# Patient Record
Sex: Female | Born: 1955 | Race: Black or African American | Hispanic: No | Smoking: Current every day smoker
Health system: Southern US, Community
[De-identification: ages and names within clinical notes are randomized; demographics above are authoritative.]

## PROBLEM LIST (undated history)

## (undated) DIAGNOSIS — I82409 Acute embolism and thrombosis of unspecified deep veins of unspecified lower extremity: Secondary | ICD-10-CM

## (undated) DIAGNOSIS — M549 Dorsalgia, unspecified: Secondary | ICD-10-CM

## (undated) DIAGNOSIS — I493 Ventricular premature depolarization: Secondary | ICD-10-CM

## (undated) DIAGNOSIS — F329 Major depressive disorder, single episode, unspecified: Secondary | ICD-10-CM

## (undated) DIAGNOSIS — I639 Cerebral infarction, unspecified: Secondary | ICD-10-CM

## (undated) DIAGNOSIS — D259 Leiomyoma of uterus, unspecified: Secondary | ICD-10-CM

## (undated) DIAGNOSIS — F32A Depression, unspecified: Secondary | ICD-10-CM

## (undated) DIAGNOSIS — M1990 Unspecified osteoarthritis, unspecified site: Secondary | ICD-10-CM

## (undated) DIAGNOSIS — Z720 Tobacco use: Secondary | ICD-10-CM

## (undated) DIAGNOSIS — N83209 Unspecified ovarian cyst, unspecified side: Secondary | ICD-10-CM

## (undated) DIAGNOSIS — G8929 Other chronic pain: Secondary | ICD-10-CM

## (undated) DIAGNOSIS — F419 Anxiety disorder, unspecified: Secondary | ICD-10-CM

## (undated) DIAGNOSIS — I2699 Other pulmonary embolism without acute cor pulmonale: Secondary | ICD-10-CM

## (undated) DIAGNOSIS — K219 Gastro-esophageal reflux disease without esophagitis: Secondary | ICD-10-CM

## (undated) DIAGNOSIS — I10 Essential (primary) hypertension: Secondary | ICD-10-CM

## (undated) DIAGNOSIS — G4733 Obstructive sleep apnea (adult) (pediatric): Secondary | ICD-10-CM

## (undated) DIAGNOSIS — I350 Nonrheumatic aortic (valve) stenosis: Secondary | ICD-10-CM

## (undated) DIAGNOSIS — G43909 Migraine, unspecified, not intractable, without status migrainosus: Secondary | ICD-10-CM

## (undated) DIAGNOSIS — J449 Chronic obstructive pulmonary disease, unspecified: Secondary | ICD-10-CM

## (undated) DIAGNOSIS — C189 Malignant neoplasm of colon, unspecified: Secondary | ICD-10-CM

## (undated) DIAGNOSIS — E785 Hyperlipidemia, unspecified: Secondary | ICD-10-CM

## (undated) HISTORY — DX: Major depressive disorder, single episode, unspecified: F32.9

## (undated) HISTORY — PX: CARPAL TUNNEL RELEASE: SHX101

## (undated) HISTORY — PX: MOUTH SURGERY: SHX715

## (undated) HISTORY — DX: Obstructive sleep apnea (adult) (pediatric): G47.33

## (undated) HISTORY — DX: Leiomyoma of uterus, unspecified: D25.9

## (undated) HISTORY — DX: Unspecified ovarian cyst, unspecified side: N83.209

## (undated) HISTORY — PX: SUBTOTAL COLECTOMY: SHX855

## (undated) HISTORY — PX: COLONOSCOPY: SHX174

## (undated) HISTORY — DX: Hyperlipidemia, unspecified: E78.5

## (undated) HISTORY — DX: Depression, unspecified: F32.A

## (undated) HISTORY — PX: KNEE ARTHROSCOPY: SUR90

## (undated) HISTORY — PX: TUBAL LIGATION: SHX77

## (undated) HISTORY — DX: Migraine, unspecified, not intractable, without status migrainosus: G43.909

## (undated) HISTORY — DX: Gastro-esophageal reflux disease without esophagitis: K21.9

## (undated) HISTORY — DX: Morbid (severe) obesity due to excess calories: E66.01

## (undated) HISTORY — DX: Unspecified osteoarthritis, unspecified site: M19.90

---

## 1997-11-20 ENCOUNTER — Emergency Department (HOSPITAL_COMMUNITY): Admission: EM | Admit: 1997-11-20 | Discharge: 1997-11-20 | Payer: Self-pay | Admitting: Emergency Medicine

## 1997-12-25 ENCOUNTER — Encounter: Admission: RE | Admit: 1997-12-25 | Discharge: 1997-12-25 | Payer: Self-pay | Admitting: Internal Medicine

## 1998-01-09 ENCOUNTER — Encounter: Admission: RE | Admit: 1998-01-09 | Discharge: 1998-01-09 | Payer: Self-pay | Admitting: Internal Medicine

## 1998-04-09 ENCOUNTER — Emergency Department (HOSPITAL_COMMUNITY): Admission: EM | Admit: 1998-04-09 | Discharge: 1998-04-09 | Payer: Self-pay | Admitting: Emergency Medicine

## 1998-04-10 ENCOUNTER — Encounter: Payer: Self-pay | Admitting: Emergency Medicine

## 1999-05-26 ENCOUNTER — Ambulatory Visit: Admission: RE | Admit: 1999-05-26 | Discharge: 1999-05-26 | Payer: Self-pay | Admitting: Emergency Medicine

## 1999-05-29 ENCOUNTER — Ambulatory Visit (HOSPITAL_COMMUNITY): Admission: RE | Admit: 1999-05-29 | Discharge: 1999-05-29 | Payer: Self-pay | Admitting: Gastroenterology

## 2000-03-22 ENCOUNTER — Emergency Department (HOSPITAL_COMMUNITY): Admission: EM | Admit: 2000-03-22 | Discharge: 2000-03-22 | Payer: Self-pay | Admitting: Emergency Medicine

## 2000-04-23 ENCOUNTER — Emergency Department (HOSPITAL_COMMUNITY): Admission: EM | Admit: 2000-04-23 | Discharge: 2000-04-23 | Payer: Self-pay | Admitting: *Deleted

## 2000-08-23 ENCOUNTER — Encounter: Payer: Self-pay | Admitting: Otolaryngology

## 2000-08-23 ENCOUNTER — Ambulatory Visit (HOSPITAL_COMMUNITY): Admission: RE | Admit: 2000-08-23 | Discharge: 2000-08-23 | Payer: Self-pay | Admitting: Otolaryngology

## 2001-03-25 ENCOUNTER — Encounter: Payer: Self-pay | Admitting: Emergency Medicine

## 2001-03-25 ENCOUNTER — Emergency Department (HOSPITAL_COMMUNITY): Admission: EM | Admit: 2001-03-25 | Discharge: 2001-03-25 | Payer: Self-pay | Admitting: Emergency Medicine

## 2001-04-04 ENCOUNTER — Emergency Department (HOSPITAL_COMMUNITY): Admission: EM | Admit: 2001-04-04 | Discharge: 2001-04-04 | Payer: Self-pay | Admitting: Emergency Medicine

## 2001-04-04 ENCOUNTER — Encounter: Payer: Self-pay | Admitting: Emergency Medicine

## 2001-06-17 ENCOUNTER — Emergency Department (HOSPITAL_COMMUNITY): Admission: EM | Admit: 2001-06-17 | Discharge: 2001-06-17 | Payer: Self-pay | Admitting: *Deleted

## 2001-06-29 ENCOUNTER — Emergency Department (HOSPITAL_COMMUNITY): Admission: EM | Admit: 2001-06-29 | Discharge: 2001-06-29 | Payer: Self-pay | Admitting: *Deleted

## 2001-11-13 ENCOUNTER — Inpatient Hospital Stay (HOSPITAL_COMMUNITY): Admission: AD | Admit: 2001-11-13 | Discharge: 2001-11-13 | Payer: Self-pay | Admitting: *Deleted

## 2001-11-24 ENCOUNTER — Emergency Department (HOSPITAL_COMMUNITY): Admission: EM | Admit: 2001-11-24 | Discharge: 2001-11-24 | Payer: Self-pay | Admitting: Emergency Medicine

## 2001-12-04 ENCOUNTER — Emergency Department (HOSPITAL_COMMUNITY): Admission: EM | Admit: 2001-12-04 | Discharge: 2001-12-04 | Payer: Self-pay | Admitting: Emergency Medicine

## 2001-12-23 ENCOUNTER — Encounter: Admission: RE | Admit: 2001-12-23 | Discharge: 2001-12-23 | Payer: Self-pay | Admitting: Gastroenterology

## 2001-12-23 ENCOUNTER — Encounter: Payer: Self-pay | Admitting: Gastroenterology

## 2001-12-29 ENCOUNTER — Encounter (INDEPENDENT_AMBULATORY_CARE_PROVIDER_SITE_OTHER): Payer: Self-pay | Admitting: *Deleted

## 2001-12-29 ENCOUNTER — Ambulatory Visit (HOSPITAL_COMMUNITY): Admission: RE | Admit: 2001-12-29 | Discharge: 2001-12-29 | Payer: Self-pay | Admitting: Gastroenterology

## 2002-01-13 ENCOUNTER — Encounter: Admission: RE | Admit: 2002-01-13 | Discharge: 2002-01-13 | Payer: Self-pay | Admitting: Internal Medicine

## 2002-01-13 ENCOUNTER — Encounter: Payer: Self-pay | Admitting: Internal Medicine

## 2002-03-27 ENCOUNTER — Encounter: Payer: Self-pay | Admitting: *Deleted

## 2002-03-27 ENCOUNTER — Emergency Department (HOSPITAL_COMMUNITY): Admission: EM | Admit: 2002-03-27 | Discharge: 2002-03-27 | Payer: Self-pay | Admitting: *Deleted

## 2002-10-15 ENCOUNTER — Encounter: Payer: Self-pay | Admitting: Emergency Medicine

## 2002-10-15 ENCOUNTER — Emergency Department (HOSPITAL_COMMUNITY): Admission: EM | Admit: 2002-10-15 | Discharge: 2002-10-15 | Payer: Self-pay | Admitting: Emergency Medicine

## 2003-03-22 ENCOUNTER — Encounter: Payer: Self-pay | Admitting: Gastroenterology

## 2003-03-22 ENCOUNTER — Encounter: Admission: RE | Admit: 2003-03-22 | Discharge: 2003-03-22 | Payer: Self-pay | Admitting: Gastroenterology

## 2003-04-05 ENCOUNTER — Encounter: Admission: RE | Admit: 2003-04-05 | Discharge: 2003-04-05 | Payer: Self-pay | Admitting: Gastroenterology

## 2003-04-11 ENCOUNTER — Other Ambulatory Visit: Admission: RE | Admit: 2003-04-11 | Discharge: 2003-04-11 | Payer: Self-pay | Admitting: Obstetrics and Gynecology

## 2003-10-30 ENCOUNTER — Ambulatory Visit (HOSPITAL_BASED_OUTPATIENT_CLINIC_OR_DEPARTMENT_OTHER): Admission: RE | Admit: 2003-10-30 | Discharge: 2003-10-30 | Payer: Self-pay | Admitting: Emergency Medicine

## 2004-01-14 ENCOUNTER — Ambulatory Visit (HOSPITAL_BASED_OUTPATIENT_CLINIC_OR_DEPARTMENT_OTHER): Admission: RE | Admit: 2004-01-14 | Discharge: 2004-01-14 | Payer: Self-pay | Admitting: Otolaryngology

## 2004-05-24 ENCOUNTER — Emergency Department (HOSPITAL_COMMUNITY): Admission: EM | Admit: 2004-05-24 | Discharge: 2004-05-25 | Payer: Self-pay | Admitting: Emergency Medicine

## 2004-06-12 ENCOUNTER — Ambulatory Visit (HOSPITAL_BASED_OUTPATIENT_CLINIC_OR_DEPARTMENT_OTHER): Admission: RE | Admit: 2004-06-12 | Discharge: 2004-06-12 | Payer: Self-pay | Admitting: Specialist

## 2004-06-12 ENCOUNTER — Ambulatory Visit (HOSPITAL_COMMUNITY): Admission: RE | Admit: 2004-06-12 | Discharge: 2004-06-12 | Payer: Self-pay | Admitting: Specialist

## 2004-06-18 ENCOUNTER — Ambulatory Visit (HOSPITAL_COMMUNITY): Admission: RE | Admit: 2004-06-18 | Discharge: 2004-06-18 | Payer: Self-pay | Admitting: Specialist

## 2004-11-14 ENCOUNTER — Emergency Department (HOSPITAL_COMMUNITY): Admission: EM | Admit: 2004-11-14 | Discharge: 2004-11-14 | Payer: Self-pay | Admitting: Emergency Medicine

## 2004-11-27 ENCOUNTER — Observation Stay (HOSPITAL_COMMUNITY): Admission: EM | Admit: 2004-11-27 | Discharge: 2004-11-28 | Payer: Self-pay | Admitting: Emergency Medicine

## 2004-12-25 ENCOUNTER — Encounter: Payer: Self-pay | Admitting: Cardiology

## 2004-12-25 ENCOUNTER — Ambulatory Visit (HOSPITAL_COMMUNITY): Admission: RE | Admit: 2004-12-25 | Discharge: 2004-12-25 | Payer: Self-pay | Admitting: Internal Medicine

## 2004-12-25 ENCOUNTER — Ambulatory Visit: Payer: Self-pay | Admitting: Cardiology

## 2005-02-16 ENCOUNTER — Inpatient Hospital Stay (HOSPITAL_COMMUNITY): Admission: EM | Admit: 2005-02-16 | Discharge: 2005-02-22 | Payer: Self-pay | Admitting: Emergency Medicine

## 2005-02-23 ENCOUNTER — Emergency Department (HOSPITAL_COMMUNITY): Admission: EM | Admit: 2005-02-23 | Discharge: 2005-02-23 | Payer: Self-pay | Admitting: Emergency Medicine

## 2005-04-29 ENCOUNTER — Inpatient Hospital Stay (HOSPITAL_COMMUNITY): Admission: AD | Admit: 2005-04-29 | Discharge: 2005-05-03 | Payer: Self-pay | Admitting: Internal Medicine

## 2005-05-24 ENCOUNTER — Emergency Department (HOSPITAL_COMMUNITY): Admission: EM | Admit: 2005-05-24 | Discharge: 2005-05-24 | Payer: Self-pay | Admitting: Emergency Medicine

## 2005-07-05 ENCOUNTER — Emergency Department (HOSPITAL_COMMUNITY): Admission: EM | Admit: 2005-07-05 | Discharge: 2005-07-05 | Payer: Self-pay | Admitting: Emergency Medicine

## 2005-07-25 ENCOUNTER — Emergency Department (HOSPITAL_COMMUNITY): Admission: EM | Admit: 2005-07-25 | Discharge: 2005-07-25 | Payer: Self-pay | Admitting: Emergency Medicine

## 2005-08-10 ENCOUNTER — Emergency Department (HOSPITAL_COMMUNITY): Admission: EM | Admit: 2005-08-10 | Discharge: 2005-08-10 | Payer: Self-pay | Admitting: Emergency Medicine

## 2005-10-11 ENCOUNTER — Encounter (INDEPENDENT_AMBULATORY_CARE_PROVIDER_SITE_OTHER): Payer: Self-pay | Admitting: *Deleted

## 2005-10-11 ENCOUNTER — Emergency Department (HOSPITAL_COMMUNITY): Admission: EM | Admit: 2005-10-11 | Discharge: 2005-10-11 | Payer: Self-pay | Admitting: Emergency Medicine

## 2005-10-18 ENCOUNTER — Emergency Department (HOSPITAL_COMMUNITY): Admission: EM | Admit: 2005-10-18 | Discharge: 2005-10-18 | Payer: Self-pay | Admitting: Emergency Medicine

## 2006-02-03 IMAGING — CT CT ABDOMEN W/O CM
1 of 2 series · 15 of 32 positions shown, 19 images · non-contrast
Comparison: none

CLINICAL DATA: Low back pain. Colon carcinoma status post colectomy.

[Series 2: urogram 5.0 b30f · axial · 0.69mm/px · z∈[-462,-86]mm · 15 of 104 slices shown, 19 images]
[im 5/104  soft-tissue]
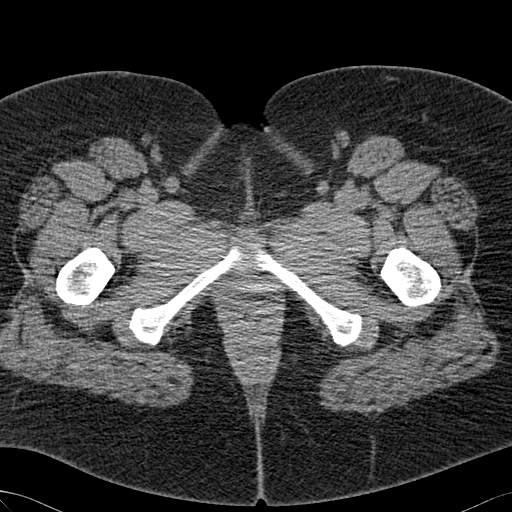
[im 5/104  bone]
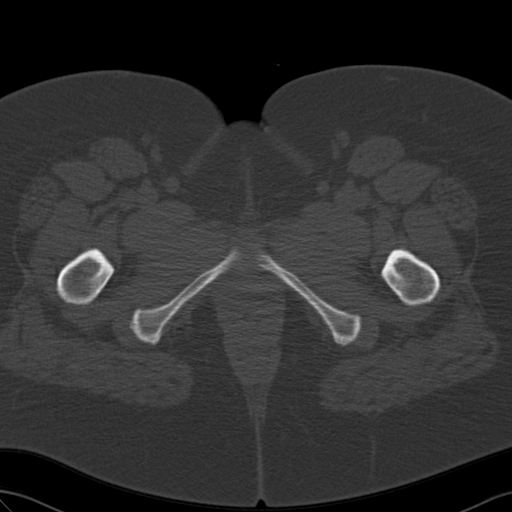
[im 13/104  soft-tissue]
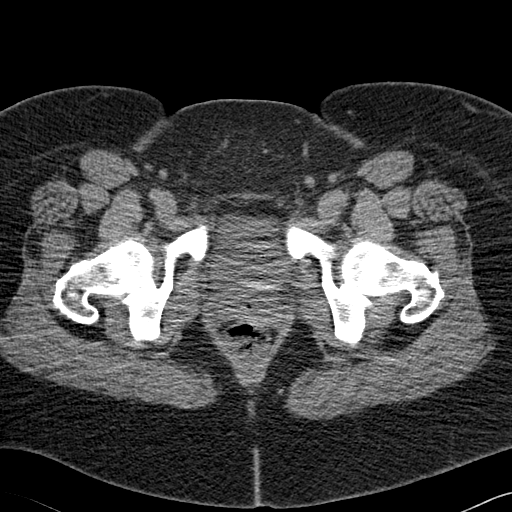
[im 21/104  soft-tissue]
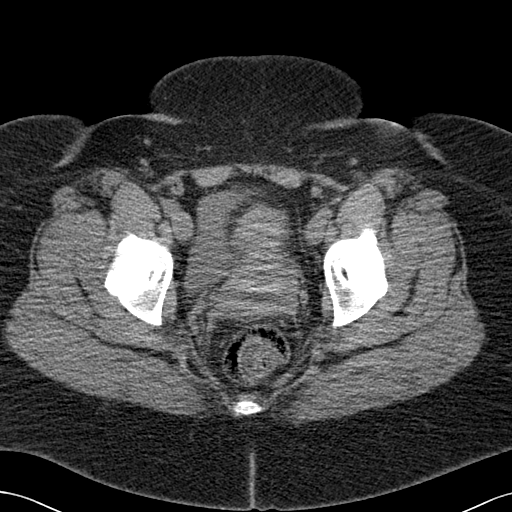
[im 29/104  soft-tissue]
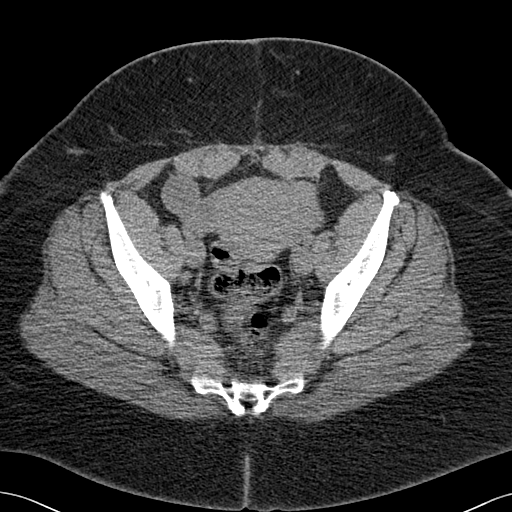
[im 38/104  soft-tissue]
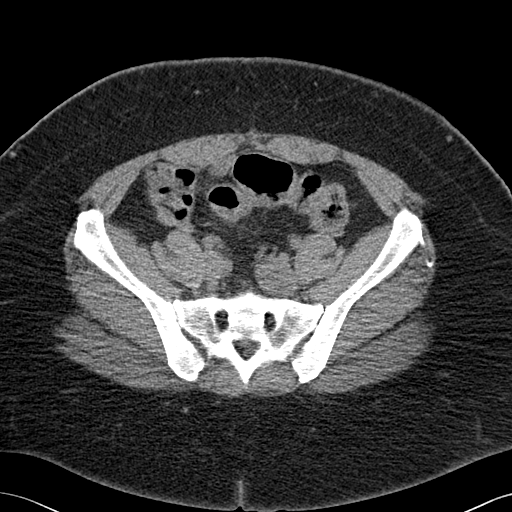
[im 46/104  soft-tissue]
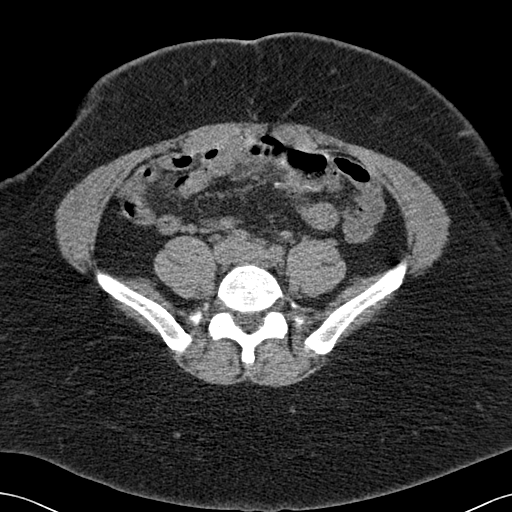
[im 54/104  soft-tissue]
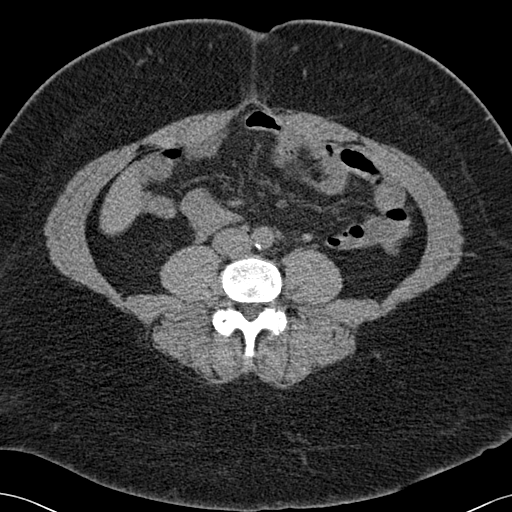
[im 58/104  soft-tissue]
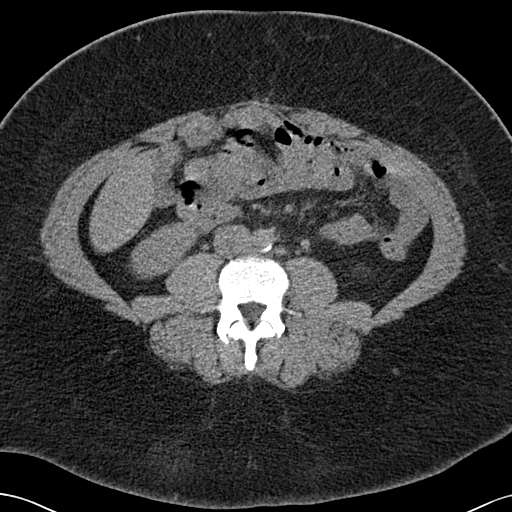
[im 66/104  soft-tissue]
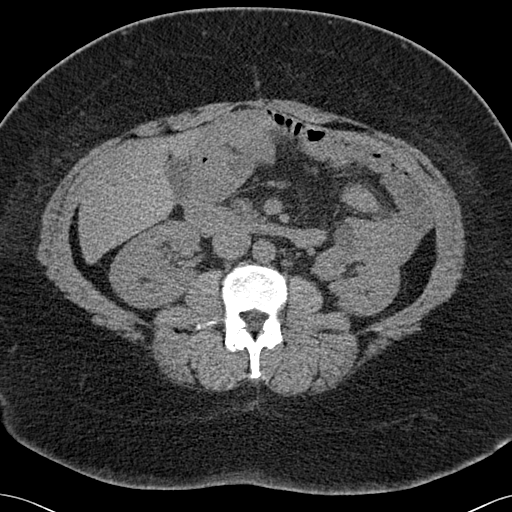
[im 66/104  bone]
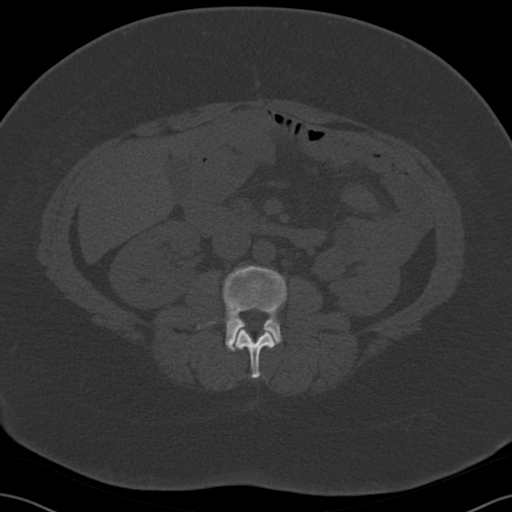
[im 75/104  soft-tissue]
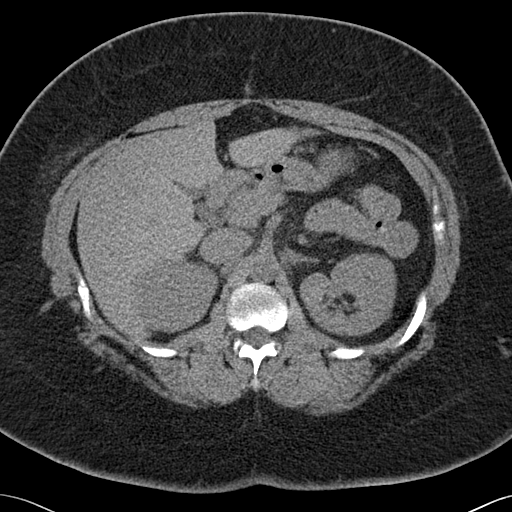
[im 83/104  soft-tissue]
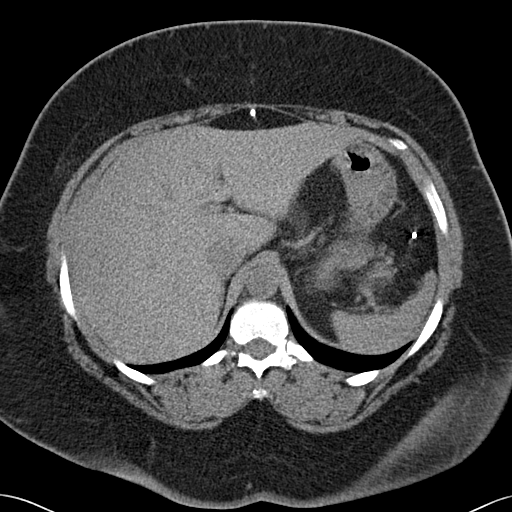
[im 87/104  lung]
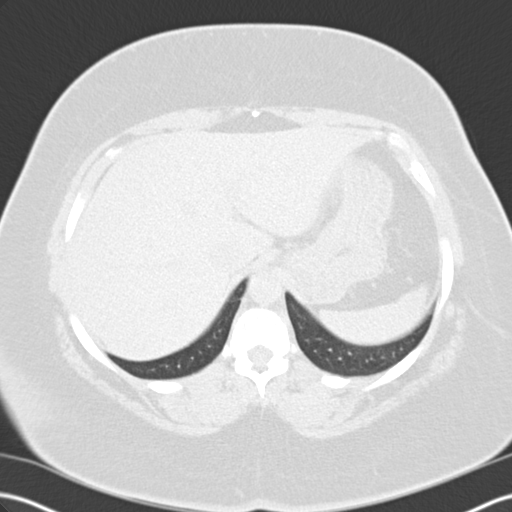
[im 91/104  soft-tissue]
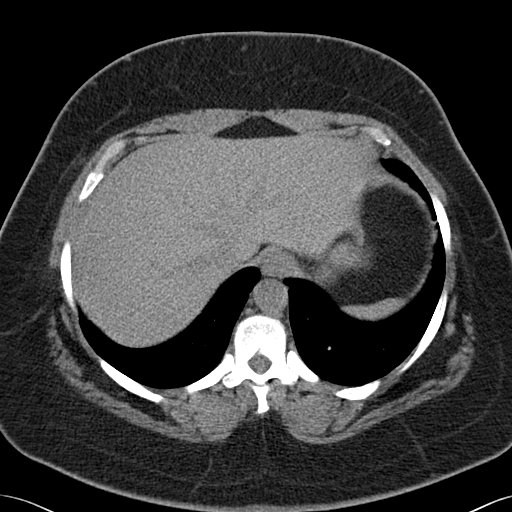
[im 91/104  lung]
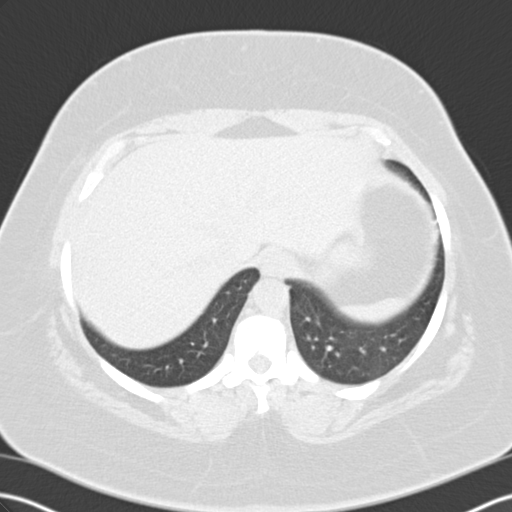
[im 95/104  lung]
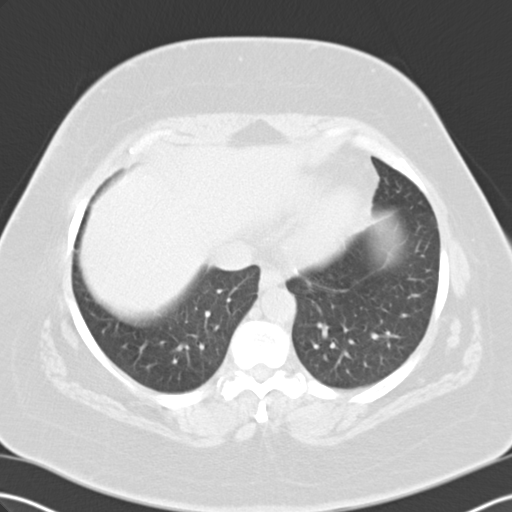
[im 99/104  soft-tissue]
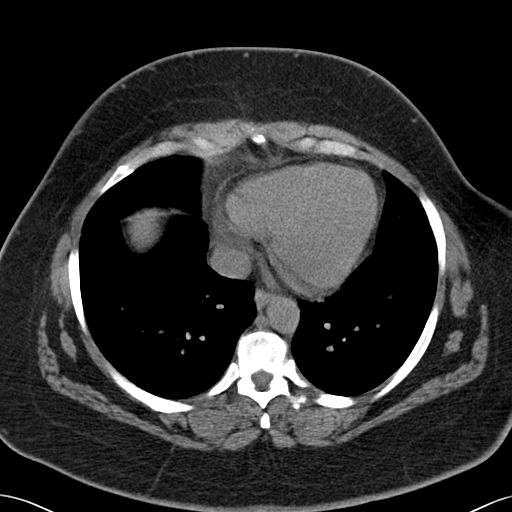
[im 99/104  lung]
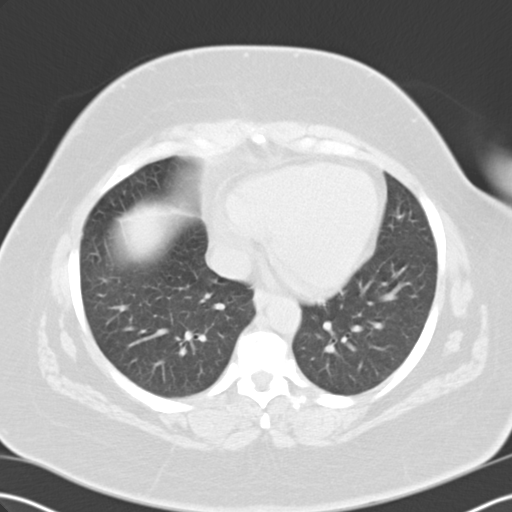

[15 of 32 positions shown; findings below may reference images not displayed]

CT abdomen without contrast:

Previous study is not currently available for comparison. Visualized portions of
lung bases clear. Unremarkable noncontrast evaluation of liver, gallbladder,
spleen, right kidney, adrenal glands, pancreas, small bowel. Low attenuation
mass from the lower pole of the left kidney possibly cyst but incompletely
evaluated. Vascular clips noted in the left upper abdomen. No free air. No
ascites. Patchy atheromatous changes in the infrarenal abdominal aorta without
aneurysm. Degenerative changes in the lumbar spine.
IMPRESSION: 1. Negative for nephrolithiasis or hydronephrosis.
2. Lumbar spine degenerative changes.
3. Otherwise Unremarkable CT abdomen.

CT pelvis with contrast:

Distal ureters decompressed without calculus. Patient's body habitus results in
some image degradation. Urinary bladder incompletely distended. Uterus  
unremarkable. Small calcifications project in the region of the left ovary.
There is a 3.8 cm slightly low attenuation area in the right adnexal region and
presence of an ovarian cyst can't be entirely excluded. Changes of partial
colectomy are evident. Negative for mass or adenopathy.
IMPRESSION: 1. Negative for distal ureteral calculus.
2. Postoperative changes as above.
3. Possible right ovarian cyst. Consider followup ultrasound after 2 menstrual
cycles to confirm appropriate resolution.

## 2006-02-15 ENCOUNTER — Inpatient Hospital Stay (HOSPITAL_COMMUNITY): Admission: AD | Admit: 2006-02-15 | Discharge: 2006-02-15 | Payer: Self-pay | Admitting: Obstetrics and Gynecology

## 2006-02-27 ENCOUNTER — Ambulatory Visit (HOSPITAL_COMMUNITY): Admission: RE | Admit: 2006-02-27 | Discharge: 2006-02-27 | Payer: Self-pay | Admitting: Obstetrics

## 2006-03-16 ENCOUNTER — Ambulatory Visit: Payer: Self-pay | Admitting: Gastroenterology

## 2006-03-18 ENCOUNTER — Ambulatory Visit (HOSPITAL_COMMUNITY): Admission: RE | Admit: 2006-03-18 | Discharge: 2006-03-18 | Payer: Self-pay | Admitting: Gastroenterology

## 2006-03-23 ENCOUNTER — Ambulatory Visit: Payer: Self-pay | Admitting: Gastroenterology

## 2006-05-07 ENCOUNTER — Emergency Department (HOSPITAL_COMMUNITY): Admission: EM | Admit: 2006-05-07 | Discharge: 2006-05-07 | Payer: Self-pay | Admitting: Emergency Medicine

## 2006-07-01 ENCOUNTER — Inpatient Hospital Stay (HOSPITAL_COMMUNITY): Admission: AD | Admit: 2006-07-01 | Discharge: 2006-07-01 | Payer: Self-pay | Admitting: Obstetrics & Gynecology

## 2006-07-09 ENCOUNTER — Encounter: Admission: RE | Admit: 2006-07-09 | Discharge: 2006-07-09 | Payer: Self-pay | Admitting: Obstetrics

## 2006-07-17 ENCOUNTER — Emergency Department (HOSPITAL_COMMUNITY): Admission: EM | Admit: 2006-07-17 | Discharge: 2006-07-17 | Payer: Self-pay | Admitting: Emergency Medicine

## 2006-07-26 IMAGING — CR DG CHEST 2V
2 series · 2 of 2 positions shown · non-contrast
Comparison: None.

CLINICAL DATA: Chest pain and shortness of breath.  History of chronic bronchitis.
 CHEST - 2 VIEW:

[w chest pa]
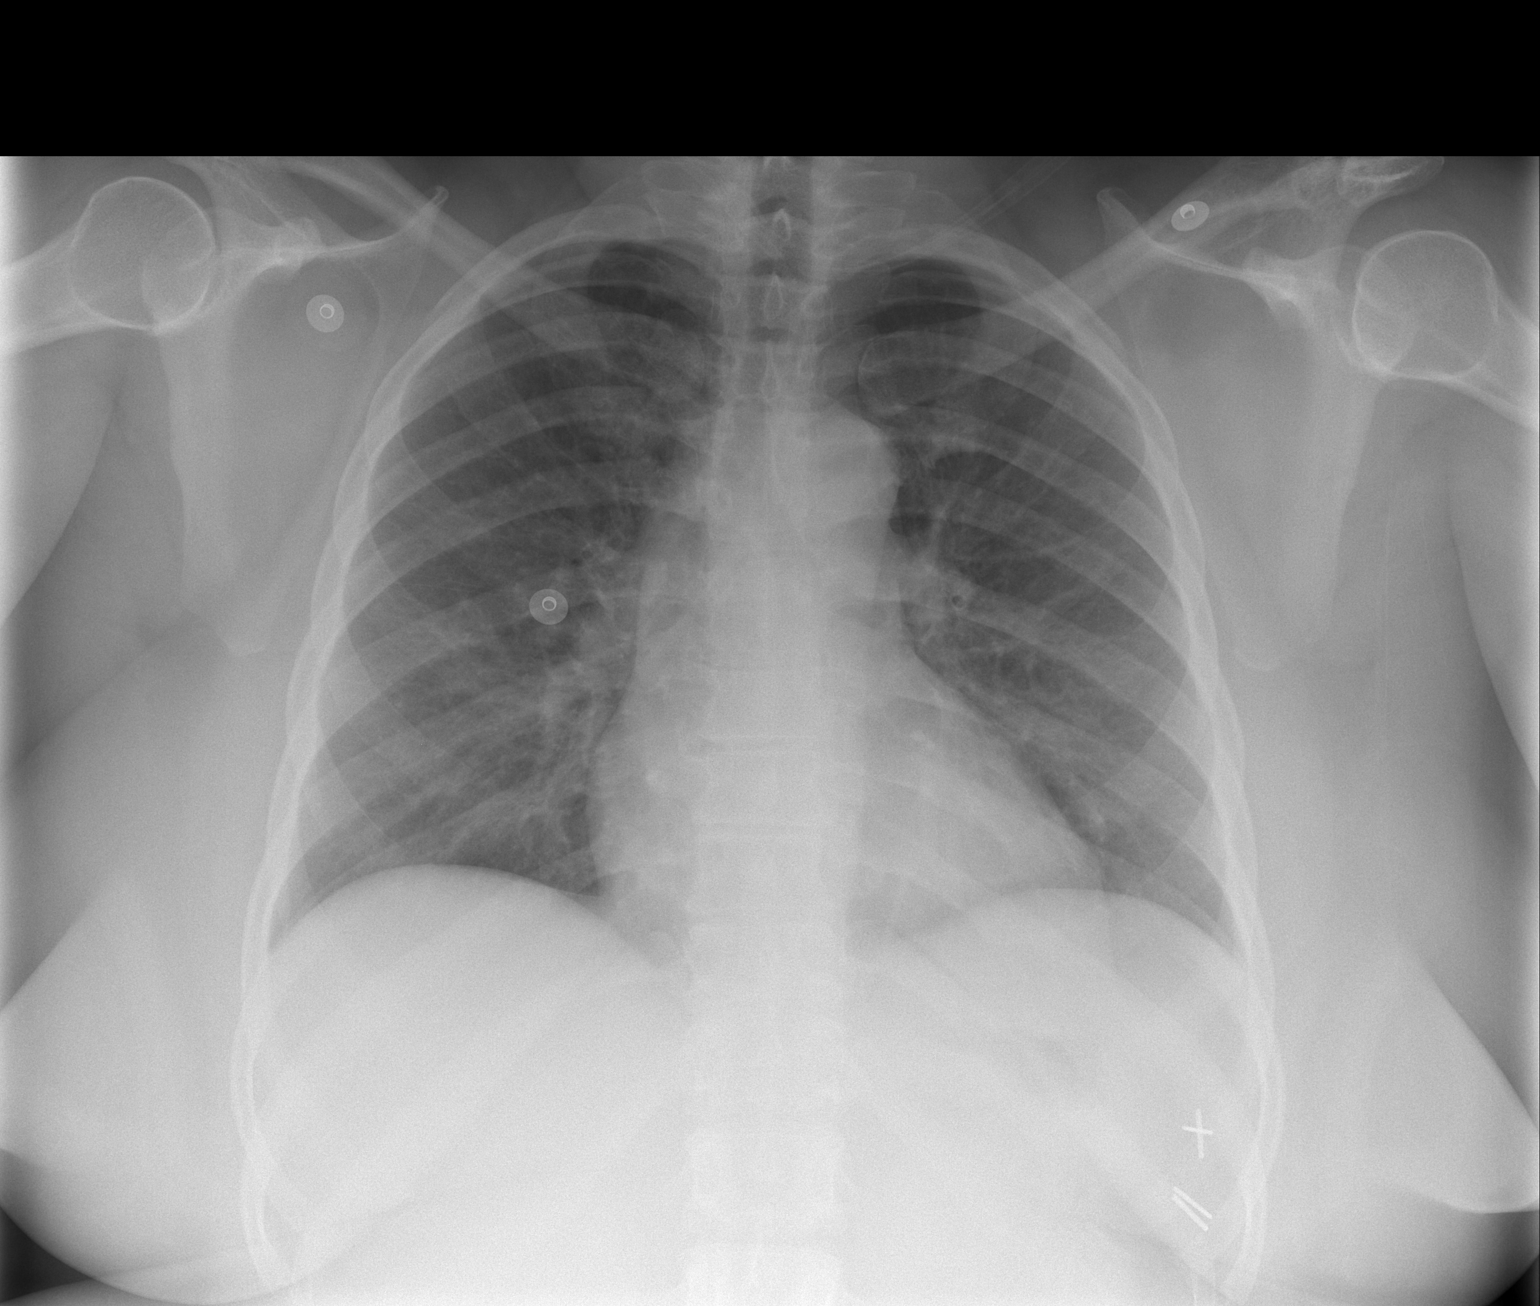

[w chest lat]
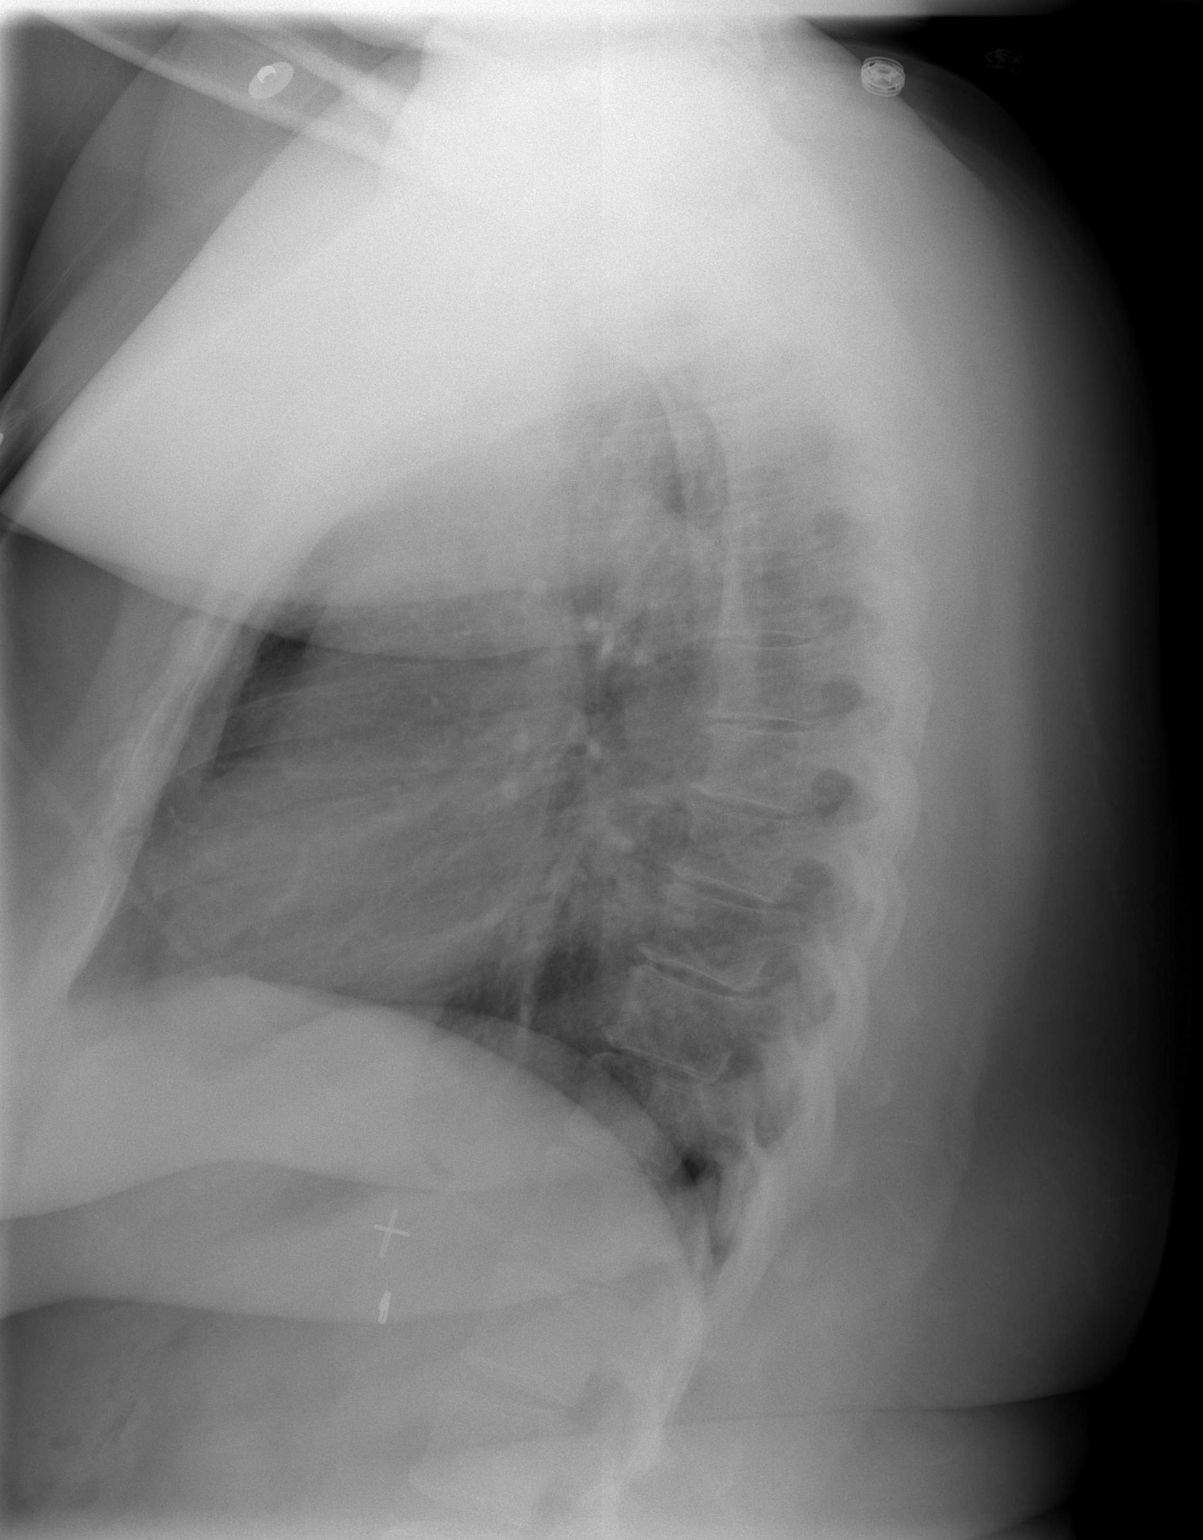

[2 of 2 positions shown; findings below may reference images not displayed]

FINDINGS: The heart and vascularity are normal.  Mild peribronchial thickening without acute air space disease or pleural fluid.
IMPRESSION: Peribronchial thickening ? No active disease.

## 2006-08-05 ENCOUNTER — Emergency Department (HOSPITAL_COMMUNITY): Admission: EM | Admit: 2006-08-05 | Discharge: 2006-08-06 | Payer: Self-pay | Admitting: Emergency Medicine

## 2006-08-07 IMAGING — CR DG CHEST 2V
2 series · 2 of 2 positions shown · non-contrast
Comparison: none

CLINICAL DATA: Chest pain

Chest 2 view:
Comparison 11/14/2004. Bilateral perihilar and bibasilar mild interstitial
infiltrates or edema, increased since previous exam. No effusion. Heart size
upper limits normal. Minimal spurring of thoracic spine. Vascular clips project
in the left upper abdomen.

[view not recorded (1 of 2)]
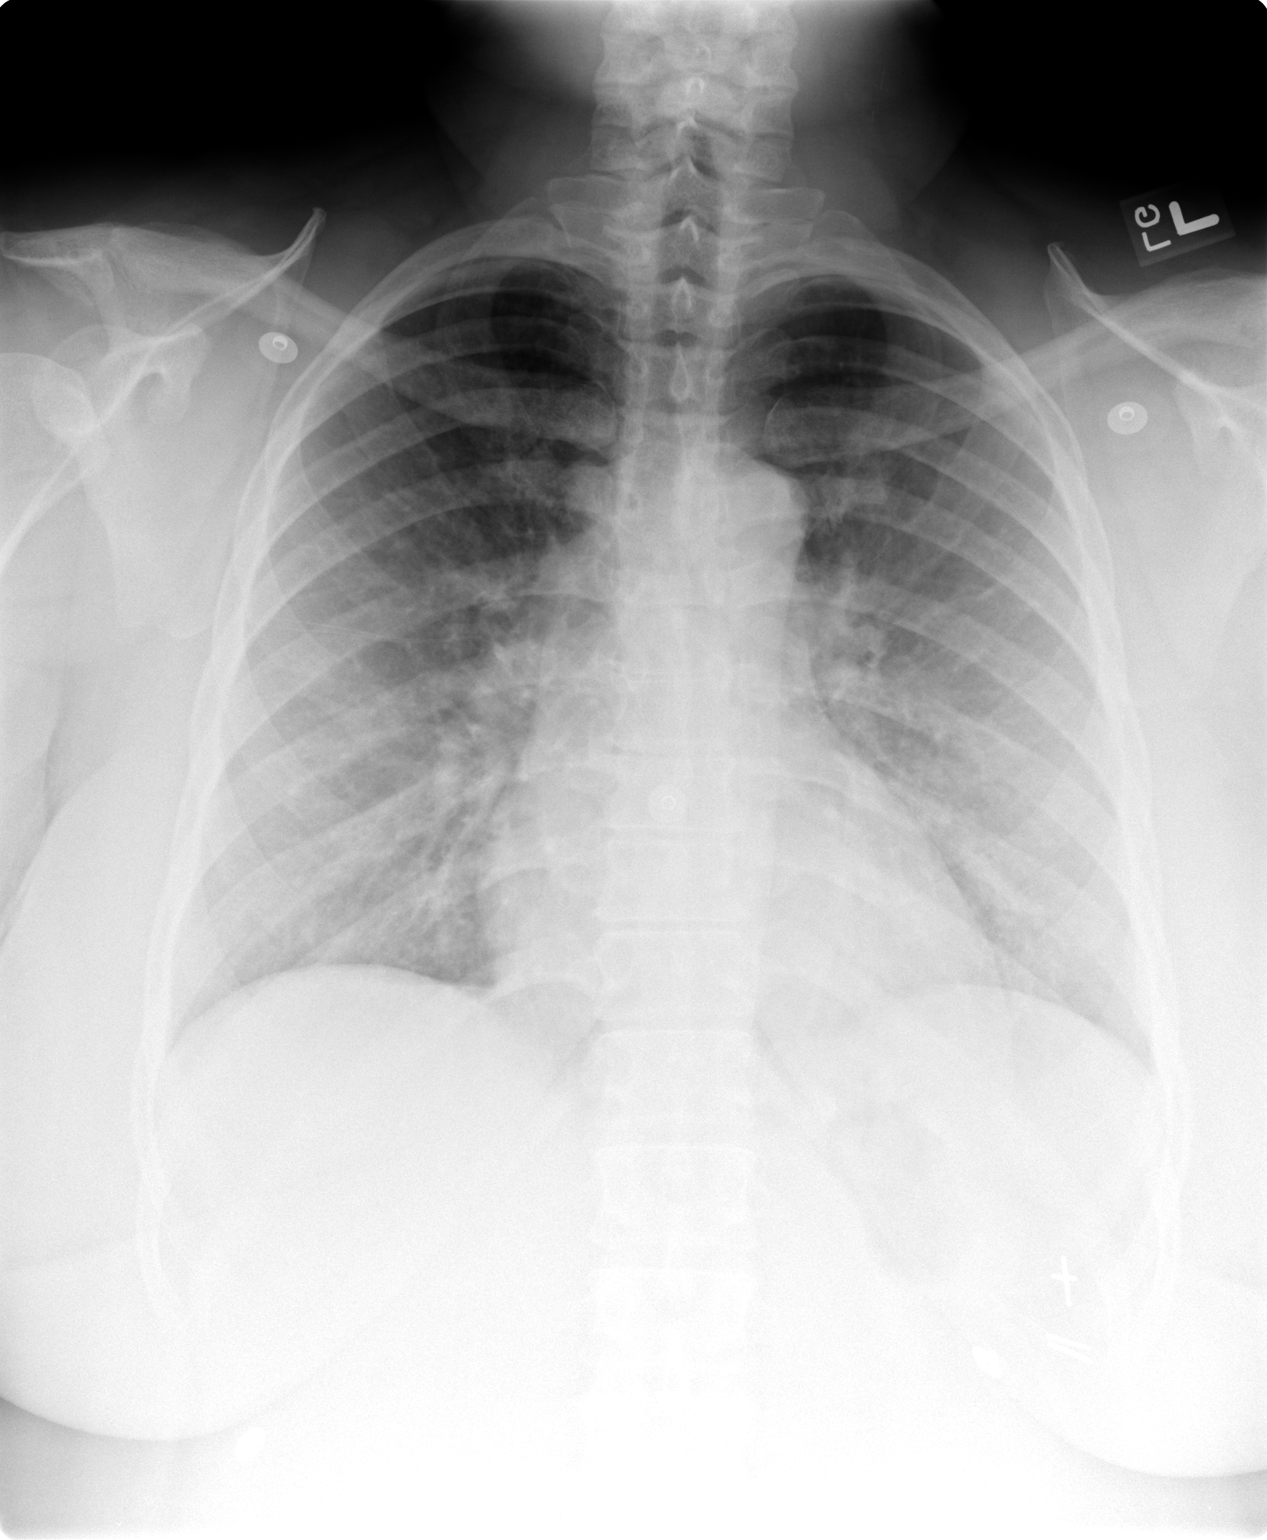

[view not recorded (2 of 2)]
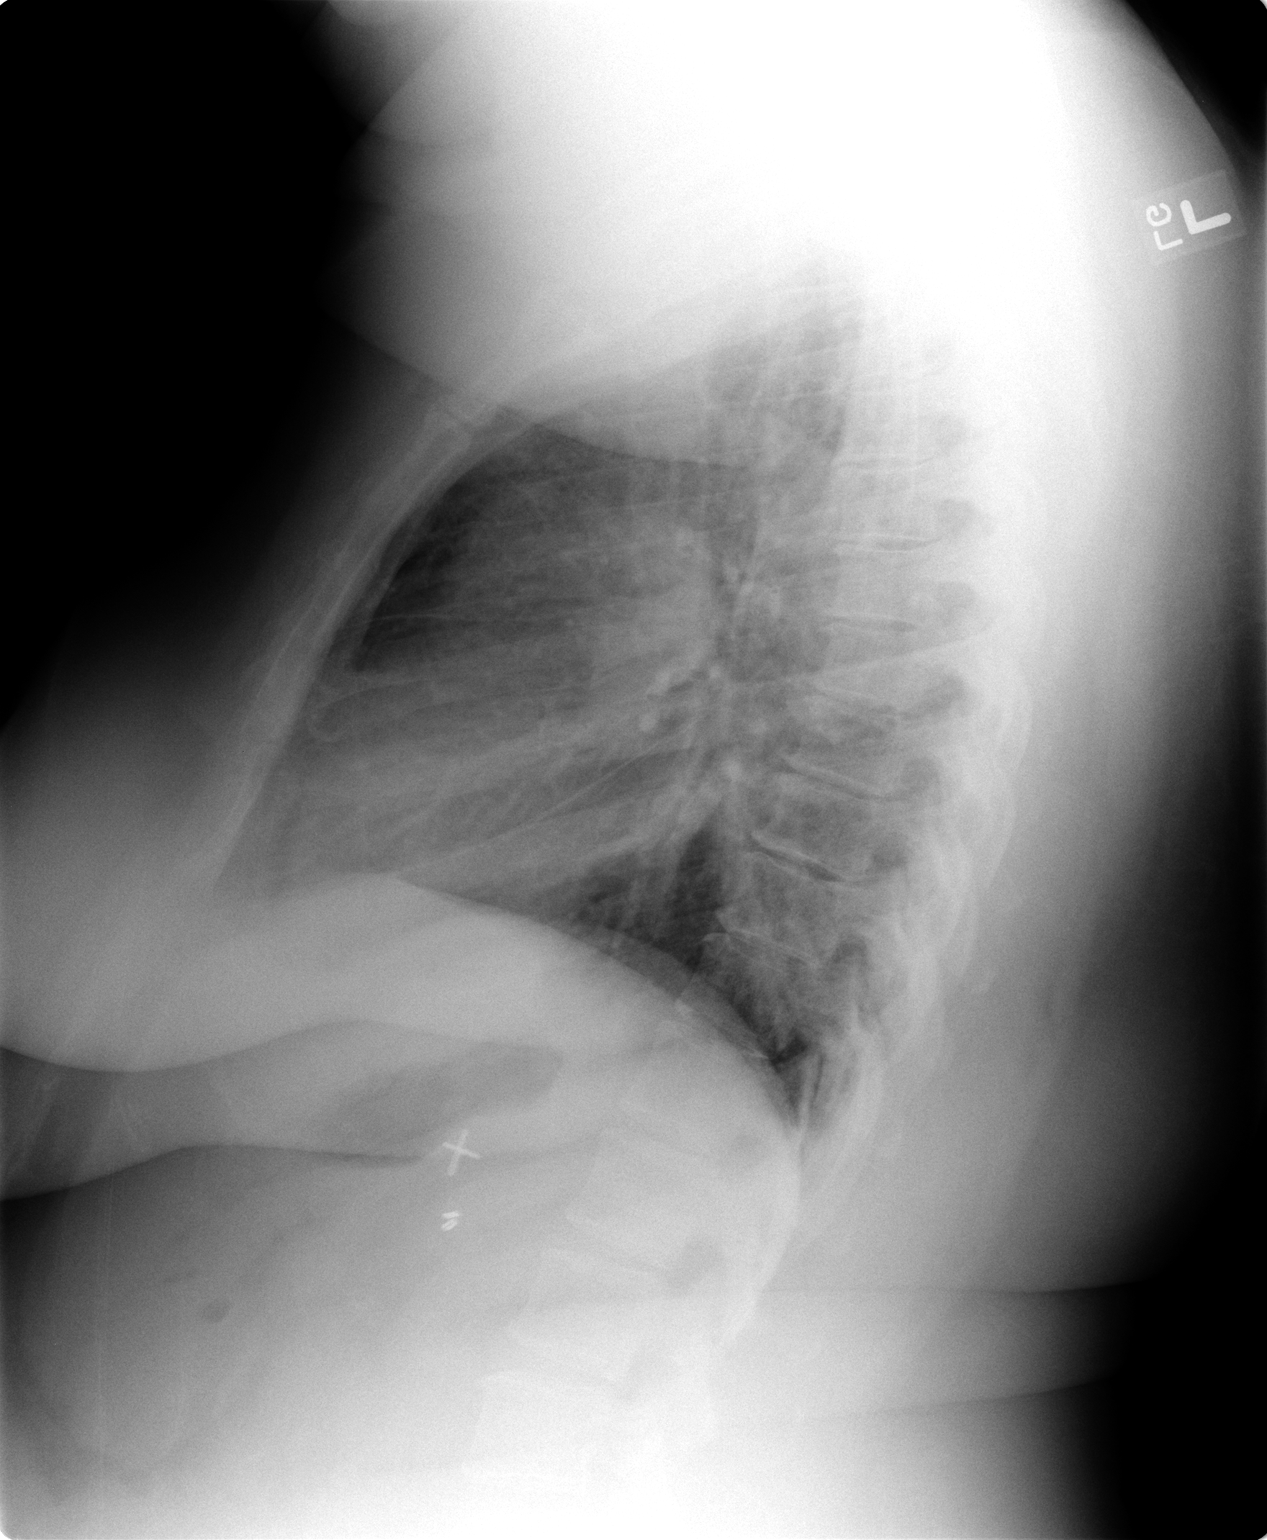

[2 of 2 positions shown; findings below may reference images not displayed]

IMPRESSION: 1. Mild perihilar and bibasilar interstitial edema or infiltrates, new since
11/14/2004.

## 2006-08-08 IMAGING — CR DG CHEST 2V
2 series · 2 of 2 positions shown · non-contrast
Comparison: 11/26/04.

CLINICAL DATA: Chest pain.  Perihilar edema. 
 CHEST - 2 VIEW:

[view not recorded (1 of 2)]
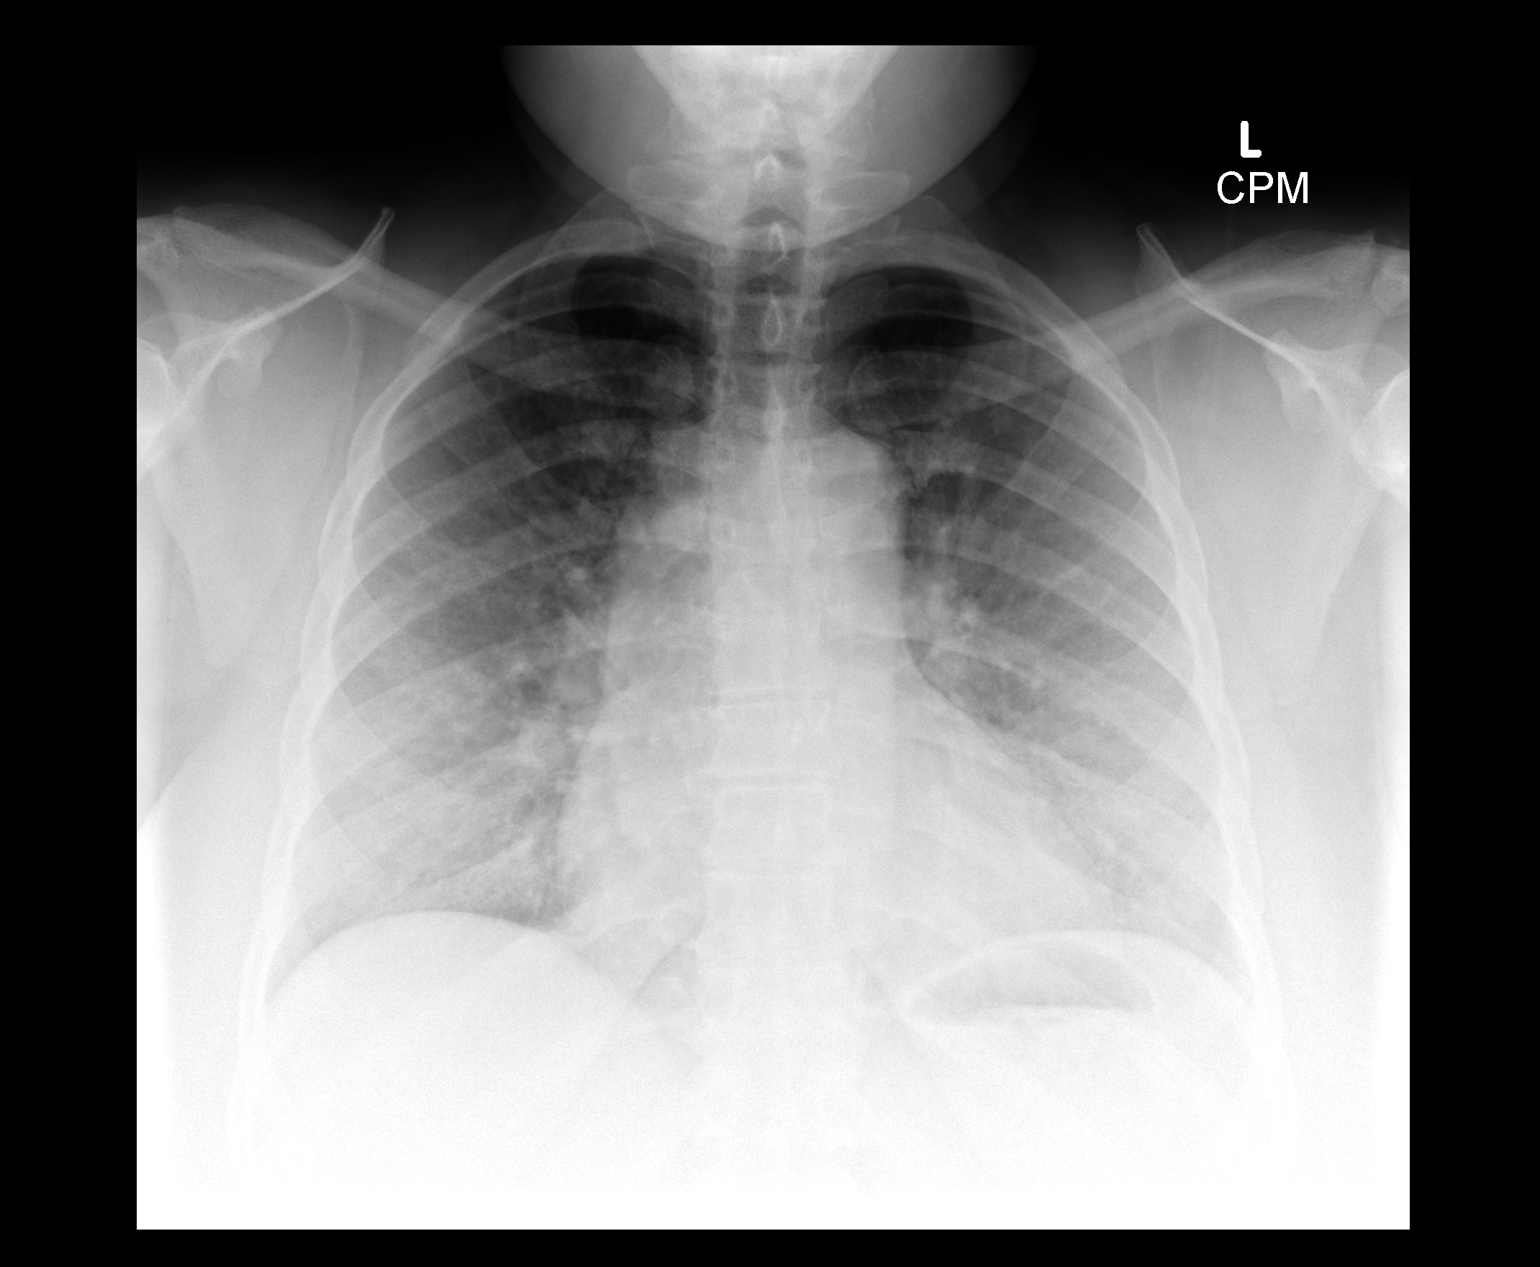

[view not recorded (2 of 2)]
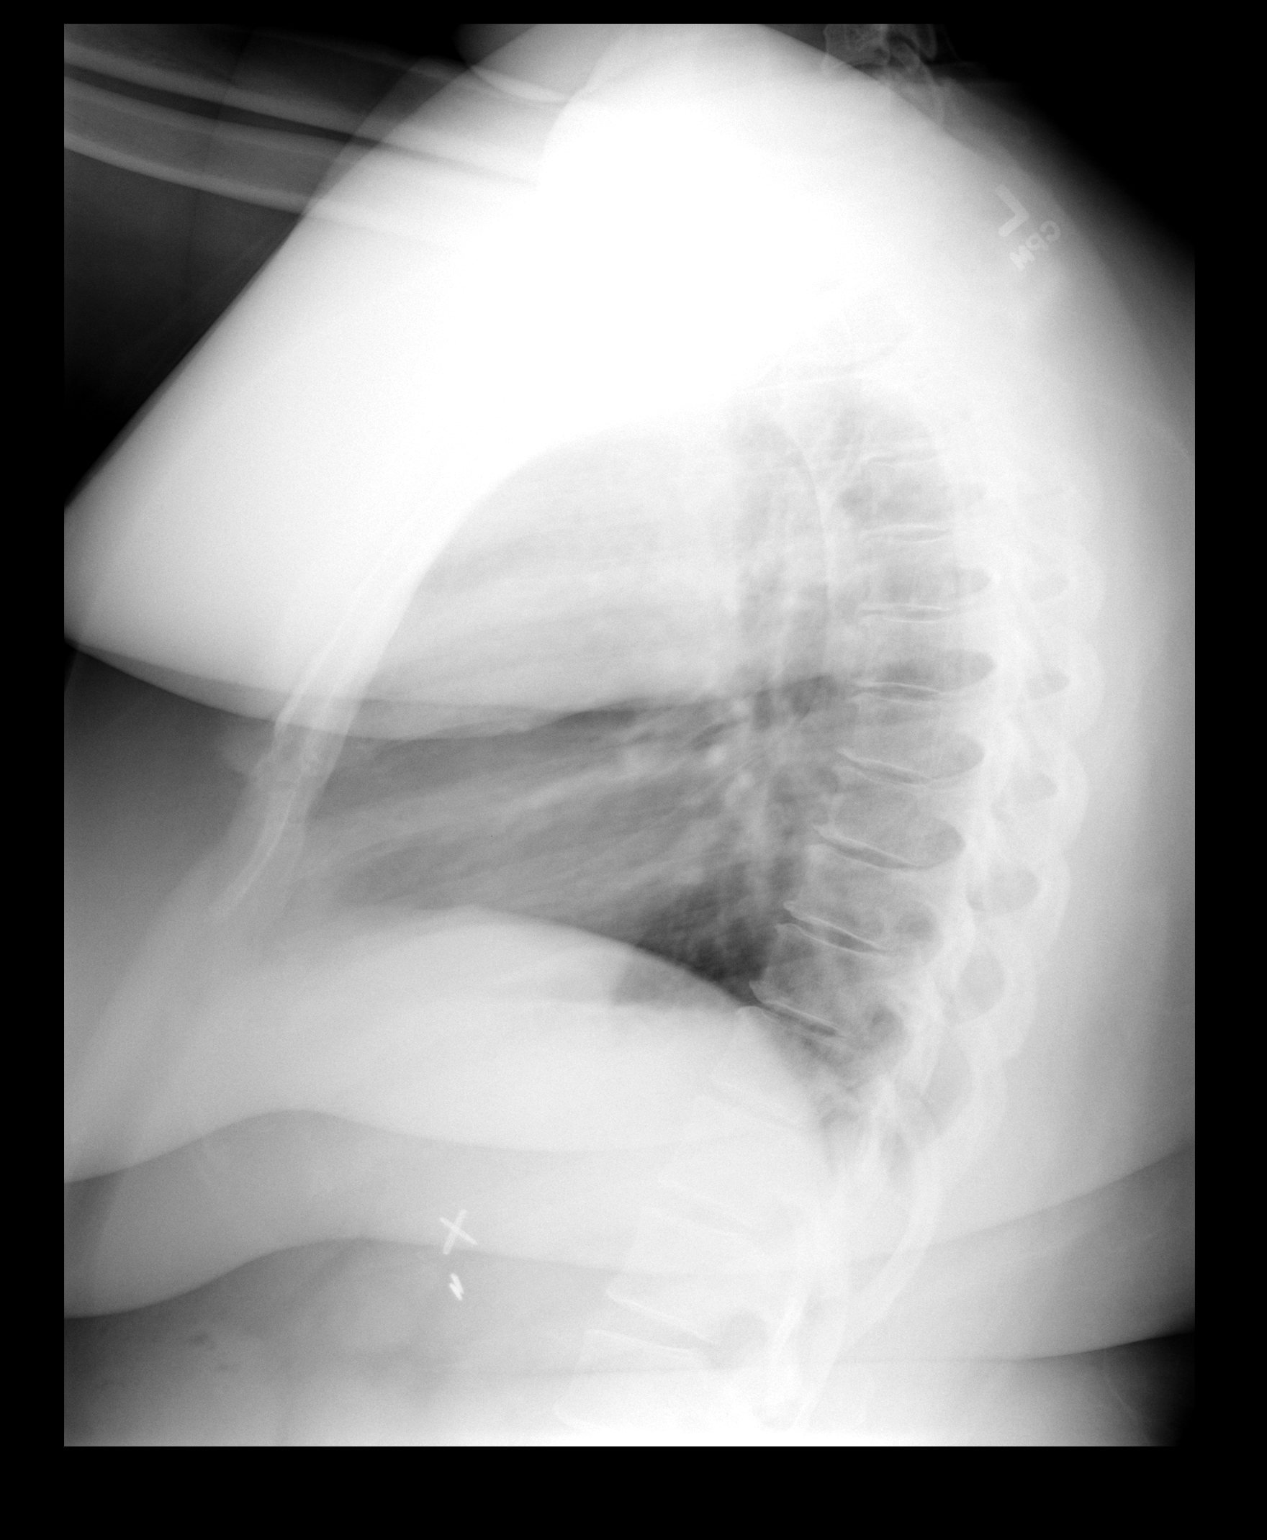

[2 of 2 positions shown; findings below may reference images not displayed]

Heart size is slightly increased since the prior study.  Pulmonary vascular congestion remains. However, there is no discrete edema.   The haziness over the lower lung zones is felt to represent overlying soft tissues.
IMPRESSION: Mild cardiomegaly slightly increased.  Pulmonary vascular congestion persists.

## 2006-08-08 IMAGING — CR DG NECK SOFT TISSUE
2 series · 2 of 2 positions shown · non-contrast
Comparison: none

CLINICAL DATA: Posterior neck pain. 
 SOFT TISSUE NECK:
 AP and lateral views of the soft tissues of the neck demonstrate obesity.   Normal airway and epiglottis.  Unremarkable bones.

[view not recorded (1 of 2)]
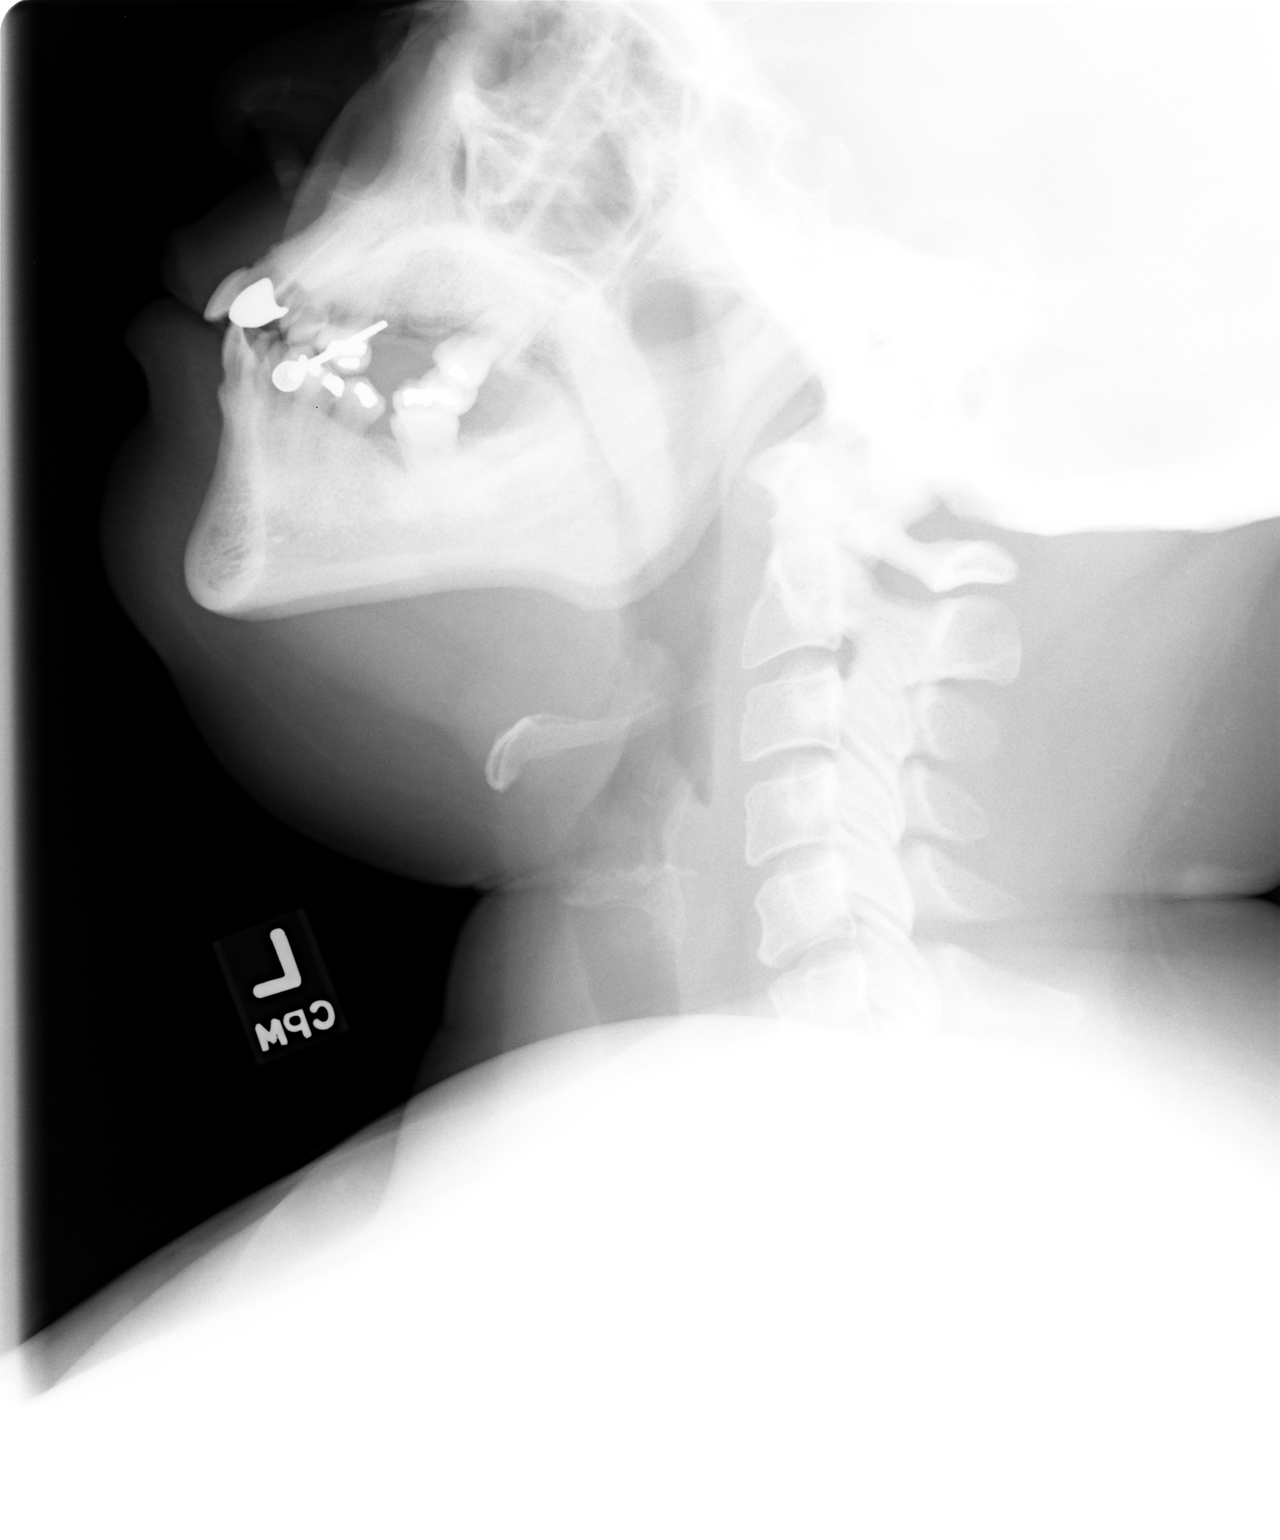

[view not recorded (2 of 2)]
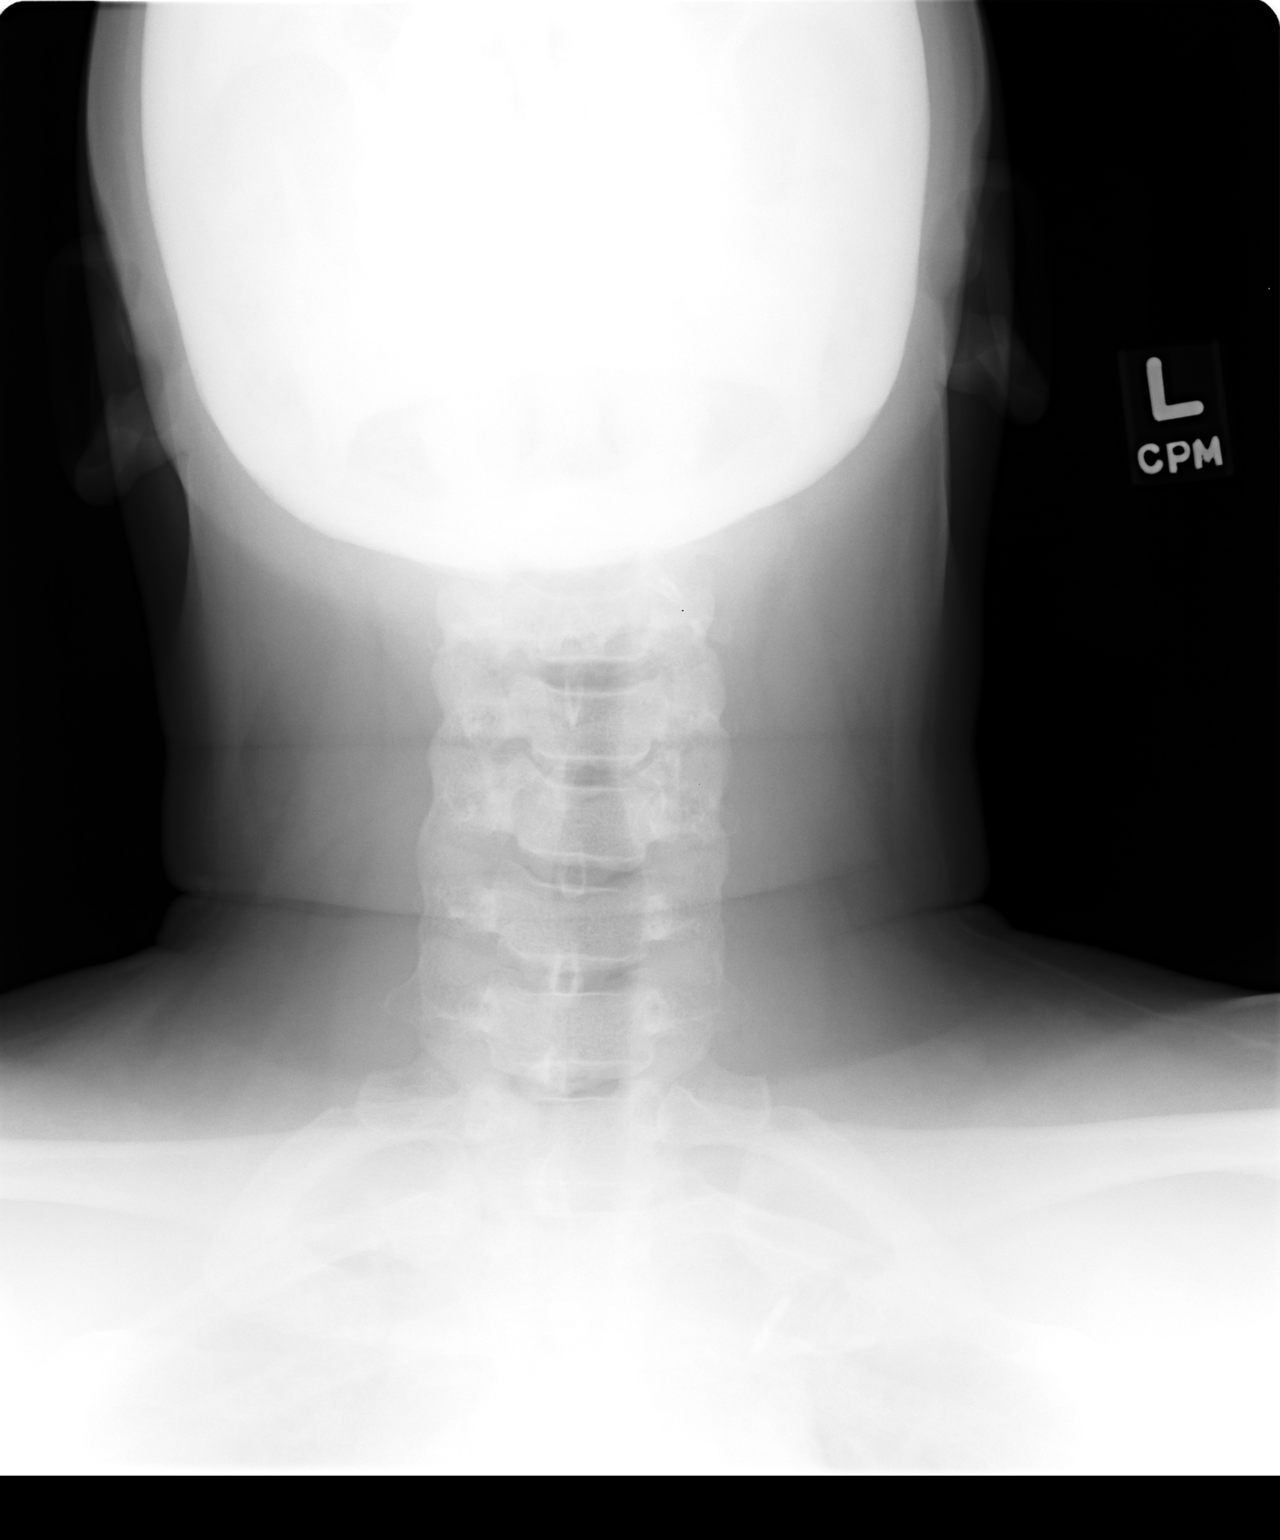

[2 of 2 positions shown; findings below may reference images not displayed]

IMPRESSION: Obesity.  No acute abnormality.

## 2006-09-05 IMAGING — MR MR CERVICAL SPINE W/O CM
5 of 8 series · 29 of 48 positions shown · IV contrast (agent unspecified)
Comparison: None.

CLINICAL DATA: Neck pain.  
 MRI CERVICAL SPINE WITHOUT CONTRAST:
TECHNIQUE: Multiplanar and multiecho pulse sequences of the cervical spine, to include the base of the skull and upper thoracic region, were obtained according to standard protocol without IV contrast.

[Series 1: sag loc · axial · 7.0mm · 1.02mm/px · z∈[-18,+129]mm · 3 of 8 slices shown (1 of 3)]
[im 1/8]
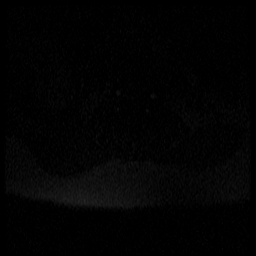
[im 4/8]
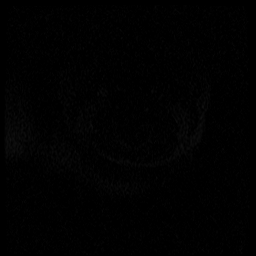
[im 8/8]
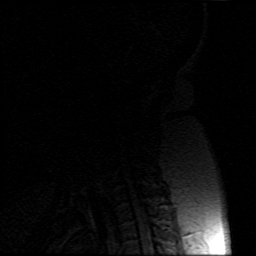

[Series 4: sag loc · axial · 7.0mm · 1.02mm/px · z∈[-18,+129]mm · 6 of 15 slices shown (2 of 3)]
[im 1/15]
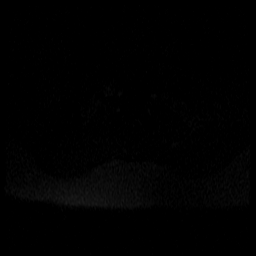
[im 3/15]
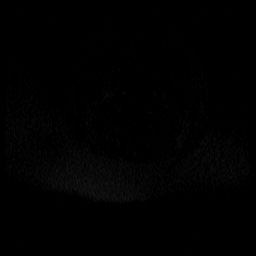
[im 6/15]
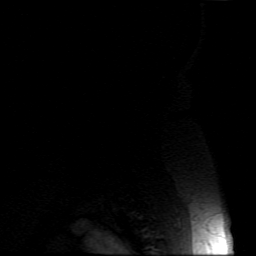
[im 9/15]
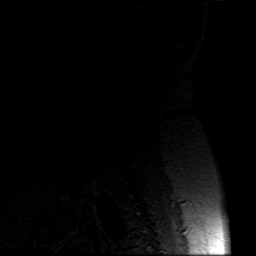
[im 12/15]
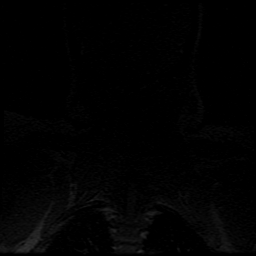
[im 15/15]
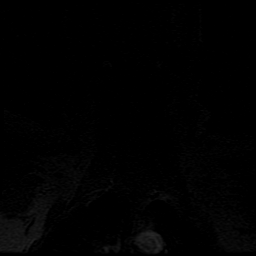

[Series 5: sag loc · axial · 7.0mm · 1.02mm/px · z∈[-18,+129]mm · 6 of 15 slices shown (3 of 3)]
[im 1/15]
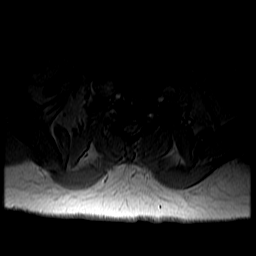
[im 3/15]
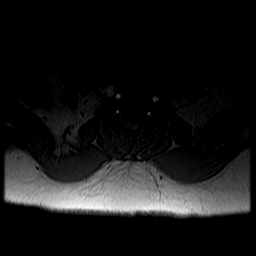
[im 6/15]
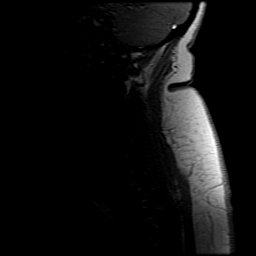
[im 9/15]
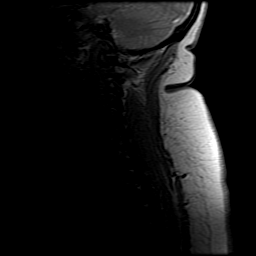
[im 12/15]
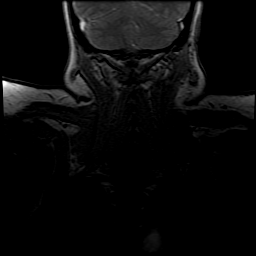
[im 15/15]
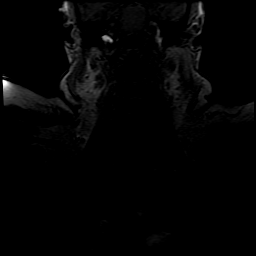

[Series 6: T2 · sagittal · 3.0mm · 0.43mm/px · 5 of 12 slices shown (1 of 2)]
[im 1/12]
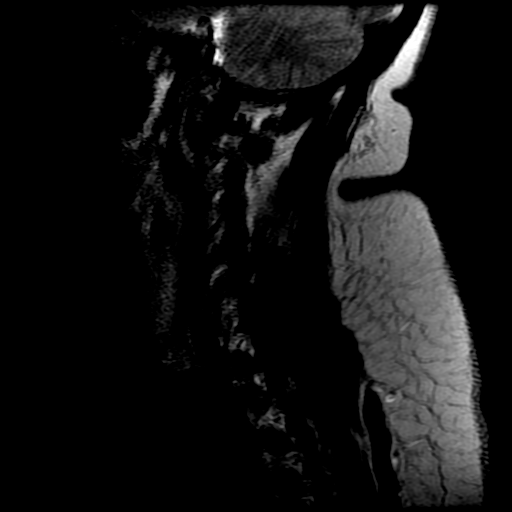
[im 3/12]
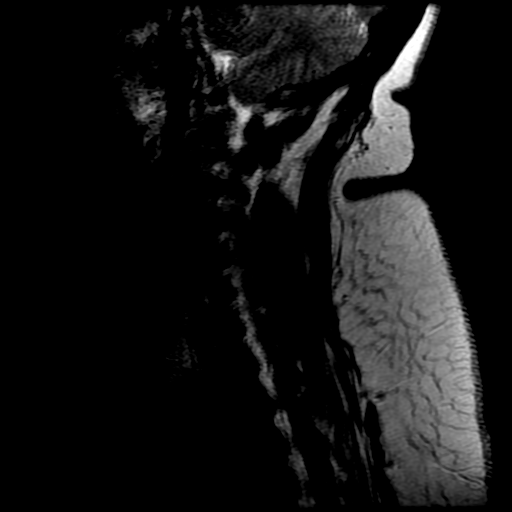
[im 6/12]
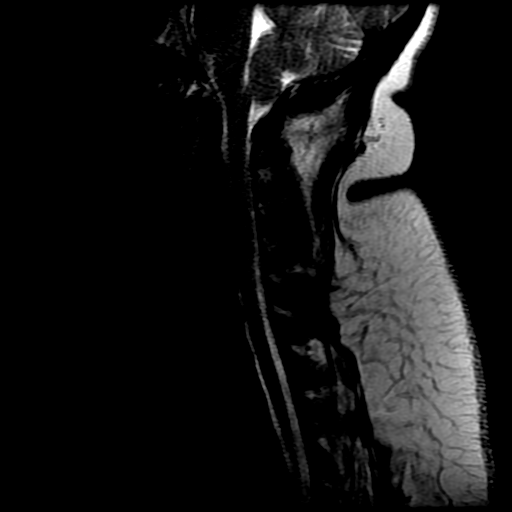
[im 9/12]
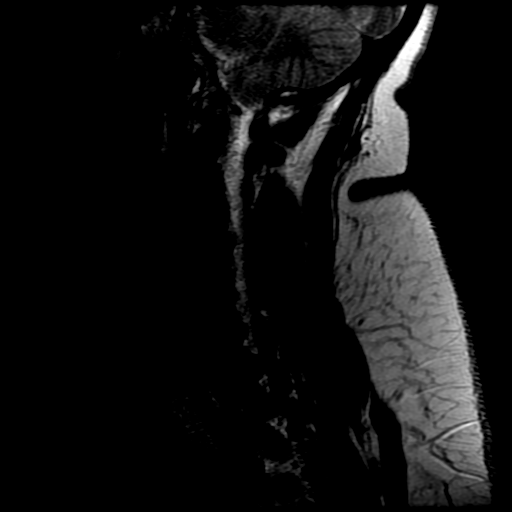
[im 12/12]
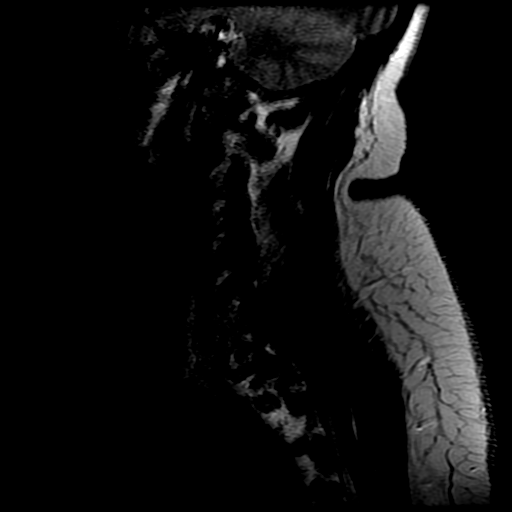

[Series 10: T2 · axial · 3.0mm · 0.39mm/px · z∈[-73,+107]mm · 9 of 23 slices shown (2 of 2)]
[im 1/23]
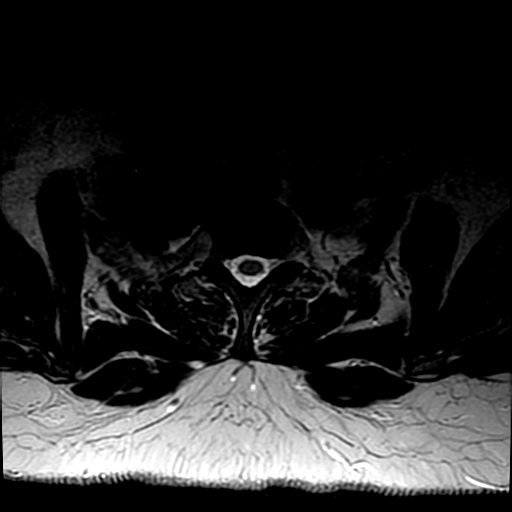
[im 3/23]
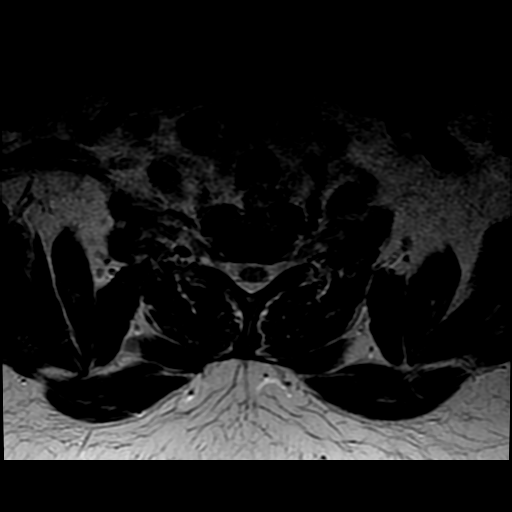
[im 6/23]
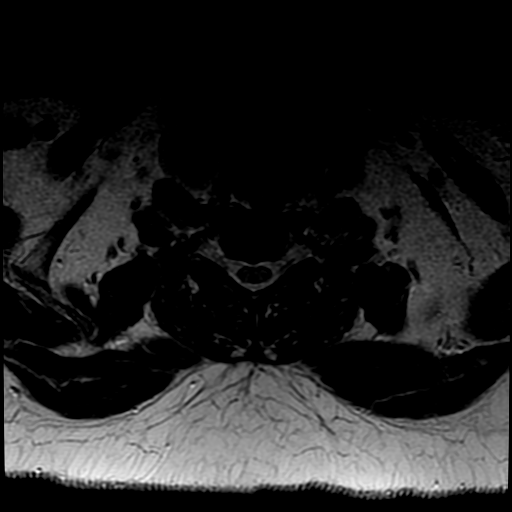
[im 9/23]
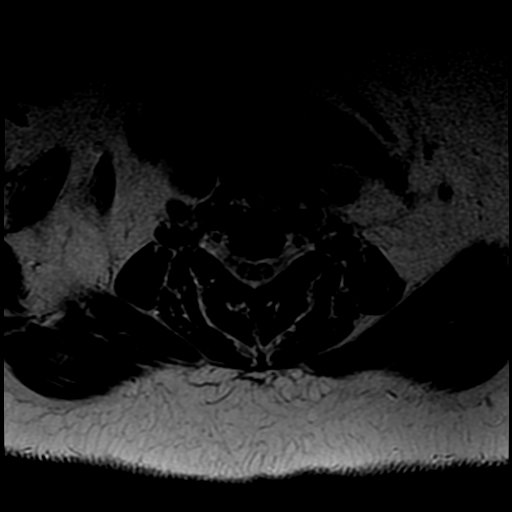
[im 12/23]
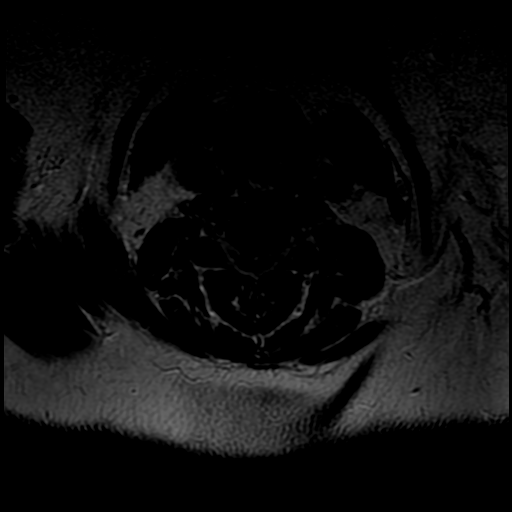
[im 14/23]
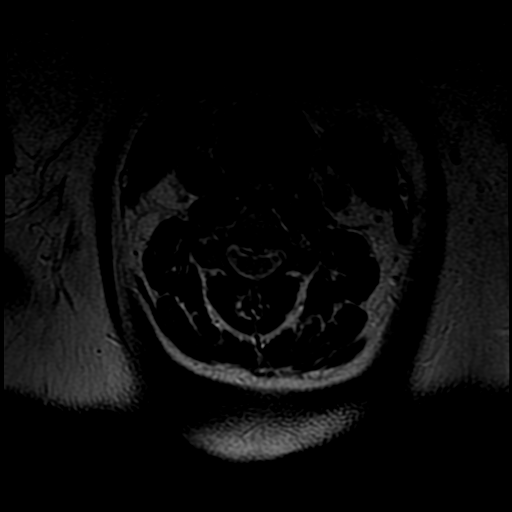
[im 17/23]
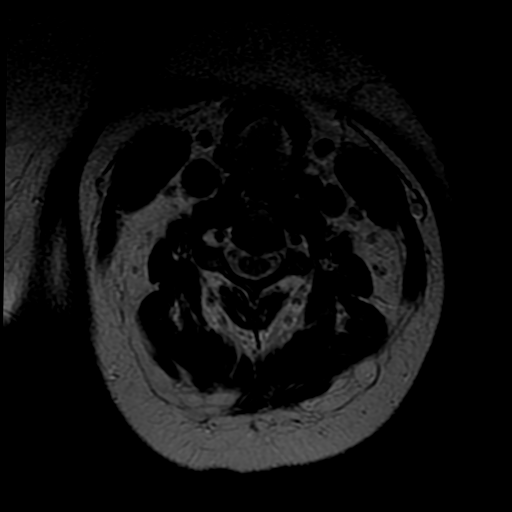
[im 20/23]
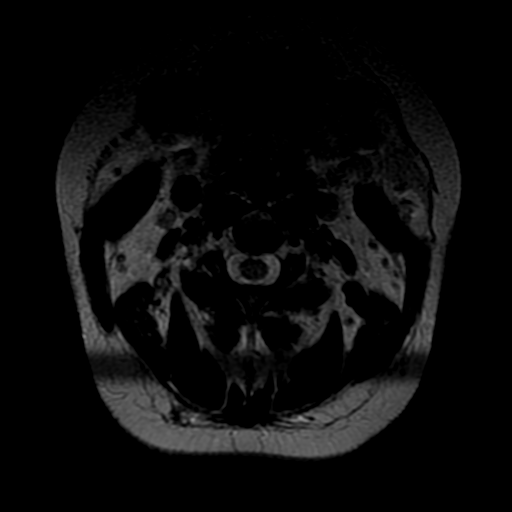
[im 23/23]
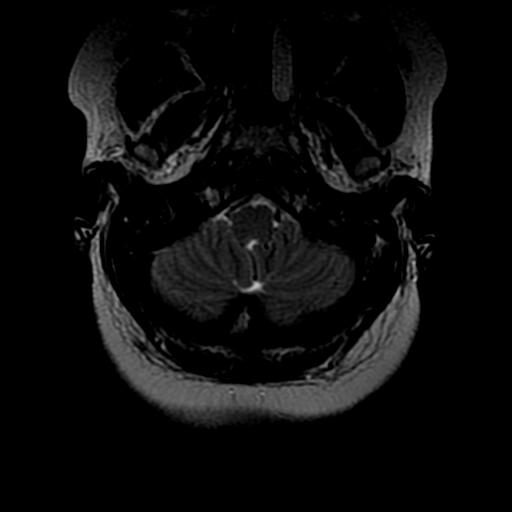

[29 of 48 positions shown; findings below may reference images not displayed]

FINDINGS: There is no abnormality at the foramen magnum of C1-2. The foramen magnum of C1-2 are normal.  
 At C2-3, there is a disc bulge but no herniation or stenosis. 
 At C3-4, shallow disc protrusion eccentrically more prominent towards the left.  Narrowing of the ventral subarachnoid space but no cord compression.   The foramina appears sufficiently patent, there being only mild narrowing on the left. 
 At C4-5, broad-based disc herniation eccentrically more prominent towards the right. The ventral subarachnoid space is effaced and the cord is indented. There is neural foraminal encroachment of a mild degree on the left.  
 At C5-6, mild spondylosis and disc bulge but no significant canal or foraminal narrowing. 
 At C6-7, small central disc protrusion but no significant canal or foraminal narrowing.
 At C7-T1, no abnormality. 
 Upper thoracic spine:  normal.
IMPRESSION: 1.  C2-3, disc bulge but no herniation or stenosis. 
 2.  C3-4, shallow disc protrusion centrally and towards the left.  No definite neural compression.
 3.  C4-5, broad-based disc herniation eccentrically more prominent towards the right.  Narrowing of the canal with indentation upon the cord.  Neural foraminal encroachment on the left. 
 4.  C5-6, small central disc protrusion without stenosis. 
 5.  C6-7, small central disc protrusion without stenosis.

## 2006-09-05 IMAGING — CT CT ANGIO CHEST
1 of 6 series · 13 of 30 positions shown · IV contrast (120 ML OMNI 300)
Comparison: Chest x-ray performed on 11/27/04.

CLINICAL DATA: Dyspnea.  History of colon cancer. 
 CT ANGIOGRAPHY OF CHEST:
TECHNIQUE: Multidetector CT imaging of the chest was performed during bolus injection of intravenous contrast.  Multiplanar CT angiographic image reconstructions were generated to evaluate the vascular anatomy.
 Contrast:  120 cc Omnipaque 300

[Series 3: pe · axial · 0.70mm/px · z∈[-344,-123]mm · 13 of 419 slices shown]
[im 33/419  lung]
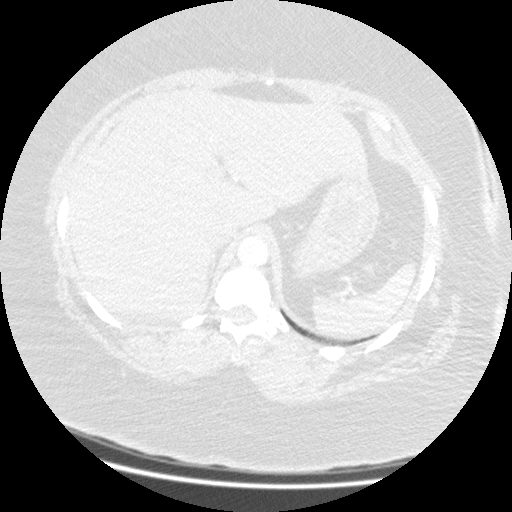
[im 65/419  mediastinal]
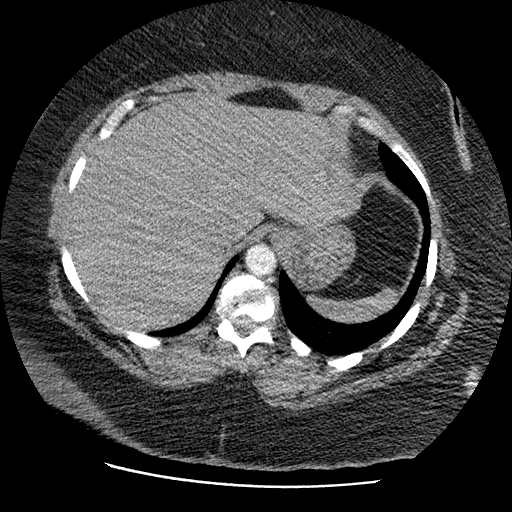
[im 97/419  lung]
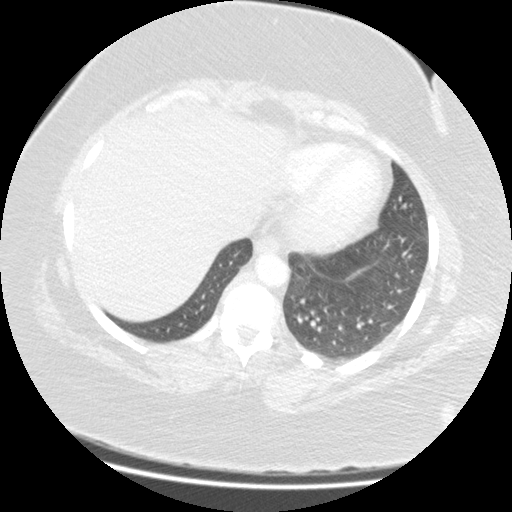
[im 129/419  mediastinal]
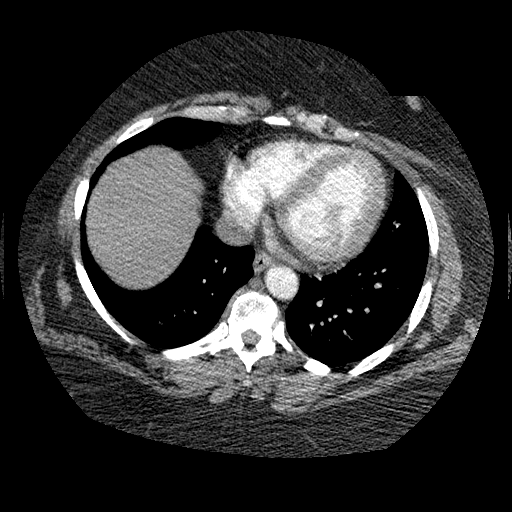
[im 161/419  lung]
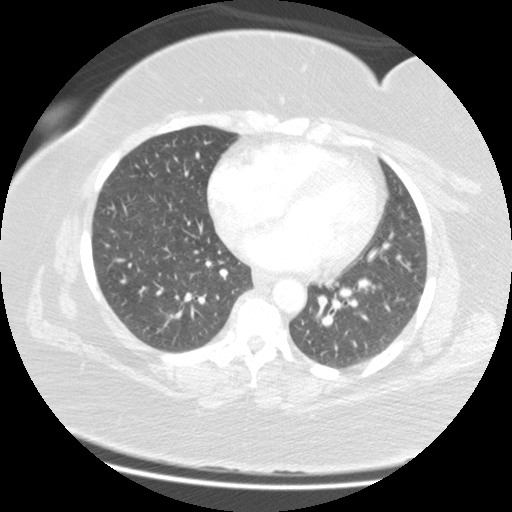
[im 193/419  mediastinal]
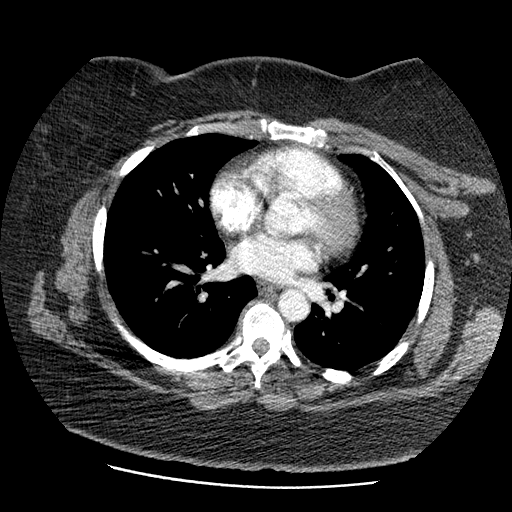
[im 194/419  lung]
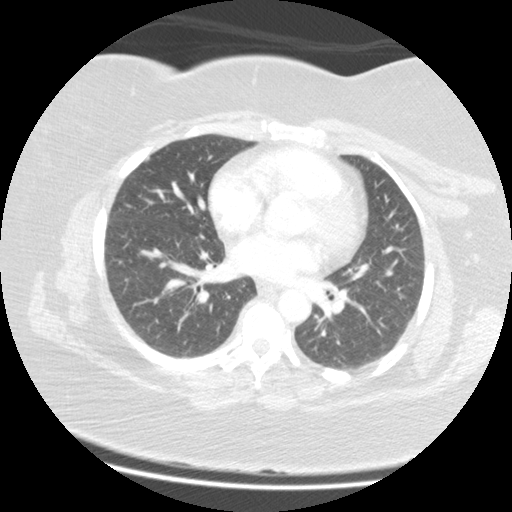
[im 226/419  mediastinal]
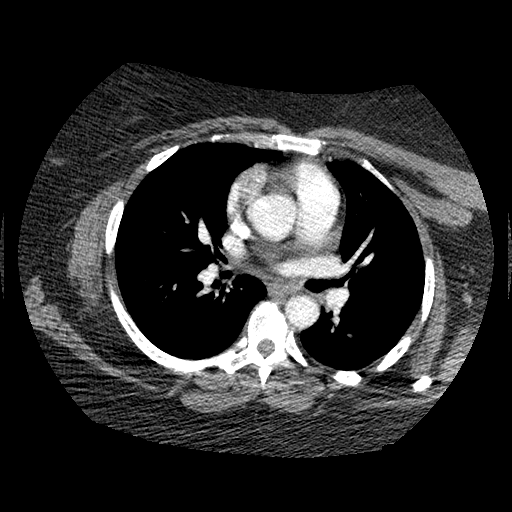
[im 258/419  lung]
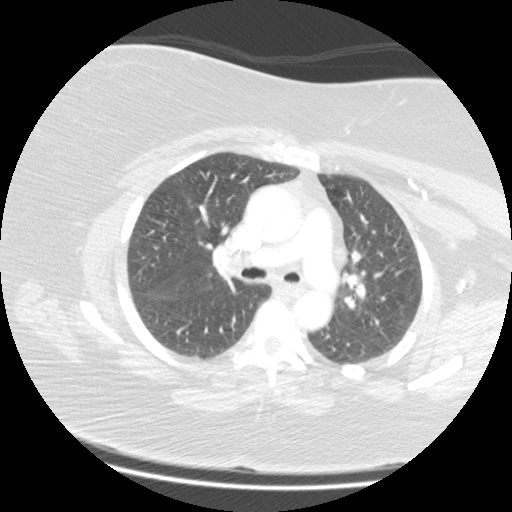
[im 290/419  mediastinal]
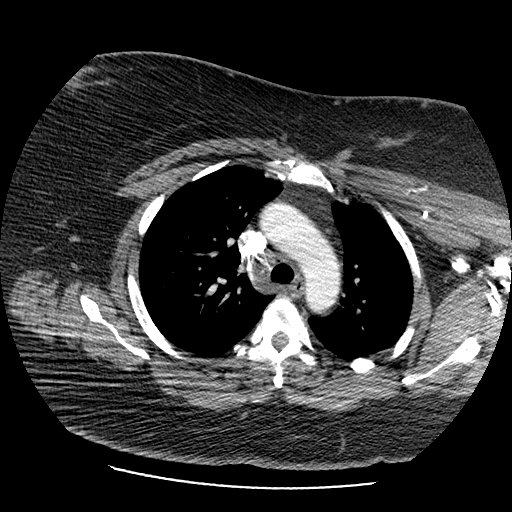
[im 322/419  lung]
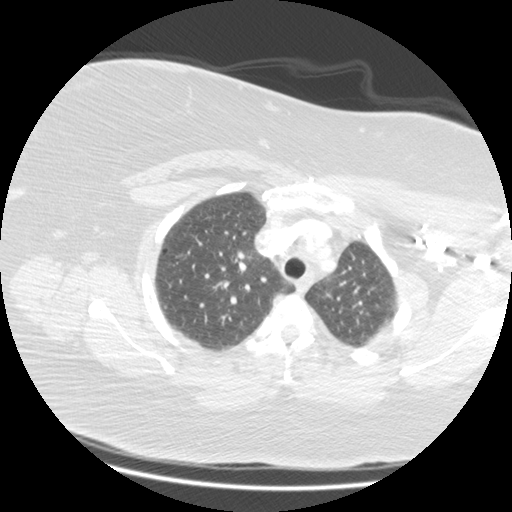
[im 354/419  mediastinal]
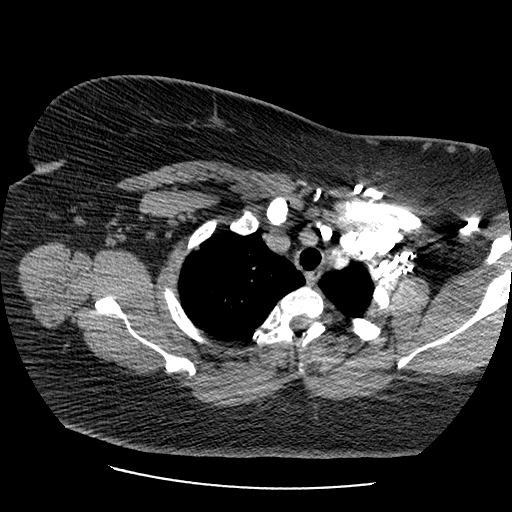
[im 386/419  lung]
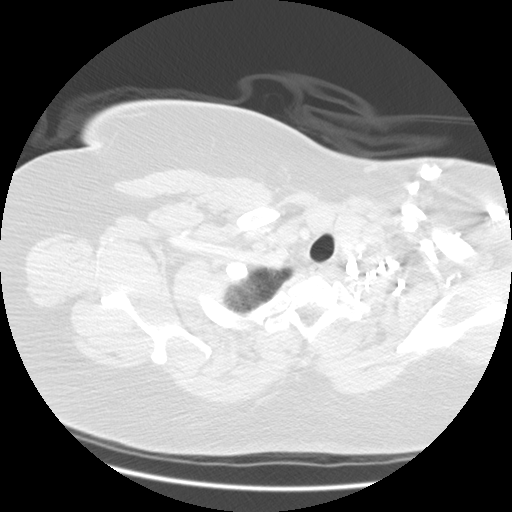

[13 of 30 positions shown; findings below may reference images not displayed]

There are no filling defects of the pulmonary arterial system to suggest pulmonary embolus.  There is no evidence of aortic dissection or aneurysm.  Lungs are clear.  There are shotty mediastinal lymph nodes which are likely reactive.  No enlarged lymph nodes are identified in the axillary, mediastinal, or hilar regions.  Lungs are clear.  Visualized portions of the upper abdomen are unremarkable.  Mild cardiomegaly is identified.
IMPRESSION: 1.  No acute abnormality, specifically, no evidence of pulmonary emboli. 
 2.  Mild cardiomegaly.

## 2006-09-29 ENCOUNTER — Emergency Department (HOSPITAL_COMMUNITY): Admission: EM | Admit: 2006-09-29 | Discharge: 2006-09-30 | Payer: Self-pay | Admitting: Emergency Medicine

## 2006-10-30 IMAGING — CT CT CHEST W/ CM
1 of 2 series · 14 of 30 positions shown, 18 images · IV contrast (omnipaque)
Comparison: none

CLINICAL DATA: History of colon cancer.  Cough, shortness of breath, and wheezing.
CHEST CT WITH CONTRAST:
TECHNIQUE: Multidetector CT imaging of the chest was performed following the standard protocol during bolus administration of intravenous contrast.   
Contrast:  125 cc Omnipaque 300 IV. 
Comparison examination is from 12/25/04.

[Series 2: chest routine 5.0 b10f · axial · 0.66mm/px · z∈[-357,-87]mm · 14 of 64 slices shown, 18 images]
[im 5/64  mediastinal]
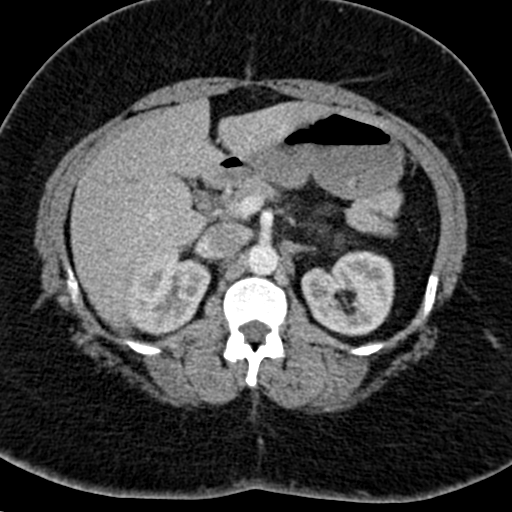
[im 5/64  lung]
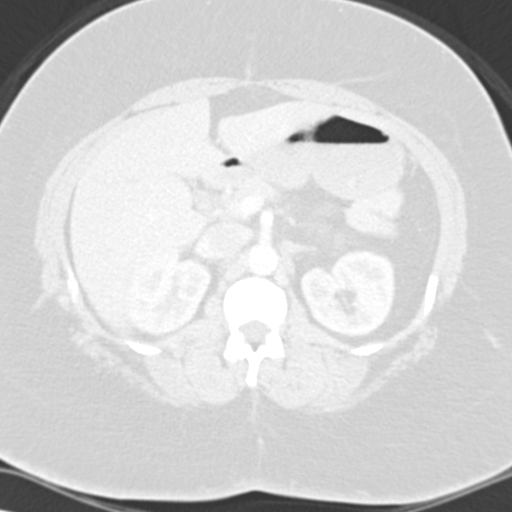
[im 10/64  lung]
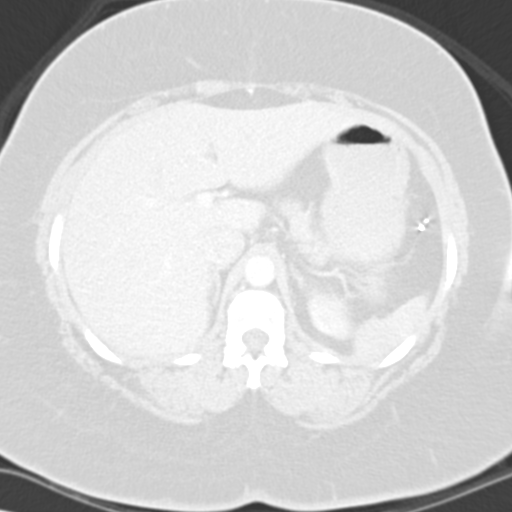
[im 14/64  lung]
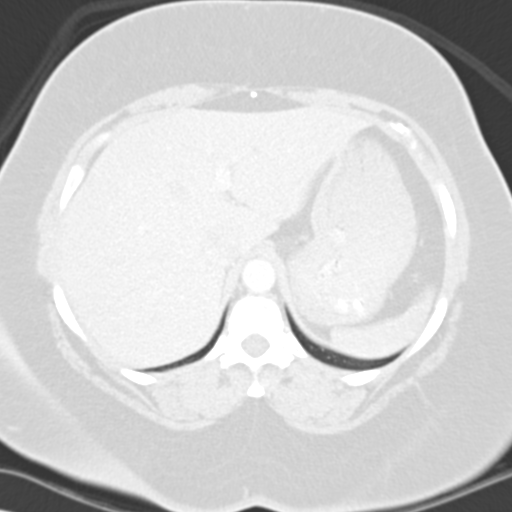
[im 19/64  lung]
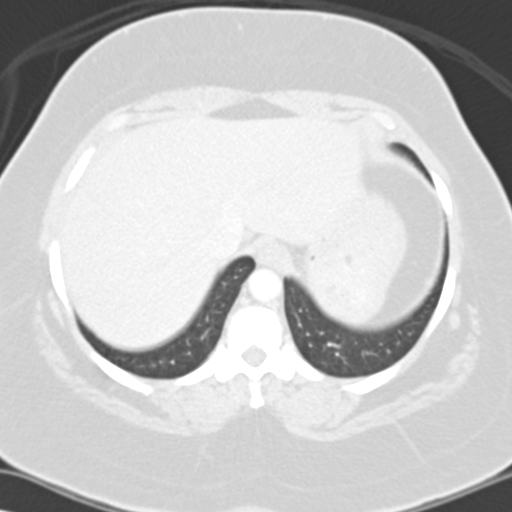
[im 23/64  mediastinal]
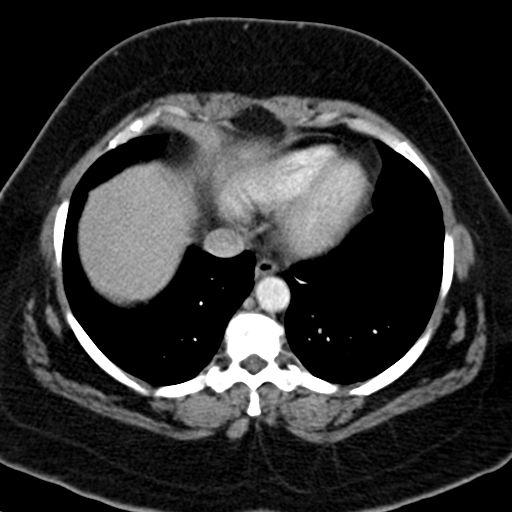
[im 23/64  lung]
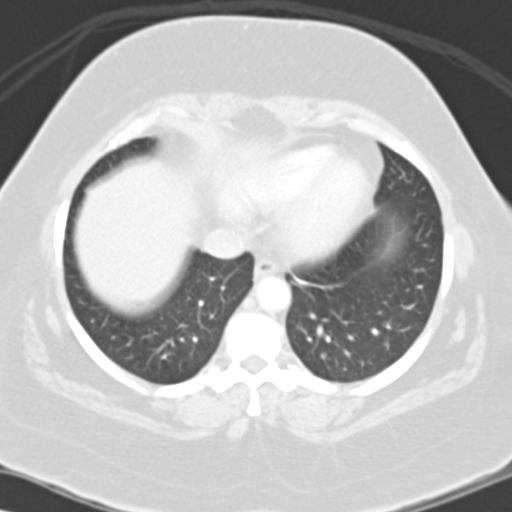
[im 28/64  lung]
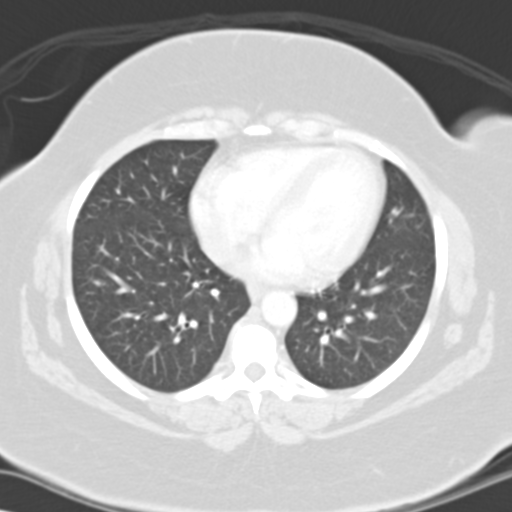
[im 31/64  lung]
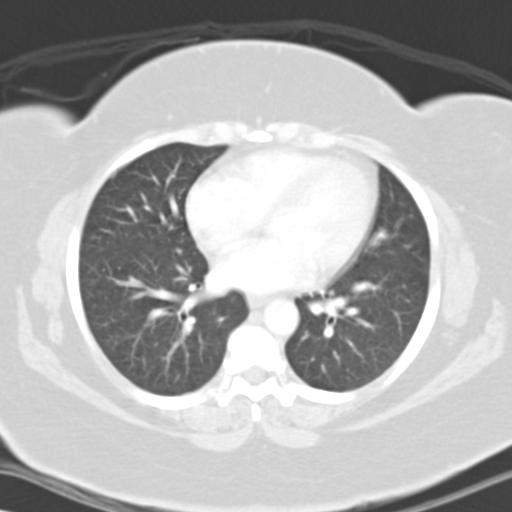
[im 32/64  lung]
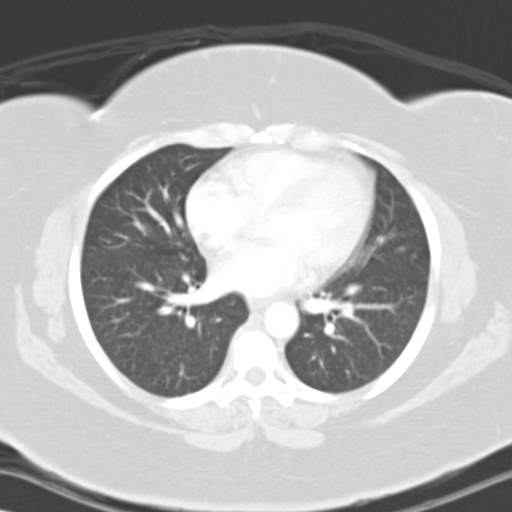
[im 37/64  mediastinal]
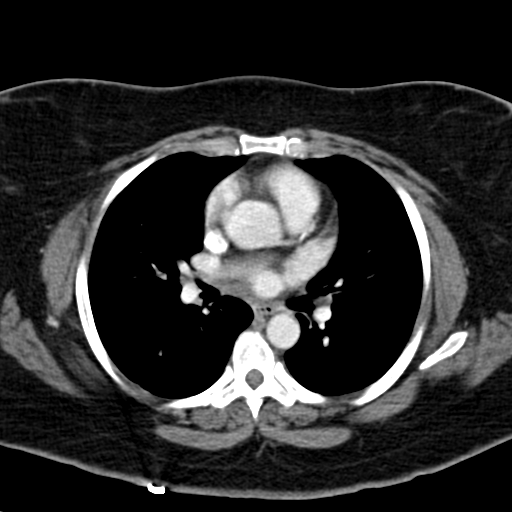
[im 37/64  lung]
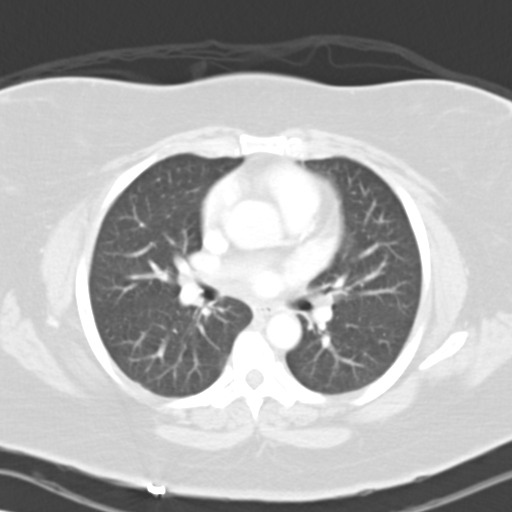
[im 41/64  lung]
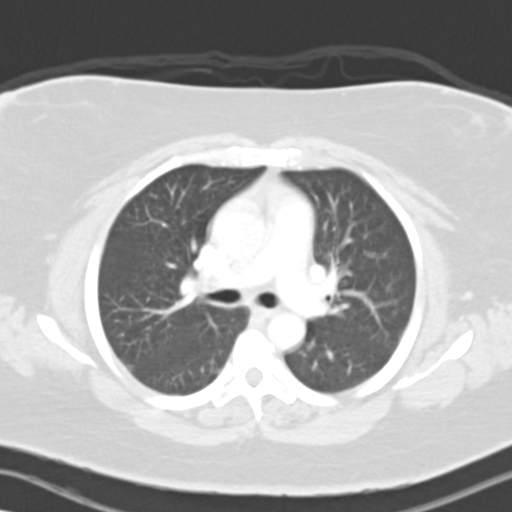
[im 46/64  lung]
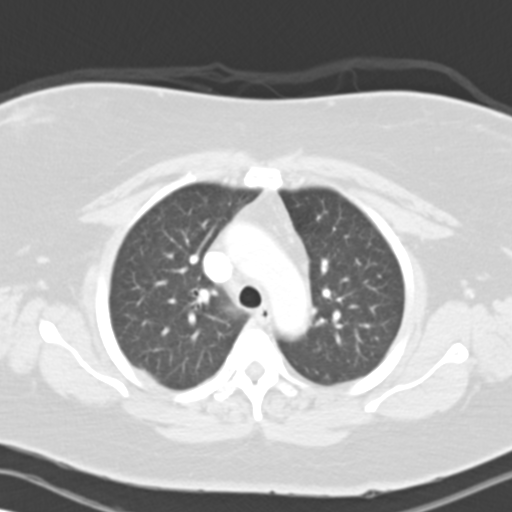
[im 50/64  lung]
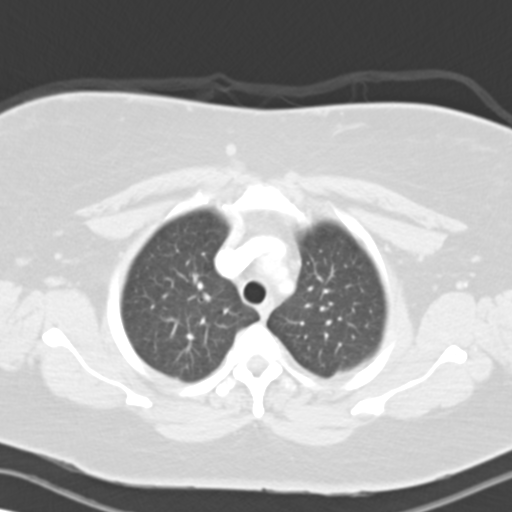
[im 55/64  mediastinal]
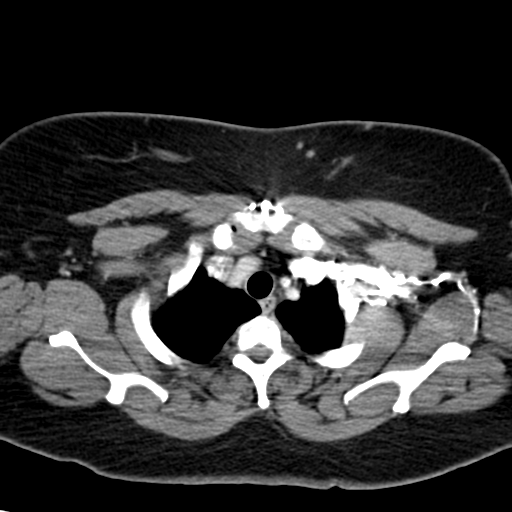
[im 55/64  lung]
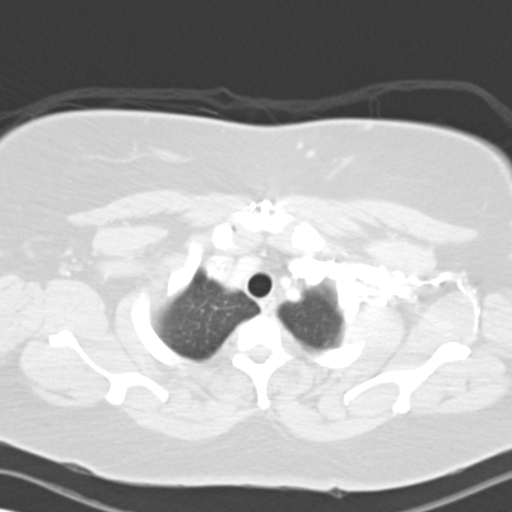
[im 59/64  lung]
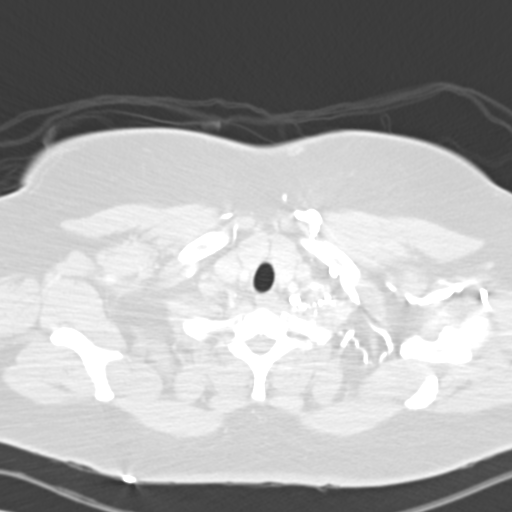

[14 of 30 positions shown; findings below may reference images not displayed]

FINDINGS: The trachea and mainstem bronchi are patent.  Interval development of a small triangular wedge-shaped peripherally located opacity along the superior segment of the right lower lobe (image #26).  This may represent a small area of pneumonia/atelectasis and can be further evaluated on follow-up imaging if there are progressive symptoms.  No nodular pulmonary lesions to suggest the presence of pulmonary metastatic disease.  Once again noted are small, normal size, lymph nodes, the largest located in the preaortic region and measuring less than 1 cm in short axis.  Minimal calcifications suspected along the coronary arteries.  Mild thoracic aortic calcifications consistent with atherosclerotic type changes.  No pericardial effusion.  No obvious pulmonary embolus.  Pulmonary embolus protocol was not utilized.  No bony destructive lesion.  Surgical clip in the left upper quadrant of the abdomen.  Limited views of the liver without focal lesion.  No adrenal nodule.
IMPRESSION: 1.  Minimal wedge-shaped opacity along the peripheral aspect of the superior segment of the right lower lobe may represent atelectasis/small area of pneumonitis.  If there are progressive symptoms this area can be further evaluated on follow-up imaging.  
2.  No pulmonary nodules detected to suggest the presence of pulmonary metastatic disease.
3.  Mild atherosclerotic type changes of coronary arteries and thoracic aorta.

## 2006-10-30 IMAGING — CT CT CHEST W/ CM
1 series · 15 of 28 positions shown, 19 images · IV contrast (omnipaque)
Comparison: none

CLINICAL DATA: History of colon cancer.  Cough, shortness of breath, and wheezing.
CHEST CT WITH CONTRAST:
TECHNIQUE: Multidetector CT imaging of the chest was performed following the standard protocol during bolus administration of intravenous contrast.   
Contrast:  125 cc Omnipaque 300 IV. 
Comparison examination is from 12/25/04.

[Series 3: orbi/facial 3.0 h40s · axial · 0.23mm/px · z∈[+1209,+1293]mm · 15 of 32 slices shown, 19 images]
[im 2/32  mediastinal]
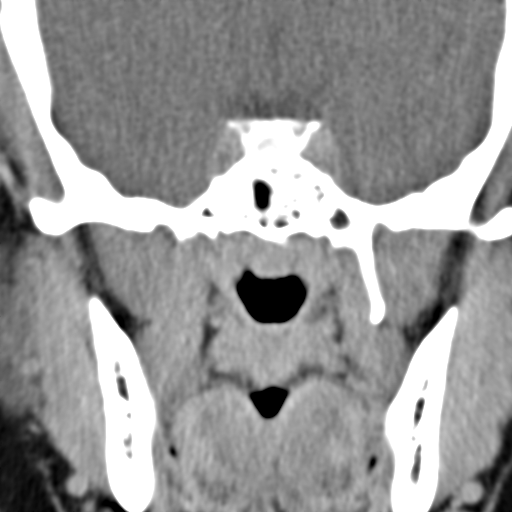
[im 2/32  lung]
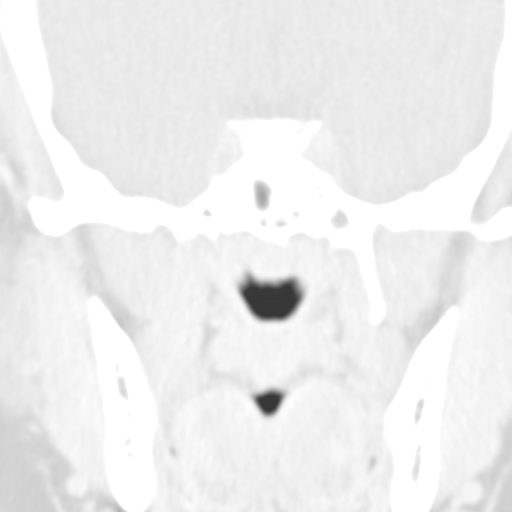
[im 4/32  lung]
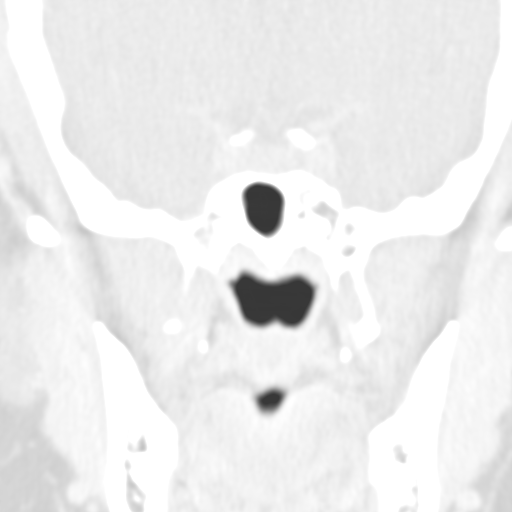
[im 6/32  lung]
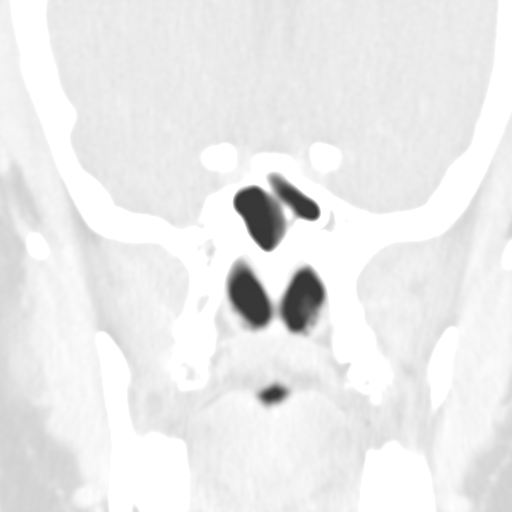
[im 9/32  lung]
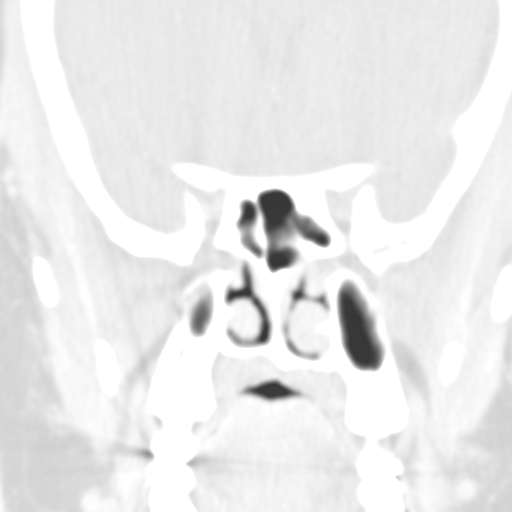
[im 10/32  mediastinal]
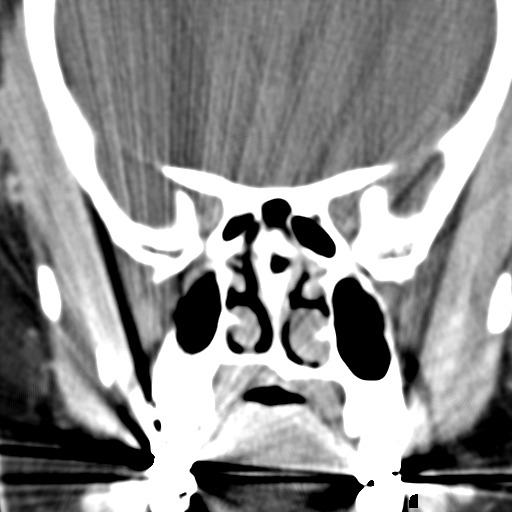
[im 10/32  lung]
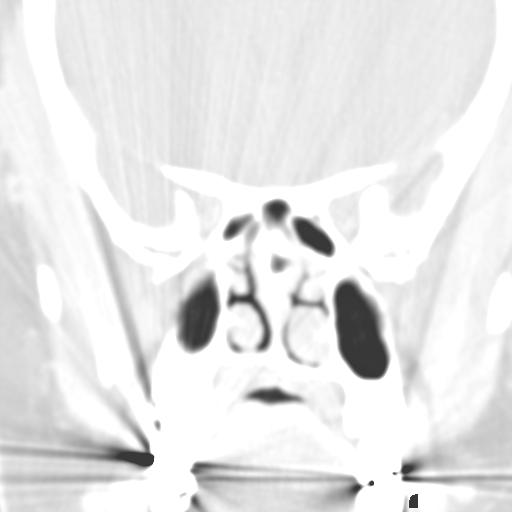
[im 12/32  lung]
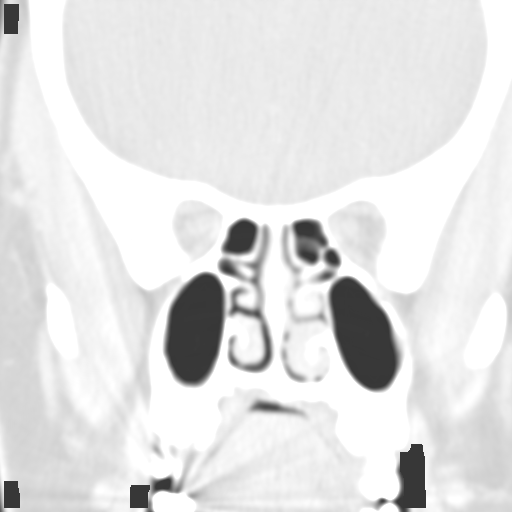
[im 14/32  lung]
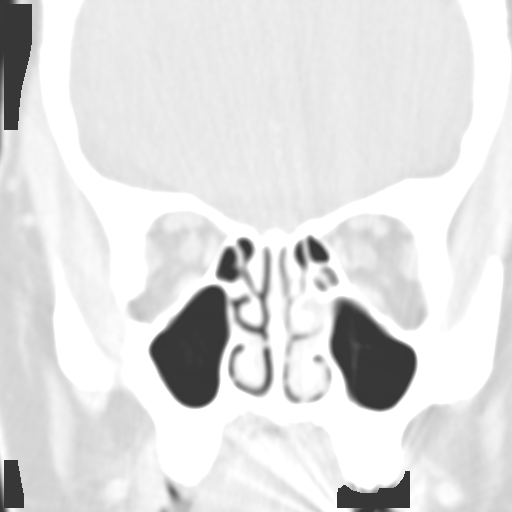
[im 17/32  lung]
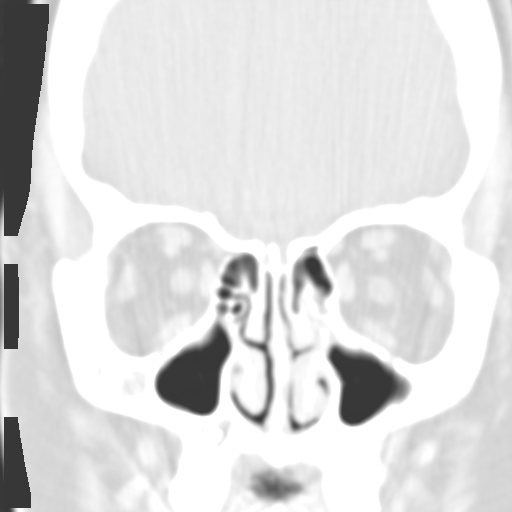
[im 18/32  mediastinal]
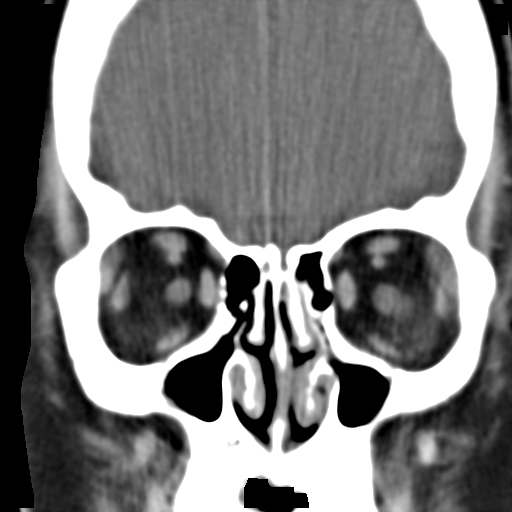
[im 18/32  lung]
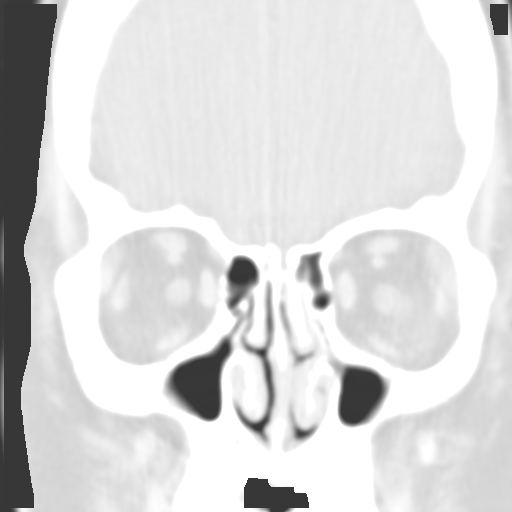
[im 20/32  lung]
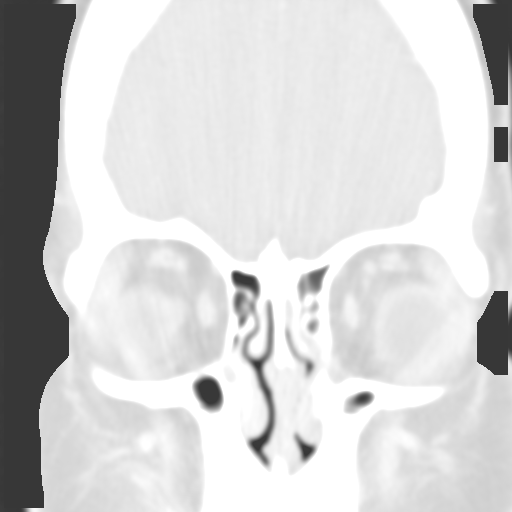
[im 22/32  lung]
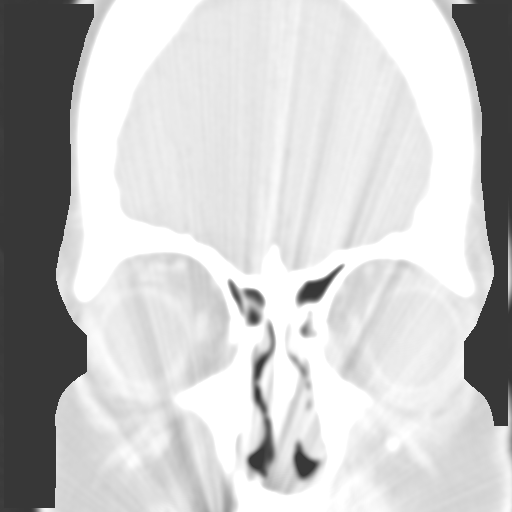
[im 23/32  lung]
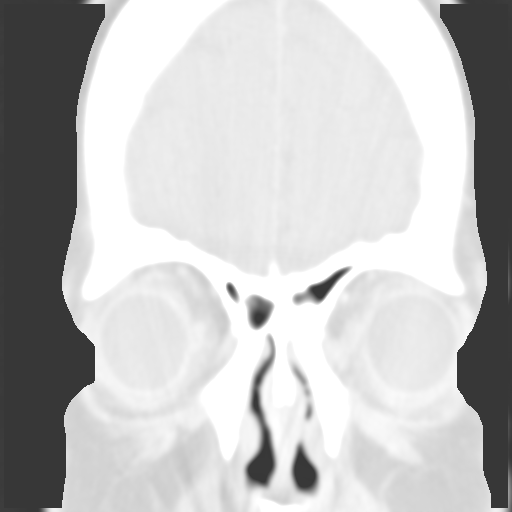
[im 26/32  mediastinal]
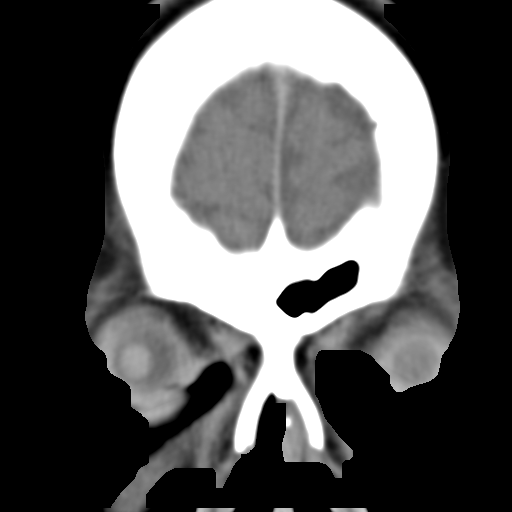
[im 26/32  lung]
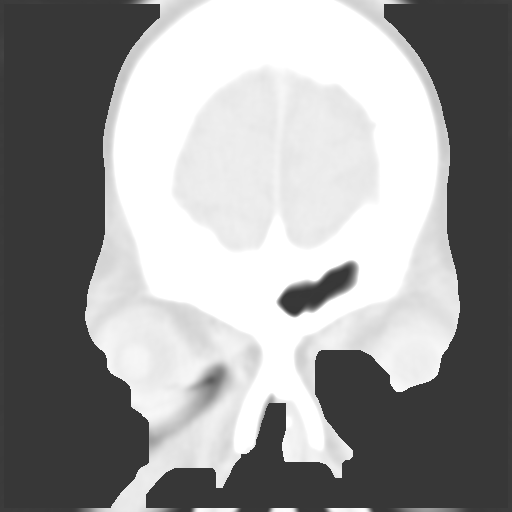
[im 28/32  lung]
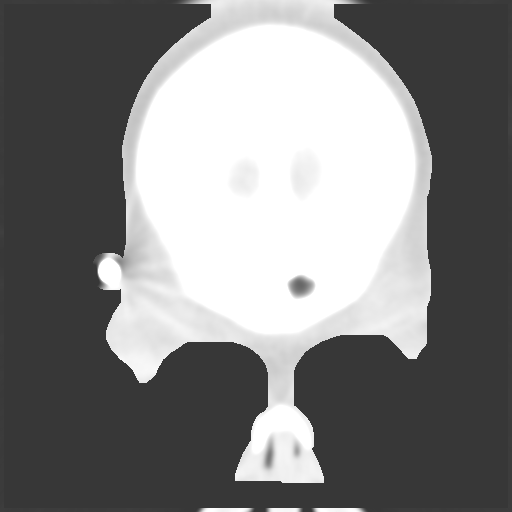
[im 30/32  lung]
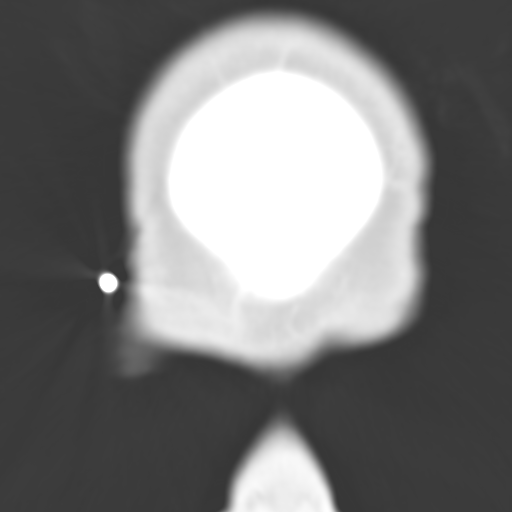

[15 of 28 positions shown; findings below may reference images not displayed]

FINDINGS: The trachea and mainstem bronchi are patent.  Interval development of a small triangular wedge-shaped peripherally located opacity along the superior segment of the right lower lobe (image #26).  This may represent a small area of pneumonia/atelectasis and can be further evaluated on follow-up imaging if there are progressive symptoms.  No nodular pulmonary lesions to suggest the presence of pulmonary metastatic disease.  Once again noted are small, normal size, lymph nodes, the largest located in the preaortic region and measuring less than 1 cm in short axis.  Minimal calcifications suspected along the coronary arteries.  Mild thoracic aortic calcifications consistent with atherosclerotic type changes.  No pericardial effusion.  No obvious pulmonary embolus.  Pulmonary embolus protocol was not utilized.  No bony destructive lesion.  Surgical clip in the left upper quadrant of the abdomen.  Limited views of the liver without focal lesion.  No adrenal nodule.
IMPRESSION: 1.  Minimal wedge-shaped opacity along the peripheral aspect of the superior segment of the right lower lobe may represent atelectasis/small area of pneumonitis.  If there are progressive symptoms this area can be further evaluated on follow-up imaging.  
2.  No pulmonary nodules detected to suggest the presence of pulmonary metastatic disease.
3.  Mild atherosclerotic type changes of coronary arteries and thoracic aorta.

## 2007-01-25 ENCOUNTER — Ambulatory Visit (HOSPITAL_BASED_OUTPATIENT_CLINIC_OR_DEPARTMENT_OTHER): Admission: RE | Admit: 2007-01-25 | Discharge: 2007-01-25 | Payer: Self-pay | Admitting: Internal Medicine

## 2007-02-02 IMAGING — CR DG CHEST 2V
2 series · 2 of 2 positions shown · non-contrast
Comparison: Chest radiograph 02/16/05.

CLINICAL DATA: Chest pain.  Shortness of breath.  Fever.  
 PA AND LATERAL CHEST - 2 VIEWS:

[w chest pa]
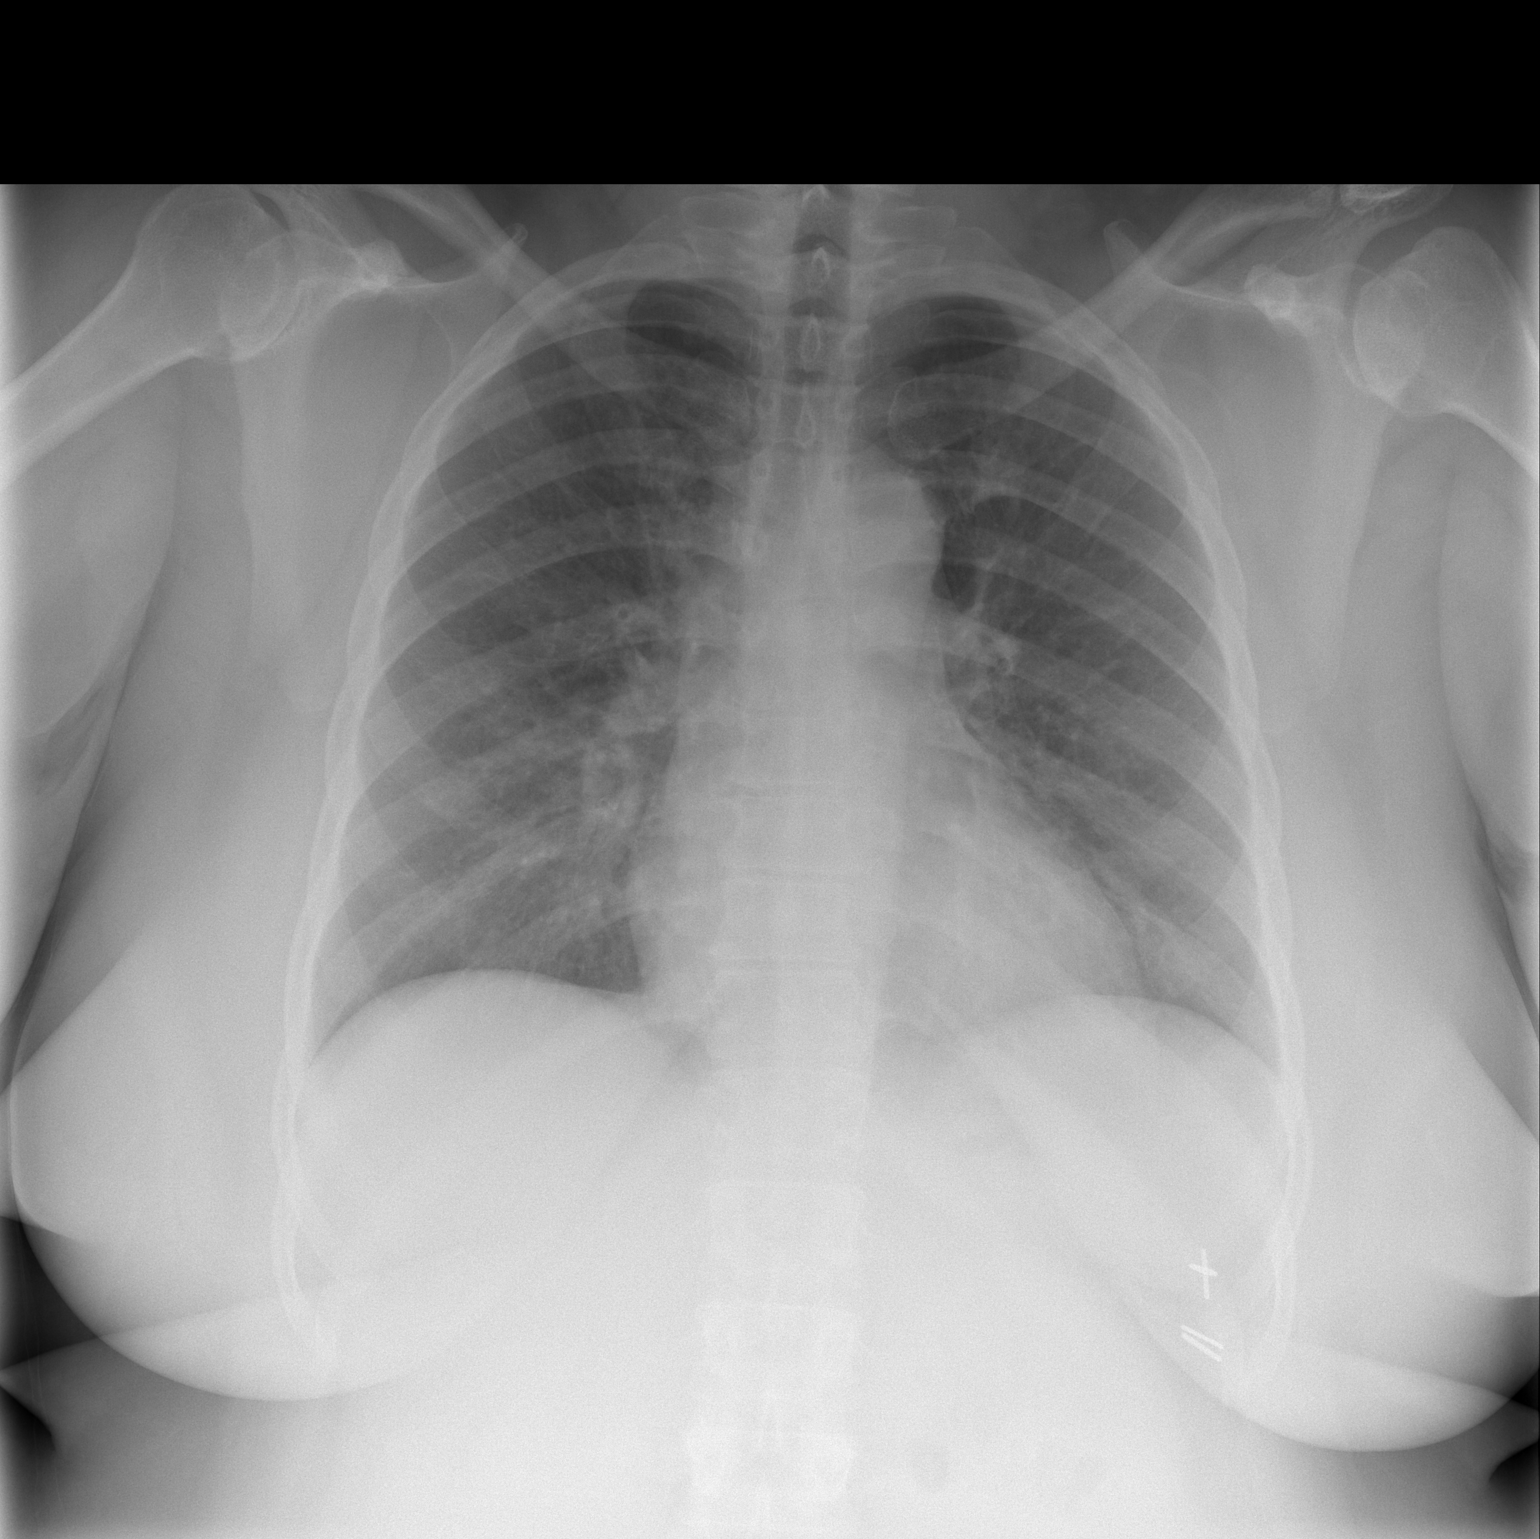

[w chest lat *]
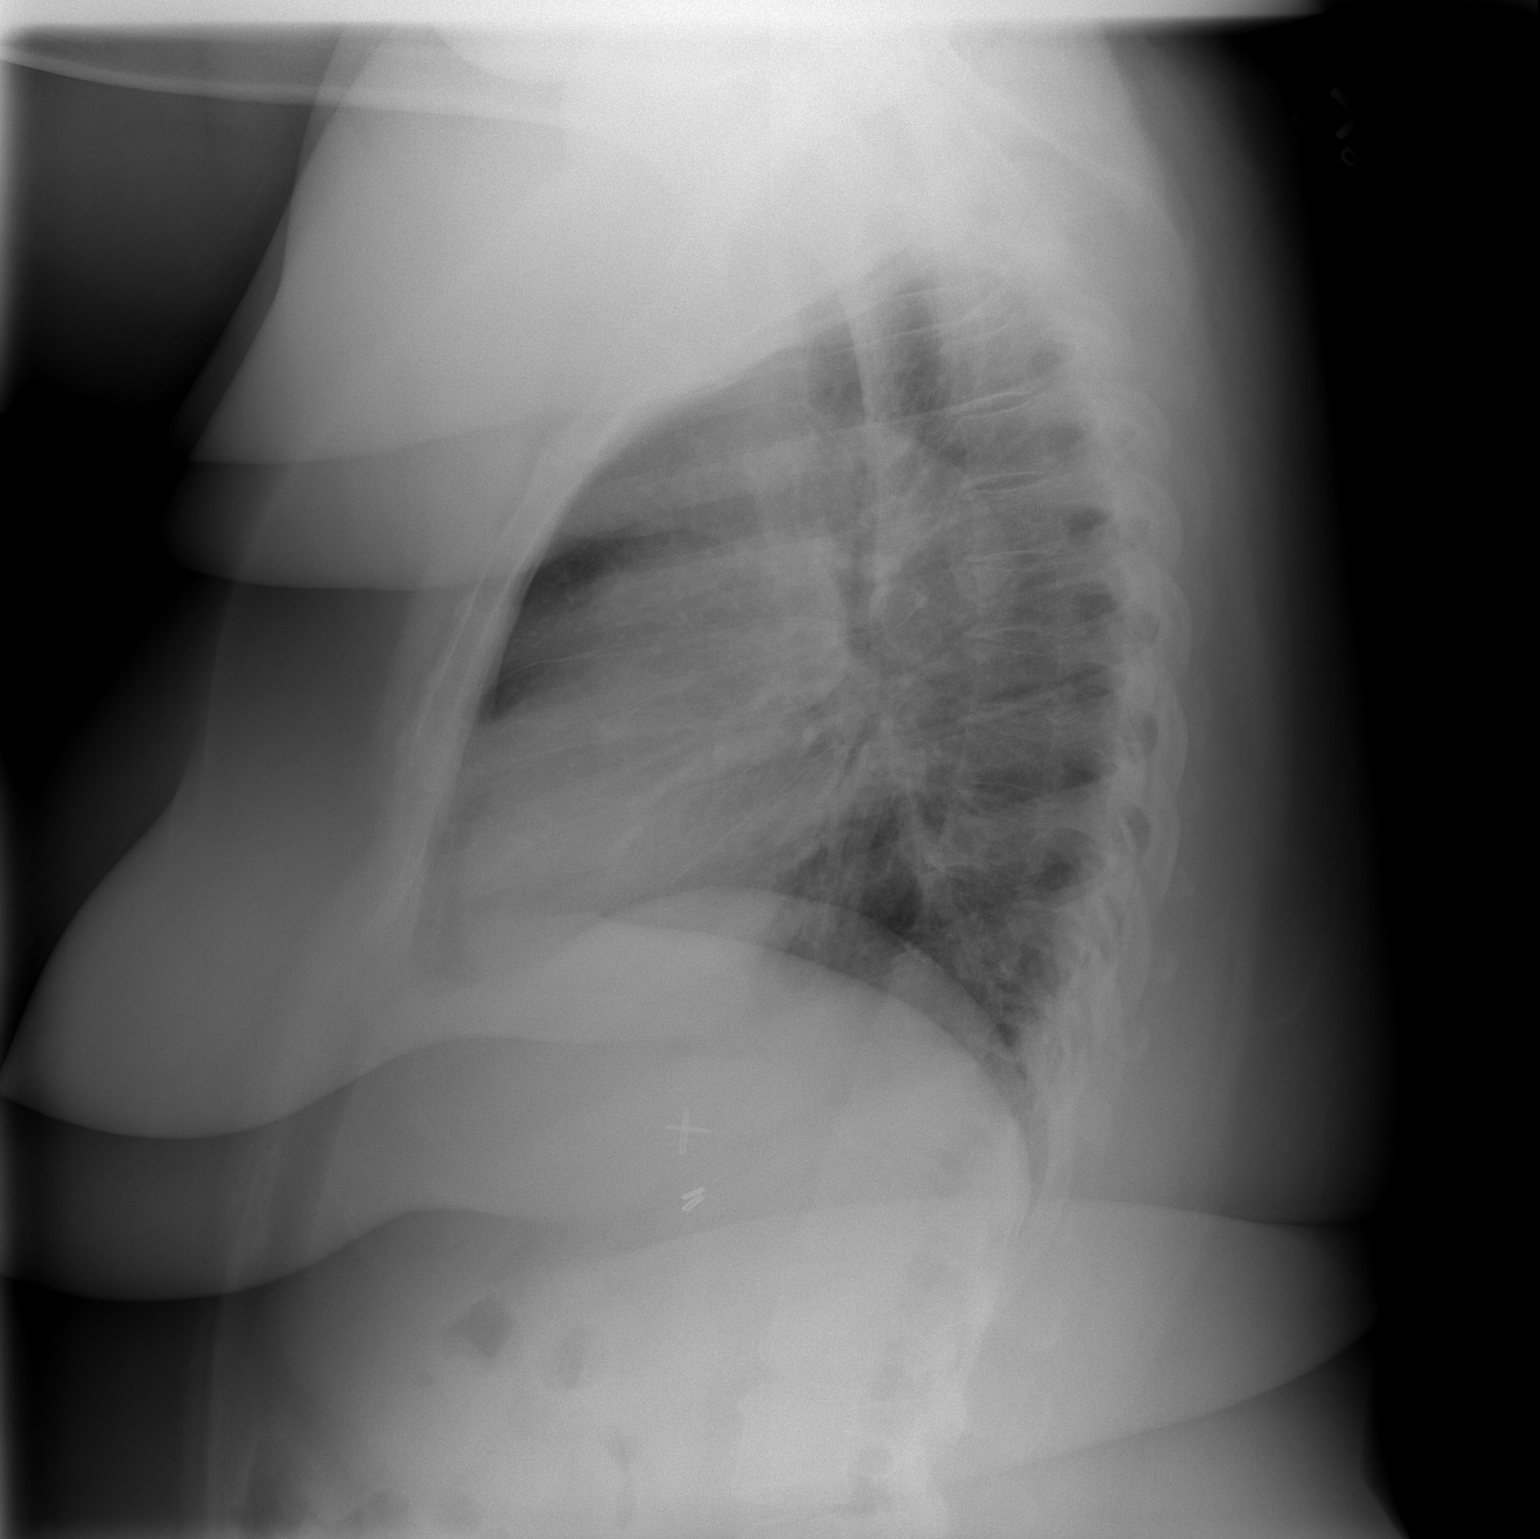

[2 of 2 positions shown; findings below may reference images not displayed]

FINDINGS: Cardiomediastinal silhouette is within normal limits.  There is minimal prominence to the bronchovascular markings without focal pulmonary opacity. No effusion.  No pneumothorax.  Osseous structures are intact.
IMPRESSION: Prominent bronchovascular markings, unchanged; given their chronicity, this may not reflect acute disease, although given the patient?s symptoms, findings could be consistent with small airways disease such as bronchitis.

## 2007-02-06 ENCOUNTER — Ambulatory Visit: Payer: Self-pay | Admitting: Internal Medicine

## 2007-02-10 ENCOUNTER — Emergency Department (HOSPITAL_COMMUNITY): Admission: EM | Admit: 2007-02-10 | Discharge: 2007-02-10 | Payer: Self-pay | Admitting: Emergency Medicine

## 2007-02-24 ENCOUNTER — Emergency Department (HOSPITAL_COMMUNITY): Admission: EM | Admit: 2007-02-24 | Discharge: 2007-02-24 | Payer: Self-pay | Admitting: Emergency Medicine

## 2007-03-16 IMAGING — CR DG CHEST 2V
2 series · 2 of 2 positions shown · non-contrast
Comparison: none

CLINICAL DATA: Shortness of breath.  History of asthma.  Cough and wheezing.
 CHEST - 2 VIEWS:

[view not recorded (1 of 2)]
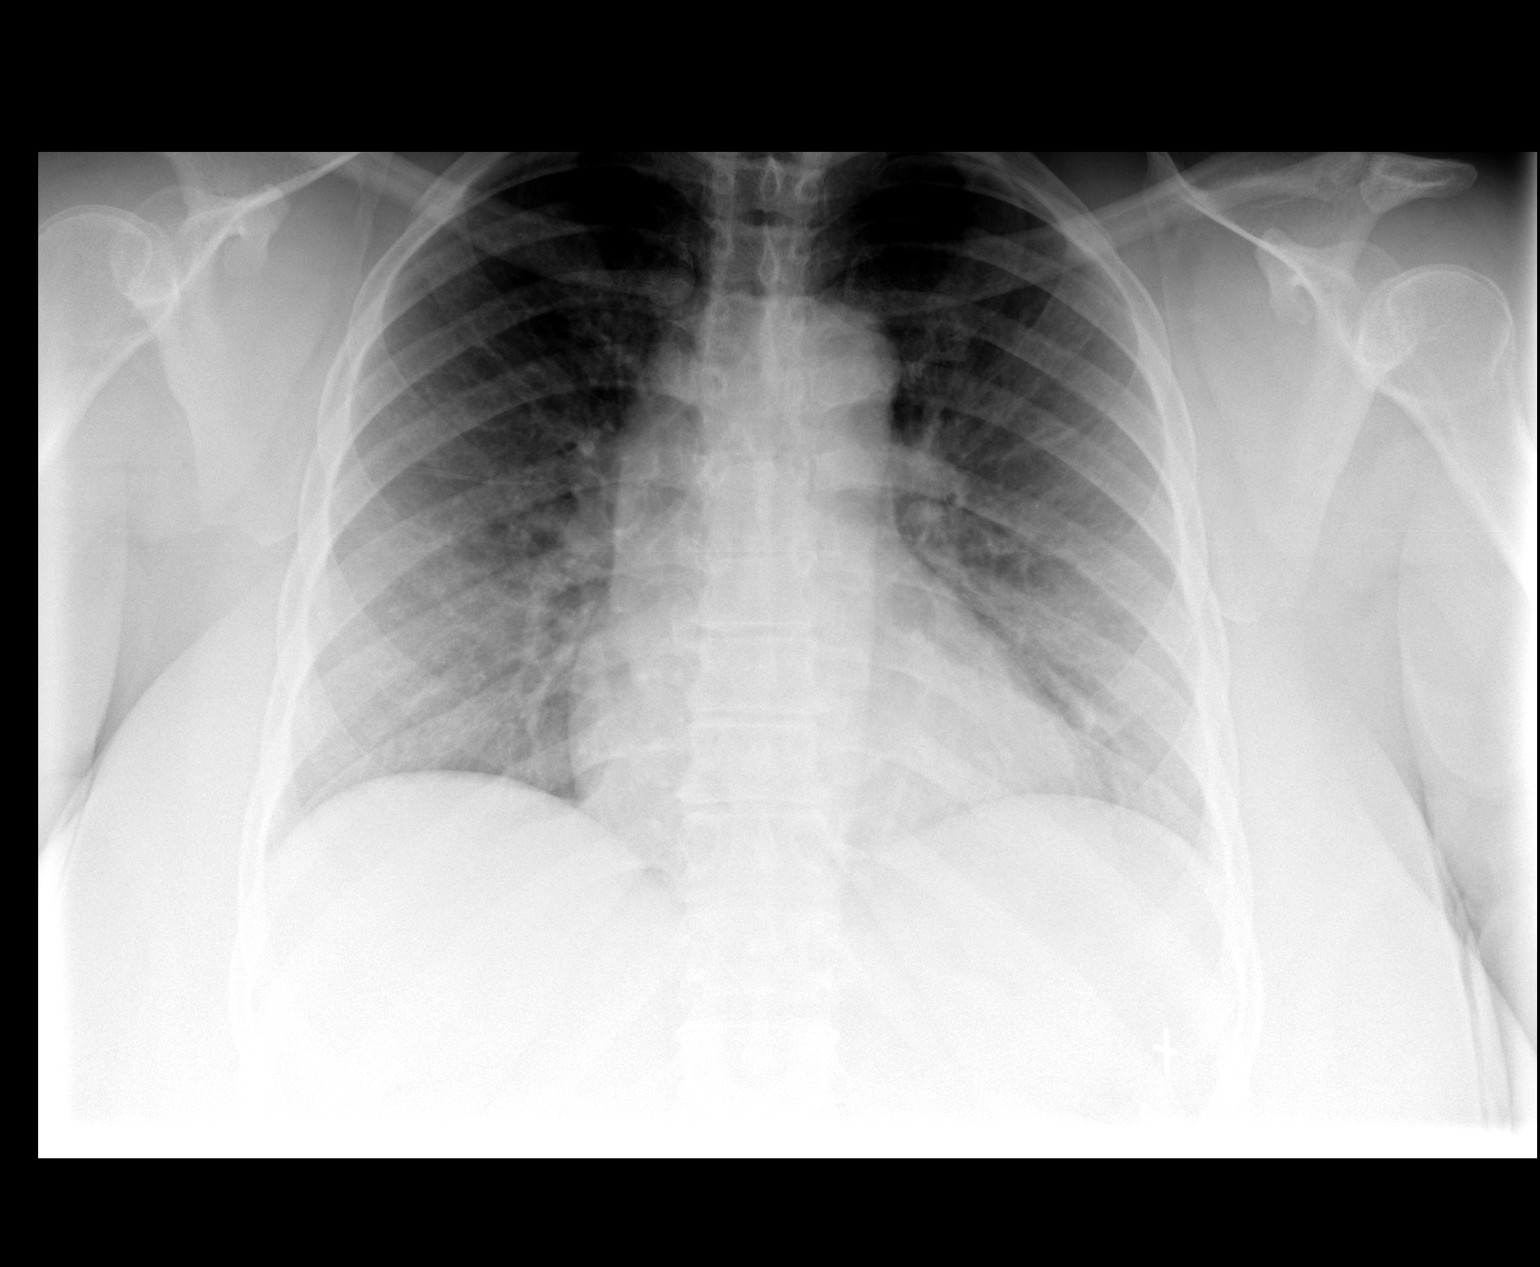

[view not recorded (2 of 2)]
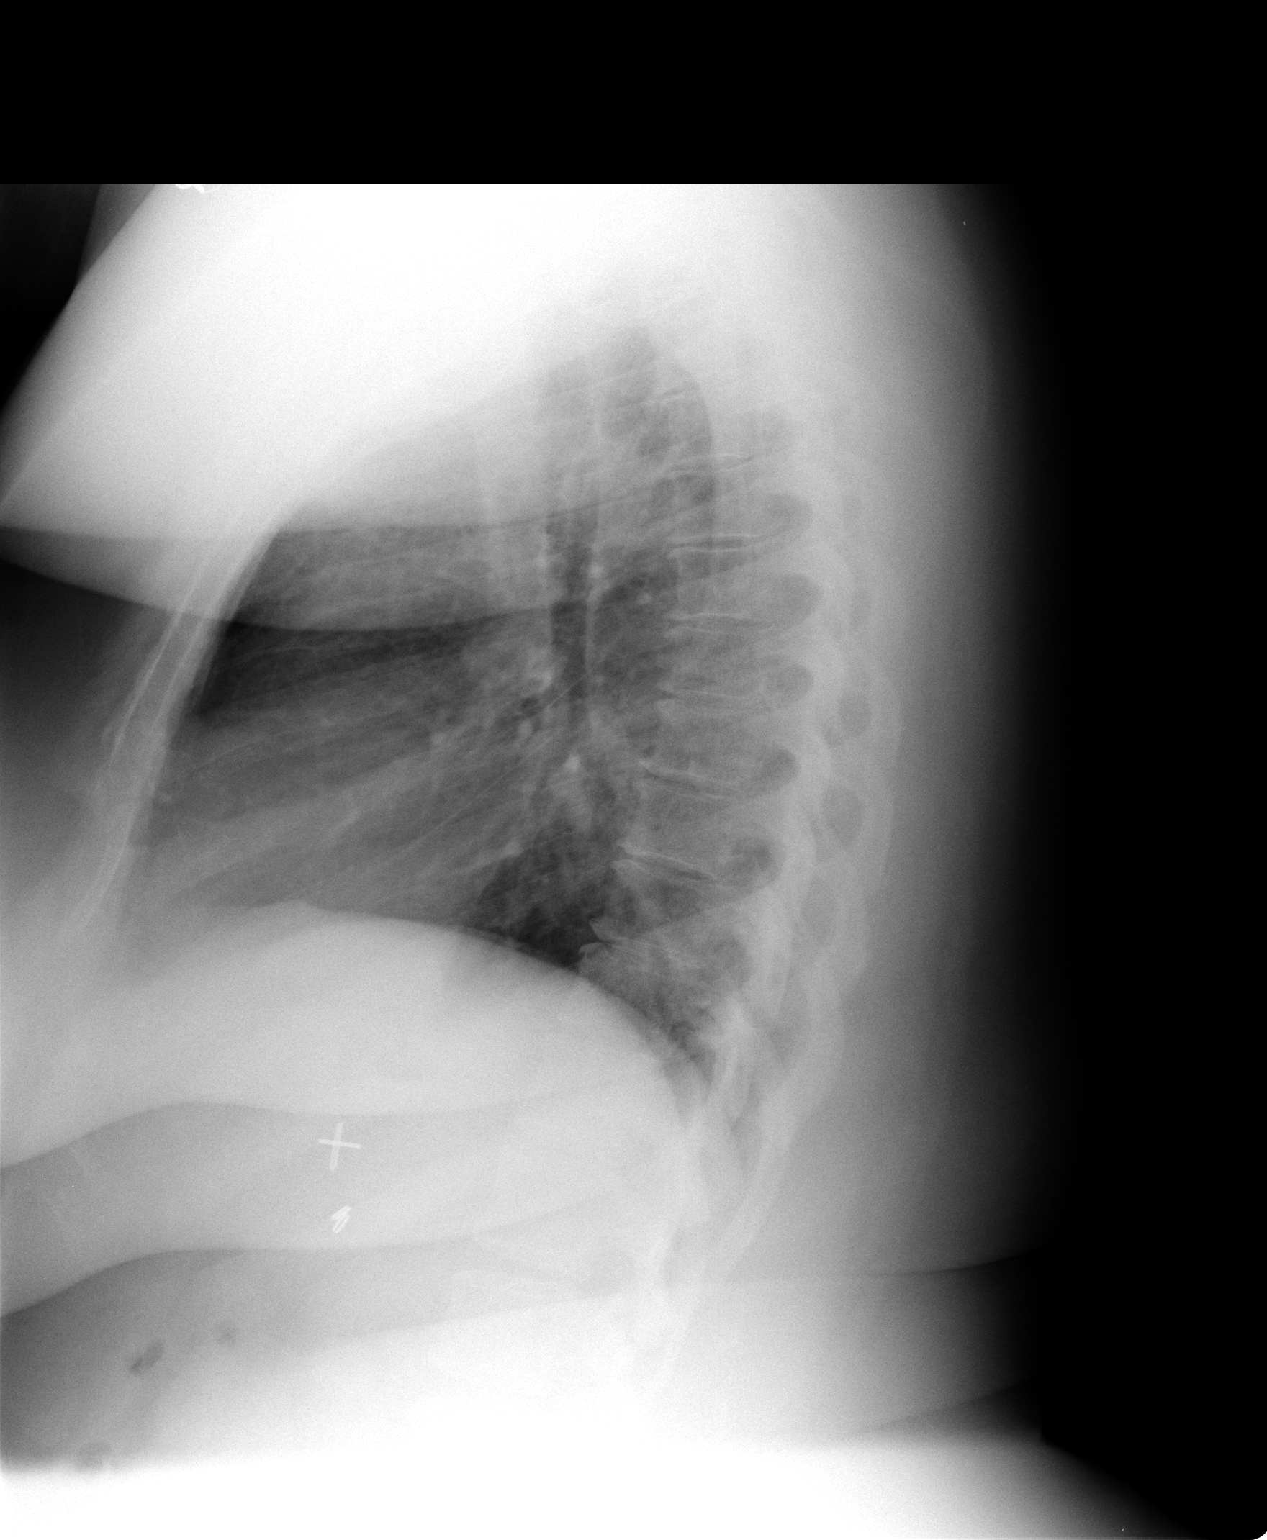

[2 of 2 positions shown; findings below may reference images not displayed]

FINDINGS: There are accentuated peribronchial markings with bronchial wall thickening again noted.  No focal infiltrate, consolidation, or atelectasis.
IMPRESSION: Chronic bronchitic changes.  No active infiltrate.

## 2007-04-05 IMAGING — CR DG CHEST 2V
2 series · 2 of 2 positions shown · non-contrast
Comparison: 07/05/05.

CLINICAL DATA: Chest pain.  Right arm pain.  Cough.  Smoking history.
 CHEST- 2 VIEW:

[w chest pa]
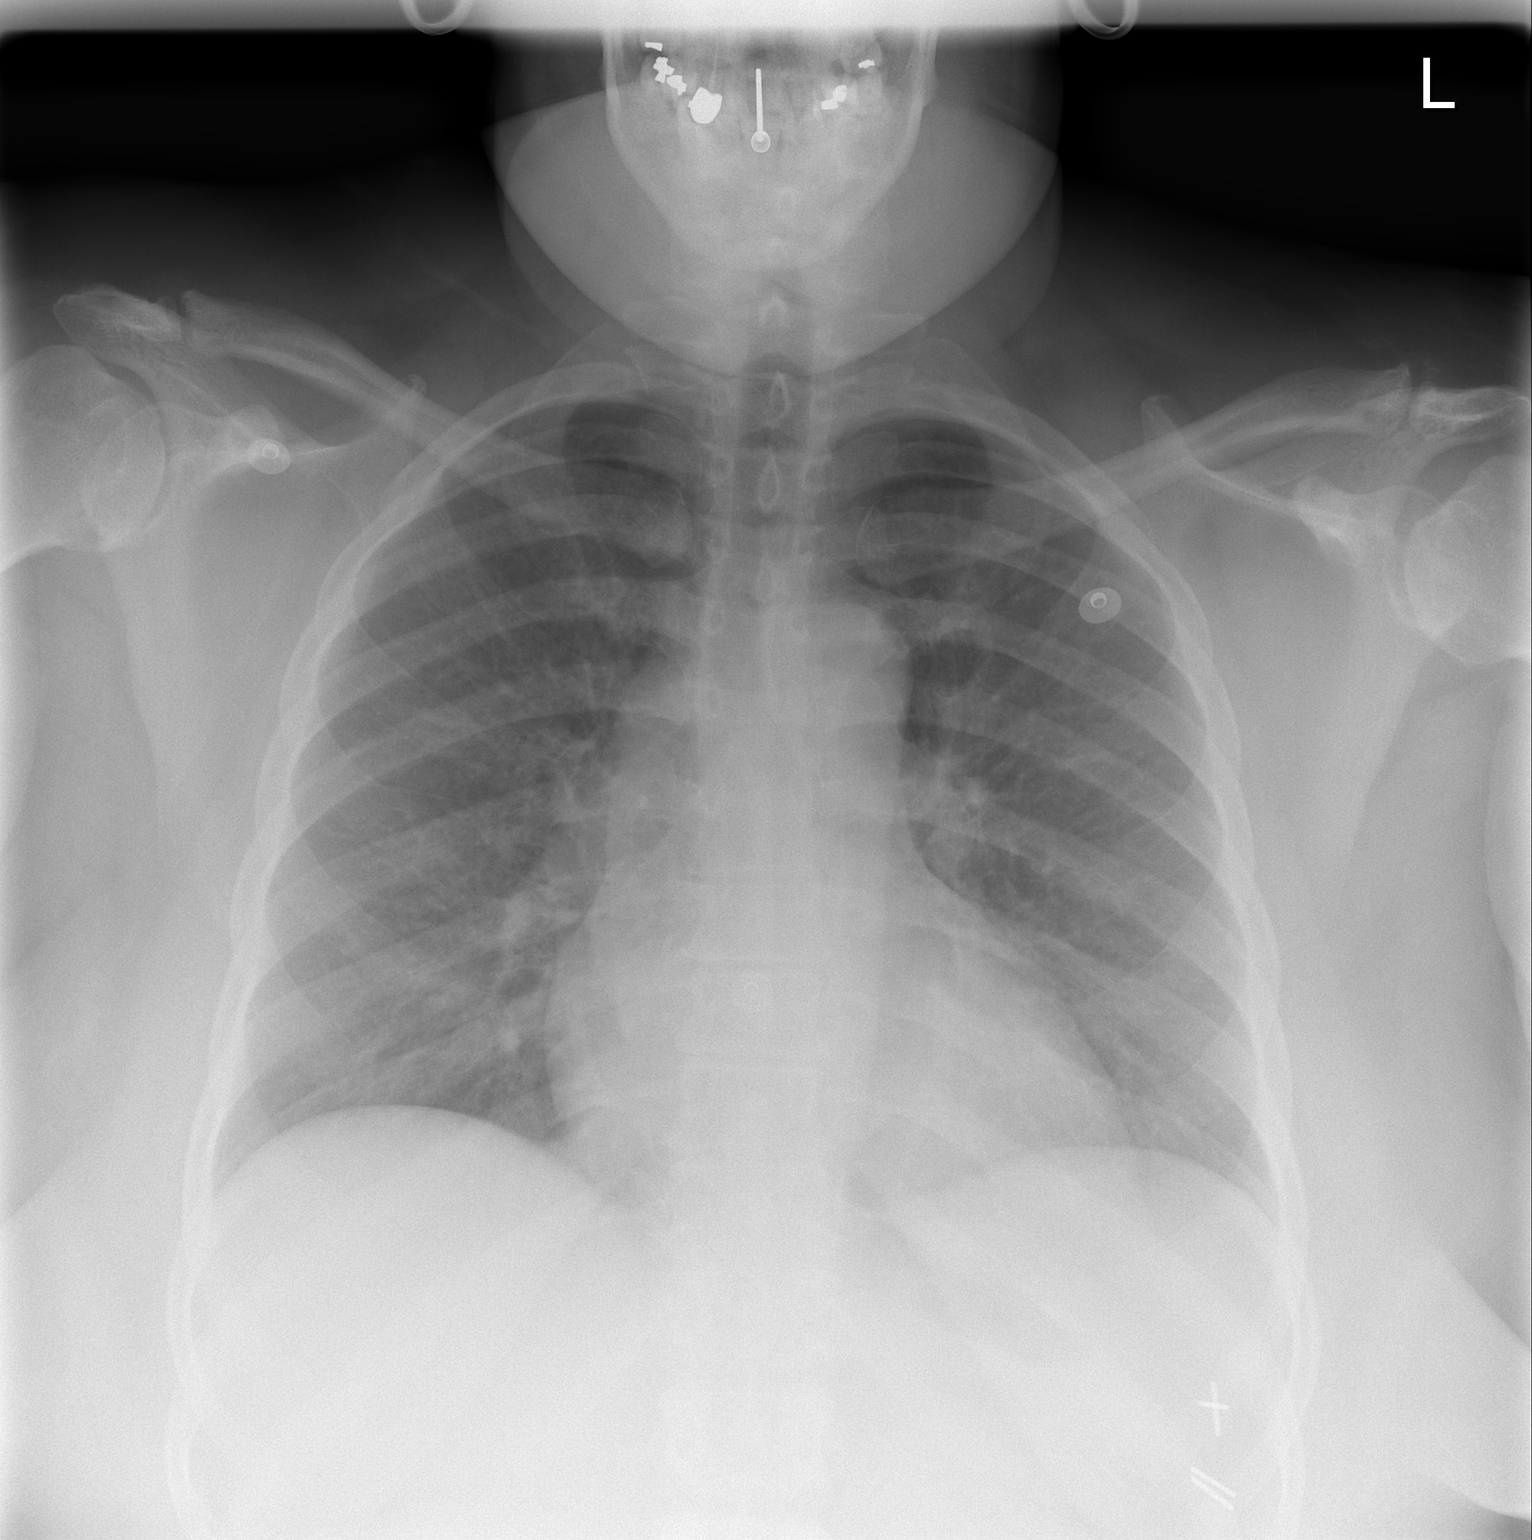

[w chest lat]
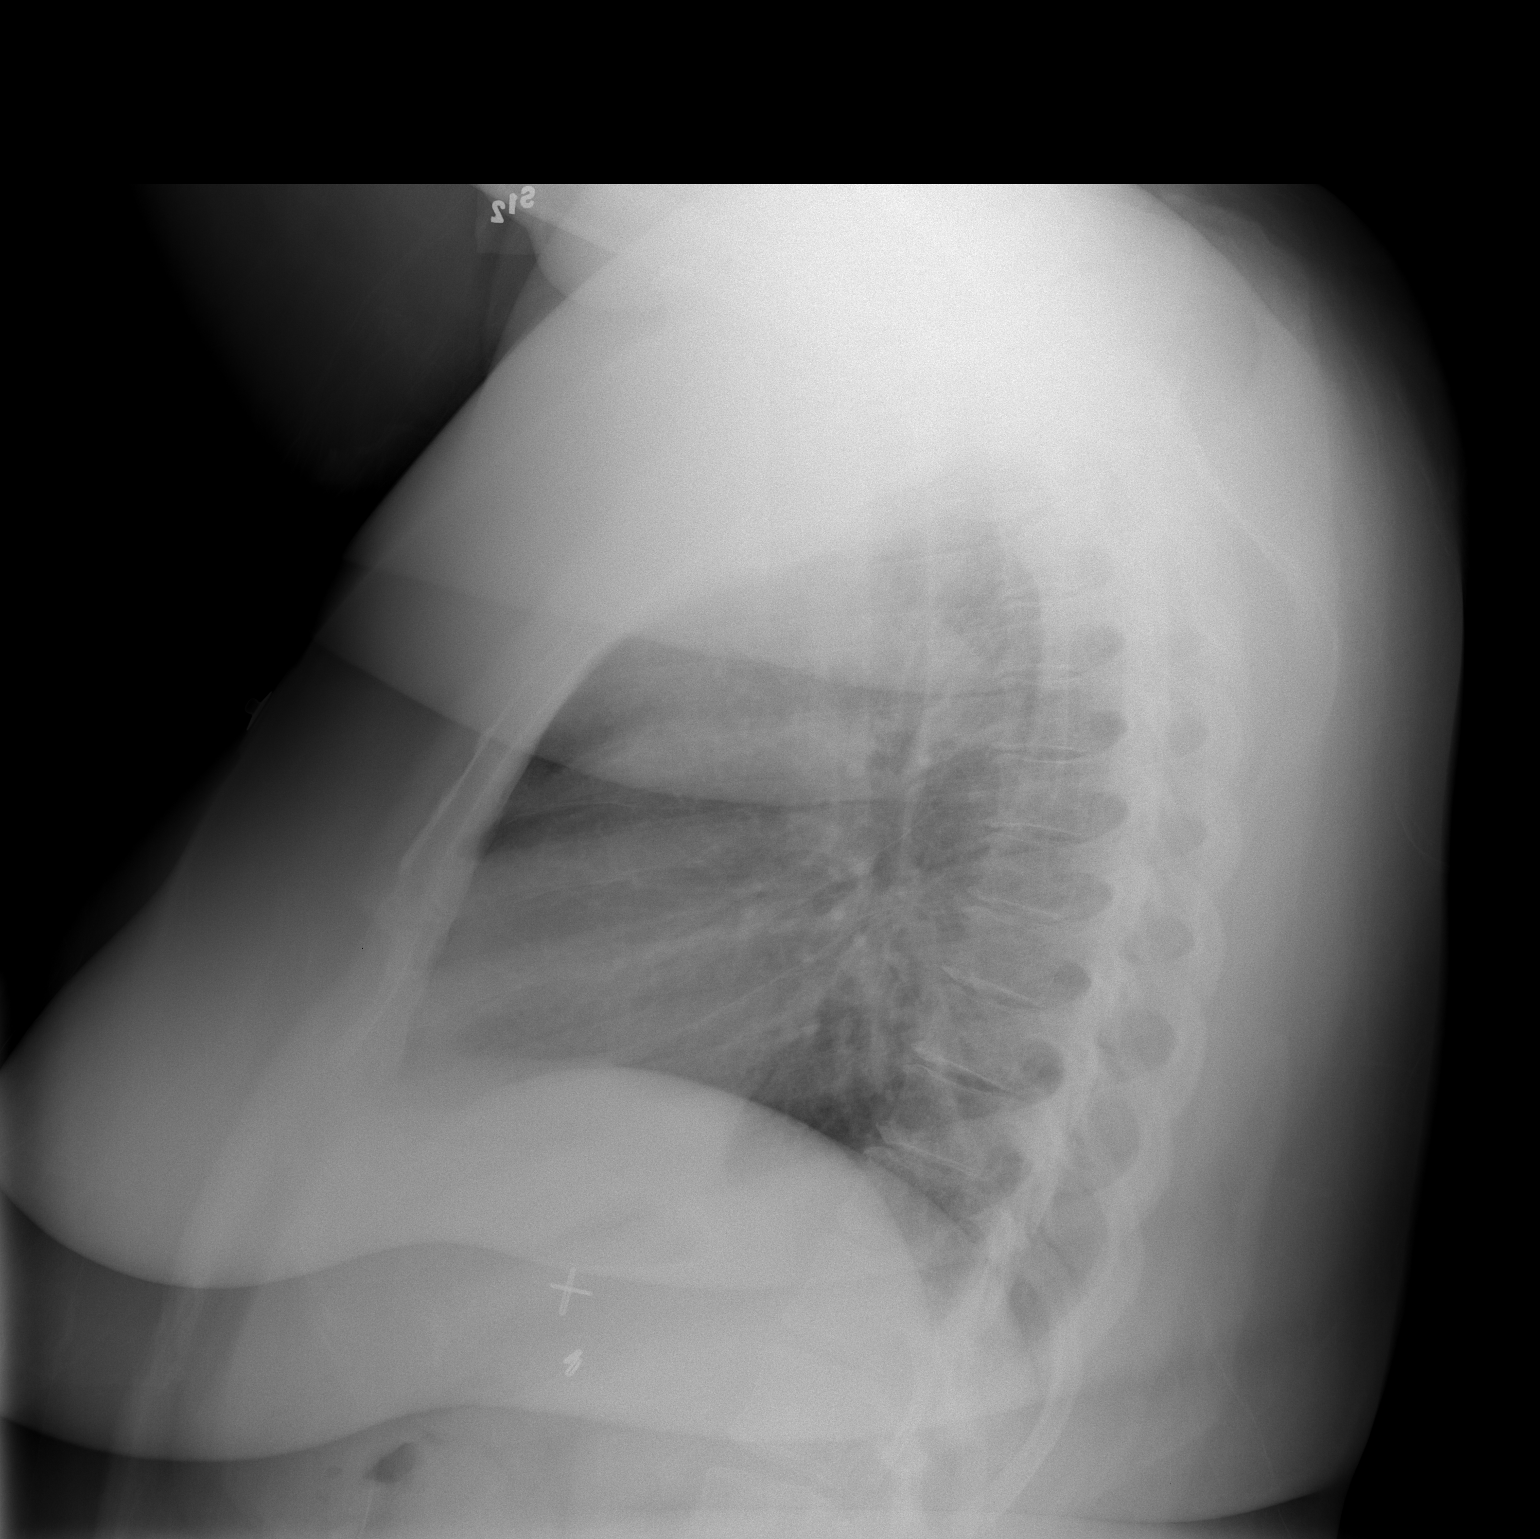

[2 of 2 positions shown; findings below may reference images not displayed]

FINDINGS: The heart is at the upper limits of normal in size.  The mediastinum is unremarkable.  The films are slightly under penetrated and allowing for this, I think the lungs are clear.  No effusions.  No soft tissue or bony abnormality.
IMPRESSION: No active disease.

## 2007-04-21 IMAGING — CR DG HIP COMPLETE 2+V*R*
3 series · 3 of 3 positions shown · non-contrast
Comparison: none

CLINICAL DATA: Right hip pain.    
 RIGHT HIP WITH PELVIS - 3 VIEW:
 Mild degenerative spurring of the right hip joint is seen without significant joint space narrowing.  There is no evidence of fracture or dislocation.  No other bone abnormality identified.

[t hip ap left]
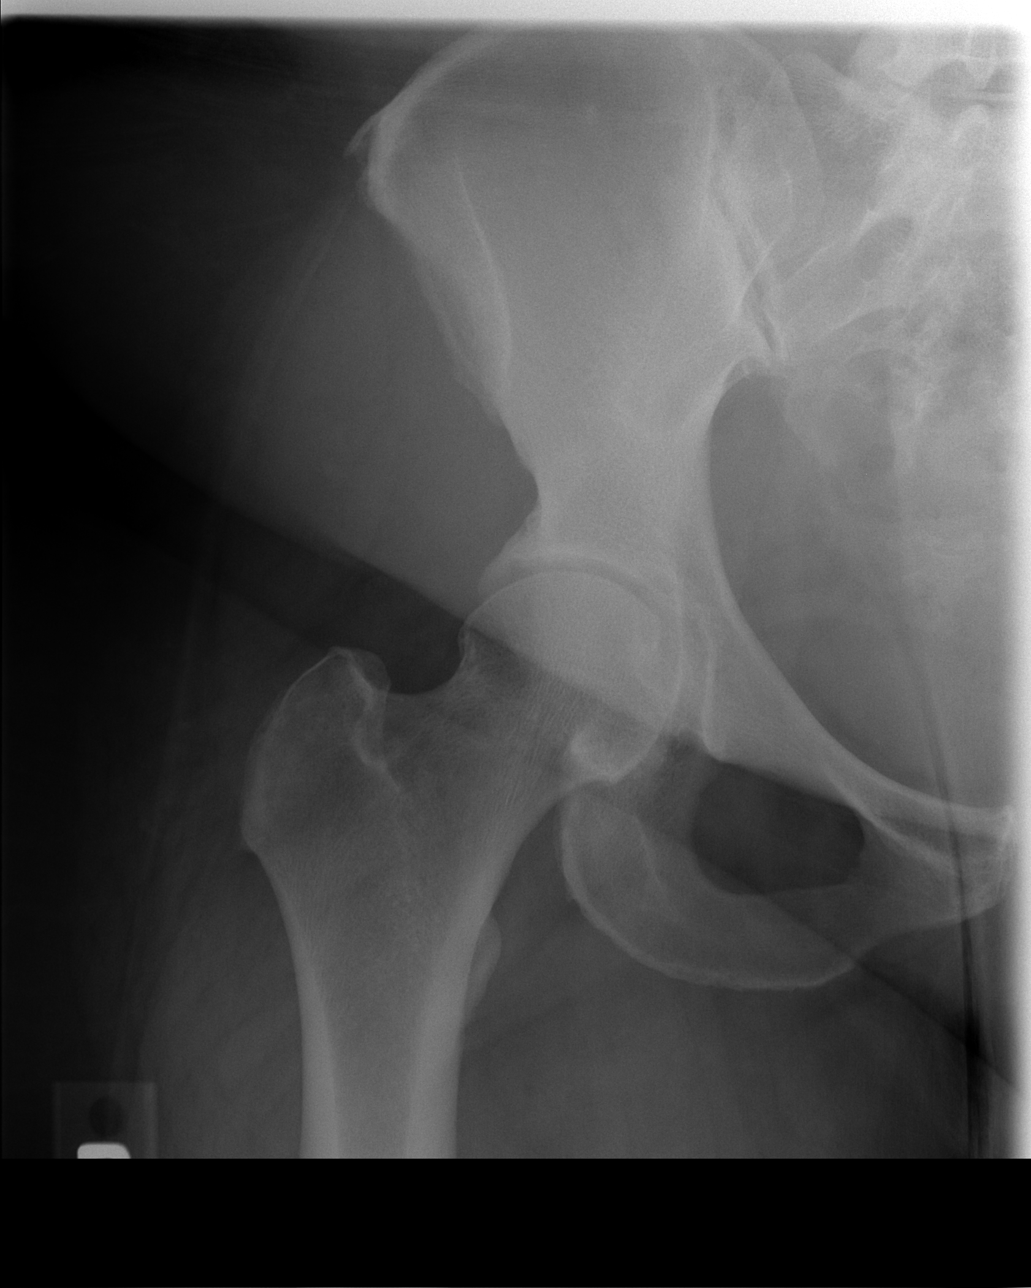

[t hip frog leg left *]
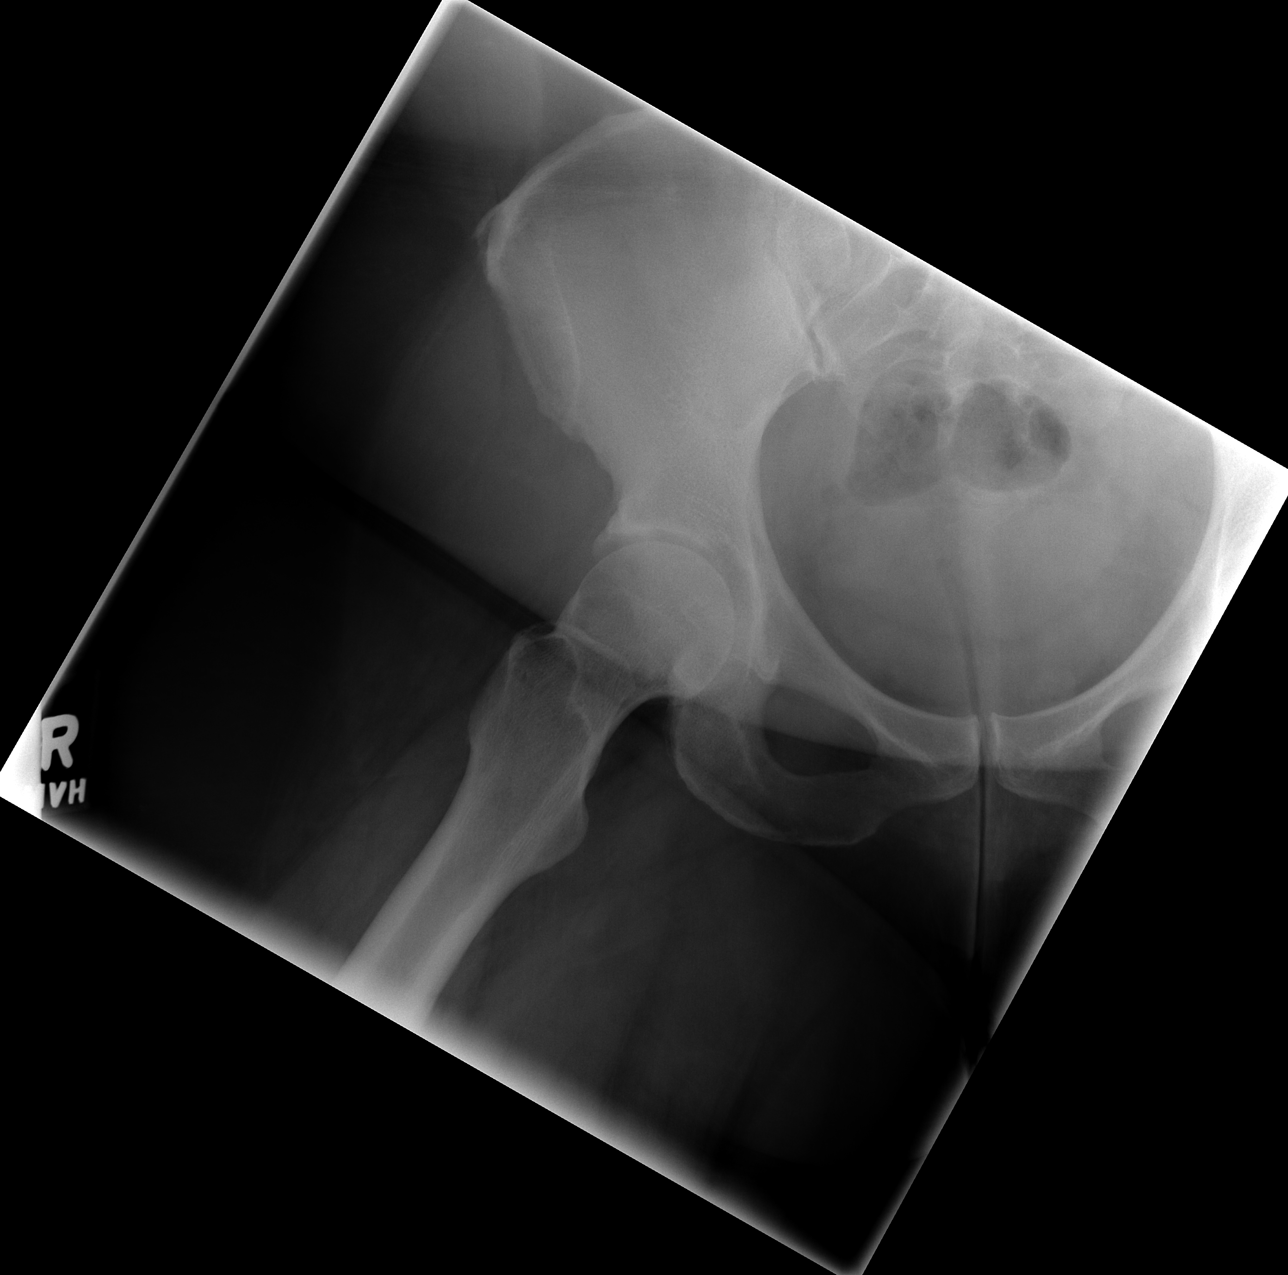

[t pelvis a.p. *]
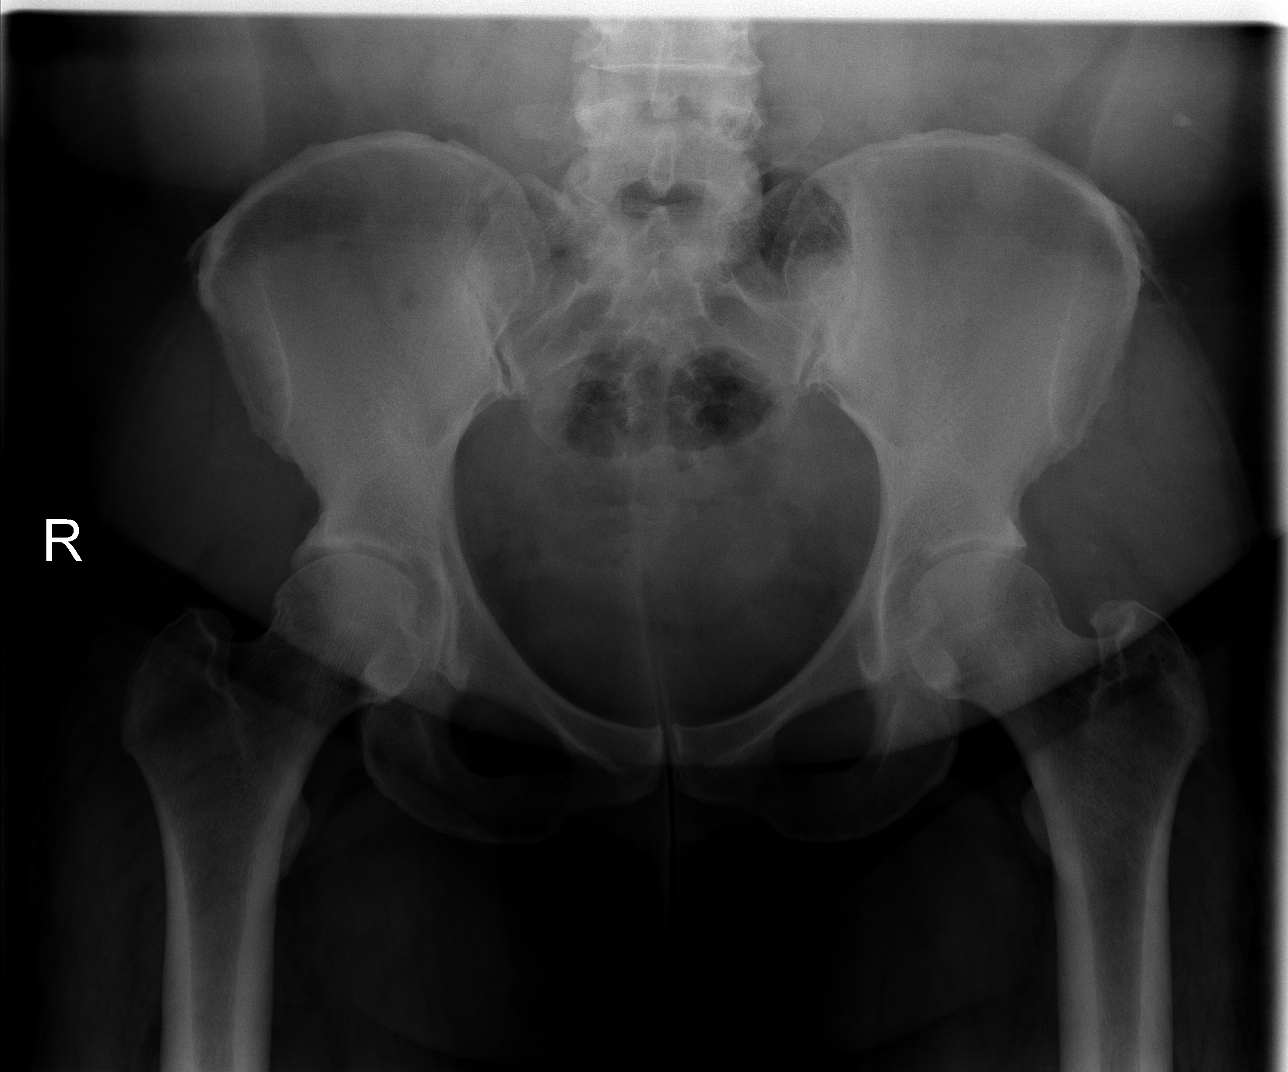

[3 of 3 positions shown; findings below may reference images not displayed]

IMPRESSION: 1.  No acute findings.
 2.  Mild right hip osteoarthritis.

## 2007-06-29 IMAGING — CR DG CHEST 2V
2 series · 2 of 2 positions shown · non-contrast
Comparison: 07/25/05
 The heart size and mediastinal contours are within normal limits.  Both lungs are clear.  The visualized skeletal structures are unremarkable.

CLINICAL DATA: Cough and wheezing for three days.  History of asthmatic bronchitis.  
 CHEST - 2 VIEW:

[view not recorded (1 of 2)]
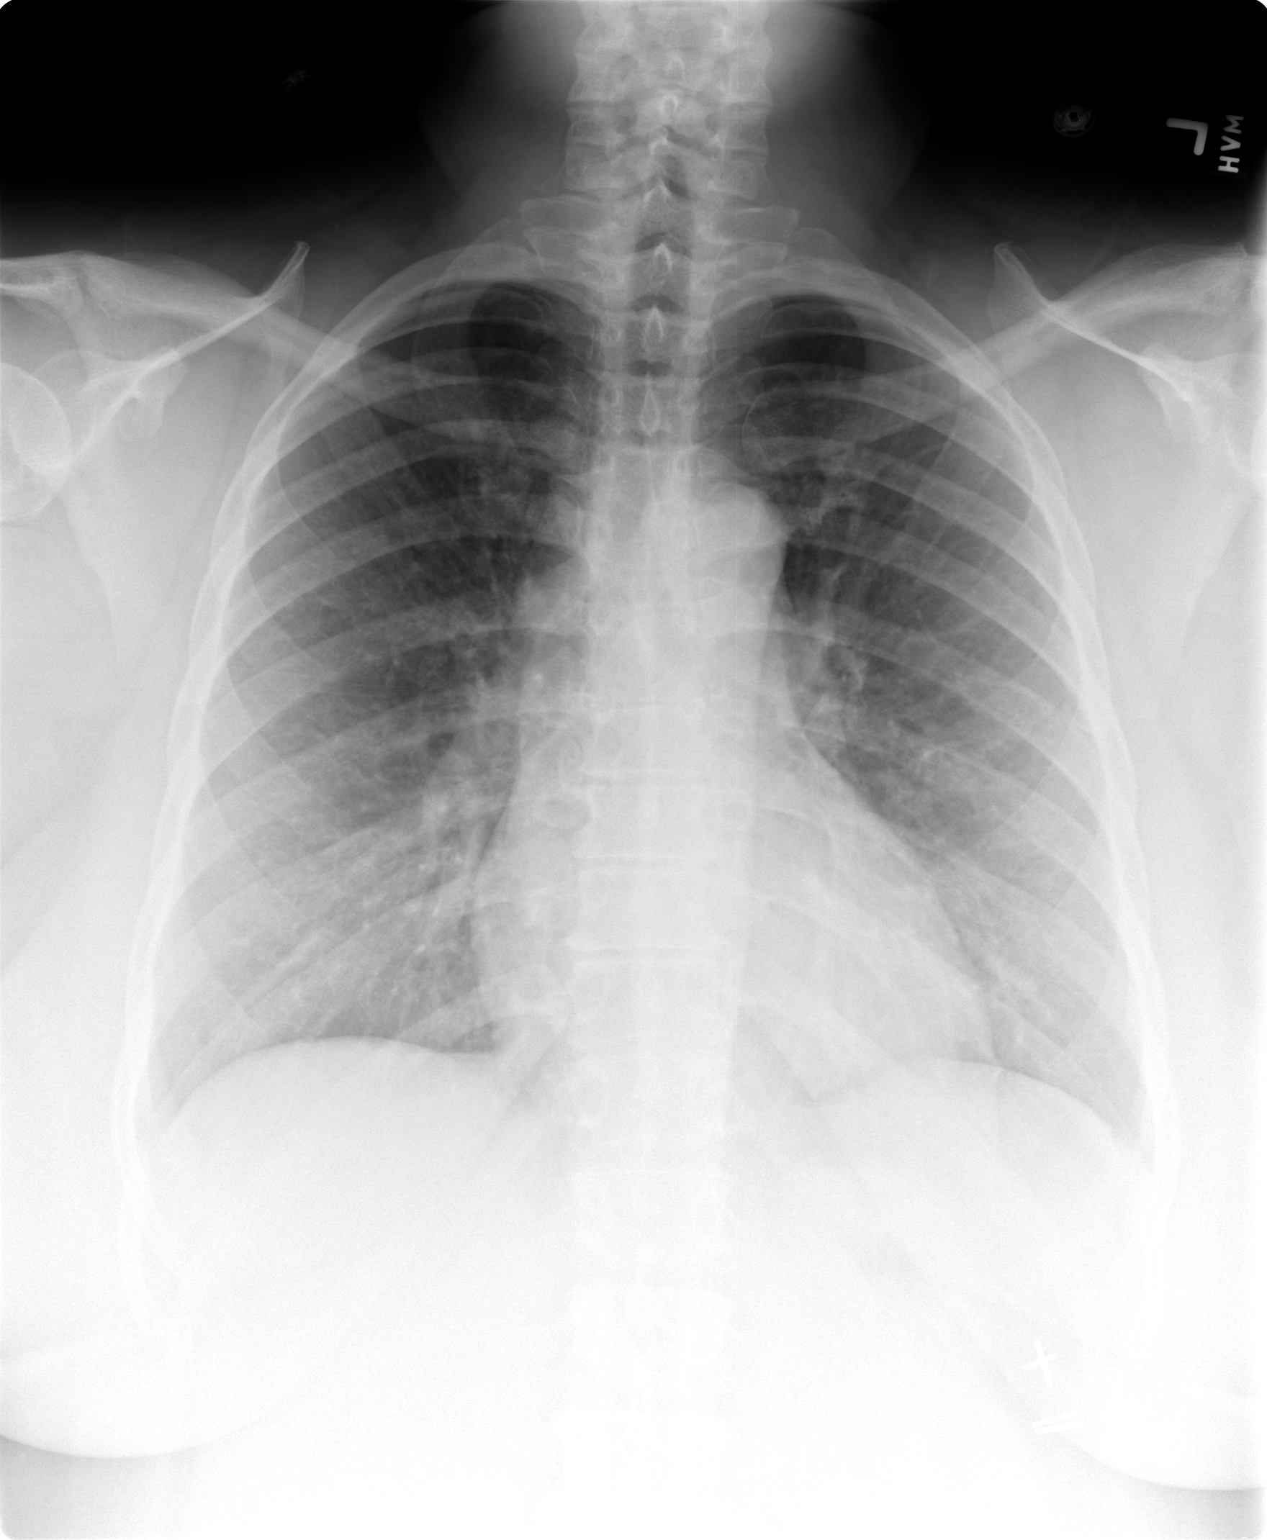

[view not recorded (2 of 2)]
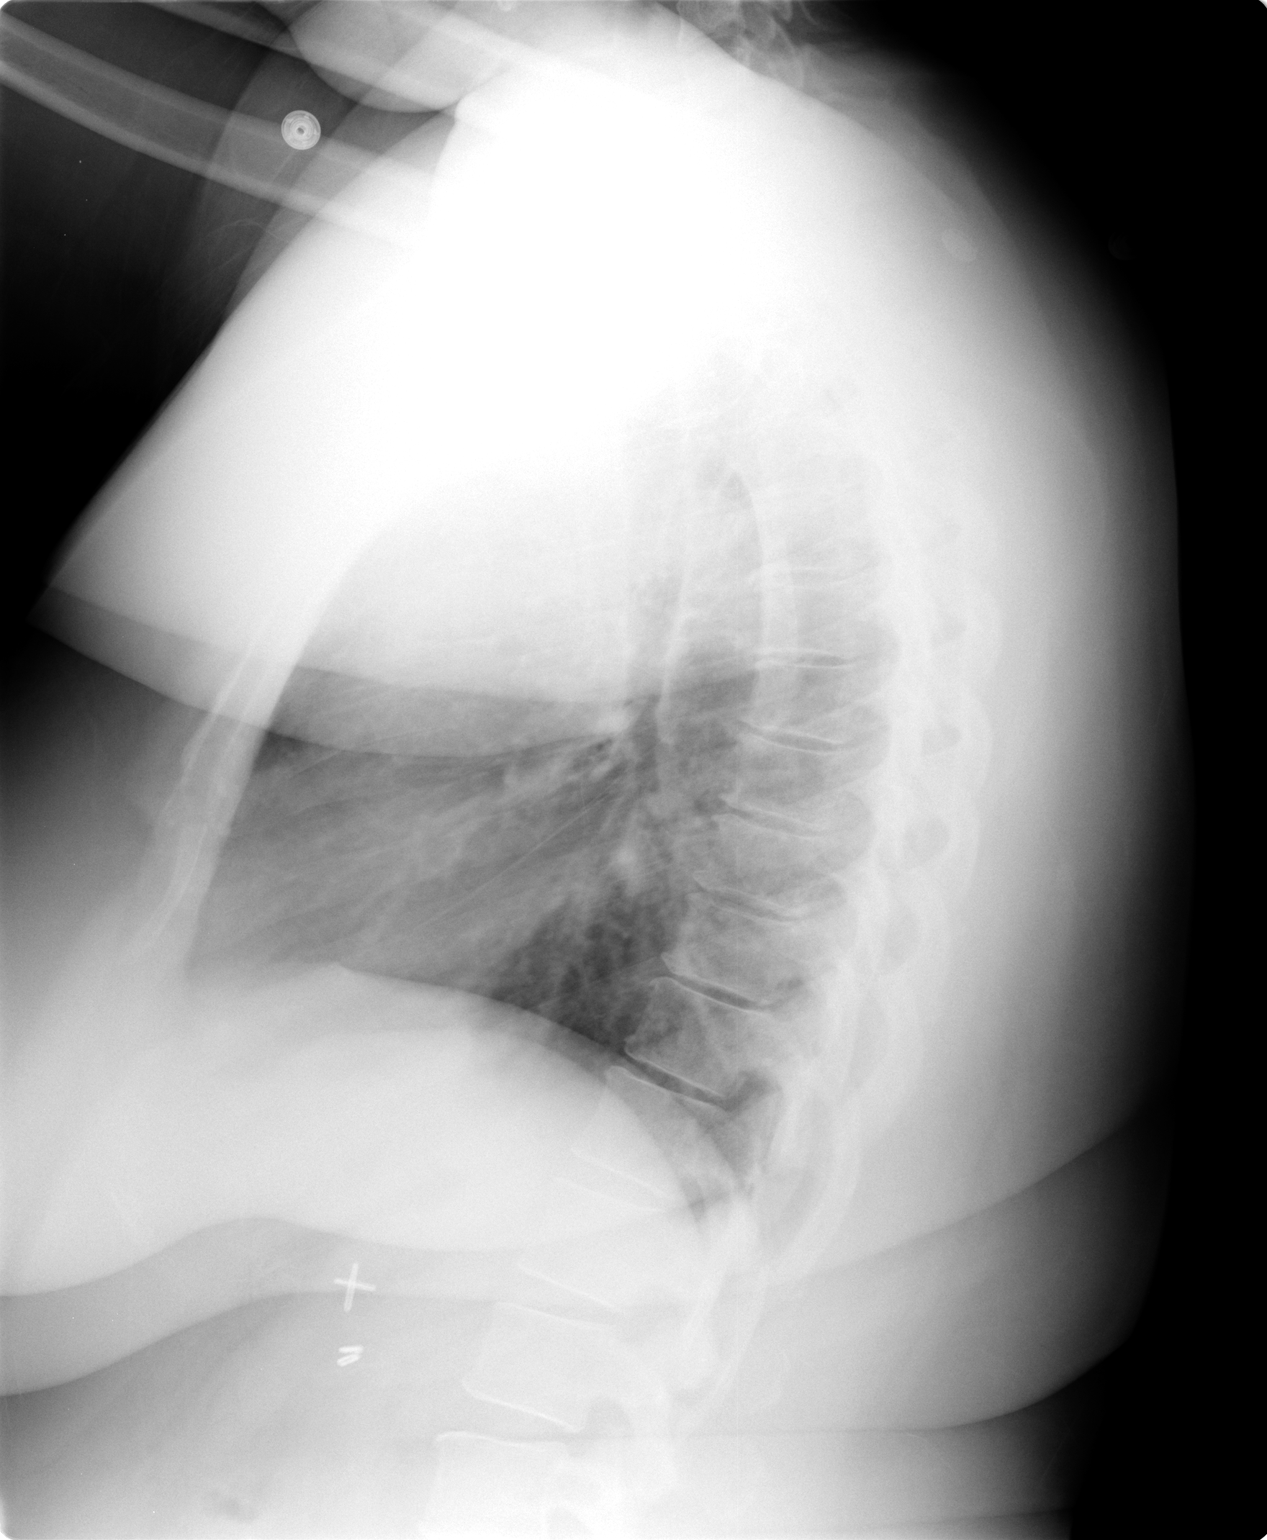

[2 of 2 positions shown; findings below may reference images not displayed]

IMPRESSION: No active cardiopulmonary disease.

## 2007-08-27 ENCOUNTER — Inpatient Hospital Stay (HOSPITAL_COMMUNITY): Admission: EM | Admit: 2007-08-27 | Discharge: 2007-08-28 | Payer: Self-pay | Admitting: Emergency Medicine

## 2007-10-27 ENCOUNTER — Encounter: Admission: RE | Admit: 2007-10-27 | Discharge: 2007-10-27 | Payer: Self-pay | Admitting: Obstetrics

## 2007-10-27 IMAGING — US US PELVIS COMPLETE MODIFY
1 series · 13 of 25 positions shown · non-contrast
Comparison: none

Addendum BeginsOriginal report by Dr. Narda.  Following addendum by Dr. Queku Zigani on 02/17/06.
 The ultrasound was reviewed at Dr. Casco?Van Thien request.  The 2 cm cyst on the left ovary is a simple cyst, and no followup of this is necessary. 

 Addendum Ends
CLINICAL DATA: 50-year-old female.  Vaginal discharge and irritation.  Nausea.  Uterus enlarged on exam. 
TRANSABDOMINAL AND TRANSVAGINAL PELVIC ULTRASOUND:
TECHNIQUE: Both transabdominal and transvaginal ultrasound examinations of the pelvis were performed including evaluation of the uterus, ovaries, adnexal regions, and pelvic cul-de-sac.

[Series 1: us pelvis complete modify · 0.33mm/px · 13 of 35 slices shown]
[im 1/35]
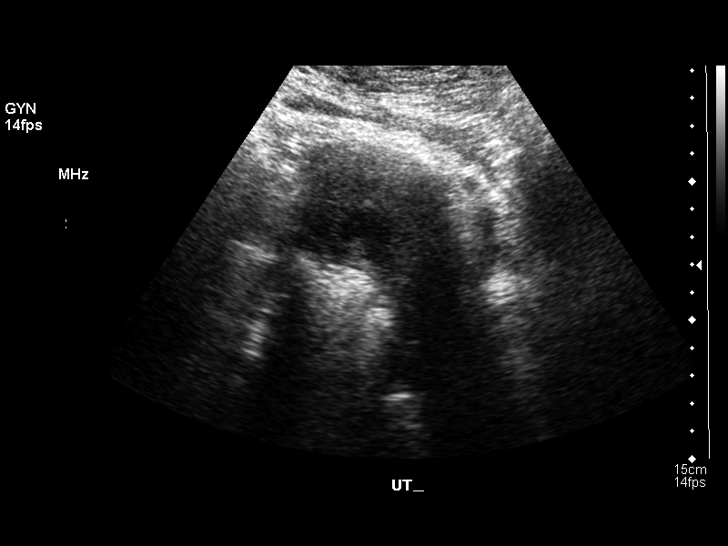
[im 3/35]
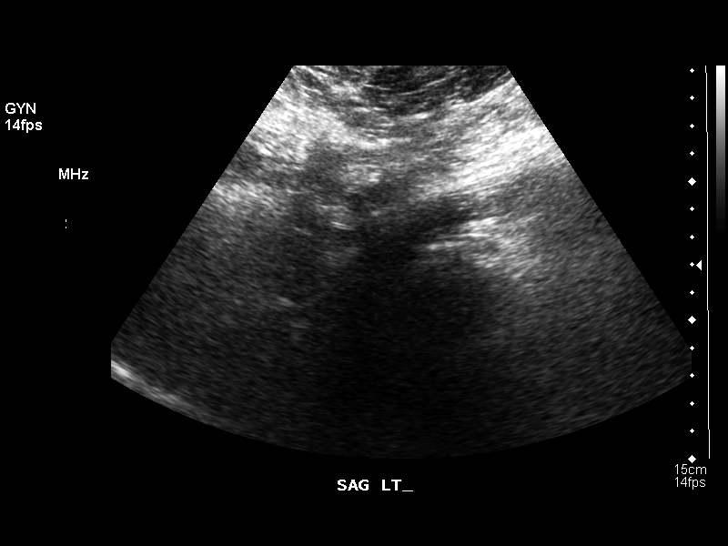
[im 6/35]
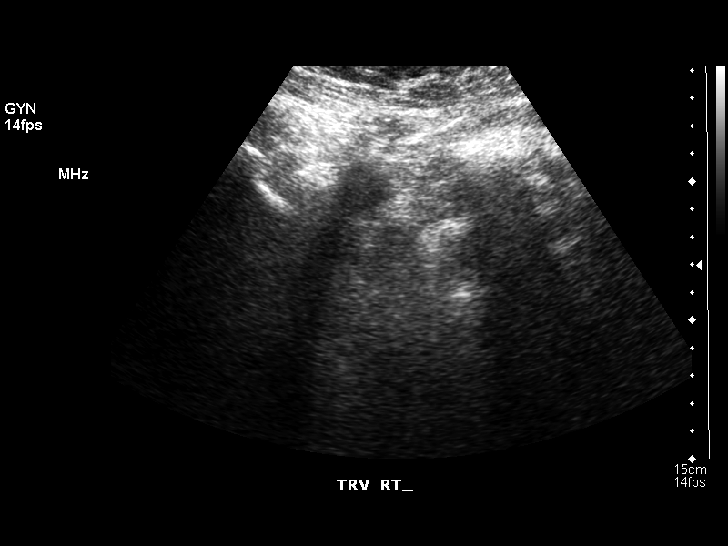
[im 9/35]
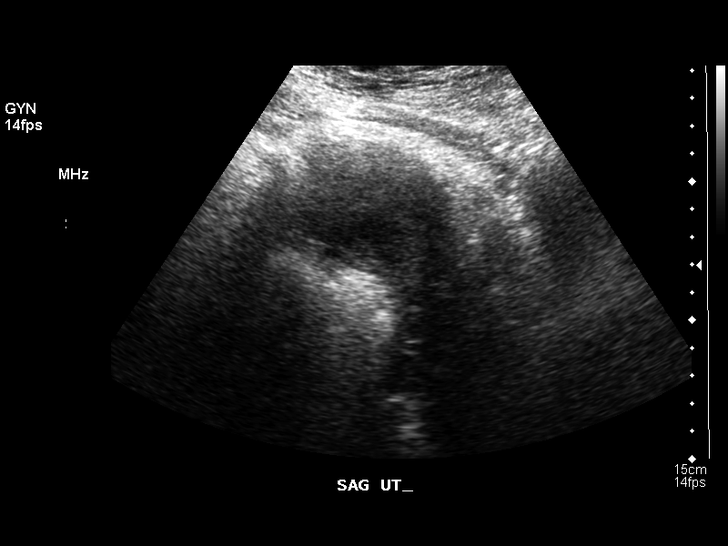
[im 12/35]
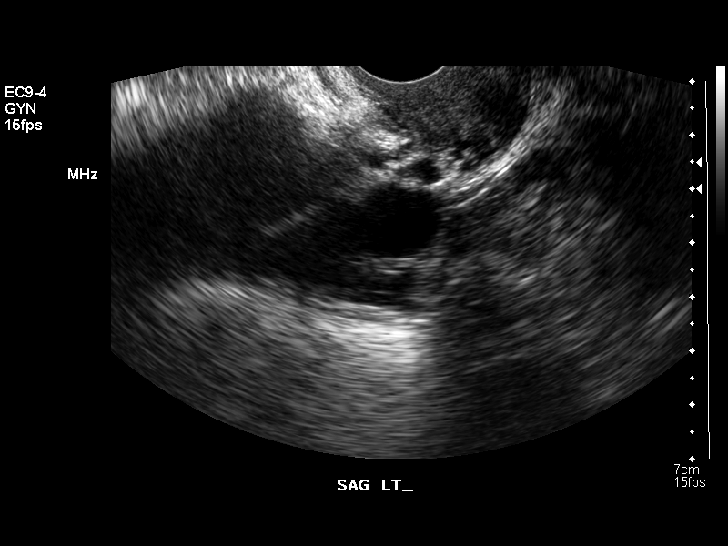
[im 15/35]
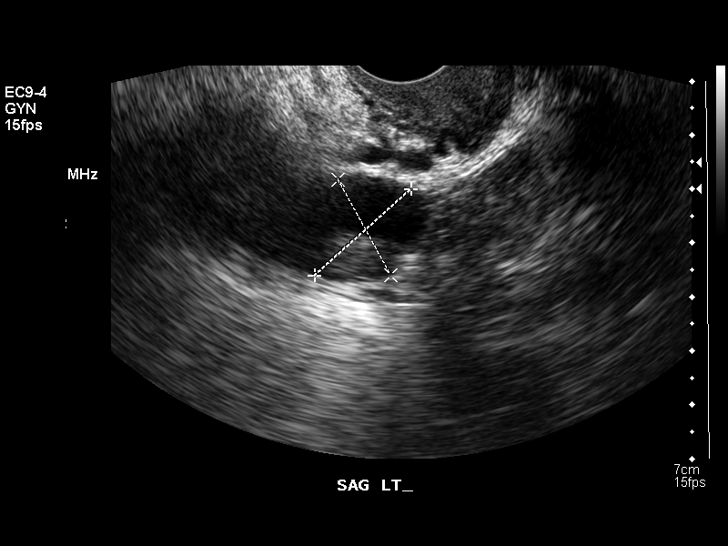
[im 18/35]
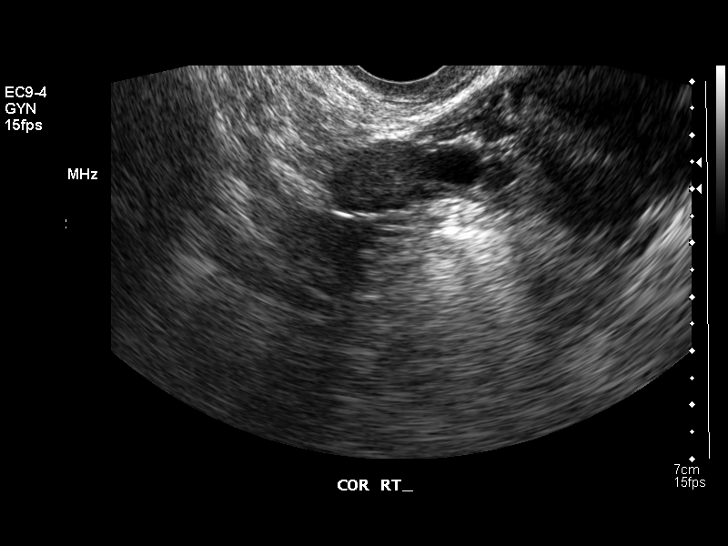
[im 20/35]
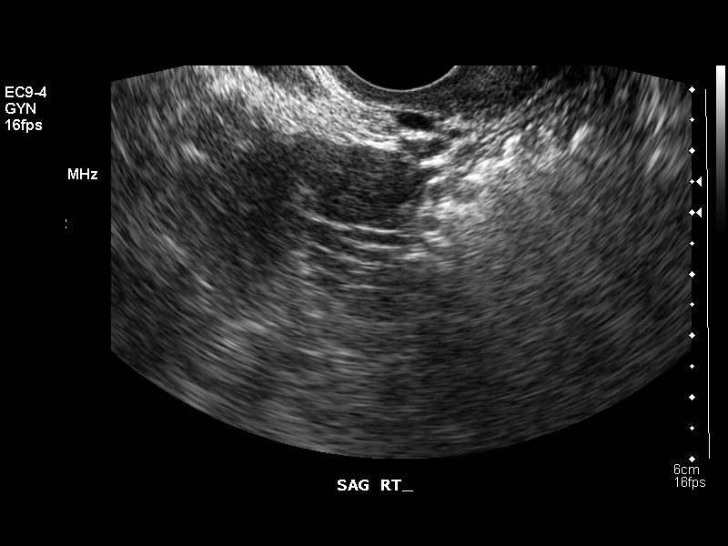
[im 23/35]
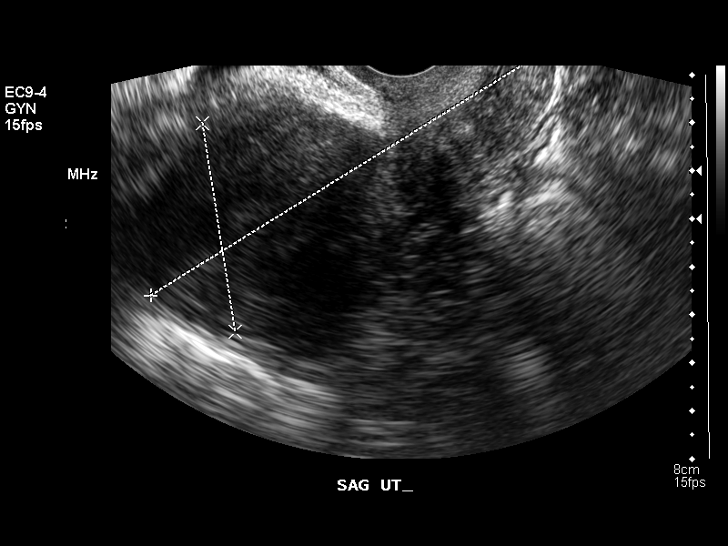
[im 26/35]
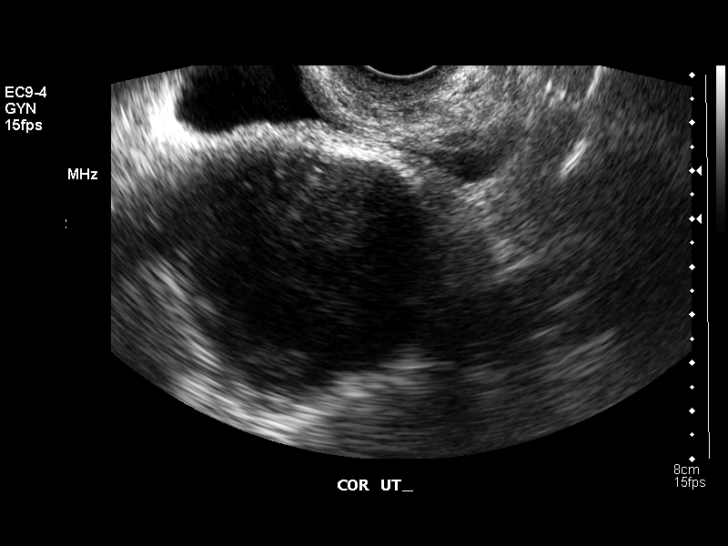
[im 29/35]
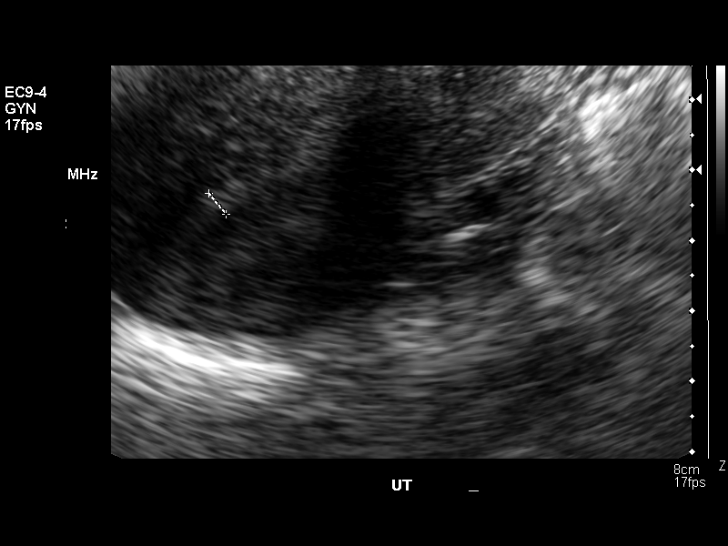
[im 32/35]
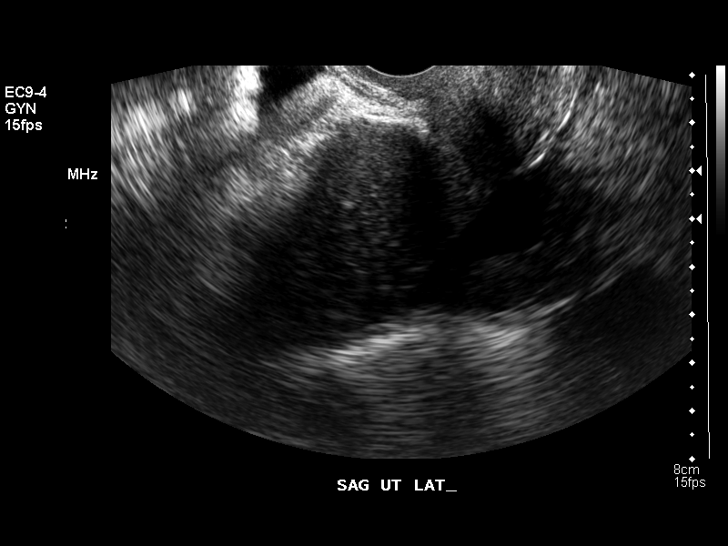
[im 35/35]
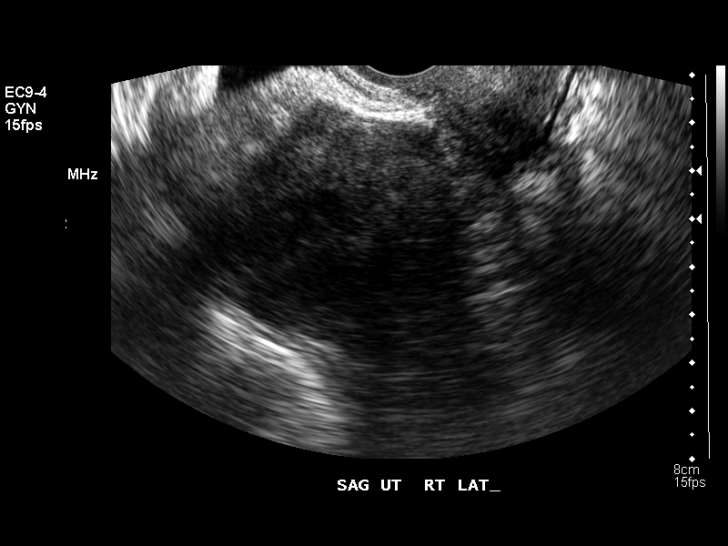

[13 of 25 positions shown; findings below may reference images not displayed]

FINDINGS: Transabdominal views are obtained without a full bladder, limiting the study.  Endovaginal views are also somewhat limited as the patient was uncomfortable during the exam.  No focal lesions are seen.  The uterus has a normal size measuring 9.6 x 4.4 x 5.1 cm.  Ovaries are normal bilaterally measuring 2.0 x 1.3 x 2.8 on the right and 2.4 x 2.0 x 2.2 cm on the left.  
Prominent cyst on the left ovary measures 2.0 x 1.5 cm.  There is no significant free fluid.
IMPRESSION: 1.  Normal appearance of the uterus without focal lesion identified.  
2.  2.0 cm cyst left ovary.

## 2007-11-08 IMAGING — US US TRANSVAGINAL NON-OB
1 series · 18 of 25 positions shown · non-contrast
Comparison: 02/15/06.

CLINICAL DATA: Pelvic pain.  Previous history of colon cancer.  Follow-up left adnexal cyst.  Postmenopausal.  
TRANSABDOMINAL AND TRANSVAGINAL PELVIC ULTRASOUND:
TECHNIQUE: Both transabdominal and transvaginal ultrasound examinations of the pelvis were performed including evaluation of the uterus, ovaries, adnexal regions, and pelvic cul-de-sac.

[Series 1: us transvaginal non-ob · 18 of 36 slices shown]
[im 1/36]
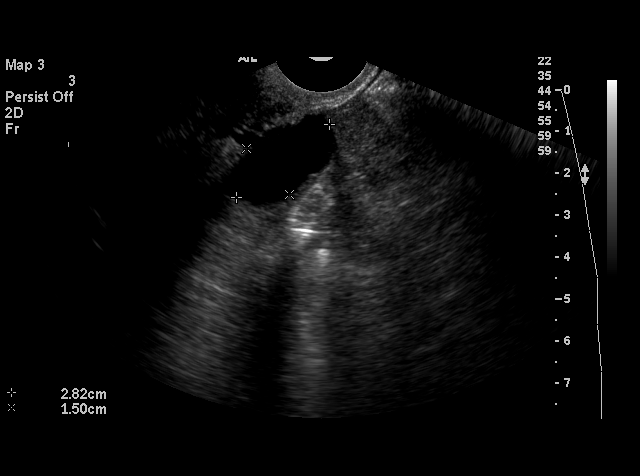
[im 3/36]
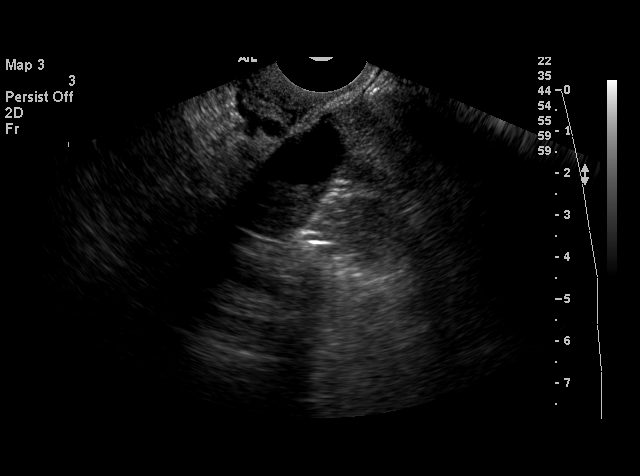
[im 5/36]
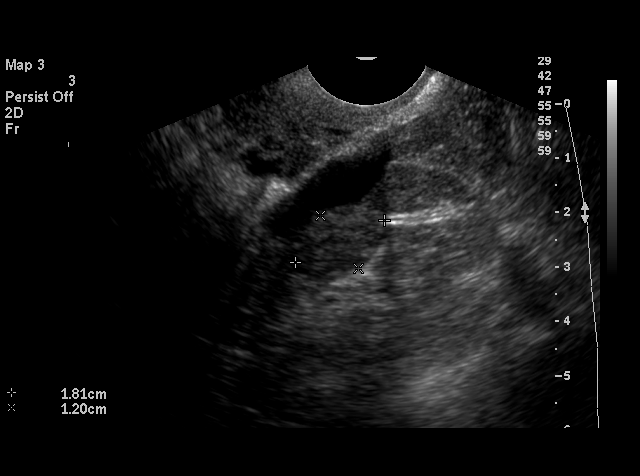
[im 6/36]
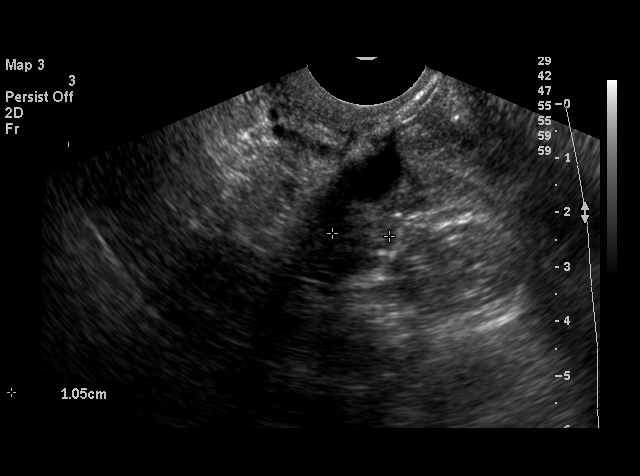
[im 9/36]
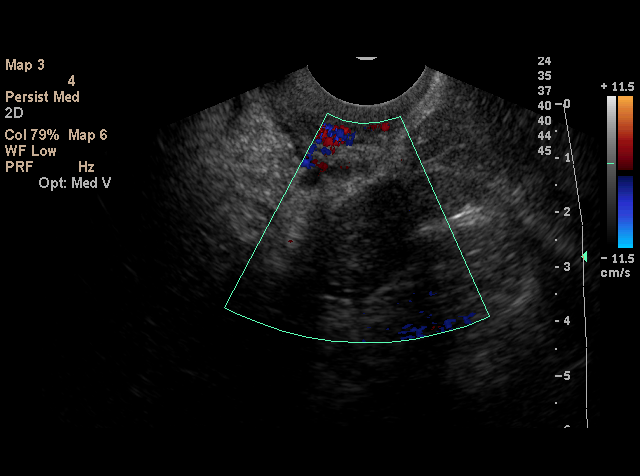
[im 11/36]
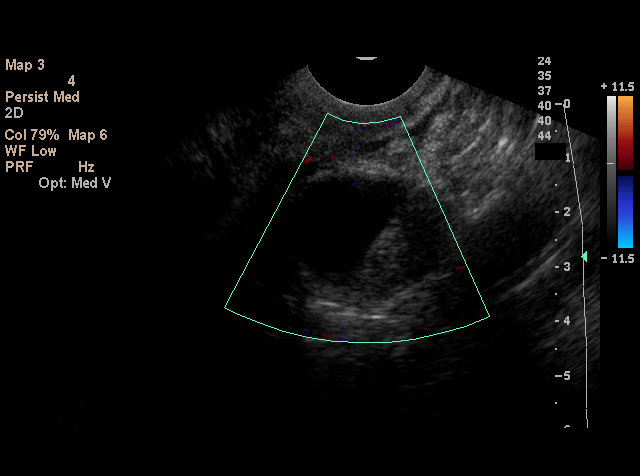
[im 14/36]
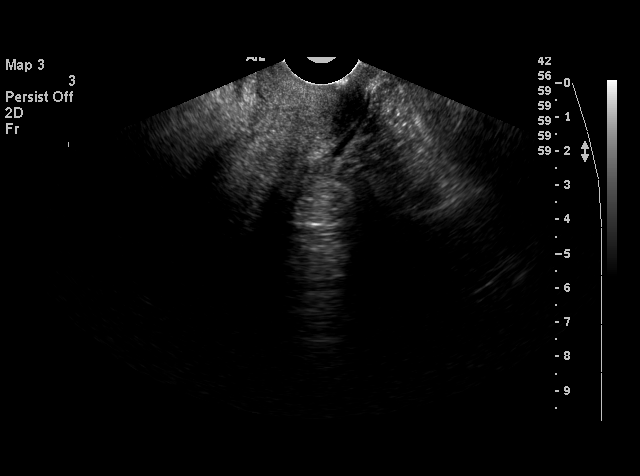
[im 15/36]
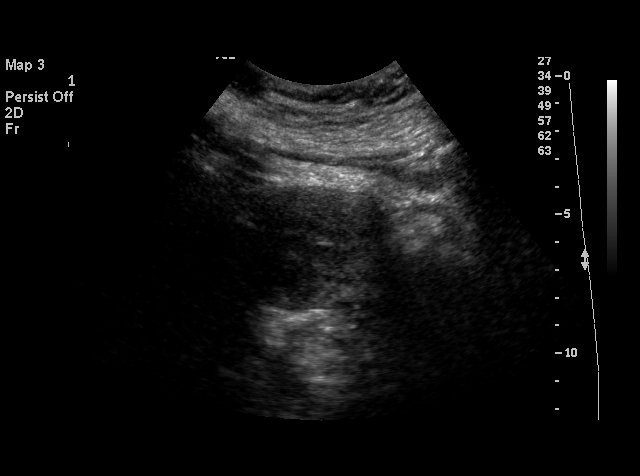
[im 17/36]
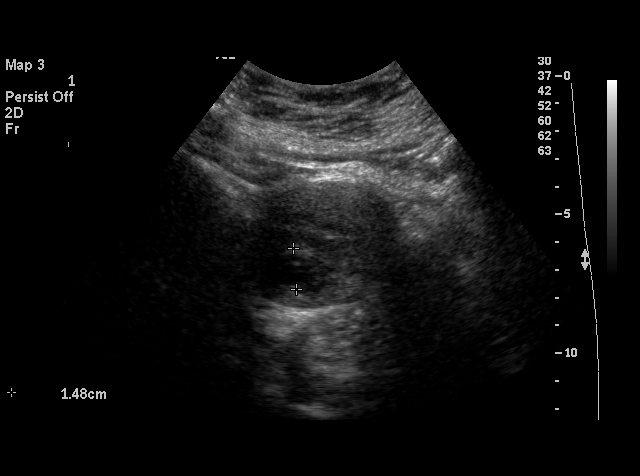
[im 19/36]
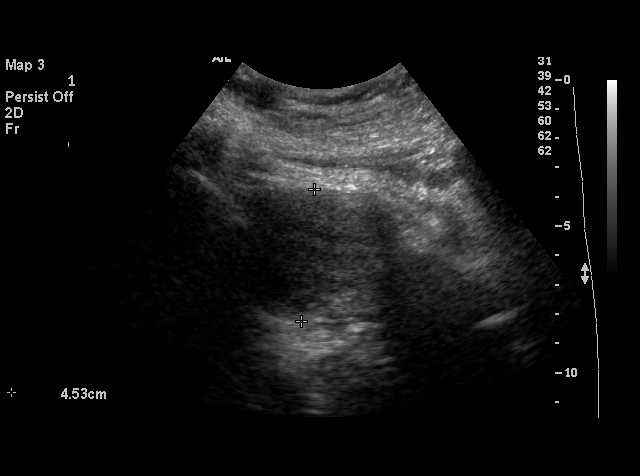
[im 21/36]
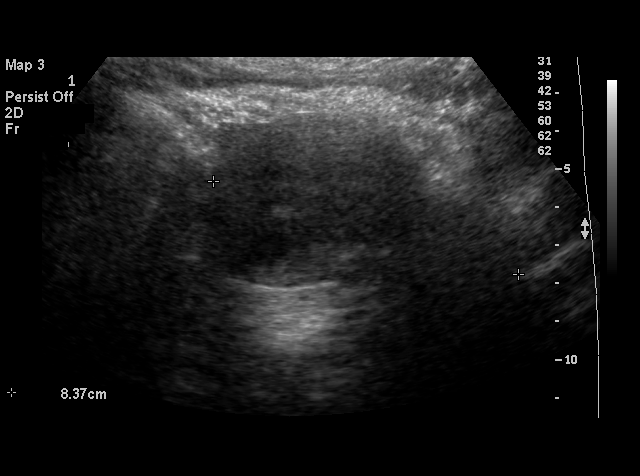
[im 22/36]
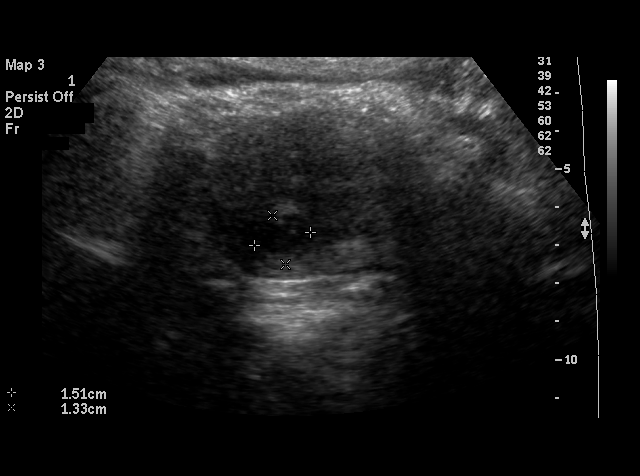
[im 25/36]
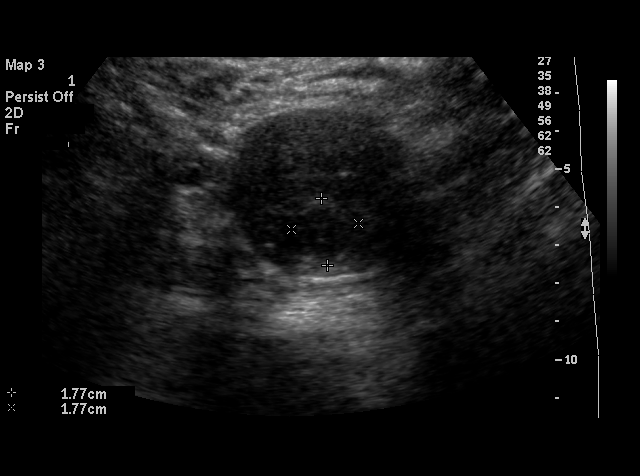
[im 27/36]
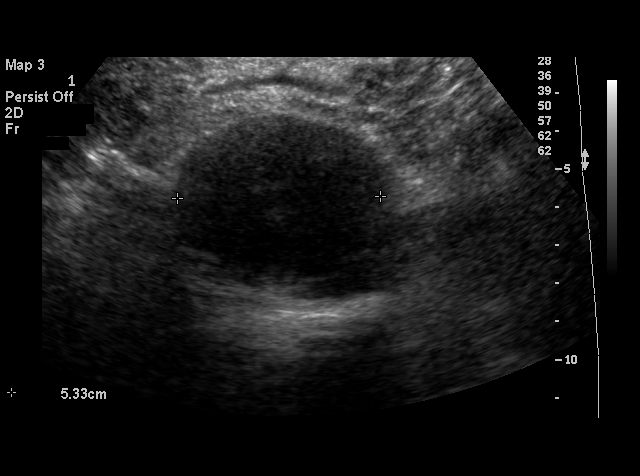
[im 30/36]
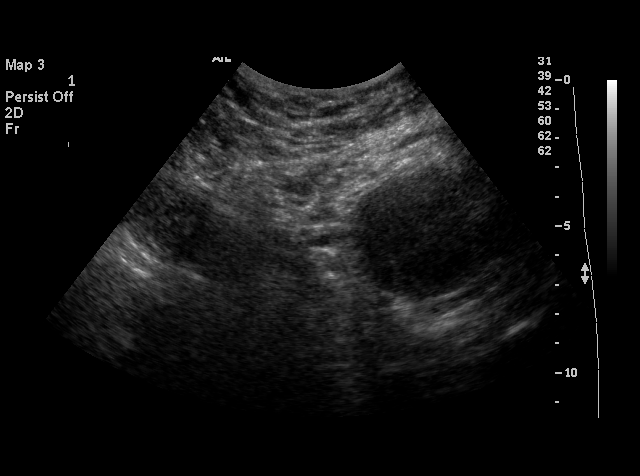
[im 31/36]
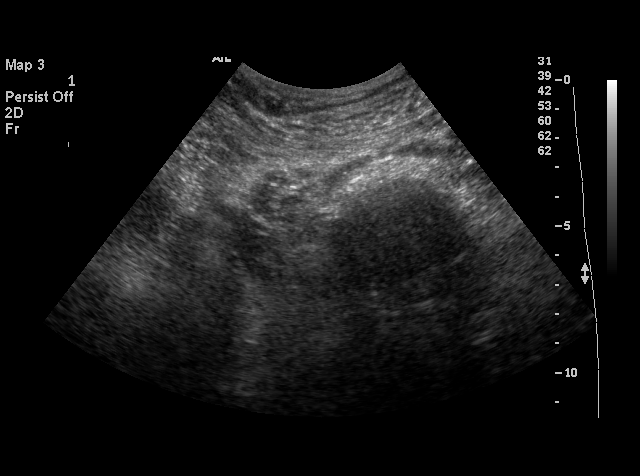
[im 33/36]
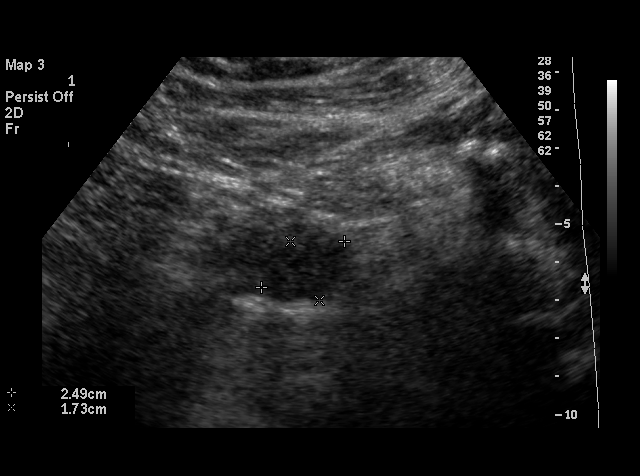
[im 36/36]
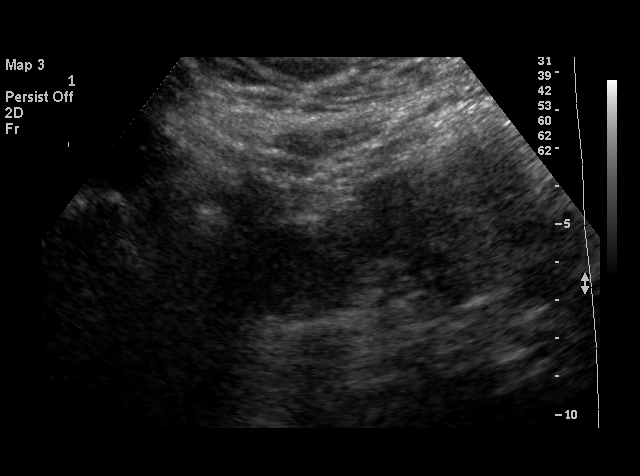

[18 of 25 positions shown; findings below may reference images not displayed]

FINDINGS: The uterus measures approximately 8 X 5 X 5 cm.  A fibroid is seen in the posterior upper uterine body on today?s study, which measures 1.3 X 1.5 cm.  The uterus is better visualized on transabdominal sonography due to its lie.  No other fibroids are seen.  Endometrial thickness measures approximately 4 mm.  
The right ovary is also visualized only on transabdominal sonography and is normal in appearance.  The left ovary is visualized on transvaginal sonography.  There is a simple cyst seen along the margin of the left ovary, which measures 2.8 X 1.5 X 1.8 cm.  This has benign sonographic features and may represent an ovarian or paraovarian cyst or possibly a mild hydrosalpinx.  This is not significantly changed compared with the prior study. 
No other adnexal masses are identified and there is no evidence of free fluid.
IMPRESSION: 1.  1.5 cm fibroid in the posterior upper uterine body. 
2.  Stable ovoid simple cyst in the left adnexa contiguous with the ovary.  This has benign sonographic features and may represent an ovarian or paraovarian cyst versus a mild hydrosalpinx.  ollow-up ultrasound is recommended in 6 months to confirm stability.

## 2007-11-19 IMAGING — MG MM DIGITAL SCREENING BILAT
4 series · 4 of 4 positions shown · non-contrast
Comparison: none

DG SCREEN MAMMOGRAM BILATERAL
Bilateral CC and MLO view(s) were taken.
Prior study comparison: April 05, 2003, bilateral screening mammogram, performed at [facility] [REDACTED].

DIGITAL SCREENING MAMMOGRAM WITH CAD:
The breast tissue is almost entirely fatty.  There is no dominant mass, architectural distortion or
calcification to suggest malignancy.

[R CC]
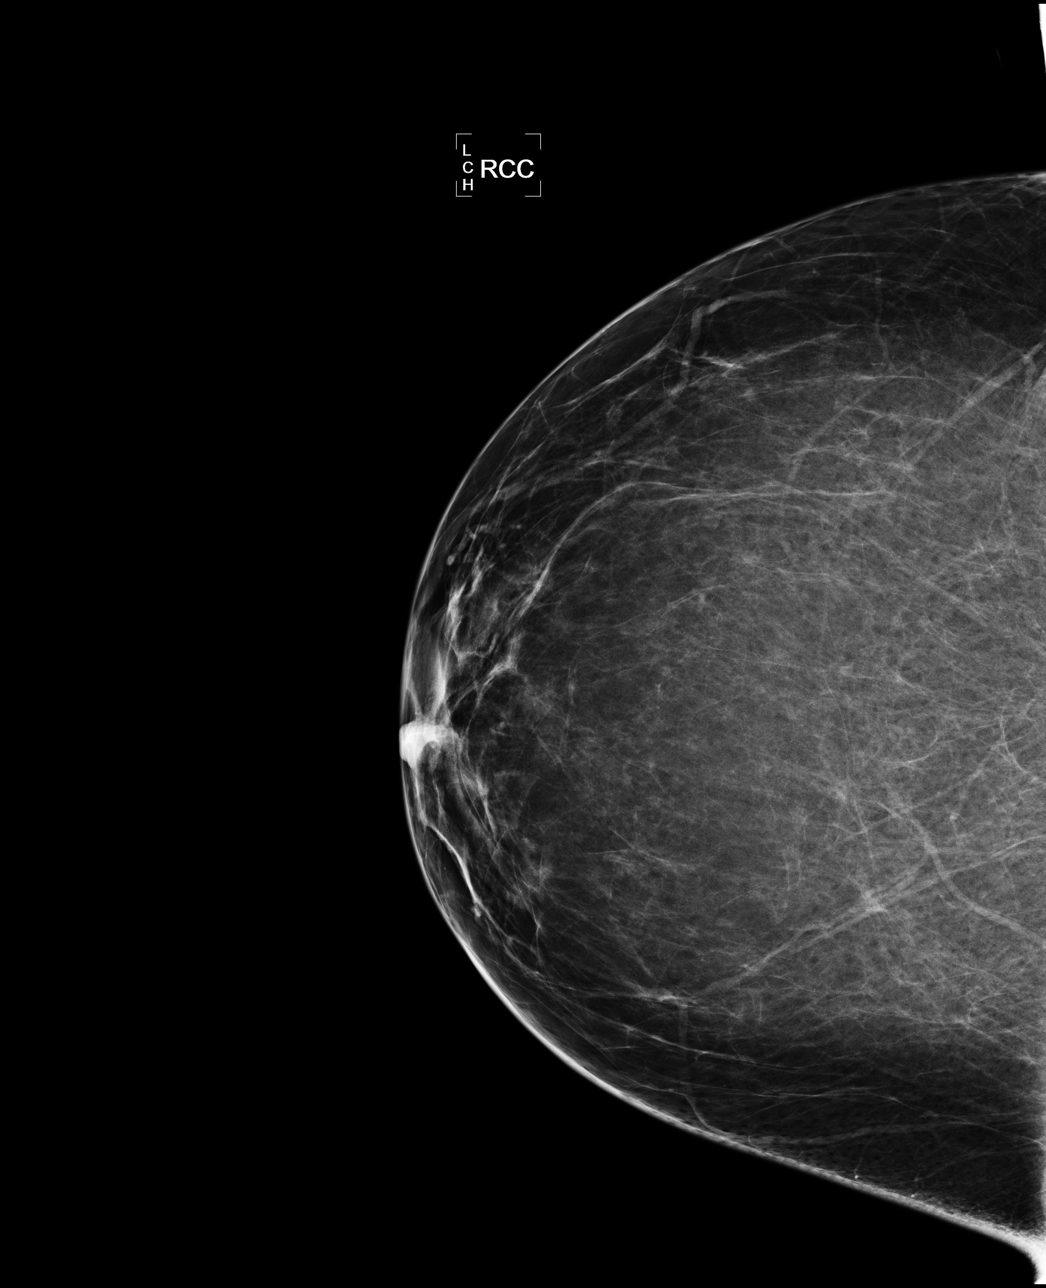

[R MLO]
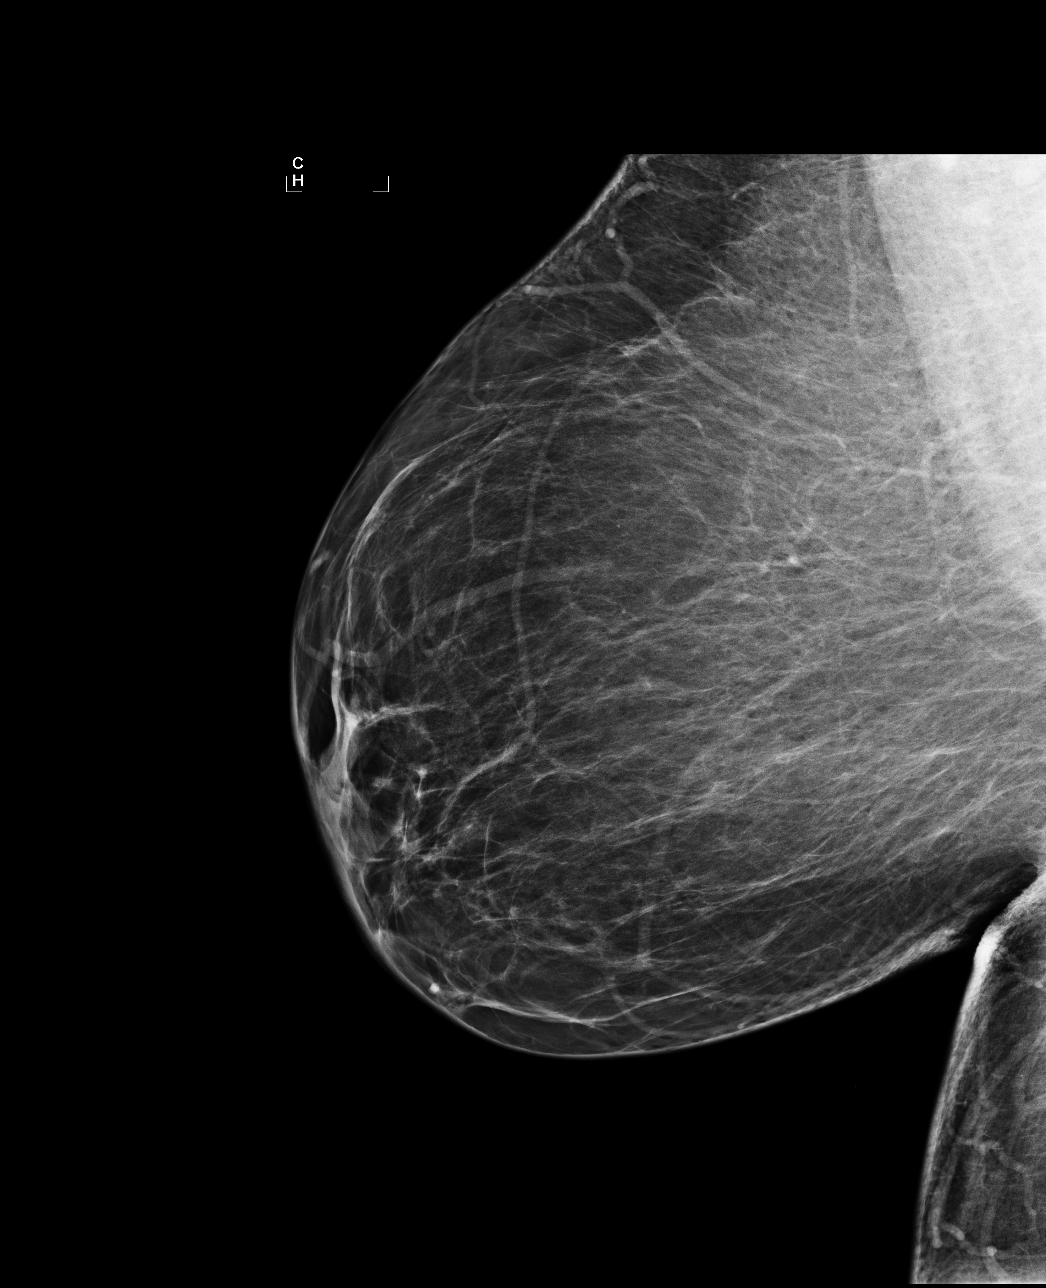

[L CC]
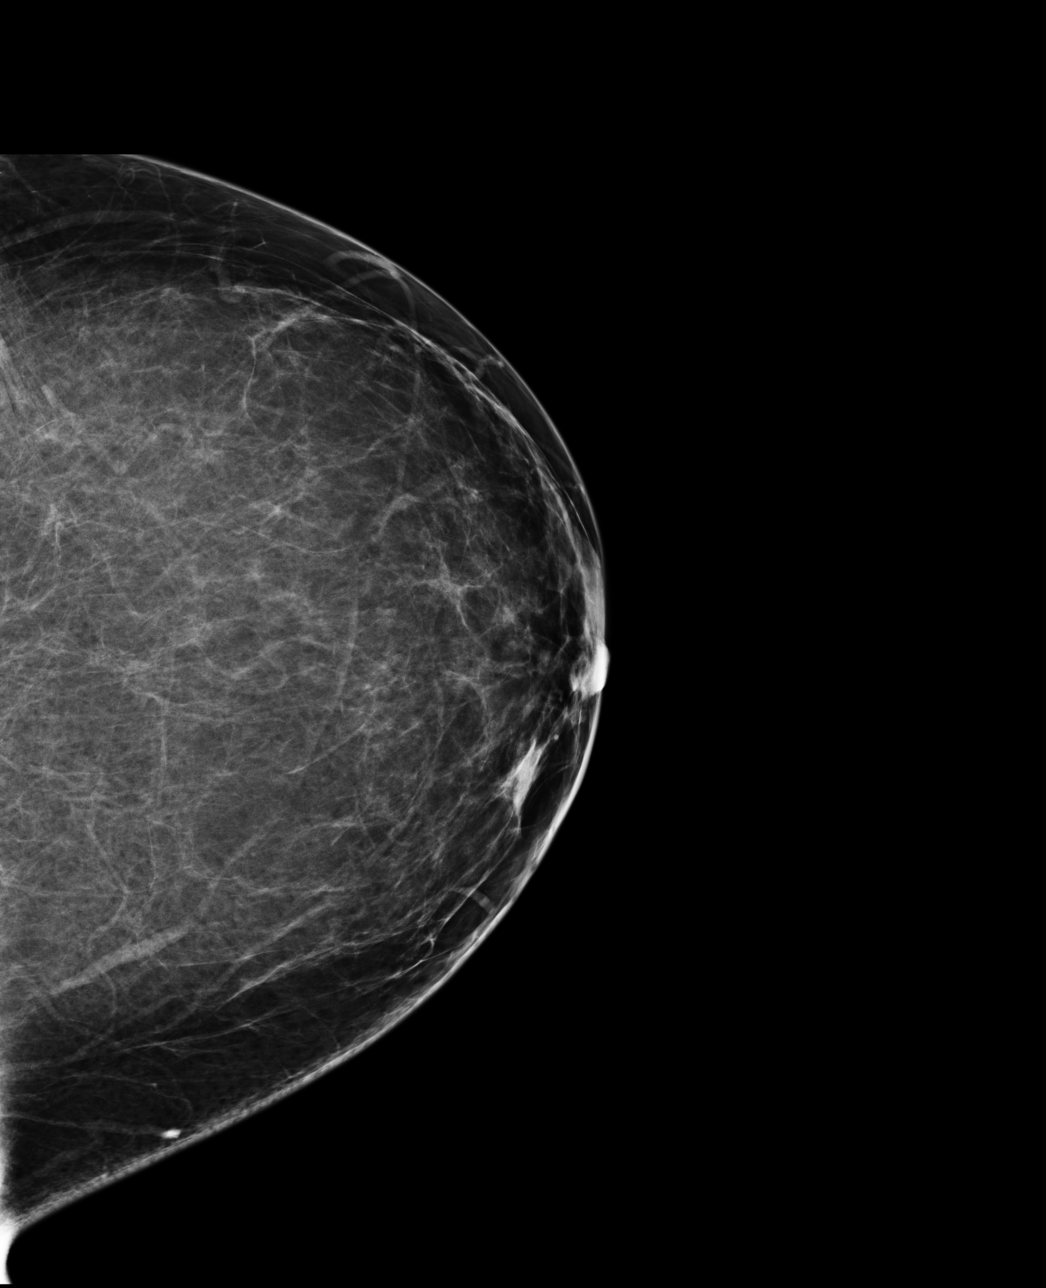

[L MLO]
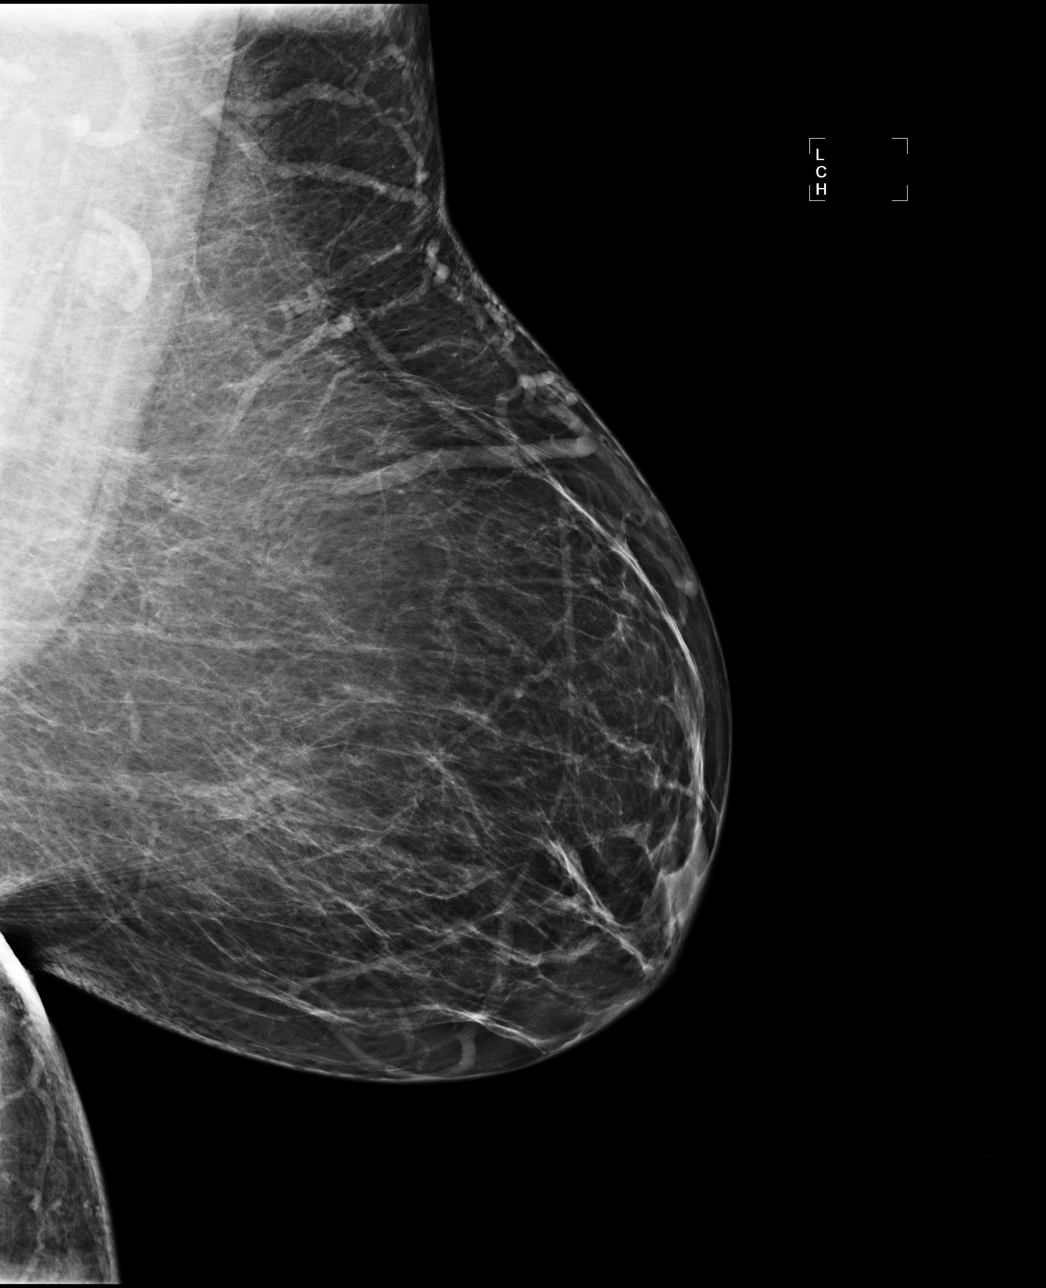

[4 of 4 positions shown; findings below may reference images not displayed]

IMPRESSION: No mammographic evidence of malignancy.  Suggest yearly screening mammography.

ASSESSMENT: Negative - BI-RADS 1

Screening mammogram in 1 year.
ANALYZED BY COMPUTER AIDED DETECTION. , THIS PROCEDURE WAS A DIGITAL MAMMOGRAM.

## 2007-11-27 IMAGING — CT CT ABDOMEN W/ CM
2 of 6 series · 17 of 46 positions shown, 19 images · IV contrast (omnipaque)
Comparison: 05/25/04.

CLINICAL DATA: Abdominal pain/left lower quadrant pain.  Nausea for 2 months.  History of colon cancer with previous colon resection.  
 ABDOMEN CT WITH CONTRAST:
TECHNIQUE: Multidetector CT imaging of the abdomen was performed following the standard protocol during bolus administration of intravenous contrast.
 Contrast:  125 cc Omnipaque 300
TECHNIQUE: Multidetector CT imaging of the pelvis was performed following the standard protocol during bolus administration of intravenous contrast.

[Series 2: abd_pel 5.0 b40s · axial · 0.74mm/px · z∈[-448,-88]mm · 14 of 84 slices shown, 16 images]
[im 6/84  soft-tissue]
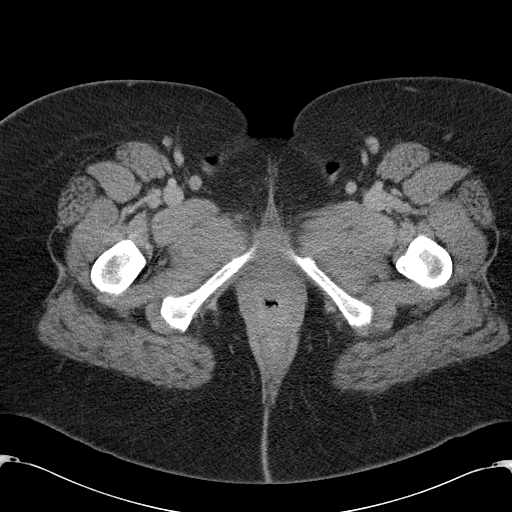
[im 6/84  bone]
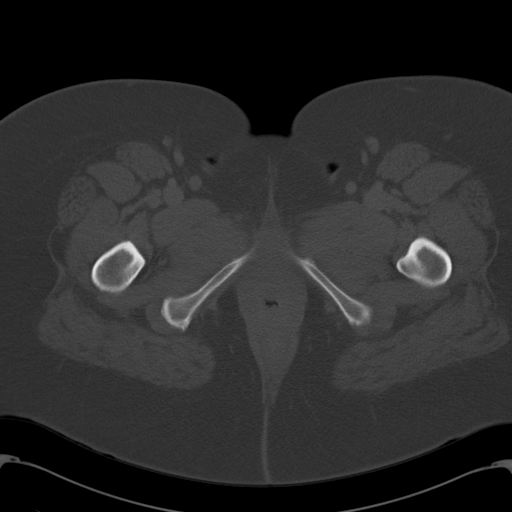
[im 11/84  soft-tissue]
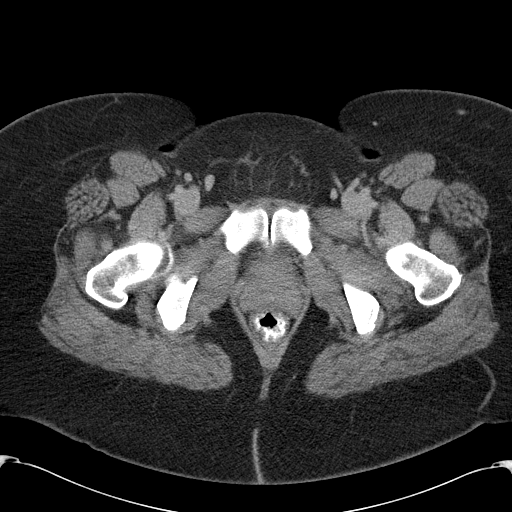
[im 16/84  soft-tissue]
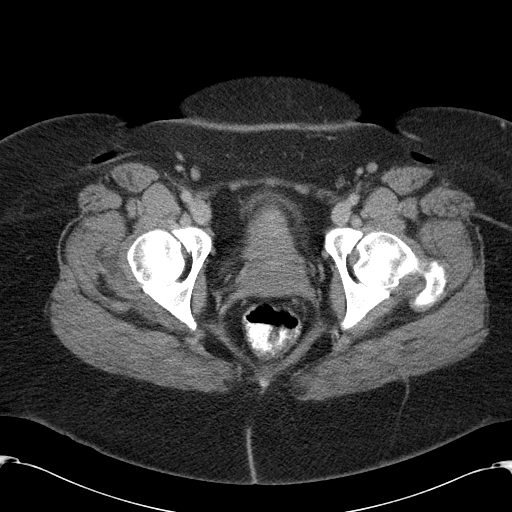
[im 21/84  soft-tissue]
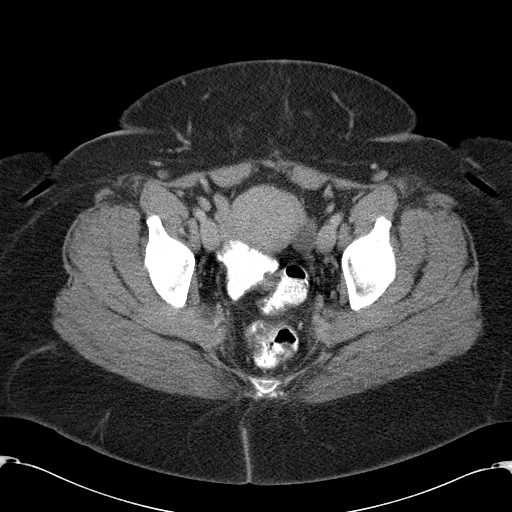
[im 26/84  soft-tissue]
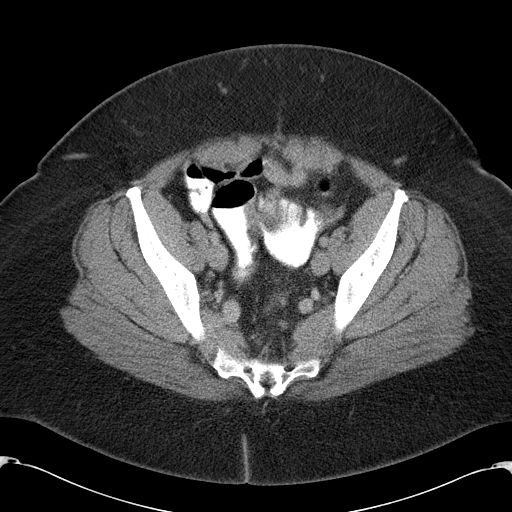
[im 32/84  soft-tissue]
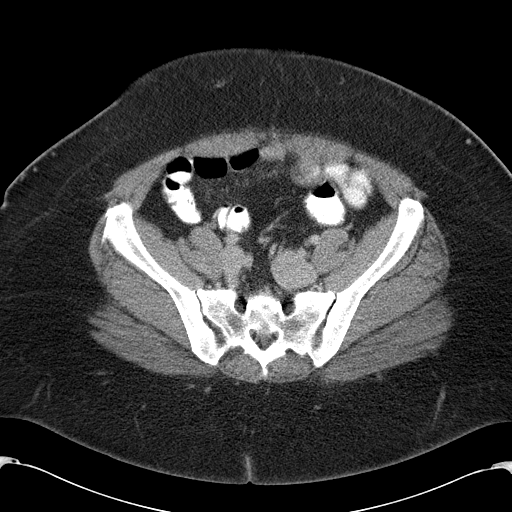
[im 37/84  soft-tissue]
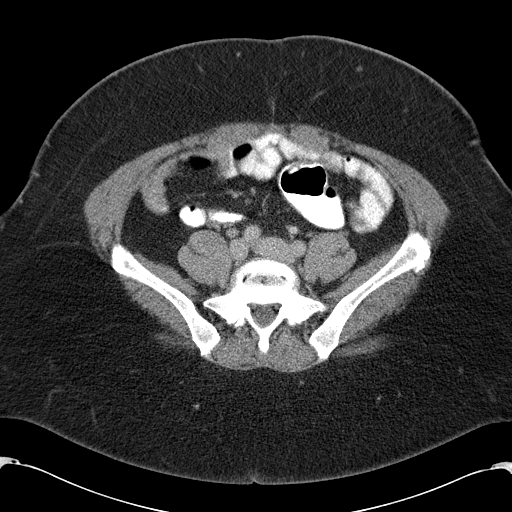
[im 47/84  soft-tissue]
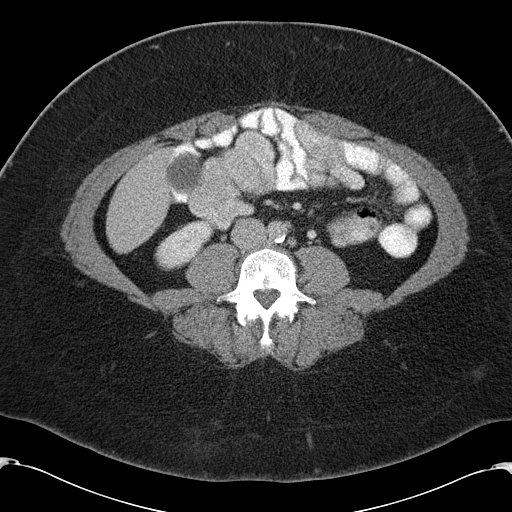
[im 52/84  soft-tissue]
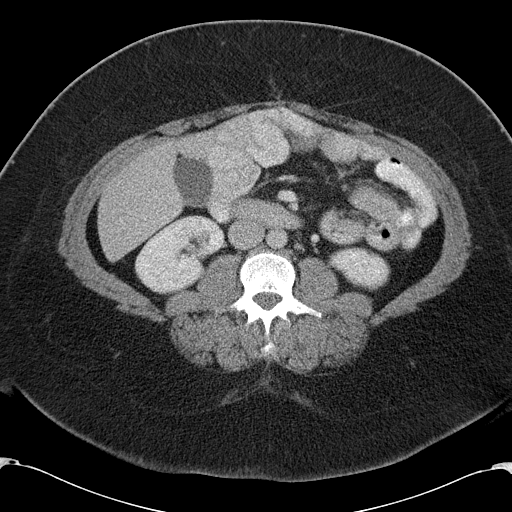
[im 52/84  bone]
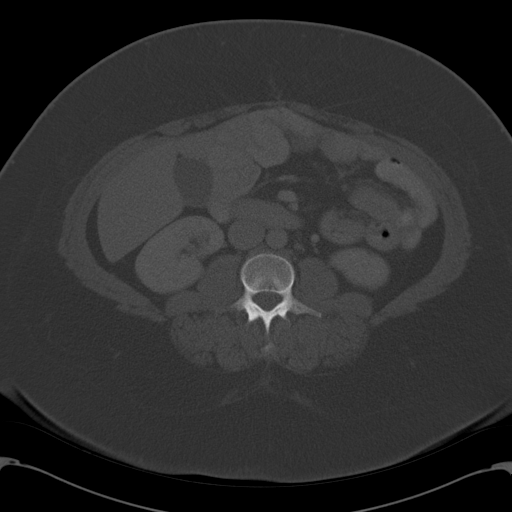
[im 58/84  soft-tissue]
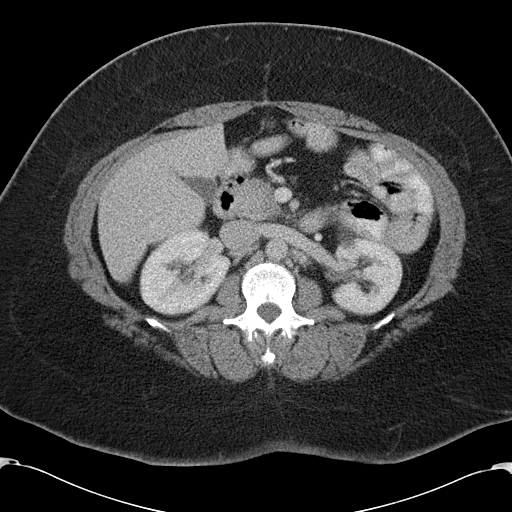
[im 63/84  soft-tissue]
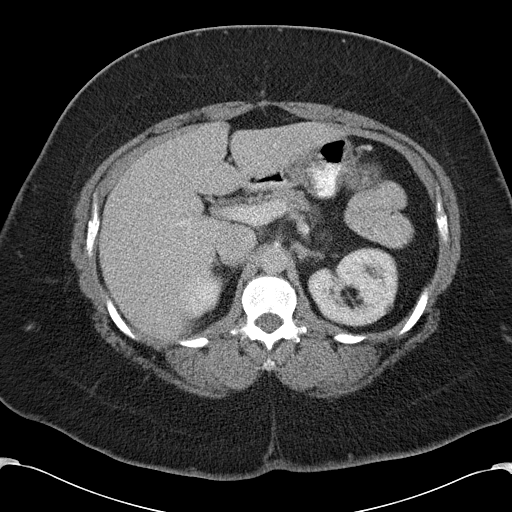
[im 68/84  soft-tissue]
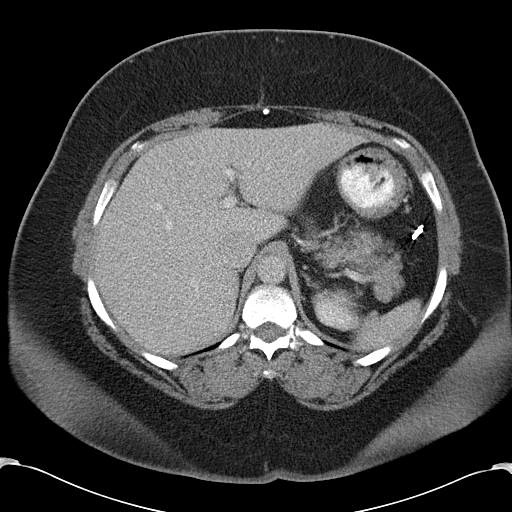
[im 73/84  soft-tissue]
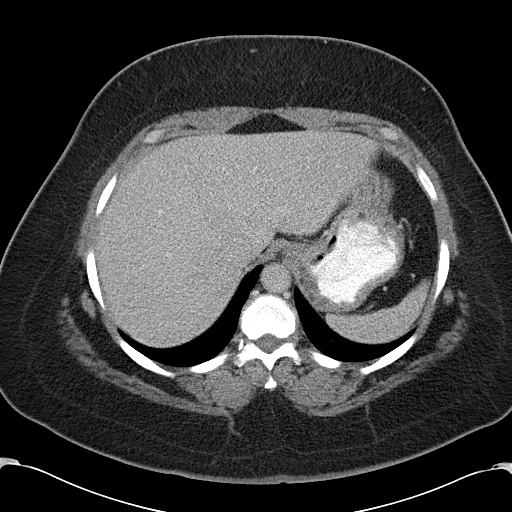
[im 78/84  soft-tissue]
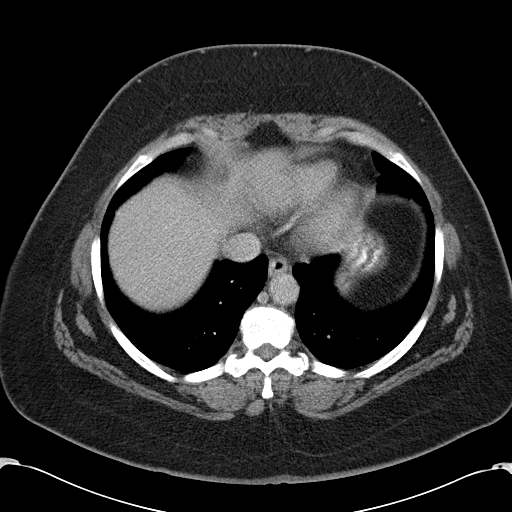

[Series 4: coronal mpr · coronal · 0.88mm/px · 3 of 55 slices shown]
[im 19/55  soft-tissue]
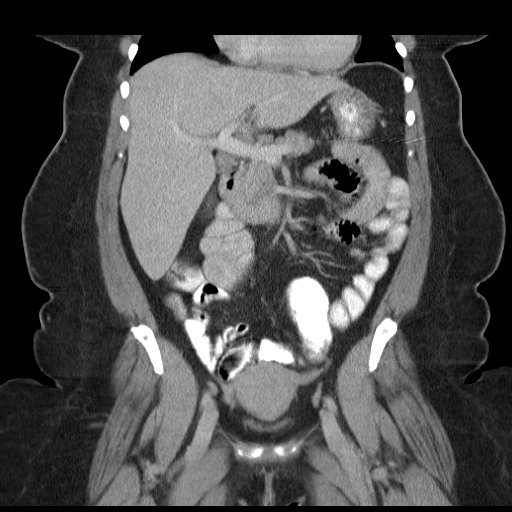
[im 25/55  soft-tissue]
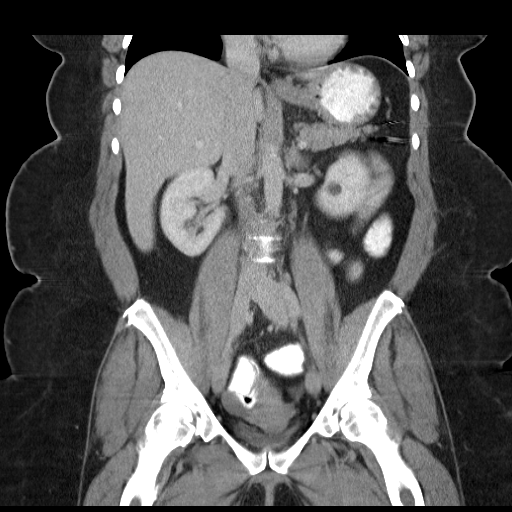
[im 31/55  soft-tissue]
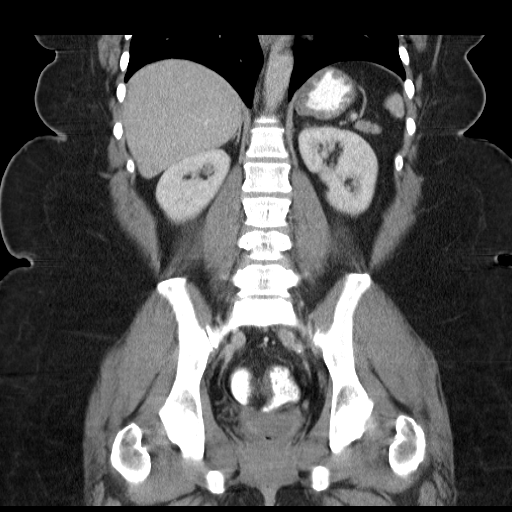

[17 of 46 positions shown; findings below may reference images not displayed]

FINDINGS: The liver, spleen, pancreas, and adrenal glands have a normal appearance.  There is a 2.8 cm in size simple appearing anteriorly located midleft renal cyst (image #28).  Kidneys otherwise have a normal appearance.  Scans of the lung bases are negative.  There is no retroperitoneal or mesenteric adenopathy.  There has been a previous partial colectomy.  The gallbladder has a normal appearance.
IMPRESSION: Previous partial colectomy.  Simple appearing left renal cyst.  No findings to account for the patient?s abdominal pain. 
 PELVIS CT WITH CONTRAST:
FINDINGS: There has been a previous partial colectomy with an ileocolic anastomosis within the left lower quadrant with the patient?s remaining sigmoid colon.  There is no evidence for diverticulitis or an abscess and there is no pelvic mass or adenopathy.  The urinary bladder is decompressed.
IMPRESSION: Previous partial colectomy.  No findings to account for the patient?s abdominal pain.

## 2008-01-16 IMAGING — CR DG CHEST 2V
2 series · 2 of 2 positions shown · non-contrast
Comparison: 10/18/05.

CLINICAL DATA: Cough.  Asthma.  Smoking history.  Hypertension.  
 CHEST - 2 VIEW:

[w chest pa *]
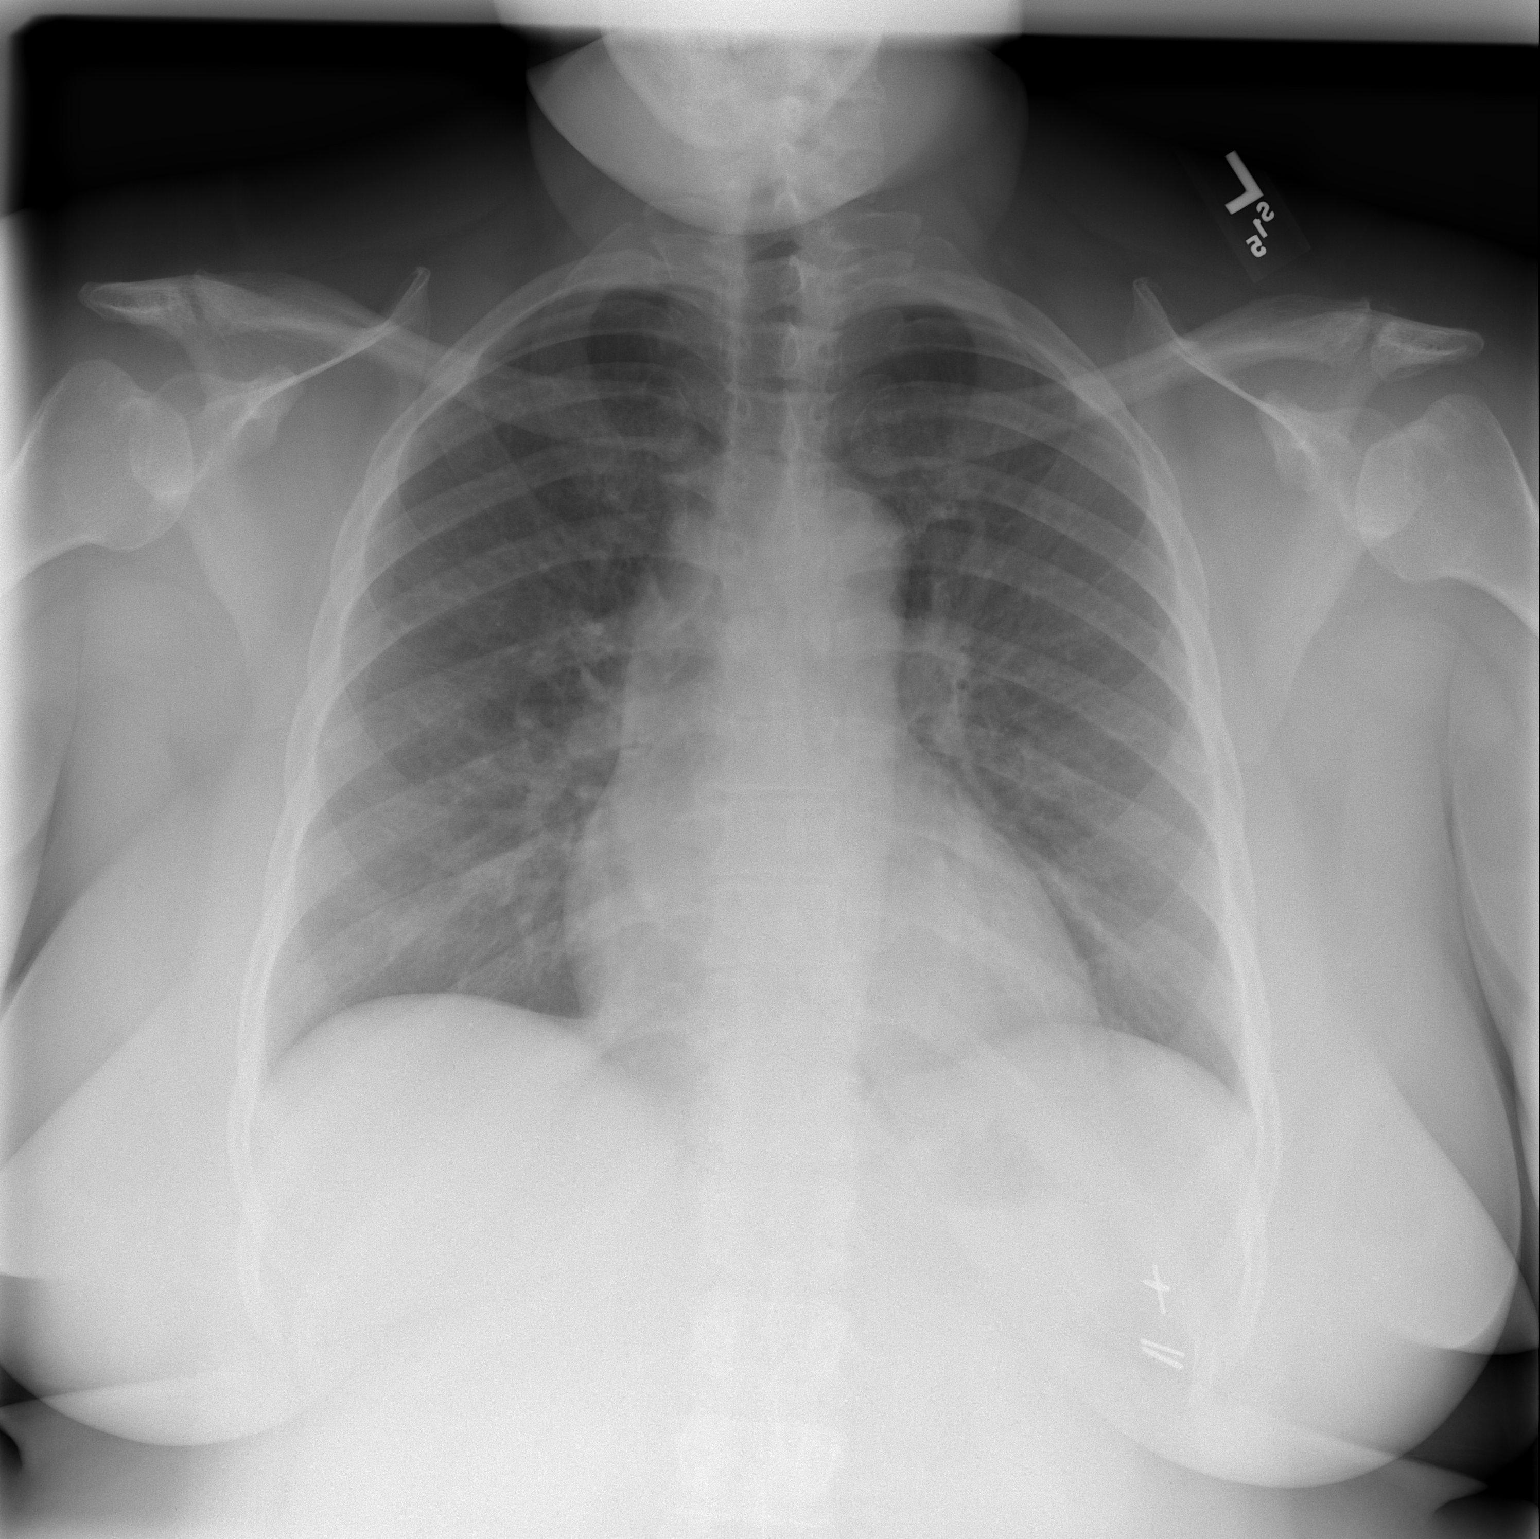

[w chest lat *]
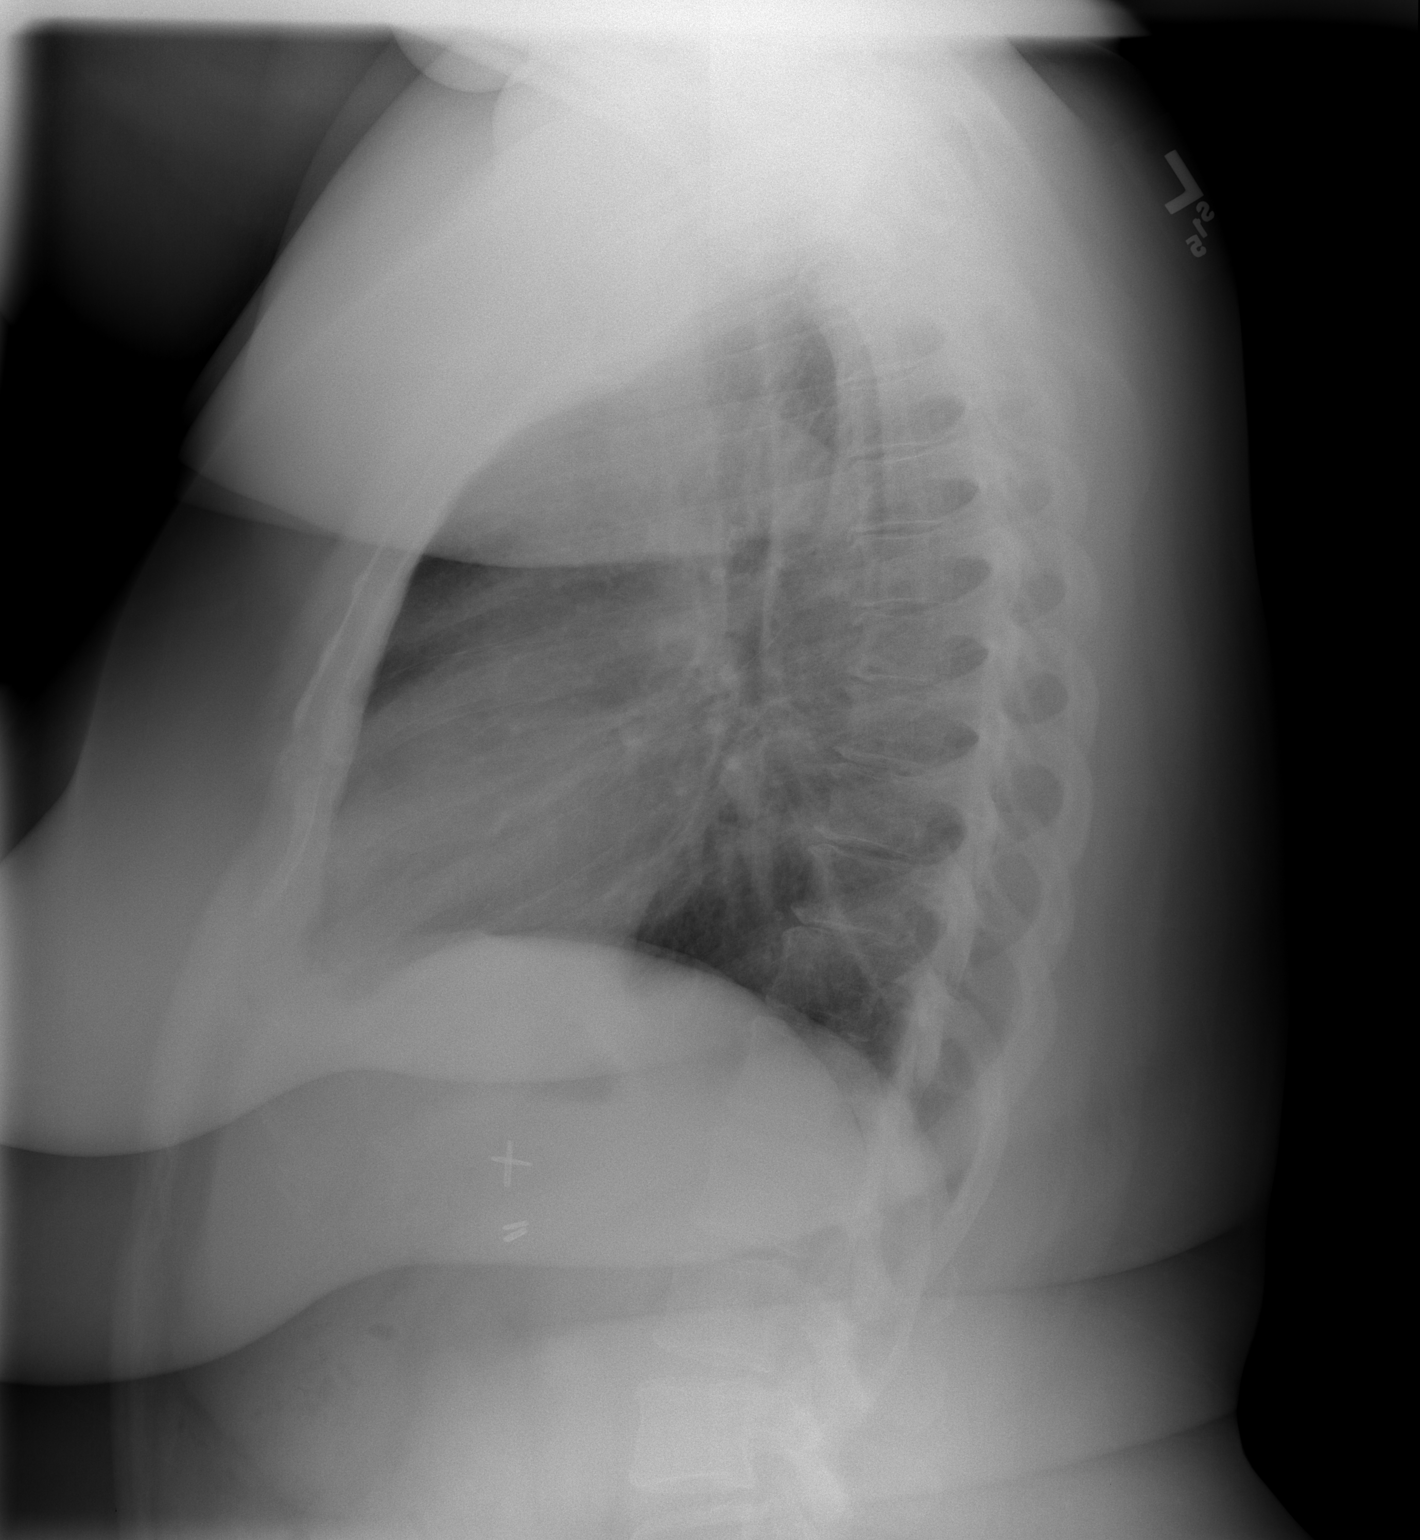

[2 of 2 positions shown; findings below may reference images not displayed]

FINDINGS: Two views of the chest show no active infiltrate or effusion. There is some peribronchial thickening which may indicate bronchitis.  The heart is within normal limits in size.  No bony abnormality is noted.
IMPRESSION: No active lung disease.  Question bronchitis.

## 2008-02-22 ENCOUNTER — Emergency Department (HOSPITAL_COMMUNITY): Admission: EM | Admit: 2008-02-22 | Discharge: 2008-02-22 | Payer: Self-pay | Admitting: Emergency Medicine

## 2008-03-30 IMAGING — MG MM DIAGNOSTIC UNILATERAL L
5 series · 5 of 5 positions shown · non-contrast
Comparison: none

DG DIAGNOSTIC UNILATERAL L
CC and MLO view(s) were taken of the left breast.

LEFT BREAST ULTRASOUND
Technologist: Hyeogcheol Hueppi, Medical
DIGITAL UNILATERAL LEFT MAMMOGRAM WITH CAD AND LEFT BREAST ULTRASOUND:
CLINICAL DATA: Left breast pain and palpable area far laterally.

[L CC]
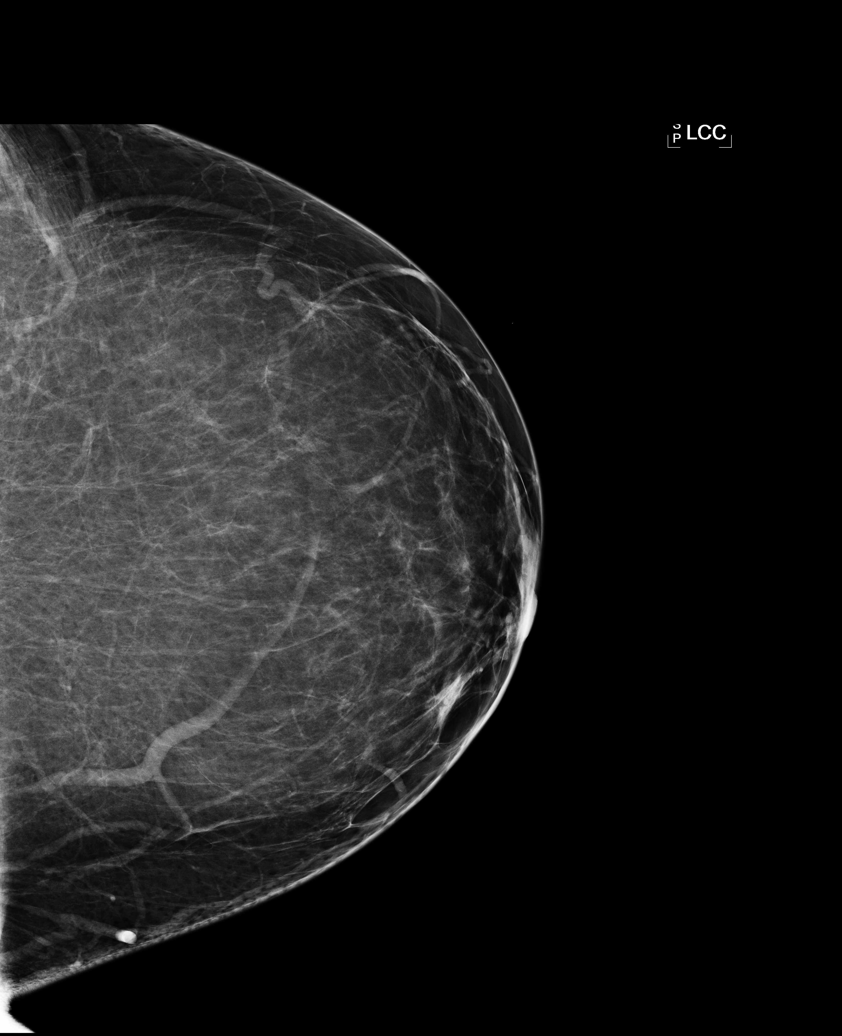

[L MLO (1 of 3)]
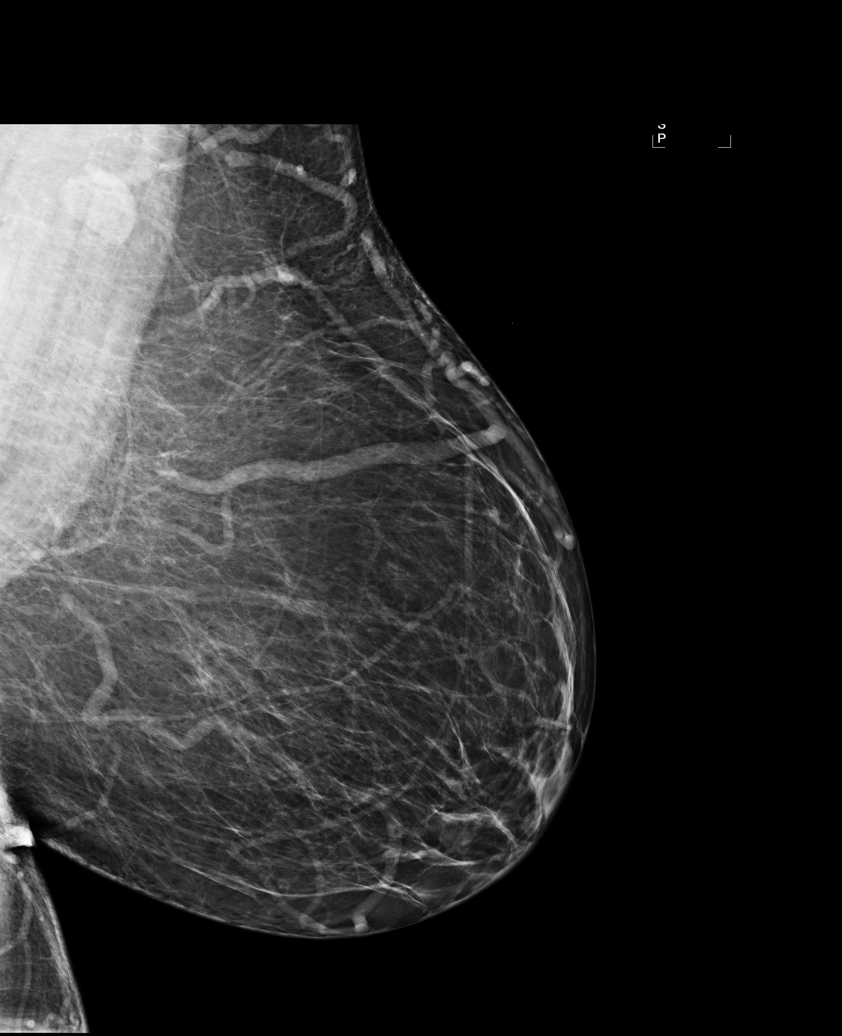

[L MLO (2 of 3)]
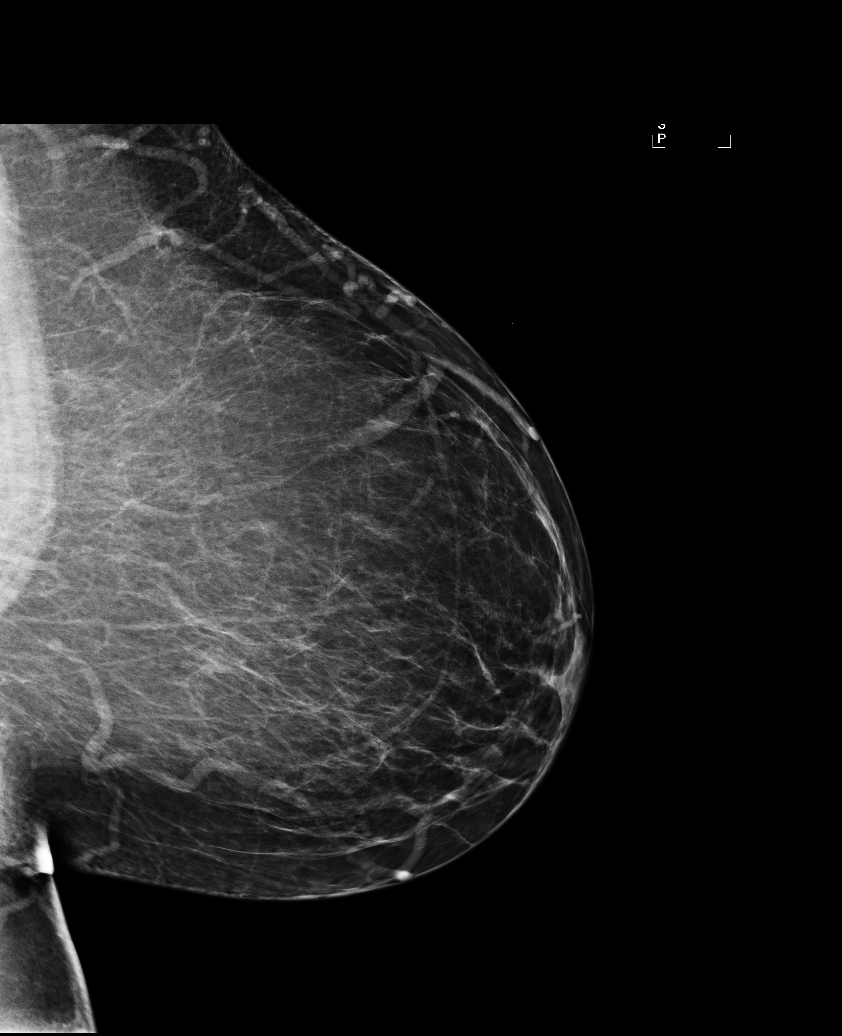

[L MLO (3 of 3)]
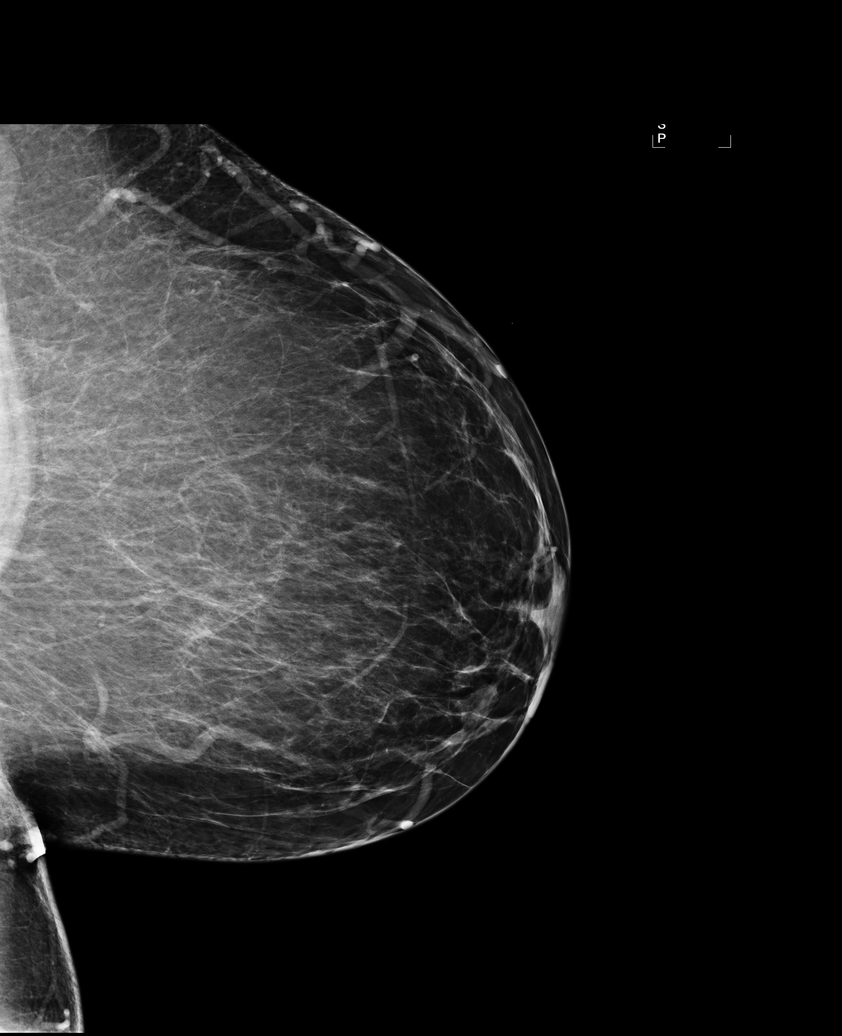

[L XCCL]
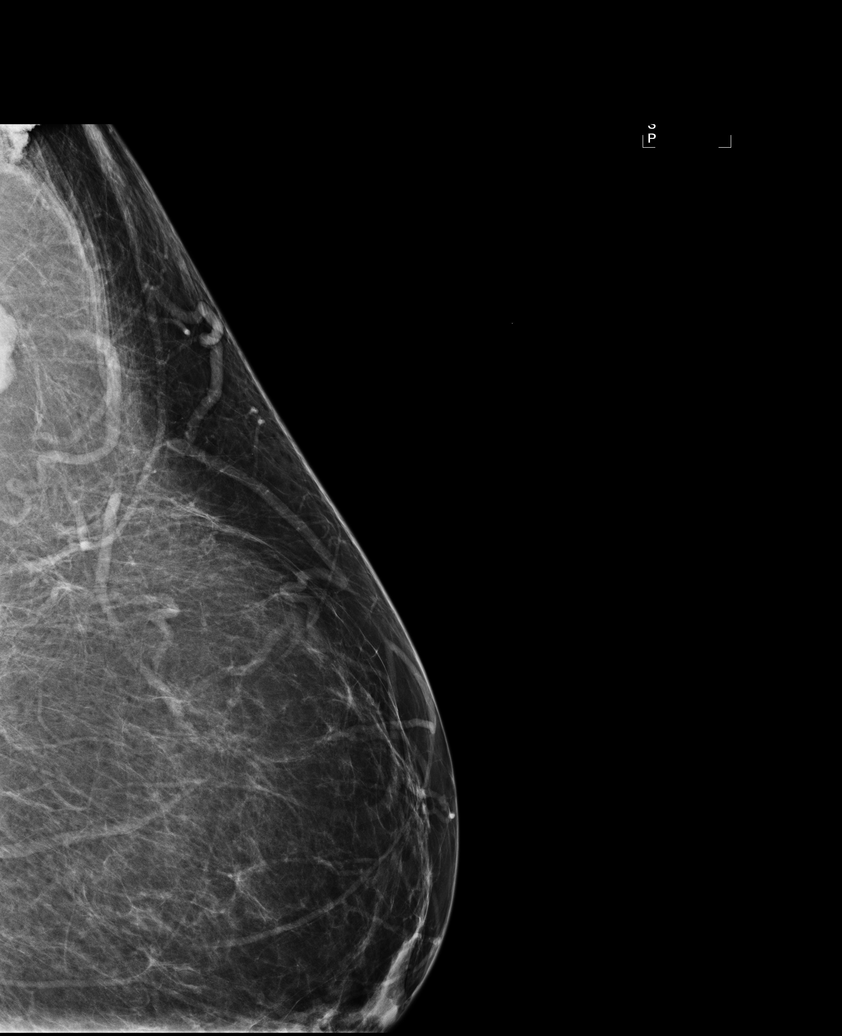

[5 of 5 positions shown; findings below may reference images not displayed]

Comparison studies are dated 04-05-03 and 02-27-06.

The fatty parenchymal pattern is stable on the left.  No dominant masses, suspicious 
calcifications, or areas of architectural distortion are seen within the left breast.

Physical exam reveals soft lumpy tissue along the mid axillary line far laterally on the left.  
Ultrasound of this region reveals normal fatty tissue only.
IMPRESSION: There is no specific radiographic evidence of malignancy on the left.  Screening mammogram in 
February 2007 is recommended.

ASSESSMENT: Negative - BI-RADS 1

Screening mammogram of both breasts in 8 months.
ANALYZED BY COMPUTER AIDED DETECTION. ,

## 2008-04-14 ENCOUNTER — Inpatient Hospital Stay (HOSPITAL_COMMUNITY): Admission: EM | Admit: 2008-04-14 | Discharge: 2008-04-17 | Payer: Self-pay | Admitting: Emergency Medicine

## 2008-04-15 IMAGING — CR DG CHEST 2V
2 series · 2 of 2 positions shown · non-contrast
Comparison: 05/07/2006

CLINICAL DATA: Chest pain, cough. 
 CHEST - 2 VIEW:

[view not recorded (1 of 2)]
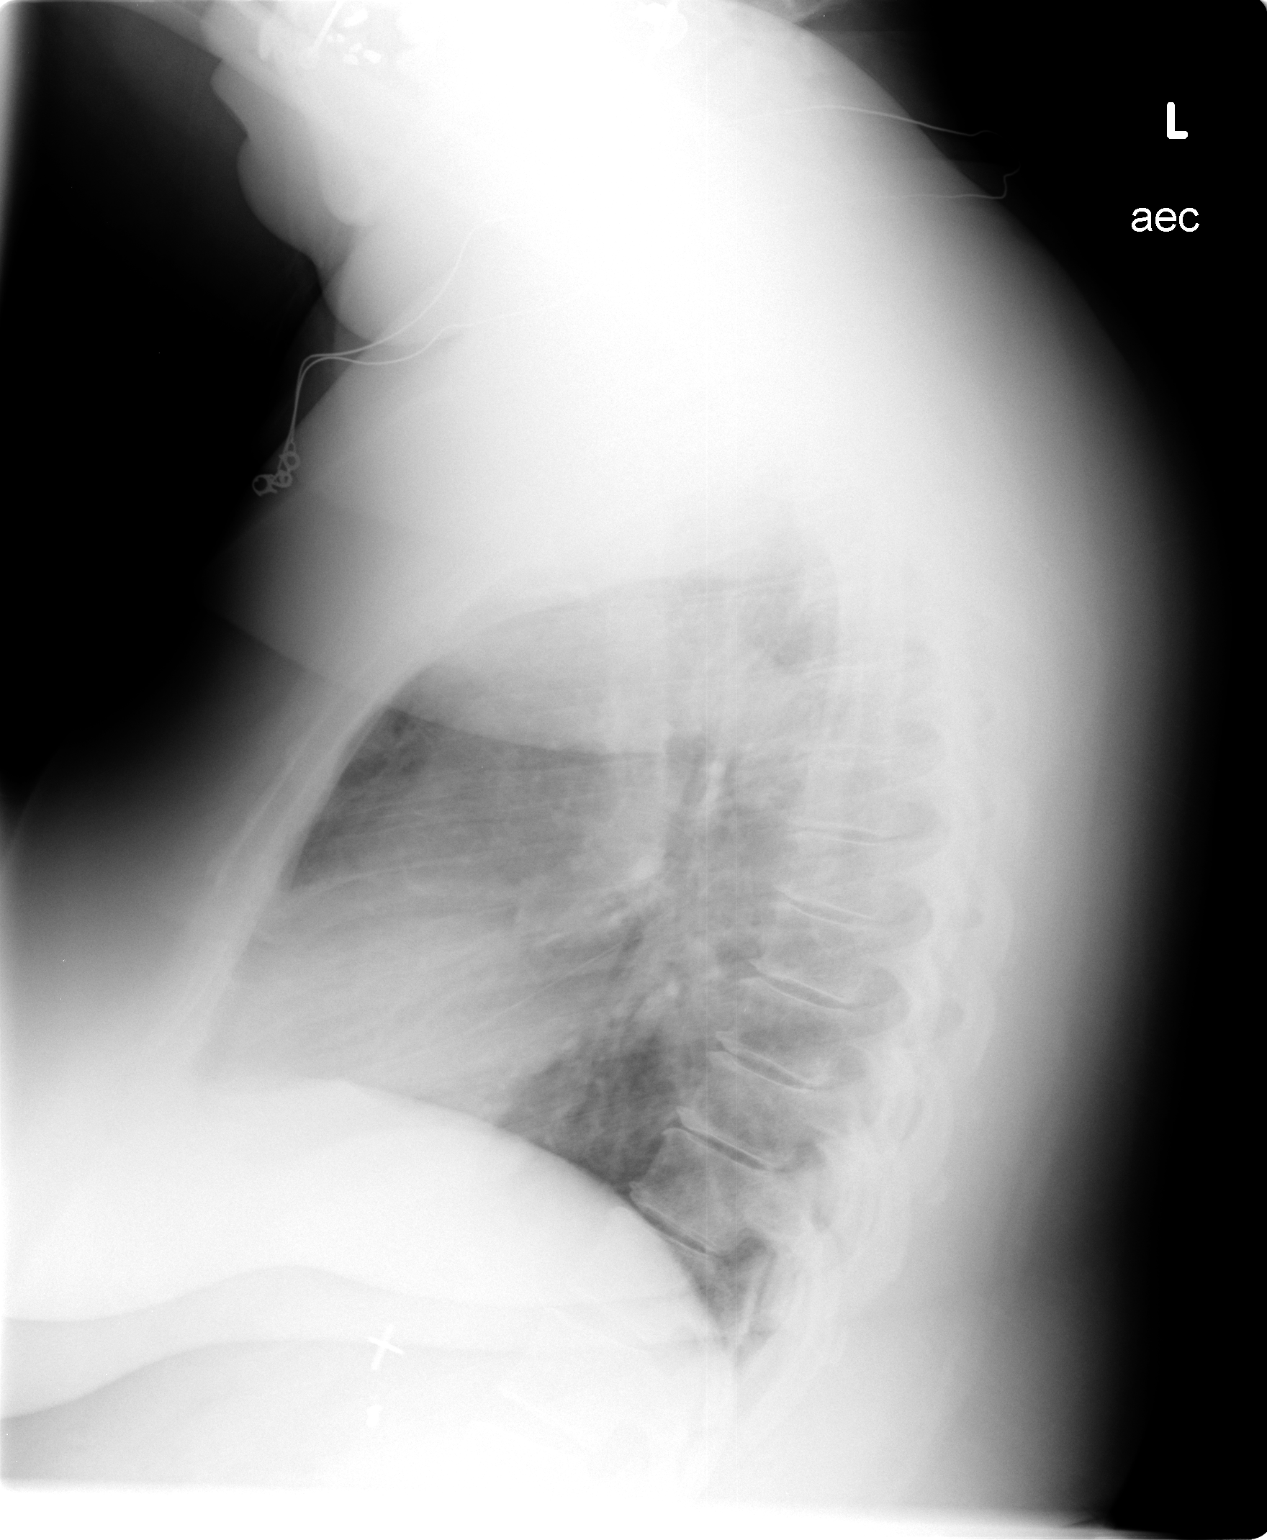

[view not recorded (2 of 2)]
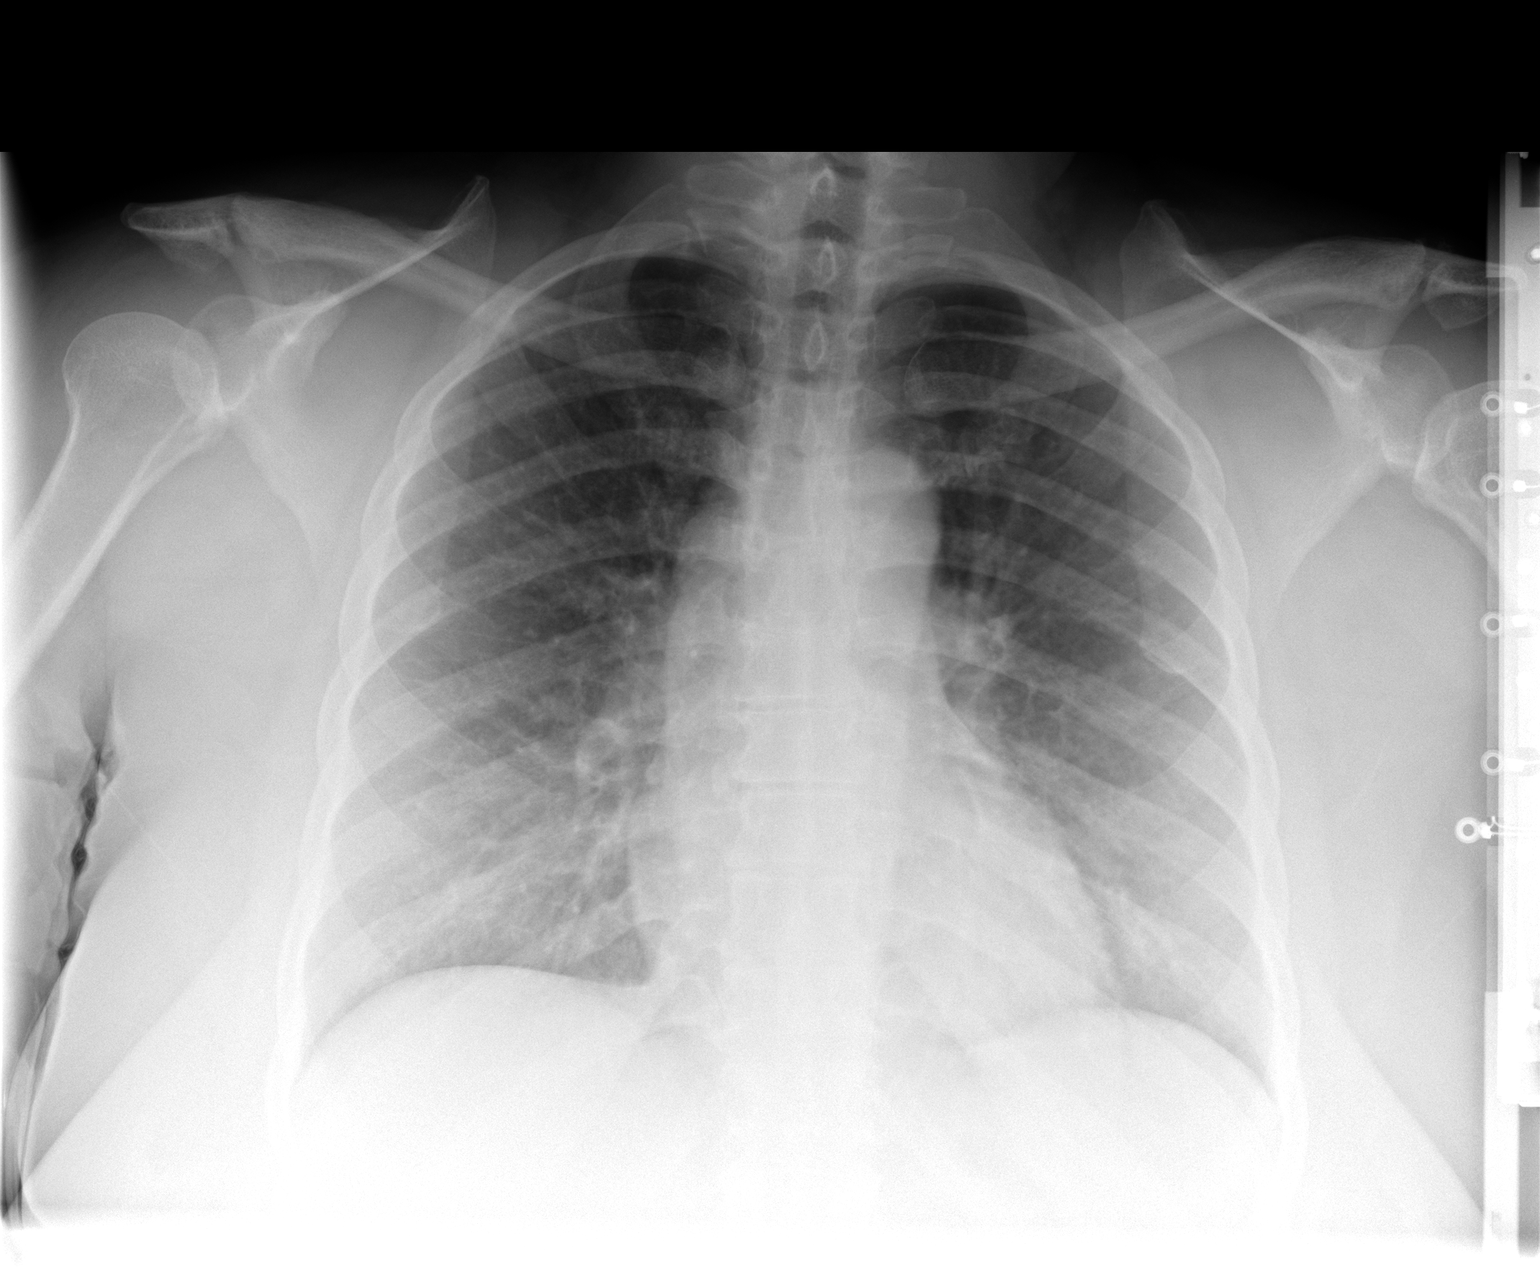

[2 of 2 positions shown; findings below may reference images not displayed]

FINDINGS: The cardiomediastinal silhouette is unremarkable.  No evidence of focal airspace disease, pleural effusions, or pneumothorax. The visualized bony thorax is within normal limits.
IMPRESSION: Mild peribronchial thickening without focal airspace disease.

## 2008-06-09 IMAGING — CR DG ABDOMEN ACUTE W/ 1V CHEST
4 series · 4 of 4 positions shown · non-contrast
Comparison: Previous examinations.

CLINICAL DATA: Mid and left abdominal pain. Diarrhea. Status post partial
colectomy in 9773 for colon cancer.

ABDOMEN SERIES - 2 VIEW & CHEST - 1 VIEW

[w chest pa]
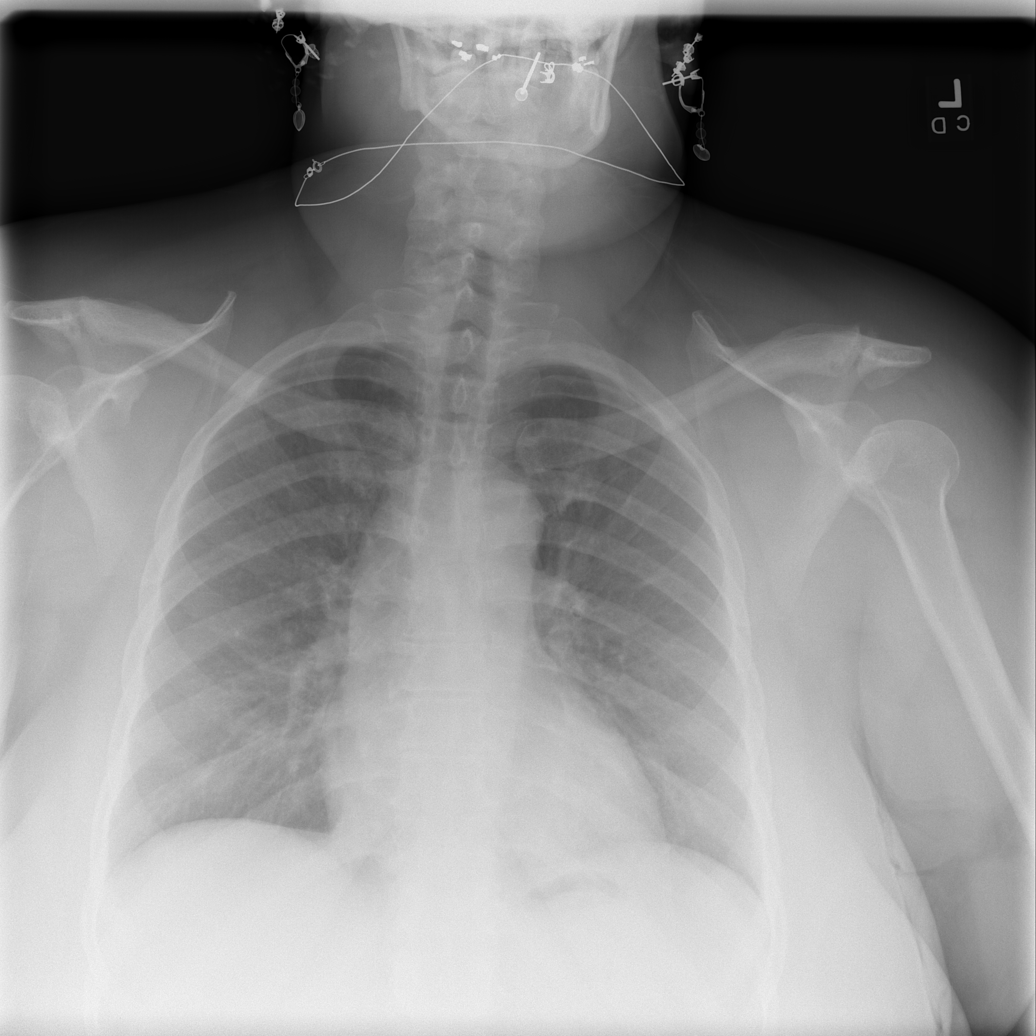

[w abdomen upright *]
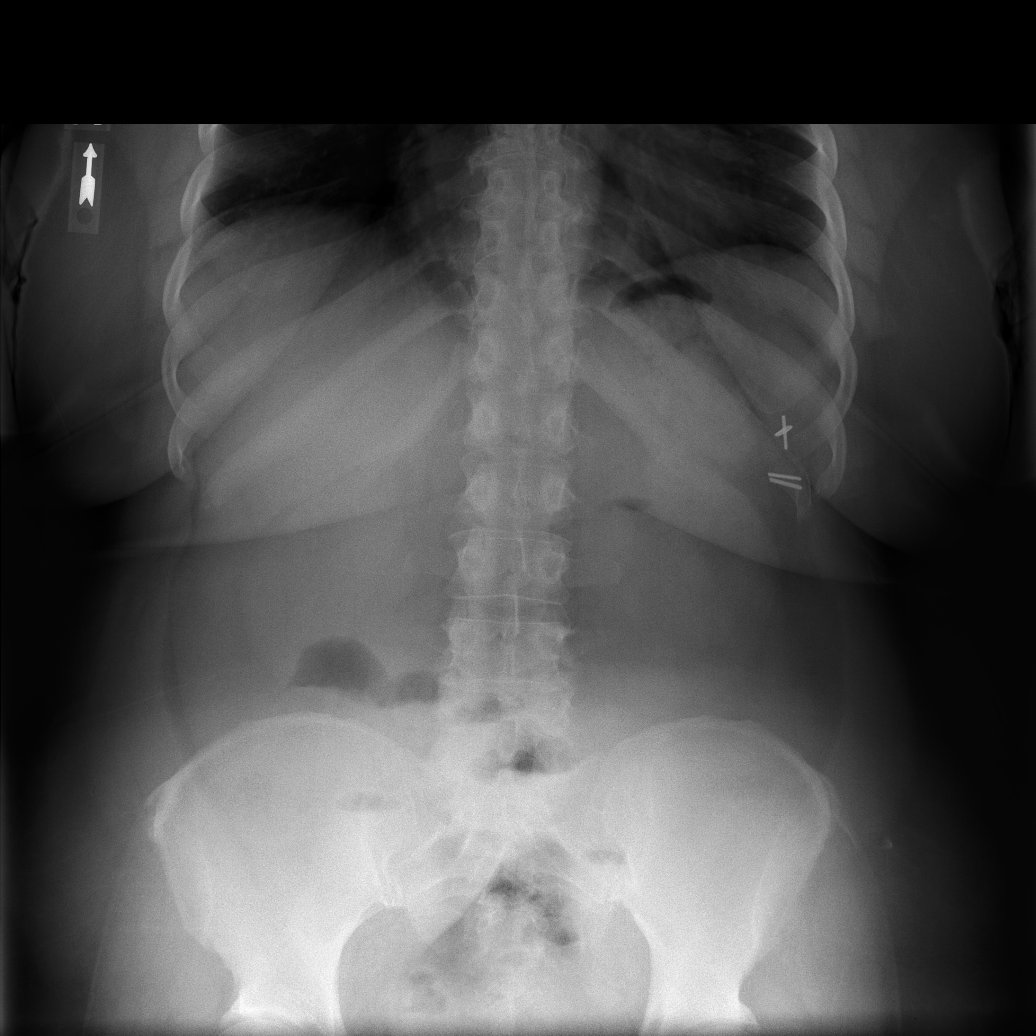

[t abdomen supine (1 of 2)]
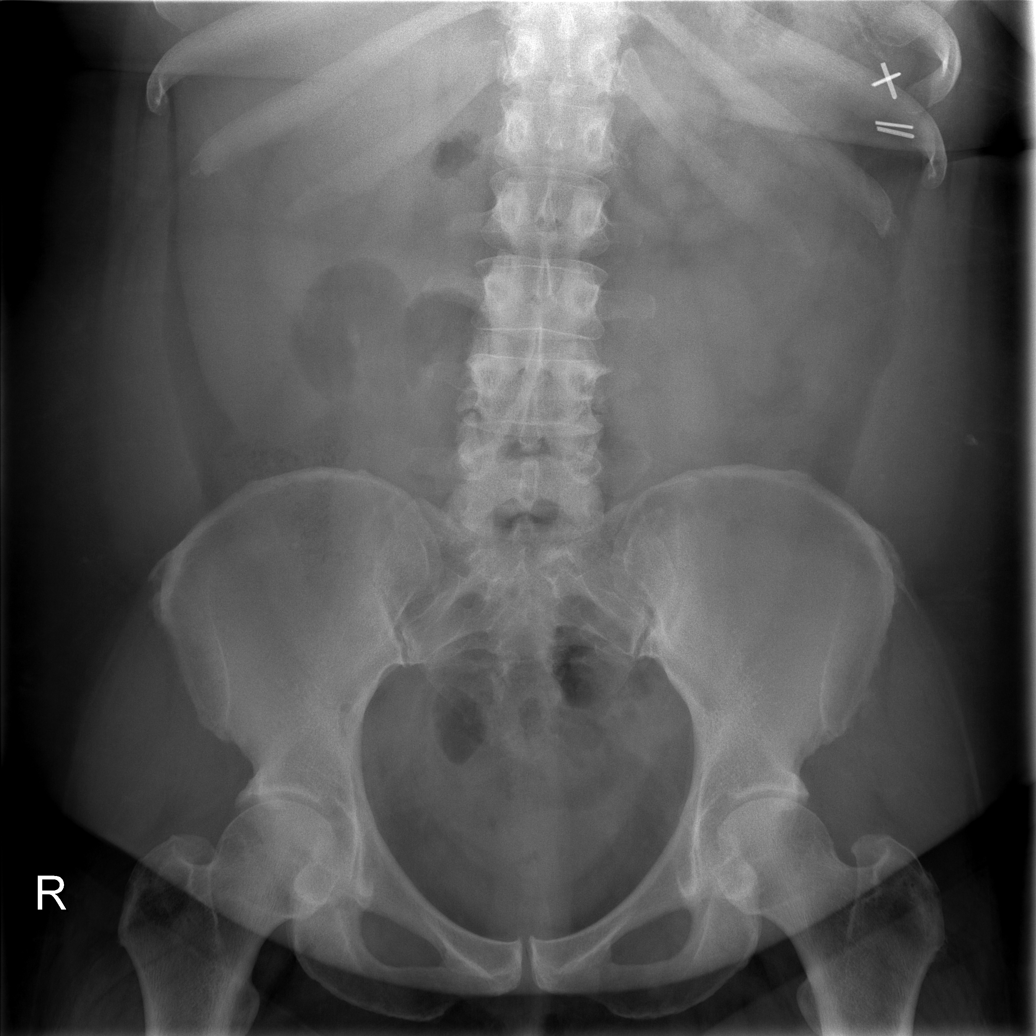

[t abdomen supine (2 of 2)]
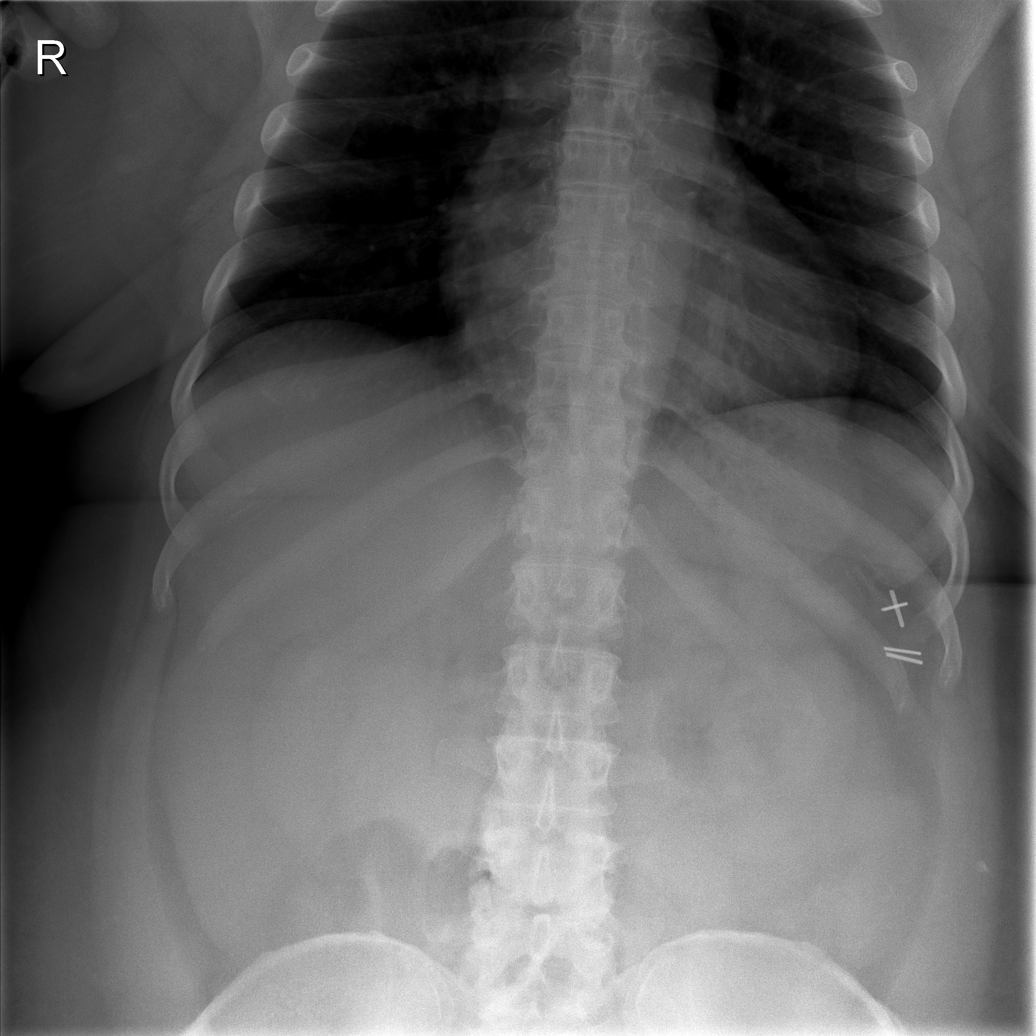

[4 of 4 positions shown; findings below may reference images not displayed]

FINDINGS: Stable normal sized heart. Mild central peribronchial thickening,
improved since 08/05/2006. Paucity of intestinal gas with no dilated bowel loops
or free peritoneal air seen. Left upper abdominal surgical clips. Mild thoracic
and lumbar spine degenerative changes.

IMPRESSION

1. Mild chronic bronchitic changes, improved.
2. No acute abnormality.

## 2008-06-29 ENCOUNTER — Emergency Department (HOSPITAL_COMMUNITY): Admission: EM | Admit: 2008-06-29 | Discharge: 2008-06-29 | Payer: Self-pay | Admitting: Emergency Medicine

## 2008-07-17 ENCOUNTER — Emergency Department (HOSPITAL_COMMUNITY): Admission: EM | Admit: 2008-07-17 | Discharge: 2008-07-17 | Payer: Self-pay | Admitting: Emergency Medicine

## 2008-07-26 ENCOUNTER — Emergency Department (HOSPITAL_COMMUNITY): Admission: EM | Admit: 2008-07-26 | Discharge: 2008-07-26 | Payer: Self-pay | Admitting: Emergency Medicine

## 2008-08-03 ENCOUNTER — Encounter: Admission: RE | Admit: 2008-08-03 | Discharge: 2008-08-03 | Payer: Self-pay | Admitting: Otolaryngology

## 2008-10-21 IMAGING — CR DG KNEE COMPLETE 4+V*L*
4 series · 4 of 4 positions shown · non-contrast
Comparison: none

CLINICAL DATA: Left knee pain. No known injury.

LEFT KNEE - 4 VIEW

[t knee ap left]
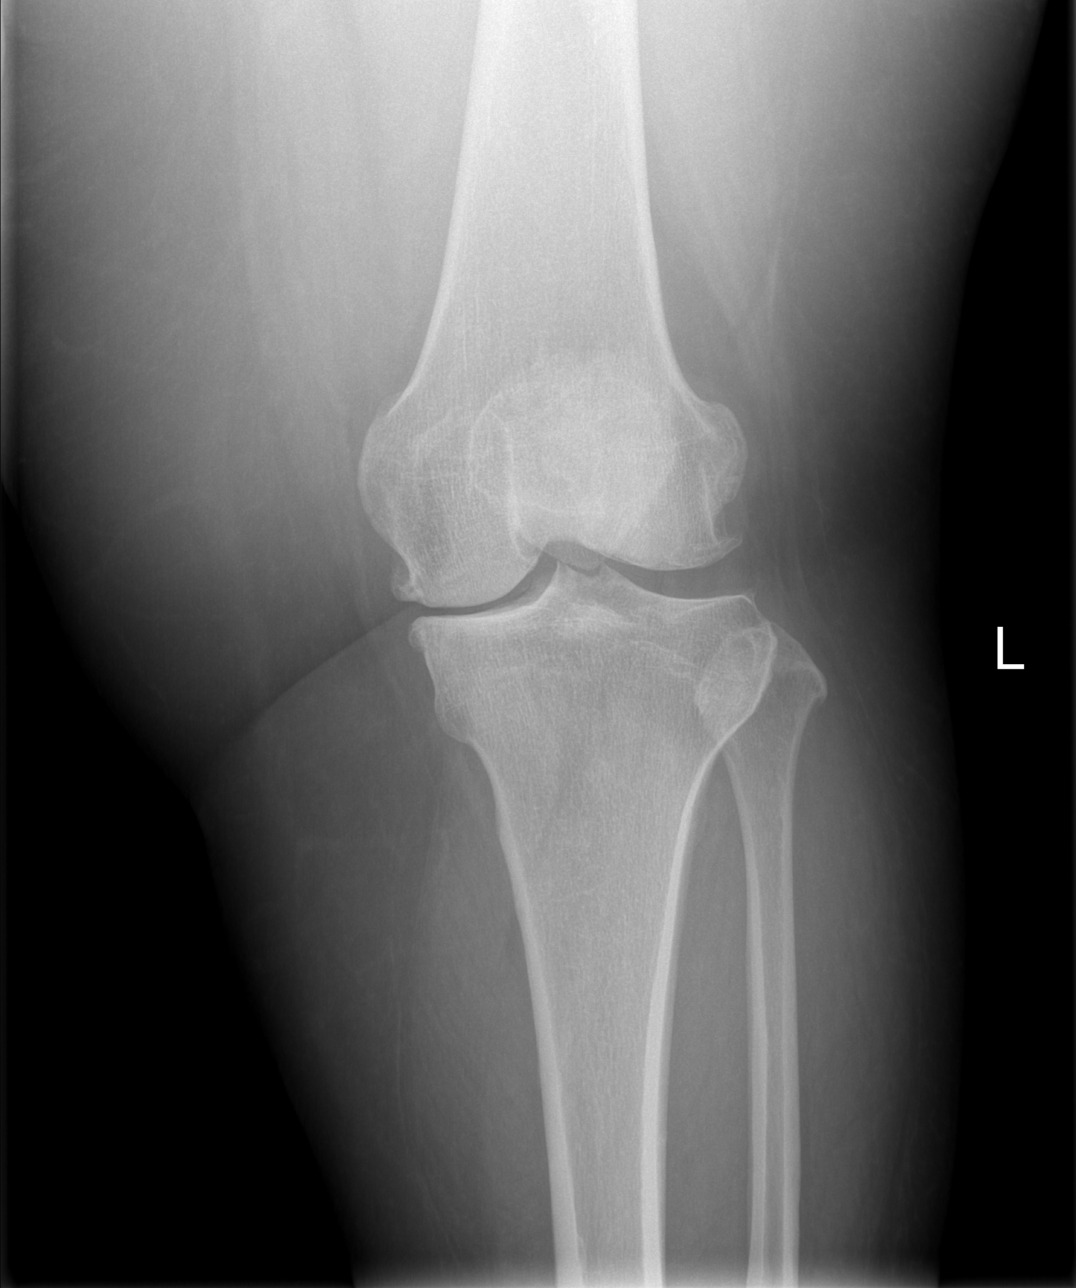

[t knee oblique left (1 of 2)]
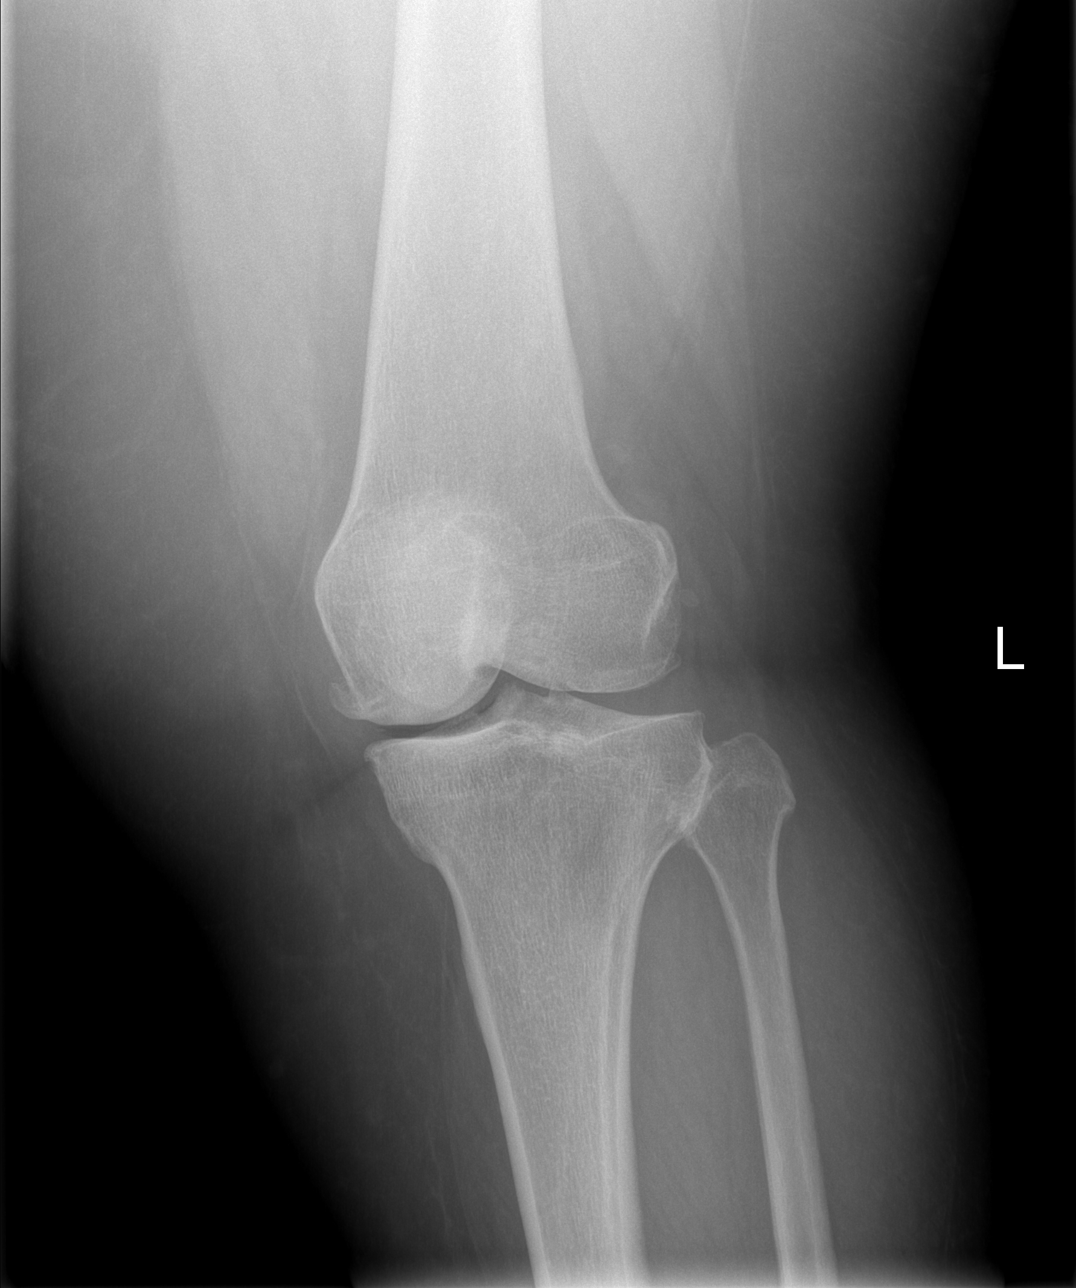

[t knee oblique left (2 of 2)]
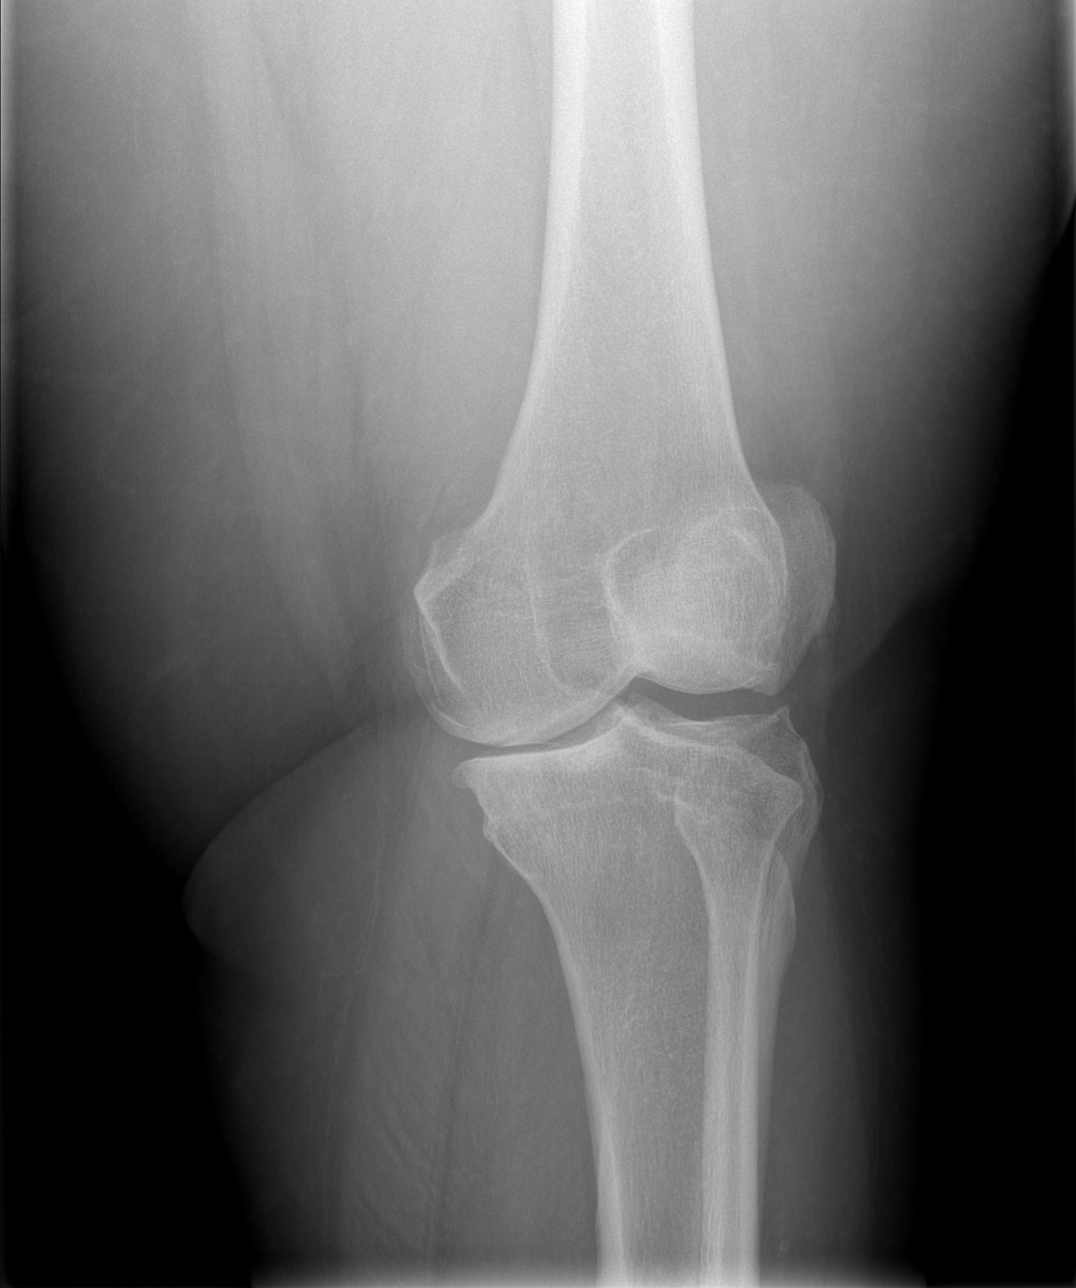

[t knee lat left]
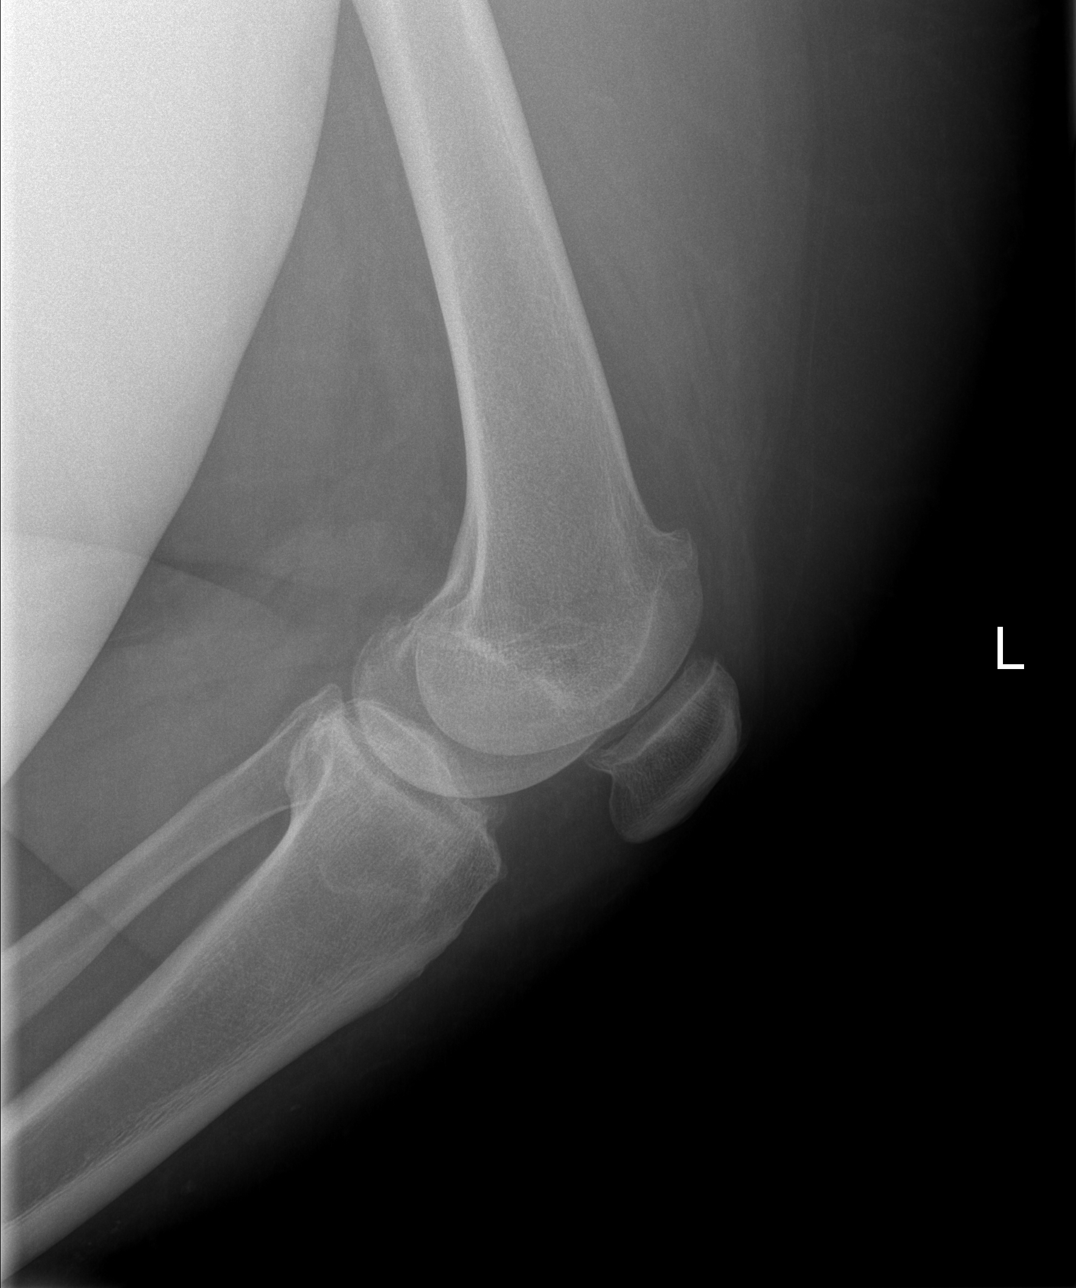

[4 of 4 positions shown; findings below may reference images not displayed]

FINDINGS: Tricompartmental degenerative spur formation. Medial joint space
narrowing. No effusion seen.

IMPRESSION

Degenerative changes.

## 2008-11-03 ENCOUNTER — Inpatient Hospital Stay (HOSPITAL_COMMUNITY): Admission: AD | Admit: 2008-11-03 | Discharge: 2008-11-03 | Payer: Self-pay | Admitting: Obstetrics

## 2008-11-04 IMAGING — CR DG CHEST 2V
2 series · 2 of 2 positions shown · non-contrast
Comparison: 08/05/06.

CLINICAL DATA: Shortness of breath, cough, back and chest pain. 
 CHEST - 2 VIEW ? 02/24/07:

[w chest pa *]
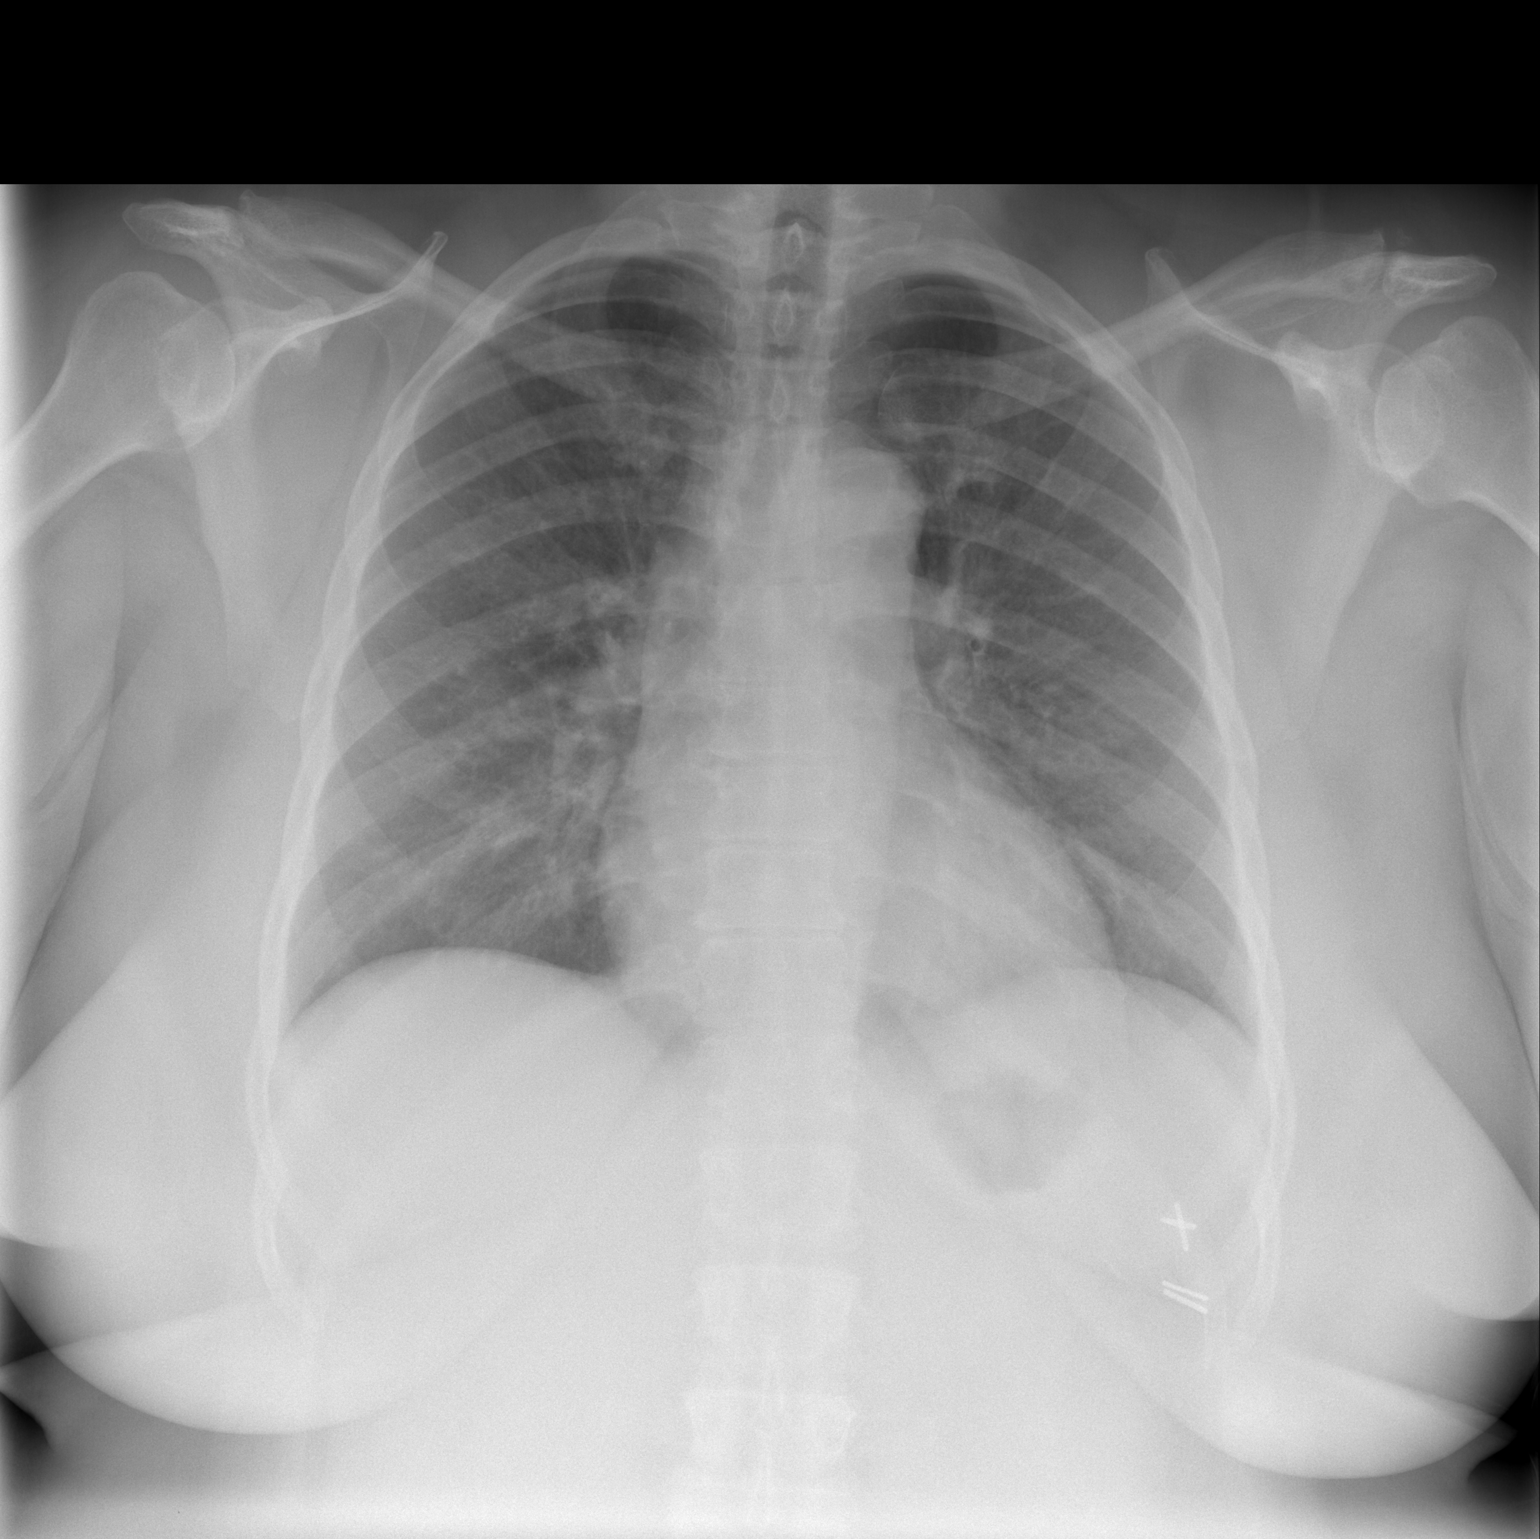

[w chest lat *]
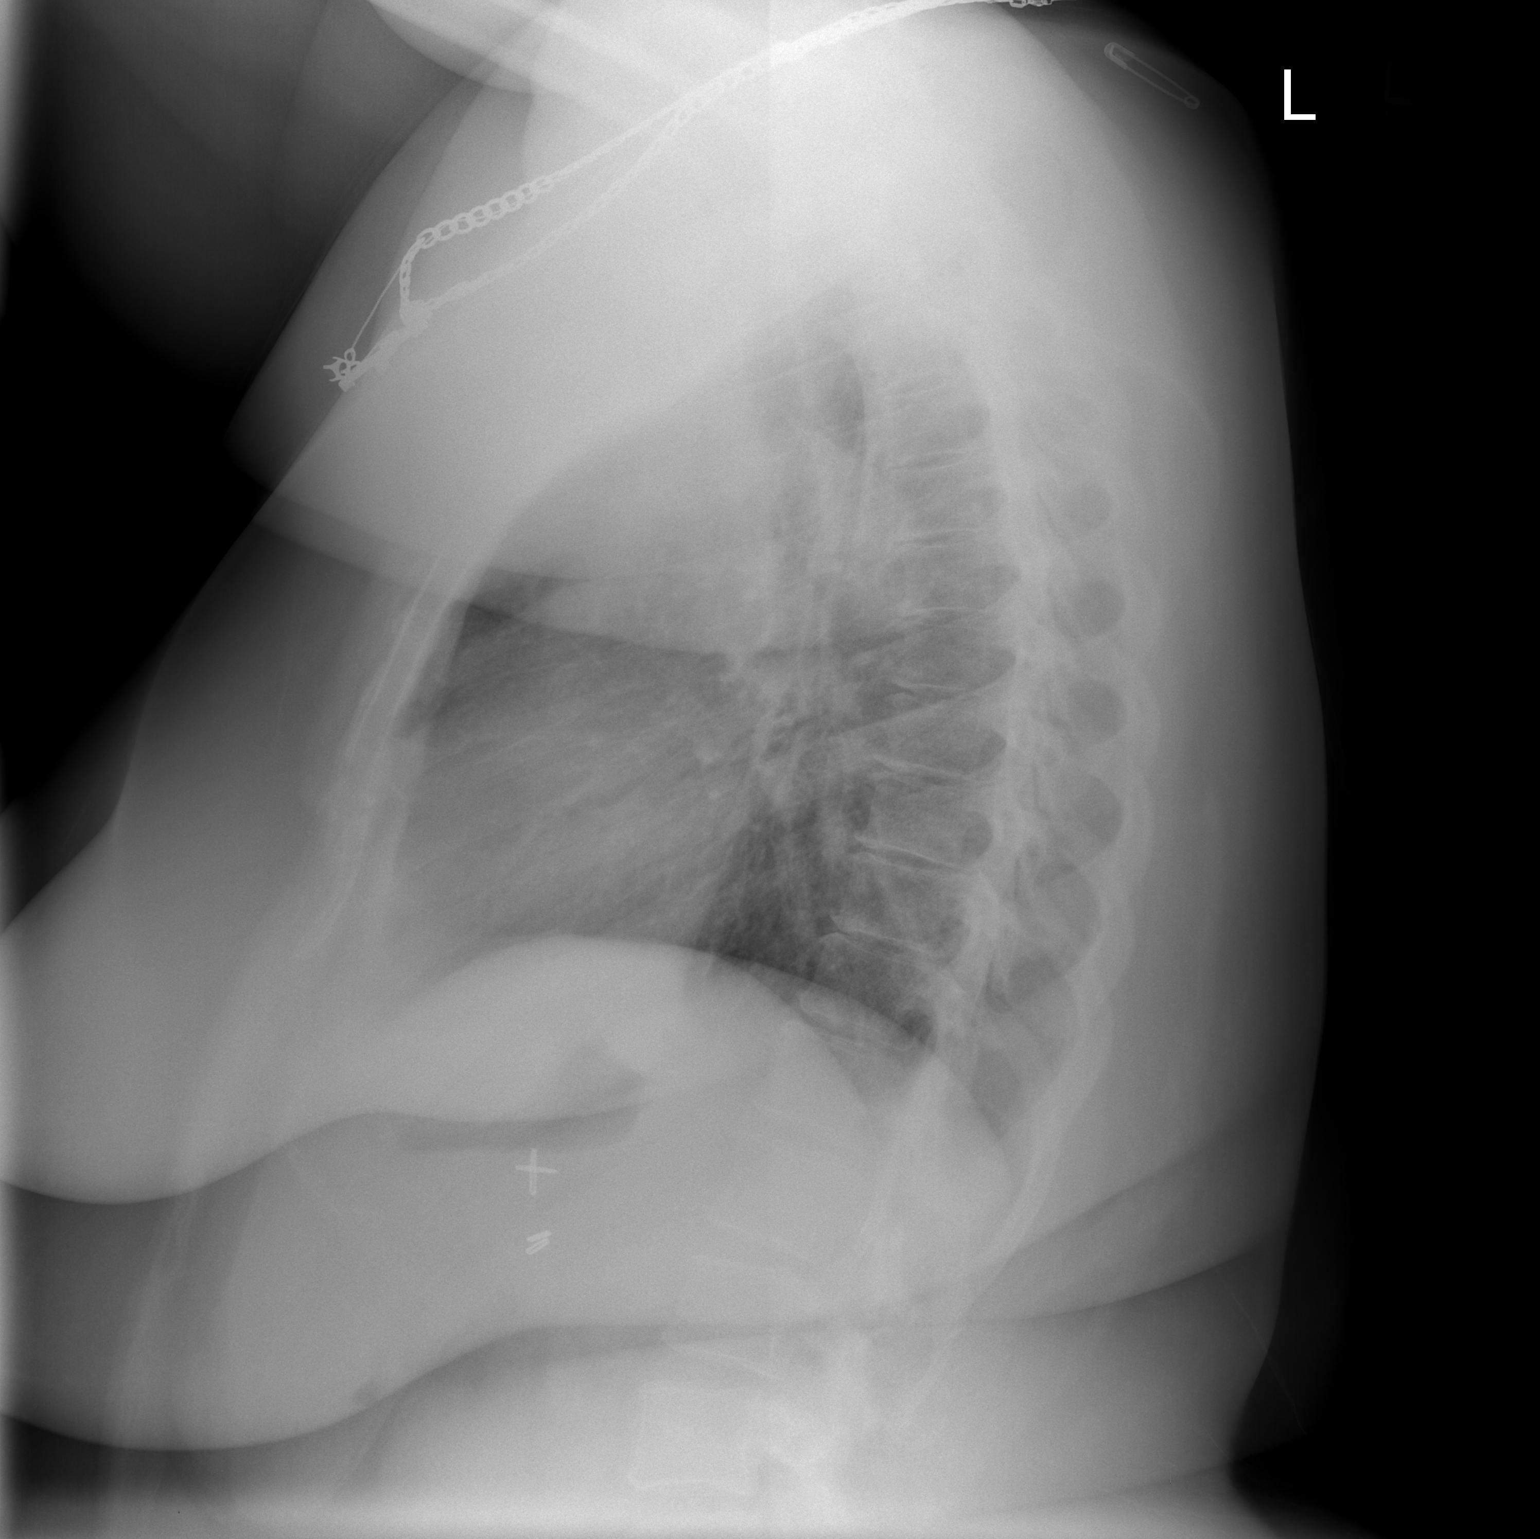

[2 of 2 positions shown; findings below may reference images not displayed]

FINDINGS: The trachea is midline.  Heart size is stable. The lungs are clear.  No pleural fluid.
IMPRESSION: No acute findings.

## 2008-11-20 ENCOUNTER — Inpatient Hospital Stay (HOSPITAL_COMMUNITY): Admission: EM | Admit: 2008-11-20 | Discharge: 2008-11-22 | Payer: Self-pay | Admitting: Emergency Medicine

## 2008-12-21 ENCOUNTER — Encounter: Admission: RE | Admit: 2008-12-21 | Discharge: 2008-12-21 | Payer: Self-pay | Admitting: Internal Medicine

## 2009-04-06 ENCOUNTER — Emergency Department (HOSPITAL_COMMUNITY): Admission: EM | Admit: 2009-04-06 | Discharge: 2009-04-06 | Payer: Self-pay | Admitting: Emergency Medicine

## 2009-04-15 ENCOUNTER — Emergency Department (HOSPITAL_COMMUNITY): Admission: EM | Admit: 2009-04-15 | Discharge: 2009-04-15 | Payer: Self-pay | Admitting: Emergency Medicine

## 2009-04-17 ENCOUNTER — Emergency Department (HOSPITAL_COMMUNITY): Admission: EM | Admit: 2009-04-17 | Discharge: 2009-04-17 | Payer: Self-pay | Admitting: Emergency Medicine

## 2009-04-30 ENCOUNTER — Inpatient Hospital Stay (HOSPITAL_COMMUNITY): Admission: EM | Admit: 2009-04-30 | Discharge: 2009-05-04 | Payer: Self-pay | Admitting: Emergency Medicine

## 2009-05-07 IMAGING — CR DG CHEST 2V
2 series · 2 of 2 positions shown · non-contrast
Comparison: none

CLINICAL DATA: Chest pain. 
 CHEST - 2 VIEW - 08/27/07: 
 Comparison Exam:   02/24/07.

[w chest pa *]
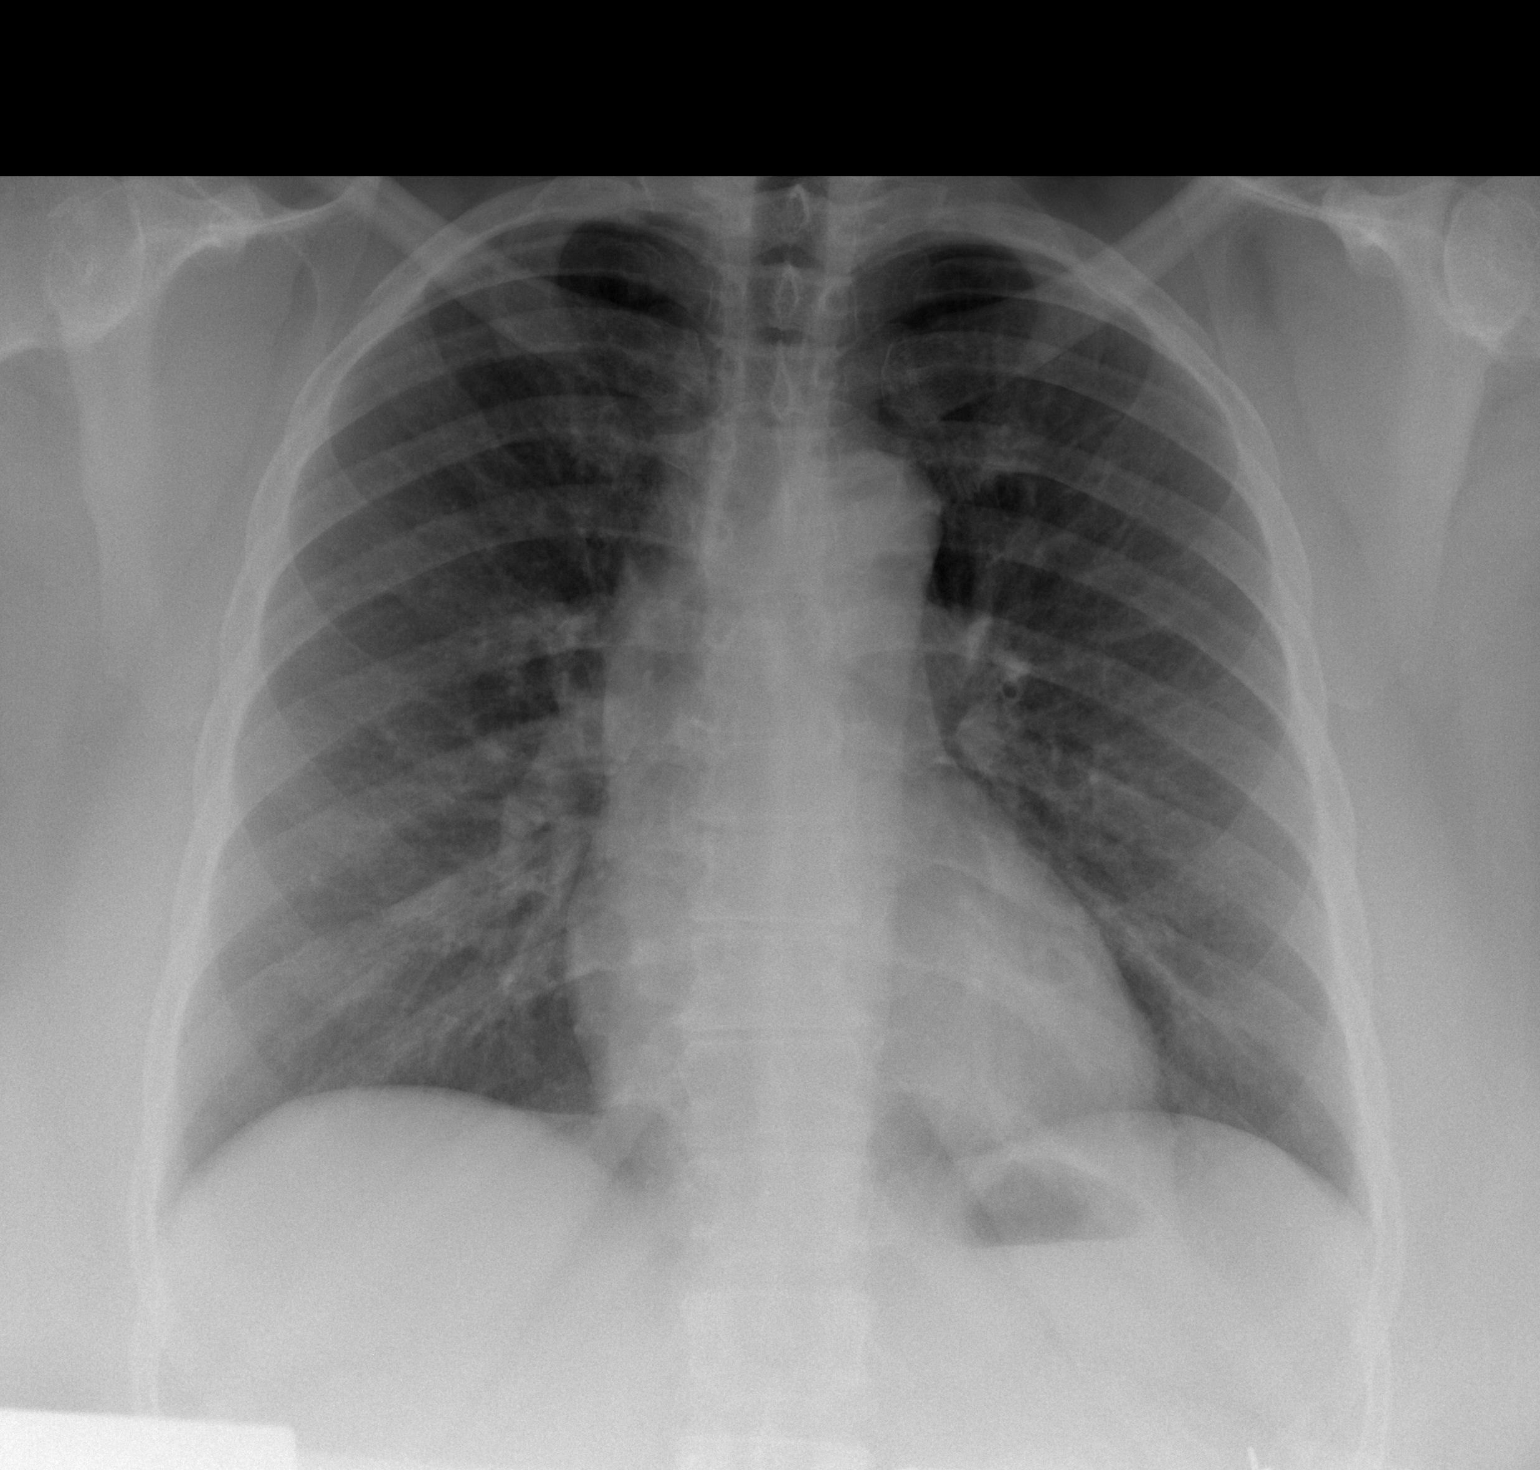

[w chest lat *]
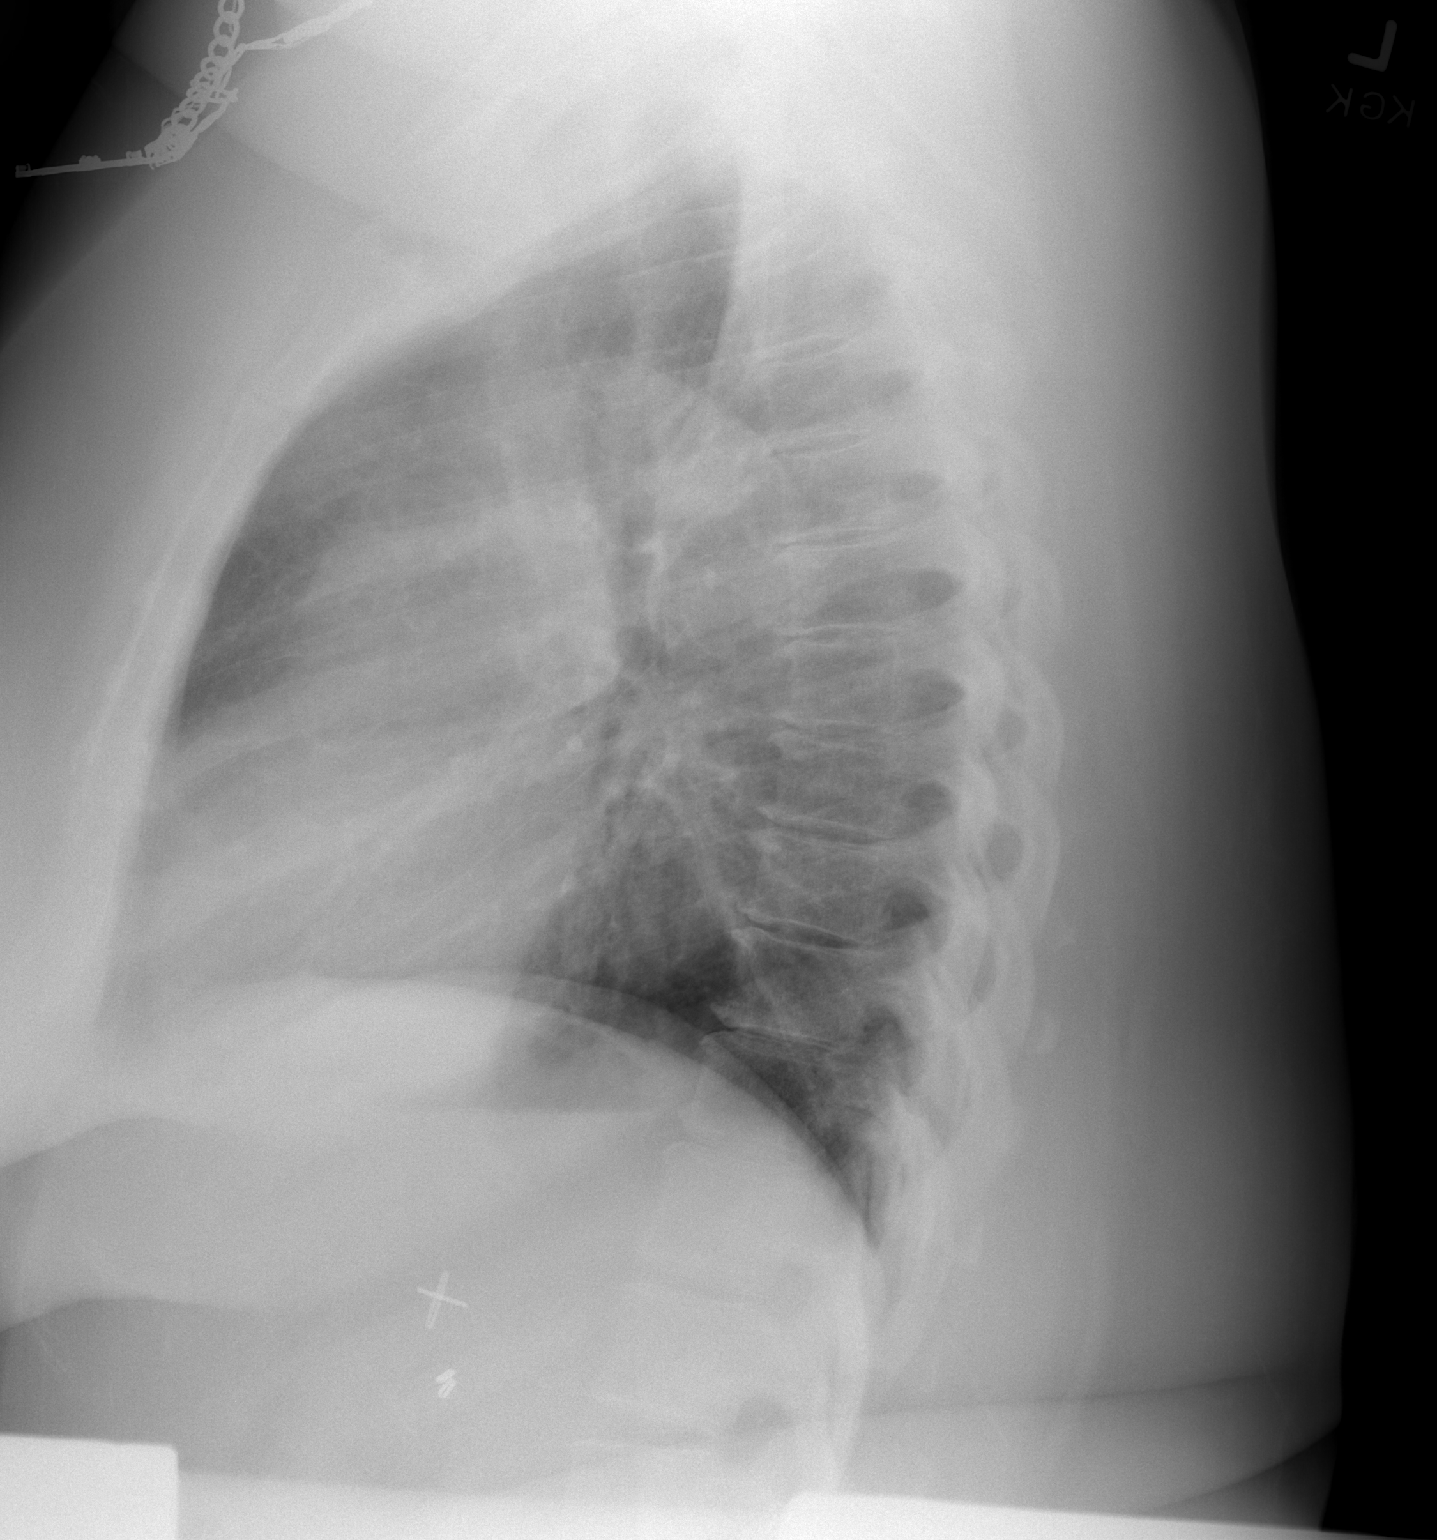

[2 of 2 positions shown; findings below may reference images not displayed]

FINDINGS: The cardiomediastinal silhouette is stable.  Chronic mild interstitial prominence and mild central peribronchial thickening is stable.  There is no active infiltrate.  There is no pulmonary edema.  Mild degenerative changes of the thoracic spine are seen.
IMPRESSION: Probable chronic mild bronchitic changes and mild interstitial prominence.  No active infiltrate.  Mild degenerative changes of the thoracic spine.

## 2009-07-18 IMAGING — MG MM DIAGNOSTIC BILATERAL
4 series · 4 of 4 positions shown · non-contrast
Comparison: Prior studies.

DG DIAGNOSTIC BILATERAL
Bilateral CC and MLO view(s) were taken.

DIGITAL BILATERAL DIAGNOSTIC MAMMOGRAM WITH CAD:
CLINICAL DATA: Pruritus associated with the nipples bilaterally for two to three years.  The 
patient has noted no skin changes or discharge associated with the nipples.  Also, the patient 
states that she has noted irritation with small areas of nodularity within the axillary regions 
bilaterally following shaving.  She states she was treated by her physician for an infection 
related to this (likely folliculitis).

[R CC]
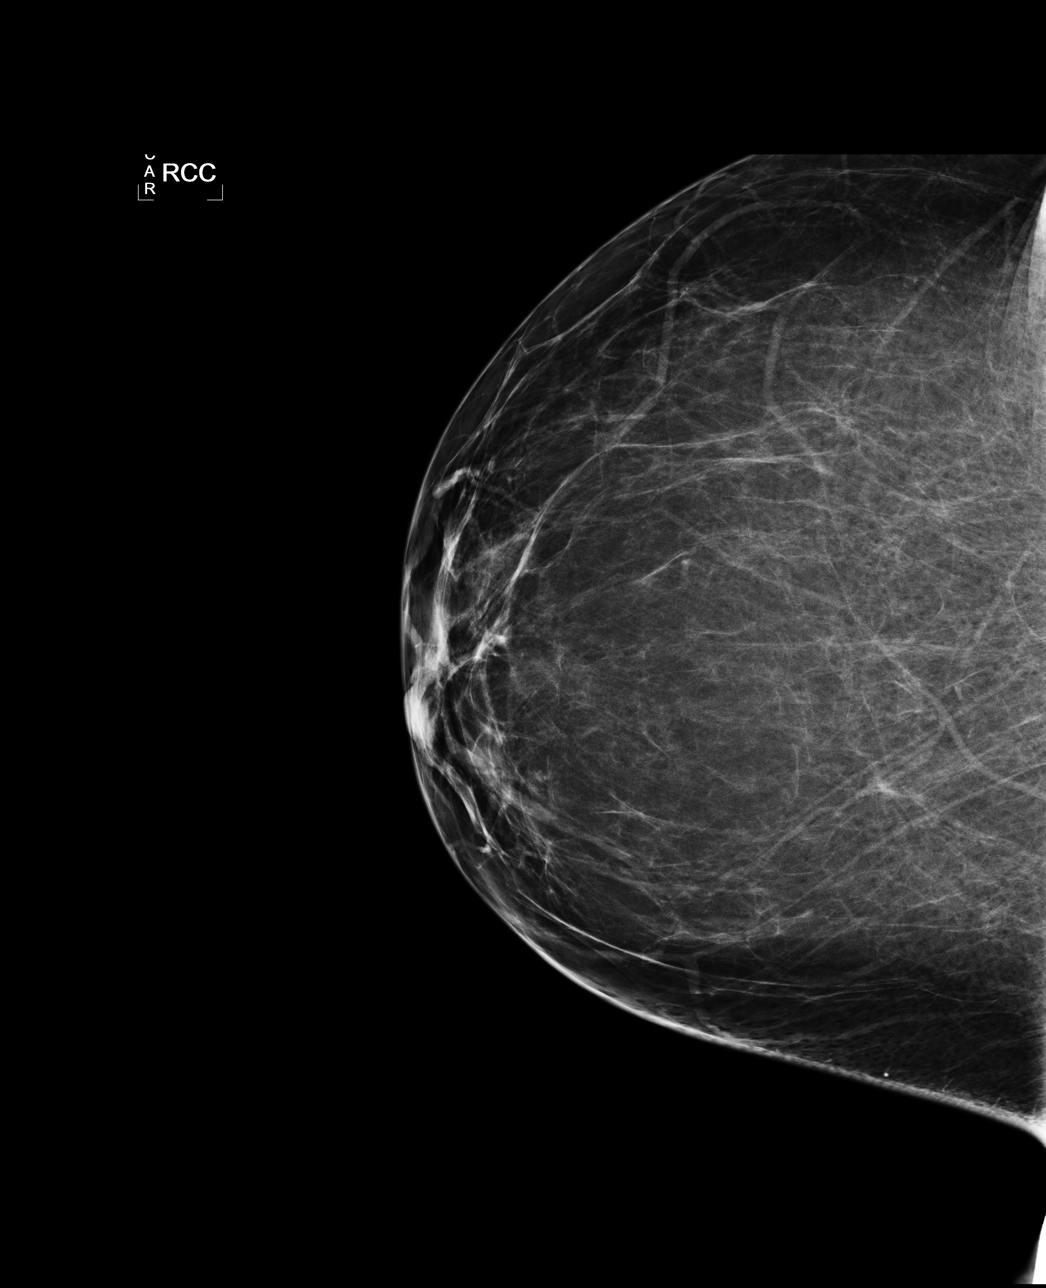

[L CC]
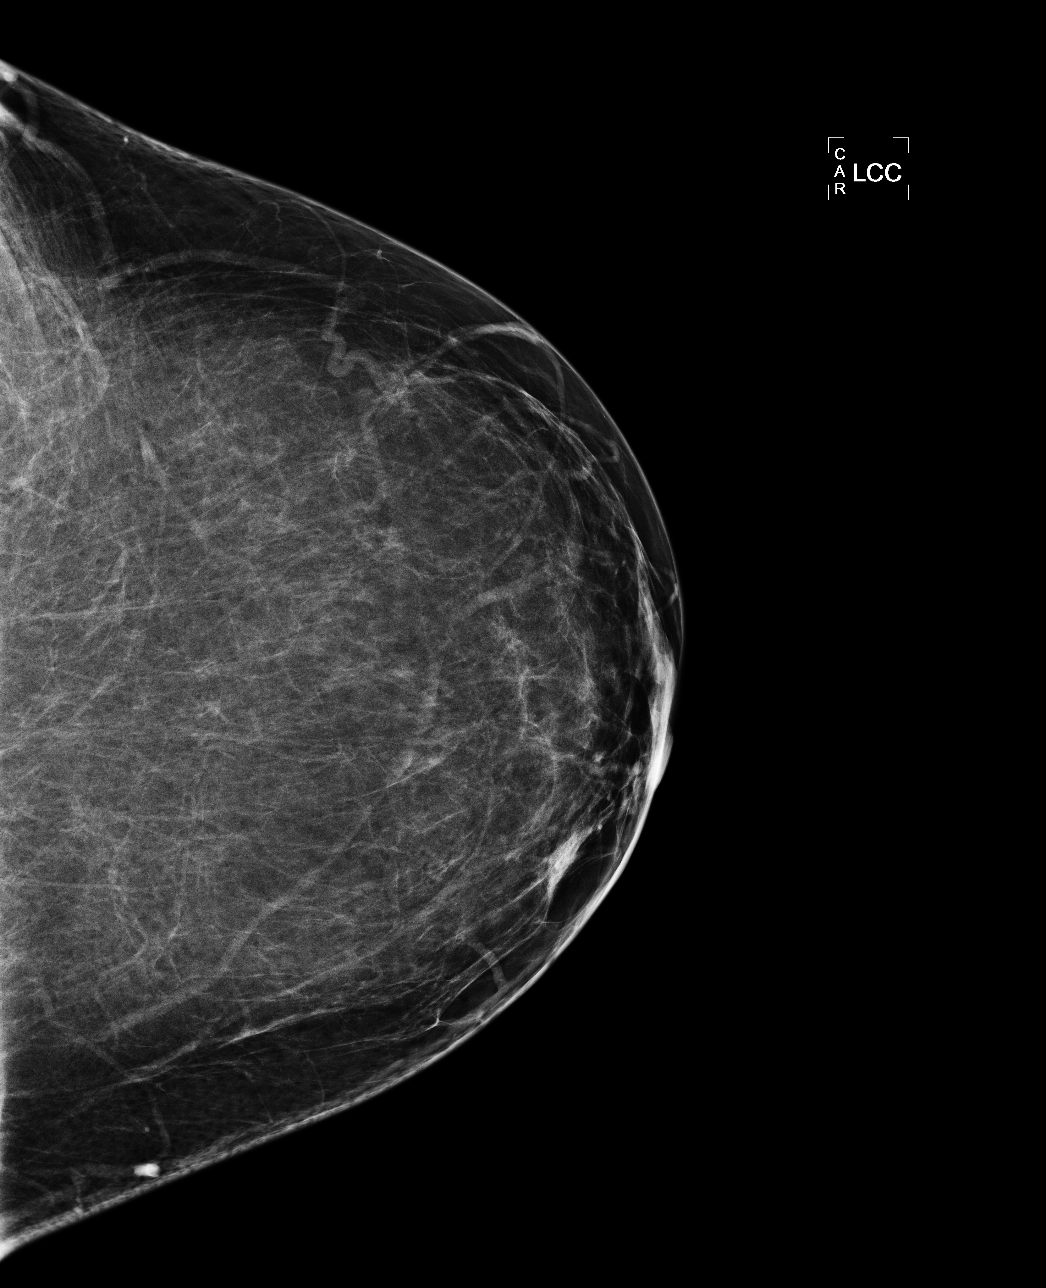

[L MLO]
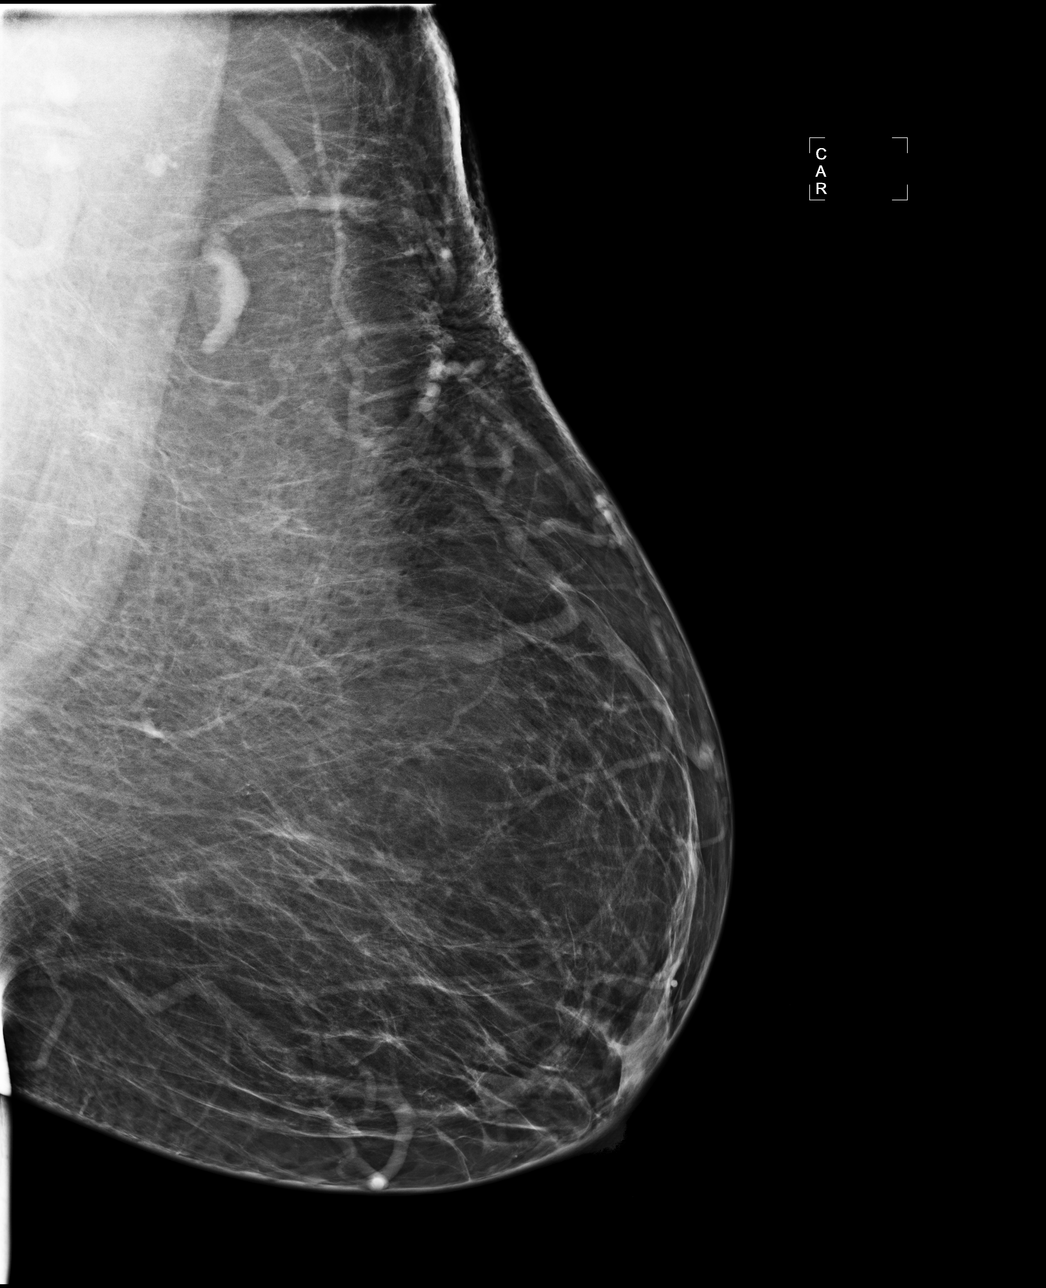

[R MLO]
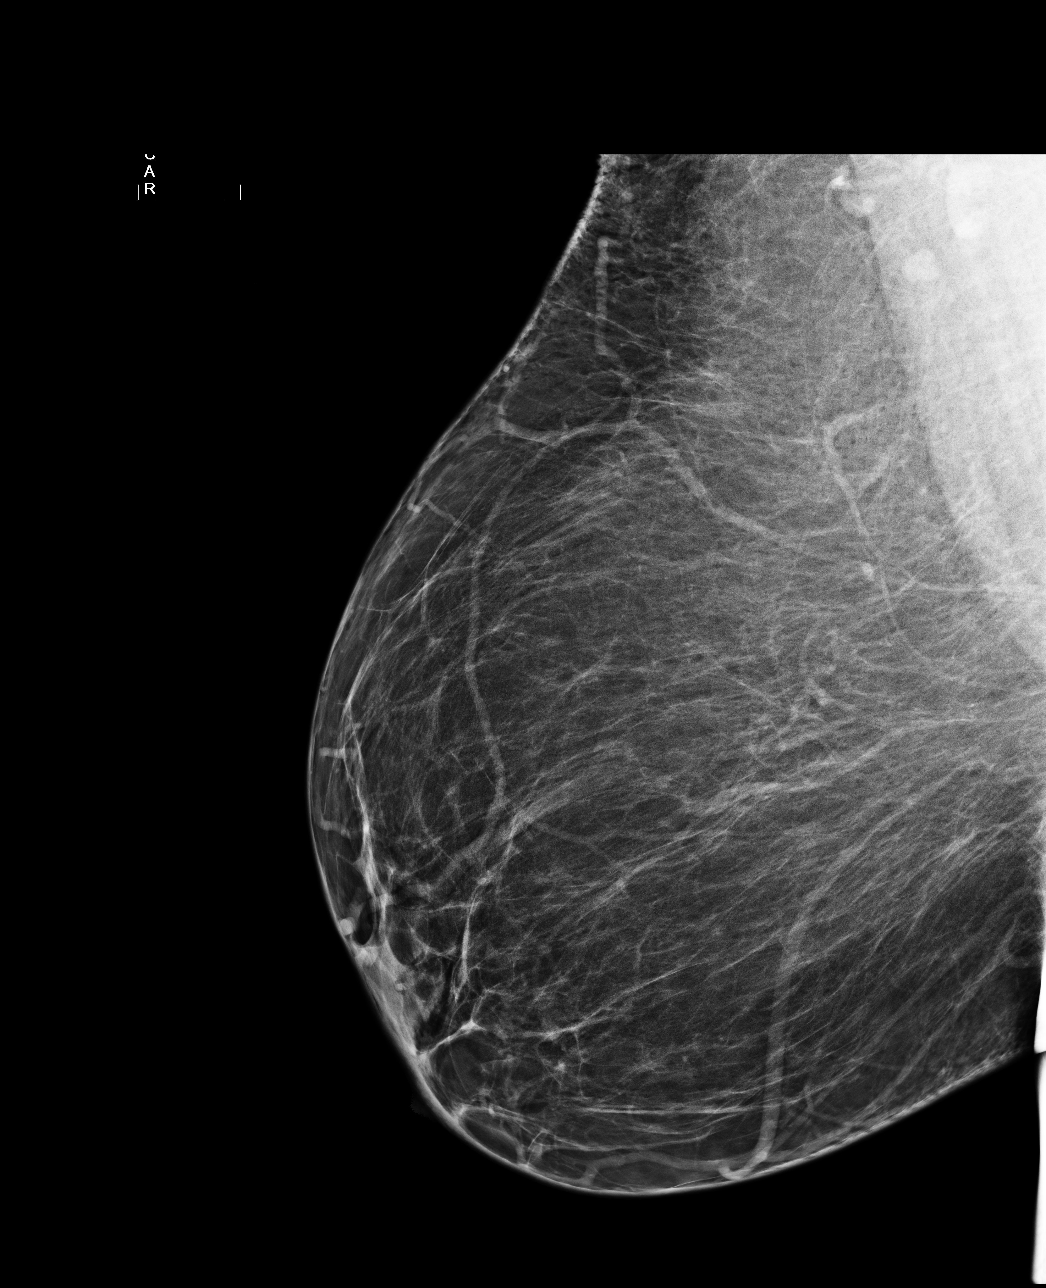

[4 of 4 positions shown; findings below may reference images not displayed]

There is a fibrofatty parenchymal pattern present which is stable.  There is no evidence for a mass
and there are no areas of distortion or suspicious microcalcification. The axillary regions have a
normal appearance.

On my directed physical examination of the breast, there is no discoloration or crusting associated
with the nipple region and there is no discharge.  There are no discrete palpable abnormalities 
within the axillary regions.

No specific evidence for malignancy.  Recommend annual screening mammography.

ASSESSMENT: Negative - BI-RADS 1

Routine screening mammogram in 1 year.
ANALYZED BY COMPUTER AIDED DETECTION. , THIS PROCEDURE WAS A DIGITAL MAMMOGRAM

## 2009-08-21 ENCOUNTER — Emergency Department (HOSPITAL_COMMUNITY): Admission: EM | Admit: 2009-08-21 | Discharge: 2009-08-21 | Payer: Self-pay | Admitting: Emergency Medicine

## 2009-09-29 ENCOUNTER — Emergency Department (HOSPITAL_COMMUNITY): Admission: EM | Admit: 2009-09-29 | Discharge: 2009-09-29 | Payer: Self-pay | Admitting: Emergency Medicine

## 2009-11-02 IMAGING — CR DG CHEST 2V
2 series · 2 of 2 positions shown · non-contrast
Comparison: 08/27/2007 and earlier.

CLINICAL DATA: 52-year-old female with cough, fever and congestion.

CHEST - 2 VIEW

[w chest pa *]
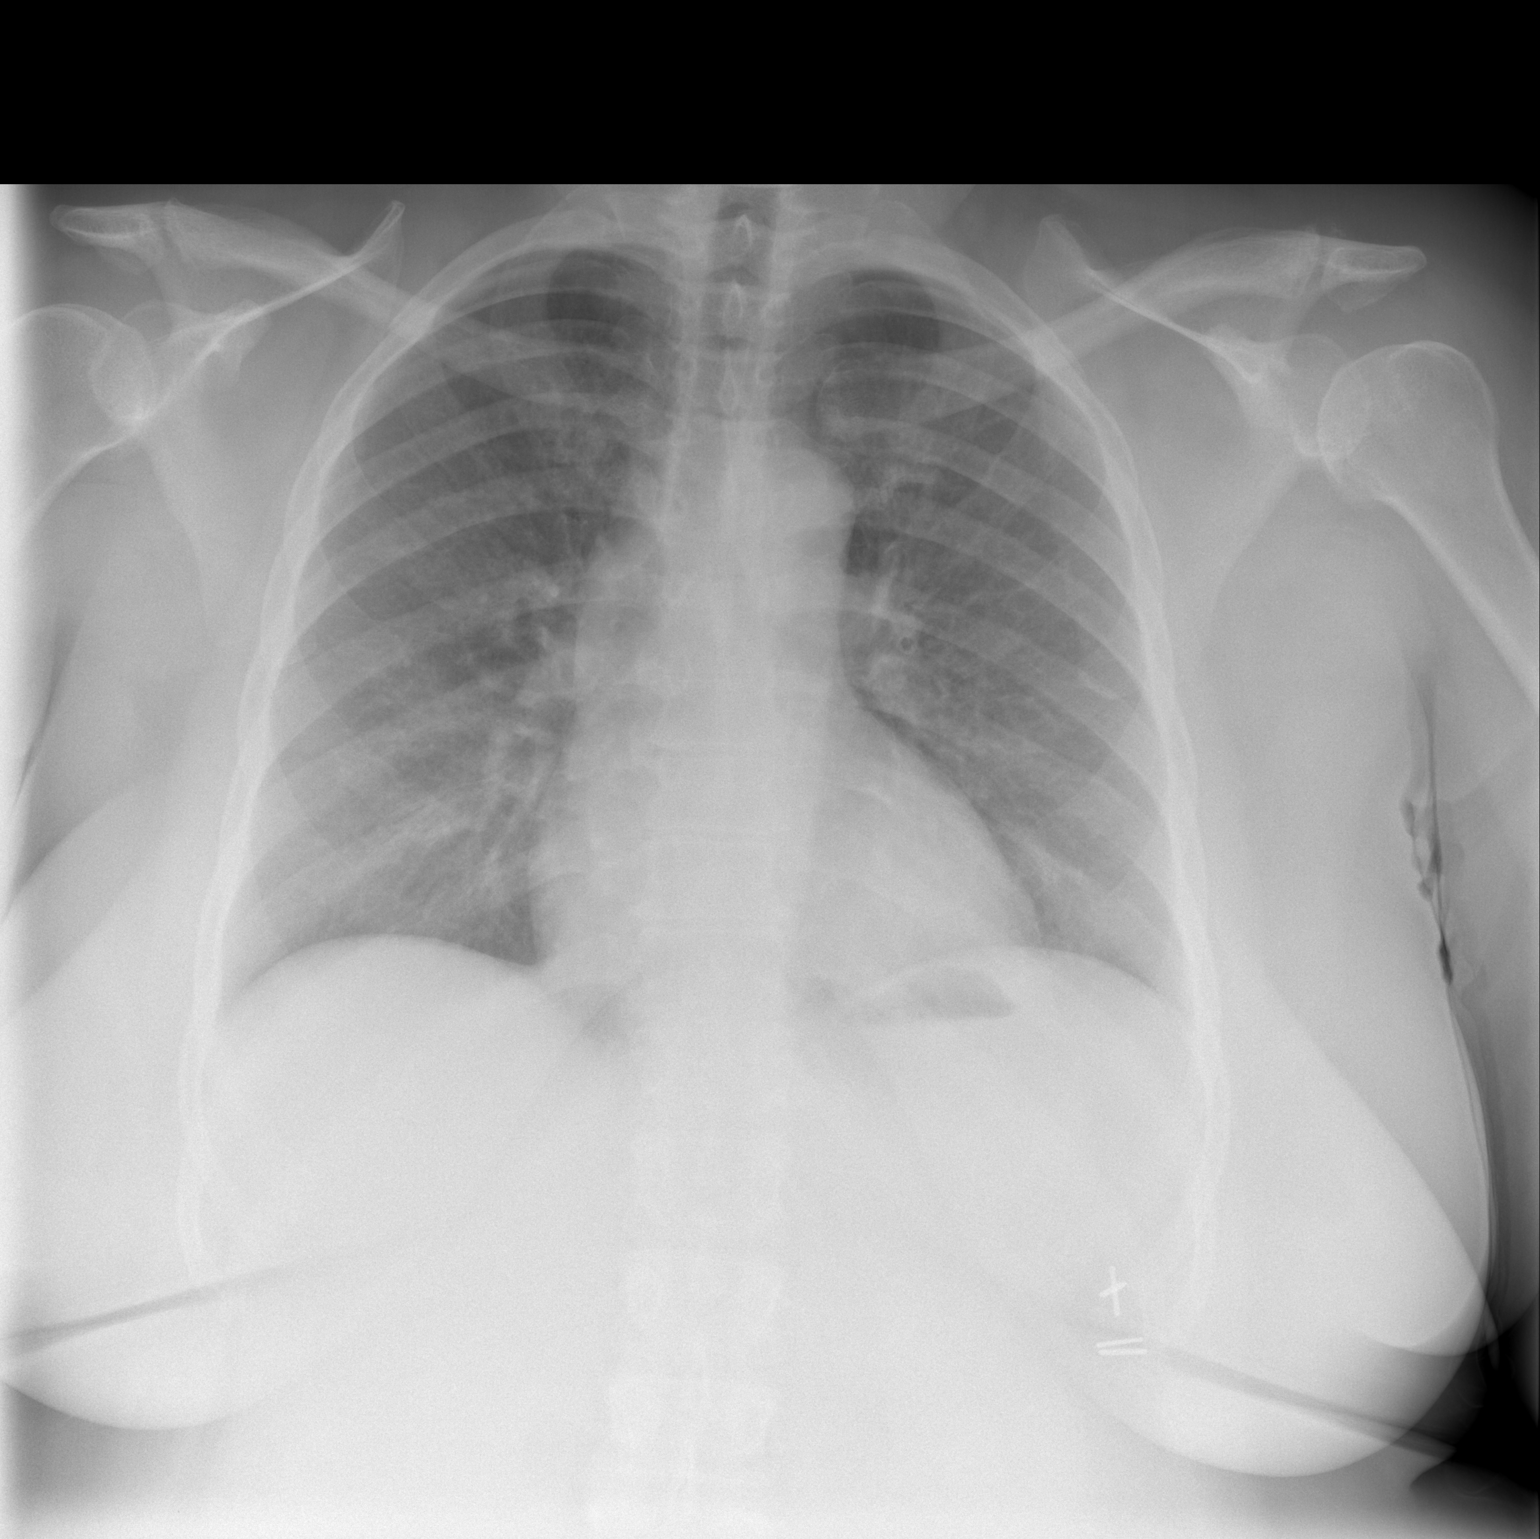

[w chest lat *]
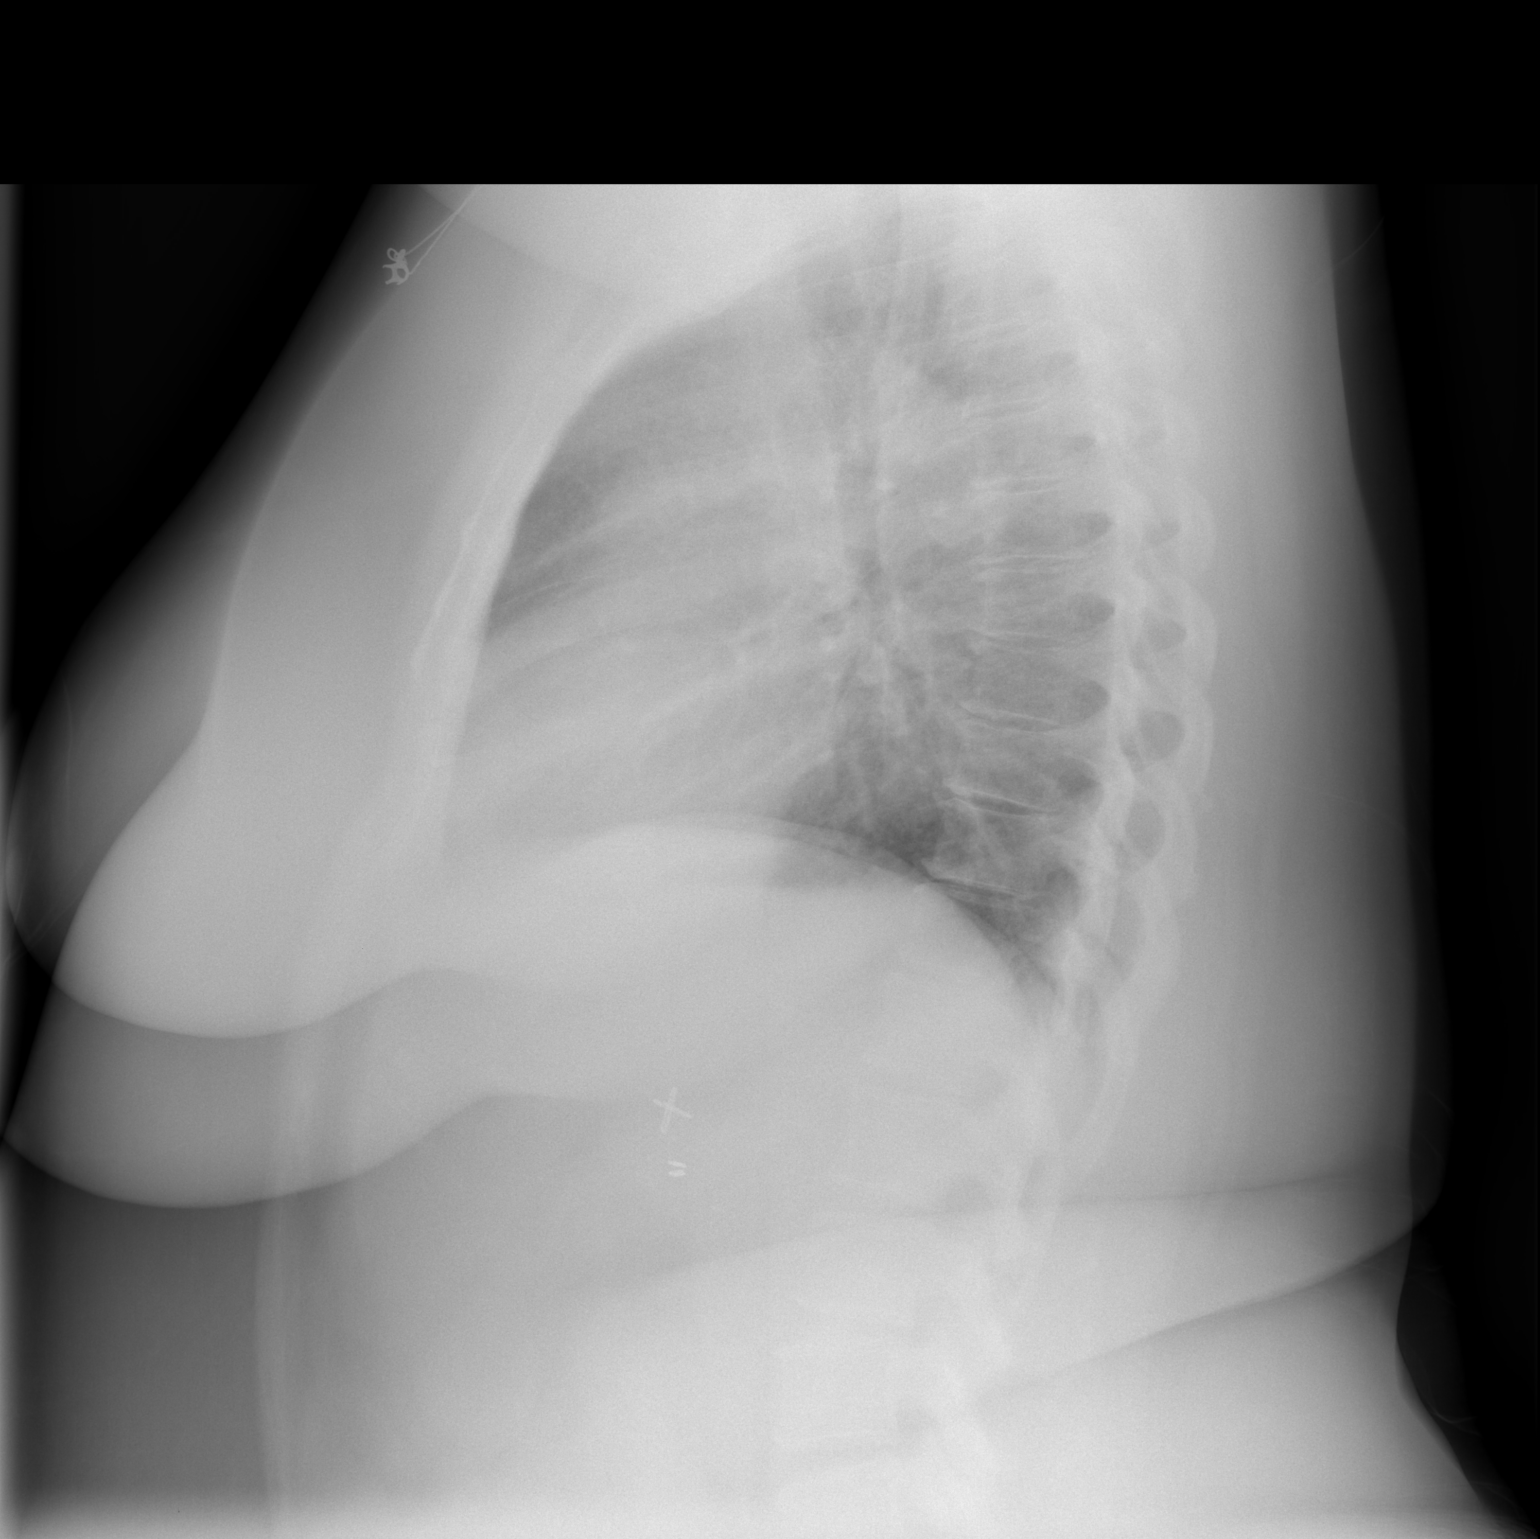

[2 of 2 positions shown; findings below may reference images not displayed]

FINDINGS: Stable cardiac size and mediastinal contours.  No
cardiomegaly. Stable visualized osseous structures.  Stable lung
volumes.  No pneumothorax, pulmonary edema, pleural effusion or
consolidation.  No acute airspace opacity.  Stable surgical clips
in the left upper quadrant.  Tracheal air column is within normal
limits.
IMPRESSION: No acute cardiopulmonary abnormality.

## 2009-11-22 ENCOUNTER — Emergency Department (HOSPITAL_COMMUNITY): Admission: EM | Admit: 2009-11-22 | Discharge: 2009-11-22 | Payer: Self-pay | Admitting: Emergency Medicine

## 2009-12-23 IMAGING — CR DG CHEST 1V PORT
1 series · 1 of 1 positions shown · non-contrast
Comparison: 02/22/2008

CLINICAL DATA: Chest pain.  Cough.  Short of breath.

PORTABLE CHEST - 1 VIEW

[view not recorded]
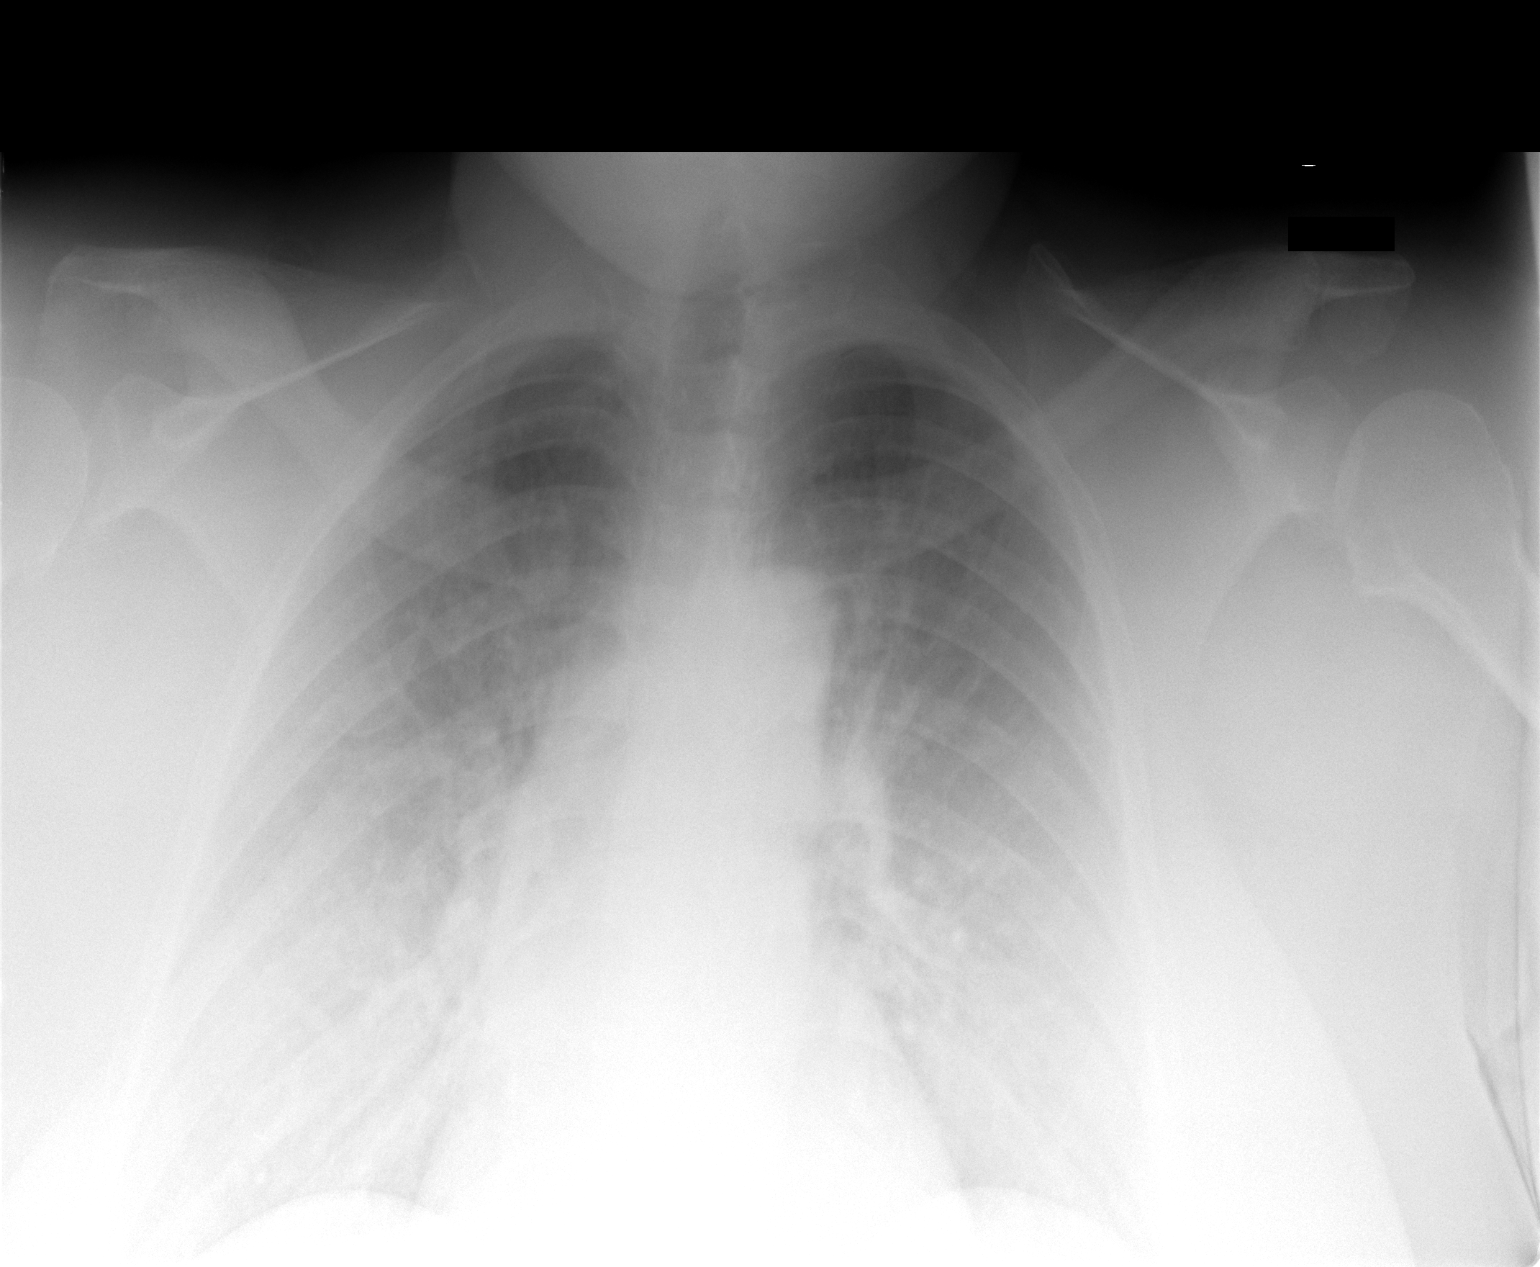

[1 of 1 positions shown; findings below may reference images not displayed]

FINDINGS: Exam is technically limited.  Reverse lordotic
projection.  Exam is also under penetrated.  No gross focal
consolidation is identified.  The trachea appears midline.
Allowing for projection, cardiopericardial silhouette is likely
within normal limits.
IMPRESSION: Technically limited study.  No gross abnormality.

## 2009-12-24 IMAGING — CT CT ANGIO CHEST
2 of 5 series · 19 of 36 positions shown · IV contrast (APPLIED)
Comparison: Chest radiograph 04/13/2008

CLINICAL DATA: chest pain.  Cough.  Short of breath.

CT ANGIOGRAPHY CHEST
TECHNIQUE: Multidetector CT imaging of the chest using the
standard protocol during bolus administration of intravenous
contrast. Multiplanar reconstructed images including MIPs were
obtained and reviewed to evaluate the vascular anatomy.
Contrast: 80 ml Smnipaque-B[facility].

[Series 7: pe 1.0 b40f thins for pacs · axial · 0.63mm/px · z∈[+1156,+1341]mm · 18 of 207 slices shown]
[im 11/207  lung]
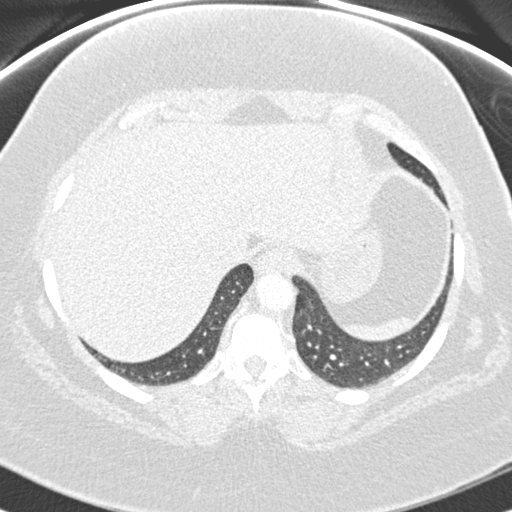
[im 21/207  mediastinal]
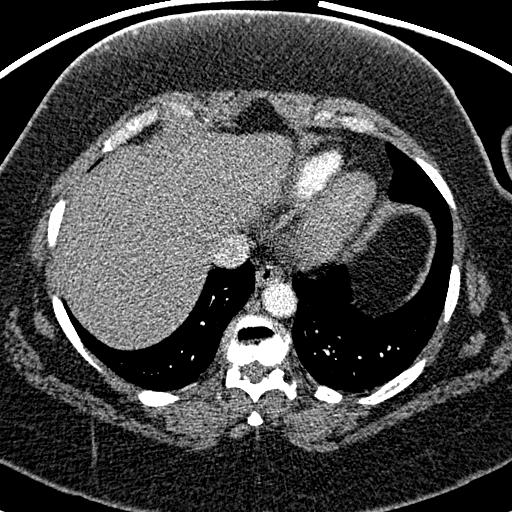
[im 31/207  lung]
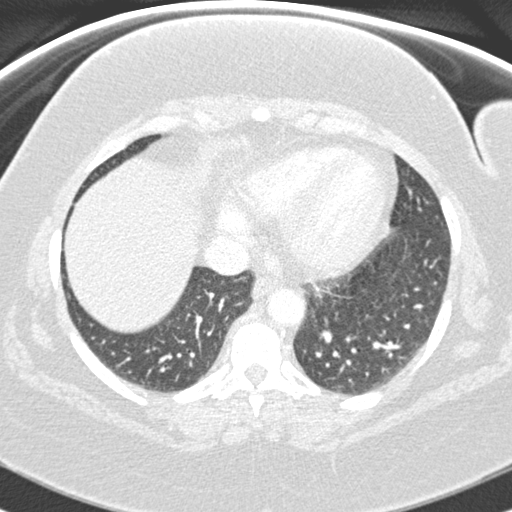
[im 42/207  mediastinal]
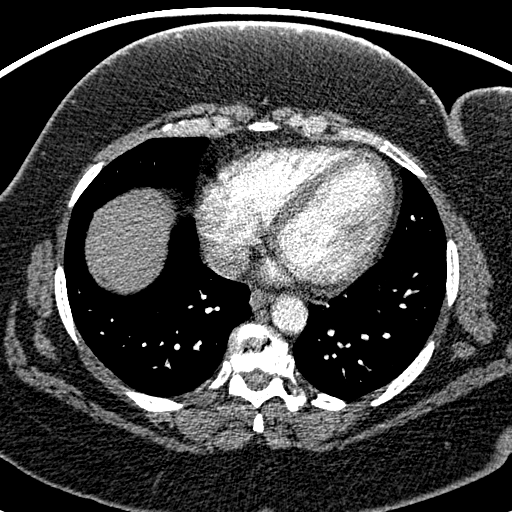
[im 52/207  lung]
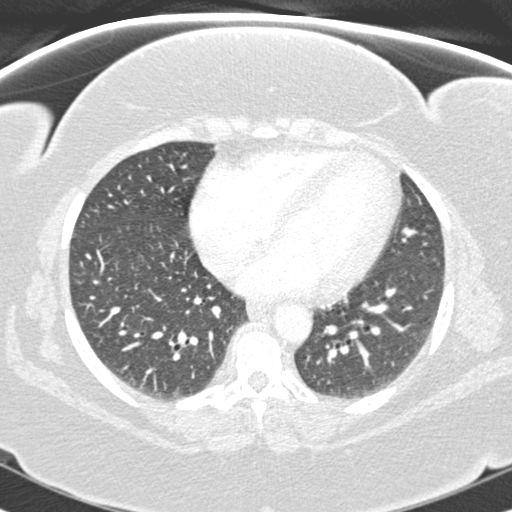
[im 62/207  mediastinal]
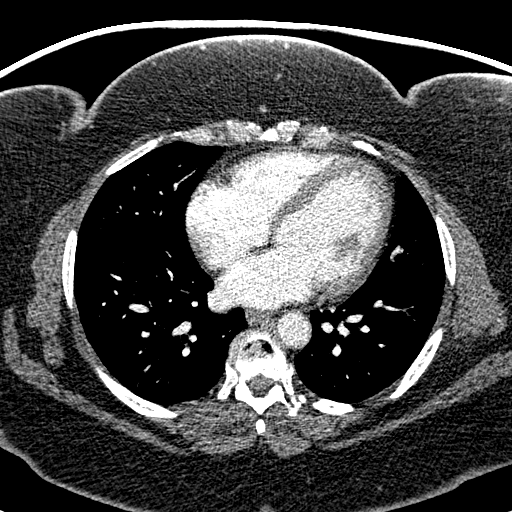
[im 73/207  lung]
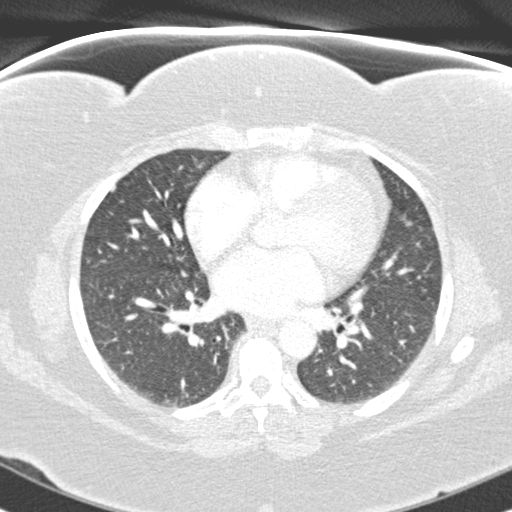
[im 83/207  mediastinal]
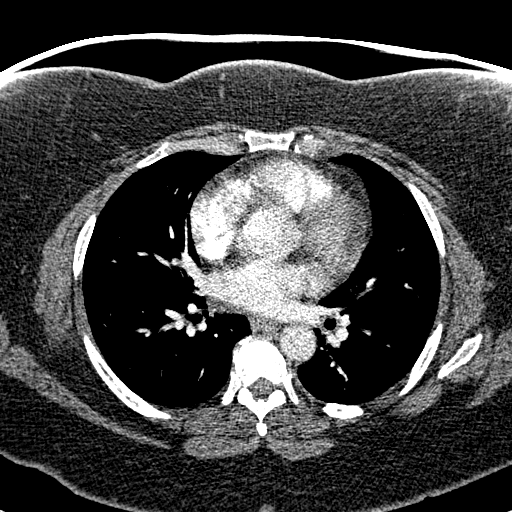
[im 93/207  lung]
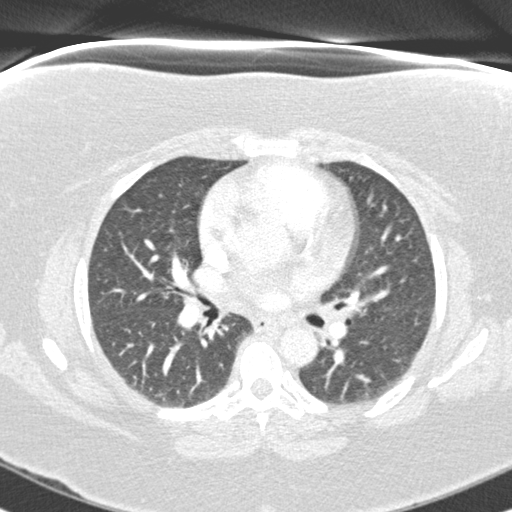
[im 114/207  mediastinal]
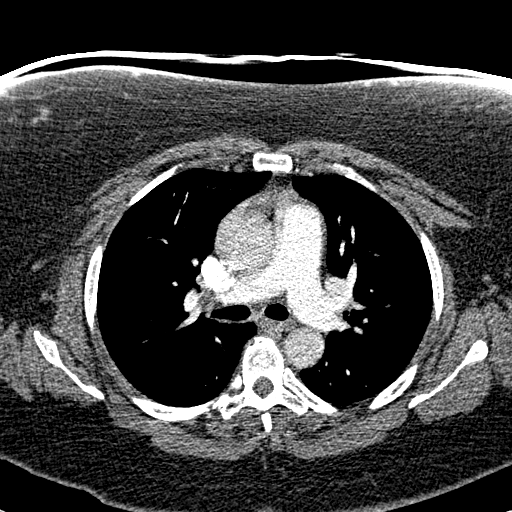
[im 124/207  lung]
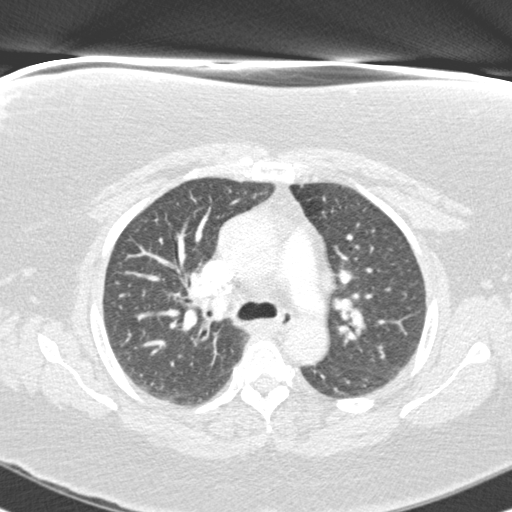
[im 134/207  mediastinal]
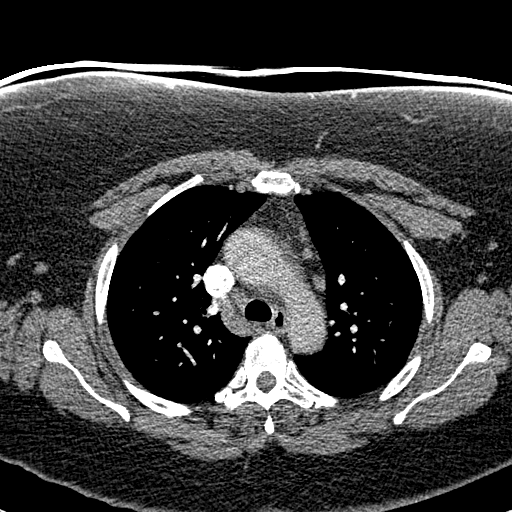
[im 145/207  lung]
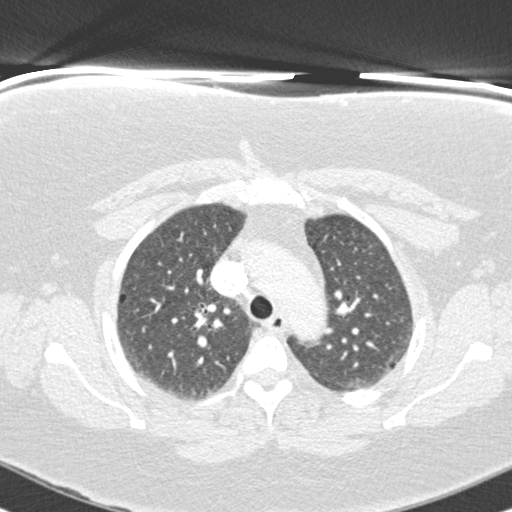
[im 155/207  mediastinal]
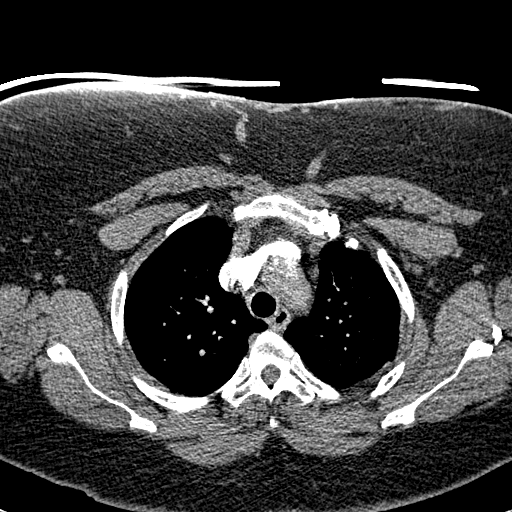
[im 165/207  lung]
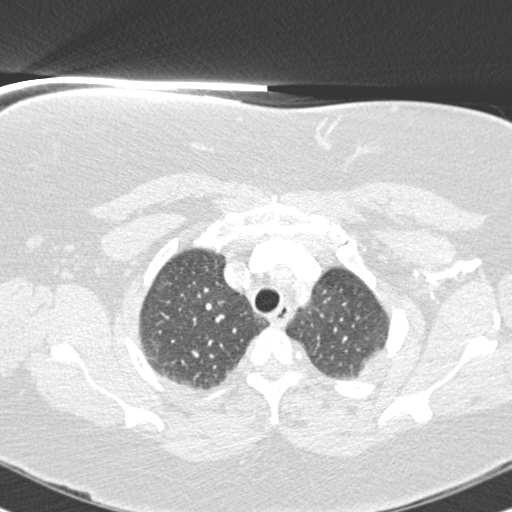
[im 176/207  mediastinal]
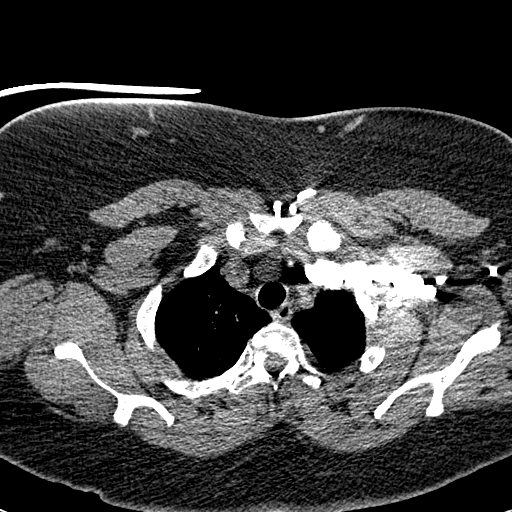
[im 186/207  lung]
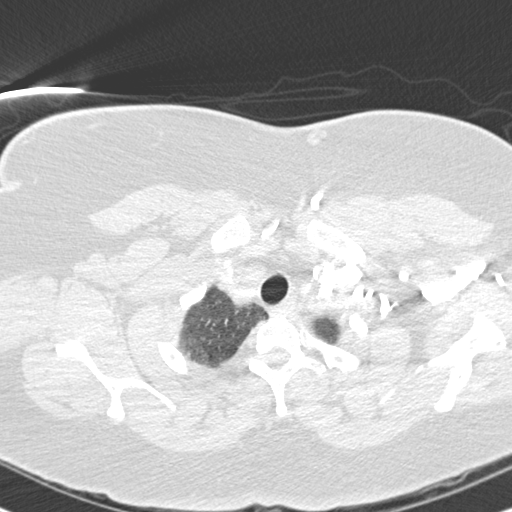
[im 196/207  mediastinal]
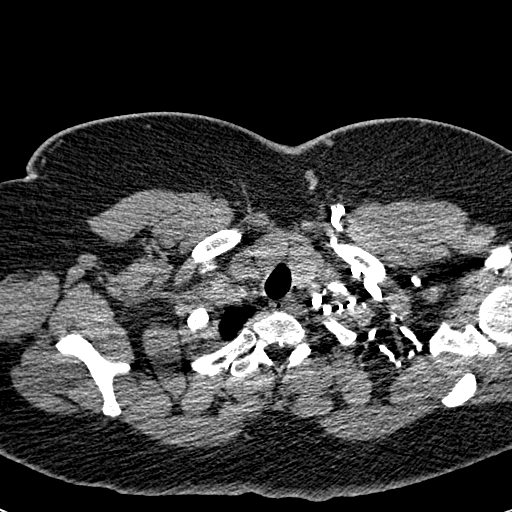

[Series 604: <mpr thick range> · coronal · 0.63mm/px · 1 of 93 slices shown]
[im 47/93  mediastinal]
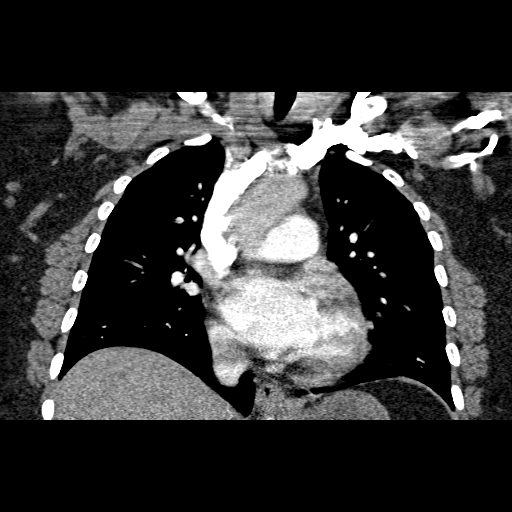

[19 of 36 positions shown; findings below may reference images not displayed]

FINDINGS: Technically adequate study to evaluate for pulmonary
embolus to the level of segmental pulmonary vessels.  No pulmonary
embolus is present.  Coronary artery atherosclerosis is present.
Grossly the heart appears normal.  No pericardial or pleural
effusion.  No mediastinal, hilar, or axillary lymphadenopathy.
Lungs demonstrate dependent atelectasis.  No airspace disease is
present.  Thoracic spondylosis.
IMPRESSION: 1.  No pulmonary embolus identified.  Sub segmental vessels not
evaluated.
2.  Coronary artery atherosclerosis. If office based assessment of
coronary risk factors has not been performed, it is now
recommended.

## 2009-12-25 IMAGING — CR DG ABDOMEN ACUTE W/ 1V CHEST
3 series · 3 of 3 positions shown · non-contrast
Comparison: Recent chest and abdomen radiographs and chest CT.

CLINICAL DATA: Lower mid abdominal pain.  Shortness of breath.
Wheezing.  Surgery for colon cancer in 4113.

ACUTE ABDOMEN SERIES (ABDOMEN 2 VIEW & CHEST 1 VIEW)

[w chest pa]
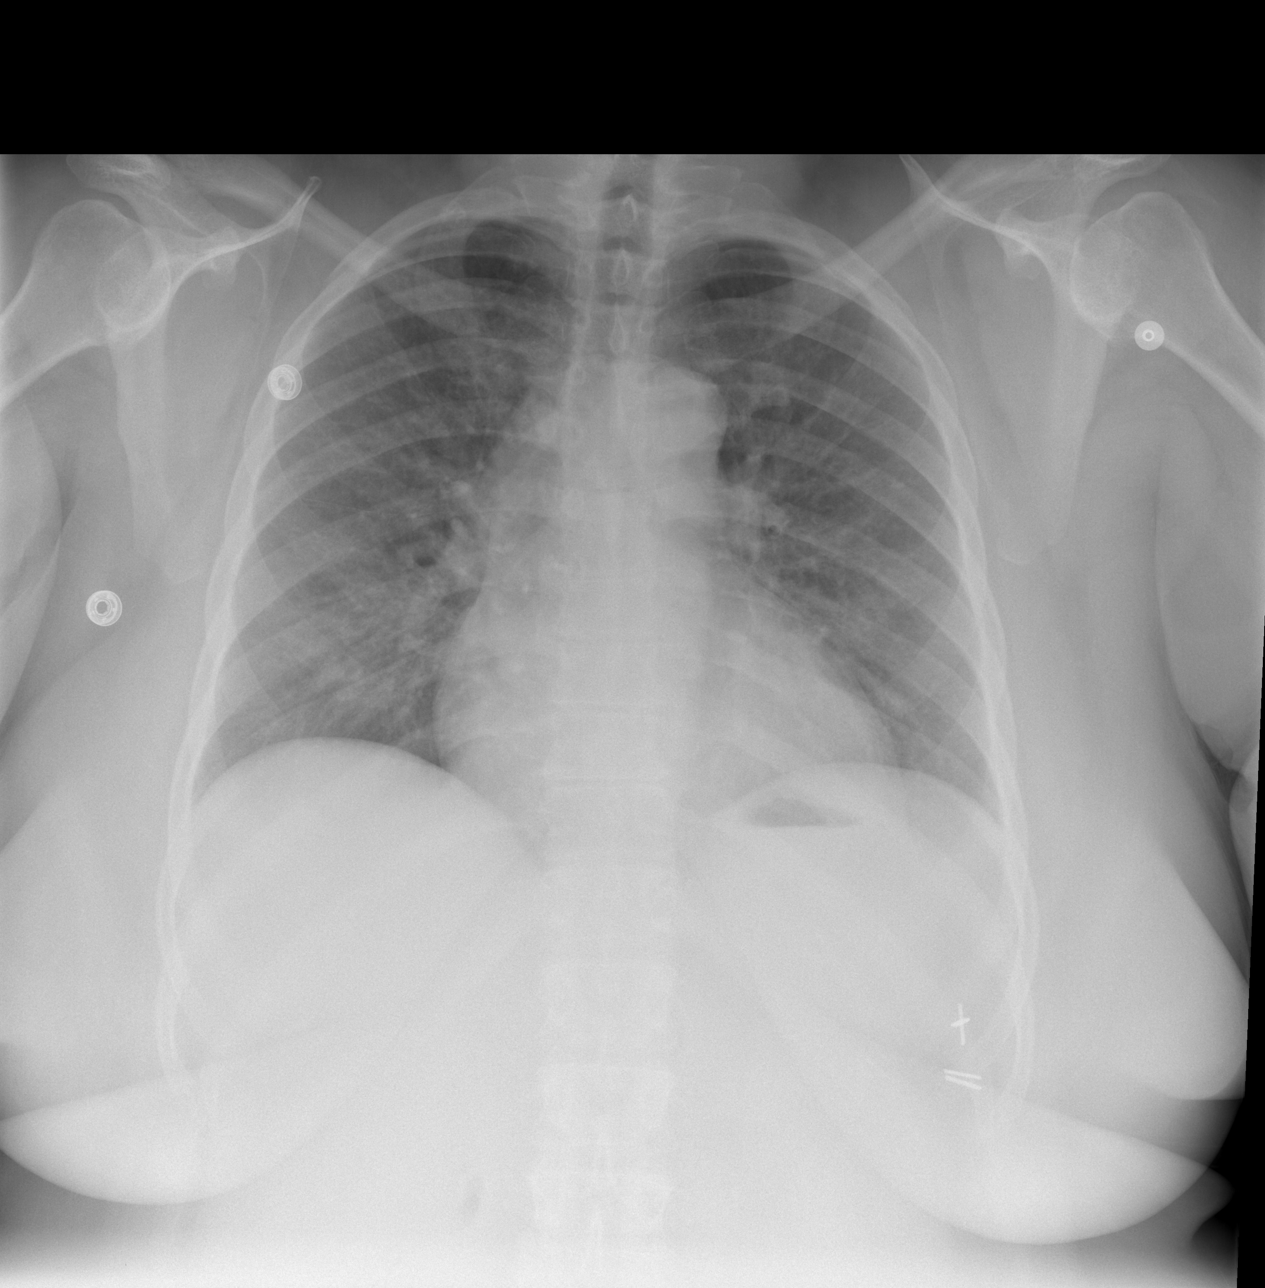

[w abdomen upright *]
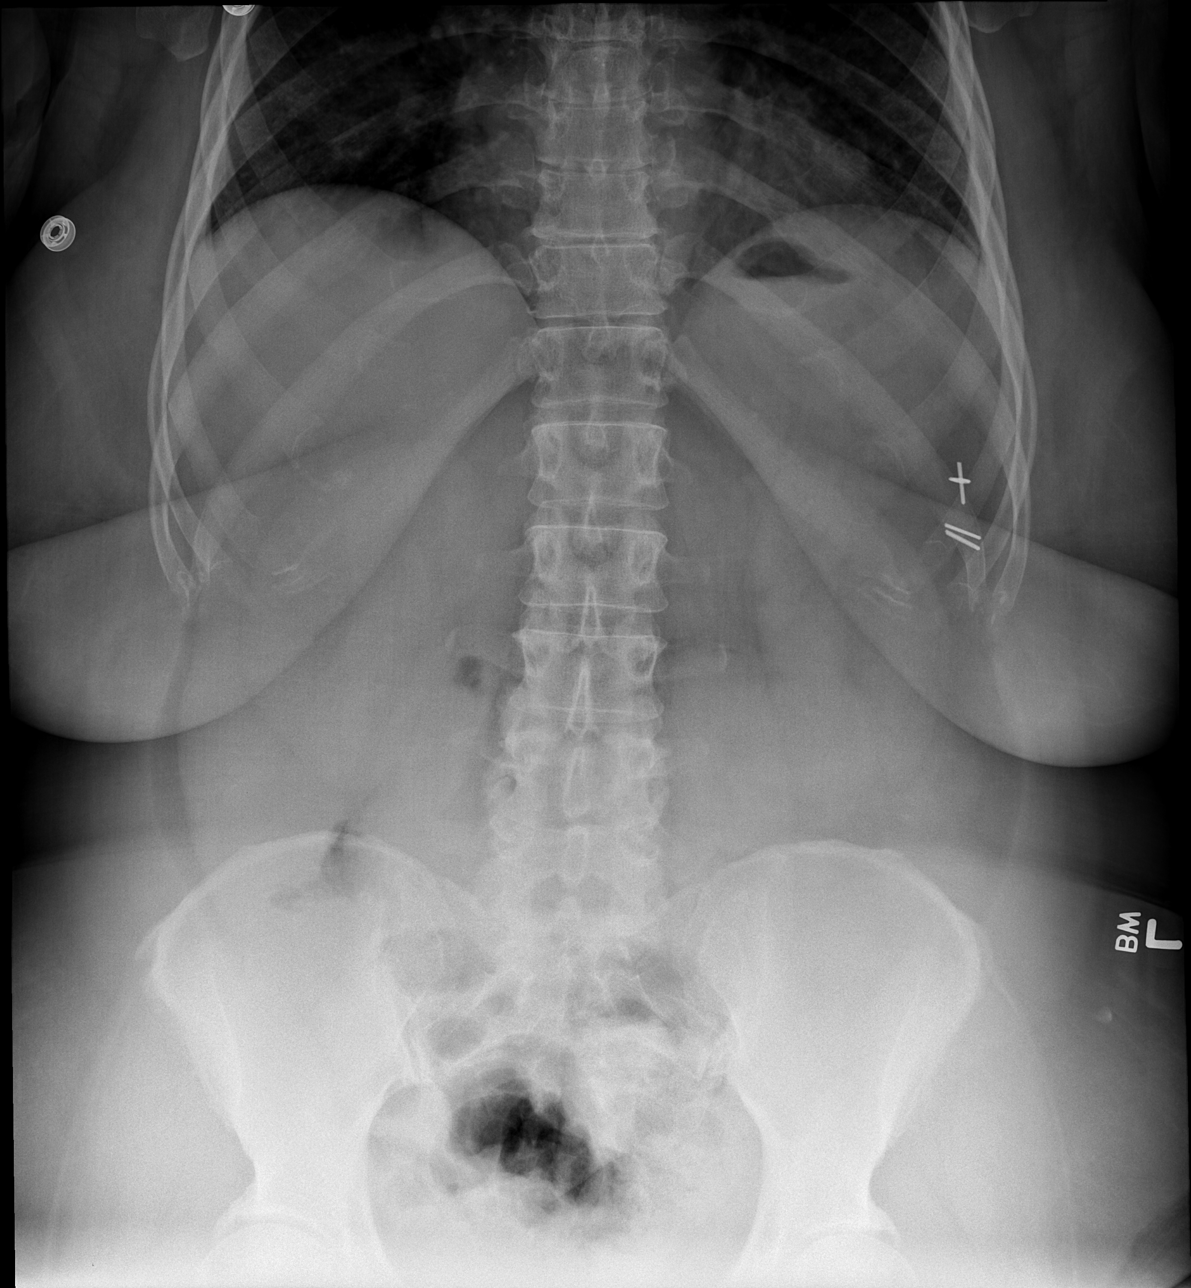

[t abdomen supine *]
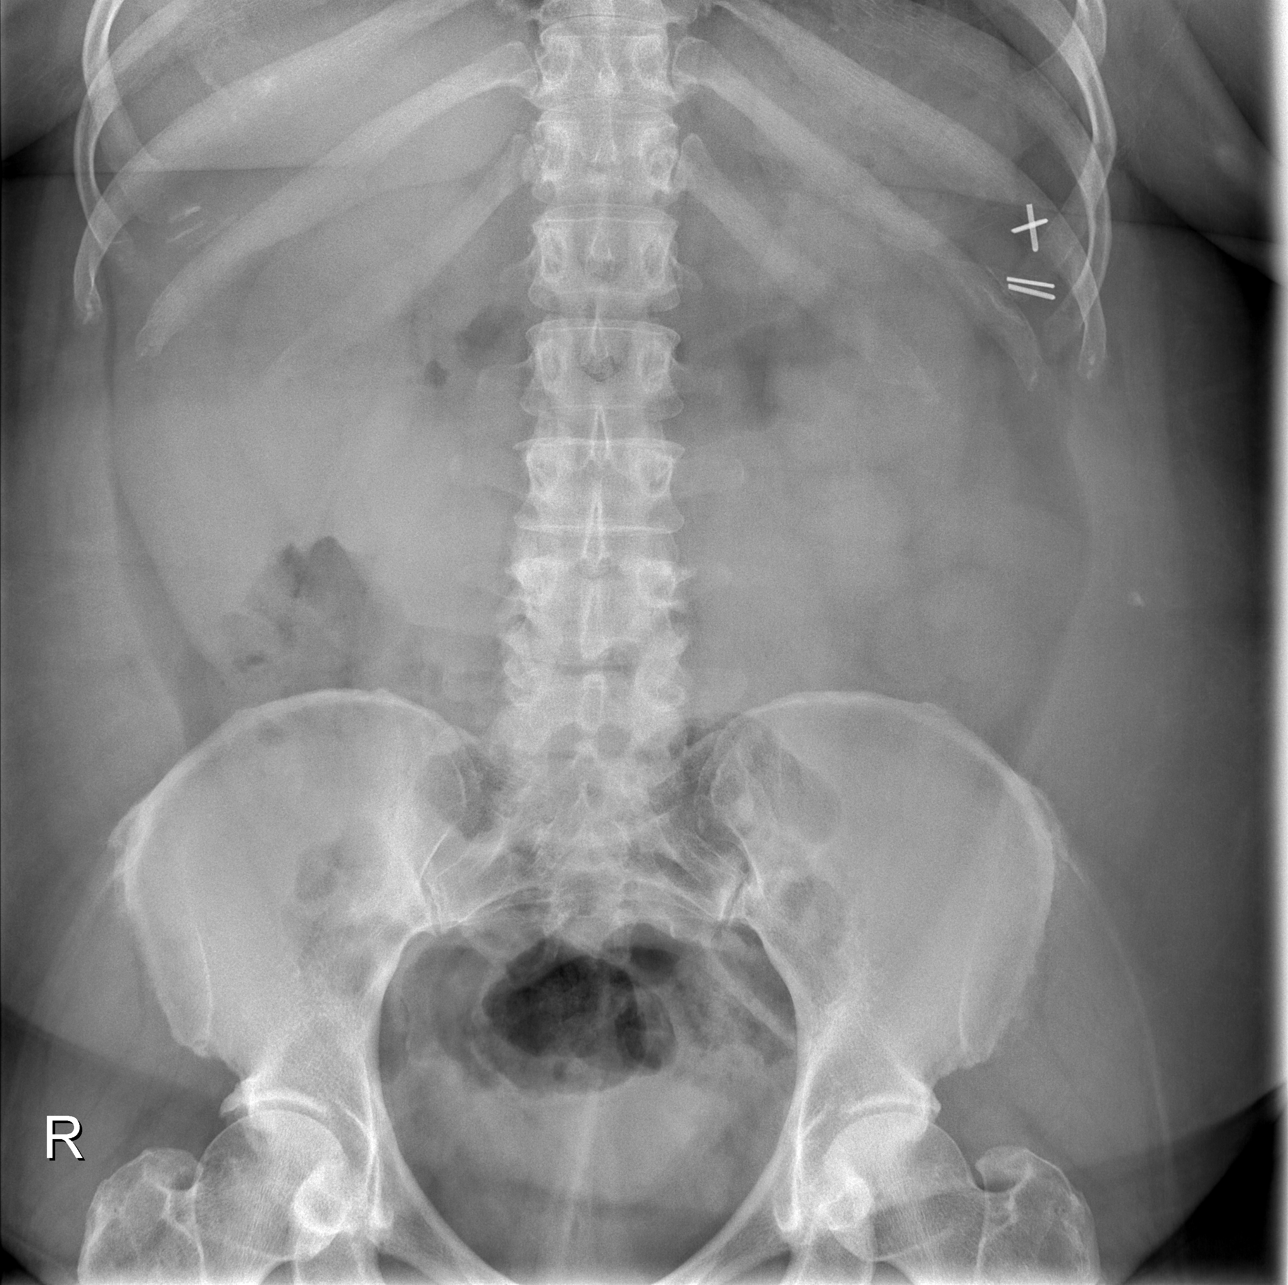

[3 of 3 positions shown; findings below may reference images not displayed]

FINDINGS: Borderline enlarged cardiac silhouette.  Mild diffuse
peribronchial thickening.  Mildly prominent pulmonary vasculature
and interstitial markings.  These are both accentuated by a
decreased inspiration.  Normal bowel gas pattern without free
peritoneal air.  Surgical clips overlying the left upper abdomen,
unchanged.  Minimal scoliosis.
IMPRESSION: 1.  Poor inspiration with accentuation of the heart, pulmonary
vasculature and interstitial markings.
2.  Stable mild chronic bronchitic changes.
3.  No acute abdominal abnormality.

## 2010-02-12 ENCOUNTER — Emergency Department (HOSPITAL_COMMUNITY): Admission: EM | Admit: 2010-02-12 | Discharge: 2010-02-12 | Payer: Self-pay | Admitting: Emergency Medicine

## 2010-03-10 IMAGING — CR DG CHEST 2V
2 series · 2 of 2 positions shown · non-contrast
Comparison: 04/13/2008

CLINICAL DATA: Shortness of breath

CHEST - 2 VIEW

[w chest pa *]
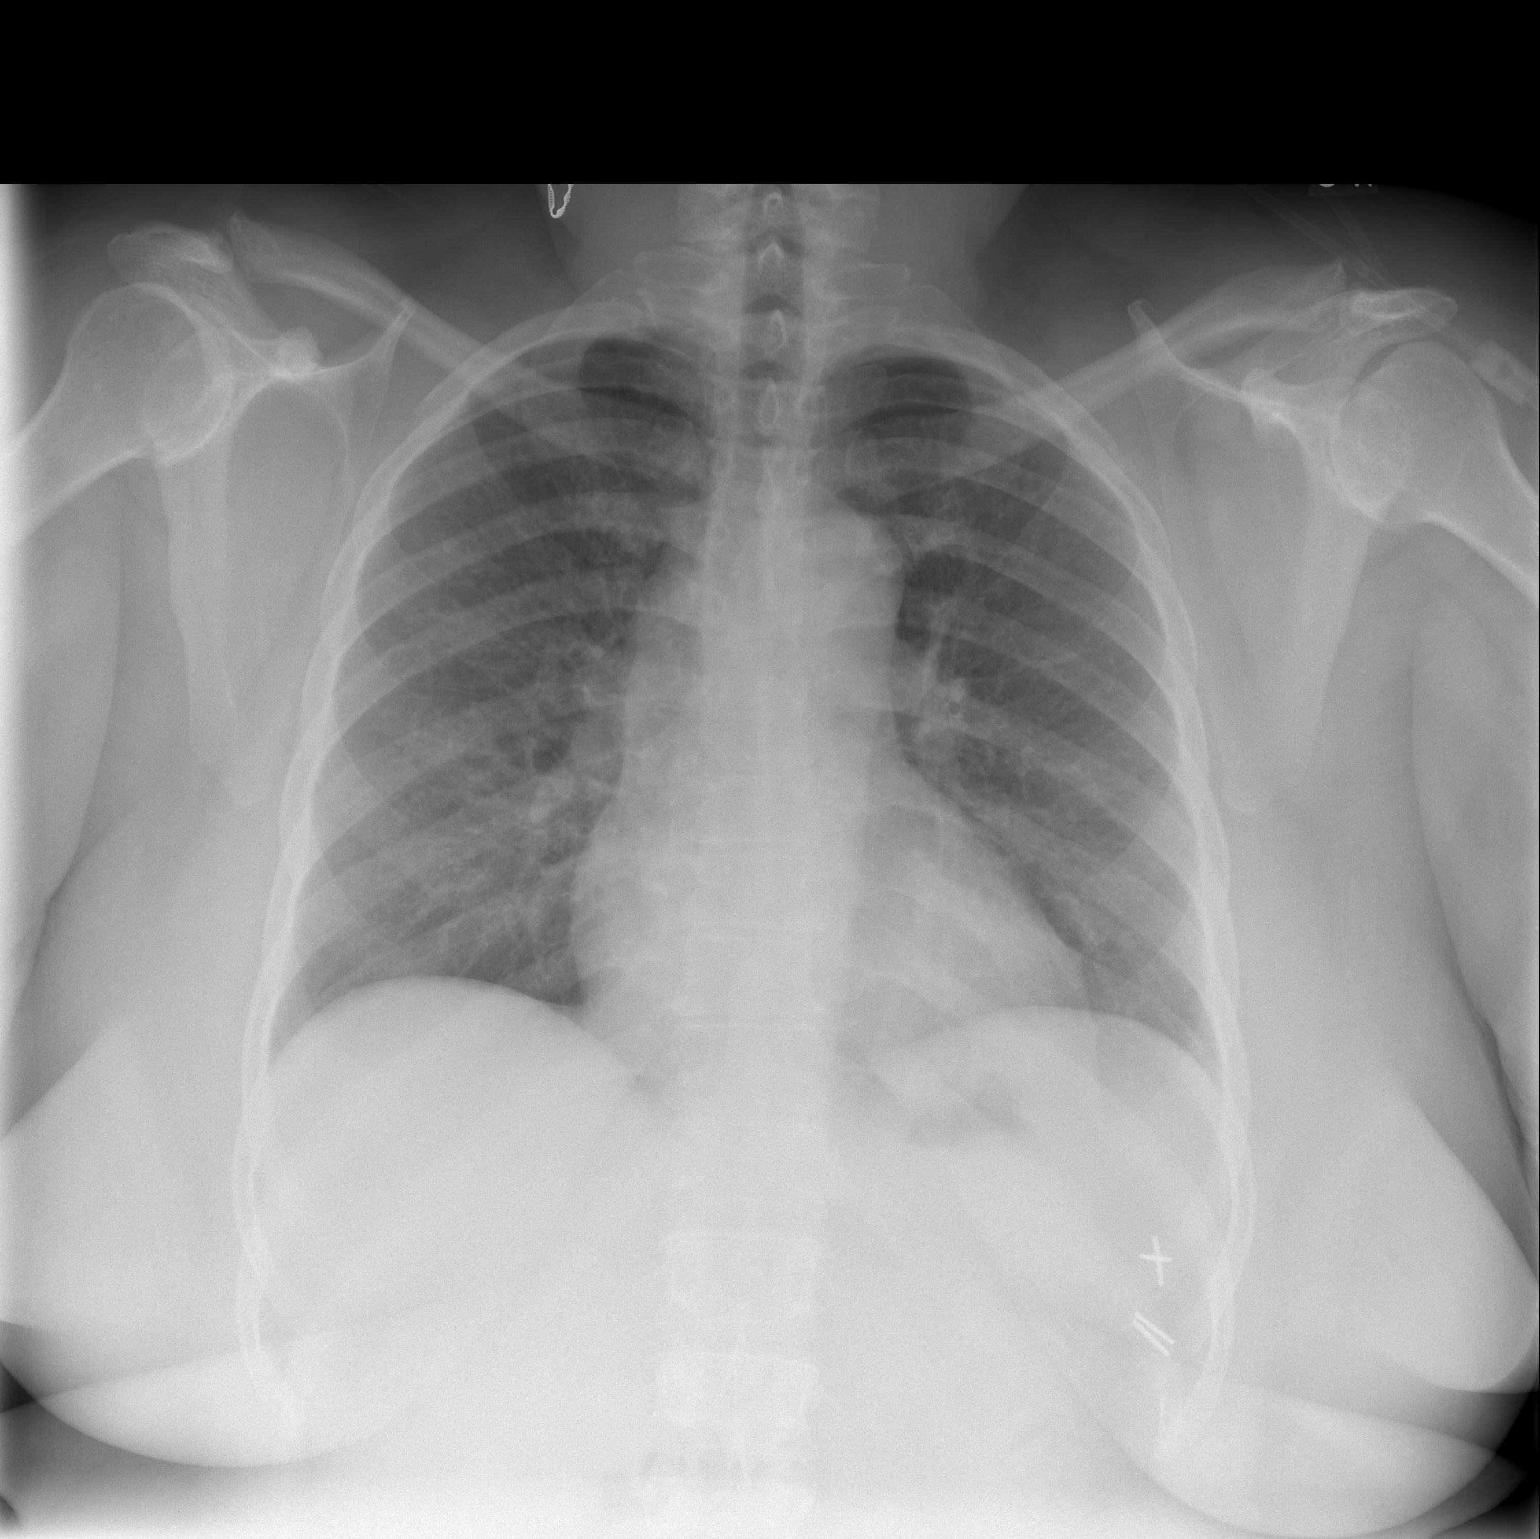

[w chest lat *]
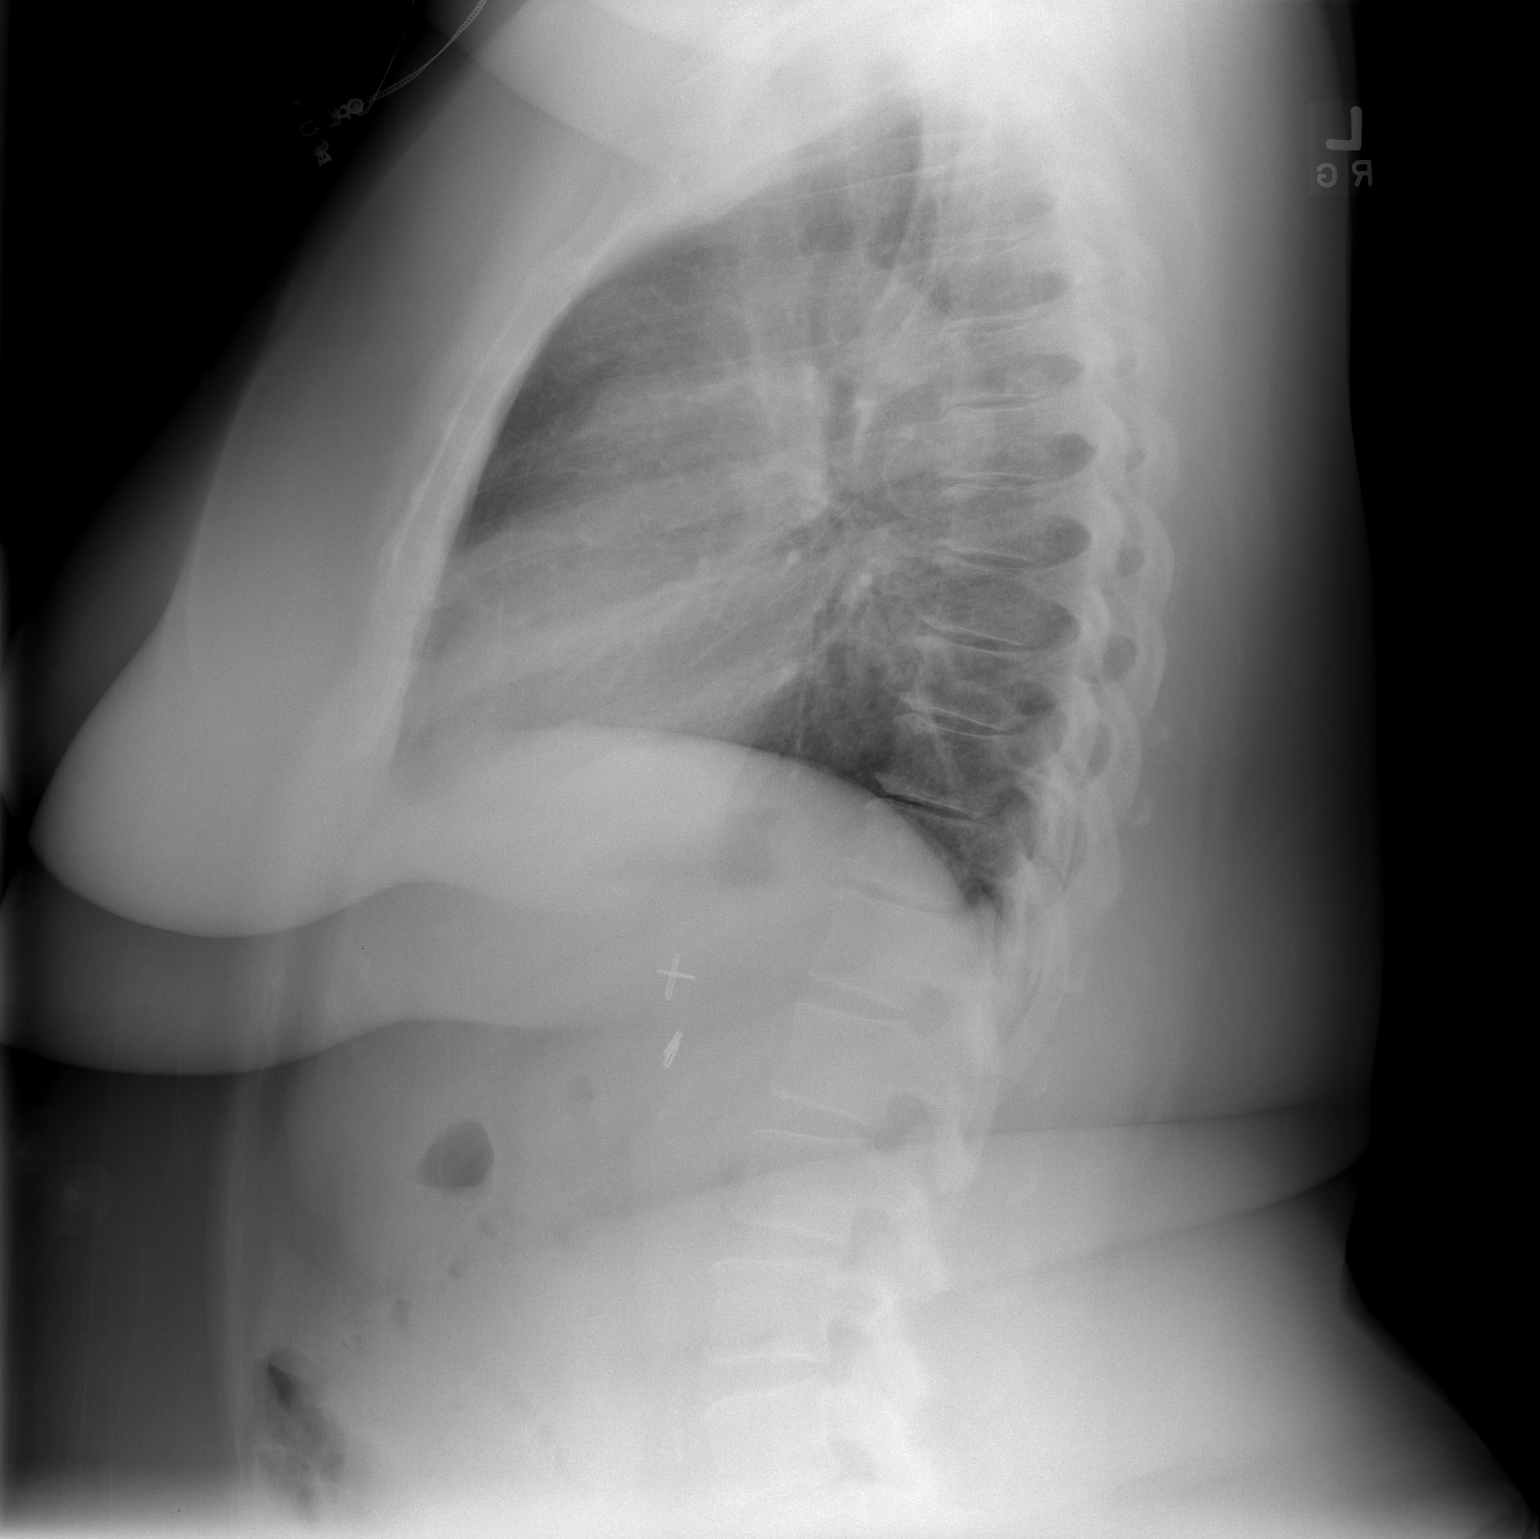

[2 of 2 positions shown; findings below may reference images not displayed]

FINDINGS: The cardiac silhouette, mediastinal and hilar contours
are within normal limits and stable.  There are chronic bronchitic
type changes which may be related to smoking or reactive airways
disease.  No acute overlying pulmonary process.
IMPRESSION: 1.  Chronic bronchitic type lung changes, likely related to
smoking.
2.  No acute overlying pulmonary process.

## 2010-04-06 IMAGING — CR DG CHEST 2V
2 series · 2 of 2 positions shown · non-contrast
Comparison: 06/29/2008

CLINICAL DATA: Chest pain and discomfort.

CHEST - 2 VIEW

[w chest pa]
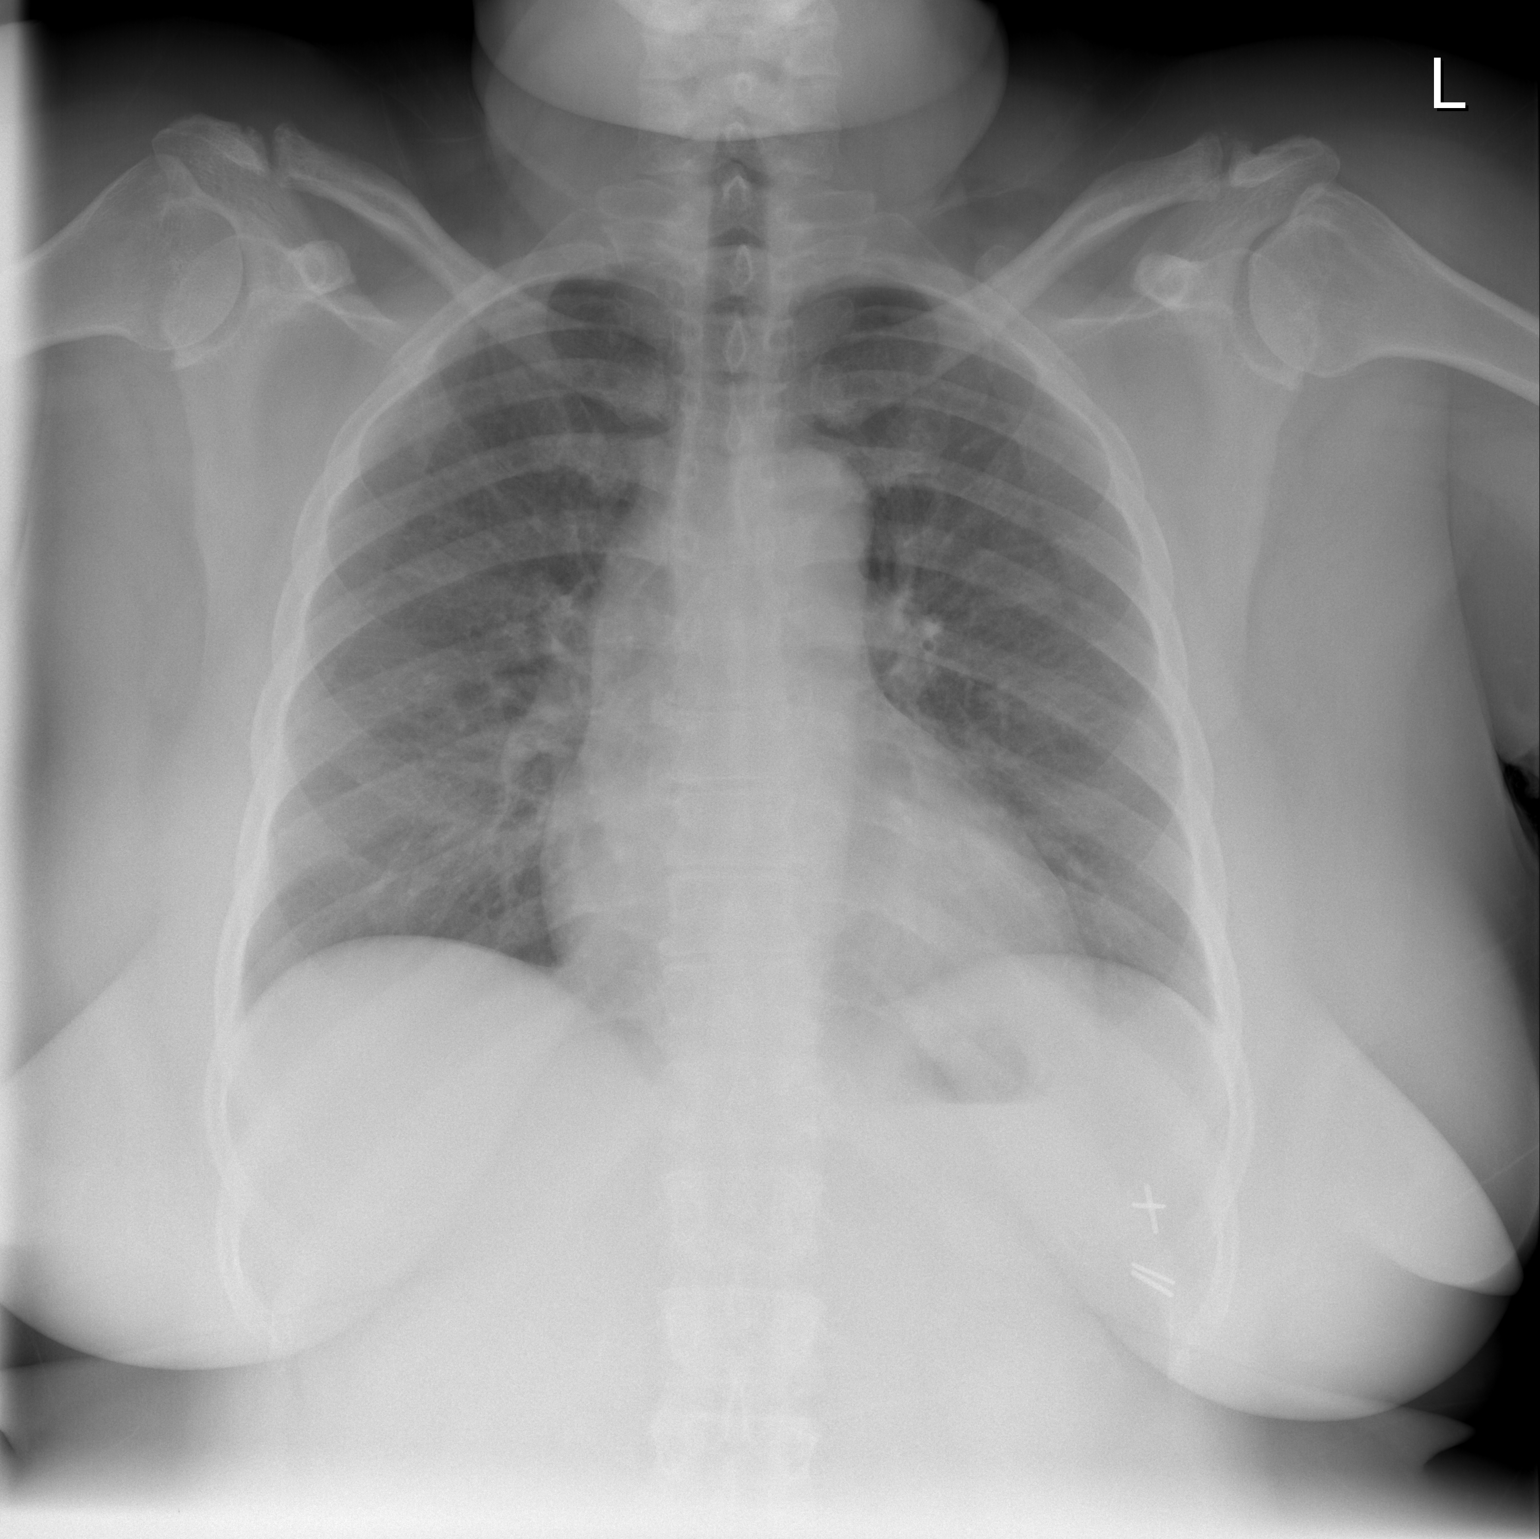

[w chest lat *]
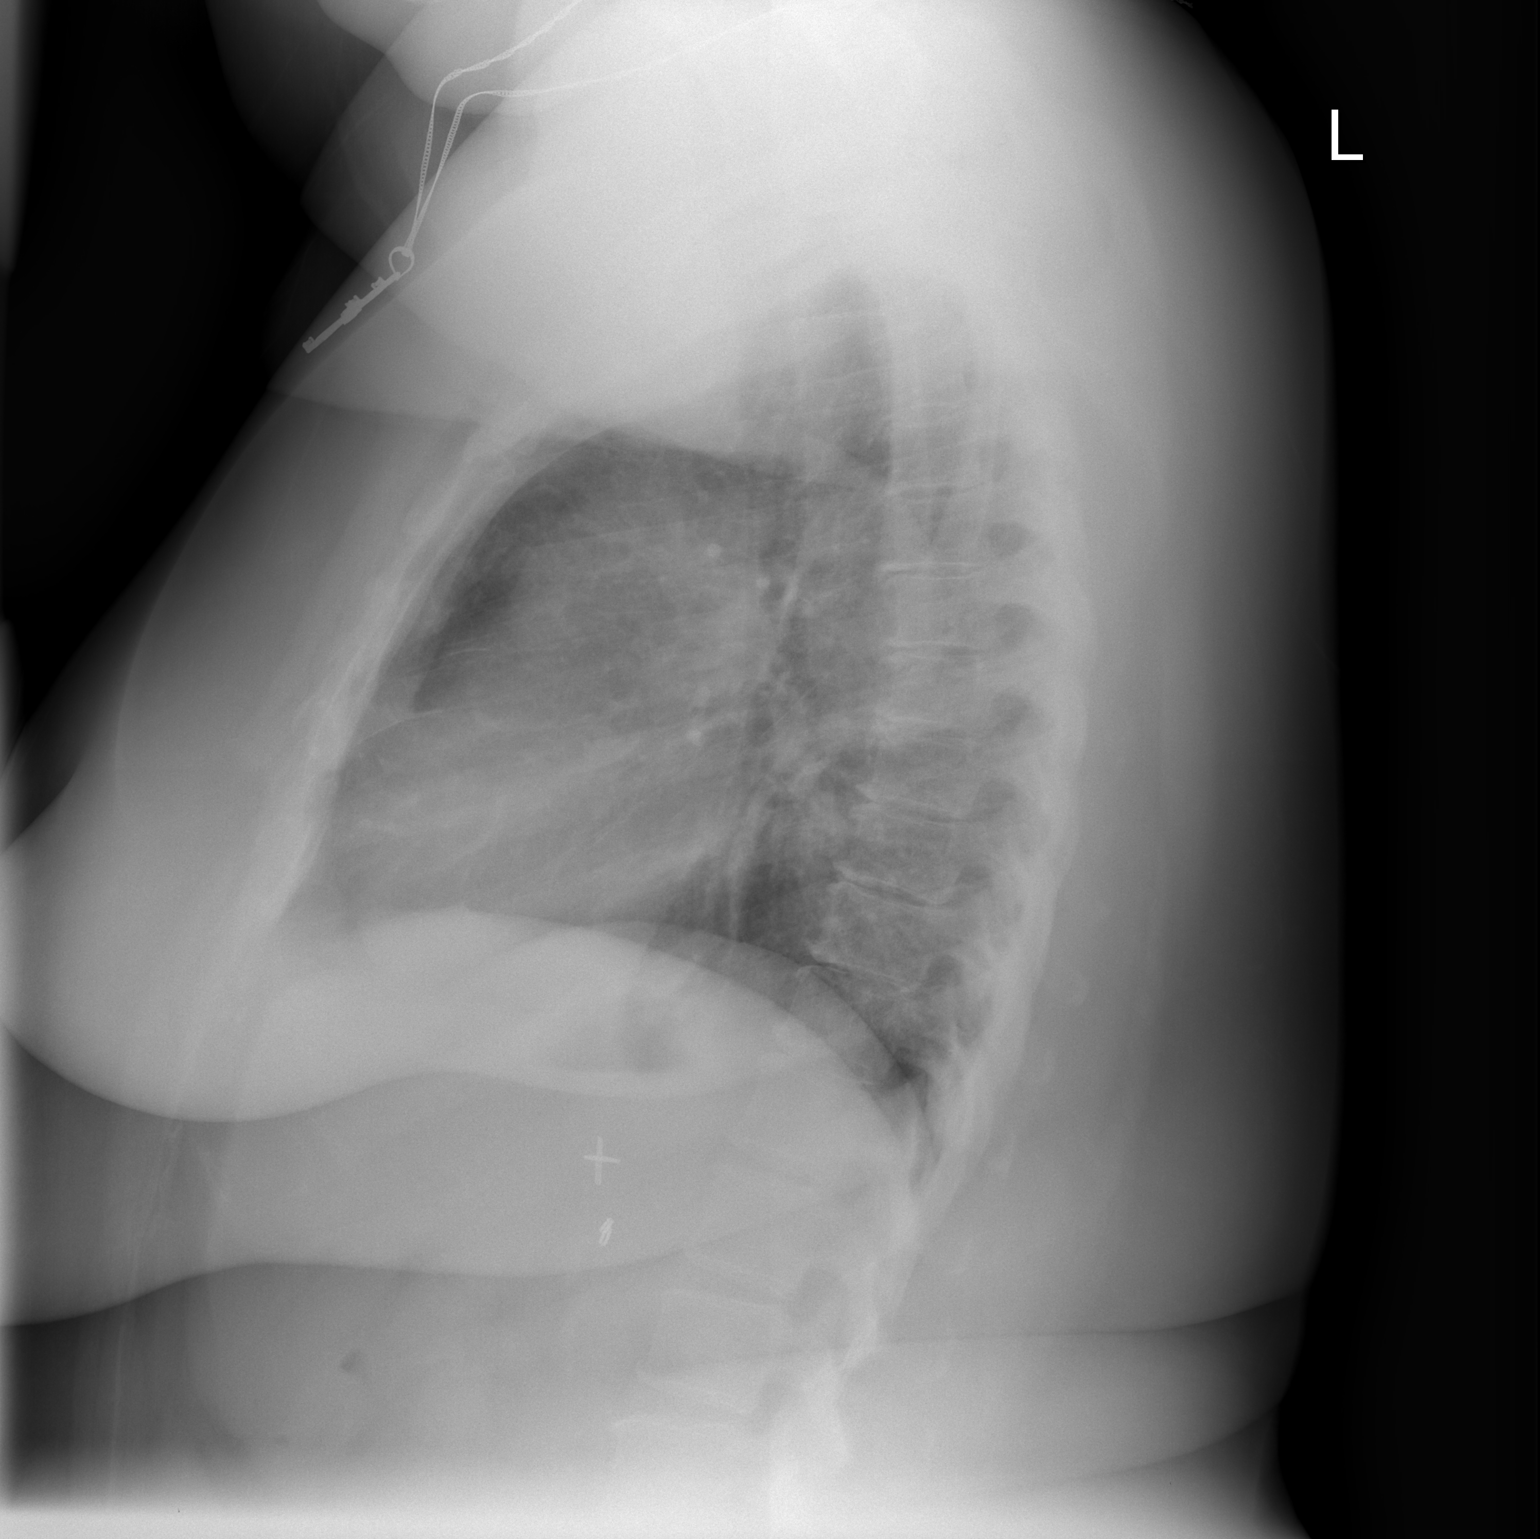

[2 of 2 positions shown; findings below may reference images not displayed]

FINDINGS: The heart size and mediastinal contours are within normal
limits.  Both lungs are clear.  The visualized skeletal structures
are unremarkable.
IMPRESSION: No active cardiopulmonary disease.

## 2010-04-06 IMAGING — CT CT HEAD W/O CM
1 series · 16 of 30 positions shown, 20 images · non-contrast
Comparison: None

CLINICAL DATA: Headaches.  Inner ear pain.

CT HEAD WITHOUT CONTRAST
TECHNIQUE: Contiguous axial images were obtained from the base of
the skull through the vertex without contrast

[Series 2: headseq 4.8 h45s · axial · 0.43mm/px · z∈[+1092,+1221]mm · 16 of 30 slices shown, 20 images]
[im 2/30  brain]
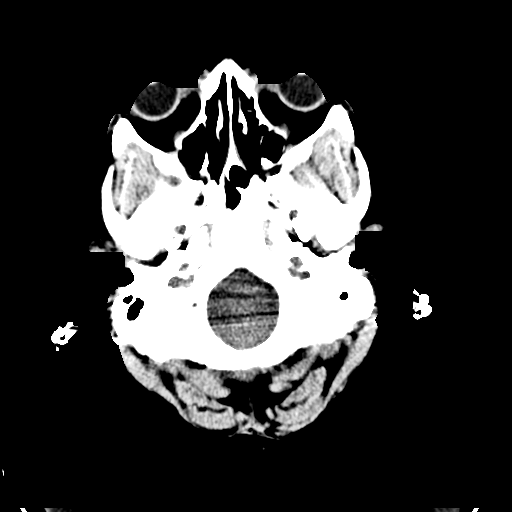
[im 2/30  bone]
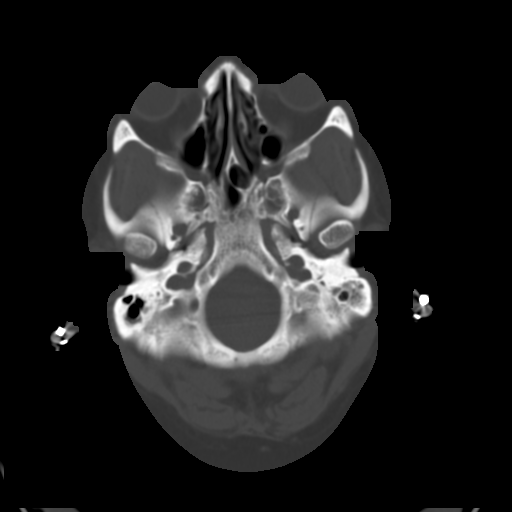
[im 4/30  brain]
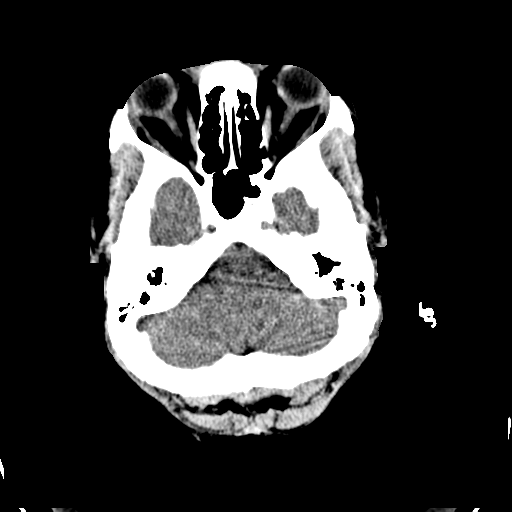
[im 6/30  brain]
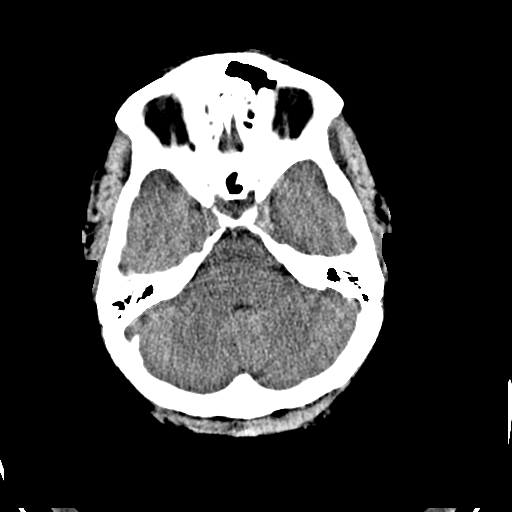
[im 8/30  brain]
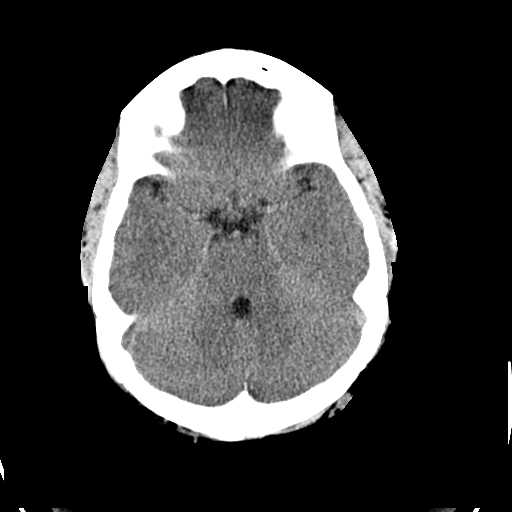
[im 9/30  brain]
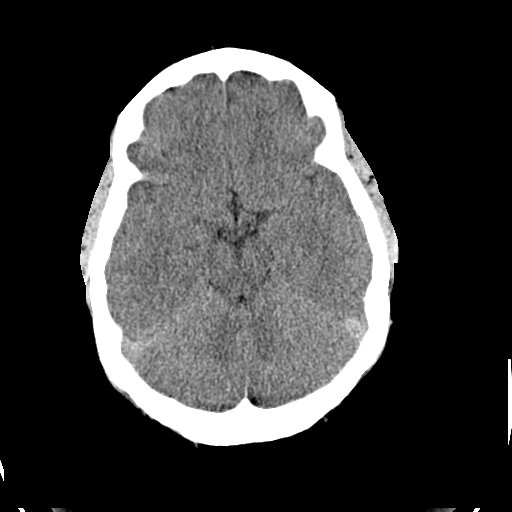
[im 9/30  bone]
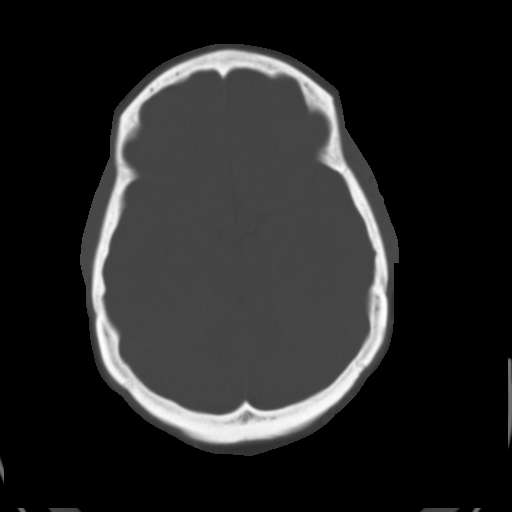
[im 11/30  brain]
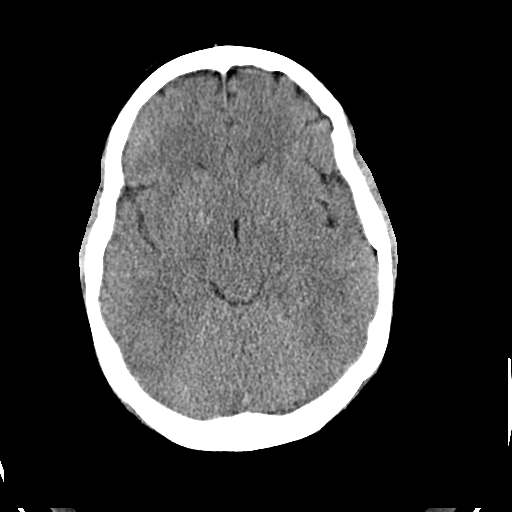
[im 13/30  brain]
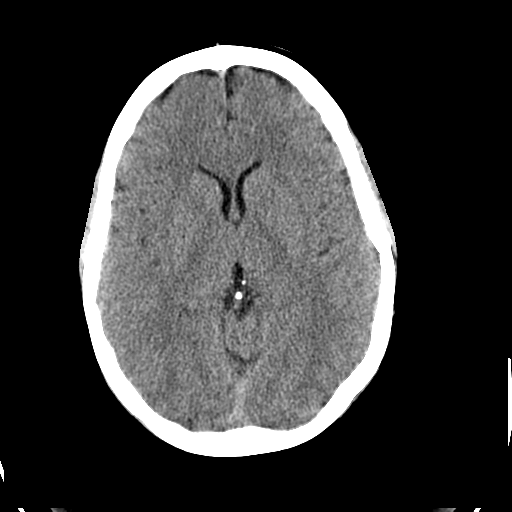
[im 15/30  brain]
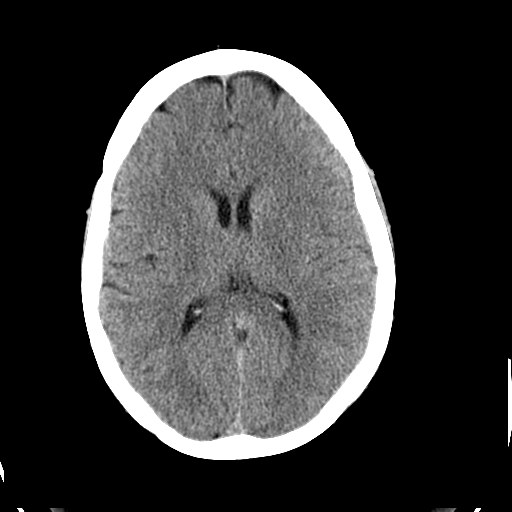
[im 16/30  brain]
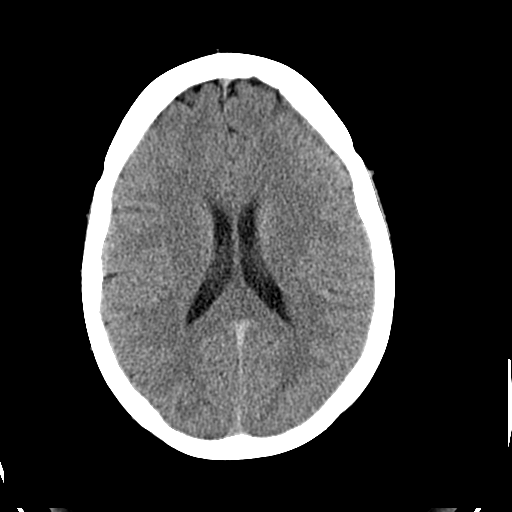
[im 16/30  bone]
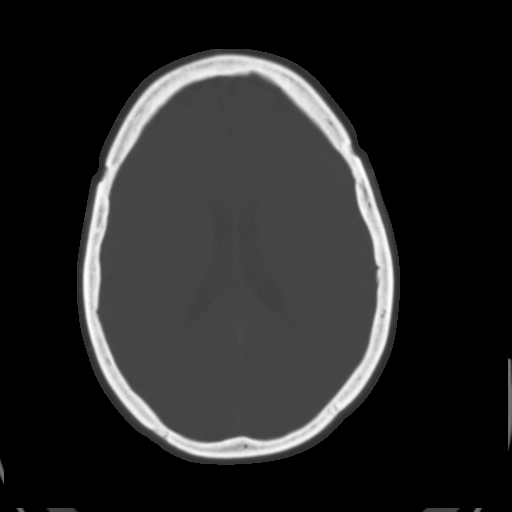
[im 18/30  brain]
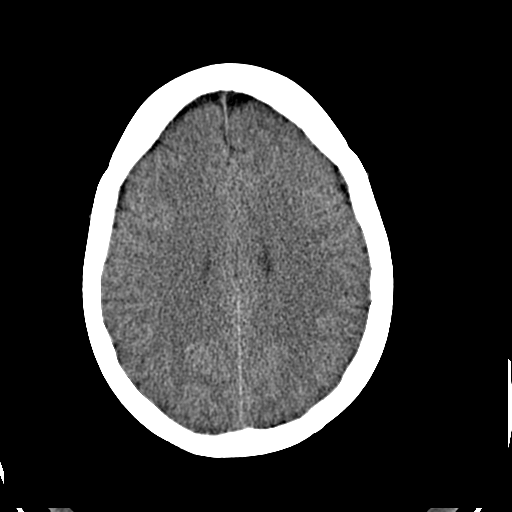
[im 20/30  brain]
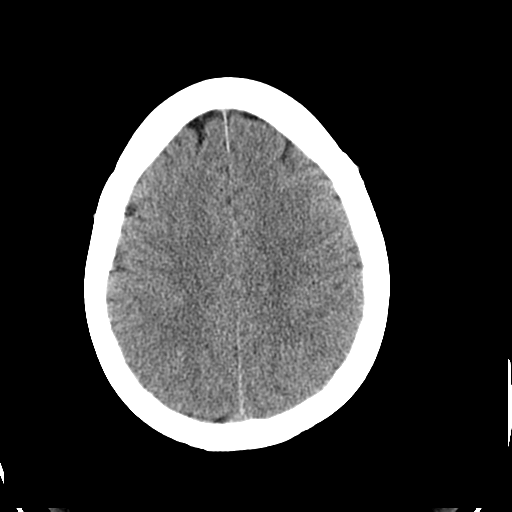
[im 22/30  brain]
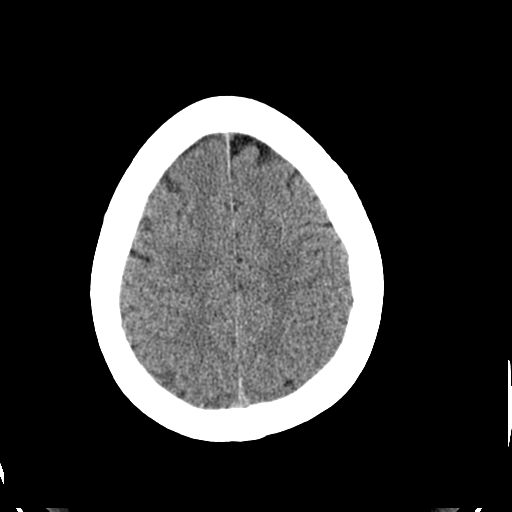
[im 23/30  brain]
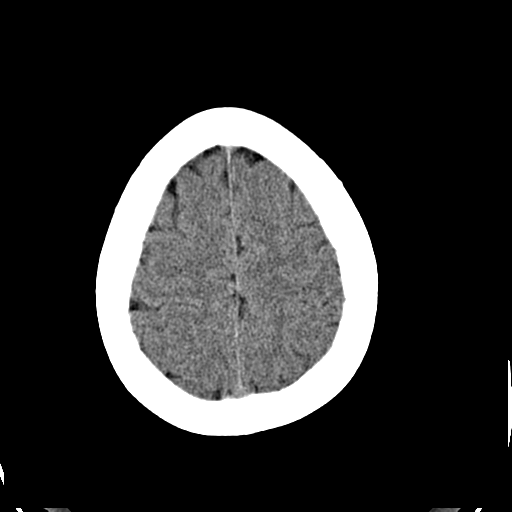
[im 23/30  bone]
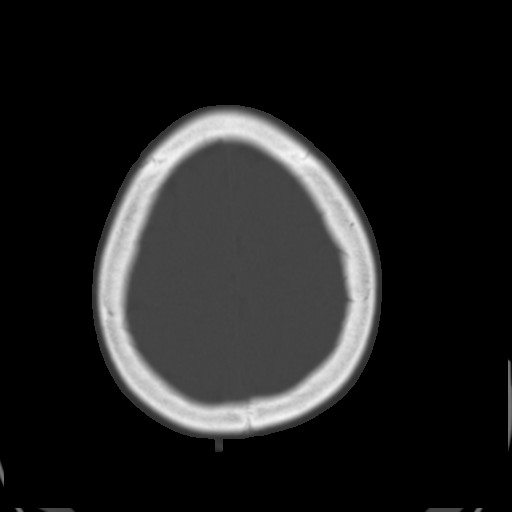
[im 25/30  brain]
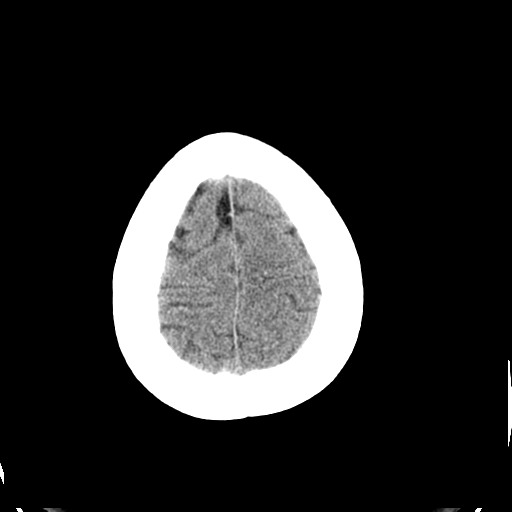
[im 27/30  brain]
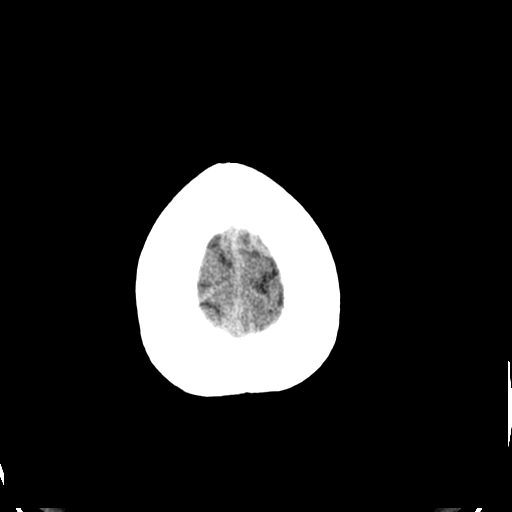
[im 29/30  brain]
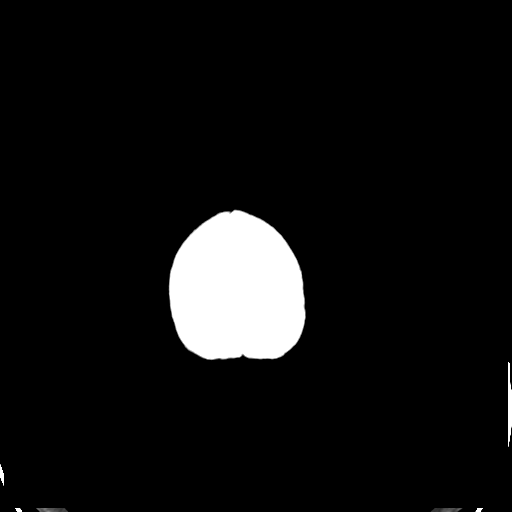

[16 of 30 positions shown; findings below may reference images not displayed]

FINDINGS: There is no evidence of intracranial hemorrhage, brain
edema, or other signs of acute infarction.  There is no evidence of
intracranial mass lesions, or mass effect.  No abnormal extraaxial
fluid collections are identified.  There is no evidence of
hydrocephalus, or other significant intracranial abnormality.  No
skull abnormality identified.
IMPRESSION: Negative non-contrast head CT.

## 2010-06-29 ENCOUNTER — Encounter: Payer: Self-pay | Admitting: Emergency Medicine

## 2010-06-30 ENCOUNTER — Encounter: Payer: Self-pay | Admitting: Obstetrics

## 2010-06-30 ENCOUNTER — Encounter: Payer: Self-pay | Admitting: Otolaryngology

## 2010-07-03 ENCOUNTER — Emergency Department (HOSPITAL_COMMUNITY)
Admission: EM | Admit: 2010-07-03 | Discharge: 2010-07-04 | Payer: Self-pay | Source: Home / Self Care | Admitting: Emergency Medicine

## 2010-07-26 ENCOUNTER — Emergency Department (HOSPITAL_COMMUNITY)
Admission: EM | Admit: 2010-07-26 | Discharge: 2010-07-26 | Disposition: A | Discharge status: Home / Self Care | Payer: Medicaid Other | Attending: Emergency Medicine | Admitting: Emergency Medicine

## 2010-07-26 ENCOUNTER — Emergency Department (HOSPITAL_COMMUNITY): Payer: Medicaid Other

## 2010-07-26 DIAGNOSIS — Z85038 Personal history of other malignant neoplasm of large intestine: Secondary | ICD-10-CM | POA: Insufficient documentation

## 2010-07-26 DIAGNOSIS — R0989 Other specified symptoms and signs involving the circulatory and respiratory systems: Secondary | ICD-10-CM | POA: Insufficient documentation

## 2010-07-26 DIAGNOSIS — R059 Cough, unspecified: Secondary | ICD-10-CM | POA: Insufficient documentation

## 2010-07-26 DIAGNOSIS — M129 Arthropathy, unspecified: Secondary | ICD-10-CM | POA: Insufficient documentation

## 2010-07-26 DIAGNOSIS — R0609 Other forms of dyspnea: Secondary | ICD-10-CM | POA: Insufficient documentation

## 2010-07-26 DIAGNOSIS — R05 Cough: Secondary | ICD-10-CM | POA: Insufficient documentation

## 2010-07-26 DIAGNOSIS — J45909 Unspecified asthma, uncomplicated: Secondary | ICD-10-CM | POA: Insufficient documentation

## 2010-07-26 DIAGNOSIS — J40 Bronchitis, not specified as acute or chronic: Secondary | ICD-10-CM | POA: Insufficient documentation

## 2010-07-26 DIAGNOSIS — G8929 Other chronic pain: Secondary | ICD-10-CM | POA: Insufficient documentation

## 2010-07-26 DIAGNOSIS — I10 Essential (primary) hypertension: Secondary | ICD-10-CM | POA: Insufficient documentation

## 2010-07-26 DIAGNOSIS — R0602 Shortness of breath: Secondary | ICD-10-CM | POA: Insufficient documentation

## 2010-07-26 DIAGNOSIS — M549 Dorsalgia, unspecified: Secondary | ICD-10-CM | POA: Insufficient documentation

## 2010-07-26 LAB — URINALYSIS, ROUTINE W REFLEX MICROSCOPIC
Hgb urine dipstick: NEGATIVE
Protein, ur: NEGATIVE mg/dL
Urine Glucose, Fasting: NEGATIVE mg/dL
pH: 6 (ref 5.0–8.0)

## 2010-07-26 LAB — BASIC METABOLIC PANEL
CO2: 29 mEq/L (ref 19–32)
Chloride: 104 mEq/L (ref 96–112)
Creatinine, Ser: 0.89 mg/dL (ref 0.4–1.2)
GFR calc Af Amer: >60 mL/min (ref >60)
Potassium: 3.9 mEq/L (ref 3.5–5.1)

## 2010-07-26 LAB — DIFFERENTIAL
Lymphs Abs: 2.5 10*3/uL (ref 0.7–4.0)
Monocytes Relative: 8 % (ref 3–12)
Neutro Abs: 3.3 10*3/uL (ref 1.7–7.7)
Neutrophils Relative %: 50 % (ref 43–77)

## 2010-07-26 LAB — CBC
Hemoglobin: 13.2 g/dL (ref 12.0–15.0)
MCH: 32.3 pg (ref 26.0–34.0)
MCV: 100.7 fL — ABNORMAL HIGH (ref 78.0–100.0)
RBC: 4.09 MIL/uL (ref 3.87–5.11)
WBC: 6.5 10*3/uL (ref 4.0–10.5)

## 2010-07-31 IMAGING — CR DG CHEST 1V PORT
1 series · 1 of 1 positions shown · non-contrast
Comparison: 07/26/2008.

CLINICAL DATA: Shortness of breath.  Difficulty breathing.

PORTABLE CHEST - 1 VIEW

[view not recorded]
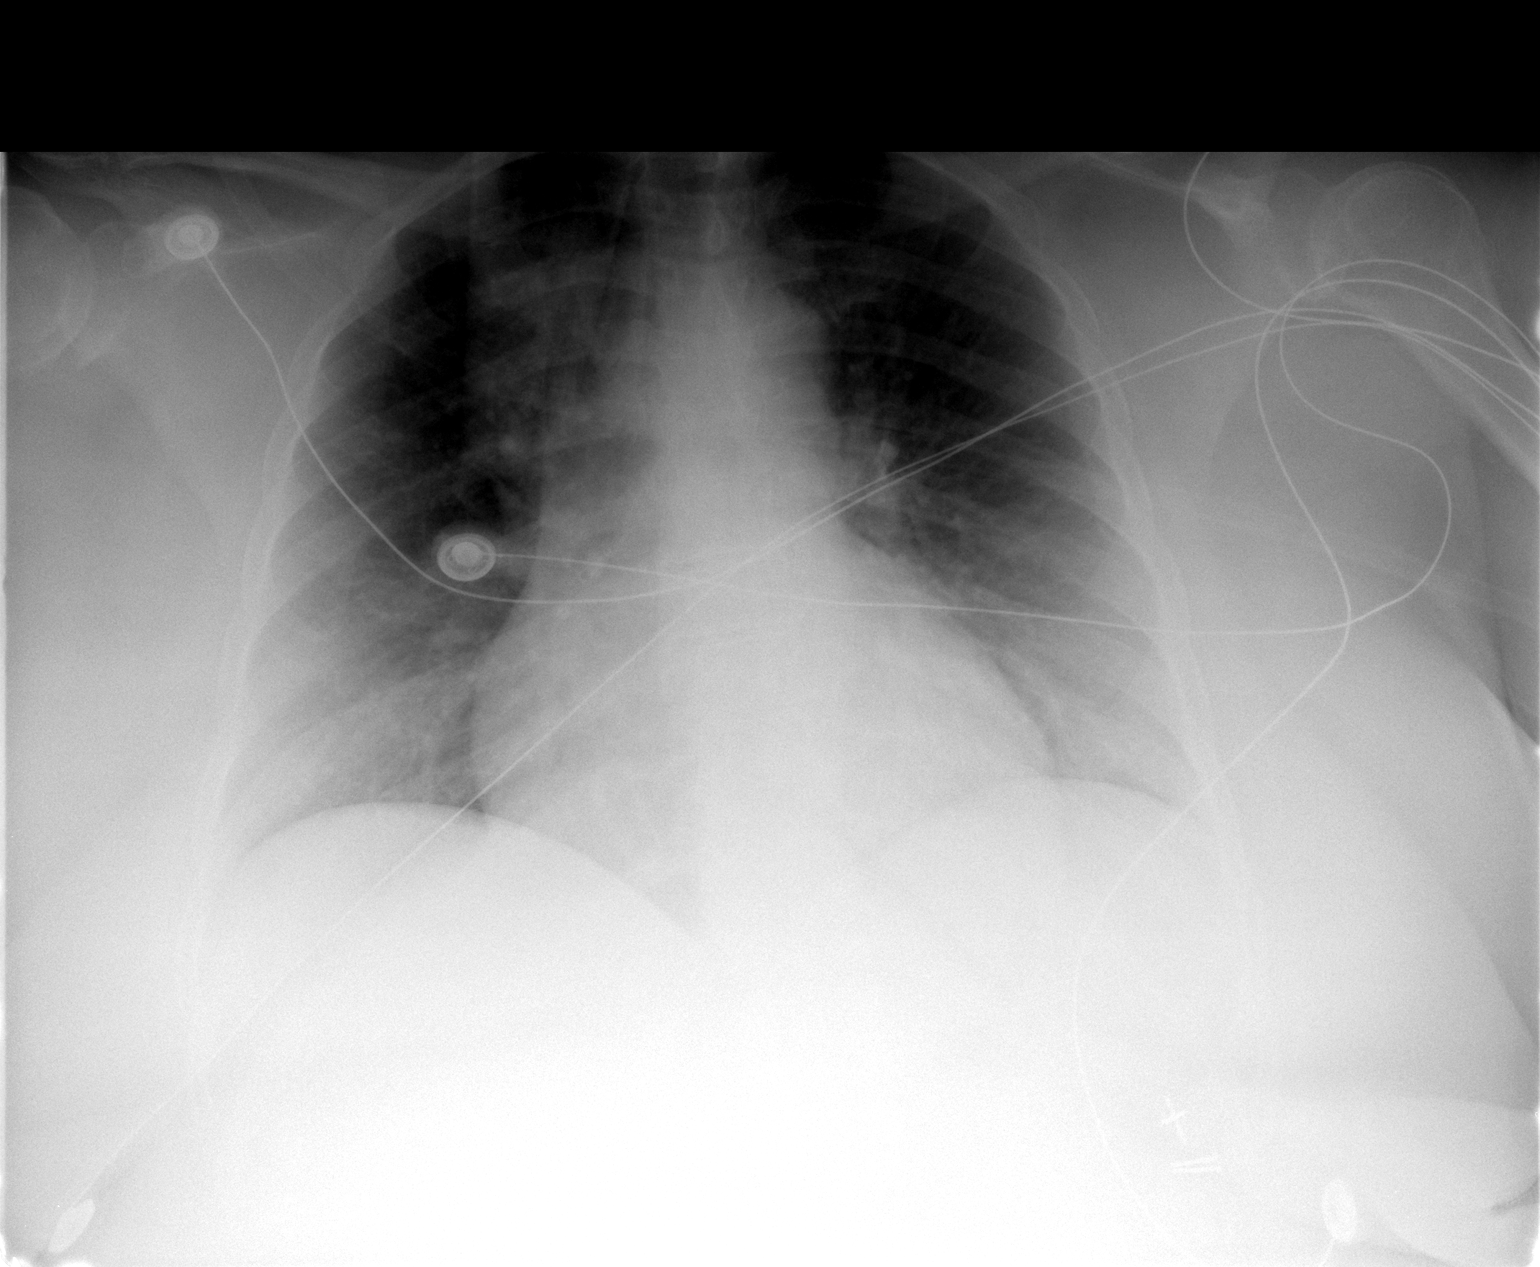

[1 of 1 positions shown; findings below may reference images not displayed]

FINDINGS: 9987 hours. The cardiopericardial silhouette is enlarged.
Lung volumes are low.  No focal consolidation, pulmonary edema, or
pleural effusion. Telemetry leads overlie the chest.
IMPRESSION: Cardiomegaly with low lung volumes.  No acute cardiopulmonary
process.

## 2010-08-22 LAB — CBC
HCT: 44.3 % (ref 36.0–46.0)
Hemoglobin: 14.7 g/dL (ref 12.0–15.0)
MCV: 98.3 fL (ref 78.0–100.0)
RBC: 4.51 MIL/uL (ref 3.87–5.11)
RDW: 14.5 % (ref 11.5–15.5)
WBC: 7.1 10*3/uL (ref 4.0–10.5)

## 2010-08-22 LAB — POCT I-STAT, CHEM 8
BUN: 11 mg/dL (ref 6–23)
Calcium, Ion: 1.06 mmol/L — ABNORMAL LOW (ref 1.12–1.32)
Chloride: 106 mEq/L (ref 96–112)
Creatinine, Ser: 0.8 mg/dL (ref 0.4–1.2)
Glucose, Bld: 87 mg/dL (ref 70–99)
Potassium: 4.1 mEq/L (ref 3.5–5.1)

## 2010-08-22 LAB — DIFFERENTIAL
Basophils Relative: 0 % (ref 0–1)
Eosinophils Relative: 2 % (ref 0–5)
Lymphocytes Relative: 39 % (ref 12–46)
Lymphs Abs: 2.8 10*3/uL (ref 0.7–4.0)
Monocytes Relative: 7 % (ref 3–12)
Neutro Abs: 3.7 10*3/uL (ref 1.7–7.7)

## 2010-08-25 LAB — URINALYSIS, ROUTINE W REFLEX MICROSCOPIC
Bilirubin Urine: NEGATIVE
Glucose, UA: NEGATIVE mg/dL
Ketones, ur: NEGATIVE mg/dL
Nitrite: NEGATIVE
Specific Gravity, Urine: 1.023 (ref 1.005–1.030)
pH: 6 (ref 5.0–8.0)

## 2010-08-25 LAB — POCT I-STAT, CHEM 8
BUN: 6 mg/dL (ref 6–23)
Calcium, Ion: 1.07 mmol/L — ABNORMAL LOW (ref 1.12–1.32)
Chloride: 102 mEq/L (ref 96–112)
Glucose, Bld: 75 mg/dL (ref 70–99)
HCT: 45 % (ref 36.0–46.0)
Potassium: 3.7 mEq/L (ref 3.5–5.1)

## 2010-09-11 LAB — GLUCOSE, CAPILLARY
Glucose-Capillary: 111 mg/dL — ABNORMAL HIGH (ref 70–99)
Glucose-Capillary: 113 mg/dL — ABNORMAL HIGH (ref 70–99)
Glucose-Capillary: 124 mg/dL — ABNORMAL HIGH (ref 70–99)
Glucose-Capillary: 127 mg/dL — ABNORMAL HIGH (ref 70–99)
Glucose-Capillary: 133 mg/dL — ABNORMAL HIGH (ref 70–99)
Glucose-Capillary: 141 mg/dL — ABNORMAL HIGH (ref 70–99)
Glucose-Capillary: 149 mg/dL — ABNORMAL HIGH (ref 70–99)
Glucose-Capillary: 157 mg/dL — ABNORMAL HIGH (ref 70–99)

## 2010-09-11 LAB — BASIC METABOLIC PANEL
BUN: 8 mg/dL (ref 6–23)
CO2: 26 mEq/L (ref 19–32)
CO2: 30 mEq/L (ref 19–32)
Chloride: 99 mEq/L (ref 96–112)
Chloride: 99 mEq/L (ref 96–112)
Creatinine, Ser: 0.76 mg/dL (ref 0.4–1.2)
GFR calc Af Amer: >60 mL/min (ref >60)
GFR calc non Af Amer: >60 mL/min (ref >60)
Glucose, Bld: 213 mg/dL — ABNORMAL HIGH (ref 70–99)
Potassium: 3.1 mEq/L — ABNORMAL LOW (ref 3.5–5.1)
Potassium: 3.9 mEq/L (ref 3.5–5.1)
Sodium: 138 mEq/L (ref 135–145)

## 2010-09-11 LAB — CBC
HCT: 41.5 % (ref 36.0–46.0)
HCT: 45.4 % (ref 36.0–46.0)
Hemoglobin: 14 g/dL (ref 12.0–15.0)
MCHC: 33.5 g/dL (ref 30.0–36.0)
MCV: 99 fL (ref 78.0–100.0)
MCV: 99 fL (ref 78.0–100.0)
RBC: 4.59 MIL/uL (ref 3.87–5.11)
RDW: 14.2 % (ref 11.5–15.5)
WBC: 7 10*3/uL (ref 4.0–10.5)

## 2010-09-11 LAB — CULTURE, RESPIRATORY W GRAM STAIN: Culture: NORMAL

## 2010-09-11 LAB — DIFFERENTIAL
Basophils Relative: 0 % (ref 0–1)
Eosinophils Absolute: 0.2 10*3/uL (ref 0.0–0.7)
Eosinophils Relative: 3 % (ref 0–5)
Lymphs Abs: 2.6 10*3/uL (ref 0.7–4.0)
Monocytes Absolute: 0.4 10*3/uL (ref 0.1–1.0)
Monocytes Relative: 5 % (ref 3–12)

## 2010-09-11 LAB — HEPATIC FUNCTION PANEL
ALT: 20 U/L (ref 0–35)
AST: 27 U/L (ref 0–37)
Albumin: 3.2 g/dL — ABNORMAL LOW (ref 3.5–5.2)
Alkaline Phosphatase: 77 U/L (ref 39–117)
Total Bilirubin: 0.4 mg/dL (ref 0.3–1.2)
Total Protein: 6.7 g/dL (ref 6.0–8.3)

## 2010-09-11 LAB — HEMOGLOBIN A1C: Hgb A1c MFr Bld: 5.7 % (ref 4.6–6.1)

## 2010-09-12 IMAGING — MG MM SCREEN MAMMOGRAM BILATERAL
4 series · 4 of 4 positions shown · non-contrast
Comparison: none

DG SCREEN MAMMOGRAM BILATERAL
Bilateral CC and MLO view(s) were taken.

DIGITAL SCREENING MAMMOGRAM WITH CAD:
The breast tissue is almost entirely fatty.  No masses or malignant type calcifications are 
identified.  Compared with prior studies.
Images were processed with CAD.

[R CC]
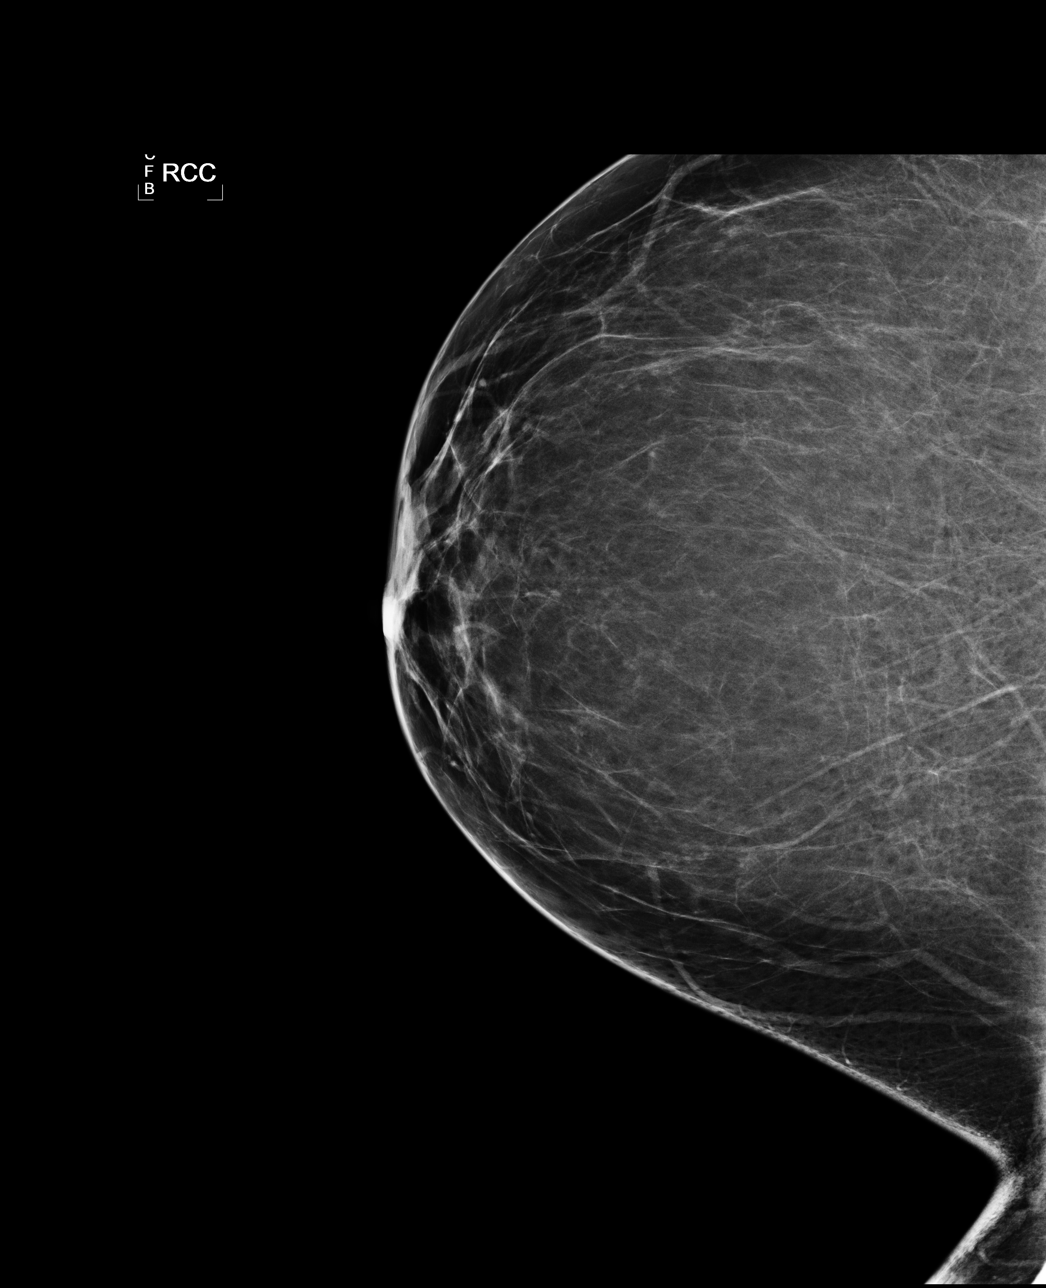

[L CC]
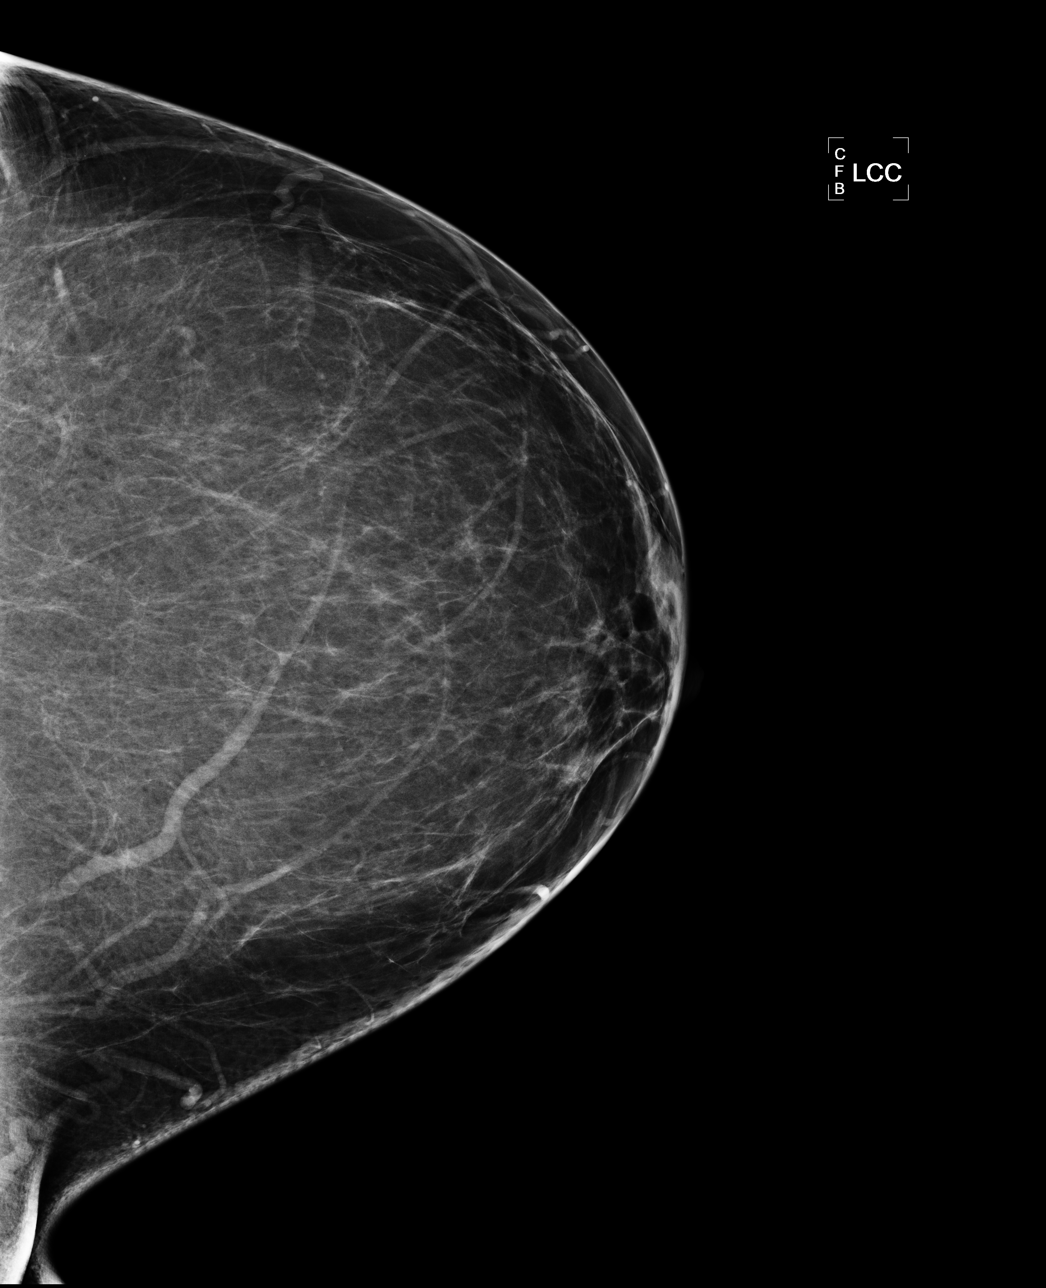

[L MLO]
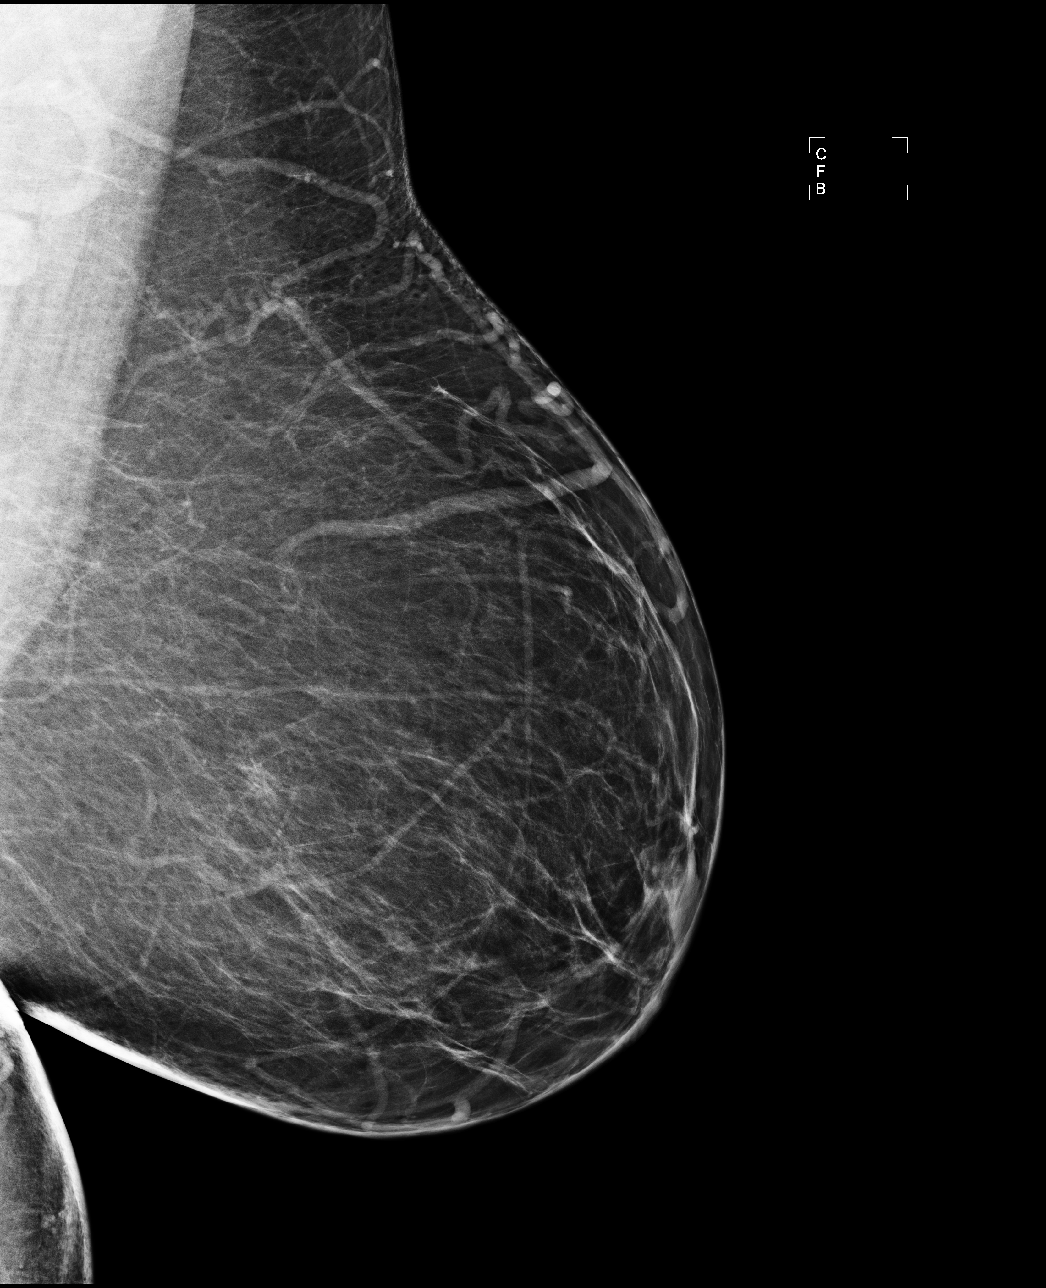

[R MLO]
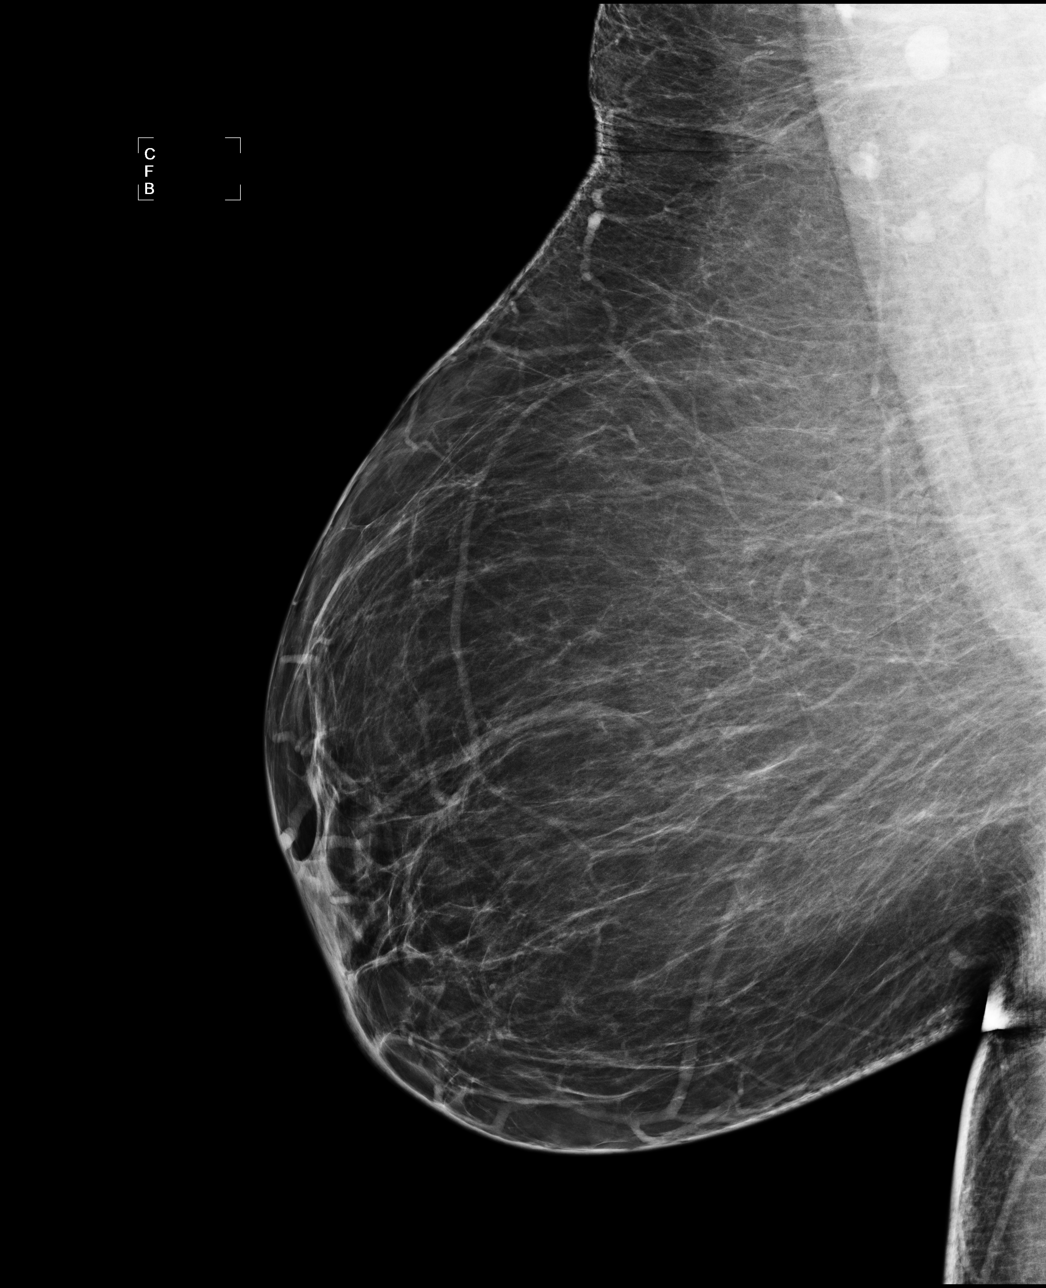

[4 of 4 positions shown; findings below may reference images not displayed]

IMPRESSION: No specific mammographic evidence of malignancy.  Next screening mammogram is recommended in one 
year.

A result letter of this screening mammogram will be mailed directly to the patient.

ASSESSMENT: Negative - BI-RADS 1

Screening mammogram in 1 year.
ANALYZED BY COMPUTER AIDED DETECTION. , THIS PROCEDURE WAS A DIGITAL MAMMOGRAM.

## 2010-09-16 LAB — BASIC METABOLIC PANEL
BUN: 9 mg/dL (ref 6–23)
CO2: 23 mEq/L (ref 19–32)
Calcium: 8.3 mg/dL — ABNORMAL LOW (ref 8.4–10.5)
Chloride: 109 mEq/L (ref 96–112)
Creatinine, Ser: 0.7 mg/dL (ref 0.4–1.2)
GFR calc Af Amer: >60 mL/min (ref >60)

## 2010-09-16 LAB — CBC
HCT: 42.8 % (ref 36.0–46.0)
Hemoglobin: 14.1 g/dL (ref 12.0–15.0)
MCHC: 33 g/dL (ref 30.0–36.0)
MCHC: 34.6 g/dL (ref 30.0–36.0)
MCV: 100.2 fL — ABNORMAL HIGH (ref 78.0–100.0)
MCV: 100.9 fL — ABNORMAL HIGH (ref 78.0–100.0)
Platelets: 135 10*3/uL — ABNORMAL LOW (ref 150–400)
RBC: 3.98 MIL/uL (ref 3.87–5.11)
RBC: 4.24 MIL/uL (ref 3.87–5.11)
WBC: 7.2 10*3/uL (ref 4.0–10.5)

## 2010-09-16 LAB — COMPREHENSIVE METABOLIC PANEL
ALT: 14 U/L (ref 0–35)
AST: 21 U/L (ref 0–37)
Alkaline Phosphatase: 86 U/L (ref 39–117)
CO2: 27 mEq/L (ref 19–32)
Chloride: 107 mEq/L (ref 96–112)
GFR calc Af Amer: >60 mL/min (ref >60)
GFR calc non Af Amer: >60 mL/min (ref >60)
Glucose, Bld: 110 mg/dL — ABNORMAL HIGH (ref 70–99)
Potassium: 3.5 mEq/L (ref 3.5–5.1)
Sodium: 141 mEq/L (ref 135–145)

## 2010-09-16 LAB — DIFFERENTIAL
Basophils Relative: 1 % (ref 0–1)
Eosinophils Absolute: 0.2 10*3/uL (ref 0.0–0.7)
Eosinophils Relative: 3 % (ref 0–5)
Neutrophils Relative %: 42 % — ABNORMAL LOW (ref 43–77)

## 2010-09-16 LAB — TROPONIN I: Troponin I: 0.02 ng/mL (ref 0.00–0.06)

## 2010-09-17 LAB — GC/CHLAMYDIA PROBE AMP, GENITAL
Chlamydia, DNA Probe: NEGATIVE
GC Probe Amp, Genital: NEGATIVE

## 2010-09-17 LAB — WET PREP, GENITAL: Clue Cells Wet Prep HPF POC: NONE SEEN

## 2010-09-23 LAB — POCT I-STAT, CHEM 8
Calcium, Ion: 1.09 mmol/L — ABNORMAL LOW (ref 1.12–1.32)
Chloride: 104 mEq/L (ref 96–112)
Creatinine, Ser: 1 mg/dL (ref 0.4–1.2)
Glucose, Bld: 105 mg/dL — ABNORMAL HIGH (ref 70–99)
Potassium: 3.5 mEq/L (ref 3.5–5.1)

## 2010-09-23 LAB — POCT CARDIAC MARKERS: Myoglobin, poc: 68 ng/mL (ref 12–200)

## 2010-09-24 LAB — POCT CARDIAC MARKERS
CKMB, poc: <1 ng/mL — ABNORMAL LOW (ref 1.0–8.0)
Troponin i, poc: <0.05 ng/mL (ref 0.00–0.09)

## 2010-09-29 ENCOUNTER — Emergency Department (HOSPITAL_COMMUNITY)
Admission: EM | Admit: 2010-09-29 | Discharge: 2010-09-29 | Disposition: A | Discharge status: Home / Self Care | Payer: Medicaid Other | Attending: Emergency Medicine | Admitting: Emergency Medicine

## 2010-09-29 ENCOUNTER — Emergency Department (HOSPITAL_COMMUNITY): Payer: Medicaid Other

## 2010-09-29 DIAGNOSIS — M25569 Pain in unspecified knee: Secondary | ICD-10-CM | POA: Insufficient documentation

## 2010-09-29 DIAGNOSIS — I10 Essential (primary) hypertension: Secondary | ICD-10-CM | POA: Insufficient documentation

## 2010-09-29 DIAGNOSIS — Z85038 Personal history of other malignant neoplasm of large intestine: Secondary | ICD-10-CM | POA: Insufficient documentation

## 2010-09-29 DIAGNOSIS — M1990 Unspecified osteoarthritis, unspecified site: Secondary | ICD-10-CM | POA: Insufficient documentation

## 2010-09-29 DIAGNOSIS — J45909 Unspecified asthma, uncomplicated: Secondary | ICD-10-CM | POA: Insufficient documentation

## 2010-09-29 DIAGNOSIS — R05 Cough: Secondary | ICD-10-CM | POA: Insufficient documentation

## 2010-09-29 DIAGNOSIS — R059 Cough, unspecified: Secondary | ICD-10-CM | POA: Insufficient documentation

## 2010-10-22 NOTE — Discharge Summary (Signed)
Brooke [name], Brooke [name]               ACCOUNT NO.:  [id number]   MEDICAL RECORD NO.:  [id number]          PATIENT TYPE:  INP   LOCATION:  1402                         FACILITY:  [facility]   PHYSICIAN:  Fleet Contras, M.D.    DATE OF BIRTH:  April 30, 1956   DATE OF ADMISSION:  11/19/2008  DATE OF DISCHARGE:  11/22/2008                               DISCHARGE SUMMARY   HISTORY OF PRESENTING ILLNESS:  Please see the dictated H&P for full  details.  In summary, Brooke [name] is a 55 year old African American lady  with past medical history significant for asthmatic bronchitis,  gastroesophageal reflux disease, history of colon cancer, tobacco abuse,  systemic hypertension, anxiety, and morbid obesity.  She was admitted  via the emergency room at [facility] with few days' history of  cough, chest congestion, wheezing, shortness of breath, and dyspnea on  exertion.  The cough was nonproductive and she had no hemoptysis.  She  denied having any chest pain, fevers, or chills.  At the emergency room,  she received nebulized bronchodilators, but her symptoms did not improve  significantly for her to be discharged home.  She was therefore admitted  to the hospital.   HOSPITAL COURSE:  On admission, her vital signs were stable.  She was  well hydrated, not icteric, not cyanosed.  Chest showed reduced air  entry at the bases with diffuse prolonged respiratory or wheezes.  Laboratory data essentially was within normal limits with chest x-ray  showing cardiomegaly with low lung volumes and no acute cardiopulmonary  process.  Her BNP was less than 30.  She was started on intravenous  fluids, intravenous Solu-Medrol, intravenous Rocephin, and oral  azithromycin.  She received nebulized albuterol and Atrovent, Protonix  for GI prophylaxis, and subcutaneous Lovenox for DVT prophylaxis.  Her  condition was slow to improve, but she was able to be converted to oral  antibiotics as well as oral prednisone.   She had episodes of anxiety  attacks that aggravated her asthma.  She was started on Xanax 0.5 mg  t.i.d. with some improvement in her symptoms.  Today, she was feeling  much better with less need for nebulized bronchodilators.  She was  afebrile.  Her vital signs showed a blood pressure of 133/82, heart rate  of 80, temperature 97.9, O2 sats on room air was 99%, and respiratory  rate was 20.  Her chest was clear to auscultation with few expiratory  wheezes.  Heart sounds 1 and 2 were heard.  She was therefore considered  stable for discharge home.   DISCHARGE DIAGNOSES:  1. Acute exacerbation of asthmatic bronchitis.  2. Anxiety disorder.  3. Systemic hypertension, well controlled.  4. Morbid obesity.   DISCHARGE MEDICATIONS:  1. Albuterol nebulized 2.5 mg q.6 h.  2. Atrovent nebulized 0.5 mg q.6 h.  3. Xanax 0.5 mg t.i.d. p.r.n.  4. Azithromycin 250 mg daily for 5 days.  5. Ceftin 500 mg p.o. b.i.d. for 5 days.  6. Symbicort 160/4.5 mcg 1 puff b.i.d., he is on p.o. taper over 7      days.  7.  Hydrochlorothiazide 25 mg p.o. daily.  8. Vicodin 5/500, one p.o. q.6 p.r.n.  9. Singulair 10 mg p.o. at bedtime.  10.Nicotine patch 1 mg daily.  11.Protonix 40 mg p.o. daily.  12.Ambien 10 mg p.o. at bedtime p.r.n. for sleep.   She is to follow up with me in the office in 1 week.   CONDITION ON DISCHARGE:  Stable.   DISPOSITION:  Home.   The discharge plan was discussed with her and her questions answered.      Fleet Contras, M.D.  Electronically Signed     EA/MEDQ  D:  11/22/2008  T:  11/23/2008  Job:  [id number]

## 2010-10-22 NOTE — H&P (Signed)
[name], [name]               ACCOUNT NO.:  [id number]   MEDICAL RECORD NO.:  [id number]          PATIENT TYPE:  INP   LOCATION:  1402                         FACILITY:  [facility]   PHYSICIAN:  Fleet [name], M.D.    DATE OF BIRTH:  06-25-1955   DATE OF ADMISSION:  11/19/2008  DATE OF DISCHARGE:                              HISTORY & PHYSICAL   PRESENTING COMPLAINT:  Shortness of breath, cough, wheezing.   HISTORY OF PRESENT ILLNESS:  Brooke [name] is a 55-year African American  lady with past medical history significant for asthmatic bronchitis,  gastroesophageal reflux disease, history of colon cancer, tobacco abuse,  systemic hypertension and morbid obesity.  She presented to the  emergency room at [facility] with a few days history of cough,  chest congestion associated with wheezing, shortness of breath and  dyspnea on exertion.  She denied having any chest pain.  She had no  fevers or chills.  Her cough been more mostly dry, nonproductive without  hemoptysis.   At the emergency room, she had diffuse wheezing all over her lungs.  She  received nebulized bronchodilators intensively but her symptoms did not  improve significantly for her to be discharged home.  She is therefore  admitted to the hospital for close monitoring and further therapy.   PAST MEDICAL HISTORY:  1. Asthmatic bronchitis.  2. Gastroesophageal reflux disease.  3. Tobacco abuse.  4. Systemic hypertension.  5. Morbid obesity.   MEDICATIONS:  1. Hydrochlorothiazide 25 mg daily.  2. Ibuprofen 800 mg t.i.d. p.r.n.  3. Albuterol inhaler 2 puffs q.6 h p.r.n.  4. Atrovent inhaler 2 puffs q.6 h p.r.n.  5. Prilosec 20 mg p.o. b.i.d.  6. Singulair 10 mg p.o. daily.   ALLERGIES:  ASPIRIN OF UNKNOWN REACTION.   SOCIAL HISTORY:  She is married and lives with her family.  She smokes  about one pack of cigarettes per day.  Denies using alcohol or illicit  drugs.   REVIEW OF SYSTEMS:  She has no  dizziness, blurring of vision, weakness  of extremities or syncope.  She has some headaches.  GI: She has no  nausea or vomiting, diarrhea, abdominal pain.  GU: She denies any  dysuria, frequency or hematuria.  MUSCULOSKELETAL: She does have some  aches and pains in her lower back and legs.   PHYSICAL EXAMINATION:  GENERAL:  She is lying comfortably in the  hospital bed flat, not in acute respiratory or painful distress.  She is  not pale.  She is not icteric.  She is not cyanosed.  She is well-  hydrated.  VITAL SIGNS:  Blood pressure is 118/73, heart rate of 81, respiratory  rate of 24, temperature is 97.5, O2 sats on 2 liters is 99%.  She weighs  in at 125 kg and she is 5 feet tall.  NECK:  Her neck is short, supple, nontender, no palpable masses.  No  elevated JVD or cervical lymphadenopathy.  CHEST:  Shows reduced air entry at the bases with diffuse expiratory  wheezes and rhonchi, no rales.  CARDIOVASCULAR:  Heart sounds S1,  S2 were heard with no murmurs, no S3  gallops.  ABDOMEN:  Abdomen is obese, soft, nontender, no masses.  Bowel sounds  are present.  EXTREMITIES:  Shows no edema, tenderness or swelling.  CNS: She is alert and oriented x3 with no focal neurological deficits.   LABORATORY DATA:  White count is 7.5, hemoglobin 14.1, hematocrit 42.8,  platelet count of 444,000.  Sodium is 141, potassium 3.5, chloride 107,  bicarbonate of 27, glucose is 110, BUN 12, creatinine 0.84.  LFTs within  normal limits.  BNP is less than 30.  Chest x-ray showed cardiomegaly  with low lung volumes.  No acute cardiopulmonary process.   ASSESSMENT:  This admission for this 55 year old African American lady  with tobacco related chronic bronchitis and asthma.  She presents with  progressive cough, wheezing, shortness of breath and dyspnea on  exertion.  She is being admitted to the hospital for close monitoring  and further therapy.   ADMISSION DIAGNOSES:  1. Acute suspicion of chronic  asthmatic bronchitis.  2. History of tobacco abuse.   PLAN OF CARE:  She will be on a telemetry bed.  Vital signs q.4 h, IV  Solu-Medrol 50 mg q.6 h, IV Rocephin 1 gram daily, nebulized albuterol 5  mg and Atrovent 0.5 mg q.4 h, Protonix 40 mg daily, subcutaneous Lovenox  for DVT prophylaxis.  Her home medications will be continued.  IV fluids  with half normal saline 75 mL an hour.   This plan of care has been discussed with her and her questions  answered.      Fleet [name], M.D.  Electronically Signed     EA/MEDQ  D:  11/20/2008  T:  11/20/2008  Job:  [id number]

## 2010-10-22 NOTE — Discharge Summary (Signed)
NAMEKORYNNE, DOLS NO.:  [id number]   MEDICAL RECORD NO.:  [id number]          PATIENT TYPE:  INP   LOCATION:  1436                         FACILITY:  [facility]   PHYSICIAN:  Fleet Contras, M.D.    DATE OF BIRTH:  July 26, 1955   DATE OF ADMISSION:  08/27/2007  DATE OF DISCHARGE:  08/28/2007                               DISCHARGE SUMMARY   HISTORY OF PRESENT ILLNESS:  Ms. Yetta Barre is a 52-year African American  lady with past medical history significant for hypertension, bronchial  asthma, tobacco abuse and colon cancer.  She presented to the emergency  room at [facility] with a 1-day history of anterior chest wall  pain associated with weakness in the arms.  She has had cough productive  of yellowish sputum on and off.  She continues to smoke cigarettes but  has been trying to cut down.  She had no orthopnea, PND or hemoptysis.  Her coughing made her chest pain worse.  In the emergency room her vital  signs were stable but because of her multiple cardiovascular risks, she  was admitted to the hospital to rule out acute coronary syndrome.   HOSPITAL COURSE:  On admission she was started on Vicodin 1-2 p.o. q.6 h  p.r.n., sublingual nitro 0.4 mg, IV fluids, nebulized bronchodilators  and placed on telemetry.  Her chest x-ray showed chronic bronchitic  changes without any acute infiltrates.  EKG showed normal sinus rhythm.  Serial CKs and troponin were performed and so far have been negative.  She is feeling better this morning and got relief for the chest pain  with Vicodin and she is therefore considered stable for discharge home  this morning.   PHYSICAL EXAMINATION:  She is afebrile.  Her vital signs are stable.  Her chest is showing some scattered expiratory wheezes.  She is tender  to anterior chest wall palpation.  Abdomen is benign.  She has no edema or calf tenderness in her extremities.  She is alert and oriented x3.  No focal neurological  deficits.   LABORATORY DATA:  White count is 6.2, hemoglobin 15.6, platelet count of  187.  Sodium is 139, potassium was 3.3, chloride 101, bicarbonate of 32,  BUN 10, creatinine 0.74 and glucose of 118.  D-dimer was 0.33.  CPK and  troponin x3 were negative.  She is therefore considered stable for  discharge home.   DISCHARGE DIAGNOSES:  1. Acute bronchitis.  2. Chest wall pain, atypical for acute coronary syndrome.  Myocardial      infarction has been ruled out/  3. Hypokalemia, repleted.  4. Tobacco abuse.  5. Controlled systemic hypertension.   DISCHARGE/PLAN:  The patient is considered stable for discharge home.   CONDITION ON DISCHARGE:  Stable.   DISPOSITION:  Home.   DISCHARGE MEDICATIONS:  Will include Avelox 100 mg daily for a week,  potassium chloride 10 mEq daily to take along with her  hydrochlorothiazide, albuterol HFA 2 puffs q. 6 p.r.n., albuterol  nebulizer 2.5 mg q.6 h p.r.n.,  Vicodin 5/500 1-2 p.o. q. six p.r.n., nicotine patch  21 mg per day for 1  month and then adjusted when she calls back to the office, Mucinex DM 1-  2 p.o. q. 12, and Mobic 15 mg 1 p.o. daily.   This plan of care has been discussed with her and her husband and their  questions answered.      Fleet Contras, M.D.  Electronically Signed     EA/MEDQ  D:  08/28/2007  T:  08/28/2007  Job:  [id number]

## 2010-10-22 NOTE — Procedures (Signed)
Brooke [name], Brooke [name]               ACCOUNT NO.:  [id number]   MEDICAL RECORD NO.:  [id number]          PATIENT TYPE:  OUT   LOCATION:  SLEEP CENTER                 FACILITY:  [facility]   PHYSICIAN:  Clinton D. Maple Hudson, MD, FCCP, FACPDATE OF BIRTH:  1955/11/03   DATE OF STUDY:  01/25/2007                            NOCTURNAL POLYSOMNOGRAM   REFERRING PHYSICIAN:   REFERRING PHYSICIAN:  Fleet Contras, M.D.   INDICATION FOR STUDY:  Hypersomnia with sleep apnea.   EPWORTH SLEEPINESS SCORE:  21/24.  BMI 52.3.  Weight 260 pounds.   HOME MEDICATIONS:  Provigil, Singulair.   A previous diagnostic study on Oct 30, 2003 recorded an AIH of 13 per  hour.  A diagnostic NPSG protocol was requested.   SLEEP ARCHITECTURE:  Total sleep time 290 minutes with sleep efficiency  79%.  Stage I was 13%.  Stage II 54%.  Stage III 27%.  REM 6% of total  sleep time.  Sleep latency 3 minutes.  REM latency 60 minutes.  Awake  after sleep onset 76 minutes.  Arousal index 18.6.  Singulair was taken  at bedtime.   RESPIRATORY DATA:  Apnea-hypopnea index (AHI/RDI) 7.2 obstructive events  per hour, indicating mild obstructive sleep apnea/hypopnea syndrome.  There were two obstructive apneas and 33 hypopneas.  Events were most  common while supine but also present inside sleeping positions.  REM AIH  58.4.   OXYGEN DATA:  Snoring throughout the study with oxygen desaturation to a  nadir of 84%.  Mean oxygen saturation through the study was 95% on room  air.   CARDIAC DATA:  Sinus rhythm with frequent PVCs.   MOVEMENT-PARASOMNIA:  No specific scorable limb jerks.  Patient  described as very restless through the study.  She complains that her  sinuses were bothering her and brought gum to help assist with her  cigarette habit.  Frequent brief wakings were noted after 3:45 a.m.,  nonspecific.   IMPRESSIONS-RECOMMENDATIONS:  1. Mild obstructive sleep apnea/hypopnea syndrome, AIH 7.2 per hour.      Events were  more common while supine and especially while in REM.      Persistent snoring with oxygen saturation to a nadir of 84%.  2. Scores in this range are not usually address with CPAP.      Encouragement to lose weight, treatment for nasal congestion, and      encouragement to sleep off the flat of her back will only prove      helpful.  3. Note complaints of sinus bothering and reference to cigarette      habit.  If the patient smokes heavily, then      nicotine withdrawal during sleep can be a cause for sleep      disturbance and arousal.  4. Frequent premature ventricular contractions.      Clinton D. Maple Hudson, MD, [facility], FACP  Diplomate, Biomedical engineer of Sleep Medicine  Electronically Signed     CDY/MEDQ  D:  02/06/2007 09:34:14  T:  02/07/2007 10:36:16  Job:  [id number]

## 2010-10-22 NOTE — Discharge Summary (Signed)
NAMEEARMA, NICOLAOU               ACCOUNT NO.:  [id number]   MEDICAL RECORD NO.:  [id number]          PATIENT TYPE:  INP   LOCATION:  1522                         FACILITY:  [facility]   PHYSICIAN:  Fleet Contras, M.D.    DATE OF BIRTH:  [birth year]   DATE OF ADMISSION:  04/14/2008  DATE OF DISCHARGE:  04/17/2008                               DISCHARGE SUMMARY   HISTORY OF PRESENTING ILLNESS:  Ms. Curtice is a 55 year old African  American lady with past medical history significant for bronchial  asthma, tobacco abuse, systemic hypertension and morbid obesity.  She  came to the emergency room at [facility] with few days'  history of progressively worsening chest congestion, cough, shortness of  breath, wheezing and chest pain with coughing and deep breathing.  At  the emergency room her vital signs were stable.  She had diffuse  wheezing over her lungs.  Chest x-ray showed no acute infiltrates.  CT  scan of the chest was performed to rule out pulmonary embolism and this  was no flow poor ability and she was therefore admitted  to the hospital  for antibiotics, steroid therapy and nebulized bronchodilators.   HOSPITAL COURSE:  On admission the patient's potassium was 3.1 and this  was repleted both intravenously as well as orally.  Her sed rate was 16.  BNP was less than 30.  Initial point of care troponin and CPK were both  normal.  She was on IV Solu-Medrol 50 mg every 6 hours, nebulized  albuterol 2.5 mg every 4 hours and p.r.n., nebulized Atrovent 0.25 mg  every 6 hours and nicotine patch 21 mg per day was put on her for  smoking cessation and she was extensively counseled about smoking.  She  received Vicodin 5/500 one p.o. every 6 p.r.n. and Flexeril 10 mg t.i.d.  for chest pain with resolution of her symptoms.  She also had some low  back pain and abdominal pain and this was thought to be musculoskeletal  and improved with conservative management.  She was on Protonix 40  mg  daily and DVT prophylaxis with subcu Lovenox.  She was on oral Avelox  400 mg daily.  Today she feels much better and is still having some  cough but no significant sputum production.  She has had no fevers or  chills.   Her vital signs are stable with blood pressure 127/90, heart rate of 70  regular, respiratory rate of 20, temperature 98.2 and O2 saturation on 2  liters of nasal cannula is 100%.  Her chest is clear to auscultation  with no rales, rhonchi or wheezes.  Heart sounds one and two were heard  with no murmurs, no S3 gallops.  Abdomen is obese, soft, nontender, no  masses.  Bowel sounds are present.  Extremities showed no edema, calf  tenderness or swelling.  CNS: She is alert and oriented x3 with no focal  neurological deficits.   LABORATORY DATA:  White count of 9.8, hemoglobin 13.4, hematocrit 40.8,  platelet count of 173.  Sodium is 138, potassium 4.2, chloride 100,  bicarbonate of  30, BUN is 11, creatinine 0.78 and glucose is 128.  She  is now considered stable for discharge home.   DISCHARGE DIAGNOSES:  1. Acute exacerbation of chronic obstructive disease/asthma.  2. Chest wall pain.  3. Chronic low back pain.  4. Systemic hypertension.   MEDICATION ON DISCHARGE:  1. Avelox 400 mg daily .  2. Prednisone 20 mg to taper over 7 days.  3. Albuterol nebulized  2.5 mg every 6 hours p.r.n.  4. Tussionex 5 mL b.i.d. p.r.n.  5. Vicodin 5/500 one to two p.o. every 6 p.r.n.  6. Hydrochlorothiazide 12.5 mg p.o. daily.  7. Ibuprofen 800 mg p.o. t.i.d. p.r.n.  8. Flexeril 10 mg p.o. t.i.d. p.r.n.   CONDITION ON DISCHARGE:  Is stable.   DISPOSITION:  Her disposition is for home.   DISCHARGE PLAN:  Has been discussed with her and her questions answered.      Fleet Contras, M.D.  Electronically Signed     EA/MEDQ  D:  04/17/2008  T:  04/17/2008  Job:  [id number]

## 2010-10-25 NOTE — Op Note (Signed)
[place]  Patient:    Brooke [name], Brooke [name] Visit Number: [id number] MRN: [id number]          Service Type: END Location: ENDO Attending Physician:  Charna Elizabeth Dictated by:   Anselmo Rod, M.D. Proc. Date: 12/29/01 Admit Date:  12/29/2001 Discharge Date: 12/29/2001                             Operative Report  DATE OF BIRTH:  02/28/1956  PROCEDURE PERFORMED:  Colonoscopy with biopsies.  ENDOSCOPIST:  Anselmo Rod, M.D.  INSTRUMENT USED:  Olympus colonoscope.  INDICATIONS FOR PROCEDURE:  A 55 year old African-American female with a person history of colon cancer diagnosed at age 61.  The patient underwent a partial colectomy.  The patient seen in the office for repeat colorectal cancer screening.  Her last colonoscopy was in 1998.  The patient has not had GI followup since then.  Therefore, colorectal cancer screening if being done.  PREPROCEDURE PREPARATION:  Informed consent was procured from the patient. The patient was fasted for eight hours prior to the procedure, and prepped with a bottle of magnesium of citrate and a gallon of NuLytely the night prior to the procedure.  The patient also had a CT scan of the abdomen and pelvis to evaluate right lower quadrant pain and history of guaiac-positive stools on 12/23/01 which showed no evidence of lymphadenopathy or free fluid.  A 4-cm cystic lesion was seen on the left adnexa, and follow-up ultrasound was recommended in six weeks.  PREPROCEDURE PHYSICAL:  VITAL SIGNS:  The patient had stable vital signs.  NECK:  Supple.  CHEST:  Clear to auscultation.  CARDIAC:  S1 and S2 is regular.  ABDOMEN:  Obese with well-healed surgical scar from previous colectomy with right lower quadrant tenderness on palpation with guarding, no rebound, no rigidity, no hepatosplenomegaly.  DESCRIPTION OF THE PROCEDURE:   The patient was placed in the left lateral decubitus position and sedated with 60  mg of Demerol and 6 mg of Versed intravenously.  Once the patient was adequately sedate and maintained on low-flow oxygen and continuous cardiac monitoring, the Olympus videocolonoscope was advanced from the rectum up to 60 cm.  Anastomosis was healthy and was identified by presence of small bowel mucosa beyond the anastomosis.  A small patch of erythema was noted at 10 cm.  This was biopsied for pathology.  The rest of the colonic mucosa appeared normal.  IMPRESSION: 1. Small patch of erythema biopsied at 10 cm. 2. Otherwise healthy anastomosis at 60 cm.  RECOMMENDATIONS: 1. Repeat colorectal cancer screening as recommended in the next five years if    the biopsies show no evidence of atypia or malignancy. 2. Outpatient followup in the next two weeks for further    recommendations. Dictated by:   Anselmo Rod, M.D. Attending Physician:  Charna Elizabeth DD:  12/29/01 TD:  01/01/02 Job: [id number]

## 2010-10-25 NOTE — Discharge Summary (Signed)
Brooke [name], Brooke [name]               ACCOUNT NO.:  [id number]   MEDICAL RECORD NO.:  [id number]          PATIENT TYPE:  INP   LOCATION:  6732                         FACILITY:  [facility]   PHYSICIAN:  Fleet Contras, M.D.    DATE OF BIRTH:  September 30, 1955   DATE OF ADMISSION:  04/29/2005  DATE OF DISCHARGE:  05/03/2005                                 DISCHARGE SUMMARY   PRESENTATION:  Brooke [name] is a 55 year old African-American lady with past  medical history significant for history of colon cancer, gastroesophageal  reflux disease, chronic bronchitis/asthma and tobacco abuse.  She had been  treated as an outpatient for acute exacerbation of chronic bronchitis, but  her symptoms did not improve on oral Zithromax, she therefore presented to  the Emergency Room at [facility] with two weeks of cough and cold,  which has progressively gotten worse.  She denied any fevers or chills, but  her cough was mainly dry without hemoptysis.  She had some wheezing, rhonchi  in her chest, and chest pain with coughing and deep breathing.  She received  nebulized bronchodilators in the emergency room with prednisone orally, but  her symptoms did not improve, so she was admitted for further treatment.   HOSPITAL COURSE ON ADMISSION:  The patient was transferred to [facility] where a bed was available.  She was on IV Solu-Medrol 50 mg q.6h.,  IV Rocephin 1 gram daily, and she received nebulized albuterol 5 mg with  Atrovent 0.5 mg q.4h. via hand-held nebulizer.  She was on Tussionex 5 mls  b.i.d. for cough, Lovenox 40 mg subcu for DVT prophylaxis, and Protonix 40  mg daily for GI prophylaxis.  She received ibuprofen 800 t.i.d. with  Percocet 5/325 one q.6h. for chest pain.  Her symptoms improved gradually  during the course of her hospitalization she had an episode of coughing bout  when she was very anxious and could hardly catch her breath.  She received  Xanax 0.25 mg b.i.d. for anxiety  with symptom improvement.  Her IV  antibiotics was able to be converted to oral Avelox on May 01, 2005,  and also Solu-Medrol was changed to oral prednisone on May 02, 2005.  She has continued to improve on oral treatment.  Today she is feeling much  better, as she still has some cough which is nonproductive.  She tried to  ambulate and did have some low back pain, which is chronic.  Her vital signs  are stable, she is afebrile, she is well-hydrated.  Her chest shows good air  entry bilaterally with a few expiratory wheezes.  Heart sounds S1, S2 are  heard with no murmurs.  Abdomen is obese, soft, nontender, no masses, bowel  sounds are present.  Extremities shows no edema, no calf tenderness or  swelling.  CNS:  She is alert and oriented x3 with no focal neurological  deficit.   DISCHARGE DIAGNOSES:  1.  Acute exacerbation of chronic bronchitis/asthma.  2.  Anxiety disorder.  3.  History of hypertension.  4.  History of chronic low back pain.  DISCHARGE MEDICATIONS:  1.  Albuterol MDI two puffs q.6h. p.r.n.  2.  Albuterol nebulizers 5/2.5 q.6h. p.r.n.  3.  Hydrochlorothiazide 12.5 mg p.o. daily.  4.  Zyrtec 10 mg p.o. daily.  5.  Ibuprofen 800 mg one p.o. t.i.d. p.r.n.  6.  Flexeril 10 mg q.h.s. p.r.n.  7.  Protonix 40 mg daily.  8.  Prednisone 20 mg p.o. b.i.d. for three days and then one p.o. daily for      four days.  9.  Avelox 400 mg daily for five days.  10. Xanax 0.25 mg p.o. b.i.d. p.r.n.  11. Percocet 5/325 one p.o. q.6h. p.r.n.   She will be seen for followup in my office in 1-2 weeks.  She has been  advised to quit tobacco use.   DISPOSITION:  Home.   CONDITION ON DISCHARGE:  Stable.      Fleet Contras, M.D.  Electronically Signed     EA/MEDQ  D:  05/03/2005  T:  05/03/2005  Job:  [id number]

## 2010-10-25 NOTE — Procedures (Signed)
NAME:  Brooke [name], KOOYMAN             ACCOUNT NO.:  [id number]   MEDICAL RECORD NO.:  [id number]          PATIENT TYPE:  OUT   LOCATION:  SLEEP CENTER                 [facility]   PHYSICIAN:  Clinton D. Maple Hudson, M.D. DATE OF BIRTH:  11/06/1955   DATE OF ADMISSION:  01/14/2004  DATE OF DISCHARGE:  01/14/2004                              NOCTURNAL POLYSOMNOGRAM   INDICATION FOR STUDY:  Hypersomnia with sleep apnea.  Difficulty initiating  and maintaining sleep.  Excessive daytime somnolence.  Epworth sleepiness  score 17/24.  Neck size 16 inches.  BMI 52.  Weight 259 pounds.  No regular  medication except multivitamins reported.  The previous study was recorded  on Oct 30, 2003.  RDI 13.2 per hour with loud snoring and normal cardiac  rhythm.   SLEEP ARCHITECTURE:  Total sleep time was short, 255 minutes, with a sleep  efficiency of 70%.  Stage 1 was 9%, stage 2 of 53%, stages 3 and 4, 11%, RAM  was 27% of total sleep time.  Latency to sleep onset 23 minutes.  Latency to  RAM 41 minutes, awake after sleep onset 31 minutes, arousal index 24 per  hour.  Technician commented that patient was sitting up in bed frequently  eating and drinking and also repeatedly on the cell phone.  Video tape  documents the cell phone use at 4:30 a.m. and 5:15 a.m. reflecting poor  sleep hygiene.   RESPIRATORY DATA:  Mild obstructive sleep apnea/hypopnea syndrome, RDI 13.2  per hour including 49 obstructive hypopneas and 7 obstructive apneas.  Events were not positional.  REM RDI was 37.9 per hour.  Split protocol had  been requested, but she did not qualify due to lack of sleep and delayed  appearance of respiratory events.  She would qualify to return for CPAP  titration study if appropriate.  Would suggest bringing a sleep medication  on return.   OXYGEN DATA:  Mild to moderate snoring with mild to moderate oxygen  desaturation to 80%.  Mean saturation through the study was 96%.   CARDIAC DATA:   Sinus rhythm with prevent PVCs noted throughout the study.   MOVEMENT/PARASOMNIA:  40 body jerks were recorded of which 8 were associated  with arousals for periodic limb movement with arousal index of 1.9 per hour.   IMPRESSION/RECOMMENDATION:  Mild obstructive sleep apnea/hypopnea,  respiratory disturbance index 13.2 per hour.  Unable to utilized a split  study protocol on this night.  Consider return for CPAP titration.  Moderate  oxygen desaturation with apneas.  Frequent premature ventricular  contractions throughout the study.  Mild periodic limb movement.  No  comments related to poor sleep hygiene.                                  ______________________________                                Rennis Chris. Maple Hudson, M.D.  Diplomate, American Board of Sleep Medicine   CDY/MEDQ  D:  01/21/2004 11:29:17  T:  01/22/2004 08:17:30  Job:  [id number]

## 2010-10-25 NOTE — H&P (Signed)
Brooke [name], [name]               ACCOUNT NO.:  [id number]   MEDICAL RECORD NO.:  [id number]           PATIENT TYPE:   LOCATION:                                 FACILITY:   PHYSICIAN:  Fleet Contras, M.D.         DATE OF BIRTH:   DATE OF ADMISSION:  DATE OF DISCHARGE:                                HISTORY & PHYSICAL   PRESENTING COMPLAINT:  1.  Cough.  2.  Shortness of breath.  3.  Chest pain.   HISTORY OF PRESENT COMPLAINT:  Brooke [name] is a 55 year old African-American  lady with past medical history significant for chronic bronchitis,  gastroesophageal reflux disease, history of colon cancer, and tobacco abuse.  She presented to the emergency room at [facility] with two weeks  of cough and cold which has progressively gotten worse.  She was seen in the  office about a week ago and treated for acute exacerbation of chronic  bronchitis and acute sinusitis with Zithromax.  Her symptoms, however, did  not completely subside over the weekend and she presented to the emergency  room earlier today with worsening symptoms.  She has some nasal congestion,  sneezing, and postnasal drip associated with a dry cough, nonproductive,  without hemoptysis.  She has some wheezing and rhonchi in her chest and she  began to have pain in her chest with coughing and deep breathing both  anteriorly and to the sides of the chest.  She denied having any fevers,  chills and she had no orthopnea, PND, or palpitations.  At the emergency  room she received two sets of nebulized albuterol as well as prednisone 100  mg.  Her symptoms did not improve significantly and she has therefore been  hospitalized for further evaluation.   PAST MEDICAL HISTORY:  1.  History of colon cancer at age 55.  2.  History of gastroesophageal reflux disease.  3.  History of tobacco-related chronic bronchitis.  4.  History of tobacco abuse.  5.  Hypertension.   PAST SURGICAL HISTORY:  Colon surgery.   MEDICATIONS:  1.  Nexium 40 mg once a day.  2.  Zyrtec 10 mg daily.  3.  Hydrochlorothiazide 12.5 mg daily.  4.  Albuterol MDI two puffs q.6h. p.r.n.  5.  Ibuprofen over-the-counter.   ALLERGIES:  She has no known drug allergies.   SOCIAL HISTORY:  She is married and lives with her family.  She smokes one  pack of cigarettes daily.  She denies any use of alcohol or illicit drugs.   REVIEW OF SYSTEMS:  CNS:  She does have some headaches, but denies any  dizziness, blurring of vision, weakness of extremities, or syncope.  RESPIRATORY:  As above.  GI:  She has no nausea, abdominal pain, diarrhea,  or vomiting.  GU:  She denies dysuria, frequency, or hematuria.  MUSCULOSKELETAL:  She does have some pain in her chest wall with coughing  and deep breathing.   PHYSICAL EXAMINATION:  GENERAL:  Brooke [name] is lying in bed, not in acute  respiratory or painful distress.  She  has audible wheezes.  VITAL SIGNS:  Blood pressure 126/66, heart rate 86, respiratory rate 20,  temperature 97.4.  HEENT:  She is alert and oriented x3.  Her pupils are equal and reactive to  light and accommodation.  She is not cyanotic.  She is not icteric and she  is not dehydrated.  NECK:  Supple with no elevated JVD or cervical lymphadenopathy.  CHEST:  Reduced air entry at the bases with diffuse expiratory wheezes and  rhonchi.  There are no rales.  HEART:  Sounds 1 and 2 are heard with no murmurs.  No S3 gallops.  ABDOMEN:  Obese, soft, nontender.  No palpable masses.  Bowel sounds are  present.  EXTREMITIES:  No edema.  No calf tenderness.  No swelling.  CENTRAL NERVOUS SYSTEM:  She is alert and oriented x3 with no focal  neurological deficits.   LABORATORY DATA:  Chest x-ray is reported to be negative for any  infiltrates.  White count is 7, hemoglobin 14.4, platelet count 183.  Sodium  is 136, potassium 3.6, chloride 101, bicarbonate 225, BUN 8, creatinine 0.9,  and glucose of 181, calcium is 8.3.    ASSESSMENT:  Brooke [name] is a 55 year old African-American lady with past  medical history significant for tobacco-related chronic bronchitis.  She  presents to the emergency room today with progressive cough, wheezing, and  shortness of breath associated with pleuritic chest pain.  She has received  handheld nebulized albuterol in the emergency room without significant  improvement in her symptoms and she has therefore been admitted for further  evaluation and appropriate therapy.   ADMITTING DIAGNOSES:  1.  Acute exacerbation of chronic bronchitis.  2.  Tobacco abuse.   PLAN OF CARE:  She will be placed in a regular bed.  Vital signs will be  monitored q.4h.  She will be started on IV Solu-Medrol 50 mg q.6h., IV  Rocephin 1 g daily.  She will receive albuterol 5 mg with Atrovent 0.5 mg  handheld nebulizer q.4h., Protonix 40 mg daily, Tussionex 5 mL b.i.d. for  cough, Lovenox 40 mg subcutaneously daily for DVT prophylaxis.  Her home  medication of HCTZ will be continued for her blood pressure.  She will  receive intravenous fluids with half normal saline of 75 mL/hour.  Ibuprofen  800 mg t.i.d. and Vicodin 5/500 one to two tablets q.6h. p.r.n. for chest  pain.  This plan of care has been discussed with her and all her questions  have been answered.      Fleet Contras, M.D.  Electronically Signed     EA/MEDQ  D:  04/29/2005  T:  04/30/2005  Job:  [id number]

## 2010-10-25 NOTE — Discharge Summary (Signed)
[name], [name]               ACCOUNT NO.:  [id number]   MEDICAL RECORD NO.:  [id number]          PATIENT TYPE:  INP   LOCATION:  1304                         FACILITY:  [facility]   PHYSICIAN:  Fleet Contras, M.D.    DATE OF BIRTH:  1955/09/20   DATE OF ADMISSION:  02/16/2005  DATE OF DISCHARGE:  02/22/2005                                 DISCHARGE SUMMARY   HISTORY OF PRESENT ILLNESS:  Brooke [name] is a 55 year old African-American  lady with past medical history significant for tobacco abuse,  gastroesophageal reflux disease, allergic rhinitis, and history of colon  cancer. She was admitted via the emergency room at [facility] with  one week of progressively worsening cough, nasal congestion, wheezing and  rhonchi. In the emergency room, she was noted to be in mild respiratory  distress. She received nebulized bronchodilators but her symptoms did not  improve. She was therefore admitted for further evaluation and treatment. On  admission, vital signs were blood pressure 114/53, heart rate of 68,  respiratory rate 20, temperature 98.2, O2 saturations on room air was 96%.  Her chest did show diffuse wheezes and rhonchi with no rales. Initial chest  x-ray was reported to be negative.   HOSPITAL COURSE:  She was admitted to a medical bed and started on  intravenous Solu-Medrol 50 mg q.6, albuterol 2.5 mg and Atrovent 25 mg  handheld nebulizer q.4 hours. Intravenous Rocephin and intravenous  Zithromax. She was also started on Protonix 40 mg once a day, Tussionex 5 mL  b.i.d. for cough. Subcu Lovenox was given for DVT prophylaxis. Within 48  hours, her symptoms had persisted. She was placed on a nicotine patch for  tobacco cessation. A CT scan of the chest and sinuses was performed to rule  out any bronchial obstruction. This did reveal a right lower lobe infiltrate  with ethmoid sinusitis, there were no obstructive lesions. She had some  episodes of hyperglycemia which  responded to steroid therapy. She was on  sliding scale NovoLog insulin and CBGs were monitored q.a.c. and h.s.  Her  albuterol was continued and she was also started on Pulmicort nebulizers and  her steroids was changed to oral. She was started on nasal spray Nasonex for  acute sinusitis. Her latest laboratory data on February 20, 2005 shows  white count of 10.3, hemoglobin 13.6, hematocrit 40.1, platelet count of  205. Sodium was 137, potassium 4.1, chloride 100, bicarbonate of 31, BUN 12,  creatinine 0.8 and glucose of 120. She is now on oral antibiotics and oral  steroids. Today she has less cough with no sputum, she has no chest pain, no  shortness of breath. Her vital signs are stable. She has very few  respiratory wheezes with no rhonchi and no rales. Abdomen is benign.  Extremities show no edema or calf tenderness.   ASSESSMENT:  Brooke [name] is a 55 year old African-American lady with past  medical history of tobacco abuse admitted for acute exacerbation of chronic  bronchitis and right lower lobe pneumonia and acute sinusitis. She has made  significant improvement on the above therapeutic plan.  She is considered  stable for discharge on today.   DISCHARGE DIAGNOSES:  1.  Right lower lobe pneumonia.  2.  Acute exacerbation of chronic bronchitis and asthma.  3.  Acute sinusitis.   DISCHARGE MEDICATIONS:  1.  Nicotine patch 21 q. 24 hours.  2.  Hydrochlorothiazide 12.5 mg once a day.  3.  Mucinex DM 2 tablets b.i.d.  4.  Nasonex nasal spray 2 sprays each nostril once a day.  5.  Nexium 40 mg once a day.  6.  Albuterol nebulizer 2.5 mg per unit dose q.i.d. p.r.n.  7.  Avelox 400 mg for 5 more days.  8.  Prednisone 20 mg to be tapered over a week.  9.  Ibuprofen 800 mg, 1 p.o. q.8 hourly p.r.n.  10. Albuterol MDI 2 puffs q.6 p.r.n.  11. Flexeril 10 mg, 1 p.o. t.i.d. p.r.n.   CONDITION ON DISCHARGE:  Stable.   DISPOSITION:  Home.   FOLLOWUP:  With me in 2 weeks.       Fleet Contras, M.D.  Electronically Signed     EA/MEDQ  D:  02/22/2005  T:  02/24/2005  Job:  [id number]

## 2010-10-25 NOTE — Assessment & Plan Note (Signed)
[facility]                           GASTROENTEROLOGY OFFICE NOTE   NAME:[name], Brooke SEMEL                      MRN:          [id number]  DATE:03/16/2006                            DOB:          September 29, 1955   Brooke [name] is a 55 year old black female employed by the [employer] who is referred by Dr. Clearance Coots for evaluation of abdominal pain.   I have not seen 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 Jamey Ripa and Dr. Maryfrances Bunnell. 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 She was seen post-operatively by Dr.  Arline Asp and received only two doses of chemotherapy and then apparently was  lost to followup, but lost to followup by [place] additionally. Actually in 1995,  we were told by [facility] that they would do her colon  examinations and flexible sigmoidoscopies. From what the patient relates,  she was referred to Dr. Loreta Ave in 2000 and had a negative colonoscopy, but I  do not have that report.   She has frequent liquid stools, but denies anorexia or weight loss.  Complains of rather typical acid reflux with burning substernal chest pain  and recent solid food dysphagia which has been progressive. She has not had  previous endoscopy. She recently, because of lower abdominal pain which is  rather constant in nature and located mostly in the left lower quadrant, had  transabdominal and transvaginal pelvic ultrasound performed on February 27, 2006, at [facility]. This showed a 1.5-cm fibroid tumor in the  posterior uterine body and some simple ovarian cysts. Mammograms were also  performed and were normal.  Pap smear was done and it was negative.   The patient follows a fairly regular diet and denies any specific food  intolerances.   PAST MEDICAL HISTORY:  Remarkable for:  Hypertension, hypercholesterolemia, anxiety and depression, chronic migraine  headaches, and sleep apnea. She has also had carpal tunnel surgery on her  right hand and has had bilateral knee surgeries and has degenerative  arthritis in her back.   MEDICATIONS:  At this time, is an unknown blood pressure medication.   She denies significant allergies.   FAMILY HISTORY:  Multiple colon cancer in an uncle. Her mother had ovarian  cancer and several family members have had breast cancer. She has a sister  and a grandmother with diabetes.   SOCIAL HISTORY:  She is widowed and lives alone. She smokes a pack of  cigarettes per day, but denies ethanol or illicit IV drug use.   REVIEW OF SYSTEMS:  Is almost completely positive in all categories. She  relates her last menstrual period was over a year ago. Some of her most  pertinent review of systems positive are alleged chronic insomnia, night  sweats, frequent urination, dry non-productive  cough, chronic back pain,  severe fatigue, and shortness of breath with exertion. She complains of  worsening depression and has had a history of anxiety and panic attacks.   PHYSICAL EXAMINATION:  Examination shows her to be a healthy-appearing black  female appearing her stated age in no acute distress. She is 4'11 tall.  Weight is 266 pounds. Vital signs were not recorded on her record today. I  could not appreciate stigmata of chronic liver disease or thyromegaly.  CHEST: Was generally clear to percussion and auscultation.  Could not appreciate murmurs, gallops or rubs. She appeared to be in a  regular rhythm.  ABDOMEN: Showed a well-healed large midline abdominal scar. There was no  organomegaly, masses. There was tenderness in the left lower quadrant area.  Bowel sounds were normal.  EXTREMITIES:  Peripheral extremities  were unremarkable.  MENTAL STATUS: Was clear.  RECTAL: Showed no masses or tenderness with guaiac negative stool.   ASSESSMENT:  1. Status-post subtotal colectomy for two synchronous carcinomas with      evidence of small metastases intra-abdominally at the time of her      surgery in March of 1993. Probably likely that she has recurrent intra-      abdominal carcinoma at this time.  2. Subtotal colectomy with frequent diarrhea associated with such.  3. 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  4. Degenerative arthritis with chronic low back pain.  5. History of uterine fibroids and ovarian cysts.  6. History of essential hypertension.  7. Strong family history of colon cancer.  8. History of chronic anxiety and depression, which is untreated at this      time.   RECOMMENDATIONS:  1. Check screening laboratory parameters including CEA level.  2. Reflux regime along with AcipHex 20 mg q a.m. 30 minutes before first      meal of the day.  3. Outpatient endoscopy and flexible sigmoidoscopy.  4. Abdominal CT scan.  5. P.r.n. Darvocet N100 every 6 hours until workup completed.  6. Continue antihypertensive medication and she is to bring this on return      to clinic so we can record which medication she is on at this time.       Vania Rea. Jarold Motto, MD, Clementeen Graham, [place]  DRP/MedQ  DD:  03/16/2006  DT:  03/17/2006  Job #:  [id number]   cc:   Currie Paris, M.D.  Charles A. Clearance Coots, M.D.

## 2010-10-25 NOTE — H&P (Signed)
NAMEAISLEE, LANDGREN               ACCOUNT NO.:  [id number]   MEDICAL RECORD NO.:  [id number]          PATIENT TYPE:  INP   LOCATION:  1304                         FACILITY:  [facility]   PHYSICIAN:  Fleet Contras, M.D.    DATE OF BIRTH:  April 11, 1956   DATE OF ADMISSION:  02/16/2005  DATE OF DISCHARGE:                                HISTORY & PHYSICAL   PRESENTATION:  Mrs. Devora is a 55 year old African-American lady with past  medical history significant for tobacco-related bronchitis, history of colon  cancer, gastroesophageal reflux disease. She presented to the emergency room  at [facility] with 1 week of progressively worsening cold and  cough. She had a recent sinus infection about a month ago which was treated  as an outpatient with Zithromax. She states her symptoms did not completely  clear but she developed a cold and cough behind that. The cough is dry,  nonproductive, without hemoptysis, and associated with a progressive element  of wheezing and rhonchi. She now aches in her chest and abdomen because of  the persistent cough. She denies any shortness of breath, orthopnea, or PND.  She has had no fevers or chills.   PAST MEDICAL HISTORY:  1.  History of colon cancer at age 47.  2.  Gastroesophageal reflux disease.  3.  Allergic rhinitis.   PAST SURGICAL HISTORY:  Colon surgery.   MEDICATION HISTORY:  She is on:  1.  Nexium 40 mg once a day.  2.  Zyrtec 10 mg daily.  3.  Ibuprofen over-the-counter p.r.n.  4.  Hydrochlorothiazide 12.5 mg daily.  5.  Albuterol MDI two puffs q.6h. p.r.n.   ALLERGIES:  She has no known drug allergies.   FAMILY AND SOCIAL HISTORY:  She is married, lives with family. She smokes  one pack of cigarettes every day. Denies any use of alcohol or illicit  drugs.   REVIEW OF SYSTEMS:  Essentially as above. GI:  She has no abdominal pain,  diarrhea, vomiting, or constipation. GU:  No dysuria, frequency, or  hematuria.   PHYSICAL  EXAMINATION:  GENERAL:  Ms. Allcorn is lying supine in bed, not in  acute respiratory or clinical distress. She has audible wheezes.  VITAL SIGNS:  Show blood pressure of 114/53, heart rate of 68, respiratory  rate is 20, temperature 98.2, O2 saturations on room air 96%.  HEENT:  She is alert and oriented x3. Her pupils are equal and reactive to  light and accommodation. She is not cyanotic. She is not icteric. She is not  dehydrated.  NECK:  Supple with no evidence of JVD.  CHEST:  Shows good air entry bilaterally, diffuse wheezes and rhonchi, no  rales.  ABDOMEN:  Obese, soft, nontender, no masses. Bowel sounds are present.  EXTREMITIES:  Shows no edema, no ulceration, no calf tenderness or swelling.  CENTRAL NERVOUS SYSTEM:  She is alert and oriented x3 with no focal  neurological deficits.   LABORATORY DATA:  Chest x-ray is reported to be negative.   ASSESSMENT:  Mrs. Bhullar is a 55 year old African-American lady with past  medical history  significant for tobacco-related bronchitis. She presents  with persistent cough and wheeze for the past week. She has received  portable nebulized albuterol in the emergency room without significant  improvement and she is being hospitalized for further treatment and  evaluation.   ADMITTING DIAGNOSES:  1.  Acute exacerbation of bronchitis/asthma.  2.  Tobacco abuse.   PLAN OF CARE:  She will be placed on a regular bed. Vital signs q.4h. IV  Solu-Medrol 50 mg q.6h. Albuterol 2.5 mg and Atrovent hand-held nebulizer  q.4h. IV Rocephin 1 g daily. IV Zithromax 450 mg daily. Protonix 40 mg  daily. Tussionex 5 mL b.i.d. for cough. Lovenox 40 mg subcu daily for DVT  prophylaxis. Further laboratory workup will include a CBC and CMET in a.m.  The plan of care has been discussed with her and all her questions have been  answered.      Fleet Contras, M.D.  Electronically Signed     EA/MEDQ  D:  02/17/2005  T:  02/17/2005  Job:  [id number]

## 2010-10-25 NOTE — Op Note (Signed)
Brooke [name], Brooke [name]               ACCOUNT NO.:  [id number]   MEDICAL RECORD NO.:  [id number]          PATIENT TYPE:  AMB   LOCATION:  [facility]                         FACILITY:  [facility]   PHYSICIAN:  Jene Every, M.D.    DATE OF BIRTH:  July 16, 1955   DATE OF PROCEDURE:  06/12/2004  DATE OF DISCHARGE:                                 OPERATIVE REPORT   PREOPERATIVE DIAGNOSES:  1.  Medial meniscus tear.  2.  Degenerative joint disease, left knee.   POSTOPERATIVE DIAGNOSES:  1.  Medial meniscus tear.  2.  Grade 3 chondromalacia medial femoral condyle, medial tibial plateau,      lateral tibial plateau.   PROCEDURE PERFORMED:  1.  Left knee arthroscopy.  2.  Partial medial meniscectomy.  3.  Chondroplasty medial femoral condyle, medial and lateral tibial plateau.   ANESTHESIA:  General.   SURGEON:  Javier Docker, M.D.   ASSISTANT:  None.   BRIEF HISTORY AND INDICATIONS:  A 55 year old with knee pain, locking,  giving way, MRI indicating degenerative changes, possible meniscal tear.  Operative intervention is indicated for diagnosis and treatment.  Risks and  benefits discussed including bleeding, infection, damage to neurovascular  structures, suboptimal range of motion, DVT, PE, anesthetic complications,  persistent pain.   TECHNIQUE:  The patient in supine position.  After the induction of adequate  general anesthesia, 1 g of Kefzol, the left lower extremity was prepped and  draped in the usual sterile fashion.  A lateral parapatellar portal was  fashioned with a #11 blade.  Ingress cannula atraumatically placed.  Irrigant was utilized to insufflate the joint.  Under direct visualization,  a medial parapatellar portal was fashioned with a #11 blade after  localization with an 18 gauge needle, sparing the medial meniscus.  Examination of the suprapatellar pouch revealed normal patellofemoral  tracking.  There was moderate grade 2 change at the patella.  The medial  compartment revealed extensive grade 3 changes of the femoral condyle,  tibial plateau, and a complex tear of the posterior horn of the medial  meniscus.  A basket rongeur was introduced and utilized to perform a partial  medial meniscectomy to a stable base posteriorly, further contoured with a  4.2 coude shaver with the residual loose cartilage removed.  Chondroplasty  of the femoral condyle and the tibial plateau to a stable base were  performed.  Residual remnant posteriorly of the medial meniscus was stable  to probe palpation.  ACL and PCL were unremarkable.  Lateral compartment  revealed extensive grade 3 changes, some minor grade 4 changes of the tibial  plateau.  Collene Mares was introduced and utilized to perform a chondroplasty to  the stable base here.  Meniscus was stable to probe palpation without  evidence of a tear.  The femoral condyle demonstrated some moderate grade 2  changes.  The gutters were unremarkable, and knee was copiously lavaged, and  reexamined both the menisci.  The residuals were stable to probe palpation.  There was no further loose cartilaginous debris.  The knee was copiously  lavaged; all instrumentation was removed.  The  portals were closed with 4-0  nylon simple sutures.  Marcaine 0.25% with epinephrine was infiltrated in  the joint.  The wound was dressed sterilely, awoken without difficulty, and  transported to the recovery room in satisfactory condition.   The patient tolerated the procedure well with no known complications.     Trey Paula   JB/MEDQ  D:  06/12/2004  T:  06/12/2004  Job:  [id number]

## 2010-11-14 ENCOUNTER — Emergency Department (HOSPITAL_COMMUNITY)
Admission: EM | Admit: 2010-11-14 | Discharge: 2010-11-14 | Disposition: A | Discharge status: Home / Self Care | Payer: Medicaid Other | Attending: Emergency Medicine | Admitting: Emergency Medicine

## 2010-11-14 DIAGNOSIS — L600 Ingrowing nail: Secondary | ICD-10-CM | POA: Insufficient documentation

## 2010-11-14 DIAGNOSIS — J45909 Unspecified asthma, uncomplicated: Secondary | ICD-10-CM | POA: Insufficient documentation

## 2010-11-14 DIAGNOSIS — I10 Essential (primary) hypertension: Secondary | ICD-10-CM | POA: Insufficient documentation

## 2010-11-14 DIAGNOSIS — M069 Rheumatoid arthritis, unspecified: Secondary | ICD-10-CM | POA: Insufficient documentation

## 2010-12-16 IMAGING — CT CT HEAD W/O CM
1 series · 16 of 30 positions shown, 20 images · non-contrast
Comparison: Brain MRI 07/26/2008. Head CT 07/26/2008.

CLINICAL DATA: Headache and blurred vision.

CT HEAD WITHOUT CONTRAST
TECHNIQUE: Contiguous axial images were obtained from the base of
the skull through the vertex without contrast.

[Series 2: headseq 4.8 h45s · axial · 0.43mm/px · z∈[-640,-511]mm · 16 of 30 slices shown, 20 images]
[im 2/30  brain]
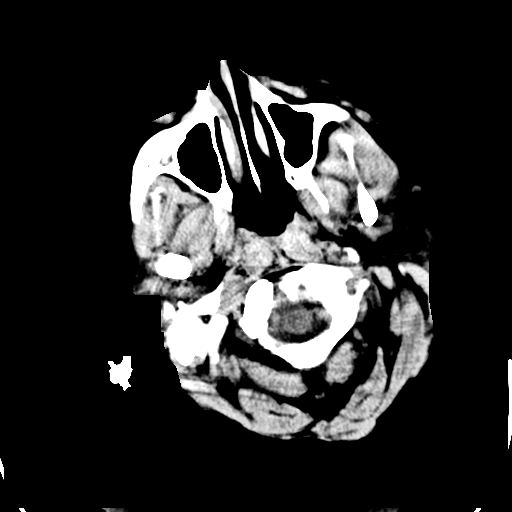
[im 2/30  bone]
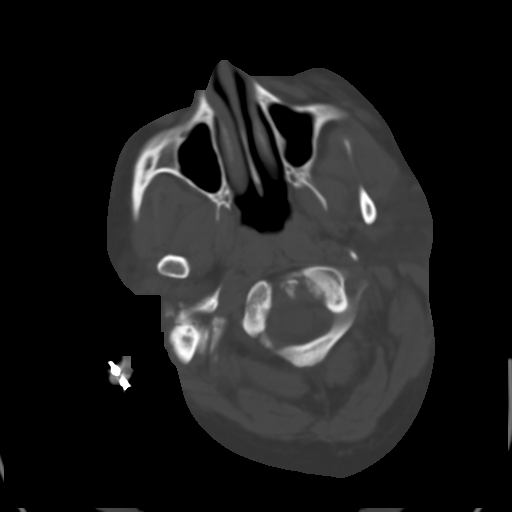
[im 4/30  brain]
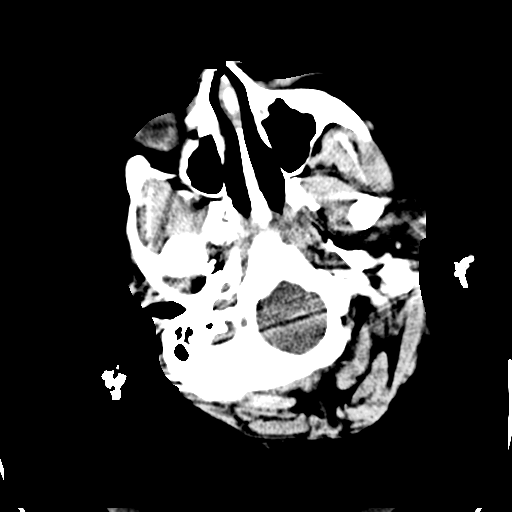
[im 6/30  brain]
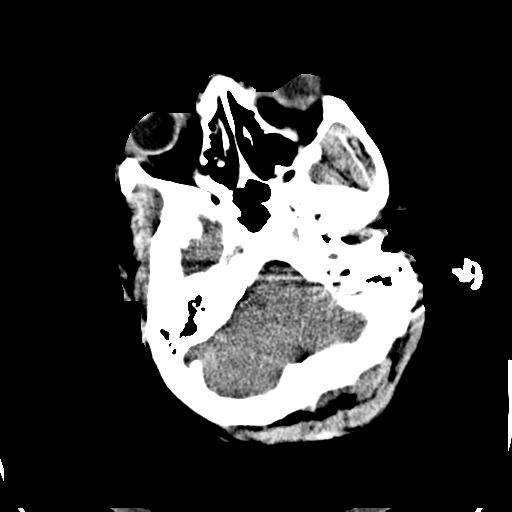
[im 8/30  brain]
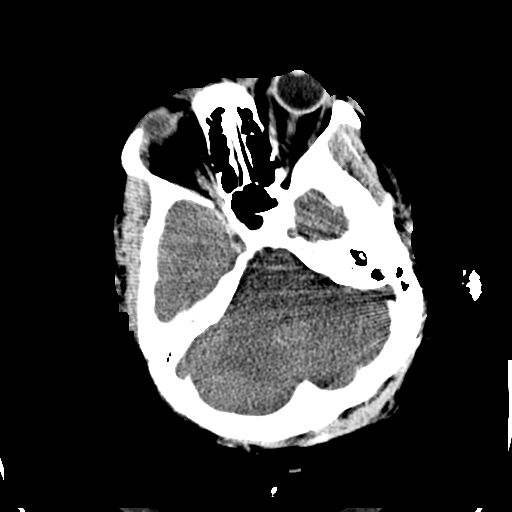
[im 9/30  brain]
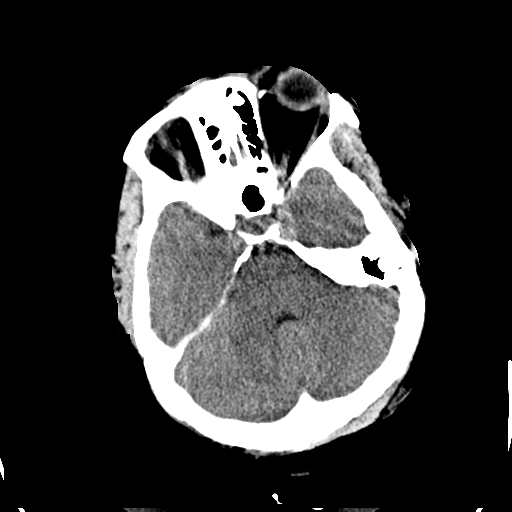
[im 9/30  bone]
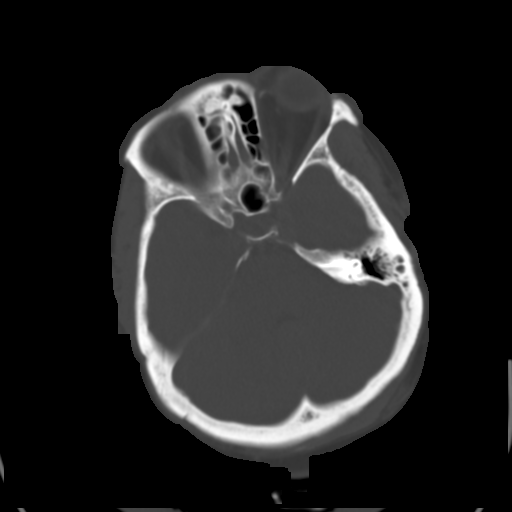
[im 11/30  brain]
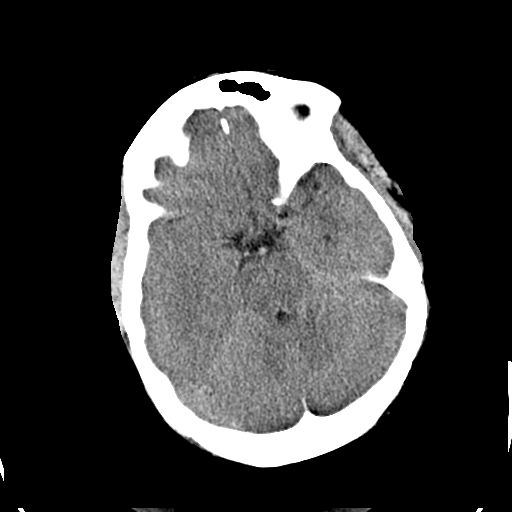
[im 13/30  brain]
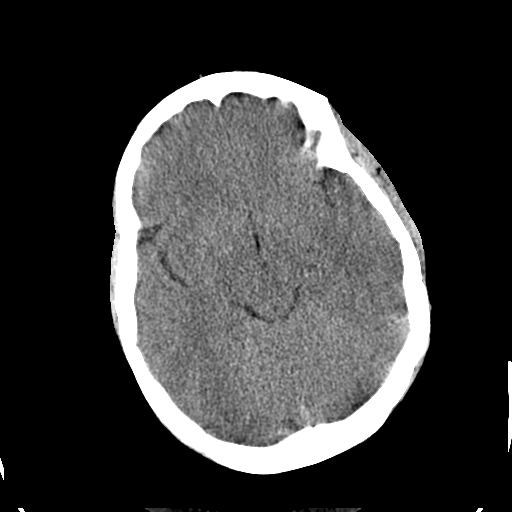
[im 15/30  brain]
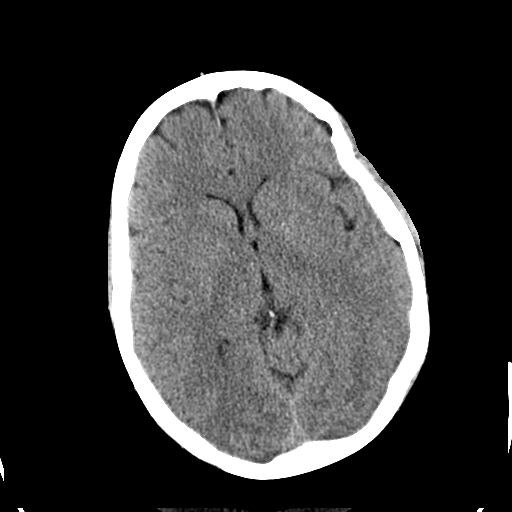
[im 16/30  brain]
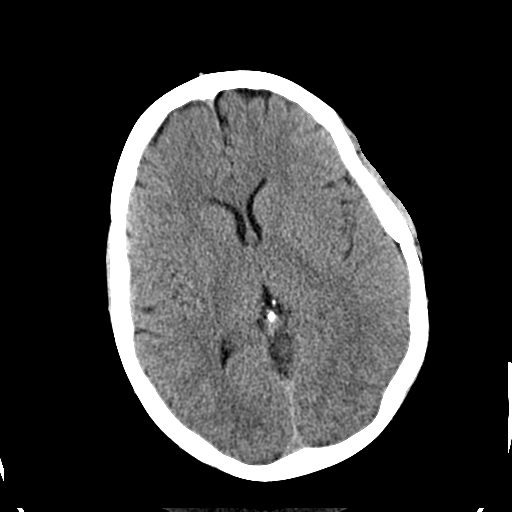
[im 16/30  bone]
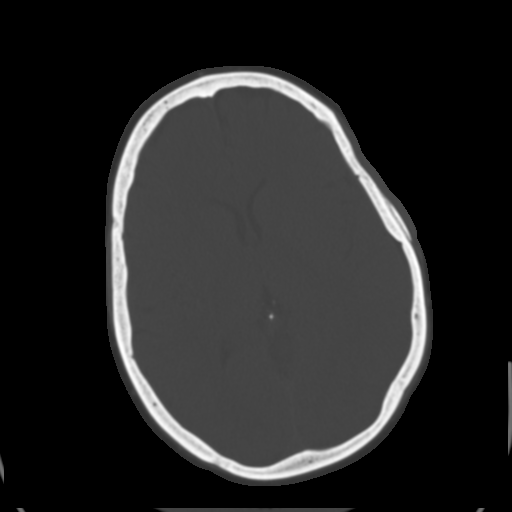
[im 18/30  brain]
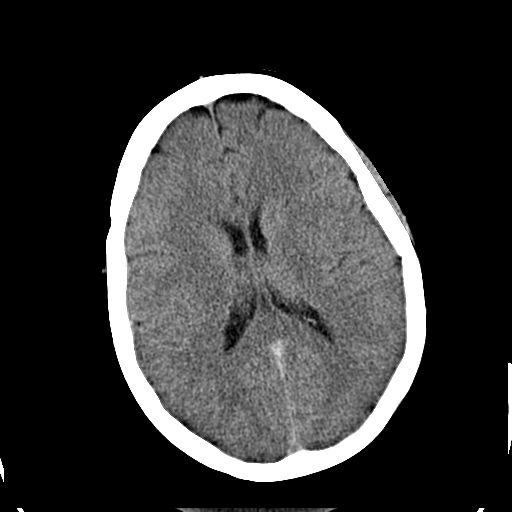
[im 20/30  brain]
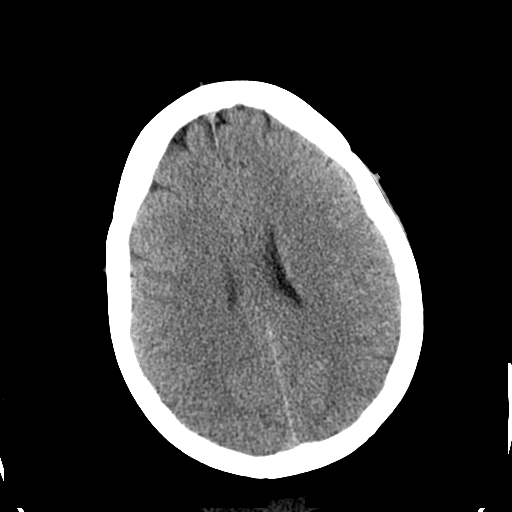
[im 22/30  brain]
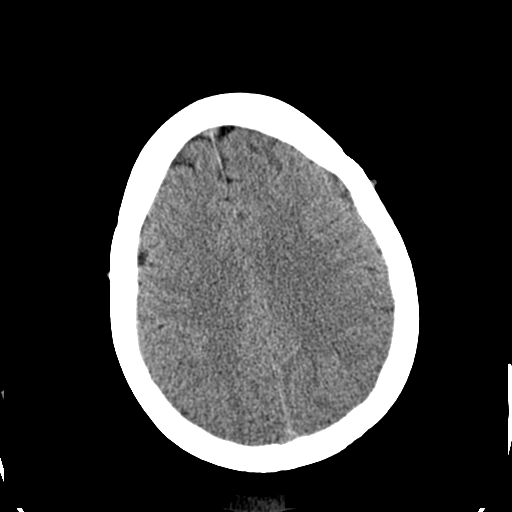
[im 23/30  brain]
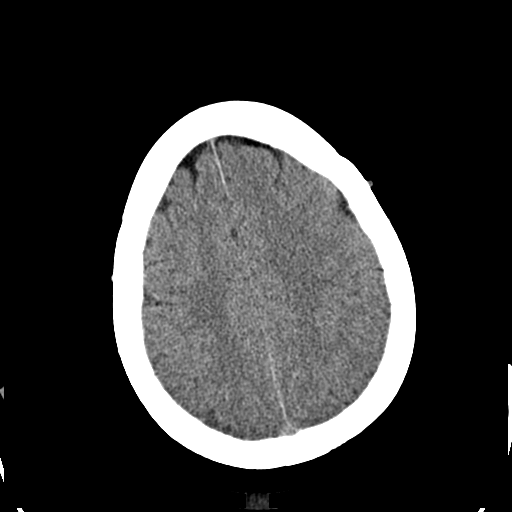
[im 23/30  bone]
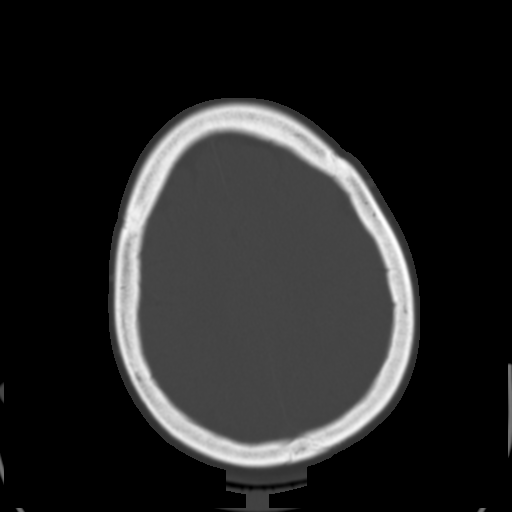
[im 25/30  brain]
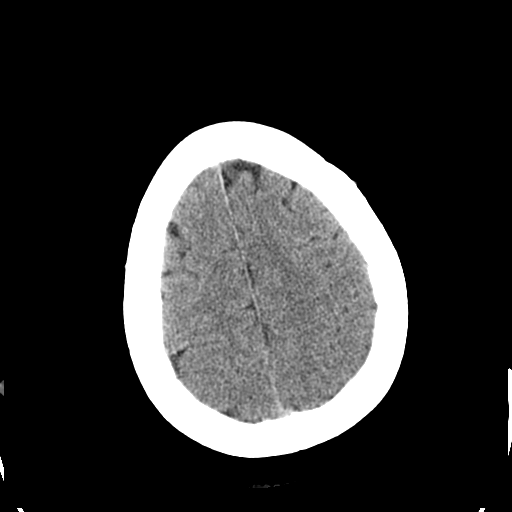
[im 27/30  brain]
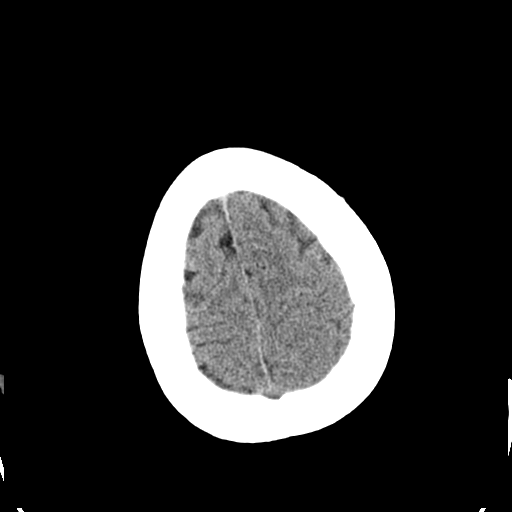
[im 29/30  brain]
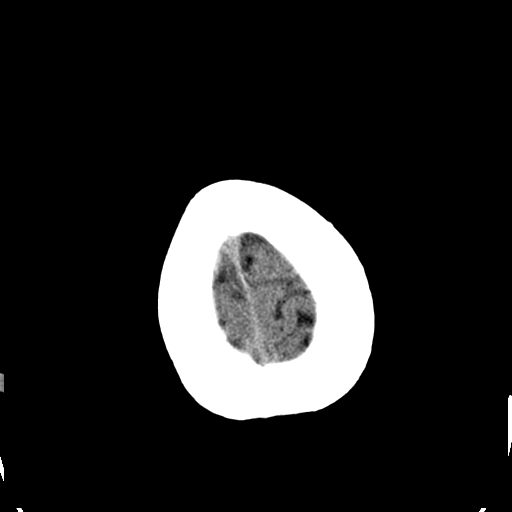

[16 of 30 positions shown; findings below may reference images not displayed]

FINDINGS: There is no evidence of acute intracranial abnormality
including acute infarction, hemorrhage, mass lesion, mass effect,
midline shift or abnormal extra-axial fluid collection.  No
hydrocephalus.  Calvarium is intact.  Imaged paranasal sinuses and
mastoid air cells are clear. Chiari 1 malformation seen on prior
brain MRI is not as well demonstrated with CT.
IMPRESSION: No acute finding.

## 2011-01-09 IMAGING — CR DG CHEST 2V
2 series · 2 of 2 positions shown · non-contrast
Comparison: 11/19/2008

CLINICAL DATA: Asthma, shortness of breath

CHEST - 2 VIEW

[w chest pa]
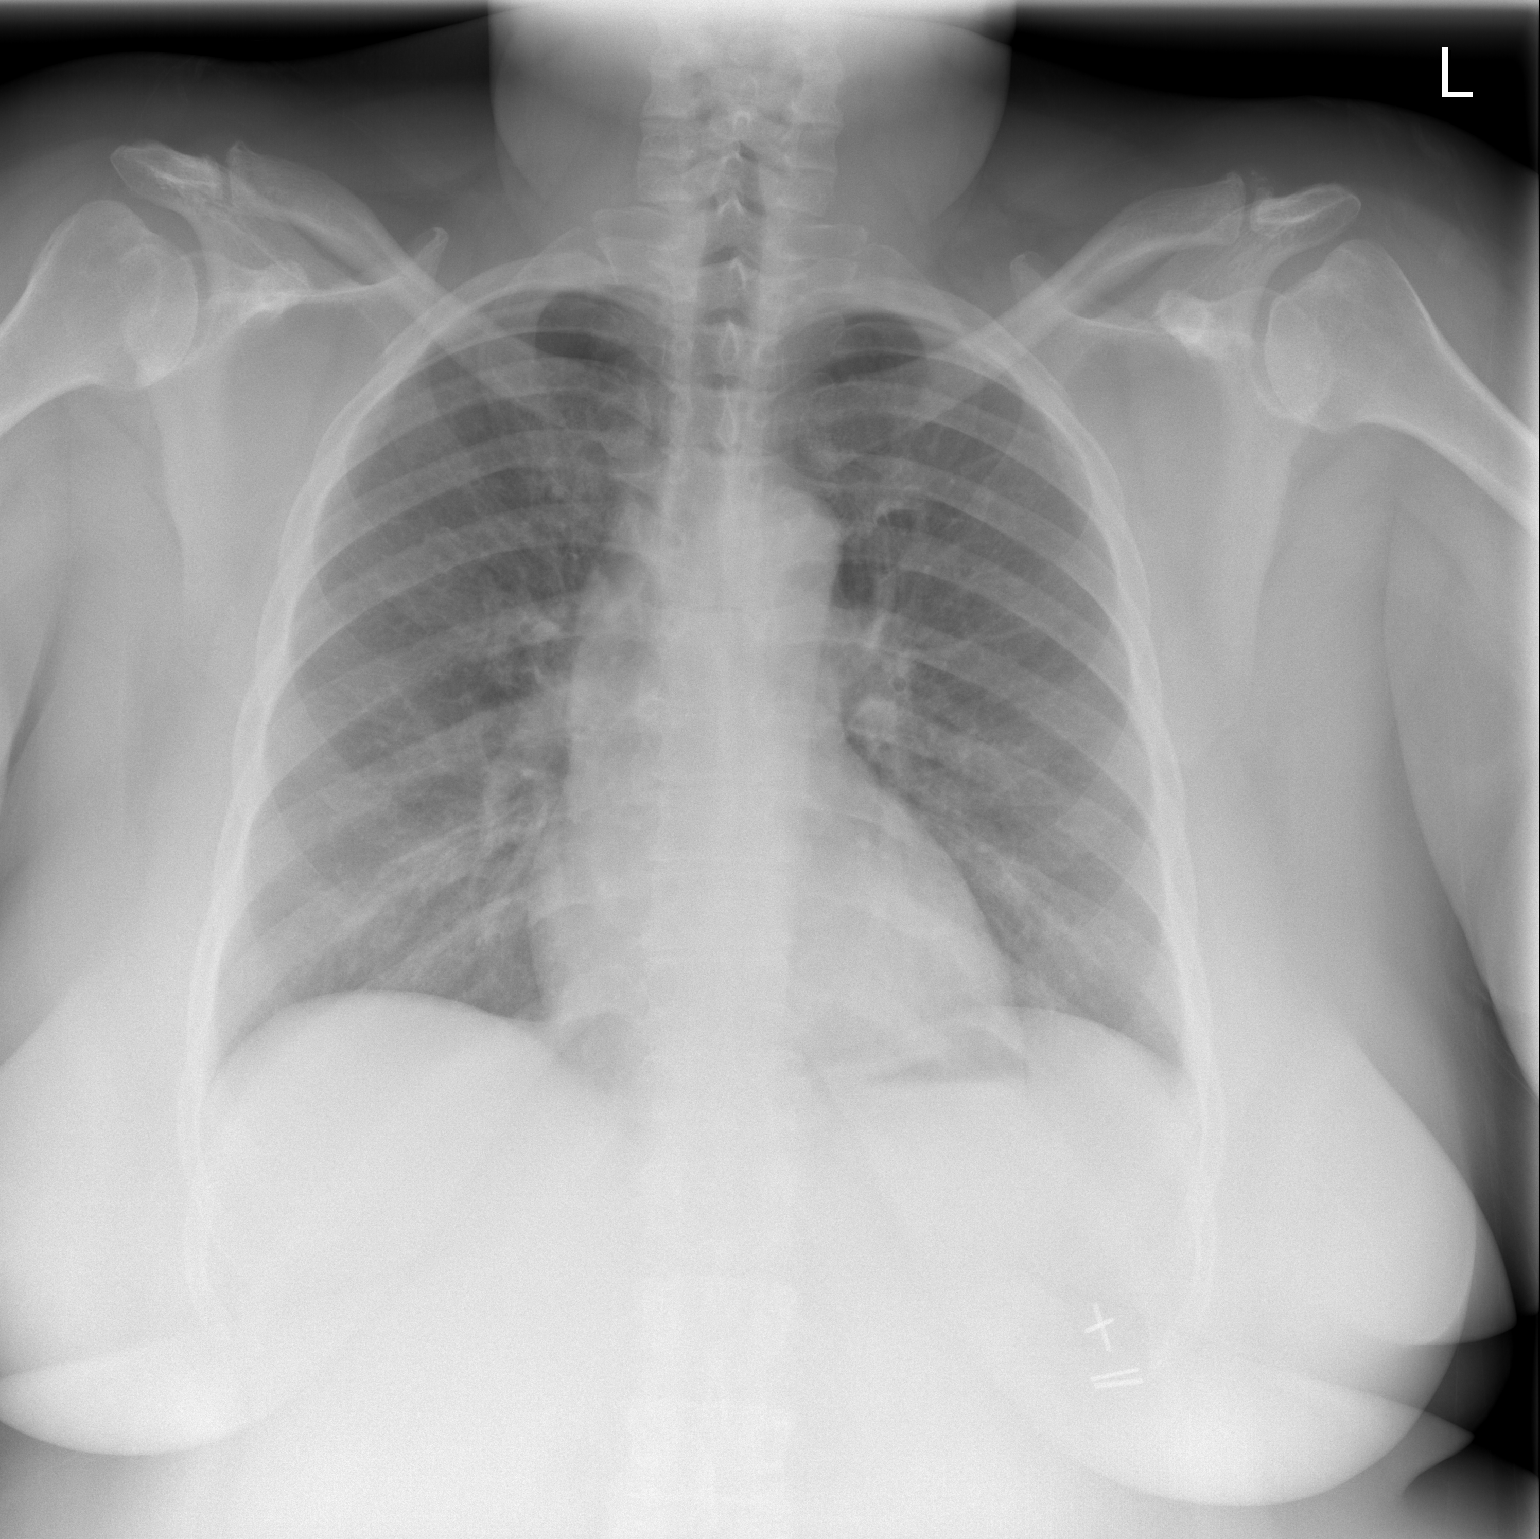

[w chest lat *]
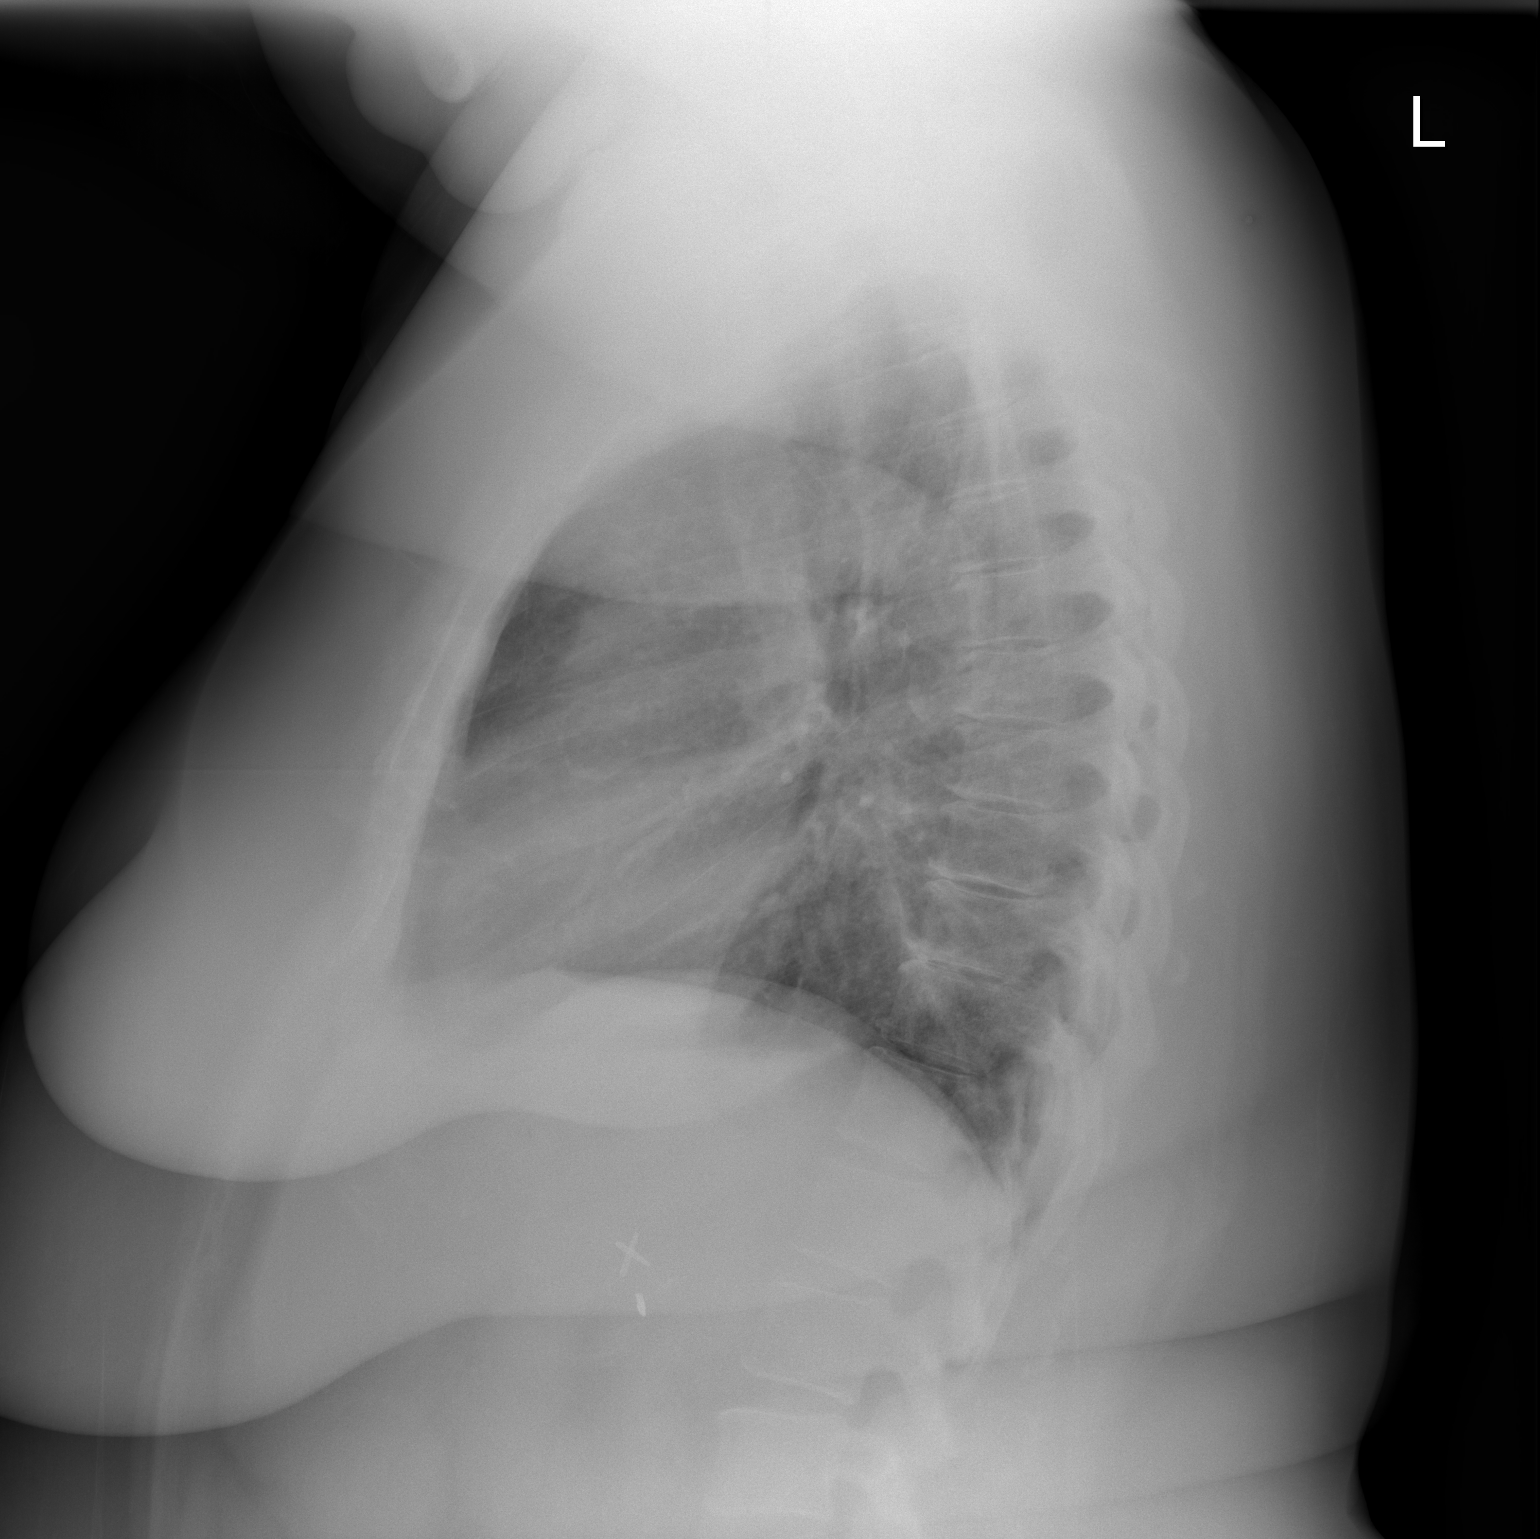

[2 of 2 positions shown; findings below may reference images not displayed]

FINDINGS: Upper normal heart size.
Normal mediastinal contours and pulmonary vascularity.
Peribronchial thickening without infiltrate or effusion.
No pneumothorax.
No acute bony findings.
IMPRESSION: Peribronchial thickening, which may be related to bronchitis or
asthma.

## 2011-03-03 LAB — CARDIAC PANEL(CRET KIN+CKTOT+MB+TROPI)
CK, MB: 2.3
CK, MB: 2.3
Relative Index: 1.2
Relative Index: 1.2
Total CK: 191 — ABNORMAL HIGH
Troponin I: 0.02

## 2011-03-03 LAB — CBC
HCT: 42.8
Hemoglobin: 14.6
MCHC: 34.2
MCV: 95.5
Platelets: 187
RBC: 4.48
RDW: 14.4
WBC: 6.2

## 2011-03-03 LAB — DIFFERENTIAL
Basophils Absolute: 0.1
Basophils Relative: 1
Eosinophils Absolute: 0.2
Eosinophils Relative: 4
Lymphocytes Relative: 45
Lymphs Abs: 2.8
Monocytes Absolute: 0.4
Monocytes Relative: 7
Neutro Abs: 2.7
Neutrophils Relative %: 43

## 2011-03-03 LAB — BASIC METABOLIC PANEL
BUN: 10
Chloride: 101
Creatinine, Ser: 0.74
GFR calc Af Amer: >60
GFR calc non Af Amer: >60

## 2011-03-03 LAB — POCT CARDIAC MARKERS
CKMB, poc: 1.2
Myoglobin, poc: 56.2
Operator id: 3067
Troponin i, poc: <0.05

## 2011-03-03 LAB — D-DIMER, QUANTITATIVE: D-Dimer, Quant: 0.33

## 2011-03-03 LAB — CK TOTAL AND CKMB (NOT AT ARMC): Total CK: 239 — ABNORMAL HIGH

## 2011-03-11 LAB — CBC
HCT: 41
HCT: 41.2
Hemoglobin: 13.5
MCHC: 32.9
MCHC: 33
MCHC: 33.4
MCV: 100
MCV: 100.7 — ABNORMAL HIGH
MCV: 98.4
RBC: 4.05
RBC: 4.1
RBC: 4.19
RDW: 14.5
WBC: 7.9

## 2011-03-11 LAB — BASIC METABOLIC PANEL
BUN: 10
CO2: 24
CO2: 28
Chloride: 106
Chloride: 106
Creatinine, Ser: 0.77
GFR calc Af Amer: >60
GFR calc Af Amer: >60
Glucose, Bld: 121 — ABNORMAL HIGH
Potassium: 3.1 — ABNORMAL LOW
Potassium: 4.9
Sodium: 139

## 2011-03-11 LAB — COMPREHENSIVE METABOLIC PANEL
AST: 29
CO2: 30
Calcium: 8.8
Creatinine, Ser: 0.78
GFR calc Af Amer: >60
GFR calc non Af Amer: >60
Total Protein: 6.8

## 2011-03-11 LAB — SEDIMENTATION RATE: Sed Rate: 16

## 2011-03-11 LAB — DIFFERENTIAL
Basophils Relative: 1
Eosinophils Absolute: 0.2
Eosinophils Relative: 2
Lymphs Abs: 3.6
Monocytes Relative: 9

## 2011-03-11 LAB — LIPASE, BLOOD: Lipase: 15

## 2011-03-11 LAB — BLOOD GAS, ARTERIAL
Acid-Base Excess: 2.9 — ABNORMAL HIGH
Bicarbonate: 27.6 — ABNORMAL HIGH
Patient temperature: 98.6
TCO2: 24.3
pH, Arterial: 7.408 — ABNORMAL HIGH

## 2011-03-21 LAB — D-DIMER, QUANTITATIVE: D-Dimer, Quant: 0.41

## 2011-04-06 ENCOUNTER — Ambulatory Visit (HOSPITAL_BASED_OUTPATIENT_CLINIC_OR_DEPARTMENT_OTHER): Payer: Medicaid Other | Attending: Anesthesiology

## 2011-04-06 DIAGNOSIS — G4733 Obstructive sleep apnea (adult) (pediatric): Secondary | ICD-10-CM | POA: Insufficient documentation

## 2011-04-06 DIAGNOSIS — I4949 Other premature depolarization: Secondary | ICD-10-CM | POA: Insufficient documentation

## 2011-04-12 DIAGNOSIS — I4949 Other premature depolarization: Secondary | ICD-10-CM

## 2011-04-12 DIAGNOSIS — G4733 Obstructive sleep apnea (adult) (pediatric): Secondary | ICD-10-CM

## 2011-04-14 NOTE — Procedures (Signed)
Brooke [name], Brooke [name]               ACCOUNT NO.:  [id number]  MEDICAL RECORD NO.:  [id number]          PATIENT TYPE:  OUT  LOCATION:  SLEEP CENTER                 FACILITY:  [facility]  PHYSICIAN:  Clinton D. Maple Hudson, MD, FCCP, FACPDATE OF BIRTH:  1955/10/18  DATE OF STUDY:  04/06/2011                           NOCTURNAL POLYSOMNOGRAM  REFERRING PHYSICIAN:  Tonye Royalty, MD  REFERRING PHYSICIAN:  Tonye Royalty, MD  INDICATION FOR STUDY:  Hypersomnia with sleep apnea.  EPWORTH SLEEPINESS SCORE:  16/24, BMI 53.1, weight 263 pounds, height 59 inches, neck 15 inches.  MEDICATIONS:  Home medications are charted and reviewed.  A previous diagnostic NPSG on Oct 30, 2003 had recorded an AHI of 13.2 per hour.  Weight was 261 pounds.  An NPSG on January 25, 2007 recorded AHI 7.2 per hour, body weight 260 pounds.  SLEEP ARCHITECTURE:  Total sleep time 413.5 minutes with sleep efficiency 93.7%.  Stage I was 10.3% ,stage II 83.8%,.  Stage III absent, REM 5.9% of total sleep time.  Sleep latency 0.5 minutes, REM latency 153 minutes awake after sleep onset 27.5 minutes.  Arousal index 13.9.  BEDTIME MEDICATION:  None.  RESPIRATORY DATA:  Apnea hypopnea index (AHI) 5.8 per hour.  A total of 40 events were scored, all were hypopneas, nonpositional REM.  REM AHI 14.7 per hour.  This is a diagnostic NPSG protocol as ordered.  OXYGEN DATA:  Moderately loud snoring with oxygen desaturation to a nadir of 89% and a mean oxygen saturation through the study of 95.7% on room air.  CARDIAC DATA:  Sinus rhythm with frequent PVCs.  MOVEMENT-PARASOMNIA:  No significant movement disturbance.  Bathroom x1.  IMPRESSION-RECOMMENDATION: 1. Minimal obstructive sleep apnea/hypopnea syndrome (AHI 5.8 per     hour).  The normal range for adults is from 0-5 events per hour.     Moderately loud snoring with oxygen desaturation to a nadir of 89%     and a mean oxygen saturation through the study of  95.7% on room     air. 2. Previous diagnostic NPSGs were noted.  On Oct 30, 2003,     apnea/hypopnea index 13.2 per hour, body weight 261 pounds.  On     January 25, 2007,  apnea/hypopnea index 7.2 per hour with body     weight 260 pounds.     Clinton D. Maple Hudson, MD, [facility], FACP Diplomate, Biomedical engineer of Sleep Medicine Electronically Signed    CDY/MEDQ  D:  04/12/2011 09:27:58  T:  04/12/2011 20:22:37  Job:  [id number]

## 2011-04-29 ENCOUNTER — Emergency Department (HOSPITAL_COMMUNITY)
Admission: EM | Admit: 2011-04-29 | Discharge: 2011-04-29 | Disposition: A | Discharge status: Home / Self Care | Payer: Medicaid Other | Attending: Emergency Medicine | Admitting: Emergency Medicine

## 2011-04-29 DIAGNOSIS — M25519 Pain in unspecified shoulder: Secondary | ICD-10-CM | POA: Insufficient documentation

## 2011-04-29 DIAGNOSIS — I10 Essential (primary) hypertension: Secondary | ICD-10-CM | POA: Insufficient documentation

## 2011-04-29 DIAGNOSIS — F17200 Nicotine dependence, unspecified, uncomplicated: Secondary | ICD-10-CM | POA: Insufficient documentation

## 2011-04-29 DIAGNOSIS — M549 Dorsalgia, unspecified: Secondary | ICD-10-CM | POA: Insufficient documentation

## 2011-04-29 DIAGNOSIS — M25569 Pain in unspecified knee: Secondary | ICD-10-CM | POA: Insufficient documentation

## 2011-04-29 DIAGNOSIS — G8929 Other chronic pain: Secondary | ICD-10-CM | POA: Insufficient documentation

## 2011-04-29 HISTORY — DX: Chronic obstructive pulmonary disease, unspecified: J44.9

## 2011-04-29 HISTORY — DX: Anxiety disorder, unspecified: F41.9

## 2011-04-29 HISTORY — DX: Malignant neoplasm of colon, unspecified: C18.9

## 2011-04-29 HISTORY — DX: Essential (primary) hypertension: I10

## 2011-04-29 HISTORY — DX: Unspecified osteoarthritis, unspecified site: M19.90

## 2011-04-29 NOTE — ED Notes (Signed)
Pt presents with no acute distress- denies injury- reports generalized arthritic pain  Greater to left shoulder, both knees, and legs and lower back

## 2011-04-29 NOTE — ED Provider Notes (Addendum)
History     CSN: [id number] Arrival date & time: 04/29/2011  9:07 AM   First MD Initiated Contact with Patient 04/29/11 0914      Chief Complaint  Patient presents with  . Shoulder Pain  . Back Pain    (Consider location/radiation/quality/duration/timing/severity/associated sxs/prior treatment) HPI Complains of bilateral knee pain and bilateral shoulder pain for 2 years worse over the past 2 months . Patient chronically on Percocet followed by pain clinic. Did not take any of her pain medication this morning. States Percocet does not adequately treat her pain. No fever no injury. Pain worse with walking or moving. Not made better by anything. Past Medical History  Diagnosis Date  . Arthritis   . Hypertension   . Asthma   . COPD (chronic obstructive pulmonary disease)   . Anxiety   . Cancer   . Colon cancer    chronic pain  History reviewed. No pertinent past surgical history.  History reviewed. No pertinent family history.  History  Substance Use Topics  . Smoking status: Current Everyday Smoker -- 1.0 packs/day  . Smokeless tobacco: Not on file  . Alcohol Use: No    OB History    Grav Para Term Preterm Abortions TAB SAB Ect Mult Living                  Review of Systems  Constitutional: Negative.   HENT: Negative.   Respiratory: Negative.   Cardiovascular: Negative.   Gastrointestinal: Negative.   Musculoskeletal: Positive for arthralgias.  Skin: Negative.   Neurological: Negative.   Hematological: Negative.   Psychiatric/Behavioral: Negative.     Allergies  Review of patient's allergies indicates no known allergies.  Home Medications  No current outpatient prescriptions on file.  BP 118/74  Pulse 83  Temp(Src) 98 F (36.7 C) (Oral)  Resp 18  SpO2 99%  Physical Exam  Nursing note and vitals reviewed. Constitutional: She appears well-developed and well-nourished.  HENT:  Head: Normocephalic and atraumatic.  Eyes: Conjunctivae are normal.  Pupils are equal, round, and reactive to light.  Neck: Neck supple. No tracheal deviation present. No thyromegaly present.  Cardiovascular: Normal rate and regular rhythm.   No murmur heard. Pulmonary/Chest: Effort normal and breath sounds normal.  Abdominal: Soft. Bowel sounds are normal. She exhibits no distension. There is no tenderness.       Morbidly obese  Musculoskeletal: Normal range of motion. She exhibits no edema and no tenderness.       All for service will redness swelling or point tenderness neurovascularly intact. Pain with walking at bilateral knees  Neurological: She is alert. Coordination normal.  Skin: Skin is warm and dry. No rash noted.  Psychiatric: She has a normal mood and affect.   Gait normal ED Course  Procedures (including critical care time)  Labs Reviewed - No data to display No results found.   No diagnosis found.    MDM  I explained to patient she needs to call her pain clinic today to explain that Percocet is not adequately treating her pain. No further treatment rendered Diagnosis chronic pain        Doug Sou, MD 04/29/11 1478  Doug Sou, MD 04/29/11 1743

## 2011-05-02 IMAGING — CR DG CHEST 2V
2 series · 2 of 2 positions shown · non-contrast
Comparison: 04/30/2009.

CLINICAL DATA: Cough and short of breath.  Wheezing.

CHEST - 2 VIEW

[w chest pa]
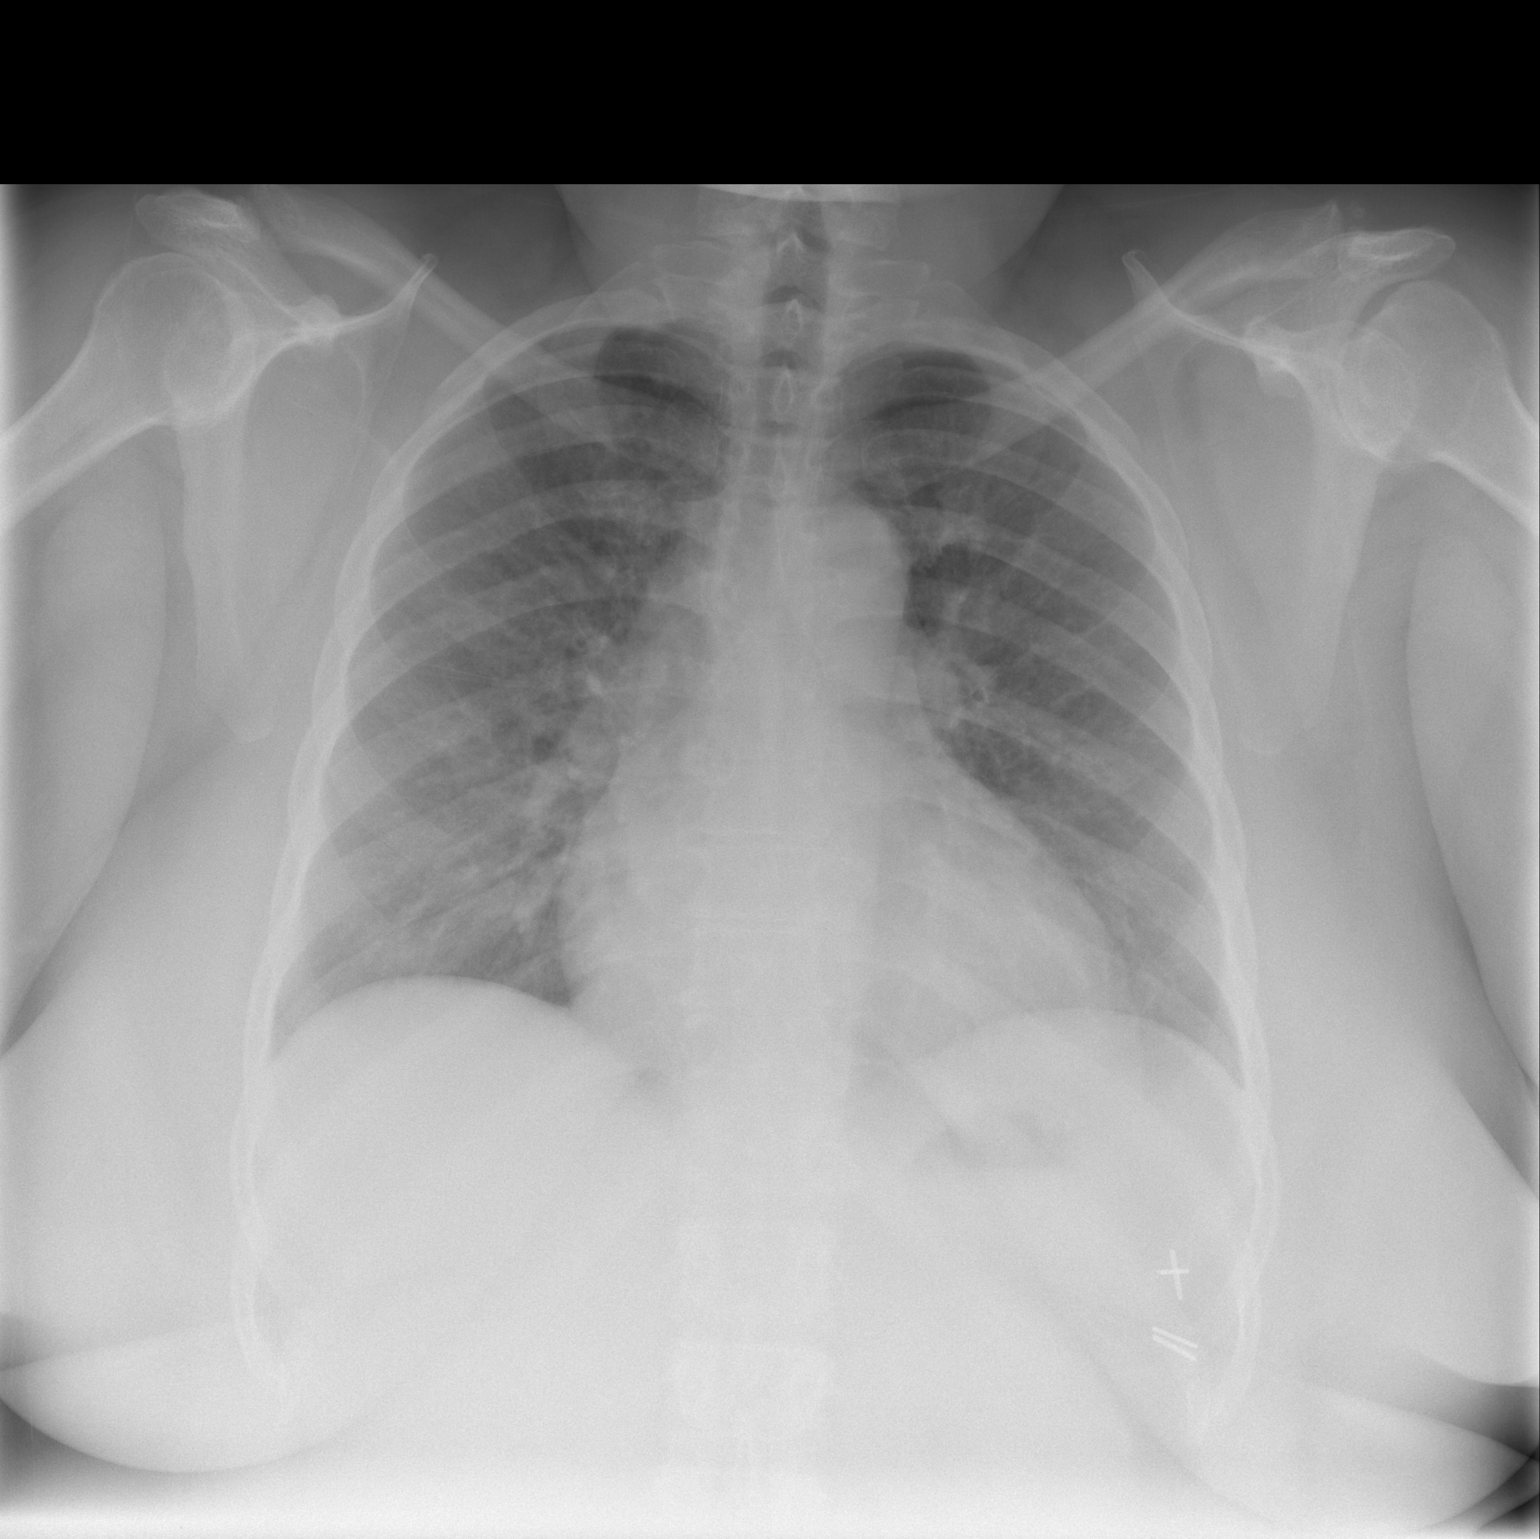

[w chest lat]
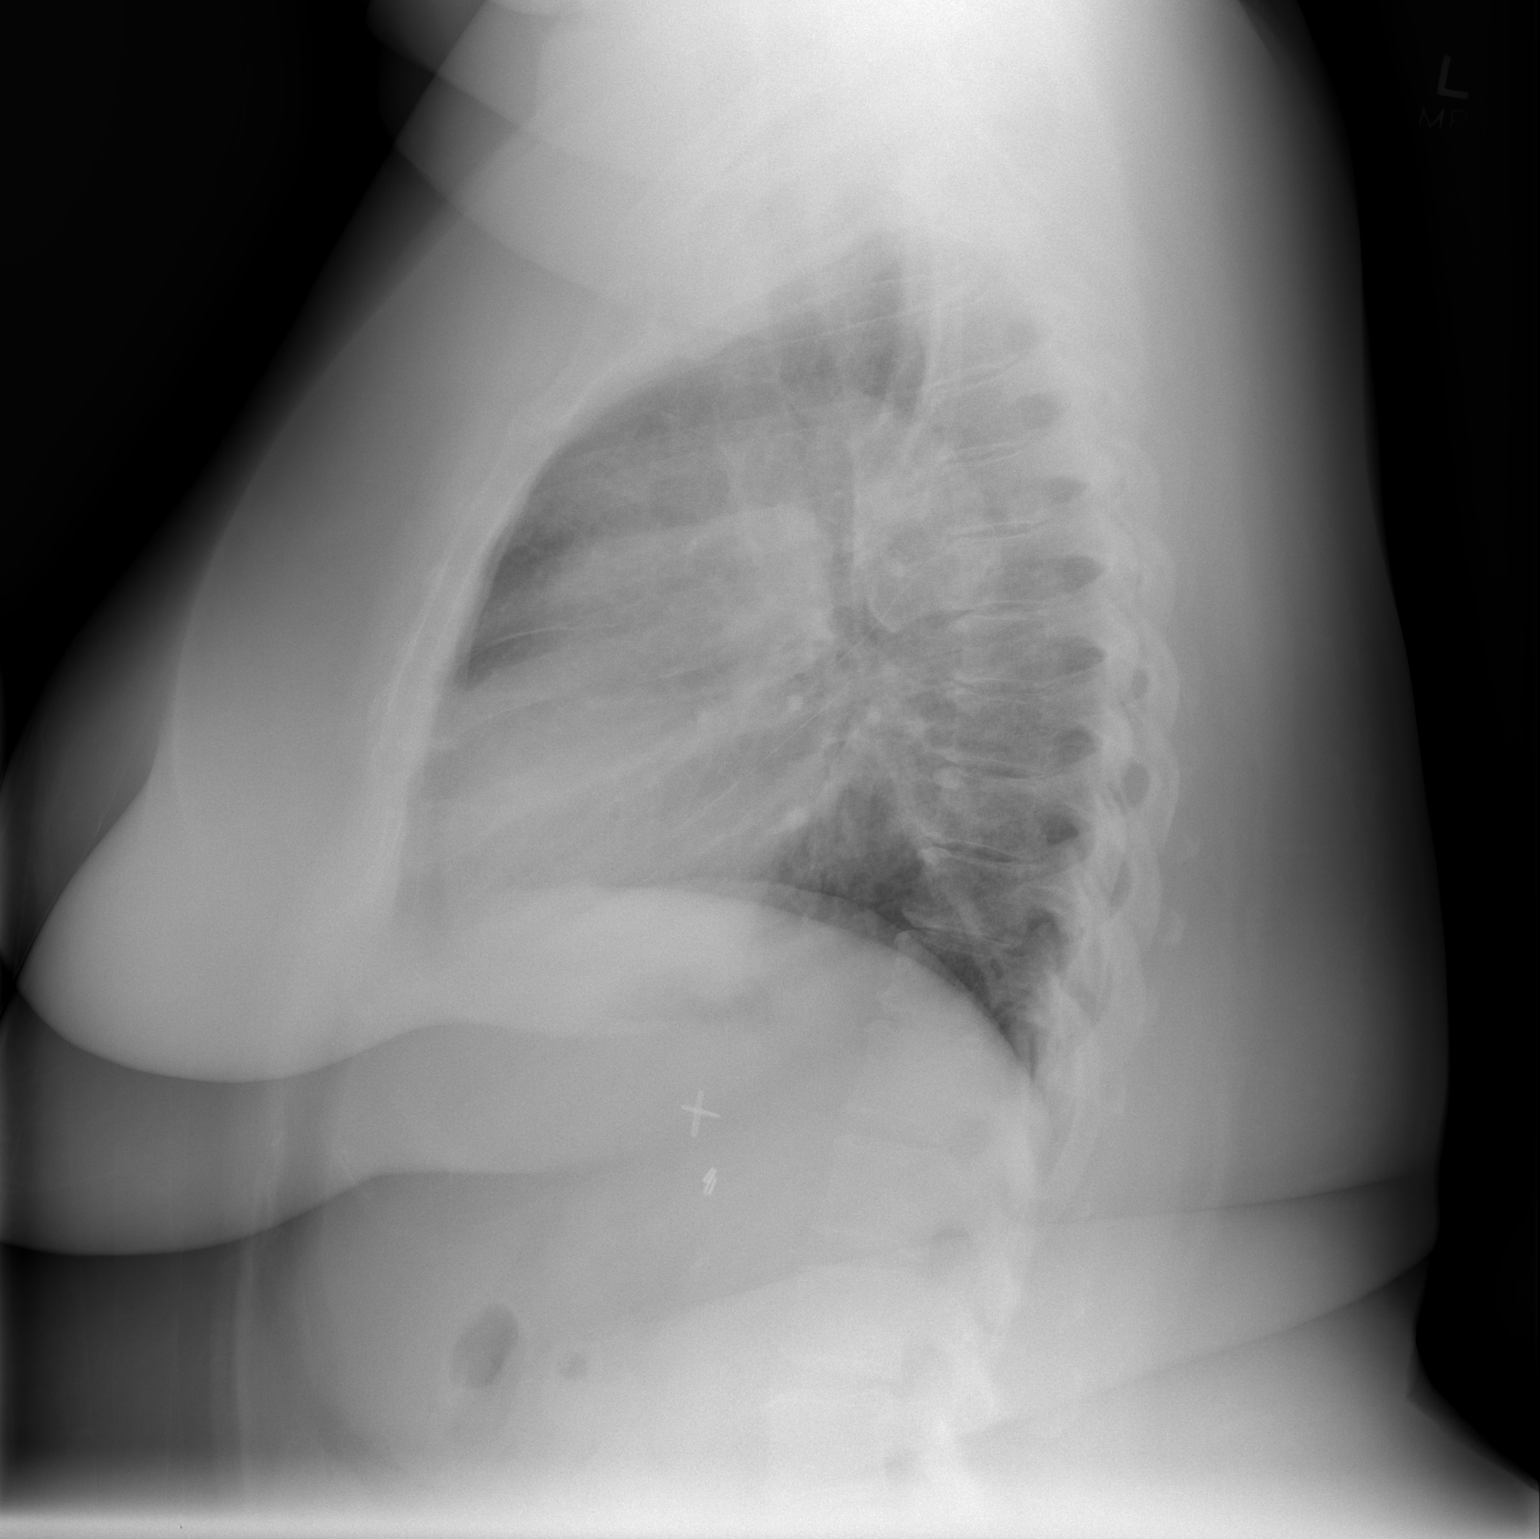

[2 of 2 positions shown; findings below may reference images not displayed]

FINDINGS: Heart size is mildly enlarged.  There is no heart
failure.  The lungs are clear without infiltrate or effusion.
IMPRESSION: No acute cardiopulmonary disease.

## 2011-06-10 DIAGNOSIS — I493 Ventricular premature depolarization: Secondary | ICD-10-CM

## 2011-06-10 HISTORY — DX: Ventricular premature depolarization: I49.3

## 2011-06-11 DIAGNOSIS — M48061 Spinal stenosis, lumbar region without neurogenic claudication: Secondary | ICD-10-CM | POA: Diagnosis not present

## 2011-06-11 DIAGNOSIS — M1710 Unilateral primary osteoarthritis, unspecified knee: Secondary | ICD-10-CM | POA: Diagnosis not present

## 2011-06-12 DIAGNOSIS — G8929 Other chronic pain: Secondary | ICD-10-CM | POA: Diagnosis not present

## 2011-06-12 DIAGNOSIS — F411 Generalized anxiety disorder: Secondary | ICD-10-CM | POA: Diagnosis not present

## 2011-06-12 DIAGNOSIS — I10 Essential (primary) hypertension: Secondary | ICD-10-CM | POA: Diagnosis not present

## 2011-06-12 DIAGNOSIS — K219 Gastro-esophageal reflux disease without esophagitis: Secondary | ICD-10-CM | POA: Diagnosis not present

## 2011-06-19 ENCOUNTER — Ambulatory Visit (AMBULATORY_SURGERY_CENTER): Payer: Medicare Other | Admitting: *Deleted

## 2011-06-19 DIAGNOSIS — Z8500 Personal history of malignant neoplasm of unspecified digestive organ: Secondary | ICD-10-CM

## 2011-06-25 ENCOUNTER — Encounter: Payer: Self-pay | Admitting: Gastroenterology

## 2011-06-25 ENCOUNTER — Ambulatory Visit (AMBULATORY_SURGERY_CENTER): Payer: Medicare Other | Admitting: Gastroenterology

## 2011-06-25 VITALS — BP 117/57 | HR 61 | Temp 97.8°F | Resp 22 | Ht 59.0 in | Wt 247.0 lb

## 2011-06-25 DIAGNOSIS — Z85038 Personal history of other malignant neoplasm of large intestine: Secondary | ICD-10-CM | POA: Diagnosis not present

## 2011-06-25 DIAGNOSIS — Z8500 Personal history of malignant neoplasm of unspecified digestive organ: Secondary | ICD-10-CM | POA: Diagnosis not present

## 2011-06-25 DIAGNOSIS — E669 Obesity, unspecified: Secondary | ICD-10-CM | POA: Diagnosis not present

## 2011-06-25 MED ORDER — SODIUM CHLORIDE 0.9 % IV SOLN: 500.0000 mL | INTRAVENOUS | Status: DC

## 2011-06-25 NOTE — Progress Notes (Signed)
Patient did not experience any of the following events: a burn prior to discharge; a fall within the facility; wrong site/side/patient/procedure/implant event; or a hospital transfer or hospital admission upon discharge from the facility. (G8907) Patient did not have preoperative order for IV antibiotic SSI prophylaxis. (G8918)  

## 2011-06-25 NOTE — Op Note (Signed)
[facility] [address] [place], [place]  [address]  FLEXIBLE SIGMOIDOSCOPY PROCEDURE REPORT  PATIENT:  Brooke [name], Brooke [name]  MR#:  [id number] BIRTHDATE:  Oct 30, 1955, 55 yrs. old  GENDER:  female  ENDOSCOPIST:  Vania Rea. Jarold Motto, MD, [facility] Referred by:  Fleet Contras, M.D.  PROCEDURE DATE:  06/25/2011 PROCEDURE:  Flexible Sigmoidoscopy, diagnostic ASA CLASS:  Class II INDICATIONS:  history of cancer  MEDICATIONS:   propofol (Diprivan) 100 mg IV  DESCRIPTION OF PROCEDURE:   After the risks benefits and alternatives of the procedure were thoroughly explained, informed consent was obtained.  Digital rectal exam was performed and revealed no abnormalities.   The [id number] endoscope was introduced through the anus and advanced to the descending colon, limited by poor preparation.   The quality of the prep was adequate.  The instrument was then slowly withdrawn as the mucosa was fully examined. <<PROCEDUREIMAGES>>  There was a surgical anastomosis in the sigmoid colon. anastomosis at 20 cm level.NO POLYPS OR TUMORS.   Retroflexed views in the rectum revealed no abnormalities.    The scope was then withdrawn from the patient and the procedure terminated.  COMPLICATIONS:  None  ENDOSCOPIC IMPRESSION: 1) Anastomosis in the sigmoid colon SUBTOTAL COLECTOMY IN 1993 FOR COLON CANCER. RECOMMENDATIONS: OFFICE VISIT.  REPEAT EXAM:  No  ______________________________ Vania Rea. Jarold Motto, MD, [facility]  CC:  n. eSIGNED:   Vania Rea. Desirre Eickhoff at 06/25/2011 01:54 PM  Rich Brave, [id number]

## 2011-06-25 NOTE — Progress Notes (Signed)
Spoke with Dr. Jarold Motto regarding pt's bigmeminal rhythm. Inquired if he wanted labs drawn prior to discharge before cardiology appointment, no additional orders received other than for patient to follow up tomorrow with primary care provider Dr. Diamantina Providence. Informed patient and daughter and asked them to call Dr. Felecia Shelling office regarding irregular heartbeat, specifically every other beat irregularity.

## 2011-06-25 NOTE — Patient Instructions (Signed)
Green and blue discharge instructions reviewed with patient and care partner.  Impressions/recommendations:  Anastomosis in the sigmoid colon  Office visit to be scheduled by Dr. Norval Gable office.  Cardiology appointment scheduled with Dr. Rollene Rotunda January 31st at 400pm.  Resume medications as you had been taking them prior to your procedure.

## 2011-06-26 ENCOUNTER — Telehealth: Payer: Self-pay | Admitting: *Deleted

## 2011-06-26 NOTE — Telephone Encounter (Signed)

## 2011-07-03 ENCOUNTER — Telehealth: Payer: Self-pay | Admitting: *Deleted

## 2011-07-03 NOTE — Telephone Encounter (Signed)
lmom to schedule pt for f/u visit post FLEX SIG on 06/25/11.

## 2011-07-07 NOTE — Telephone Encounter (Signed)
Mailed pt a letter informing her of a f/u appt on 07/21/11 at 0900am.

## 2011-07-08 ENCOUNTER — Encounter: Payer: Self-pay | Admitting: Cardiology

## 2011-07-10 ENCOUNTER — Encounter: Payer: Self-pay | Admitting: Cardiology

## 2011-07-10 ENCOUNTER — Ambulatory Visit (INDEPENDENT_AMBULATORY_CARE_PROVIDER_SITE_OTHER): Payer: Medicare Other | Admitting: Cardiology

## 2011-07-10 DIAGNOSIS — I493 Ventricular premature depolarization: Secondary | ICD-10-CM | POA: Insufficient documentation

## 2011-07-10 DIAGNOSIS — I10 Essential (primary) hypertension: Secondary | ICD-10-CM

## 2011-07-10 DIAGNOSIS — I4949 Other premature depolarization: Secondary | ICD-10-CM | POA: Diagnosis not present

## 2011-07-10 DIAGNOSIS — E785 Hyperlipidemia, unspecified: Secondary | ICD-10-CM | POA: Diagnosis not present

## 2011-07-10 NOTE — Patient Instructions (Signed)
Continue current medications as listed.  Your physician has recommended that you wear a holter monitor for 24 hours. Holter monitors are medical devices that record the heart's electrical activity. Doctors most often use these monitors to diagnose arrhythmias. Arrhythmias are problems with the speed or rhythm of the heartbeat. The monitor is a small, portable device. You can wear one while you do your normal daily activities. This is usually used to diagnose what is causing palpitations/syncope (passing out).  Follow up will be based on the results of this testing

## 2011-07-10 NOTE — Assessment & Plan Note (Signed)
The patient has had PVCs but no symptoms related to these. This was at the time of colonoscopy apparently during recovery. I will place a 24-hour Holter monitor. However, I'm not suspecting that any further evaluation is warranted. Of note I would not suggest echocardiography as her images were very suboptimal in the past.

## 2011-07-10 NOTE — Assessment & Plan Note (Signed)
The patient is aware that this is a significant problem. However, her depression and joint pains will severely limit her ability to manage her weight.

## 2011-07-10 NOTE — Assessment & Plan Note (Signed)
Her blood pressure is controlled. She will continue the meds as listed. 

## 2011-07-10 NOTE — Progress Notes (Signed)
HPI The patient presents for evaluation of bigeminy. This was apparently noted at the time of colonoscopy. She has no history of arrhythmias. She has no prior cardiac history. I do note an echocardiogram in 2006 that demonstrated no significant abnormalities but was very poor wall motion. She is very limited in her activities. She says she is limited by joint pains and depression. With the activities that she does which includes occasionally going to the grocery store she does not report cardiovascular symptoms. She doesn't have presyncope or syncope. She doesn't have chest pressure, neck discomfort. She has some chronic dyspnea but no PND or orthopnea. She has no weight gain or edema.  She does have left arm pain but this is a soreness that is there all the time.  Allergies  Allergen Reactions  . Kiwi Extract Swelling    Current Outpatient Prescriptions  Medication Sig Dispense Refill  . ALPRAZolam (XANAX) 0.25 MG tablet Take 0.25 mg by mouth 2 (two) times daily. anxiety       . clonazePAM (KLONOPIN) 0.5 MG tablet Take 1 tablet by mouth Daily.      . CYMBALTA 60 MG capsule Take 1 tablet by mouth Daily.      Marland Kitchen FLUoxetine (PROZAC) 20 MG capsule Take 1 tablet by mouth Daily.      . hydrochlorothiazide (HYDRODIURIL) 25 MG tablet Take 25 mg by mouth daily.        Marland Kitchen ibuprofen (ADVIL,MOTRIN) 800 MG tablet Take 800 mg by mouth 2 (two) times daily.        . indomethacin (INDOCIN) 25 MG capsule Take 1 tablet by mouth Daily.      Marland Kitchen omeprazole (PRILOSEC) 40 MG capsule Take 40 mg by mouth daily.        Marland Kitchen oxyCODONE-acetaminophen (PERCOCET) 10-325 MG per tablet Take 1 tablet by mouth 2 (two) times daily.        . predniSONE (STERAPRED UNI-PAK) 5 MG TABS Take 1 tablet by mouth Daily.      . traZODone (DESYREL) 50 MG tablet Take 1 tablet by mouth Daily.       Current Facility-Administered Medications  Medication Dose Route Frequency Provider Last Rate Last Dose  . 0.9 %  sodium chloride infusion  500 mL  Intravenous Continuous Sheryn Bison, MD        Past Medical History  Diagnosis Date  . Arthritis   . Hypertension   . Asthma   . COPD (chronic obstructive pulmonary disease)   . Anxiety   . Cancer   . Colon cancer   . Dyslipidemia   . Osteoarthritis   . Migraine headache   . Chronic bronchitis   . Uterine fibroid   . Ovarian cyst   . GERD (gastroesophageal reflux disease)   . Morbid obesity   . Obstructive sleep apnea     mild    Past Surgical History  Procedure Date  . Colon surgery   . Knee arthroscopy     bil.  . Carpal tunnel release     rt  . Mouth surgery   . Cesarean section     Family History  Problem Relation Age of Onset  . Uterine cancer Mother   . Stroke Father   . Colon cancer Maternal Uncle   . Diabetes Father   . Ovarian cancer Mother   . Hypertension Father   . Breast cancer Maternal Grandmother   . Diabetes Sister   . Asthma Child   . Asthma Child  History   Social History  . Marital Status: Widowed    Spouse Name: N/A    Number of Children: N/A  . Years of Education: N/A   Occupational History  . Not on file.   Social History Main Topics  . Smoking status: Current Everyday Smoker -- 1.0 packs/day for 30 years    Types: Cigarettes  . Smokeless tobacco: Never Used  . Alcohol Use: No  . Drug Use: No  . Sexually Active:    Other Topics Concern  . Not on file   Social History Narrative  . No narrative on file    ROS:  As stated in the HPI and negative for all other systems.  PHYSICAL EXAM BP 129/81  Pulse 85  Ht 4\' 11"  (1.499 m)  Wt 251 lb (113.853 kg)  BMI 50.70 kg/m2 GENERAL:  Well appearing HEENT:  Pupils equal round and reactive, fundi not visualized, oral mucosa unremarkable NECK:  No jugular venous distention, waveform within normal limits, carotid upstroke brisk and symmetric, no bruits, no thyromegaly LYMPHATICS:  No cervical, inguinal adenopathy LUNGS:  Clear to auscultation bilaterally BACK:  No CVA  tenderness CHEST:  Unremarkable HEART:  PMI not displaced or sustained,S1 and S2 within normal limits, no S3, no S4, no clicks, no rubs, no murmurs ABD:  Flat, positive bowel sounds normal in frequency in pitch, no bruits, no rebound, no guarding, no midline pulsatile mass, no hepatomegaly, no splenomegaly, morbidly obese EXT:  2 plus pulses throughout, no edema, no cyanosis no clubbing SKIN:  No rashes no nodules NEURO:  Cranial nerves II through XII grossly intact, motor grossly intact throughout [facility]:  Cognitively intact, oriented to person place and time  EKG:  Sinus rhythm, rate 85, axis within normal limits, intervals within normal limits, no acute ST-T wave changes, possible RAE. 07/10/2011   ASSESSMENT AND PLAN

## 2011-07-14 ENCOUNTER — Encounter: Payer: Self-pay | Admitting: Internal Medicine

## 2011-07-21 ENCOUNTER — Ambulatory Visit (INDEPENDENT_AMBULATORY_CARE_PROVIDER_SITE_OTHER): Payer: Medicare Other | Admitting: Internal Medicine

## 2011-07-21 ENCOUNTER — Encounter: Payer: Self-pay | Admitting: Internal Medicine

## 2011-07-21 DIAGNOSIS — Z85038 Personal history of other malignant neoplasm of large intestine: Secondary | ICD-10-CM

## 2011-07-21 DIAGNOSIS — R634 Abnormal weight loss: Secondary | ICD-10-CM

## 2011-07-21 DIAGNOSIS — R110 Nausea: Secondary | ICD-10-CM

## 2011-07-21 DIAGNOSIS — Z1211 Encounter for screening for malignant neoplasm of colon: Secondary | ICD-10-CM

## 2011-07-21 DIAGNOSIS — K219 Gastro-esophageal reflux disease without esophagitis: Secondary | ICD-10-CM | POA: Diagnosis not present

## 2011-07-21 DIAGNOSIS — R197 Diarrhea, unspecified: Secondary | ICD-10-CM

## 2011-07-21 NOTE — Patient Instructions (Signed)
You have been scheduled for an endoscopy. Please follow written instructions given to you at your visit today.  

## 2011-07-21 NOTE — Progress Notes (Signed)
Subjective:    Patient ID: Brooke [name], female    DOB: 10/17/1955, 56 y.o.   MRN: [id number]  HPI 56 yo female who returns today with her daughter after recent flexible sigmoidoscopy done for surveillance of her personal history of colorectal cancer, status post extended right colectomy.  The patient is normally seen by Dr. Jarold Motto, but due to a scheduling error, I am seeing her today.  She had a flexible sigmoidoscopy on 06/25/2011 which was normal. An ileocolonic anastomosis was seen in the sigmoid, and otherwise the exam was normal.  Today, overall she is doing well, but she also discussed her frequent stools. This has been present since her colon surgery in the 1990s. She reports she has anywhere from 4-10 loose bowel movements a day. She does not recall ever taking antidiarrheals. She also states that this can be limiting to her activities, and she frequently has to stop to use the bathroom.  No blood in her stool or abdominal pain. She is somewhat concerned about an approximate 40 pound weight loss over the last 6 months. She states she knows she has more loose, but she is concerned that she lost this weight without trying. She does note some nausea as well as a long-standing history of GERD. She is taking omeprazole 40 mg daily which on most days controlled her heartburn. She denies dysphagia or odynophagia.  No fevers or chills  Review of Systems As per history of present illness, otherwise negative  Patient Active Problem List  Diagnoses  . 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 Morbid obesity  . Hypertension  . Dyslipidemia  . PVC (premature ventricular contraction)   Current Outpatient Prescriptions  Medication Sig Dispense Refill  . ALPRAZolam (XANAX) 0.25 MG tablet Take 0.25 mg by mouth 2 (two) times daily. anxiety       . clonazePAM (KLONOPIN) 0.5 MG tablet Take 1 tablet by mouth Daily.      . CYMBALTA 60 MG capsule Take 1 tablet  by mouth Daily.      Marland Kitchen FLUoxetine (PROZAC) 20 MG capsule Take 1 tablet by mouth Daily.      . hydrochlorothiazide (HYDRODIURIL) 25 MG tablet Take 25 mg by mouth daily.        Marland Kitchen ibuprofen (ADVIL,MOTRIN) 800 MG tablet Take 800 mg by mouth 2 (two) times daily.        . indomethacin (INDOCIN) 25 MG capsule Take 1 tablet by mouth Daily.      Marland Kitchen omeprazole (PRILOSEC) 40 MG capsule Take 40 mg by mouth daily.        Marland Kitchen oxyCODONE-acetaminophen (PERCOCET) 10-325 MG per tablet Take 1 tablet by mouth 2 (two) times daily.        . predniSONE (STERAPRED UNI-PAK) 5 MG TABS Take 1 tablet by mouth Daily.      . traZODone (DESYREL) 50 MG tablet Take 1 tablet by mouth Daily.       Allergies  Allergen Reactions  . Kiwi Extract Hives and Swelling   Family History  Problem Relation Age of Onset  . Uterine cancer Mother   . Stroke Father   . Colon cancer Maternal Uncle   . Diabetes Father   . Ovarian cancer Mother   . Hypertension Father   . Breast cancer Maternal Grandmother   . Diabetes Sister   . Asthma Child   . Asthma Child     Social History  . Marital Status: Widowed    Number  of Children: 2   Occupational History  . disabled    Social History Main Topics  . Smoking status: Current Some Day Smoker -- 1.0 packs/day for 30 years    Types: Cigarettes  . Smokeless tobacco: Never Used   Comment: tobacco info given 07/21/2011  . Alcohol Use: No  . Drug Use: No   Social History Narrative   Her daughter lives with her.  Caffeine daily--coffee      Objective:   Physical Exam BP 130/72  Pulse 72  Ht 4\' 11"  (1.499 m)  Wt 246 lb (111.585 kg)  BMI 49.69 kg/m2 Constitutional: Well-developed and well-nourished. No distress. HEENT: Normocephalic and atraumatic. Oropharynx is clear and moist. No oropharyngeal exudate. Conjunctivae are normal. Pupils are equal round and reactive to light. No scleral icterus. Neck: Neck supple. Trachea midline. Cardiovascular: Normal rate, regular rhythm and  intact distal pulses. No M/R/G Pulmonary/chest: Effort normal and breath sounds normal. No wheezing, rales or rhonchi. Abdominal: Soft, obese, mild epigastric tenderness without rebound or guarding, nondistended. Bowel sounds active throughout. There are no masses palpable. No hepatosplenomegaly. Extremities: no clubbing, cyanosis, or edema Lymphadenopathy: No cervical adenopathy noted. Neurological: Alert and oriented to person place and time. Skin: Skin is warm and dry. No rashes noted. Psychiatric: Normal mood and affect. Behavior is normal.      Assessment & Plan:  56 yo female who returns today with her daughter after recent flexible sigmoidoscopy done for surveillance of her personal history of colorectal cancer, status post extended right colectomy  1. History colon cancer/subtotal colectomy -- surveillance colonoscopy normal January 2013, repeat due January 2018  2. loose stools -- this has been persistent since the time of her subtotal colectomy, and likely is a result of loss of colon surface area.  We have discussed using cholestyramine or WelChol to try to curb loose stool.  I will try WelChol 2 capsules twice daily.  3. Nausea/weight loss -- due the symptoms, we have discussed upper endoscopy. She is willing to proceed. I will schedule this with Dr. Jarold Motto with propofol.  Further workup can be based on findings at endoscopy.

## 2011-07-28 ENCOUNTER — Encounter (HOSPITAL_COMMUNITY): Payer: Self-pay | Admitting: Emergency Medicine

## 2011-07-28 ENCOUNTER — Other Ambulatory Visit: Payer: Self-pay

## 2011-07-28 ENCOUNTER — Emergency Department (HOSPITAL_COMMUNITY): Payer: Medicare Other

## 2011-07-28 ENCOUNTER — Emergency Department (HOSPITAL_COMMUNITY)
Admission: EM | Admit: 2011-07-28 | Discharge: 2011-07-28 | Disposition: A | Discharge status: Home / Self Care | Payer: Medicare Other | Attending: Emergency Medicine | Admitting: Emergency Medicine

## 2011-07-28 DIAGNOSIS — Z79899 Other long term (current) drug therapy: Secondary | ICD-10-CM | POA: Diagnosis not present

## 2011-07-28 DIAGNOSIS — G8929 Other chronic pain: Secondary | ICD-10-CM | POA: Diagnosis not present

## 2011-07-28 DIAGNOSIS — I10 Essential (primary) hypertension: Secondary | ICD-10-CM | POA: Diagnosis not present

## 2011-07-28 DIAGNOSIS — R079 Chest pain, unspecified: Secondary | ICD-10-CM | POA: Diagnosis not present

## 2011-07-28 DIAGNOSIS — J4489 Other specified chronic obstructive pulmonary disease: Secondary | ICD-10-CM | POA: Insufficient documentation

## 2011-07-28 DIAGNOSIS — M19019 Primary osteoarthritis, unspecified shoulder: Secondary | ICD-10-CM | POA: Diagnosis not present

## 2011-07-28 DIAGNOSIS — M549 Dorsalgia, unspecified: Secondary | ICD-10-CM | POA: Diagnosis not present

## 2011-07-28 DIAGNOSIS — M25519 Pain in unspecified shoulder: Secondary | ICD-10-CM | POA: Diagnosis not present

## 2011-07-28 DIAGNOSIS — J449 Chronic obstructive pulmonary disease, unspecified: Secondary | ICD-10-CM | POA: Diagnosis not present

## 2011-07-28 DIAGNOSIS — M79609 Pain in unspecified limb: Secondary | ICD-10-CM | POA: Diagnosis not present

## 2011-07-28 HISTORY — DX: Other chronic pain: G89.29

## 2011-07-28 HISTORY — DX: Dorsalgia, unspecified: M54.9

## 2011-07-28 LAB — DIFFERENTIAL
Basophils Relative: 0 % (ref 0–1)
Eosinophils Absolute: 0.1 10*3/uL (ref 0.0–0.7)
Lymphs Abs: 3.1 10*3/uL (ref 0.7–4.0)
Neutro Abs: 2.9 10*3/uL (ref 1.7–7.7)
Neutrophils Relative %: 44 % (ref 43–77)

## 2011-07-28 LAB — BASIC METABOLIC PANEL
Calcium: 9.2 mg/dL (ref 8.4–10.5)
GFR calc non Af Amer: >90 mL/min (ref >90)
Potassium: 3 mEq/L — ABNORMAL LOW (ref 3.5–5.1)
Sodium: 138 mEq/L (ref 135–145)

## 2011-07-28 LAB — CBC
MCH: 32.1 pg (ref 26.0–34.0)
MCHC: 33.8 g/dL (ref 30.0–36.0)
Platelets: 212 10*3/uL (ref 150–400)
RBC: 4.7 MIL/uL (ref 3.87–5.11)

## 2011-07-28 LAB — TROPONIN I: Troponin I: <0.3 ng/mL (ref <0.30)

## 2011-07-28 MED ORDER — DIAZEPAM 5 MG PO TABS
5.0000 mg | ORAL_TABLET | Freq: Once | ORAL | Status: AC
  Administered 2011-07-28: 5 mg via ORAL
  Filled 2011-07-28: qty 1

## 2011-07-28 MED ORDER — HYDROMORPHONE HCL PF 1 MG/ML IJ SOLN
1.0000 mg | Freq: Once | INTRAMUSCULAR | Status: DC
  Filled 2011-07-28: qty 1

## 2011-07-28 MED ORDER — OXYCODONE-ACETAMINOPHEN 5-325 MG PO TABS: ORAL_TABLET | ORAL | Status: DC

## 2011-07-28 MED ORDER — METHOCARBAMOL 500 MG PO TABS: 1000.0000 mg | ORAL_TABLET | Freq: Four times a day (QID) | ORAL | Status: AC | PRN

## 2011-07-28 MED ORDER — HYDROMORPHONE HCL PF 1 MG/ML IJ SOLN
1.0000 mg | Freq: Once | INTRAMUSCULAR | Status: AC
  Administered 2011-07-28: 1 mg via INTRAVENOUS
  Filled 2011-07-28: qty 1

## 2011-07-28 MED ORDER — POTASSIUM CHLORIDE 20 MEQ/15ML (10%) PO LIQD
40.0000 meq | Freq: Once | ORAL | Status: AC
  Administered 2011-07-28: 40 meq via ORAL
  Filled 2011-07-28 (×2): qty 15

## 2011-07-28 MED ORDER — HYDROMORPHONE HCL PF 1 MG/ML IJ SOLN
1.0000 mg | Freq: Once | INTRAMUSCULAR | Status: AC
  Administered 2011-07-28: 1 mg via INTRAVENOUS

## 2011-07-28 NOTE — ED Notes (Signed)
Per pt states left arm pain radiating to back for 1 week

## 2011-07-28 NOTE — ED Provider Notes (Signed)
History     CSN: [id number]  Arrival date & time 07/28/11  1258   First MD Initiated Contact with Patient 07/28/11 1340      Chief Complaint  Patient presents with  . Extremity Pain    HPI Pt was seen at 1345.  Per pt and family, c/o gradual onset and persistence of constant left upper arm and back "pain" x1 week, worse over the past several days.  States the pain began after she was "cleaning out the closet."  Describes the pain as per her usual chronic pain pattern, worsens with movement of her shoulder and palpation of the area.  States she has ran out of her percocet and has been taking ibuprofen without relief.  Denies CP/palpitations, no SOB/cough, no injury, no abd pain, no N/V/D.    Past Medical History  Diagnosis Date  . Hypertension   . Asthma   . COPD (chronic obstructive pulmonary disease)   . Anxiety   . Cancer   . Colon cancer   . Dyslipidemia   . Migraine headache   . Chronic bronchitis   . Uterine fibroid   . Ovarian cyst   . GERD (gastroesophageal reflux disease)   . Morbid obesity   . Obstructive sleep apnea     mild  . Chronic pain   . Chronic back pain   . Arthritis   . Osteoarthritis     Past Surgical History  Procedure Date  . Knee arthroscopy     bil.  . Carpal tunnel release     rt  . Mouth surgery     teeth extraction  . Cesarean section   . Subtotal colectomy     Family History  Problem Relation Age of Onset  . Uterine cancer Mother   . Stroke Father   . Colon cancer Maternal Uncle   . Diabetes Father   . Ovarian cancer Mother   . Hypertension Father   . Breast cancer Maternal Grandmother   . Diabetes Sister   . Asthma Child   . Asthma Child     History  Substance Use Topics  . Smoking status: Current Some Day Smoker -- 1.0 packs/day for 30 years    Types: Cigarettes  . Smokeless tobacco: Never Used   Comment: tobacco info given 07/21/2011  . Alcohol Use: No    Review of Systems ROS: Statement: All systems negative  except as marked or noted in the HPI; Constitutional: Negative for fever and chills. ; ; Eyes: Negative for eye pain, redness and discharge. ; ; ENMT: Negative for ear pain, hoarseness, nasal congestion, sinus pressure and sore throat. ; ; Cardiovascular: Negative for chest pain, palpitations, diaphoresis, dyspnea and peripheral edema. ; ; Respiratory: Negative for cough, wheezing and stridor. ; ; Gastrointestinal: Negative for nausea, vomiting, diarrhea, abdominal pain, blood in stool, hematemesis, jaundice and rectal bleeding. . ; ; Genitourinary: Negative for dysuria, flank pain and hematuria. ; ; Musculoskeletal: +left upper arm and back pain. Negative for neck pain. Negative for swelling and trauma.; ; Skin: Negative for pruritus, rash, abrasions, blisters, bruising and skin lesion.; ; Neuro: Negative for headache, lightheadedness and neck stiffness. Negative for weakness, altered level of consciousness , altered mental status, extremity weakness, paresthesias, involuntary movement, seizure and syncope.     Allergies  Aspirin and Kiwi extract  Home Medications   Current Outpatient Rx  Name Route Sig Dispense Refill  . ALPRAZOLAM 0.25 MG PO TABS Oral Take 0.25 mg by mouth 2 (two) times  daily. anxiety     . CLONAZEPAM 0.5 MG PO TABS Oral Take 1 tablet by mouth Daily.    . CYMBALTA 60 MG PO CPEP Oral Take 1 tablet by mouth Daily.    Marland Kitchen FLUOXETINE HCL 20 MG PO CAPS Oral Take 1 tablet by mouth Daily.    Marland Kitchen HYDROCHLOROTHIAZIDE 25 MG PO TABS Oral Take 25 mg by mouth daily.      . IBUPROFEN 800 MG PO TABS Oral Take 800 mg by mouth 2 (two) times daily.      . INDOMETHACIN 25 MG PO CAPS Oral Take 1 tablet by mouth Daily.    Marland Kitchen OMEPRAZOLE 40 MG PO CPDR Oral Take 40 mg by mouth daily.      . OXYCODONE-ACETAMINOPHEN 10-325 MG PO TABS Oral Take 1 tablet by mouth 2 (two) times daily.      . TRAZODONE HCL 50 MG PO TABS Oral Take 1 tablet by mouth Daily.    Marland Kitchen PREDNISONE (PAK) 5 MG PO TABS Oral Take 1 tablet by  mouth Daily.      BP 130/85  Pulse 95  Temp(Src) 97.9 F (36.6 C) (Oral)  Resp 18  SpO2 99%  Physical Exam 1350: Physical examination:  Nursing notes reviewed; Vital signs and O2 SAT reviewed;  Constitutional: Well developed, Well nourished, Well hydrated, In no acute distress; Head:  Normocephalic, atraumatic; Eyes: EOMI, PERRL, No scleral icterus; ENMT: TM's clear bilat.  +edemetous nasal turbinates bilat with clear rhinorrhea, Mouth and pharynx normal, Mucous membranes moist; Neck: Supple, Full range of motion, No lymphadenopathy; Cardiovascular: Regular rate and rhythm, No murmur, rub, or gallop; Respiratory: Breath sounds clear & equal bilaterally, No wheezes, speaking full sentences with ease.  Normal respiratory effort/excursion; Chest: +left upper anterior chest wall tender to palp, no deformity, no soft tissue crepitus, Movement normal; Abdomen: Soft, Nontender, Nondistended, Normal bowel sounds; Spine:  No midline CS, TS, LS tenderness, +TTP left trapezius muscle.; Genitourinary: No CVA tenderness; Extremities: Pulses normal, +TTP entire left upper arm, shoulder, upper left anterior chest wall. Left shoulder w/FROM.  +TTP entire left shoulder.  Clavicle NT, scapula NT.  Motor strength at shoulder normal.  Sensation intact over deltoid region, distal NMS intact with left hand intact sensation and strength in the distribution of the median, radial, and ulnar nerve function.  Strong radial pulse.  +FROM left elbow with intact motor strength biceps and triceps muscles to resistance. No deformity, no rash. No edema, No calf edema or asymmetry.; Neuro: AA&Ox3, Major CN grossly intact.  No gross focal motor or sensory deficits in extremities.; Skin: Color normal, Warm, Dry, no rash.    ED Course  Procedures   MDM  MDM Reviewed: previous chart, nursing note and vitals Reviewed previous: ECG Interpretation: ECG, x-ray and labs    Date: 07/28/2011  Rate: 87  Rhythm: normal sinus rhythm   QRS Axis: normal  Intervals: normal  ST/T Wave abnormalities: normal  Conduction Disutrbances:none  Narrative Interpretation:   Old EKG Reviewed: unchanged, previous EKG dated 07/26/2010 with multiple PVC's improved on today's EKG, otherwise no significant changes.  Results for orders placed during the hospital encounter of 07/28/11  CBC      Component Value Range   WBC 6.5  4.0 - 10.5 (K/uL)   RBC 4.70  3.87 - 5.11 (MIL/uL)   Hemoglobin 15.1 (*) 12.0 - 15.0 (g/dL)   HCT 16.1  09.6 - 04.5 (%)   MCV 95.1  78.0 - 100.0 (fL)   MCH 32.1  26.0 - 34.0 (pg)   MCHC 33.8  30.0 - 36.0 (g/dL)   RDW 14.7  82.9 - 56.2 (%)   Platelets 212  150 - 400 (K/uL)  DIFFERENTIAL      Component Value Range   Neutrophils Relative 44  43 - 77 (%)   Neutro Abs 2.9  1.7 - 7.7 (K/uL)   Lymphocytes Relative 48 (*) 12 - 46 (%)   Lymphs Abs 3.1  0.7 - 4.0 (K/uL)   Monocytes Relative 6  3 - 12 (%)   Monocytes Absolute 0.4  0.1 - 1.0 (K/uL)   Eosinophils Relative 2  0 - 5 (%)   Eosinophils Absolute 0.1  0.0 - 0.7 (K/uL)   Basophils Relative 0  0 - 1 (%)   Basophils Absolute 0.0  0.0 - 0.1 (K/uL)  TROPONIN I      Component Value Range   Troponin I <0.30  <0.30 (ng/mL)  BASIC METABOLIC PANEL      Component Value Range   Sodium 138  135 - 145 (mEq/L)   Potassium 3.0 (*) 3.5 - 5.1 (mEq/L)   Chloride 98  96 - 112 (mEq/L)   CO2 32  19 - 32 (mEq/L)   Glucose, Bld 107 (*) 70 - 99 (mg/dL)   BUN 9  6 - 23 (mg/dL)   Creatinine, Ser 1.30  0.50 - 1.10 (mg/dL)   Calcium 9.2  8.4 - 86.5 (mg/dL)   GFR calc non Af Amer >90  >90 (mL/min)   GFR calc Af Amer >90  >90 (mL/min)   Dg Chest 2 View 07/28/2011  *RADIOLOGY REPORT*  Clinical Data: Chest pain, left shoulder pain.  CHEST - 2 VIEW  Comparison: 09/29/2010.  Findings: Trachea midline.  Heart size normal.  Mild interstitial prominence appears chronic.  No pleural fluid.  IMPRESSION: Mild interstitial prominence appears chronic.  No acute findings.  Original Report  Authenticated By: Reyes Ivan, M.D.   Dg Shoulder Left 07/28/2011  *RADIOLOGY REPORT*  Clinical Data: Left shoulder pain.  No known injury.  LEFT SHOULDER - 2+ VIEW  Comparison: None.  Findings: There are glenohumeral and AC joint degenerative changes. No acute fracture.  No dislocation.  The left lung apex is clear.  IMPRESSION: AC joint and glenohumeral joint degenerative changes. No acute bony findings.  Original Report Authenticated By: P. Loralie Champagne, M.D.     4:00 PM:  Pt states she feels "better now" and wants to go home.  Requesting refill of her chronic pain meds.  States she "doesn't like to go to the Pain Clinic" because "they don't help my pain."  Family wants to take pt home now.  Doubt ACS or PE as cause for symptoms, given unchanged EKG and normal troponin after 1 week of constant symptoms, as well as no hypoxia/tachycardia/tachypnea.  Dx testing d/w pt and family.  Questions answered.  Verb understanding, agreeable to d/c home with outpt f/u.              Laray Anger, DO 07/30/11 [phone]

## 2011-07-28 NOTE — Discharge Instructions (Signed)
RESOURCE GUIDE  Dental Problems  Patients with Medicaid: [facility]                     [facility] [address]                                           [address] Phone:  [phone]                                                  Phone:  [phone]  If unable to pay or uninsured, contact:  [facility] or [facility] to become qualified for the adult dental clinic.  Chronic Pain Problems Contact [facility]  [phone] Patients need to be referred by their primary care doctor.  Insufficient Money for Medicine Contact [employer]:  call "211" or [facility] 271-5999.  No Primary Care Doctor Call [facility]  832-8000 Other agencies that provide inexpensive medical care    [facility]  [phone]    [facility]  [phone]    [facility]  [phone]    Women[facility] Clinic  [phone]    [facility]  [phone]    [facility]  [phone]  Psychological Services [facility]  [phone] [facility]  [phone] [facility]   [phone] (emergency services [phone])  Substance Abuse Resources [facility]  336-882-2125 [facility] 336-784-9470 [facility] 336-285-9073 [facility] 336-845-3988 Residential & Outpatient Substance Abuse Program  800-659-3381  Abuse/Neglect [place] Child Abuse Hotline (336) 641-3795 [place] Child Abuse Hotline 800-378-5315 (After Hours)  Emergency Shelter [facility] (336) 271-5985  [facility] (336) 275-9566 [facility] (704) 372-4663  MRSA Hotline #:   832-7006    [place] [facility]  [facility] [place] [facility]     [employer]                          [facility] [address] [place]                       [address]      [address]                                                  [place]                            [place] Phone:  [phone]                                   Phone:  [phone]                 Phone:  [phone]  [facility] Phone:  [phone]    [place] Child Abuse Hotline (336) 342-1394 (336) 342-3537 (After Hours)   Take the prescriptions as directed.  Apply moist heat or ice to the area(s) of discomfort, for 15 minutes at a time, several times per day for the next few days.  Do not fall asleep on a heating or ice pack.  Call your regular medical doctor today to schedule a follow up appointment this week.  Return to the Emergency Department immediately if worsening.  

## 2011-07-30 ENCOUNTER — Encounter: Payer: Self-pay | Admitting: Gastroenterology

## 2011-07-30 ENCOUNTER — Ambulatory Visit (AMBULATORY_SURGERY_CENTER): Payer: Medicare Other | Admitting: Gastroenterology

## 2011-07-30 ENCOUNTER — Other Ambulatory Visit: Payer: Self-pay | Admitting: Gastroenterology

## 2011-07-30 DIAGNOSIS — K2990 Gastroduodenitis, unspecified, without bleeding: Secondary | ICD-10-CM | POA: Diagnosis not present

## 2011-07-30 DIAGNOSIS — K210 Gastro-esophageal reflux disease with esophagitis: Secondary | ICD-10-CM

## 2011-07-30 DIAGNOSIS — K219 Gastro-esophageal reflux disease without esophagitis: Secondary | ICD-10-CM | POA: Diagnosis not present

## 2011-07-30 DIAGNOSIS — Z9889 Other specified postprocedural states: Secondary | ICD-10-CM

## 2011-07-30 DIAGNOSIS — T8189XA Other complications of procedures, not elsewhere classified, initial encounter: Secondary | ICD-10-CM

## 2011-07-30 DIAGNOSIS — R197 Diarrhea, unspecified: Secondary | ICD-10-CM | POA: Insufficient documentation

## 2011-07-30 DIAGNOSIS — K2970 Gastritis, unspecified, without bleeding: Secondary | ICD-10-CM | POA: Diagnosis not present

## 2011-07-30 DIAGNOSIS — F411 Generalized anxiety disorder: Secondary | ICD-10-CM | POA: Diagnosis not present

## 2011-07-30 MED ORDER — DIPHENOXYLATE-ATROPINE 2.5-0.025 MG PO TABS: 1.0000 | ORAL_TABLET | Freq: Two times a day (BID) | ORAL | Status: AC

## 2011-07-30 MED ORDER — SODIUM CHLORIDE 0.9 % IV SOLN: 500.0000 mL | INTRAVENOUS | Status: DC

## 2011-07-30 NOTE — Patient Instructions (Addendum)
Await biopsy results  YOU HAD AN ENDOSCOPIC PROCEDURE TODAY AT THE [facility]: Refer to the procedure report that was given to you for any specific questions about what was found during the examination.  If the procedure report does not answer your questions, please call your gastroenterologist to clarify.  If you requested that your care partner not be given the details of your procedure findings, then the procedure report has been included in a sealed envelope for you to review at your convenience later.  YOU SHOULD EXPECT: Some feelings of bloating in the abdomen. Passage of more gas than usual.  Walking can help get rid of the air that was put into your GI tract during the procedure and reduce the bloating. If you had a lower endoscopy (such as a colonoscopy or flexible sigmoidoscopy) you may notice spotting of blood in your stool or on the toilet paper. If you underwent a bowel prep for your procedure, then you may not have a normal bowel movement for a few days.  DIET: Your first meal following the procedure should be a light meal and then it is ok to progress to your normal diet.  A half-sandwich or bowl of soup is an example of a good first meal.  Heavy or fried foods are harder to digest and may make you feel nauseous or bloated.  Likewise meals heavy in dairy and vegetables can cause extra gas to form and this can also increase the bloating.  Drink plenty of fluids but you should avoid alcoholic beverages for 24 hours.  ACTIVITY: Your care partner should take you home directly after the procedure.  You should plan to take it easy, moving slowly for the rest of the day.  You can resume normal activity the day after the procedure however you should NOT DRIVE or use heavy machinery for 24 hours (because of the sedation medicines used during the test).    SYMPTOMS TO REPORT IMMEDIATELY: A gastroenterologist can be reached at any hour.  During normal business hours, 8:30 AM to 5:00 PM  Monday through Friday, call [phone].  After hours and on weekends, please call the GI answering service at [phone] who will take a message and have the physician on call contact you.   Following lower endoscopy (colonoscopy or flexible sigmoidoscopy):  Excessive amounts of blood in the stool  Significant tenderness or worsening of abdominal pains  Swelling of the abdomen that is new, acute  Fever of 100F or higher  Following upper endoscopy (EGD)  Vomiting of blood or coffee ground material  New chest pain or pain under the shoulder blades  Painful or persistently difficult swallowing  New shortness of breath  Fever of 100F or higher  Black, tarry-looking stools  FOLLOW UP: If any biopsies were taken you will be contacted by phone or by letter within the next 1-3 weeks.  Call your gastroenterologist if you have not heard about the biopsies in 3 weeks.  Our staff will call the home number listed on your records the next business day following your procedure to check on you and address any questions or concerns that you may have at that time regarding the information given to you following your procedure. This is a courtesy call and so if there is no answer at the home number and we have not heard from you through the emergency physician on call, we will assume that you have returned to your regular daily activities without incident.  SIGNATURES/CONFIDENTIALITY:  You and/or your care partner have signed paperwork which will be entered into your electronic medical record.  These signatures attest to the fact that that the information above on your After Visit Summary has been reviewed and is understood.  Full responsibility of the confidentiality of this discharge information lies with you and/or your care-partner.   Gastritis Gastritis is an inflammation (the body's way of reacting to injury and/or infection) of the stomach. It is often caused by viral or bacterial (germ)  infections. It can also be caused by chemicals (including alcohol) and medications. This illness may be associated with generalized malaise (feeling tired, not well), cramps, and fever. The illness may last 2 to 7 days. If symptoms of gastritis continue, gastroscopy (looking into the stomach with a telescope-like instrument), biopsy (taking tissue samples), and/or blood tests may be necessary to determine the cause. Antibiotics will not affect the illness unless there is a bacterial infection present. One common bacterial cause of gastritis is an organism known as H. Pylori. This can be treated with antibiotics. Other forms of gastritis are caused by too much acid in the stomach. They can be treated with medications such as H2 blockers and antacids. Home treatment is usually all that is needed. Young children will quickly become dehydrated (loss of body fluids) if vomiting and diarrhea are both present. Medications may be given to control nausea. Medications are usually not given for diarrhea unless especially bothersome. Some medications slow the removal of the virus from the gastrointestinal tract. This slows down the healing process. HOME CARE INSTRUCTIONS Home care instructions for nausea and vomiting:  For adults: drink small amounts of fluids often. Drink at least 2 quarts a day. Take sips frequently. Do not drink large amounts of fluid at one time. This may worsen the nausea.   Only take over-the-counter or prescription medicines for pain, discomfort, or fever as directed by your caregiver.   Drink clear liquids only. Those are anything you can see through such as water, broth, or soft drinks.   Once you are keeping clear liquids down, you may start full liquids, soups, juices, and ice cream or sherbet. Slowly add bland (plain, not spicy) foods to your diet.  Home care instructions for diarrhea:  Diarrhea can be caused by bacterial infections or a virus. Your condition should improve with time,  rest, fluids, and/or anti-diarrheal medication.   Until your diarrhea is under control, you should drink clear liquids often in small amounts. Clear liquids include: water, broth, jell-o water and weak tea.  Avoid:  Milk.   Fruits.   Tobacco.   Alcohol.   Extremely hot or cold fluids.   Too much intake of anything at one time.  When your diarrhea stops you may add the following foods, which help the stool to become more formed:  Rice.   Bananas.   Apples without skin.   Dry toast.  Once these foods are tolerated you may add low-fat yogurt and low-fat cottage cheese. They will help to restore the normal bacterial balance in your bowel. Wash your hands well to avoid spreading bacteria (germ) or virus. SEEK IMMEDIATE MEDICAL CARE IF:   You are unable to keep fluids down.   Vomiting or diarrhea become persistent (constant).   Abdominal pain develops, increases, or localizes. (Right sided pain can be appendicitis. Left sided pain in adults can be diverticulitis.)   You develop a fever (an oral temperature above 102 F (38.9 C)).   Diarrhea becomes excessive or contains  blood or mucus.   You have excessive weakness, dizziness, fainting or extreme thirst.   You are not improving or you are getting worse.   You have any other questions or concerns.  Document Released: 05/20/2001 Document Revised: 02/05/2011 Document Reviewed: 05/26/2005 [facility] Patient Information 2012 [place], [place].

## 2011-07-30 NOTE — Op Note (Signed)
[facility] [address] [place], [place]  [address]  ENDOSCOPY PROCEDURE REPORT  PATIENT:  Brooke [name], Brooke [name]  MR#:  [id number] BIRTHDATE:  Mar 08, 1956, 55 yrs. old  GENDER:  female  ENDOSCOPIST:  Vania Rea. Jarold Motto, MD, [facility] Referred by:  PROCEDURE DATE:  07/30/2011 PROCEDURE:  EGD with biopsy, 19147 ASA CLASS:  Class III INDICATIONS:  globus, heartburn, dyspepsia, diarrhea  MEDICATIONS:   propofol (Diprivan) 150 mg IV TOPICAL ANESTHETIC:  DESCRIPTION OF PROCEDURE:   After the risks and benefits of the procedure were explained, informed consent was obtained.  The [facility] [id number] endoscope was introduced through the mouth and advanced to the second portion of the duodenum.  The instrument was slowly withdrawn as the mucosa was fully examined. <<PROCEDUREIMAGES>>  Moderate gastritis was found in the antrum. EROSIVE GASTRITIS BIOPSIED AND CLO BX. DONE.SEE PICTURES.  There were columnar-type mucosal changes in the distal esophagus, that could represent Barrett's esophagus. at the gastroesophageal junction. GE JUNCTION BIOPSIED.  Normal duodenal folds were noted. SI BIOPSIES DONE There were mucosal changes in the proximal esophagus consistent with a thoracic inlet patch. in the proximal esophagus. Retroflexed views revealed no abnormalities.    The scope was then withdrawn from the patient and the procedure completed.  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  ______________________________ Vania Rea.  Jarold Motto, MD, Clementeen Graham  CC:  Nicki Reaper MD, Christiana Fuchs MD  n. Rosalie DoctorMarland Kitchen   Vania Rea. Kathline Banbury at 07/30/2011 09:55 AM  Rich Brave, [phone]

## 2011-07-30 NOTE — Progress Notes (Signed)
Patient did not experience any of the following events: a burn prior to discharge; a fall within the facility; wrong site/side/patient/procedure/implant event; or a hospital transfer or hospital admission upon discharge from the facility. (G8907) Patient did not have preoperative order for IV antibiotic SSI prophylaxis. (G8918)  

## 2011-07-31 ENCOUNTER — Telehealth: Payer: Self-pay

## 2011-07-31 NOTE — Telephone Encounter (Signed)
  Follow up Call-  Call back number 07/30/2011 06/25/2011  Post procedure Call Back phone  # [phone]  Permission to leave phone message Yes -     Patient questions:  Do you have a fever, pain , or abdominal swelling? no Pain Score  0 *  Have you tolerated food without any problems? yes  Have you been able to return to your normal activities? yes  Do you have any questions about your discharge instructions: Diet   no Medications  no Follow up visit  no  Do you have questions or concerns about your Care? no  Actions: * If pain score is 4 or above: No action needed, pain <4.

## 2011-08-03 IMAGING — CR DG ANKLE COMPLETE 3+V*L*
3 series · 3 of 3 positions shown · non-contrast
Comparison: None.

CLINICAL DATA: Left ankle swelling and pain.  Difficulty weight
bearing.

LEFT ANKLE COMPLETE - 3+ VIEW

[t ankle joint ap left]
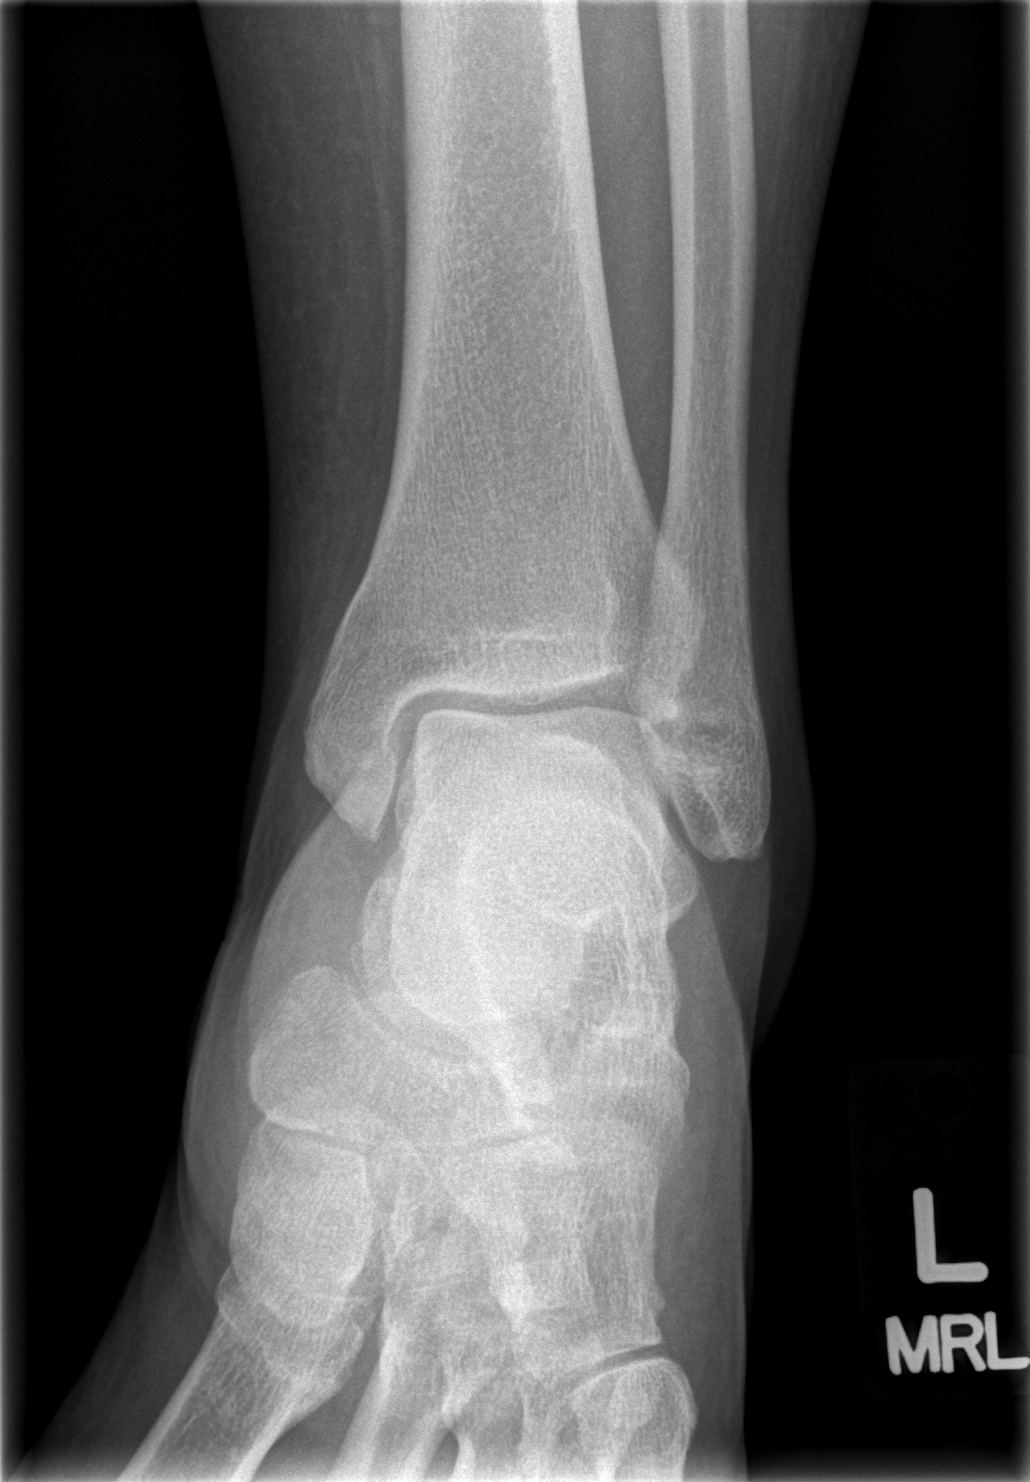

[t ankle joint oblique left]
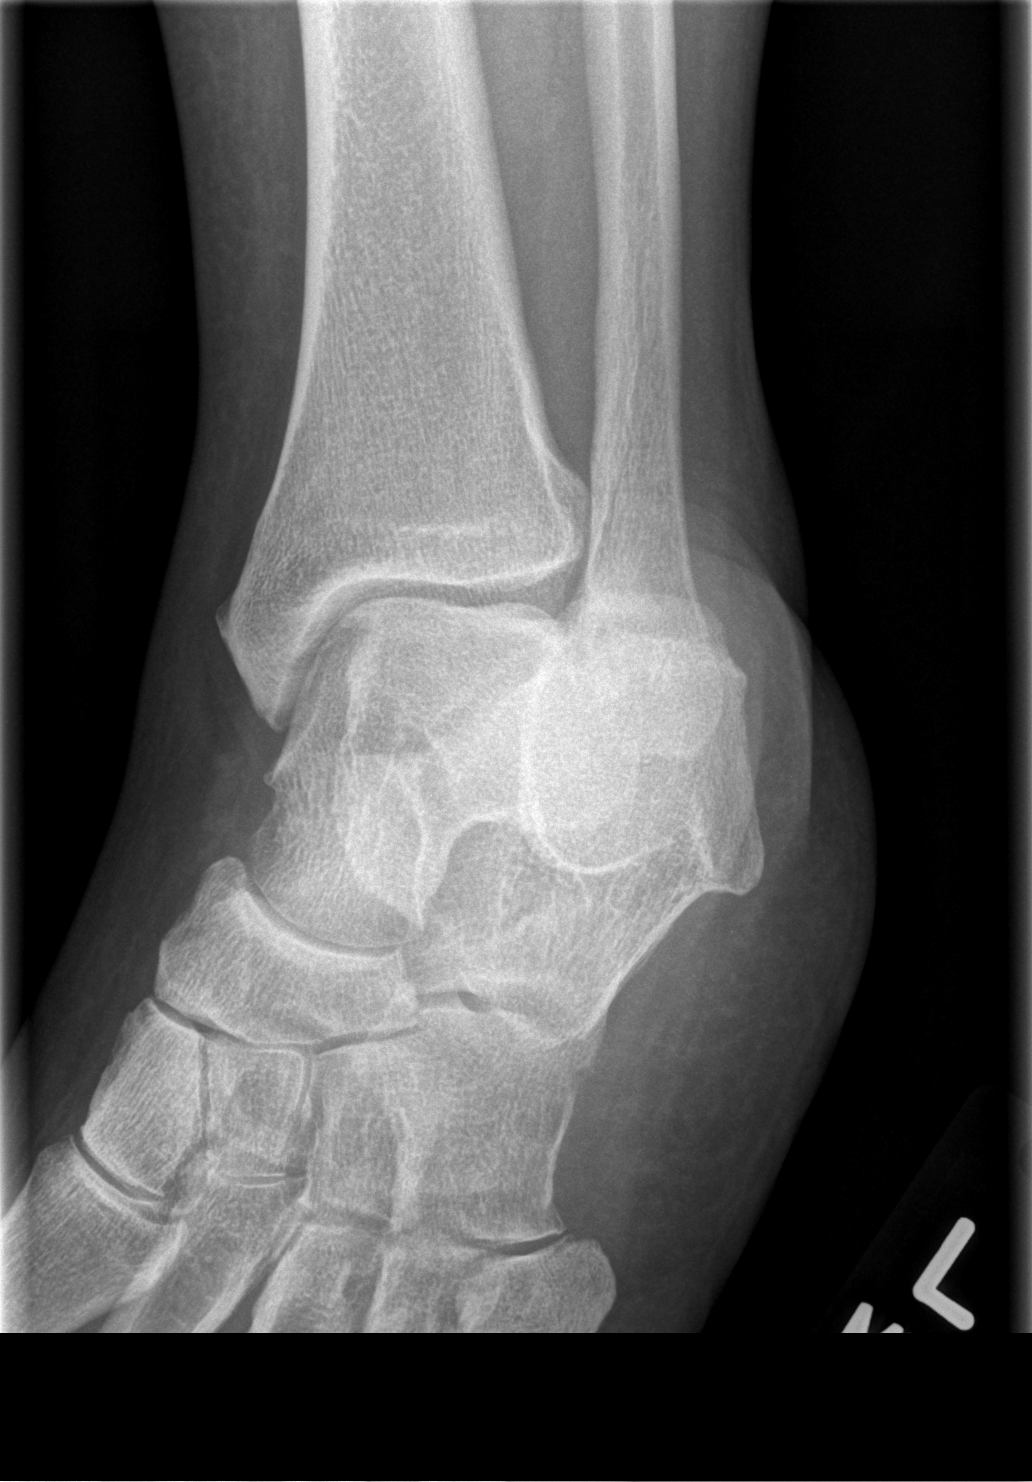

[t ankle joint lat left]
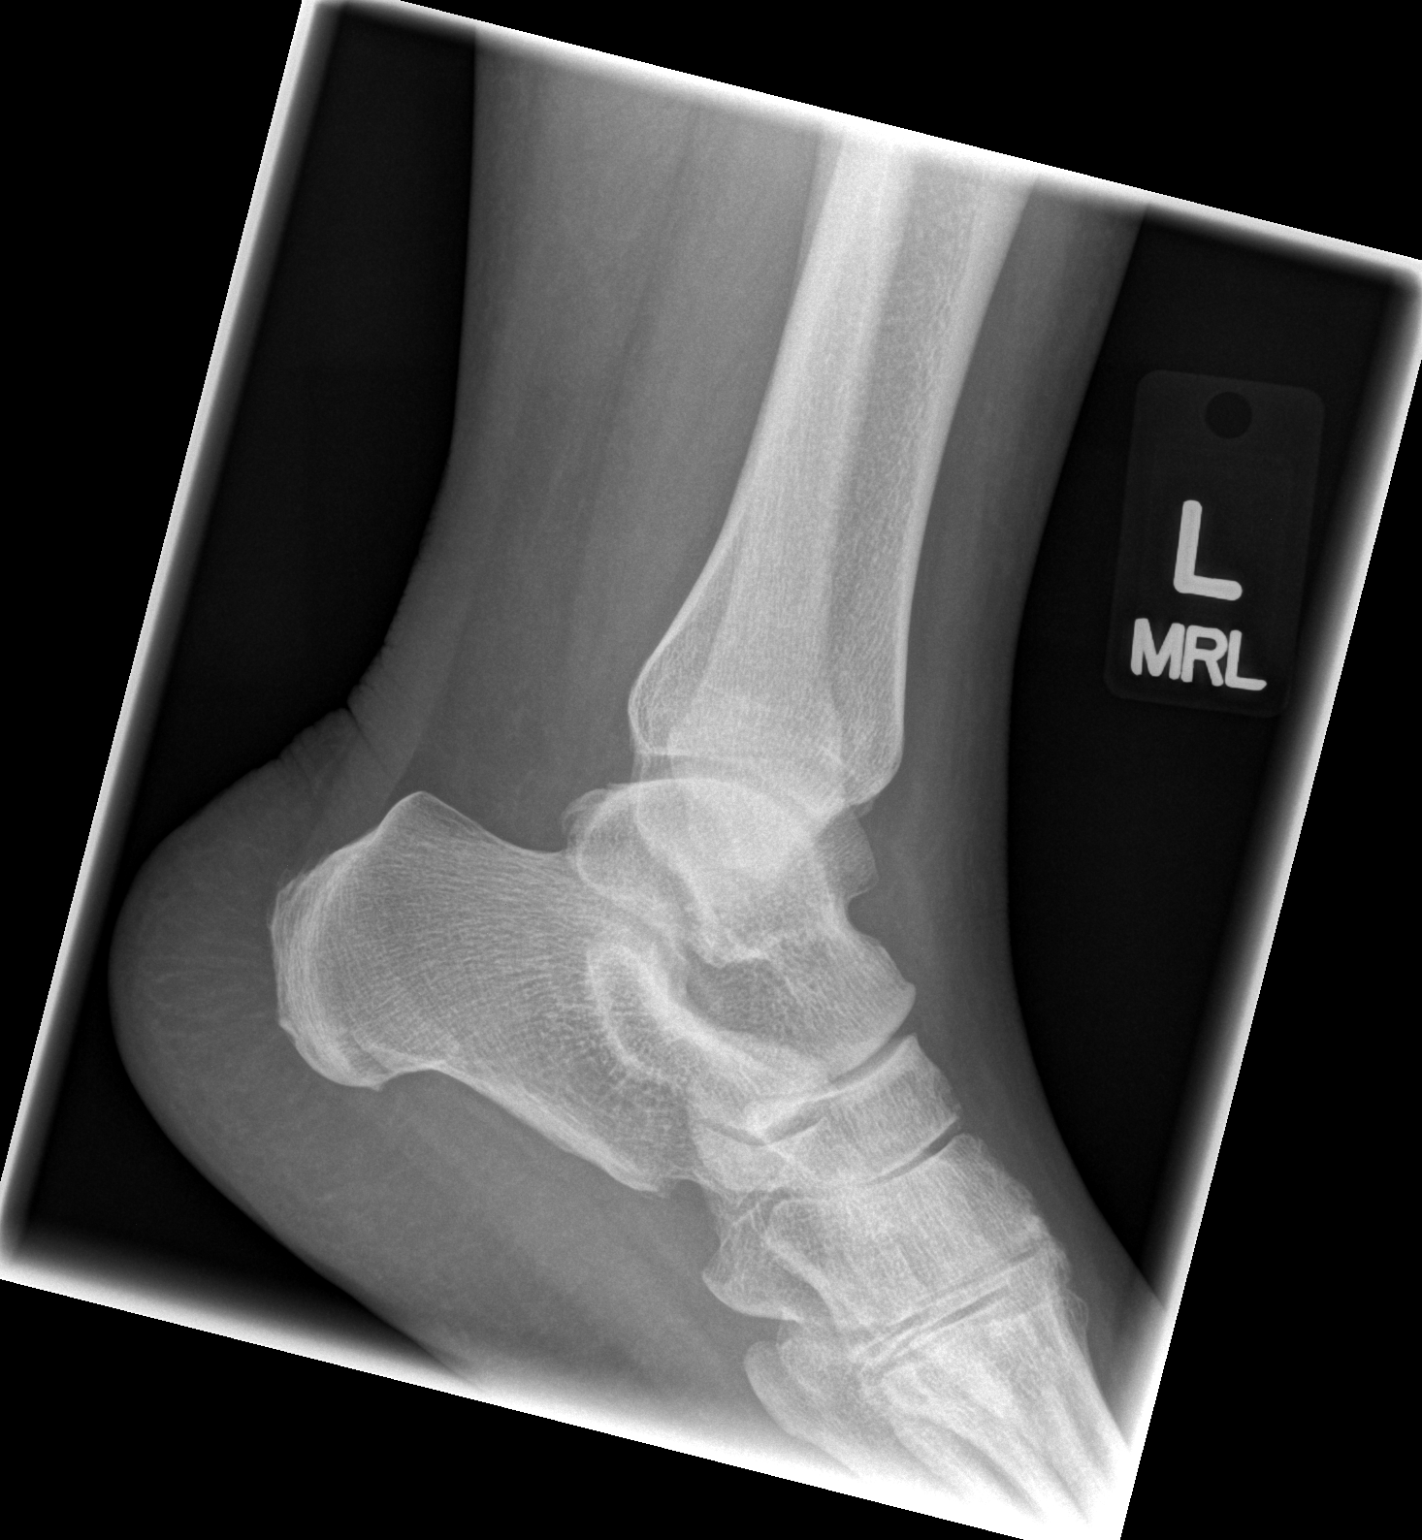

[3 of 3 positions shown; findings below may reference images not displayed]

FINDINGS: There is mild soft tissue swelling about the ankle joint.
No underlying acute fracture.  Tiny calcaneal spurs.
IMPRESSION: Mild soft tissue swelling without underlying acute fracture.

## 2011-08-04 DIAGNOSIS — E763 Mucopolysaccharidosis, unspecified: Secondary | ICD-10-CM | POA: Diagnosis not present

## 2011-08-04 DIAGNOSIS — F411 Generalized anxiety disorder: Secondary | ICD-10-CM | POA: Diagnosis not present

## 2011-08-04 DIAGNOSIS — I10 Essential (primary) hypertension: Secondary | ICD-10-CM | POA: Diagnosis not present

## 2011-08-04 DIAGNOSIS — E669 Obesity, unspecified: Secondary | ICD-10-CM | POA: Diagnosis not present

## 2011-08-06 ENCOUNTER — Encounter: Payer: Self-pay | Admitting: Gastroenterology

## 2011-08-10 ENCOUNTER — Encounter (HOSPITAL_COMMUNITY): Payer: Self-pay | Admitting: *Deleted

## 2011-08-10 ENCOUNTER — Inpatient Hospital Stay (HOSPITAL_COMMUNITY)
Admission: AD | Admit: 2011-08-10 | Discharge: 2011-08-10 | Disposition: A | Discharge status: Home / Self Care | Payer: Medicare Other | Source: Ambulatory Visit | Attending: Obstetrics & Gynecology | Admitting: Obstetrics & Gynecology

## 2011-08-10 DIAGNOSIS — N949 Unspecified condition associated with female genital organs and menstrual cycle: Secondary | ICD-10-CM | POA: Diagnosis not present

## 2011-08-10 DIAGNOSIS — N909 Noninflammatory disorder of vulva and perineum, unspecified: Secondary | ICD-10-CM | POA: Insufficient documentation

## 2011-08-10 DIAGNOSIS — I10 Essential (primary) hypertension: Secondary | ICD-10-CM | POA: Insufficient documentation

## 2011-08-10 DIAGNOSIS — B9689 Other specified bacterial agents as the cause of diseases classified elsewhere: Secondary | ICD-10-CM

## 2011-08-10 DIAGNOSIS — B009 Herpesviral infection, unspecified: Secondary | ICD-10-CM

## 2011-08-10 LAB — WET PREP, GENITAL: Trich, Wet Prep: NONE SEEN

## 2011-08-10 MED ORDER — OXYCODONE-ACETAMINOPHEN 5-325 MG PO TABS
2.0000 | ORAL_TABLET | Freq: Once | ORAL | Status: AC
  Administered 2011-08-10: 2 via ORAL
  Filled 2011-08-10: qty 2

## 2011-08-10 MED ORDER — ACYCLOVIR 400 MG PO TABS: 400.0000 mg | ORAL_TABLET | Freq: Three times a day (TID) | ORAL | Status: AC

## 2011-08-10 MED ORDER — METRONIDAZOLE 500 MG PO TABS: 500.0000 mg | ORAL_TABLET | Freq: Three times a day (TID) | ORAL | Status: AC

## 2011-08-10 NOTE — Progress Notes (Signed)
Aching vaginal pain since 1pm today. Was given a pill for yeast infection last week but that didn't help.

## 2011-08-10 NOTE — ED Provider Notes (Signed)
Chief Complaint:  Vaginal Pain  Brooke [name] is  56 y.o. [id number].  No LMP recorded. Patient is postmenopausal. She presents complaining of Vaginal Pain  Onset was one week ago. Patient went to her doctor who gave her Fluconazole for yeast infection without an exam. She states that treatment did not help. She continues to have vaginal pain/discomfort and abdominal pain that started today. She has one partner that she does not speak highly of. She has not had intercourse since her current pain started. She denies any vaginal discharge at this time. She has not had any bleeding.  Patient states she has had chlamydia in the past, but no other sexually transmitted diseases.   Past Medical History: Past Medical History  Diagnosis Date  . Hypertension   . Asthma   . COPD (chronic obstructive pulmonary disease)   . Anxiety   . Cancer   . Colon cancer   . Dyslipidemia   . Migraine headache   . Chronic bronchitis   . Uterine fibroid   . Ovarian cyst   . GERD (gastroesophageal reflux disease)   . Morbid obesity   . Obstructive sleep apnea     mild  . Chronic pain   . Chronic back pain   . Arthritis   . Osteoarthritis   . Depression     Past Surgical History: Past Surgical History  Procedure Date  . Knee arthroscopy     bil.  . Carpal tunnel release     rt  . Mouth surgery     teeth extraction  . Cesarean section   . Subtotal colectomy   . Colonoscopy   . Tubal ligation     Family History: Family History  Problem Relation Age of Onset  . Uterine cancer Mother   . Stroke Father   . Colon cancer Maternal Uncle   . Diabetes Father   . Ovarian cancer Mother   . Hypertension Father   . Breast cancer Maternal Grandmother   . Diabetes Sister   . Asthma Child   . Asthma Child     Social History: History  Substance Use Topics  . Smoking status: Current Some Day Smoker -- 1.0 packs/day for 30 years    Types: Cigarettes  . Smokeless tobacco: Never Used   Comment:  tobacco info given 07/21/2011  . Alcohol Use: No    Allergies:  Allergies  Allergen Reactions  . Aspirin Nausea Only  . Kiwi Extract Hives and Swelling    Prescriptions prior to admission  Medication Sig Dispense Refill  . ALPRAZolam (XANAX) 0.25 MG tablet Take 0.25 mg by mouth 2 (two) times daily. anxiety       . clonazePAM (KLONOPIN) 0.5 MG tablet Take 1 tablet by mouth Daily.      . CYMBALTA 60 MG capsule Take 1 tablet by mouth Daily.      . diphenoxylate-atropine (LOMOTIL) 2.5-0.025 MG per tablet Take 1 tablet by mouth 2 (two) times daily.  60 tablet  6  . FLUoxetine (PROZAC) 20 MG capsule Take 1 tablet by mouth Daily.      . hydrochlorothiazide (HYDRODIURIL) 25 MG tablet Take 25 mg by mouth daily.        Marland Kitchen ibuprofen (ADVIL,MOTRIN) 800 MG tablet Take 800 mg by mouth 2 (two) times daily.        . indomethacin (INDOCIN) 25 MG capsule Take 1 tablet by mouth Daily.      Marland Kitchen omeprazole (PRILOSEC) 40 MG capsule Take 40 mg by mouth  daily.        . oxyCODONE-acetaminophen (PERCOCET) 10-325 MG per tablet Take 1 tablet by mouth 2 (two) times daily.        . predniSONE (STERAPRED UNI-PAK) 5 MG TABS Take 1 tablet by mouth Daily.      . traZODone (DESYREL) 50 MG tablet Take 1 tablet by mouth Daily.        Review of Systems - Negative except headaches, wheezing, abd pain, pain in legs bilaterally  Physical Exam   Blood pressure 118/77, pulse 87, temperature 97.8 F (36.6 C), temperature source Oral, resp. rate 18, height 4\' 11"  (1.499 m), weight 249 lb 6 oz (113.116 kg), SpO2 99.00%.  General: General appearance - alert, well appearing, and in no distress Focused Gynecological Exam: VULVA: vulvar tenderness bilaterally, vulvar hypopigmented ulcerated lesions bilaterally without excoriations. One lesion on right labia with crater like appearance- HSV culture sent to lab. VAGINA: normal appearing vagina with normal color and discharge, no lesions, DNA probe for chlamydia and GC obtained,  moderate dryness CERVIX: normal appearing cervix without discharge or lesions, no CMT WET MOUNT done - results: clue cells, white blood cells, exam chaperoned by RN  Labs: Recent Results (from the past 24 hour(s))  WET PREP, GENITAL   Collection Time   08/10/11 10:44 PM      Component Value Range   Yeast Wet Prep HPF POC NONE SEEN  NONE SEEN    Trich, Wet Prep NONE SEEN  NONE SEEN    Clue Cells Wet Prep HPF POC FEW (*) NONE SEEN    WBC, Wet Prep HPF POC FEW (*) NONE SEEN    Imaging Studies: None  Assessment: Patient Active Problem List  Diagnoses  . 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 Morbid obesity  . Hypertension  . Dyslipidemia  . PVC (premature ventricular contraction)  . Esophageal reflux  . Diarrhea following gastrointestinal surgery    Plan: Patient is a 56 yo female presenting for vaginal pain - Wet prep shows few clue cells, and few WBC. Will give Rx for Flagyl 500mg  BID for 7 days. This was sent to the pharmacy. - GC/Ch pending. - Patient has vulvar lesions bilaterally, with no reported history of HSV. Discussed these lesions with patient. HSV culture sent. Given Acyclovir 400mg  TID x10 days. She should follow up with her PCP in 1-2 weeks for follow up. She should also make her partner aware and he should be treated as well. Patient was given handout with information and questions were answered prior to discharge. - Pain controlled in the MAU with Percocet (which she takes at home.) - Plan discussed with Wynelle Bourgeois CNM, who agrees with above plan. - Discharged home in stable medical condition.  Brooke [name] 08/10/2011,11:00 PM

## 2011-08-12 LAB — GC/CHLAMYDIA PROBE AMP, GENITAL: GC Probe Amp, Genital: NEGATIVE

## 2011-08-12 NOTE — ED Provider Notes (Signed)
Medical Screening exam and patient care preformed by advanced practice provider.  Agree with the above management.  

## 2011-08-13 LAB — HERPES SIMPLEX VIRUS CULTURE

## 2011-08-14 ENCOUNTER — Inpatient Hospital Stay (HOSPITAL_COMMUNITY)
Admission: AD | Admit: 2011-08-14 | Discharge: 2011-08-15 | Disposition: A | Discharge status: Home / Self Care | Payer: Medicare Other | Source: Ambulatory Visit | Attending: Obstetrics & Gynecology | Admitting: Obstetrics & Gynecology

## 2011-08-14 ENCOUNTER — Encounter (HOSPITAL_COMMUNITY): Payer: Self-pay | Admitting: *Deleted

## 2011-08-14 DIAGNOSIS — N949 Unspecified condition associated with female genital organs and menstrual cycle: Secondary | ICD-10-CM | POA: Diagnosis not present

## 2011-08-14 DIAGNOSIS — N760 Acute vaginitis: Secondary | ICD-10-CM

## 2011-08-14 DIAGNOSIS — A6000 Herpesviral infection of urogenital system, unspecified: Secondary | ICD-10-CM | POA: Insufficient documentation

## 2011-08-14 NOTE — Progress Notes (Signed)
PT SAYS ON 2-25- WENT TO DR- TOLD YEAST INFECTION- GAVE HER MED.   SHE CAME HERE ON Monday-  MED DID NOT WORK    ON Monday- DID SPEC EXAM-  RX- MED- PT HAS TAKEN  AND  SYMPTOMS ARE WORSE . UNSURE IF SHE HAS VAG D/C

## 2011-08-15 ENCOUNTER — Encounter (HOSPITAL_COMMUNITY): Payer: Self-pay | Admitting: *Deleted

## 2011-08-15 MED ORDER — LIDOCAINE HCL 2 % EX GEL
Freq: Once | CUTANEOUS | Status: AC
  Administered 2011-08-15: 1 via TOPICAL
  Filled 2011-08-15: qty 20

## 2011-08-15 MED ORDER — LIDOCAINE HCL 2 % EX GEL: CUTANEOUS | Status: DC | PRN

## 2011-08-15 NOTE — ED Provider Notes (Signed)
History   Pt presents today c/o continued vag pain and burning. She was recently seen in the MAU and treated for HSV. She states the medication is not working and she continues to have pain. She denies vag bleeding, fever, or any other sx at this time.  No chief complaint on file.  HPI  OB History    Grav Para Term Preterm Abortions TAB SAB Ect Mult Living   2 2 2       2       Past Medical History  Diagnosis Date  . Hypertension   . Asthma   . COPD (chronic obstructive pulmonary disease)   . Anxiety   . Cancer   . Colon cancer   . Dyslipidemia   . Migraine headache   . Chronic bronchitis   . Uterine fibroid   . Ovarian cyst   . GERD (gastroesophageal reflux disease)   . Morbid obesity   . Obstructive sleep apnea     mild  . Chronic pain   . Chronic back pain   . Arthritis   . Osteoarthritis   . Depression     Past Surgical History  Procedure Date  . Knee arthroscopy     bil.  . Carpal tunnel release     rt  . Mouth surgery     teeth extraction  . Cesarean section   . Subtotal colectomy   . Colonoscopy   . Tubal ligation     Family History  Problem Relation Age of Onset  . Uterine cancer Mother   . Stroke Father   . Colon cancer Maternal Uncle   . Diabetes Father   . Ovarian cancer Mother   . Hypertension Father   . Breast cancer Maternal Grandmother   . Diabetes Sister   . Asthma Child   . Asthma Child     History  Substance Use Topics  . Smoking status: Current Some Day Smoker -- 1.0 packs/day for 30 years    Types: Cigarettes  . Smokeless tobacco: Never Used   Comment: tobacco info given 07/21/2011  . Alcohol Use: No    Allergies:  Allergies  Allergen Reactions  . Aspirin Nausea Only  . Kiwi Extract Hives and Swelling    Prescriptions prior to admission  Medication Sig Dispense Refill  . acyclovir (ZOVIRAX) 400 MG tablet Take 1 tablet (400 mg total) by mouth 3 (three) times daily.  30 tablet  0  . ALPRAZolam (XANAX) 0.25 MG tablet  Take 0.25 mg by mouth 2 (two) times daily. anxiety       . clonazePAM (KLONOPIN) 0.5 MG tablet Take 1 tablet by mouth Daily.      . CYMBALTA 60 MG capsule Take 1 tablet by mouth Daily.      Marland Kitchen FLUoxetine (PROZAC) 20 MG capsule Take 1 tablet by mouth Daily.      . hydrochlorothiazide (HYDRODIURIL) 25 MG tablet Take 25 mg by mouth daily.        Marland Kitchen ibuprofen (ADVIL,MOTRIN) 800 MG tablet Take 800 mg by mouth 2 (two) times daily.        . indomethacin (INDOCIN) 25 MG capsule Take 1 tablet by mouth Daily.      . metroNIDAZOLE (FLAGYL) 500 MG tablet Take 1 tablet (500 mg total) by mouth 3 (three) times daily.  14 tablet  0  . omeprazole (PRILOSEC) 40 MG capsule Take 40 mg by mouth daily.        Marland Kitchen oxyCODONE-acetaminophen (PERCOCET) 10-325 MG per  tablet Take 1 tablet by mouth every 8 (eight) hours as needed. For pain      . traZODone (DESYREL) 50 MG tablet Take 1 tablet by mouth Daily.        Review of Systems  Constitutional: Negative for fever and chills.  Eyes: Negative for blurred vision and double vision.  Respiratory: Negative for cough, hemoptysis, sputum production, shortness of breath and wheezing.   Cardiovascular: Negative for chest pain and palpitations.  Gastrointestinal: Negative for nausea, vomiting, abdominal pain, diarrhea and constipation.  Genitourinary: Negative for dysuria, urgency, frequency and hematuria.  Skin: Negative for rash.  Neurological: Negative for dizziness and headaches.  Psychiatric/Behavioral: Negative for depression and suicidal ideas.   Physical Exam   Blood pressure 117/50, pulse 96, temperature 97.6 F (36.4 C), temperature source Oral, resp. rate 18, height 4\' 11"  (1.499 m), weight 249 lb (112.946 kg), last menstrual period 05/24/2003.  Physical Exam  Nursing note and vitals reviewed. Constitutional: She is oriented to person, place, and time. She appears well-developed and well-nourished. No distress.  HENT:  Head: Normocephalic and atraumatic.  Eyes:  EOM are normal. Pupils are equal, round, and reactive to light.  Genitourinary:    There is lesion on the right labia. There is lesion on the left labia.  Neurological: She is alert and oriented to person, place, and time.  Skin: Skin is warm and dry. She is not diaphoretic.  Psychiatric: She has a normal mood and affect. Her behavior is normal. Judgment and thought content normal.    MAU Course  Procedures  Pt has already had GC/Chlamydia, HSV, and wet prep done at last visit to MAU.  Lidocaine jelly applied to vagina.  Pt sx improving after application of lidocaine jelly.  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 Will give Rx for lidocaine jelly to use sparingly as needed. She will f/u with her PCP. Discussed safe sex practices. Discussed diet, activity, risks, and precautions.  Clinton Gallant. Estephania Licciardi III, DrHSc, MPAS, PA-C  08/15/2011, 1:33 AM   Henrietta Hoover, PA 08/15/11 0145

## 2011-09-01 DIAGNOSIS — J45909 Unspecified asthma, uncomplicated: Secondary | ICD-10-CM | POA: Diagnosis not present

## 2011-10-02 DIAGNOSIS — E669 Obesity, unspecified: Secondary | ICD-10-CM | POA: Diagnosis not present

## 2011-10-02 DIAGNOSIS — J45909 Unspecified asthma, uncomplicated: Secondary | ICD-10-CM | POA: Diagnosis not present

## 2011-10-02 DIAGNOSIS — G8929 Other chronic pain: Secondary | ICD-10-CM | POA: Diagnosis not present

## 2011-10-02 DIAGNOSIS — F411 Generalized anxiety disorder: Secondary | ICD-10-CM | POA: Diagnosis not present

## 2011-10-15 ENCOUNTER — Emergency Department (HOSPITAL_COMMUNITY)
Admission: EM | Admit: 2011-10-15 | Discharge: 2011-10-16 | Disposition: A | Discharge status: Home / Self Care | Payer: Medicare Other | Attending: Emergency Medicine | Admitting: Emergency Medicine

## 2011-10-15 ENCOUNTER — Encounter (HOSPITAL_COMMUNITY): Payer: Self-pay | Admitting: Family Medicine

## 2011-10-15 ENCOUNTER — Emergency Department (HOSPITAL_COMMUNITY): Payer: Medicare Other

## 2011-10-15 DIAGNOSIS — Z79899 Other long term (current) drug therapy: Secondary | ICD-10-CM | POA: Insufficient documentation

## 2011-10-15 DIAGNOSIS — K219 Gastro-esophageal reflux disease without esophagitis: Secondary | ICD-10-CM | POA: Insufficient documentation

## 2011-10-15 DIAGNOSIS — G4733 Obstructive sleep apnea (adult) (pediatric): Secondary | ICD-10-CM | POA: Diagnosis not present

## 2011-10-15 DIAGNOSIS — J449 Chronic obstructive pulmonary disease, unspecified: Secondary | ICD-10-CM | POA: Insufficient documentation

## 2011-10-15 DIAGNOSIS — Z8739 Personal history of other diseases of the musculoskeletal system and connective tissue: Secondary | ICD-10-CM | POA: Diagnosis not present

## 2011-10-15 DIAGNOSIS — J4489 Other specified chronic obstructive pulmonary disease: Secondary | ICD-10-CM | POA: Insufficient documentation

## 2011-10-15 DIAGNOSIS — J45901 Unspecified asthma with (acute) exacerbation: Secondary | ICD-10-CM

## 2011-10-15 DIAGNOSIS — G8929 Other chronic pain: Secondary | ICD-10-CM | POA: Diagnosis not present

## 2011-10-15 DIAGNOSIS — F411 Generalized anxiety disorder: Secondary | ICD-10-CM | POA: Insufficient documentation

## 2011-10-15 DIAGNOSIS — R0602 Shortness of breath: Secondary | ICD-10-CM | POA: Diagnosis not present

## 2011-10-15 DIAGNOSIS — R062 Wheezing: Secondary | ICD-10-CM | POA: Diagnosis not present

## 2011-10-15 DIAGNOSIS — J45909 Unspecified asthma, uncomplicated: Secondary | ICD-10-CM | POA: Diagnosis not present

## 2011-10-15 DIAGNOSIS — R9389 Abnormal findings on diagnostic imaging of other specified body structures: Secondary | ICD-10-CM | POA: Diagnosis not present

## 2011-10-15 DIAGNOSIS — R079 Chest pain, unspecified: Secondary | ICD-10-CM | POA: Diagnosis not present

## 2011-10-15 LAB — BLOOD GAS, ARTERIAL
Bicarbonate: 29.4 mEq/L — ABNORMAL HIGH (ref 20.0–24.0)
Drawn by: 313061
O2 Content: 2 L/min
O2 Saturation: 97.1 %
pCO2 arterial: 46.7 mmHg — ABNORMAL HIGH (ref 35.0–45.0)
pO2, Arterial: 90.6 mmHg (ref 80.0–100.0)

## 2011-10-15 LAB — DIFFERENTIAL
Eosinophils Absolute: 0.2 10*3/uL (ref 0.0–0.7)
Eosinophils Relative: 2 % (ref 0–5)
Lymphocytes Relative: 39 % (ref 12–46)
Lymphs Abs: 3.3 10*3/uL (ref 0.7–4.0)
Monocytes Relative: 9 % (ref 3–12)
Neutrophils Relative %: 50 % (ref 43–77)

## 2011-10-15 LAB — RAPID URINE DRUG SCREEN, HOSP PERFORMED
Benzodiazepines: POSITIVE — AB
Cocaine: NOT DETECTED
Opiates: POSITIVE — AB

## 2011-10-15 LAB — CBC
Hemoglobin: 13.4 g/dL (ref 12.0–15.0)
MCH: 32.9 pg (ref 26.0–34.0)
MCV: 98.3 fL (ref 78.0–100.0)
Platelets: 215 10*3/uL (ref 150–400)
RBC: 4.07 MIL/uL (ref 3.87–5.11)

## 2011-10-15 MED ORDER — ALBUTEROL (5 MG/ML) CONTINUOUS INHALATION SOLN
10.0000 mg/h | INHALATION_SOLUTION | RESPIRATORY_TRACT | Status: DC
  Administered 2011-10-15: 10 mg/h via RESPIRATORY_TRACT

## 2011-10-15 MED ORDER — ALBUTEROL SULFATE (5 MG/ML) 0.5% IN NEBU
5.0000 mg | INHALATION_SOLUTION | Freq: Once | RESPIRATORY_TRACT | Status: AC
  Administered 2011-10-15: 5 mg via RESPIRATORY_TRACT
  Filled 2011-10-15: qty 1

## 2011-10-15 MED ORDER — METHYLPREDNISOLONE SODIUM SUCC 125 MG IJ SOLR
125.0000 mg | Freq: Once | INTRAMUSCULAR | Status: AC
  Administered 2011-10-15: 125 mg via INTRAVENOUS
  Filled 2011-10-15: qty 2

## 2011-10-15 MED ORDER — SODIUM CHLORIDE 0.9 % IV BOLUS (SEPSIS)
1000.0000 mL | Freq: Once | INTRAVENOUS | Status: AC
  Administered 2011-10-15: 1000 mL via INTRAVENOUS

## 2011-10-15 MED ORDER — IPRATROPIUM BROMIDE 0.02 % IN SOLN
0.5000 mg | Freq: Once | RESPIRATORY_TRACT | Status: AC
  Administered 2011-10-15: 0.5 mg via RESPIRATORY_TRACT
  Filled 2011-10-15: qty 2.5

## 2011-10-15 MED ORDER — PREDNISONE 10 MG PO TABS: 20.0000 mg | ORAL_TABLET | Freq: Every day | ORAL | Status: DC

## 2011-10-15 NOTE — ED Provider Notes (Signed)
Medical screening examination/treatment/procedure(s) were conducted as a shared visit with non-physician practitioner(s) and myself.  I personally evaluated the patient during the encounter Smoker, c/o sob and cough and chronic back pain.  PE anxious, diffuse wheezing, somnolent.  Will tx with nebs.  Anticipate admission.    Cheri Guppy, MD 10/15/11 [phone]

## 2011-10-15 NOTE — ED Provider Notes (Signed)
History     CSN: [id number]  Arrival date & time 10/15/11  1757   First MD Initiated Contact with Patient 10/15/11 1957      Chief Complaint  Patient presents with  . Shortness of Breath    (Consider location/radiation/quality/duration/timing/severity/associated sxs/prior treatment) HPI Comments: This patient presents to the emergency room with the complaint of shortness of breath.  She does have a history of asthma.  She states she has been using her albuterol inhaler, and her nebulizer increasing use over the last 2 days.  At the time of the exam.  Patient is very somnolent and difficult to keep awake for interview slurring her words answering inappropriately asking for pain medicine for her back.  She does have a history of chronic pain and currently is taking 10 mg Percocets  Patient is a 56 y.o. female presenting with shortness of breath. The history is provided by the patient and the spouse.  Shortness of Breath  The current episode started yesterday. The problem occurs frequently. The problem has been gradually worsening. The problem is moderate. The symptoms are relieved by nothing. Associated symptoms include shortness of breath and wheezing. Pertinent negatives include no chest pain, no fever, no rhinorrhea, no sore throat and no cough.    Past Medical History  Diagnosis Date  . Hypertension   . Asthma   . COPD (chronic obstructive pulmonary disease)   . Anxiety   . Cancer   . Colon cancer   . Dyslipidemia   . Migraine headache   . Chronic bronchitis   . Uterine fibroid   . Ovarian cyst   . GERD (gastroesophageal reflux disease)   . Morbid obesity   . Obstructive sleep apnea     mild  . Chronic pain   . Chronic back pain   . Arthritis   . Osteoarthritis   . Depression     Past Surgical History  Procedure Date  . Knee arthroscopy     bil.  . Carpal tunnel release     rt  . Mouth surgery     teeth extraction  . Cesarean section   . Subtotal colectomy   .  Colonoscopy   . Tubal ligation     Family History  Problem Relation Age of Onset  . Uterine cancer Mother   . Stroke Father   . Colon cancer Maternal Uncle   . Diabetes Father   . Ovarian cancer Mother   . Hypertension Father   . Breast cancer Maternal Grandmother   . Diabetes Sister   . Asthma Child   . Asthma Child     History  Substance Use Topics  . Smoking status: Current Some Day Smoker -- 1.0 packs/day for 30 years    Types: Cigarettes  . Smokeless tobacco: Never Used   Comment: tobacco info given 07/21/2011  . Alcohol Use: No    OB History    Grav Para Term Preterm Abortions TAB SAB Ect Mult Living   2 2 2       2       Review of Systems  Constitutional: Negative for fever and chills.  HENT: Negative for sore throat, rhinorrhea, neck pain and neck stiffness.   Respiratory: Positive for shortness of breath and wheezing. Negative for cough.   Cardiovascular: Negative for chest pain.  Musculoskeletal: Positive for back pain.  Neurological: Positive for weakness.    Allergies  Aspirin and Kiwi extract  Home Medications   Current Outpatient Rx  Name Route  Sig Dispense Refill  . ALPRAZOLAM 0.25 MG PO TABS Oral Take 0.25 mg by mouth 2 (two) times daily. anxiety     . CLONAZEPAM 0.5 MG PO TABS Oral Take 1 tablet by mouth Daily.    . CYMBALTA 60 MG PO CPEP Oral Take 1 tablet by mouth Daily.    Marland Kitchen FLUOXETINE HCL 20 MG PO CAPS Oral Take 1 tablet by mouth Daily.    Marland Kitchen HYDROCHLOROTHIAZIDE 25 MG PO TABS Oral Take 25 mg by mouth daily.      . IBUPROFEN 800 MG PO TABS Oral Take 800 mg by mouth 2 (two) times daily.      . INDOMETHACIN 25 MG PO CAPS Oral Take 1 tablet by mouth Daily.    Marland Kitchen LIDOCAINE HCL 2 % EX GEL Topical Apply topically as needed. 30 mL 0  . OMEPRAZOLE 40 MG PO CPDR Oral Take 40 mg by mouth daily.      . OXYCODONE-ACETAMINOPHEN 10-325 MG PO TABS Oral Take 1 tablet by mouth every 8 (eight) hours as needed. For pain    . PREDNISONE 10 MG PO TABS Oral Take 2  tablets (20 mg total) by mouth daily. 15 tablet 0  . TRAZODONE HCL 50 MG PO TABS Oral Take 1 tablet by mouth Daily.      BP 115/52  Pulse 87  Temp(Src) 98.8 F (37.1 C) (Axillary)  Resp 16  SpO2 98%  LMP 05/24/2003  Physical Exam  Constitutional: She appears well-developed and well-nourished.  HENT:  Head: Normocephalic.  Eyes: Pupils are equal, round, and reactive to light.  Cardiovascular: Normal rate.   Pulmonary/Chest: Effort normal. She has wheezes. She exhibits no tenderness.  Abdominal: Soft.  Musculoskeletal: Normal range of motion.  Neurological:       Patient is very somnolent, difficult to arouse when she does arouse is having trouble following a conversation is able to answer with one word answers, but she does attempt to speak in a sense.  It did resolve after one or 2 words with her having to be aroused again  Skin: Skin is warm.    ED Course  Procedures (including critical care time)  Labs Reviewed  BLOOD GAS, ARTERIAL - Abnormal; Notable for the following:    pH, Arterial 7.415 (*)    pCO2 arterial 46.7 (*)    Bicarbonate 29.4 (*)    Acid-Base Excess 4.5 (*)    All other components within normal limits  URINE RAPID DRUG SCREEN (HOSP PERFORMED) - Abnormal; Notable for the following:    Opiates POSITIVE (*)    Benzodiazepines POSITIVE (*)    Amphetamines POSITIVE (*)    All other components within normal limits  CBC  DIFFERENTIAL   Dg Chest 2 View  10/15/2011  *RADIOLOGY REPORT*  Clinical Data: Chest and back pain, shortness of breath, hypertension  CHEST - 2 VIEW  Comparison: 07/28/2011  Findings:  Vascular clips in the upper abdomen.  Mild bilateral diffuse interstitial prominence.  No focal airspace consolidation. No overt interstitial edema.  No effusion.  Borderline cardiomegaly.  IMPRESSION: 1.  Stable mild diffuse interstitial prominence, probably chronic. 2.  Borderline cardiomegaly.  Original Report Authenticated By: Thora Lance III, M.D.      1. Asthma attack     11:50 PM patient is sleeping.  Awakened.  No respiratory distress  MDM  Patient is having respiratory wheezing.  That has not responded to her first course of albuterol, Atrovent, and steroids.  We'll repeat this.  I  am  also concerned for a drug overdose, as she takes chronic pain medication, and she is very somnolent and hard to arouse        Arman Filter, NP 10/15/11 2358

## 2011-10-15 NOTE — Discharge Instructions (Signed)
Asthma, Adult Asthma is a disease of the lungs and can make it hard to breathe. Asthma cannot be cured, but medicine can help control it. Asthma may be started (triggered) by:  Pollen.   Dust.   Animal skin flakes (dander).   Molds.   Foods.   Respiratory infections (colds, flu).   Smoke.   Exercise.   Stress.   Other things that cause allergic reactions or allergies (allergens).  HOME CARE   Talk to your doctor about how to manage your attacks at home. This may include:   Using a tool called a peak flow meter.   Having medicine ready to stop the attack.   Take all medicine as told by your doctor.   Wash bed sheets and blankets every week in hot water and put them in the dryer.   Drink enough fluids to keep your pee (urine) clear or pale yellow.   Always be ready to get emergency help. Write down the phone number for your doctor. Keep it where you can easily find it.   Talk about exercise routines with your doctor.   If animal dander is causing your asthma, you may need to find a new home for your pet(s).  GET HELP RIGHT AWAY IF:   You have muscle aches.   You cough more.   You have chest pain.   You have thick spit (sputum) that changes to yellow, green, gray, or bloody.   Medicine does not stop your wheezing.   You have problems breathing.   You have a fever.   Your medicine causes:   A rash.   Itching.   Puffiness (swelling).   Breathing problems.  MAKE SURE YOU:   Understand these instructions.   Will watch your condition.   Will get help right away if you are not doing well or get worse.  Document Released: 11/12/2007 Document Revised: 05/15/2011 Document Reviewed: 04/05/2008 [facility] Patient Information 2012 [place], [place]. Make sure she used her albuterol inhaler every 4-6 hours as needed.  For next 2 days has also been prescribed prednisone.  Please take this as directed until completed follow up with her primary care doctor as  needed return to the emergency room for any respiratory distress

## 2011-10-15 NOTE — ED Provider Notes (Signed)
Medical screening examination/treatment/procedure(s) were conducted as a shared visit with non-physician practitioner(s) and myself.  I personally evaluated the patient during the encounter  Cheri Guppy, MD 10/16/11 0000

## 2011-10-21 ENCOUNTER — Emergency Department (HOSPITAL_COMMUNITY): Payer: Medicare Other

## 2011-10-21 ENCOUNTER — Inpatient Hospital Stay (HOSPITAL_COMMUNITY)
Admission: EM | Admit: 2011-10-21 | Discharge: 2011-10-24 | DRG: 191 | Disposition: A | Discharge status: Home / Self Care | Payer: Medicare Other | Attending: Internal Medicine | Admitting: Internal Medicine

## 2011-10-21 ENCOUNTER — Encounter (HOSPITAL_COMMUNITY): Payer: Self-pay | Admitting: *Deleted

## 2011-10-21 DIAGNOSIS — F329 Major depressive disorder, single episode, unspecified: Secondary | ICD-10-CM | POA: Diagnosis not present

## 2011-10-21 DIAGNOSIS — E876 Hypokalemia: Secondary | ICD-10-CM

## 2011-10-21 DIAGNOSIS — M25519 Pain in unspecified shoulder: Secondary | ICD-10-CM | POA: Diagnosis not present

## 2011-10-21 DIAGNOSIS — F17200 Nicotine dependence, unspecified, uncomplicated: Secondary | ICD-10-CM | POA: Diagnosis not present

## 2011-10-21 DIAGNOSIS — E785 Hyperlipidemia, unspecified: Secondary | ICD-10-CM

## 2011-10-21 DIAGNOSIS — G8929 Other chronic pain: Secondary | ICD-10-CM | POA: Diagnosis present

## 2011-10-21 DIAGNOSIS — K219 Gastro-esophageal reflux disease without esophagitis: Secondary | ICD-10-CM | POA: Diagnosis not present

## 2011-10-21 DIAGNOSIS — Z6841 Body Mass Index (BMI) 40.0 and over, adult: Secondary | ICD-10-CM | POA: Diagnosis not present

## 2011-10-21 DIAGNOSIS — R9389 Abnormal findings on diagnostic imaging of other specified body structures: Secondary | ICD-10-CM | POA: Diagnosis not present

## 2011-10-21 DIAGNOSIS — J441 Chronic obstructive pulmonary disease with (acute) exacerbation: Secondary | ICD-10-CM | POA: Diagnosis present

## 2011-10-21 DIAGNOSIS — Z9889 Other specified postprocedural states: Secondary | ICD-10-CM

## 2011-10-21 DIAGNOSIS — I10 Essential (primary) hypertension: Secondary | ICD-10-CM

## 2011-10-21 DIAGNOSIS — I493 Ventricular premature depolarization: Secondary | ICD-10-CM

## 2011-10-21 DIAGNOSIS — J45909 Unspecified asthma, uncomplicated: Secondary | ICD-10-CM | POA: Diagnosis not present

## 2011-10-21 DIAGNOSIS — Z8500 Personal history of malignant neoplasm of unspecified digestive organ: Secondary | ICD-10-CM

## 2011-10-21 DIAGNOSIS — R0602 Shortness of breath: Secondary | ICD-10-CM | POA: Diagnosis not present

## 2011-10-21 DIAGNOSIS — R197 Diarrhea, unspecified: Secondary | ICD-10-CM

## 2011-10-21 DIAGNOSIS — Z720 Tobacco use: Secondary | ICD-10-CM

## 2011-10-21 DIAGNOSIS — M1990 Unspecified osteoarthritis, unspecified site: Secondary | ICD-10-CM

## 2011-10-21 DIAGNOSIS — F411 Generalized anxiety disorder: Secondary | ICD-10-CM | POA: Diagnosis present

## 2011-10-21 DIAGNOSIS — I059 Rheumatic mitral valve disease, unspecified: Secondary | ICD-10-CM | POA: Diagnosis not present

## 2011-10-21 DIAGNOSIS — J45901 Unspecified asthma with (acute) exacerbation: Secondary | ICD-10-CM | POA: Diagnosis present

## 2011-10-21 DIAGNOSIS — F32A Depression, unspecified: Secondary | ICD-10-CM

## 2011-10-21 DIAGNOSIS — F419 Anxiety disorder, unspecified: Secondary | ICD-10-CM

## 2011-10-21 DIAGNOSIS — F3289 Other specified depressive episodes: Secondary | ICD-10-CM | POA: Diagnosis present

## 2011-10-21 LAB — CBC
HCT: 39.3 % (ref 36.0–46.0)
MCH: 32.8 pg (ref 26.0–34.0)
MCHC: 33.8 g/dL (ref 30.0–36.0)
MCV: 96.8 fL (ref 78.0–100.0)
Platelets: 179 10*3/uL (ref 150–400)
RDW: 13.5 % (ref 11.5–15.5)

## 2011-10-21 LAB — BASIC METABOLIC PANEL
BUN: 7 mg/dL (ref 6–23)
CO2: 30 mEq/L (ref 19–32)
Calcium: 8.7 mg/dL (ref 8.4–10.5)
Chloride: 99 mEq/L (ref 96–112)
Creatinine, Ser: 0.74 mg/dL (ref 0.50–1.10)
Glucose, Bld: 86 mg/dL (ref 70–99)

## 2011-10-21 LAB — HEPATIC FUNCTION PANEL
ALT: 13 U/L (ref 0–35)
Alkaline Phosphatase: 78 U/L (ref 39–117)
Bilirubin, Direct: <0.1 mg/dL (ref 0.0–0.3)
Total Protein: 7.4 g/dL (ref 6.0–8.3)

## 2011-10-21 LAB — DIFFERENTIAL
Basophils Absolute: 0 10*3/uL (ref 0.0–0.1)
Basophils Relative: 0 % (ref 0–1)
Eosinophils Absolute: 0.2 10*3/uL (ref 0.0–0.7)
Eosinophils Relative: 2 % (ref 0–5)
Lymphocytes Relative: 29 % (ref 12–46)
Monocytes Absolute: 1.1 10*3/uL — ABNORMAL HIGH (ref 0.1–1.0)

## 2011-10-21 MED ORDER — SODIUM CHLORIDE 0.9 % IV SOLN
INTRAVENOUS | Status: AC
  Administered 2011-10-21: 19:00:00 via INTRAVENOUS

## 2011-10-21 MED ORDER — SODIUM CHLORIDE 0.9 % IV SOLN
Freq: Once | INTRAVENOUS | Status: AC
  Administered 2011-10-21: 1000 mL via INTRAVENOUS

## 2011-10-21 MED ORDER — ALBUTEROL SULFATE (5 MG/ML) 0.5% IN NEBU
5.0000 mg | INHALATION_SOLUTION | Freq: Once | RESPIRATORY_TRACT | Status: AC
  Administered 2011-10-21: 5 mg via RESPIRATORY_TRACT
  Filled 2011-10-21: qty 1

## 2011-10-21 MED ORDER — ONDANSETRON HCL 4 MG PO TABS: 4.0000 mg | ORAL_TABLET | Freq: Four times a day (QID) | ORAL | Status: DC | PRN

## 2011-10-21 MED ORDER — ACETAMINOPHEN 650 MG RE SUPP: 650.0000 mg | Freq: Four times a day (QID) | RECTAL | Status: DC | PRN

## 2011-10-21 MED ORDER — OXYCODONE-ACETAMINOPHEN 10-325 MG PO TABS: 1.0000 | ORAL_TABLET | Freq: Four times a day (QID) | ORAL | Status: DC | PRN

## 2011-10-21 MED ORDER — OXYCODONE-ACETAMINOPHEN 5-325 MG PO TABS
2.0000 | ORAL_TABLET | Freq: Once | ORAL | Status: AC
  Administered 2011-10-21: 2 via ORAL
  Filled 2011-10-21: qty 2

## 2011-10-21 MED ORDER — OXYCODONE HCL 5 MG PO TABS
5.0000 mg | ORAL_TABLET | Freq: Four times a day (QID) | ORAL | Status: DC | PRN
  Administered 2011-10-21 – 2011-10-24 (×10): 5 mg via ORAL
  Filled 2011-10-21 (×9): qty 1

## 2011-10-21 MED ORDER — ONDANSETRON HCL 4 MG/2ML IJ SOLN
4.0000 mg | Freq: Four times a day (QID) | INTRAMUSCULAR | Status: DC | PRN
  Administered 2011-10-24: 4 mg via INTRAVENOUS
  Filled 2011-10-21: qty 2

## 2011-10-21 MED ORDER — FLUOXETINE HCL 20 MG PO CAPS
20.0000 mg | ORAL_CAPSULE | Freq: Every day | ORAL | Status: DC
  Administered 2011-10-22 – 2011-10-24 (×3): 20 mg via ORAL
  Filled 2011-10-21 (×3): qty 1

## 2011-10-21 MED ORDER — POTASSIUM CHLORIDE CRYS ER 20 MEQ PO TBCR
40.0000 meq | EXTENDED_RELEASE_TABLET | ORAL | Status: AC
  Administered 2011-10-21 – 2011-10-22 (×3): 40 meq via ORAL
  Filled 2011-10-21 (×3): qty 2

## 2011-10-21 MED ORDER — TRAZODONE HCL 50 MG PO TABS
50.0000 mg | ORAL_TABLET | Freq: Every day | ORAL | Status: DC
  Administered 2011-10-21 – 2011-10-24 (×3): 50 mg via ORAL
  Filled 2011-10-21 (×5): qty 1

## 2011-10-21 MED ORDER — PANTOPRAZOLE SODIUM 40 MG PO TBEC
40.0000 mg | DELAYED_RELEASE_TABLET | Freq: Every day | ORAL | Status: DC
  Administered 2011-10-22 – 2011-10-24 (×3): 40 mg via ORAL
  Filled 2011-10-21 (×4): qty 1

## 2011-10-21 MED ORDER — ALBUTEROL SULFATE (5 MG/ML) 0.5% IN NEBU
5.0000 mg | INHALATION_SOLUTION | Freq: Once | RESPIRATORY_TRACT | Status: AC
Start: 2011-10-21 — End: 2011-10-21
  Administered 2011-10-21: 5 mg via RESPIRATORY_TRACT
  Filled 2011-10-21: qty 1

## 2011-10-21 MED ORDER — ALBUTEROL (5 MG/ML) CONTINUOUS INHALATION SOLN
10.0000 mg/h | INHALATION_SOLUTION | Freq: Once | RESPIRATORY_TRACT | Status: AC
  Administered 2011-10-21: 10 mg/h via RESPIRATORY_TRACT

## 2011-10-21 MED ORDER — AZELASTINE HCL 0.1 % NA SOLN
1.0000 | Freq: Two times a day (BID) | NASAL | Status: DC
  Administered 2011-10-21 – 2011-10-24 (×5): 1 via NASAL
  Filled 2011-10-21: qty 30

## 2011-10-21 MED ORDER — ALBUTEROL SULFATE (5 MG/ML) 0.5% IN NEBU
INHALATION_SOLUTION | RESPIRATORY_TRACT | Status: AC
  Administered 2011-10-21: 10 mg/h via RESPIRATORY_TRACT
  Filled 2011-10-21: qty 2

## 2011-10-21 MED ORDER — HYDROCHLOROTHIAZIDE 25 MG PO TABS
25.0000 mg | ORAL_TABLET | Freq: Every day | ORAL | Status: DC
  Administered 2011-10-22 – 2011-10-24 (×3): 25 mg via ORAL
  Filled 2011-10-21 (×3): qty 1

## 2011-10-21 MED ORDER — METHYLPREDNISOLONE SODIUM SUCC 125 MG IJ SOLR
60.0000 mg | Freq: Three times a day (TID) | INTRAMUSCULAR | Status: DC
  Administered 2011-10-21 – 2011-10-23 (×6): 60 mg via INTRAVENOUS
  Filled 2011-10-21 (×9): qty 0.96

## 2011-10-21 MED ORDER — ENOXAPARIN SODIUM 60 MG/0.6ML ~~LOC~~ SOLN
60.0000 mg | SUBCUTANEOUS | Status: DC
  Administered 2011-10-21 – 2011-10-23 (×3): 60 mg via SUBCUTANEOUS
  Filled 2011-10-21 (×4): qty 0.6

## 2011-10-21 MED ORDER — MORPHINE SULFATE 2 MG/ML IJ SOLN
1.0000 mg | Freq: Four times a day (QID) | INTRAMUSCULAR | Status: DC | PRN
  Administered 2011-10-21 – 2011-10-24 (×9): 1 mg via INTRAVENOUS
  Filled 2011-10-21 (×9): qty 1

## 2011-10-21 MED ORDER — PNEUMOCOCCAL VAC POLYVALENT 25 MCG/0.5ML IJ INJ
0.5000 mL | INJECTION | INTRAMUSCULAR | Status: AC | PRN
  Administered 2011-10-24: 0.5 mL via INTRAMUSCULAR
  Filled 2011-10-21: qty 0.5

## 2011-10-21 MED ORDER — METHYLPREDNISOLONE SODIUM SUCC 125 MG IJ SOLR
125.0000 mg | Freq: Once | INTRAMUSCULAR | Status: AC
  Administered 2011-10-21: 125 mg via INTRAVENOUS
  Filled 2011-10-21: qty 2

## 2011-10-21 MED ORDER — NICOTINE 21 MG/24HR TD PT24
21.0000 mg | MEDICATED_PATCH | Freq: Every day | TRANSDERMAL | Status: DC
  Administered 2011-10-22 – 2011-10-24 (×3): 21 mg via TRANSDERMAL
  Filled 2011-10-21 (×3): qty 1

## 2011-10-21 MED ORDER — IPRATROPIUM BROMIDE 0.02 % IN SOLN
0.5000 mg | Freq: Once | RESPIRATORY_TRACT | Status: AC
  Administered 2011-10-21: 0.5 mg via RESPIRATORY_TRACT
  Filled 2011-10-21: qty 2.5

## 2011-10-21 MED ORDER — NICOTINE 21 MG/24HR TD PT24
21.0000 mg | MEDICATED_PATCH | Freq: Once | TRANSDERMAL | Status: AC
  Administered 2011-10-21: 21 mg via TRANSDERMAL
  Filled 2011-10-21: qty 1

## 2011-10-21 MED ORDER — LORATADINE 10 MG PO TABS
10.0000 mg | ORAL_TABLET | Freq: Every day | ORAL | Status: DC
  Administered 2011-10-21 – 2011-10-24 (×4): 10 mg via ORAL
  Filled 2011-10-21 (×4): qty 1

## 2011-10-21 MED ORDER — ACETAMINOPHEN 325 MG PO TABS: 650.0000 mg | ORAL_TABLET | Freq: Four times a day (QID) | ORAL | Status: DC | PRN

## 2011-10-21 MED ORDER — ENOXAPARIN SODIUM 40 MG/0.4ML ~~LOC~~ SOLN: 40.0000 mg | SUBCUTANEOUS | Status: DC

## 2011-10-21 MED ORDER — HYDROMORPHONE HCL PF 1 MG/ML IJ SOLN
1.0000 mg | Freq: Once | INTRAMUSCULAR | Status: AC
  Administered 2011-10-21: 1 mg via INTRAVENOUS
  Filled 2011-10-21: qty 1

## 2011-10-21 MED ORDER — CLONAZEPAM 0.5 MG PO TABS
0.5000 mg | ORAL_TABLET | Freq: Two times a day (BID) | ORAL | Status: DC | PRN
  Administered 2011-10-22 – 2011-10-24 (×5): 0.5 mg via ORAL
  Filled 2011-10-21 (×5): qty 1

## 2011-10-21 MED ORDER — OXYCODONE-ACETAMINOPHEN 5-325 MG PO TABS
1.0000 | ORAL_TABLET | Freq: Four times a day (QID) | ORAL | Status: DC | PRN
  Administered 2011-10-21 – 2011-10-24 (×10): 1 via ORAL
  Filled 2011-10-21 (×10): qty 1

## 2011-10-21 NOTE — ED Notes (Signed)
Iv attempted x2 without success. Charge Diane, RN will try for IV line. Blood obtained.

## 2011-10-21 NOTE — ED Notes (Signed)
Admission MD at pt bedside.  

## 2011-10-21 NOTE — ED Notes (Signed)
Expiratory wheezing in all fields. Cough is non-productive and dry.  Pt daughter reports the patient also has panic attacks and had one yesterday,  Pt took a xanax and improved until last night when constant coughing began again followed by shortness of breath.

## 2011-10-21 NOTE — ED Notes (Signed)
Resp called for breathing treatment.   

## 2011-10-21 NOTE — H&P (Signed)
PCP:   Dr. Roseanne Reno [facility])   Chief Complaint:  Rhinorrhea/nasal congestion, shortness of breath, nonproductive cough and increased wheezing.   HPI: 56 year old female with a past medical history significant for hypertension, history of asthma/COPD, tobacco abuse, anxiety/depression, dyslipidemia, morbidity obesity and remote history of colon cancer status post resection; who came into the hospital secondary to shortness of breath, nonproductive cough and increased wheezing. Patient reports that her has been present for the last 5 months approximately and is slowly worsening. She endorses that about week ago she was seen in the emergency department where she received nebulizer treatment and a prescription for prednisone, with improvement of her symptoms and subsequently discharged home. After finishing taking medications she reports that her symptoms very much return but this time having difficulty putting catch her breath. In the emergency department patient was seen and despite 2 long nebulizer treatment and a shot of Solu Medrol she continued to be tachypneic and with diffuse wheezing. [facility] was called to admit patient for further regimen treatment. Of note patient still smoking.  Patient denies any chest pain, denies any headache, abdominal pain, nausea/vomiting, melena, hematochezia, diarrhea or any other acute complaints.    Allergies:   Allergies  Allergen Reactions  . Aspirin Nausea Only  . Kiwi Extract Hives and Swelling      Past Medical History  Diagnosis Date  . Hypertension   . Asthma   . COPD (chronic obstructive pulmonary disease)   . Anxiety   . Cancer   . Colon cancer   . Dyslipidemia   . Migraine headache   . Chronic bronchitis   . Uterine fibroid   . Ovarian cyst   . GERD (gastroesophageal reflux disease)   . Morbid obesity   . Obstructive sleep apnea     mild  . Chronic pain   . Chronic back pain   . Arthritis   . Osteoarthritis     . Depression     Past Surgical History  Procedure Date  . Knee arthroscopy     bil.  . Carpal tunnel release     rt  . Mouth surgery     teeth extraction  . Cesarean section   . Subtotal colectomy   . Colonoscopy   . Tubal ligation     Prior to Admission medications   Medication Sig Start Date End Date Taking? Authorizing Provider  acetaminophen (TYLENOL) 500 MG tablet Take 1,000 mg by mouth every 6 (six) hours as needed. For pain   Yes Historical Provider, MD  albuterol (PROVENTIL HFA;VENTOLIN HFA) 108 (90 BASE) MCG/ACT inhaler Inhale 2 puffs into the lungs every 6 (six) hours as needed. For shortness of breeath   Yes Historical Provider, MD  ALPRAZolam (XANAX) 0.25 MG tablet Take 0.25 mg by mouth 2 (two) times daily. anxiety    Yes Historical Provider, MD  clonazePAM (KLONOPIN) 0.5 MG tablet Take 1 tablet by mouth Daily. 06/12/11  Yes Historical Provider, MD  FLUoxetine (PROZAC) 20 MG capsule Take 1 tablet by mouth Daily. 06/12/11  Yes Historical Provider, MD  hydrochlorothiazide (HYDRODIURIL) 25 MG tablet Take 25 mg by mouth daily.     Yes Historical Provider, MD  ibuprofen (ADVIL,MOTRIN) 800 MG tablet Take 800 mg by mouth 2 (two) times daily.     Yes Historical Provider, MD  indomethacin (INDOCIN) 25 MG capsule Take 1 tablet by mouth Daily. 06/11/11  Yes Historical Provider, MD  mometasone (NASONEX) 50 MCG/ACT nasal spray Place 2 sprays into the  nose daily.   Yes Historical Provider, MD  omeprazole (PRILOSEC) 40 MG capsule Take 40 mg by mouth daily.     Yes Historical Provider, MD  oxyCODONE-acetaminophen (PERCOCET) 10-325 MG per tablet Take 1 tablet by mouth every 8 (eight) hours as needed. For pain   Yes Historical Provider, MD  predniSONE (DELTASONE) 10 MG tablet Take 2 tablets (20 mg total) by mouth daily. 10/15/11  Yes Arman Filter, NP  traZODone (DESYREL) 50 MG tablet Take 1 tablet by mouth Daily. 06/12/11  Yes Historical Provider, MD    Social History:  reports that she has  been smoking Cigarettes.  She has a 30 pack-year smoking history. She has never used smokeless tobacco. She reports that she does not drink alcohol or use illicit drugs.  Family History  Problem Relation Age of Onset  . Uterine cancer Mother   . Stroke Father   . Colon cancer Maternal Uncle   . Diabetes Father   . Ovarian cancer Mother   . Hypertension Father   . Breast cancer Maternal Grandmother   . Diabetes Sister   . Asthma Child   . Asthma Child     Review of Systems:  Rhinorrhea, nasal congestion, shortness of breath, increased wheezing. Otherwise negative except.   Physical Exam: Blood pressure 114/82, pulse 80, temperature 98 F (36.7 C), temperature source Oral, resp. rate 18, last menstrual period 05/24/2003, SpO2 94.00%. Constitutional: Obese, AAF, oriented to person, place, and time. Appears in no distress and able to speak in full sentences.  HENT:  Head: Normocephalic and atraumatic.  Eyes: Conjunctivae, EOM and lids are normal. Pupils are equal, round, and reactive to light.  Neck: Normal range of motion. Neck supple. No tracheal deviation present. No mass present.  Cardiovascular: Normal rate, regular rhythm and normal heart sounds. Exam reveals no gallop.  No murmur heard.  Pulmonary/Chest: No stridor. Prolonged expiratory phase, diffuse wheezing and decreased air movement. No crackles, no rhonchi.   Abdominal: Soft.  Nontender, nondistended, positive bowel sounds; no guarding or rebound.  Musculoskeletal: Normal range of motion. She exhibits no edema and no tenderness.  Neurological: She is alert and oriented to person, place, and time. She has normal strength. No cranial nerve deficit or sensory deficit.  Able to follow commands properly. Skin: Skin is warm and dry. No abrasion and no rash noted.  Psychiatric: She has a normal mood and affect. Her speech is normal and behavior is normal.  Labs on Admission:  Results for orders placed during the hospital  encounter of 10/21/11 (from the past 48 hour(s))  BASIC METABOLIC PANEL     Status: Abnormal   Collection Time   10/21/11 11:20 AM      Component Value Range Comment   Sodium 138  135 - 145 (mEq/L)    Potassium 3.3 (*) 3.5 - 5.1 (mEq/L)    Chloride 99  96 - 112 (mEq/L)    CO2 30  19 - 32 (mEq/L)    Glucose, Bld 86  70 - 99 (mg/dL)    BUN 7  6 - 23 (mg/dL)    Creatinine, Ser 3.08  0.50 - 1.10 (mg/dL)    Calcium 8.7  8.4 - 10.5 (mg/dL)    GFR calc non Af Amer >90  >90 (mL/min)    GFR calc Af Amer >90  >90 (mL/min)   CBC     Status: Abnormal   Collection Time   10/21/11 11:55 AM      Component Value Range  Comment   WBC 11.7 (*) 4.0 - 10.5 (K/uL)    RBC 4.06  3.87 - 5.11 (MIL/uL)    Hemoglobin 13.3  12.0 - 15.0 (g/dL)    HCT 16.1  09.6 - 04.5 (%)    MCV 96.8  78.0 - 100.0 (fL)    MCH 32.8  26.0 - 34.0 (pg)    MCHC 33.8  30.0 - 36.0 (g/dL)    RDW 40.9  81.1 - 91.4 (%)    Platelets 179  150 - 400 (K/uL)   DIFFERENTIAL     Status: Abnormal   Collection Time   10/21/11 11:55 AM      Component Value Range Comment   Neutrophils Relative 60  43 - 77 (%)    Neutro Abs 7.1  1.7 - 7.7 (K/uL)    Lymphocytes Relative 29  12 - 46 (%)    Lymphs Abs 3.4  0.7 - 4.0 (K/uL)    Monocytes Relative 9  3 - 12 (%)    Monocytes Absolute 1.1 (*) 0.1 - 1.0 (K/uL)    Eosinophils Relative 2  0 - 5 (%)    Eosinophils Absolute 0.2  0.0 - 0.7 (K/uL)    Basophils Relative 0  0 - 1 (%)    Basophils Absolute 0.0  0.0 - 0.1 (K/uL)     Radiological Exams on Admission: Dg Chest 2 View  10/21/2011  *RADIOLOGY REPORT*  Clinical Data: Left shoulder pain.  Short of breath.  CHEST - 2 VIEW  Comparison: 10/15/2011  Findings: Borderline cardiomegaly.  Bronchitic changes.  No consolidation or mass.  No pleural effusion or pneumothorax.  IMPRESSION: Borderline cardiomegaly and bronchitic changes.  Original Report Authenticated By: Donavan Burnet, M.D.     Assessment/Plan 1-Asthma exacerbation in COPD: Patient with  history of asthma/COPD and ongoing tobacco abuse. Who came into the hospital complaining of increased shortness of breath and wheezing. Looking the patient home medications she is currently not taking any maintenance therapy, which will account as cause for her exacerbation, also continue tobacco abuse and flaring of her allergic component due to season. Will treat with IV Solu Medrol for her acute presentation; when necessary nebulizers, smoking cessation counseling; will also start flutter valve and plan for advair and combivent at discharge. She will need to be seen by pulmonary service at discharge for PFTs. Will use saline spray and daily claritin for rhinitis.  2-Morbid obesity: They might be a component of obesity hypoventilation syndrome/sleep apnea. Will use when necessary oxygen while she is in the hospital and will incorporate/arrange for a sleep study as an outpatient. Patient was informed of low calorie diet/exercise in order to lose weight.  3-Hypertension: Stable. Will continue HCTZ.  4-Dyslipidemia: Will check lipid panel and follow a heart healthy low fat diet. Depending lipid panel results will start appropriate treatment.  5-Esophageal reflux: Continue PPI (Protonix).  6-Hypokalemia: Most likely due to use of HCTZ. Will replete and check a magnesium level.  7-Tobacco abuse: Cessation counseling provided; patient is willing to quit. Will use nicotine patch while inpatient.  8-Arthritis: Continue when necessary medications.  9-Anxiety and Depression: Continue Prozac and when necessary Klonopin.  10-DVT: Lovenox.   Time Spent on Admission: 50 minutes  Corisa Montini [facility] [phone]  10/21/2011, 4:37 PM

## 2011-10-21 NOTE — ED Notes (Signed)
Pt daughter called and given the patient room number.

## 2011-10-21 NOTE — ED Notes (Signed)
Pt also complaining of left sided hip pain.  Pt ambulatory.

## 2011-10-21 NOTE — ED Notes (Signed)
Pt here for shortness of breath and cough for several weeks. Pt was seen here last week for the same.  Pt was given steroids.  Pt has a history of COPD and asthma. Pt reports symptoms started last night and became worse while laying down on back. Pt reports coughing increased.  Pt also diagnosed with Sleep Apnea and does not have a CPAP machine.  Pt reports using inhaler twice today.

## 2011-10-21 NOTE — ED Notes (Signed)
Attempted to give report. Nurse on phone with another patient issue.  Will call Annabelle Harman RN back at [phone]

## 2011-10-21 NOTE — ED Notes (Signed)
Pt daughter Darla Lesches would like to be called with the patient's room number at [phone].

## 2011-10-21 NOTE — ED Provider Notes (Addendum)
History     CSN: [id number]  Arrival date & time 10/21/11  1013   First MD Initiated Contact with Patient 10/21/11 1028      Chief Complaint  Patient presents with  . Cough  . Shortness of Breath    (Consider location/radiation/quality/duration/timing/severity/associated sxs/prior treatment) Patient is a 56 y.o. female presenting with cough and shortness of breath. The history is provided by the patient.  Cough This is a recurrent problem. The current episode started more than 1 week ago. The problem occurs hourly. The problem has been rapidly worsening. The cough is non-productive. There has been no fever. Associated symptoms include rhinorrhea, shortness of breath and wheezing. Pertinent negatives include no chills and no ear congestion. The treatment provided mild relief. She is a smoker. Her past medical history is significant for COPD.  Shortness of Breath  Associated symptoms include rhinorrhea, cough, shortness of breath and wheezing.    Past Medical History  Diagnosis Date  . Hypertension   . Asthma   . COPD (chronic obstructive pulmonary disease)   . Anxiety   . Cancer   . Colon cancer   . Dyslipidemia   . Migraine headache   . Chronic bronchitis   . Uterine fibroid   . Ovarian cyst   . GERD (gastroesophageal reflux disease)   . Morbid obesity   . Obstructive sleep apnea     mild  . Chronic pain   . Chronic back pain   . Arthritis   . Osteoarthritis   . Depression     Past Surgical History  Procedure Date  . Knee arthroscopy     bil.  . Carpal tunnel release     rt  . Mouth surgery     teeth extraction  . Cesarean section   . Subtotal colectomy   . Colonoscopy   . Tubal ligation     Family History  Problem Relation Age of Onset  . Uterine cancer Mother   . Stroke Father   . Colon cancer Maternal Uncle   . Diabetes Father   . Ovarian cancer Mother   . Hypertension Father   . Breast cancer Maternal Grandmother   . Diabetes Sister   .  Asthma Child   . Asthma Child     History  Substance Use Topics  . Smoking status: Current Some Day Smoker -- 1.0 packs/day for 30 years    Types: Cigarettes  . Smokeless tobacco: Never Used   Comment: tobacco info given 07/21/2011  . Alcohol Use: No    OB History    Grav Para Term Preterm Abortions TAB SAB Ect Mult Living   2 2 2       2       Review of Systems  Constitutional: Negative for chills.  HENT: Positive for rhinorrhea.   Respiratory: Positive for cough, shortness of breath and wheezing.   All other systems reviewed and are negative.    Allergies  Aspirin and Kiwi extract  Home Medications   Current Outpatient Rx  Name Route Sig Dispense Refill  . ALPRAZOLAM 0.25 MG PO TABS Oral Take 0.25 mg by mouth 2 (two) times daily. anxiety     . CLONAZEPAM 0.5 MG PO TABS Oral Take 1 tablet by mouth Daily.    . CYMBALTA 60 MG PO CPEP Oral Take 1 tablet by mouth Daily.    Marland Kitchen FLUOXETINE HCL 20 MG PO CAPS Oral Take 1 tablet by mouth Daily.    Marland Kitchen HYDROCHLOROTHIAZIDE 25 MG PO  TABS Oral Take 25 mg by mouth daily.      . IBUPROFEN 800 MG PO TABS Oral Take 800 mg by mouth 2 (two) times daily.      . INDOMETHACIN 25 MG PO CAPS Oral Take 1 tablet by mouth Daily.    Marland Kitchen LIDOCAINE HCL 2 % EX GEL Topical Apply topically as needed. 30 mL 0  . OMEPRAZOLE 40 MG PO CPDR Oral Take 40 mg by mouth daily.      . OXYCODONE-ACETAMINOPHEN 10-325 MG PO TABS Oral Take 1 tablet by mouth every 8 (eight) hours as needed. For pain    . PREDNISONE 10 MG PO TABS Oral Take 2 tablets (20 mg total) by mouth daily. 15 tablet 0  . TRAZODONE HCL 50 MG PO TABS Oral Take 1 tablet by mouth Daily.      BP 122/88  Pulse 95  Temp(Src) 98 F (36.7 C) (Oral)  Resp 16  SpO2 97%  LMP 05/24/2003  Physical Exam  Nursing note and vitals reviewed. Constitutional: She is oriented to person, place, and time. She appears well-developed and well-nourished.  Non-toxic appearance. No distress.  HENT:  Head: Normocephalic  and atraumatic.  Eyes: Conjunctivae, EOM and lids are normal. Pupils are equal, round, and reactive to light.  Neck: Normal range of motion. Neck supple. No tracheal deviation present. No mass present.  Cardiovascular: Normal rate, regular rhythm and normal heart sounds.  Exam reveals no gallop.   No murmur heard. Pulmonary/Chest: No stridor. She has decreased breath sounds. She has wheezes. She has no rhonchi. She has no rales.  Abdominal: Soft. Normal appearance and bowel sounds are normal. She exhibits no distension. There is no tenderness. There is no rebound and no CVA tenderness.  Musculoskeletal: Normal range of motion. She exhibits no edema and no tenderness.  Neurological: She is alert and oriented to person, place, and time. She has normal strength. No cranial nerve deficit or sensory deficit. GCS eye subscore is 4. GCS verbal subscore is 5. GCS motor subscore is 6.  Skin: Skin is warm and dry. No abrasion and no rash noted.  Psychiatric: She has a normal mood and affect. Her speech is normal and behavior is normal.    ED Course  Procedures (including critical care time)   Labs Reviewed  CBC  DIFFERENTIAL  BASIC METABOLIC PANEL   No results found.   No diagnosis found.    MDM  Pt given multiple alb nebs and also dose of steroids, still tachypnic, will be admitted        Toy Baker, MD 10/21/11 1412  Toy Baker, MD 10/21/11 [phone]

## 2011-10-22 DIAGNOSIS — F17200 Nicotine dependence, unspecified, uncomplicated: Secondary | ICD-10-CM

## 2011-10-22 DIAGNOSIS — J441 Chronic obstructive pulmonary disease with (acute) exacerbation: Secondary | ICD-10-CM

## 2011-10-22 DIAGNOSIS — I10 Essential (primary) hypertension: Secondary | ICD-10-CM

## 2011-10-22 DIAGNOSIS — J45901 Unspecified asthma with (acute) exacerbation: Secondary | ICD-10-CM

## 2011-10-22 DIAGNOSIS — R0602 Shortness of breath: Secondary | ICD-10-CM

## 2011-10-22 LAB — BASIC METABOLIC PANEL
Calcium: 8.2 mg/dL — ABNORMAL LOW (ref 8.4–10.5)
GFR calc non Af Amer: >90 mL/min (ref >90)
Potassium: 4.5 mEq/L (ref 3.5–5.1)
Sodium: 135 mEq/L (ref 135–145)

## 2011-10-22 LAB — CBC
Hemoglobin: 12.7 g/dL (ref 12.0–15.0)
MCH: 32.8 pg (ref 26.0–34.0)
MCHC: 33 g/dL (ref 30.0–36.0)
Platelets: 198 10*3/uL (ref 150–400)
RBC: 3.87 MIL/uL (ref 3.87–5.11)

## 2011-10-22 LAB — GLUCOSE, CAPILLARY: Glucose-Capillary: 108 mg/dL — ABNORMAL HIGH (ref 70–99)

## 2011-10-22 LAB — LIPID PANEL
HDL: 77 mg/dL (ref >39)
LDL Cholesterol: 65 mg/dL (ref 0–99)
Total CHOL/HDL Ratio: 2.1 RATIO
Triglycerides: 94 mg/dL (ref <150)

## 2011-10-22 LAB — TSH: TSH: 1.584 u[IU]/mL (ref 0.350–4.500)

## 2011-10-22 MED ORDER — ALPRAZOLAM 0.5 MG PO TABS
0.5000 mg | ORAL_TABLET | Freq: Every evening | ORAL | Status: DC | PRN
  Administered 2011-10-22 – 2011-10-24 (×2): 0.5 mg via ORAL
  Filled 2011-10-22 (×2): qty 1

## 2011-10-22 MED ORDER — HYDROCOD POLST-CHLORPHEN POLST 10-8 MG/5ML PO LQCR
5.0000 mL | Freq: Two times a day (BID) | ORAL | Status: DC | PRN
  Administered 2011-10-22 – 2011-10-24 (×4): 5 mL via ORAL
  Filled 2011-10-22 (×4): qty 5

## 2011-10-22 NOTE — Progress Notes (Signed)
Subjective: Patient states that she's still having wheezing. She admits that she is currently still smoking but trying to cut down on her tobacco use. The patient reports that she started having symptoms approximately 2 weeks ago and was seen in the emergency room and sent home steroids. She felt that taking a sterile fashion and her symptoms worsen of the better and it is unclear as to whether or not the patient completed a course of steroids. Presently the patient is utilizing oxygen however the patient was observed ambulating the hall today without any increased work of breathing. I had a long discussion with the patient regarding the effects of continued smoking and bronchitis and limitation that this presents an managing her bronchitis/COPD effectively.  Patient has requested to her diabetes to regular diet resonant heart healthy. Objective: Filed Vitals:   10/21/11 1808 10/21/11 2245 10/22/11 0601 10/22/11 1429  BP: 112/73 124/78 133/85 129/84  Pulse: 86 79 64 69  Temp: 97.9 F (36.6 C) 97.8 F (36.6 C) 98.4 F (36.9 C) 97.7 F (36.5 C)  TempSrc: Oral Oral Oral Oral  Resp: 20 20 20 16   Height: 4\' 8"  (1.422 m)     Weight: 113.9 kg (251 lb 1.7 oz)     SpO2: 94% 95% 98% 100%   Weight change:   Intake/Output Summary (Last 24 hours) at 10/22/11 2106 Last data filed at 10/22/11 1834  Gross per 24 hour  Intake   1200 ml  Output      0 ml  Net   1200 ml    General: Alert, awake, oriented x3, in no acute distress. Resting comfortably with no increased work of breathing HEENT: [place]/AT PEERL, EOMI Neck: Trachea midline,  no masses, no thyromegal,y no JVD, no carotid bruit OROPHARYNX:  Moist, No exudate/ erythema/lesions.  Heart: Regular rate and rhythm, without murmurs, rubs, gallops, PMI non-displaced, no heaves or thrills on palpation.  Lungs: Diffuse wheezing but no rhonchi noted.  Abdomen: Soft, nontender, nondistended, positive bowel sounds, no masses no hepatosplenomegaly noted..    Neuro: No focal neurological deficits noted cranial nerves II through XII grossly intact. DTRs 2+ bilaterally upper and lower extremities. Strength functional in bilateral upper and lower extremities. Musculoskeletal: No warm swelling or erythema around joints, no spinal tenderness noted. Psychiatric: Patient alert and oriented x3, good insight and cognition, good recent to remote recall.   Lab Results:  Basename 10/22/11 0553 10/21/11 1120  NA 135 138  K 4.5 3.3*  CL 102 99  CO2 25 30  GLUCOSE 148* 86  BUN 9 7  CREATININE 0.58 0.74  CALCIUM 8.2* 8.7  MG -- --  PHOS -- --    Basename 10/21/11 1120  AST 17  ALT 13  ALKPHOS 78  BILITOT 0.3  PROT 7.4  ALBUMIN 3.4*   No results found for this basename: LIPASE:2,AMYLASE:2 in the last 72 hours  Basename 10/22/11 0553 10/21/11 1155  WBC 11.7* 11.7*  NEUTROABS -- 7.1  HGB 12.7 13.3  HCT 38.5 39.3  MCV 99.5 96.8  PLT 198 179   No results found for this basename: CKTOTAL:3,CKMB:3,CKMBINDEX:3,TROPONINI:3 in the last 72 hours No components found with this basename: POCBNP:3 No results found for this basename: DDIMER:2 in the last 72 hours No results found for this basename: HGBA1C:2 in the last 72 hours  Basename 10/22/11 0553  CHOL 161  HDL 77  LDLCALC 65  TRIG 94  CHOLHDL 2.1  LDLDIRECT --    Basename 10/21/11 1120  TSH 1.584  T4TOTAL --  T3FREE --  THYROIDAB --   No results found for this basename: VITAMINB12:2,FOLATE:2,FERRITIN:2,TIBC:2,IRON:2,RETICCTPCT:2 in the last 72 hours  Micro Results: No results found for this or any previous visit (from the past 240 hour(s)).  Studies/Results: Dg Chest 2 View  10/21/2011  *RADIOLOGY REPORT*  Clinical Data: Left shoulder pain.  Short of breath.  CHEST - 2 VIEW  Comparison: 10/15/2011  Findings: Borderline cardiomegaly.  Bronchitic changes.  No consolidation or mass.  No pleural effusion or pneumothorax.  IMPRESSION: Borderline cardiomegaly and bronchitic changes.   Original Report Authenticated By: Donavan Burnet, M.D.   Dg Chest 2 View  10/15/2011  *RADIOLOGY REPORT*  Clinical Data: Chest and back pain, shortness of breath, hypertension  CHEST - 2 VIEW  Comparison: 07/28/2011  Findings:  Vascular clips in the upper abdomen.  Mild bilateral diffuse interstitial prominence.  No focal airspace consolidation. No overt interstitial edema.  No effusion.  Borderline cardiomegaly.  IMPRESSION: 1.  Stable mild diffuse interstitial prominence, probably chronic. 2.  Borderline cardiomegaly.  Original Report Authenticated By: Osa Craver, M.D.    Medications: I have reviewed the patient's current medications. Scheduled Meds:   . azelastine  1 spray Each Nare BID  . enoxaparin (LOVENOX) injection  60 mg Subcutaneous Q24H  . FLUoxetine  20 mg Oral Daily  . hydrochlorothiazide  25 mg Oral Daily  . loratadine  10 mg Oral Daily  . methylPREDNISolone (SOLU-MEDROL) injection  60 mg Intravenous Q8H  . nicotine  21 mg Transdermal Once  . nicotine  21 mg Transdermal Daily  . pantoprazole  40 mg Oral Q1200  . potassium chloride  40 mEq Oral Q4H  . traZODone  50 mg Oral QHS   Continuous Infusions:   . sodium chloride 75 mL/hr at 10/21/11 1902   PRN Meds:.acetaminophen, acetaminophen, ALPRAZolam, clonazePAM, morphine injection, ondansetron (ZOFRAN) IV, ondansetron, oxyCODONE, oxyCODONE-acetaminophen, pneumococcal 23 valent vaccine Assessment/Plan: Patient Active Hospital Problem List: Asthma exacerbation in COPD (10/21/2011)   Assessment: Patient is presently on IV Solu-Medrol beta agonist. We will continue that for the next 24 hours and reevaluate the patient's clinical condition.    Hypertension ()   Assessment: Well controlled.   Dyslipidemia ()   Assessment: Chronic condition. Please note that patient has refused diet that has been recommended.     Esophageal reflux (07/30/2011)   Assessment: Continue protonix    Hypokalemia (10/21/2011)    Assessment: Resolved   Tobacco abuse (10/21/2011)   Assessment: Pt counseled against further tobacco use.    Anxiety (10/21/2011)   Assessment: Pt on Klonopin   Plan: Will not resume scheduled xanax.  Depression (10/21/2011)   Assessment: continue Prozac.     LOS: 1 day

## 2011-10-23 ENCOUNTER — Encounter: Payer: Self-pay | Admitting: Gastroenterology

## 2011-10-23 DIAGNOSIS — R0602 Shortness of breath: Secondary | ICD-10-CM

## 2011-10-23 DIAGNOSIS — J45901 Unspecified asthma with (acute) exacerbation: Secondary | ICD-10-CM

## 2011-10-23 DIAGNOSIS — F17200 Nicotine dependence, unspecified, uncomplicated: Secondary | ICD-10-CM

## 2011-10-23 DIAGNOSIS — J441 Chronic obstructive pulmonary disease with (acute) exacerbation: Secondary | ICD-10-CM

## 2011-10-23 DIAGNOSIS — I10 Essential (primary) hypertension: Secondary | ICD-10-CM

## 2011-10-23 LAB — BASIC METABOLIC PANEL
BUN: 12 mg/dL (ref 6–23)
Calcium: 9.4 mg/dL (ref 8.4–10.5)
GFR calc Af Amer: >90 mL/min (ref >90)
GFR calc non Af Amer: >90 mL/min (ref >90)
Potassium: 4.2 mEq/L (ref 3.5–5.1)
Sodium: 138 mEq/L (ref 135–145)

## 2011-10-23 MED ORDER — PREDNISONE 50 MG PO TABS
60.0000 mg | ORAL_TABLET | Freq: Every day | ORAL | Status: DC
  Administered 2011-10-24: 60 mg via ORAL
  Filled 2011-10-23 (×2): qty 1

## 2011-10-23 NOTE — Telephone Encounter (Signed)
Error

## 2011-10-23 NOTE — Progress Notes (Signed)
Pt oxygen saturation on room air is 100%.  Pt is complaining of SOB and wheezing, but tolerated ambulation fair.  Will continue to monitor patient.

## 2011-10-23 NOTE — Progress Notes (Signed)
UR completed 

## 2011-10-23 NOTE — Progress Notes (Signed)
Subjective: Patient has been ambulating up and down the halls today and her and with her pulse ox is 90% on room air. The patient is concerned that she is unable to lay down and sleep at night and feels as though she is smothering. I will obtain a 2-D echocardiogram on this patient to evaluate her wheezing as she may have a cardiac component to her shortness of breath and wheezing. Patient states that she's had a speech study which shows that she has obstructive sleep apnea. We attempted to get the results of that study to assess the patient and obtaining a CPAP if that has been recommended.   Objective: Filed Vitals:   10/23/11 0635 10/23/11 0700 10/23/11 1021 10/23/11 1415  BP: 131/81   117/59  Pulse: 58   77  Temp: 97.5 F (36.4 C)   97.6 F (36.4 C)  TempSrc: Oral   Oral  Resp: 20   20  Height:      Weight:      SpO2: 99% 97% 99% 100%   Weight change:   Intake/Output Summary (Last 24 hours) at 10/23/11 1611 Last data filed at 10/23/11 0200  Gross per 24 hour  Intake   1200 ml  Output      0 ml  Net   1200 ml    General: Alert, awake, oriented x3, in no acute distress. Resting comfortably with no increased work of breathing HEENT: [place]/AT PEERL, EOMI Neck: Trachea midline,  no masses, no thyromegal,y no JVD, no carotid bruit OROPHARYNX:  Moist, No exudate/ erythema/lesions.  Heart: Regular rate and rhythm, without murmurs, rubs, gallops, PMI non-displaced, no heaves or thrills on palpation.  Lungs: Diffuse wheezing but no rhonchi noted.  Abdomen: Soft, nontender, nondistended, positive bowel sounds, no masses no hepatosplenomegaly noted..  Neuro: No focal neurological deficits noted cranial nerves II through XII grossly intact. DTRs 2+ bilaterally upper and lower extremities. Strength functional in bilateral upper and lower extremities. Musculoskeletal: No warm swelling or erythema around joints, no spinal tenderness noted. Psychiatric: Patient alert and oriented x3.   Lab  Results:  [facility] 10/23/11 0513 10/22/11 0553  NA 138 135  K 4.2 4.5  CL 100 102  CO2 30 25  GLUCOSE 166* 148*  BUN 12 9  CREATININE 0.62 0.58  CALCIUM 9.4 8.2*  MG -- --  PHOS -- --    Basename 10/21/11 1120  AST 17  ALT 13  ALKPHOS 78  BILITOT 0.3  PROT 7.4  ALBUMIN 3.4*   No results found for this basename: LIPASE:2,AMYLASE:2 in the last 72 hours  Basename 10/22/11 0553 10/21/11 1155  WBC 11.7* 11.7*  NEUTROABS -- 7.1  HGB 12.7 13.3  HCT 38.5 39.3  MCV 99.5 96.8  PLT 198 179   No results found for this basename: CKTOTAL:3,CKMB:3,CKMBINDEX:3,TROPONINI:3 in the last 72 hours No components found with this basename: POCBNP:3 No results found for this basename: DDIMER:2 in the last 72 hours No results found for this basename: HGBA1C:2 in the last 72 hours  Basename 10/22/11 0553  CHOL 161  HDL 77  LDLCALC 65  TRIG 94  CHOLHDL 2.1  LDLDIRECT --    Basename 10/21/11 1120  TSH 1.584  T4TOTAL --  T3FREE --  THYROIDAB --   No results found for this basename: VITAMINB12:2,FOLATE:2,FERRITIN:2,TIBC:2,IRON:2,RETICCTPCT:2 in the last 72 hours  Micro Results: No results found for this or any previous visit (from the past 240 hour(s)).  Studies/Results: Dg Chest 2 View  10/21/2011  *RADIOLOGY REPORT*  Clinical Data: Left shoulder pain.  Short of breath.  CHEST - 2 VIEW  Comparison: 10/15/2011  Findings: Borderline cardiomegaly.  Bronchitic changes.  No consolidation or mass.  No pleural effusion or pneumothorax.  IMPRESSION: Borderline cardiomegaly and bronchitic changes.  Original Report Authenticated By: Donavan Burnet, M.D.   Dg Chest 2 View  10/15/2011  *RADIOLOGY REPORT*  Clinical Data: Chest and back pain, shortness of breath, hypertension  CHEST - 2 VIEW  Comparison: 07/28/2011  Findings:  Vascular clips in the upper abdomen.  Mild bilateral diffuse interstitial prominence.  No focal airspace consolidation. No overt interstitial edema.  No effusion.  Borderline  cardiomegaly.  IMPRESSION: 1.  Stable mild diffuse interstitial prominence, probably chronic. 2.  Borderline cardiomegaly.  Original Report Authenticated By: Osa Craver, M.D.    Medications: I have reviewed the patient's current medications. Scheduled Meds:    . azelastine  1 spray Each Nare BID  . enoxaparin (LOVENOX) injection  60 mg Subcutaneous Q24H  . FLUoxetine  20 mg Oral Daily  . hydrochlorothiazide  25 mg Oral Daily  . loratadine  10 mg Oral Daily  . nicotine  21 mg Transdermal Once  . nicotine  21 mg Transdermal Daily  . pantoprazole  40 mg Oral Q1200  . predniSONE  60 mg Oral Q breakfast  . traZODone  50 mg Oral QHS  . DISCONTD: methylPREDNISolone (SOLU-MEDROL) injection  60 mg Intravenous Q8H   Continuous Infusions:  PRN Meds:.acetaminophen, acetaminophen, ALPRAZolam, chlorpheniramine-HYDROcodone, clonazePAM, morphine injection, ondansetron (ZOFRAN) IV, ondansetron, oxyCODONE, oxyCODONE-acetaminophen, pneumococcal 23 valent vaccine Assessment/Plan: Patient Active Hospital Problem List: Asthma exacerbation in COPD (10/21/2011)   Assessment: We'll transition patient to oral steroids today. We'll also obtain an overnight pulse ox on the patient to evaluate for need for oxygen while sleeping. The patient also complains that she's having difficulty sleeping at night and difficulty laying flat. I will get a 2-D echocardiogram to evaluate for left ventricular function I will also get a BNP.   Hypertension ()   Assessment: Well controlled.   Dyslipidemia ()   Assessment: Chronic condition. Please note that patient has refused diet that has been recommended.     Esophageal reflux (07/30/2011)   Assessment: Continue protonix    Hypokalemia (10/21/2011)   Assessment: Resolved   Tobacco abuse (10/21/2011)   Assessment: Pt counseled against further tobacco use.    Anxiety (10/21/2011)   Assessment: Pt on Klonopin   Plan: Will not resume scheduled xanax.  Depression  (10/21/2011)   Assessment: continue Prozac.     LOS: 2 days

## 2011-10-24 DIAGNOSIS — F17200 Nicotine dependence, unspecified, uncomplicated: Secondary | ICD-10-CM

## 2011-10-24 DIAGNOSIS — J441 Chronic obstructive pulmonary disease with (acute) exacerbation: Secondary | ICD-10-CM

## 2011-10-24 DIAGNOSIS — J45901 Unspecified asthma with (acute) exacerbation: Secondary | ICD-10-CM

## 2011-10-24 DIAGNOSIS — I10 Essential (primary) hypertension: Secondary | ICD-10-CM

## 2011-10-24 DIAGNOSIS — I059 Rheumatic mitral valve disease, unspecified: Secondary | ICD-10-CM

## 2011-10-24 DIAGNOSIS — R0602 Shortness of breath: Secondary | ICD-10-CM

## 2011-10-24 LAB — GLUCOSE, CAPILLARY: Glucose-Capillary: 84 mg/dL (ref 70–99)

## 2011-10-24 LAB — PRO B NATRIURETIC PEPTIDE: Pro B Natriuretic peptide (BNP): 226.9 pg/mL — ABNORMAL HIGH (ref 0–125)

## 2011-10-24 IMAGING — CT CT ANGIO CHEST
2 of 6 series · 19 of 36 positions shown · IV contrast (APPLIED)
Comparison: 04/14/2008.

CLINICAL DATA: Mid chest pain, cough, shortness of breath.

CT ANGIOGRAPHY CHEST WITH CONTRAST
TECHNIQUE: Multidetector CT imaging of the chest was performed
using the standard protocol during bolus administration of
intravenous contrast.  Multiplanar CT image reconstructions
including MIPs were obtained to evaluate the vascular anatomy.
Contrast:  100 ml of Omnipaque 350.

[Series 6: pe thins @ 1mm · axial · 0.65mm/px · z∈[-254,-60]mm · 18 of 217 slices shown]
[im 11/217  lung]
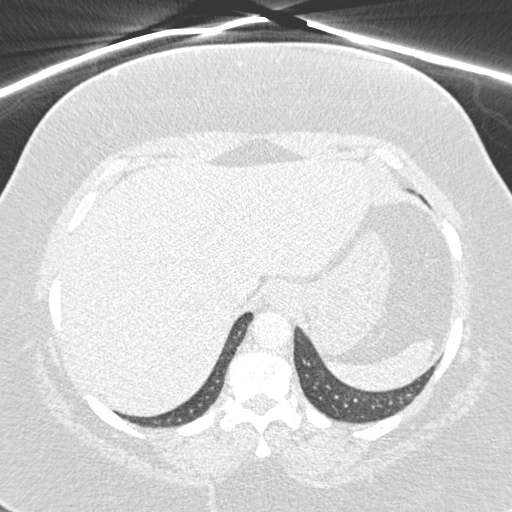
[im 22/217  mediastinal]
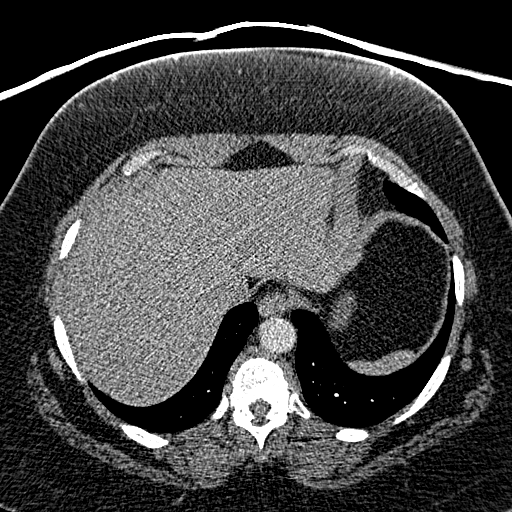
[im 33/217  lung]
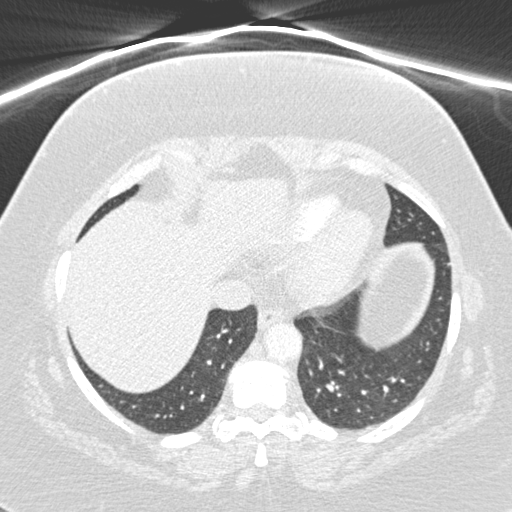
[im 44/217  mediastinal]
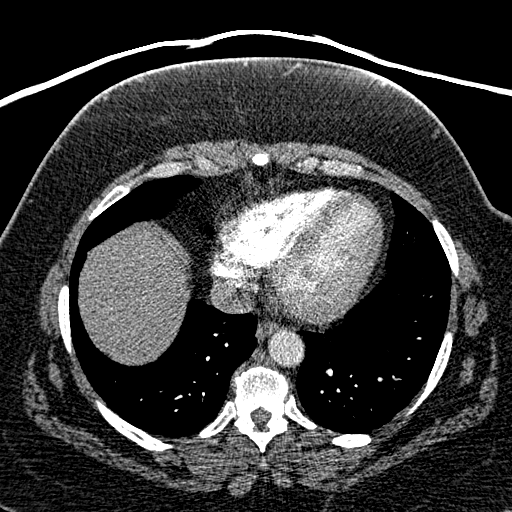
[im 55/217  lung]
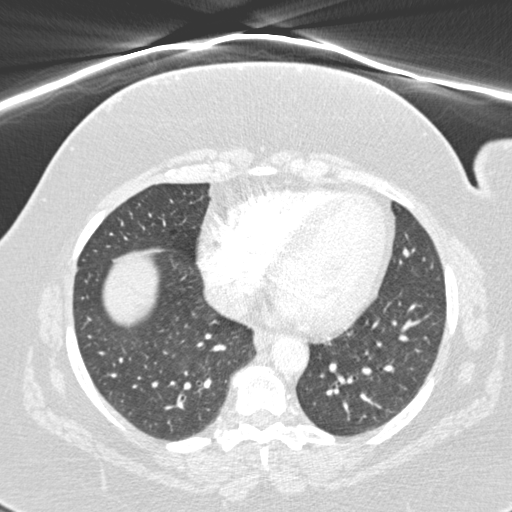
[im 65/217  mediastinal]
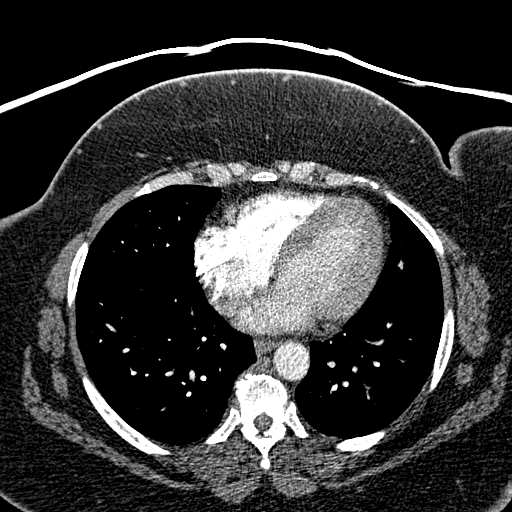
[im 76/217  lung]
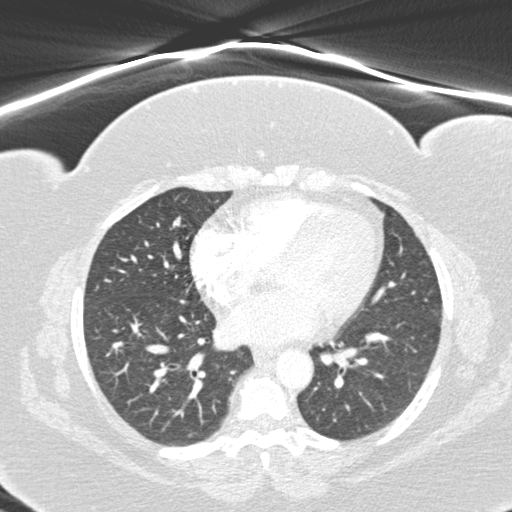
[im 87/217  mediastinal]
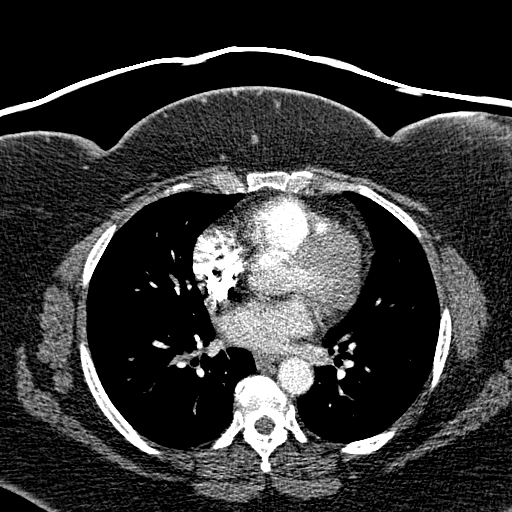
[im 98/217  lung]
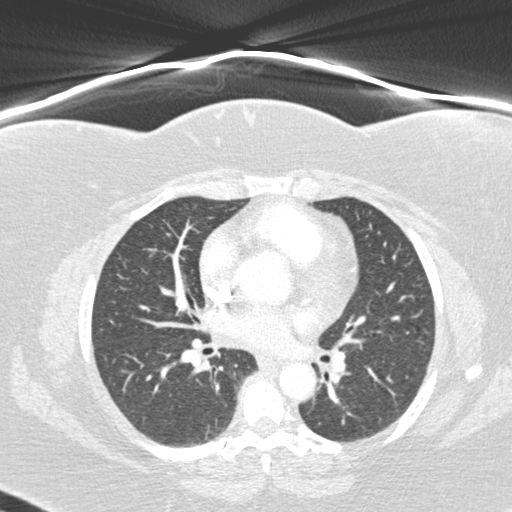
[im 119/217  mediastinal]
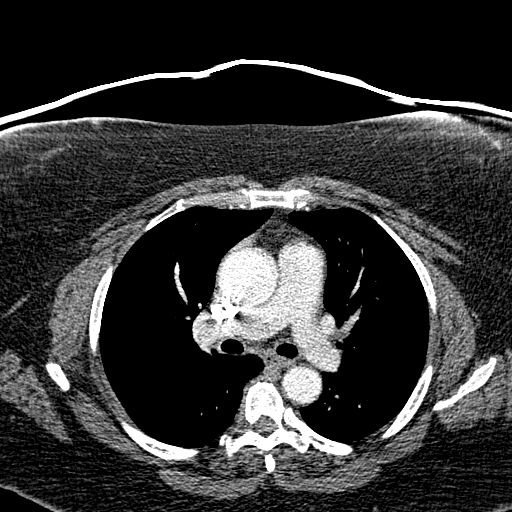
[im 130/217  lung]
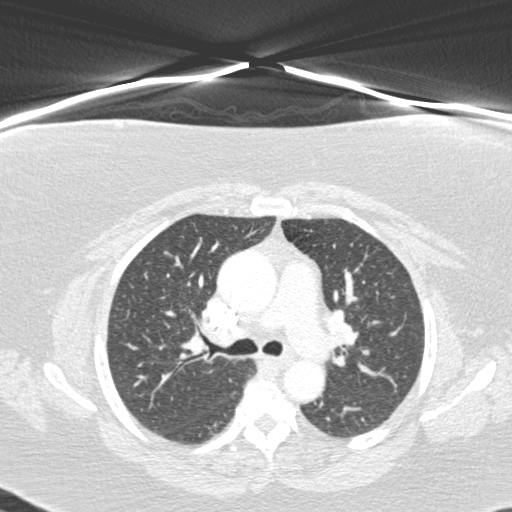
[im 141/217  mediastinal]
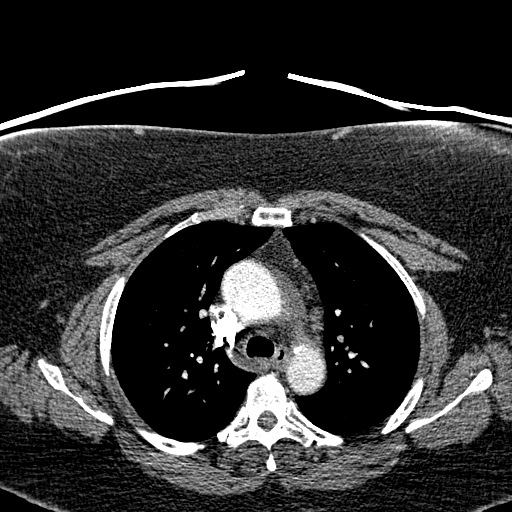
[im 152/217  lung]
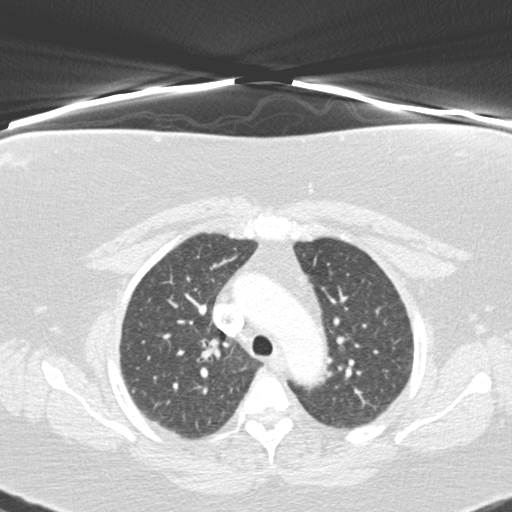
[im 163/217  mediastinal]
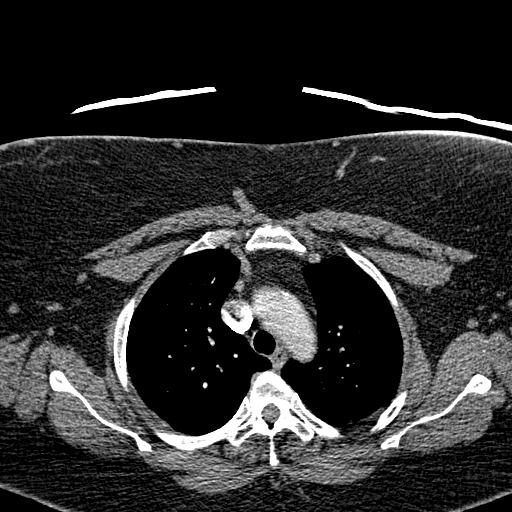
[im 173/217  lung]
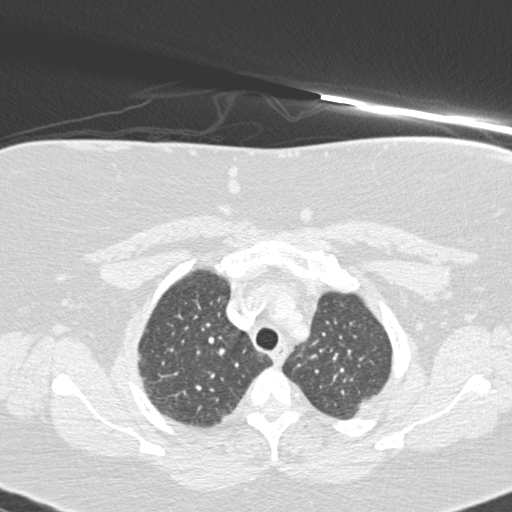
[im 184/217  mediastinal]
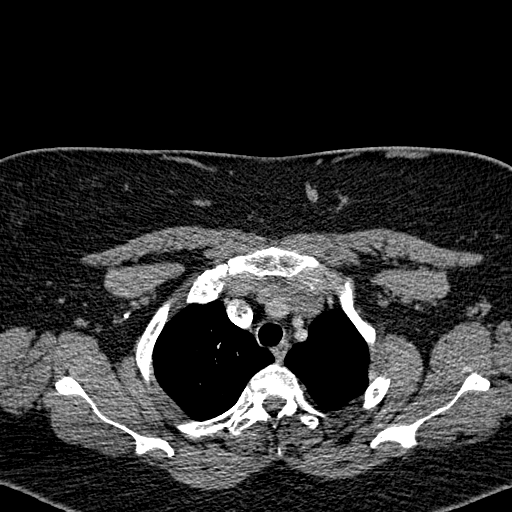
[im 195/217  lung]
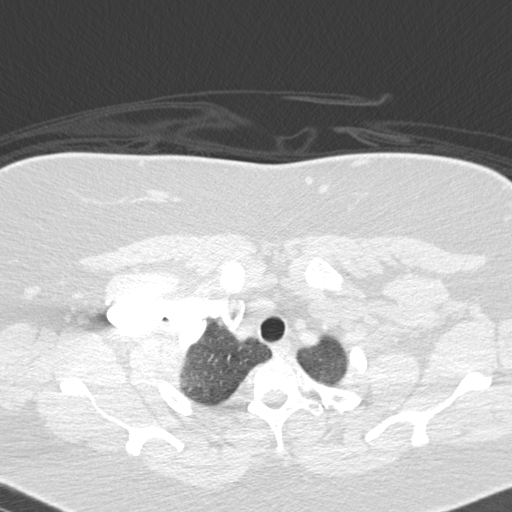
[im 206/217  mediastinal]
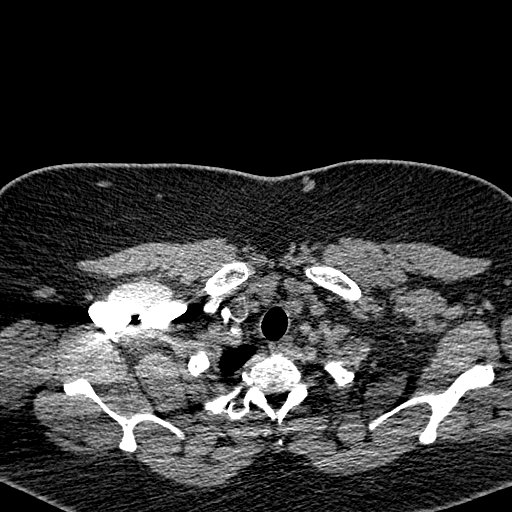

[Series 602: <mpr thick range> · coronal · 0.65mm/px · 1 of 104 slices shown]
[im 52/104  mediastinal]
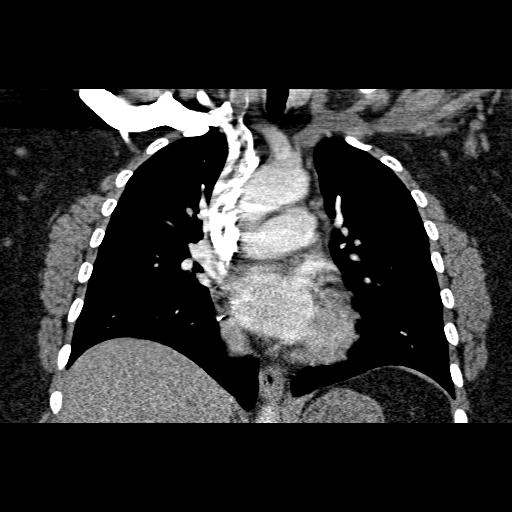

[19 of 36 positions shown; findings below may reference images not displayed]

FINDINGS: There is no CT scan evidence for pulmonary emboli.
There is no mediastinal or hilar adenopathy.  Bone windows settings
demonstrate no infiltrates or nodules.  The central airways are
patent.  Coronary artery calcification is again noted - unchanged.

Review of the MIP images confirms the above findings.
IMPRESSION: No CT scan evidence for pulmonary emboli.  No acute findings.

## 2011-10-24 IMAGING — CR DG CHEST 2V
2 series · 2 of 2 positions shown · non-contrast
Comparison: 08/21/2009

CLINICAL DATA: Shortness of breath, back pain, weakness, cough,
history hypertension, smoking, carcinoma of the colon

CHEST - 2 VIEW

[w chest pa]
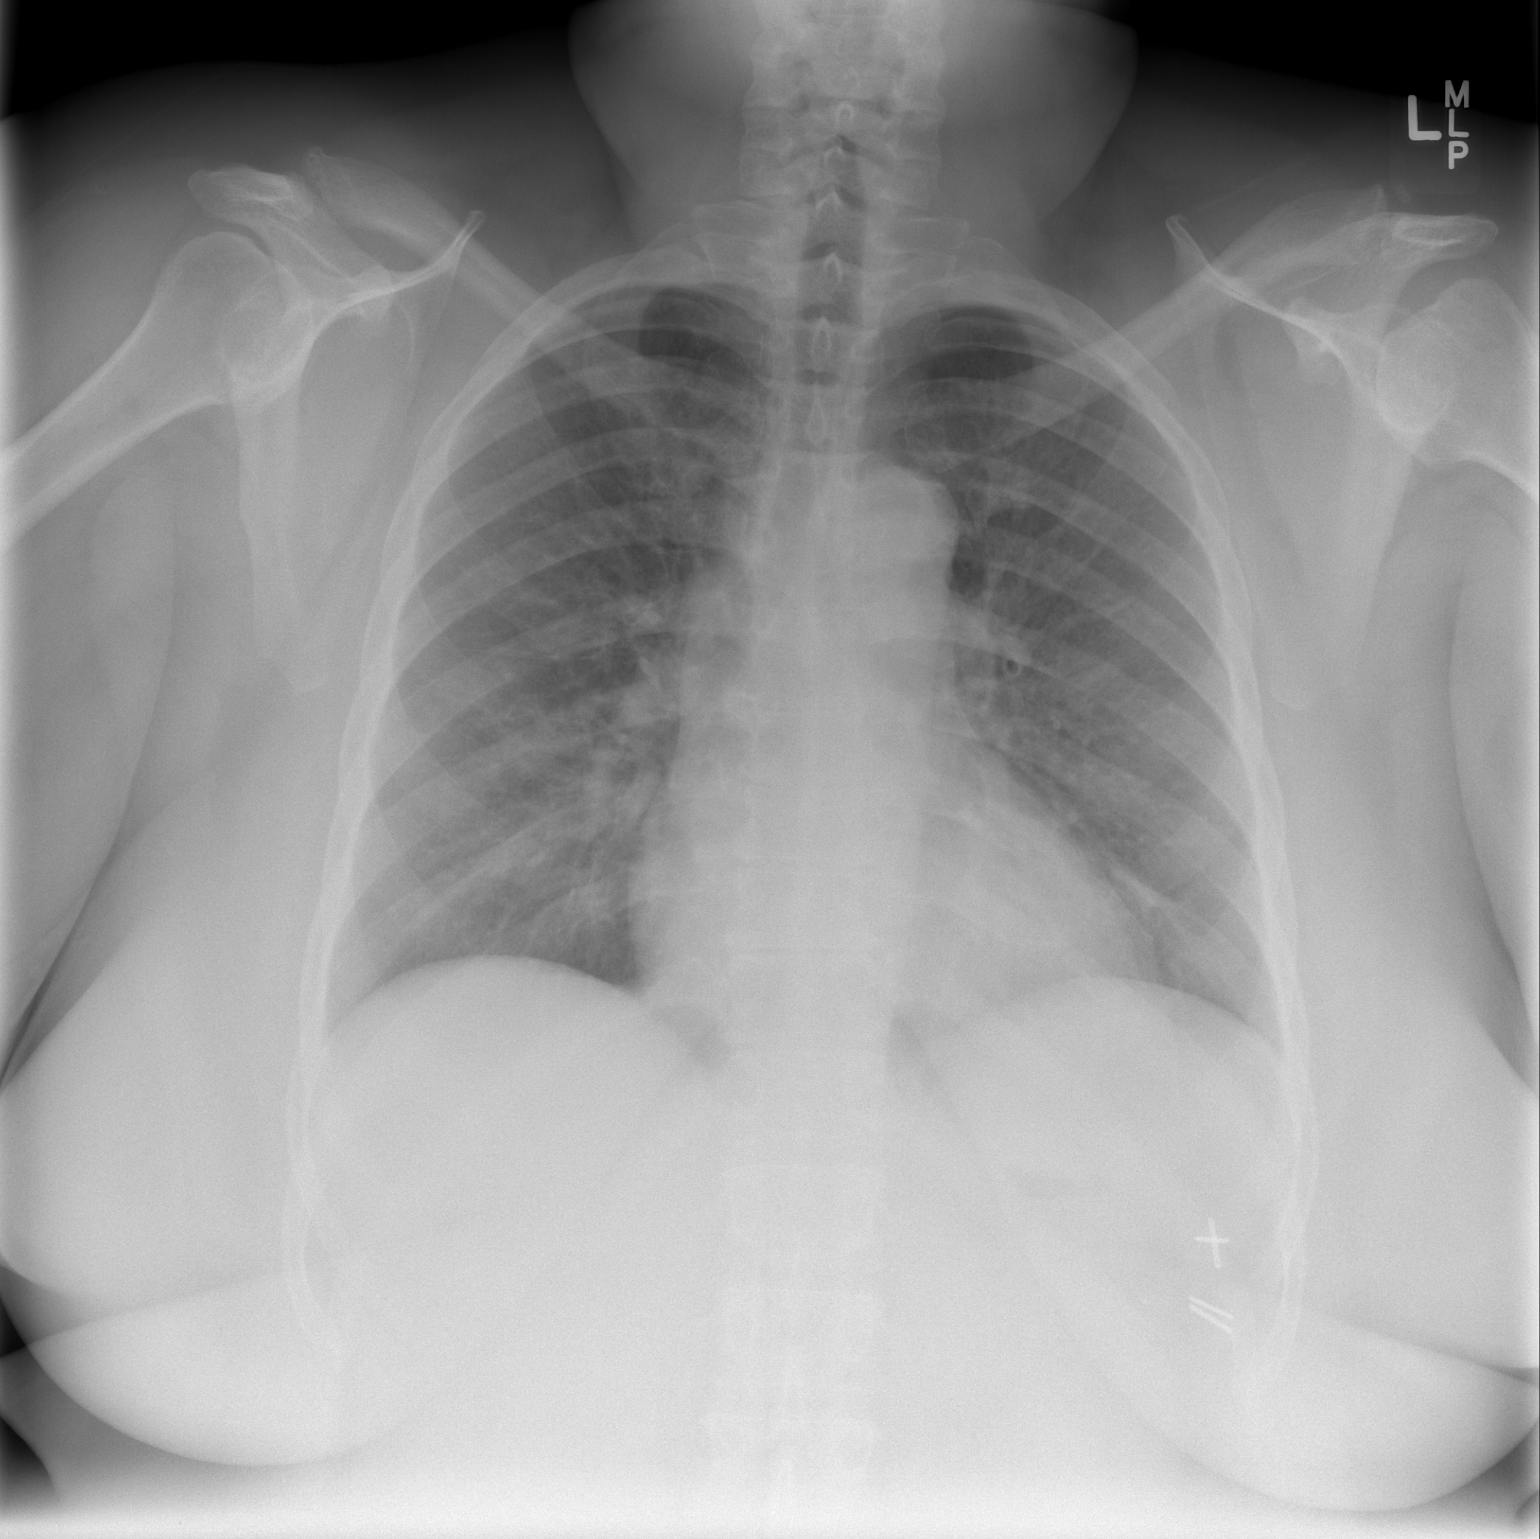

[w chest lat]
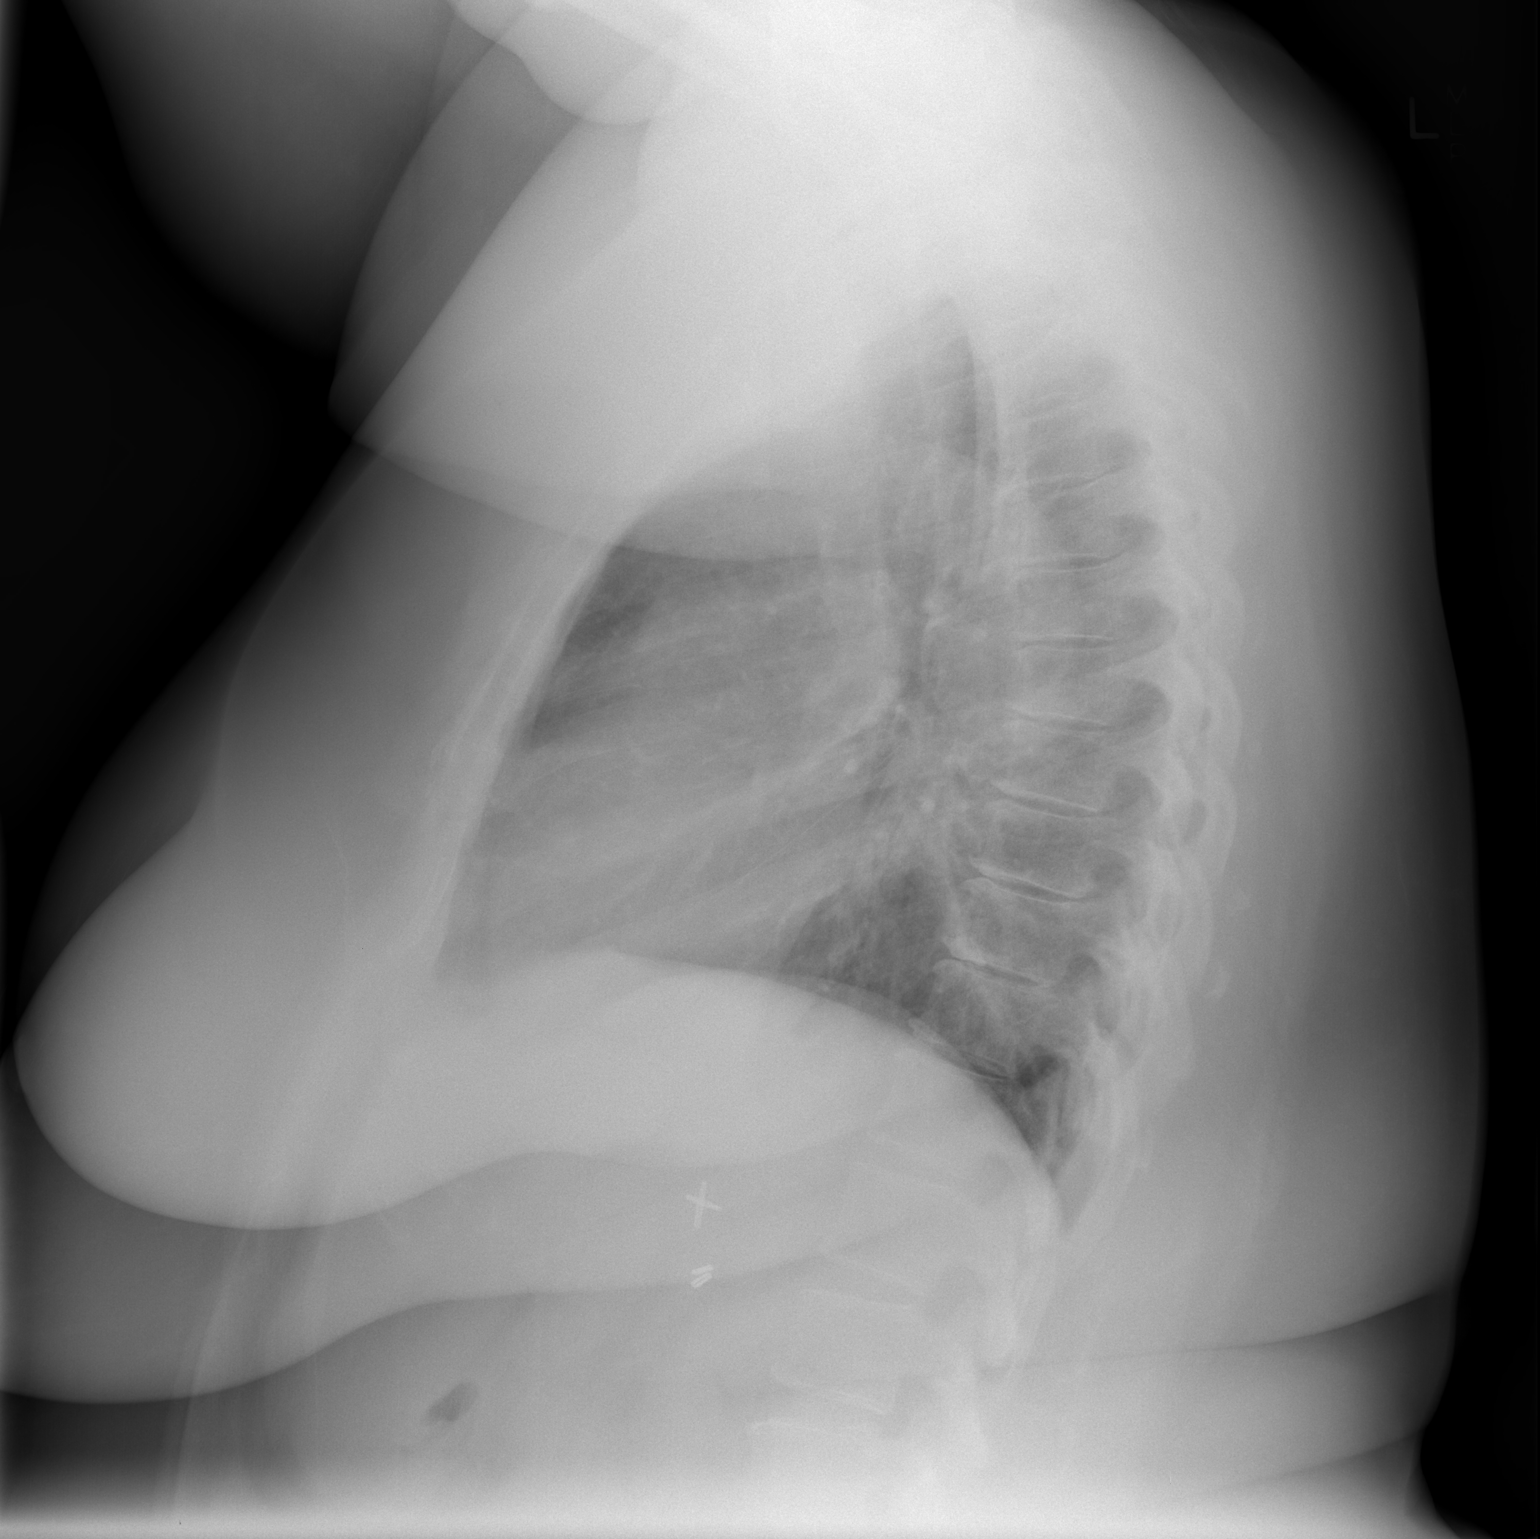

[2 of 2 positions shown; findings below may reference images not displayed]

FINDINGS: Mild enlargement of cardiac silhouette.
Slight pulmonary vascular congestion.
Chronic peribronchial thickening and chronic accentuation of
perihilar markings stable.
No gross acute infiltrate or effusion.
Bones appear demineralized.
Surgical clips left upper quadrant.
IMPRESSION: Chronic mild enlargement of cardiac silhouette with slight
pulmonary vascular congestion.
Chronic bronchitic changes.
No acute abnormalities.

## 2011-10-24 MED ORDER — OXYCODONE-ACETAMINOPHEN 10-325 MG PO TABS: 1.0000 | ORAL_TABLET | Freq: Three times a day (TID) | ORAL | Status: DC | PRN

## 2011-10-24 MED ORDER — PREDNISONE 20 MG PO TABS: 60.0000 mg | ORAL_TABLET | Freq: Every day | ORAL | Status: AC

## 2011-10-24 MED ORDER — ACETAMINOPHEN 325 MG PO TABS: 650.0000 mg | ORAL_TABLET | Freq: Four times a day (QID) | ORAL | Status: DC | PRN

## 2011-10-24 MED ORDER — HYDROCOD POLST-CHLORPHEN POLST 10-8 MG/5ML PO LQCR: 5.0000 mL | Freq: Two times a day (BID) | ORAL | Status: DC | PRN

## 2011-10-24 NOTE — Progress Notes (Signed)
  Echocardiogram 2D Echocardiogram has been performed.  Jorje Guild [facility] 10/24/2011, 9:35 AM

## 2011-10-24 NOTE — Plan of Care (Signed)
Problem: Discharge Progression Outcomes Goal: Pain controlled with appropriate interventions Outcome: Adequate for Discharge Has chronic pain   Problem: Discharge Progression Outcomes Goal: If EF < 40% ACEI/ARB addressed at discharge Outcome: Not Applicable Date Met:  10/24/11 EF 60-65% Goal: Pain controlled with appropriate interventions Outcome: Adequate for Discharge Chronic pain

## 2011-10-24 NOTE — Progress Notes (Signed)
Pt 02 sats on room air when asleep were 92%. Pt resting comfortably, on back with head of bed slightly elevated.  MCCLAIN, Brooke [name] 10/24/2011

## 2011-10-24 NOTE — Discharge Summary (Signed)
Brooke [name] MRN: [id number] DOB/AGE: [birth year] 56 y.o.  Admit date: 10/21/2011 Discharge date: 10/24/2011  Primary Care Physician:  Quitman Livings, MD, MD   Discharge Diagnoses:   Patient Active Problem List  Diagnoses  . 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 Morbid obesity  . Hypertension  . Dyslipidemia  . PVC (premature ventricular contraction)  . Esophageal reflux  . Diarrhea following gastrointestinal surgery  . Asthma exacerbation in COPD  . Hypokalemia  . Tobacco abuse  . Arthritis  . Anxiety  . Depression    DISCHARGE MEDICATION: Medication List  As of 10/24/2011  7:07 PM   STOP taking these medications         ibuprofen 800 MG tablet         TAKE these medications         acetaminophen 325 MG tablet   Commonly known as: TYLENOL   Take 2 tablets (650 mg total) by mouth every 6 (six) hours as needed (or Fever >/= 101).      albuterol 108 (90 BASE) MCG/ACT inhaler   Commonly known as: PROVENTIL HFA;VENTOLIN HFA   Inhale 2 puffs into the lungs every 6 (six) hours as needed. For shortness of breeath      ALPRAZolam 0.25 MG tablet   Commonly known as: XANAX   Take 0.25 mg by mouth 2 (two) times daily. anxiety      chlorpheniramine-HYDROcodone 10-8 MG/5ML Lqcr   Commonly known as: TUSSIONEX   Take 5 mLs by mouth every 12 (twelve) hours as needed.      clonazePAM 0.5 MG tablet   Commonly known as: KLONOPIN   Take 1 tablet by mouth Daily.      FLUoxetine 20 MG capsule   Commonly known as: PROZAC   Take 1 tablet by mouth Daily.      hydrochlorothiazide 25 MG tablet   Commonly known as: HYDRODIURIL   Take 25 mg by mouth daily.      indomethacin 25 MG capsule   Commonly known as: INDOCIN   Take 1 tablet by mouth Daily.      mometasone 50 MCG/ACT nasal spray   Commonly known as: NASONEX   Place 2 sprays into the nose daily.      omeprazole 40 MG capsule   Commonly known as: PRILOSEC   Take 40 mg by  mouth daily.      oxyCODONE-acetaminophen 10-325 MG per tablet   Commonly known as: PERCOCET   Take 1 tablet by mouth every 8 (eight) hours as needed. For pain      predniSONE 20 MG tablet   Commonly known as: DELTASONE   Take 3 tablets (60 mg total) by mouth daily with breakfast.      traZODone 50 MG tablet   Commonly known as: DESYREL   Take 1 tablet by mouth Daily.              Consults:     SIGNIFICANT DIAGNOSTIC STUDIES:  Dg Chest 2 View  10/21/2011  *RADIOLOGY REPORT*  Clinical Data: Left shoulder pain.  Short of breath.  CHEST - 2 VIEW  Comparison: 10/15/2011  Findings: Borderline cardiomegaly.  Bronchitic changes.  No consolidation or mass.  No pleural effusion or pneumothorax.  IMPRESSION: Borderline cardiomegaly and bronchitic changes.  Original Report Authenticated By: Donavan Burnet, M.D.   Dg Chest 2 View  10/15/2011  *RADIOLOGY REPORT*  Clinical Data: Chest and back pain, shortness of breath, hypertension  CHEST -  2 VIEW  Comparison: 07/28/2011  Findings:  Vascular clips in the upper abdomen.  Mild bilateral diffuse interstitial prominence.  No focal airspace consolidation. No overt interstitial edema.  No effusion.  Borderline cardiomegaly.  IMPRESSION: 1.  Stable mild diffuse interstitial prominence, probably chronic. 2.  Borderline cardiomegaly.  Original Report Authenticated By: Osa Craver, M.D.     ECHO: Completed. Results pending       No results found for this or any previous visit (from the past 240 hour(s)).  BRIEF ADMITTING H & P: 56 year old female with a past medical history significant for hypertension, history of asthma/COPD, tobacco abuse, anxiety/depression, dyslipidemia, morbidity obesity and remote history of colon cancer status post resection; who came into the hospital secondary to shortness of breath, nonproductive cough and increased wheezing. Patient reports that her has been present for the last 5 months approximately and is  slowly worsening. She endorses that about week ago she was seen in the emergency department where she received nebulizer treatment and a prescription for prednisone, with improvement of her symptoms and subsequently discharged home. After finishing taking medications she reports that her symptoms very much return but this time having difficulty putting catch her breath. In the emergency department patient was seen and despite 2 long nebulizer treatment and a shot of Solu Medrol she continued to be tachypneic and with diffuse wheezing. [facility] hospitalist was called to admit patient for further regimen treatment. Of note patient still smoking.  Patient denied any chest pain, denies any headache, abdominal pain, nausea/vomiting, melena, hematochezia, diarrhea or any other acute complaints.       Hospital Course:  Present on Admission:  .Asthma exacerbation in COPD: Patient was treated initially with IV steroids, beta agonists antibiotics. The patient was transitioned from IV steroids to by mouth steroids and completed her course of antibiotics. The patient was counseled against any further smoking however still continues to smoke although she is trying to cut down. The patient had a sleep study which she claimed showed that she needed CPAP. The results of the study were obtained however there were no recommendations for CPAP. An overnight pulse oximetry was ordered was not completed. However during the night the patient's O2 sats were noted to be 92% on room and while sleeping. The patient complained of difficulty lying down while sleeping. A 2-D echocardiogram was ordered and completed however the patient refused to wait for the results before leaving and this was discharge without results in place. I will ask her primary care physician to followup on results of the echocardiogram and adjust therapeutic plan accordingly.  .Tobacco abuse: Patient is trying to cut down on her cigarette use however still  actively smoking. The patient was counseled against further use and given options for 8 stenosis in smoking cessation.   . chronic pain: The patient is on chronic narcotics for her pain. She states that she had been the patient is a pain clinic however did not like the atmosphere the clinic so has not returned.   .Hypertension: Blood pressures well-controlled during this hospitalization   .Hypokalemia: Potassium was repleted orally.   .Anxiety: Patient is continued on Klonopin and Xanax.   .Depression: Patient is continued on Prozac.  Disposition and Follow-up:  Patient has been instructed to followup with Dr. Roseanne Reno in one week.  Discharge Orders    Future Orders Please Complete By Expires   Diet - low sodium heart healthy      Activity as tolerated - No restrictions  DISCHARGE EXAM:  General: Alert, awake, oriented x3, in no acute distress. Resting comfortably with no increased work of breathing  Vital Signs:Blood pressure 104/57, pulse 81, temperature 97.4 F (36.3 C), temperature source Oral, resp. rate 20, height 4\' 8"  (1.422 m), weight 113.9 kg (251 lb 1.7 oz), last menstrual period 05/24/2003, SpO2 97.00%. HEENT: [place]/AT PEERL, EOMI  Neck: Trachea midline, no masses, no thyromegal,y no JVD, no carotid bruit  OROPHARYNX: Moist, No exudate/ erythema/lesions.  Heart: Regular rate and rhythm, without murmurs, rubs, gallops, PMI non-displaced, no heaves or thrills on palpation.  Lungs: CTA. No wheezing. Abdomen: Soft, nontender, nondistended, positive bowel sounds, no masses no hepatosplenomegaly noted..  Neuro: No focal neurological deficits noted cranial nerves II through XII grossly intact. DTRs 2+ bilaterally upper and lower extremities. Strength functional in bilateral upper and lower extremities.    Basename 10/23/11 0513 10/22/11 0553  NA 138 135  K 4.2 4.5  CL 100 102  CO2 30 25  GLUCOSE 166* 148*  BUN 12 9  CREATININE 0.62 0.58  CALCIUM 9.4 8.2*  MG -- --    PHOS -- --   No results found for this basename: AST:2,ALT:2,ALKPHOS:2,BILITOT:2,PROT:2,ALBUMIN:2 in the last 72 hours No results found for this basename: LIPASE:2,AMYLASE:2 in the last 72 hours  Basename 10/22/11 0553  WBC 11.7*  NEUTROABS --  HGB 12.7  HCT 38.5  MCV 99.5  PLT 198   Total time for discharge process including face-to-face time approximately 47 minutes Signed: Shelbey Spindler A. 10/24/2011, 7:07 PM

## 2011-10-28 DIAGNOSIS — R5381 Other malaise: Secondary | ICD-10-CM | POA: Diagnosis not present

## 2011-10-28 DIAGNOSIS — I10 Essential (primary) hypertension: Secondary | ICD-10-CM | POA: Diagnosis not present

## 2011-10-28 DIAGNOSIS — R1013 Epigastric pain: Secondary | ICD-10-CM | POA: Diagnosis not present

## 2011-11-26 ENCOUNTER — Ambulatory Visit (HOSPITAL_BASED_OUTPATIENT_CLINIC_OR_DEPARTMENT_OTHER): Payer: Medicare Other | Attending: Internal Medicine

## 2011-11-26 VITALS — Ht <= 58 in | Wt 240.0 lb

## 2011-11-26 DIAGNOSIS — G4733 Obstructive sleep apnea (adult) (pediatric): Secondary | ICD-10-CM | POA: Insufficient documentation

## 2011-11-26 DIAGNOSIS — R0609 Other forms of dyspnea: Secondary | ICD-10-CM | POA: Diagnosis not present

## 2011-11-26 DIAGNOSIS — R0989 Other specified symptoms and signs involving the circulatory and respiratory systems: Secondary | ICD-10-CM | POA: Insufficient documentation

## 2011-11-28 ENCOUNTER — Emergency Department (HOSPITAL_COMMUNITY): Payer: Medicare Other

## 2011-11-28 ENCOUNTER — Encounter (HOSPITAL_COMMUNITY): Payer: Self-pay | Admitting: *Deleted

## 2011-11-28 ENCOUNTER — Emergency Department (HOSPITAL_COMMUNITY)
Admission: EM | Admit: 2011-11-28 | Discharge: 2011-11-28 | Disposition: A | Discharge status: Home / Self Care | Payer: Medicare Other | Attending: Emergency Medicine | Admitting: Emergency Medicine

## 2011-11-28 DIAGNOSIS — G8929 Other chronic pain: Secondary | ICD-10-CM | POA: Insufficient documentation

## 2011-11-28 DIAGNOSIS — I10 Essential (primary) hypertension: Secondary | ICD-10-CM | POA: Diagnosis not present

## 2011-11-28 DIAGNOSIS — M25559 Pain in unspecified hip: Secondary | ICD-10-CM | POA: Insufficient documentation

## 2011-11-28 DIAGNOSIS — M129 Arthropathy, unspecified: Secondary | ICD-10-CM | POA: Diagnosis not present

## 2011-11-28 DIAGNOSIS — K219 Gastro-esophageal reflux disease without esophagitis: Secondary | ICD-10-CM | POA: Diagnosis not present

## 2011-11-28 DIAGNOSIS — F17200 Nicotine dependence, unspecified, uncomplicated: Secondary | ICD-10-CM | POA: Insufficient documentation

## 2011-11-28 DIAGNOSIS — M5430 Sciatica, unspecified side: Secondary | ICD-10-CM | POA: Diagnosis not present

## 2011-11-28 DIAGNOSIS — Z79899 Other long term (current) drug therapy: Secondary | ICD-10-CM | POA: Insufficient documentation

## 2011-11-28 DIAGNOSIS — J449 Chronic obstructive pulmonary disease, unspecified: Secondary | ICD-10-CM | POA: Insufficient documentation

## 2011-11-28 DIAGNOSIS — M25519 Pain in unspecified shoulder: Secondary | ICD-10-CM | POA: Diagnosis not present

## 2011-11-28 DIAGNOSIS — E785 Hyperlipidemia, unspecified: Secondary | ICD-10-CM | POA: Insufficient documentation

## 2011-11-28 DIAGNOSIS — G4733 Obstructive sleep apnea (adult) (pediatric): Secondary | ICD-10-CM | POA: Insufficient documentation

## 2011-11-28 DIAGNOSIS — Z85038 Personal history of other malignant neoplasm of large intestine: Secondary | ICD-10-CM | POA: Diagnosis not present

## 2011-11-28 DIAGNOSIS — J4489 Other specified chronic obstructive pulmonary disease: Secondary | ICD-10-CM | POA: Insufficient documentation

## 2011-11-28 DIAGNOSIS — W010XXA Fall on same level from slipping, tripping and stumbling without subsequent striking against object, initial encounter: Secondary | ICD-10-CM | POA: Insufficient documentation

## 2011-11-28 MED ORDER — OXYCODONE-ACETAMINOPHEN 5-325 MG PO TABS: 1.0000 | ORAL_TABLET | Freq: Four times a day (QID) | ORAL | Status: AC | PRN

## 2011-11-28 MED ORDER — METHOCARBAMOL 500 MG PO TABS
500.0000 mg | ORAL_TABLET | Freq: Once | ORAL | Status: AC
  Administered 2011-11-28: 500 mg via ORAL
  Filled 2011-11-28: qty 1

## 2011-11-28 MED ORDER — HYDROMORPHONE HCL PF 2 MG/ML IJ SOLN
1.0000 mg | Freq: Once | INTRAMUSCULAR | Status: AC
  Administered 2011-11-28: 20:00:00 via INTRAMUSCULAR
  Filled 2011-11-28: qty 1

## 2011-11-28 MED ORDER — DEXAMETHASONE SODIUM PHOSPHATE 10 MG/ML IJ SOLN
10.0000 mg | Freq: Once | INTRAMUSCULAR | Status: AC
  Administered 2011-11-28: 10 mg via INTRAMUSCULAR
  Filled 2011-11-28: qty 1

## 2011-11-28 MED ORDER — KETOROLAC TROMETHAMINE 60 MG/2ML IM SOLN
60.0000 mg | Freq: Once | INTRAMUSCULAR | Status: AC
  Administered 2011-11-28: 60 mg via INTRAMUSCULAR
  Filled 2011-11-28: qty 2

## 2011-11-28 NOTE — ED Provider Notes (Signed)
History     CSN: [id number]  Arrival date & time 11/28/11  1715   First MD Initiated Contact with Patient 11/28/11 1840      7:08 PM HPI Patient reports approximately 2 days ago she tripped and fell over her steps stool. Reports she hit her left hip on the stool. Reports initially had no pain but today woke up with severe left hip pain. Reports pain radiates down entire left lower extremity. Reports pain is worse with laying flat and certain positions. States pain is associated with mild left-sided back pain. Denies saddle anesthesias, or perineal numbness, incontinence, abdominal pain, urinary symptoms. Denies history of similar symptoms. Reports also having chronic shoulder pain that she reports is typical for her arthritis Patient is a 56 y.o. female presenting with hip pain. The history is provided by the patient.  Hip Pain This is a new problem. The current episode started today. The problem occurs constantly. The problem has been unchanged. Pertinent negatives include no abdominal pain, chest pain, chills, fever, headaches, joint swelling, neck pain, numbness, urinary symptoms, vomiting or weakness. The symptoms are aggravated by walking, standing and bending. She has tried nothing for the symptoms. The treatment provided no relief.    Past Medical History  Diagnosis Date  . Hypertension   . Asthma   . COPD (chronic obstructive pulmonary disease)   . Anxiety   . Cancer   . Colon cancer   . Dyslipidemia   . Migraine headache   . Chronic bronchitis   . Uterine fibroid   . Ovarian cyst   . GERD (gastroesophageal reflux disease)   . Morbid obesity   . Obstructive sleep apnea     mild  . Chronic pain   . Chronic back pain   . Arthritis   . Osteoarthritis   . Depression     Past Surgical History  Procedure Date  . Knee arthroscopy     bil.  . Carpal tunnel release     rt  . Mouth surgery     teeth extraction  . Cesarean section   . Subtotal colectomy   . Colonoscopy     . Tubal ligation     Family History  Problem Relation Age of Onset  . Uterine cancer Mother   . Stroke Father   . Colon cancer Maternal Uncle   . Diabetes Father   . Ovarian cancer Mother   . Hypertension Father   . Breast cancer Maternal Grandmother   . Diabetes Sister   . Asthma Child   . Asthma Child     History  Substance Use Topics  . Smoking status: Current Some Day Smoker -- 1.0 packs/day for 30 years    Types: Cigarettes  . Smokeless tobacco: Never Used   Comment: tobacco info given 07/21/2011  . Alcohol Use: No    OB History    Grav Para Term Preterm Abortions TAB SAB Ect Mult Living   2 2 2       2       Review of Systems  Constitutional: Negative for fever and chills.  HENT: Negative for neck pain and neck stiffness.   Cardiovascular: Negative for chest pain.  Gastrointestinal: Negative for vomiting and abdominal pain.  Genitourinary: Negative for dysuria, urgency, frequency, hematuria, flank pain and pelvic pain.  Musculoskeletal: Positive for back pain. Negative for joint swelling.       Hip pain. Shoulder pain. Denies saddle anesthesias, or perineal numbness, urinary or bowel incontinence  Neurological:  Negative for weakness, numbness and headaches.  All other systems reviewed and are negative.    Allergies  Aspirin and Kiwi extract  Home Medications   Current Outpatient Rx  Name Route Sig Dispense Refill  . ALBUTEROL SULFATE HFA 108 (90 BASE) MCG/ACT IN AERS Inhalation Inhale 2 puffs into the lungs every 6 (six) hours as needed. For shortness of breeath    . ALPRAZOLAM 0.25 MG PO TABS Oral Take 0.25 mg by mouth 2 (two) times daily. anxiety     . CLONAZEPAM 0.5 MG PO TABS Oral Take 1 tablet by mouth Daily.    Marland Kitchen FLUOXETINE HCL 20 MG PO CAPS Oral Take 1 tablet by mouth Daily.    Marland Kitchen HYDROCHLOROTHIAZIDE 25 MG PO TABS Oral Take 25 mg by mouth daily.      . INDOMETHACIN 25 MG PO CAPS Oral Take 1 tablet by mouth Daily.    . MOMETASONE FUROATE 50 MCG/ACT  NA SUSP Nasal Place 2 sprays into the nose daily.    Marland Kitchen OMEPRAZOLE 40 MG PO CPDR Oral Take 40 mg by mouth daily.      . OXYCODONE-ACETAMINOPHEN 10-325 MG PO TABS Oral Take 1 tablet by mouth every 8 (eight) hours as needed. Pain.    . TRAZODONE HCL 50 MG PO TABS Oral Take 1 tablet by mouth Daily.      BP 123/77  Pulse 62  Temp 98.5 F (36.9 C) (Oral)  Resp 20  SpO2 95%  LMP 05/24/2003  Physical Exam  Vitals reviewed. Constitutional: She is oriented to person, place, and time. Vital signs are normal. She appears well-developed and well-nourished. No distress.  HENT:  Head: Normocephalic and atraumatic.  Eyes: Pupils are equal, round, and reactive to light.  Neck: Neck supple.  Pulmonary/Chest: Effort normal.  Musculoskeletal:       Left shoulder: She exhibits tenderness and pain. She exhibits normal range of motion, no swelling, normal pulse and normal strength.       Left hip: She exhibits decreased strength and tenderness. She exhibits normal range of motion and no swelling.       Arms:      Legs:      Patient reports pain with any sort of palpation or movement of any extremity or vomiting. According to facial expression patient likely has a positive straight-leg raise. Suspect mild sciatica on the left side  Neurological: She is alert and oriented to person, place, and time.  Skin: Skin is warm and dry. No rash noted. No erythema. No pallor.  Psychiatric: She has a normal mood and affect. Her behavior is normal.    ED Course  Procedures  Results for orders placed during the hospital encounter of 10/21/11  BASIC METABOLIC PANEL      Component Value Range   Sodium 138  135 - 145 mEq/L   Potassium 3.3 (*) 3.5 - 5.1 mEq/L   Chloride 99  96 - 112 mEq/L   CO2 30  19 - 32 mEq/L   Glucose, Bld 86  70 - 99 mg/dL   BUN 7  6 - 23 mg/dL   Creatinine, Ser 4.09  0.50 - 1.10 mg/dL   Calcium 8.7  8.4 - 81.1 mg/dL   GFR calc non Af Amer >90  >90 mL/min   GFR calc Af Amer >90  >90 mL/min   CBC      Component Value Range   WBC 11.7 (*) 4.0 - 10.5 K/uL   RBC 4.06  3.87 - 5.11  MIL/uL   Hemoglobin 13.3  12.0 - 15.0 g/dL   HCT 91.4  78.2 - 95.6 %   MCV 96.8  78.0 - 100.0 fL   MCH 32.8  26.0 - 34.0 pg   MCHC 33.8  30.0 - 36.0 g/dL   RDW 21.3  08.6 - 57.8 %   Platelets 179  150 - 400 K/uL  DIFFERENTIAL      Component Value Range   Neutrophils Relative 60  43 - 77 %   Neutro Abs 7.1  1.7 - 7.7 K/uL   Lymphocytes Relative 29  12 - 46 %   Lymphs Abs 3.4  0.7 - 4.0 K/uL   Monocytes Relative 9  3 - 12 %   Monocytes Absolute 1.1 (*) 0.1 - 1.0 K/uL   Eosinophils Relative 2  0 - 5 %   Eosinophils Absolute 0.2  0.0 - 0.7 K/uL   Basophils Relative 0  0 - 1 %   Basophils Absolute 0.0  0.0 - 0.1 K/uL  LIPID PANEL      Component Value Range   Cholesterol 161  0 - 200 mg/dL   Triglycerides 94  <469 mg/dL   HDL 77  >62 mg/dL   Total CHOL/HDL Ratio 2.1     VLDL 19  0 - 40 mg/dL   LDL Cholesterol 65  0 - 99 mg/dL  HEPATIC FUNCTION PANEL      Component Value Range   Total Protein 7.4  6.0 - 8.3 g/dL   Albumin 3.4 (*) 3.5 - 5.2 g/dL   AST 17  0 - 37 U/L   ALT 13  0 - 35 U/L   Alkaline Phosphatase 78  39 - 117 U/L   Total Bilirubin 0.3  0.3 - 1.2 mg/dL   Bilirubin, Direct <9.5  0.0 - 0.3 mg/dL   Indirect Bilirubin NOT CALCULATED  0.3 - 0.9 mg/dL  TSH      Component Value Range   TSH 1.584  0.350 - 4.500 uIU/mL  CBC      Component Value Range   WBC 11.7 (*) 4.0 - 10.5 K/uL   RBC 3.87  3.87 - 5.11 MIL/uL   Hemoglobin 12.7  12.0 - 15.0 g/dL   HCT 28.4  13.2 - 44.0 %   MCV 99.5  78.0 - 100.0 fL   MCH 32.8  26.0 - 34.0 pg   MCHC 33.0  30.0 - 36.0 g/dL   RDW 10.2  72.5 - 36.6 %   Platelets 198  150 - 400 K/uL  BASIC METABOLIC PANEL      Component Value Range   Sodium 135  135 - 145 mEq/L   Potassium 4.5  3.5 - 5.1 mEq/L   Chloride 102  96 - 112 mEq/L   CO2 25  19 - 32 mEq/L   Glucose, Bld 148 (*) 70 - 99 mg/dL   BUN 9  6 - 23 mg/dL   Creatinine, Ser 4.40  0.50 - 1.10  mg/dL   Calcium 8.2 (*) 8.4 - 10.5 mg/dL   GFR calc non Af Amer >90  >90 mL/min   GFR calc Af Amer >90  >90 mL/min  GLUCOSE, CAPILLARY      Component Value Range   Glucose-Capillary 108 (*) 70 - 99 mg/dL   Comment 1 Notify RN    BASIC METABOLIC PANEL      Component Value Range   Sodium 138  135 - 145 mEq/L   Potassium 4.2  3.5 - 5.1  mEq/L   Chloride 100  96 - 112 mEq/L   CO2 30  19 - 32 mEq/L   Glucose, Bld 166 (*) 70 - 99 mg/dL   BUN 12  6 - 23 mg/dL   Creatinine, Ser 1.61  0.50 - 1.10 mg/dL   Calcium 9.4  8.4 - 09.6 mg/dL   GFR calc non Af Amer >90  >90 mL/min   GFR calc Af Amer >90  >90 mL/min  GLUCOSE, CAPILLARY      Component Value Range   Glucose-Capillary 111 (*) 70 - 99 mg/dL   Comment 1 Notify RN    PRO B NATRIURETIC PEPTIDE      Component Value Range   Pro B Natriuretic peptide (BNP) 226.9 (*) 0 - 125 pg/mL  GLUCOSE, CAPILLARY      Component Value Range   Glucose-Capillary 105 (*) 70 - 99 mg/dL   Comment 1 Notify RN    GLUCOSE, CAPILLARY      Component Value Range   Glucose-Capillary 84  70 - 99 mg/dL   Comment 1 Notify RN     Dg Hip Complete Left  11/28/2011  *RADIOLOGY REPORT*  Clinical Data: Left hip pain following fall.  LEFT HIP - COMPLETE 2+ VIEW  Comparison: 09/29/2006 abdominal radiographs.  Findings: There is no evidence of fracture, subluxation or dislocation. Mild degenerative changes within both hips are again noted. No focal bony lesions are present. Mild degenerative changes in the lower lumbar spine are noted.  IMPRESSION: No evidence of acute bony abnormality.  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  Original Report Authenticated By: Rosendo Gros, M.D.      MDM   Advised patient that according to the controlled substance website she gets chronic pain meds filled monthly. Patient states however that she cannot have this filled the 28th. Advised patient I will provide her with enough medication until Monday but afterwards she would require a  refill from her regular doctor. Advised likely pain is sciatica. No acute findings on his x-ray. Advised patient that the emergency room is not typically fill chronic pain medication. Patient voices understanding and is ready for discharge  Thomasene Lot, Cordelia Poche 11/28/11 2018

## 2011-11-28 NOTE — Discharge Instructions (Signed)
Chronic Pain Chronic pain can be defined as pain that is lasting, off and on, and lasts for 3 to 6 months or longer. Many things cause chronic pain, which can make it difficult to make a discrete diagnosis. There are many treatment options available for chronic pain. However, finding a treatment that works well for you may require trying various approaches until a suitable one is found. CAUSES  In some types of chronic medical conditions, the pain is caused by a normal pain response within the body. A normal pain response helps the body identify illness or injury and prevent further damage from being done. In these cases, the cause of the pain may be identified and treated, even if it may not be cured completely. Examples of chronic conditions which can cause chronic pain include:  Inflammation of the joints (arthritis).   Back pain or neck pain (including bulging or herniated disks).   Migraine headaches.   Cancer.  In some other types of chronic pain syndromes, the pain is caused by an abnormal pain response within the body. An abnormal pain response is present when there is no ongoing cause (or stimulus) for the pain, or when the cause of the pain is arising from the nerves or nervous system itself. Examples of conditions which can cause chronic pain due to an abnormal pain response include:  Fibromyalgia.   Reflex sympathetic dystrophy (RSD).   Neuropathy (when the nerves themselves are damaged, and may cause pain).  DIAGNOSIS  Your caregiver will help diagnose your condition over time. In many cases, the initial focus will be on excluding conditions that could be causing the pain. Depending on your symptoms, your caregiver may order some tests to diagnose your condition. Some of these tests include:  Blood tests.   Computerized X-ray scans (CT scan).   Computerized magnetic scans (MRI).   X-rays.   Ultrasounds.   Nerve conduction studies.   Consultation with other physicians or  specialists.  TREATMENT  There are many treatment options for people suffering from chronic pain. Finding a treatment that works well may take time.   You may be referred to a pain management specialist.   You may be put on medication to help with the pain. Unfortunately, some medications (such as opiate medications) may not be very effective in cases where chronic pain is due to abnormal pain responses. Finding the right medications can take some time.   Adjunctive therapies may be used to provide additional relief and improve a patient's quality of life. These therapies include:   Mindfulness meditation.   Acupuncture.   Biofeedback.   Cognitive-behavioral therapy.   In certain cases, surgical interventions may be attempted.  HOME CARE INSTRUCTIONS   Make sure you understand these instructions prior to discharge.   Ask any questions and share any further concerns you have with your caregiver prior to discharge.   Take all medications as directed by your caregiver.   Keep all follow-up appointments.  SEEK MEDICAL CARE IF:   Your pain gets worse.   You develop a new pain that was not present before.   You cannot tolerate any medications prescribed by your caregiver.   You develop new symptoms since your last visit with your caregiver.  SEEK IMMEDIATE MEDICAL CARE IF:   You develop muscular weakness.   You have decreased sensation or numbness.   You lose control of bowel or bladder function.   Your pain suddenly gets much worse.   You have an oral   temperature above 102 F (38.9 C), not controlled by medication.   You develop shaking chills, confusion, chest pain, or shortness of breath.  Document Released: 02/15/2002 Document Revised: 05/15/2011 Document Reviewed: 05/24/2008 [facility] Patient Information 2012 [place], [place].Sciatica Sciatica is a weakness and/or changes in sensation (tingling, jolts, hot and cold, numbness) along the path the sciatic nerve travels.  Irritation or damage to lumbar nerve roots is often also referred to as lumbar radiculopathy.  Lumbar radiculopathy (Sciatica) is the most common form of this problem. Radiculopathy can occur in any of the nerves coming out of the spinal cord. The problems caused depend on which nerves are involved. The sciatic nerve is the large nerve supplying the branches of nerves going from the hip to the toes. It often causes a numbness or weakness in the skin and/or muscles that the sciatic nerve serves. It also may cause symptoms (problems) of pain, burning, tingling, or electric shock-like feelings in the path of this nerve. This usually comes from injury to the fibers that make up the sciatic nerve. Some of these symptoms are low back pain and/or unpleasant feelings in the following areas:  From the mid-buttock down the back of the leg to the back of the knee.   And/or the outside of the calf and top of the foot.   And/or behind the inner ankle to the sole of the foot.  CAUSES   Herniated or slipped disc. Discs are the little cushions between the bones in the back.   Pressure by the piriformis muscle in the buttock on the sciatic nerve (Piriformis Syndrome).   Misalignment of the bones in the lower back and buttocks (Sacroiliac Joint Derangement).   Narrowing of the spinal canal that puts pressure on or pinches the fibers that make up the sciatic nerve.   A slipped vertebra that is out of line with those above or beneath it.   Abnormality of the nervous system itself so that nerve fibers do not transmit signals properly, especially to feet and calves (neuropathy).   Tumor (this is rare).  Your caregiver can usually determine the cause of your sciatica and begin the treatment most likely to help you. TREATMENT  Taking over-the-counter painkillers, physical therapy, rest, exercise, spinal manipulation, and injections of anesthetics and/or steroids may be used. Surgery, acupuncture, and Yoga can also  be effective. Mind over matter techniques, mental imagery, and changing factors such as your bed, chair, desk height, posture, and activities are other treatments that may be helpful. You and your caregiver can help determine what is best for you. With proper diagnosis, the cause of most sciatica can be identified and removed. Communication and cooperation between your caregiver and you is essential. If you are not successful immediately, do not be discouraged. With time, a proper treatment can be found that will make you comfortable. HOME CARE INSTRUCTIONS   If the pain is coming from a problem in the back, applying ice to that area for 15 to 20 minutes, 3 to 4 times per day while awake, may be helpful. Put the ice in a plastic bag. Place a towel between the bag of ice and your skin.   You may exercise or perform your usual activities if these do not aggravate your pain, or as suggested by your caregiver.   Only take over-the-counter or prescription medicines for pain, discomfort, or fever as directed by your caregiver.   If your caregiver has given you a follow-up appointment, it is very important to keep  that appointment. Not keeping the appointment could result in a chronic or permanent injury, pain, and disability. If there is any problem keeping the appointment, you must call back to this facility for assistance.  SEEK IMMEDIATE MEDICAL CARE IF:   You experience loss of control of bowel or bladder.   You have increasing weakness in the trunk, buttocks, or legs.   There is numbness in any areas from the hip down to the toes.   You have difficulty walking or keeping your balance.   You have any of the above, with fever or forceful vomiting.  Document Released: 05/20/2001 Document Revised: 05/15/2011 Document Reviewed: 01/07/2008 [facility] Patient Information 2012 [place], [place].

## 2011-11-28 NOTE — ED Notes (Signed)
Pt c/o left hip pain, denies injury. Also c/o left arm pain states "its arthritis in my arm but i'm not sure about my hip." also c/o pain to left foot.

## 2011-11-29 NOTE — ED Provider Notes (Signed)
Medical screening examination/treatment/procedure(s) were performed by non-physician practitioner and as supervising physician I was immediately available for consultation/collaboration.   Rochele Lueck M Jarnell Cordaro, MD 11/29/11 0040 

## 2011-11-30 DIAGNOSIS — R0609 Other forms of dyspnea: Secondary | ICD-10-CM | POA: Diagnosis not present

## 2011-11-30 DIAGNOSIS — G4733 Obstructive sleep apnea (adult) (pediatric): Secondary | ICD-10-CM | POA: Diagnosis not present

## 2011-11-30 DIAGNOSIS — R0989 Other specified symptoms and signs involving the circulatory and respiratory systems: Secondary | ICD-10-CM | POA: Diagnosis not present

## 2011-11-30 NOTE — Procedures (Signed)
Brooke [name], Brooke [name]               ACCOUNT NO.:  [id number]  MEDICAL RECORD NO.:  [id number]          PATIENT TYPE:  OUT  LOCATION:  SLEEP CENTER                 FACILITY:  [facility]  PHYSICIAN:  Katy Brickell D. Maple Hudson, MD, FCCP, FACPDATE OF BIRTH:  12-26-1955  DATE OF STUDY:  11/26/2011                           NOCTURNAL POLYSOMNOGRAM  REFERRING PHYSICIAN:  Quitman Livings, MD  REFERRING PHYSICIAN:  Quitman Livings, MD  INDICATION FOR STUDY:  Hypersomnia with sleep apnea.  EPWORTH SLEEPINESS SCORE:  22/24.  BMI 53.1, weight 263 pounds, height 59 inches, neck 15 inches.  MEDICATIONS:  Home medications are charted and reviewed.  SLEEP ARCHITECTURE:  Total sleep time 336.5 minutes with sleep efficiency 84.8%.  Stage I 13.4%.  Stage II 86.6%.  Stage III and REM were absent.  Sleep latency 43 minutes.  Awake after sleep onset 17.5 minutes.  Arousal index 10.5.  Bedtime Medications:  Zolpidem, amitriptyline.  The sleep pattern was predominantly stage II with several brief spontaneous wakings.  The overall sleep pattern suggests medication effect.  RESPIRATORY DATA:  Apnea-hypopnea index (AHI) 7.8 per hour.  A total of 44 events was scored including 2 central apneas and 42 hypopneas. Events were not positional.  This is a diagnostic and PSG protocol and CPAP titration was not done.  OXYGEN DATA:  Moderately loud snoring with oxygen desaturation to a nadir of 88% and mean oxygen saturation through the study of 93.7% on room air.  CARDIAC DATA:  Frequent PVCs and PACs.  MOVEMENT-PARASOMNIA:  A total of 84 limb jerks were counted, of which 5 were associated with arousals or awakenings for periodic limb movement with arousal index of 0.9 per hour.  No bathroom trips.  IMPRESSIONS-RECOMMENDATIONS: 1. Sleep architecture is consistent with medication effect.  She had     taken zolpidem 10 mg and amitriptyline 25 mg at bedtime.  2. Mild obstructive sleep apnea/hypopnea syndrome, AHI 7.8 per  hour     with non-positional events.  Moderately loud snoring with oxygen     desaturation to a nadir of 88% and mean oxygen saturation through     the study of 93.7% on room air.  3. Scores in this range are usually addressed conservatively, starting     with weight loss and encouragement to sleep off flat of back.     Treatment for upper airway obstruction such as allergic rhinitis     may be helpful.  If conservative measures are insufficient, then a     trial of CPAP therapy may be justified.  She could return for     dedicated CPAP titration study if needed.  1. Frequent limb jerks were noted, but with insignificant effect on     sleep time.     Cassi Jenne D. Maple Hudson, MD, [facility], FACP Diplomate, American Board of Sleep Medicine    CDY/MEDQ  D:  11/30/2011 10:53:24  T:  11/30/2011 11:53:10  Job:  [id number]

## 2011-12-04 DIAGNOSIS — R1013 Epigastric pain: Secondary | ICD-10-CM | POA: Diagnosis not present

## 2011-12-04 DIAGNOSIS — I10 Essential (primary) hypertension: Secondary | ICD-10-CM | POA: Diagnosis not present

## 2011-12-04 DIAGNOSIS — F411 Generalized anxiety disorder: Secondary | ICD-10-CM | POA: Diagnosis not present

## 2011-12-04 DIAGNOSIS — E669 Obesity, unspecified: Secondary | ICD-10-CM | POA: Diagnosis not present

## 2011-12-26 DIAGNOSIS — IMO0002 Reserved for concepts with insufficient information to code with codable children: Secondary | ICD-10-CM | POA: Diagnosis not present

## 2011-12-26 DIAGNOSIS — M7500 Adhesive capsulitis of unspecified shoulder: Secondary | ICD-10-CM | POA: Diagnosis not present

## 2011-12-26 DIAGNOSIS — F17200 Nicotine dependence, unspecified, uncomplicated: Secondary | ICD-10-CM | POA: Diagnosis not present

## 2011-12-29 ENCOUNTER — Other Ambulatory Visit: Payer: Self-pay | Admitting: Physical Medicine and Rehabilitation

## 2011-12-29 DIAGNOSIS — M545 Low back pain: Secondary | ICD-10-CM

## 2011-12-29 DIAGNOSIS — IMO0002 Reserved for concepts with insufficient information to code with codable children: Secondary | ICD-10-CM

## 2012-01-01 DIAGNOSIS — G4730 Sleep apnea, unspecified: Secondary | ICD-10-CM | POA: Diagnosis not present

## 2012-01-01 DIAGNOSIS — E669 Obesity, unspecified: Secondary | ICD-10-CM | POA: Diagnosis not present

## 2012-01-01 DIAGNOSIS — I10 Essential (primary) hypertension: Secondary | ICD-10-CM | POA: Diagnosis not present

## 2012-01-01 DIAGNOSIS — R1013 Epigastric pain: Secondary | ICD-10-CM | POA: Diagnosis not present

## 2012-01-03 ENCOUNTER — Ambulatory Visit
Admission: RE | Admit: 2012-01-03 | Discharge: 2012-01-03 | Disposition: A | Discharge status: Home / Self Care | Payer: Medicare Other | Source: Ambulatory Visit | Attending: Physical Medicine and Rehabilitation | Admitting: Physical Medicine and Rehabilitation

## 2012-01-03 DIAGNOSIS — M47817 Spondylosis without myelopathy or radiculopathy, lumbosacral region: Secondary | ICD-10-CM | POA: Diagnosis not present

## 2012-01-03 DIAGNOSIS — M545 Low back pain: Secondary | ICD-10-CM

## 2012-01-03 DIAGNOSIS — IMO0002 Reserved for concepts with insufficient information to code with codable children: Secondary | ICD-10-CM

## 2012-01-03 DIAGNOSIS — M5126 Other intervertebral disc displacement, lumbar region: Secondary | ICD-10-CM | POA: Diagnosis not present

## 2012-01-03 DIAGNOSIS — M5137 Other intervertebral disc degeneration, lumbosacral region: Secondary | ICD-10-CM | POA: Diagnosis not present

## 2012-01-05 ENCOUNTER — Telehealth: Payer: Self-pay

## 2012-01-05 NOTE — Telephone Encounter (Signed)
lmomtcb x1--per TD since pt does not have pcp. She will need an appt with one of our sleep MD's to review the sleep study next available. If pt is not willing to do this then we can ask CDY if he will give pt results

## 2012-01-07 NOTE — Telephone Encounter (Signed)
Called, spoke with pt.  States her PCP at [facility] is wanting these results to see if she will need to be treated.  Advised she will need to ask them to call the sleep center at [phone] to obtain these results as we haven't seen her as pt.  She verbalized understanding and will ask PCP to call for these results.

## 2012-01-17 ENCOUNTER — Encounter (HOSPITAL_BASED_OUTPATIENT_CLINIC_OR_DEPARTMENT_OTHER): Payer: Self-pay | Admitting: Emergency Medicine

## 2012-01-17 ENCOUNTER — Emergency Department (HOSPITAL_BASED_OUTPATIENT_CLINIC_OR_DEPARTMENT_OTHER)
Admission: EM | Admit: 2012-01-17 | Discharge: 2012-01-17 | Disposition: A | Discharge status: Home / Self Care | Payer: Medicare Other | Attending: Emergency Medicine | Admitting: Emergency Medicine

## 2012-01-17 DIAGNOSIS — M48061 Spinal stenosis, lumbar region without neurogenic claudication: Secondary | ICD-10-CM | POA: Diagnosis not present

## 2012-01-17 DIAGNOSIS — IMO0002 Reserved for concepts with insufficient information to code with codable children: Secondary | ICD-10-CM | POA: Diagnosis not present

## 2012-01-17 DIAGNOSIS — J4489 Other specified chronic obstructive pulmonary disease: Secondary | ICD-10-CM | POA: Insufficient documentation

## 2012-01-17 DIAGNOSIS — F411 Generalized anxiety disorder: Secondary | ICD-10-CM | POA: Diagnosis not present

## 2012-01-17 DIAGNOSIS — F329 Major depressive disorder, single episode, unspecified: Secondary | ICD-10-CM | POA: Insufficient documentation

## 2012-01-17 DIAGNOSIS — F3289 Other specified depressive episodes: Secondary | ICD-10-CM | POA: Insufficient documentation

## 2012-01-17 DIAGNOSIS — M4800 Spinal stenosis, site unspecified: Secondary | ICD-10-CM

## 2012-01-17 DIAGNOSIS — J449 Chronic obstructive pulmonary disease, unspecified: Secondary | ICD-10-CM | POA: Insufficient documentation

## 2012-01-17 DIAGNOSIS — I10 Essential (primary) hypertension: Secondary | ICD-10-CM | POA: Diagnosis not present

## 2012-01-17 DIAGNOSIS — F17200 Nicotine dependence, unspecified, uncomplicated: Secondary | ICD-10-CM | POA: Insufficient documentation

## 2012-01-17 DIAGNOSIS — M5416 Radiculopathy, lumbar region: Secondary | ICD-10-CM

## 2012-01-17 DIAGNOSIS — Z85038 Personal history of other malignant neoplasm of large intestine: Secondary | ICD-10-CM | POA: Diagnosis not present

## 2012-01-17 DIAGNOSIS — E785 Hyperlipidemia, unspecified: Secondary | ICD-10-CM | POA: Insufficient documentation

## 2012-01-17 DIAGNOSIS — K219 Gastro-esophageal reflux disease without esophagitis: Secondary | ICD-10-CM | POA: Insufficient documentation

## 2012-01-17 MED ORDER — METHYLPREDNISOLONE SODIUM SUCC 125 MG IJ SOLR
125.0000 mg | Freq: Once | INTRAMUSCULAR | Status: AC
  Administered 2012-01-17: 125 mg via INTRAVENOUS
  Filled 2012-01-17: qty 2

## 2012-01-17 MED ORDER — DIAZEPAM 5 MG PO TABS: 5.0000 mg | ORAL_TABLET | Freq: Three times a day (TID) | ORAL | Status: AC | PRN

## 2012-01-17 MED ORDER — HYDROMORPHONE HCL PF 1 MG/ML IJ SOLN
1.0000 mg | Freq: Once | INTRAMUSCULAR | Status: AC
  Administered 2012-01-17: 1 mg via INTRAVENOUS
  Filled 2012-01-17: qty 1

## 2012-01-17 MED ORDER — PREDNISONE 10 MG PO TABS: 20.0000 mg | ORAL_TABLET | Freq: Every day | ORAL | Status: DC

## 2012-01-17 MED ORDER — METHOCARBAMOL 100 MG/ML IJ SOLN
1000.0000 mg | Freq: Once | INTRAMUSCULAR | Status: AC
  Administered 2012-01-17: 1000 mg via INTRAVENOUS
  Filled 2012-01-17: qty 10

## 2012-01-17 MED ORDER — DIAZEPAM 5 MG/ML IJ SOLN
5.0000 mg | Freq: Once | INTRAMUSCULAR | Status: AC
  Administered 2012-01-17: 5 mg via INTRAVENOUS
  Filled 2012-01-17: qty 2

## 2012-01-17 MED ORDER — HYDROMORPHONE HCL 4 MG PO TABS: 4.0000 mg | ORAL_TABLET | ORAL | Status: AC | PRN

## 2012-01-17 NOTE — ED Notes (Signed)
Left lower back pain radiating down leg.  No known injury.

## 2012-01-17 NOTE — ED Provider Notes (Signed)
History     CSN: [id number]  Arrival date & time 01/17/12  1342   First MD Initiated Contact with Patient 01/17/12 1356      Chief Complaint  Patient presents with  . Back Pain    (Consider location/radiation/quality/duration/timing/severity/associated sxs/prior treatment) HPI Comments: Pt presents with left back pain radiating down to her left foot. Pt states she has had this pain on and off for years but it has increased in the past 3 days. She has been followed by her PCP who ordered an MRI last week but she has not yet been given the results.  Pt reports the pain is sharp, 10/10, and is present at rest, worse with ambulation and palpation. Pt has taken Percocet 10s for pain but with no relief.  Pt is ambulatory with a cane. She states that she drags her left foot when she walks but this has be present for over 10 years. Pt denies abdominal pain, fever, trauma or increased activity, any heavy lifting, numbness or tingling of extremities, loss of bowel or bladder control, change in bowel movements, nausea, chest pain, shortness of breath.  Patient is a 56 y.o. female presenting with back pain. The history is provided by the patient.  Back Pain  Pertinent negatives include no chest pain, no fever, no numbness, no abdominal pain and no weakness.    Past Medical History  Diagnosis Date  . Hypertension   . Asthma   . COPD (chronic obstructive pulmonary disease)   . Anxiety   . Cancer   . Colon cancer   . Dyslipidemia   . Migraine headache   . Chronic bronchitis   . Uterine fibroid   . Ovarian cyst   . GERD (gastroesophageal reflux disease)   . Morbid obesity   . Obstructive sleep apnea     mild  . Chronic pain   . Chronic back pain   . Arthritis   . Osteoarthritis   . Depression     Past Surgical History  Procedure Date  . Knee arthroscopy     bil.  . Carpal tunnel release     rt  . Mouth surgery     teeth extraction  . Cesarean section   . Subtotal colectomy   .  Colonoscopy   . Tubal ligation     Family History  Problem Relation Age of Onset  . Uterine cancer Mother   . Stroke Father   . Colon cancer Maternal Uncle   . Diabetes Father   . Ovarian cancer Mother   . Hypertension Father   . Breast cancer Maternal Grandmother   . Diabetes Sister   . Asthma Child   . Asthma Child     History  Substance Use Topics  . Smoking status: Current Some Day Smoker -- 1.0 packs/day for 30 years    Types: Cigarettes  . Smokeless tobacco: Never Used   Comment: tobacco info given 07/21/2011  . Alcohol Use: No    OB History    Grav Para Term Preterm Abortions TAB SAB Ect Mult Living   2 2 2       2       Review of Systems  Constitutional: Negative for fever and chills.  Respiratory: Negative for shortness of breath.   Cardiovascular: Negative for chest pain.  Gastrointestinal: Negative for nausea, vomiting, abdominal pain, diarrhea and constipation.  Musculoskeletal: Positive for back pain.  Neurological: Negative for weakness and numbness.    Allergies  Aspirin and Kiwi extract  Home Medications   Current Outpatient Rx  Name Route Sig Dispense Refill  . ALBUTEROL SULFATE HFA 108 (90 BASE) MCG/ACT IN AERS Inhalation Inhale 2 puffs into the lungs every 6 (six) hours as needed. For shortness of breeath    . ALPRAZOLAM 0.25 MG PO TABS Oral Take 0.25 mg by mouth 2 (two) times daily. anxiety     . CLONAZEPAM 0.5 MG PO TABS Oral Take 1 tablet by mouth Daily.    Marland Kitchen FLUOXETINE HCL 20 MG PO CAPS Oral Take 1 tablet by mouth Daily.    Marland Kitchen HYDROCHLOROTHIAZIDE 25 MG PO TABS Oral Take 25 mg by mouth daily.      . INDOMETHACIN 25 MG PO CAPS Oral Take 1 tablet by mouth Daily.    . MOMETASONE FUROATE 50 MCG/ACT NA SUSP Nasal Place 2 sprays into the nose daily.    Marland Kitchen OMEPRAZOLE 40 MG PO CPDR Oral Take 40 mg by mouth daily.      . OXYCODONE-ACETAMINOPHEN 10-325 MG PO TABS Oral Take 1 tablet by mouth every 8 (eight) hours as needed. Pain.    . TRAZODONE HCL 50  MG PO TABS Oral Take 1 tablet by mouth Daily.      BP 130/82  Pulse 96  Temp 98.1 F (36.7 C) (Oral)  Resp 18  Wt 240 lb (108.863 kg)  SpO2 98%  LMP 05/24/2003  Physical Exam  Nursing note and vitals reviewed. Constitutional: She appears well-developed and well-nourished. No distress.  HENT:  Head: Normocephalic and atraumatic.  Neck: Neck supple.  Pulmonary/Chest: Effort normal.  Musculoskeletal:       Cervical back: She exhibits no bony tenderness.       Thoracic back: She exhibits no bony tenderness.       Lumbar back: She exhibits tenderness and bony tenderness. She exhibits no edema, no deformity, no laceration and no pain.       Lower extremities: on initial exam, strength in left leg is decreased, likely secondary to pain.  Sensation intact, distal pulses intact.    Neurological: She is alert.  Skin: She is not diaphoretic.    ED Course  Procedures (including critical care time)  Labs Reviewed - No data to display No results found.  4:28 PM Discussed patient with Dr Alto Denver.  Patient's left leg "weakness" is improving with pain medication.  Pt now able to lift left leg off stretcher against gravity.   MRI results from 7/27 reviewed.   5:32 PM Patient now sitting up in bed, reports great improvement.  She is able to move left leg all over and lift it freely.  Reports she also ambulated to the bathroom with minor assistance from nurse (her cane is in her car).  Discussed MRI results from 7/27.  Pt reports she is comfortable and ready for discharge.    1. Spinal stenosis   2. Lumbar radiculopathy       MDM  Pt with chronic low back pain with radiation down left leg, chronic weakness of left leg that is unchanged.  Pt presented due to increased pain.  Pt had MRI done 7/27 but had not yet been given the results.  Pt found to have spinal stenosis on MRI.  Pain relieved here and patient reexamined, pt able to move and use left leg easily once pain controlled.  Pt even  ambulated to the bathroom (with some assistance).  I do not believe patient needs reimaging today given her improvement with pain control.  Pt d/c home  with change in pain medication from Percocet 10s (pt is currently out) to dilaudid, also with valium, and prednisone.   Referral to Dr Phoebe Perch (neurosurg).  Discussed all results with patient.  Pt given return precautions.  Pt verbalizes understanding and agrees with plan.           [place], [place] 01/17/12 [phone]

## 2012-01-18 NOTE — ED Provider Notes (Signed)
Medical screening examination/treatment/procedure(s) were performed by non-physician practitioner and as supervising physician I was immediately available for consultation/collaboration.   Cyndra Numbers, MD 01/18/12 [phone]

## 2012-01-21 DIAGNOSIS — IMO0002 Reserved for concepts with insufficient information to code with codable children: Secondary | ICD-10-CM | POA: Diagnosis not present

## 2012-01-21 DIAGNOSIS — F17200 Nicotine dependence, unspecified, uncomplicated: Secondary | ICD-10-CM | POA: Diagnosis not present

## 2012-01-24 ENCOUNTER — Emergency Department (HOSPITAL_BASED_OUTPATIENT_CLINIC_OR_DEPARTMENT_OTHER): Payer: No Typology Code available for payment source

## 2012-01-24 ENCOUNTER — Emergency Department (HOSPITAL_BASED_OUTPATIENT_CLINIC_OR_DEPARTMENT_OTHER)
Admission: EM | Admit: 2012-01-24 | Discharge: 2012-01-24 | Disposition: A | Discharge status: Home / Self Care | Payer: No Typology Code available for payment source | Attending: Emergency Medicine | Admitting: Emergency Medicine

## 2012-01-24 ENCOUNTER — Encounter (HOSPITAL_BASED_OUTPATIENT_CLINIC_OR_DEPARTMENT_OTHER): Payer: Self-pay | Admitting: Emergency Medicine

## 2012-01-24 DIAGNOSIS — Z886 Allergy status to analgesic agent status: Secondary | ICD-10-CM | POA: Insufficient documentation

## 2012-01-24 DIAGNOSIS — K219 Gastro-esophageal reflux disease without esophagitis: Secondary | ICD-10-CM | POA: Insufficient documentation

## 2012-01-24 DIAGNOSIS — J4489 Other specified chronic obstructive pulmonary disease: Secondary | ICD-10-CM | POA: Insufficient documentation

## 2012-01-24 DIAGNOSIS — M549 Dorsalgia, unspecified: Secondary | ICD-10-CM | POA: Insufficient documentation

## 2012-01-24 DIAGNOSIS — Z85038 Personal history of other malignant neoplasm of large intestine: Secondary | ICD-10-CM | POA: Insufficient documentation

## 2012-01-24 DIAGNOSIS — J449 Chronic obstructive pulmonary disease, unspecified: Secondary | ICD-10-CM | POA: Insufficient documentation

## 2012-01-24 DIAGNOSIS — I10 Essential (primary) hypertension: Secondary | ICD-10-CM | POA: Insufficient documentation

## 2012-01-24 DIAGNOSIS — E785 Hyperlipidemia, unspecified: Secondary | ICD-10-CM | POA: Insufficient documentation

## 2012-01-24 DIAGNOSIS — M542 Cervicalgia: Secondary | ICD-10-CM | POA: Insufficient documentation

## 2012-01-24 DIAGNOSIS — Z79899 Other long term (current) drug therapy: Secondary | ICD-10-CM | POA: Insufficient documentation

## 2012-01-24 MED ORDER — OXYCODONE-ACETAMINOPHEN 5-325 MG PO TABS: 1.0000 | ORAL_TABLET | Freq: Four times a day (QID) | ORAL | Status: AC | PRN

## 2012-01-24 MED ORDER — DIAZEPAM 5 MG PO TABS
5.0000 mg | ORAL_TABLET | Freq: Once | ORAL | Status: AC
  Administered 2012-01-24: 5 mg via ORAL
  Filled 2012-01-24: qty 1

## 2012-01-24 MED ORDER — DIAZEPAM 5 MG PO TABS: 5.0000 mg | ORAL_TABLET | Freq: Two times a day (BID) | ORAL | Status: AC

## 2012-01-24 MED ORDER — HYDROCODONE-ACETAMINOPHEN 5-325 MG PO TABS
1.0000 | ORAL_TABLET | Freq: Once | ORAL | Status: AC
  Administered 2012-01-24: 1 via ORAL
  Filled 2012-01-24: qty 1
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
## 2012-01-25 NOTE — ED Provider Notes (Signed)
Medical screening examination/treatment/procedure(s) were performed by non-physician practitioner and as supervising physician I was immediately available for consultation/collaboration.   Hurman Horn, MD 01/25/12 [phone]

## 2012-01-29 DIAGNOSIS — I10 Essential (primary) hypertension: Secondary | ICD-10-CM | POA: Diagnosis not present

## 2012-01-29 DIAGNOSIS — M545 Low back pain: Secondary | ICD-10-CM | POA: Diagnosis not present

## 2012-01-29 DIAGNOSIS — J45909 Unspecified asthma, uncomplicated: Secondary | ICD-10-CM | POA: Diagnosis not present

## 2012-01-29 DIAGNOSIS — E669 Obesity, unspecified: Secondary | ICD-10-CM | POA: Diagnosis not present

## 2012-02-16 DIAGNOSIS — M48061 Spinal stenosis, lumbar region without neurogenic claudication: Secondary | ICD-10-CM | POA: Diagnosis not present

## 2012-02-26 ENCOUNTER — Encounter (HOSPITAL_COMMUNITY): Payer: Self-pay | Admitting: Emergency Medicine

## 2012-02-26 ENCOUNTER — Emergency Department (HOSPITAL_COMMUNITY)
Admission: EM | Admit: 2012-02-26 | Discharge: 2012-02-26 | Disposition: A | Discharge status: Home / Self Care | Payer: Medicare Other | Attending: Emergency Medicine | Admitting: Emergency Medicine

## 2012-02-26 DIAGNOSIS — F329 Major depressive disorder, single episode, unspecified: Secondary | ICD-10-CM | POA: Insufficient documentation

## 2012-02-26 DIAGNOSIS — F411 Generalized anxiety disorder: Secondary | ICD-10-CM | POA: Diagnosis not present

## 2012-02-26 DIAGNOSIS — G4733 Obstructive sleep apnea (adult) (pediatric): Secondary | ICD-10-CM | POA: Diagnosis not present

## 2012-02-26 DIAGNOSIS — F3289 Other specified depressive episodes: Secondary | ICD-10-CM | POA: Insufficient documentation

## 2012-02-26 DIAGNOSIS — E785 Hyperlipidemia, unspecified: Secondary | ICD-10-CM | POA: Insufficient documentation

## 2012-02-26 DIAGNOSIS — R102 Pelvic and perineal pain: Secondary | ICD-10-CM

## 2012-02-26 DIAGNOSIS — M545 Low back pain, unspecified: Secondary | ICD-10-CM | POA: Insufficient documentation

## 2012-02-26 DIAGNOSIS — E669 Obesity, unspecified: Secondary | ICD-10-CM | POA: Insufficient documentation

## 2012-02-26 DIAGNOSIS — J4489 Other specified chronic obstructive pulmonary disease: Secondary | ICD-10-CM | POA: Insufficient documentation

## 2012-02-26 DIAGNOSIS — Z79899 Other long term (current) drug therapy: Secondary | ICD-10-CM | POA: Diagnosis not present

## 2012-02-26 DIAGNOSIS — M129 Arthropathy, unspecified: Secondary | ICD-10-CM | POA: Insufficient documentation

## 2012-02-26 DIAGNOSIS — G8929 Other chronic pain: Secondary | ICD-10-CM | POA: Diagnosis not present

## 2012-02-26 DIAGNOSIS — I10 Essential (primary) hypertension: Secondary | ICD-10-CM | POA: Diagnosis not present

## 2012-02-26 DIAGNOSIS — M1990 Unspecified osteoarthritis, unspecified site: Secondary | ICD-10-CM | POA: Insufficient documentation

## 2012-02-26 DIAGNOSIS — N949 Unspecified condition associated with female genital organs and menstrual cycle: Secondary | ICD-10-CM | POA: Diagnosis not present

## 2012-02-26 DIAGNOSIS — Z85038 Personal history of other malignant neoplasm of large intestine: Secondary | ICD-10-CM | POA: Insufficient documentation

## 2012-02-26 DIAGNOSIS — J449 Chronic obstructive pulmonary disease, unspecified: Secondary | ICD-10-CM | POA: Diagnosis not present

## 2012-02-26 DIAGNOSIS — K219 Gastro-esophageal reflux disease without esophagitis: Secondary | ICD-10-CM | POA: Diagnosis not present

## 2012-02-26 MED ORDER — MORPHINE SULFATE 4 MG/ML IJ SOLN
4.0000 mg | Freq: Once | INTRAMUSCULAR | Status: AC
  Administered 2012-02-26: 4 mg via INTRAMUSCULAR
  Filled 2012-02-26: qty 1

## 2012-02-26 MED ORDER — LIDOCAINE 5 % EX OINT: TOPICAL_OINTMENT | CUTANEOUS | Status: DC | PRN

## 2012-02-26 MED ORDER — OXYCODONE-ACETAMINOPHEN 5-325 MG PO TABS: 1.0000 | ORAL_TABLET | ORAL | Status: DC | PRN

## 2012-02-26 NOTE — ED Notes (Signed)
Dr Yelverton at bedside.  

## 2012-02-26 NOTE — ED Notes (Signed)
Pt transported to ED by family.,Pt is requesting a surgeon right away to fix the pain in her low back. Pt able to sit and recline with minimal difficulty. Uses cane at home. Pt also c/o vaginal sores

## 2012-02-26 NOTE — ED Provider Notes (Signed)
History     CSN: [id number]  Arrival date & time 02/26/12  1537   First MD Initiated Contact with Patient 02/26/12 1725      Chief Complaint  Patient presents with  . Back Pain    chronic low back  . Vaginal Pain    sore in vaginal area    (Consider location/radiation/quality/duration/timing/severity/associated sxs/prior treatment) HPI Pt with chronic radicular lumbar pain radiating down Left leg presents today after running out of her percocet at home. She states that she followed up with orthopedist as prev suggested and no therapy was given. No new worrisome symptoms today. No incontinence, fever chills, weakness.   Pt also c/o of several weeks of vulvar pain being treated by PMD with lidocaine cream. She has run out. No vaginal d/c or bleeding.  Past Medical History  Diagnosis Date  . Hypertension   . Asthma   . COPD (chronic obstructive pulmonary disease)   . Anxiety   . Cancer   . Colon cancer   . Dyslipidemia   . Migraine headache   . Chronic bronchitis   . Uterine fibroid   . Ovarian cyst   . GERD (gastroesophageal reflux disease)   . Morbid obesity   . Obstructive sleep apnea     mild  . Chronic pain   . Chronic back pain   . Arthritis   . Osteoarthritis   . Depression     Past Surgical History  Procedure Date  . Knee arthroscopy     bil.  . Carpal tunnel release     rt  . Mouth surgery     teeth extraction  . Cesarean section   . Subtotal colectomy   . Colonoscopy   . Tubal ligation     Family History  Problem Relation Age of Onset  . Uterine cancer Mother   . Stroke Father   . Colon cancer Maternal Uncle   . Diabetes Father   . Ovarian cancer Mother   . Hypertension Father   . Breast cancer Maternal Grandmother   . Diabetes Sister   . Asthma Child   . Asthma Child     History  Substance Use Topics  . Smoking status: Current Some Day Smoker -- 1.0 packs/day for 30 years    Types: Cigarettes  . Smokeless tobacco: Never Used   Comment: tobacco info given 07/21/2011  . Alcohol Use: No    OB History    Grav Para Term Preterm Abortions TAB SAB Ect Mult Living   2 2 2       2       Review of Systems  Constitutional: Negative for fever and chills.  Gastrointestinal: Negative for nausea, vomiting and abdominal pain.  Genitourinary: Positive for genital sores. Negative for vaginal bleeding, vaginal discharge and vaginal pain.  Musculoskeletal: Positive for back pain.  Skin: Negative for rash and wound.  Neurological: Negative for dizziness, weakness, light-headedness and numbness.    Allergies  Aspirin and Kiwi extract  Home Medications   Current Outpatient Rx  Name Route Sig Dispense Refill  . ALBUTEROL SULFATE HFA 108 (90 BASE) MCG/ACT IN AERS Inhalation Inhale 2 puffs into the lungs every 6 (six) hours as needed. For shortness of breeath    . ALPRAZOLAM 0.25 MG PO TABS Oral Take 0.25 mg by mouth 2 (two) times daily. anxiety    . HYDROCHLOROTHIAZIDE 25 MG PO TABS Oral Take 25 mg by mouth daily.     . IBUPROFEN 200 MG PO TABS  Oral Take 400 mg by mouth every 8 (eight) hours as needed. For pain.    Marland Kitchen OMEPRAZOLE 40 MG PO CPDR Oral Take 40 mg by mouth daily.     . OXYCODONE-ACETAMINOPHEN 10-325 MG PO TABS Oral Take 1 tablet by mouth every 6 (six) hours as needed. For pain.    . TRAZODONE HCL 50 MG PO TABS Oral Take 50 mg by mouth daily.       BP 112/91  Pulse 90  Temp 98 F (36.7 C) (Oral)  Resp 18  SpO2 100%  LMP 05/24/2003  Physical Exam  Nursing note and vitals reviewed. Constitutional: She is oriented to person, place, and time. She appears well-developed and well-nourished. No distress.       Pt resting comfortably when I enter room, sleeping.   HENT:  Head: Normocephalic and atraumatic.  Mouth/Throat: Oropharynx is clear and moist.  Eyes: EOM are normal. Pupils are equal, round, and reactive to light.  Neck: Normal range of motion. Neck supple.  Cardiovascular: Normal rate and regular rhythm.    Pulmonary/Chest: Effort normal and breath sounds normal. No respiratory distress. She has no wheezes. She has no rales.  Abdominal: Soft. Bowel sounds are normal. She exhibits no mass. There is no tenderness. There is no rebound and no guarding.  Genitourinary:       Normal vulva, no lesions, no d/c  Musculoskeletal: Normal range of motion. She exhibits tenderness (Midline lumbar TTP. +L straight leg raise. Neurovasc intact). She exhibits no edema.  Neurological: She is alert and oriented to person, place, and time.       5/5 motor, sensation intact  Skin: Skin is warm and dry. No rash noted. No erythema.  Psychiatric: She has a normal mood and affect. Her behavior is normal.    ED Course  Procedures (including critical care time)  Labs Reviewed - No data to display No results found.   1. Chronic radicular lumbar pain   2. Vulvar pain       MDM  Pt advised to lose weight. I have given her a referral to [facility] for second opinion. Will give small dose of percocet and refill lido cream. Pt needs to f/u with PMD for chronic medical conditions. She has been given a list of symptoms for which she needs to return immediately.        Loren Racer, MD 02/26/12 1753

## 2012-03-05 DIAGNOSIS — E669 Obesity, unspecified: Secondary | ICD-10-CM | POA: Diagnosis not present

## 2012-03-05 DIAGNOSIS — M79609 Pain in unspecified limb: Secondary | ICD-10-CM | POA: Diagnosis not present

## 2012-03-05 DIAGNOSIS — M545 Low back pain: Secondary | ICD-10-CM | POA: Diagnosis not present

## 2012-04-02 DIAGNOSIS — M545 Low back pain: Secondary | ICD-10-CM | POA: Diagnosis not present

## 2012-04-02 DIAGNOSIS — J45909 Unspecified asthma, uncomplicated: Secondary | ICD-10-CM | POA: Diagnosis not present

## 2012-04-02 DIAGNOSIS — F411 Generalized anxiety disorder: Secondary | ICD-10-CM | POA: Diagnosis not present

## 2012-04-02 DIAGNOSIS — E669 Obesity, unspecified: Secondary | ICD-10-CM | POA: Diagnosis not present

## 2012-04-15 ENCOUNTER — Encounter (HOSPITAL_COMMUNITY): Payer: Self-pay | Admitting: Emergency Medicine

## 2012-04-15 ENCOUNTER — Emergency Department (HOSPITAL_COMMUNITY): Payer: Medicare Other

## 2012-04-15 ENCOUNTER — Emergency Department (HOSPITAL_COMMUNITY)
Admission: EM | Admit: 2012-04-15 | Discharge: 2012-04-15 | Disposition: A | Discharge status: Home / Self Care | Payer: Medicare Other | Attending: Emergency Medicine | Admitting: Emergency Medicine

## 2012-04-15 DIAGNOSIS — G43909 Migraine, unspecified, not intractable, without status migrainosus: Secondary | ICD-10-CM | POA: Insufficient documentation

## 2012-04-15 DIAGNOSIS — D259 Leiomyoma of uterus, unspecified: Secondary | ICD-10-CM | POA: Insufficient documentation

## 2012-04-15 DIAGNOSIS — G4733 Obstructive sleep apnea (adult) (pediatric): Secondary | ICD-10-CM | POA: Diagnosis not present

## 2012-04-15 DIAGNOSIS — J45901 Unspecified asthma with (acute) exacerbation: Secondary | ICD-10-CM | POA: Insufficient documentation

## 2012-04-15 DIAGNOSIS — Z79899 Other long term (current) drug therapy: Secondary | ICD-10-CM | POA: Insufficient documentation

## 2012-04-15 DIAGNOSIS — I10 Essential (primary) hypertension: Secondary | ICD-10-CM | POA: Diagnosis not present

## 2012-04-15 DIAGNOSIS — J40 Bronchitis, not specified as acute or chronic: Secondary | ICD-10-CM | POA: Diagnosis not present

## 2012-04-15 DIAGNOSIS — R0602 Shortness of breath: Secondary | ICD-10-CM | POA: Diagnosis not present

## 2012-04-15 DIAGNOSIS — K219 Gastro-esophageal reflux disease without esophagitis: Secondary | ICD-10-CM | POA: Insufficient documentation

## 2012-04-15 DIAGNOSIS — M129 Arthropathy, unspecified: Secondary | ICD-10-CM | POA: Diagnosis not present

## 2012-04-15 DIAGNOSIS — J441 Chronic obstructive pulmonary disease with (acute) exacerbation: Secondary | ICD-10-CM | POA: Diagnosis not present

## 2012-04-15 DIAGNOSIS — Z85038 Personal history of other malignant neoplasm of large intestine: Secondary | ICD-10-CM | POA: Diagnosis not present

## 2012-04-15 DIAGNOSIS — M549 Dorsalgia, unspecified: Secondary | ICD-10-CM | POA: Insufficient documentation

## 2012-04-15 DIAGNOSIS — E785 Hyperlipidemia, unspecified: Secondary | ICD-10-CM | POA: Diagnosis not present

## 2012-04-15 DIAGNOSIS — F17200 Nicotine dependence, unspecified, uncomplicated: Secondary | ICD-10-CM | POA: Insufficient documentation

## 2012-04-15 DIAGNOSIS — G8929 Other chronic pain: Secondary | ICD-10-CM | POA: Diagnosis not present

## 2012-04-15 DIAGNOSIS — M1990 Unspecified osteoarthritis, unspecified site: Secondary | ICD-10-CM | POA: Diagnosis not present

## 2012-04-15 DIAGNOSIS — M546 Pain in thoracic spine: Secondary | ICD-10-CM | POA: Diagnosis not present

## 2012-04-15 LAB — CBC
HCT: 41.1 % (ref 36.0–46.0)
MCHC: 32.6 g/dL (ref 30.0–36.0)
Platelets: 158 10*3/uL (ref 150–400)
RDW: 13.2 % (ref 11.5–15.5)
WBC: 5.9 10*3/uL (ref 4.0–10.5)

## 2012-04-15 LAB — BASIC METABOLIC PANEL
BUN: 10 mg/dL (ref 6–23)
GFR calc Af Amer: >90 mL/min (ref >90)
GFR calc non Af Amer: >90 mL/min (ref >90)
Potassium: 3.7 mEq/L (ref 3.5–5.1)

## 2012-04-15 MED ORDER — OXYCODONE-ACETAMINOPHEN 5-325 MG PO TABS
1.0000 | ORAL_TABLET | Freq: Once | ORAL | Status: AC
  Administered 2012-04-15: 1 via ORAL
  Filled 2012-04-15: qty 1

## 2012-04-15 MED ORDER — IPRATROPIUM BROMIDE 0.02 % IN SOLN
0.5000 mg | Freq: Once | RESPIRATORY_TRACT | Status: AC
  Administered 2012-04-15: 0.5 mg via RESPIRATORY_TRACT
  Filled 2012-04-15: qty 2.5

## 2012-04-15 MED ORDER — PREDNISONE 50 MG PO TABS: 50.0000 mg | ORAL_TABLET | Freq: Every day | ORAL | Status: DC

## 2012-04-15 MED ORDER — PREDNISONE 20 MG PO TABS
60.0000 mg | ORAL_TABLET | Freq: Once | ORAL | Status: AC
  Administered 2012-04-15: 60 mg via ORAL
  Filled 2012-04-15: qty 3

## 2012-04-15 MED ORDER — ALBUTEROL SULFATE (5 MG/ML) 0.5% IN NEBU
5.0000 mg | INHALATION_SOLUTION | Freq: Once | RESPIRATORY_TRACT | Status: AC
  Administered 2012-04-15: 5 mg via RESPIRATORY_TRACT
  Filled 2012-04-15: qty 1

## 2012-04-15 MED ORDER — OXYCODONE-ACETAMINOPHEN 5-325 MG PO TABS: 1.0000 | ORAL_TABLET | Freq: Four times a day (QID) | ORAL | Status: DC | PRN

## 2012-04-15 MED ORDER — MOXIFLOXACIN HCL 400 MG PO TABS: 400.0000 mg | ORAL_TABLET | Freq: Every day | ORAL | Status: DC

## 2012-04-15 NOTE — ED Notes (Signed)
Patient states she has had a cold and cough.  Patient reports left sided thoracic pain and shortness of breath x 2 days.  Patient used her inhaler this morning with no improvement.  Patient denies fevers.

## 2012-04-15 NOTE — ED Provider Notes (Signed)
History     CSN: [id number]  Arrival date & time 04/15/12  1100   First MD Initiated Contact with Patient 04/15/12 1104      Chief Complaint  Patient presents with  . Shortness of Breath    (Consider location/radiation/quality/duration/timing/severity/associated sxs/prior treatment) HPI Pt presents with c/o cough and shortness of breath.  She also c/o chonic low back pain that radiates down into left hip and leg.  She states she has felt sob x 2 days, has been using her inhaler at home without much improvement.  No chest pain, no leg swelling.  No weakness of legs or urinary retention or incontinence of bowel or bladder.  She states she saw a specialist about her back pain and they "wouldn't do anything for me".  There are no other associated systemic symptoms, there are no other alleviating or modifying factors.   Past Medical History  Diagnosis Date  . Hypertension   . Asthma   . COPD (chronic obstructive pulmonary disease)   . Anxiety   . Cancer   . Colon cancer   . Dyslipidemia   . Migraine headache   . Chronic bronchitis   . Uterine fibroid   . Ovarian cyst   . GERD (gastroesophageal reflux disease)   . Morbid obesity   . Obstructive sleep apnea     mild  . Chronic pain   . Chronic back pain   . Arthritis   . Osteoarthritis   . Depression     Past Surgical History  Procedure Date  . Knee arthroscopy     bil.  . Carpal tunnel release     rt  . Mouth surgery     teeth extraction  . Cesarean section   . Subtotal colectomy   . Colonoscopy   . Tubal ligation     Family History  Problem Relation Age of Onset  . Uterine cancer Mother   . Stroke Father   . Colon cancer Maternal Uncle   . Diabetes Father   . Ovarian cancer Mother   . Hypertension Father   . Breast cancer Maternal Grandmother   . Diabetes Sister   . Asthma Child   . Asthma Child     History  Substance Use Topics  . Smoking status: Current Some Day Smoker -- 1.0 packs/day for 30 years     Types: Cigarettes  . Smokeless tobacco: Never Used     Comment: tobacco info given 07/21/2011  . Alcohol Use: No    OB History    Grav Para Term Preterm Abortions TAB SAB Ect Mult Living   2 2 2       2       Review of Systems ROS reviewed and all otherwise negative except for mentioned in HPI  Allergies  Aspirin and Kiwi extract  Home Medications   Current Outpatient Rx  Name  Route  Sig  Dispense  Refill  . ALBUTEROL SULFATE HFA 108 (90 BASE) MCG/ACT IN AERS   Inhalation   Inhale 2 puffs into the lungs every 6 (six) hours as needed. For shortness of breeath         . ALPRAZOLAM 0.25 MG PO TABS   Oral   Take 0.25 mg by mouth 2 (two) times daily. anxiety         . HYDROCHLOROTHIAZIDE 25 MG PO TABS   Oral   Take 25 mg by mouth daily.          . IBUPROFEN 200 MG  PO TABS   Oral   Take 400 mg by mouth every 8 (eight) hours as needed. For pain.         Marland Kitchen OMEPRAZOLE 40 MG PO CPDR   Oral   Take 40 mg by mouth daily.          . TRAZODONE HCL 50 MG PO TABS   Oral   Take 50 mg by mouth daily.          Marland Kitchen MOXIFLOXACIN HCL 400 MG PO TABS   Oral   Take 1 tablet (400 mg total) by mouth daily.   10 tablet   0   . OXYCODONE-ACETAMINOPHEN 5-325 MG PO TABS   Oral   Take 1-2 tablets by mouth every 6 (six) hours as needed for pain.   15 tablet   0   . PREDNISONE 50 MG PO TABS   Oral   Take 1 tablet (50 mg total) by mouth daily.   4 tablet   0     BP 148/63  Pulse 77  Temp 98.5 F (36.9 C) (Oral)  Resp 22  Ht 4\' 9"  (1.448 m)  Wt 245 lb (111.131 kg)  BMI 53.02 kg/m2  SpO2 99%  LMP 05/24/2003 Vitals reviewed Physical Exam Physical Examination: General appearance - alert, well appearing, and in no distress Mental status - alert, oriented to person, place, and time Eyes - no conjunctival injection, no scleral icterus Mouth - mucous membranes moist, pharynx normal without lesions Chest - bilateral expiratory wheezing, good air movement, symmetric  air entry, no increased respiratory effort or dyspnea Heart - normal rate, regular rhythm, normal S1, S2, no murmurs, rubs, clicks or gallops Abdomen - soft, nontender, nondistended, no masses or organomegaly Back exam - some pain with movement in bed, left paraspinous tenderness to palpation Extremities - peripheral pulses normal, no pedal edema, no clubbing or cyanosis Skin - normal coloration and turgor, no rashes  ED Course  Procedures (including critical care time)   Date: 04/15/2012  Rate: 117  Rhythm: tachycardia/ventricular bigeminy  QRS Axis: normal  Intervals: indeterminate  ST/T Wave abnormalities: nonspecific ST/T changes  Conduction Disutrbances:bigeminy  Narrative Interpretation:   Old EKG Reviewed: unchanged   1:44 PM Pt's breathing is improved, she was given percocet for her chronic back/leg pain.  She states this is not enough pain medication- have offered a second percocet and she declines.  I have discussed with her that since her pain is chronic in nature she will need to f/u with primary care doctor.      Labs Reviewed  CBC  BASIC METABOLIC PANEL   Dg Chest 2 View  04/15/2012  *RADIOLOGY REPORT*  Clinical Data: Shortness of breath  CHEST - 2 VIEW  Comparison: 10/21/11  Findings: Cardiomediastinal silhouette is stable.  No acute infiltrate or pleural effusion.  No pulmonary edema.  Slight worsening central bronchitic changes.  Mild degenerative changes thoracic spine.  IMPRESSION: No acute infiltrate or pulmonary edema.  Slight worsening central bronchitic changes. Mild degenerative changes thoracic spine.   Original Report Authenticated By: Natasha Mead, M.D.      1. COPD exacerbation   2. Chronic back pain       MDM  Pt with hx of asthma/copd presenting with shortness of breath and wheezing, she also c/o chronic low back pain and left hip pain.  Pt feels improved after nebs and steroids, her CXR shows bronchitic changes- no acute infiltrate.  Pt continuing  to request more pain medication- she  has been offered a second percocet and refuses this.  D/w her the importance of f/u with her pmd for chronic back pain.  Plan for discharge with steroid course.  Discharged with strict return precautions.  Pt agreeable with plan.        Ethelda Chick, MD 04/15/12 1530

## 2012-04-30 DIAGNOSIS — J45909 Unspecified asthma, uncomplicated: Secondary | ICD-10-CM | POA: Diagnosis not present

## 2012-04-30 DIAGNOSIS — M545 Low back pain: Secondary | ICD-10-CM | POA: Diagnosis not present

## 2012-04-30 DIAGNOSIS — M79609 Pain in unspecified limb: Secondary | ICD-10-CM | POA: Diagnosis not present

## 2012-04-30 DIAGNOSIS — E669 Obesity, unspecified: Secondary | ICD-10-CM | POA: Diagnosis not present

## 2012-05-12 ENCOUNTER — Encounter (HOSPITAL_COMMUNITY): Payer: Self-pay | Admitting: Emergency Medicine

## 2012-05-12 ENCOUNTER — Emergency Department (HOSPITAL_COMMUNITY)
Admission: EM | Admit: 2012-05-12 | Discharge: 2012-05-12 | Disposition: A | Discharge status: Home / Self Care | Payer: Medicare Other | Attending: Emergency Medicine | Admitting: Emergency Medicine

## 2012-05-12 ENCOUNTER — Emergency Department (HOSPITAL_COMMUNITY): Payer: Medicare Other

## 2012-05-12 DIAGNOSIS — M129 Arthropathy, unspecified: Secondary | ICD-10-CM | POA: Diagnosis not present

## 2012-05-12 DIAGNOSIS — Z85038 Personal history of other malignant neoplasm of large intestine: Secondary | ICD-10-CM | POA: Diagnosis not present

## 2012-05-12 DIAGNOSIS — F329 Major depressive disorder, single episode, unspecified: Secondary | ICD-10-CM | POA: Diagnosis not present

## 2012-05-12 DIAGNOSIS — Z8742 Personal history of other diseases of the female genital tract: Secondary | ICD-10-CM | POA: Diagnosis not present

## 2012-05-12 DIAGNOSIS — J45909 Unspecified asthma, uncomplicated: Secondary | ICD-10-CM | POA: Insufficient documentation

## 2012-05-12 DIAGNOSIS — E785 Hyperlipidemia, unspecified: Secondary | ICD-10-CM | POA: Diagnosis not present

## 2012-05-12 DIAGNOSIS — G4733 Obstructive sleep apnea (adult) (pediatric): Secondary | ICD-10-CM | POA: Diagnosis not present

## 2012-05-12 DIAGNOSIS — F17200 Nicotine dependence, unspecified, uncomplicated: Secondary | ICD-10-CM | POA: Insufficient documentation

## 2012-05-12 DIAGNOSIS — I10 Essential (primary) hypertension: Secondary | ICD-10-CM | POA: Diagnosis not present

## 2012-05-12 DIAGNOSIS — M1990 Unspecified osteoarthritis, unspecified site: Secondary | ICD-10-CM

## 2012-05-12 DIAGNOSIS — Z8739 Personal history of other diseases of the musculoskeletal system and connective tissue: Secondary | ICD-10-CM | POA: Diagnosis not present

## 2012-05-12 DIAGNOSIS — M25579 Pain in unspecified ankle and joints of unspecified foot: Secondary | ICD-10-CM | POA: Diagnosis not present

## 2012-05-12 DIAGNOSIS — M79606 Pain in leg, unspecified: Secondary | ICD-10-CM

## 2012-05-12 DIAGNOSIS — J449 Chronic obstructive pulmonary disease, unspecified: Secondary | ICD-10-CM | POA: Diagnosis not present

## 2012-05-12 DIAGNOSIS — M549 Dorsalgia, unspecified: Secondary | ICD-10-CM

## 2012-05-12 DIAGNOSIS — M79609 Pain in unspecified limb: Secondary | ICD-10-CM | POA: Diagnosis not present

## 2012-05-12 DIAGNOSIS — IMO0002 Reserved for concepts with insufficient information to code with codable children: Secondary | ICD-10-CM | POA: Diagnosis not present

## 2012-05-12 DIAGNOSIS — G8929 Other chronic pain: Secondary | ICD-10-CM | POA: Diagnosis not present

## 2012-05-12 DIAGNOSIS — M25569 Pain in unspecified knee: Secondary | ICD-10-CM | POA: Diagnosis not present

## 2012-05-12 DIAGNOSIS — S8990XA Unspecified injury of unspecified lower leg, initial encounter: Secondary | ICD-10-CM | POA: Diagnosis not present

## 2012-05-12 DIAGNOSIS — Z79899 Other long term (current) drug therapy: Secondary | ICD-10-CM | POA: Diagnosis not present

## 2012-05-12 DIAGNOSIS — Z8679 Personal history of other diseases of the circulatory system: Secondary | ICD-10-CM | POA: Insufficient documentation

## 2012-05-12 DIAGNOSIS — F3289 Other specified depressive episodes: Secondary | ICD-10-CM | POA: Insufficient documentation

## 2012-05-12 DIAGNOSIS — M545 Low back pain: Secondary | ICD-10-CM | POA: Diagnosis not present

## 2012-05-12 DIAGNOSIS — J4489 Other specified chronic obstructive pulmonary disease: Secondary | ICD-10-CM | POA: Insufficient documentation

## 2012-05-12 DIAGNOSIS — S99929A Unspecified injury of unspecified foot, initial encounter: Secondary | ICD-10-CM | POA: Diagnosis not present

## 2012-05-12 MED ORDER — KETOROLAC TROMETHAMINE 60 MG/2ML IM SOLN
60.0000 mg | Freq: Once | INTRAMUSCULAR | Status: AC
Start: 2012-05-12 — End: 2012-05-12
  Administered 2012-05-12: 60 mg via INTRAMUSCULAR
  Filled 2012-05-12: qty 2

## 2012-05-12 MED ORDER — HYDROCODONE-ACETAMINOPHEN 5-325 MG PO TABS: 2.0000 | ORAL_TABLET | ORAL | Status: DC | PRN

## 2012-05-12 NOTE — ED Notes (Signed)
Patient states that she fell onto the heel of a shoe and - she now has a large area of swelling to her left leg

## 2012-05-12 NOTE — ED Provider Notes (Signed)
History     CSN: [id number]  Arrival date & time 05/12/12  Barry Brunner   First MD Initiated Contact with Patient 05/12/12 2005      Chief Complaint  Patient presents with  . Leg Pain    (Consider location/radiation/quality/duration/timing/severity/associated sxs/prior treatment) HPI Comments: This is a 56 year old female, who presents emergency department with chief complaints of leg pain. Patient states that she fell onto the heel of the shoe earlier today. She reports new swelling at the site. She also states that she has chronic back pain and leg pain. She is managing her symptoms with Percocet 10, with little relief. She states that her pain is 10 out of 10. Patient denies headache, blurred vision, new hearing loss, sore throat, chest pain, shortness of breath, nausea, vomiting, diarrhea, constipation, dysuria, peripheral edema.   The history is provided by the patient. No language interpreter was used.    Past Medical History  Diagnosis Date  . Hypertension   . Asthma   . COPD (chronic obstructive pulmonary disease)   . Anxiety   . Cancer   . Colon cancer   . Dyslipidemia   . Migraine headache   . Chronic bronchitis   . Uterine fibroid   . Ovarian cyst   . GERD (gastroesophageal reflux disease)   . Morbid obesity   . Obstructive sleep apnea     mild  . Chronic pain   . Chronic back pain   . Arthritis   . Osteoarthritis   . Depression     Past Surgical History  Procedure Date  . Knee arthroscopy     bil.  . Carpal tunnel release     rt  . Mouth surgery     teeth extraction  . Cesarean section   . Subtotal colectomy   . Colonoscopy   . Tubal ligation     Family History  Problem Relation Age of Onset  . Uterine cancer Mother   . Stroke Father   . Colon cancer Maternal Uncle   . Diabetes Father   . Ovarian cancer Mother   . Hypertension Father   . Breast cancer Maternal Grandmother   . Diabetes Sister   . Asthma Child   . Asthma Child     History   Substance Use Topics  . Smoking status: Current Some Day Smoker -- 1.0 packs/day for 30 years    Types: Cigarettes  . Smokeless tobacco: Never Used     Comment: tobacco info given 07/21/2011  . Alcohol Use: No    OB History    Grav Para Term Preterm Abortions TAB SAB Ect Mult Living   2 2 2       2       Review of Systems  All other systems reviewed and are negative.    Allergies  Aspirin and Kiwi extract  Home Medications   Current Outpatient Rx  Name  Route  Sig  Dispense  Refill  . ALBUTEROL SULFATE HFA 108 (90 BASE) MCG/ACT IN AERS   Inhalation   Inhale 2 puffs into the lungs every 6 (six) hours as needed. For shortness of breeath         . ALPRAZOLAM 0.25 MG PO TABS   Oral   Take 0.25 mg by mouth 2 (two) times daily. anxiety         . HYDROCHLOROTHIAZIDE 25 MG PO TABS   Oral   Take 25 mg by mouth daily.          Marland Kitchen  IBUPROFEN 200 MG PO TABS   Oral   Take 400 mg by mouth every 8 (eight) hours as needed. For pain.         Marland Kitchen MOXIFLOXACIN HCL 400 MG PO TABS   Oral   Take 1 tablet (400 mg total) by mouth daily.   10 tablet   0   . OMEPRAZOLE 40 MG PO CPDR   Oral   Take 40 mg by mouth daily.          . OXYCODONE-ACETAMINOPHEN 5-325 MG PO TABS   Oral   Take 1-2 tablets by mouth every 6 (six) hours as needed for pain.   15 tablet   0   . PREDNISONE 50 MG PO TABS   Oral   Take 1 tablet (50 mg total) by mouth daily.   4 tablet   0   . TRAZODONE HCL 50 MG PO TABS   Oral   Take 50 mg by mouth at bedtime.            BP 105/75  Pulse 103  Temp 98.6 F (37 C) (Oral)  Resp 18  SpO2 100%  LMP 05/24/2003  Physical Exam  Nursing note and vitals reviewed. Constitutional: She is oriented to person, place, and time. She appears well-developed and well-nourished.  HENT:  Head: Normocephalic and atraumatic.  Eyes: Conjunctivae normal and EOM are normal. Pupils are equal, round, and reactive to light.  Neck: Normal range of motion. Neck  supple.  Cardiovascular: Normal rate and regular rhythm.  Exam reveals no gallop and no friction rub.   No murmur heard. Pulmonary/Chest: Effort normal and breath sounds normal. No respiratory distress. She has no wheezes. She has no rales. She exhibits no tenderness.  Abdominal: Soft. Bowel sounds are normal. She exhibits no distension and no mass. There is no tenderness. There is no rebound and no guarding.  Musculoskeletal: Normal range of motion. She exhibits no edema and no tenderness.       Left lower leg pain on palpation following a fall, distal pulses intact, no redness or swelling  Neurological: She is alert and oriented to person, place, and time.  Skin: Skin is warm and dry.  Psychiatric: She has a normal mood and affect. Her behavior is normal. Judgment and thought content normal.    ED Course  Procedures (including critical care time)  Results for orders placed during the hospital encounter of 04/15/12  CBC      Component Value Range   WBC 5.9  4.0 - 10.5 K/uL   RBC 4.21  3.87 - 5.11 MIL/uL   Hemoglobin 13.4  12.0 - 15.0 g/dL   HCT 16.1  09.6 - 04.5 %   MCV 97.6  78.0 - 100.0 fL   MCH 31.8  26.0 - 34.0 pg   MCHC 32.6  30.0 - 36.0 g/dL   RDW 40.9  81.1 - 91.4 %   Platelets 158  150 - 400 K/uL  BASIC METABOLIC PANEL      Component Value Range   Sodium 137  135 - 145 mEq/L   Potassium 3.7  3.5 - 5.1 mEq/L   Chloride 102  96 - 112 mEq/L   CO2 28  19 - 32 mEq/L   Glucose, Bld 87  70 - 99 mg/dL   BUN 10  6 - 23 mg/dL   Creatinine, Ser 7.82  0.50 - 1.10 mg/dL   Calcium 8.8  8.4 - 95.6 mg/dL   GFR calc non Af  Amer >90  >90 mL/min   GFR calc Af Amer >90  >90 mL/min   Dg Chest 2 View  04/15/2012  *RADIOLOGY REPORT*  Clinical Data: Shortness of breath  CHEST - 2 VIEW  Comparison: 10/21/11  Findings: Cardiomediastinal silhouette is stable.  No acute infiltrate or pleural effusion.  No pulmonary edema.  Slight worsening central bronchitic changes.  Mild degenerative changes  thoracic spine.  IMPRESSION: No acute infiltrate or pulmonary edema.  Slight worsening central bronchitic changes. Mild degenerative changes thoracic spine.   Original Report Authenticated By: Natasha Mead, M.D.    Dg Lumbar Spine Complete  05/12/2012  *RADIOLOGY REPORT*  Clinical Data: Fall with low back pain.  LUMBAR SPINE - COMPLETE 4+ VIEW  Comparison: MRI of the lumbar spine dated 01/03/2012  Findings: Spondylosis again identified, primarily at L4-5 and L5- S1.  There is a mild, grade 1 anterolisthesis of L4 on L5.  No acute fracture identified.  IMPRESSION: Stable spondylosis of the lower lumbar spine when compared to the prior MRI.  No acute fracture is identified.   Original Report Authenticated By: Irish Lack, M.D.    Dg Ankle Complete Left  05/12/2012  *RADIOLOGY REPORT*  Clinical Data: Fall with left ankle pain.  LEFT ANKLE COMPLETE - 3+ VIEW  Comparison: None  Findings: No acute fracture or dislocation.  Soft tissues are unremarkable.  Mild degenerative changes are seen medially.  IMPRESSION: No acute fracture.   Original Report Authenticated By: Irish Lack, M.D.    Dg Knee Complete 4 Views Left  05/12/2012  *RADIOLOGY REPORT*  Clinical Data: Fall with left knee pain.  LEFT KNEE - COMPLETE 4+ VIEW  Comparison: None.  Findings: Moderate tricompartmental degenerative osteoarthritis present.  No evidence of fracture, dislocation or joint effusion. No bony lesions identified.  IMPRESSION: Degenerative osteoarthritis.  No acute findings.   Original Report Authenticated By: Irish Lack, M.D.       1. Arthritis   2. Morbid obesity   3. Back pain   4. Leg pain       MDM  56 year old female with leg pain. I told the patient that as there is no acute process seen on x-ray, this is likely acute on chronic leg pain and that she needs to be seen by and chronic pain specialist. She agrees, but is frustrated because pain medicines did not control her pain. I advised the patient of this is  why she is to see the pain specialist. She is stable and ready for discharge. Return precautions have been given. I do not see any evidence of an emergent process such as DVT. I told the patient that she can have a few pain medicines here but that she will need to establish care in order to get more. I told her that we'll not refill her pain medicine after this.        Roxy Horseman, PA-C 05/12/12 2340

## 2012-05-13 NOTE — ED Provider Notes (Signed)
Medical screening examination/treatment/procedure(s) were performed by non-physician practitioner and as supervising physician I was immediately available for consultation/collaboration.  Gedalia Mcmillon, MD 05/13/12 0008 

## 2012-05-31 DIAGNOSIS — I10 Essential (primary) hypertension: Secondary | ICD-10-CM | POA: Diagnosis not present

## 2012-05-31 DIAGNOSIS — M79609 Pain in unspecified limb: Secondary | ICD-10-CM | POA: Diagnosis not present

## 2012-05-31 DIAGNOSIS — M545 Low back pain: Secondary | ICD-10-CM | POA: Diagnosis not present

## 2012-05-31 DIAGNOSIS — E669 Obesity, unspecified: Secondary | ICD-10-CM | POA: Diagnosis not present

## 2012-06-09 IMAGING — CR DG CHEST 2V
2 series · 2 of 2 positions shown · non-contrast
Comparison: 07/26/2010

CLINICAL DATA: Cough.

CHEST - 2 VIEW

[w chest pa]
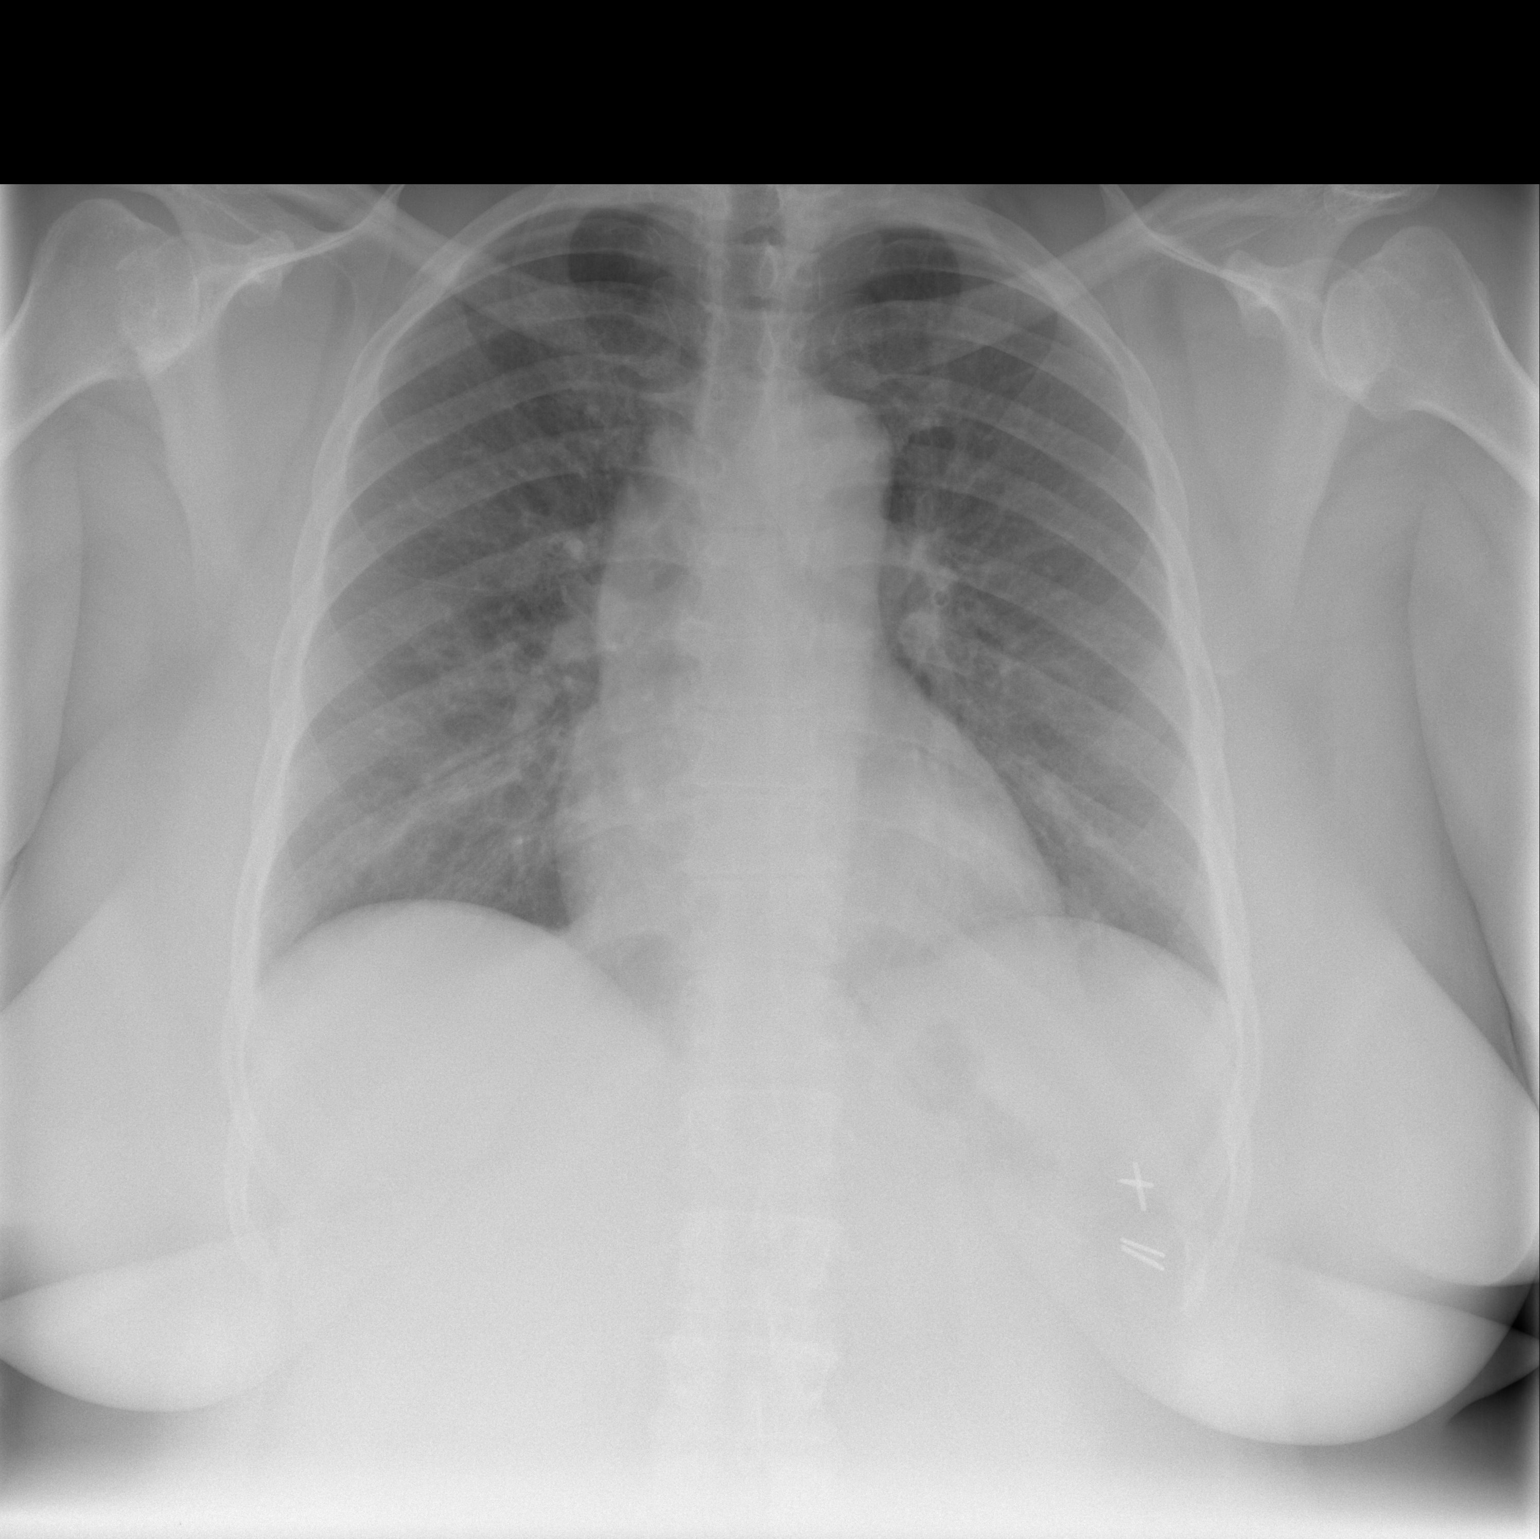

[w chest lat]
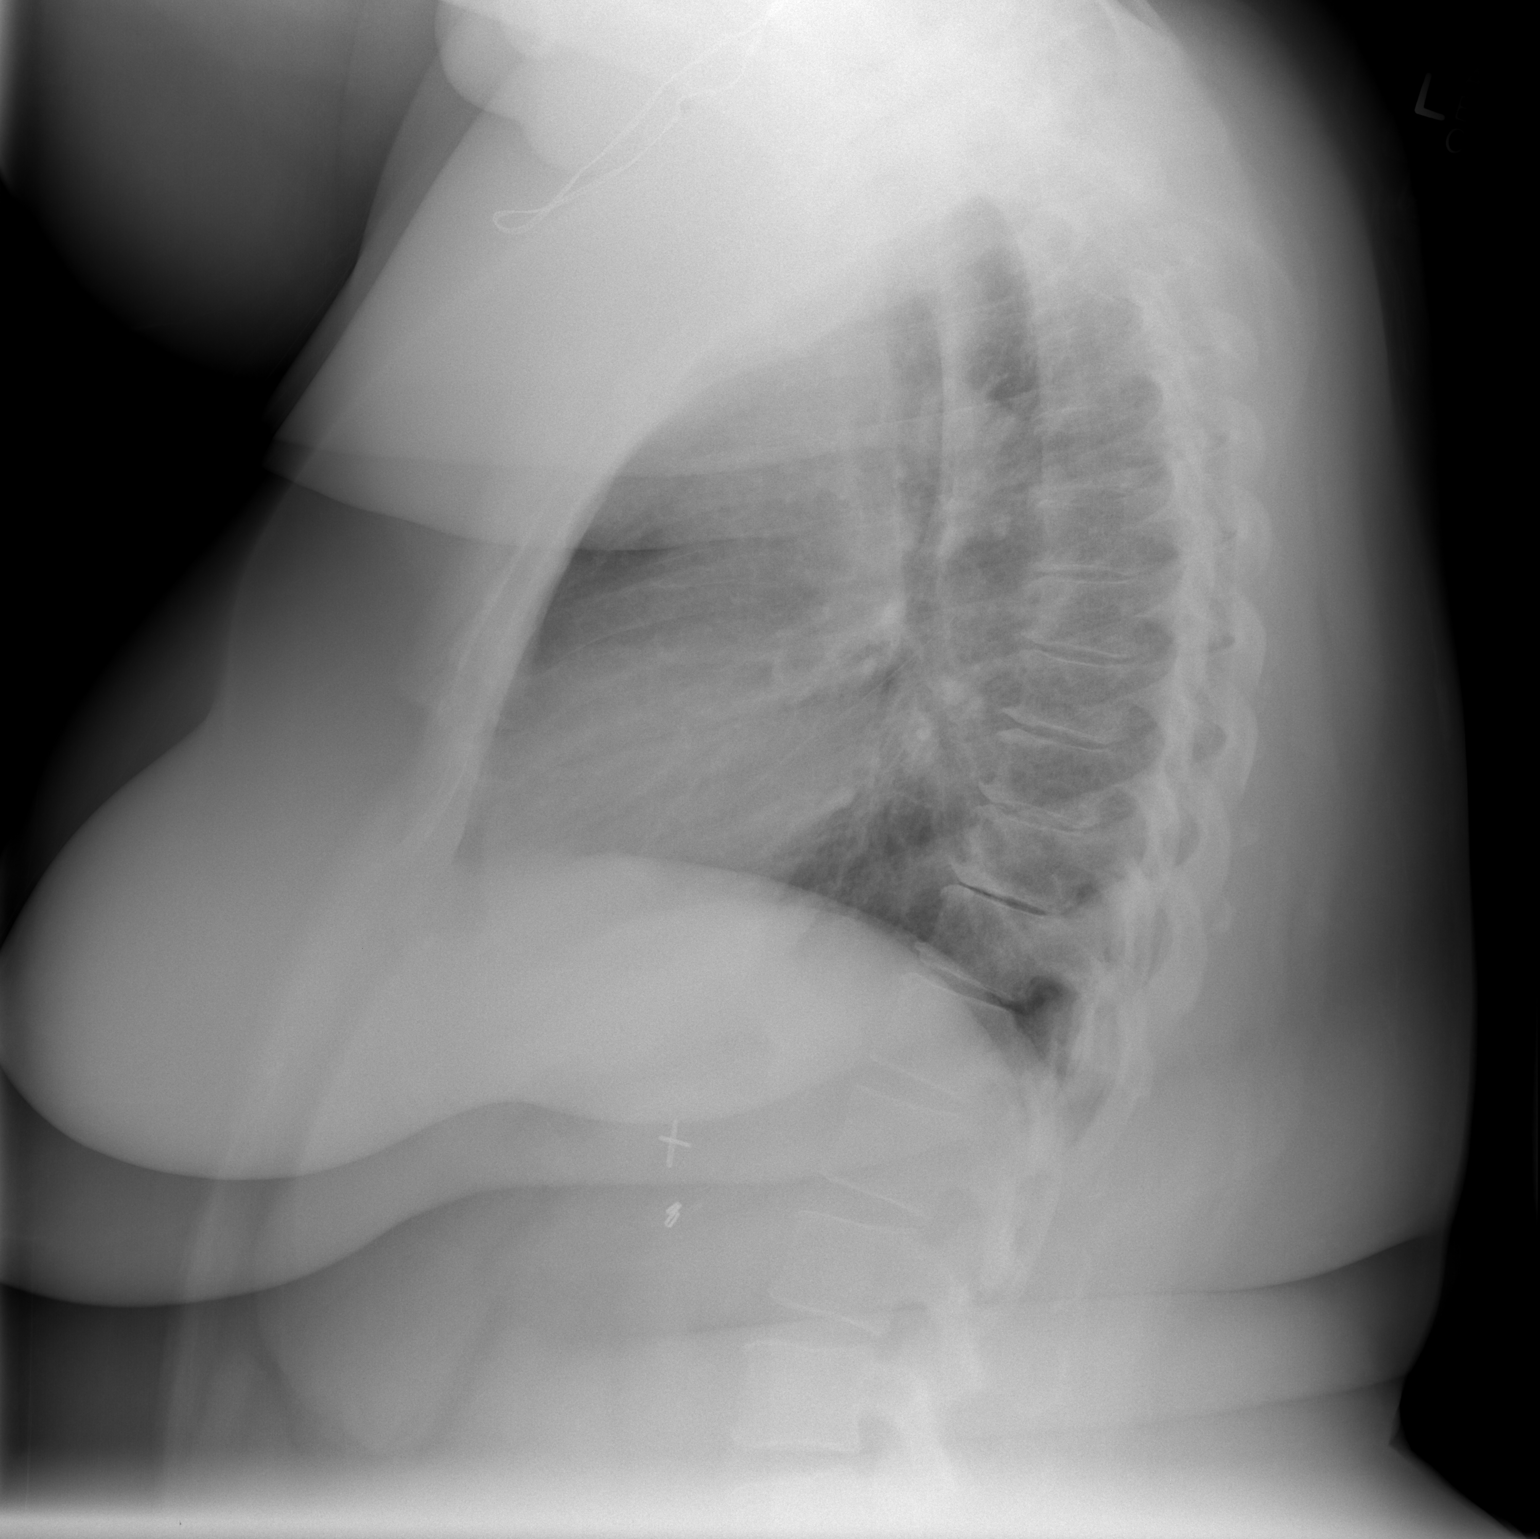

[2 of 2 positions shown; findings below may reference images not displayed]

FINDINGS: The cardiomediastinal silhouette is unremarkable.
Mild peribronchial thickening is stable.
There is no evidence of focal airspace disease, pulmonary edema,
pulmonary nodule/mass, pleural effusion, or pneumothorax.
No acute bony abnormalities are identified.
IMPRESSION: No evidence of acute cardiopulmonary disease.

Mild chronic peribronchial thickening.

## 2012-06-29 DIAGNOSIS — F411 Generalized anxiety disorder: Secondary | ICD-10-CM | POA: Diagnosis not present

## 2012-06-29 DIAGNOSIS — M545 Low back pain: Secondary | ICD-10-CM | POA: Diagnosis not present

## 2012-06-29 DIAGNOSIS — I10 Essential (primary) hypertension: Secondary | ICD-10-CM | POA: Diagnosis not present

## 2012-06-29 DIAGNOSIS — E669 Obesity, unspecified: Secondary | ICD-10-CM | POA: Diagnosis not present

## 2012-08-04 DIAGNOSIS — E669 Obesity, unspecified: Secondary | ICD-10-CM | POA: Diagnosis not present

## 2012-08-04 DIAGNOSIS — I10 Essential (primary) hypertension: Secondary | ICD-10-CM | POA: Diagnosis not present

## 2012-08-04 DIAGNOSIS — M79609 Pain in unspecified limb: Secondary | ICD-10-CM | POA: Diagnosis not present

## 2012-08-04 DIAGNOSIS — F411 Generalized anxiety disorder: Secondary | ICD-10-CM | POA: Diagnosis not present

## 2012-08-18 ENCOUNTER — Inpatient Hospital Stay (HOSPITAL_COMMUNITY)
Admission: EM | Admit: 2012-08-18 | Discharge: 2012-08-23 | DRG: 191 | Disposition: A | Discharge status: Home / Self Care | Payer: Medicare Other | Attending: Internal Medicine | Admitting: Internal Medicine

## 2012-08-18 ENCOUNTER — Encounter (HOSPITAL_COMMUNITY): Payer: Self-pay

## 2012-08-18 ENCOUNTER — Emergency Department (HOSPITAL_COMMUNITY): Payer: Medicare Other

## 2012-08-18 DIAGNOSIS — J45901 Unspecified asthma with (acute) exacerbation: Secondary | ICD-10-CM | POA: Diagnosis not present

## 2012-08-18 DIAGNOSIS — G894 Chronic pain syndrome: Secondary | ICD-10-CM | POA: Diagnosis not present

## 2012-08-18 DIAGNOSIS — R7309 Other abnormal glucose: Secondary | ICD-10-CM | POA: Diagnosis present

## 2012-08-18 DIAGNOSIS — E876 Hypokalemia: Secondary | ICD-10-CM | POA: Diagnosis present

## 2012-08-18 DIAGNOSIS — R059 Cough, unspecified: Secondary | ICD-10-CM | POA: Diagnosis not present

## 2012-08-18 DIAGNOSIS — R071 Chest pain on breathing: Secondary | ICD-10-CM | POA: Diagnosis not present

## 2012-08-18 DIAGNOSIS — J441 Chronic obstructive pulmonary disease with (acute) exacerbation: Principal | ICD-10-CM | POA: Diagnosis present

## 2012-08-18 DIAGNOSIS — R05 Cough: Secondary | ICD-10-CM | POA: Diagnosis not present

## 2012-08-18 DIAGNOSIS — F17200 Nicotine dependence, unspecified, uncomplicated: Secondary | ICD-10-CM | POA: Diagnosis present

## 2012-08-18 DIAGNOSIS — I10 Essential (primary) hypertension: Secondary | ICD-10-CM | POA: Diagnosis present

## 2012-08-18 DIAGNOSIS — F411 Generalized anxiety disorder: Secondary | ICD-10-CM | POA: Diagnosis present

## 2012-08-18 DIAGNOSIS — Z6841 Body Mass Index (BMI) 40.0 and over, adult: Secondary | ICD-10-CM

## 2012-08-18 DIAGNOSIS — Z720 Tobacco use: Secondary | ICD-10-CM | POA: Diagnosis present

## 2012-08-18 DIAGNOSIS — K219 Gastro-esophageal reflux disease without esophagitis: Secondary | ICD-10-CM | POA: Diagnosis present

## 2012-08-18 DIAGNOSIS — R079 Chest pain, unspecified: Secondary | ICD-10-CM | POA: Diagnosis not present

## 2012-08-18 DIAGNOSIS — K5900 Constipation, unspecified: Secondary | ICD-10-CM | POA: Diagnosis present

## 2012-08-18 DIAGNOSIS — G4700 Insomnia, unspecified: Secondary | ICD-10-CM | POA: Diagnosis present

## 2012-08-18 DIAGNOSIS — F419 Anxiety disorder, unspecified: Secondary | ICD-10-CM | POA: Diagnosis present

## 2012-08-18 DIAGNOSIS — E785 Hyperlipidemia, unspecified: Secondary | ICD-10-CM

## 2012-08-18 DIAGNOSIS — R51 Headache: Secondary | ICD-10-CM | POA: Diagnosis not present

## 2012-08-18 DIAGNOSIS — R739 Hyperglycemia, unspecified: Secondary | ICD-10-CM | POA: Diagnosis present

## 2012-08-18 DIAGNOSIS — T50995A Adverse effect of other drugs, medicaments and biological substances, initial encounter: Secondary | ICD-10-CM | POA: Diagnosis present

## 2012-08-18 DIAGNOSIS — K5909 Other constipation: Secondary | ICD-10-CM | POA: Diagnosis present

## 2012-08-18 DIAGNOSIS — R5383 Other fatigue: Secondary | ICD-10-CM | POA: Diagnosis not present

## 2012-08-18 DIAGNOSIS — R5381 Other malaise: Secondary | ICD-10-CM | POA: Diagnosis not present

## 2012-08-18 DIAGNOSIS — R519 Headache, unspecified: Secondary | ICD-10-CM | POA: Diagnosis present

## 2012-08-18 LAB — INFLUENZA PANEL BY PCR (TYPE A & B): Influenza B By PCR: NEGATIVE

## 2012-08-18 LAB — BASIC METABOLIC PANEL
BUN: 5 mg/dL — ABNORMAL LOW (ref 6–23)
Calcium: 8.8 mg/dL (ref 8.4–10.5)
Creatinine, Ser: 0.65 mg/dL (ref 0.50–1.10)
GFR calc non Af Amer: >90 mL/min (ref >90)
Glucose, Bld: 102 mg/dL — ABNORMAL HIGH (ref 70–99)
Sodium: 140 mEq/L (ref 135–145)

## 2012-08-18 LAB — CBC WITH DIFFERENTIAL/PLATELET
Eosinophils Absolute: 0.1 10*3/uL (ref 0.0–0.7)
Eosinophils Relative: 1 % (ref 0–5)
HCT: 39.7 % (ref 36.0–46.0)
Lymphs Abs: 1.9 10*3/uL (ref 0.7–4.0)
MCH: 31.4 pg (ref 26.0–34.0)
MCV: 97.5 fL (ref 78.0–100.0)
Monocytes Absolute: 0.7 10*3/uL (ref 0.1–1.0)
Platelets: 166 10*3/uL (ref 150–400)
RBC: 4.07 MIL/uL (ref 3.87–5.11)

## 2012-08-18 MED ORDER — SODIUM CHLORIDE 0.9 % IV SOLN
INTRAVENOUS | Status: AC
  Administered 2012-08-18 – 2012-08-19 (×2): via INTRAVENOUS

## 2012-08-18 MED ORDER — IBUPROFEN 400 MG PO TABS
400.0000 mg | ORAL_TABLET | ORAL | Status: DC | PRN
  Administered 2012-08-19 – 2012-08-20 (×3): 400 mg via ORAL
  Filled 2012-08-18 (×4): qty 1

## 2012-08-18 MED ORDER — ALBUTEROL SULFATE (5 MG/ML) 0.5% IN NEBU
2.5000 mg | INHALATION_SOLUTION | Freq: Four times a day (QID) | RESPIRATORY_TRACT | Status: DC
  Administered 2012-08-18 – 2012-08-20 (×9): 2.5 mg via RESPIRATORY_TRACT
  Filled 2012-08-18 (×9): qty 0.5

## 2012-08-18 MED ORDER — IPRATROPIUM BROMIDE 0.02 % IN SOLN
0.5000 mg | Freq: Four times a day (QID) | RESPIRATORY_TRACT | Status: DC
  Administered 2012-08-19 – 2012-08-20 (×8): 0.5 mg via RESPIRATORY_TRACT
  Filled 2012-08-18 (×9): qty 2.5

## 2012-08-18 MED ORDER — METHYLPREDNISOLONE SODIUM SUCC 125 MG IJ SOLR
60.0000 mg | Freq: Four times a day (QID) | INTRAMUSCULAR | Status: DC
  Administered 2012-08-18: 17:00:00 via INTRAVENOUS
  Administered 2012-08-19 – 2012-08-22 (×13): 60 mg via INTRAVENOUS
  Filled 2012-08-18 (×20): qty 0.96

## 2012-08-18 MED ORDER — ONDANSETRON HCL 4 MG/2ML IJ SOLN: 4.0000 mg | Freq: Three times a day (TID) | INTRAMUSCULAR | Status: DC | PRN

## 2012-08-18 MED ORDER — ALBUTEROL SULFATE (5 MG/ML) 0.5% IN NEBU
2.5000 mg | INHALATION_SOLUTION | Freq: Once | RESPIRATORY_TRACT | Status: AC
  Administered 2012-08-18: 2.5 mg via RESPIRATORY_TRACT
  Filled 2012-08-18: qty 0.5

## 2012-08-18 MED ORDER — GUAIFENESIN-CODEINE 100-10 MG/5ML PO SOLN
5.0000 mL | ORAL | Status: DC | PRN
  Administered 2012-08-18 – 2012-08-20 (×6): 5 mL via ORAL
  Filled 2012-08-18 (×6): qty 5

## 2012-08-18 MED ORDER — ALBUTEROL SULFATE (5 MG/ML) 0.5% IN NEBU
2.5000 mg | INHALATION_SOLUTION | RESPIRATORY_TRACT | Status: AC | PRN
  Administered 2012-08-18: 2.5 mg via RESPIRATORY_TRACT
  Filled 2012-08-18: qty 0.5

## 2012-08-18 MED ORDER — ALBUTEROL SULFATE (5 MG/ML) 0.5% IN NEBU
2.5000 mg | INHALATION_SOLUTION | Freq: Once | RESPIRATORY_TRACT | Status: AC
  Administered 2012-08-18: 2.5 mg via RESPIRATORY_TRACT
  Filled 2012-08-18: qty 1

## 2012-08-18 MED ORDER — HYDROCHLOROTHIAZIDE 25 MG PO TABS
25.0000 mg | ORAL_TABLET | Freq: Every day | ORAL | Status: DC
  Administered 2012-08-18 – 2012-08-23 (×6): 25 mg via ORAL
  Filled 2012-08-18 (×6): qty 1

## 2012-08-18 MED ORDER — METHYLPREDNISOLONE SODIUM SUCC 125 MG IJ SOLR
125.0000 mg | Freq: Once | INTRAMUSCULAR | Status: AC
  Administered 2012-08-18: 125 mg via INTRAVENOUS
  Filled 2012-08-18: qty 2

## 2012-08-18 MED ORDER — INFLUENZA VIRUS VACC SPLIT PF IM SUSP
0.5000 mL | INTRAMUSCULAR | Status: AC
  Administered 2012-08-19: 0.5 mL via INTRAMUSCULAR
  Filled 2012-08-18 (×2): qty 0.5

## 2012-08-18 MED ORDER — IPRATROPIUM BROMIDE 0.02 % IN SOLN
0.5000 mg | Freq: Once | RESPIRATORY_TRACT | Status: AC
  Administered 2012-08-18: 0.5 mg via RESPIRATORY_TRACT
  Filled 2012-08-18: qty 2.5

## 2012-08-18 MED ORDER — NICOTINE 21 MG/24HR TD PT24
21.0000 mg | MEDICATED_PATCH | Freq: Every day | TRANSDERMAL | Status: DC
  Administered 2012-08-18 – 2012-08-23 (×6): 21 mg via TRANSDERMAL
  Filled 2012-08-18 (×7): qty 1

## 2012-08-18 MED ORDER — PANTOPRAZOLE SODIUM 40 MG PO TBEC
40.0000 mg | DELAYED_RELEASE_TABLET | Freq: Every day | ORAL | Status: DC
  Administered 2012-08-18 – 2012-08-23 (×6): 40 mg via ORAL
  Filled 2012-08-18 (×6): qty 1

## 2012-08-18 MED ORDER — ENOXAPARIN SODIUM 40 MG/0.4ML ~~LOC~~ SOLN
40.0000 mg | SUBCUTANEOUS | Status: DC
  Administered 2012-08-18 – 2012-08-22 (×5): 40 mg via SUBCUTANEOUS
  Filled 2012-08-18 (×6): qty 0.4

## 2012-08-18 MED ORDER — HYDROMORPHONE HCL PF 1 MG/ML IJ SOLN
1.0000 mg | Freq: Once | INTRAMUSCULAR | Status: AC
  Administered 2012-08-18: 1 mg via INTRAVENOUS
  Filled 2012-08-18: qty 1

## 2012-08-18 MED ORDER — OSELTAMIVIR PHOSPHATE 75 MG PO CAPS
75.0000 mg | ORAL_CAPSULE | Freq: Once | ORAL | Status: AC
  Administered 2012-08-18: 75 mg via ORAL
  Filled 2012-08-18: qty 1

## 2012-08-18 MED ORDER — ONDANSETRON HCL 4 MG/2ML IJ SOLN
4.0000 mg | Freq: Once | INTRAMUSCULAR | Status: AC
  Administered 2012-08-18: 4 mg via INTRAVENOUS
  Filled 2012-08-18: qty 2

## 2012-08-18 MED ORDER — ALPRAZOLAM 0.25 MG PO TABS
0.2500 mg | ORAL_TABLET | Freq: Two times a day (BID) | ORAL | Status: DC
  Administered 2012-08-18 – 2012-08-19 (×2): 0.25 mg via ORAL
  Filled 2012-08-18 (×2): qty 1

## 2012-08-18 MED ORDER — TRAZODONE HCL 50 MG PO TABS
50.0000 mg | ORAL_TABLET | Freq: Every day | ORAL | Status: DC
  Filled 2012-08-18: qty 1

## 2012-08-18 MED ORDER — OXYCODONE-ACETAMINOPHEN 5-325 MG PO TABS
1.0000 | ORAL_TABLET | Freq: Four times a day (QID) | ORAL | Status: DC | PRN
  Administered 2012-08-18: 2 via ORAL
  Filled 2012-08-18: qty 2

## 2012-08-18 MED ORDER — SODIUM CHLORIDE 0.9 % IV SOLN: 250.0000 mL | INTRAVENOUS | Status: DC | PRN

## 2012-08-18 MED ORDER — HYDROCODONE-ACETAMINOPHEN 5-325 MG PO TABS
2.0000 | ORAL_TABLET | ORAL | Status: DC | PRN
  Administered 2012-08-18: 2 via ORAL
  Filled 2012-08-18 (×2): qty 2

## 2012-08-18 MED ORDER — PNEUMOCOCCAL VAC POLYVALENT 25 MCG/0.5ML IJ INJ
0.5000 mL | INJECTION | INTRAMUSCULAR | Status: AC
  Administered 2012-08-19: 0.5 mL via INTRAMUSCULAR
  Filled 2012-08-18 (×2): qty 0.5

## 2012-08-18 MED ORDER — MORPHINE SULFATE 4 MG/ML IJ SOLN
4.0000 mg | Freq: Once | INTRAMUSCULAR | Status: AC
  Administered 2012-08-18: 4 mg via INTRAVENOUS
  Filled 2012-08-18: qty 1

## 2012-08-18 MED ORDER — SODIUM CHLORIDE 0.9 % IJ SOLN: 3.0000 mL | INTRAMUSCULAR | Status: DC | PRN

## 2012-08-18 MED ORDER — OSELTAMIVIR PHOSPHATE 75 MG PO CAPS
75.0000 mg | ORAL_CAPSULE | Freq: Two times a day (BID) | ORAL | Status: DC
  Filled 2012-08-18: qty 1

## 2012-08-18 MED ORDER — HYDROMORPHONE HCL PF 1 MG/ML IJ SOLN
1.0000 mg | INTRAMUSCULAR | Status: DC | PRN
  Administered 2012-08-18: 1 mg via INTRAVENOUS
  Filled 2012-08-18: qty 1

## 2012-08-18 MED ORDER — SODIUM CHLORIDE 0.9 % IJ SOLN
3.0000 mL | Freq: Two times a day (BID) | INTRAMUSCULAR | Status: DC
  Administered 2012-08-19 – 2012-08-22 (×2): 3 mL via INTRAVENOUS

## 2012-08-18 MED ORDER — ZOLPIDEM TARTRATE 5 MG PO TABS
5.0000 mg | ORAL_TABLET | Freq: Every evening | ORAL | Status: DC | PRN
  Administered 2012-08-18 – 2012-08-22 (×5): 5 mg via ORAL
  Filled 2012-08-18 (×5): qty 1

## 2012-08-18 MED ORDER — LEVOFLOXACIN IN D5W 500 MG/100ML IV SOLN
500.0000 mg | INTRAVENOUS | Status: DC
  Administered 2012-08-18: 500 mg via INTRAVENOUS
  Filled 2012-08-18 (×2): qty 100

## 2012-08-18 NOTE — ED Notes (Signed)
Pt c/o cough and chest congestion x2days with center chest pain and aching all over.

## 2012-08-18 NOTE — ED Notes (Signed)
Patient transported to X-ray 

## 2012-08-18 NOTE — H&P (Signed)
[facility] History and Physical  Brooke [name] QMV:[id number] DOB: 06-11-1955 DOA: 08/18/2012  Referring physician: Dr. Dione Booze PCP: Quitman Livings, MD   Chief Complaint: Dyspnea, cough, wheezing.   History of Present Illness: Brooke [name] is an 57 y.o. female with a PMH of COPD and tobacco abuse who presents with a 2 week history of progressive dyspnea associated with wheezing, cough, dizziness and pleuritic chest pain exacerbated by bouts of coughing.  Cough is productive of pink tinged sputum, but mostly is non-productive.  Also reports some epistaxis.  She has not had a flu vaccination, but denies frank fever although she has had some chills. She does have some myalgias in the setting of chronic arthralgias and use of chronic opiates for pain control.  Her dyspnea is exacerbated by activity, and has gotten progressively worse with no alleviating factors. Upon initial evaluation emergency department, the patient was found to have diffuse bronchospasm. She did not respond to 2 bronchodilator therapy treatments and an initial dose of methylprednisolone and therefore was referred for inpatient treatment.  Review of Systems: Constitutional: No fever, + chills;  Appetite normal; No weight loss, no weight gain.  HEENT: No blurry vision, no diplopia, + pharyngitis, no dysphagia CV: + Pleuritic type chest pain, no palpitations.  Resp: +SOB and cough. GI: + nausea, no vomiting, + diarrhea, no melena, no hematochezia.  GU: No dysuria, no hematuria.  MSK: + myalgias and chronic arthralgias.  Neuro:  + headache, no focal neurological deficits, no history of seizures.  Psych: + depression and anxiety.  Endo: No thyroid disease, no DM, no heat intolerance, no cold intolerance, no polyuria, no polydipsia  Skin: No rashes, no skin lesions.  Heme: No easy bruising, no history of blood diseases.  Past Medical History Past Medical History  Diagnosis Date  . Hypertension   . Asthma   . COPD (chronic  obstructive pulmonary disease)   . Anxiety   . Cancer   . Colon cancer   . Dyslipidemia   . Migraine headache   . Chronic bronchitis   . Uterine fibroid   . Ovarian cyst   . GERD (gastroesophageal reflux disease)   . Morbid obesity   . Obstructive sleep apnea     mild  . Chronic pain   . Chronic back pain   . Arthritis   . Osteoarthritis   . Depression      Past Surgical History Past Surgical History  Procedure Laterality Date  . Knee arthroscopy      bil.  . Carpal tunnel release      rt  . Mouth surgery      teeth extraction  . Cesarean section    . Subtotal colectomy    . Colonoscopy    . Tubal ligation       Social History: History   Social History  . Marital Status: Widowed    Spouse Name: N/A    Number of Children: 2  . Years of Education: N/A   Occupational History  . disabled    Social History Main Topics  . Smoking status: Current Some Day Smoker -- 1.00 packs/day for 30 years    Types: Cigarettes  . Smokeless tobacco: Never Used     Comment: tobacco info given 07/21/2011  . Alcohol Use: No  . Drug Use: No  . Sexually Active: Yes   Other Topics Concern  . Not on file   Social History Narrative   Lives alone.  Widowed but has a female companion  who helps out.  Ambulates with a cane.  Drinks coffee daily.             Family History:  Family History  Problem Relation Age of Onset  . Uterine cancer Mother   . Stroke Father   . Colon cancer Maternal Uncle   . Diabetes Father   . Ovarian cancer Mother   . Hypertension Father   . Breast cancer Maternal Grandmother   . Diabetes Sister   . Asthma Child   . Asthma Child     Allergies: Aspirin and Kiwi extract  Meds: Prior to Admission medications   Medication Sig Start Date End Date Taking? Authorizing Provider  albuterol (PROVENTIL HFA;VENTOLIN HFA) 108 (90 BASE) MCG/ACT inhaler Inhale 2 puffs into the lungs every 6 (six) hours as needed. For shortness of breeath   Yes Historical  Provider, MD  ALPRAZolam (XANAX) 0.25 MG tablet Take 0.25 mg by mouth 2 (two) times daily. anxiety   Yes Historical Provider, MD  hydrochlorothiazide (HYDRODIURIL) 25 MG tablet Take 25 mg by mouth daily.    Yes Historical Provider, MD  HYDROcodone-acetaminophen (NORCO/VICODIN) 5-325 MG per tablet Take 2 tablets by mouth every 4 (four) hours as needed for pain. 05/12/12  Yes Roxy Horseman, PA-C  ibuprofen (ADVIL,MOTRIN) 200 MG tablet Take 400 mg by mouth every 8 (eight) hours as needed. For pain.   Yes Historical Provider, MD  omeprazole (PRILOSEC) 40 MG capsule Take 40 mg by mouth daily.    Yes Historical Provider, MD  oxyCODONE-acetaminophen (PERCOCET/ROXICET) 5-325 MG per tablet Take 1-2 tablets by mouth every 6 (six) hours as needed for pain. 04/15/12  Yes Ethelda Chick, MD  predniSONE (DELTASONE) 50 MG tablet Take 1 tablet (50 mg total) by mouth daily. 04/15/12  Yes Ethelda Chick, MD  traZODone (DESYREL) 50 MG tablet Take 50 mg by mouth at bedtime.  06/12/11  Yes Historical Provider, MD    Physical Exam: Filed Vitals:   08/18/12 1230 08/18/12 1300 08/18/12 1330 08/18/12 1400  BP: 128/82 132/102 102/48 92/54  Pulse: 98 100 90 87  Temp:      Resp: 22 17 21 16   SpO2: 100% 95% 90% 92%     Physical Exam: Blood pressure 92/54, pulse 87, temperature 97.8 F (36.6 C), resp. rate 16, last menstrual period 05/24/2003, SpO2 92.00%. Gen: Mild respiratory distress with prominent wheezing. Head: Normocephalic, atraumatic. Eyes: PERRL, EOMI, sclerae nonicteric. Mouth: Oropharynx clear with no tonsillar exudates. Tongue ring present. Neck: Supple, no thyromegaly, no lymphadenopathy, no jugular venous distention. Chest: Lungs with expiratory wheezes and poor air movement. CV: Heart sounds sounds regul no murmurs, rubs, or gallops.ar with Abdomen: Soft, nontender, nondistended with normal active bowel sounds. Extremities: ExtremitiesWith trace edema bilaterally.  Skin: Warm and dry. Neuro:  Alert and oriented times 3; cranial nerves II through XII grossly intact. Psych: Mood and affect normal.  Labs on Admission:  Basic Metabolic Panel:  Recent Labs Lab 08/18/12 1000  NA 140  K 3.5  CL 104  CO2 27  GLUCOSE 102*  BUN 5*  CREATININE 0.65  CALCIUM 8.8   CBC:  Recent Labs Lab 08/18/12 1000  WBC 5.1  NEUTROABS 2.4  HGB 12.8  HCT 39.7  MCV 97.5  PLT 166    BNP (last 3 results)  Recent Labs  10/24/11 0500  PROBNP 226.9*    Radiological Exams on Admission: Dg Chest 2 View  08/18/2012  *RADIOLOGY REPORT*  Clinical Data: Chest pain and weakness.  CHEST -  2 VIEW  Comparison: 04/15/2012.  Findings: Trachea is midline.  Heart size is accentuated by AP semi upright technique.  Mild central interstitial prominence without overt edema.  No pleural fluid.  IMPRESSION: No acute findings.   Original Report Authenticated By: Leanna Battles, M.D.     EKG: Independently reviewed. Sinus tachycardia at 107 beats per minute with PVCs present.   Assessment/Plan Principal Problem:   COPD with acute exacerbation -Patient will be treated with nebulized bronchodilator therapy, IV Solu-Medrol, codeine/guaifenesin for cough, and supplemental oxygen. -Influenza studies pending but empirically on Tamiflu and Levaquin as she is at high-risk for influenza related pneumonia. Active Problems:   Hypertension -Continue hydrochlorothiazide for blood pressure control.   Esophageal reflux -Continue PPI therapy.   Tobacco abuse -Counseled on tobacco cessation. Nicotine patch ordered.   Anxiety Continue Xanax.  Code Status: Full. Family Communication: Daughter Lorrine Kin [phone] Disposition Plan: Home when stable.  Time spent: 1 hour.  RAMA,CHRISTINA [facility] Pager [phone]  If 7PM-7AM, please contact night-coverage [email or web address] Password [facility] 08/18/2012, 3:55 PM

## 2012-08-18 NOTE — ED Provider Notes (Signed)
History     CSN: [id number]  Arrival date & time 08/18/12  4098   First MD Initiated Contact with Patient 08/18/12 [phone]      Chief Complaint  Patient presents with  . Cough  . Chest Pain    (Consider location/radiation/quality/duration/timing/severity/associated sxs/prior treatment) Patient is a 57 y.o. female presenting with cough and chest pain. The history is provided by the patient.  Cough Associated symptoms: chest pain   Chest Pain Associated symptoms: cough   She had onset 2 days ago of dyspnea and a cough productive of a small amount of pink tinged sputum. There is associated with chills and sweats but no fever. She's also had a sharp anterior and posterior chest pain which is worse with breathing. Pain is severe and she rates at 10/10. She has been using her albuterol inhaler with no relief. She has a history of COPD and has been admitted to the hospital in past for similar problems. She rates her symptoms as severe.  Past Medical History  Diagnosis Date  . Hypertension   . Asthma   . COPD (chronic obstructive pulmonary disease)   . Anxiety   . Cancer   . Colon cancer   . Dyslipidemia   . Migraine headache   . Chronic bronchitis   . Uterine fibroid   . Ovarian cyst   . GERD (gastroesophageal reflux disease)   . Morbid obesity   . Obstructive sleep apnea     mild  . Chronic pain   . Chronic back pain   . Arthritis   . Osteoarthritis   . Depression     Past Surgical History  Procedure Laterality Date  . Knee arthroscopy      bil.  . Carpal tunnel release      rt  . Mouth surgery      teeth extraction  . Cesarean section    . Subtotal colectomy    . Colonoscopy    . Tubal ligation      Family History  Problem Relation Age of Onset  . Uterine cancer Mother   . Stroke Father   . Colon cancer Maternal Uncle   . Diabetes Father   . Ovarian cancer Mother   . Hypertension Father   . Breast cancer Maternal Grandmother   . Diabetes Sister   .  Asthma Child   . Asthma Child     History  Substance Use Topics  . Smoking status: Current Some Day Smoker -- 1.00 packs/day for 30 years    Types: Cigarettes  . Smokeless tobacco: Never Used     Comment: tobacco info given 07/21/2011  . Alcohol Use: No    OB History   Grav Para Term Preterm Abortions TAB SAB Ect Mult Living   2 2 2       2       Review of Systems  Respiratory: Positive for cough.   Cardiovascular: Positive for chest pain.  All other systems reviewed and are negative.    Allergies  Aspirin and Kiwi extract  Home Medications   Current Outpatient Rx  Name  Route  Sig  Dispense  Refill  . albuterol (PROVENTIL HFA;VENTOLIN HFA) 108 (90 BASE) MCG/ACT inhaler   Inhalation   Inhale 2 puffs into the lungs every 6 (six) hours as needed. For shortness of breeath         . ALPRAZolam (XANAX) 0.25 MG tablet   Oral   Take 0.25 mg by mouth 2 (two) times daily.  anxiety         . hydrochlorothiazide (HYDRODIURIL) 25 MG tablet   Oral   Take 25 mg by mouth daily.          Marland Kitchen HYDROcodone-acetaminophen (NORCO/VICODIN) 5-325 MG per tablet   Oral   Take 2 tablets by mouth every 4 (four) hours as needed for pain.   15 tablet   0   . ibuprofen (ADVIL,MOTRIN) 200 MG tablet   Oral   Take 400 mg by mouth every 8 (eight) hours as needed. For pain.         Marland Kitchen moxifloxacin (AVELOX) 400 MG tablet   Oral   Take 1 tablet (400 mg total) by mouth daily.   10 tablet   0   . omeprazole (PRILOSEC) 40 MG capsule   Oral   Take 40 mg by mouth daily.          Marland Kitchen oxyCODONE-acetaminophen (PERCOCET/ROXICET) 5-325 MG per tablet   Oral   Take 1-2 tablets by mouth every 6 (six) hours as needed for pain.   15 tablet   0   . predniSONE (DELTASONE) 50 MG tablet   Oral   Take 1 tablet (50 mg total) by mouth daily.   4 tablet   0   . traZODone (DESYREL) 50 MG tablet   Oral   Take 50 mg by mouth at bedtime.            BP 94/54  Pulse 55  Temp(Src) 97.8 F (36.6  C)  SpO2 100%  LMP 05/24/2003  Physical Exam  Nursing note and vitals reviewed.  57 year old female, who appears dyspneic but is not using accessory muscles of respiration. Vital signs are significant for bradycardia with heart rate of 55. Oxygen saturation is 100%, which is normal. Head is normocephalic and atraumatic. PERRLA, EOMI. Oropharynx is clear. Neck is nontender and supple without adenopathy or JVD. Back is nontender and there is no CVA tenderness. Lungs have diffuse wheezes without rales or rhonchi. Chest is nontender. Heart has regular rate and rhythm without murmur. Abdomen is soft, flat, nontender without masses or hepatosplenomegaly and peristalsis is normoactive. Extremities have trace edema, full range of motion is present. Skin is warm and dry without rash. Neurologic: Mental status is normal, cranial nerves are intact, there are no motor or sensory deficits.  ED Course  Procedures (including critical care time)  Results for orders placed during the hospital encounter of 08/18/12  CBC WITH DIFFERENTIAL      Result Value Range   WBC 5.1  4.0 - 10.5 K/uL   RBC 4.07  3.87 - 5.11 MIL/uL   Hemoglobin 12.8  12.0 - 15.0 g/dL   HCT 16.1  09.6 - 04.5 %   MCV 97.5  78.0 - 100.0 fL   MCH 31.4  26.0 - 34.0 pg   MCHC 32.2  30.0 - 36.0 g/dL   RDW 40.9  81.1 - 91.4 %   Platelets 166  150 - 400 K/uL   Neutrophils Relative 47  43 - 77 %   Neutro Abs 2.4  1.7 - 7.7 K/uL   Lymphocytes Relative 38  12 - 46 %   Lymphs Abs 1.9  0.7 - 4.0 K/uL   Monocytes Relative 14 (*) 3 - 12 %   Monocytes Absolute 0.7  0.1 - 1.0 K/uL   Eosinophils Relative 1  0 - 5 %   Eosinophils Absolute 0.1  0.0 - 0.7 K/uL   Basophils Relative 0  0 - 1 %   Basophils Absolute 0.0  0.0 - 0.1 K/uL  BASIC METABOLIC PANEL      Result Value Range   Sodium 140  135 - 145 mEq/L   Potassium 3.5  3.5 - 5.1 mEq/L   Chloride 104  96 - 112 mEq/L   CO2 27  19 - 32 mEq/L   Glucose, Bld 102 (*) 70 - 99 mg/dL    BUN 5 (*) 6 - 23 mg/dL   Creatinine, Ser 1.61  0.50 - 1.10 mg/dL   Calcium 8.8  8.4 - 09.6 mg/dL   GFR calc non Af Amer >90  >90 mL/min   GFR calc Af Amer >90  >90 mL/min   Dg Chest 2 View  08/18/2012  *RADIOLOGY REPORT*  Clinical Data: Chest pain and weakness.  CHEST - 2 VIEW  Comparison: 04/15/2012.  Findings: Trachea is midline.  Heart size is accentuated by AP semi upright technique.  Mild central interstitial prominence without overt edema.  No pleural fluid.  IMPRESSION: No acute findings.   Original Report Authenticated By: Leanna Battles, M.D.       Date: 08/18/2012  Rate: 107  Rhythm: sinus tachycardia and premature ventricular contractions (PVC)  QRS Axis: normal  Intervals: normal  ST/T Wave abnormalities: normal  Conduction Disutrbances:none  Narrative Interpretation: Sinus tachycardia with frequent PVCs, left atrial hypertrophy. When compared with ECG of 10/15/2011, no significant changes are seen.  Old EKG Reviewed: unchanged    1. COPD exacerbation   2. Chest pain       MDM  COPD exacerbation with symptoms of acute bronchitis. Chills and sweats and myalgias suggest possibility of influenza. Patient states that she did not get her influenza immunization this season. She is right on the cusp of the treatment window for antiviral treatment and giving her underlying COPD, she is empirically started on oseltamivir. She received one albuterol nebulizer treatment with little relief. She'll be given a second treatment and also an initial dose of methylprednisolone. Old records are reviewed and she has several ED visits and several hospitalizations for similar episodes.  11:26 AM Reevaluation: She got little relief a second albuterol nebulizer treatment will be given a third treatment.  12:45 PM Reevaluation: There is no further improvement with third albuterol with Atrovent nebulizer treatment. She'll need to be admitted. Case is discussed with Dr. Darnelle Catalan of [facility] who agrees to admit the patient. Also, of note, she did not get relief of chest pain the 2 doses of morphine and is given a dose of hydromorphone.  Dione Booze, MD 08/18/12 1331

## 2012-08-19 DIAGNOSIS — G4700 Insomnia, unspecified: Secondary | ICD-10-CM | POA: Diagnosis present

## 2012-08-19 DIAGNOSIS — R739 Hyperglycemia, unspecified: Secondary | ICD-10-CM | POA: Diagnosis present

## 2012-08-19 DIAGNOSIS — G894 Chronic pain syndrome: Secondary | ICD-10-CM | POA: Diagnosis present

## 2012-08-19 LAB — COMPREHENSIVE METABOLIC PANEL
ALT: 14 U/L (ref 0–35)
Alkaline Phosphatase: 67 U/L (ref 39–117)
CO2: 26 mEq/L (ref 19–32)
Chloride: 100 mEq/L (ref 96–112)
GFR calc Af Amer: >90 mL/min (ref >90)
GFR calc non Af Amer: >90 mL/min (ref >90)
Glucose, Bld: 154 mg/dL — ABNORMAL HIGH (ref 70–99)
Potassium: 3.4 mEq/L — ABNORMAL LOW (ref 3.5–5.1)
Sodium: 136 mEq/L (ref 135–145)

## 2012-08-19 LAB — CBC
MCHC: 31.7 g/dL (ref 30.0–36.0)
RDW: 14 % (ref 11.5–15.5)

## 2012-08-19 MED ORDER — MORPHINE SULFATE 2 MG/ML IJ SOLN
2.0000 mg | Freq: Once | INTRAMUSCULAR | Status: AC
  Administered 2012-08-19: 2 mg via INTRAVENOUS
  Filled 2012-08-19: qty 1

## 2012-08-19 MED ORDER — OXYCODONE-ACETAMINOPHEN 5-325 MG PO TABS
1.0000 | ORAL_TABLET | ORAL | Status: DC | PRN
  Administered 2012-08-19 – 2012-08-23 (×18): 2 via ORAL
  Filled 2012-08-19 (×19): qty 2

## 2012-08-19 MED ORDER — MONTELUKAST SODIUM 10 MG PO TABS
10.0000 mg | ORAL_TABLET | Freq: Every day | ORAL | Status: DC
  Administered 2012-08-19 – 2012-08-22 (×4): 10 mg via ORAL
  Filled 2012-08-19 (×5): qty 1

## 2012-08-19 MED ORDER — ALBUTEROL SULFATE (5 MG/ML) 0.5% IN NEBU: 2.5000 mg | INHALATION_SOLUTION | RESPIRATORY_TRACT | Status: DC | PRN

## 2012-08-19 MED ORDER — ALPRAZOLAM 1 MG PO TABS
1.0000 mg | ORAL_TABLET | Freq: Every day | ORAL | Status: DC
  Administered 2012-08-19 – 2012-08-22 (×4): 1 mg via ORAL
  Filled 2012-08-19 (×4): qty 1

## 2012-08-19 MED ORDER — POTASSIUM CHLORIDE CRYS ER 20 MEQ PO TBCR
20.0000 meq | EXTENDED_RELEASE_TABLET | Freq: Two times a day (BID) | ORAL | Status: AC
  Administered 2012-08-19 (×2): 20 meq via ORAL
  Filled 2012-08-19 (×2): qty 1

## 2012-08-19 MED ORDER — LORATADINE 10 MG PO TABS
10.0000 mg | ORAL_TABLET | Freq: Every day | ORAL | Status: DC
  Administered 2012-08-19 – 2012-08-23 (×5): 10 mg via ORAL
  Filled 2012-08-19 (×5): qty 1

## 2012-08-19 MED ORDER — CARBAMIDE PEROXIDE 6.5 % OT SOLN
5.0000 [drp] | Freq: Two times a day (BID) | OTIC | Status: DC
  Administered 2012-08-19 – 2012-08-23 (×8): 5 [drp] via OTIC
  Filled 2012-08-19: qty 15

## 2012-08-19 MED ORDER — ALPRAZOLAM 0.25 MG PO TABS
0.2500 mg | ORAL_TABLET | Freq: Every day | ORAL | Status: DC
  Administered 2012-08-20 – 2012-08-23 (×4): 0.25 mg via ORAL
  Filled 2012-08-19 (×4): qty 1

## 2012-08-19 NOTE — Progress Notes (Signed)
[facility] PROGRESS NOTE  Brooke [name] ZOX:[id number] DOB: 07/28/1955 DOA: 08/18/2012 PCP: Quitman Livings, MD  Brief narrative: Brooke [name] is an 57 y.o. female with a PMH of COPD and tobacco abuse who was admitted on 08/18/2012 with a COPD exacerbation. Influenza studies were negative.  Assessment/Plan: Principal Problem:  COPD with acute exacerbation  -Continue nebulized bronchodilator therapy, IV Solu-Medrol, codeine/guaifenesin for cough, and supplemental oxygen. Continue flutter valve. -Influenza studies negative. Tamiflu and empiric Levaquin subsequently discontinued. -Add Claritin and Singulair. Active Problems: Insomnia -Patient states that Ambien did not help last night. She is quite anxious so will try a higher dose of Xanax at bedtime to help with sleep. Her anxiety is likely being exacerbated by steroids. Hyperglycemia -Check hemoglobin A1c.  Hypokalemia -Will give 20 mEq of potassium orally twice a day x2 doses. Chronic pain -Patient is somewhat narcotic seeking. Would avoid IV narcotics.  Hypertension  -Continue hydrochlorothiazide for blood pressure control.  Esophageal reflux  -Continue PPI therapy.  Tobacco abuse  -Counseled on tobacco cessation. Nicotine patch ordered.  Anxiety  Continue Xanax.   Code Status: Full.  Family Communication: Daughter Lorrine Kin [phone]  Disposition Plan: Home when stable.   Medical Consultants:  None.  Other Consultants:  Respiratory therapy  Anti-infectives:  Tamiflu 08/18/2012---> 08/18/2012  Levaquin 08/18/2012---> 08/19/2012   HPI/Subjective: Brooke [name] is complaining of pleuritic-type chest pain and ongoing severe dyspnea with wheezing. She also is complaining of right ear congestion.  Objective: Filed Vitals:   08/18/12 2246 08/19/12 0513 08/19/12 0932 08/19/12 1347  BP: 128/79 145/83  123/48  Pulse: 88 76  83  Temp: 98.1 F (36.7 C) 97.9 F (36.6 C)  97.6 F (36.4 C)  TempSrc: Oral Oral     Resp: 16 20  18   Height:      Weight:      SpO2: 100% 97% 99% 100%    Intake/Output Summary (Last 24 hours) at 08/19/12 1507 Last data filed at 08/19/12 0900  Gross per 24 hour  Intake   1186 ml  Output      0 ml  Net   1186 ml    Exam: Gen:  NAD Cardiovascular:  RRR, No M/R/G Respiratory:  Lungs with diffuse expiratory wheezes and decreased air movement Gastrointestinal:  Abdomen soft, NT/ND, + BS Extremities:  Trace edema  Data Reviewed: Basic Metabolic Panel:  Recent Labs Lab 08/18/12 1000 08/19/12 0345  NA 140 136  K 3.5 3.4*  CL 104 100  CO2 27 26  GLUCOSE 102* 154*  BUN 5* 9  CREATININE 0.65 0.59  CALCIUM 8.8 8.3*   GFR Estimated Creatinine Clearance: 84.3 ml/min (by C-G formula based on Cr of 0.59). Liver Function Tests:  Recent Labs Lab 08/19/12 0345  AST 17  ALT 14  ALKPHOS 67  BILITOT 0.2*  PROT 7.0  ALBUMIN 3.0*    CBC:  Recent Labs Lab 08/18/12 1000 08/19/12 0345  WBC 5.1 4.7  NEUTROABS 2.4  --   HGB 12.8 11.3*  HCT 39.7 35.7*  MCV 97.5 96.5  PLT 166 158   BNP (last 3 results)  Recent Labs  10/24/11 0500  PROBNP 226.9*    Hgb A1c No results found for this basename: HGBA1C,  in the last 72 hours   Procedures and Diagnostic Studies: Dg Chest 2 View  08/18/2012  *RADIOLOGY REPORT*  Clinical Data: Chest pain and weakness.  CHEST - 2 VIEW  Comparison: 04/15/2012.  Findings: Trachea is midline.  Heart size is accentuated by AP semi  upright technique.  Mild central interstitial prominence without overt edema.  No pleural fluid.  IMPRESSION: No acute findings.   Original Report Authenticated By: Leanna Battles, M.D.     Scheduled Meds: . albuterol  2.5 mg Nebulization Q6H  . ALPRAZolam  0.25 mg Oral BID  . enoxaparin (LOVENOX) injection  40 mg Subcutaneous Q24H  . hydrochlorothiazide  25 mg Oral Daily  . ipratropium  0.5 mg Nebulization Q6H  . levofloxacin (LEVAQUIN) IV  500 mg Intravenous Q24H  . methylPREDNISolone  (SOLU-MEDROL) injection  60 mg Intravenous Q6H  . nicotine  21 mg Transdermal Daily  . pantoprazole  40 mg Oral Daily  . sodium chloride  3 mL Intravenous Q12H   Continuous Infusions:   Time spent: 35 minutes   LOS: 1 day   RAMA,CHRISTINA  [facility] Pager [phone].  If 8PM-8AM, please contact night-coverage at [email or web address], password [facility] 08/19/2012, 3:07 PM

## 2012-08-19 NOTE — Progress Notes (Signed)
Pt requested something for pain from RN, took vicodin to pts room which she had received on day shift for her pain.  Pt informed RN that she takes percocet for pain at home and the vicodin was not going to touch her pain.  RN called provider on call and got her pain medication switched.  Gave percocet to pt, upon reassessment pt stated that she had no relief and she believes her body is not responding to the pain medication anymore because she has been on it for so long.  Pt seems very anxious, moaning and informing RN of new pain in her right shoulder and arm that just began.  While having a conversation with patient she became easily distracted from her pain.  RN called provider on call to get something stronger for pain for pt since percocet gave no relief.  Merdis Delay, NP ordered 2mg  morphine IV once. I explained this to pt she verbalized understanding.  Per pt the morphine really didn't help her pain either and she requested her percocet again.  Will continue to monitor.

## 2012-08-20 DIAGNOSIS — R51 Headache: Secondary | ICD-10-CM | POA: Diagnosis present

## 2012-08-20 DIAGNOSIS — K5900 Constipation, unspecified: Secondary | ICD-10-CM | POA: Diagnosis present

## 2012-08-20 DIAGNOSIS — R519 Headache, unspecified: Secondary | ICD-10-CM | POA: Diagnosis present

## 2012-08-20 LAB — BASIC METABOLIC PANEL
BUN: 12 mg/dL (ref 6–23)
Calcium: 8.8 mg/dL (ref 8.4–10.5)
GFR calc Af Amer: >90 mL/min (ref >90)
GFR calc non Af Amer: >90 mL/min (ref >90)
Potassium: 3.4 mEq/L — ABNORMAL LOW (ref 3.5–5.1)
Sodium: 139 mEq/L (ref 135–145)

## 2012-08-20 MED ORDER — POTASSIUM CHLORIDE CRYS ER 20 MEQ PO TBCR
40.0000 meq | EXTENDED_RELEASE_TABLET | Freq: Once | ORAL | Status: AC
  Administered 2012-08-20: 40 meq via ORAL
  Filled 2012-08-20: qty 2

## 2012-08-20 MED ORDER — SUMATRIPTAN SUCCINATE 6 MG/0.5ML ~~LOC~~ SOLN
6.0000 mg | Freq: Once | SUBCUTANEOUS | Status: AC
  Administered 2012-08-20: 6 mg via SUBCUTANEOUS
  Filled 2012-08-20: qty 0.5

## 2012-08-20 MED ORDER — IPRATROPIUM BROMIDE 0.02 % IN SOLN
0.5000 mg | Freq: Four times a day (QID) | RESPIRATORY_TRACT | Status: DC
  Administered 2012-08-21 – 2012-08-23 (×9): 0.5 mg via RESPIRATORY_TRACT
  Filled 2012-08-20 (×9): qty 2.5

## 2012-08-20 MED ORDER — ALBUTEROL SULFATE (5 MG/ML) 0.5% IN NEBU
2.5000 mg | INHALATION_SOLUTION | Freq: Four times a day (QID) | RESPIRATORY_TRACT | Status: DC
  Administered 2012-08-21 – 2012-08-23 (×9): 2.5 mg via RESPIRATORY_TRACT
  Filled 2012-08-20 (×9): qty 0.5

## 2012-08-20 MED ORDER — IBUPROFEN 800 MG PO TABS
800.0000 mg | ORAL_TABLET | Freq: Three times a day (TID) | ORAL | Status: DC | PRN
  Administered 2012-08-21: 800 mg via ORAL
  Filled 2012-08-20: qty 1

## 2012-08-20 MED ORDER — POLYETHYLENE GLYCOL 3350 17 G PO PACK
17.0000 g | PACK | Freq: Every day | ORAL | Status: DC
  Administered 2012-08-20 – 2012-08-22 (×3): 17 g via ORAL
  Filled 2012-08-20 (×4): qty 1

## 2012-08-20 MED ORDER — POTASSIUM CHLORIDE CRYS ER 20 MEQ PO TBCR
20.0000 meq | EXTENDED_RELEASE_TABLET | Freq: Two times a day (BID) | ORAL | Status: DC
  Administered 2012-08-20 – 2012-08-21 (×2): 20 meq via ORAL
  Filled 2012-08-20 (×3): qty 1

## 2012-08-20 NOTE — Progress Notes (Signed)
[facility] PROGRESS NOTE  Ishanvi Mcquitty EAV:[id number] DOB: 02-01-1956 DOA: 08/18/2012 PCP: Quitman Livings, MD  Brief narrative: Rogue Pautler is an 57 y.o. female with a PMH of COPD and tobacco abuse who was admitted on 08/18/2012 with a COPD exacerbation. Influenza studies were negative.  Assessment/Plan: Principal Problem:  COPD with acute exacerbation  -Continue nebulized bronchodilator therapy, IV Solu-Medrol, codeine/guaifenesin for cough, and supplemental oxygen. Continue flutter valve. -Influenza studies negative. Tamiflu and empiric Levaquin subsequently discontinued. -Continue Claritin and Singulair. Active Problems: Constipation -Start daily MiraLAX. Likely opiate induced. Headache -Give a dose of Imitrex. Increase Motrin dose to 800 mg 3 times a day when necessary. Insomnia -Responded to a 1 mg dose of Xanax each bedtime. Ambien and trazodone have not worked. Hyperglycemia -Followup hemoglobin A1c.  Hypokalemia -Continue to replace with oral therapy. Check magnesium in the morning. Chronic pain -Patient is somewhat narcotic seeking. Would avoid IV narcotics.  Hypertension  -Continue hydrochlorothiazide for blood pressure control.  Esophageal reflux  -Continue PPI therapy.  Tobacco abuse  -Counseled on tobacco cessation. Nicotine patch ordered.  Anxiety  Continue Xanax.   Code Status: Full.  Family Communication: Daughter Lorrine Kin [phone] his emergency contact; no family at bedside today. Disposition Plan: Home when stable.   Medical Consultants:  None.  Other Consultants:  Respiratory therapy  Anti-infectives:  Tamiflu 08/18/2012---> 08/18/2012  Levaquin 08/18/2012---> 08/19/2012   HPI/Subjective: Mckinzie Saksa is complaining of a persistent headache despite ibuprofen and Percocet. She is also constipated. She continues to complain of shortness of breath with wheezing.  Objective: Filed Vitals:   08/19/12 1347 08/19/12 2119 08/20/12 0614  08/20/12 0806  BP: 123/48 126/67 152/90   Pulse: 83 78 76   Temp: 97.6 F (36.4 C) 97.7 F (36.5 C) 97.5 F (36.4 C)   TempSrc:  Oral Oral   Resp: 18 18 16    Height:      Weight:      SpO2: 100% 100% 100% 99%    Intake/Output Summary (Last 24 hours) at 08/20/12 1300 Last data filed at 08/20/12 0549  Gross per 24 hour  Intake 724.67 ml  Output      0 ml  Net 724.67 ml    Exam: Gen:  NAD Cardiovascular:  RRR, No M/R/G Respiratory:  Lungs with diffuse expiratory wheezes and decreased air movement Gastrointestinal:  Abdomen soft, NT/ND, + BS Extremities:  Trace edema  Data Reviewed: Basic Metabolic Panel:  Recent Labs Lab 08/18/12 1000 08/19/12 0345 08/20/12 0528  NA 140 136 139  K 3.5 3.4* 3.4*  CL 104 100 100  CO2 27 26 30   GLUCOSE 102* 154* 157*  BUN 5* 9 12  CREATININE 0.65 0.59 0.65  CALCIUM 8.8 8.3* 8.8   GFR Estimated Creatinine Clearance: 84.3 ml/min (by C-G formula based on Cr of 0.65). Liver Function Tests:  Recent Labs Lab 08/19/12 0345  AST 17  ALT 14  ALKPHOS 67  BILITOT 0.2*  PROT 7.0  ALBUMIN 3.0*    CBC:  Recent Labs Lab 08/18/12 1000 08/19/12 0345  WBC 5.1 4.7  NEUTROABS 2.4  --   HGB 12.8 11.3*  HCT 39.7 35.7*  MCV 97.5 96.5  PLT 166 158   BNP (last 3 results)  Recent Labs  10/24/11 0500  PROBNP 226.9*    Hgb A1c No results found for this basename: HGBA1C,  in the last 72 hours   Procedures and Diagnostic Studies: Dg Chest 2 View  08/18/2012  *RADIOLOGY REPORT*  Clinical Data: Chest pain and  weakness.  CHEST - 2 VIEW  Comparison: 04/15/2012.  Findings: Trachea is midline.  Heart size is accentuated by AP semi upright technique.  Mild central interstitial prominence without overt edema.  No pleural fluid.  IMPRESSION: No acute findings.   Original Report Authenticated By: Leanna Battles, M.D.     Scheduled Meds: . albuterol  2.5 mg Nebulization Q6H  . ALPRAZolam  0.25 mg Oral Daily  . ALPRAZolam  1 mg Oral QHS   . carbamide peroxide  5 drop Right Ear BID  . enoxaparin (LOVENOX) injection  40 mg Subcutaneous Q24H  . hydrochlorothiazide  25 mg Oral Daily  . ipratropium  0.5 mg Nebulization Q6H  . loratadine  10 mg Oral Daily  . methylPREDNISolone (SOLU-MEDROL) injection  60 mg Intravenous Q6H  . montelukast  10 mg Oral QHS  . nicotine  21 mg Transdermal Daily  . pantoprazole  40 mg Oral Daily  . sodium chloride  3 mL Intravenous Q12H   Continuous Infusions:   Time spent: 25 minutes   LOS: 2 days   RAMA,CHRISTINA  [facility] Pager [phone].  If 8PM-8AM, please contact night-coverage at [email or web address], password [facility] 08/20/2012, 1:00 PM

## 2012-08-20 NOTE — Progress Notes (Signed)
Chart review complete.  Patient is not eligible for [facility] Care Management services because her PCP is not a [facility] primary care provider or is not [facility] affiliated.  For any additional questions or new referrals please contact Tim Henderson BSN RN [facility] Liaison at [phone] °

## 2012-08-20 NOTE — Progress Notes (Signed)
Pt reported that Imitrex relieved her headache. Pt requested an additional dose, this RN informed that order was for a one time dose.

## 2012-08-20 NOTE — Progress Notes (Signed)
Patient voiced many complaints in range from Heat/air not working to not being happy with her nursing care to having no relief from her headache. Nebulizer therapy done. Patient c/o nebulizer being the culprit of recurrent headaches. Education provided from RT standpoint. RT protocol assessment completed. Orders changed accordingly. RN aware of patient complaints.

## 2012-08-21 LAB — MAGNESIUM: Magnesium: 2.1 mg/dL (ref 1.5–2.5)

## 2012-08-21 MED ORDER — POTASSIUM CHLORIDE CRYS ER 20 MEQ PO TBCR
20.0000 meq | EXTENDED_RELEASE_TABLET | Freq: Three times a day (TID) | ORAL | Status: DC
  Administered 2012-08-21 – 2012-08-23 (×6): 20 meq via ORAL
  Filled 2012-08-21 (×8): qty 1

## 2012-08-21 MED ORDER — VITAMINS A & D EX OINT
TOPICAL_OINTMENT | CUTANEOUS | Status: AC
  Administered 2012-08-21: 12:00:00
  Filled 2012-08-21: qty 5

## 2012-08-21 MED ORDER — KETOROLAC TROMETHAMINE 30 MG/ML IJ SOLN
30.0000 mg | Freq: Once | INTRAMUSCULAR | Status: AC
  Administered 2012-08-22: 30 mg via INTRAVENOUS
  Filled 2012-08-21: qty 1

## 2012-08-21 MED ORDER — SUMATRIPTAN SUCCINATE 6 MG/0.5ML ~~LOC~~ SOLN
6.0000 mg | SUBCUTANEOUS | Status: DC | PRN
  Administered 2012-08-21 – 2012-08-23 (×4): 6 mg via SUBCUTANEOUS
  Filled 2012-08-21 (×5): qty 0.5

## 2012-08-21 NOTE — Progress Notes (Signed)
[facility] PROGRESS NOTE  Brooke [name] ZOX:[id number] DOB: 1955/06/18 DOA: 08/18/2012 PCP: Quitman Livings, MD  Brief narrative: Brooke [name] is an 57 y.o. female with a PMH of COPD and tobacco abuse who was admitted on 08/18/2012 with a COPD exacerbation. Influenza studies were negative.  Assessment/Plan: Principal Problem:  COPD with acute exacerbation  -Continue nebulized bronchodilator therapy, IV Solu-Medrol, codeine/guaifenesin for cough, and supplemental oxygen. Continue flutter valve. -Influenza studies negative. Tamiflu and empiric Levaquin subsequently discontinued. -Continue Claritin and Singulair. Active Problems: Constipation -Continue daily MiraLAX. Likely opiate induced. Headache -Continue PRN Imitrex. Continue Motrin dose 800 mg 3 times a day when necessary. Insomnia -Responded to a 1 mg dose of Xanax each bedtime. Ambien and trazodone have not worked. Hyperglycemia -Followup hemoglobin A1c.  Hypokalemia -Continue to replace with oral therapy. Check magnesium in the morning. Chronic pain -Patient is somewhat narcotic seeking. Would avoid IV narcotics.  Hypertension  -Continue hydrochlorothiazide for blood pressure control.  Esophageal reflux  -Continue PPI therapy.  Tobacco abuse  -Counseled on tobacco cessation. Nicotine patch ordered.  Anxiety  Continue Xanax.   Code Status: Full.  Family Communication: Daughter Brooke [name] [phone] is emergency contact;  family present at bedside today. Disposition Plan: Home when stable.   Medical Consultants:  None.  Other Consultants:  Respiratory therapy  Anti-infectives:  Tamiflu 08/18/2012---> 08/18/2012  Levaquin 08/18/2012---> 08/19/2012   HPI/Subjective: Brooke [name] reports that her headache has improved with the Imitrex. Still dyspneic but less wheezy. Cough beginning to improve.  Objective: Filed Vitals:   08/20/12 1406 08/20/12 2242 08/21/12 0500 08/21/12 0738  BP: 161/87 138/85 143/91    Pulse: 80 82 79   Temp: 97.6 F (36.4 C) 97.8 F (36.6 C) 98.1 F (36.7 C)   TempSrc:  Oral Oral   Resp: 18 18 18    Height:      Weight:      SpO2: 100% 100% 99% 99%    Intake/Output Summary (Last 24 hours) at 08/21/12 1412 Last data filed at 08/21/12 0900  Gross per 24 hour  Intake    240 ml  Output      0 ml  Net    240 ml    Exam: Gen:  NAD Cardiovascular:  RRR, No M/R/G Respiratory:  Lungs with diffuse expiratory wheezes and decreased air movement Gastrointestinal:  Abdomen soft, NT/ND, + BS Extremities:  Trace edema  Data Reviewed: Basic Metabolic Panel:  Recent Labs Lab 08/18/12 1000 08/19/12 0345 08/20/12 0528 08/21/12 0425  NA 140 136 139  --   K 3.5 3.4* 3.4*  --   CL 104 100 100  --   CO2 27 26 30   --   GLUCOSE 102* 154* 157*  --   BUN 5* 9 12  --   CREATININE 0.65 0.59 0.65  --   CALCIUM 8.8 8.3* 8.8  --   MG  --   --   --  2.1   GFR Estimated Creatinine Clearance: 84.3 ml/min (by C-G formula based on Cr of 0.65). Liver Function Tests:  Recent Labs Lab 08/19/12 0345  AST 17  ALT 14  ALKPHOS 67  BILITOT 0.2*  PROT 7.0  ALBUMIN 3.0*    CBC:  Recent Labs Lab 08/18/12 1000 08/19/12 0345  WBC 5.1 4.7  NEUTROABS 2.4  --   HGB 12.8 11.3*  HCT 39.7 35.7*  MCV 97.5 96.5  PLT 166 158   BNP (last 3 results)  Recent Labs  10/24/11 0500  PROBNP 226.9*  Hgb A1c  Recent Labs  08/20/12 0528  HGBA1C 5.5     Procedures and Diagnostic Studies: Dg Chest 2 View  08/18/2012  *RADIOLOGY REPORT*  Clinical Data: Chest pain and weakness.  CHEST - 2 VIEW  Comparison: 04/15/2012.  Findings: Trachea is midline.  Heart size is accentuated by AP semi upright technique.  Mild central interstitial prominence without overt edema.  No pleural fluid.  IMPRESSION: No acute findings.   Original Report Authenticated By: Leanna Battles, M.D.     Scheduled Meds: . albuterol  2.5 mg Nebulization Q6H WA  . ALPRAZolam  0.25 mg Oral Daily  .  ALPRAZolam  1 mg Oral QHS  . carbamide peroxide  5 drop Right Ear BID  . enoxaparin (LOVENOX) injection  40 mg Subcutaneous Q24H  . hydrochlorothiazide  25 mg Oral Daily  . ipratropium  0.5 mg Nebulization Q6H WA  . loratadine  10 mg Oral Daily  . methylPREDNISolone (SOLU-MEDROL) injection  60 mg Intravenous Q6H  . montelukast  10 mg Oral QHS  . nicotine  21 mg Transdermal Daily  . pantoprazole  40 mg Oral Daily  . polyethylene glycol  17 g Oral Daily  . potassium chloride  20 mEq Oral BID  . sodium chloride  3 mL Intravenous Q12H   Continuous Infusions:   Time spent: 25 minutes   LOS: 3 days   Brooke [name]  [facility] Pager [phone].  If 8PM-8AM, please contact night-coverage at [email or web address], password [facility] 08/21/2012, 2:12 PM

## 2012-08-22 LAB — BASIC METABOLIC PANEL
BUN: 17 mg/dL (ref 6–23)
CO2: 32 mEq/L (ref 19–32)
Calcium: 8.6 mg/dL (ref 8.4–10.5)
Creatinine, Ser: 0.68 mg/dL (ref 0.50–1.10)

## 2012-08-22 MED ORDER — ALUM & MAG HYDROXIDE-SIMETH 200-200-20 MG/5ML PO SUSP
30.0000 mL | ORAL | Status: DC | PRN
  Administered 2012-08-22 – 2012-08-23 (×2): 30 mL via ORAL
  Filled 2012-08-22 (×2): qty 30

## 2012-08-22 MED ORDER — SERTRALINE HCL 50 MG PO TABS
50.0000 mg | ORAL_TABLET | Freq: Every day | ORAL | Status: DC
  Administered 2012-08-22 – 2012-08-23 (×2): 50 mg via ORAL
  Filled 2012-08-22 (×2): qty 1

## 2012-08-22 MED ORDER — BISACODYL 10 MG RE SUPP
10.0000 mg | Freq: Once | RECTAL | Status: AC
  Administered 2012-08-22: 10 mg via RECTAL
  Filled 2012-08-22: qty 1

## 2012-08-22 MED ORDER — METHYLPREDNISOLONE SODIUM SUCC 125 MG IJ SOLR
60.0000 mg | Freq: Two times a day (BID) | INTRAMUSCULAR | Status: DC
  Administered 2012-08-22 – 2012-08-23 (×2): 60 mg via INTRAVENOUS
  Filled 2012-08-22 (×4): qty 0.96

## 2012-08-22 NOTE — Progress Notes (Signed)
Patient ambulated twice around the loop of the unit.  Oxyen saturations were never less 98% on room air.  Philomena Doheny RN

## 2012-08-22 NOTE — Progress Notes (Signed)
[facility] PROGRESS NOTE  Brooke [name] ZOX:[id number] DOB: 02-04-1956 DOA: 08/18/2012 PCP: Quitman Livings, MD  Brief narrative: Brooke [name] is an 57 y.o. female with a PMH of COPD and tobacco abuse who was admitted on 08/18/2012 with a COPD exacerbation. Influenza studies were negative.  Assessment/Plan: Principal Problem:  COPD with acute exacerbation  -Continue nebulized bronchodilator therapy, begin to wean IV Solu-Medrol, continue codeine/guaifenesin for cough, and supplemental oxygen. Continue flutter valve. -Influenza studies negative. Tamiflu and empiric Levaquin subsequently discontinued. -Continue Claritin and Singulair. Active Problems: Constipation -Continue daily MiraLAX. We will give a Dulcolax suppository today since her bowels still have not moved despite MiraLAX. Likely opiate induced. Headache -Continue PRN Imitrex. Continue Motrin dose 800 mg 3 times a day when necessary. Insomnia -Responded to a 1 mg dose of Xanax each bedtime. Ambien and trazodone have not worked. Hyperglycemia -Followup hemoglobin A1c.  Hypokalemia -Continue to replace with oral therapy. Check magnesium in the morning. Chronic pain -Patient is somewhat narcotic seeking. Would avoid IV narcotics.  Hypertension  -Continue hydrochlorothiazide for blood pressure control.  Esophageal reflux  -Continue PPI therapy.  Tobacco abuse  -Counseled on tobacco cessation. Nicotine patch ordered.  Anxiety  Continue Xanax.   Code Status: Full.  Family Communication: Daughter Lorrine Kin [phone] is emergency contact;  family updated at bedside today. Disposition Plan: Home when stable.   Medical Consultants:  None.  Other Consultants:  Respiratory therapy  Anti-infectives:  Tamiflu 08/18/2012---> 08/18/2012  Levaquin 08/18/2012---> 08/19/2012   HPI/Subjective: Brooke [name] still has not moved her bowels. She still is a bit intolerant of activity, but she has walked in the halls a little  and is less dyspneic at rest. Cough has improved.  Objective: Filed Vitals:   08/21/12 2007 08/21/12 2212 08/22/12 0207 08/22/12 0558  BP:  130/98  155/93  Pulse:  85  60  Temp:  97.4 F (36.3 C)  97.3 F (36.3 C)  TempSrc:  Oral  Oral  Resp:  16  16  Height:      Weight:      SpO2: 100% 99% 99% 100%    Intake/Output Summary (Last 24 hours) at 08/22/12 0737 Last data filed at 08/21/12 2213  Gross per 24 hour  Intake    830 ml  Output      0 ml  Net    830 ml    Exam: Gen:  NAD Cardiovascular:  RRR, No M/R/G Respiratory:  Lungs with faint diffuse expiratory wheezes and improved air movement Gastrointestinal:  Abdomen soft, NT/ND, + BS Extremities:  Trace edema  Data Reviewed: Basic Metabolic Panel:  Recent Labs Lab 08/18/12 1000 08/19/12 0345 08/20/12 0528 08/21/12 0425 08/22/12 0412  NA 140 136 139  --  139  K 3.5 3.4* 3.4*  --  3.8  CL 104 100 100  --  101  CO2 27 26 30   --  32  GLUCOSE 102* 154* 157*  --  123*  BUN 5* 9 12  --  17  CREATININE 0.65 0.59 0.65  --  0.68  CALCIUM 8.8 8.3* 8.8  --  8.6  MG  --   --   --  2.1  --    GFR Estimated Creatinine Clearance: 84.3 ml/min (by C-G formula based on Cr of 0.68). Liver Function Tests:  Recent Labs Lab 08/19/12 0345  AST 17  ALT 14  ALKPHOS 67  BILITOT 0.2*  PROT 7.0  ALBUMIN 3.0*    CBC:  Recent Labs Lab 08/18/12 1000 08/19/12  0345  WBC 5.1 4.7  NEUTROABS 2.4  --   HGB 12.8 11.3*  HCT 39.7 35.7*  MCV 97.5 96.5  PLT 166 158   BNP (last 3 results)  Recent Labs  10/24/11 0500  PROBNP 226.9*    Hgb A1c  Recent Labs  08/20/12 0528  HGBA1C 5.5     Procedures and Diagnostic Studies: Dg Chest 2 View  08/18/2012  *RADIOLOGY REPORT*  Clinical Data: Chest pain and weakness.  CHEST - 2 VIEW  Comparison: 04/15/2012.  Findings: Trachea is midline.  Heart size is accentuated by AP semi upright technique.  Mild central interstitial prominence without overt edema.  No pleural fluid.   IMPRESSION: No acute findings.   Original Report Authenticated By: Leanna Battles, M.D.     Scheduled Meds: . albuterol  2.5 mg Nebulization Q6H WA  . ALPRAZolam  0.25 mg Oral Daily  . ALPRAZolam  1 mg Oral QHS  . carbamide peroxide  5 drop Right Ear BID  . enoxaparin (LOVENOX) injection  40 mg Subcutaneous Q24H  . hydrochlorothiazide  25 mg Oral Daily  . ipratropium  0.5 mg Nebulization Q6H WA  . loratadine  10 mg Oral Daily  . methylPREDNISolone (SOLU-MEDROL) injection  60 mg Intravenous Q6H  . montelukast  10 mg Oral QHS  . nicotine  21 mg Transdermal Daily  . pantoprazole  40 mg Oral Daily  . polyethylene glycol  17 g Oral Daily  . potassium chloride  20 mEq Oral TID  . sodium chloride  3 mL Intravenous Q12H   Continuous Infusions:   Time spent: 25 minutes   LOS: 4 days   RAMA,CHRISTINA  [facility] Pager [phone].  If 8PM-8AM, please contact night-coverage at [email or web address], password [facility] 08/22/2012, 7:37 AM

## 2012-08-23 MED ORDER — LORATADINE 10 MG PO TABS: 10.0000 mg | ORAL_TABLET | Freq: Every day | ORAL | Status: DC

## 2012-08-23 MED ORDER — SERTRALINE HCL 50 MG PO TABS: 50.0000 mg | ORAL_TABLET | Freq: Every day | ORAL | Status: DC

## 2012-08-23 MED ORDER — GUAIFENESIN-CODEINE 100-10 MG/5ML PO SOLN: 5.0000 mL | ORAL | Status: DC | PRN

## 2012-08-23 MED ORDER — SUMATRIPTAN SUCCINATE 100 MG PO TABS: 100.0000 mg | ORAL_TABLET | ORAL | Status: DC | PRN

## 2012-08-23 MED ORDER — MONTELUKAST SODIUM 10 MG PO TABS: 10.0000 mg | ORAL_TABLET | Freq: Every day | ORAL | Status: DC

## 2012-08-23 MED ORDER — PREDNISONE (PAK) 10 MG PO TABS: ORAL_TABLET | ORAL | Status: DC

## 2012-08-23 MED ORDER — POTASSIUM CHLORIDE CRYS ER 20 MEQ PO TBCR: 20.0000 meq | EXTENDED_RELEASE_TABLET | Freq: Two times a day (BID) | ORAL | Status: DC

## 2012-08-23 NOTE — Discharge Summary (Signed)
Physician Discharge Summary  Brooke [name] WUJ:[id number] DOB: [birth year] DOA: 08/18/2012  PCP: Quitman Livings, MD  Admit date: 08/18/2012 Discharge date: 08/23/2012  Recommendations for Outpatient Follow-up:  1. Followup with primary care doctor in one week to ensure continued resolution of asthma/COPD exacerbation. 2. Followup electrolytes including potassium, discharged home on potassium replacement therapy.  Discharge Diagnoses:  Principal Problem:    COPD with acute exacerbation Active Problems:    Hypertension    Esophageal reflux    Hypokalemia    Tobacco abuse    Anxiety    Chronic pain syndrome    Hyperglycemia    Insomnia    Unspecified constipation    Headache    Class III obesity   Discharge Condition: Improved.  Diet recommendation: Low-sodium, heart healthy.  History of present illness:  Brooke [name] is an 57 y.o. female with a PMH of COPD and tobacco abuse who was admitted on 08/18/2012 with a COPD exacerbation. Influenza studies were negative.  Hospital Course by problem:  Principal Problem:  COPD with acute exacerbation  -Admitted and treated with nebulized bronchodilator therapy, IV Solu-Medrol that was gradually tapered, and codeine/guaifenesin for cough. Continue flutter valve.  -Her oxygen saturations with ambulation did not drop down below 97-98% and therefore she does not qualify for home oxygen. -Influenza studies were checked and found to be negative.  -Discharge home on Claritin, a prednisone taper and Singulair.  Active Problems:  Opiate-induced Constipation  -Treated with daily MiraLAX. Given a Dulcolax suppository on 08/22/2012.  Headache  -Given a prescription for PRN Imitrex. Continue Motrin dose 800 mg 3 times a day when necessary.  Insomnia  -Responded to a 1 mg dose of Xanax each bedtime. Ambien and trazodone have not worked.  Hyperglycemia  -Hemoglobin A1c 5.5%. Hyperglycemia is steroid-induced and should improve with  discontinuation of steroids. Hypokalemia  -Replaced. Started on routine supplementation therapy as this is likely related to diuretics. Magnesium not low.  Chronic pain  -Patient was somewhat narcotic seeking. We avoided IV narcotics.  -At discharge, the patient requested refills on her oxycodone. Her pharmacy was called and confirmed that she had #120 tablets filled on 08/04/2012. -Patient was counseled that she needs to only have one prescriber of her opiates, and to followup with her primary care physician for a refill on 09/01/2012. Hypertension  -Continue hydrochlorothiazide for blood pressure control.  Esophageal reflux  -Continue PPI therapy.  Tobacco abuse  -Counseled on tobacco cessation. Nicotine patch provided.  Anxiety  Continue Xanax. Class III obesity -Recommend weight loss.   Procedures:  None.  Consultations:  Respiratory therapy  Discharge Exam: Filed Vitals:   08/23/12 0604  BP: 166/91  Pulse: 66  Temp: 97.4 F (36.3 C)  Resp: 20   Filed Vitals:   08/22/12 2253 08/23/12 0200 08/23/12 0604 08/23/12 0755  BP: 132/75  166/91   Pulse: 86  66   Temp: 97.6 F (36.4 C)  97.4 F (36.3 C)   TempSrc: Oral  Oral   Resp: 20  20   Height:      Weight:      SpO2: 100% 99% 100% 99%    Gen:  NAD Cardiovascular:  RRR, No M/R/G Respiratory: Lungs CTAB Gastrointestinal: Abdomen soft, NT/ND with normal active bowel sounds. Extremities: No C/E/C   Discharge Instructions      Discharge Orders   Future Orders Complete By Expires     Call MD for:  temperature >100.4  As directed     Call MD for:  As  directed     Scheduling Instructions:      Worsening breathing and wheezing, chest pain, or a cough productive of yellow or green sputum.    Diet - low sodium heart healthy  As directed     Discharge instructions  As directed     Comments:      You must have one prescriber for any controlled substances including pain medicines. Your doctor and your pharmacy  confirmed that you had #120 tablets of oxycodone filled on 08/04/2012. This cannot be refilled before 09/01/2012 (by your primary care doctor.)    Increase activity slowly  As directed         Medication List    STOP taking these medications       HYDROcodone-acetaminophen 5-325 MG per tablet  Commonly known as:  NORCO/VICODIN     predniSONE 50 MG tablet  Commonly known as:  DELTASONE      TAKE these medications       albuterol 108 (90 BASE) MCG/ACT inhaler  Commonly known as:  PROVENTIL HFA;VENTOLIN HFA  Inhale 2 puffs into the lungs every 6 (six) hours as needed. For shortness of breeath     ALPRAZolam 0.25 MG tablet  Commonly known as:  XANAX  Take 0.25 mg by mouth 2 (two) times daily. anxiety     guaiFENesin-codeine 100-10 MG/5ML syrup  Take 5 mLs by mouth every 4 (four) hours as needed for cough.     hydrochlorothiazide 25 MG tablet  Commonly known as:  HYDRODIURIL  Take 25 mg by mouth daily.     ibuprofen 200 MG tablet  Commonly known as:  ADVIL,MOTRIN  Take 400 mg by mouth every 8 (eight) hours as needed. For pain.     loratadine 10 MG tablet  Commonly known as:  CLARITIN  Take 1 tablet (10 mg total) by mouth daily.     montelukast 10 MG tablet  Commonly known as:  SINGULAIR  Take 1 tablet (10 mg total) by mouth at bedtime.     omeprazole 40 MG capsule  Commonly known as:  PRILOSEC  Take 40 mg by mouth daily.     oxyCODONE-acetaminophen 5-325 MG per tablet  Commonly known as:  PERCOCET/ROXICET  Take 1-2 tablets by mouth every 6 (six) hours as needed for pain.     potassium chloride SA 20 MEQ tablet  Commonly known as:  K-DUR,KLOR-CON  Take 1 tablet (20 mEq total) by mouth 2 (two) times daily.     predniSONE 10 MG tablet  Commonly known as:  STERAPRED UNI-PAK  Take 6 tablets on 08/24/2012 and decrease by 10 mg daily until all pills gone.     sertraline 50 MG tablet  Commonly known as:  ZOLOFT  Take 1 tablet (50 mg total) by mouth daily.      SUMAtriptan 100 MG tablet  Commonly known as:  IMITREX  Take 1 tablet (100 mg total) by mouth every 2 (two) hours as needed for migraine.     traZODone 50 MG tablet  Commonly known as:  DESYREL  Take 50 mg by mouth at bedtime.       Follow-up Information   Follow up with [facility], MD. Schedule an appointment as soon as possible for a visit in 1 week. [facility] followup)    Contact information:   [address] [place] [address] [phone]        The results of significant diagnostics from this hospitalization (including imaging, microbiology, ancillary and  laboratory) are listed below for reference.    Significant Diagnostic Studies: Dg Chest 2 View  08/18/2012  *RADIOLOGY REPORT*  Clinical Data: Chest pain and weakness.  CHEST - 2 VIEW  Comparison: 04/15/2012.  Findings: Trachea is midline.  Heart size is accentuated by AP semi upright technique.  Mild central interstitial prominence without overt edema.  No pleural fluid.  IMPRESSION: No acute findings.   Original Report Authenticated By: Leanna Battles, M.D.     Labs:  Basic Metabolic Panel:  Recent Labs Lab 08/18/12 1000 08/19/12 0345 08/20/12 0528 08/21/12 0425 08/22/12 0412  NA 140 136 139  --  139  K 3.5 3.4* 3.4*  --  3.8  CL 104 100 100  --  101  CO2 27 26 30   --  32  GLUCOSE 102* 154* 157*  --  123*  BUN 5* 9 12  --  17  CREATININE 0.65 0.59 0.65  --  0.68  CALCIUM 8.8 8.3* 8.8  --  8.6  MG  --   --   --  2.1  --    GFR Estimated Creatinine Clearance: 84.3 ml/min (by C-G formula based on Cr of 0.68). Liver Function Tests:  Recent Labs Lab 08/19/12 0345  AST 17  ALT 14  ALKPHOS 67  BILITOT 0.2*  PROT 7.0  ALBUMIN 3.0*   CBC:  Recent Labs Lab 08/18/12 1000 08/19/12 0345  WBC 5.1 4.7  NEUTROABS 2.4  --   HGB 12.8 11.3*  HCT 39.7 35.7*  MCV 97.5 96.5  PLT 166 158    Time coordinating discharge: 35 minutes.  Signed:  Willene Holian  Pager [phone] [facility] 08/23/2012, 9:15 AM

## 2012-09-02 DIAGNOSIS — E669 Obesity, unspecified: Secondary | ICD-10-CM | POA: Diagnosis not present

## 2012-09-02 DIAGNOSIS — M545 Low back pain: Secondary | ICD-10-CM | POA: Diagnosis not present

## 2012-09-02 DIAGNOSIS — M79609 Pain in unspecified limb: Secondary | ICD-10-CM | POA: Diagnosis not present

## 2012-09-02 DIAGNOSIS — I10 Essential (primary) hypertension: Secondary | ICD-10-CM | POA: Diagnosis not present

## 2012-09-06 ENCOUNTER — Other Ambulatory Visit: Payer: Self-pay | Admitting: Internal Medicine

## 2012-09-06 DIAGNOSIS — Z1231 Encounter for screening mammogram for malignant neoplasm of breast: Secondary | ICD-10-CM

## 2012-09-28 ENCOUNTER — Ambulatory Visit: Payer: Medicare Other

## 2012-11-16 DIAGNOSIS — M549 Dorsalgia, unspecified: Secondary | ICD-10-CM | POA: Diagnosis not present

## 2012-11-16 DIAGNOSIS — I10 Essential (primary) hypertension: Secondary | ICD-10-CM | POA: Diagnosis not present

## 2012-11-16 DIAGNOSIS — F411 Generalized anxiety disorder: Secondary | ICD-10-CM | POA: Diagnosis not present

## 2012-11-16 DIAGNOSIS — K219 Gastro-esophageal reflux disease without esophagitis: Secondary | ICD-10-CM | POA: Diagnosis not present

## 2012-11-16 DIAGNOSIS — F17200 Nicotine dependence, unspecified, uncomplicated: Secondary | ICD-10-CM | POA: Diagnosis not present

## 2012-11-16 DIAGNOSIS — R5381 Other malaise: Secondary | ICD-10-CM | POA: Diagnosis not present

## 2012-11-16 DIAGNOSIS — G8929 Other chronic pain: Secondary | ICD-10-CM | POA: Diagnosis not present

## 2012-11-16 DIAGNOSIS — E7800 Pure hypercholesterolemia, unspecified: Secondary | ICD-10-CM | POA: Diagnosis not present

## 2013-01-19 ENCOUNTER — Emergency Department (HOSPITAL_COMMUNITY)
Admission: EM | Admit: 2013-01-19 | Discharge: 2013-01-19 | Disposition: A | Discharge status: Home / Self Care | Payer: No Typology Code available for payment source | Attending: Emergency Medicine | Admitting: Emergency Medicine

## 2013-01-19 ENCOUNTER — Encounter (HOSPITAL_COMMUNITY): Payer: Self-pay | Admitting: Emergency Medicine

## 2013-01-19 DIAGNOSIS — Z8639 Personal history of other endocrine, nutritional and metabolic disease: Secondary | ICD-10-CM | POA: Insufficient documentation

## 2013-01-19 DIAGNOSIS — Z85038 Personal history of other malignant neoplasm of large intestine: Secondary | ICD-10-CM | POA: Insufficient documentation

## 2013-01-19 DIAGNOSIS — K219 Gastro-esophageal reflux disease without esophagitis: Secondary | ICD-10-CM | POA: Insufficient documentation

## 2013-01-19 DIAGNOSIS — M1990 Unspecified osteoarthritis, unspecified site: Secondary | ICD-10-CM | POA: Insufficient documentation

## 2013-01-19 DIAGNOSIS — G8929 Other chronic pain: Secondary | ICD-10-CM | POA: Insufficient documentation

## 2013-01-19 DIAGNOSIS — Z8742 Personal history of other diseases of the female genital tract: Secondary | ICD-10-CM | POA: Insufficient documentation

## 2013-01-19 DIAGNOSIS — Y92410 Unspecified street and highway as the place of occurrence of the external cause: Secondary | ICD-10-CM | POA: Insufficient documentation

## 2013-01-19 DIAGNOSIS — Z791 Long term (current) use of non-steroidal anti-inflammatories (NSAID): Secondary | ICD-10-CM | POA: Insufficient documentation

## 2013-01-19 DIAGNOSIS — Y9389 Activity, other specified: Secondary | ICD-10-CM | POA: Insufficient documentation

## 2013-01-19 DIAGNOSIS — Z8669 Personal history of other diseases of the nervous system and sense organs: Secondary | ICD-10-CM | POA: Insufficient documentation

## 2013-01-19 DIAGNOSIS — IMO0002 Reserved for concepts with insufficient information to code with codable children: Secondary | ICD-10-CM | POA: Insufficient documentation

## 2013-01-19 DIAGNOSIS — Z862 Personal history of diseases of the blood and blood-forming organs and certain disorders involving the immune mechanism: Secondary | ICD-10-CM | POA: Insufficient documentation

## 2013-01-19 DIAGNOSIS — F329 Major depressive disorder, single episode, unspecified: Secondary | ICD-10-CM | POA: Insufficient documentation

## 2013-01-19 DIAGNOSIS — I10 Essential (primary) hypertension: Secondary | ICD-10-CM | POA: Insufficient documentation

## 2013-01-19 DIAGNOSIS — Z79899 Other long term (current) drug therapy: Secondary | ICD-10-CM | POA: Insufficient documentation

## 2013-01-19 DIAGNOSIS — G43909 Migraine, unspecified, not intractable, without status migrainosus: Secondary | ICD-10-CM | POA: Insufficient documentation

## 2013-01-19 DIAGNOSIS — F411 Generalized anxiety disorder: Secondary | ICD-10-CM | POA: Insufficient documentation

## 2013-01-19 DIAGNOSIS — M545 Low back pain: Secondary | ICD-10-CM

## 2013-01-19 DIAGNOSIS — J449 Chronic obstructive pulmonary disease, unspecified: Secondary | ICD-10-CM | POA: Insufficient documentation

## 2013-01-19 DIAGNOSIS — M129 Arthropathy, unspecified: Secondary | ICD-10-CM | POA: Insufficient documentation

## 2013-01-19 DIAGNOSIS — J4489 Other specified chronic obstructive pulmonary disease: Secondary | ICD-10-CM | POA: Insufficient documentation

## 2013-01-19 DIAGNOSIS — F3289 Other specified depressive episodes: Secondary | ICD-10-CM | POA: Insufficient documentation

## 2013-01-19 DIAGNOSIS — F17200 Nicotine dependence, unspecified, uncomplicated: Secondary | ICD-10-CM | POA: Insufficient documentation

## 2013-01-19 MED ORDER — OXYCODONE-ACETAMINOPHEN 5-325 MG PO TABS: ORAL_TABLET | ORAL | Status: DC

## 2013-01-19 MED ORDER — OXYCODONE-ACETAMINOPHEN 5-325 MG PO TABS
1.0000 | ORAL_TABLET | Freq: Once | ORAL | Status: AC
  Administered 2013-01-19: 1 via ORAL
  Filled 2013-01-19: qty 1
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
## 2013-01-19 NOTE — ED Provider Notes (Signed)
Medical screening examination/treatment/procedure(s) were performed by non-physician practitioner and as supervising physician I was immediately available for consultation/collaboration.   Charles B. Bernette Mayers, MD 01/19/13 2140

## 2013-01-28 DIAGNOSIS — Z5181 Encounter for therapeutic drug level monitoring: Secondary | ICD-10-CM | POA: Diagnosis not present

## 2013-01-28 DIAGNOSIS — M549 Dorsalgia, unspecified: Secondary | ICD-10-CM | POA: Diagnosis not present

## 2013-01-28 DIAGNOSIS — Z79899 Other long term (current) drug therapy: Secondary | ICD-10-CM | POA: Diagnosis not present

## 2013-01-28 DIAGNOSIS — I10 Essential (primary) hypertension: Secondary | ICD-10-CM | POA: Diagnosis not present

## 2013-02-14 ENCOUNTER — Emergency Department (HOSPITAL_COMMUNITY)
Admission: EM | Admit: 2013-02-14 | Discharge: 2013-02-14 | Disposition: A | Discharge status: Home / Self Care | Payer: Medicare Other | Attending: Emergency Medicine | Admitting: Emergency Medicine

## 2013-02-14 ENCOUNTER — Encounter (HOSPITAL_COMMUNITY): Payer: Self-pay | Admitting: Emergency Medicine

## 2013-02-14 DIAGNOSIS — F329 Major depressive disorder, single episode, unspecified: Secondary | ICD-10-CM | POA: Insufficient documentation

## 2013-02-14 DIAGNOSIS — M545 Low back pain, unspecified: Secondary | ICD-10-CM | POA: Diagnosis not present

## 2013-02-14 DIAGNOSIS — Z8742 Personal history of other diseases of the female genital tract: Secondary | ICD-10-CM | POA: Insufficient documentation

## 2013-02-14 DIAGNOSIS — K219 Gastro-esophageal reflux disease without esophagitis: Secondary | ICD-10-CM | POA: Insufficient documentation

## 2013-02-14 DIAGNOSIS — Z79899 Other long term (current) drug therapy: Secondary | ICD-10-CM | POA: Insufficient documentation

## 2013-02-14 DIAGNOSIS — F17200 Nicotine dependence, unspecified, uncomplicated: Secondary | ICD-10-CM | POA: Diagnosis not present

## 2013-02-14 DIAGNOSIS — Z862 Personal history of diseases of the blood and blood-forming organs and certain disorders involving the immune mechanism: Secondary | ICD-10-CM | POA: Insufficient documentation

## 2013-02-14 DIAGNOSIS — Z8739 Personal history of other diseases of the musculoskeletal system and connective tissue: Secondary | ICD-10-CM | POA: Diagnosis not present

## 2013-02-14 DIAGNOSIS — Z85038 Personal history of other malignant neoplasm of large intestine: Secondary | ICD-10-CM | POA: Diagnosis not present

## 2013-02-14 DIAGNOSIS — Z8679 Personal history of other diseases of the circulatory system: Secondary | ICD-10-CM | POA: Insufficient documentation

## 2013-02-14 DIAGNOSIS — Z8639 Personal history of other endocrine, nutritional and metabolic disease: Secondary | ICD-10-CM | POA: Insufficient documentation

## 2013-02-14 DIAGNOSIS — F411 Generalized anxiety disorder: Secondary | ICD-10-CM | POA: Diagnosis not present

## 2013-02-14 DIAGNOSIS — G8929 Other chronic pain: Secondary | ICD-10-CM | POA: Diagnosis not present

## 2013-02-14 DIAGNOSIS — G4733 Obstructive sleep apnea (adult) (pediatric): Secondary | ICD-10-CM | POA: Insufficient documentation

## 2013-02-14 DIAGNOSIS — I10 Essential (primary) hypertension: Secondary | ICD-10-CM | POA: Insufficient documentation

## 2013-02-14 DIAGNOSIS — J4489 Other specified chronic obstructive pulmonary disease: Secondary | ICD-10-CM | POA: Insufficient documentation

## 2013-02-14 DIAGNOSIS — J449 Chronic obstructive pulmonary disease, unspecified: Secondary | ICD-10-CM | POA: Insufficient documentation

## 2013-02-14 DIAGNOSIS — M1990 Unspecified osteoarthritis, unspecified site: Secondary | ICD-10-CM | POA: Diagnosis not present

## 2013-02-14 DIAGNOSIS — F3289 Other specified depressive episodes: Secondary | ICD-10-CM | POA: Insufficient documentation

## 2013-02-14 MED ORDER — OXYCODONE-ACETAMINOPHEN 5-325 MG PO TABS: 1.0000 | ORAL_TABLET | Freq: Four times a day (QID) | ORAL | Status: DC | PRN

## 2013-02-14 MED ORDER — KETOROLAC TROMETHAMINE 30 MG/ML IJ SOLN
30.0000 mg | Freq: Once | INTRAMUSCULAR | Status: AC
  Administered 2013-02-14: 30 mg via INTRAMUSCULAR
  Filled 2013-02-14: qty 1

## 2013-02-14 NOTE — ED Provider Notes (Signed)
CSN: [id number]     Arrival date & time 02/14/13  1253 History   First MD Initiated Contact with Patient 02/14/13 1352     Chief Complaint  Patient presents with  . Back Pain   (Consider location/radiation/quality/duration/timing/severity/associated sxs/prior Treatment) HPI Comments: Patient with a history of chronic back pain presents today with lower back pain.  She has had pain for the past six years, but reports that the pain has been worse over the past 2 days.  She reports that in the past she has been told that she has a slipped disc.  She reports that she has been seen by a Orthopedist in the past and was told that she needs to have surgery, but that she is too obese to have the surgery.    Patient is a 57 y.o. female presenting with back pain. The history is provided by the patient.  Back Pain Location:  Lumbar spine Radiates to:  Does not radiate Onset quality:  Gradual Timing:  Constant Progression:  Worsening Context: not recent injury   Relieved by:  Nothing Worsened by:  Bending Ineffective treatments:  NSAIDs Associated symptoms: no abdominal pain, no bladder incontinence, no bowel incontinence, no dysuria, no fever, no numbness, no paresthesias, no tingling and no weakness     Past Medical History  Diagnosis Date  . Hypertension   . Asthma   . COPD (chronic obstructive pulmonary disease)   . Anxiety   . Cancer   . Colon cancer   . Dyslipidemia   . Migraine headache   . Chronic bronchitis   . Uterine fibroid   . Ovarian cyst   . GERD (gastroesophageal reflux disease)   . Morbid obesity   . Obstructive sleep apnea     mild  . Chronic pain   . Chronic back pain   . Arthritis   . Osteoarthritis   . Depression    Past Surgical History  Procedure Laterality Date  . Knee arthroscopy      bil.  . Carpal tunnel release      rt  . Mouth surgery      teeth extraction  . Cesarean section    . Subtotal colectomy    . Colonoscopy    . Tubal ligation      Family History  Problem Relation Age of Onset  . Uterine cancer Mother   . Stroke Father   . Colon cancer Maternal Uncle   . Diabetes Father   . Ovarian cancer Mother   . Hypertension Father   . Breast cancer Maternal Grandmother   . Diabetes Sister   . Asthma Child   . Asthma Child    History  Substance Use Topics  . Smoking status: Current Some Day Smoker -- 1.00 packs/day for 30 years    Types: Cigarettes  . Smokeless tobacco: Never Used     Comment: tobacco info given 07/21/2011  . Alcohol Use: No   OB History   Grav Para Term Preterm Abortions TAB SAB Ect Mult Living   2 2 2       2      Review of Systems  Constitutional: Negative for fever.  Gastrointestinal: Negative for abdominal pain and bowel incontinence.  Genitourinary: Negative for bladder incontinence and dysuria.  Musculoskeletal: Positive for back pain.  Neurological: Negative for tingling, weakness, numbness and paresthesias.  All other systems reviewed and are negative.    Allergies  Kiwi extract and Aspirin  Home Medications   Current Outpatient Rx  Name  Route  Sig  Dispense  Refill  . albuterol (PROVENTIL HFA;VENTOLIN HFA) 108 (90 BASE) MCG/ACT inhaler   Inhalation   Inhale 2 puffs into the lungs every 6 (six) hours as needed. For shortness of breeath         . hydrochlorothiazide (HYDRODIURIL) 25 MG tablet   Oral   Take 25 mg by mouth daily.          Marland Kitchen ibuprofen (ADVIL,MOTRIN) 200 MG tablet   Oral   Take 400 mg by mouth every 8 (eight) hours as needed. For pain.         Marland Kitchen omeprazole (PRILOSEC) 40 MG capsule   Oral   Take 40 mg by mouth daily.          Marland Kitchen oxyCODONE-acetaminophen (PERCOCET/ROXICET) 5-325 MG per tablet   Oral   Take 1-2 tablets by mouth every 6 (six) hours as needed for pain.   15 tablet   0   . potassium chloride SA (K-DUR,KLOR-CON) 20 MEQ tablet   Oral   Take 1 tablet (20 mEq total) by mouth 2 (two) times daily.   60 tablet   3   . sertraline  (ZOLOFT) 50 MG tablet   Oral   Take 1 tablet (50 mg total) by mouth daily.   30 tablet   3   . traZODone (DESYREL) 50 MG tablet   Oral   Take 50 mg by mouth at bedtime.           BP 136/72  Pulse 60  Temp(Src) 98.7 F (37.1 C) (Oral)  Resp 16  SpO2 100%  LMP 05/24/2003 Physical Exam  Nursing note and vitals reviewed. Constitutional: She is oriented to person, place, and time. She appears well-developed and well-nourished. No distress.  Morbidly obese  HENT:  Head: Normocephalic and atraumatic.  Eyes: Conjunctivae and EOM are normal. Pupils are equal, round, and reactive to light.  Neck: Normal range of motion and full passive range of motion without pain. Neck supple. No spinous process tenderness and no muscular tenderness present. No rigidity. Normal range of motion present.  Cardiovascular: Normal rate, regular rhythm and intact distal pulses.   Pulmonary/Chest: Effort normal and breath sounds normal.  Musculoskeletal:       Cervical back: She exhibits normal range of motion, no tenderness, no bony tenderness and no pain.       Thoracic back: She exhibits no tenderness, no bony tenderness and no pain.       Lumbar back: She exhibits tenderness, bony tenderness and pain. She exhibits no spasm and normal pulse.       Right foot: She exhibits no swelling.       Left foot: She exhibits no swelling.  Bilateral lower extremities nontender without color change, baseline range of motion of extremities with intact distal pulses, capillary refill less than 2 seconds bilaterally.  Pt has increased pain w ROM of lumbar spine. Pain w ambulation, no sign of ataxia.  Neurological: She is alert and oriented to person, place, and time. She has normal strength and normal reflexes. No sensory deficit. Gait (no ataxia, slowed and hunched d/t pain ) abnormal.  Sensation at baseline for light touch in all 4 distal extremities, motor symmetric & bilateral 5/5 (hips: abduction, adduction, flexion;  knee: flexion & extension; foot: dorsiflexion, plantar flexion, toes: dorsi flexion) Patellar & ankle reflexes intact.   Skin: Skin is warm and dry. No rash noted. She is not diaphoretic. No erythema.  Psychiatric: She  has a normal mood and affect.    ED Course  Procedures (including critical care time) Labs Review Labs Reviewed - No data to display Imaging Review No results found.  MDM  No diagnosis found. Patient presenting with chronic back pain.  Patient with back pain.  No neurological deficits and normal neuro exam.  Patient can walk but states is painful.  No loss of bowel or bladder control.  No concern for cauda equina.  No fever, night sweats, weight loss, h/o cancer, IVDU.  RICE protocol and pain medicine indicated and discussed with patient.     Pascal Lux [place], PA-C 02/15/13 2127

## 2013-02-14 NOTE — ED Notes (Signed)
Pt states that she has arthritis and a slipped disc.  C/o back pain x 6 years.  States that it has gotten worse since it got colder.

## 2013-02-16 NOTE — ED Provider Notes (Signed)
Medical screening examination/treatment/procedure(s) were performed by non-physician practitioner and as supervising physician I was immediately available for consultation/collaboration.   Elma Shands T Subhan Hoopes, MD 02/16/13 1647 

## 2013-03-19 DIAGNOSIS — G8929 Other chronic pain: Secondary | ICD-10-CM | POA: Diagnosis not present

## 2013-03-19 DIAGNOSIS — Z79899 Other long term (current) drug therapy: Secondary | ICD-10-CM | POA: Diagnosis not present

## 2013-03-19 DIAGNOSIS — I10 Essential (primary) hypertension: Secondary | ICD-10-CM | POA: Diagnosis not present

## 2013-03-19 DIAGNOSIS — Z5181 Encounter for therapeutic drug level monitoring: Secondary | ICD-10-CM | POA: Diagnosis not present

## 2013-03-19 DIAGNOSIS — R5381 Other malaise: Secondary | ICD-10-CM | POA: Diagnosis not present

## 2013-03-19 DIAGNOSIS — G4700 Insomnia, unspecified: Secondary | ICD-10-CM | POA: Diagnosis not present

## 2013-03-25 ENCOUNTER — Emergency Department (HOSPITAL_COMMUNITY)
Admission: EM | Admit: 2013-03-25 | Discharge: 2013-03-25 | Disposition: A | Discharge status: Home / Self Care | Payer: Medicare Other | Attending: Emergency Medicine | Admitting: Emergency Medicine

## 2013-03-25 ENCOUNTER — Emergency Department (HOSPITAL_COMMUNITY): Payer: Medicare Other

## 2013-03-25 ENCOUNTER — Encounter (HOSPITAL_COMMUNITY): Payer: Self-pay | Admitting: Emergency Medicine

## 2013-03-25 DIAGNOSIS — F17200 Nicotine dependence, unspecified, uncomplicated: Secondary | ICD-10-CM | POA: Diagnosis not present

## 2013-03-25 DIAGNOSIS — M1990 Unspecified osteoarthritis, unspecified site: Secondary | ICD-10-CM | POA: Diagnosis not present

## 2013-03-25 DIAGNOSIS — J449 Chronic obstructive pulmonary disease, unspecified: Secondary | ICD-10-CM | POA: Insufficient documentation

## 2013-03-25 DIAGNOSIS — M25551 Pain in right hip: Secondary | ICD-10-CM

## 2013-03-25 DIAGNOSIS — M25559 Pain in unspecified hip: Secondary | ICD-10-CM | POA: Diagnosis not present

## 2013-03-25 DIAGNOSIS — Z85038 Personal history of other malignant neoplasm of large intestine: Secondary | ICD-10-CM | POA: Diagnosis not present

## 2013-03-25 DIAGNOSIS — IMO0002 Reserved for concepts with insufficient information to code with codable children: Secondary | ICD-10-CM | POA: Diagnosis not present

## 2013-03-25 DIAGNOSIS — G8929 Other chronic pain: Secondary | ICD-10-CM | POA: Insufficient documentation

## 2013-03-25 DIAGNOSIS — Y92009 Unspecified place in unspecified non-institutional (private) residence as the place of occurrence of the external cause: Secondary | ICD-10-CM | POA: Insufficient documentation

## 2013-03-25 DIAGNOSIS — M549 Dorsalgia, unspecified: Secondary | ICD-10-CM | POA: Insufficient documentation

## 2013-03-25 DIAGNOSIS — E785 Hyperlipidemia, unspecified: Secondary | ICD-10-CM | POA: Diagnosis not present

## 2013-03-25 DIAGNOSIS — J45909 Unspecified asthma, uncomplicated: Secondary | ICD-10-CM | POA: Insufficient documentation

## 2013-03-25 DIAGNOSIS — R209 Unspecified disturbances of skin sensation: Secondary | ICD-10-CM | POA: Diagnosis not present

## 2013-03-25 DIAGNOSIS — S8990XA Unspecified injury of unspecified lower leg, initial encounter: Secondary | ICD-10-CM | POA: Diagnosis not present

## 2013-03-25 DIAGNOSIS — S79919A Unspecified injury of unspecified hip, initial encounter: Secondary | ICD-10-CM | POA: Diagnosis not present

## 2013-03-25 DIAGNOSIS — F411 Generalized anxiety disorder: Secondary | ICD-10-CM | POA: Insufficient documentation

## 2013-03-25 DIAGNOSIS — I10 Essential (primary) hypertension: Secondary | ICD-10-CM | POA: Diagnosis not present

## 2013-03-25 DIAGNOSIS — F329 Major depressive disorder, single episode, unspecified: Secondary | ICD-10-CM | POA: Diagnosis not present

## 2013-03-25 DIAGNOSIS — M545 Low back pain: Secondary | ICD-10-CM | POA: Diagnosis not present

## 2013-03-25 DIAGNOSIS — M25561 Pain in right knee: Secondary | ICD-10-CM

## 2013-03-25 DIAGNOSIS — K219 Gastro-esophageal reflux disease without esophagitis: Secondary | ICD-10-CM | POA: Insufficient documentation

## 2013-03-25 DIAGNOSIS — M25579 Pain in unspecified ankle and joints of unspecified foot: Secondary | ICD-10-CM | POA: Diagnosis not present

## 2013-03-25 DIAGNOSIS — Z79899 Other long term (current) drug therapy: Secondary | ICD-10-CM | POA: Insufficient documentation

## 2013-03-25 DIAGNOSIS — F3289 Other specified depressive episodes: Secondary | ICD-10-CM | POA: Insufficient documentation

## 2013-03-25 DIAGNOSIS — R52 Pain, unspecified: Secondary | ICD-10-CM | POA: Diagnosis not present

## 2013-03-25 DIAGNOSIS — Y9389 Activity, other specified: Secondary | ICD-10-CM | POA: Insufficient documentation

## 2013-03-25 DIAGNOSIS — W06XXXA Fall from bed, initial encounter: Secondary | ICD-10-CM | POA: Insufficient documentation

## 2013-03-25 DIAGNOSIS — J4489 Other specified chronic obstructive pulmonary disease: Secondary | ICD-10-CM | POA: Insufficient documentation

## 2013-03-25 DIAGNOSIS — M25571 Pain in right ankle and joints of right foot: Secondary | ICD-10-CM

## 2013-03-25 DIAGNOSIS — M25569 Pain in unspecified knee: Secondary | ICD-10-CM | POA: Diagnosis not present

## 2013-03-25 DIAGNOSIS — W19XXXA Unspecified fall, initial encounter: Secondary | ICD-10-CM

## 2013-03-25 LAB — CBC WITH DIFFERENTIAL/PLATELET
Basophils Absolute: 0 10*3/uL (ref 0.0–0.1)
Basophils Relative: 0 % (ref 0–1)
Eosinophils Absolute: 0.2 10*3/uL (ref 0.0–0.7)
Eosinophils Relative: 3 % (ref 0–5)
Lymphocytes Relative: 34 % (ref 12–46)
MCV: 97.9 fL (ref 78.0–100.0)
Monocytes Absolute: 0.5 10*3/uL (ref 0.1–1.0)
Platelets: 198 10*3/uL (ref 150–400)
RBC: 4.28 MIL/uL (ref 3.87–5.11)
RDW: 13.9 % (ref 11.5–15.5)
WBC: 5.8 10*3/uL (ref 4.0–10.5)

## 2013-03-25 LAB — BASIC METABOLIC PANEL
Calcium: 8.9 mg/dL (ref 8.4–10.5)
GFR calc Af Amer: >90 mL/min (ref >90)
GFR calc non Af Amer: >90 mL/min (ref >90)
Sodium: 137 mEq/L (ref 135–145)

## 2013-03-25 MED ORDER — OXYCODONE-ACETAMINOPHEN 5-325 MG PO TABS: 2.0000 | ORAL_TABLET | Freq: Four times a day (QID) | ORAL | Status: DC | PRN

## 2013-03-25 MED ORDER — MORPHINE SULFATE 4 MG/ML IJ SOLN
4.0000 mg | Freq: Once | INTRAMUSCULAR | Status: AC
  Administered 2013-03-25: 4 mg via INTRAVENOUS
  Filled 2013-03-25: qty 1

## 2013-03-25 NOTE — ED Notes (Signed)
Per pt woke up at 0600 this am. Pt reports when stood up "it was like right leg was not there; tingly and numb." Pt reports when she stood left leg went to the side and right leg went behind her. Pt reports left leg pain 9/10 and tingling in right leg. Pt states feet are sore and hx of back issues.

## 2013-03-25 NOTE — ED Notes (Signed)
[place], PA at bedside 

## 2013-03-25 NOTE — ED Provider Notes (Signed)
Medical screening examination/treatment/procedure(s) were performed by non-physician practitioner and as supervising physician I was immediately available for consultation/collaboration. Carrisa Keller, MD, FACEP   Loring Liskey L Jaydin Boniface, MD 03/25/13 1555 

## 2013-03-25 NOTE — ED Provider Notes (Signed)
CSN: [id number]     Arrival date & time 03/25/13  0751 History   First MD Initiated Contact with Patient 03/25/13 0800     Chief Complaint  Patient presents with  . Fall   (Consider location/radiation/quality/duration/timing/severity/associated sxs/prior Treatment) HPI Comments: Patient presents to the emergency department with chief complaint of right lower extremity pain. She states that when she awoke this morning, she went to get out of bed, and fell. She states that she fell because she was unable to fill her right leg. She has a history of chronic back pain with a bulge disc. She states that after she fell, her leg was twisted behind her. She reports right hip, right knee, and right ankle pain. She has not tried taking anything to alleviate her symptoms.  Additionally, patient states that she has a history of colon cancer, and she has had some episodes of being incontinent of stool. She denies any urinary incontinence. She denies fevers, or chills.  The history is provided by the patient. No language interpreter was used.    Past Medical History  Diagnosis Date  . Hypertension   . Asthma   . COPD (chronic obstructive pulmonary disease)   . Anxiety   . Cancer   . Colon cancer   . Dyslipidemia   . Migraine headache   . Chronic bronchitis   . Uterine fibroid   . Ovarian cyst   . GERD (gastroesophageal reflux disease)   . Morbid obesity   . Obstructive sleep apnea     mild  . Chronic pain   . Chronic back pain   . Arthritis   . Osteoarthritis   . Depression    Past Surgical History  Procedure Laterality Date  . Knee arthroscopy      bil.  . Carpal tunnel release      rt  . Mouth surgery      teeth extraction  . Cesarean section    . Subtotal colectomy    . Colonoscopy    . Tubal ligation     Family History  Problem Relation Age of Onset  . Uterine cancer Mother   . Stroke Father   . Colon cancer Maternal Uncle   . Diabetes Father   . Ovarian cancer Mother    . Hypertension Father   . Breast cancer Maternal Grandmother   . Diabetes Sister   . Asthma Child   . Asthma Child    History  Substance Use Topics  . Smoking status: Current Some Day Smoker -- 1.00 packs/day for 30 years    Types: Cigarettes  . Smokeless tobacco: Never Used     Comment: tobacco info given 07/21/2011  . Alcohol Use: No   OB History   Grav Para Term Preterm Abortions TAB SAB Ect Mult Living   2 2 2       2      Review of Systems  All other systems reviewed and are negative.    Allergies  Kiwi extract and Aspirin  Home Medications   Current Outpatient Rx  Name  Route  Sig  Dispense  Refill  . albuterol (PROVENTIL HFA;VENTOLIN HFA) 108 (90 BASE) MCG/ACT inhaler   Inhalation   Inhale 2 puffs into the lungs every 6 (six) hours as needed. For shortness of breeath         . hydrochlorothiazide (HYDRODIURIL) 25 MG tablet   Oral   Take 25 mg by mouth daily.          Marland Kitchen  ibuprofen (ADVIL,MOTRIN) 200 MG tablet   Oral   Take 400 mg by mouth every 8 (eight) hours as needed. For pain.         Marland Kitchen omeprazole (PRILOSEC) 40 MG capsule   Oral   Take 40 mg by mouth daily.          Marland Kitchen oxyCODONE-acetaminophen (PERCOCET/ROXICET) 5-325 MG per tablet   Oral   Take 1-2 tablets by mouth every 6 (six) hours as needed for pain.   15 tablet   0   . oxyCODONE-acetaminophen (PERCOCET/ROXICET) 5-325 MG per tablet   Oral   Take 1-2 tablets by mouth every 6 (six) hours as needed for pain.   20 tablet   0   . potassium chloride SA (K-DUR,KLOR-CON) 20 MEQ tablet   Oral   Take 1 tablet (20 mEq total) by mouth 2 (two) times daily.   60 tablet   3   . sertraline (ZOLOFT) 50 MG tablet   Oral   Take 1 tablet (50 mg total) by mouth daily.   30 tablet   3   . traZODone (DESYREL) 50 MG tablet   Oral   Take 50 mg by mouth at bedtime.           BP 120/76  Pulse 86  Temp(Src) 97.6 F (36.4 C) (Oral)  Resp 18  Ht 4\' 9"  (1.448 m)  Wt 270 lb (122.471 kg)  BMI  58.41 kg/m2  SpO2 98%  LMP 05/24/2003 Physical Exam  Nursing note and vitals reviewed. Constitutional: She is oriented to person, place, and time. She appears well-developed and well-nourished. No distress.  HENT:  Head: Normocephalic and atraumatic.  Eyes: Conjunctivae and EOM are normal. Right eye exhibits no discharge. Left eye exhibits no discharge. No scleral icterus.  Neck: Normal range of motion. Neck supple. No tracheal deviation present.  Cardiovascular: Normal rate, regular rhythm, normal heart sounds and intact distal pulses.  Exam reveals no gallop and no friction rub.   No murmur heard. Brisk capillary refill  Pulmonary/Chest: Effort normal and breath sounds normal. No respiratory distress. She has no wheezes.  Abdominal: Soft. She exhibits no distension. There is no tenderness.  Genitourinary:  Normal rectal tone  Musculoskeletal: Normal range of motion.  Lumbar paraspinal muscles tender to palpation, no bony tenderness, step-offs, or gross abnormality or deformity of spine, patient is able to ambulate, moves all extremities  Bilateral great toe extension intact Bilateral plantar/dorsiflexion intact  Right hip, right knee, and right ankle are moderately tender to palpation, no bony abnormality or deformity  Neurological: She is alert and oriented to person, place, and time. She has normal reflexes.  Sensation and strength intact bilaterally Symmetrical reflexes  Skin: Skin is warm. She is not diaphoretic.  Psychiatric: She has a normal mood and affect. Her behavior is normal. Judgment and thought content normal.    ED Course  Procedures (including critical care time) Results for orders placed during the hospital encounter of 03/25/13  CBC WITH DIFFERENTIAL      Result Value Range   WBC 5.8  4.0 - 10.5 K/uL   RBC 4.28  3.87 - 5.11 MIL/uL   Hemoglobin 13.8  12.0 - 15.0 g/dL   HCT 13.2  44.0 - 10.2 %   MCV 97.9  78.0 - 100.0 fL   MCH 32.2  26.0 - 34.0 pg   MCHC  32.9  30.0 - 36.0 g/dL   RDW 72.5  36.6 - 44.0 %   Platelets 198  150 - 400 K/uL   Neutrophils Relative % 54  43 - 77 %   Neutro Abs 3.1  1.7 - 7.7 K/uL   Lymphocytes Relative 34  12 - 46 %   Lymphs Abs 2.0  0.7 - 4.0 K/uL   Monocytes Relative 9  3 - 12 %   Monocytes Absolute 0.5  0.1 - 1.0 K/uL   Eosinophils Relative 3  0 - 5 %   Eosinophils Absolute 0.2  0.0 - 0.7 K/uL   Basophils Relative 0  0 - 1 %   Basophils Absolute 0.0  0.0 - 0.1 K/uL  BASIC METABOLIC PANEL      Result Value Range   Sodium 137  135 - 145 mEq/L   Potassium 3.7  3.5 - 5.1 mEq/L   Chloride 98  96 - 112 mEq/L   CO2 31  19 - 32 mEq/L   Glucose, Bld 102 (*) 70 - 99 mg/dL   BUN 10  6 - 23 mg/dL   Creatinine, Ser 1.61  0.50 - 1.10 mg/dL   Calcium 8.9  8.4 - 09.6 mg/dL   GFR calc non Af Amer >90  >90 mL/min   GFR calc Af Amer >90  >90 mL/min   Dg Lumbar Spine Complete  03/25/2013   CLINICAL DATA:  Low back pain after fall.  EXAM: LUMBAR SPINE - COMPLETE 4+ VIEW  COMPARISON:  May 12, 2012.  FINDINGS: No fracture is noted. Minimal grade 1 anterolisthesis of L4-5 is noted to 2 mild posterior facet hypertrophy. Moderate degenerative disc disease is noted at L5-S1.  IMPRESSION: Degenerative changes as described above which are stable compared to prior exam. No acute abnormality seen in the lumbar spine.   Electronically Signed   By: Roque Lias M.D.   On: 03/25/2013 09:23   Dg Hip Complete Right  03/25/2013   CLINICAL DATA:  Fall today. Right hip common knee, and ankle pain.  EXAM: RIGHT HIP - COMPLETE 2+ VIEW  COMPARISON:  Multiple priors  FINDINGS: There is no evidence of hip fracture or dislocation. Mild degenerative changes of both hips. No acute soft tissue abnormality.  IMPRESSION: No acute findings.   Electronically Signed   By: Jerene Dilling M.D.   On: 03/25/2013 09:27   Dg Ankle Complete Right  03/25/2013   CLINICAL DATA:  Pain post trauma  EXAM: RIGHT ANKLE - COMPLETE 3+ VIEW  COMPARISON:  None.   FINDINGS: Frontal, oblique, and lateral views were obtained. There is swelling laterally. No fracture or effusion. Ankle mortise appears intact.  IMPRESSION: Swelling laterally. No fracture. Mortise intact.   Electronically Signed   By: Bretta Bang M.D.   On: 03/25/2013 09:21   Dg Knee Complete 4 Views Right  03/25/2013   CLINICAL DATA:  Pain post trauma  EXAM: RIGHT KNEE - COMPLETE 4+ VIEW  COMPARISON:  None.  FINDINGS: Frontal, lateral, and bilateral oblique views were obtained. There is no fracture, dislocation, or effusion. There is rather marked narrowing medially. There is moderate patellofemoral joint space narrowing. There is spurring in all compartments. No erosive change.  IMPRESSION: Osteoarthritic change, most marked medially. No fracture or effusion.   Electronically Signed   By: Bretta Bang M.D.   On: 03/25/2013 09:23     EKG Interpretation   None       MDM   1. Back pain   2. Knee pain, acute, right   3. Hip pain, acute, right   4. Ankle pain, right  5. Fall, initial encounter     Patient with chronic back pain, and bulge disc, who fell this morning. Patient complaining of right lower extremity pain. Will treat pain here, and check plain films.  Patient states that ever since she had colon cancer in 1993, she has had some difficulty with stool incontinence.  She can feel the sensation that needs to go.  She has good rectal tone.  Patient discussed with Dr. Lynelle Doctor, who recommends plain film imaging.  We reviewed the patient's prior MRI from last year.  Do not feel that advanced imaging is indicated at this time.  Will refer patient back to her PCP.  Plain films are negative.  Will give another dose of pain medicine, and ambulate the patient.  Discharge to home with ortho follow-up.  Patient ambulates with cane.  This is baseline.  Roxy Horseman, PA-C 03/25/13 [phone]

## 2013-03-25 NOTE — ED Notes (Signed)
Ortho tech called 

## 2013-04-01 DIAGNOSIS — I10 Essential (primary) hypertension: Secondary | ICD-10-CM | POA: Diagnosis not present

## 2013-04-01 DIAGNOSIS — G8929 Other chronic pain: Secondary | ICD-10-CM | POA: Diagnosis not present

## 2013-04-01 DIAGNOSIS — R609 Edema, unspecified: Secondary | ICD-10-CM | POA: Diagnosis not present

## 2013-04-01 DIAGNOSIS — R5381 Other malaise: Secondary | ICD-10-CM | POA: Diagnosis not present

## 2013-04-07 IMAGING — CR DG SHOULDER 2+V*L*
3 series · 3 of 3 positions shown · non-contrast
Comparison: None.

CLINICAL DATA: Left shoulder pain.  No known injury.

LEFT SHOULDER - 2+ VIEW

[w shoulder internal left]
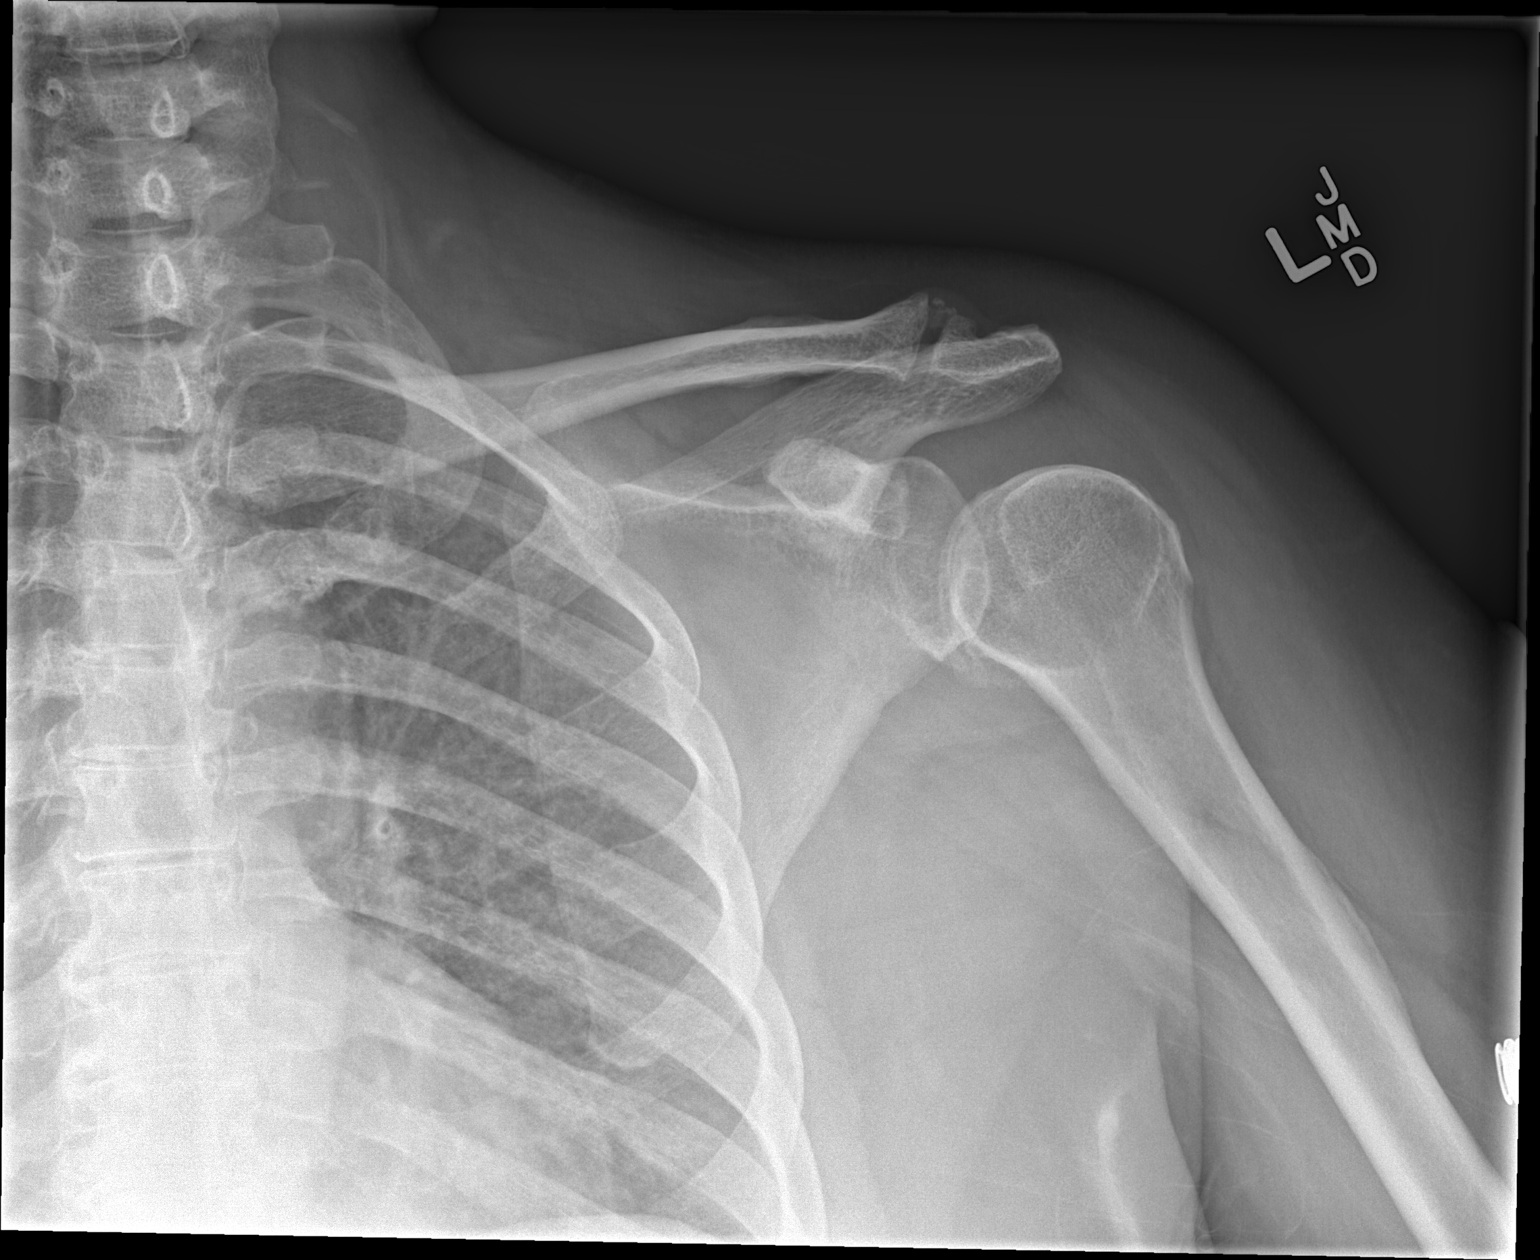

[w shoulder external left]
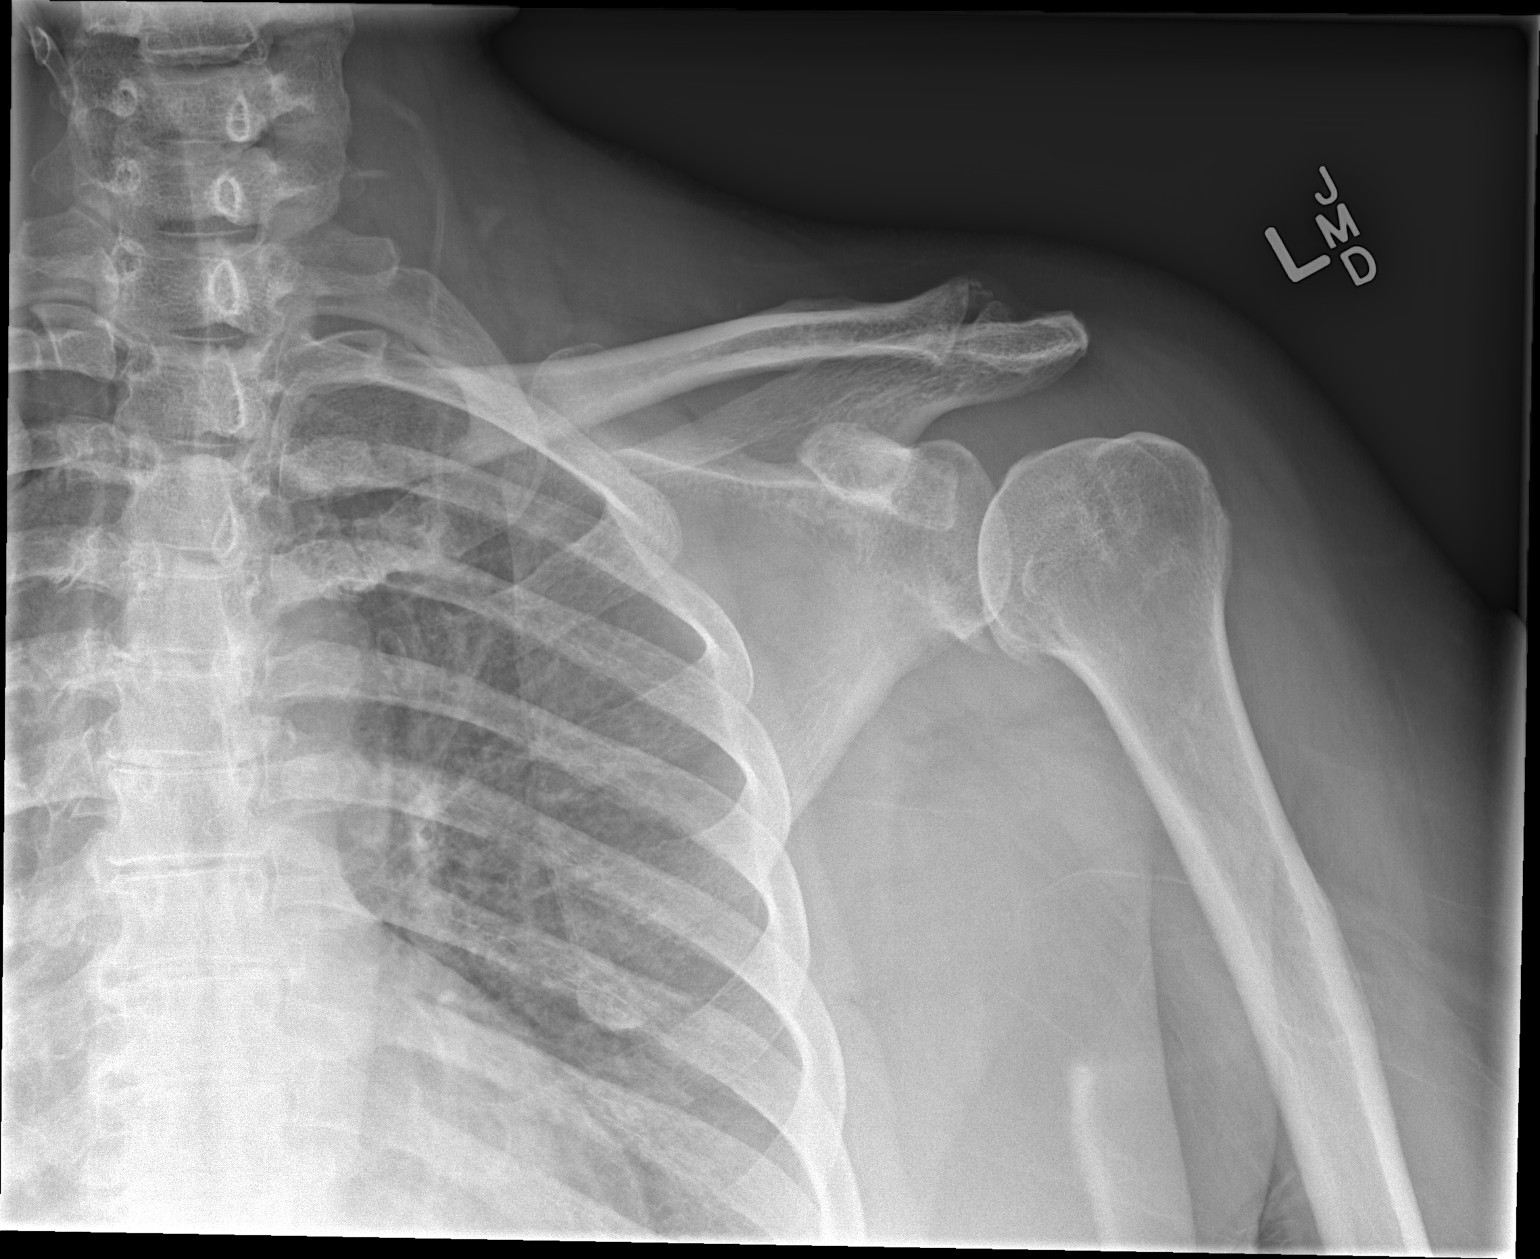

[w shoulder y-view left]
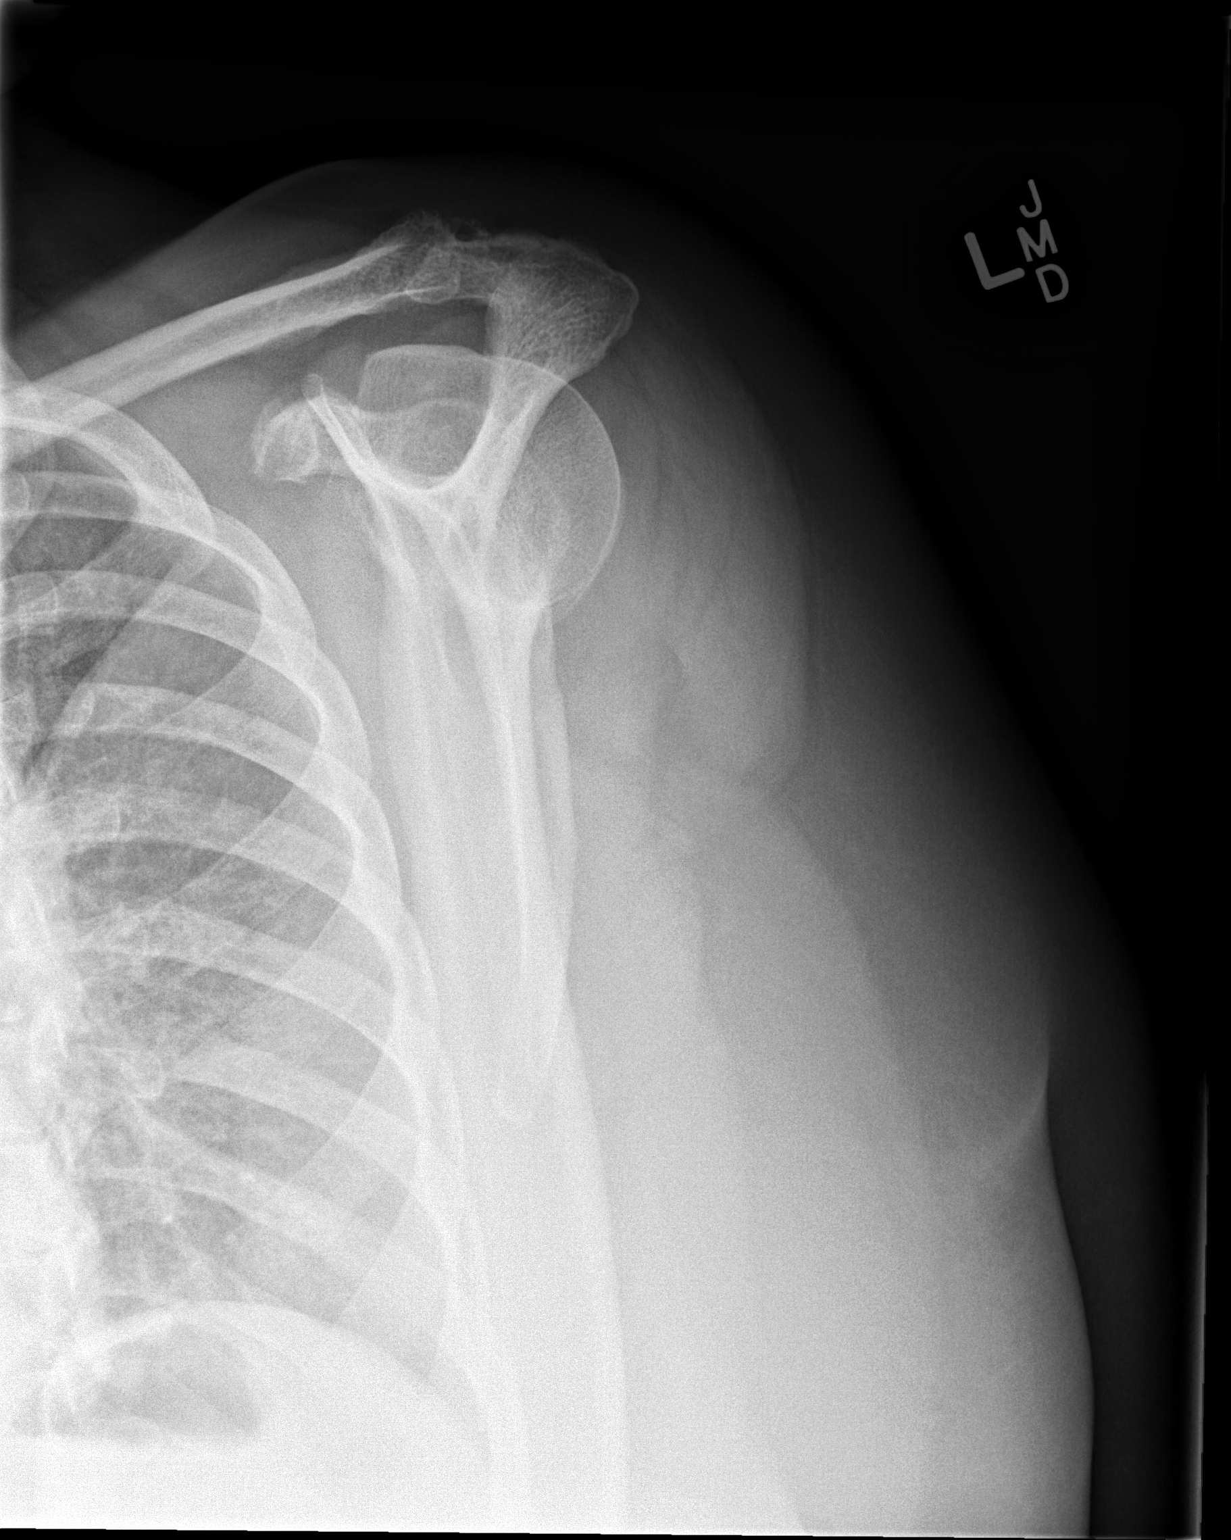

[3 of 3 positions shown; findings below may reference images not displayed]

FINDINGS: There are glenohumeral and AC joint degenerative changes.
No acute fracture.  No dislocation.  The left lung apex is clear.
IMPRESSION: AC joint and glenohumeral joint degenerative changes.
No acute bony findings.

## 2013-04-07 IMAGING — CR DG CHEST 2V
2 series · 2 of 2 positions shown · non-contrast
Comparison: 09/29/2010.

CLINICAL DATA: Chest pain, left shoulder pain.

CHEST - 2 VIEW

[w chest pa]
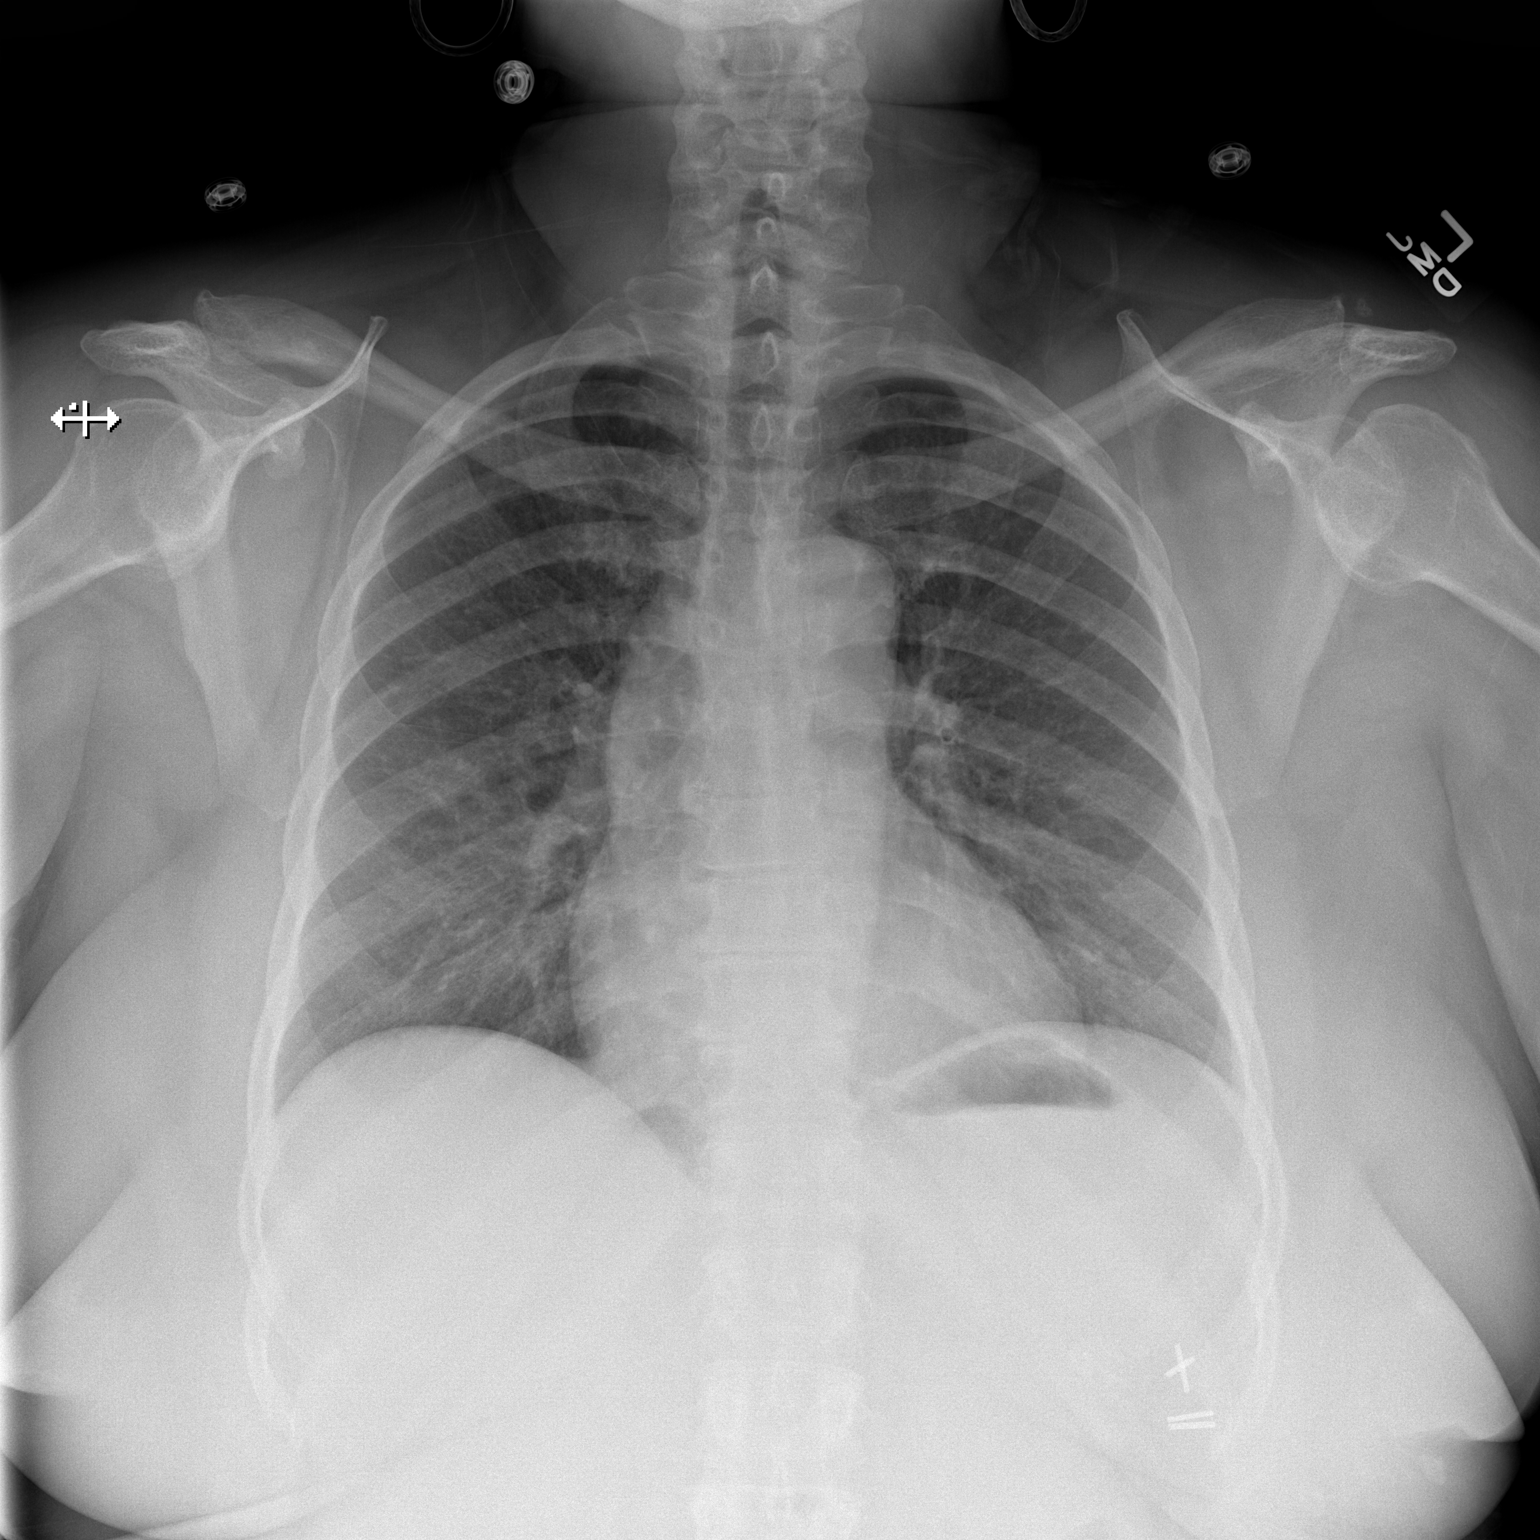

[w chest lat]
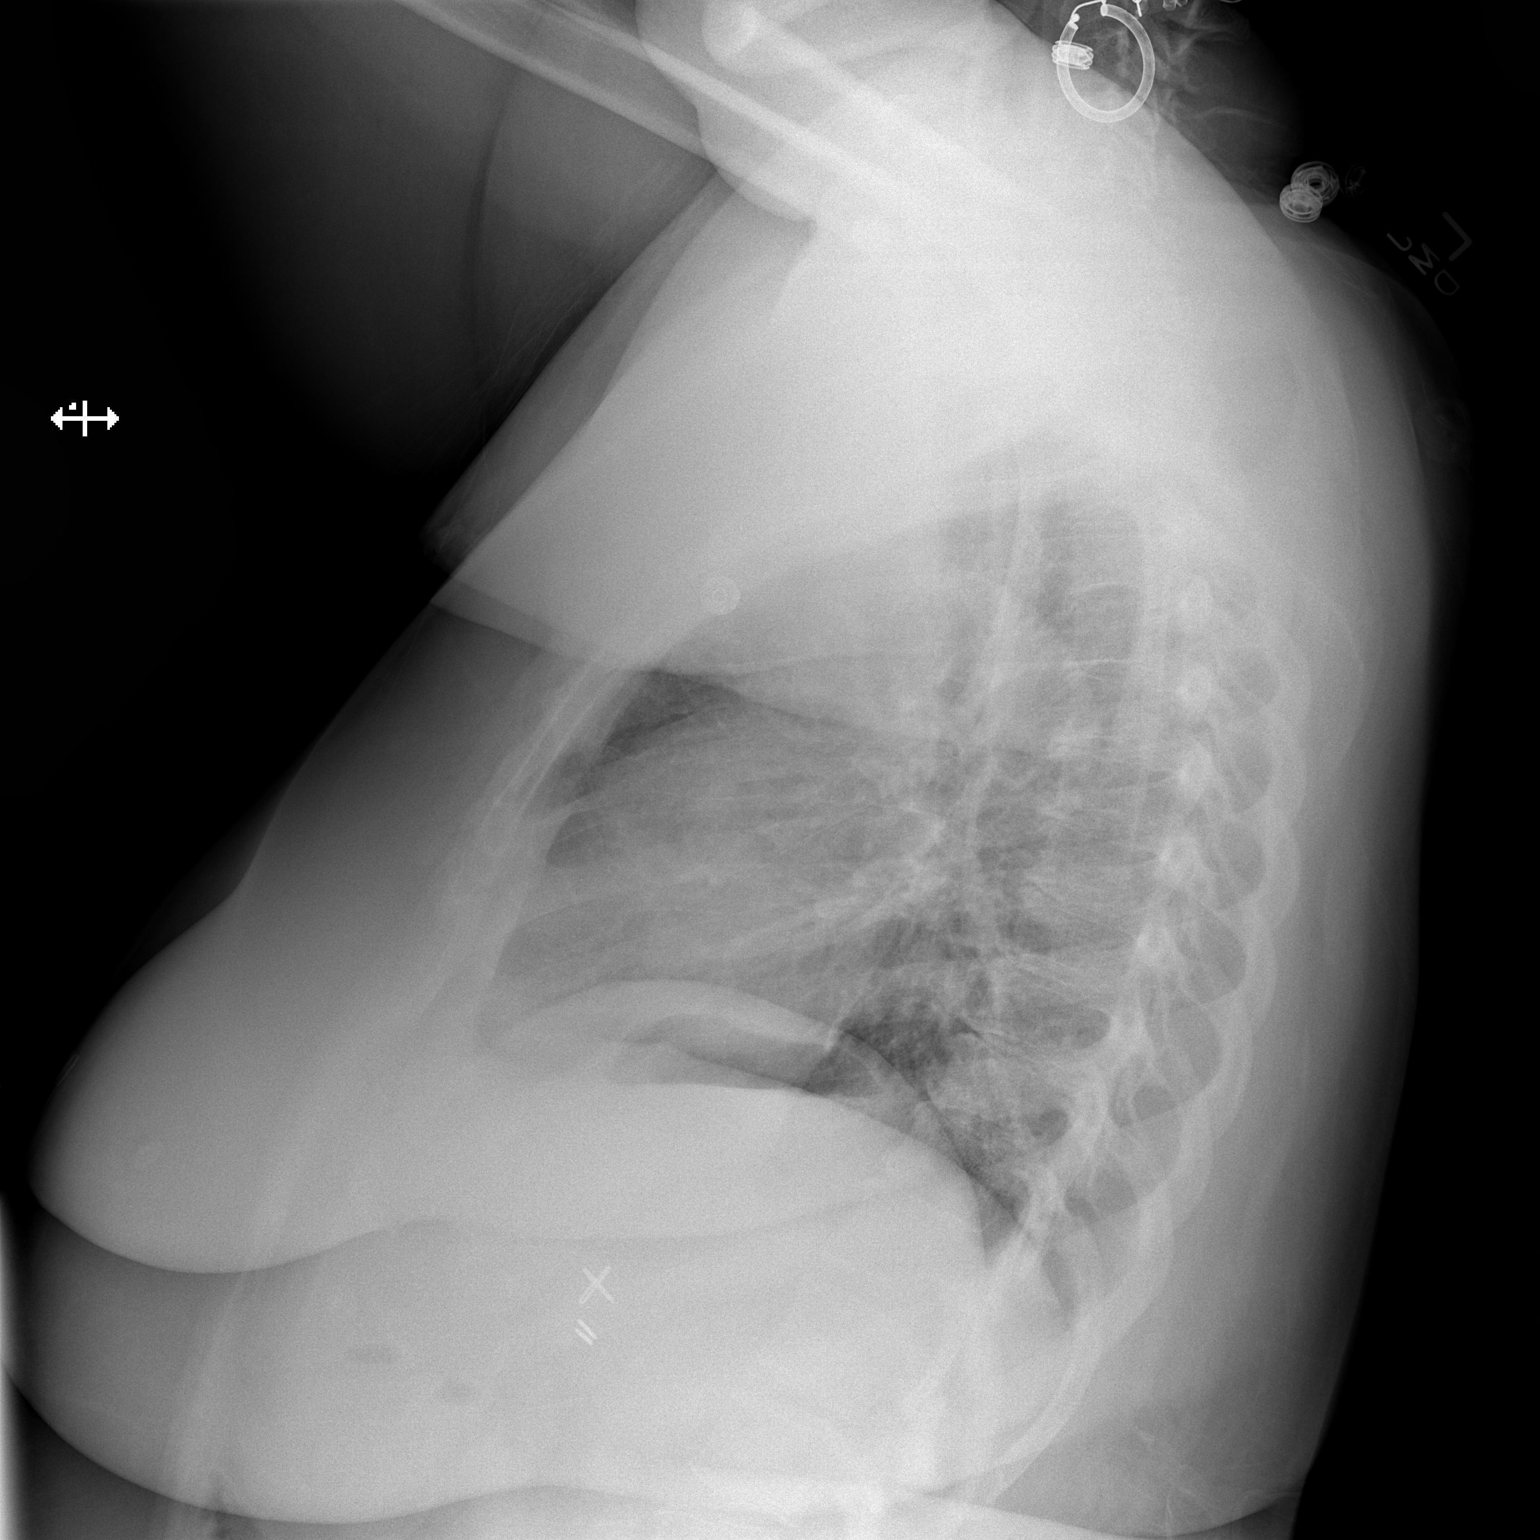

[2 of 2 positions shown; findings below may reference images not displayed]

FINDINGS: Trachea midline.  Heart size normal.  Mild interstitial
prominence appears chronic.  No pleural fluid.
IMPRESSION: Mild interstitial prominence appears chronic.  No acute findings.

## 2013-04-18 ENCOUNTER — Emergency Department (HOSPITAL_COMMUNITY): Payer: Medicare Other

## 2013-04-18 ENCOUNTER — Encounter (HOSPITAL_COMMUNITY): Payer: Self-pay | Admitting: Emergency Medicine

## 2013-04-18 ENCOUNTER — Inpatient Hospital Stay (HOSPITAL_COMMUNITY)
Admission: EM | Admit: 2013-04-18 | Discharge: 2013-04-23 | DRG: 191 | Disposition: A | Discharge status: Home / Self Care | Payer: Medicare Other | Attending: Internal Medicine | Admitting: Internal Medicine

## 2013-04-18 DIAGNOSIS — G894 Chronic pain syndrome: Secondary | ICD-10-CM | POA: Diagnosis not present

## 2013-04-18 DIAGNOSIS — J441 Chronic obstructive pulmonary disease with (acute) exacerbation: Secondary | ICD-10-CM | POA: Diagnosis not present

## 2013-04-18 DIAGNOSIS — Z6841 Body Mass Index (BMI) 40.0 and over, adult: Secondary | ICD-10-CM

## 2013-04-18 DIAGNOSIS — K5900 Constipation, unspecified: Secondary | ICD-10-CM

## 2013-04-18 DIAGNOSIS — I519 Heart disease, unspecified: Secondary | ICD-10-CM | POA: Diagnosis present

## 2013-04-18 DIAGNOSIS — R739 Hyperglycemia, unspecified: Secondary | ICD-10-CM

## 2013-04-18 DIAGNOSIS — Z800 Family history of malignant neoplasm of digestive organs: Secondary | ICD-10-CM

## 2013-04-18 DIAGNOSIS — R0989 Other specified symptoms and signs involving the circulatory and respiratory systems: Secondary | ICD-10-CM | POA: Diagnosis not present

## 2013-04-18 DIAGNOSIS — H9209 Otalgia, unspecified ear: Secondary | ICD-10-CM | POA: Diagnosis not present

## 2013-04-18 DIAGNOSIS — Z8049 Family history of malignant neoplasm of other genital organs: Secondary | ICD-10-CM

## 2013-04-18 DIAGNOSIS — R079 Chest pain, unspecified: Secondary | ICD-10-CM | POA: Diagnosis present

## 2013-04-18 DIAGNOSIS — Z720 Tobacco use: Secondary | ICD-10-CM

## 2013-04-18 DIAGNOSIS — R0602 Shortness of breath: Secondary | ICD-10-CM | POA: Diagnosis not present

## 2013-04-18 DIAGNOSIS — F419 Anxiety disorder, unspecified: Secondary | ICD-10-CM

## 2013-04-18 DIAGNOSIS — M1990 Unspecified osteoarthritis, unspecified site: Secondary | ICD-10-CM

## 2013-04-18 DIAGNOSIS — Z825 Family history of asthma and other chronic lower respiratory diseases: Secondary | ICD-10-CM | POA: Diagnosis not present

## 2013-04-18 DIAGNOSIS — G4733 Obstructive sleep apnea (adult) (pediatric): Secondary | ICD-10-CM | POA: Diagnosis present

## 2013-04-18 DIAGNOSIS — Z833 Family history of diabetes mellitus: Secondary | ICD-10-CM

## 2013-04-18 DIAGNOSIS — G4700 Insomnia, unspecified: Secondary | ICD-10-CM

## 2013-04-18 DIAGNOSIS — Z85038 Personal history of other malignant neoplasm of large intestine: Secondary | ICD-10-CM | POA: Diagnosis not present

## 2013-04-18 DIAGNOSIS — I10 Essential (primary) hypertension: Secondary | ICD-10-CM | POA: Diagnosis present

## 2013-04-18 DIAGNOSIS — I493 Ventricular premature depolarization: Secondary | ICD-10-CM

## 2013-04-18 DIAGNOSIS — Z803 Family history of malignant neoplasm of breast: Secondary | ICD-10-CM | POA: Diagnosis not present

## 2013-04-18 DIAGNOSIS — Z823 Family history of stroke: Secondary | ICD-10-CM

## 2013-04-18 DIAGNOSIS — E876 Hypokalemia: Secondary | ICD-10-CM | POA: Diagnosis not present

## 2013-04-18 DIAGNOSIS — I517 Cardiomegaly: Secondary | ICD-10-CM | POA: Diagnosis not present

## 2013-04-18 DIAGNOSIS — F411 Generalized anxiety disorder: Secondary | ICD-10-CM | POA: Diagnosis not present

## 2013-04-18 DIAGNOSIS — F17200 Nicotine dependence, unspecified, uncomplicated: Secondary | ICD-10-CM | POA: Diagnosis not present

## 2013-04-18 DIAGNOSIS — M47817 Spondylosis without myelopathy or radiculopathy, lumbosacral region: Secondary | ICD-10-CM | POA: Diagnosis not present

## 2013-04-18 DIAGNOSIS — F329 Major depressive disorder, single episode, unspecified: Secondary | ICD-10-CM | POA: Diagnosis present

## 2013-04-18 DIAGNOSIS — F3289 Other specified depressive episodes: Secondary | ICD-10-CM | POA: Diagnosis not present

## 2013-04-18 DIAGNOSIS — Z8041 Family history of malignant neoplasm of ovary: Secondary | ICD-10-CM | POA: Diagnosis not present

## 2013-04-18 DIAGNOSIS — Z8249 Family history of ischemic heart disease and other diseases of the circulatory system: Secondary | ICD-10-CM | POA: Diagnosis not present

## 2013-04-18 DIAGNOSIS — K219 Gastro-esophageal reflux disease without esophagitis: Secondary | ICD-10-CM | POA: Diagnosis present

## 2013-04-18 DIAGNOSIS — E785 Hyperlipidemia, unspecified: Secondary | ICD-10-CM | POA: Diagnosis present

## 2013-04-18 DIAGNOSIS — T502X5A Adverse effect of carbonic-anhydrase inhibitors, benzothiadiazides and other diuretics, initial encounter: Secondary | ICD-10-CM | POA: Diagnosis present

## 2013-04-18 DIAGNOSIS — M47814 Spondylosis without myelopathy or radiculopathy, thoracic region: Secondary | ICD-10-CM | POA: Diagnosis not present

## 2013-04-18 DIAGNOSIS — R51 Headache: Secondary | ICD-10-CM

## 2013-04-18 DIAGNOSIS — R197 Diarrhea, unspecified: Secondary | ICD-10-CM

## 2013-04-18 DIAGNOSIS — Z8500 Personal history of malignant neoplasm of unspecified digestive organ: Secondary | ICD-10-CM

## 2013-04-18 LAB — BASIC METABOLIC PANEL
CO2: 27 mEq/L (ref 19–32)
Chloride: 96 mEq/L (ref 96–112)
Potassium: 2.8 mEq/L — ABNORMAL LOW (ref 3.5–5.1)
Sodium: 136 mEq/L (ref 135–145)

## 2013-04-18 LAB — CBC
Platelets: 173 10*3/uL (ref 150–400)
RBC: 4.47 MIL/uL (ref 3.87–5.11)
WBC: 8.4 10*3/uL (ref 4.0–10.5)

## 2013-04-18 LAB — POCT I-STAT TROPONIN I: Troponin i, poc: 0.01 ng/mL (ref 0.00–0.08)

## 2013-04-18 LAB — PRO B NATRIURETIC PEPTIDE: Pro B Natriuretic peptide (BNP): 171.5 pg/mL — ABNORMAL HIGH (ref 0–125)

## 2013-04-18 MED ORDER — ASPIRIN EC 325 MG PO TBEC
325.0000 mg | DELAYED_RELEASE_TABLET | Freq: Every day | ORAL | Status: DC
  Administered 2013-04-18 – 2013-04-23 (×6): 325 mg via ORAL
  Filled 2013-04-18 (×7): qty 1

## 2013-04-18 MED ORDER — ALBUTEROL SULFATE (5 MG/ML) 0.5% IN NEBU
2.5000 mg | INHALATION_SOLUTION | RESPIRATORY_TRACT | Status: AC
  Administered 2013-04-18: 2.5 mg via RESPIRATORY_TRACT
  Filled 2013-04-18: qty 0.5

## 2013-04-18 MED ORDER — IPRATROPIUM BROMIDE 0.02 % IN SOLN
0.5000 mg | Freq: Once | RESPIRATORY_TRACT | Status: AC
  Administered 2013-04-18: 0.5 mg via RESPIRATORY_TRACT
  Filled 2013-04-18: qty 2.5

## 2013-04-18 MED ORDER — IPRATROPIUM BROMIDE 0.02 % IN SOLN
0.5000 mg | RESPIRATORY_TRACT | Status: AC
  Administered 2013-04-18 (×2): 0.5 mg via RESPIRATORY_TRACT
  Filled 2013-04-18 (×2): qty 2.5

## 2013-04-18 MED ORDER — MORPHINE SULFATE 4 MG/ML IJ SOLN
4.0000 mg | Freq: Once | INTRAMUSCULAR | Status: AC
Start: 2013-04-18 — End: 2013-04-18
  Administered 2013-04-18: 4 mg via INTRAVENOUS
  Filled 2013-04-18: qty 1

## 2013-04-18 MED ORDER — ALBUTEROL SULFATE (5 MG/ML) 0.5% IN NEBU
2.5000 mg | INHALATION_SOLUTION | RESPIRATORY_TRACT | Status: AC
  Administered 2013-04-18 (×2): 2.5 mg via RESPIRATORY_TRACT
  Filled 2013-04-18 (×2): qty 0.5

## 2013-04-18 MED ORDER — METHYLPREDNISOLONE SODIUM SUCC 125 MG IJ SOLR
125.0000 mg | Freq: Once | INTRAMUSCULAR | Status: AC
  Administered 2013-04-18: 125 mg via INTRAVENOUS
  Filled 2013-04-18: qty 2

## 2013-04-18 NOTE — ED Notes (Signed)
Dr. Docherty at bedside.

## 2013-04-18 NOTE — ED Notes (Signed)
XR at bedside

## 2013-04-18 NOTE — ED Notes (Signed)
Patient requesting pain medication. Advised patient she will need to wait on provider to see her before receiving pain medications.

## 2013-04-18 NOTE — ED Notes (Signed)
Patient reports cough times 2 days, with some production. Patient is smoker 1-1.5ppd.

## 2013-04-18 NOTE — ED Provider Notes (Signed)
CSN: [id number]     Arrival date & time 04/18/13  2202 History   First MD Initiated Contact with Patient 04/18/13 2223     Chief Complaint  Patient presents with  . Chest Pain  . Cough   (Consider location/radiation/quality/duration/timing/severity/associated sxs/prior Treatment) Patient is a 57 y.o. female presenting with shortness of breath.  Shortness of Breath Severity:  Severe Onset quality:  Gradual Duration:  1 week Timing:  Constant Progression:  Worsening Chronicity:  Recurrent Relieved by:  Nothing Worsened by:  Exertion and movement Ineffective treatments:  Rest and inhaler Associated symptoms: chest pain, cough, ear pain, sore throat, sputum production and wheezing   Associated symptoms: no abdominal pain, no diaphoresis, no fever, no headaches, no hemoptysis, no neck pain and no vomiting   Chest pain:    Quality:  Sharp   Severity:  Moderate   Onset quality:  Sudden   Duration:  1 hour   Timing:  Constant   Progression:  Unchanged   Chronicity:  New Cough:    Cough characteristics:  Productive   Sputum characteristics:  Brown Wheezing:    Severity:  Severe   Onset quality:  Gradual   Duration:  1 week   Timing:  Constant   Progression:  Worsening   Chronicity:  Recurrent Risk factors: obesity   Risk factors: no hx of PE/DVT and no prolonged immobilization     Past Medical History  Diagnosis Date  . Hypertension   . Asthma   . COPD (chronic obstructive pulmonary disease)   . Anxiety   . Cancer   . Colon cancer   . Dyslipidemia   . Migraine headache   . Chronic bronchitis   . Uterine fibroid   . Ovarian cyst   . GERD (gastroesophageal reflux disease)   . Morbid obesity   . Obstructive sleep apnea     mild  . Chronic pain   . Chronic back pain   . Arthritis   . Osteoarthritis   . Depression    Past Surgical History  Procedure Laterality Date  . Knee arthroscopy      bil.  . Carpal tunnel release      rt  . Mouth surgery      teeth  extraction  . Cesarean section    . Subtotal colectomy    . Colonoscopy    . Tubal ligation     Family History  Problem Relation Age of Onset  . Uterine cancer Mother   . Stroke Father   . Colon cancer Maternal Uncle   . Diabetes Father   . Ovarian cancer Mother   . Hypertension Father   . Breast cancer Maternal Grandmother   . Diabetes Sister   . Asthma Child   . Asthma Child    History  Substance Use Topics  . Smoking status: Current Some Day Smoker -- 1.00 packs/day for 30 years    Types: Cigarettes  . Smokeless tobacco: Never Used     Comment: tobacco info given 07/21/2011  . Alcohol Use: No   OB History   Grav Para Term Preterm Abortions TAB SAB Ect Mult Living   2 2 2       2      Review of Systems  Constitutional: Negative for fever, chills, diaphoresis, activity change, appetite change and fatigue.  HENT: Positive for ear pain and sore throat. Negative for congestion, facial swelling and rhinorrhea.   Eyes: Negative for photophobia and discharge.  Respiratory: Positive for cough, sputum  production, shortness of breath and wheezing. Negative for hemoptysis and chest tightness.   Cardiovascular: Positive for chest pain. Negative for palpitations and leg swelling.  Gastrointestinal: Negative for nausea, vomiting, abdominal pain and diarrhea.  Endocrine: Negative for polydipsia and polyuria.  Genitourinary: Negative for dysuria, frequency, difficulty urinating and pelvic pain.  Musculoskeletal: Negative for arthralgias, back pain, neck pain and neck stiffness.  Skin: Negative for color change and wound.  Allergic/Immunologic: Negative for immunocompromised state.  Neurological: Negative for facial asymmetry, weakness, numbness and headaches.  Hematological: Does not bruise/bleed easily.  Psychiatric/Behavioral: Negative for confusion and agitation.    Allergies  Kiwi extract and Aspirin  Home Medications   No current outpatient prescriptions on file. BP  121/104  Pulse 60  Temp(Src) 98.7 F (37.1 C) (Oral)  Resp 25  Ht 4\' 9"  (1.448 m)  Wt 300 lb (136.079 kg)  BMI 64.90 kg/m2  SpO2 99%  LMP 05/24/2003 Physical Exam  Constitutional: She is oriented to person, place, and time. She appears well-developed and well-nourished. No distress.  HENT:  Head: Normocephalic and atraumatic.  Mouth/Throat: No oropharyngeal exudate.  Eyes: Pupils are equal, round, and reactive to light.  Neck: Normal range of motion. Neck supple.  Cardiovascular: Normal rate, regular rhythm and normal heart sounds.  Exam reveals no gallop and no friction rub.   No murmur heard. Pulmonary/Chest: Effort normal. No respiratory distress. She has decreased breath sounds in the right upper field, the right middle field, the right lower field, the left upper field, the left middle field and the left lower field. She has wheezes in the right upper field, the right middle field, the right lower field, the left upper field, the left middle field and the left lower field. She has no rales.  Abdominal: Soft. Bowel sounds are normal. She exhibits no distension and no mass. There is no tenderness. There is no rebound and no guarding.  Musculoskeletal: Normal range of motion. She exhibits no edema and no tenderness.  Neurological: She is alert and oriented to person, place, and time.  Skin: Skin is warm and dry.  Psychiatric: She has a normal mood and affect.    ED Course  Procedures (including critical care time) Labs Review Labs Reviewed  BASIC METABOLIC PANEL - Abnormal; Notable for the following:    Potassium 2.8 (*)    Glucose, Bld 150 (*)    GFR calc non Af Amer 74 (*)    GFR calc Af Amer 85 (*)    All other components within normal limits  PRO B NATRIURETIC PEPTIDE - Abnormal; Notable for the following:    Pro B Natriuretic peptide (BNP) 171.5 (*)    All other components within normal limits  CBC  POCT I-STAT TROPONIN I   Imaging Review Dg Chest Port 1  View  04/18/2013   CLINICAL DATA:  Chest pain, shortness of breath, cough and weakness.  EXAM: PORTABLE CHEST - 1 VIEW  COMPARISON:  Chest radiograph performed 08/18/2012  FINDINGS: The lungs are well-aerated. Vascular congestion is noted, without definite pulmonary edema. There is no evidence of focal opacification, pleural effusion or pneumothorax.  The cardiomediastinal silhouette is borderline enlarged. No acute osseous abnormalities are seen.  IMPRESSION: Vascular congestion and borderline cardiomegaly, without definite pulmonary edema.   Electronically Signed   By: Roanna Raider M.D.   On: 04/18/2013 22:43    EKG Interpretation     Ventricular Rate:  113 PR Interval:  143 QRS Duration: 80 QT Interval:  347  QTC Calculation: 476 R Axis:   38 Text Interpretation:  Sinus tachycardia Ventricular bigeminy versus frequent PVCs. Probable left atrial enlargement Low voltage, precordial leads            MDM   1. COPD exacerbation   2. Anxiety   3. Chest pain   4. Hypokalemia    Pt is a 57 y.o. female with Pmhx as above who presents with 1 week of cough, congestion, and increasing SOB, today w/ substernal CP w/ radiation to L arm, though sharp, intermittent, lasting seconds. On PE, pt with inc WOB, prolonged expiratory phase, dec airmvmt throughout that is only mildly improved w/ duoneb x3.  CXR w/ no focal opacities.  EKG w/ frequent PVCs, trop neg, BNP not significantly elevated.  Symptoms atypical for ACS, w/u not c/w CHF.  No risk factors for PE.  I feel symptoms likely COPD exacerbation.  Solumedrol, levaquin added. [facility] consulted & will admit.  Potassium replaced.         Shanna Cisco, MD 04/19/13 [id number]

## 2013-04-19 ENCOUNTER — Encounter (HOSPITAL_COMMUNITY): Payer: Self-pay | Admitting: *Deleted

## 2013-04-19 DIAGNOSIS — E876 Hypokalemia: Secondary | ICD-10-CM

## 2013-04-19 DIAGNOSIS — I517 Cardiomegaly: Secondary | ICD-10-CM

## 2013-04-19 DIAGNOSIS — G894 Chronic pain syndrome: Secondary | ICD-10-CM

## 2013-04-19 DIAGNOSIS — R079 Chest pain, unspecified: Secondary | ICD-10-CM | POA: Diagnosis present

## 2013-04-19 DIAGNOSIS — J441 Chronic obstructive pulmonary disease with (acute) exacerbation: Secondary | ICD-10-CM

## 2013-04-19 LAB — COMPREHENSIVE METABOLIC PANEL WITH GFR
ALT: 11 U/L (ref 0–35)
AST: 18 U/L (ref 0–37)
Albumin: 3.4 g/dL — ABNORMAL LOW (ref 3.5–5.2)
Alkaline Phosphatase: 79 U/L (ref 39–117)
BUN: 14 mg/dL (ref 6–23)
CO2: 25 meq/L (ref 19–32)
Calcium: 9 mg/dL (ref 8.4–10.5)
Chloride: 99 meq/L (ref 96–112)
Creatinine, Ser: 0.74 mg/dL (ref 0.50–1.10)
GFR calc Af Amer: >90 mL/min
GFR calc non Af Amer: >90 mL/min
Glucose, Bld: 145 mg/dL — ABNORMAL HIGH (ref 70–99)
Potassium: 3.3 meq/L — ABNORMAL LOW (ref 3.5–5.1)
Sodium: 138 meq/L (ref 135–145)
Total Bilirubin: 0.1 mg/dL — ABNORMAL LOW (ref 0.3–1.2)
Total Protein: 7.1 g/dL (ref 6.0–8.3)

## 2013-04-19 LAB — TROPONIN I
Troponin I: <0.3 ng/mL
Troponin I: <0.3 ng/mL

## 2013-04-19 LAB — CBC
HCT: 42 % (ref 36.0–46.0)
Hemoglobin: 13.9 g/dL (ref 12.0–15.0)
MCV: 97 fL (ref 78.0–100.0)
Platelets: 205 10*3/uL (ref 150–400)
RBC: 4.33 MIL/uL (ref 3.87–5.11)
WBC: 7 10*3/uL (ref 4.0–10.5)

## 2013-04-19 LAB — GLUCOSE, CAPILLARY: Glucose-Capillary: 168 mg/dL — ABNORMAL HIGH (ref 70–99)

## 2013-04-19 MED ORDER — TRAZODONE HCL 50 MG PO TABS
50.0000 mg | ORAL_TABLET | Freq: Every day | ORAL | Status: DC
  Administered 2013-04-19 – 2013-04-22 (×5): 50 mg via ORAL
  Filled 2013-04-19 (×6): qty 1

## 2013-04-19 MED ORDER — ONDANSETRON HCL 4 MG PO TABS: 4.0000 mg | ORAL_TABLET | Freq: Four times a day (QID) | ORAL | Status: DC | PRN

## 2013-04-19 MED ORDER — HYDROCODONE-ACETAMINOPHEN 10-325 MG PO TABS
1.0000 | ORAL_TABLET | Freq: Four times a day (QID) | ORAL | Status: DC | PRN
  Administered 2013-04-19 – 2013-04-22 (×12): 1 via ORAL
  Filled 2013-04-19 (×12): qty 1

## 2013-04-19 MED ORDER — IPRATROPIUM BROMIDE 0.02 % IN SOLN
0.5000 mg | Freq: Four times a day (QID) | RESPIRATORY_TRACT | Status: DC
  Administered 2013-04-19 – 2013-04-23 (×18): 0.5 mg via RESPIRATORY_TRACT
  Filled 2013-04-19 (×19): qty 2.5

## 2013-04-19 MED ORDER — LEVOFLOXACIN IN D5W 750 MG/150ML IV SOLN: 750.0000 mg | Freq: Once | INTRAVENOUS | Status: AC

## 2013-04-19 MED ORDER — SODIUM CHLORIDE 0.9 % IJ SOLN: 3.0000 mL | INTRAMUSCULAR | Status: DC | PRN

## 2013-04-19 MED ORDER — NICOTINE 14 MG/24HR TD PT24
14.0000 mg | MEDICATED_PATCH | Freq: Every day | TRANSDERMAL | Status: DC
  Administered 2013-04-19 – 2013-04-23 (×5): 14 mg via TRANSDERMAL
  Filled 2013-04-19 (×6): qty 1

## 2013-04-19 MED ORDER — MIRTAZAPINE 15 MG PO TABS
15.0000 mg | ORAL_TABLET | Freq: Every day | ORAL | Status: DC
  Administered 2013-04-19 – 2013-04-22 (×5): 15 mg via ORAL
  Filled 2013-04-19 (×6): qty 1

## 2013-04-19 MED ORDER — SODIUM CHLORIDE 0.9 % IV SOLN: 250.0000 mL | INTRAVENOUS | Status: DC | PRN

## 2013-04-19 MED ORDER — POTASSIUM CHLORIDE CRYS ER 20 MEQ PO TBCR
60.0000 meq | EXTENDED_RELEASE_TABLET | Freq: Once | ORAL | Status: AC
  Administered 2013-04-19: 60 meq via ORAL
  Filled 2013-04-19: qty 3

## 2013-04-19 MED ORDER — DULOXETINE HCL 60 MG PO CPEP
60.0000 mg | ORAL_CAPSULE | Freq: Every day | ORAL | Status: DC
  Administered 2013-04-19 – 2013-04-23 (×5): 60 mg via ORAL
  Filled 2013-04-19 (×5): qty 1

## 2013-04-19 MED ORDER — LORAZEPAM 1 MG PO TABS
1.0000 mg | ORAL_TABLET | Freq: Three times a day (TID) | ORAL | Status: DC
  Administered 2013-04-19 – 2013-04-23 (×13): 1 mg via ORAL
  Filled 2013-04-19 (×13): qty 1

## 2013-04-19 MED ORDER — ACETAMINOPHEN 650 MG RE SUPP: 650.0000 mg | Freq: Four times a day (QID) | RECTAL | Status: DC | PRN

## 2013-04-19 MED ORDER — LEVOFLOXACIN IN D5W 750 MG/150ML IV SOLN
750.0000 mg | INTRAVENOUS | Status: DC
  Administered 2013-04-19 – 2013-04-21 (×3): 750 mg via INTRAVENOUS
  Filled 2013-04-19 (×4): qty 150

## 2013-04-19 MED ORDER — SODIUM CHLORIDE 0.9 % IJ SOLN
3.0000 mL | Freq: Two times a day (BID) | INTRAMUSCULAR | Status: DC
  Administered 2013-04-21 (×2): 3 mL via INTRAVENOUS

## 2013-04-19 MED ORDER — ALBUTEROL SULFATE (5 MG/ML) 0.5% IN NEBU: 2.5000 mg | INHALATION_SOLUTION | RESPIRATORY_TRACT | Status: DC | PRN

## 2013-04-19 MED ORDER — HYDROCODONE-ACETAMINOPHEN 10-325 MG PO TABS
1.0000 | ORAL_TABLET | Freq: Three times a day (TID) | ORAL | Status: DC | PRN
  Administered 2013-04-19: 1 via ORAL
  Filled 2013-04-19: qty 1

## 2013-04-19 MED ORDER — METHYLPREDNISOLONE SODIUM SUCC 125 MG IJ SOLR
80.0000 mg | Freq: Three times a day (TID) | INTRAMUSCULAR | Status: DC
  Administered 2013-04-19 – 2013-04-21 (×8): 80 mg via INTRAVENOUS
  Filled 2013-04-19 (×10): qty 1.28

## 2013-04-19 MED ORDER — POTASSIUM CHLORIDE CRYS ER 20 MEQ PO TBCR
20.0000 meq | EXTENDED_RELEASE_TABLET | Freq: Every day | ORAL | Status: DC
  Administered 2013-04-19 – 2013-04-20 (×2): 20 meq via ORAL
  Filled 2013-04-19 (×2): qty 1

## 2013-04-19 MED ORDER — SERTRALINE HCL 50 MG PO TABS
50.0000 mg | ORAL_TABLET | Freq: Every day | ORAL | Status: DC
  Administered 2013-04-19 – 2013-04-23 (×5): 50 mg via ORAL
  Filled 2013-04-19 (×5): qty 1

## 2013-04-19 MED ORDER — ONDANSETRON HCL 4 MG/2ML IJ SOLN: 4.0000 mg | Freq: Four times a day (QID) | INTRAMUSCULAR | Status: DC | PRN

## 2013-04-19 MED ORDER — ALBUTEROL SULFATE (5 MG/ML) 0.5% IN NEBU
2.5000 mg | INHALATION_SOLUTION | Freq: Four times a day (QID) | RESPIRATORY_TRACT | Status: DC
  Administered 2013-04-19 – 2013-04-23 (×18): 2.5 mg via RESPIRATORY_TRACT
  Filled 2013-04-19 (×19): qty 0.5

## 2013-04-19 MED ORDER — ACETAMINOPHEN 325 MG PO TABS
650.0000 mg | ORAL_TABLET | Freq: Four times a day (QID) | ORAL | Status: DC | PRN
  Administered 2013-04-19 – 2013-04-21 (×3): 650 mg via ORAL
  Filled 2013-04-19 (×3): qty 2

## 2013-04-19 MED ORDER — BUDESONIDE 0.25 MG/2ML IN SUSP
0.2500 mg | Freq: Two times a day (BID) | RESPIRATORY_TRACT | Status: DC
  Administered 2013-04-19 – 2013-04-23 (×9): 0.25 mg via RESPIRATORY_TRACT
  Filled 2013-04-19 (×20): qty 2

## 2013-04-19 MED ORDER — POTASSIUM CHLORIDE CRYS ER 20 MEQ PO TBCR
40.0000 meq | EXTENDED_RELEASE_TABLET | ORAL | Status: AC
  Administered 2013-04-19 (×2): 40 meq via ORAL
  Filled 2013-04-19 (×2): qty 2

## 2013-04-19 MED ORDER — INFLUENZA VAC SPLIT QUAD 0.5 ML IM SUSP
0.5000 mL | INTRAMUSCULAR | Status: AC
  Administered 2013-04-20: 0.5 mL via INTRAMUSCULAR
  Filled 2013-04-19 (×2): qty 0.5

## 2013-04-19 MED ORDER — GUAIFENESIN-DM 100-10 MG/5ML PO SYRP
5.0000 mL | ORAL_SOLUTION | ORAL | Status: DC | PRN
  Administered 2013-04-19 – 2013-04-22 (×5): 5 mL via ORAL
  Filled 2013-04-19 (×5): qty 10

## 2013-04-19 MED ORDER — ENOXAPARIN SODIUM 40 MG/0.4ML ~~LOC~~ SOLN: 40.0000 mg | SUBCUTANEOUS | Status: DC

## 2013-04-19 MED ORDER — MORPHINE SULFATE 4 MG/ML IJ SOLN
4.0000 mg | Freq: Once | INTRAMUSCULAR | Status: AC
  Administered 2013-04-19: 4 mg via INTRAVENOUS
  Filled 2013-04-19: qty 1

## 2013-04-19 MED ORDER — HYDROCHLOROTHIAZIDE 25 MG PO TABS
25.0000 mg | ORAL_TABLET | Freq: Every day | ORAL | Status: DC
  Administered 2013-04-19 – 2013-04-20 (×2): 25 mg via ORAL
  Filled 2013-04-19 (×2): qty 1

## 2013-04-19 MED ORDER — PANTOPRAZOLE SODIUM 40 MG PO TBEC
40.0000 mg | DELAYED_RELEASE_TABLET | Freq: Every day | ORAL | Status: DC
  Administered 2013-04-19 – 2013-04-23 (×5): 40 mg via ORAL
  Filled 2013-04-19 (×5): qty 1

## 2013-04-19 MED ORDER — NICOTINE 14 MG/24HR TD PT24
14.0000 mg | MEDICATED_PATCH | Freq: Every day | TRANSDERMAL | Status: DC
  Filled 2013-04-19: qty 1

## 2013-04-19 MED ORDER — ENOXAPARIN SODIUM 60 MG/0.6ML ~~LOC~~ SOLN
60.0000 mg | Freq: Every day | SUBCUTANEOUS | Status: DC
  Administered 2013-04-19 – 2013-04-23 (×5): 60 mg via SUBCUTANEOUS
  Filled 2013-04-19 (×5): qty 0.6

## 2013-04-19 MED ORDER — SODIUM CHLORIDE 0.9 % IJ SOLN
3.0000 mL | Freq: Two times a day (BID) | INTRAMUSCULAR | Status: DC
  Administered 2013-04-19 – 2013-04-21 (×4): 3 mL via INTRAVENOUS

## 2013-04-19 MED ORDER — POTASSIUM CHLORIDE 10 MEQ/100ML IV SOLN
10.0000 meq | Freq: Once | INTRAVENOUS | Status: AC
  Administered 2013-04-19: 10 meq via INTRAVENOUS
  Filled 2013-04-19: qty 100

## 2013-04-19 NOTE — Progress Notes (Signed)
Echocardiogram 2D Echocardiogram has been performed.  Brooke [name] 04/19/2013, 1:20 PM

## 2013-04-19 NOTE — Progress Notes (Signed)
[facility] PROGRESS NOTE  Lylianna Fraiser ZOX:[id number] DOB: 1955/11/29 DOA: 04/18/2013 PCP: Quitman Livings, MD  Assessment/Plan:  COPD with acute exacerbation  - . Patient  started on IV Solu-Medrol, nebulized bronchodilators, nebulized budesonide, empiric Levaquin. She has somewhat improved but continues to have wheezing.  . Chest pain  - Very easily reproducible with gentle palpation, suspect the chest pain to be atypical and likely musculoskeletal in nature from repeated bouts of coughing.  - Cardiac enzymes x 3 are negative, 2 D echo obtained and the results are pending.  . Anxiety  - Lorazepam will be continued. Monitor closely   . Chronic pain syndrome  - Patient claims to have chronic back pain that is essentially unchanged. She has noted findings on exam of in the history. She is moving all 4 extremities. We will continue outpatient narcotics, avoid IV narcotics.    . Hypokalemia  - Secondary to diuretic therapy-patient appears to be an both Lasix and hydrochlorothiazide. We'll stop Lasix and discontinue hydrochlorothiazide.  - Potassium is still low, will replace and recheck BMP in am.  . Hypertension  - Continue diuretic therapy. Adjust medications depending on blood pressure trend    . Tobacco abuse  - Counseled regarding the importance of tobacco cessation, patient will be placed on a nicotine transdermal patch.     Code Status: Full code Family Communication: Discussed with patient in detail Disposition Plan: Home when stable   Consultants:  None  Procedures:  None  Antibiotics:  None  HPI/Subjective: Patient seen and examined, admitted with COPD exacerbation. She still has wheezing.  Objective: Filed Vitals:   04/19/13 1418  BP: 107/49  Pulse: 77  Temp: 98.3 F (36.8 C)  Resp: 18    Intake/Output Summary (Last 24 hours) at 04/19/13 1440 Last data filed at 04/19/13 1419  Gross per 24 hour  Intake    480 ml  Output    650 ml  Net    -170 ml   Filed Weights   04/18/13 2234 04/19/13 0138 04/19/13 0500  Weight: 136.079 kg (300 lb) 125.7 kg (277 lb 1.9 oz) 125.964 kg (277 lb 11.2 oz)    Exam:   General:  Appear in no acute distress  Cardiovascular: S1s2 RRR  Respiratory: Bilateral wheezing  Abdomen: Soft, nontender to palpation  Musculoskeletal: No edema of the lower extremities  Data Reviewed: Basic Metabolic Panel:  Recent Labs Lab 04/18/13 2220 04/19/13 0200  NA 136 138  K 2.8* 3.3*  CL 96 99  CO2 27 25  GLUCOSE 150* 145*  BUN 14 14  CREATININE 0.87 0.74  CALCIUM 9.7 9.0   Liver Function Tests:  Recent Labs Lab 04/19/13 0200  AST 18  ALT 11  ALKPHOS 79  BILITOT 0.1*  PROT 7.1  ALBUMIN 3.4*   No results found for this basename: LIPASE, AMYLASE,  in the last 168 hours No results found for this basename: AMMONIA,  in the last 168 hours CBC:  Recent Labs Lab 04/18/13 2220 04/19/13 0200  WBC 8.4 7.0  HGB 14.6 13.9  HCT 43.1 42.0  MCV 96.4 97.0  PLT 173 205   Cardiac Enzymes:  Recent Labs Lab 04/19/13 0200 04/19/13 0650 04/19/13 1314  TROPONINI <0.30 <0.30 <0.30   BNP (last 3 results)  Recent Labs  04/18/13 2220  PROBNP 171.5*   CBG:  Recent Labs Lab 04/19/13 0745  GLUCAP 168*    No results found for this or any previous visit (from the past 240 hour(s)).   Studies:  Dg Chest Port 1 View  04/18/2013   CLINICAL DATA:  Chest pain, shortness of breath, cough and weakness.  EXAM: PORTABLE CHEST - 1 VIEW  COMPARISON:  Chest radiograph performed 08/18/2012  FINDINGS: The lungs are well-aerated. Vascular congestion is noted, without definite pulmonary edema. There is no evidence of focal opacification, pleural effusion or pneumothorax.  The cardiomediastinal silhouette is borderline enlarged. No acute osseous abnormalities are seen.  IMPRESSION: Vascular congestion and borderline cardiomegaly, without definite pulmonary edema.   Electronically Signed   By: Roanna Raider M.D.   On: 04/18/2013 22:43    Scheduled Meds: . albuterol  2.5 mg Nebulization Q6H  . aspirin EC  325 mg Oral Daily  . budesonide (PULMICORT) nebulizer solution  0.25 mg Nebulization BID  . DULoxetine  60 mg Oral Daily  . enoxaparin (LOVENOX) injection  60 mg Subcutaneous Daily  . hydrochlorothiazide  25 mg Oral Daily  . [START ON 04/20/2013] influenza vac split quadrivalent PF  0.5 mL Intramuscular Tomorrow-1000  . ipratropium  0.5 mg Nebulization Q6H  . levofloxacin (LEVAQUIN) IV  750 mg Intravenous Q24H  . LORazepam  1 mg Oral TID  . methylPREDNISolone (SOLU-MEDROL) injection  80 mg Intravenous Q8H  . mirtazapine  15 mg Oral QHS  . nicotine  14 mg Transdermal Daily  . pantoprazole  40 mg Oral Daily  . potassium chloride  20 mEq Oral Daily  . sertraline  50 mg Oral Daily  . sodium chloride  3 mL Intravenous Q12H  . sodium chloride  3 mL Intravenous Q12H  . traZODone  50 mg Oral QHS   Continuous Infusions:   Principal Problem:   COPD with acute exacerbation Active Problems:   Hypertension   Hypokalemia   Tobacco abuse   Anxiety   Chronic pain syndrome   Obesity, Class III, BMI 40-49.9 (morbid obesity)   Chest pain    Time spent: 25 min    [facility]  [facility] Pager [phone]. If 7PM-7AM, please contact night-coverage at [email or web address], password [facility] 04/19/2013, 2:40 PM  LOS: 1 day

## 2013-04-19 NOTE — H&P (Signed)
PATIENT DETAILS [name] Age: 57 y.o. Sex: female Date of Birth: June 21, 1955 Admit Date: 04/18/2013 OZH:YQMVHQ,IONG, MD   CHIEF COMPLAINT:  Shortness of breath for 5 days Chest pain one day  HPI: Brooke [name] is a 57 y.o. female with a Past Medical History of COPD, ongoing tobacco use, anxiety, chronic pain syndrome who presents today with the above noted complaint. Per patient, she was in her usual state of health approximately 5 days ago when she started having cough and shortness of breath. She claimed that the cough is dry, she claims the shortness of breath is worse on exertion. She claims that the shortness of breath feels like a typical COPD/asthma exacerbation, and she tried numerous albuterol inhalers and albuterol nebulizers at home however she certainly minimal relief. For the past 2 days the shortness of breath continued to worsen. Today she claims that her cough was particularly very heavy and she was having bouts of cough. She then started having left-sided chest pain. She also started having headache. She describes the pain as sharp, with radiation at times to her left shoulder area. She denies any nausea vomiting. Does of these symptoms he presented to the emergency room, about evaluation in the emergency room she was found to be wheezing, and I was asked to admit. During my evaluation, patient is awake alert does answer all my questions appropriately. She was not in any respiratory distress, was not using any accessory muscles and was easy speaking in full sentences. Her chest pain was easily reproducible with gentle palpation.  ALLERGIES:   Allergies  Allergen Reactions  . Kiwi Extract Hives and Swelling  . Aspirin Nausea Only    Can take as long as enteric coated    PAST MEDICAL HISTORY: Past Medical History  Diagnosis Date  . Hypertension   . Asthma   . COPD (chronic obstructive pulmonary disease)   . Anxiety   . Cancer   . Colon cancer   . Dyslipidemia    . Migraine headache   . Chronic bronchitis   . Uterine fibroid   . Ovarian cyst   . GERD (gastroesophageal reflux disease)   . Morbid obesity   . Obstructive sleep apnea     mild  . Chronic pain   . Chronic back pain   . Arthritis   . Osteoarthritis   . Depression     PAST SURGICAL HISTORY: Past Surgical History  Procedure Laterality Date  . Knee arthroscopy      bil.  . Carpal tunnel release      rt  . Mouth surgery      teeth extraction  . Cesarean section    . Subtotal colectomy    . Colonoscopy    . Tubal ligation      MEDICATIONS AT HOME: Prior to Admission medications   Medication Sig Start Date End Date Taking? Authorizing Provider  albuterol (PROVENTIL HFA;VENTOLIN HFA) 108 (90 BASE) MCG/ACT inhaler Inhale 2-3 puffs into the lungs every 6 (six) hours as needed for shortness of breath. For shortness of breeath   Yes Historical Provider, MD  DULoxetine (CYMBALTA) 60 MG capsule Take 60 mg by mouth daily. 04/01/13  Yes Historical Provider, MD  furosemide (LASIX) 20 MG tablet Take 20 mg by mouth daily. 04/08/13  Yes Historical Provider, MD  hydrochlorothiazide (HYDRODIURIL) 25 MG tablet Take 25 mg by mouth daily.    Yes Historical Provider, MD  HYDROcodone-acetaminophen (NORCO) 10-325 MG per tablet Take 1 tablet by mouth every 8 (  eight) hours as needed for pain.   Yes Historical Provider, MD  ibuprofen (ADVIL,MOTRIN) 200 MG tablet Take 400 mg by mouth 2 (two) times daily. For pain.   Yes Historical Provider, MD  LORazepam (ATIVAN) 1 MG tablet Take 1 mg by mouth 3 (three) times daily. 03/10/13  Yes Historical Provider, MD  mirtazapine (REMERON) 15 MG tablet Take 15 mg by mouth at bedtime. 04/02/13  Yes Historical Provider, MD  omeprazole (PRILOSEC) 40 MG capsule Take 40 mg by mouth daily.    Yes Historical Provider, MD  Phenylephrine-DM-GG-APAP (MUCINEX FAST-MAX COLD FLU) 5-10-200-325 MG/10ML LIQD Take 20 mLs by mouth every 8 (eight) hours as needed (congestion).   Yes  Historical Provider, MD  sertraline (ZOLOFT) 50 MG tablet Take 1 tablet (50 mg total) by mouth daily. 08/23/12  Yes Maryruth Bun Rama, MD  traZODone (DESYREL) 50 MG tablet Take 50 mg by mouth at bedtime.  06/12/11  Yes Historical Provider, MD    FAMILY HISTORY: Family History  Problem Relation Age of Onset  . Uterine cancer Mother   . Stroke Father   . Colon cancer Maternal Uncle   . Diabetes Father   . Ovarian cancer Mother   . Hypertension Father   . Breast cancer Maternal Grandmother   . Diabetes Sister   . Asthma Child   . Asthma Child     SOCIAL HISTORY:  reports that she has been smoking Cigarettes.  She has a 30 pack-year smoking history. She has never used smokeless tobacco. She reports that she does not drink alcohol or use illicit drugs.  REVIEW OF SYSTEMS:  Constitutional:   No  weight loss, night sweats,  Fevers, chills, fatigue.  HEENT:    No Difficulty swallowing,Tooth/dental problems,Sore throat,  No sneezing, itching, ear ache, nasal congestion, post nasal drip,   Cardio-vascular: No Orthopnea, PND, swelling in lower extremities, anasarca,  dizziness, palpitations  GI:  No heartburn, indigestion, abdominal pain, nausea, vomiting, diarrhea, change in   bowel habits, loss of appetite  Resp: No coughing up of blood.No change in color of mucus.No chest wall deformity  Skin:  no rash or lesions.  GU:  no dysuria, change in color of urine, no urgency or frequency.  No flank pain.  Musculoskeletal: No joint pain or swelling.  No decreased range of motion.  No back pain.  Psych: No change in mood or affect. No depression or anxiety.  No memory loss.   PHYSICAL EXAM: Pulse 57, temperature 98.7 F (37.1 C), temperature source Oral, resp. rate 20, height 4\' 9"  (1.448 m), weight 136.079 kg (300 lb), last menstrual period 05/24/2003, SpO2 99.00%.  General appearance :Awake, alert, not in any distress. Speech Clear. Not toxic Looking. Obese HEENT: Atraumatic  and Normocephalic, pupils equally reactive to light and accomodation Neck: supple, no JVD. No cervical lymphadenopathy.  Chest:Good air entry bilaterally, coarse rhonchi heard in all lung zones.  CVS: S1 S2 regular, no murmurs. Exquisitely tender in the left upper chest area-very easily reproducible with gentle palpation. Abdomen: Bowel sounds present, Non tender and not distended with no gaurding, rigidity or rebound. Extremities: B/L Lower Ext shows no edema, both legs are warm to touch Neurology: Awake alert, and oriented X 3, Non focal Skin:No Rash Wounds:N/A  LABS ON ADMISSION:   Recent Labs  04/18/13 2220  NA 136  K 2.8*  CL 96  CO2 27  GLUCOSE 150*  BUN 14  CREATININE 0.87  CALCIUM 9.7   No results found for this basename:  AST, ALT, ALKPHOS, BILITOT, PROT, ALBUMIN,  in the last 72 hours No results found for this basename: LIPASE, AMYLASE,  in the last 72 hours  Recent Labs  04/18/13 2220  WBC 8.4  HGB 14.6  HCT 43.1  MCV 96.4  PLT 173   No results found for this basename: CKTOTAL, CKMB, CKMBINDEX, TROPONINI,  in the last 72 hours No results found for this basename: DDIMER,  in the last 72 hours No components found with this basename: POCBNP,    RADIOLOGIC STUDIES ON ADMISSION: Dg Chest Port 1 View  04/18/2013   CLINICAL DATA:  Chest pain, shortness of breath, cough and weakness.  EXAM: PORTABLE CHEST - 1 VIEW  COMPARISON:  Chest radiograph performed 08/18/2012  FINDINGS: The lungs are well-aerated. Vascular congestion is noted, without definite pulmonary edema. There is no evidence of focal opacification, pleural effusion or pneumothorax.  The cardiomediastinal silhouette is borderline enlarged. No acute osseous abnormalities are seen.  IMPRESSION: Vascular congestion and borderline cardiomegaly, without definite pulmonary edema.   Electronically Signed   By: Roanna Raider M.D.   On: 04/18/2013 22:43     EKG: Independently reviewed. Sinus rhythm with  PVCs  ASSESSMENT AND PLAN: Present on Admission:  . COPD with acute exacerbation - Most of the patient's symptoms are consistent with an acute exacerbation of underlying COPD. Unfortunately she continues to smoke. Patient will be admitted, started on IV Solu-Medrol, nebulized bronchodilators, nebulized budesonide, empiric Levaquin.she will be monitored closely, her clinical course will be followed and further management will depend on how she progresses.   . Chest pain - Very easily reproducible with gentle palpation, suspect the chest pain to be atypical and likely musculoskeletal in nature from repeated bouts of coughing. - She will be monitored in the telemetry unit, cardiac enzymes will be cycled. Echocardiogram will be checked for wall motion. She will be continued on aspirin.  . Anxiety - Lorazepam will be continued. Monitor closely   . Chronic pain syndrome - Patient claims to have chronic back pain that is essentially unchanged. She has noted findings on exam of in the history. She is moving all 4 extremities. We will continue outpatient narcotics, avoid IV narcotics.   . Hypokalemia - Secondary to diuretic therapy-patient appears to be an both Lasix and hydrochlorothiazide. We'll stop Lasix and discontinue hydrochlorothiazide.  - Potassium will be supplemented, it will need to be rechecked in the morning.   Marland Kitchen Hypertension - Continue diuretic therapy. Adjust medications depending on blood pressure trend   . Obesity, Class III, BMI 40-49.9 (morbid obesity) - Counseled regarding the importance of weight loss   . Tobacco abuse - Counseled regarding the importance of tobacco cessation, patient will be placed on a nicotine transdermal patch.   Further plan will depend as patient's clinical course evolves and further radiologic and laboratory data become available. Patient will be monitored closely.   DVT Prophylaxis: Prophylactic Lovenox   Code Status: Full Code  Total time  spent for admission equals 45 minutes.  [facility] Pager [phone]  If 7PM-7AM, please contact night-coverage [email or web address] Password [facility] 04/19/2013, 12:32 AM

## 2013-04-19 NOTE — Care Management Note (Addendum)
    Page 1 of 1   04/22/2013     3:30:30 PM   CARE MANAGEMENT NOTE 04/22/2013  Patient:  Brooke [name], Brooke [name]   Account Number:  [id number]  Date Initiated:  04/19/2013  Documentation initiated by:  Lanier Clam  Subjective/Objective Assessment:   57 Y/O F ADMITTED W/COPD EXAC.     Action/Plan:   FROM HOME W/DTR.HAS CANE.HAS PHARMACY.   Anticipated DC Date:  04/25/2013   Anticipated DC Plan:  HOME/SELF CARE  In-house referral  PCP / [facility]      DC Planning Services  CM consult      Choice offered to / List presented to:             Status of service:  In process, will continue to follow Medicare Important Message given?   (If response is "NO", the following Medicare IM given date fields will be blank) Date Medicare IM given:   Date Additional Medicare IM given:    Discharge Disposition:    Per UR Regulation:  Reviewed for med. necessity/level of care/duration of stay  If discussed at Long Length of Stay Meetings, dates discussed:    Comments:  04/22/13 Brooke Deringer RN,BSN NCM [id number] PCP APPT SET W/[facility],11/21 @ 9:30A SEE D/C SECTION.PT-NO F/U.02 SATS DOES NOT QUALIFY FOR HOME 02.NO ANTICIPATED D/C NEEDS.STILL PROVIDED W/HHC AGENCY LIST AS RESOURCE IF NEEDED.  04/21/13 Brooke Kagel RN,BSN NCM [id number] PROVIDED PATIENT W/PCP LISTING,ENCOURAGED TO CALL FOR PCP APPT WHILE IN HOSPITAL.PLACED TREATMENT STICKY NOTE IF HOME NEEDED WILL NEED 02 SATS DOCUMENTED.  04/19/13 Analy Bassford RN,BSN NCM [phone] ON 02.IF QUALIFIES FOR HOME 02 CAN ARRANGE.WOULD RECOMMEND PT CONS.

## 2013-04-19 NOTE — ED Notes (Signed)
Admitting at bedside. Potassium run started

## 2013-04-20 ENCOUNTER — Inpatient Hospital Stay (HOSPITAL_COMMUNITY): Payer: Medicare Other

## 2013-04-20 DIAGNOSIS — G894 Chronic pain syndrome: Secondary | ICD-10-CM

## 2013-04-20 DIAGNOSIS — F329 Major depressive disorder, single episode, unspecified: Secondary | ICD-10-CM

## 2013-04-20 LAB — BASIC METABOLIC PANEL
CO2: 26 mEq/L (ref 19–32)
Calcium: 9.6 mg/dL (ref 8.4–10.5)
Creatinine, Ser: 0.67 mg/dL (ref 0.50–1.10)
GFR calc Af Amer: >90 mL/min (ref >90)
GFR calc non Af Amer: >90 mL/min (ref >90)
Glucose, Bld: 125 mg/dL — ABNORMAL HIGH (ref 70–99)
Sodium: 135 mEq/L (ref 135–145)

## 2013-04-20 MED ORDER — MAGNESIUM SULFATE 40 MG/ML IJ SOLN
2.0000 g | Freq: Once | INTRAMUSCULAR | Status: AC
  Administered 2013-04-20: 19:00:00 2 g via INTRAVENOUS
  Filled 2013-04-20 (×2): qty 50

## 2013-04-20 NOTE — Progress Notes (Signed)
Pt having frequent bigeminal/trigeminal PVCs on monitor.  Vitals stable, asymptomatic.  Dr. Arbutus Leas notified; orders for mag sulfate IV given.  Will continue to monitor patient.

## 2013-04-20 NOTE — Progress Notes (Signed)
[facility] PROGRESS NOTE  Brooke [name] ZOX:[id number] DOB: 1955/07/13 DOA: 04/18/2013 PCP: Quitman Livings, MD  Assessment/Plan: . COPD with acute exacerbation  - Most of the patient's symptoms are consistent with an acute exacerbation of underlying COPD. Unfortunately she continues to smoke. Patient will be admitted,  -continue IV Solu-Medrol, nebulized bronchodilators, nebulized budesonide, empiric Levaquin  . Chest pain  - Very easily reproducible with gentle palpation, suspect the chest pain to be atypical and likely musculoskeletal in nature from repeated bouts of coughing.  - She will be monitored in the telemetry unit, cardiac enzymes will be cycled. -Echocardiogram --EF 60-65%, grade 1 diastolic dysfunction  -Troponins negative x3  . Anxiety  - Lorazepam will be continued. Monitor closely   . Chronic pain syndrome  - Patient claims to have chronic back pain that is essentially unchanged. She has noted findings on exam of in the history. She is moving all 4 extremities. We will continue outpatient narcotics, avoid IV narcotics.  -lumbar and thoracic spine x-rays -No bladder or bowel incontinence.  . Hypokalemia  - Secondary to diuretic therapy-patient appears to be an both Lasix and hydrochlorothiazide. We'll stop Lasix and discontinue hydrochlorothiazide.  -resolved  . Hypertension  - Continue diuretic therapy. Adjust medications depending on blood pressure trend  -d/c HCTZ as BP is soft . Obesity, Class III, BMI 40-49.9 (morbid obesity)  - Counseled regarding the importance of weight loss   . Tobacco abuse  - Counseled regarding the importance of tobacco cessation, patient will be placed on a nicotine transdermal patch.       Family Communication:   Pt at beside Disposition Plan:   Home when medically stable       Procedures/Studies: Dg Lumbar Spine Complete  03/25/2013   CLINICAL DATA:  Low back pain after fall.  EXAM: LUMBAR SPINE - COMPLETE 4+ VIEW   COMPARISON:  May 12, 2012.  FINDINGS: No fracture is noted. Minimal grade 1 anterolisthesis of L4-5 is noted to 2 mild posterior facet hypertrophy. Moderate degenerative disc disease is noted at L5-S1.  IMPRESSION: Degenerative changes as described above which are stable compared to prior exam. No acute abnormality seen in the lumbar spine.   Electronically Signed   By: Roque Lias M.D.   On: 03/25/2013 09:23   Dg Hip Complete Right  03/25/2013   CLINICAL DATA:  Fall today. Right hip common knee, and ankle pain.  EXAM: RIGHT HIP - COMPLETE 2+ VIEW  COMPARISON:  Multiple priors  FINDINGS: There is no evidence of hip fracture or dislocation. Mild degenerative changes of both hips. No acute soft tissue abnormality.  IMPRESSION: No acute findings.   Electronically Signed   By: Jerene Dilling M.D.   On: 03/25/2013 09:27   Dg Ankle Complete Right  03/25/2013   CLINICAL DATA:  Pain post trauma  EXAM: RIGHT ANKLE - COMPLETE 3+ VIEW  COMPARISON:  None.  FINDINGS: Frontal, oblique, and lateral views were obtained. There is swelling laterally. No fracture or effusion. Ankle mortise appears intact.  IMPRESSION: Swelling laterally. No fracture. Mortise intact.   Electronically Signed   By: Bretta Bang M.D.   On: 03/25/2013 09:21   Dg Chest Port 1 View  04/18/2013   CLINICAL DATA:  Chest pain, shortness of breath, cough and weakness.  EXAM: PORTABLE CHEST - 1 VIEW  COMPARISON:  Chest radiograph performed 08/18/2012  FINDINGS: The lungs are well-aerated. Vascular congestion is noted, without definite pulmonary edema. There is no evidence of focal opacification, pleural effusion or  pneumothorax.  The cardiomediastinal silhouette is borderline enlarged. No acute osseous abnormalities are seen.  IMPRESSION: Vascular congestion and borderline cardiomegaly, without definite pulmonary edema.   Electronically Signed   By: Roanna Raider M.D.   On: 04/18/2013 22:43   Dg Knee Complete 4 Views  Right  03/25/2013   CLINICAL DATA:  Pain post trauma  EXAM: RIGHT KNEE - COMPLETE 4+ VIEW  COMPARISON:  None.  FINDINGS: Frontal, lateral, and bilateral oblique views were obtained. There is no fracture, dislocation, or effusion. There is rather marked narrowing medially. There is moderate patellofemoral joint space narrowing. There is spurring in all compartments. No erosive change.  IMPRESSION: Osteoarthritic change, most marked medially. No fracture or effusion.   Electronically Signed   By: Bretta Bang M.D.   On: 03/25/2013 09:23         Subjective: Patient states that she is breathing minimally better. Denies any fevers, chills, chest discomfort, nausea, vomiting. She complains of low back pain.  Objective: Filed Vitals:   04/20/13 0735 04/20/13 1425 04/20/13 1648 04/20/13 1650  BP:  98/54    Pulse:  68  110  Temp:  97.9 F (36.6 C)    TempSrc:  Oral    Resp:  18    Height:      Weight:      SpO2: 100% 100% 79% 100%    Intake/Output Summary (Last 24 hours) at 04/20/13 1725 Last data filed at 04/20/13 1500  Gross per 24 hour  Intake    380 ml  Output      0 ml  Net    380 ml   Weight change: 14.301 kg (31 lb 8.4 oz) Exam:   General:  Pt is alert, follows commands appropriately, not in acute distress  HEENT: No icterus, No thrush, No neck mass, [place]/AT  Cardiovascular: RRR, S1/S2, no rubs, no gallops  Respiratory: Minimal expiratory wheeze. Good air movement.  Abdomen: Soft/+BS, non tender, non distended, no guarding  Extremities: No edema, No lymphangitis, No petechiae, No rashes, no synovitis  Data Reviewed: Basic Metabolic Panel:  Recent Labs Lab 04/18/13 2220 04/19/13 0200 04/20/13 0501  NA 136 138 135  K 2.8* 3.3* 4.6  CL 96 99 99  CO2 27 25 26   GLUCOSE 150* 145* 125*  BUN 14 14 13   CREATININE 0.87 0.74 0.67  CALCIUM 9.7 9.0 9.6   Liver Function Tests:  Recent Labs Lab 04/19/13 0200  AST 18  ALT 11  ALKPHOS 79  BILITOT 0.1*  PROT  7.1  ALBUMIN 3.4*   No results found for this basename: LIPASE, AMYLASE,  in the last 168 hours No results found for this basename: AMMONIA,  in the last 168 hours CBC:  Recent Labs Lab 04/18/13 2220 04/19/13 0200  WBC 8.4 7.0  HGB 14.6 13.9  HCT 43.1 42.0  MCV 96.4 97.0  PLT 173 205   Cardiac Enzymes:  Recent Labs Lab 04/19/13 0200 04/19/13 0650 04/19/13 1314  TROPONINI <0.30 <0.30 <0.30   BNP: No components found with this basename: POCBNP,  CBG:  Recent Labs Lab 04/19/13 0745 04/20/13 0747  GLUCAP 168* 134*    No results found for this or any previous visit (from the past 240 hour(s)).   Scheduled Meds: . albuterol  2.5 mg Nebulization Q6H  . aspirin EC  325 mg Oral Daily  . budesonide (PULMICORT) nebulizer solution  0.25 mg Nebulization BID  . DULoxetine  60 mg Oral Daily  . enoxaparin (LOVENOX) injection  60 mg Subcutaneous Daily  . hydrochlorothiazide  25 mg Oral Daily  . ipratropium  0.5 mg Nebulization Q6H  . levofloxacin (LEVAQUIN) IV  750 mg Intravenous Q24H  . LORazepam  1 mg Oral TID  . methylPREDNISolone (SOLU-MEDROL) injection  80 mg Intravenous Q8H  . mirtazapine  15 mg Oral QHS  . nicotine  14 mg Transdermal Daily  . pantoprazole  40 mg Oral Daily  . potassium chloride  20 mEq Oral Daily  . sertraline  50 mg Oral Daily  . sodium chloride  3 mL Intravenous Q12H  . sodium chloride  3 mL Intravenous Q12H  . traZODone  50 mg Oral QHS   Continuous Infusions:    Halla Chopp, DO  [facility] Pager [phone]  If 7PM-7AM, please contact night-coverage [email or web address] Password [email or web address] 04/20/2013, 5:26 PM   LOS: 2 days

## 2013-04-21 LAB — BASIC METABOLIC PANEL
BUN: 14 mg/dL (ref 6–23)
Calcium: 8.8 mg/dL (ref 8.4–10.5)
GFR calc non Af Amer: >90 mL/min (ref >90)
Glucose, Bld: 145 mg/dL — ABNORMAL HIGH (ref 70–99)
Sodium: 137 mEq/L (ref 135–145)

## 2013-04-21 MED ORDER — METHYLPREDNISOLONE SODIUM SUCC 125 MG IJ SOLR
80.0000 mg | Freq: Two times a day (BID) | INTRAMUSCULAR | Status: DC
  Administered 2013-04-22 (×2): 80 mg via INTRAVENOUS
  Filled 2013-04-21 (×3): qty 1.28

## 2013-04-21 MED ORDER — LEVOFLOXACIN 750 MG PO TABS
750.0000 mg | ORAL_TABLET | ORAL | Status: DC
  Administered 2013-04-22 (×2): 750 mg via ORAL
  Filled 2013-04-21 (×3): qty 1

## 2013-04-21 NOTE — Progress Notes (Signed)
Patient had bowel movement, flushed toilet before nurse was able to visualize.  Patient's urine hat had also been removed from toilet.  Explained to patient about the need to measure her input and output.  Patient and patient's daughter voiced understanding.  Will continue to monitor.

## 2013-04-21 NOTE — Progress Notes (Signed)
[facility] PROGRESS NOTE  Brooke [name] ZOX:[id number] DOB: 05/08/1956 DOA: 04/18/2013 PCP: Quitman Livings, MD  Assessment/Plan: COPD with acute exacerbation  - Most of the patient's symptoms are consistent with an acute exacerbation of underlying COPD. Unfortunately she continues to smoke. Patient will be admitted,  -continue IV Solu-Medrol, nebulized bronchodilators, nebulized budesonide, empiric Levaquin  -Decrease IV Solu-Medrol to 80 mg twice a day -Change Levaquin to po . Chest pain  - Very easily reproducible with gentle palpation, suspect the chest pain to be atypical and likely musculoskeletal in nature from repeated bouts of coughing.  - She will be monitored in the telemetry unit, cardiac enzymes will be cycled. -Echocardiogram --EF 60-65%, grade 1 diastolic dysfunction  -Troponins negative x3  . Anxiety  - Lorazepam will be continued. Monitor closely   . Chronic pain syndrome  - Patient claims to have chronic back pain that is essentially unchanged. She has noted findings on exam of in the history. She is moving all 4 extremities. We will continue outpatient narcotics, avoid IV narcotics.  -lumbar and thoracic spine x-rays--negative for acute findings  -No bladder or bowel incontinence.  . Hypokalemia  - Secondary to diuretic therapy-patient appears to be an both Lasix and hydrochlorothiazide. We'll stop Lasix and discontinue hydrochlorothiazide.  -resolved  -Check magnesium  . Hypertension  - Continue diuretic therapy. Adjust medications depending on blood pressure trend  -d/c HCTZ as BP is soft -Blood pressure has improved, patient is normotensive  . Obesity, Class III, BMI 40-49.9 (morbid obesity)  - Counseled regarding the importance of weight loss   . Tobacco abuse  - Counseled regarding the importance of tobacco cessation, patient will be placed on a nicotine transdermal patch.       Procedures/Studies: Dg Thoracic Spine 2 View  04/20/2013   CLINICAL  DATA:  Back pain  EXAM: THORACIC SPINE - 2 VIEW  COMPARISON:  None.  FINDINGS: Multilevel osteophytic changes are noted. No compression deformities are seen. No paraspinal mass lesion is noted.  IMPRESSION: Degenerative change without acute abnormality.   Electronically Signed   By: Alcide Clever M.D.   On: 04/20/2013 19:02   Dg Lumbar Spine 2-3 Views  04/20/2013   CLINICAL DATA:  Low back pain  EXAM: LUMBAR SPINE - 2-3 VIEW  COMPARISON:  03/25/2013  FINDINGS: Five lumbar type vertebral bodies are well visualized. Anterolisthesis of L4 on L5 is again noted. This is of a degenerative nature. Disc space narrowing at L5-S1 is again seen.  IMPRESSION: Stable degenerative change.  No acute abnormality is noted.   Electronically Signed   By: Alcide Clever M.D.   On: 04/20/2013 19:02   Dg Lumbar Spine Complete  03/25/2013   CLINICAL DATA:  Low back pain after fall.  EXAM: LUMBAR SPINE - COMPLETE 4+ VIEW  COMPARISON:  May 12, 2012.  FINDINGS: No fracture is noted. Minimal grade 1 anterolisthesis of L4-5 is noted to 2 mild posterior facet hypertrophy. Moderate degenerative disc disease is noted at L5-S1.  IMPRESSION: Degenerative changes as described above which are stable compared to prior exam. No acute abnormality seen in the lumbar spine.   Electronically Signed   By: Roque Lias M.D.   On: 03/25/2013 09:23   Dg Hip Complete Right  03/25/2013   CLINICAL DATA:  Fall today. Right hip common knee, and ankle pain.  EXAM: RIGHT HIP - COMPLETE 2+ VIEW  COMPARISON:  Multiple priors  FINDINGS: There is no evidence of hip fracture or dislocation. Mild degenerative changes of  both hips. No acute soft tissue abnormality.  IMPRESSION: No acute findings.   Electronically Signed   By: Jerene Dilling M.D.   On: 03/25/2013 09:27   Dg Ankle Complete Right  03/25/2013   CLINICAL DATA:  Pain post trauma  EXAM: RIGHT ANKLE - COMPLETE 3+ VIEW  COMPARISON:  None.  FINDINGS: Frontal, oblique, and lateral views were  obtained. There is swelling laterally. No fracture or effusion. Ankle mortise appears intact.  IMPRESSION: Swelling laterally. No fracture. Mortise intact.   Electronically Signed   By: Bretta Bang M.D.   On: 03/25/2013 09:21   Dg Chest Port 1 View  04/18/2013   CLINICAL DATA:  Chest pain, shortness of breath, cough and weakness.  EXAM: PORTABLE CHEST - 1 VIEW  COMPARISON:  Chest radiograph performed 08/18/2012  FINDINGS: The lungs are well-aerated. Vascular congestion is noted, without definite pulmonary edema. There is no evidence of focal opacification, pleural effusion or pneumothorax.  The cardiomediastinal silhouette is borderline enlarged. No acute osseous abnormalities are seen.  IMPRESSION: Vascular congestion and borderline cardiomegaly, without definite pulmonary edema.   Electronically Signed   By: Roanna Raider M.D.   On: 04/18/2013 22:43   Dg Knee Complete 4 Views Right  03/25/2013   CLINICAL DATA:  Pain post trauma  EXAM: RIGHT KNEE - COMPLETE 4+ VIEW  COMPARISON:  None.  FINDINGS: Frontal, lateral, and bilateral oblique views were obtained. There is no fracture, dislocation, or effusion. There is rather marked narrowing medially. There is moderate patellofemoral joint space narrowing. There is spurring in all compartments. No erosive change.  IMPRESSION: Osteoarthritic change, most marked medially. No fracture or effusion.   Electronically Signed   By: Bretta Bang M.D.   On: 03/25/2013 09:23         Subjective: Patient states that she is breathing about 50% better. Denies any fevers, chills, chest discomfort, shortness breath, nausea, vomiting, diarrhea, abdominal pain, dysuria, hematuria, abdominal pain. She continues to complain of back pain. Denies any bladder or bowel incontinence. Denies any falls.  Objective: Filed Vitals:   04/20/13 2036 04/21/13 0602 04/21/13 0828 04/21/13 1359  BP: 118/75 126/62  134/65  Pulse: 107 84  108  Temp: 97.6 F (36.4 C) 97.8 F  (36.6 C)  97.7 F (36.5 C)  TempSrc: Oral Oral  Oral  Resp: 18 18  16   Height:      Weight:  129.502 kg (285 lb 8 oz)    SpO2: 100% 100% 100% 97%    Intake/Output Summary (Last 24 hours) at 04/21/13 1928 Last data filed at 04/21/13 1804  Gross per 24 hour  Intake   2521 ml  Output      0 ml  Net   2521 ml   Weight change: 1.802 kg (3 lb 15.6 oz) Exam:   General:  Pt is alert, follows commands appropriately, not in acute distress  HEENT: No icterus, No thrush,  [place]/AT  Cardiovascular: RRR, S1/S2, no rubs, no gallops  Respiratory: Bibasilar minimal wheeze. Good air movement. No rhonchi.  Abdomen: Soft/+BS, non tender, non distended, no guarding  Extremities: trace LE edema, No lymphangitis, No petechiae, No rashes, no synovitis  Data Reviewed: Basic Metabolic Panel:  Recent Labs Lab 04/18/13 2220 04/19/13 0200 04/20/13 0501 04/21/13 0445  NA 136 138 135 137  K 2.8* 3.3* 4.6 4.9  CL 96 99 99 102  CO2 27 25 26 26   GLUCOSE 150* 145* 125* 145*  BUN 14 14 13 14   CREATININE 0.87  0.74 0.67 0.61  CALCIUM 9.7 9.0 9.6 8.8   Liver Function Tests:  Recent Labs Lab 04/19/13 0200  AST 18  ALT 11  ALKPHOS 79  BILITOT 0.1*  PROT 7.1  ALBUMIN 3.4*   No results found for this basename: LIPASE, AMYLASE,  in the last 168 hours No results found for this basename: AMMONIA,  in the last 168 hours CBC:  Recent Labs Lab 04/18/13 2220 04/19/13 0200  WBC 8.4 7.0  HGB 14.6 13.9  HCT 43.1 42.0  MCV 96.4 97.0  PLT 173 205   Cardiac Enzymes:  Recent Labs Lab 04/19/13 0200 04/19/13 0650 04/19/13 1314  TROPONINI <0.30 <0.30 <0.30   BNP: No components found with this basename: POCBNP,  CBG:  Recent Labs Lab 04/19/13 0745 04/20/13 0747 04/21/13 0734  GLUCAP 168* 134* 141*    No results found for this or any previous visit (from the past 240 hour(s)).   Scheduled Meds: . albuterol  2.5 mg Nebulization Q6H  . aspirin EC  325 mg Oral Daily  . budesonide  (PULMICORT) nebulizer solution  0.25 mg Nebulization BID  . DULoxetine  60 mg Oral Daily  . enoxaparin (LOVENOX) injection  60 mg Subcutaneous Daily  . ipratropium  0.5 mg Nebulization Q6H  . levofloxacin (LEVAQUIN) IV  750 mg Intravenous Q24H  . LORazepam  1 mg Oral TID  . methylPREDNISolone (SOLU-MEDROL) injection  80 mg Intravenous Q8H  . mirtazapine  15 mg Oral QHS  . nicotine  14 mg Transdermal Daily  . pantoprazole  40 mg Oral Daily  . sertraline  50 mg Oral Daily  . sodium chloride  3 mL Intravenous Q12H  . sodium chloride  3 mL Intravenous Q12H  . traZODone  50 mg Oral QHS   Continuous Infusions:    Jennise Both, DO  [facility] Pager [phone]  If 7PM-7AM, please contact night-coverage [email or web address] Password [email or web address] 04/21/2013, 7:28 PM   LOS: 3 days

## 2013-04-22 DIAGNOSIS — F17200 Nicotine dependence, unspecified, uncomplicated: Secondary | ICD-10-CM

## 2013-04-22 LAB — BASIC METABOLIC PANEL
BUN: 18 mg/dL (ref 6–23)
Calcium: 8.9 mg/dL (ref 8.4–10.5)
GFR calc non Af Amer: >90 mL/min (ref >90)
Glucose, Bld: 110 mg/dL — ABNORMAL HIGH (ref 70–99)

## 2013-04-22 MED ORDER — PREDNISONE 50 MG PO TABS
60.0000 mg | ORAL_TABLET | Freq: Every day | ORAL | Status: DC
  Administered 2013-04-23: 60 mg via ORAL
  Filled 2013-04-22 (×2): qty 1

## 2013-04-22 MED ORDER — HYDROCODONE-ACETAMINOPHEN 10-325 MG PO TABS
1.0000 | ORAL_TABLET | Freq: Three times a day (TID) | ORAL | Status: DC
  Administered 2013-04-22 – 2013-04-23 (×2): 1 via ORAL
  Filled 2013-04-22 (×2): qty 1

## 2013-04-22 NOTE — Evaluation (Addendum)
Physical Therapy Evaluation Patient Details [name] MRN: [id number] DOB: 02-15-1956 Today's Date: 04/22/2013 Time: 4098-1191 PT Time Calculation (min): 17 min  SATURATION QUALIFICATIONS: (This note is used to comply with regulatory documentation for home oxygen)  Patient Saturations on Room Air at Rest = 100%  Patient Saturations on Room Air while Ambulating = 92%    PT Assessment / Plan / Recommendation History of Present Illness  57 yo female admitted with COPD exacerbation. Hx of COPD, anxiety, chronic pain syndrome  Clinical Impression  On eval, pt required Min guard assist for mobility-able to ambulate ~60 feet while pushing IV pole. O2 sats reading fluctuated between 92% and 99% on RA. HR increased to 128 bpm and dyspnea was 3/4 with ambulation. Recommend daily mobility with nursing supervision.     PT Assessment  Patient needs continued PT services    Follow Up Recommendations  No PT follow up;Supervision - Intermittent    Does the patient have the potential to tolerate intense rehabilitation      Barriers to Discharge        Equipment Recommendations  None recommended by PT    Recommendations for Other Services OT consult   Frequency Min 3X/week    Precautions / Restrictions Precautions Precautions: Fall Restrictions Weight Bearing Restrictions: No   Pertinent Vitals/Pain Low back/buttock pain 10/10. Pt states she already made RN aware.       Mobility  Bed Mobility Bed Mobility: Supine to Sit;Sit to Supine Supine to Sit: 6: Modified independent (Device/Increase time) Sit to Supine: 6: Modified independent (Device/Increase time) Transfers Transfers: Sit to Stand;Stand to Sit Sit to Stand: 6: Modified independent (Device/Increase time);From bed Stand to Sit: 6: Modified independent (Device/Increase time);To bed Ambulation/Gait Ambulation/Gait Assistance: 4: Min guard Ambulation Distance (Feet): 60 Feet Ambulation/Gait Assistance Details:  dyspnea 3/4. audible wheezing noted as well. fatigues fairly easily. 1 brief standing rest break for pursed lip breathing Gait Pattern: Step-through pattern;Decreased stride length    Exercises     PT Diagnosis: Difficulty walking  PT Problem List: Decreased mobility;Pain;Decreased activity tolerance PT Treatment Interventions: Gait training;Stair training;Functional mobility training;Therapeutic activities;Therapeutic exercise;Patient/family education     PT Goals(Current goals can be found in the care plan section) Acute Rehab PT Goals Patient Stated Goal: less pain PT Goal Formulation: With patient Time For Goal Achievement: 04/29/13 Potential to Achieve Goals: Good  Visit Information  Last PT Received On: 04/22/13 Assistance Needed: +1 History of Present Illness: 57 yo female admitted with COPD exacerbation. Hx of COPD, anxiety, chronic pain syndrome       Prior Functioning  Home Living Family/patient expects to be discharged to:: Private residence Living Arrangements: Spouse/significant other (female friend off and on) Available Help at Discharge: Family;Available PRN/intermittently Type of Home: House Home Access: Stairs to enter [employer] of Steps: 5 Entrance Stairs-Rails: Right Home Layout: One level Home Equipment: None Prior Function Level of Independence: Independent Comments: friend/family bring food.  pt has tub/showe Communication Communication: No difficulties    Cognition  Cognition Arousal/Alertness: Awake/alert Behavior During Therapy: WFL for tasks assessed/performed Overall Cognitive Status: Within Functional Limits for tasks assessed    Extremity/Trunk Assessment Upper Extremity Assessment Upper Extremity Assessment: Overall WFL for tasks assessed Lower Extremity Assessment Lower Extremity Assessment: Overall WFL for tasks assessed Cervical / Trunk Assessment Cervical / Trunk Assessment: Normal   Balance    End of Session PT - End  of Session Activity Tolerance: Patient limited by fatigue (limited by dyspnea) Patient left: in bed;with call  bell/phone within reach  GP     Rebeca Alert, MPT Pager: [phone]

## 2013-04-22 NOTE — Discharge Summary (Signed)
Physician Discharge Summary  Brooke [name] ZOX:[id number] DOB: Aug 06, 1955 DOA: 04/18/2013  PCP: Quitman Livings, MD  Admit date: 04/18/2013 Discharge date: 04/23/2013 Recommendations for Outpatient Follow-up:  1. Pt will need to follow up with PCP in 2 weeks post discharge 2. Please obtain BMP to evaluate electrolytes and kidney function 3. Please also check CBC to evaluate Hg and Hct levels   Discharge Diagnoses:  Principal Problem:   COPD with acute exacerbation Active Problems:   Hypertension   Hypokalemia   Tobacco abuse   Anxiety   Chronic pain syndrome   Obesity, Class III, BMI 40-49.9 (morbid obesity)   Chest pain COPD with acute exacerbation  - Most of the patient's symptoms are consistent with an acute exacerbation of underlying COPD. Unfortunately she continues to smoke.  -continue IV Solu-Medrol, nebulized bronchodilators, nebulized budesonide, empiric Levaquin  -Decrease IV Solu-Medrol to 80 mg twice a day  -Change Levaquin to po  -change to po prednisone am 04/23/13  -Prednisone 60 mg daily, decrease by 10 mg daily -Patient's respiratory status continued to improve--there was no further wheezing -Ambulatory pulse oximetry did not show any oxygen desaturation -The patient will be discharged with a long-acting beta agonist--symbicort . Chest pain  - Very easily reproducible with gentle palpation, suspect the chest pain to be atypical and likely musculoskeletal in nature from repeated bouts of coughing.  - She will be monitored in the telemetry unit, cardiac enzymes will be cycled. -Echocardiogram --EF 60-65%, grade 1 diastolic dysfunction  -Troponins negative x3  . Anxiety  - Lorazepam will be continued. Monitor closely   . Chronic pain syndrome  - Patient claims to have chronic back pain that is essentially unchanged. -She is moving all 4 extremities without incontinence.  -I have told the patient that I will NOT give any IV opioids  -continue outpt Norco  10-325-->schedule it q8 hrs as pt has been asking for it 3times daily for past 4 days  -lumbar and thoracic spine x-rays--negative for acute findings  -No bladder or bowel incontinence.  -Rx Norco 10/325, #30, one po q 8hrs prn pain, no RF . Hypokalemia  - Secondary to diuretic therapy-patient appears to be an both Lasix and hydrochlorothiazide. We'll stop Lasix and discontinue hydrochlorothiazide.  -resolved  -Check magnesium--2.6  . Hypertension  - Continue diuretic therapy. Adjust medications depending on blood pressure trend  -d/c HCTZ as BP is soft  -Blood pressure has improved, patient is normotensive  -HCTZ will not be restarted as the patient's blood pressure remained stable off of antihypertensive medications  . Obesity, Class III, BMI 40-49.9 (morbid obesity)  - Counseled regarding the importance of weight loss   . Tobacco abuse  - Counseled regarding the importance of tobacco cessation, patient will be placed on a nicotine transdermal patch.  Family Communication: Husband at beside  Disposition Plan: Home 04/23/13 if stable  Antibiotics:  Levofloxacin 04/19/2013>>>   Discharge Condition: Stable  Disposition:  Follow-up Information   Follow up with Jeanann Lewandowsky, MD On 04/29/2013. (@ 9:30a/bring in medicine bottles/$20 visit)    Specialty:  Internal Medicine   Contact information:   [address] [place] [address] [phone]       Diet: Heart healthy Wt Readings from Last 3 Encounters:  04/23/13 132.4 kg (291 lb 14.2 oz)  03/25/13 122.471 kg (270 lb)  01/19/13 113.399 kg (250 lb)    History of present illness:  57 y.o. female with a Past Medical History of COPD, ongoing tobacco use, anxiety, chronic pain syndrome  who presents today with the above noted complaint. Per patient, she was in her usual state of health approximately 5 days ago when she started having cough and shortness of breath. She claimed that the cough is dry, she claims the  shortness of breath is worse on exertion. She claims that the shortness of breath feels like a typical COPD/asthma exacerbation, and she tried numerous albuterol inhalers and albuterol nebulizers at home however she certainly minimal relief. For the past 2 days the shortness of breath continued to worsen. Today she claims that her cough was particularly very heavy and she was having bouts of cough. She then started having left-sided chest pain. She also started having headache. She describes the pain as sharp, with radiation at times to her left shoulder area. She denies any nausea vomiting. Does of these symptoms he presented to the emergency room, about evaluation in the emergency room she was found to be wheezing, and I was asked to admit.  During my evaluation, patient is awake alert does answer all my questions appropriately. She was not in any respiratory distress, was not using any accessory muscles and was easy speaking in full sentences in ED. Her chest pain was easily reproducible with gentle palpation. Troponins were cycled and were negative. The patient was started on intravenous antibiotics and intravenous Solu-Medrol. Aerosolized albuterol and Atrovent were started. The patient gradually improved. Her intravenous steroids were converted to oral steroids. IV antibiotics were changed to by mouth.       Discharge Exam: Filed Vitals:   04/23/13 0611  BP: 139/94  Pulse: 99  Temp: 98.2 F (36.8 C)  Resp: 16   Filed Vitals:   04/22/13 2001 04/22/13 2229 04/23/13 0611 04/23/13 0900  BP:  126/72 139/94   Pulse:  98 99   Temp:  97.7 F (36.5 C) 98.2 F (36.8 C)   TempSrc:  Oral Oral   Resp:  16 16   Height:      Weight:   132.4 kg (291 lb 14.2 oz)   SpO2: 98% 100% 100% 98%   General: A&O x 3, NAD, pleasant, cooperative Cardiovascular: RRR, no rub, no gallop, no S3 Respiratory: diminished breath sounds without any wheezing. Good air movement. Abdomen:soft, nontender, nondistended,  positive bowel sounds Extremities: trace LE edema, No lymphangitis, no petechiae  Discharge Instructions      Discharge Orders   Future Appointments Provider Department Dept Phone   04/29/2013 9:30 AM [facility] Covering Provider [facility] [phone]   Future Orders Complete By Expires   Diet - low sodium heart healthy  As directed    Discharge instructions  As directed    Comments:     Start taking prednisone 60mg  (6 tabs) every morning starting 04/24/13 and decrease by one tablet daily Use symbicort twice a day even when you are breathing good   Increase activity slowly  As directed        Medication List    STOP taking these medications       hydrochlorothiazide 25 MG tablet  Commonly known as:  HYDRODIURIL     ibuprofen 200 MG tablet  Commonly known as:  ADVIL,MOTRIN      TAKE these medications       albuterol 108 (90 BASE) MCG/ACT inhaler  Commonly known as:  PROVENTIL HFA;VENTOLIN HFA  Inhale 2 puffs into the lungs every 6 (six) hours as needed for shortness of breath. For shortness of breeath     budesonide-formoterol  160-4.5 MCG/ACT inhaler  Commonly known as:  SYMBICORT  Inhale 2 puffs into the lungs 2 (two) times daily.     DULoxetine 60 MG capsule  Commonly known as:  CYMBALTA  Take 60 mg by mouth daily.     furosemide 20 MG tablet  Commonly known as:  LASIX  Take 20 mg by mouth daily.     HYDROcodone-acetaminophen 10-325 MG per tablet  Commonly known as:  NORCO  Take 1 tablet by mouth every 8 (eight) hours as needed.     levofloxacin 750 MG tablet  Commonly known as:  LEVAQUIN  Take 1 tablet (750 mg total) by mouth daily. Start 04/24/13     LORazepam 1 MG tablet  Commonly known as:  ATIVAN  Take 1 mg by mouth 3 (three) times daily.     mirtazapine 15 MG tablet  Commonly known as:  REMERON  Take 15 mg by mouth at bedtime.     MUCINEX FAST-MAX COLD FLU 5-10-200-325 MG/10ML Liqd  Generic drug:   Phenylephrine-DM-GG-APAP  Take 20 mLs by mouth every 8 (eight) hours as needed (congestion).     omeprazole 40 MG capsule  Commonly known as:  PRILOSEC  Take 40 mg by mouth daily.     predniSONE 10 MG tablet  Commonly known as:  DELTASONE  Take 6 tablets (60 mg total) by mouth daily with breakfast. Starting 04/24/13, take 6 tabs every morning and decrease by one tablet daily     sertraline 50 MG tablet  Commonly known as:  ZOLOFT  Take 1 tablet (50 mg total) by mouth daily.     traZODone 50 MG tablet  Commonly known as:  DESYREL  Take 50 mg by mouth at bedtime.         The results of significant diagnostics from this hospitalization (including imaging, microbiology, ancillary and laboratory) are listed below for reference.    Significant Diagnostic Studies: Dg Thoracic Spine 2 View  04/20/2013   CLINICAL DATA:  Back pain  EXAM: THORACIC SPINE - 2 VIEW  COMPARISON:  None.  FINDINGS: Multilevel osteophytic changes are noted. No compression deformities are seen. No paraspinal mass lesion is noted.  IMPRESSION: Degenerative change without acute abnormality.   Electronically Signed   By: Alcide Clever M.D.   On: 04/20/2013 19:02   Dg Lumbar Spine 2-3 Views  04/20/2013   CLINICAL DATA:  Low back pain  EXAM: LUMBAR SPINE - 2-3 VIEW  COMPARISON:  03/25/2013  FINDINGS: Five lumbar type vertebral bodies are well visualized. Anterolisthesis of L4 on L5 is again noted. This is of a degenerative nature. Disc space narrowing at L5-S1 is again seen.  IMPRESSION: Stable degenerative change.  No acute abnormality is noted.   Electronically Signed   By: Alcide Clever M.D.   On: 04/20/2013 19:02   Dg Lumbar Spine Complete  03/25/2013   CLINICAL DATA:  Low back pain after fall.  EXAM: LUMBAR SPINE - COMPLETE 4+ VIEW  COMPARISON:  May 12, 2012.  FINDINGS: No fracture is noted. Minimal grade 1 anterolisthesis of L4-5 is noted to 2 mild posterior facet hypertrophy. Moderate degenerative disc disease  is noted at L5-S1.  IMPRESSION: Degenerative changes as described above which are stable compared to prior exam. No acute abnormality seen in the lumbar spine.   Electronically Signed   By: Roque Lias M.D.   On: 03/25/2013 09:23   Dg Hip Complete Right  03/25/2013   CLINICAL DATA:  Fall today. Right hip common  knee, and ankle pain.  EXAM: RIGHT HIP - COMPLETE 2+ VIEW  COMPARISON:  Multiple priors  FINDINGS: There is no evidence of hip fracture or dislocation. Mild degenerative changes of both hips. No acute soft tissue abnormality.  IMPRESSION: No acute findings.   Electronically Signed   By: Jerene Dilling M.D.   On: 03/25/2013 09:27   Dg Ankle Complete Right  03/25/2013   CLINICAL DATA:  Pain post trauma  EXAM: RIGHT ANKLE - COMPLETE 3+ VIEW  COMPARISON:  None.  FINDINGS: Frontal, oblique, and lateral views were obtained. There is swelling laterally. No fracture or effusion. Ankle mortise appears intact.  IMPRESSION: Swelling laterally. No fracture. Mortise intact.   Electronically Signed   By: Bretta Bang M.D.   On: 03/25/2013 09:21   Dg Chest Port 1 View  04/18/2013   CLINICAL DATA:  Chest pain, shortness of breath, cough and weakness.  EXAM: PORTABLE CHEST - 1 VIEW  COMPARISON:  Chest radiograph performed 08/18/2012  FINDINGS: The lungs are well-aerated. Vascular congestion is noted, without definite pulmonary edema. There is no evidence of focal opacification, pleural effusion or pneumothorax.  The cardiomediastinal silhouette is borderline enlarged. No acute osseous abnormalities are seen.  IMPRESSION: Vascular congestion and borderline cardiomegaly, without definite pulmonary edema.   Electronically Signed   By: Roanna Raider M.D.   On: 04/18/2013 22:43   Dg Knee Complete 4 Views Right  03/25/2013   CLINICAL DATA:  Pain post trauma  EXAM: RIGHT KNEE - COMPLETE 4+ VIEW  COMPARISON:  None.  FINDINGS: Frontal, lateral, and bilateral oblique views were obtained. There is no  fracture, dislocation, or effusion. There is rather marked narrowing medially. There is moderate patellofemoral joint space narrowing. There is spurring in all compartments. No erosive change.  IMPRESSION: Osteoarthritic change, most marked medially. No fracture or effusion.   Electronically Signed   By: Bretta Bang M.D.   On: 03/25/2013 09:23     Microbiology: No results found for this or any previous visit (from the past 240 hour(s)).   Labs: Basic Metabolic Panel:  Recent Labs Lab 04/18/13 2220 04/19/13 0200 04/20/13 0501 04/21/13 0445 04/22/13 0456  NA 136 138 135 137 139  K 2.8* 3.3* 4.6 4.9 4.7  CL 96 99 99 102 104  CO2 27 25 26 26 30   GLUCOSE 150* 145* 125* 145* 110*  BUN 14 14 13 14 18   CREATININE 0.87 0.74 0.67 0.61 0.59  CALCIUM 9.7 9.0 9.6 8.8 8.9  MG  --   --   --   --  2.6*   Liver Function Tests:  Recent Labs Lab 04/19/13 0200  AST 18  ALT 11  ALKPHOS 79  BILITOT 0.1*  PROT 7.1  ALBUMIN 3.4*   No results found for this basename: LIPASE, AMYLASE,  in the last 168 hours No results found for this basename: AMMONIA,  in the last 168 hours CBC:  Recent Labs Lab 04/18/13 2220 04/19/13 0200  WBC 8.4 7.0  HGB 14.6 13.9  HCT 43.1 42.0  MCV 96.4 97.0  PLT 173 205   Cardiac Enzymes:  Recent Labs Lab 04/19/13 0200 04/19/13 0650 04/19/13 1314  TROPONINI <0.30 <0.30 <0.30   BNP: No components found with this basename: POCBNP,  CBG:  Recent Labs Lab 04/19/13 0745 04/20/13 0747 04/21/13 0734 04/22/13 0739 04/23/13 0742  GLUCAP 168* 134* 141* 134* 126*    Time coordinating discharge:  Greater than 30 minutes  Signed:  Bryann Gentz, DO [facility] Pager:  [phone] 04/23/2013, 10:29 AM

## 2013-04-22 NOTE — Progress Notes (Addendum)
SATURATION QUALIFICATIONS: (This note is used to comply with regulatory documentation for home oxygen)  Patient Saturations on Room Air at Rest = 99%  Patient Saturations on Room Air while Ambulating = 99%  Patient Saturations on Liters of oxygen while Ambulating =  N/A  Please briefly explain why patient needs home oxygen: N/A  Ambulated pt on RA. Pt's O2 sats were 99% the entire time. Pt was dyspneic and was only able to ambulate about 30-40 ft. Once pt was back in the room and sitting on the side of the bed her dyspnea resolved fairly quickly. Pt's HR increased to 128 with ambulation, but decreased with rest. Will continue to monitor pt.   Arta Bruce [facility] 04/22/2013 3:53 PM

## 2013-04-22 NOTE — Progress Notes (Signed)
[facility] PROGRESS NOTE  Hoda Hon WJX:[id number] DOB: 1956/02/16 DOA: 04/18/2013 PCP: Quitman Livings, MD  Assessment/Plan: COPD with acute exacerbation  - Most of the patient's symptoms are consistent with an acute exacerbation of underlying COPD. Unfortunately she continues to smoke. Patient will be admitted,  -continue IV Solu-Medrol, nebulized bronchodilators, nebulized budesonide, empiric Levaquin  -Decrease IV Solu-Medrol to 80 mg twice a day  -Change Levaquin to po  -change to po prednisone am 04/23/13 . Chest pain  - Very easily reproducible with gentle palpation, suspect the chest pain to be atypical and likely musculoskeletal in nature from repeated bouts of coughing.  - She will be monitored in the telemetry unit, cardiac enzymes will be cycled. -Echocardiogram --EF 60-65%, grade 1 diastolic dysfunction  -Troponins negative x3  . Anxiety  - Lorazepam will be continued. Monitor closely   . Chronic pain syndrome  - Patient claims to have chronic back pain that is essentially unchanged.  -She is moving all 4 extremities without incontinence.  -I have told the patient that I will NOT give any IV opioids -continue outpt Norco 10-325-->schedule it q8 hrs as pt has been asking for it 3times daily for past 4 days -lumbar and thoracic spine x-rays--negative for acute findings  -No bladder or bowel incontinence.  . Hypokalemia  - Secondary to diuretic therapy-patient appears to be an both Lasix and hydrochlorothiazide. We'll stop Lasix and discontinue hydrochlorothiazide.  -resolved  -Check magnesium--2.6  . Hypertension  - Continue diuretic therapy. Adjust medications depending on blood pressure trend  -d/c HCTZ as BP is soft  -Blood pressure has improved, patient is normotensive  . Obesity, Class III, BMI 40-49.9 (morbid obesity)  - Counseled regarding the importance of weight loss   . Tobacco abuse  - Counseled regarding the importance of tobacco cessation, patient  will be placed on a nicotine transdermal patch.   Family Communication:   Husband at beside Disposition Plan:   Home 04/23/13 if stable   Antibiotics:  Levofloxacin 04/19/2013>>>        Procedures/Studies: Dg Thoracic Spine 2 View  04/20/2013   CLINICAL DATA:  Back pain  EXAM: THORACIC SPINE - 2 VIEW  COMPARISON:  None.  FINDINGS: Multilevel osteophytic changes are noted. No compression deformities are seen. No paraspinal mass lesion is noted.  IMPRESSION: Degenerative change without acute abnormality.   Electronically Signed   By: Alcide Clever M.D.   On: 04/20/2013 19:02   Dg Lumbar Spine 2-3 Views  04/20/2013   CLINICAL DATA:  Low back pain  EXAM: LUMBAR SPINE - 2-3 VIEW  COMPARISON:  03/25/2013  FINDINGS: Five lumbar type vertebral bodies are well visualized. Anterolisthesis of L4 on L5 is again noted. This is of a degenerative nature. Disc space narrowing at L5-S1 is again seen.  IMPRESSION: Stable degenerative change.  No acute abnormality is noted.   Electronically Signed   By: Alcide Clever M.D.   On: 04/20/2013 19:02   Dg Lumbar Spine Complete  03/25/2013   CLINICAL DATA:  Low back pain after fall.  EXAM: LUMBAR SPINE - COMPLETE 4+ VIEW  COMPARISON:  May 12, 2012.  FINDINGS: No fracture is noted. Minimal grade 1 anterolisthesis of L4-5 is noted to 2 mild posterior facet hypertrophy. Moderate degenerative disc disease is noted at L5-S1.  IMPRESSION: Degenerative changes as described above which are stable compared to prior exam. No acute abnormality seen in the lumbar spine.   Electronically Signed   By: Roque Lias M.D.   On: 03/25/2013  09:23   Dg Hip Complete Right  03/25/2013   CLINICAL DATA:  Fall today. Right hip common knee, and ankle pain.  EXAM: RIGHT HIP - COMPLETE 2+ VIEW  COMPARISON:  Multiple priors  FINDINGS: There is no evidence of hip fracture or dislocation. Mild degenerative changes of both hips. No acute soft tissue abnormality.  IMPRESSION: No acute  findings.   Electronically Signed   By: Jerene Dilling M.D.   On: 03/25/2013 09:27   Dg Ankle Complete Right  03/25/2013   CLINICAL DATA:  Pain post trauma  EXAM: RIGHT ANKLE - COMPLETE 3+ VIEW  COMPARISON:  None.  FINDINGS: Frontal, oblique, and lateral views were obtained. There is swelling laterally. No fracture or effusion. Ankle mortise appears intact.  IMPRESSION: Swelling laterally. No fracture. Mortise intact.   Electronically Signed   By: Bretta Bang M.D.   On: 03/25/2013 09:21   Dg Chest Port 1 View  04/18/2013   CLINICAL DATA:  Chest pain, shortness of breath, cough and weakness.  EXAM: PORTABLE CHEST - 1 VIEW  COMPARISON:  Chest radiograph performed 08/18/2012  FINDINGS: The lungs are well-aerated. Vascular congestion is noted, without definite pulmonary edema. There is no evidence of focal opacification, pleural effusion or pneumothorax.  The cardiomediastinal silhouette is borderline enlarged. No acute osseous abnormalities are seen.  IMPRESSION: Vascular congestion and borderline cardiomegaly, without definite pulmonary edema.   Electronically Signed   By: Roanna Raider M.D.   On: 04/18/2013 22:43   Dg Knee Complete 4 Views Right  03/25/2013   CLINICAL DATA:  Pain post trauma  EXAM: RIGHT KNEE - COMPLETE 4+ VIEW  COMPARISON:  None.  FINDINGS: Frontal, lateral, and bilateral oblique views were obtained. There is no fracture, dislocation, or effusion. There is rather marked narrowing medially. There is moderate patellofemoral joint space narrowing. There is spurring in all compartments. No erosive change.  IMPRESSION: Osteoarthritic change, most marked medially. No fracture or effusion.   Electronically Signed   By: Bretta Bang M.D.   On: 03/25/2013 09:23         Subjective: Overall patient is breathing better. She continues to complain of back pain which is not worse than the day of admission. She denies any headache, visual disturbance, fevers, chills, chest  discomfort, vomiting, diarrhea, abdominal pain, dysuria.  Objective: Filed Vitals:   04/22/13 0521 04/22/13 0733 04/22/13 1329 04/22/13 1359  BP: 144/98  121/57   Pulse: 89  92   Temp: 97.5 F (36.4 C)  98 F (36.7 C)   TempSrc: Oral  Oral   Resp: 16  16   Height:      Weight: 128.2 kg (282 lb 10.1 oz)     SpO2: 100% 99% 98% 98%    Intake/Output Summary (Last 24 hours) at 04/22/13 1737 Last data filed at 04/22/13 1300  Gross per 24 hour  Intake    640 ml  Output      0 ml  Net    640 ml   Weight change: -1.302 kg (-2 lb 13.9 oz) Exam:   General:  Pt is alert, follows commands appropriately, not in acute distress  HEENT: No icterus, No thrush, [place]/AT  Cardiovascular: RRR, S1/S2, no rubs, no gallops  Respiratory: Diminished breath sounds bilaterally without any wheezing. Good air movement.  Abdomen: Soft/+BS, non tender, non distended, no guarding  Extremities: trace LE edema, No lymphangitis, No petechiae, No rashes, no synovitis  Data Reviewed: Basic Metabolic Panel:  Recent Labs Lab 04/18/13 2220  04/19/13 0200 04/20/13 0501 04/21/13 0445 04/22/13 0456  NA 136 138 135 137 139  K 2.8* 3.3* 4.6 4.9 4.7  CL 96 99 99 102 104  CO2 27 25 26 26 30   GLUCOSE 150* 145* 125* 145* 110*  BUN 14 14 13 14 18   CREATININE 0.87 0.74 0.67 0.61 0.59  CALCIUM 9.7 9.0 9.6 8.8 8.9  MG  --   --   --   --  2.6*   Liver Function Tests:  Recent Labs Lab 04/19/13 0200  AST 18  ALT 11  ALKPHOS 79  BILITOT 0.1*  PROT 7.1  ALBUMIN 3.4*   No results found for this basename: LIPASE, AMYLASE,  in the last 168 hours No results found for this basename: AMMONIA,  in the last 168 hours CBC:  Recent Labs Lab 04/18/13 2220 04/19/13 0200  WBC 8.4 7.0  HGB 14.6 13.9  HCT 43.1 42.0  MCV 96.4 97.0  PLT 173 205   Cardiac Enzymes:  Recent Labs Lab 04/19/13 0200 04/19/13 0650 04/19/13 1314  TROPONINI <0.30 <0.30 <0.30   BNP: No components found with this basename:  POCBNP,  CBG:  Recent Labs Lab 04/19/13 0745 04/20/13 0747 04/21/13 0734 04/22/13 0739  GLUCAP 168* 134* 141* 134*    No results found for this or any previous visit (from the past 240 hour(s)).   Scheduled Meds: . albuterol  2.5 mg Nebulization Q6H  . aspirin EC  325 mg Oral Daily  . budesonide (PULMICORT) nebulizer solution  0.25 mg Nebulization BID  . DULoxetine  60 mg Oral Daily  . enoxaparin (LOVENOX) injection  60 mg Subcutaneous Daily  . HYDROcodone-acetaminophen  1 tablet Oral Q8H  . ipratropium  0.5 mg Nebulization Q6H  . levofloxacin  750 mg Oral Q24H  . LORazepam  1 mg Oral TID  . methylPREDNISolone (SOLU-MEDROL) injection  80 mg Intravenous Q12H  . mirtazapine  15 mg Oral QHS  . nicotine  14 mg Transdermal Daily  . pantoprazole  40 mg Oral Daily  . sertraline  50 mg Oral Daily  . sodium chloride  3 mL Intravenous Q12H  . sodium chloride  3 mL Intravenous Q12H  . traZODone  50 mg Oral QHS   Continuous Infusions:    Garnet Overfield, DO  [facility] Pager [phone]  If 7PM-7AM, please contact night-coverage [email or web address] Password [email or web address] 04/22/2013, 5:37 PM   LOS: 4 days

## 2013-04-23 MED ORDER — LEVOFLOXACIN 750 MG PO TABS: 750.0000 mg | ORAL_TABLET | ORAL | Status: DC

## 2013-04-23 MED ORDER — PREDNISONE 10 MG PO TABS: 60.0000 mg | ORAL_TABLET | Freq: Every day | ORAL | Status: DC

## 2013-04-23 MED ORDER — HYDROCODONE-ACETAMINOPHEN 10-325 MG PO TABS: 1.0000 | ORAL_TABLET | Freq: Three times a day (TID) | ORAL | Status: DC | PRN

## 2013-04-23 MED ORDER — ALBUTEROL SULFATE HFA 108 (90 BASE) MCG/ACT IN AERS: 2.0000 | INHALATION_SPRAY | Freq: Four times a day (QID) | RESPIRATORY_TRACT | Status: DC | PRN

## 2013-04-23 MED ORDER — BUDESONIDE-FORMOTEROL FUMARATE 160-4.5 MCG/ACT IN AERO: 2.0000 | INHALATION_SPRAY | Freq: Two times a day (BID) | RESPIRATORY_TRACT | Status: DC

## 2013-04-23 NOTE — Progress Notes (Signed)
Patient discharged to home. DC instructions given with female family at bedside. Prescription x 1 given for pain med. Pt left unit in wheelchair pushed by nurse tech. Left in good condition. Vwilliams,rn.

## 2013-04-29 ENCOUNTER — Encounter: Payer: Self-pay | Admitting: Internal Medicine

## 2013-04-29 ENCOUNTER — Ambulatory Visit: Payer: Medicare Other | Attending: Internal Medicine | Admitting: Internal Medicine

## 2013-04-29 VITALS — BP 127/87 | HR 91 | Temp 98.0°F | Resp 14 | Ht <= 58 in | Wt 289.0 lb

## 2013-04-29 DIAGNOSIS — I5032 Chronic diastolic (congestive) heart failure: Secondary | ICD-10-CM | POA: Diagnosis not present

## 2013-04-29 DIAGNOSIS — J449 Chronic obstructive pulmonary disease, unspecified: Secondary | ICD-10-CM | POA: Diagnosis not present

## 2013-04-29 DIAGNOSIS — J4489 Other specified chronic obstructive pulmonary disease: Secondary | ICD-10-CM | POA: Insufficient documentation

## 2013-04-29 DIAGNOSIS — G894 Chronic pain syndrome: Secondary | ICD-10-CM | POA: Insufficient documentation

## 2013-04-29 DIAGNOSIS — M549 Dorsalgia, unspecified: Secondary | ICD-10-CM

## 2013-04-29 LAB — COMPLETE METABOLIC PANEL WITH GFR
Alkaline Phosphatase: 72 U/L (ref 39–117)
BUN: 11 mg/dL (ref 6–23)
CO2: 31 mEq/L (ref 19–32)
Calcium: 9.1 mg/dL (ref 8.4–10.5)
Chloride: 97 mEq/L (ref 96–112)
Creat: 0.91 mg/dL (ref 0.50–1.10)
GFR, Est African American: 82 mL/min
Glucose, Bld: 108 mg/dL — ABNORMAL HIGH (ref 70–99)
Sodium: 139 mEq/L (ref 135–145)

## 2013-04-29 LAB — CBC WITH DIFFERENTIAL/PLATELET
Basophils Absolute: 0 10*3/uL (ref 0.0–0.1)
Basophils Relative: 0 % (ref 0–1)
Hemoglobin: 14.1 g/dL (ref 12.0–15.0)
Lymphocytes Relative: 33 % (ref 12–46)
Lymphs Abs: 2.9 10*3/uL (ref 0.7–4.0)
MCH: 33 pg (ref 26.0–34.0)
Neutro Abs: 4.7 10*3/uL (ref 1.7–7.7)
Neutrophils Relative %: 56 % (ref 43–77)
Platelets: 208 10*3/uL (ref 150–400)
RBC: 4.27 MIL/uL (ref 3.87–5.11)
WBC: 8.5 10*3/uL (ref 4.0–10.5)

## 2013-04-29 MED ORDER — KETOROLAC TROMETHAMINE 30 MG/ML IM SOLN: 30.0000 mg | Freq: Once | INTRAMUSCULAR | Status: DC

## 2013-04-29 MED ORDER — TRAMADOL HCL 50 MG PO TABS: 100.0000 mg | ORAL_TABLET | Freq: Four times a day (QID) | ORAL | Status: DC | PRN

## 2013-04-29 MED ORDER — FUROSEMIDE 40 MG PO TABS: 40.0000 mg | ORAL_TABLET | Freq: Every day | ORAL | Status: DC

## 2013-04-29 NOTE — Progress Notes (Signed)
Pt is here following up on her HTN, arthritis, COPD and PVC. Pt is having extreme pain in her lower back that shoots down the back side of her legs.

## 2013-04-29 NOTE — Progress Notes (Signed)
Patient ID: Brooke [name], female   DOB: December 25, 1955, 57 y.o.   MRN: [id number]   CC:  HPI: 57 year old morbidly obese female, who presents for a followup. She was recently admitted on 11/14 for COPD exacerbation. She was treated with IV steroids, treated with antibiotics, switched to by mouth prednisone. She has completed her dose of prednisone. She states that she is still somewhat short of breath when she ambulates. She also has a nonproductive cough. She continues to smoke  She had a 2-D echo was found to have an EF of 60-65% with grade 1 diastolic dysfunction   She has chronic pain syndrome, MRI done in 2013 does reflect mild spinal stenosis, for a minimal narrowing in L5-S1 She is  Requesting a shot of IV or IM pain medication Explain to her that we're not a pain clinic and pain clinic referral will be provided She used to go to a pain clinic 2 years ago but cannot recall the name of the clinic  During her hospitalization the patient was found to have hypokalemia, she is compliant with her diuretics The patient still has some edema in her legs        Allergies  Allergen Reactions  . Kiwi Extract Hives and Swelling  . Aspirin Nausea Only    Can take as long as enteric coated   Past Medical History  Diagnosis Date  . Hypertension   . Asthma   . COPD (chronic obstructive pulmonary disease)   . Anxiety   . Cancer   . Colon cancer   . Dyslipidemia   . Migraine headache   . Chronic bronchitis   . Uterine fibroid   . Ovarian cyst   . GERD (gastroesophageal reflux disease)   . Morbid obesity   . Obstructive sleep apnea     mild  . Chronic pain   . Chronic back pain   . Arthritis   . Osteoarthritis   . Depression    Current Outpatient Prescriptions on File Prior to Visit  Medication Sig Dispense Refill  . albuterol (PROVENTIL HFA;VENTOLIN HFA) 108 (90 BASE) MCG/ACT inhaler Inhale 2 puffs into the lungs every 6 (six) hours as needed for shortness of breath. For  shortness of breeath  3.7 g  1  . budesonide-formoterol (SYMBICORT) 160-4.5 MCG/ACT inhaler Inhale 2 puffs into the lungs 2 (two) times daily.  1 Inhaler  1  . DULoxetine (CYMBALTA) 60 MG capsule Take 60 mg by mouth daily.      Marland Kitchen HYDROcodone-acetaminophen (NORCO) 10-325 MG per tablet Take 1 tablet by mouth every 8 (eight) hours as needed.  30 tablet  0  . LORazepam (ATIVAN) 1 MG tablet Take 1 mg by mouth 3 (three) times daily.      . sertraline (ZOLOFT) 50 MG tablet Take 1 tablet (50 mg total) by mouth daily.  30 tablet  3  . traZODone (DESYREL) 50 MG tablet Take 50 mg by mouth at bedtime.       Marland Kitchen levofloxacin (LEVAQUIN) 750 MG tablet Take 1 tablet (750 mg total) by mouth daily. Start 04/24/13  2 tablet  0  . mirtazapine (REMERON) 15 MG tablet Take 15 mg by mouth at bedtime.      Marland Kitchen omeprazole (PRILOSEC) 40 MG capsule Take 40 mg by mouth daily.       Marland Kitchen Phenylephrine-DM-GG-APAP (MUCINEX FAST-MAX COLD FLU) 5-10-200-325 MG/10ML LIQD Take 20 mLs by mouth every 8 (eight) hours as needed (congestion).      . predniSONE (  DELTASONE) 10 MG tablet Take 6 tablets (60 mg total) by mouth daily with breakfast. Starting 04/24/13, take 6 tabs every morning and decrease by one tablet daily  21 tablet  0   No current facility-administered medications on file prior to visit.   Family History  Problem Relation Age of Onset  . Uterine cancer Mother   . Stroke Father   . Colon cancer Maternal Uncle   . Diabetes Father   . Ovarian cancer Mother   . Hypertension Father   . Breast cancer Maternal Grandmother   . Diabetes Sister   . Asthma Child   . Asthma Child    History   Social History  . Marital Status: Widowed    Spouse Name: N/A    Number of Children: 2  . Years of Education: N/A   Occupational History  . disabled    Social History Main Topics  . Smoking status: Current Some Day Smoker -- 1.00 packs/day for 30 years    Types: Cigarettes  . Smokeless tobacco: Never Used     Comment: tobacco  info given 07/21/2011  . Alcohol Use: No  . Drug Use: No  . Sexual Activity: Yes   Other Topics Concern  . Not on file   Social History Narrative   Lives alone.  Widowed but has a female companion who helps out.  Ambulates with a cane.  Drinks coffee daily.             Review of Systems  Constitutional: Negative for fever, chills, diaphoresis, activity change, appetite change and fatigue.  HENT: Negative for ear pain, nosebleeds, congestion, facial swelling, rhinorrhea, neck pain, neck stiffness and ear discharge.   Eyes: Negative for pain, discharge, redness, itching and visual disturbance.  Respiratory: Negative for cough, choking, chest tightness, shortness of breath, wheezing and stridor.   Cardiovascular: Negative for chest pain, palpitations and leg swelling.  Gastrointestinal: Negative for abdominal distention.  Genitourinary: Negative for dysuria, urgency, frequency, hematuria, flank pain, decreased urine volume, difficulty urinating and dyspareunia.  Musculoskeletal: Negative for back pain, joint swelling, arthralgias and gait problem.  Neurological: Negative for dizziness, tremors, seizures, syncope, facial asymmetry, speech difficulty, weakness, light-headedness, numbness and headaches.  Hematological: Negative for adenopathy. Does not bruise/bleed easily.  Psychiatric/Behavioral: Negative for hallucinations, behavioral problems, confusion, dysphoric mood, decreased concentration and agitation.    Objective:   Filed Vitals:   04/29/13 0949  BP: 127/87  Pulse: 91  Temp: 98 F (36.7 C)  Resp: 14    Physical Exam  Constitutional: Appears well-developed and well-nourished. No distress.  HENT: Normocephalic. External right and left ear normal. Oropharynx is clear and moist.  Eyes: Conjunctivae and EOM are normal. PERRLA, no scleral icterus.  Neck: Normal ROM. Neck supple. No JVD. No tracheal deviation. No thyromegaly.  CVS: RRR, S1/S2 +, no murmurs, no gallops, no  carotid bruit.  Pulmonary: Effort and breath sounds normal, no stridor, rhonchi, wheezes, rales.  Abdominal: Soft. BS +,  no distension, tenderness, rebound or guarding.  Musculoskeletal: Normal range of motion. No edema and no tenderness.  Lymphadenopathy: No lymphadenopathy noted, cervical, inguinal. Neuro: Alert. Normal reflexes, muscle tone coordination. No cranial nerve deficit. Skin: Skin is warm and dry. No rash noted. Not diaphoretic. No erythema. No pallor.  Psychiatric: Normal mood and affect. Behavior, judgment, thought content normal.   Lab Results  Component Value Date   WBC 7.0 04/19/2013   HGB 13.9 04/19/2013   HCT 42.0 04/19/2013   MCV 97.0 04/19/2013  PLT 205 04/19/2013   Lab Results  Component Value Date   CREATININE 0.59 04/22/2013   BUN 18 04/22/2013   NA 139 04/22/2013   K 4.7 04/22/2013   CL 104 04/22/2013   CO2 30 04/22/2013    Lab Results  Component Value Date   HGBA1C 5.5 08/20/2012   Lipid Panel     Component Value Date/Time   CHOL 161 10/22/2011 0553   TRIG 94 10/22/2011 0553   HDL 77 10/22/2011 0553   CHOLHDL 2.1 10/22/2011 0553   VLDL 19 10/22/2011 0553   LDLCALC 65 10/22/2011 0553       Assessment and plan:   Patient Active Problem List   Diagnosis Date Noted  . Chest pain 04/19/2013  . Obesity, Class III, BMI 40-49.9 (morbid obesity) 08/23/2012  . Unspecified constipation 08/20/2012  . Headache 08/20/2012  . Chronic pain syndrome 08/19/2012  . Hyperglycemia 08/19/2012  . Insomnia 08/19/2012  . COPD with acute exacerbation 08/18/2012  . Asthma exacerbation in COPD 10/21/2011  . Hypokalemia 10/21/2011  . Tobacco abuse 10/21/2011  . Arthritis 10/21/2011  . Anxiety 10/21/2011  . Depression 10/21/2011  . Esophageal reflux 07/30/2011  . Diarrhea following gastrointestinal surgery 07/30/2011  . PVC (premature ventricular contraction) 07/10/2011  . Morbid obesity   . Hypertension   . Dyslipidemia   . 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 The patient verbalized understanding. The patient was told to call to get any lab results if not heard anything in the next week.

## 2013-05-16 ENCOUNTER — Ambulatory Visit: Payer: Medicare Other | Attending: Internal Medicine | Admitting: Physical Therapy

## 2013-05-16 DIAGNOSIS — M542 Cervicalgia: Secondary | ICD-10-CM | POA: Insufficient documentation

## 2013-05-16 DIAGNOSIS — IMO0001 Reserved for inherently not codable concepts without codable children: Secondary | ICD-10-CM | POA: Diagnosis not present

## 2013-05-16 DIAGNOSIS — M545 Low back pain, unspecified: Secondary | ICD-10-CM | POA: Insufficient documentation

## 2013-05-23 ENCOUNTER — Telehealth: Payer: Self-pay | Admitting: Internal Medicine

## 2013-05-23 NOTE — Telephone Encounter (Signed)
Pt called regarding her tramadol medication, pt states that her medication does not work and would like for the doctor to prescribe her something else, please contact pt

## 2013-05-23 NOTE — Telephone Encounter (Signed)
Spoke with pt regarding pain needs. Pt in process getting in pain clinic. Last note 05/20/13 awaiting previous pain clinic reports. Pt also has sports med referral Informed pt narcotic policy.

## 2013-05-24 ENCOUNTER — Ambulatory Visit: Payer: Medicare Other | Admitting: Physical Therapy

## 2013-05-26 ENCOUNTER — Ambulatory Visit: Payer: Medicare Other | Admitting: Physical Therapy

## 2013-05-30 ENCOUNTER — Ambulatory Visit: Payer: Medicare Other

## 2013-06-01 ENCOUNTER — Ambulatory Visit: Payer: Medicare Other

## 2013-06-05 ENCOUNTER — Emergency Department (HOSPITAL_COMMUNITY): Payer: Medicare Other

## 2013-06-05 ENCOUNTER — Emergency Department (HOSPITAL_COMMUNITY)
Admission: EM | Admit: 2013-06-05 | Discharge: 2013-06-05 | Disposition: A | Discharge status: Home / Self Care | Payer: Medicare Other | Attending: Emergency Medicine | Admitting: Emergency Medicine

## 2013-06-05 ENCOUNTER — Encounter (HOSPITAL_COMMUNITY): Payer: Self-pay | Admitting: Emergency Medicine

## 2013-06-05 DIAGNOSIS — M129 Arthropathy, unspecified: Secondary | ICD-10-CM | POA: Diagnosis not present

## 2013-06-05 DIAGNOSIS — Z85038 Personal history of other malignant neoplasm of large intestine: Secondary | ICD-10-CM | POA: Diagnosis not present

## 2013-06-05 DIAGNOSIS — G8929 Other chronic pain: Secondary | ICD-10-CM | POA: Insufficient documentation

## 2013-06-05 DIAGNOSIS — E785 Hyperlipidemia, unspecified: Secondary | ICD-10-CM | POA: Insufficient documentation

## 2013-06-05 DIAGNOSIS — F411 Generalized anxiety disorder: Secondary | ICD-10-CM | POA: Diagnosis not present

## 2013-06-05 DIAGNOSIS — G4733 Obstructive sleep apnea (adult) (pediatric): Secondary | ICD-10-CM | POA: Diagnosis not present

## 2013-06-05 DIAGNOSIS — K219 Gastro-esophageal reflux disease without esophagitis: Secondary | ICD-10-CM | POA: Insufficient documentation

## 2013-06-05 DIAGNOSIS — IMO0002 Reserved for concepts with insufficient information to code with codable children: Secondary | ICD-10-CM | POA: Insufficient documentation

## 2013-06-05 DIAGNOSIS — F329 Major depressive disorder, single episode, unspecified: Secondary | ICD-10-CM | POA: Insufficient documentation

## 2013-06-05 DIAGNOSIS — Z79899 Other long term (current) drug therapy: Secondary | ICD-10-CM | POA: Diagnosis not present

## 2013-06-05 DIAGNOSIS — I10 Essential (primary) hypertension: Secondary | ICD-10-CM | POA: Insufficient documentation

## 2013-06-05 DIAGNOSIS — F17200 Nicotine dependence, unspecified, uncomplicated: Secondary | ICD-10-CM | POA: Diagnosis not present

## 2013-06-05 DIAGNOSIS — M549 Dorsalgia, unspecified: Secondary | ICD-10-CM | POA: Insufficient documentation

## 2013-06-05 DIAGNOSIS — J4489 Other specified chronic obstructive pulmonary disease: Secondary | ICD-10-CM | POA: Insufficient documentation

## 2013-06-05 DIAGNOSIS — M19019 Primary osteoarthritis, unspecified shoulder: Secondary | ICD-10-CM | POA: Diagnosis not present

## 2013-06-05 DIAGNOSIS — J449 Chronic obstructive pulmonary disease, unspecified: Secondary | ICD-10-CM | POA: Insufficient documentation

## 2013-06-05 DIAGNOSIS — F3289 Other specified depressive episodes: Secondary | ICD-10-CM | POA: Insufficient documentation

## 2013-06-05 DIAGNOSIS — M79609 Pain in unspecified limb: Secondary | ICD-10-CM | POA: Diagnosis not present

## 2013-06-05 MED ORDER — OXYCODONE-ACETAMINOPHEN 5-325 MG PO TABS: 1.0000 | ORAL_TABLET | Freq: Four times a day (QID) | ORAL | Status: DC | PRN

## 2013-06-05 MED ORDER — OXYCODONE-ACETAMINOPHEN 5-325 MG PO TABS
2.0000 | ORAL_TABLET | Freq: Once | ORAL | Status: AC
  Administered 2013-06-05: 2 via ORAL
  Filled 2013-06-05: qty 2

## 2013-06-05 MED ORDER — HYDROMORPHONE HCL PF 2 MG/ML IJ SOLN
2.0000 mg | Freq: Once | INTRAMUSCULAR | Status: AC
  Administered 2013-06-05: 2 mg via INTRAMUSCULAR
  Filled 2013-06-05: qty 1

## 2013-06-05 MED ORDER — METHOCARBAMOL 500 MG PO TABS: 1000.0000 mg | ORAL_TABLET | Freq: Four times a day (QID) | ORAL | Status: DC

## 2013-06-05 MED ORDER — ONDANSETRON 4 MG PO TBDP
4.0000 mg | ORAL_TABLET | Freq: Once | ORAL | Status: AC
  Administered 2013-06-05: 4 mg via ORAL
  Filled 2013-06-05: qty 1

## 2013-06-05 MED ORDER — NAPROXEN 500 MG PO TABS: 500.0000 mg | ORAL_TABLET | Freq: Two times a day (BID) | ORAL | Status: DC

## 2013-06-05 NOTE — ED Provider Notes (Signed)
CSN: [id number]     Arrival date & time 06/05/13  1420 History   First MD Initiated Contact with Patient 06/05/13 1613    This chart was scribed for Nelle Don, a non-physician practitioner working with Hurman Horn, MD by Lewanda Rife, ED Scribe. This patient was seen in room WTR8/WTR8 and the patient's care was started at 4:29 PM      Chief Complaint  Patient presents with  . Arm Pain   (Consider location/radiation/quality/duration/timing/severity/associated sxs/prior Treatment) The history is provided by the patient and medical records. No language interpreter was used.   HPI Comments: Brooke [name] is a 57 y.o. female who presents to the Emergency Department with PMHx of chronic back pain and osteoarthritis complaining of constant unchanged right arm pain onset 8 days after someone caught her when she fell. Describes pain as sharp and "nagging" pain. Reports trying prescribed pain medications and heating pad with no relief of symptoms. Reports pain is exacerbated with movement and alleviated by nothing. Denies associated numbness. Denies associated neck pain. PMHx of similar pain. Reports PMHx of COPD, HTN, colon cancer, bowel resection, and blood clots. She has not been on anticoagulation for years. Received surgery and chemotherapy for colon cancer.   Past Medical History  Diagnosis Date  . Hypertension   . Asthma   . COPD (chronic obstructive pulmonary disease)   . Anxiety   . Cancer   . Colon cancer   . Dyslipidemia   . Migraine headache   . Chronic bronchitis   . Uterine fibroid   . Ovarian cyst   . GERD (gastroesophageal reflux disease)   . Morbid obesity   . Obstructive sleep apnea     mild  . Chronic pain   . Chronic back pain   . Arthritis   . Osteoarthritis   . Depression    Past Surgical History  Procedure Laterality Date  . Knee arthroscopy      bil.  . Carpal tunnel release      rt  . Mouth surgery      teeth extraction  . Cesarean section     . Subtotal colectomy    . Colonoscopy    . Tubal ligation     Family History  Problem Relation Age of Onset  . Uterine cancer Mother   . Stroke Father   . Colon cancer Maternal Uncle   . Diabetes Father   . Ovarian cancer Mother   . Hypertension Father   . Breast cancer Maternal Grandmother   . Diabetes Sister   . Asthma Child   . Asthma Child    History  Substance Use Topics  . Smoking status: Current Some Day Smoker -- 1.00 packs/day for 30 years    Types: Cigarettes  . Smokeless tobacco: Never Used     Comment: tobacco info given 07/21/2011  . Alcohol Use: No   OB History   Grav Para Term Preterm Abortions TAB SAB Ect Mult Living   2 2 2       2      Review of Systems  Constitutional: Negative for fever.  HENT: Negative for rhinorrhea and sore throat.   Eyes: Negative for redness.  Respiratory: Negative for cough.   Cardiovascular: Negative for chest pain.  Gastrointestinal: Negative for nausea, vomiting, abdominal pain and diarrhea.  Genitourinary: Negative for dysuria.  Musculoskeletal: Positive for back pain (chronic) and myalgias. Negative for joint swelling and neck pain.  Skin: Negative for rash.  Neurological: Negative for  headaches.    Allergies  Kiwi extract and Aspirin  Home Medications   Current Outpatient Rx  Name  Route  Sig  Dispense  Refill  . albuterol (PROVENTIL HFA;VENTOLIN HFA) 108 (90 BASE) MCG/ACT inhaler   Inhalation   Inhale 2 puffs into the lungs every 6 (six) hours as needed for shortness of breath. For shortness of breeath   3.7 g   1   . budesonide-formoterol (SYMBICORT) 160-4.5 MCG/ACT inhaler   Inhalation   Inhale 2 puffs into the lungs 2 (two) times daily.   1 Inhaler   1   . DULoxetine (CYMBALTA) 60 MG capsule   Oral   Take 60 mg by mouth daily.         . furosemide (LASIX) 40 MG tablet   Oral   Take 1 tablet (40 mg total) by mouth daily.   120 tablet   2   . HYDROcodone-acetaminophen (NORCO) 10-325 MG per  tablet   Oral   Take 1 tablet by mouth every 8 (eight) hours as needed.   30 tablet   0   . LORazepam (ATIVAN) 1 MG tablet   Oral   Take 1 mg by mouth 3 (three) times daily.         . mirtazapine (REMERON) 15 MG tablet   Oral   Take 15 mg by mouth at bedtime.         Marland Kitchen omeprazole (PRILOSEC) 40 MG capsule   Oral   Take 40 mg by mouth daily.          . traMADol (ULTRAM) 50 MG tablet   Oral   Take 2 tablets (100 mg total) by mouth every 6 (six) hours as needed for moderate pain.   90 tablet   0   . traZODone (DESYREL) 50 MG tablet   Oral   Take 50 mg by mouth at bedtime.           BP 124/62  Pulse 64  Temp(Src) 97.9 F (36.6 C) (Oral)  Resp 14  SpO2 100%  LMP 05/24/2003 Physical Exam  Nursing note and vitals reviewed. Constitutional: She appears well-developed and well-nourished. No distress.  Morbidly obese   HENT:  Head: Normocephalic and atraumatic.  Eyes: Conjunctivae and EOM are normal.  Neck: Normal range of motion. Neck supple. No tracheal deviation present.  Cardiovascular: Normal rate, regular rhythm and intact distal pulses.   Pulses:      Radial pulses are 2+ on the right side, and 2+ on the left side.  Pulmonary/Chest: Effort normal. No respiratory distress.  Abdominal: Soft. There is no tenderness. There is no CVA tenderness.  Musculoskeletal:       Right shoulder: She exhibits decreased range of motion (due to pain in arm) and tenderness. She exhibits no bony tenderness.       Right elbow: Normal.      Right wrist: Normal.       Cervical back: Normal.       Right upper arm: She exhibits tenderness. She exhibits no bony tenderness, no swelling and no edema.       Right forearm: Normal.       Right hand: Normal. Normal sensation noted. Normal strength noted.  No midline C-spine, T-spine, or L-spine tenderness with no step-offs or deformities noted.   Pain is worse over the inner aspect of the upper arm, distal to shoulder. There is no warmth  or gross swelling. There is no redness or cellulitis. There is no  palpable abscess. Patient is very tender, out-of-proportion to exam.   Neurological: She is alert. She has normal strength and normal reflexes. No sensory deficit.  5/5 strength in entire lower extremities bilaterally. No sensation deficit.   Skin: Skin is warm and dry. No rash noted.  Psychiatric: She has a normal mood and affect. Her behavior is normal.    ED Course  Procedures (including critical care time)  COORDINATION OF CARE:  Nursing notes reviewed. Vital signs reviewed. Initial pt interview and examination performed.   5:02 PM-Discussed work up and treatment plan with pt at bedside, which includes doppler study of right upper extremity. Pt agrees with plan.   Treatment plan initiated: Medications  HYDROmorphone (DILAUDID) injection 2 mg (2 mg Intramuscular Given 06/05/13 1720)  ondansetron (ZOFRAN-ODT) disintegrating tablet 4 mg (4 mg Oral Given 06/05/13 1720)  oxyCODONE-acetaminophen (PERCOCET/ROXICET) 5-325 MG per tablet 2 tablet (2 tablets Oral Given 06/05/13 1925)     Initial diagnostic testing ordered.    Labs Review Labs Reviewed - No data to display Imaging Review Dg Shoulder Right  06/05/2013   CLINICAL DATA:  Pain  EXAM: RIGHT SHOULDER - 2+ VIEW  COMPARISON:  None.  FINDINGS: Frontal, Y scapular, and axillary images were obtained. There is moderate generalized osteoarthritis. No fracture or dislocation. No erosive change.  IMPRESSION: Moderate osteoarthritis.  No fracture or dislocation.   Electronically Signed   By: Bretta Bang M.D.   On: 06/05/2013 19:32   Dg Humerus Right  06/05/2013   CLINICAL DATA:  Pain  EXAM: RIGHT HUMERUS - 2+ VIEW  COMPARISON:  None.  FINDINGS: Frontal and lateral views were obtained. No fracture or dislocation. No abnormal periosteal reaction. There is osteoarthritic change in the right shoulder joint.  IMPRESSION: Osteoarthritic change right shoulder joint. No  fracture or dislocation.   Electronically Signed   By: Bretta Bang M.D.   On: 06/05/2013 19:33    EKG Interpretation   None      Vital signs reviewed and are as follows: Filed Vitals:   06/05/13 2028  BP: 133/62  Pulse: 70  Temp: 98.6 F (37 C)  Resp: 18   Patient discussed with and seen by Dr. Fonnie Jarvis. Agrees on plan to obtain UE doppler.   Doppler negative. Pt informed. She was given additional pain medication.   X-rays ordered and are neg.   Pt informed of results. Discussed pain control and need for PCP f/u. Patient urged to return with worsening symptoms or other concerns. Patient verbalized understanding and agrees with plan.   Patient counseled on use of narcotic pain medications. Counseled not to combine these medications with others containing tylenol. Urged not to drink alcohol, drive, or perform any other activities that requires focus while taking these medications. The patient verbalizes understanding and agrees with the plan.  Patient counseled on proper use of muscle relaxant medication.  They were told not to drink alcohol, drive any vehicle, or do any dangerous activities while taking this medication.  Patient verbalized understanding.    MDM   1. Pain of upper extremity, right    Pain of R UE of unknown etiology in patient with various pain syndromes in past. No obvious infection. UE doppler negative. X-ray of shoulder and humerus neg. 2+ radial pulses and do not suspect arterial compromise. PCP f/u needed. Pain control given. UE is neurovascularly intact. Likely musculoskeletal pain, radiculopathy less likely.   I personally performed the services described in this documentation, which was scribed in my presence.  The recorded information has been reviewed and is accurate.      Renne Crigler, PA-C 06/05/13 2049

## 2013-06-05 NOTE — ED Notes (Signed)
Per pt, states she has chronic back pain-almost fell last sat, but someone caught her-states upper right arm pas started this week-states unable to raise arm

## 2013-06-05 NOTE — ED Notes (Signed)
Patient transported to Ultrasound. Vascular complete

## 2013-06-05 NOTE — ED Notes (Signed)
Patient transported to X-ray 

## 2013-06-05 NOTE — Progress Notes (Signed)
VASCULAR LAB PRELIMINARY  PRELIMINARY  PRELIMINARY  PRELIMINARY  Right upper extremity venous Doppler completed.    Preliminary report:  There is no DVT or SVT noted in the right upper extremity.  Sixto Bowdish, RVT 06/05/2013, 7:05 PM

## 2013-06-05 NOTE — ED Provider Notes (Signed)
Medical screening examination/treatment/procedure(s) were conducted as a shared visit with non-physician practitioner(s) and myself.  I personally evaluated the patient during the encounter.  EKG Interpretation   None      Diffuse vague right upper arm tenderness between the shoulder and elbow over biceps region without rash or erythema; doesn't seem to the shoulder joint negative drop test distal light touch and motor strength intact right-handed tissue lesions of the median radial and ulnar nerve function.  Hurman Horn, MD 06/06/13 2129

## 2013-06-08 DIAGNOSIS — M5137 Other intervertebral disc degeneration, lumbosacral region: Secondary | ICD-10-CM | POA: Diagnosis not present

## 2013-06-08 DIAGNOSIS — M5030 Other cervical disc degeneration, unspecified cervical region: Secondary | ICD-10-CM | POA: Diagnosis not present

## 2013-06-08 DIAGNOSIS — G894 Chronic pain syndrome: Secondary | ICD-10-CM | POA: Diagnosis not present

## 2013-06-08 DIAGNOSIS — Z79899 Other long term (current) drug therapy: Secondary | ICD-10-CM | POA: Diagnosis not present

## 2013-06-08 DIAGNOSIS — M79609 Pain in unspecified limb: Secondary | ICD-10-CM | POA: Diagnosis not present

## 2013-06-25 IMAGING — CR DG CHEST 2V
2 series · 2 of 2 positions shown · non-contrast
Comparison: 07/28/2011]

CLINICAL DATA: Chest and back pain, shortness of breath,
hypertension

CHEST - 2 VIEW

[w chest pa]
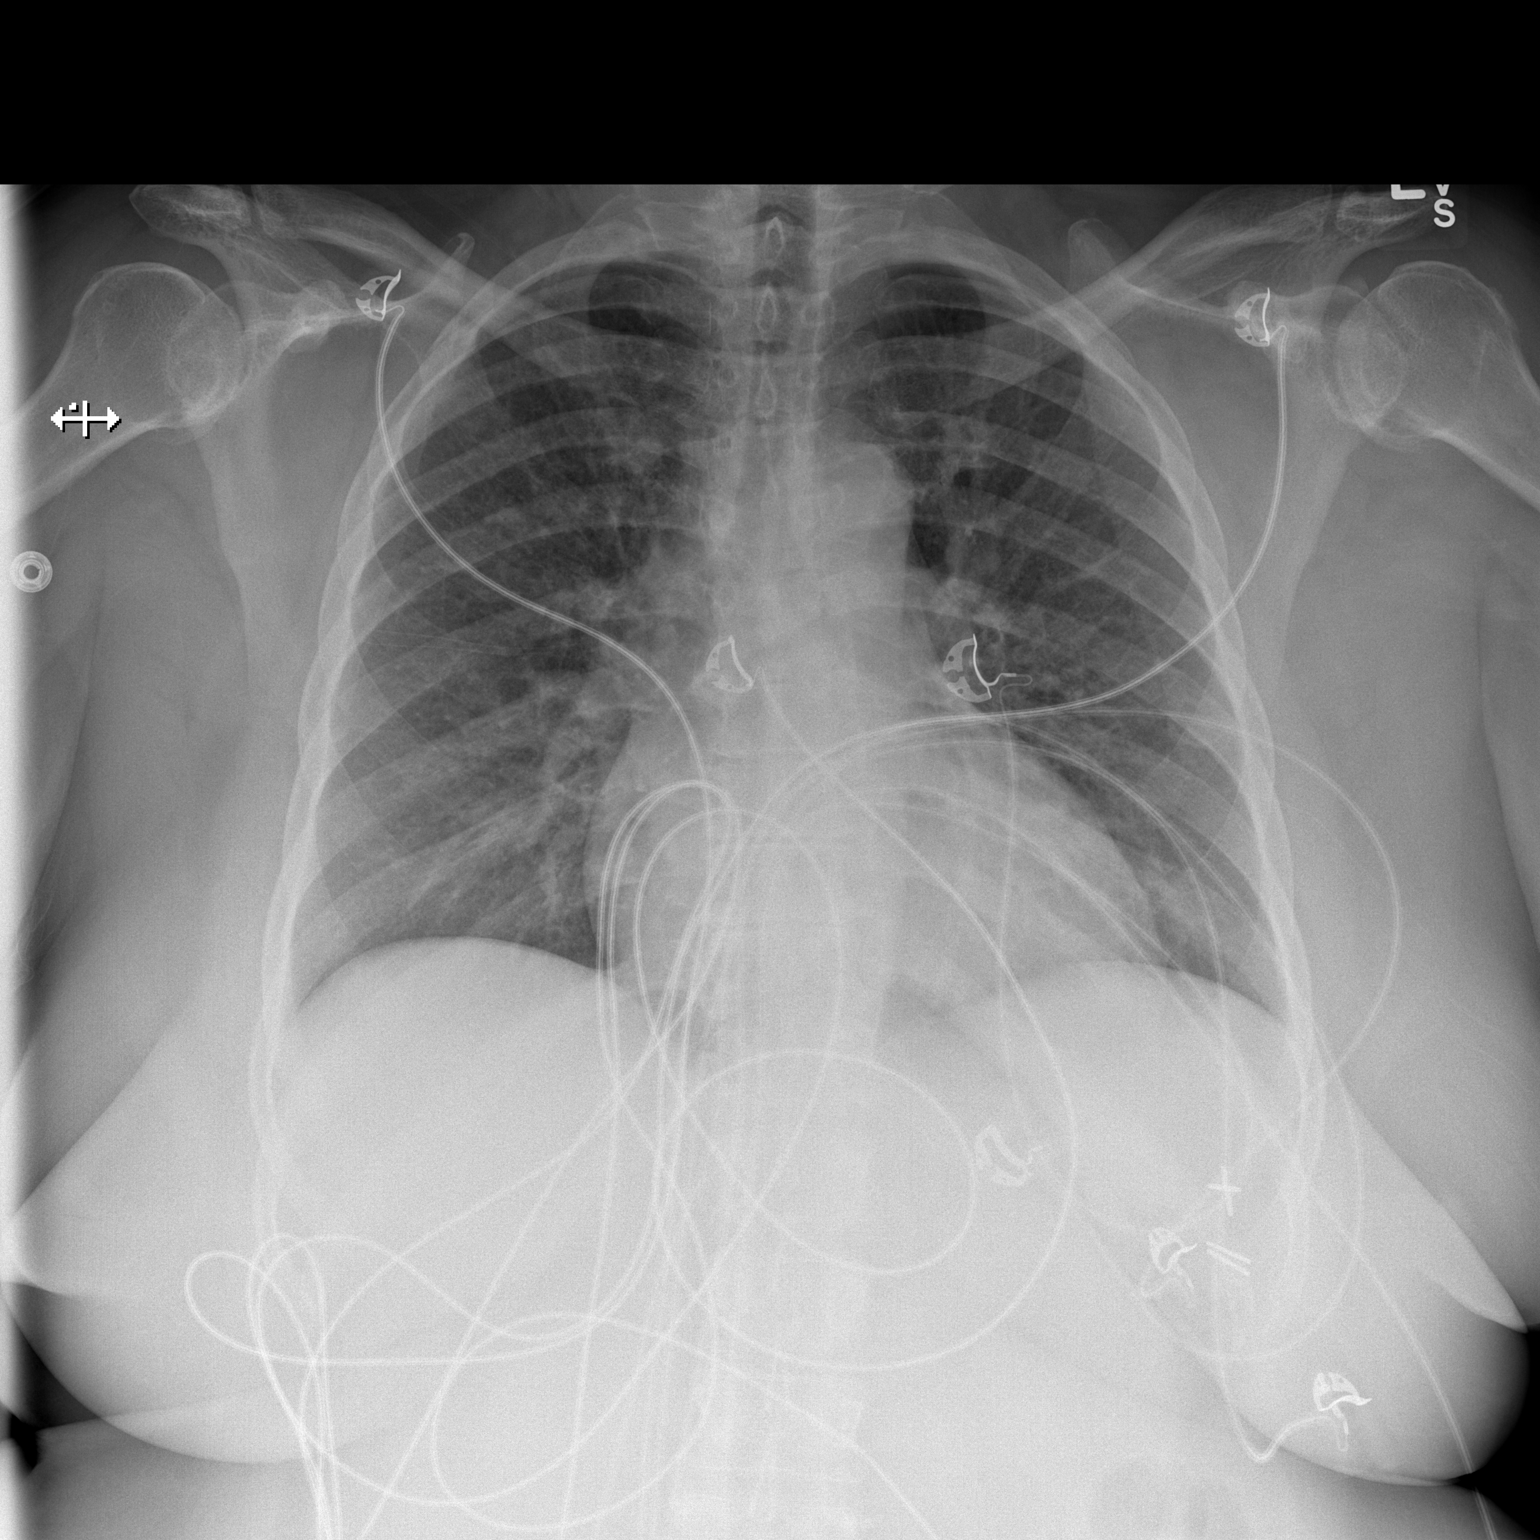

[w chest lat]
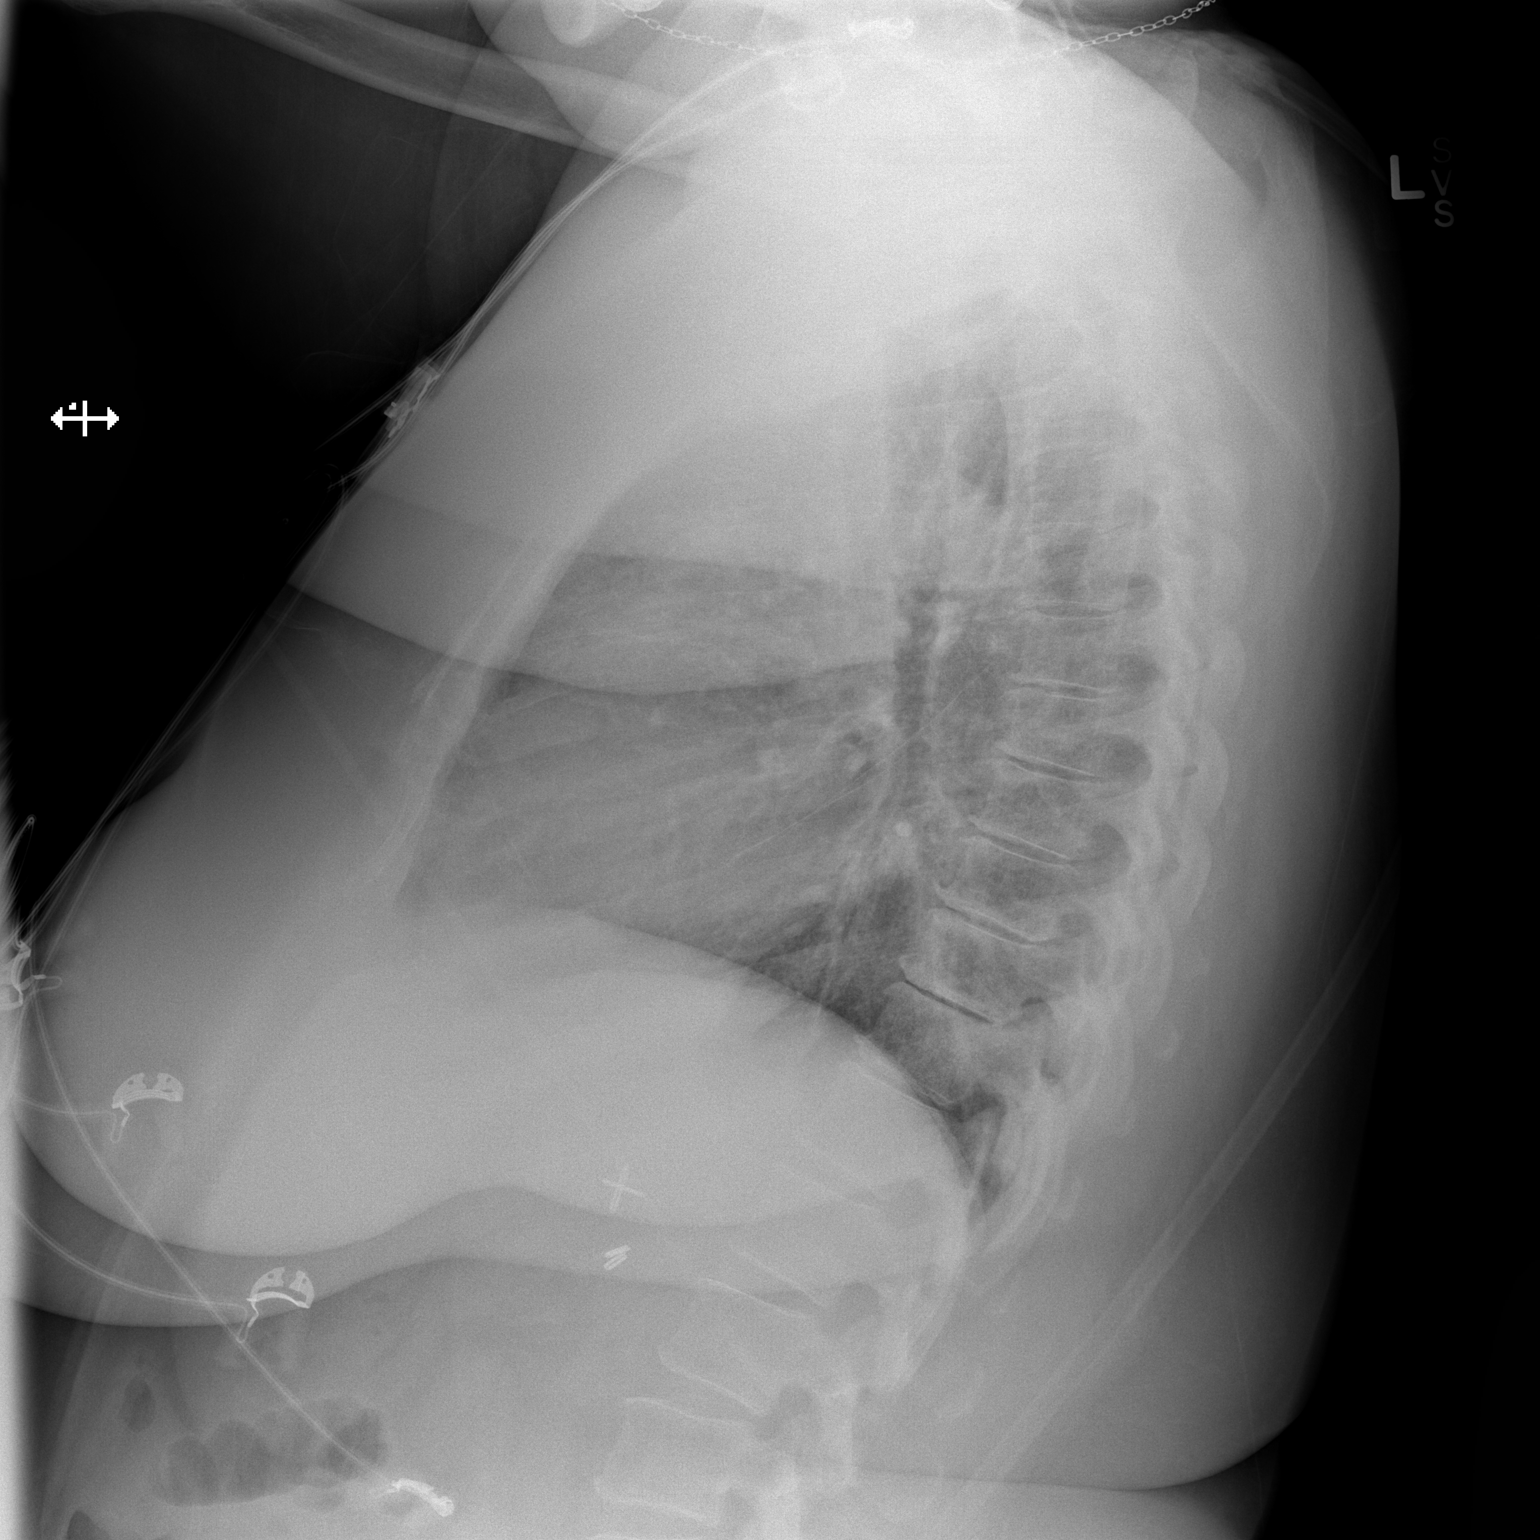

[2 of 2 positions shown; findings below may reference images not displayed]

FINDINGS: Vascular clips in the upper abdomen.  Mild bilateral
diffuse interstitial prominence.  No focal airspace consolidation.
No overt interstitial edema.  No effusion.  Borderline
cardiomegaly.}
IMPRESSION: 1.  Stable mild diffuse interstitial prominence, probably chronic..
2.  Borderline cardiomegaly.]

## 2013-06-29 NOTE — Telephone Encounter (Signed)
Yes please, Tylenol No. 3, 30 tablets with no refills

## 2013-07-01 ENCOUNTER — Telehealth: Payer: Self-pay | Admitting: Emergency Medicine

## 2013-07-01 IMAGING — CR DG CHEST 2V
2 series · 2 of 2 positions shown · non-contrast
Comparison: 10/15/2011

CLINICAL DATA: Left shoulder pain.  Short of breath.

CHEST - 2 VIEW

[w chest pa]
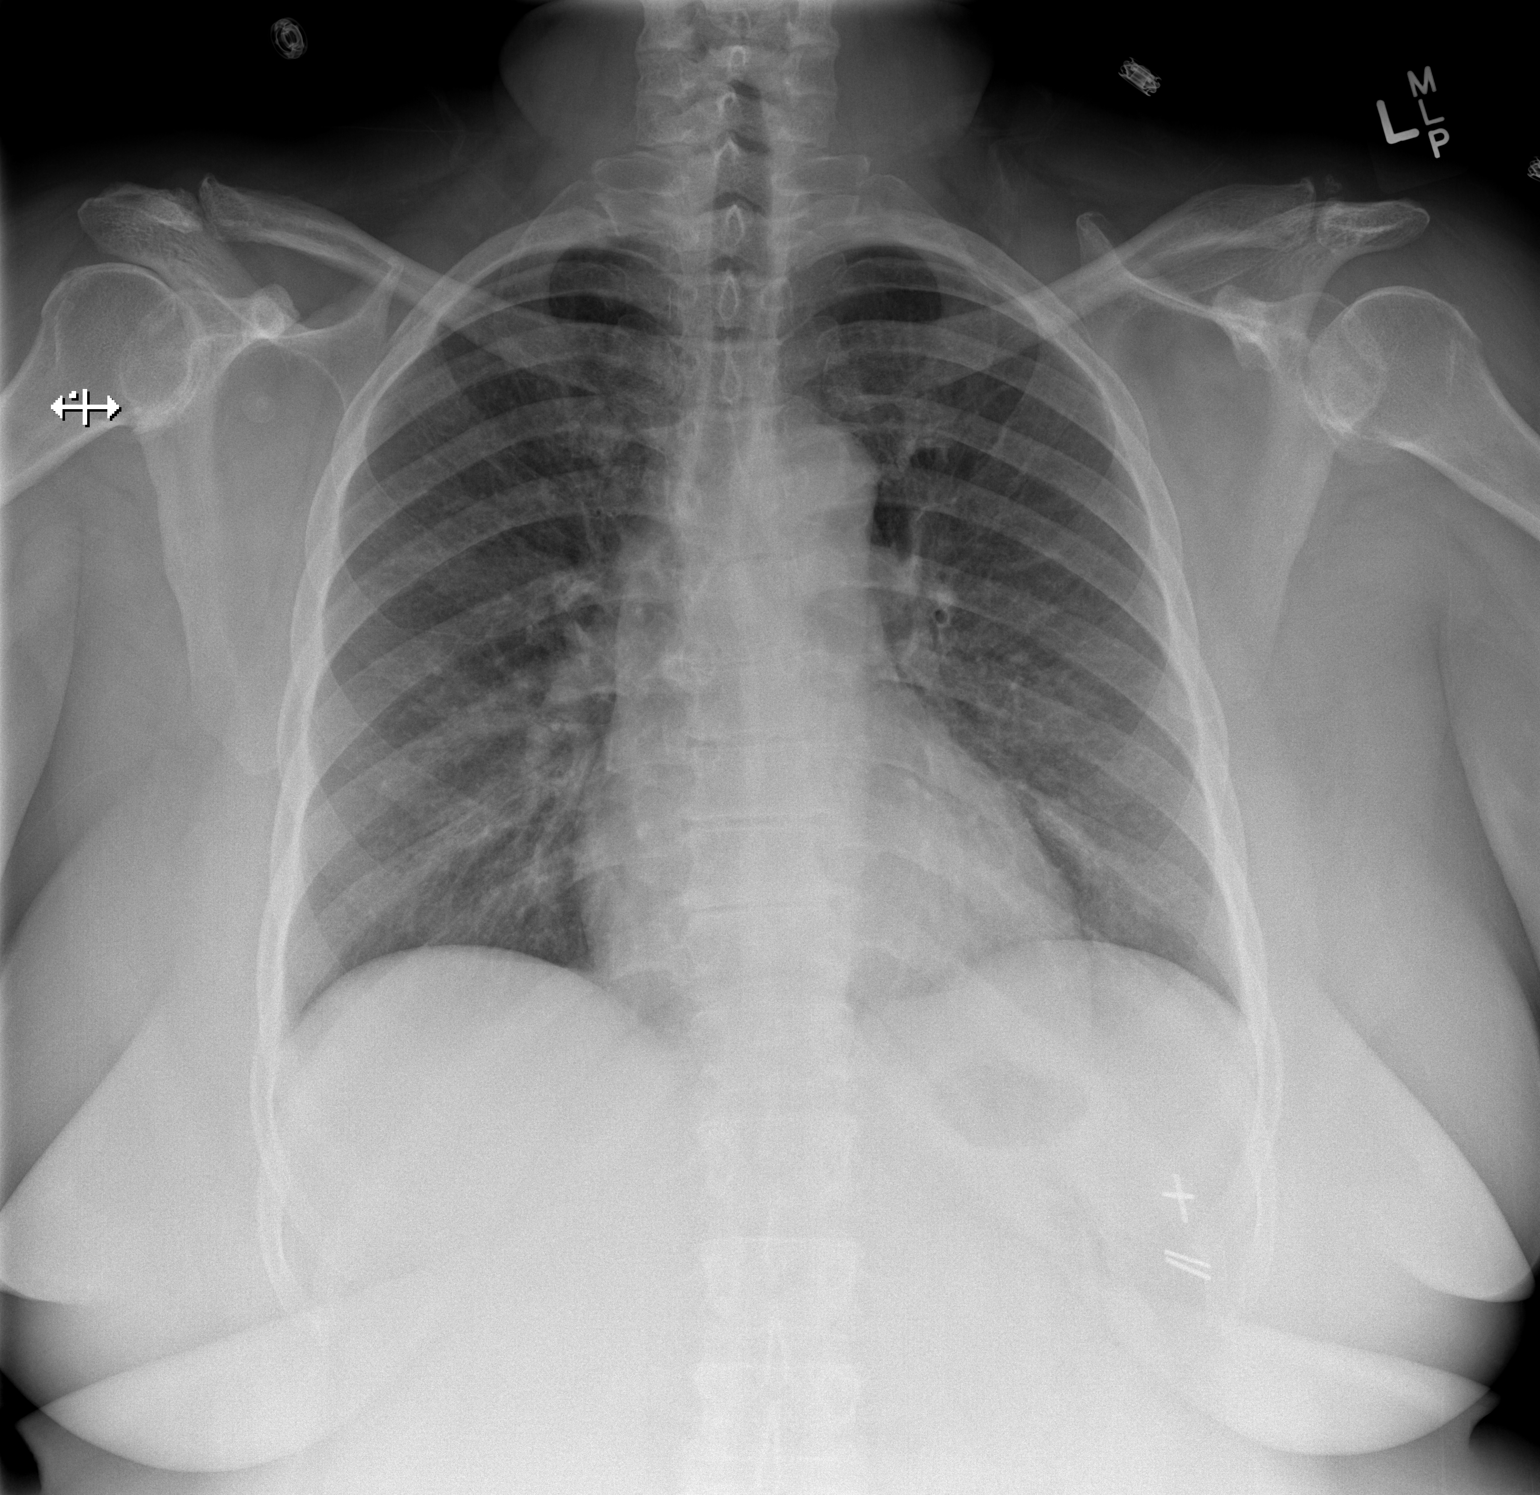

[w chest lat]
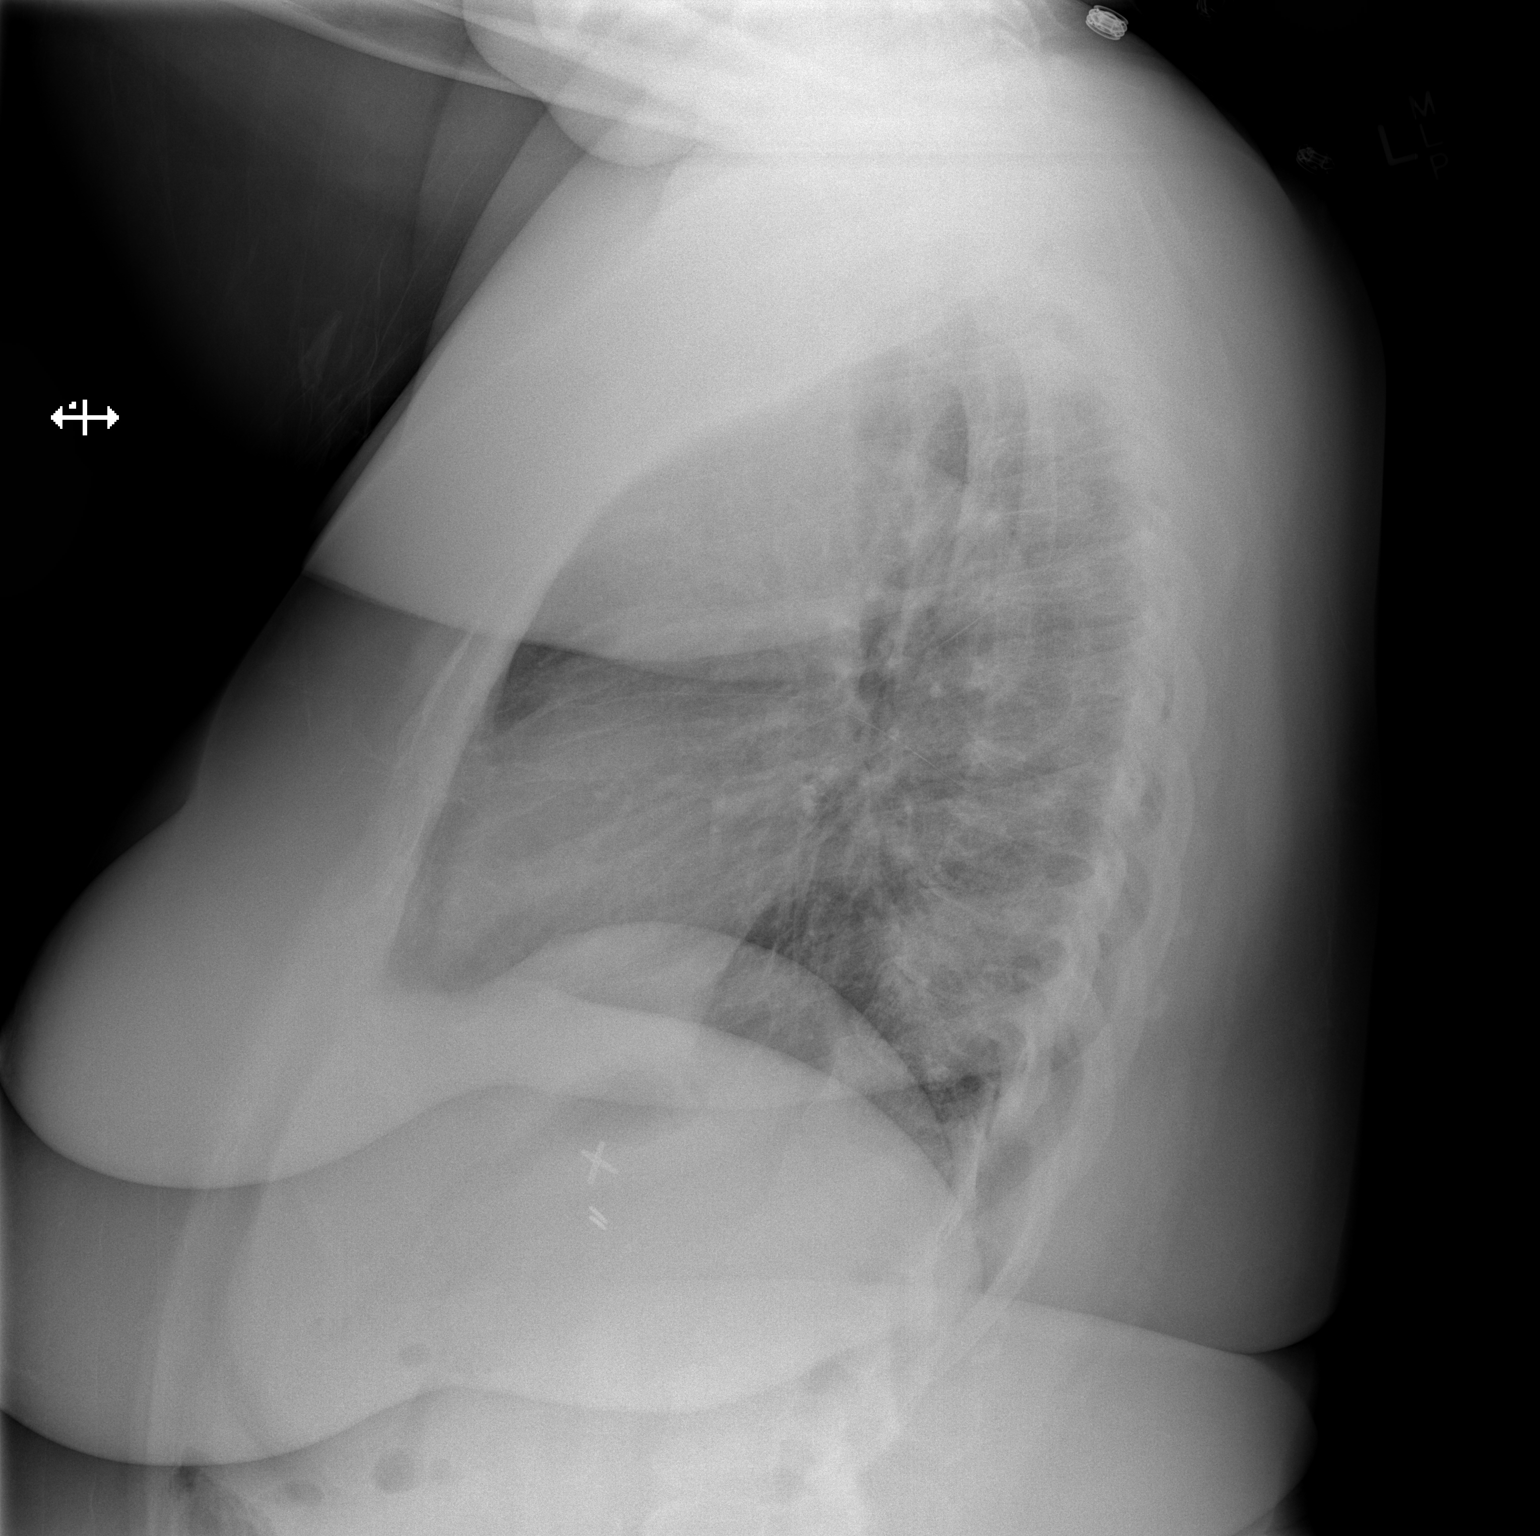

[2 of 2 positions shown; findings below may reference images not displayed]

FINDINGS: Borderline cardiomegaly.  Bronchitic changes.  No
consolidation or mass.  No pleural effusion or pneumothorax.
IMPRESSION: Borderline cardiomegaly and bronchitic changes.

## 2013-07-01 NOTE — Telephone Encounter (Signed)
Pt aware script for Tylenol #3 30 dy,no refill will be left at front desk.

## 2013-07-04 ENCOUNTER — Other Ambulatory Visit: Payer: Self-pay | Admitting: Emergency Medicine

## 2013-07-04 ENCOUNTER — Telehealth: Payer: Self-pay | Admitting: Internal Medicine

## 2013-07-04 MED ORDER — TRAMADOL HCL 50 MG PO TABS: 100.0000 mg | ORAL_TABLET | Freq: Four times a day (QID) | ORAL | Status: DC | PRN

## 2013-07-04 MED ORDER — LORAZEPAM 1 MG PO TABS: 1.0000 mg | ORAL_TABLET | Freq: Two times a day (BID) | ORAL | Status: DC | PRN

## 2013-07-04 NOTE — Telephone Encounter (Signed)
Pt left voicemail regarding needing refill for meds. Please f/u with pt.

## 2013-07-04 NOTE — Telephone Encounter (Signed)
Tylenol #3 script given per Dr. Doreene Burke. Will leave at front desk Pt requesting Clonazepam script refill

## 2013-07-05 ENCOUNTER — Telehealth: Payer: Self-pay | Admitting: Internal Medicine

## 2013-07-05 NOTE — Telephone Encounter (Signed)
Pt called regarding one of her medication, pt came in yesterday to pick up her medication at the pharmacy and she notice that we have given her a wrong medication. Please contact pt

## 2013-07-06 DIAGNOSIS — M5030 Other cervical disc degeneration, unspecified cervical region: Secondary | ICD-10-CM | POA: Diagnosis not present

## 2013-07-06 DIAGNOSIS — G894 Chronic pain syndrome: Secondary | ICD-10-CM | POA: Diagnosis not present

## 2013-07-06 DIAGNOSIS — M5137 Other intervertebral disc degeneration, lumbosacral region: Secondary | ICD-10-CM | POA: Diagnosis not present

## 2013-07-06 DIAGNOSIS — Z79899 Other long term (current) drug therapy: Secondary | ICD-10-CM | POA: Diagnosis not present

## 2013-07-13 ENCOUNTER — Other Ambulatory Visit: Payer: Self-pay | Admitting: Pain Medicine

## 2013-07-13 DIAGNOSIS — M545 Low back pain, unspecified: Secondary | ICD-10-CM

## 2013-07-13 DIAGNOSIS — M5410 Radiculopathy, site unspecified: Secondary | ICD-10-CM

## 2013-07-13 DIAGNOSIS — M542 Cervicalgia: Secondary | ICD-10-CM

## 2013-07-21 ENCOUNTER — Ambulatory Visit
Admission: RE | Admit: 2013-07-21 | Discharge: 2013-07-21 | Disposition: A | Discharge status: Home / Self Care | Payer: Medicare Other | Source: Ambulatory Visit | Attending: Pain Medicine | Admitting: Pain Medicine

## 2013-07-21 DIAGNOSIS — M5020 Other cervical disc displacement, unspecified cervical region: Secondary | ICD-10-CM | POA: Diagnosis not present

## 2013-07-21 DIAGNOSIS — M4802 Spinal stenosis, cervical region: Secondary | ICD-10-CM | POA: Diagnosis not present

## 2013-07-21 DIAGNOSIS — M4310 Spondylolisthesis, site unspecified: Secondary | ICD-10-CM | POA: Diagnosis not present

## 2013-07-21 DIAGNOSIS — M5410 Radiculopathy, site unspecified: Secondary | ICD-10-CM

## 2013-07-21 DIAGNOSIS — M5126 Other intervertebral disc displacement, lumbar region: Secondary | ICD-10-CM | POA: Diagnosis not present

## 2013-07-21 DIAGNOSIS — M545 Low back pain, unspecified: Secondary | ICD-10-CM

## 2013-07-21 DIAGNOSIS — M542 Cervicalgia: Secondary | ICD-10-CM

## 2013-07-21 DIAGNOSIS — M47817 Spondylosis without myelopathy or radiculopathy, lumbosacral region: Secondary | ICD-10-CM | POA: Diagnosis not present

## 2013-07-21 DIAGNOSIS — M47812 Spondylosis without myelopathy or radiculopathy, cervical region: Secondary | ICD-10-CM | POA: Diagnosis not present

## 2013-07-27 ENCOUNTER — Ambulatory Visit: Payer: Medicare Other

## 2013-08-03 DIAGNOSIS — G894 Chronic pain syndrome: Secondary | ICD-10-CM | POA: Diagnosis not present

## 2013-08-03 DIAGNOSIS — M5137 Other intervertebral disc degeneration, lumbosacral region: Secondary | ICD-10-CM | POA: Diagnosis not present

## 2013-08-03 DIAGNOSIS — IMO0001 Reserved for inherently not codable concepts without codable children: Secondary | ICD-10-CM | POA: Diagnosis not present

## 2013-08-03 DIAGNOSIS — Z79899 Other long term (current) drug therapy: Secondary | ICD-10-CM | POA: Diagnosis not present

## 2013-08-08 IMAGING — CR DG HIP (WITH OR WITHOUT PELVIS) 2-3V*L*
4 series · 4 of 4 positions shown · non-contrast
Comparison: 09/29/2006 abdominal radiographs.

CLINICAL DATA: Left hip pain following fall.

LEFT HIP - COMPLETE 2+ VIEW

[t pelvis ap]
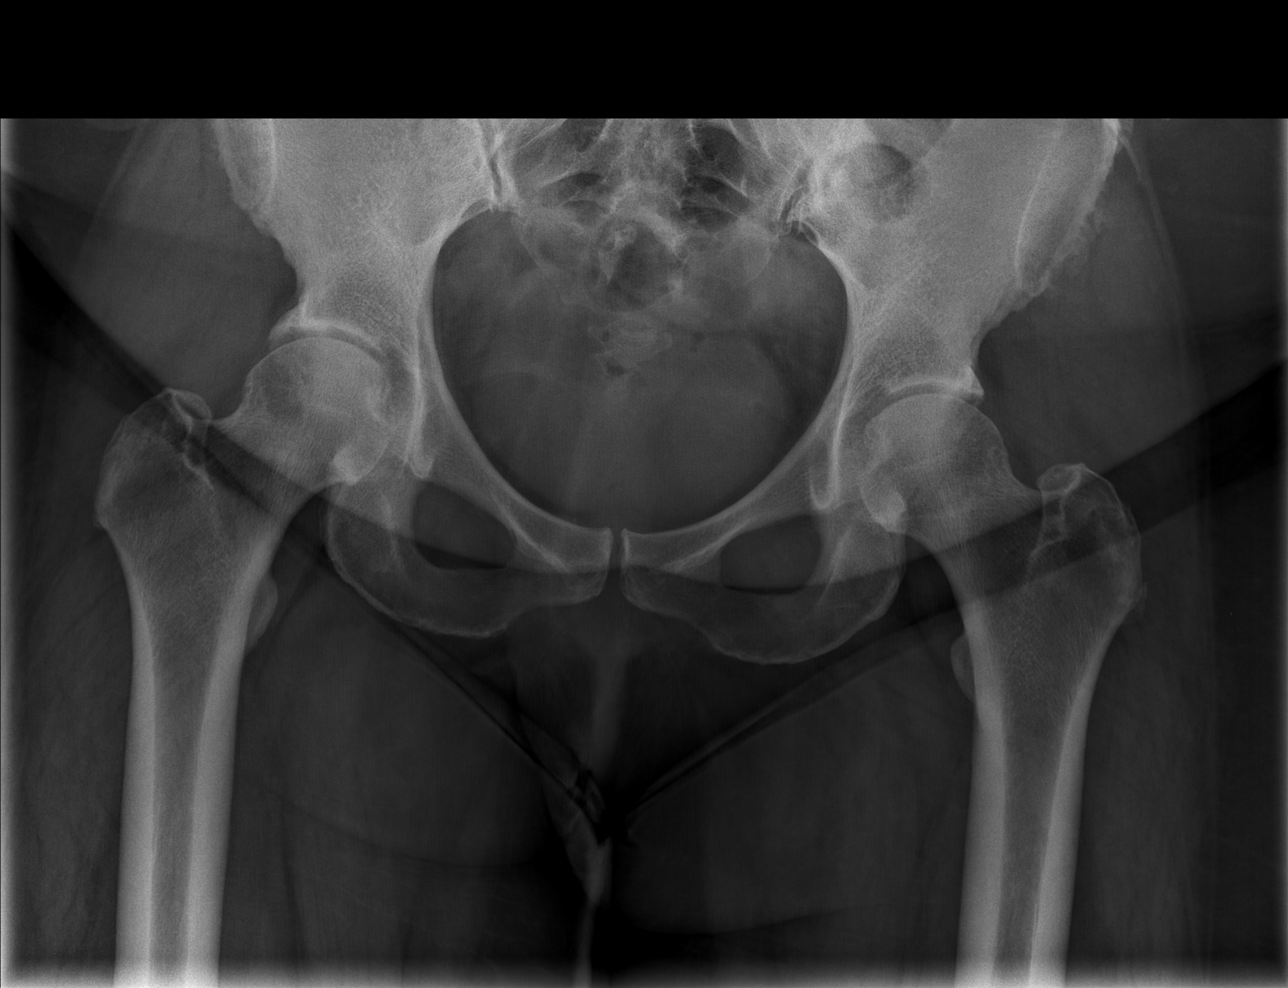

[t hip ap left (1 of 2)]
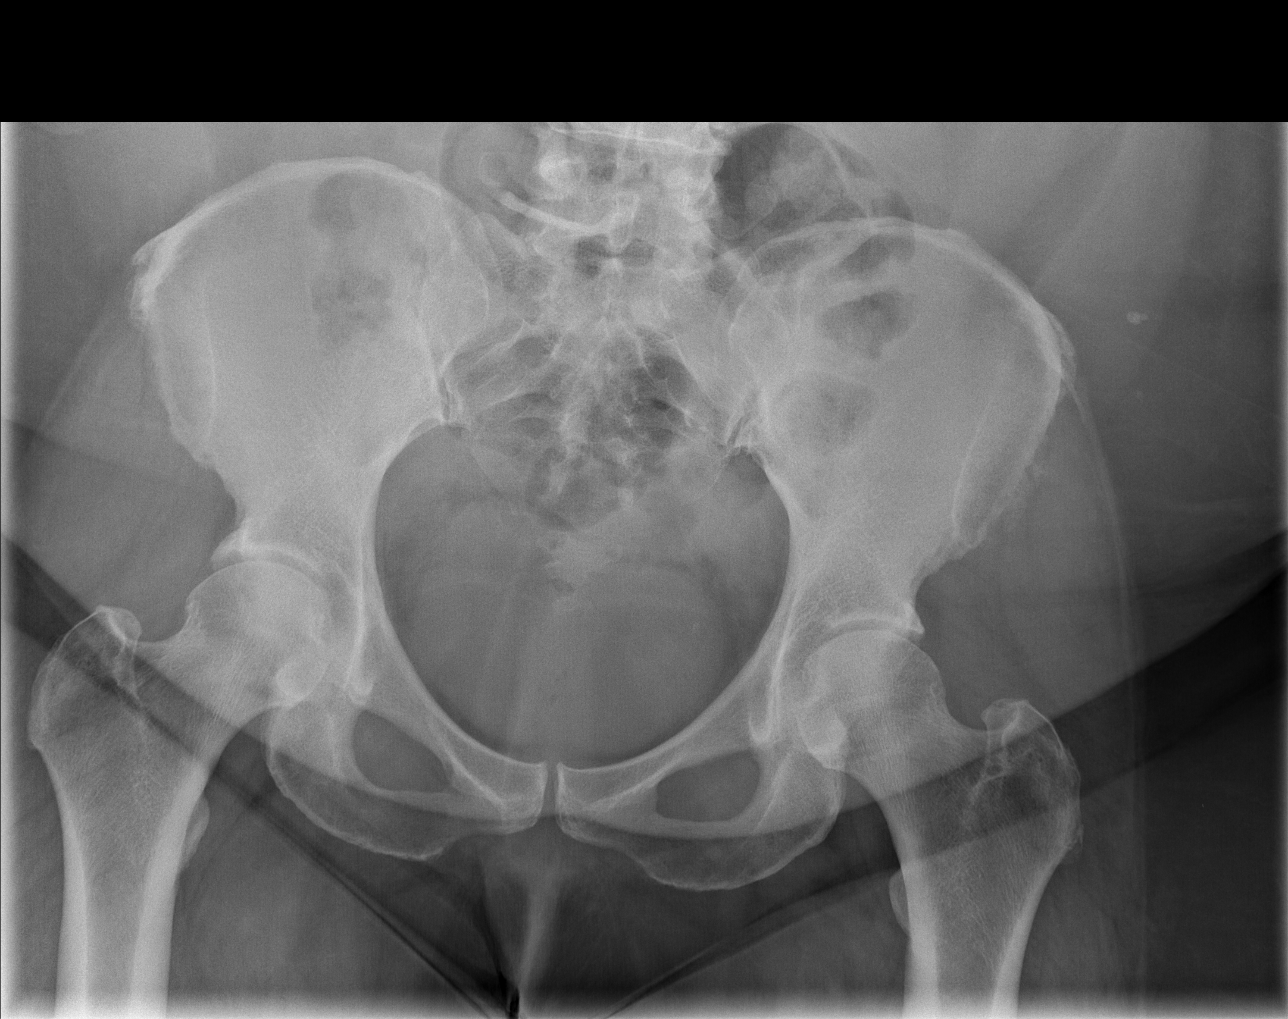

[t hip ap left (2 of 2)]
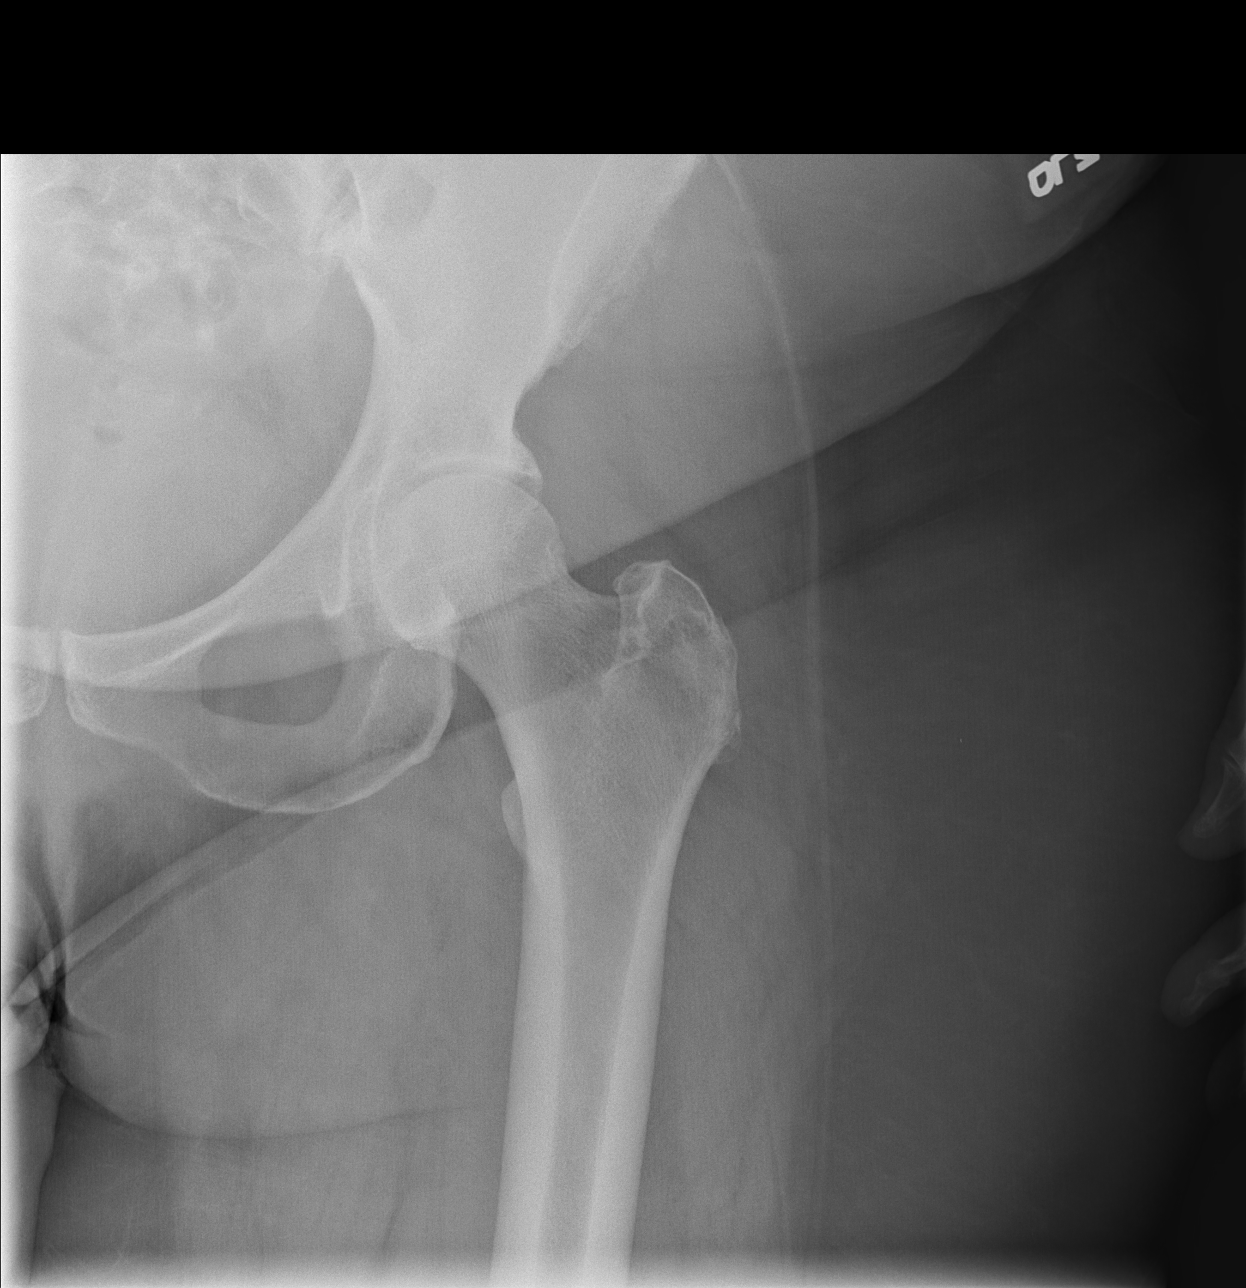

[t hip frog leg left]
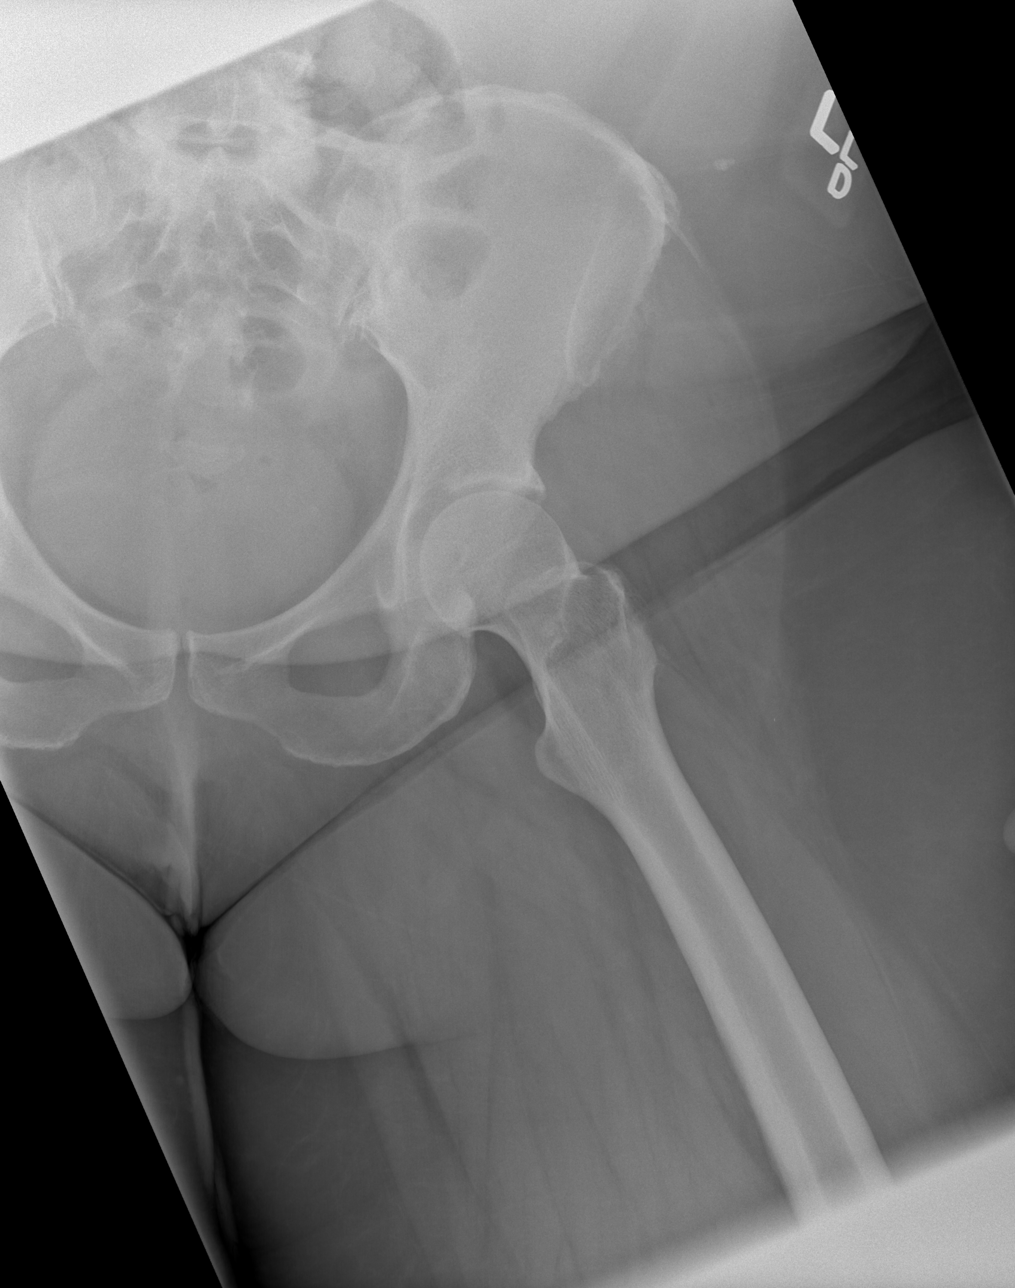

[4 of 4 positions shown; findings below may reference images not displayed]

FINDINGS: There is no evidence of fracture, subluxation or
dislocation.
Mild degenerative changes within both hips are again noted.
No focal bony lesions are present.
Mild degenerative changes in the lower lumbar spine are noted.
IMPRESSION: No evidence of acute bony abnormality.

Mild degenerative changes in both hips.

## 2013-08-28 ENCOUNTER — Encounter (HOSPITAL_COMMUNITY): Payer: Self-pay | Admitting: Emergency Medicine

## 2013-08-28 ENCOUNTER — Emergency Department (HOSPITAL_COMMUNITY)
Admission: EM | Admit: 2013-08-28 | Discharge: 2013-08-29 | Disposition: A | Discharge status: Home / Self Care | Payer: Medicare Other | Attending: Emergency Medicine | Admitting: Emergency Medicine

## 2013-08-28 ENCOUNTER — Emergency Department (HOSPITAL_COMMUNITY): Payer: Medicare Other

## 2013-08-28 DIAGNOSIS — G8929 Other chronic pain: Secondary | ICD-10-CM | POA: Diagnosis not present

## 2013-08-28 DIAGNOSIS — F3289 Other specified depressive episodes: Secondary | ICD-10-CM | POA: Diagnosis not present

## 2013-08-28 DIAGNOSIS — M79603 Pain in arm, unspecified: Secondary | ICD-10-CM

## 2013-08-28 DIAGNOSIS — R6889 Other general symptoms and signs: Secondary | ICD-10-CM | POA: Diagnosis not present

## 2013-08-28 DIAGNOSIS — R5383 Other fatigue: Secondary | ICD-10-CM

## 2013-08-28 DIAGNOSIS — Z8742 Personal history of other diseases of the female genital tract: Secondary | ICD-10-CM | POA: Insufficient documentation

## 2013-08-28 DIAGNOSIS — Z79899 Other long term (current) drug therapy: Secondary | ICD-10-CM | POA: Diagnosis not present

## 2013-08-28 DIAGNOSIS — I10 Essential (primary) hypertension: Secondary | ICD-10-CM | POA: Insufficient documentation

## 2013-08-28 DIAGNOSIS — R5381 Other malaise: Secondary | ICD-10-CM | POA: Diagnosis not present

## 2013-08-28 DIAGNOSIS — R0789 Other chest pain: Secondary | ICD-10-CM | POA: Insufficient documentation

## 2013-08-28 DIAGNOSIS — K219 Gastro-esophageal reflux disease without esophagitis: Secondary | ICD-10-CM | POA: Diagnosis not present

## 2013-08-28 DIAGNOSIS — M79609 Pain in unspecified limb: Secondary | ICD-10-CM | POA: Diagnosis not present

## 2013-08-28 DIAGNOSIS — J449 Chronic obstructive pulmonary disease, unspecified: Secondary | ICD-10-CM | POA: Insufficient documentation

## 2013-08-28 DIAGNOSIS — Z85038 Personal history of other malignant neoplasm of large intestine: Secondary | ICD-10-CM | POA: Insufficient documentation

## 2013-08-28 DIAGNOSIS — R631 Polydipsia: Secondary | ICD-10-CM | POA: Insufficient documentation

## 2013-08-28 DIAGNOSIS — F411 Generalized anxiety disorder: Secondary | ICD-10-CM | POA: Diagnosis not present

## 2013-08-28 DIAGNOSIS — R51 Headache: Secondary | ICD-10-CM | POA: Diagnosis not present

## 2013-08-28 DIAGNOSIS — F17200 Nicotine dependence, unspecified, uncomplicated: Secondary | ICD-10-CM | POA: Insufficient documentation

## 2013-08-28 DIAGNOSIS — M1990 Unspecified osteoarthritis, unspecified site: Secondary | ICD-10-CM | POA: Diagnosis not present

## 2013-08-28 DIAGNOSIS — IMO0002 Reserved for concepts with insufficient information to code with codable children: Secondary | ICD-10-CM | POA: Insufficient documentation

## 2013-08-28 DIAGNOSIS — F329 Major depressive disorder, single episode, unspecified: Secondary | ICD-10-CM | POA: Insufficient documentation

## 2013-08-28 DIAGNOSIS — J4489 Other specified chronic obstructive pulmonary disease: Secondary | ICD-10-CM | POA: Insufficient documentation

## 2013-08-28 LAB — CBC
HCT: 42.6 % (ref 36.0–46.0)
Hemoglobin: 14 g/dL (ref 12.0–15.0)
MCH: 31.7 pg (ref 26.0–34.0)
MCHC: 32.9 g/dL (ref 30.0–36.0)
MCV: 96.4 fL (ref 78.0–100.0)
PLATELETS: 193 10*3/uL (ref 150–400)
RBC: 4.42 MIL/uL (ref 3.87–5.11)
RDW: 13.3 % (ref 11.5–15.5)
WBC: 6.8 10*3/uL (ref 4.0–10.5)

## 2013-08-28 LAB — DIFFERENTIAL
BASOS PCT: 0 % (ref 0–1)
Basophils Absolute: 0 10*3/uL (ref 0.0–0.1)
EOS ABS: 0.1 10*3/uL (ref 0.0–0.7)
Eosinophils Relative: 2 % (ref 0–5)
LYMPHS ABS: 2.8 10*3/uL (ref 0.7–4.0)
Lymphocytes Relative: 42 % (ref 12–46)
Monocytes Absolute: 0.6 10*3/uL (ref 0.1–1.0)
Monocytes Relative: 9 % (ref 3–12)
Neutro Abs: 3.3 10*3/uL (ref 1.7–7.7)
Neutrophils Relative %: 48 % (ref 43–77)

## 2013-08-28 LAB — COMPREHENSIVE METABOLIC PANEL
ALT: 11 U/L (ref 0–35)
AST: 16 U/L (ref 0–37)
Albumin: 3.4 g/dL — ABNORMAL LOW (ref 3.5–5.2)
Alkaline Phosphatase: 87 U/L (ref 39–117)
BUN: 15 mg/dL (ref 6–23)
CALCIUM: 9.6 mg/dL (ref 8.4–10.5)
CO2: 32 mEq/L (ref 19–32)
Chloride: 101 mEq/L (ref 96–112)
Creatinine, Ser: 0.81 mg/dL (ref 0.50–1.10)
GFR calc non Af Amer: 80 mL/min — ABNORMAL LOW (ref >90)
GLUCOSE: 100 mg/dL — AB (ref 70–99)
Potassium: 3 mEq/L — ABNORMAL LOW (ref 3.7–5.3)
SODIUM: 145 meq/L (ref 137–147)
Total Bilirubin: 0.2 mg/dL — ABNORMAL LOW (ref 0.3–1.2)
Total Protein: 7.3 g/dL (ref 6.0–8.3)

## 2013-08-28 LAB — I-STAT TROPONIN, ED: TROPONIN I, POC: 0.01 ng/mL (ref 0.00–0.08)

## 2013-08-28 LAB — RAPID URINE DRUG SCREEN, HOSP PERFORMED
Amphetamines: NOT DETECTED
Barbiturates: NOT DETECTED
Benzodiazepines: NOT DETECTED
Cocaine: NOT DETECTED
Opiates: POSITIVE — AB
Tetrahydrocannabinol: NOT DETECTED

## 2013-08-28 LAB — I-STAT CHEM 8, ED
BUN: 14 mg/dL (ref 6–23)
Calcium, Ion: 1.13 mmol/L (ref 1.12–1.23)
Chloride: 98 mEq/L (ref 96–112)
Creatinine, Ser: 1 mg/dL (ref 0.50–1.10)
Glucose, Bld: 97 mg/dL (ref 70–99)
HCT: 47 % — ABNORMAL HIGH (ref 36.0–46.0)
HEMOGLOBIN: 16 g/dL — AB (ref 12.0–15.0)
Potassium: 2.7 mEq/L — CL (ref 3.7–5.3)
SODIUM: 144 meq/L (ref 137–147)
TCO2: 33 mmol/L (ref 0–100)

## 2013-08-28 LAB — PROTIME-INR
INR: 0.94 (ref 0.00–1.49)
PROTHROMBIN TIME: 12.4 s (ref 11.6–15.2)

## 2013-08-28 LAB — URINALYSIS, ROUTINE W REFLEX MICROSCOPIC
BILIRUBIN URINE: NEGATIVE
GLUCOSE, UA: NEGATIVE mg/dL
HGB URINE DIPSTICK: NEGATIVE
Ketones, ur: NEGATIVE mg/dL
Leukocytes, UA: NEGATIVE
Nitrite: NEGATIVE
PROTEIN: NEGATIVE mg/dL
Specific Gravity, Urine: 1.031 — ABNORMAL HIGH (ref 1.005–1.030)
Urobilinogen, UA: 1 mg/dL (ref 0.0–1.0)
pH: 6 (ref 5.0–8.0)

## 2013-08-28 LAB — APTT: aPTT: 30 seconds (ref 24–37)

## 2013-08-28 LAB — ETHANOL: Alcohol, Ethyl (B): <11 mg/dL (ref 0–11)

## 2013-08-28 MED ORDER — ONDANSETRON 8 MG PO TBDP
8.0000 mg | ORAL_TABLET | Freq: Once | ORAL | Status: AC
  Administered 2013-08-28: 8 mg via ORAL
  Filled 2013-08-28: qty 1

## 2013-08-28 MED ORDER — OXYCODONE-ACETAMINOPHEN 5-325 MG PO TABS
2.0000 | ORAL_TABLET | Freq: Once | ORAL | Status: AC
  Administered 2013-08-28: 2 via ORAL
  Filled 2013-08-28: qty 2

## 2013-08-28 MED ORDER — HYDROMORPHONE HCL PF 1 MG/ML IJ SOLN
1.0000 mg | Freq: Once | INTRAMUSCULAR | Status: AC
  Administered 2013-08-29: 1 mg via INTRAVENOUS
  Filled 2013-08-28: qty 1

## 2013-08-28 MED ORDER — POTASSIUM CHLORIDE CRYS ER 20 MEQ PO TBCR
40.0000 meq | EXTENDED_RELEASE_TABLET | Freq: Once | ORAL | Status: AC
  Administered 2013-08-28: 40 meq via ORAL
  Filled 2013-08-28: qty 2

## 2013-08-28 NOTE — ED Provider Notes (Signed)
CSN: [id number]     Arrival date & time 08/28/13  2026 History   First MD Initiated Contact with Patient 08/28/13 2112     Chief Complaint  Patient presents with  . Stroke Symptoms     (Consider location/radiation/quality/duration/timing/severity/associated sxs/prior Treatment) HPI Comments: Patient is a 58 year old female with history of hypertension, asthma, COPD, anxiety, colon cancer dyslipidemia, sleep apnea, chronic pain who presents today with multiple complaints. Her daughter reports that 2 days ago she had a "twisted face all day". She would try to speak and was successful, but her face always went back to being twisted. She has had associated generalized fatigue and weakness. She has chronic pain all over her body. She has worsening polydipsia. She denies polyphagia or polyuria. She has had intermittent chest pain for "awhile". The pain feels like pins. Nothing seems to trigger this pain. When it comes on it lasts for a few minutes. Her chest is tender to the touch. She denies headaches, speech difficulty, polyuria, polyphagia. She has had increased stressed recently. She has had family members pass recently and has been worrying about that as well as "all the bad people in the world".   The history is provided by the patient and a relative. No language interpreter was used.    Past Medical History  Diagnosis Date  . Hypertension   . Asthma   . COPD (chronic obstructive pulmonary disease)   . Anxiety   . Cancer   . Colon cancer   . Dyslipidemia   . Migraine headache   . Chronic bronchitis   . Uterine fibroid   . Ovarian cyst   . GERD (gastroesophageal reflux disease)   . Morbid obesity   . Obstructive sleep apnea     mild  . Chronic pain   . Chronic back pain   . Arthritis   . Osteoarthritis   . Depression    Past Surgical History  Procedure Laterality Date  . Knee arthroscopy      bil.  . Carpal tunnel release      rt  . Mouth surgery      teeth extraction  .  Cesarean section    . Subtotal colectomy    . Colonoscopy    . Tubal ligation     Family History  Problem Relation Age of Onset  . Uterine cancer Mother   . Stroke Father   . Colon cancer Maternal Uncle   . Diabetes Father   . Ovarian cancer Mother   . Hypertension Father   . Breast cancer Maternal Grandmother   . Diabetes Sister   . Asthma Child   . Asthma Child    History  Substance Use Topics  . Smoking status: Current Some Day Smoker -- 1.00 packs/day for 30 years    Types: Cigarettes  . Smokeless tobacco: Never Used     Comment: tobacco info given 07/21/2011  . Alcohol Use: No   OB History   Grav Para Term Preterm Abortions TAB SAB Ect Mult Living   2 2 2       2      Review of Systems  Constitutional: Positive for fatigue. Negative for fever and chills.  Respiratory: Negative for shortness of breath.   Cardiovascular: Positive for chest pain.  Gastrointestinal: Negative for nausea, vomiting and abdominal pain.  Endocrine: Positive for polydipsia. Negative for polyphagia and polyuria.  Musculoskeletal: Positive for arthralgias and myalgias.  Neurological: Positive for weakness (generalized). Negative for speech difficulty and  headaches.       "facial twisting"  Psychiatric/Behavioral: The patient is nervous/anxious.   All other systems reviewed and are negative.      Allergies  Kiwi extract and Aspirin  Home Medications   Current Outpatient Rx  Name  Route  Sig  Dispense  Refill  . albuterol (PROVENTIL HFA;VENTOLIN HFA) 108 (90 BASE) MCG/ACT inhaler   Inhalation   Inhale 2 puffs into the lungs every 6 (six) hours as needed for shortness of breath. For shortness of breeath   3.7 g   1   . budesonide-formoterol (SYMBICORT) 160-4.5 MCG/ACT inhaler   Inhalation   Inhale 2 puffs into the lungs 2 (two) times daily.   1 Inhaler   1   . citalopram (CELEXA) 20 MG tablet   Oral   Take 1 tablet by mouth daily.         . furosemide (LASIX) 40 MG tablet    Oral   Take 1 tablet (40 mg total) by mouth daily.   120 tablet   2   . hydrochlorothiazide (HYDRODIURIL) 50 MG tablet   Oral   Take 1 tablet by mouth daily.         Marland Kitchen ibuprofen (ADVIL,MOTRIN) 800 MG tablet   Oral   Take 1 tablet by mouth 2 (two) times daily as needed.         Marland Kitchen LORazepam (ATIVAN) 1 MG tablet   Oral   Take 1 mg by mouth 3 (three) times daily.         Marland Kitchen omeprazole (PRILOSEC) 40 MG capsule   Oral   Take 40 mg by mouth daily.          Marland Kitchen oxyCODONE-acetaminophen (PERCOCET/ROXICET) 5-325 MG per tablet   Oral   Take 1-2 tablets by mouth every 6 (six) hours as needed for severe pain.   15 tablet   0   . tiZANidine (ZANAFLEX) 4 MG tablet   Oral   Take 1 tablet by mouth 3 (three) times daily as needed. spasms         . traZODone (DESYREL) 50 MG tablet   Oral   Take 50 mg by mouth at bedtime.           BP 133/82  Pulse 78  Temp(Src) 98.3 F (36.8 C) (Oral)  Resp 20  SpO2 96%  LMP 05/24/2003 Physical Exam  Nursing note and vitals reviewed. Constitutional: She is oriented to person, place, and time. She appears well-developed and well-nourished.  Non-toxic appearance. She does not have a sickly appearance. She does not appear ill. No distress.  Morbidly obese  HENT:  Head: Normocephalic and atraumatic.  Right Ear: External ear normal.  Left Ear: External ear normal.  Nose: Nose normal.  Mouth/Throat: Uvula is midline and oropharynx is clear and moist. Mucous membranes are dry.  Eyes: Conjunctivae and EOM are normal. Pupils are equal, round, and reactive to light.  Neck: Normal range of motion.  No nuchal rigidity  Cardiovascular: Normal rate, regular rhythm, normal heart sounds, intact distal pulses and normal pulses.   Pulses:      Radial pulses are 2+ on the right side, and 2+ on the left side.       Posterior tibial pulses are 2+ on the right side, and 2+ on the left side.  Pulmonary/Chest: Effort normal and breath sounds normal. No  stridor. No respiratory distress. She has no wheezes. She has no rales. She exhibits tenderness.    Abdominal: Soft.  She exhibits no distension. There is no tenderness.  Musculoskeletal: Normal range of motion.  Strength 5/5 in all extremities Tender to palpation diffusely over right arm, worse in the shoulder  Neurological: She is alert and oriented to person, place, and time. She has normal strength. No sensory deficit. Coordination and gait normal.  Unable to assess pronator drift as patient has too much pain with extension of right arm. Finger nose finger normal. No facial droop.   Skin: Skin is warm and dry. She is not diaphoretic. No erythema.  Psychiatric: Her behavior is normal. Her mood appears anxious.    ED Course  Procedures (including critical care time) Labs Review Labs Reviewed  COMPREHENSIVE METABOLIC PANEL - Abnormal; Notable for the following:    Potassium 3.0 (*)    Glucose, Bld 100 (*)    Albumin 3.4 (*)    Total Bilirubin 0.2 (*)    GFR calc non Af Amer 80 (*)    All other components within normal limits  URINE RAPID DRUG SCREEN (HOSP PERFORMED) - Abnormal; Notable for the following:    Opiates POSITIVE (*)    All other components within normal limits  URINALYSIS, ROUTINE W REFLEX MICROSCOPIC - Abnormal; Notable for the following:    Specific Gravity, Urine 1.031 (*)    All other components within normal limits  I-STAT CHEM 8, ED - Abnormal; Notable for the following:    Potassium 2.7 (*)    Hemoglobin 16.0 (*)    HCT 47.0 (*)    All other components within normal limits  ETHANOL  PROTIME-INR  APTT  CBC  DIFFERENTIAL  I-STAT TROPOININ, ED  I-STAT TROPOININ, ED   Imaging Review Ct Head Wo Contrast  08/28/2013   CLINICAL DATA:  Headache.  EXAM: CT HEAD WITHOUT CONTRAST  TECHNIQUE: Contiguous axial images were obtained from the base of the skull through the vertex without intravenous contrast.  COMPARISON:  Head CT 04/06/2009.  FINDINGS: Physiologic  calcifications of the basal ganglia bilaterally (unchanged). No acute intracranial abnormalities. Specifically, no evidence of acute intracranial hemorrhage, no definite findings of acute/subacute cerebral ischemia, no mass, mass effect, hydrocephalus or abnormal intra or extra-axial fluid collections. Visualized paranasal sinuses and mastoids are well pneumatized. No acute displaced skull fractures are identified.  IMPRESSION: 1. No acute intracranial abnormalities.   Electronically Signed   By: Vinnie Langton M.D.   On: 08/28/2013 22:20     EKG Interpretation None      MDM   Final diagnoses:  Arm pain  Fatigue    Patient presents to ED for evaluation of bilateral arm pain and generalized fatigue as well as polydipsia. Polydipsia likely due to dehydration as UA specific gravity is high. Arm pain is worse with certain movements and tender to palpation. Likely MSK in nature. No explanation at this time for patient's fatigue, although I do not think it has an emergent cause that needs further investigation tonight. Daughter is concerned patient had a stroke. Head CT negative. I have very low suspicion for TIA and do not feel as though patient needs further workup at this time. Patient is appropriate for discharge and follow up with PCP. Discussed case with Dr. Ashok Cordia who agrees with plan. Return instructions given. Patient / Family / Caregiver informed of clinical course, understand medical decision-making process, and agree with plan.     Elwyn Lade, PA-C 08/29/13 1316

## 2013-08-28 NOTE — ED Notes (Signed)
Pt daughter states pt face looked twisted two days ago. Pt states she has been tired and having right arm pain occasionally pain occurs in both arms. Pt reports she has been under stress and thinks she could have had a stroke today.

## 2013-08-28 NOTE — ED Notes (Signed)
EDP aware of critical lab results

## 2013-08-29 MED ORDER — SODIUM CHLORIDE 0.9 % IV BOLUS (SEPSIS)
500.0000 mL | Freq: Once | INTRAVENOUS | Status: AC
  Administered 2013-08-29: 500 mL via INTRAVENOUS

## 2013-08-29 NOTE — Discharge Instructions (Signed)
Fatigue °Fatigue is a feeling of tiredness, lack of energy, lack of motivation, or feeling tired all the time. Having enough rest, good nutrition, and reducing stress will normally reduce fatigue. Consult your caregiver if it persists. The nature of your fatigue will help your caregiver to find out its cause. The treatment is based on the cause.  °CAUSES  °There are many causes for fatigue. Most of the time, fatigue can be traced to one or more of your habits or routines. Most causes fit into one or more of three general areas. They are: °Lifestyle problems °· Sleep disturbances. °· Overwork. °· Physical exertion. °· Unhealthy habits. °· Poor eating habits or eating disorders. °· Alcohol and/or drug use . °· Lack of proper nutrition (malnutrition). °Psychological problems °· Stress and/or anxiety problems. °· Depression. °· Grief. °· Boredom. °Medical Problems or Conditions °· Anemia. °· Pregnancy. °· Thyroid gland problems. °· Recovery from major surgery. °· Continuous pain. °· Emphysema or asthma that is not well controlled °· Allergic conditions. °· Diabetes. °· Infections (such as mononucleosis). °· Obesity. °· Sleep disorders, such as sleep apnea. °· Heart failure or other heart-related problems. °· Cancer. °· Kidney disease. °· Liver disease. °· Effects of certain medicines such as antihistamines, cough and cold remedies, prescription pain medicines, heart and blood pressure medicines, drugs used for treatment of cancer, and some antidepressants. °SYMPTOMS  °The symptoms of fatigue include:  °· Lack of energy. °· Lack of drive (motivation). °· Drowsiness. °· Feeling of indifference to the surroundings. °DIAGNOSIS  °The details of how you feel help guide your caregiver in finding out what is causing the fatigue. You will be asked about your present and past health condition. It is important to review all medicines that you take, including prescription and non-prescription items. A thorough exam will be done.  You will be questioned about your feelings, habits, and normal lifestyle. Your caregiver may suggest blood tests, urine tests, or other tests to look for common medical causes of fatigue.  °TREATMENT  °Fatigue is treated by correcting the underlying cause. For example, if you have continuous pain or depression, treating these causes will improve how you feel. Similarly, adjusting the dose of certain medicines will help in reducing fatigue.  °HOME CARE INSTRUCTIONS  °· Try to get the required amount of good sleep every night. °· Eat a healthy and nutritious diet, and drink enough water throughout the day. °· Practice ways of relaxing (including yoga or meditation). °· Exercise regularly. °· Make plans to change situations that cause stress. Act on those plans so that stresses decrease over time. Keep your work and personal routine reasonable. °· Avoid street drugs and minimize use of alcohol. °· Start taking a daily multivitamin after consulting your caregiver. °SEEK MEDICAL CARE IF:  °· You have persistent tiredness, which cannot be accounted for. °· You have fever. °· You have unintentional weight loss. °· You have headaches. °· You have disturbed sleep throughout the night. °· You are feeling sad. °· You have constipation. °· You have dry skin. °· You have gained weight. °· You are taking any new or different medicines that you suspect are causing fatigue. °· You are unable to sleep at night. °· You develop any unusual swelling of your legs or other parts of your body. °SEEK IMMEDIATE MEDICAL CARE IF:  °· You are feeling confused. °· Your vision is blurred. °· You feel faint or pass out. °· You develop severe headache. °· You develop severe abdominal, pelvic, or   back pain.  You develop chest pain, shortness of breath, or an irregular or fast heartbeat.  You are unable to pass a normal amount of urine.  You develop abnormal bleeding such as bleeding from the rectum or you vomit blood.  You have thoughts  about harming yourself or committing suicide.  You are worried that you might harm someone else. MAKE SURE YOU:   Understand these instructions.  Will watch your condition.  Will get help right away if you are not doing well or get worse. Document Released: 03/23/2007 Document Revised: 08/18/2011 Document Reviewed: 03/23/2007 [facility] Patient Information 2014 [place].  Musculoskeletal Pain Musculoskeletal pain is muscle and boney aches and pains. These pains can occur in any part of the body. Your caregiver may treat you without knowing the cause of the pain. They may treat you if blood or urine tests, X-rays, and other tests were normal.  CAUSES There is often not a definite cause or reason for these pains. These pains may be caused by a type of germ (virus). The discomfort may also come from overuse. Overuse includes working out too hard when your body is not fit. Boney aches also come from weather changes. Bone is sensitive to atmospheric pressure changes. HOME CARE INSTRUCTIONS   Ask when your test results will be ready. Make sure you get your test results.  Only take over-the-counter or prescription medicines for pain, discomfort, or fever as directed by your caregiver. If you were given medications for your condition, do not drive, operate machinery or power tools, or sign legal documents for 24 hours. Do not drink alcohol. Do not take sleeping pills or other medications that may interfere with treatment.  Continue all activities unless the activities cause more pain. When the pain lessens, slowly resume normal activities. Gradually increase the intensity and duration of the activities or exercise.  During periods of severe pain, bed rest may be helpful. Lay or sit in any position that is comfortable.  Putting ice on the injured area.  Put ice in a bag.  Place a towel between your skin and the bag.  Leave the ice on for 15 to 20 minutes, 3 to 4 times a day.  Follow up  with your caregiver for continued problems and no reason can be found for the pain. If the pain becomes worse or does not go away, it may be necessary to repeat tests or do additional testing. Your caregiver may need to look further for a possible cause. SEEK IMMEDIATE MEDICAL CARE IF:  You have pain that is getting worse and is not relieved by medications.  You develop chest pain that is associated with shortness or breath, sweating, feeling sick to your stomach (nauseous), or throw up (vomit).  Your pain becomes localized to the abdomen.  You develop any new symptoms that seem different or that concern you. MAKE SURE YOU:   Understand these instructions.  Will watch your condition.  Will get help right away if you are not doing well or get worse. Document Released: 05/26/2005 Document Revised: 08/18/2011 Document Reviewed: 01/28/2013 [facility] Patient Information 2014 [place].

## 2013-08-30 NOTE — ED Provider Notes (Signed)
Medical screening examination/treatment/procedure(s) were conducted as a shared visit with non-physician practitioner(s) and myself.  I personally evaluated the patient during the encounter.   EKG Interpretation   Date/Time:  Sunday August 28 2013 20:45:40 EDT Ventricular Rate:  101 PR Interval:  147 QRS Duration: 80 QT Interval:  361 QTC Calculation: 468 R Axis:   37 Text Interpretation:  Sinus tachycardia Paired ventricular premature  complexes Minimal ST depression, diffuse leads ED PHYSICIAN INTERPRETATION  AVAILABLE IN [facility] Confirmed by TEST, Record ([id number]) on  08/30/2013 10:05:54 AM      Pt states face felt twisted earlier. Currently no facial asymmetry or droop. Ambulatory about room. Labs.   Mirna Mires, MD 08/30/13 1115

## 2013-08-31 ENCOUNTER — Other Ambulatory Visit: Payer: Self-pay | Admitting: Internal Medicine

## 2013-08-31 DIAGNOSIS — Z79899 Other long term (current) drug therapy: Secondary | ICD-10-CM | POA: Diagnosis not present

## 2013-08-31 DIAGNOSIS — G894 Chronic pain syndrome: Secondary | ICD-10-CM | POA: Diagnosis not present

## 2013-08-31 DIAGNOSIS — M5030 Other cervical disc degeneration, unspecified cervical region: Secondary | ICD-10-CM | POA: Diagnosis not present

## 2013-08-31 DIAGNOSIS — IMO0002 Reserved for concepts with insufficient information to code with codable children: Secondary | ICD-10-CM | POA: Diagnosis not present

## 2013-09-01 NOTE — Telephone Encounter (Signed)
Patient requesting refill on ativan.

## 2013-09-05 ENCOUNTER — Other Ambulatory Visit: Payer: Self-pay | Admitting: Internal Medicine

## 2013-09-06 ENCOUNTER — Ambulatory Visit: Payer: Medicare Other | Admitting: Internal Medicine

## 2013-09-06 NOTE — Telephone Encounter (Signed)
Patient requesting refill on ativan.

## 2013-09-08 ENCOUNTER — Other Ambulatory Visit: Payer: Self-pay

## 2013-09-08 ENCOUNTER — Telehealth: Payer: Self-pay

## 2013-09-08 MED ORDER — OMEPRAZOLE 40 MG PO CPDR: 40.0000 mg | DELAYED_RELEASE_CAPSULE | Freq: Every day | ORAL | Status: DC

## 2013-09-08 MED ORDER — CITALOPRAM HYDROBROMIDE 20 MG PO TABS: 20.0000 mg | ORAL_TABLET | Freq: Every day | ORAL | Status: DC

## 2013-09-08 NOTE — Telephone Encounter (Signed)
Patient called stating her next appointment is not till the middle of April And needs refills on her celexa and omeprazole Sent prescription to pharmacy on file

## 2013-09-13 IMAGING — MR MR LUMBAR SPINE W/O CM
4 of 5 series · 26 of 48 positions shown · non-contrast
Comparison: None.

CLINICAL DATA: Chronic low back pain

MRI LUMBAR SPINE WITHOUT CONTRAST
TECHNIQUE: Multiplanar and multiecho pulse sequences of the lumbar
spine were obtained without intravenous contrast.

[Series 3: T2 · sagittal · 4.0mm · 0.55mm/px · 6 of 14 slices shown (1 of 2)]
[im 1/14]
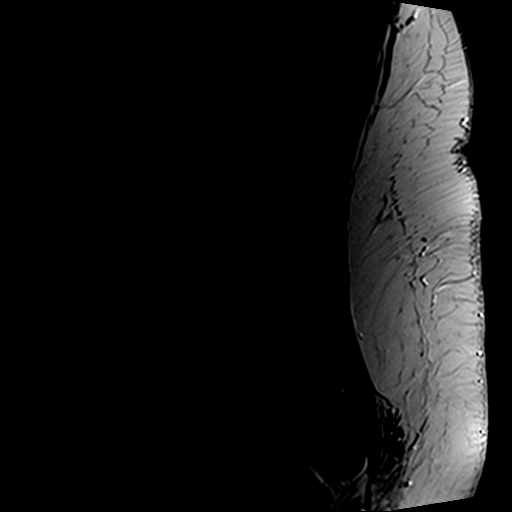
[im 3/14]
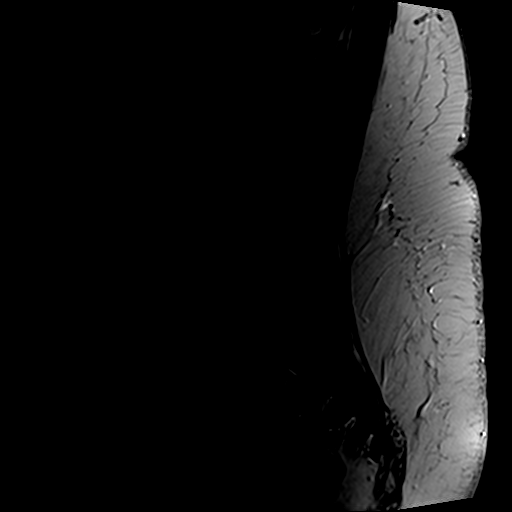
[im 6/14]
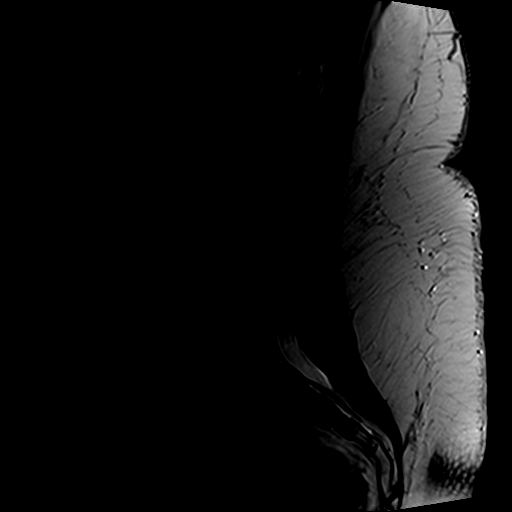
[im 8/14]
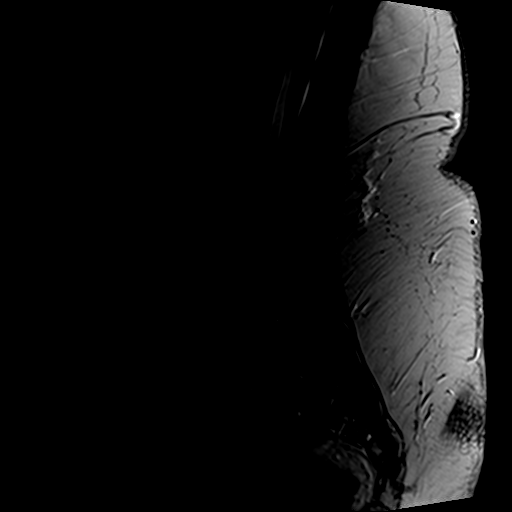
[im 11/14]
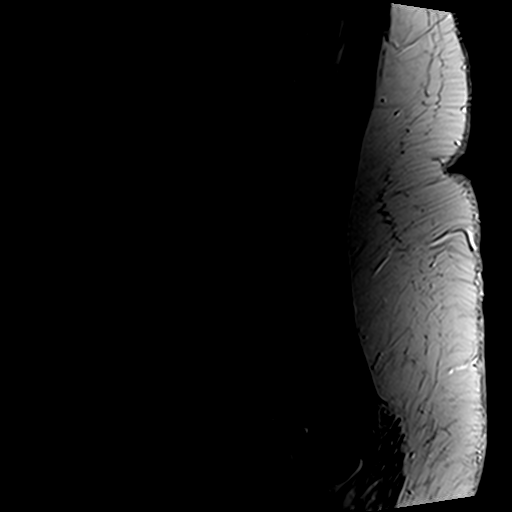
[im 14/14]
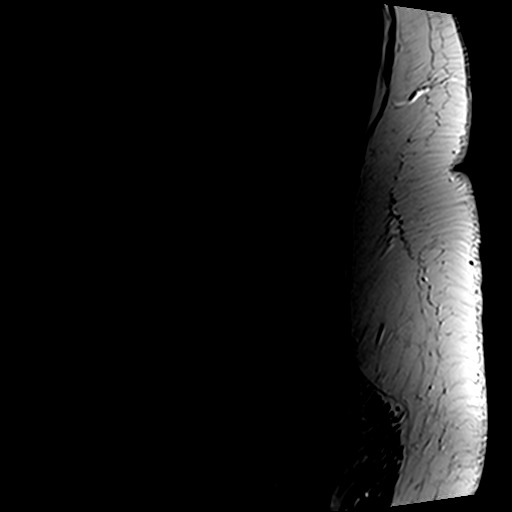

[Series 4: T1 · sagittal · 4.0mm · 0.55mm/px · 6 of 14 slices shown (1 of 2)]
[im 1/14]
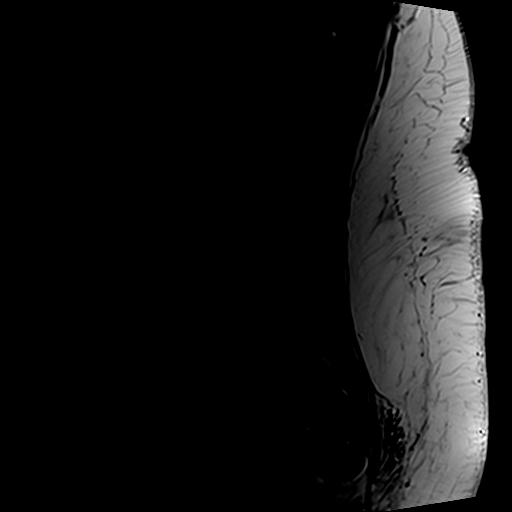
[im 3/14]
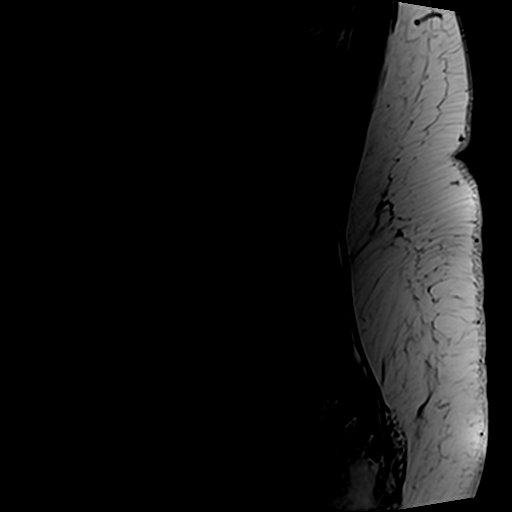
[im 6/14]
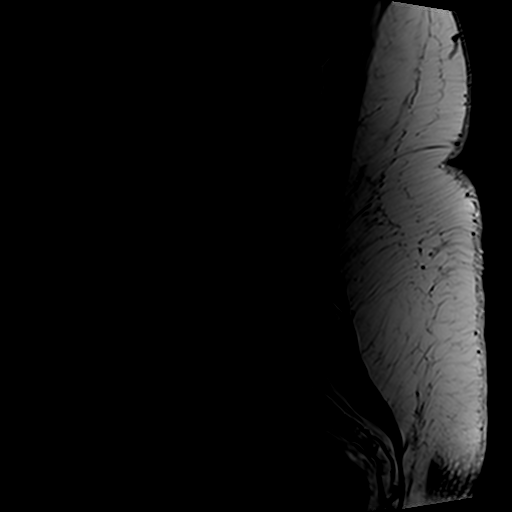
[im 8/14]
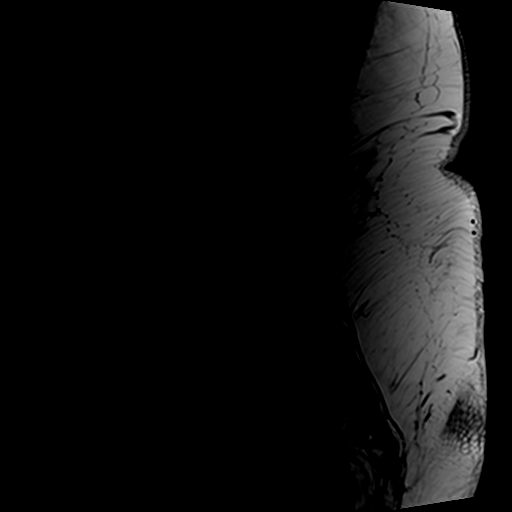
[im 11/14]
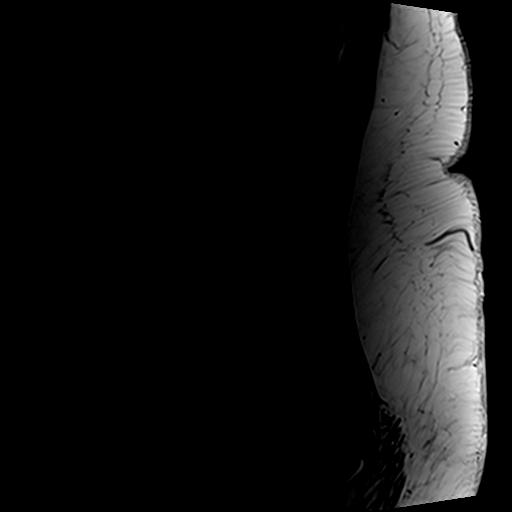
[im 14/14]
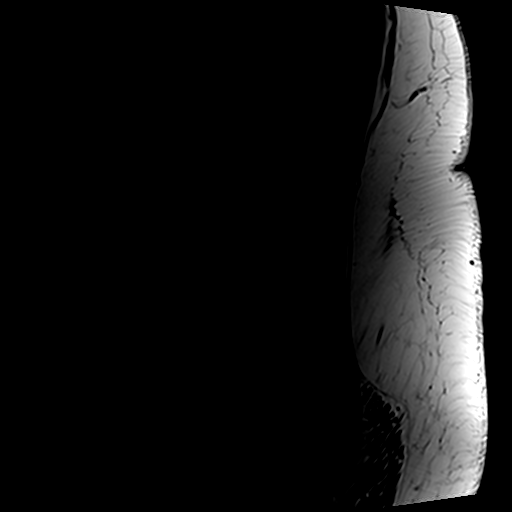

[Series 6: T2 · axial · 4.0mm · 0.66mm/px · z∈[-12,+154]mm · 9 of 33 slices shown (2 of 2)]
[im 1/33]
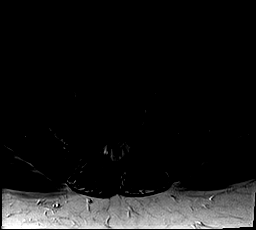
[im 5/33]
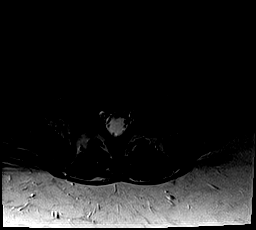
[im 10/33]
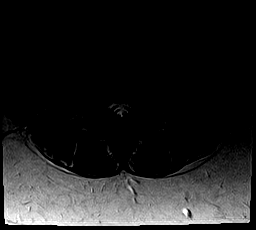
[im 14/33]
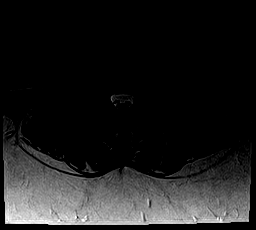
[im 17/33]
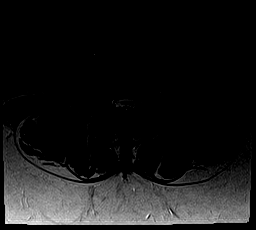
[im 19/33]
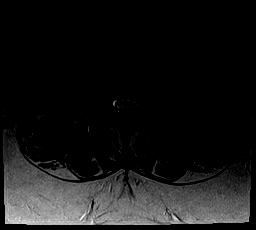
[im 23/33]
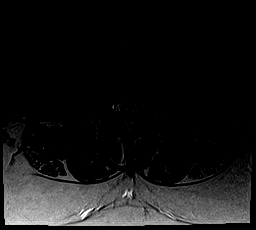
[im 28/33]
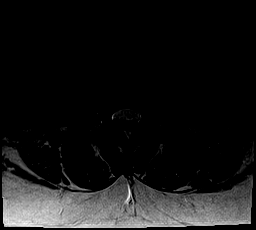
[im 33/33]
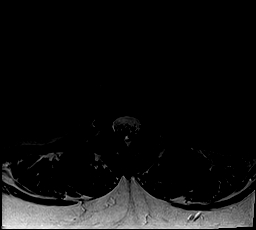

[Series 7: T1 · axial · 4.0mm · 0.33mm/px · z∈[-12,+132]mm · 5 of 33 slices shown (2 of 2)]
[im 1/33]
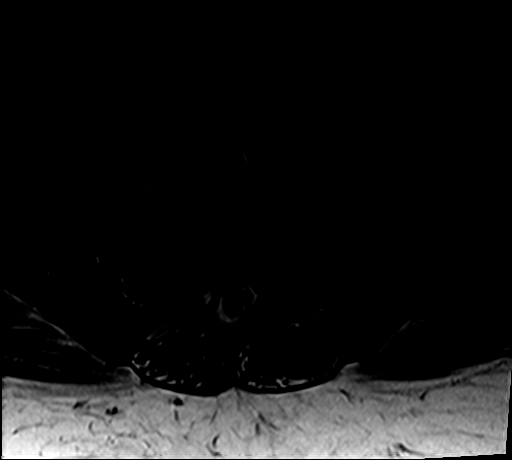
[im 5/33]
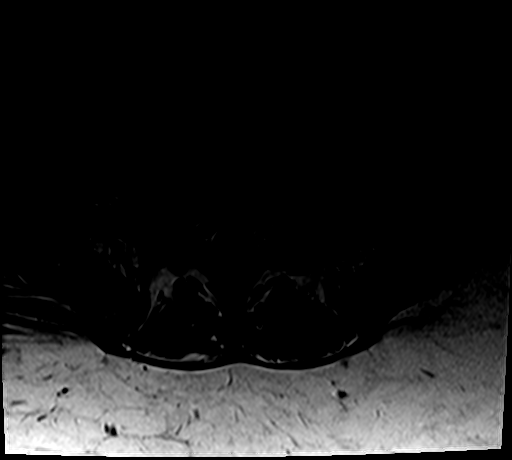
[im 10/33]
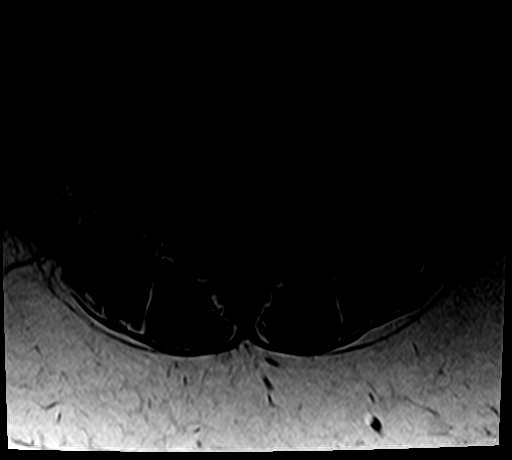
[im 17/33]
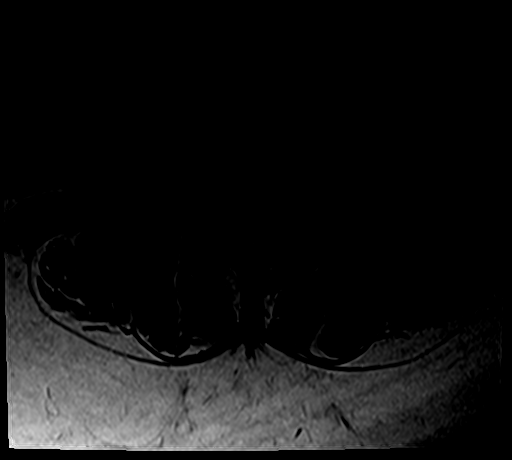
[im 28/33]
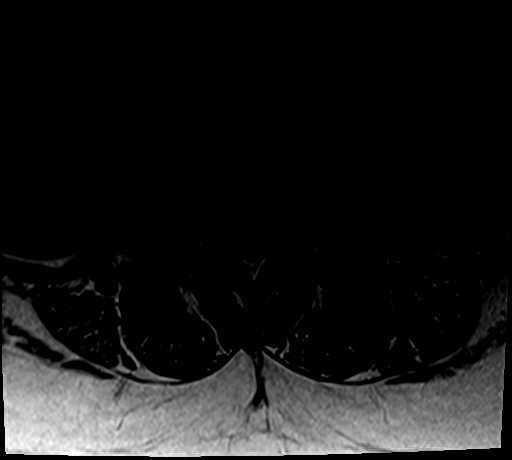

[26 of 48 positions shown; findings below may reference images not displayed]

FINDINGS: Image quality degraded by patient size.

2 mm anterior slip L4-5.  Negative for fracture or mass.  Conus
medullaris is normal and terminates at L1-2.

L1-2:  Mild facet arthropathy.  Normal disc.

L2-3:  Mild disc and facet degeneration without stenosis.

L3-4:  Disc degeneration with disc bulging and spondylosis.
Moderate facet hypertrophy with mild spinal stenosis.

L4-5:  Grade 1 anterior slip.  Severe facet degeneration.  Mild
disc degeneration.  Mild spinal stenosis and mild foraminal
stenosis bilaterally.

L5-S1:  Disc degeneration and spondylosis.  Bilateral facet
hypertrophy.  Moderate foraminal narrowing bilaterally.
IMPRESSION: Mild spinal stenosis at L3-4 with significant facet degeneration.

Grade 1 slip L4-5 with advanced facet degeneration and mild spinal
stenosis.

Foraminal narrowing bilaterally L5-S1 due to facet hypertrophy and
vertebral spurring.

## 2013-09-28 ENCOUNTER — Ambulatory Visit: Payer: Medicare Other | Attending: Internal Medicine | Admitting: Internal Medicine

## 2013-09-28 ENCOUNTER — Encounter: Payer: Self-pay | Admitting: Internal Medicine

## 2013-09-28 VITALS — BP 137/81 | HR 71 | Temp 98.3°F | Resp 16 | Wt 284.0 lb

## 2013-09-28 DIAGNOSIS — E785 Hyperlipidemia, unspecified: Secondary | ICD-10-CM | POA: Insufficient documentation

## 2013-09-28 DIAGNOSIS — K219 Gastro-esophageal reflux disease without esophagitis: Secondary | ICD-10-CM | POA: Insufficient documentation

## 2013-09-28 DIAGNOSIS — I10 Essential (primary) hypertension: Secondary | ICD-10-CM | POA: Diagnosis not present

## 2013-09-28 DIAGNOSIS — J449 Chronic obstructive pulmonary disease, unspecified: Secondary | ICD-10-CM | POA: Diagnosis not present

## 2013-09-28 DIAGNOSIS — M545 Low back pain, unspecified: Secondary | ICD-10-CM | POA: Diagnosis not present

## 2013-09-28 DIAGNOSIS — F329 Major depressive disorder, single episode, unspecified: Secondary | ICD-10-CM | POA: Diagnosis not present

## 2013-09-28 DIAGNOSIS — G4733 Obstructive sleep apnea (adult) (pediatric): Secondary | ICD-10-CM | POA: Diagnosis not present

## 2013-09-28 DIAGNOSIS — F32A Depression, unspecified: Secondary | ICD-10-CM

## 2013-09-28 DIAGNOSIS — M549 Dorsalgia, unspecified: Secondary | ICD-10-CM | POA: Diagnosis not present

## 2013-09-28 DIAGNOSIS — Z720 Tobacco use: Secondary | ICD-10-CM

## 2013-09-28 DIAGNOSIS — F411 Generalized anxiety disorder: Secondary | ICD-10-CM | POA: Insufficient documentation

## 2013-09-28 DIAGNOSIS — J4489 Other specified chronic obstructive pulmonary disease: Secondary | ICD-10-CM | POA: Insufficient documentation

## 2013-09-28 DIAGNOSIS — F17200 Nicotine dependence, unspecified, uncomplicated: Secondary | ICD-10-CM | POA: Diagnosis not present

## 2013-09-28 DIAGNOSIS — F3289 Other specified depressive episodes: Secondary | ICD-10-CM | POA: Insufficient documentation

## 2013-09-28 DIAGNOSIS — F419 Anxiety disorder, unspecified: Secondary | ICD-10-CM

## 2013-09-28 DIAGNOSIS — G8929 Other chronic pain: Secondary | ICD-10-CM | POA: Diagnosis not present

## 2013-09-28 LAB — COMPLETE METABOLIC PANEL WITH GFR
ALT: 12 U/L (ref 0–35)
AST: 19 U/L (ref 0–37)
Albumin: 3.8 g/dL (ref 3.5–5.2)
Alkaline Phosphatase: 75 U/L (ref 39–117)
BUN: 11 mg/dL (ref 6–23)
CO2: 30 meq/L (ref 19–32)
Calcium: 8.8 mg/dL (ref 8.4–10.5)
Chloride: 102 mEq/L (ref 96–112)
Creat: 0.8 mg/dL (ref 0.50–1.10)
GFR, EST NON AFRICAN AMERICAN: 83 mL/min
GLUCOSE: 87 mg/dL (ref 70–99)
Potassium: 4.1 mEq/L (ref 3.5–5.3)
Sodium: 140 mEq/L (ref 135–145)
TOTAL PROTEIN: 6.7 g/dL (ref 6.0–8.3)
Total Bilirubin: 0.4 mg/dL (ref 0.2–1.2)

## 2013-09-28 MED ORDER — HYDROCHLOROTHIAZIDE 50 MG PO TABS: 50.0000 mg | ORAL_TABLET | Freq: Every day | ORAL | Status: DC

## 2013-09-28 MED ORDER — OMEPRAZOLE 40 MG PO CPDR: 40.0000 mg | DELAYED_RELEASE_CAPSULE | Freq: Every day | ORAL | Status: DC

## 2013-09-28 MED ORDER — SERTRALINE HCL 50 MG PO TABS: 50.0000 mg | ORAL_TABLET | Freq: Every day | ORAL | Status: DC

## 2013-09-28 MED ORDER — LORAZEPAM 1 MG PO TABS: ORAL_TABLET | ORAL | Status: DC

## 2013-09-28 MED ORDER — NICOTINE 21 MG/24HR TD PT24: 21.0000 mg | MEDICATED_PATCH | Freq: Every day | TRANSDERMAL | Status: DC

## 2013-09-28 NOTE — Progress Notes (Signed)
MRN: [id number] [name]  Sex: female Age: 58 y.o. DOB: 16-Aug-1955  Allergies: Kiwi extract and Aspirin  Chief Complaint  Patient presents with  . Back Pain    HPI: Patient is 58 y.o. female who has history of hypertension, COPD/asthma, chronic lower back pain anxiety/depression, comes today requesting refill on her medications as per patient she used to be on Celexa for the past 2 weeks he is stopped taking this medication as per patient this medication was causing swelling in her hands, for hypertension she is on hydrochlorothiazide, noticed Lasix listed in her medication list as per patient she is not taking it anymore, history of chronic low back pain following up with the pain management and is taking Percocet and muscle relaxant, patient still smokes cigarettes, advised patient to quit smoking she is going to try nicotine patch, history of GERD her taking Prilosec.  Past Medical History  Diagnosis Date  . Hypertension   . Asthma   . COPD (chronic obstructive pulmonary disease)   . Anxiety   . Cancer   . Colon cancer   . Dyslipidemia   . Migraine headache   . Chronic bronchitis   . Uterine fibroid   . Ovarian cyst   . GERD (gastroesophageal reflux disease)   . Morbid obesity   . Obstructive sleep apnea     mild  . Chronic pain   . Chronic back pain   . Arthritis   . Osteoarthritis   . Depression     Past Surgical History  Procedure Laterality Date  . Knee arthroscopy      bil.  . Carpal tunnel release      rt  . Mouth surgery      teeth extraction  . Cesarean section    . Subtotal colectomy    . Colonoscopy    . Tubal ligation        Medication List       This list is accurate as of: 09/28/13 12:28 PM.  Always use your most recent med list.               albuterol 108 (90 BASE) MCG/ACT inhaler  Commonly known as:  PROVENTIL HFA;VENTOLIN HFA  Inhale 2 puffs into the lungs every 6 (six) hours as needed for shortness of breath. For  shortness of breeath     budesonide-formoterol 160-4.5 MCG/ACT inhaler  Commonly known as:  SYMBICORT  Inhale 2 puffs into the lungs 2 (two) times daily.     hydrochlorothiazide 50 MG tablet  Commonly known as:  HYDRODIURIL  Take 1 tablet (50 mg total) by mouth daily.     ibuprofen 800 MG tablet  Commonly known as:  ADVIL,MOTRIN  Take 1 tablet by mouth 2 (two) times daily as needed.     LORazepam 1 MG tablet  Commonly known as:  ATIVAN  TAKE 1 TABLET BY MOUTH TWICE A DAY AS NEEDED FOR ANXIETY     nicotine 21 mg/24hr patch  Commonly known as:  NICODERM CQ  Place 1 patch (21 mg total) onto the skin daily.     omeprazole 40 MG capsule  Commonly known as:  PRILOSEC  Take 1 capsule (40 mg total) by mouth daily.     oxyCODONE-acetaminophen 5-325 MG per tablet  Commonly known as:  PERCOCET/ROXICET  Take 1-2 tablets by mouth every 6 (six) hours as needed for severe pain.     sertraline 50 MG tablet  Commonly known as:  ZOLOFT  Take 1 tablet (50 mg total) by mouth daily.     tiZANidine 4 MG tablet  Commonly known as:  ZANAFLEX  Take 1 tablet by mouth 3 (three) times daily as needed. spasms     traZODone 50 MG tablet  Commonly known as:  DESYREL  Take 50 mg by mouth at bedtime.        Meds ordered this encounter  Medications  . sertraline (ZOLOFT) 50 MG tablet    Sig: Take 1 tablet (50 mg total) by mouth daily.    Dispense:  30 tablet    Refill:  3  . nicotine (NICODERM CQ) 21 mg/24hr patch    Sig: Place 1 patch (21 mg total) onto the skin daily.    Dispense:  28 patch    Refill:  0  . hydrochlorothiazide (HYDRODIURIL) 50 MG tablet    Sig: Take 1 tablet (50 mg total) by mouth daily.    Dispense:  30 tablet    Refill:  3  . LORazepam (ATIVAN) 1 MG tablet    Sig: TAKE 1 TABLET BY MOUTH TWICE A DAY AS NEEDED FOR ANXIETY    Dispense:  30 tablet    Refill:  0  . omeprazole (PRILOSEC) 40 MG capsule    Sig: Take 1 capsule (40 mg total) by mouth daily.    Dispense:  30  capsule    Refill:  3    Immunization History  Administered Date(s) Administered  . Influenza Split 08/19/2012  . Influenza,inj,Quad PF,36+ Mos 04/20/2013  . Pneumococcal Polysaccharide-23 10/24/2011, 08/19/2012    Family History  Problem Relation Age of Onset  . Uterine cancer Mother   . Stroke Father   . Colon cancer Maternal Uncle   . Diabetes Father   . Ovarian cancer Mother   . Hypertension Father   . Breast cancer Maternal Grandmother   . Diabetes Sister   . Asthma Child   . Asthma Child     History  Substance Use Topics  . Smoking status: Current Some Day Smoker -- 1.00 packs/day for 30 years    Types: Cigarettes  . Smokeless tobacco: Never Used     Comment: tobacco info given 07/21/2011  . Alcohol Use: No    Review of Systems   As noted in HPI  Filed Vitals:   09/28/13 1203  BP: 137/81  Pulse: 71  Temp: 98.3 F (36.8 C)  Resp: 16    Physical Exam  Physical Exam  Constitutional: No distress.  Eyes: EOM are normal. Pupils are equal, round, and reactive to light.  Neck: Neck supple.  Cardiovascular: Normal rate and regular rhythm.   Pulmonary/Chest: Breath sounds normal. No respiratory distress. She has no wheezes. She has no rales.  Musculoskeletal:  Lower lumbar paraspinal tenderness, equal strength both lower extremities     CBC    Component Value Date/Time   WBC 6.8 08/28/2013 2123   RBC 4.42 08/28/2013 2123   HGB 16.0* 08/28/2013 2146   HCT 47.0* 08/28/2013 2146   PLT 193 08/28/2013 2123   MCV 96.4 08/28/2013 2123   LYMPHSABS 2.8 08/28/2013 2123   MONOABS 0.6 08/28/2013 2123   EOSABS 0.1 08/28/2013 2123   BASOSABS 0.0 08/28/2013 2123    CMP     Component Value Date/Time   NA 144 08/28/2013 2146   K 2.7* 08/28/2013 2146   CL 98 08/28/2013 2146   CO2 32 08/28/2013 2123   GLUCOSE 97 08/28/2013 2146   BUN 14 08/28/2013 2146  CREATININE 1.00 08/28/2013 2146   CREATININE 0.91 04/29/2013 1044   CALCIUM 9.6 08/28/2013 2123   PROT 7.3 08/28/2013  2123   ALBUMIN 3.4* 08/28/2013 2123   AST 16 08/28/2013 2123   ALT 11 08/28/2013 2123   ALKPHOS 87 08/28/2013 2123   BILITOT 0.2* 08/28/2013 2123   GFRNONAA 80* 08/28/2013 2123   GFRNONAA 71 04/29/2013 1044   GFRAA >90 08/28/2013 2123   GFRAA 82 04/29/2013 1044    Lab Results  Component Value Date/Time   CHOL 161 10/22/2011  5:53 AM    No components found with this basename: hga1c    Lab Results  Component Value Date/Time   AST 16 08/28/2013  9:23 PM    Assessment and Plan  Hypertension - Plan: The patient has not been taking Lasix which is discontinued, continue with her hydrochlorothiazide (HYDRODIURIL) 50 MG tablet, will repeat blood chemistry COMPLETE METABOLIC PANEL WITH GFR  Back pain Patient is on Percocet and muscle relaxant, advise patient to apply heating pad also following up with pain management  Anxiety - Plan: LORazepam (ATIVAN) 1 MG tablet when necessary for anxiety  Depression - Plan: Celexa discontinued I have started patient on sertraline (ZOLOFT) 50 MG tablet.  Tobacco abuse - Plan: nicotine (NICODERM CQ) 21 mg/24hr patch  GERD (gastroesophageal reflux disease) - Plan: omeprazole (PRILOSEC) 40 MG capsule   Return in about 3 months (around 12/28/2013).  Lorayne Marek, MD

## 2013-09-28 NOTE — Progress Notes (Signed)
Patient complains of having back pain Complains of pain(pressure) near her buttocks similar to sciatica pain Has bilateral knee pain

## 2013-09-28 NOTE — Patient Instructions (Signed)
DASH Diet  The DASH diet stands for "Dietary Approaches to Stop Hypertension." It is a healthy eating plan that has been shown to reduce high blood pressure (hypertension) in as little as 14 days, while also possibly providing other significant health benefits. These other health benefits include reducing the risk of breast cancer after menopause and reducing the risk of type 2 diabetes, heart disease, colon cancer, and stroke. Health benefits also include weight loss and slowing kidney failure in patients with chronic kidney disease.   DIET GUIDELINES  · Limit salt (sodium). Your diet should contain less than 1500 mg of sodium daily.  · Limit refined or processed carbohydrates. Your diet should include mostly whole grains. Desserts and added sugars should be used sparingly.  · Include small amounts of heart-healthy fats. These types of fats include nuts, oils, and tub margarine. Limit saturated and trans fats. These fats have been shown to be harmful in the body.  CHOOSING FOODS   The following food groups are based on a 2000 calorie diet. See your Registered Dietitian for individual calorie needs.  Grains and Grain Products (6 to 8 servings daily)  · Eat More Often: Whole-wheat bread, brown rice, whole-grain or wheat pasta, quinoa, popcorn without added fat or salt (air popped).  · Eat Less Often: White bread, white pasta, white rice, cornbread.  Vegetables (4 to 5 servings daily)  · Eat More Often: Fresh, frozen, and canned vegetables. Vegetables may be raw, steamed, roasted, or grilled with a minimal amount of fat.  · Eat Less Often/Avoid: Creamed or fried vegetables. Vegetables in a cheese sauce.  Fruit (4 to 5 servings daily)  · Eat More Often: All fresh, canned (in natural juice), or frozen fruits. Dried fruits without added sugar. One hundred percent fruit juice (½ cup [237 mL] daily).  · Eat Less Often: Dried fruits with added sugar. Canned fruit in light or heavy syrup.  Lean Meats, Fish, and Poultry (2  servings or less daily. One serving is 3 to 4 oz [85-114 g]).  · Eat More Often: Ninety percent or leaner ground beef, tenderloin, sirloin. Round cuts of beef, chicken breast, turkey breast. All fish. Grill, bake, or broil your meat. Nothing should be fried.  · Eat Less Often/Avoid: Fatty cuts of meat, turkey, or chicken leg, thigh, or wing. Fried cuts of meat or fish.  Dairy (2 to 3 servings)  · Eat More Often: Low-fat or fat-free milk, low-fat plain or light yogurt, reduced-fat or part-skim cheese.  · Eat Less Often/Avoid: Milk (whole, 2%). Whole milk yogurt. Full-fat cheeses.  Nuts, Seeds, and Legumes (4 to 5 servings per week)  · Eat More Often: All without added salt.  · Eat Less Often/Avoid: Salted nuts and seeds, canned beans with added salt.  Fats and Sweets (limited)  · Eat More Often: Vegetable oils, tub margarines without trans fats, sugar-free gelatin. Mayonnaise and salad dressings.  · Eat Less Often/Avoid: Coconut oils, palm oils, butter, stick margarine, cream, half and half, cookies, candy, pie.  FOR MORE INFORMATION  The Dash Diet Eating Plan: www.dashdiet.org  Document Released: 05/15/2011 Document Revised: 08/18/2011 Document Reviewed: 05/15/2011  [employer]® Patient Information ©2014 [employer].
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
## 2013-10-28 ENCOUNTER — Telehealth: Payer: Self-pay

## 2013-10-28 ENCOUNTER — Telehealth: Payer: Self-pay | Admitting: Internal Medicine

## 2013-10-28 ENCOUNTER — Other Ambulatory Visit: Payer: Self-pay

## 2013-10-28 DIAGNOSIS — F419 Anxiety disorder, unspecified: Secondary | ICD-10-CM

## 2013-10-28 MED ORDER — LORAZEPAM 1 MG PO TABS: ORAL_TABLET | ORAL | Status: DC

## 2013-10-28 NOTE — Telephone Encounter (Signed)
Medication was refilled and is at the front desk to be picked up by patient

## 2013-10-28 NOTE — Telephone Encounter (Signed)
Patient came to office requesting refill on her ativan Per Dr Dani Gobble to refill prescription

## 2013-10-28 NOTE — Telephone Encounter (Addendum)
Pt requesting refill for anxiety medication, pt was last seen on 09/28/13.  Please f/u with pt.

## 2013-11-03 DIAGNOSIS — Z79899 Other long term (current) drug therapy: Secondary | ICD-10-CM | POA: Diagnosis not present

## 2013-11-03 DIAGNOSIS — M5137 Other intervertebral disc degeneration, lumbosacral region: Secondary | ICD-10-CM | POA: Diagnosis not present

## 2013-11-03 DIAGNOSIS — G894 Chronic pain syndrome: Secondary | ICD-10-CM | POA: Diagnosis not present

## 2013-11-03 DIAGNOSIS — M5030 Other cervical disc degeneration, unspecified cervical region: Secondary | ICD-10-CM | POA: Diagnosis not present

## 2013-12-01 DIAGNOSIS — M5137 Other intervertebral disc degeneration, lumbosacral region: Secondary | ICD-10-CM | POA: Diagnosis not present

## 2013-12-01 DIAGNOSIS — M79609 Pain in unspecified limb: Secondary | ICD-10-CM | POA: Diagnosis not present

## 2013-12-01 DIAGNOSIS — G894 Chronic pain syndrome: Secondary | ICD-10-CM | POA: Diagnosis not present

## 2013-12-01 DIAGNOSIS — Z79899 Other long term (current) drug therapy: Secondary | ICD-10-CM | POA: Diagnosis not present

## 2013-12-05 ENCOUNTER — Other Ambulatory Visit: Payer: Self-pay | Admitting: Internal Medicine

## 2013-12-08 ENCOUNTER — Telehealth: Payer: Self-pay | Admitting: Internal Medicine

## 2013-12-08 DIAGNOSIS — M5137 Other intervertebral disc degeneration, lumbosacral region: Secondary | ICD-10-CM | POA: Diagnosis not present

## 2013-12-08 DIAGNOSIS — Z79899 Other long term (current) drug therapy: Secondary | ICD-10-CM | POA: Diagnosis not present

## 2013-12-08 DIAGNOSIS — G894 Chronic pain syndrome: Secondary | ICD-10-CM | POA: Diagnosis not present

## 2013-12-08 NOTE — Telephone Encounter (Signed)
Pt has come in today to request a refill on medication LORazepam (ATIVAN) 1 MG tablet;  Please f/u with pt by phone or in the lobby;

## 2013-12-13 ENCOUNTER — Telehealth: Payer: Self-pay | Admitting: *Deleted

## 2013-12-13 DIAGNOSIS — F419 Anxiety disorder, unspecified: Secondary | ICD-10-CM

## 2013-12-13 MED ORDER — LORAZEPAM 1 MG PO TABS: ORAL_TABLET | ORAL | Status: DC

## 2013-12-13 NOTE — Telephone Encounter (Signed)
I left message stating that the Dr. did approve a refill and that she could pick up the medication anytime.

## 2013-12-25 IMAGING — CR DG CHEST 2V
2 series · 2 of 2 positions shown · non-contrast
Comparison: 10/21/11

CLINICAL DATA: Shortness of breath

CHEST - 2 VIEW

[w chest pa]
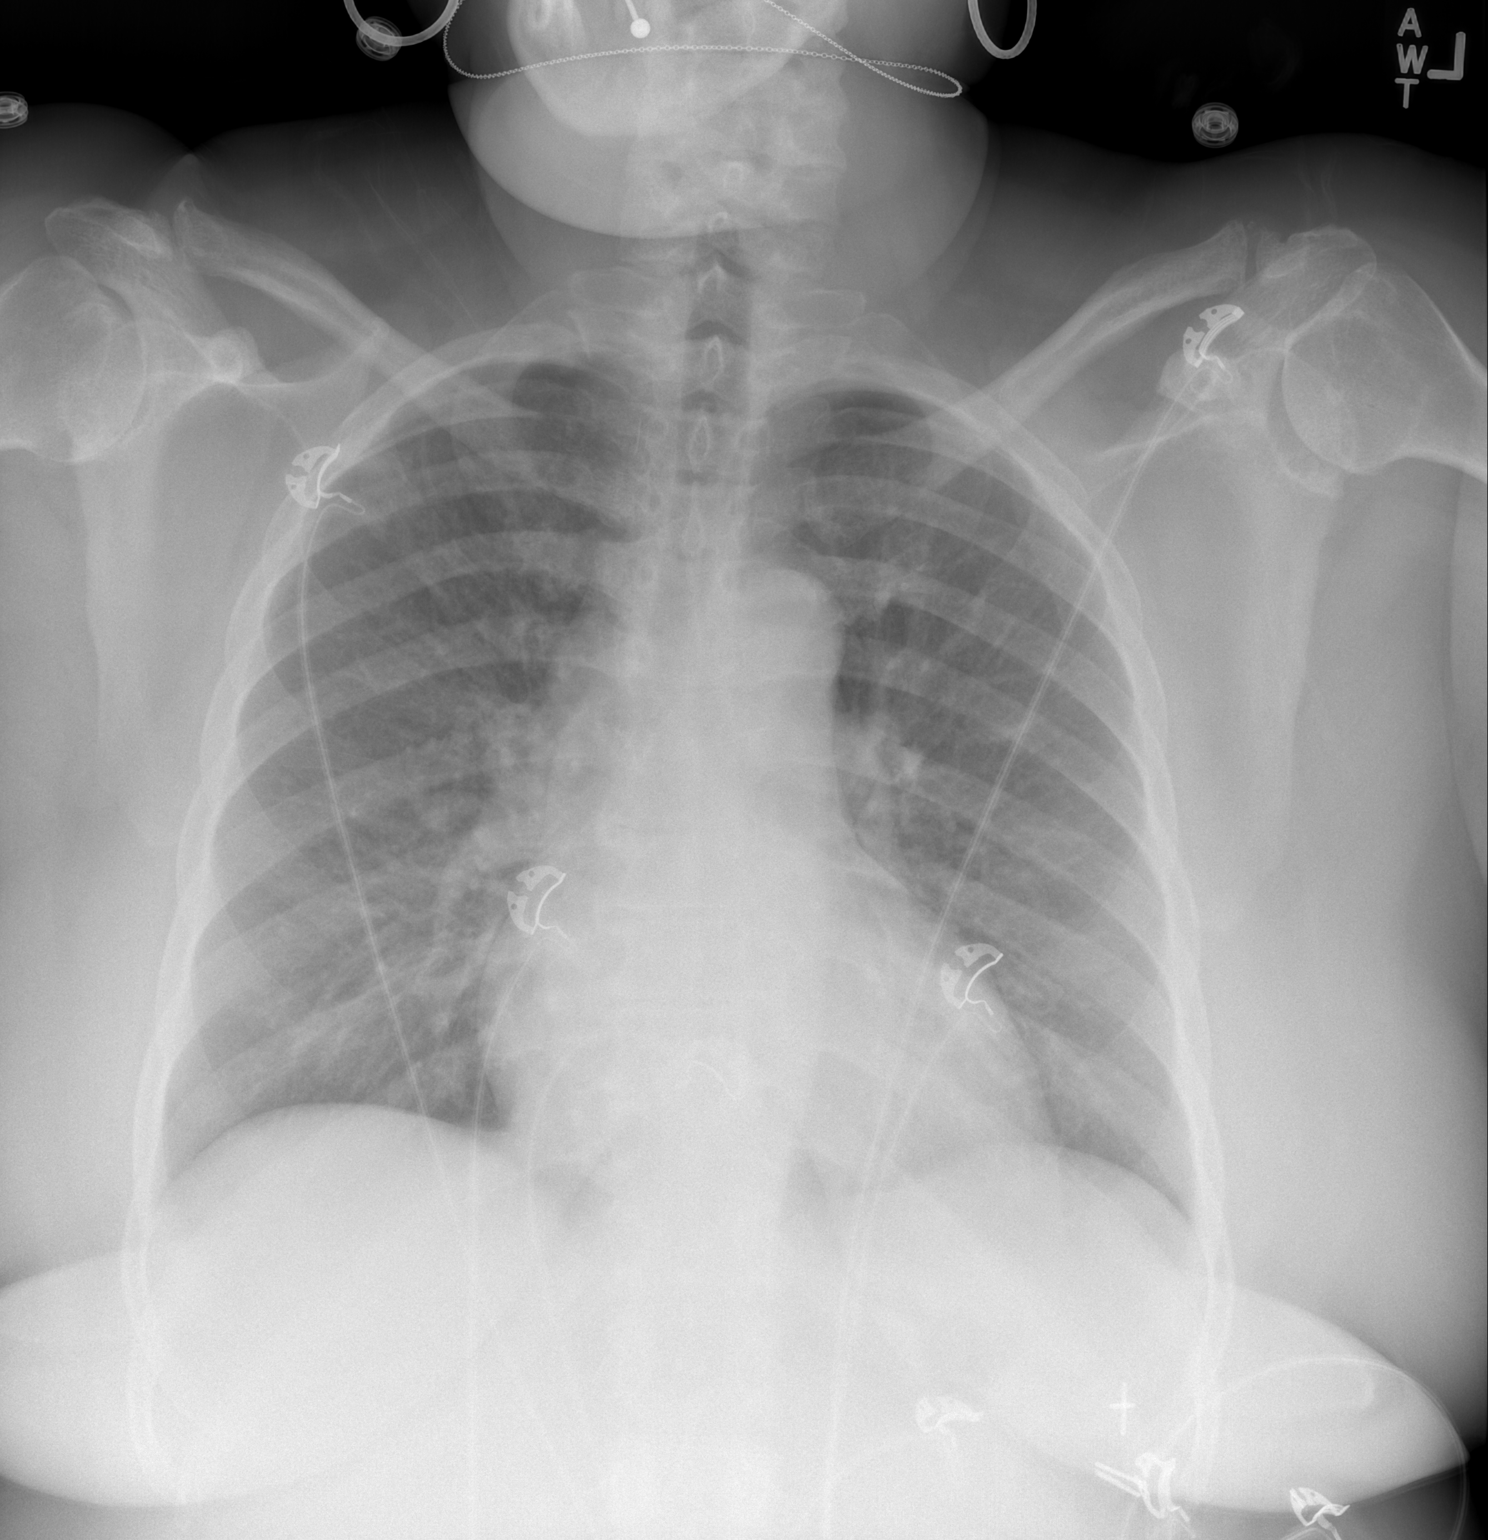

[w chest lat]
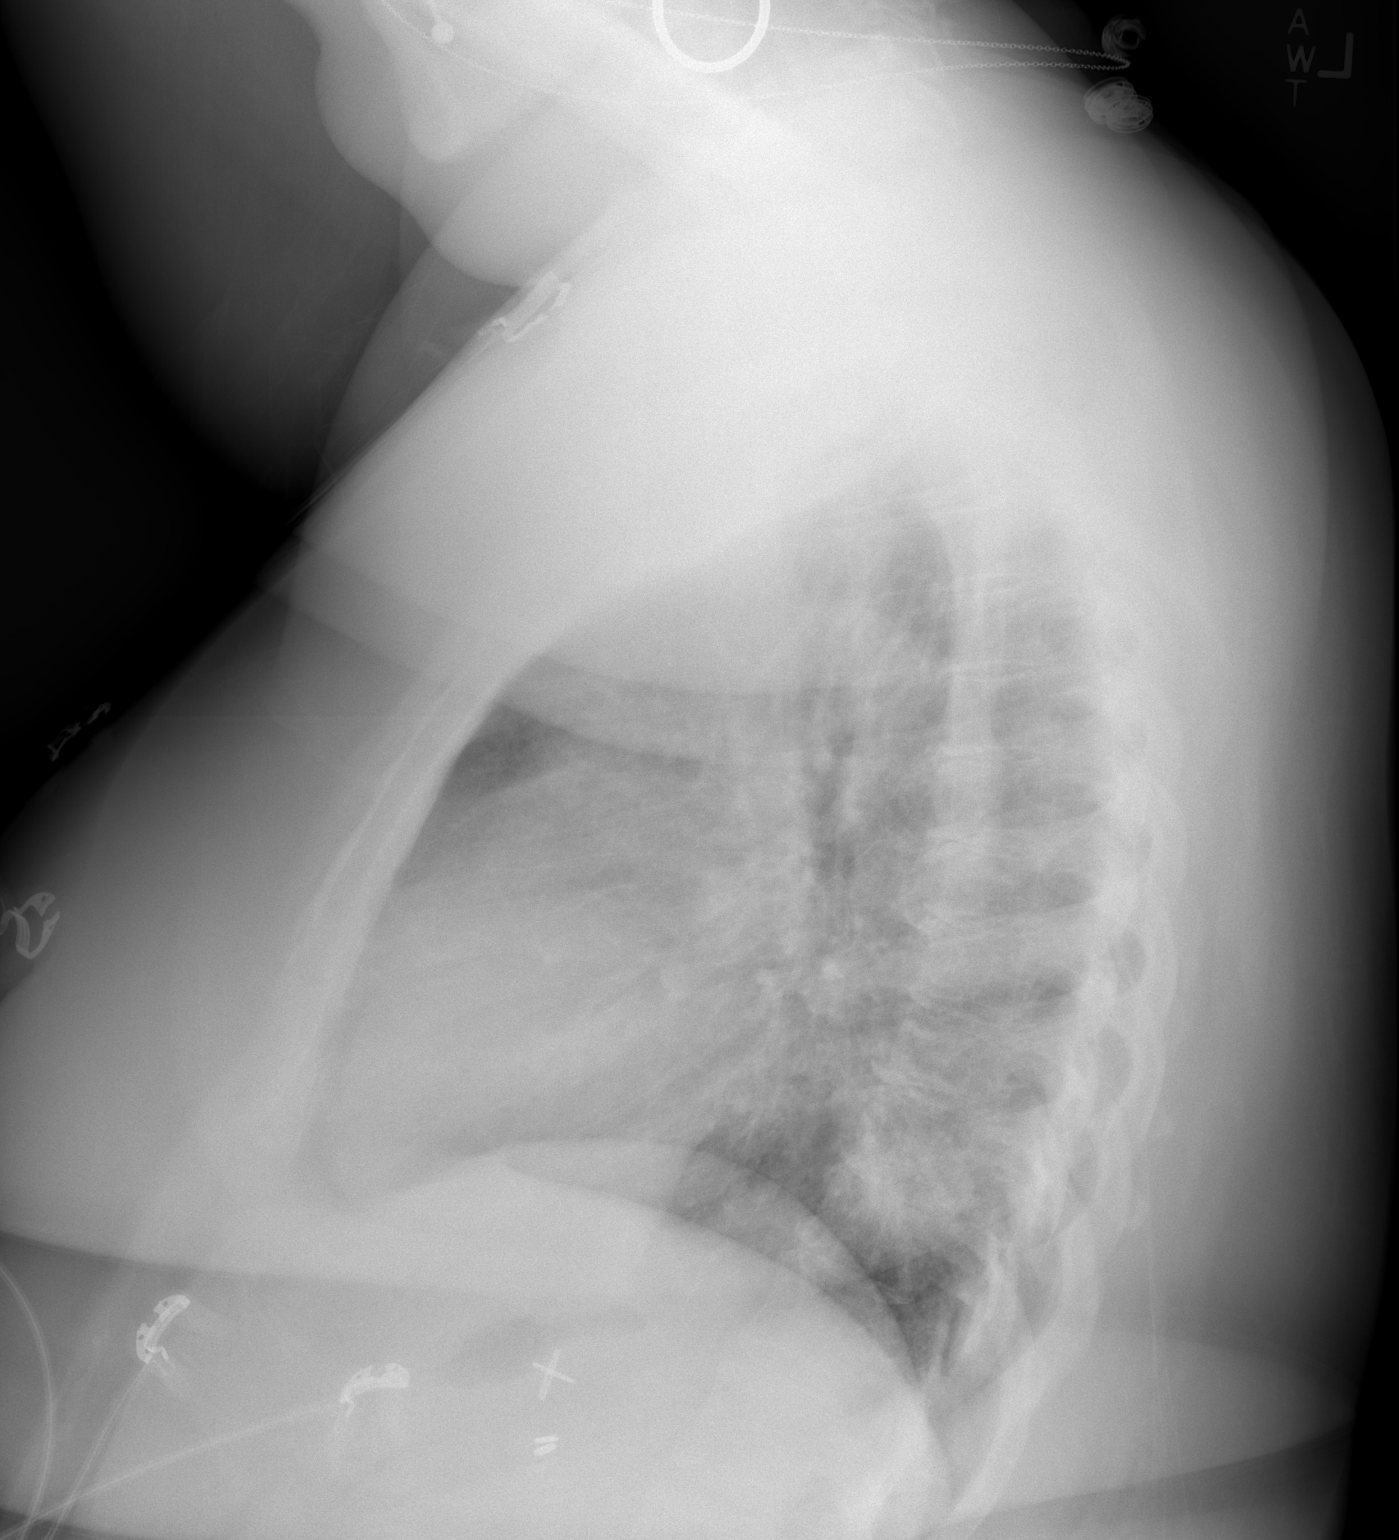

[2 of 2 positions shown; findings below may reference images not displayed]

FINDINGS: Cardiomediastinal silhouette is stable.  No acute
infiltrate or pleural effusion.  No pulmonary edema.  Slight
worsening central bronchitic changes.  Mild degenerative changes
thoracic spine.
IMPRESSION: No acute infiltrate or pulmonary edema.  Slight worsening central
bronchitic changes. Mild degenerative changes thoracic spine.

## 2014-01-04 ENCOUNTER — Telehealth: Payer: Self-pay

## 2014-01-04 NOTE — Telephone Encounter (Signed)
Patient is requesting refill on her ativan Can we refill this?

## 2014-01-10 ENCOUNTER — Emergency Department (HOSPITAL_COMMUNITY)
Admission: EM | Admit: 2014-01-10 | Discharge: 2014-01-11 | Disposition: A | Discharge status: Home / Self Care | Payer: Medicare Other | Attending: Emergency Medicine | Admitting: Emergency Medicine

## 2014-01-10 ENCOUNTER — Encounter (HOSPITAL_COMMUNITY): Payer: Self-pay | Admitting: Emergency Medicine

## 2014-01-10 ENCOUNTER — Emergency Department (HOSPITAL_COMMUNITY): Payer: Medicare Other

## 2014-01-10 DIAGNOSIS — Z85038 Personal history of other malignant neoplasm of large intestine: Secondary | ICD-10-CM | POA: Insufficient documentation

## 2014-01-10 DIAGNOSIS — K219 Gastro-esophageal reflux disease without esophagitis: Secondary | ICD-10-CM | POA: Insufficient documentation

## 2014-01-10 DIAGNOSIS — IMO0002 Reserved for concepts with insufficient information to code with codable children: Secondary | ICD-10-CM | POA: Diagnosis not present

## 2014-01-10 DIAGNOSIS — F3289 Other specified depressive episodes: Secondary | ICD-10-CM | POA: Insufficient documentation

## 2014-01-10 DIAGNOSIS — R079 Chest pain, unspecified: Secondary | ICD-10-CM | POA: Diagnosis not present

## 2014-01-10 DIAGNOSIS — Z720 Tobacco use: Secondary | ICD-10-CM

## 2014-01-10 DIAGNOSIS — R0682 Tachypnea, not elsewhere classified: Secondary | ICD-10-CM | POA: Diagnosis not present

## 2014-01-10 DIAGNOSIS — Z8742 Personal history of other diseases of the female genital tract: Secondary | ICD-10-CM | POA: Insufficient documentation

## 2014-01-10 DIAGNOSIS — F411 Generalized anxiety disorder: Secondary | ICD-10-CM | POA: Diagnosis not present

## 2014-01-10 DIAGNOSIS — M5430 Sciatica, unspecified side: Secondary | ICD-10-CM | POA: Insufficient documentation

## 2014-01-10 DIAGNOSIS — R269 Unspecified abnormalities of gait and mobility: Secondary | ICD-10-CM | POA: Insufficient documentation

## 2014-01-10 DIAGNOSIS — F17200 Nicotine dependence, unspecified, uncomplicated: Secondary | ICD-10-CM | POA: Insufficient documentation

## 2014-01-10 DIAGNOSIS — J441 Chronic obstructive pulmonary disease with (acute) exacerbation: Secondary | ICD-10-CM | POA: Diagnosis not present

## 2014-01-10 DIAGNOSIS — Z79899 Other long term (current) drug therapy: Secondary | ICD-10-CM | POA: Diagnosis not present

## 2014-01-10 DIAGNOSIS — R0602 Shortness of breath: Secondary | ICD-10-CM | POA: Insufficient documentation

## 2014-01-10 DIAGNOSIS — F329 Major depressive disorder, single episode, unspecified: Secondary | ICD-10-CM | POA: Diagnosis not present

## 2014-01-10 DIAGNOSIS — M5432 Sciatica, left side: Secondary | ICD-10-CM

## 2014-01-10 DIAGNOSIS — Z8739 Personal history of other diseases of the musculoskeletal system and connective tissue: Secondary | ICD-10-CM | POA: Diagnosis not present

## 2014-01-10 DIAGNOSIS — G8929 Other chronic pain: Secondary | ICD-10-CM | POA: Diagnosis not present

## 2014-01-10 DIAGNOSIS — Z8669 Personal history of other diseases of the nervous system and sense organs: Secondary | ICD-10-CM | POA: Insufficient documentation

## 2014-01-10 DIAGNOSIS — I493 Ventricular premature depolarization: Secondary | ICD-10-CM

## 2014-01-10 DIAGNOSIS — G894 Chronic pain syndrome: Secondary | ICD-10-CM

## 2014-01-10 DIAGNOSIS — R0789 Other chest pain: Secondary | ICD-10-CM | POA: Diagnosis not present

## 2014-01-10 DIAGNOSIS — I4949 Other premature depolarization: Secondary | ICD-10-CM | POA: Diagnosis not present

## 2014-01-10 DIAGNOSIS — I10 Essential (primary) hypertension: Secondary | ICD-10-CM | POA: Diagnosis not present

## 2014-01-10 LAB — PRO B NATRIURETIC PEPTIDE: Pro B Natriuretic peptide (BNP): 66.7 pg/mL (ref 0–125)

## 2014-01-10 LAB — I-STAT TROPONIN, ED: Troponin i, poc: 0 ng/mL (ref 0.00–0.08)

## 2014-01-10 LAB — CBC
HCT: 44.9 % (ref 36.0–46.0)
Hemoglobin: 14.6 g/dL (ref 12.0–15.0)
MCH: 32.6 pg (ref 26.0–34.0)
MCHC: 32.5 g/dL (ref 30.0–36.0)
MCV: 100.2 fL — ABNORMAL HIGH (ref 78.0–100.0)
Platelets: 167 10*3/uL (ref 150–400)
RBC: 4.48 MIL/uL (ref 3.87–5.11)
RDW: 14.3 % (ref 11.5–15.5)
WBC: 7.2 10*3/uL (ref 4.0–10.5)

## 2014-01-10 LAB — COMPREHENSIVE METABOLIC PANEL
ALK PHOS: 82 U/L (ref 39–117)
ALT: 13 U/L (ref 0–35)
AST: 16 U/L (ref 0–37)
Albumin: 3.1 g/dL — ABNORMAL LOW (ref 3.5–5.2)
Anion gap: 11 (ref 5–15)
BUN: 13 mg/dL (ref 6–23)
CO2: 30 mEq/L (ref 19–32)
Calcium: 9.3 mg/dL (ref 8.4–10.5)
Chloride: 101 mEq/L (ref 96–112)
Creatinine, Ser: 0.96 mg/dL (ref 0.50–1.10)
GFR calc non Af Amer: 65 mL/min — ABNORMAL LOW (ref >90)
GFR, EST AFRICAN AMERICAN: 75 mL/min — AB (ref >90)
Glucose, Bld: 102 mg/dL — ABNORMAL HIGH (ref 70–99)
POTASSIUM: 3.1 meq/L — AB (ref 3.7–5.3)
SODIUM: 142 meq/L (ref 137–147)
TOTAL PROTEIN: 7.3 g/dL (ref 6.0–8.3)
Total Bilirubin: <0.2 mg/dL — ABNORMAL LOW (ref 0.3–1.2)

## 2014-01-10 MED ORDER — HYDROMORPHONE HCL PF 1 MG/ML IJ SOLN
1.0000 mg | Freq: Once | INTRAMUSCULAR | Status: AC
  Administered 2014-01-10: 1 mg via INTRAVENOUS
  Filled 2014-01-10: qty 1

## 2014-01-10 MED ORDER — METHYLPREDNISOLONE SODIUM SUCC 125 MG IJ SOLR
125.0000 mg | Freq: Once | INTRAMUSCULAR | Status: AC
  Administered 2014-01-10: 125 mg via INTRAVENOUS
  Filled 2014-01-10: qty 2

## 2014-01-10 MED ORDER — ALBUTEROL (5 MG/ML) CONTINUOUS INHALATION SOLN
10.0000 mg/h | INHALATION_SOLUTION | RESPIRATORY_TRACT | Status: AC
  Administered 2014-01-10: 10 mg/h via RESPIRATORY_TRACT
  Filled 2014-01-10: qty 20

## 2014-01-10 MED ORDER — IPRATROPIUM BROMIDE 0.02 % IN SOLN
1.0000 mg | Freq: Once | RESPIRATORY_TRACT | Status: AC
  Administered 2014-01-10: 1 mg via RESPIRATORY_TRACT
  Filled 2014-01-10: qty 5

## 2014-01-10 MED ORDER — PREDNISONE 20 MG PO TABS: 40.0000 mg | ORAL_TABLET | Freq: Every day | ORAL | Status: DC

## 2014-01-10 MED ORDER — ALBUTEROL SULFATE (2.5 MG/3ML) 0.083% IN NEBU
5.0000 mg | INHALATION_SOLUTION | Freq: Once | RESPIRATORY_TRACT | Status: AC
  Administered 2014-01-10: 5 mg via RESPIRATORY_TRACT
  Filled 2014-01-10: qty 6

## 2014-01-10 MED ORDER — IPRATROPIUM BROMIDE 0.02 % IN SOLN
0.5000 mg | Freq: Once | RESPIRATORY_TRACT | Status: AC
  Administered 2014-01-10: 0.5 mg via RESPIRATORY_TRACT
  Filled 2014-01-10: qty 2.5

## 2014-01-10 MED ORDER — DIAZEPAM 5 MG/ML IJ SOLN
5.0000 mg | Freq: Once | INTRAMUSCULAR | Status: AC
  Administered 2014-01-10: 5 mg via INTRAVENOUS
  Filled 2014-01-10: qty 2

## 2014-01-10 MED ORDER — OXYCODONE-ACETAMINOPHEN 5-325 MG PO TABS: 1.0000 | ORAL_TABLET | ORAL | Status: DC | PRN

## 2014-01-10 MED ORDER — ALBUTEROL SULFATE HFA 108 (90 BASE) MCG/ACT IN AERS
2.0000 | INHALATION_SPRAY | RESPIRATORY_TRACT | Status: DC | PRN
  Administered 2014-01-11: 2 via RESPIRATORY_TRACT
  Filled 2014-01-10: qty 6.7

## 2014-01-10 NOTE — Discharge Instructions (Signed)
1. Medications: prednisone, percocet, usual home medications 2. Treatment: rest, drink plenty of fluids,  3. Follow Up: Please followup with your primary doctor in 2 days for discussion of your diagnoses and further evaluation after today's visit; if you do not have a primary care doctor use the resource guide provided to find one;    Sciatica Sciatica is pain, weakness, numbness, or tingling along the path of the sciatic nerve. The nerve starts in the lower back and runs down the back of each leg. The nerve controls the muscles in the lower leg and in the back of the knee, while also providing sensation to the back of the thigh, lower leg, and the sole of your foot. Sciatica is a symptom of another medical condition. For instance, nerve damage or certain conditions, such as a herniated disk or bone spur on the spine, pinch or put pressure on the sciatic nerve. This causes the pain, weakness, or other sensations normally associated with sciatica. Generally, sciatica only affects one side of the body. CAUSES   Herniated or slipped disc.  Degenerative disk disease.  A pain disorder involving the narrow muscle in the buttocks (piriformis syndrome).  Pelvic injury or fracture.  Pregnancy.  Tumor (rare). SYMPTOMS  Symptoms can vary from mild to very severe. The symptoms usually travel from the low back to the buttocks and down the back of the leg. Symptoms can include:  Mild tingling or dull aches in the lower back, leg, or hip.  Numbness in the back of the calf or sole of the foot.  Burning sensations in the lower back, leg, or hip.  Sharp pains in the lower back, leg, or hip.  Leg weakness.  Severe back pain inhibiting movement. These symptoms may get worse with coughing, sneezing, laughing, or prolonged sitting or standing. Also, being overweight may worsen symptoms. DIAGNOSIS  Your caregiver will perform a physical exam to look for common symptoms of sciatica. He or she may ask you  to do certain movements or activities that would trigger sciatic nerve pain. Other tests may be performed to find the cause of the sciatica. These may include:  Blood tests.  X-rays.  Imaging tests, such as an MRI or CT scan. TREATMENT  Treatment is directed at the cause of the sciatic pain. Sometimes, treatment is not necessary and the pain and discomfort goes away on its own. If treatment is needed, your caregiver may suggest:  Over-the-counter medicines to relieve pain.  Prescription medicines, such as anti-inflammatory medicine, muscle relaxants, or narcotics.  Applying heat or ice to the painful area.  Steroid injections to lessen pain, irritation, and inflammation around the nerve.  Reducing activity during periods of pain.  Exercising and stretching to strengthen your abdomen and improve flexibility of your spine. Your caregiver may suggest losing weight if the extra weight makes the back pain worse.  Physical therapy.  Surgery to eliminate what is pressing or pinching the nerve, such as a bone spur or part of a herniated disk. HOME CARE INSTRUCTIONS   Only take over-the-counter or prescription medicines for pain or discomfort as directed by your caregiver.  Apply ice to the affected area for 20 minutes, 3-4 times a day for the first 48-72 hours. Then try heat in the same way.  Exercise, stretch, or perform your usual activities if these do not aggravate your pain.  Attend physical therapy sessions as directed by your caregiver.  Keep all follow-up appointments as directed by your caregiver.  Do not  wear high heels or shoes that do not provide proper support.  Check your mattress to see if it is too soft. A firm mattress may lessen your pain and discomfort. SEEK IMMEDIATE MEDICAL CARE IF:   You lose control of your bowel or bladder (incontinence).  You have increasing weakness in the lower back, pelvis, buttocks, or legs.  You have redness or swelling of your  back.  You have a burning sensation when you urinate.  You have pain that gets worse when you lie down or awakens you at night.  Your pain is worse than you have experienced in the past.  Your pain is lasting longer than 4 weeks.  You are suddenly losing weight without reason. MAKE SURE YOU:  Understand these instructions.  Will watch your condition.  Will get help right away if you are not doing well or get worse. Document Released: 05/20/2001 Document Revised: 11/25/2011 Document Reviewed: 10/05/2011 [facility] Patient Information 2015 [place], [place]. This information is not intended to replace advice given to you by your health care provider. Make sure you discuss any questions you have with your health care provider.

## 2014-01-10 NOTE — ED Notes (Signed)
Pt c/o SOB x 3 days, arm pain, chronic back pain states she feels like a bone in here spine is pressing on a nerve.. Pt states "I am overly tired"

## 2014-01-10 NOTE — ED Provider Notes (Signed)
CSN: [id number]     Arrival date & time 01/10/14  1634 History   First MD Initiated Contact with Patient 01/10/14 1744     Chief Complaint  Patient presents with  . Shortness of Breath  . Arm Pain  . Back Pain     (Consider location/radiation/quality/duration/timing/severity/associated sxs/prior Treatment) The history is provided by the patient and medical records. No language interpreter was used.    Brooke [name] is a 58 y.o. female  with a hx of HTN, asthma, COPD, anxiety, GERD, chronic back pain presents to the Emergency Department complaining of gradual, persistent, progressively worsening low back pain worsening for the last year.  She reports that there have been several episodes of near falls due to the pain but without leg weakness.  Pt reports she cannot sleep with the pain.  She reports associated radiation down into the left buttock. Pt reports it is worse with walking.  She reports taking percocet which has not been helping lately.  Patient continues to smoke one pack per day.  Pt also reports SOB for the last x3 days.  She reports this is more of a tired feeling.  This is worse with exertion and does not get better with rest.  Pt reports taking her albuterol without relief.  Pt reports this has happened before and is associated with wheezing.  She reports her last flare is unknown.  Pt also reports bilateral upper arm pain, sharp and nonradiating in nature.  It is not made better or worse than anything and is not associated with her SOB or CP.    Past Medical History  Diagnosis Date  . Hypertension   . Asthma   . COPD (chronic obstructive pulmonary disease)   . Anxiety   . Cancer   . Colon cancer   . Dyslipidemia   . Migraine headache   . Chronic bronchitis   . Uterine fibroid   . Ovarian cyst   . GERD (gastroesophageal reflux disease)   . Morbid obesity   . Obstructive sleep apnea     mild  . Chronic pain   . Chronic back pain   . Arthritis   . Osteoarthritis    . Depression    Past Surgical History  Procedure Laterality Date  . Knee arthroscopy      bil.  . Carpal tunnel release      rt  . Mouth surgery      teeth extraction  . Cesarean section    . Subtotal colectomy    . Colonoscopy    . Tubal ligation     Family History  Problem Relation Age of Onset  . Uterine cancer Mother   . Stroke Father   . Colon cancer Maternal Uncle   . Diabetes Father   . Ovarian cancer Mother   . Hypertension Father   . Breast cancer Maternal Grandmother   . Diabetes Sister   . Asthma Child   . Asthma Child    History  Substance Use Topics  . Smoking status: Current Some Day Smoker -- 1.00 packs/day for 30 years    Types: Cigarettes  . Smokeless tobacco: Never Used     Comment: tobacco info given 07/21/2011  . Alcohol Use: No   OB History   Grav Para Term Preterm Abortions TAB SAB Ect Mult Living   2 2 2       2      Review of Systems  Constitutional: Negative for fever, diaphoresis, appetite change, fatigue and  unexpected weight change.  HENT: Negative for mouth sores.   Eyes: Negative for visual disturbance.  Respiratory: Positive for chest tightness, shortness of breath and wheezing. Negative for cough.   Cardiovascular: Negative for chest pain.  Gastrointestinal: Negative for nausea, vomiting, abdominal pain, diarrhea and constipation.  Endocrine: Negative for polydipsia, polyphagia and polyuria.  Genitourinary: Negative for dysuria, urgency, frequency and hematuria.  Musculoskeletal: Positive for back pain and gait problem ( 2/2 pain). Negative for joint swelling, neck pain and neck stiffness.  Skin: Negative for rash.  Allergic/Immunologic: Negative for immunocompromised state.  Neurological: Negative for syncope, weakness, light-headedness, numbness and headaches.  Hematological: Does not bruise/bleed easily.  Psychiatric/Behavioral: Negative for sleep disturbance. The patient is not nervous/anxious.   All other systems reviewed  and are negative.     Allergies  Kiwi extract and Aspirin  Home Medications   Prior to Admission medications   Medication Sig Start Date End Date Taking? Authorizing Provider  albuterol (PROVENTIL HFA;VENTOLIN HFA) 108 (90 BASE) MCG/ACT inhaler Inhale 2 puffs into the lungs every 6 (six) hours as needed for shortness of breath. For shortness of breeath 04/23/13  Yes Orson Eva, MD  hydrochlorothiazide (HYDRODIURIL) 50 MG tablet Take 1 tablet (50 mg total) by mouth daily. 09/28/13  Yes Lorayne Marek, MD  ibuprofen (ADVIL,MOTRIN) 800 MG tablet Take 1 tablet by mouth 2 (two) times daily as needed. 08/04/13  Yes Historical Provider, MD  LORazepam (ATIVAN) 1 MG tablet Take 1 mg by mouth every 6 (six) hours as needed for anxiety (anxiety).   Yes Historical Provider, MD  omeprazole (PRILOSEC) 40 MG capsule Take 1 capsule (40 mg total) by mouth daily. 09/28/13  Yes Lorayne Marek, MD  oxyCODONE-acetaminophen (PERCOCET/ROXICET) 5-325 MG per tablet Take 1-2 tablets by mouth every 6 (six) hours as needed for severe pain. 06/05/13  Yes Carlisle Cater, PA-C  sertraline (ZOLOFT) 50 MG tablet Take 1 tablet (50 mg total) by mouth daily. 09/28/13  Yes Lorayne Marek, MD  traZODone (DESYREL) 50 MG tablet Take 50 mg by mouth at bedtime.  06/12/11  Yes Historical Provider, MD  oxyCODONE-acetaminophen (PERCOCET) 5-325 MG per tablet Take 1-2 tablets by mouth every 4 (four) hours as needed. 01/10/14   Loura Pitt, PA-C  predniSONE (DELTASONE) 20 MG tablet Take 2 tablets (40 mg total) by mouth daily. 01/10/14   Dhriti Fales, PA-C   BP 128/106  Pulse 99  Temp(Src) 97.5 F (36.4 C) (Oral)  Resp 20  SpO2 95%  LMP 05/24/2003 Physical Exam  Nursing note and vitals reviewed. Constitutional: She is oriented to person, place, and time. She appears well-developed and well-nourished. No distress.  Awake, alert, nontoxic appearance  HENT:  Head: Normocephalic and atraumatic.  Right Ear: Tympanic membrane, external  ear and ear canal normal.  Left Ear: Tympanic membrane, external ear and ear canal normal.  Nose: Nose normal. No epistaxis. Right sinus exhibits no maxillary sinus tenderness and no frontal sinus tenderness. Left sinus exhibits no maxillary sinus tenderness and no frontal sinus tenderness.  Mouth/Throat: Uvula is midline, oropharynx is clear and moist and mucous membranes are normal. Mucous membranes are not pale and not cyanotic. No oropharyngeal exudate, posterior oropharyngeal edema, posterior oropharyngeal erythema or tonsillar abscesses.  Eyes: Conjunctivae are normal. Pupils are equal, round, and reactive to light. No scleral icterus.  Neck: Normal range of motion and full passive range of motion without pain. Neck supple.  Full ROM without pain  Cardiovascular: Normal rate, regular rhythm and intact distal pulses.  Pulmonary/Chest: Accessory muscle usage (mild) present. No stridor. Tachypnea noted. No respiratory distress. She has decreased breath sounds. She has wheezes. She has no rhonchi. She has no rales.  Diminished breath sounds and wheezes throughout No focal rhonchi or rails  Abdominal: Soft. Bowel sounds are normal. She exhibits no distension and no mass. There is no tenderness. There is no rebound and no guarding.  Musculoskeletal: Normal range of motion. She exhibits no edema.  Full range of motion of the T-spine and L-spine No tenderness to palpation of the spinous processes of the T-spine or L-spine Mild tenderness to palpation of the left paraspinous muscles of the L-spine Pain to palpation of the left buttock  Lymphadenopathy:    She has no cervical adenopathy.  Neurological: She is alert and oriented to person, place, and time. She has normal reflexes.  Speech is clear and goal oriented, follows commands Normal 5/5 strength in upper and lower extremities bilaterally including dorsiflexion and plantar flexion, strong and equal grip strength Sensation normal to light and  sharp touch Moves extremities without ataxia, coordination intact Gait testing initially deferred due to pain  Skin: Skin is warm and dry. No rash noted. She is not diaphoretic. No erythema.  Psychiatric: She has a normal mood and affect. Her behavior is normal.    ED Course  Procedures (including critical care time) Labs Review Labs Reviewed  CBC - Abnormal; Notable for the following:    MCV 100.2 (*)    All other components within normal limits  COMPREHENSIVE METABOLIC PANEL - Abnormal; Notable for the following:    Potassium 3.1 (*)    Glucose, Bld 102 (*)    Albumin 3.1 (*)    Total Bilirubin <0.2 (*)    GFR calc non Af Amer 65 (*)    GFR calc Af Amer 75 (*)    All other components within normal limits  PRO B NATRIURETIC PEPTIDE  I-STAT TROPOININ, ED    Imaging Review Dg Chest 2 View  01/10/2014   CLINICAL DATA:  Chest pain  EXAM: CHEST  2 VIEW  COMPARISON:  Chest radiograph 04/18/2013  FINDINGS: Stable cardiac and mediastinal contours, upper limits of normal. No consolidative pulmonary opacities. No pleural effusion or pneumothorax. Bilateral glenohumeral joint and AC joint degenerative change. Left upper abdominal surgical clips.  IMPRESSION: No acute cardiopulmonary process.   Electronically Signed   By: Lovey Newcomer M.D.   On: 01/10/2014 18:00     EKG Interpretation   Date/Time:  Tuesday January 10 2014 16:47:40 EDT Ventricular Rate:  97 PR Interval:  141 QRS Duration: 79 QT Interval:  342 QTC Calculation: 434 R Axis:   48 Text Interpretation:  Sinus tachycardia Multiple ventricular premature  complexes Confirmed by Jeneen Rinks  MD, [place] ([id number]) on 01/11/2014 12:30:34 AM      MDM   Final diagnoses:  COPD exacerbation  Sciatica, left  Obesity, Class III, BMI 40-49.9 (morbid obesity)  Chronic pain syndrome  Tobacco abuse  PVC (premature ventricular contraction)   Karel Jarvis presents with wheezing, shortness of breath, chest tightness and low back pain.  Patient  with history of chronic low back pain and states that she is out of her Percocet.  Patient with history consistent with sciatic symptoms.  Gait testing deferred due to patient pain levels but strength 5 out of 5 on modified exam.  Wheezing throughout and evidence of COPD exacerbation. Will give Solu-Medrol and albuterol.  ECG nonischemic with intermittent PVCs and patient with history of same.  8:30PM The patient continues to wheeze throughout. Will give hour-long nebulizer. CBC, CMP reassuring. A troponin and BNP within normal limits. Chest x-ray without evidence of pulmonary edema, pneumonia or pneumothorax.    11:57 PM Pt ambulates with steady gait, no foot drop or leg dragging.  Pt now with clear and equal breath sounds bilaterally.   Patient ambulated in ED with O2 saturations maintained >90, no current signs of respiratory distress. Lung exam improved after nebulizer treatment. Prednisone given in the ED and pt will be d/c with 5 day burst. Pt states they are breathing at baseline. Pt has been instructed to continue using prescribed medications and to speak with PCP about today's exacerbation.    Normal neurological exam, no evidence of urinary incontinence or retention, pain is consistently reproducible. There is no evidence of AAA or concern for dissection at this time.   Patient can walk but states it is painful.  No loss of bowel or bladder control.  No concern for cauda equina.  No fever, night sweats, weight loss, h/o cancer, IVDU.  Pain treated here in the department with adequate improvement. RICE protocol and pain medicine indicated and discussed with patient. I have also discussed reasons to return immediately to the ER.  Patient expresses understanding and agrees with plan.  I have personally reviewed patient's vitals, nursing note and any pertinent labs or imaging.  I performed an undressed physical exam.    At this time, it has been determined that no acute conditions requiring  further emergency intervention. The patient/guardian have been advised of the diagnosis and plan. I reviewed all labs and imaging including any potential incidental findings. We have discussed signs and symptoms that warrant return to the ED, such as loss of bowel or bladder control, numbness, weakness, falls, increasing shortness of breath, high fevers.  Patient/guardian has voiced understanding and agreed to follow-up with the PCP or specialist in 3 days.  Vital signs are stable at discharge.   BP 128/106  Pulse 99  Temp(Src) 97.5 F (36.4 C) (Oral)  Resp 20  SpO2 95%  LMP 05/24/2003          Abigail Butts, PA-C 01/11/14 [phone]

## 2014-01-10 NOTE — ED Notes (Signed)
Respiratory called for hour long neb. 

## 2014-01-11 NOTE — ED Notes (Signed)
Pt ambulated with out oxygen and her O2 remained 93 to 94% until the last min then it dropped to 92%.

## 2014-01-12 NOTE — ED Provider Notes (Signed)
Medical screening examination/treatment/procedure(s) were performed by non-physician practitioner and as supervising physician I was immediately available for consultation/collaboration.   EKG Interpretation   Date/Time:  Tuesday January 10 2014 16:47:40 EDT Ventricular Rate:  97 PR Interval:  141 QRS Duration: 79 QT Interval:  342 QTC Calculation: 434 R Axis:   48 Text Interpretation:  Sinus tachycardia Multiple ventricular premature  complexes Confirmed by Jeneen Rinks  MD, [place] ([id number]) on 01/11/2014 12:30:34 AM        Tanna Furry, MD 01/12/14 [phone]

## 2014-01-17 ENCOUNTER — Ambulatory Visit: Payer: Medicare Other | Attending: Internal Medicine | Admitting: Internal Medicine

## 2014-01-17 ENCOUNTER — Encounter: Payer: Self-pay | Admitting: Internal Medicine

## 2014-01-17 VITALS — BP 112/78 | HR 106 | Temp 97.8°F | Resp 18 | Ht 59.0 in | Wt 273.0 lb

## 2014-01-17 DIAGNOSIS — K219 Gastro-esophageal reflux disease without esophagitis: Secondary | ICD-10-CM

## 2014-01-17 DIAGNOSIS — F17200 Nicotine dependence, unspecified, uncomplicated: Secondary | ICD-10-CM

## 2014-01-17 DIAGNOSIS — Z720 Tobacco use: Secondary | ICD-10-CM

## 2014-01-17 DIAGNOSIS — Z809 Family history of malignant neoplasm, unspecified: Secondary | ICD-10-CM | POA: Diagnosis not present

## 2014-01-17 DIAGNOSIS — J45901 Unspecified asthma with (acute) exacerbation: Secondary | ICD-10-CM

## 2014-01-17 DIAGNOSIS — E785 Hyperlipidemia, unspecified: Secondary | ICD-10-CM | POA: Insufficient documentation

## 2014-01-17 DIAGNOSIS — Z8249 Family history of ischemic heart disease and other diseases of the circulatory system: Secondary | ICD-10-CM | POA: Insufficient documentation

## 2014-01-17 DIAGNOSIS — G4730 Sleep apnea, unspecified: Secondary | ICD-10-CM

## 2014-01-17 DIAGNOSIS — F3289 Other specified depressive episodes: Secondary | ICD-10-CM | POA: Diagnosis not present

## 2014-01-17 DIAGNOSIS — I10 Essential (primary) hypertension: Secondary | ICD-10-CM | POA: Diagnosis not present

## 2014-01-17 DIAGNOSIS — J441 Chronic obstructive pulmonary disease with (acute) exacerbation: Secondary | ICD-10-CM

## 2014-01-17 DIAGNOSIS — G43909 Migraine, unspecified, not intractable, without status migrainosus: Secondary | ICD-10-CM | POA: Insufficient documentation

## 2014-01-17 DIAGNOSIS — G4700 Insomnia, unspecified: Secondary | ICD-10-CM

## 2014-01-17 DIAGNOSIS — F411 Generalized anxiety disorder: Secondary | ICD-10-CM | POA: Diagnosis not present

## 2014-01-17 DIAGNOSIS — M549 Dorsalgia, unspecified: Secondary | ICD-10-CM | POA: Diagnosis not present

## 2014-01-17 DIAGNOSIS — F32A Depression, unspecified: Secondary | ICD-10-CM

## 2014-01-17 DIAGNOSIS — F329 Major depressive disorder, single episode, unspecified: Secondary | ICD-10-CM | POA: Diagnosis not present

## 2014-01-17 DIAGNOSIS — Z85038 Personal history of other malignant neoplasm of large intestine: Secondary | ICD-10-CM | POA: Insufficient documentation

## 2014-01-17 DIAGNOSIS — M1990 Unspecified osteoarthritis, unspecified site: Secondary | ICD-10-CM | POA: Insufficient documentation

## 2014-01-17 DIAGNOSIS — J449 Chronic obstructive pulmonary disease, unspecified: Secondary | ICD-10-CM | POA: Diagnosis present

## 2014-01-17 MED ORDER — OMEPRAZOLE 40 MG PO CPDR: 40.0000 mg | DELAYED_RELEASE_CAPSULE | Freq: Every day | ORAL | Status: DC

## 2014-01-17 MED ORDER — HYDROCHLOROTHIAZIDE 50 MG PO TABS: 50.0000 mg | ORAL_TABLET | Freq: Every day | ORAL | Status: DC

## 2014-01-17 MED ORDER — LORAZEPAM 1 MG PO TABS: 1.0000 mg | ORAL_TABLET | Freq: Four times a day (QID) | ORAL | Status: DC | PRN

## 2014-01-17 MED ORDER — FLUTICASONE PROPIONATE HFA 110 MCG/ACT IN AERO: 2.0000 | INHALATION_SPRAY | Freq: Two times a day (BID) | RESPIRATORY_TRACT | Status: DC

## 2014-01-17 MED ORDER — TRAZODONE HCL 50 MG PO TABS: 50.0000 mg | ORAL_TABLET | Freq: Every day | ORAL | Status: DC

## 2014-01-17 MED ORDER — SERTRALINE HCL 100 MG PO TABS: 100.0000 mg | ORAL_TABLET | Freq: Every day | ORAL | Status: DC

## 2014-01-17 NOTE — Progress Notes (Signed)
Pt here to f/u with medication refills for blood pressure medication and increased sob with fatigue Pt states she is becoming more tired with not wanting to get oob Pt continues to smoke 1 pack/day with COPD,Asthma disease Pt is waiting on applying for part D medicare to get medications Pt was in ER last Tuesday 01/10/14 for COPD exacerbation, and given breathing treatment and Prednisone Lungs diminished but clear No swelling in lower extrem noted Sats 97%r/a

## 2014-01-17 NOTE — Progress Notes (Signed)
MRN: [id number] [name]  Sex: female Age: 58 y.o. DOB: 08/14/1955  Allergies: Kiwi extract and Aspirin  Chief Complaint  Patient presents with  . Follow-up  . Asthma  . COPD  . Medication Refill    HPI: Patient is 58 y.o. female who has to of asthma/COPD, hypertension, anxiety depression, GERD comes today for followup, patient recently went to the emergency room with symptoms of wheezing and shortness of breath, EMR reviewed she was given treatment had a chest x-ray done which was negative for any infiltrates or infection, she was also prescribed prednisone which she has been taking reports some improvement but still getting short of breath, she has albuterol inhaler which she used last night, she is active smoker, I have advised patient to quit smoking, patient also requesting refill on anxiety medication she's also taking Zoloft as per patient is not much helping, she also has history of chronic back pain following up with pain management.  Past Medical History  Diagnosis Date  . Hypertension   . Asthma   . COPD (chronic obstructive pulmonary disease)   . Anxiety   . Cancer   . Colon cancer   . Dyslipidemia   . Migraine headache   . Chronic bronchitis   . Uterine fibroid   . Ovarian cyst   . GERD (gastroesophageal reflux disease)   . Morbid obesity   . Obstructive sleep apnea     mild  . Chronic pain   . Chronic back pain   . Arthritis   . Osteoarthritis   . Depression     Past Surgical History  Procedure Laterality Date  . Knee arthroscopy      bil.  . Carpal tunnel release      rt  . Mouth surgery      teeth extraction  . Cesarean section    . Subtotal colectomy    . Colonoscopy    . Tubal ligation        Medication List       This list is accurate as of: 01/17/14 10:32 AM.  Always use your most recent med list.               albuterol 108 (90 BASE) MCG/ACT inhaler  Commonly known as:  PROVENTIL HFA;VENTOLIN HFA  Inhale 2 puffs  into the lungs every 6 (six) hours as needed for shortness of breath. For shortness of breeath     fluticasone 110 MCG/ACT inhaler  Commonly known as:  FLOVENT HFA  Inhale 2 puffs into the lungs every 12 (twelve) hours.     hydrochlorothiazide 50 MG tablet  Commonly known as:  HYDRODIURIL  Take 1 tablet (50 mg total) by mouth daily.     ibuprofen 800 MG tablet  Commonly known as:  ADVIL,MOTRIN  Take 1 tablet by mouth 2 (two) times daily as needed.     LORazepam 1 MG tablet  Commonly known as:  ATIVAN  Take 1 tablet (1 mg total) by mouth every 6 (six) hours as needed for anxiety (anxiety).     omeprazole 40 MG capsule  Commonly known as:  PRILOSEC  Take 1 capsule (40 mg total) by mouth daily.     oxyCODONE-acetaminophen 5-325 MG per tablet  Commonly known as:  PERCOCET/ROXICET  Take 1-2 tablets by mouth every 6 (six) hours as needed for severe pain.     oxyCODONE-acetaminophen 5-325 MG per tablet  Commonly known as:  PERCOCET  Take 1-2 tablets by mouth  every 4 (four) hours as needed.     predniSONE 20 MG tablet  Commonly known as:  DELTASONE  Take 2 tablets (40 mg total) by mouth daily.     sertraline 100 MG tablet  Commonly known as:  ZOLOFT  Take 1 tablet (100 mg total) by mouth daily.     traZODone 50 MG tablet  Commonly known as:  DESYREL  Take 1 tablet (50 mg total) by mouth at bedtime.        Meds ordered this encounter  Medications  . hydrochlorothiazide (HYDRODIURIL) 50 MG tablet    Sig: Take 1 tablet (50 mg total) by mouth daily.    Dispense:  30 tablet    Refill:  3  . LORazepam (ATIVAN) 1 MG tablet    Sig: Take 1 tablet (1 mg total) by mouth every 6 (six) hours as needed for anxiety (anxiety).    Dispense:  30 tablet    Refill:  0  . omeprazole (PRILOSEC) 40 MG capsule    Sig: Take 1 capsule (40 mg total) by mouth daily.    Dispense:  30 capsule    Refill:  3  . sertraline (ZOLOFT) 100 MG tablet    Sig: Take 1 tablet (100 mg total) by mouth daily.     Dispense:  30 tablet    Refill:  3  . traZODone (DESYREL) 50 MG tablet    Sig: Take 1 tablet (50 mg total) by mouth at bedtime.    Dispense:  30 tablet    Refill:  2  . fluticasone (FLOVENT HFA) 110 MCG/ACT inhaler    Sig: Inhale 2 puffs into the lungs every 12 (twelve) hours.    Dispense:  1 Inhaler    Refill:  12    Immunization History  Administered Date(s) Administered  . Influenza Split 08/19/2012  . Influenza,inj,Quad PF,36+ Mos 04/20/2013  . Pneumococcal Polysaccharide-23 10/24/2011, 08/19/2012    Family History  Problem Relation Age of Onset  . Uterine cancer Mother   . Stroke Father   . Colon cancer Maternal Uncle   . Diabetes Father   . Ovarian cancer Mother   . Hypertension Father   . Breast cancer Maternal Grandmother   . Diabetes Sister   . Asthma Child   . Asthma Child     History  Substance Use Topics  . Smoking status: Current Some Day Smoker -- 1.00 packs/day for 30 years    Types: Cigarettes  . Smokeless tobacco: Never Used     Comment: tobacco info given 07/21/2011  . Alcohol Use: No    Review of Systems   As noted in HPI  Filed Vitals:   01/17/14 0948  BP: 112/78  Pulse: 106  Temp: 97.8 F (36.6 C)  Resp: 18    Physical Exam  Physical Exam  Constitutional:  Obese female not in acute distress   Eyes: EOM are normal. Pupils are equal, round, and reactive to light.  Cardiovascular: Normal rate and regular rhythm.   Pulmonary/Chest: She has no wheezes. She has no rales.  Musculoskeletal: She exhibits no edema.    CBC    Component Value Date/Time   WBC 7.2 01/10/2014 1727   RBC 4.48 01/10/2014 1727   HGB 14.6 01/10/2014 1727   HCT 44.9 01/10/2014 1727   PLT 167 01/10/2014 1727   MCV 100.2* 01/10/2014 1727   LYMPHSABS 2.8 08/28/2013 2123   MONOABS 0.6 08/28/2013 2123   EOSABS 0.1 08/28/2013 2123   BASOSABS 0.0 08/28/2013  2123    CMP     Component Value Date/Time   NA 142 01/10/2014 1727   K 3.1* 01/10/2014 1727   CL 101 01/10/2014 1727    CO2 30 01/10/2014 1727   GLUCOSE 102* 01/10/2014 1727   BUN 13 01/10/2014 1727   CREATININE 0.96 01/10/2014 1727   CREATININE 0.80 09/28/2013 1228   CALCIUM 9.3 01/10/2014 1727   PROT 7.3 01/10/2014 1727   ALBUMIN 3.1* 01/10/2014 1727   AST 16 01/10/2014 1727   ALT 13 01/10/2014 1727   ALKPHOS 82 01/10/2014 1727   BILITOT <0.2* 01/10/2014 1727   GFRNONAA 65* 01/10/2014 1727   GFRNONAA 83 09/28/2013 1228   GFRAA 75* 01/10/2014 1727   GFRAA >89 09/28/2013 1228    Lab Results  Component Value Date/Time   CHOL 161 10/22/2011  5:53 AM    No components found with this basename: hga1c    Lab Results  Component Value Date/Time   AST 16 01/10/2014  5:27 PM    Assessment and Plan  Essential hypertension - Plan: Blood pressure is well controlled continue with her hydrochlorothiazide (HYDRODIURIL) 50 MG tablet  Gastroesophageal reflux disease, esophagitis presence not specified - Plan: Lifestyle modification and omeprazole (PRILOSEC) 40 MG capsule  Depression/Anxiety state, unspecified - Plan: I have increased the dose of sertraline (ZOLOFT) 100 MG tablet and prescribed LORazepam (ATIVAN) 1 MG tablet to use when necessary, patient also be scheduled to see a Education officer, museum for the resources to see a psychiatrist. Her  Asthma exacerbation in COPD - Plan: Ambulatory referral to Pulmonology, have started patient on fluticasone (FLOVENT HFA) 110 MCG/ACT inhaler, she'll use albuterol when necessary  Insomnia - Plan: traZODone (DESYREL) 50 MG tablet  Anxiety state, unspecified - Plan: LORazepam (ATIVAN) 1 MG tablet  Tobacco abuse Advise patient to quit smoking.   Return in about 3 months (around 04/19/2014) for hypertension, depression, Education officer, museum Visit.  Lorayne Marek, MD

## 2014-01-21 IMAGING — CR DG ANKLE COMPLETE 3+V*L*
3 series · 3 of 3 positions shown · non-contrast
Comparison: None

CLINICAL DATA: Fall with left ankle pain.

LEFT ANKLE COMPLETE - 3+ VIEW

[x ankle ap left]
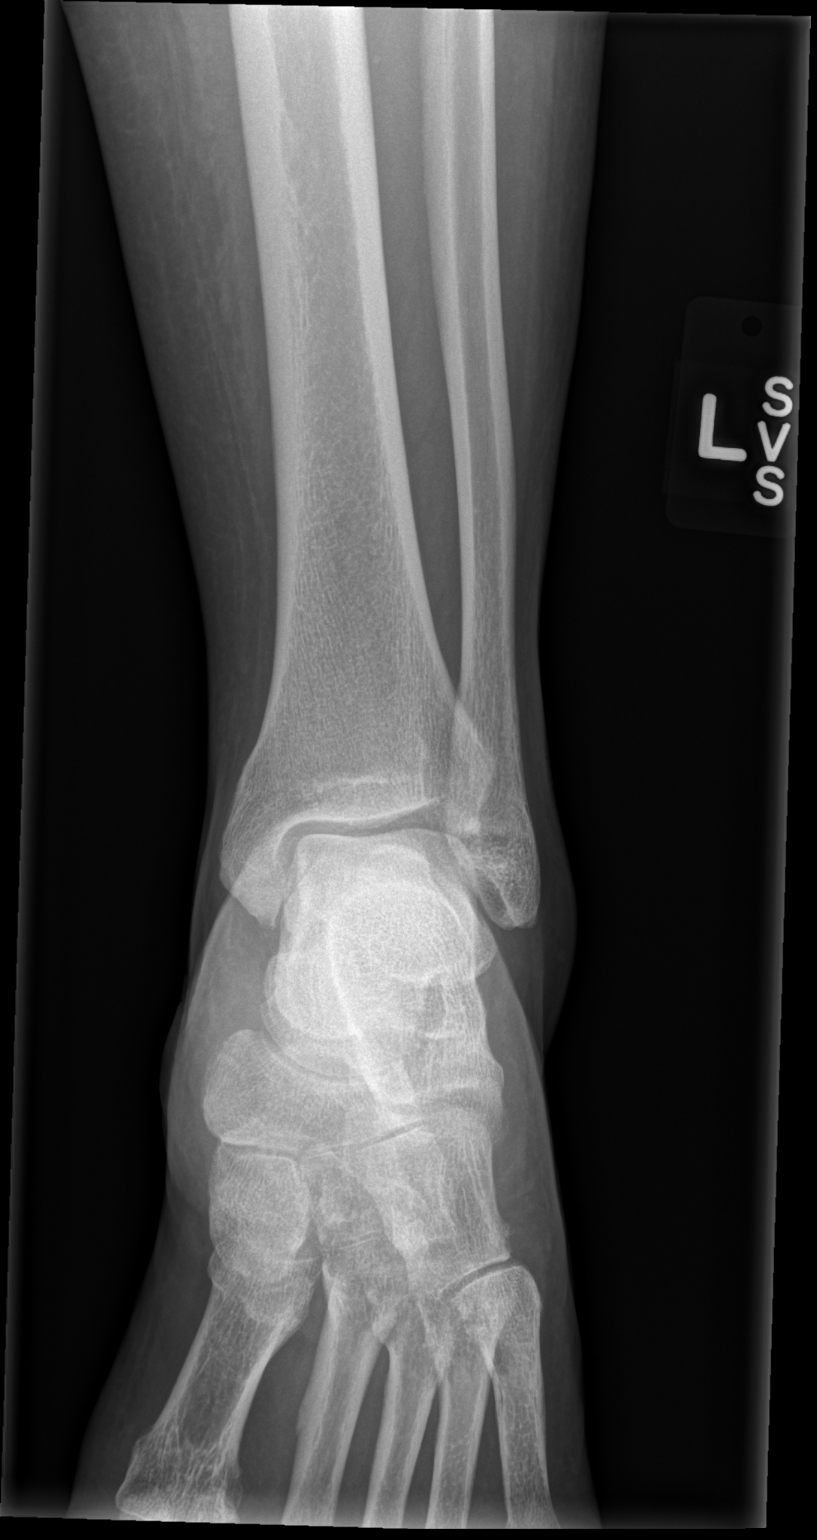

[x ankle obl left]
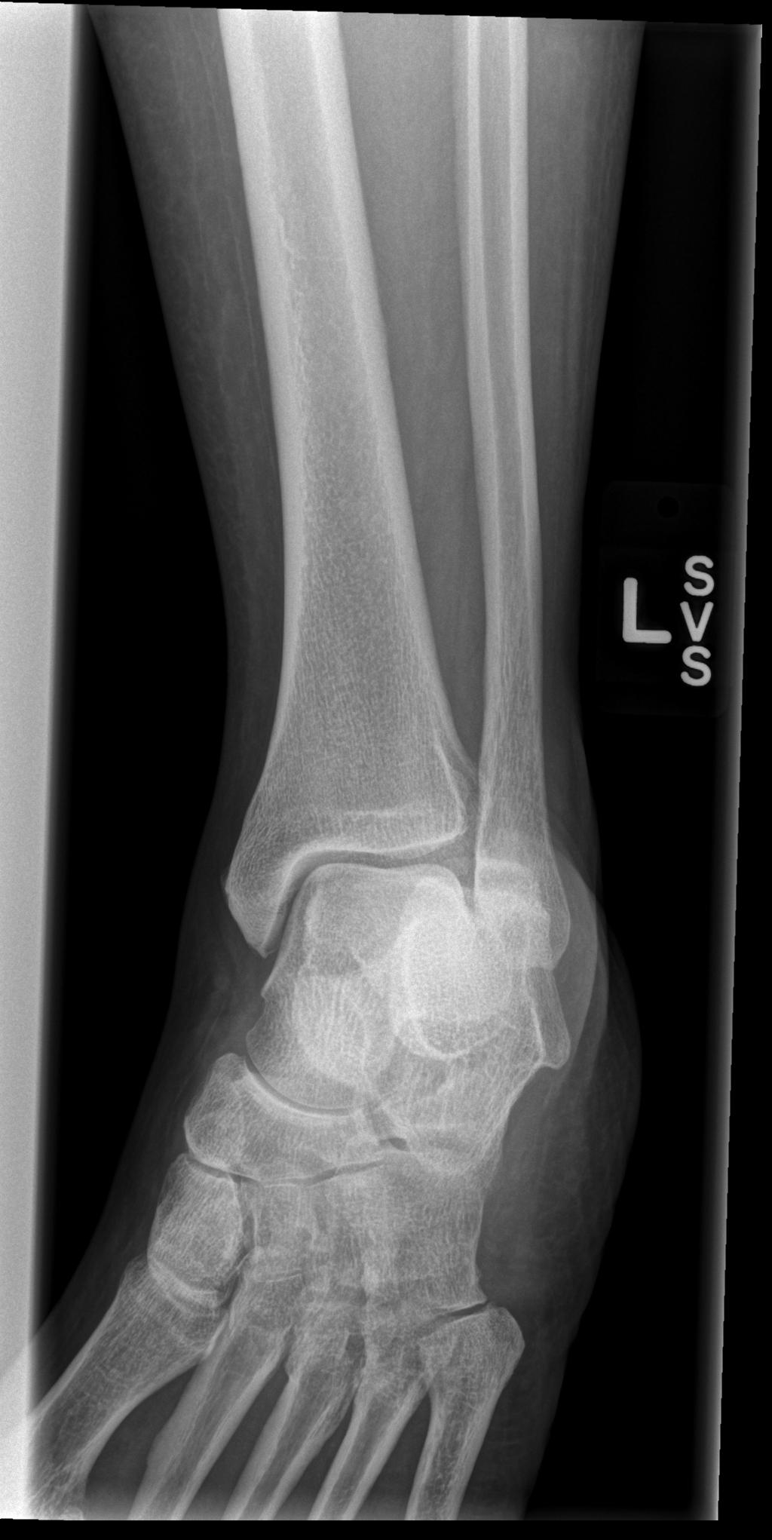

[x ankle lat left]
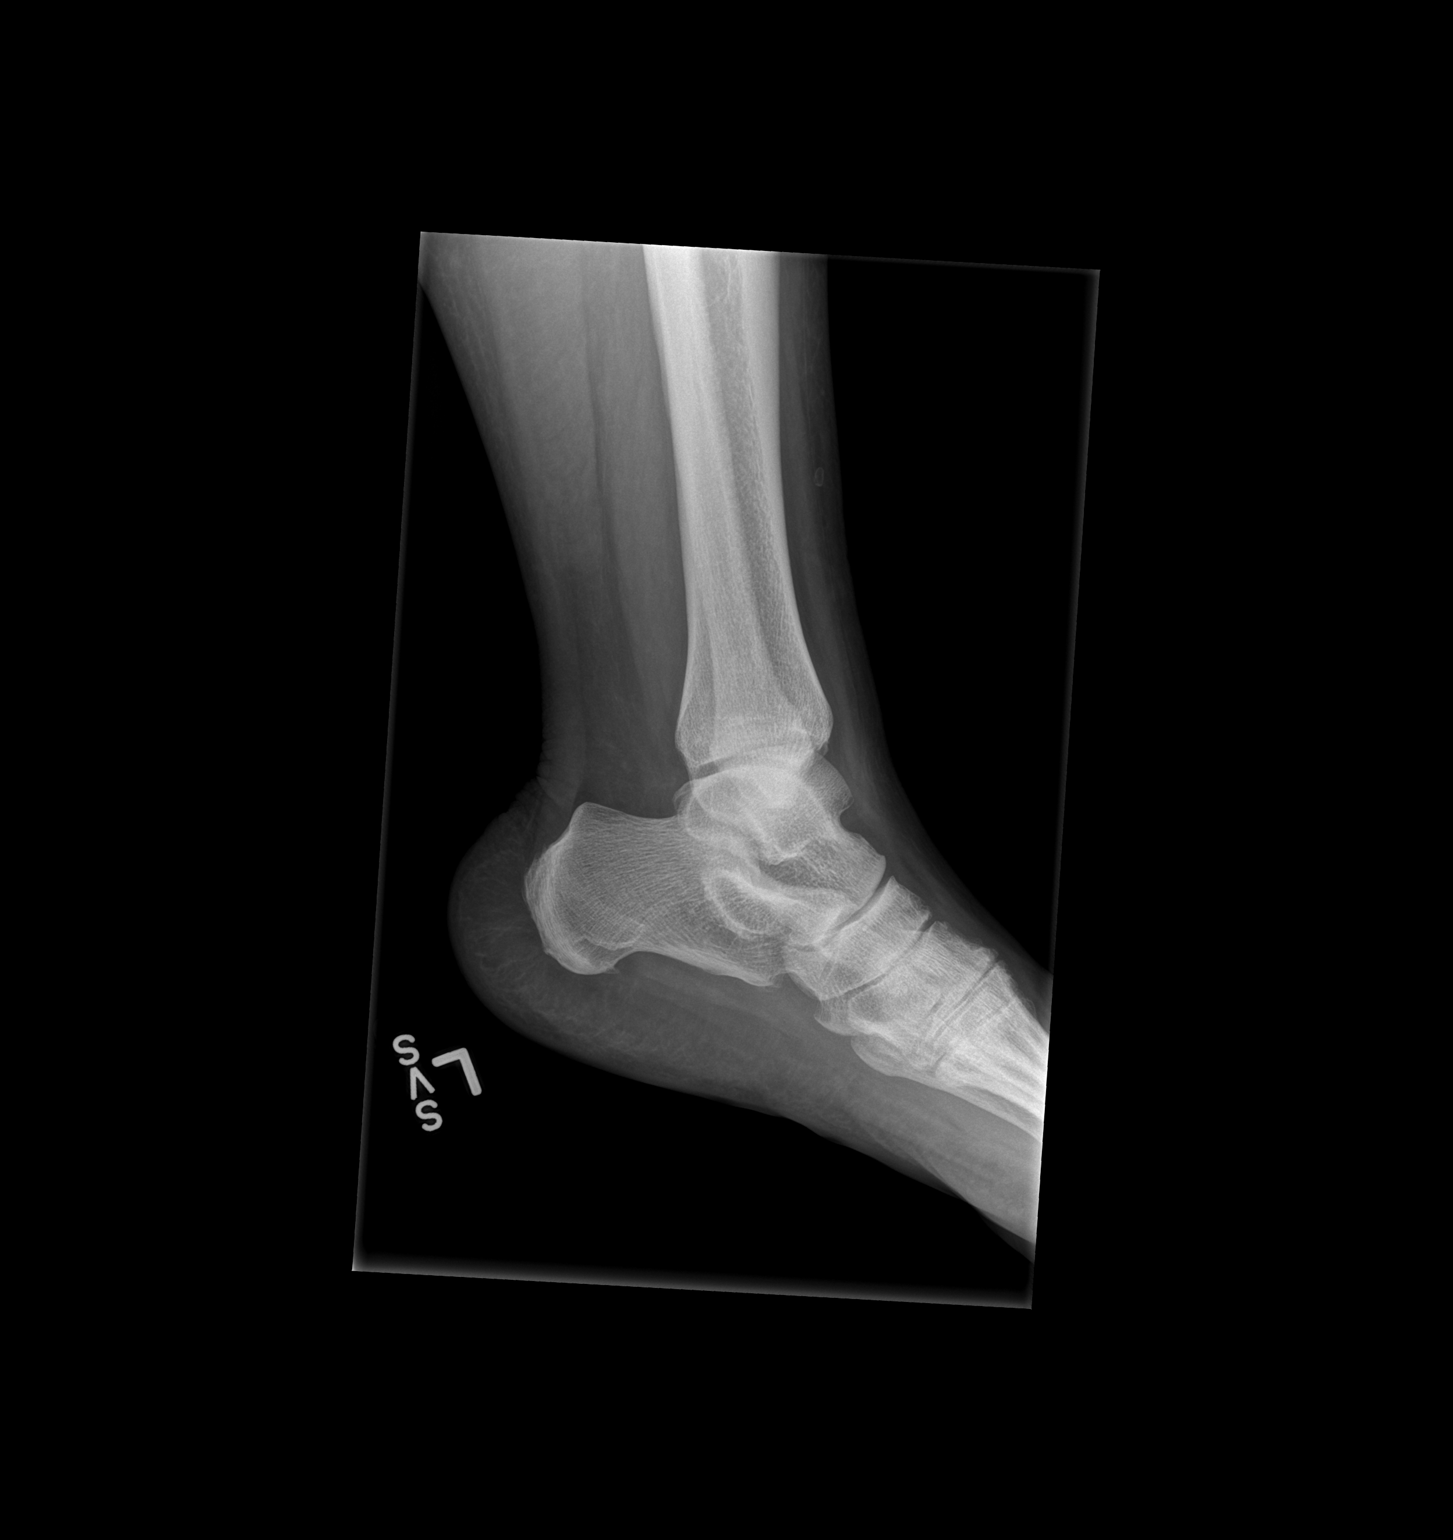

[3 of 3 positions shown; findings below may reference images not displayed]

FINDINGS: No acute fracture or dislocation.  Soft tissues are
unremarkable.  Mild degenerative changes are seen medially.
IMPRESSION: No acute fracture.

## 2014-01-21 IMAGING — CR DG KNEE COMPLETE 4+V*L*
4 series · 4 of 4 positions shown · non-contrast
Comparison: None.

CLINICAL DATA: Fall with left knee pain.

LEFT KNEE - COMPLETE 4+ VIEW

[t knee ap left]
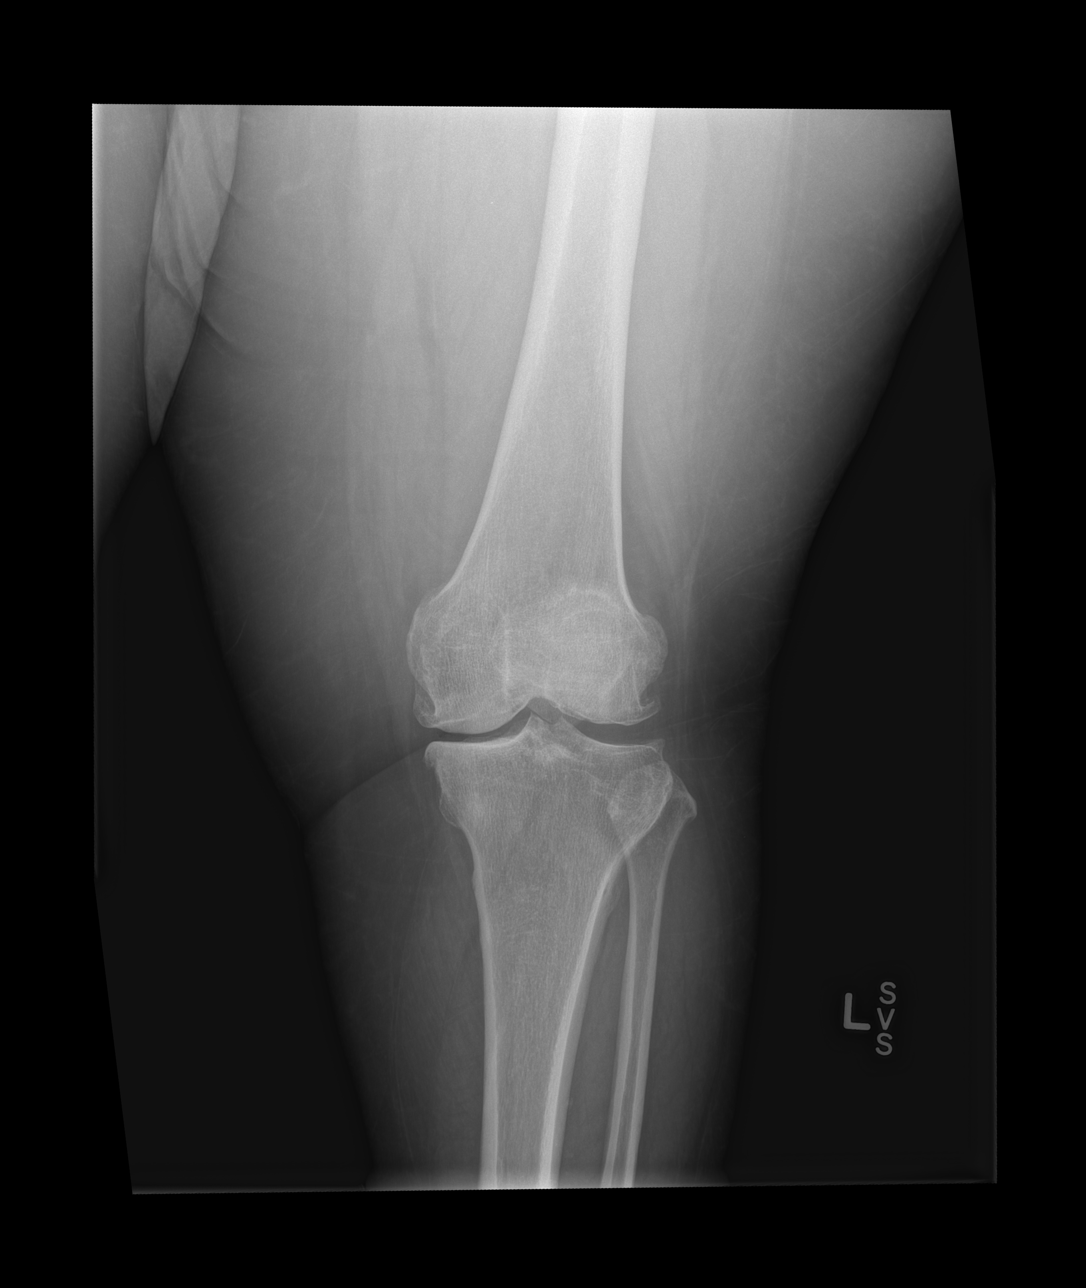

[t knee obl left (1 of 2)]
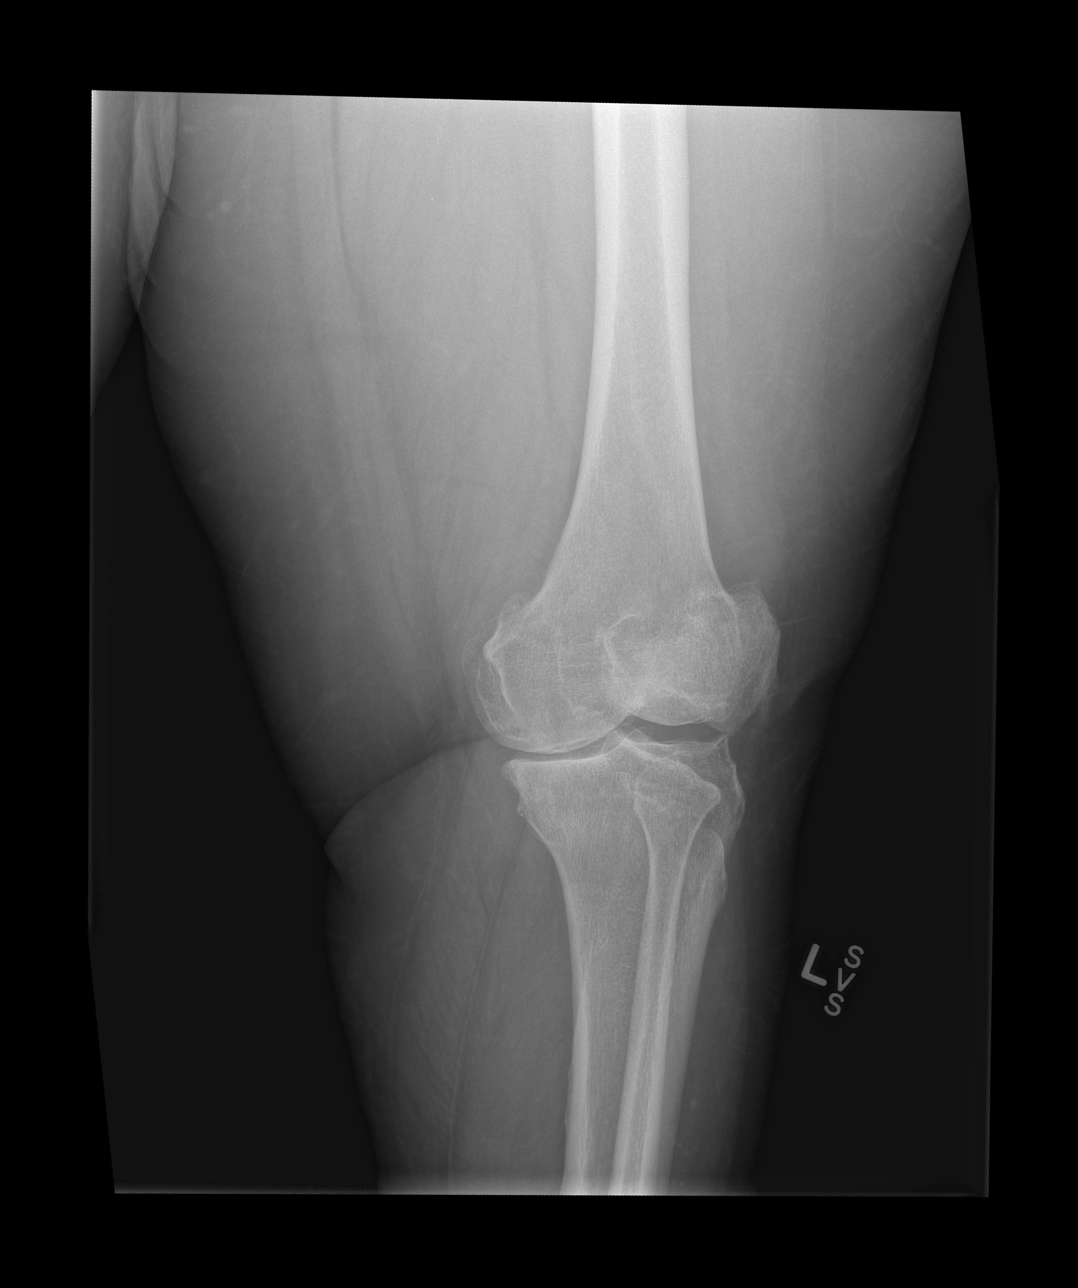

[t knee obl left (2 of 2)]
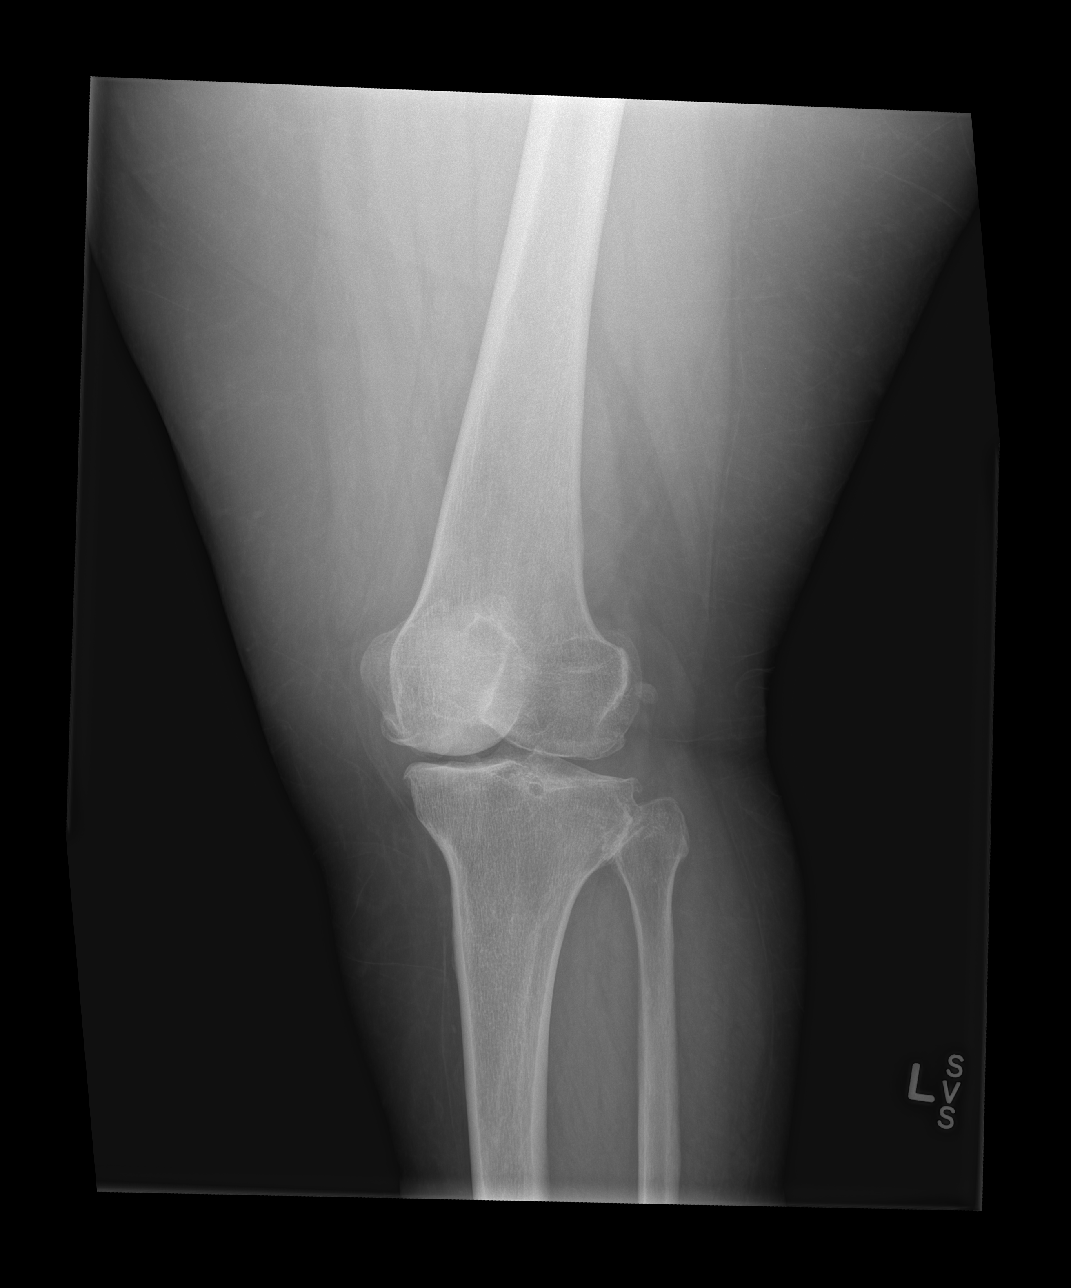

[t knee lat left]
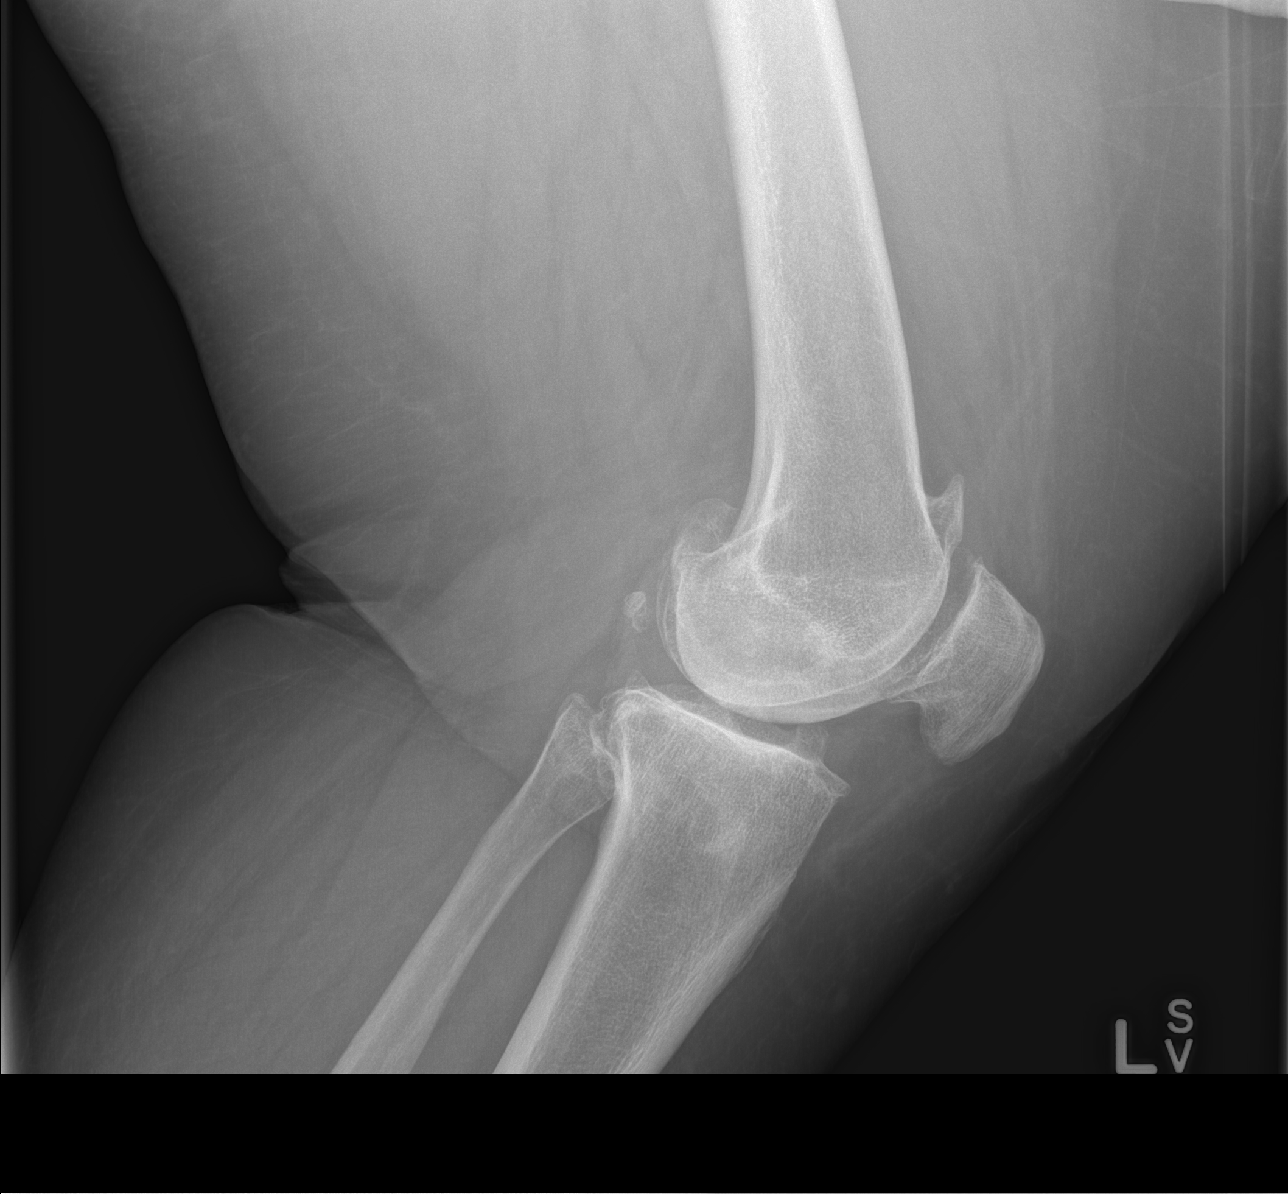

[4 of 4 positions shown; findings below may reference images not displayed]

FINDINGS: Moderate tricompartmental degenerative osteoarthritis
present.  No evidence of fracture, dislocation or joint effusion.
No bony lesions identified.
IMPRESSION: Degenerative osteoarthritis.  No acute findings.

## 2014-01-21 IMAGING — CR DG LUMBAR SPINE COMPLETE 4+V
5 series · 5 of 5 positions shown · non-contrast
Comparison: MRI of the lumbar spine dated 01/03/2012

CLINICAL DATA: Fall with low back pain.

LUMBAR SPINE - COMPLETE 4+ VIEW

[t lumbar spine ap]
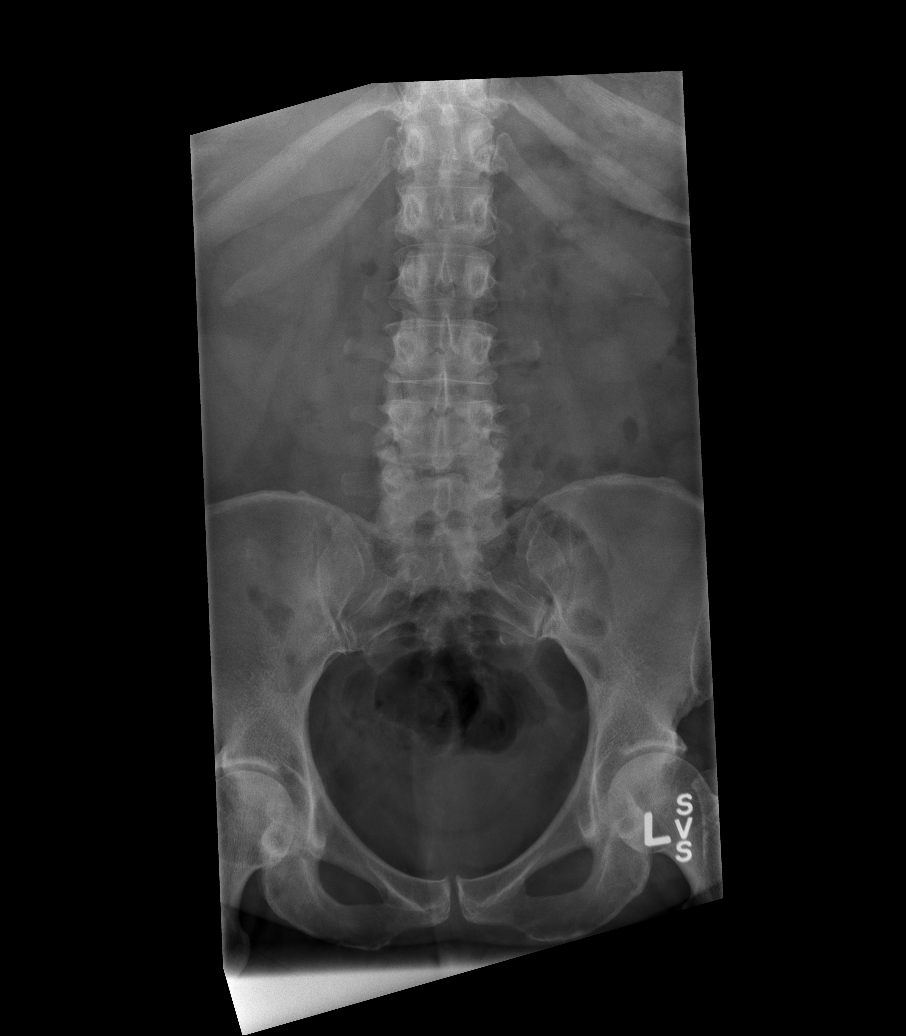

[t lumbar spine obl (1 of 2)]
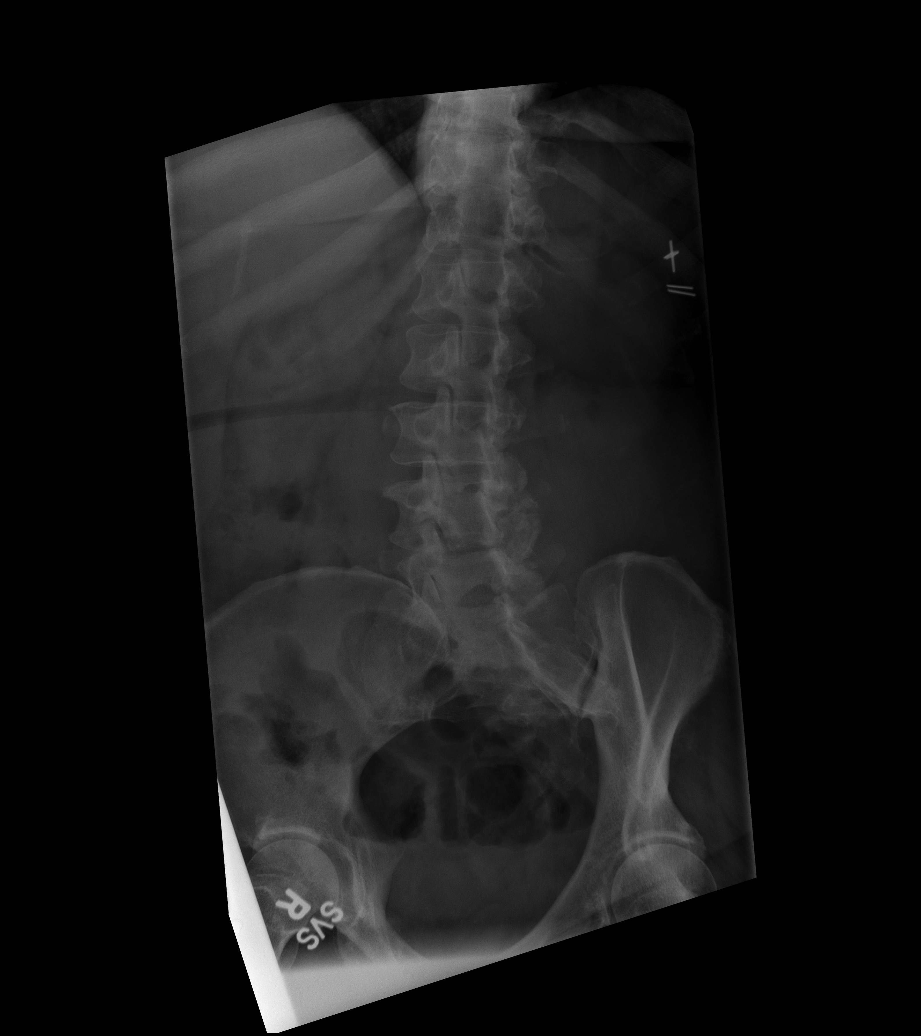

[t lumbar spine obl (2 of 2)]
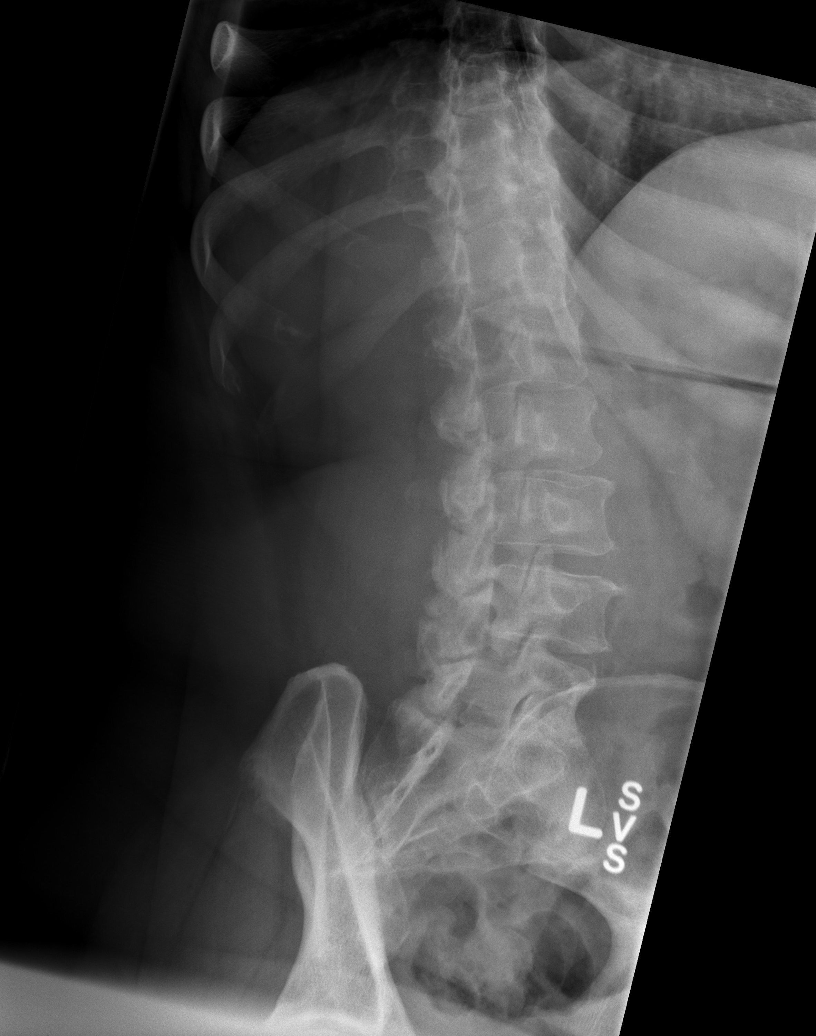

[t lumbar spine lat]
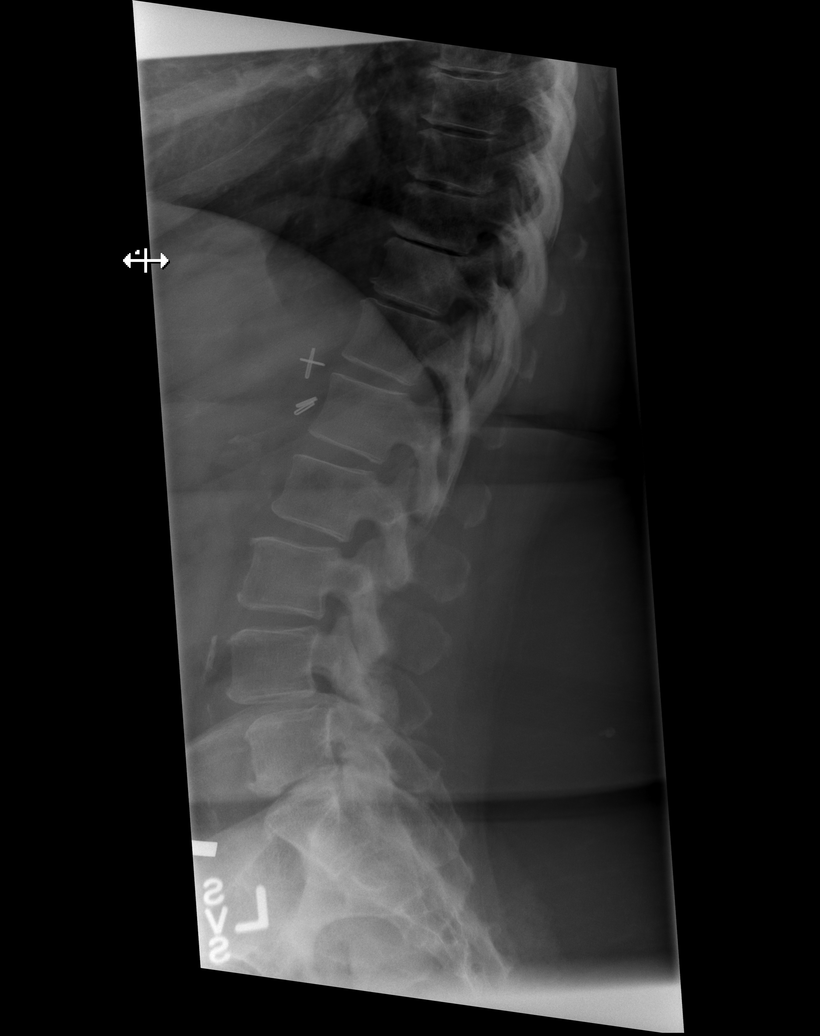

[t lumbar l-5 s-1 spot]
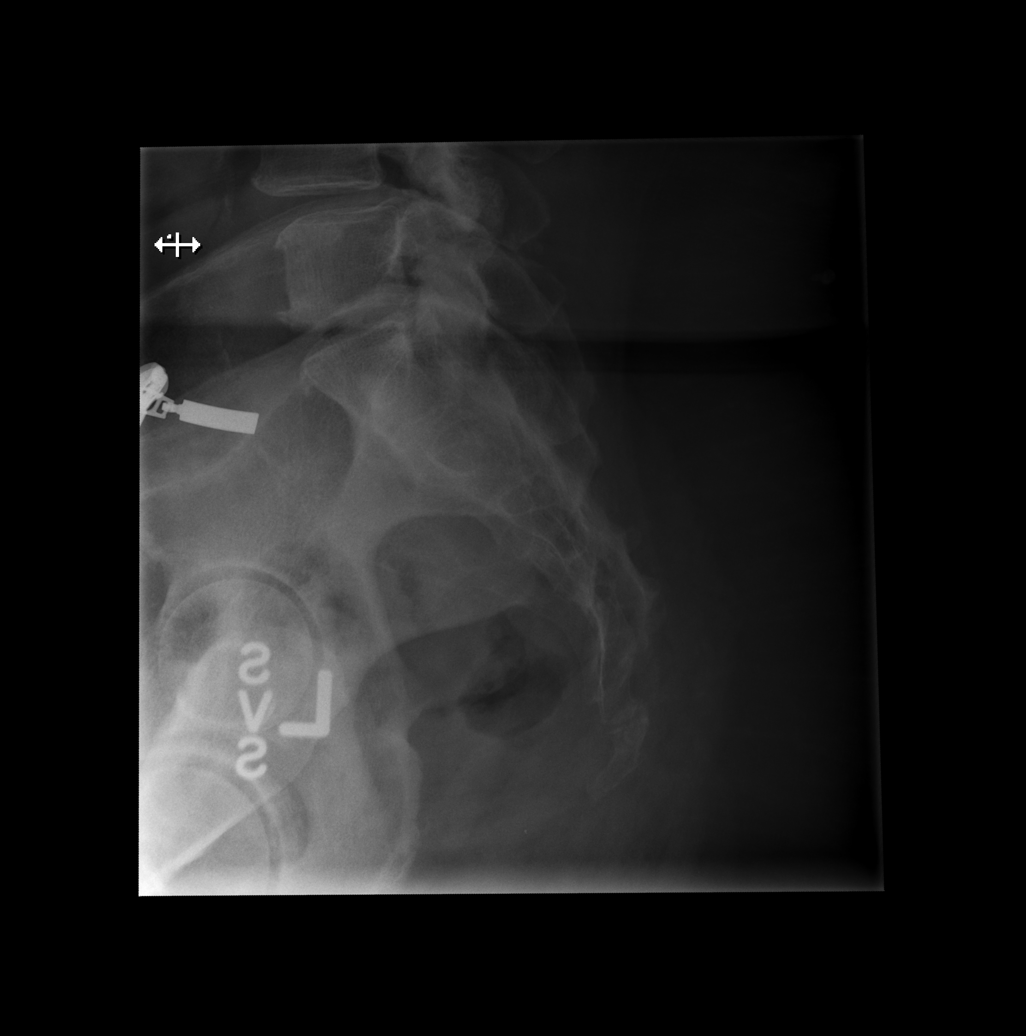

[5 of 5 positions shown; findings below may reference images not displayed]

FINDINGS: Spondylosis again identified, primarily at L4-5 and L5-
S1.  There is a mild, grade 1 anterolisthesis of L4 on L5.  No
acute fracture identified.
IMPRESSION: Stable spondylosis of the lower lumbar spine when compared to the
prior MRI.  No acute fracture is identified.

## 2014-01-31 DIAGNOSIS — M51379 Other intervertebral disc degeneration, lumbosacral region without mention of lumbar back pain or lower extremity pain: Secondary | ICD-10-CM | POA: Diagnosis not present

## 2014-01-31 DIAGNOSIS — M5137 Other intervertebral disc degeneration, lumbosacral region: Secondary | ICD-10-CM | POA: Diagnosis not present

## 2014-01-31 DIAGNOSIS — M1990 Unspecified osteoarthritis, unspecified site: Secondary | ICD-10-CM | POA: Diagnosis not present

## 2014-01-31 DIAGNOSIS — G894 Chronic pain syndrome: Secondary | ICD-10-CM | POA: Diagnosis not present

## 2014-01-31 DIAGNOSIS — Z79899 Other long term (current) drug therapy: Secondary | ICD-10-CM | POA: Diagnosis not present

## 2014-02-07 DIAGNOSIS — I82409 Acute embolism and thrombosis of unspecified deep veins of unspecified lower extremity: Secondary | ICD-10-CM

## 2014-02-07 DIAGNOSIS — I2699 Other pulmonary embolism without acute cor pulmonale: Secondary | ICD-10-CM

## 2014-02-07 HISTORY — DX: Other pulmonary embolism without acute cor pulmonale: I26.99

## 2014-02-07 HISTORY — DX: Acute embolism and thrombosis of unspecified deep veins of unspecified lower extremity: I82.409

## 2014-02-08 ENCOUNTER — Ambulatory Visit (INDEPENDENT_AMBULATORY_CARE_PROVIDER_SITE_OTHER): Payer: Medicare Other | Admitting: Pulmonary Disease

## 2014-02-08 ENCOUNTER — Encounter: Payer: Self-pay | Admitting: Pulmonary Disease

## 2014-02-08 VITALS — BP 124/70 | HR 53 | Ht 59.0 in | Wt 274.0 lb

## 2014-02-08 DIAGNOSIS — J441 Chronic obstructive pulmonary disease with (acute) exacerbation: Secondary | ICD-10-CM | POA: Diagnosis not present

## 2014-02-08 DIAGNOSIS — J449 Chronic obstructive pulmonary disease, unspecified: Secondary | ICD-10-CM | POA: Diagnosis not present

## 2014-02-08 DIAGNOSIS — J4489 Other specified chronic obstructive pulmonary disease: Secondary | ICD-10-CM

## 2014-02-08 DIAGNOSIS — Z23 Encounter for immunization: Secondary | ICD-10-CM

## 2014-02-08 DIAGNOSIS — F17200 Nicotine dependence, unspecified, uncomplicated: Secondary | ICD-10-CM | POA: Diagnosis not present

## 2014-02-08 DIAGNOSIS — J45909 Unspecified asthma, uncomplicated: Secondary | ICD-10-CM

## 2014-02-08 DIAGNOSIS — G4733 Obstructive sleep apnea (adult) (pediatric): Secondary | ICD-10-CM | POA: Insufficient documentation

## 2014-02-08 DIAGNOSIS — Z720 Tobacco use: Secondary | ICD-10-CM

## 2014-02-08 NOTE — Assessment & Plan Note (Addendum)
That she may have asthma given her wheezing, but I was surprised to see no obstruction non today's simple spirometry.  Clearly the biggest problem contributing to her shortness of breath is deconditioning and morbid obesity. She needs to stop smoking immediately. Further, we need to find a way for her to be only get medications affordably.  Plan: -obtain full PFT -O2 therapy: Not indicated -Immunizations: Flu shot today -Tobacco use: Advised at length to quit -Exercise: Encouraged regular exercise -Bronchodilator therapy: Start Advair, will apply for financial assistance, continue prn albuterol -Exacerbation prevention: QUIT SMOKING!

## 2014-02-08 NOTE — Patient Instructions (Addendum)
You need to find a way to stop taking ativan and to cut back or stop percocet because these medicines are making you sleepy and interfering with your sleep apnea You need to have someone else drive for you around until you have your CPAP titration study You need to have a CPAP titration study, we will arrange this  You need to have full pulmonary function tests  Stop smoking, call 1-800-QUIT-NOW to get free counseling and nicotine replacement from the state of [place]  Take the Advair one puff twice a day no matter how you feel  We will see you back in 6 weeks or sooner if needed

## 2014-02-08 NOTE — Progress Notes (Signed)
   Subjective:    Patient ID: Brooke [name], female    DOB: 02-02-1956, 58 y.o.   MRN: [id number]  HPI    Review of Systems  Constitutional: Negative for fever and unexpected weight change.  HENT: Positive for congestion. Negative for dental problem, ear pain, nosebleeds, postnasal drip, rhinorrhea, sinus pressure, sneezing, sore throat and trouble swallowing.   Eyes: Negative for redness and itching.  Respiratory: Positive for cough, chest tightness and shortness of breath. Negative for wheezing.   Cardiovascular: Negative for palpitations and leg swelling.  Gastrointestinal: Negative for nausea and vomiting.  Genitourinary: Negative for dysuria.  Musculoskeletal: Negative for joint swelling.  Skin: Negative for rash.  Neurological: Negative for headaches.  Hematological: Does not bruise/bleed easily.  Psychiatric/Behavioral: Negative for dysphoric mood. The patient is not nervous/anxious.        Objective:   Physical Exam        Assessment & Plan:

## 2014-02-08 NOTE — Assessment & Plan Note (Signed)
Her fatigue and daytime somnolence is due to her obstructive sleep apnea. However, her ongoing use of Ativan as well as narcotics (Percocet) are contributing significantly to this. Today we discussed the importance of stopping these medicines if possible. I have asked her to discuss her anxiety treatment with her primary care physician to see if there is an alternative medicine to benzodiazepines. Further, she needs to treat her back pain with as many nonnarcotic medicines as possible  Obviously her morbid obesity is contributing significantly to her obstructive sleep apnea. We discussed the importance of diet weight loss.  Finally, I advised that she should not drive until she has a CPAP titration study so that we can start using CPAP.  Plan: -Sleep hygiene reviewed -CPAP titration study -Discuss benzodiazepine and narcotic use with primary care physician to try to find alternatives

## 2014-02-08 NOTE — Progress Notes (Signed)
Subjective:    Patient ID: Brooke [name], female    DOB: April 23, 1956, 58 y.o.   MRN: [id number]  HPI  [name] is here to see me for asthma and COPD. She tells me that she has been suffering from shortness of breath and she "just feels awfull".  She has been feeling short of breath for a long time.  She has known about her diagnosis of asthma and COPD for only about two year. She never had asthma as a child.  She currently smokes 1/2 ppd and has smoked as much as 1 ppd.  She started smoking 1 pack per day.  She was working as a Recruitment consultant a few yers ago and she gota lot of shortness of breath from the fumes on the bus.  One time she actually passed out on the bus.  This was 8 to 10 year ago.    She gets dyspnea sometimes at rest and any activity makes her feel worse.  She hsan't found anything that helps with the dyspnea.  She used to cough a lot, but this hasn't been as bad lately.  She sometimes produce brown mucus but no bloody.    She will occasionally have chest pain in the middle of her chest which she describes as an aching pain.  This is random and sometimes occurs just at rest.  There are no clear predictors for this.    She has been told that she has obstructive sleep apnea and had a sleep study.    She currently only takes albuterol because she can't afford the other inhalers.  She doesn't think that albuterol helps much with dyspnea.  Some of the other inhalers helped with mucus but not   Past Medical History  Diagnosis Date  . Hypertension   . Asthma   . COPD (chronic obstructive pulmonary disease)   . Anxiety   . Cancer   . Colon cancer   . Dyslipidemia   . Migraine headache   . Chronic bronchitis   . Uterine fibroid   . Ovarian cyst   . GERD (gastroesophageal reflux disease)   . Morbid obesity   . Obstructive sleep apnea     mild  . Chronic pain   . Chronic back pain   . Arthritis   . Osteoarthritis   . Depression      Family History  Problem Relation  Age of Onset  . Uterine cancer Mother   . Stroke Father   . Colon cancer Maternal Uncle   . Diabetes Father   . Ovarian cancer Mother   . Hypertension Father   . Breast cancer Maternal Grandmother   . Diabetes Sister   . Asthma Child   . Asthma Child      History   Social History  . Marital Status: Widowed    Spouse Name: N/A    Number of Children: 2  . Years of Education: N/A   Occupational History  . disabled    Social History Main Topics  . Smoking status: Current Some Day Smoker -- 1.00 packs/day for 37 years    Types: Cigarettes  . Smokeless tobacco: Never Used     Comment: trying to quit, down to .5ppd X2 years ago.   . Alcohol Use: No  . Drug Use: No  . Sexual Activity: Yes   Other Topics Concern  . Not on file   Social History Narrative   Lives alone.  Widowed but has a female companion who  helps out.  Ambulates with a cane.  Drinks coffee daily.              Allergies  Allergen Reactions  . Kiwi Extract Hives and Swelling  . Aspirin Nausea Only    Can take as long as enteric coated     Outpatient Prescriptions Prior to Visit  Medication Sig Dispense Refill  . albuterol (PROVENTIL HFA;VENTOLIN HFA) 108 (90 BASE) MCG/ACT inhaler Inhale 2 puffs into the lungs every 6 (six) hours as needed for shortness of breath. For shortness of breeath  3.7 g  1  . fluticasone (FLOVENT HFA) 110 MCG/ACT inhaler Inhale 2 puffs into the lungs every 12 (twelve) hours.  1 Inhaler  12  . hydrochlorothiazide (HYDRODIURIL) 50 MG tablet Take 1 tablet (50 mg total) by mouth daily.  30 tablet  3  . ibuprofen (ADVIL,MOTRIN) 800 MG tablet Take 1 tablet by mouth 2 (two) times daily as needed.      Marland Kitchen LORazepam (ATIVAN) 1 MG tablet Take 1 tablet (1 mg total) by mouth every 6 (six) hours as needed for anxiety (anxiety).  30 tablet  0  . omeprazole (PRILOSEC) 40 MG capsule Take 1 capsule (40 mg total) by mouth daily.  30 capsule  3  . oxyCODONE-acetaminophen (PERCOCET) 5-325 MG per  tablet Take 1-2 tablets by mouth every 4 (four) hours as needed.  15 tablet  0  . predniSONE (DELTASONE) 20 MG tablet Take 2 tablets (40 mg total) by mouth daily.  10 tablet  0  . sertraline (ZOLOFT) 100 MG tablet Take 1 tablet (100 mg total) by mouth daily.  30 tablet  3  . traZODone (DESYREL) 50 MG tablet Take 1 tablet (50 mg total) by mouth at bedtime.  30 tablet  2  . oxyCODONE-acetaminophen (PERCOCET/ROXICET) 5-325 MG per tablet Take 1-2 tablets by mouth every 6 (six) hours as needed for severe pain.  15 tablet  0   No facility-administered medications prior to visit.      Review of Systems  Constitutional: Positive for fatigue. Negative for fever, chills, diaphoresis and appetite change.  HENT: Negative for congestion, hearing loss, nosebleeds, postnasal drip, rhinorrhea, sinus pressure, sore throat and trouble swallowing.   Eyes: Negative for discharge, redness and visual disturbance.  Respiratory: Positive for cough, shortness of breath and wheezing. Negative for choking and chest tightness.   Cardiovascular: Negative for chest pain and leg swelling.  Gastrointestinal: Negative for nausea, abdominal pain, diarrhea, constipation and blood in stool.  Genitourinary: Negative for dysuria, frequency and hematuria.  Musculoskeletal: Negative for arthralgias, joint swelling, myalgias and neck stiffness.  Skin: Negative for color change, pallor and rash.  Neurological: Positive for headaches. Negative for dizziness, seizures, facial asymmetry, speech difficulty, light-headedness and numbness.  Hematological: Negative for adenopathy. Does not bruise/bleed easily.       Objective:   Physical Exam Filed Vitals:   02/08/14 1338  BP: 124/70  Pulse: 53  Height: 4\' 11"  (1.499 m)  Weight: 274 lb (124.286 kg)  SpO2: 96%   Gen: morbidly obese, sleepy, no acute distress HEENT: NCAT, PERRL, EOMi, OP clear, neck supple without masses PULM: Upper airway wheezing bilaterally CV: Regular  rhythm with occasional interruption, no mgr, no JVD AB: BS+, soft, nontender, no hsm Ext: warm, no edema, no clubbing, no cyanosis Derm: no rash or skin breakdown Neuro: A&Ox4, CN II-XII intact, strength 5/5 in all 4 extremities      Assessment & Plan:   Asthma, chronic That she may  have asthma given her wheezing, but I was surprised to see no obstruction non today's simple spirometry.  Clearly the biggest problem contributing to her shortness of breath is deconditioning and morbid obesity. She needs to stop smoking immediately. Further, we need to find a way for her to be only get medications affordably.  Plan: -obtain full PFT -O2 therapy: Not indicated -Immunizations: Flu shot today -Tobacco use: Advised at length to quit -Exercise: Encouraged regular exercise -Bronchodilator therapy: Start Advair, will apply for financial assistance, continue prn albuterol -Exacerbation prevention: QUIT SMOKING!   Obstructive sleep apnea Her fatigue and daytime somnolence is due to her obstructive sleep apnea. However, her ongoing use of Ativan as well as narcotics (Percocet) are contributing significantly to this. Today we discussed the importance of stopping these medicines if possible. I have asked her to discuss her anxiety treatment with her primary care physician to see if there is an alternative medicine to benzodiazepines. Further, she needs to treat her back pain with as many nonnarcotic medicines as possible  Obviously her morbid obesity is contributing significantly to her obstructive sleep apnea. We discussed the importance of diet weight loss.  Finally, I advised that she should not drive until she has a CPAP titration study so that we can start using CPAP.  Plan: -Sleep hygiene reviewed -CPAP titration study -Discuss benzodiazepine and narcotic use with primary care physician to try to find alternatives  Tobacco abuse Advised at length to quit and to call 1-800-QUIT-NOW for free  nicotine replacement from the state of [place].  Will discuss lung cancer screening next visit.    Updated Medication List Outpatient Encounter Prescriptions as of 02/08/2014  Medication Sig  . albuterol (PROVENTIL HFA;VENTOLIN HFA) 108 (90 BASE) MCG/ACT inhaler Inhale 2 puffs into the lungs every 6 (six) hours as needed for shortness of breath. For shortness of breeath  . fluticasone (FLOVENT HFA) 110 MCG/ACT inhaler Inhale 2 puffs into the lungs every 12 (twelve) hours.  . hydrochlorothiazide (HYDRODIURIL) 50 MG tablet Take 1 tablet (50 mg total) by mouth daily.  Marland Kitchen ibuprofen (ADVIL,MOTRIN) 800 MG tablet Take 1 tablet by mouth 2 (two) times daily as needed.  Marland Kitchen LORazepam (ATIVAN) 1 MG tablet Take 1 tablet (1 mg total) by mouth every 6 (six) hours as needed for anxiety (anxiety).  Marland Kitchen omeprazole (PRILOSEC) 40 MG capsule Take 1 capsule (40 mg total) by mouth daily.  Marland Kitchen oxyCODONE-acetaminophen (PERCOCET) 5-325 MG per tablet Take 1-2 tablets by mouth every 4 (four) hours as needed.  . predniSONE (DELTASONE) 20 MG tablet Take 2 tablets (40 mg total) by mouth daily.  . sertraline (ZOLOFT) 100 MG tablet Take 1 tablet (100 mg total) by mouth daily.  . traZODone (DESYREL) 50 MG tablet Take 1 tablet (50 mg total) by mouth at bedtime.  . [DISCONTINUED] oxyCODONE-acetaminophen (PERCOCET/ROXICET) 5-325 MG per tablet Take 1-2 tablets by mouth every 6 (six) hours as needed for severe pain.

## 2014-02-08 NOTE — Assessment & Plan Note (Signed)
Advised at length to quit and to call 1-800-QUIT-NOW for free nicotine replacement from the state of [place].  Will discuss lung cancer screening next visit.

## 2014-02-12 ENCOUNTER — Encounter (HOSPITAL_COMMUNITY): Payer: Self-pay | Admitting: Emergency Medicine

## 2014-02-12 ENCOUNTER — Emergency Department (HOSPITAL_COMMUNITY): Payer: Medicare Other

## 2014-02-12 ENCOUNTER — Emergency Department (HOSPITAL_COMMUNITY)
Admission: EM | Admit: 2014-02-12 | Discharge: 2014-02-12 | Disposition: A | Discharge status: Home / Self Care | Payer: Medicare Other | Attending: Emergency Medicine | Admitting: Emergency Medicine

## 2014-02-12 DIAGNOSIS — Z79899 Other long term (current) drug therapy: Secondary | ICD-10-CM | POA: Insufficient documentation

## 2014-02-12 DIAGNOSIS — K219 Gastro-esophageal reflux disease without esophagitis: Secondary | ICD-10-CM | POA: Insufficient documentation

## 2014-02-12 DIAGNOSIS — F411 Generalized anxiety disorder: Secondary | ICD-10-CM | POA: Insufficient documentation

## 2014-02-12 DIAGNOSIS — F3289 Other specified depressive episodes: Secondary | ICD-10-CM | POA: Insufficient documentation

## 2014-02-12 DIAGNOSIS — I10 Essential (primary) hypertension: Secondary | ICD-10-CM | POA: Diagnosis not present

## 2014-02-12 DIAGNOSIS — M549 Dorsalgia, unspecified: Secondary | ICD-10-CM | POA: Diagnosis not present

## 2014-02-12 DIAGNOSIS — M129 Arthropathy, unspecified: Secondary | ICD-10-CM | POA: Insufficient documentation

## 2014-02-12 DIAGNOSIS — F17200 Nicotine dependence, unspecified, uncomplicated: Secondary | ICD-10-CM | POA: Insufficient documentation

## 2014-02-12 DIAGNOSIS — J385 Laryngeal spasm: Secondary | ICD-10-CM | POA: Insufficient documentation

## 2014-02-12 DIAGNOSIS — R609 Edema, unspecified: Secondary | ICD-10-CM | POA: Diagnosis not present

## 2014-02-12 DIAGNOSIS — G8929 Other chronic pain: Secondary | ICD-10-CM | POA: Insufficient documentation

## 2014-02-12 DIAGNOSIS — J45909 Unspecified asthma, uncomplicated: Secondary | ICD-10-CM | POA: Insufficient documentation

## 2014-02-12 DIAGNOSIS — Z85038 Personal history of other malignant neoplasm of large intestine: Secondary | ICD-10-CM | POA: Diagnosis not present

## 2014-02-12 DIAGNOSIS — M545 Low back pain, unspecified: Secondary | ICD-10-CM | POA: Diagnosis not present

## 2014-02-12 DIAGNOSIS — R5383 Other fatigue: Secondary | ICD-10-CM

## 2014-02-12 DIAGNOSIS — J441 Chronic obstructive pulmonary disease with (acute) exacerbation: Secondary | ICD-10-CM | POA: Insufficient documentation

## 2014-02-12 DIAGNOSIS — M1990 Unspecified osteoarthritis, unspecified site: Secondary | ICD-10-CM | POA: Insufficient documentation

## 2014-02-12 DIAGNOSIS — IMO0002 Reserved for concepts with insufficient information to code with codable children: Secondary | ICD-10-CM | POA: Diagnosis not present

## 2014-02-12 DIAGNOSIS — R5381 Other malaise: Secondary | ICD-10-CM | POA: Insufficient documentation

## 2014-02-12 DIAGNOSIS — J45901 Unspecified asthma with (acute) exacerbation: Secondary | ICD-10-CM | POA: Diagnosis not present

## 2014-02-12 DIAGNOSIS — F329 Major depressive disorder, single episode, unspecified: Secondary | ICD-10-CM | POA: Insufficient documentation

## 2014-02-12 DIAGNOSIS — J40 Bronchitis, not specified as acute or chronic: Secondary | ICD-10-CM | POA: Diagnosis not present

## 2014-02-12 LAB — I-STAT CHEM 8, ED
BUN: 17 mg/dL (ref 6–23)
Calcium, Ion: 1 mmol/L — ABNORMAL LOW (ref 1.12–1.23)
Chloride: 101 mEq/L (ref 96–112)
Creatinine, Ser: 1 mg/dL (ref 0.50–1.10)
Glucose, Bld: 143 mg/dL — ABNORMAL HIGH (ref 70–99)
HCT: 44 % (ref 36.0–46.0)
Hemoglobin: 15 g/dL (ref 12.0–15.0)
POTASSIUM: 2.7 meq/L — AB (ref 3.7–5.3)
SODIUM: 138 meq/L (ref 137–147)
TCO2: 28 mmol/L (ref 0–100)

## 2014-02-12 LAB — CBC WITH DIFFERENTIAL/PLATELET
BASOS ABS: 0 10*3/uL (ref 0.0–0.1)
Basophils Relative: 0 % (ref 0–1)
Eosinophils Absolute: 0.1 10*3/uL (ref 0.0–0.7)
Eosinophils Relative: 1 % (ref 0–5)
HEMATOCRIT: 41 % (ref 36.0–46.0)
Hemoglobin: 13.2 g/dL (ref 12.0–15.0)
Lymphocytes Relative: 27 % (ref 12–46)
Lymphs Abs: 2.7 10*3/uL (ref 0.7–4.0)
MCH: 32 pg (ref 26.0–34.0)
MCHC: 32.2 g/dL (ref 30.0–36.0)
MCV: 99.5 fL (ref 78.0–100.0)
MONOS PCT: 8 % (ref 3–12)
Monocytes Absolute: 0.8 10*3/uL (ref 0.1–1.0)
NEUTROS ABS: 6.4 10*3/uL (ref 1.7–7.7)
Neutrophils Relative %: 64 % (ref 43–77)
Platelets: 158 10*3/uL (ref 150–400)
RBC: 4.12 MIL/uL (ref 3.87–5.11)
RDW: 14.2 % (ref 11.5–15.5)
WBC: 10 10*3/uL (ref 4.0–10.5)

## 2014-02-12 MED ORDER — ALBUTEROL SULFATE (2.5 MG/3ML) 0.083% IN NEBU
5.0000 mg | INHALATION_SOLUTION | Freq: Once | RESPIRATORY_TRACT | Status: AC
Start: 2014-02-12 — End: 2014-02-12
  Administered 2014-02-12: 5 mg via RESPIRATORY_TRACT
  Filled 2014-02-12: qty 6

## 2014-02-12 MED ORDER — POTASSIUM CHLORIDE CRYS ER 20 MEQ PO TBCR
40.0000 meq | EXTENDED_RELEASE_TABLET | Freq: Once | ORAL | Status: AC
  Administered 2014-02-12: 40 meq via ORAL
  Filled 2014-02-12: qty 2

## 2014-02-12 MED ORDER — POTASSIUM CHLORIDE CRYS ER 20 MEQ PO TBCR: 40.0000 meq | EXTENDED_RELEASE_TABLET | Freq: Two times a day (BID) | ORAL | Status: DC

## 2014-02-12 MED ORDER — HYDROMORPHONE HCL PF 1 MG/ML IJ SOLN
1.0000 mg | Freq: Once | INTRAMUSCULAR | Status: AC
  Administered 2014-02-12: 1 mg via INTRAMUSCULAR
  Filled 2014-02-12: qty 1

## 2014-02-12 MED ORDER — ALBUTEROL (5 MG/ML) CONTINUOUS INHALATION SOLN
10.0000 mg/h | INHALATION_SOLUTION | RESPIRATORY_TRACT | Status: DC
  Administered 2014-02-12: 10 mg/h via RESPIRATORY_TRACT
  Filled 2014-02-12: qty 20

## 2014-02-12 MED ORDER — IPRATROPIUM BROMIDE 0.02 % IN SOLN
0.5000 mg | Freq: Once | RESPIRATORY_TRACT | Status: AC
  Administered 2014-02-12: 0.5 mg via RESPIRATORY_TRACT
  Filled 2014-02-12: qty 2.5

## 2014-02-12 MED ORDER — PREDNISONE 20 MG PO TABS
60.0000 mg | ORAL_TABLET | Freq: Once | ORAL | Status: AC
  Administered 2014-02-12: 60 mg via ORAL
  Filled 2014-02-12: qty 3

## 2014-02-12 MED ORDER — ALBUTEROL SULFATE (2.5 MG/3ML) 0.083% IN NEBU
5.0000 mg | INHALATION_SOLUTION | Freq: Once | RESPIRATORY_TRACT | Status: AC
  Administered 2014-02-12: 2.5 mg via RESPIRATORY_TRACT
  Filled 2014-02-12: qty 6

## 2014-02-12 MED ORDER — NICOTINE 21 MG/24HR TD PT24
21.0000 mg | MEDICATED_PATCH | Freq: Every day | TRANSDERMAL | Status: DC
  Administered 2014-02-12: 21 mg via TRANSDERMAL
  Filled 2014-02-12: qty 1

## 2014-02-12 MED ORDER — PREDNISONE 10 MG PO TABS: 20.0000 mg | ORAL_TABLET | Freq: Every day | ORAL | Status: DC

## 2014-02-12 MED ORDER — OXYCODONE-ACETAMINOPHEN 5-325 MG PO TABS
2.0000 | ORAL_TABLET | Freq: Once | ORAL | Status: AC
  Administered 2014-02-12: 2 via ORAL
  Filled 2014-02-12: qty 2

## 2014-02-12 NOTE — ED Notes (Signed)
MD at bedside. EDP RAY IN TO SEE THIS PT

## 2014-02-12 NOTE — ED Notes (Signed)
BREATHING TX IN PROGRESS

## 2014-02-12 NOTE — ED Notes (Signed)
MD at bedside. 

## 2014-02-12 NOTE — ED Notes (Signed)
Pt came in with husband.  Both are being seen as patients.  Pt states she is here for SOB.  Audible expiratory wheezes.  Able to complete sentences w/o distress.  Pt falling asleep during triage.

## 2014-02-12 NOTE — Discharge Instructions (Signed)
You are not wheezing at this time but appear to have some upper airway noise.  Please take your usual asthma medication and the additional prednisone for your asthma.  Your potassium was low and you are being given potassium supplements.  Please have your potassium level rechecked on Tuesday.   Asthma Asthma is a recurring condition in which the airways tighten and narrow. Asthma can make it difficult to breathe. It can cause coughing, wheezing, and shortness of breath. Asthma episodes, also called asthma attacks, range from minor to life-threatening. Asthma cannot be cured, but medicines and lifestyle changes can help control it. CAUSES Asthma is believed to be caused by inherited (genetic) and environmental factors, but its exact cause is unknown. Asthma may be triggered by allergens, lung infections, or irritants in the air. Asthma triggers are different for each person. Common triggers include:   Animal dander.  Dust mites.  Cockroaches.  Pollen from trees or grass.  Mold.  Smoke.  Air pollutants such as dust, household cleaners, hair sprays, aerosol sprays, paint fumes, strong chemicals, or strong odors.  Cold air, weather changes, and winds (which increase molds and pollens in the air).  Strong emotional expressions such as crying or laughing hard.  Stress.  Certain medicines (such as aspirin) or types of drugs (such as beta-blockers).  Sulfites in foods and drinks. Foods and drinks that may contain sulfites include dried fruit, potato chips, and sparkling grape juice.  Infections or inflammatory conditions such as the flu, a cold, or an inflammation of the nasal membranes (rhinitis).  Gastroesophageal reflux disease (GERD).  Exercise or strenuous activity. SYMPTOMS Symptoms may occur immediately after asthma is triggered or many hours later. Symptoms include:  Wheezing.  Excessive nighttime or early morning coughing.  Frequent or severe coughing with a common  cold.  Chest tightness.  Shortness of breath. DIAGNOSIS  The diagnosis of asthma is made by a review of your medical history and a physical exam. Tests may also be performed. These may include:  Lung function studies. These tests show how much air you breathe in and out.  Allergy tests.  Imaging tests such as X-rays. TREATMENT  Asthma cannot be cured, but it can usually be controlled. Treatment involves identifying and avoiding your asthma triggers. It also involves medicines. There are 2 classes of medicine used for asthma treatment:   Controller medicines. These prevent asthma symptoms from occurring. They are usually taken every day.  Reliever or rescue medicines. These quickly relieve asthma symptoms. They are used as needed and provide short-term relief. Your health care provider will help you create an asthma action plan. An asthma action plan is a written plan for managing and treating your asthma attacks. It includes a list of your asthma triggers and how they may be avoided. It also includes information on when medicines should be taken and when their dosage should be changed. An action plan may also involve the use of a device called a peak flow meter. A peak flow meter measures how well the lungs are working. It helps you monitor your condition. HOME CARE INSTRUCTIONS   Take medicines only as directed by your health care provider. Speak with your health care provider if you have questions about how or when to take the medicines.  Use a peak flow meter as directed by your health care provider. Record and keep track of readings.  Understand and use the action plan to help minimize or stop an asthma attack without needing to seek medical  care.  Control your home environment in the following ways to help prevent asthma attacks:  Do not smoke. Avoid being exposed to secondhand smoke.  Change your heating and air conditioning filter regularly.  Limit your use of fireplaces and  wood stoves.  Get rid of pests (such as roaches and mice) and their droppings.  Throw away plants if you see mold on them.  Clean your floors and dust regularly. Use unscented cleaning products.  Try to have someone else vacuum for you regularly. Stay out of rooms while they are being vacuumed and for a short while afterward. If you vacuum, use a dust mask from a hardware store, a double-layered or microfilter vacuum cleaner bag, or a vacuum cleaner with a HEPA filter.  Replace carpet with wood, tile, or vinyl flooring. Carpet can trap dander and dust.  Use allergy-proof pillows, mattress covers, and box spring covers.  Wash bed sheets and blankets every week in hot water and dry them in a dryer.  Use blankets that are made of polyester or cotton.  Clean bathrooms and kitchens with bleach. If possible, have someone repaint the walls in these rooms with mold-resistant paint. Keep out of the rooms that are being cleaned and painted.  Wash hands frequently. SEEK MEDICAL CARE IF:   You have wheezing, shortness of breath, or a cough even if taking medicine to prevent attacks.  The colored mucus you cough up (sputum) is thicker than usual.  Your sputum changes from clear or white to yellow, green, gray, or bloody.  You have any problems that may be related to the medicines you are taking (such as a rash, itching, swelling, or trouble breathing).  You are using a reliever medicine more than 2-3 times per week.  Your peak flow is still at 50-79% of your personal best after following your action plan for 1 hour.  You have a fever. SEEK IMMEDIATE MEDICAL CARE IF:   You seem to be getting worse and are unresponsive to treatment during an asthma attack.  You are short of breath even at rest.  You get short of breath when doing very little physical activity.  You have difficulty eating, drinking, or talking due to asthma symptoms.  You develop chest pain.  You develop a fast  heartbeat.  You have a bluish color to your lips or fingernails.  You are light-headed, dizzy, or faint.  Your peak flow is less than 50% of your personal best. MAKE SURE YOU:   Understand these instructions.  Will watch your condition.  Will get help right away if you are not doing well or get worse. Document Released: 05/26/2005 Document Revised: 10/10/2013 Document Reviewed: 12/23/2012 [facility] Patient Information 2015 [place], [place]. This information is not intended to replace advice given to you by your health care provider. Make sure you discuss any questions you have with your health care provider.

## 2014-02-12 NOTE — ED Provider Notes (Addendum)
CSN: [id number]     Arrival date & time 02/12/14  1220 History   First MD Initiated Contact with Patient 02/12/14 1319     Chief Complaint  Patient presents with  . Asthma     (Consider location/radiation/quality/duration/timing/severity/associated sxs/prior Treatment) HPI Comments: Patient history of asthma presents with an asthma exacerbation. She states she's been having worsening wheezing and shortness of breath the last 2-3 days. She has a cough which is nonproductive. She does feel congested in her chest. She denies any fevers or chills. She also has some worsening lower back pain which she says is normally worse when her asthma layers out because of all the coughing. She's been using her albuterol inhaler at home but she says is almost out and it hasn't been effective. She has a nebulizer machine at home but did not have any of this solution.  Patient is a 58 y.o. female presenting with asthma.  Asthma Associated symptoms include shortness of breath. Pertinent negatives include no chest pain, no abdominal pain and no headaches.    Past Medical History  Diagnosis Date  . Hypertension   . Asthma   . COPD (chronic obstructive pulmonary disease)   . Anxiety   . Cancer   . Colon cancer   . Dyslipidemia   . Migraine headache   . Chronic bronchitis   . Uterine fibroid   . Ovarian cyst   . GERD (gastroesophageal reflux disease)   . Morbid obesity   . Obstructive sleep apnea     mild  . Chronic pain   . Chronic back pain   . Arthritis   . Osteoarthritis   . Depression    Past Surgical History  Procedure Laterality Date  . Knee arthroscopy      bil.  . Carpal tunnel release      rt  . Mouth surgery      teeth extraction  . Cesarean section    . Subtotal colectomy    . Colonoscopy    . Tubal ligation     Family History  Problem Relation Age of Onset  . Uterine cancer Mother   . Stroke Father   . Colon cancer Maternal Uncle   . Diabetes Father   . Ovarian cancer  Mother   . Hypertension Father   . Breast cancer Maternal Grandmother   . Diabetes Sister   . Asthma Child   . Asthma Child    History  Substance Use Topics  . Smoking status: Current Some Day Smoker -- 1.00 packs/day for 37 years    Types: Cigarettes  . Smokeless tobacco: Never Used     Comment: trying to quit, down to .5ppd X2 years ago.   . Alcohol Use: No   OB History   Grav Para Term Preterm Abortions TAB SAB Ect Mult Living   2 2 2       2      Review of Systems  Constitutional: Positive for fatigue. Negative for fever, chills and diaphoresis.  HENT: Negative for congestion, rhinorrhea and sneezing.   Eyes: Negative.   Respiratory: Positive for cough, shortness of breath and wheezing. Negative for chest tightness.   Cardiovascular: Negative for chest pain and leg swelling.  Gastrointestinal: Negative for nausea, vomiting, abdominal pain, diarrhea and blood in stool.  Genitourinary: Negative for frequency, hematuria, flank pain and difficulty urinating.  Musculoskeletal: Positive for back pain. Negative for arthralgias.  Skin: Negative for rash.  Neurological: Negative for dizziness, speech difficulty, weakness, numbness and  headaches.      Allergies  Kiwi extract and Aspirin  Home Medications   Prior to Admission medications   Medication Sig Start Date End Date Taking? Authorizing Provider  albuterol (PROVENTIL HFA;VENTOLIN HFA) 108 (90 BASE) MCG/ACT inhaler Inhale 2 puffs into the lungs every 6 (six) hours as needed for shortness of breath. For shortness of breeath 04/23/13  Yes Orson Eva, MD  fluticasone (FLOVENT HFA) 110 MCG/ACT inhaler Inhale 2 puffs into the lungs every 12 (twelve) hours. 01/17/14  Yes Lorayne Marek, MD  hydrochlorothiazide (HYDRODIURIL) 50 MG tablet Take 1 tablet (50 mg total) by mouth daily. 01/17/14  Yes Lorayne Marek, MD  ibuprofen (ADVIL,MOTRIN) 800 MG tablet Take 1 tablet by mouth 2 (two) times daily as needed. 08/04/13  Yes Historical  Provider, MD  LORazepam (ATIVAN) 1 MG tablet Take 1 tablet (1 mg total) by mouth every 6 (six) hours as needed for anxiety (anxiety). 01/17/14  Yes Lorayne Marek, MD  omeprazole (PRILOSEC) 40 MG capsule Take 1 capsule (40 mg total) by mouth daily. 01/17/14  Yes Lorayne Marek, MD  oxyCODONE-acetaminophen (PERCOCET) 5-325 MG per tablet Take 1-2 tablets by mouth every 4 (four) hours as needed. 01/10/14  Yes Hannah Muthersbaugh, PA-C  sertraline (ZOLOFT) 100 MG tablet Take 1 tablet (100 mg total) by mouth daily. 01/17/14  Yes Lorayne Marek, MD  traZODone (DESYREL) 50 MG tablet Take 1 tablet (50 mg total) by mouth at bedtime. 01/17/14  Yes Deepak Advani, MD   BP 132/71  Pulse 78  Temp(Src) 98 F (36.7 C) (Oral)  Resp 20  SpO2 95%  LMP 05/24/2003 Physical Exam  Constitutional: She is oriented to person, place, and time. She appears well-developed and well-nourished.  HENT:  Head: Normocephalic and atraumatic.  Eyes: Pupils are equal, round, and reactive to light.  Neck: Normal range of motion. Neck supple.  Cardiovascular: Normal rate, regular rhythm and normal heart sounds.   Pulmonary/Chest: Effort normal. No respiratory distress. She has wheezes. She has no rales. She exhibits no tenderness.  Diminished breath sounds bilaterally  Abdominal: Soft. Bowel sounds are normal. There is no tenderness. There is no rebound and no guarding.  Musculoskeletal: Normal range of motion. She exhibits edema.  No calf tenderness, mild bilateral pitting edema  Lymphadenopathy:    She has no cervical adenopathy.  Neurological: She is alert and oriented to person, place, and time.  Skin: Skin is warm and dry. No rash noted.  Psychiatric: She has a normal mood and affect.    ED Course  Procedures (including critical care time) Labs Review Labs Reviewed  CBC WITH DIFFERENTIAL  I-STAT CHEM 8, ED    Imaging Review Dg Chest 2 View  02/12/2014   CLINICAL DATA:  Cough and wheezing for 3 days. History of asthma,  hypertension, COPD and colon cancer. History of smoking.  EXAM: CHEST  2 VIEW  COMPARISON:  01/30/2014; 04/15/2012  FINDINGS: Grossly unchanged enlarged cardiac silhouette and mediastinal contours. Evaluation of the retrosternal clear space is obscured secondary to overlying soft tissues. The lungs are hyperexpanded with unchanged mild diffuse slightly nodular thickening of the pulmonary interstitium. No focal airspace opacities. No pleural effusion or pneumothorax. No evidence of edema. Surgical clips are seen within the left upper abdominal quadrant. Unchanged bones.  IMPRESSION: Mild lung hyperexpansion and bronchitic change without acute cardiopulmonary disease.   Electronically Signed   By: Sandi Mariscal M.D.   On: 02/12/2014 14:08     EKG Interpretation None  MDM   Final diagnoses:  Asthma with exacerbation, unspecified asthma severity    Patient has received 2 nebulizer treatments so far. She's also been given a dose of prednisone. She still fairly wheezy with tachypnea. I will go ahead and continue with nebulizer treatments. She may need admission if her symptoms are not improved. I also ordered lab work which is still pending.  Care turned over to Dr. Jeanell Sparrow.    Malvin Johns, MD 02/12/14 [place], MD 02/12/14 [place], MD 02/12/14 [id number]

## 2014-02-12 NOTE — ED Notes (Signed)
RRT AT BS 

## 2014-02-12 NOTE — ED Provider Notes (Signed)
58 year old female signed out to me to be reassessed after breathing treatment. She is also complaining of chronic low back pain. Patient reevaluated area and Lung exam good air movement throughout without expiratory wheezing. She does have upper airway sounds consistent with laryngospasm. Her oxygen saturations and respiratory rate and heart rate are normal. Patient is given 2 Percocet. She is given prescription for potassium and advised to have her potassium level be checked.  Shaune Pollack, MD 02/13/14 0000

## 2014-02-12 NOTE — ED Notes (Signed)
02 sats 91-93% with ambulating before 2nd breathing tx

## 2014-02-12 NOTE — ED Notes (Signed)
Patient transported to X-ray 

## 2014-02-12 NOTE — ED Notes (Signed)
Pt K level= 2.7, MD notified

## 2014-02-14 ENCOUNTER — Emergency Department (HOSPITAL_COMMUNITY)
Admission: EM | Admit: 2014-02-14 | Discharge: 2014-02-14 | Disposition: A | Discharge status: Home / Self Care | Payer: Medicare Other | Source: Home / Self Care | Attending: Emergency Medicine | Admitting: Emergency Medicine

## 2014-02-14 ENCOUNTER — Emergency Department (HOSPITAL_COMMUNITY): Payer: Medicare Other

## 2014-02-14 ENCOUNTER — Encounter (HOSPITAL_COMMUNITY): Payer: Self-pay | Admitting: Emergency Medicine

## 2014-02-14 DIAGNOSIS — Z859 Personal history of malignant neoplasm, unspecified: Secondary | ICD-10-CM

## 2014-02-14 DIAGNOSIS — G894 Chronic pain syndrome: Secondary | ICD-10-CM | POA: Diagnosis not present

## 2014-02-14 DIAGNOSIS — Z9221 Personal history of antineoplastic chemotherapy: Secondary | ICD-10-CM | POA: Diagnosis not present

## 2014-02-14 DIAGNOSIS — I8010 Phlebitis and thrombophlebitis of unspecified femoral vein: Secondary | ICD-10-CM | POA: Diagnosis not present

## 2014-02-14 DIAGNOSIS — M1990 Unspecified osteoarthritis, unspecified site: Secondary | ICD-10-CM | POA: Insufficient documentation

## 2014-02-14 DIAGNOSIS — I10 Essential (primary) hypertension: Secondary | ICD-10-CM | POA: Diagnosis not present

## 2014-02-14 DIAGNOSIS — J45901 Unspecified asthma with (acute) exacerbation: Secondary | ICD-10-CM

## 2014-02-14 DIAGNOSIS — F411 Generalized anxiety disorder: Secondary | ICD-10-CM

## 2014-02-14 DIAGNOSIS — Z6841 Body Mass Index (BMI) 40.0 and over, adult: Secondary | ICD-10-CM

## 2014-02-14 DIAGNOSIS — Z8742 Personal history of other diseases of the female genital tract: Secondary | ICD-10-CM | POA: Insufficient documentation

## 2014-02-14 DIAGNOSIS — J45909 Unspecified asthma, uncomplicated: Secondary | ICD-10-CM | POA: Diagnosis not present

## 2014-02-14 DIAGNOSIS — I82629 Acute embolism and thrombosis of deep veins of unspecified upper extremity: Secondary | ICD-10-CM | POA: Diagnosis not present

## 2014-02-14 DIAGNOSIS — I82819 Embolism and thrombosis of superficial veins of unspecified lower extremities: Secondary | ICD-10-CM

## 2014-02-14 DIAGNOSIS — I82409 Acute embolism and thrombosis of unspecified deep veins of unspecified lower extremity: Secondary | ICD-10-CM | POA: Diagnosis not present

## 2014-02-14 DIAGNOSIS — R0602 Shortness of breath: Secondary | ICD-10-CM

## 2014-02-14 DIAGNOSIS — IMO0002 Reserved for concepts with insufficient information to code with codable children: Secondary | ICD-10-CM | POA: Insufficient documentation

## 2014-02-14 DIAGNOSIS — Z85038 Personal history of other malignant neoplasm of large intestine: Secondary | ICD-10-CM

## 2014-02-14 DIAGNOSIS — Z79899 Other long term (current) drug therapy: Secondary | ICD-10-CM

## 2014-02-14 DIAGNOSIS — Z8669 Personal history of other diseases of the nervous system and sense organs: Secondary | ICD-10-CM | POA: Insufficient documentation

## 2014-02-14 DIAGNOSIS — R072 Precordial pain: Secondary | ICD-10-CM | POA: Diagnosis not present

## 2014-02-14 DIAGNOSIS — F17200 Nicotine dependence, unspecified, uncomplicated: Secondary | ICD-10-CM

## 2014-02-14 DIAGNOSIS — G4733 Obstructive sleep apnea (adult) (pediatric): Secondary | ICD-10-CM | POA: Diagnosis present

## 2014-02-14 DIAGNOSIS — F329 Major depressive disorder, single episode, unspecified: Secondary | ICD-10-CM

## 2014-02-14 DIAGNOSIS — I82811 Embolism and thrombosis of superficial veins of right lower extremities: Secondary | ICD-10-CM

## 2014-02-14 DIAGNOSIS — G8929 Other chronic pain: Secondary | ICD-10-CM

## 2014-02-14 DIAGNOSIS — I2699 Other pulmonary embolism without acute cor pulmonale: Secondary | ICD-10-CM | POA: Diagnosis not present

## 2014-02-14 DIAGNOSIS — J441 Chronic obstructive pulmonary disease with (acute) exacerbation: Secondary | ICD-10-CM

## 2014-02-14 DIAGNOSIS — M79609 Pain in unspecified limb: Secondary | ICD-10-CM | POA: Diagnosis not present

## 2014-02-14 DIAGNOSIS — K219 Gastro-esophageal reflux disease without esophagitis: Secondary | ICD-10-CM

## 2014-02-14 DIAGNOSIS — Z720 Tobacco use: Secondary | ICD-10-CM

## 2014-02-14 DIAGNOSIS — J45902 Unspecified asthma with status asthmaticus: Secondary | ICD-10-CM | POA: Diagnosis not present

## 2014-02-14 DIAGNOSIS — F3289 Other specified depressive episodes: Secondary | ICD-10-CM | POA: Diagnosis not present

## 2014-02-14 DIAGNOSIS — M7989 Other specified soft tissue disorders: Secondary | ICD-10-CM | POA: Diagnosis not present

## 2014-02-14 DIAGNOSIS — I824Y9 Acute embolism and thrombosis of unspecified deep veins of unspecified proximal lower extremity: Secondary | ICD-10-CM | POA: Diagnosis present

## 2014-02-14 LAB — COMPREHENSIVE METABOLIC PANEL
ALBUMIN: 3.3 g/dL — AB (ref 3.5–5.2)
ALT: 13 U/L (ref 0–35)
ANION GAP: 14 (ref 5–15)
AST: 16 U/L (ref 0–37)
Alkaline Phosphatase: 74 U/L (ref 39–117)
BUN: 19 mg/dL (ref 6–23)
CO2: 28 mEq/L (ref 19–32)
CREATININE: 1.03 mg/dL (ref 0.50–1.10)
Calcium: 9.2 mg/dL (ref 8.4–10.5)
Chloride: 99 mEq/L (ref 96–112)
GFR calc Af Amer: 69 mL/min — ABNORMAL LOW (ref >90)
GFR calc non Af Amer: 60 mL/min — ABNORMAL LOW (ref >90)
Glucose, Bld: 90 mg/dL (ref 70–99)
Potassium: 3.6 mEq/L — ABNORMAL LOW (ref 3.7–5.3)
Sodium: 141 mEq/L (ref 137–147)
TOTAL PROTEIN: 7.1 g/dL (ref 6.0–8.3)
Total Bilirubin: <0.2 mg/dL — ABNORMAL LOW (ref 0.3–1.2)

## 2014-02-14 LAB — CBC
HEMATOCRIT: 42 % (ref 36.0–46.0)
Hemoglobin: 13.8 g/dL (ref 12.0–15.0)
MCH: 32.7 pg (ref 26.0–34.0)
MCHC: 32.9 g/dL (ref 30.0–36.0)
MCV: 99.5 fL (ref 78.0–100.0)
Platelets: 159 10*3/uL (ref 150–400)
RBC: 4.22 MIL/uL (ref 3.87–5.11)
RDW: 14.5 % (ref 11.5–15.5)
WBC: 10 10*3/uL (ref 4.0–10.5)

## 2014-02-14 LAB — I-STAT TROPONIN, ED: TROPONIN I, POC: 0.01 ng/mL (ref 0.00–0.08)

## 2014-02-14 MED ORDER — OXYCODONE-ACETAMINOPHEN 5-325 MG PO TABS
2.0000 | ORAL_TABLET | Freq: Once | ORAL | Status: AC
  Administered 2014-02-14: 2 via ORAL
  Filled 2014-02-14: qty 2

## 2014-02-14 MED ORDER — OXYCODONE-ACETAMINOPHEN 5-325 MG PO TABS: 2.0000 | ORAL_TABLET | ORAL | Status: DC | PRN

## 2014-02-14 MED ORDER — ONDANSETRON 4 MG PO TBDP
4.0000 mg | ORAL_TABLET | Freq: Once | ORAL | Status: AC
  Administered 2014-02-14: 4 mg via ORAL
  Filled 2014-02-14: qty 1

## 2014-02-14 MED ORDER — HYDROMORPHONE HCL PF 2 MG/ML IJ SOLN
2.0000 mg | Freq: Once | INTRAMUSCULAR | Status: AC
Start: 2014-02-14 — End: 2014-02-14
  Administered 2014-02-14: 2 mg via INTRAMUSCULAR
  Filled 2014-02-14: qty 1

## 2014-02-14 MED ORDER — ALBUTEROL (5 MG/ML) CONTINUOUS INHALATION SOLN
10.0000 mg/h | INHALATION_SOLUTION | Freq: Once | RESPIRATORY_TRACT | Status: AC
  Administered 2014-02-14: 10 mg/h via RESPIRATORY_TRACT
  Filled 2014-02-14: qty 20

## 2014-02-14 MED ORDER — IPRATROPIUM-ALBUTEROL 0.5-2.5 (3) MG/3ML IN SOLN
3.0000 mL | Freq: Once | RESPIRATORY_TRACT | Status: AC
  Administered 2014-02-14: 3 mL via RESPIRATORY_TRACT
  Filled 2014-02-14: qty 3

## 2014-02-14 MED ORDER — METHYLPREDNISOLONE SODIUM SUCC 125 MG IJ SOLR
125.0000 mg | Freq: Once | INTRAMUSCULAR | Status: AC
  Administered 2014-02-14: 125 mg via INTRAMUSCULAR
  Filled 2014-02-14: qty 2

## 2014-02-14 NOTE — ED Notes (Signed)
Pt with inc wheezing insp/exp after nebulizer.

## 2014-02-14 NOTE — ED Notes (Signed)
Initial Contact - pt resting on stretcher, A+Ox4.  Pt reports c/o SOB with dry cough, pt also reports R inner thigh pain and swelling today.  Pt reports SOB has been for a few days but unable to control it at home with inhalers.  Speaking full/clear sentences, insp/exp wheezing noted throughout.  Sats upper 90s RA.  Pt denies cp/palpitations.  Bigeminy noted on monitor.  Skin PWD.  R thigh appears reddened and mildly swollen, but difficult to fully assess 2/2 body habitus.  Pt denies fevers/chills or history of similar. NAD.

## 2014-02-14 NOTE — Discharge Instructions (Signed)
Asthma Asthma is a recurring condition in which the airways tighten and narrow. Asthma can make it difficult to breathe. It can cause coughing, wheezing, and shortness of breath. Asthma episodes, also called asthma attacks, range from minor to life-threatening. Asthma cannot be cured, but medicines and lifestyle changes can help control it. CAUSES Asthma is believed to be caused by inherited (genetic) and environmental factors, but its exact cause is unknown. Asthma may be triggered by allergens, lung infections, or irritants in the air. Asthma triggers are different for each person. Common triggers include:   Animal dander.  Dust mites.  Cockroaches.  Pollen from trees or grass.  Mold.  Smoke.  Air pollutants such as dust, household cleaners, hair sprays, aerosol sprays, paint fumes, strong chemicals, or strong odors.  Cold air, weather changes, and winds (which increase molds and pollens in the air).  Strong emotional expressions such as crying or laughing hard.  Stress.  Certain medicines (such as aspirin) or types of drugs (such as beta-blockers).  Sulfites in foods and drinks. Foods and drinks that may contain sulfites include dried fruit, potato chips, and sparkling grape juice.  Infections or inflammatory conditions such as the flu, a cold, or an inflammation of the nasal membranes (rhinitis).  Gastroesophageal reflux disease (GERD).  Exercise or strenuous activity. SYMPTOMS Symptoms may occur immediately after asthma is triggered or many hours later. Symptoms include:  Wheezing.  Excessive nighttime or early morning coughing.  Frequent or severe coughing with a common cold.  Chest tightness.  Shortness of breath. DIAGNOSIS  The diagnosis of asthma is made by a review of your medical history and a physical exam. Tests may also be performed. These may include:  Lung function studies. These tests show how much air you breathe in and out.  Allergy  tests.  Imaging tests such as X-rays. TREATMENT  Asthma cannot be cured, but it can usually be controlled. Treatment involves identifying and avoiding your asthma triggers. It also involves medicines. There are 2 classes of medicine used for asthma treatment:   Controller medicines. These prevent asthma symptoms from occurring. They are usually taken every day.  Reliever or rescue medicines. These quickly relieve asthma symptoms. They are used as needed and provide short-term relief. Your health care provider will help you create an asthma action plan. An asthma action plan is a written plan for managing and treating your asthma attacks. It includes a list of your asthma triggers and how they may be avoided. It also includes information on when medicines should be taken and when their dosage should be changed. An action plan may also involve the use of a device called a peak flow meter. A peak flow meter measures how well the lungs are working. It helps you monitor your condition. HOME CARE INSTRUCTIONS   Take medicines only as directed by your health care provider. Speak with your health care provider if you have questions about how or when to take the medicines.  Use a peak flow meter as directed by your health care provider. Record and keep track of readings.  Understand and use the action plan to help minimize or stop an asthma attack without needing to seek medical care.  Control your home environment in the following ways to help prevent asthma attacks:  Do not smoke. Avoid being exposed to secondhand smoke.  Change your heating and air conditioning filter regularly.  Limit your use of fireplaces and wood stoves.  Get rid of pests (such as roaches and  mice) and their droppings.  Throw away plants if you see mold on them.  Clean your floors and dust regularly. Use unscented cleaning products.  Try to have someone else vacuum for you regularly. Stay out of rooms while they are  being vacuumed and for a short while afterward. If you vacuum, use a dust mask from a hardware store, a double-layered or microfilter vacuum cleaner bag, or a vacuum cleaner with a HEPA filter.  Replace carpet with wood, tile, or vinyl flooring. Carpet can trap dander and dust.  Use allergy-proof pillows, mattress covers, and box spring covers.  Wash bed sheets and blankets every week in hot water and dry them in a dryer.  Use blankets that are made of polyester or cotton.  Clean bathrooms and kitchens with bleach. If possible, have someone repaint the walls in these rooms with mold-resistant paint. Keep out of the rooms that are being cleaned and painted.  Wash hands frequently. SEEK MEDICAL CARE IF:   You have wheezing, shortness of breath, or a cough even if taking medicine to prevent attacks.  The colored mucus you cough up (sputum) is thicker than usual.  Your sputum changes from clear or white to yellow, green, gray, or bloody.  You have any problems that may be related to the medicines you are taking (such as a rash, itching, swelling, or trouble breathing).  You are using a reliever medicine more than 2-3 times per week.  Your peak flow is still at 50-79% of your personal best after following your action plan for 1 hour.  You have a fever. SEEK IMMEDIATE MEDICAL CARE IF:   You seem to be getting worse and are unresponsive to treatment during an asthma attack.  You are short of breath even at rest.  You get short of breath when doing very little physical activity.  You have difficulty eating, drinking, or talking due to asthma symptoms.  You develop chest pain.  You develop a fast heartbeat.  You have a bluish color to your lips or fingernails.  You are light-headed, dizzy, or faint.  Your peak flow is less than 50% of your personal best. MAKE SURE YOU:   Understand these instructions.  Will watch your condition.  Will get help right away if you are not  doing well or get worse. Document Released: 05/26/2005 Document Revised: 10/10/2013 Document Reviewed: 12/23/2012 [facility] Patient Information 2015 [place], [place]. This information is not intended to replace advice given to you by your health care provider. Make sure you discuss any questions you have with your health care provider. Phlebitis Phlebitis is soreness and swelling (inflammation) of a vein. This can occur in your arms, legs, or torso (trunk), as well as deeper inside your body. Phlebitis is usually not serious when it occurs close to the surface of the body. However, it can cause serious problems when it occurs in a vein deeper inside the body. CAUSES  Phlebitis can be triggered by various things, including:   Reduced blood flow through your veins. This can happen with:  Bed rest over a long period.  Long-distance travel.  Injury.  Surgery.  Being overweight (obese) or pregnant.  Having an IV tube put in the vein and getting certain medicines through the vein.  Cancer and cancer treatment.  Use of illegal drugs taken through the vein.  Inflammatory diseases.  Inherited (genetic) diseases that increase the risk of blood clots.  Hormone therapy, such as birth control pills. SIGNS AND SYMPTOMS  Red, tender, swollen, and painful area on your skin. Usually, the area will be long and narrow.  Firmness along the center of the affected area. This can indicate that a blood clot has formed.  Low-grade fever. DIAGNOSIS  A health care provider can usually diagnose phlebitis by examining the affected area and asking about your symptoms. To check for infection or blood clots, your health care provider may order blood tests or an ultrasound exam of the area. Blood tests and your family history may also indicate if you have an underlying genetic disease that causes blood clots. Occasionally, a piece of tissue is taken from the body (biopsy sample) if an unusual cause of phlebitis  is suspected. TREATMENT  Treatment will vary depending on the severity of the condition and the area of the body affected. Treatment may include:  Use of a warm compress or heating pad.  Use of compression stockings or bandages.  Anti-inflammatory medicines.  Removal of any IV tube that may be causing the problem.  Medicines that kill germs (antibiotics) if an infection is present.  Blood-thinning medicines if a blood clot is suspected or present.  In rare cases, surgery may be needed to remove damaged sections of vein. HOME CARE INSTRUCTIONS   Only take over-the-counter or prescription medicines as directed by your health care provider. Take all medicines exactly as prescribed.  Raise (elevate) the affected area above the level of your heart as directed by your health care provider.  Apply a warm compress or heating pad to the affected area as directed by your health care provider. Do not sleep with the heating pad.  Use compression stockings or bandages as directed. These will speed healing and prevent the condition from coming back.  If you are on blood thinners:  Get follow-up blood tests as directed by your health care provider.  Check with your health care provider before using any new medicines.  Carry a medical alert card or wear your medical alert jewelry to show that you are on blood thinners.  For phlebitis in the legs:  Avoid prolonged standing or bed rest.  Keep your legs moving. Raise your legs when sitting or lying.  Do not smoke.  Women, particularly those over the age of 2, should consider the risks and benefits of taking the contraceptive pill. This kind of hormone treatment can increase your risk for blood clots.  Follow up with your health care provider as directed. SEEK MEDICAL CARE IF:   You have unusual bruising or any bleeding problems.  Your swelling or pain in the affected area is not improving.  You are on anti-inflammatory medicine, and  you develop belly (abdominal) pain. SEEK IMMEDIATE MEDICAL CARE IF:   You have a sudden onset of chest pain or difficulty breathing.  You have a fever or persistent symptoms for more than 2-3 days.  You have a fever and your symptoms suddenly get worse. MAKE SURE YOU:  Understand these instructions.  Will watch your condition.  Will get help right away if you are not doing well or get worse. Document Released: 05/20/2001 Document Revised: 03/16/2013 Document Reviewed: 01/31/2013 [facility] Patient Information 2015 [place], [place]. This information is not intended to replace advice given to you by your health care provider. Make sure you discuss any questions you have with your health care provider.

## 2014-02-14 NOTE — ED Provider Notes (Signed)
Medical screening examination/treatment/procedure(s) were performed by non-physician practitioner and as supervising physician I was immediately available for consultation/collaboration.   EKG Interpretation None        Orpah Greek, MD 02/14/14 2040

## 2014-02-14 NOTE — ED Provider Notes (Signed)
CSN: [id number]     Arrival date & time 02/14/14  1528 History   First MD Initiated Contact with Patient 02/14/14 1642     Chief Complaint  Patient presents with  . Shortness of Breath     (Consider location/radiation/quality/duration/timing/severity/associated sxs/prior Treatment) Patient is a 58 y.o. female presenting with shortness of breath. The history is provided by the patient. No language interpreter was used.  Shortness of Breath Severity:  Moderate Onset quality:  Gradual Timing:  Constant Progression:  Worsening Chronicity:  New Relieved by:  Nothing Worsened by:  Nothing tried Ineffective treatments:  Inhaler Associated symptoms: cough   Risk factors: obesity   Risk factors: no hx of PE/DVT and no recent surgery   Pt seen here 2 days ago for asthma.   Pt on prednisone.  Pt reports wheezing today and pain to right leg.  Pt reports right leg is swollen and painful.    Past Medical History  Diagnosis Date  . Hypertension   . Asthma   . COPD (chronic obstructive pulmonary disease)   . Anxiety   . Cancer   . Colon cancer   . Dyslipidemia   . Migraine headache   . Chronic bronchitis   . Uterine fibroid   . Ovarian cyst   . GERD (gastroesophageal reflux disease)   . Morbid obesity   . Obstructive sleep apnea     mild  . Chronic pain   . Chronic back pain   . Arthritis   . Osteoarthritis   . Depression    Past Surgical History  Procedure Laterality Date  . Knee arthroscopy      bil.  . Carpal tunnel release      rt  . Mouth surgery      teeth extraction  . Cesarean section    . Subtotal colectomy    . Colonoscopy    . Tubal ligation     Family History  Problem Relation Age of Onset  . Uterine cancer Mother   . Stroke Father   . Colon cancer Maternal Uncle   . Diabetes Father   . Ovarian cancer Mother   . Hypertension Father   . Breast cancer Maternal Grandmother   . Diabetes Sister   . Asthma Child   . Asthma Child    History  Substance  Use Topics  . Smoking status: Current Some Day Smoker -- 1.00 packs/day for 37 years    Types: Cigarettes  . Smokeless tobacco: Never Used     Comment: trying to quit, down to .5ppd X2 years ago.   . Alcohol Use: No   OB History   Grav Para Term Preterm Abortions TAB SAB Ect Mult Living   2 2 2       2      Review of Systems  Respiratory: Positive for cough and shortness of breath.   All other systems reviewed and are negative.     Allergies  Kiwi extract and Aspirin  Home Medications   Prior to Admission medications   Medication Sig Start Date End Date Taking? Authorizing Provider  fluticasone (FLOVENT HFA) 110 MCG/ACT inhaler Inhale 2 puffs into the lungs every 12 (twelve) hours. 01/17/14  Yes Lorayne Marek, MD  hydrochlorothiazide (HYDRODIURIL) 50 MG tablet Take 1 tablet (50 mg total) by mouth daily. 01/17/14  Yes Lorayne Marek, MD  ibuprofen (ADVIL,MOTRIN) 800 MG tablet Take 1 tablet by mouth 2 (two) times daily as needed. 08/04/13  Yes Historical Provider, MD  LORazepam (ATIVAN)  1 MG tablet Take 1 tablet (1 mg total) by mouth every 6 (six) hours as needed for anxiety (anxiety). 01/17/14  Yes Lorayne Marek, MD  omeprazole (PRILOSEC) 40 MG capsule Take 1 capsule (40 mg total) by mouth daily. 01/17/14  Yes Lorayne Marek, MD  oxyCODONE-acetaminophen (PERCOCET/ROXICET) 5-325 MG per tablet Take 1-2 tablets by mouth every 4 (four) hours as needed for moderate pain or severe pain.   Yes Historical Provider, MD  potassium chloride SA (K-DUR,KLOR-CON) 20 MEQ tablet Take 2 tablets (40 mEq total) by mouth 2 (two) times daily. 02/12/14  Yes Shaune Pollack, MD  predniSONE (DELTASONE) 10 MG tablet Take 2 tablets (20 mg total) by mouth daily. 02/12/14  Yes Shaune Pollack, MD  sertraline (ZOLOFT) 100 MG tablet Take 1 tablet (100 mg total) by mouth daily. 01/17/14  Yes Lorayne Marek, MD  traZODone (DESYREL) 50 MG tablet Take 1 tablet (50 mg total) by mouth at bedtime. 01/17/14  Yes Lorayne Marek, MD   BP  111/75  Pulse 105  Temp(Src) 98.2 F (36.8 C) (Oral)  Resp 17  Ht 4\' 11"  (1.499 m)  Wt 270 lb (122.471 kg)  BMI 54.50 kg/m2  SpO2 100%  LMP 05/24/2003 Physical Exam  Nursing note and vitals reviewed. Constitutional: She appears well-developed and well-nourished.  HENT:  Head: Normocephalic.  Eyes: Pupils are equal, round, and reactive to light.  Neck: Normal range of motion.  Cardiovascular: Normal rate and normal heart sounds.   Pulmonary/Chest: She has wheezes.  Abdominal: Soft.  Musculoskeletal: She exhibits tenderness.  Swollen right leg,  Upper anterior thigh is painful,  Calf nontender  nv and ns intact  Neurological: She is alert.  Skin: Skin is warm.  Psychiatric: She has a normal mood and affect.    ED Course  Procedures (including critical care time) Labs Review Labs Reviewed  COMPREHENSIVE METABOLIC PANEL - Abnormal; Notable for the following:    Potassium 3.6 (*)    Albumin 3.3 (*)    Total Bilirubin <0.2 (*)    GFR calc non Af Amer 60 (*)    GFR calc Af Amer 69 (*)    All other components within normal limits  CBC  I-STAT TROPOININ, ED    Imaging Review Dg Chest Portable 1 View  02/14/2014   CLINICAL DATA:  Severe shortness of breath. History of asthma, COPD, colon cancer and hypertension  EXAM: PORTABLE CHEST - 1 VIEW  COMPARISON:  02/12/2014; 01/10/2014; 04/20/2013  FINDINGS: Examination is degraded due to patient body habitus and portable technique.  Grossly unchanged enlarged cardiac silhouette and mediastinal contours. There is grossly unchanged mild diffuse slightly nodular thickening of the pulmonary interstitium. Veiling opacities overlying the bilateral lower lungs are favored to represent overlying breast tissues. No discrete focal airspace opacities. No pleural effusion or pneumothorax. No evidence of edema. No acute osseus abnormalities. At least moderate degenerative change of the bilateral glenohumeral joints is suspected though incompletely  evaluated.  IMPRESSION: Findings suggestive of airways disease / bronchitis. No focal airspace opacities to suggest pneumonia on this AP portable examination.   Electronically Signed   By: Sandi Mariscal M.D.   On: 02/14/2014 16:50     EKG Interpretation None      MDM Pt given 1 hour continuous neb treatment and is breathing better,   Doppler US shows superficial thrombosis  No deep vein thrombosis.  Pt given solumedrol IM.   Pt had some pain relief with injection but pain returned Pt advised to continue  prednisone. Rx for Percocet,  Warm compresses,  Follow up with primary MD for recheck   Final diagnoses:  Acute superficial venous thrombosis of lower extremity, right  Asthma attack    Continue prednisone Percocet Warm compresses    Fransico Meadow, PA-C 02/14/14 2031

## 2014-02-14 NOTE — ED Notes (Signed)
RT at bedside to initiate continuous UD.

## 2014-02-14 NOTE — ED Notes (Signed)
Pt ambulatory with steady gait to void in BR.  

## 2014-02-14 NOTE — ED Notes (Signed)
Pt requesting pain medications.  EDPA notified.

## 2014-02-14 NOTE — Progress Notes (Signed)
Bilateral lower extremity venous duplex completed.  Right:  No obvious evidence of DVT or Baker's cyst.  There appears to be superficial thrombus in the right greater saphenous vein.  Left:  No evidence of DVT, superficial thrombosis, or Baker's cyst.  Technically difficult study due to the patient's body habitus.

## 2014-02-14 NOTE — ED Notes (Signed)
Pt sts that she is having SOB, CP and her R upper leg has some swelling. Pt sts she has been here recently with the same complaints. Pt sts nothing has improved since her last visit.

## 2014-02-16 ENCOUNTER — Emergency Department (HOSPITAL_COMMUNITY): Payer: Medicare Other

## 2014-02-16 ENCOUNTER — Inpatient Hospital Stay (HOSPITAL_COMMUNITY)
Admission: EM | Admit: 2014-02-16 | Discharge: 2014-02-20 | DRG: 300 | Disposition: A | Discharge status: Home / Self Care | Payer: Medicare Other | Attending: Family Medicine | Admitting: Family Medicine

## 2014-02-16 ENCOUNTER — Encounter (HOSPITAL_COMMUNITY): Payer: Self-pay | Admitting: Emergency Medicine

## 2014-02-16 ENCOUNTER — Telehealth: Payer: Self-pay | Admitting: Internal Medicine

## 2014-02-16 DIAGNOSIS — J45909 Unspecified asthma, uncomplicated: Secondary | ICD-10-CM | POA: Diagnosis present

## 2014-02-16 DIAGNOSIS — G894 Chronic pain syndrome: Secondary | ICD-10-CM

## 2014-02-16 DIAGNOSIS — J441 Chronic obstructive pulmonary disease with (acute) exacerbation: Secondary | ICD-10-CM | POA: Diagnosis present

## 2014-02-16 DIAGNOSIS — R072 Precordial pain: Secondary | ICD-10-CM

## 2014-02-16 DIAGNOSIS — G4733 Obstructive sleep apnea (adult) (pediatric): Secondary | ICD-10-CM | POA: Diagnosis present

## 2014-02-16 DIAGNOSIS — I10 Essential (primary) hypertension: Secondary | ICD-10-CM | POA: Diagnosis not present

## 2014-02-16 DIAGNOSIS — M7989 Other specified soft tissue disorders: Secondary | ICD-10-CM

## 2014-02-16 DIAGNOSIS — F17200 Nicotine dependence, unspecified, uncomplicated: Secondary | ICD-10-CM | POA: Diagnosis present

## 2014-02-16 DIAGNOSIS — Z6841 Body Mass Index (BMI) 40.0 and over, adult: Secondary | ICD-10-CM | POA: Diagnosis not present

## 2014-02-16 DIAGNOSIS — F329 Major depressive disorder, single episode, unspecified: Secondary | ICD-10-CM

## 2014-02-16 DIAGNOSIS — F32A Depression, unspecified: Secondary | ICD-10-CM

## 2014-02-16 DIAGNOSIS — I82409 Acute embolism and thrombosis of unspecified deep veins of unspecified lower extremity: Secondary | ICD-10-CM

## 2014-02-16 DIAGNOSIS — J4531 Mild persistent asthma with (acute) exacerbation: Secondary | ICD-10-CM

## 2014-02-16 DIAGNOSIS — I8010 Phlebitis and thrombophlebitis of unspecified femoral vein: Secondary | ICD-10-CM

## 2014-02-16 DIAGNOSIS — I82411 Acute embolism and thrombosis of right femoral vein: Secondary | ICD-10-CM

## 2014-02-16 DIAGNOSIS — I2699 Other pulmonary embolism without acute cor pulmonale: Secondary | ICD-10-CM | POA: Diagnosis not present

## 2014-02-16 DIAGNOSIS — I82401 Acute embolism and thrombosis of unspecified deep veins of right lower extremity: Secondary | ICD-10-CM

## 2014-02-16 DIAGNOSIS — Z79899 Other long term (current) drug therapy: Secondary | ICD-10-CM | POA: Diagnosis not present

## 2014-02-16 DIAGNOSIS — F3289 Other specified depressive episodes: Secondary | ICD-10-CM | POA: Diagnosis not present

## 2014-02-16 DIAGNOSIS — J45901 Unspecified asthma with (acute) exacerbation: Secondary | ICD-10-CM | POA: Diagnosis present

## 2014-02-16 DIAGNOSIS — Z85038 Personal history of other malignant neoplasm of large intestine: Secondary | ICD-10-CM | POA: Diagnosis not present

## 2014-02-16 DIAGNOSIS — I82629 Acute embolism and thrombosis of deep veins of unspecified upper extremity: Secondary | ICD-10-CM

## 2014-02-16 DIAGNOSIS — I824Y9 Acute embolism and thrombosis of unspecified deep veins of unspecified proximal lower extremity: Secondary | ICD-10-CM | POA: Diagnosis not present

## 2014-02-16 DIAGNOSIS — M79609 Pain in unspecified limb: Secondary | ICD-10-CM | POA: Diagnosis not present

## 2014-02-16 DIAGNOSIS — Z9221 Personal history of antineoplastic chemotherapy: Secondary | ICD-10-CM | POA: Diagnosis not present

## 2014-02-16 DIAGNOSIS — I82419 Acute embolism and thrombosis of unspecified femoral vein: Secondary | ICD-10-CM | POA: Diagnosis present

## 2014-02-16 LAB — CBC WITH DIFFERENTIAL/PLATELET
BASOS ABS: 0 10*3/uL (ref 0.0–0.1)
Basophils Relative: 0 % (ref 0–1)
Eosinophils Absolute: 0.1 10*3/uL (ref 0.0–0.7)
Eosinophils Relative: 1 % (ref 0–5)
HEMATOCRIT: 44.6 % (ref 36.0–46.0)
HEMOGLOBIN: 14.7 g/dL (ref 12.0–15.0)
LYMPHS ABS: 3.2 10*3/uL (ref 0.7–4.0)
LYMPHS PCT: 28 % (ref 12–46)
MCH: 32.6 pg (ref 26.0–34.0)
MCHC: 33 g/dL (ref 30.0–36.0)
MCV: 98.9 fL (ref 78.0–100.0)
Monocytes Absolute: 0.9 10*3/uL (ref 0.1–1.0)
Monocytes Relative: 8 % (ref 3–12)
NEUTROS ABS: 7.1 10*3/uL (ref 1.7–7.7)
Neutrophils Relative %: 63 % (ref 43–77)
PLATELETS: 137 10*3/uL — AB (ref 150–400)
RBC: 4.51 MIL/uL (ref 3.87–5.11)
RDW: 14.3 % (ref 11.5–15.5)
WBC: 11.2 10*3/uL — ABNORMAL HIGH (ref 4.0–10.5)

## 2014-02-16 LAB — BASIC METABOLIC PANEL
Anion gap: 17 — ABNORMAL HIGH (ref 5–15)
BUN: 24 mg/dL — AB (ref 6–23)
CO2: 26 mEq/L (ref 19–32)
Calcium: 9.6 mg/dL (ref 8.4–10.5)
Chloride: 96 mEq/L (ref 96–112)
Creatinine, Ser: 1.1 mg/dL (ref 0.50–1.10)
GFR, EST AFRICAN AMERICAN: 64 mL/min — AB (ref >90)
GFR, EST NON AFRICAN AMERICAN: 55 mL/min — AB (ref >90)
Glucose, Bld: 99 mg/dL (ref 70–99)
POTASSIUM: 3.8 meq/L (ref 3.7–5.3)
Sodium: 139 mEq/L (ref 137–147)

## 2014-02-16 LAB — APTT: aPTT: 22 seconds — ABNORMAL LOW (ref 24–37)

## 2014-02-16 LAB — PROTIME-INR
INR: 0.99 (ref 0.00–1.49)
Prothrombin Time: 13.1 seconds (ref 11.6–15.2)

## 2014-02-16 LAB — TROPONIN I

## 2014-02-16 MED ORDER — LORAZEPAM 1 MG PO TABS
1.0000 mg | ORAL_TABLET | Freq: Four times a day (QID) | ORAL | Status: DC | PRN
  Administered 2014-02-17: 1 mg via ORAL
  Filled 2014-02-16: qty 1

## 2014-02-16 MED ORDER — MORPHINE SULFATE 2 MG/ML IJ SOLN
1.0000 mg | INTRAMUSCULAR | Status: DC | PRN
  Administered 2014-02-17 (×2): 1 mg via INTRAVENOUS
  Filled 2014-02-16 (×2): qty 1

## 2014-02-16 MED ORDER — OXYCODONE-ACETAMINOPHEN 5-325 MG PO TABS
1.0000 | ORAL_TABLET | ORAL | Status: DC | PRN
  Administered 2014-02-16: 2 via ORAL
  Filled 2014-02-16: qty 1
  Filled 2014-02-16: qty 2

## 2014-02-16 MED ORDER — MORPHINE SULFATE 4 MG/ML IJ SOLN
4.0000 mg | Freq: Once | INTRAMUSCULAR | Status: AC
  Administered 2014-02-16: 4 mg via INTRAVENOUS
  Filled 2014-02-16: qty 1

## 2014-02-16 MED ORDER — SODIUM CHLORIDE 0.9 % IV SOLN
INTRAVENOUS | Status: DC
  Administered 2014-02-16 – 2014-02-19 (×4): via INTRAVENOUS

## 2014-02-16 MED ORDER — SERTRALINE HCL 100 MG PO TABS
100.0000 mg | ORAL_TABLET | Freq: Every day | ORAL | Status: DC
  Administered 2014-02-17 – 2014-02-20 (×4): 100 mg via ORAL
  Filled 2014-02-16 (×4): qty 1

## 2014-02-16 MED ORDER — HEPARIN (PORCINE) IN NACL 100-0.45 UNIT/ML-% IJ SOLN
1050.0000 [IU]/h | INTRAMUSCULAR | Status: DC
  Administered 2014-02-16: 1050 [IU]/h via INTRAVENOUS
  Filled 2014-02-16 (×2): qty 250

## 2014-02-16 MED ORDER — PREDNISONE 50 MG PO TABS
60.0000 mg | ORAL_TABLET | Freq: Every day | ORAL | Status: DC
  Administered 2014-02-17 – 2014-02-18 (×2): 60 mg via ORAL
  Filled 2014-02-16 (×3): qty 1

## 2014-02-16 MED ORDER — ALBUTEROL SULFATE (2.5 MG/3ML) 0.083% IN NEBU: 2.5000 mg | INHALATION_SOLUTION | RESPIRATORY_TRACT | Status: DC | PRN

## 2014-02-16 MED ORDER — OXYCODONE-ACETAMINOPHEN 5-325 MG PO TABS
2.0000 | ORAL_TABLET | Freq: Once | ORAL | Status: AC
  Administered 2014-02-16: 2 via ORAL
  Filled 2014-02-16: qty 2

## 2014-02-16 MED ORDER — FLUTICASONE PROPIONATE HFA 110 MCG/ACT IN AERO
2.0000 | INHALATION_SPRAY | Freq: Two times a day (BID) | RESPIRATORY_TRACT | Status: DC
  Administered 2014-02-16 – 2014-02-20 (×8): 2 via RESPIRATORY_TRACT
  Filled 2014-02-16: qty 12

## 2014-02-16 MED ORDER — ALBUTEROL SULFATE (2.5 MG/3ML) 0.083% IN NEBU
2.5000 mg | INHALATION_SOLUTION | Freq: Four times a day (QID) | RESPIRATORY_TRACT | Status: DC
  Administered 2014-02-16: 2.5 mg via RESPIRATORY_TRACT
  Filled 2014-02-16: qty 3

## 2014-02-16 MED ORDER — ONDANSETRON HCL 4 MG PO TABS
4.0000 mg | ORAL_TABLET | Freq: Four times a day (QID) | ORAL | Status: DC | PRN
  Administered 2014-02-19: 4 mg via ORAL
  Filled 2014-02-16: qty 1

## 2014-02-16 MED ORDER — ALBUTEROL SULFATE (2.5 MG/3ML) 0.083% IN NEBU: 2.5000 mg | INHALATION_SOLUTION | Freq: Four times a day (QID) | RESPIRATORY_TRACT | Status: DC | PRN

## 2014-02-16 MED ORDER — HEPARIN (PORCINE) IN NACL 100-0.45 UNIT/ML-% IJ SOLN: 16.0000 [IU]/kg/h | INTRAMUSCULAR | Status: DC

## 2014-02-16 MED ORDER — IOHEXOL 350 MG/ML SOLN: 100.0000 mL | Freq: Once | INTRAVENOUS | Status: AC | PRN

## 2014-02-16 MED ORDER — ONDANSETRON HCL 4 MG/2ML IJ SOLN: 4.0000 mg | Freq: Four times a day (QID) | INTRAMUSCULAR | Status: DC | PRN

## 2014-02-16 MED ORDER — IPRATROPIUM-ALBUTEROL 0.5-2.5 (3) MG/3ML IN SOLN
3.0000 mL | Freq: Three times a day (TID) | RESPIRATORY_TRACT | Status: DC
  Administered 2014-02-17: 3 mL via RESPIRATORY_TRACT
  Filled 2014-02-16: qty 3

## 2014-02-16 MED ORDER — KETOROLAC TROMETHAMINE 60 MG/2ML IM SOLN
60.0000 mg | Freq: Once | INTRAMUSCULAR | Status: AC
  Administered 2014-02-16: 60 mg via INTRAMUSCULAR
  Filled 2014-02-16: qty 2

## 2014-02-16 MED ORDER — HEPARIN BOLUS VIA INFUSION
1700.0000 [IU] | Freq: Once | INTRAVENOUS | Status: AC
  Administered 2014-02-16: 1700 [IU] via INTRAVENOUS
  Filled 2014-02-16: qty 1700

## 2014-02-16 MED ORDER — TRAZODONE HCL 50 MG PO TABS
50.0000 mg | ORAL_TABLET | Freq: Every day | ORAL | Status: DC
  Administered 2014-02-16 – 2014-02-19 (×4): 50 mg via ORAL
  Filled 2014-02-16 (×5): qty 1

## 2014-02-16 MED ORDER — PANTOPRAZOLE SODIUM 40 MG PO TBEC
40.0000 mg | DELAYED_RELEASE_TABLET | Freq: Every day | ORAL | Status: DC
  Administered 2014-02-17 – 2014-02-20 (×4): 40 mg via ORAL
  Filled 2014-02-16 (×4): qty 1

## 2014-02-16 MED ORDER — SODIUM CHLORIDE 0.9 % IJ SOLN: 3.0000 mL | Freq: Two times a day (BID) | INTRAMUSCULAR | Status: DC

## 2014-02-16 MED ORDER — IPRATROPIUM BROMIDE 0.02 % IN SOLN
0.5000 mg | Freq: Four times a day (QID) | RESPIRATORY_TRACT | Status: DC
  Administered 2014-02-16: 0.5 mg via RESPIRATORY_TRACT
  Filled 2014-02-16: qty 2.5

## 2014-02-16 MED ORDER — NICOTINE 14 MG/24HR TD PT24
14.0000 mg | MEDICATED_PATCH | Freq: Every day | TRANSDERMAL | Status: DC
  Administered 2014-02-16: 14 mg via TRANSDERMAL
  Filled 2014-02-16 (×2): qty 1

## 2014-02-16 NOTE — ED Notes (Addendum)
EKG given to EDP Pfeiffer for review

## 2014-02-16 NOTE — Progress Notes (Signed)
ANTICOAGULATION CONSULT NOTE - Initial Consult  Pharmacy Consult for Heparin Infusion Indication: DVT  Allergies  Allergen Reactions  . Kiwi Extract Hives and Swelling  . Aspirin Nausea Only    Can take as long as enteric coated    Patient Measurements:   Heparin Dosing Weight: 74kg  Vital Signs: Temp: 98.8 F (37.1 C) (09/10 1526) Temp src: Oral (09/10 1526) BP: 131/77 mmHg (09/10 1843) Pulse Rate: 70 (09/10 1843)  Labs:  Recent Labs  02/14/14 1554  HGB 13.8  HCT 42.0  PLT 159  CREATININE 1.03    The CrCl is unknown because both a height and weight (above a minimum accepted value) are required for this calculation.   Medical History: Past Medical History  Diagnosis Date  . Hypertension   . Asthma   . COPD (chronic obstructive pulmonary disease)   . Anxiety   . Cancer   . Colon cancer   . Dyslipidemia   . Migraine headache   . Chronic bronchitis   . Uterine fibroid   . Ovarian cyst   . GERD (gastroesophageal reflux disease)   . Morbid obesity   . Obstructive sleep apnea     mild  . Chronic pain   . Chronic back pain   . Arthritis   . Osteoarthritis   . Depression     Assessment: 58yo F in ED with leg swelling. Pharmacy consulted to dose heparin drip for possible DVT.  CBC: Hgb, plts WNL  Goal of Therapy:  Heparin level 0.3-0.7 units/ml Monitor platelets by anticoagulation protocol: Yes   Plan:  Give Heparin bolus 1700 units Start Heparin infusion @ 1050 units/hr Obtain heparin level 6 hours after start of infusion Daily CBC  Kizzie Furnish, PharmD Pager: [phone] 02/16/2014 8:09 PM

## 2014-02-16 NOTE — ED Notes (Signed)
Pt c/o right leg swelling and pain, states she was seen on the 8th states they said it was a blood clot. Pt states all she got was pain meds and sent home. Denies SOB

## 2014-02-16 NOTE — Progress Notes (Signed)
*  Preliminary Results* Right lower extremity venous duplex completed. Right lower extremity is positive for extensive acute occlusive deep vein thrombosis involving the right saphenofemoral junction, common femoral, profunda femoral, femoral, popliteal, posterior tibial, and peroneal veins. There is no evidence of right Baker's cyst.  Preliminary results discussed with Dr.Pfeiffer.  02/16/2014 8:04 PM  Maudry Mayhew, RVT, RDCS, RDMS

## 2014-02-16 NOTE — H&P (Signed)
[facility] History and Physical  Brooke [name] WUJ:[id number] DOB: 1956/05/22 DOA: 02/16/2014  Referring physician: Dr Johnney Killian PCP: Lorayne Marek, MD   Chief Complaint: Right leg worsening pain and swelling.   HPI: Brooke [name] is a 58 y.o. female with PMH significant for colon cancer S/P surgery and chemo, last saw oncologist 1 year ago, history of asthma, current smoker who present to Ed complaining of worsening right lower extremity pain and swelling. She was seen in the ED on the 8 th for legg pain. She was diagnosed with superficial thrombus in the right greater saphenous vein. She was also seen in the ED on Sunday 5 th for SOB. She was prescribe prednisone taper. She relates chest pain on and off since the 8 th, intermittent, She is still complaining of SOB. She relates dry cough. She denies nausea, vomiting. She has multiple BM per day since she had surgery for colon cancer. Patient relates numbness and  decrease sensation on right LE.   Evaluation in the ED: Doppler showed: Positive extensive acute occlusive deep vein thrombosis involving the right saphenofemoral junction, common femoral, profunda femoral, femoral, popliteal, posterior tibial, and peroneal veins. Patient was started on Heparin Gtt.   Review of Systems:  Negative, except as per HPI.   Past Medical History  Diagnosis Date  . Hypertension   . Asthma   . COPD (chronic obstructive pulmonary disease)   . Anxiety   . Cancer   . Colon cancer   . Dyslipidemia   . Migraine headache   . Chronic bronchitis   . Uterine fibroid   . Ovarian cyst   . GERD (gastroesophageal reflux disease)   . Morbid obesity   . Obstructive sleep apnea     mild  . Chronic pain   . Chronic back pain   . Arthritis   . Osteoarthritis   . Depression    Past Surgical History  Procedure Laterality Date  . Knee arthroscopy      bil.  . Carpal tunnel release      rt  . Mouth surgery      teeth extraction  . Cesarean section     . Subtotal colectomy    . Colonoscopy    . Tubal ligation     Social History:  reports that she has been smoking Cigarettes.  She has a 37 pack-year smoking history. She has never used smokeless tobacco. She reports that she does not drink alcohol or use illicit drugs.  Allergies  Allergen Reactions  . Kiwi Extract Hives and Swelling  . Aspirin Nausea Only    Can take as long as enteric coated    Family History  Problem Relation Age of Onset  . Uterine cancer Mother   . Stroke Father   . Colon cancer Maternal Uncle   . Diabetes Father   . Ovarian cancer Mother   . Hypertension Father   . Breast cancer Maternal Grandmother   . Diabetes Sister   . Asthma Child   . Asthma Child      Prior to Admission medications   Medication Sig Start Date End Date Taking? Authorizing Provider  fluticasone (FLOVENT HFA) 110 MCG/ACT inhaler Inhale 2 puffs into the lungs every 12 (twelve) hours. 01/17/14  Yes Lorayne Marek, MD  hydrochlorothiazide (HYDRODIURIL) 50 MG tablet Take 1 tablet (50 mg total) by mouth daily. 01/17/14  Yes Lorayne Marek, MD  ibuprofen (ADVIL,MOTRIN) 800 MG tablet Take 1 tablet by mouth 2 (two) times daily as needed for  moderate pain.  08/04/13  Yes Historical Provider, MD  LORazepam (ATIVAN) 1 MG tablet Take 1 tablet (1 mg total) by mouth every 6 (six) hours as needed for anxiety (anxiety). 01/17/14  Yes Lorayne Marek, MD  omeprazole (PRILOSEC) 40 MG capsule Take 1 capsule (40 mg total) by mouth daily. 01/17/14  Yes Lorayne Marek, MD  oxyCODONE-acetaminophen (PERCOCET/ROXICET) 5-325 MG per tablet Take 1-2 tablets by mouth every 4 (four) hours as needed for moderate pain or severe pain.   Yes Historical Provider, MD  potassium chloride SA (K-DUR,KLOR-CON) 20 MEQ tablet Take 2 tablets (40 mEq total) by mouth 2 (two) times daily. 02/12/14  Yes Shaune Pollack, MD  predniSONE (DELTASONE) 10 MG tablet Take 2 tablets (20 mg total) by mouth daily. 02/12/14  Yes Shaune Pollack, MD    sertraline (ZOLOFT) 100 MG tablet Take 1 tablet (100 mg total) by mouth daily. 01/17/14  Yes Lorayne Marek, MD  traZODone (DESYREL) 50 MG tablet Take 1 tablet (50 mg total) by mouth at bedtime. 01/17/14   Lorayne Marek, MD   Physical Exam: Filed Vitals:   02/16/14 1526 02/16/14 1843 02/16/14 2020  BP: 131/54 131/77 107/56  Pulse: 67 70 67  Temp: 98.8 F (37.1 C)  98.3 F (36.8 C)  TempSrc: Oral  Oral  Resp: 20 18 18   SpO2: 99% 95% 97%    Wt Readings from Last 3 Encounters:  02/14/14 122.471 kg (270 lb)  02/08/14 124.286 kg (274 lb)  01/17/14 123.832 kg (273 lb)    General:  Appears calm and comfortable Eyes: PERRL, normal lids, irises & conjunctiva ENT: grossly normal hearing, lips & tongue Neck: no LAD, masses or thyromegaly Cardiovascular: RRR, no m/r/g.  Telemetry: SR, no arrhythmias  Respiratory:  no /r/r. Normal respiratory effort. Bilateral wheezing.  Abdomen: soft, ntnd Skin: no rash or induration seen on limited exam Musculoskeletal: grossly normal tone BUE/BLE. Right LE with very significant swelling, firm,  warm, no cyanosis, pulse present.  Psychiatric: grossly normal mood and affect, speech fluent and appropriate Neurologic: grossly non-focal.          Labs on Admission:  Basic Metabolic Panel:  Recent Labs Lab 02/12/14 1636 02/14/14 1554 02/16/14 2024  NA 138 141 139  K 2.7* 3.6* 3.8  CL 101 99 96  CO2  --  28 26  GLUCOSE 143* 90 99  BUN 17 19 24*  CREATININE 1.00 1.03 1.10  CALCIUM  --  9.2 9.6   Liver Function Tests:  Recent Labs Lab 02/14/14 1554  AST 16  ALT 13  ALKPHOS 74  BILITOT <0.2*  PROT 7.1  ALBUMIN 3.3*   No results found for this basename: LIPASE, AMYLASE,  in the last 168 hours No results found for this basename: AMMONIA,  in the last 168 hours CBC:  Recent Labs Lab 02/12/14 1625 02/12/14 1636 02/14/14 1554 02/16/14 2024  WBC 10.0  --  10.0 11.2*  NEUTROABS 6.4  --   --  7.1  HGB 13.2 15.0 13.8 14.7  HCT 41.0  44.0 42.0 44.6  MCV 99.5  --  99.5 98.9  PLT 158  --  159 137*   Cardiac Enzymes: No results found for this basename: CKTOTAL, CKMB, CKMBINDEX, TROPONINI,  in the last 168 hours  BNP (last 3 results)  Recent Labs  04/18/13 2220 01/10/14 1728  PROBNP 171.5* 66.7   CBG: No results found for this basename: GLUCAP,  in the last 168 hours  Radiological Exams on Admission: No  results found.  EKG: will order EKG.   Assessment/Plan Active Problems:   Morbid obesity   Hypertension   Asthma, chronic   Obstructive sleep apnea   DVT (deep venous thrombosis)   DVT femoral (deep venous thrombosis) with thrombophlebitis  1-Right LE DVT:  extensive acute occlusive deep vein thrombosis involving the right saphenofemoral junction, common femoral, profunda femoral, femoral, popliteal, posterior tibial, and peroneal veins. -Continue with heparin Gtt.  -Vascular evaluation due to extensive DVT, patient relates numbness and decrease sensation RLE.   2-Chest pain: will order EKG, troponin, Ct angio rule out PE.   3-HTN; hold HCTZ, SBP in the 110 range.   4-Chronic pain: continue with home regimen.  5-Depression: Continue with Zoloft, trazodone.  6-Anxiety: continue with ativan.      Code Status: Full Code.  DVT Prophylaxis:on heparin Gtt.  Family Communication: Care discussed with patient.  Disposition Plan: expect 3 to 4 days inpatient.   Time spent: 75 minutes.   Niel Hummer A [facility] Pager [phone]  **Disclaimer: This note may have been dictated with voice recognition software. Similar sounding words can inadvertently be transcribed and this note may contain transcription errors which may not have been corrected upon publication of note.**

## 2014-02-16 NOTE — Telephone Encounter (Signed)
Patient has called in today to in from the PCP that her leg is swelling from thigh all the way down to her foot; patient states that the swelling is progressive and has been going on for the past few days; patient was told that it was a blood clot; please f/u with patient asap [phone]

## 2014-02-16 NOTE — ED Provider Notes (Signed)
CSN: [id number]     Arrival date & time 02/16/14  1514 History   First MD Initiated Contact with Patient 02/16/14 1803     Chief Complaint  Patient presents with  . Leg Swelling  . Leg Pain     (Consider location/radiation/quality/duration/timing/severity/associated sxs/prior Treatment) HPI The patient is presenting with a diagnosis of superficial thrombophlebitis. This was diagnosed 2 days ago. She reports that she's had significant swelling in the leg and pain. She reports it has gotten much worse and that her pain medications are not helping at all and she is very comfortable. She reports now the swelling is all away from her thigh down to her foot. She reports it hurts at rest it hurts with walking anything she does is painful to it. She denies associated chest pain or shortness of breath. The patient does have a history COPD asthma but denies that he is particularly worse than baseline at this point time.  Past Medical History  Diagnosis Date  . Hypertension   . Asthma   . COPD (chronic obstructive pulmonary disease)   . Anxiety   . Cancer   . Colon cancer   . Dyslipidemia   . Migraine headache   . Chronic bronchitis   . Uterine fibroid   . Ovarian cyst   . GERD (gastroesophageal reflux disease)   . Morbid obesity   . Obstructive sleep apnea     mild  . Chronic pain   . Chronic back pain   . Arthritis   . Osteoarthritis   . Depression    Past Surgical History  Procedure Laterality Date  . Knee arthroscopy      bil.  . Carpal tunnel release      rt  . Mouth surgery      teeth extraction  . Cesarean section    . Subtotal colectomy    . Colonoscopy    . Tubal ligation     Family History  Problem Relation Age of Onset  . Uterine cancer Mother   . Stroke Father   . Colon cancer Maternal Uncle   . Diabetes Father   . Ovarian cancer Mother   . Hypertension Father   . Breast cancer Maternal Grandmother   . Diabetes Sister   . Asthma Child   . Asthma Child     History  Substance Use Topics  . Smoking status: Current Some Day Smoker -- 1.00 packs/day for 37 years    Types: Cigarettes  . Smokeless tobacco: Never Used     Comment: trying to quit, down to .5ppd X2 years ago.   . Alcohol Use: No   OB History   Grav Para Term Preterm Abortions TAB SAB Ect Mult Living   2 2 2       2      Review of Systems  10 Systems reviewed and are negative for acute change except as noted in the HPI.   Allergies  Kiwi extract and Aspirin  Home Medications   Prior to Admission medications   Medication Sig Start Date End Date Taking? Authorizing Provider  fluticasone (FLOVENT HFA) 110 MCG/ACT inhaler Inhale 2 puffs into the lungs every 12 (twelve) hours. 01/17/14  Yes Lorayne Marek, MD  hydrochlorothiazide (HYDRODIURIL) 50 MG tablet Take 1 tablet (50 mg total) by mouth daily. 01/17/14  Yes Lorayne Marek, MD  ibuprofen (ADVIL,MOTRIN) 800 MG tablet Take 1 tablet by mouth 2 (two) times daily as needed for moderate pain.  08/04/13  Yes Historical Provider,  MD  LORazepam (ATIVAN) 1 MG tablet Take 1 tablet (1 mg total) by mouth every 6 (six) hours as needed for anxiety (anxiety). 01/17/14  Yes Lorayne Marek, MD  omeprazole (PRILOSEC) 40 MG capsule Take 1 capsule (40 mg total) by mouth daily. 01/17/14  Yes Lorayne Marek, MD  oxyCODONE-acetaminophen (PERCOCET/ROXICET) 5-325 MG per tablet Take 1-2 tablets by mouth every 4 (four) hours as needed for moderate pain or severe pain.   Yes Historical Provider, MD  potassium chloride SA (K-DUR,KLOR-CON) 20 MEQ tablet Take 2 tablets (40 mEq total) by mouth 2 (two) times daily. 02/12/14  Yes Shaune Pollack, MD  predniSONE (DELTASONE) 10 MG tablet Take 2 tablets (20 mg total) by mouth daily. 02/12/14  Yes Shaune Pollack, MD  sertraline (ZOLOFT) 100 MG tablet Take 1 tablet (100 mg total) by mouth daily. 01/17/14  Yes Lorayne Marek, MD  traZODone (DESYREL) 50 MG tablet Take 1 tablet (50 mg total) by mouth at bedtime. 01/17/14   Lorayne Marek, MD   BP 107/56  Pulse 67  Temp(Src) 98.3 F (36.8 C) (Oral)  Resp 18  SpO2 97%  LMP 05/24/2003 Physical Exam  Constitutional: She is oriented to person, place, and time.  The patient is a morbidly obese female who appears to be in moderate to severe pain. Her color is good she has no respiratory distress and her mental status is clear.  HENT:  Head: Normocephalic and atraumatic.  Eyes: EOM are normal. Pupils are equal, round, and reactive to light.  Neck: Neck supple.  Cardiovascular: Normal rate, regular rhythm, normal heart sounds and intact distal pulses.   Pulmonary/Chest: Effort normal. No respiratory distress. She has wheezes.  Abdominal: Soft. Bowel sounds are normal. She exhibits no distension. There is no tenderness.  Musculoskeletal: She exhibits edema.  The patient has a large edema of the right lower extremity. Erythema and swelling starts in the upper thigh. Then this extends down into the lower legs such that the calf and foot are approximately 2+ and pitting. The foot is warm and dry with good capillary refill. The leg is very tender to palpation. The left leg bite comparison is obese however the skin is soft and soft tissue do not show any significant edema or tension.  Neurological: She is alert and oriented to person, place, and time. She has normal strength. Coordination normal. GCS eye subscore is 4. GCS verbal subscore is 5. GCS motor subscore is 6.  Skin: Skin is warm, dry and intact.  Psychiatric: She has a normal mood and affect.   A recheck on the leg at 2049 shows a swelling to still be approximately consistent with prior. The patient does have a 2+ palpable dorsalis pedis pulse. She can move the toes appropriately. She does however and/or some numbness and tingling sensation.  ED Course  Procedures (including critical care time) Labs Review Labs Reviewed  BASIC METABOLIC PANEL - Abnormal; Notable for the following:    BUN 24 (*)    GFR calc non Af  Amer 55 (*)    GFR calc Af Amer 64 (*)    Anion gap 17 (*)    All other components within normal limits  APTT - Abnormal; Notable for the following:    aPTT 22 (*)    All other components within normal limits  CBC WITH DIFFERENTIAL - Abnormal; Notable for the following:    WBC 11.2 (*)    Platelets 137 (*)    All other components within normal  limits  PROTIME-INR  HEPARIN LEVEL (UNFRACTIONATED)    Imaging Review No results found.   EKG Interpretation None     Vascular surgery was consulted. We discussed the physical exam findings as outlined above. At this point in time and they will await consult from internal medicine if there is ongoing concern for development of a compartment syndrome type presentation. MDM   Final diagnoses:  DVT femoral (deep venous thrombosis) with thrombophlebitis, right   the patient presents with a prior diagnosis of superficial thrombus phlebitis which is now extended into the deep system to include an extensive amount of clot with associated edema and pain. Patient does not endorse any signs of pulmonary bolus at this point time oxygenation is good she has no chest pain or dyspnea reported. The patient will be admitted for anticoagulation and pain control.    Charlesetta Shanks, MD 02/16/14 2108

## 2014-02-16 NOTE — Consult Note (Signed)
[facility] [place] HISTORY AND PHYSICAL  Reason for consult: DVT  Requesting: Dr. Frederic Jericho History of Present Illness:  Patient is a 58 y.o. year old female who presents for evaluation of right leg DVT. The patient had swelling in her right leg a few days ago. She was initially diagnosed with thrombophlebitis. The leg becoming worse over the next few days. She presented to the emergency room. She was found to have DVT in the right leg by duplex ultrasound. She denies any prior history of DVT. She denies any prior history of pulmonary embolus. She has no shortness of breath. She has not had any hemoptysis. She does not take oral contraceptives. She does smoke but is trying to quit. She has a history of colon cancer in the remote past. She states she receives intermittent surveillance colonoscopy. She has significant obesity. She has discussed with her primary care physician trying to lose weight.  Other medical problems include hypertension, COPD, asthma, reflux, obesity, sleep apnea, chronic back pain and arthritis, depression. All these are currently controlled.  Past Medical History  Diagnosis Date  . Hypertension   . Asthma   . COPD (chronic obstructive pulmonary disease)   . Anxiety   . Cancer   . Colon cancer   . Dyslipidemia   . Migraine headache   . Chronic bronchitis   . Uterine fibroid   . Ovarian cyst   . GERD (gastroesophageal reflux disease)   . Morbid obesity   . Obstructive sleep apnea     mild  . Chronic pain   . Chronic back pain   . Arthritis   . Osteoarthritis   . Depression     Past Surgical History  Procedure Laterality Date  . Knee arthroscopy      bil.  . Carpal tunnel release      rt  . Mouth surgery      teeth extraction  . Cesarean section    . Subtotal colectomy    . Colonoscopy    . Tubal ligation      Social History History  Substance Use Topics  . Smoking status: Current Some Day Smoker -- 1.00 packs/day for 37 years     Types: Cigarettes  . Smokeless tobacco: Never Used     Comment: trying to quit, down to .5ppd X2 years ago.   . Alcohol Use: No    Family History Family History  Problem Relation Age of Onset  . Uterine cancer Mother   . Stroke Father   . Colon cancer Maternal Uncle   . Diabetes Father   . Ovarian cancer Mother   . Hypertension Father   . Breast cancer Maternal Grandmother   . Diabetes Sister   . Asthma Child   . Asthma Child     Allergies  Allergies  Allergen Reactions  . Kiwi Extract Hives and Swelling  . Aspirin Nausea Only    Can take as long as enteric coated     Current Facility-Administered Medications  Medication Dose Route Frequency Provider Last Rate Last Dose  . 0.9 %  sodium chloride infusion   Intravenous Continuous Charlesetta Shanks, MD 100 mL/hr at 02/16/14 2017    . heparin ADULT infusion 100 units/mL (25000 units/250 mL)  1,050 Units/hr Intravenous Continuous Julio Sicks, RPH 10.5 mL/hr at 02/16/14 2045 1,050 Units/hr at 02/16/14 2045  . iohexol (OMNIPAQUE) 350 MG/ML injection 100 mL  100 mL Intravenous Once PRN Medication Radiologist, MD      .  nicotine (NICODERM CQ - dosed in mg/24 hours) patch 14 mg  14 mg Transdermal Daily Charlesetta Shanks, MD   14 mg at 02/16/14 2044   Current Outpatient Prescriptions  Medication Sig Dispense Refill  . fluticasone (FLOVENT HFA) 110 MCG/ACT inhaler Inhale 2 puffs into the lungs every 12 (twelve) hours.  1 Inhaler  12  . hydrochlorothiazide (HYDRODIURIL) 50 MG tablet Take 1 tablet (50 mg total) by mouth daily.  30 tablet  3  . ibuprofen (ADVIL,MOTRIN) 800 MG tablet Take 1 tablet by mouth 2 (two) times daily as needed for moderate pain.       Marland Kitchen LORazepam (ATIVAN) 1 MG tablet Take 1 tablet (1 mg total) by mouth every 6 (six) hours as needed for anxiety (anxiety).  30 tablet  0  . omeprazole (PRILOSEC) 40 MG capsule Take 1 capsule (40 mg total) by mouth daily.  30 capsule  3  . oxyCODONE-acetaminophen (PERCOCET/ROXICET)  5-325 MG per tablet Take 1-2 tablets by mouth every 4 (four) hours as needed for moderate pain or severe pain.      . potassium chloride SA (K-DUR,KLOR-CON) 20 MEQ tablet Take 2 tablets (40 mEq total) by mouth 2 (two) times daily.  12 tablet  0  . predniSONE (DELTASONE) 10 MG tablet Take 2 tablets (20 mg total) by mouth daily.  15 tablet  0  . sertraline (ZOLOFT) 100 MG tablet Take 1 tablet (100 mg total) by mouth daily.  30 tablet  3  . traZODone (DESYREL) 50 MG tablet Take 1 tablet (50 mg total) by mouth at bedtime.  30 tablet  2    ROS:   General:  No weight loss, Fever, chills  HEENT: No recent headaches, no nasal bleeding, no visual changes, no sore throat  Neurologic: No dizziness, blackouts, seizures. No recent symptoms of stroke or mini- stroke. No recent episodes of slurred speech, or temporary blindness.  Cardiac: No recent episodes of chest pain/pressure, no shortness of breath at rest.  + shortness of breath with exertion.  Denies history of atrial fibrillation or irregular heartbeat  Vascular: No history of rest pain in feet.  No history of claudication.  No history of non-healing ulcer, No history of DVT   Pulmonary: No home oxygen, no productive cough, no hemoptysis,  No asthma or wheezing  Musculoskeletal:  [x ] Arthritis, [x ] Low back pain,  [x ] Joint pain  Hematologic:No history of hypercoagulable state.  No history of easy bleeding.  No history of anemia  Gastrointestinal: No hematochezia or melena,  + gastroesophageal reflux, no trouble swallowing  Urinary: [ ]  chronic Kidney disease, [ ]  on HD - [ ]  MWF or [ ]  TTHS, [ ]  Burning with urination, [ ]  Frequent urination, [ ]  Difficulty urinating;   Skin: No rashes  Psychological: No history of anxiety,  + history of depression   Physical Examination  Filed Vitals:   02/16/14 1526 02/16/14 1843 02/16/14 2020 02/16/14 2150  BP: 131/54 131/77 107/56 95/44  Pulse: 67 70 67 80  Temp: 98.8 F (37.1 C)  98.3 F  (36.8 C)   TempSrc: Oral  Oral   Resp: 20 18 18 11   SpO2: 99% 95% 97% 97%    There is no weight on file to calculate BMI.  General:  Alert and oriented, no acute distress HEENT: Normal Neck: No bruit or JVD Pulmonary: Clear to auscultation bilaterally Cardiac: Regular Rate and Rhythm without murmur Abdomen: Soft, non-tender, non-distended, well-healed midline scar, severely obese Skin:  No rash Extremity Pulses:  2+ radial, brachial, femoral, dorsalis pedis pulses bilaterally Musculoskeletal: No deformity has some edema right leg approximately 10% larger than left leg both legs are quite large and obese, she has no blistering of the skin. The skin itself is not tense. She does have some mild pain on palpation but does not have an exam consistent with compartment syndrome. Neurologic: Upper and lower extremity motor 5/5 and symmetric  DATA:  Preliminary result duplex ultrasound DVT right leg   ASSESSMENT:  DVT right leg without evidence of pulmonary embolus no contraindication to anticoagulation no evidence of leg major no evidence of compartment syndrome   PLAN:  Patient needs IV heparinization followed by long term anticoagulation orally. I discussed with the patient benefits of weight loss as well as smoking cessation is the may be contributing to her development of a DVT. I also discussed with the patient she may be at risk long-term for postphlebitic syndrome after extensive DVT. I discussed with her that she may have chronic long-term swelling of the right lower extremity. After her DVT has resolved. She would benefit from lower extremity compression stockings.  Ruta Hinds, MD [facility] [place] Office: [phone] Pager: [phone]

## 2014-02-17 DIAGNOSIS — F3289 Other specified depressive episodes: Secondary | ICD-10-CM

## 2014-02-17 DIAGNOSIS — F329 Major depressive disorder, single episode, unspecified: Secondary | ICD-10-CM

## 2014-02-17 DIAGNOSIS — J45901 Unspecified asthma with (acute) exacerbation: Secondary | ICD-10-CM

## 2014-02-17 LAB — TROPONIN I: Troponin I: <0.3 ng/mL (ref <0.30)

## 2014-02-17 LAB — HEPARIN LEVEL (UNFRACTIONATED)
Heparin Unfractionated: 0.23 IU/mL — ABNORMAL LOW (ref 0.30–0.70)
Heparin Unfractionated: 0.85 IU/mL — ABNORMAL HIGH (ref 0.30–0.70)
Heparin Unfractionated: 0.6 IU/mL (ref 0.30–0.70)

## 2014-02-17 MED ORDER — IPRATROPIUM-ALBUTEROL 0.5-2.5 (3) MG/3ML IN SOLN
3.0000 mL | Freq: Four times a day (QID) | RESPIRATORY_TRACT | Status: DC
  Administered 2014-02-17 – 2014-02-18 (×4): 3 mL via RESPIRATORY_TRACT
  Filled 2014-02-17 (×4): qty 3

## 2014-02-17 MED ORDER — COUMADIN BOOK
Freq: Once | Status: AC
  Administered 2014-02-17: 1
  Filled 2014-02-17: qty 1

## 2014-02-17 MED ORDER — HYDROMORPHONE HCL PF 1 MG/ML IJ SOLN
1.0000 mg | INTRAMUSCULAR | Status: DC | PRN
  Administered 2014-02-17 (×2): 1 mg via INTRAVENOUS
  Filled 2014-02-17 (×2): qty 1

## 2014-02-17 MED ORDER — WARFARIN SODIUM 10 MG PO TABS
10.0000 mg | ORAL_TABLET | Freq: Once | ORAL | Status: AC
  Administered 2014-02-17: 10 mg via ORAL
  Filled 2014-02-17: qty 1

## 2014-02-17 MED ORDER — LORAZEPAM 1 MG PO TABS
1.0000 mg | ORAL_TABLET | ORAL | Status: DC | PRN
Start: 2014-02-17 — End: 2014-02-20
  Administered 2014-02-17 – 2014-02-20 (×11): 1 mg via ORAL
  Filled 2014-02-17 (×11): qty 1

## 2014-02-17 MED ORDER — HEPARIN (PORCINE) IN NACL 100-0.45 UNIT/ML-% IJ SOLN
1250.0000 [IU]/h | INTRAMUSCULAR | Status: DC
  Filled 2014-02-17: qty 250

## 2014-02-17 MED ORDER — HEPARIN (PORCINE) IN NACL 100-0.45 UNIT/ML-% IJ SOLN
1100.0000 [IU]/h | INTRAMUSCULAR | Status: DC
  Administered 2014-02-17 – 2014-02-20 (×4): 1100 [IU]/h via INTRAVENOUS
  Filled 2014-02-17 (×6): qty 250

## 2014-02-17 MED ORDER — NICOTINE 21 MG/24HR TD PT24
21.0000 mg | MEDICATED_PATCH | Freq: Every day | TRANSDERMAL | Status: DC
  Administered 2014-02-17 – 2014-02-20 (×5): 21 mg via TRANSDERMAL
  Filled 2014-02-17 (×4): qty 1

## 2014-02-17 MED ORDER — WARFARIN - PHARMACIST DOSING INPATIENT: Freq: Every day | Status: DC

## 2014-02-17 MED ORDER — HEPARIN BOLUS VIA INFUSION
1500.0000 [IU] | Freq: Once | INTRAVENOUS | Status: AC
  Administered 2014-02-17: 1500 [IU] via INTRAVENOUS
  Filled 2014-02-17: qty 1500

## 2014-02-17 MED ORDER — WARFARIN VIDEO: Freq: Once | Status: AC

## 2014-02-17 MED ORDER — HYDROMORPHONE HCL PF 2 MG/ML IJ SOLN
2.0000 mg | INTRAMUSCULAR | Status: DC | PRN
  Administered 2014-02-17 – 2014-02-19 (×8): 2 mg via INTRAVENOUS
  Filled 2014-02-17 (×8): qty 1

## 2014-02-17 NOTE — Progress Notes (Addendum)
ANTICOAGULATION CONSULT NOTE - Follow Up Consult  Pharmacy Consult for Heparin/Warfarin Indication: DVT  Allergies  Allergen Reactions  . Kiwi Extract Hives and Swelling  . Aspirin Nausea Only    Can take as long as enteric coated    Patient Measurements: Height: 4\' 11"  (149.9 cm) Weight: 282 lb 3 oz (128 kg) IBW/kg (Calculated) : 43.2 Heparin Dosing Weight: 76 kg  Vital Signs: Temp: 98.4 F (36.9 C) (09/11 0451) Temp src: Oral (09/11 0451) BP: 115/66 mmHg (09/11 0451) Pulse Rate: 84 (09/11 0451)  Labs:  Recent Labs  02/14/14 1554 02/16/14 2024 02/17/14 0230 02/17/14 0258 02/17/14 0912 02/17/14 0913  HGB 13.8 14.7  --   --   --   --   HCT 42.0 44.6  --   --   --   --   PLT 159 137*  --   --   --   --   APTT  --  22*  --   --   --   --   LABPROT  --  13.1  --   --   --   --   INR  --  0.99  --   --   --   --   HEPARINUNFRC  --   --  0.23*  --  0.85*  --   CREATININE 1.03 1.10  --   --   --   --   TROPONINI  --  <0.30  --  <0.30  --  <0.30    Estimated Creatinine Clearance: 69.5 ml/min (by C-G formula based on Cr of 1.1).   Assessment: 51 yoF with PMH significant for colon cancer s/p surgery and chemo admitted with extensive right LE DVT.  Pharmacy consulted to dose heparin infusion and start warfarin.  Significant events: 9/10 Heparin started with 1700 unit bolus then infusion at 1050 units/hr. Subsequent 6 hour HL 0.23 9/11 Heparin 1500 units bolus then infusion increased to 1250 units/hr for low HL.  Subsequent 6 hour HL 0.85.  Start warfarin today.   Heparin level supratherapeutic.  CBC WNL.  SCr 1.10, CrCl>100 ml/min  Baseline INR WNL.  Warfarin score = 8.  Goal of Therapy:  INR 2-3 Heparin level 0.3-0.7 units/ml Monitor platelets by anticoagulation protocol: Yes   Plan:  1.  Reduce heparin infusion rate to 1100 units/hr. 2.  Recheck heparin level 6 hours after rate change. 3.  Daily CBC and HL while on heparin. 4.  Warfarin 10 mg  today. 5.  Warfarin education book/video and pharmacist counseling.  Hershal Coria 02/17/2014,11:03 AM  Addendum: 02/17/2014 1:36 PM Completed warfarin education with patient.  Patient demonstrated a good understanding of the safety and use of warfarin therapy (including drug/food interactions, s/sx's bleeding, INR monitoring)  Hershal Coria, PharmD, BCPS Pager: [phone] 02/17/2014 1:36 PM

## 2014-02-17 NOTE — Progress Notes (Signed)
[facility] PROGRESS NOTE  Brooke [name] ZDG:[id number] DOB: 22-Sep-1955 DOA: 02/16/2014 PCP: Lorayne Marek, MD  Assessment/Plan: 1. Right LE DVT- Continue the heparin, will start warfarin per pharmacy consultation. 2. Asthma exacerbation- Will continue with Po Prednisone 60 mg po daily, continue the Duoneb nebulizer treatments Q 6 hrs. Continue Flovent. 3. Hypertension- HCTZ is on hold due to low BP. Will continue to monitor the BP and restart the meds when BP rises. 4. Depression- Continue zoloft, trazodone. 5. Anxiety- Will restart the Ativan 1 mg q 4 hr prn.  Code Status: Full code Family Communication: No family at bedside Disposition Plan: Home when stable   Consultants:  None  Procedures:  None  Antibiotics:  *None  HPI/Subjective: Patient seen and examined, admitted with DVT. Started on the IV heparin. Vascular surgery has seen the patient, and recommend no intervention.  Objective: Filed Vitals:   02/17/14 1420  BP: 124/64  Pulse: 104  Temp: 97.4 F (36.3 C)  Resp: 20    Intake/Output Summary (Last 24 hours) at 02/17/14 1631 Last data filed at 02/17/14 1500  Gross per 24 hour  Intake 2711.67 ml  Output      0 ml  Net 2711.67 ml   Filed Weights   02/16/14 2245  Weight: 128 kg (282 lb 3 oz)    Exam:   General:  Appear in no acute distress  Cardiovascular: S1S2 RRR  Respiratory: Bilateral wheezing  Abdomen: Soft, nontender  Musculoskeletal: No organomegaly  Data Reviewed: Basic Metabolic Panel:  Recent Labs Lab 02/12/14 1636 02/14/14 1554 02/16/14 2024  NA 138 141 139  K 2.7* 3.6* 3.8  CL 101 99 96  CO2  --  28 26  GLUCOSE 143* 90 99  BUN 17 19 24*  CREATININE 1.00 1.03 1.10  CALCIUM  --  9.2 9.6   Liver Function Tests:  Recent Labs Lab 02/14/14 1554  AST 16  ALT 13  ALKPHOS 74  BILITOT <0.2*  PROT 7.1  ALBUMIN 3.3*   No results found for this basename: LIPASE, AMYLASE,  in the last 168 hours No results found  for this basename: AMMONIA,  in the last 168 hours CBC:  Recent Labs Lab 02/12/14 1625 02/12/14 1636 02/14/14 1554 02/16/14 2024  WBC 10.0  --  10.0 11.2*  NEUTROABS 6.4  --   --  7.1  HGB 13.2 15.0 13.8 14.7  HCT 41.0 44.0 42.0 44.6  MCV 99.5  --  99.5 98.9  PLT 158  --  159 137*   Cardiac Enzymes:  Recent Labs Lab 02/16/14 2024 02/17/14 0258 02/17/14 0913  TROPONINI <0.30 <0.30 <0.30   BNP (last 3 results)  Recent Labs  04/18/13 2220 01/10/14 1728  PROBNP 171.5* 66.7   CBG: No results found for this basename: GLUCAP,  in the last 168 hours  No results found for this or any previous visit (from the past 240 hour(s)).   Studies: Ct Angio Chest Pe W/cm &/or Wo Cm  02/16/2014   CLINICAL DATA:  Shortness of breath.  History of DVTs.  Chest pain.  EXAM: CT ANGIOGRAPHY CHEST WITH CONTRAST  TECHNIQUE: Multidetector CT imaging of the chest was performed using the standard protocol during bolus administration of intravenous contrast. Multiplanar CT image reconstructions and MIPs were obtained to evaluate the vascular anatomy.  CONTRAST:  100 mL Omnipaque 350  COMPARISON:  None.  FINDINGS: Technically adequate study with good opacification of the central and segmental pulmonary arteries. Focal nonocclusive filling defects are demonstrated in  the distal right main pulmonary artery, extending into lower lobe branches and middle lobe branches. Occlusive filling defects demonstrated in left lower lobe proximal segmental branches. Changes are consistent with acute pulmonary embolus. RV to LV ratio is normal. No evidence of right heart strain.  Normal heart size. Normal caliber thoracic aorta. Coronary artery calcifications. Great vessel origins are patent. Esophagus is decompressed. No significant lymphadenopathy in the chest.  Lungs are clear. No focal airspace disease or consolidation. No pneumothorax. No pleural effusions. Airways appear patent.  Included portions of the upper abdominal  organs are grossly unremarkable. Degenerative changes in the spine. No destructive bone lesions.  Review of the MIP images confirms the above findings.  IMPRESSION: Positive for bilateral lower lobe segmental pulmonary emboli.  These results were called by telephone at the time of interpretation on 02/16/2014 at 9:26 pm to Dr. Johnney Killian, who verbally acknowledged these results.   Electronically Signed   By: Lucienne Capers M.D.   On: 02/16/2014 21:30    Scheduled Meds: . fluticasone  2 puff Inhalation Q12H  . ipratropium-albuterol  3 mL Nebulization Q6H  . nicotine  21 mg Transdermal Daily  . pantoprazole  40 mg Oral Daily  . predniSONE  60 mg Oral Daily  . sertraline  100 mg Oral Daily  . sodium chloride  3 mL Intravenous Q12H  . traZODone  50 mg Oral QHS  . Warfarin - Pharmacist Dosing Inpatient   Does not apply q1800   Continuous Infusions: . sodium chloride 100 mL/hr at 02/17/14 1621  . heparin 1,100 Units/hr (02/17/14 1206)    Active Problems:   Morbid obesity   Hypertension   Asthma, chronic   Obstructive sleep apnea   DVT (deep venous thrombosis)   DVT femoral (deep venous thrombosis) with thrombophlebitis    Time spent: 25 min    [facility] Pager [phone] 7PM-7AM, please contact night-coverage at [email or web address], password [facility] 02/17/2014, 4:31 PM  LOS: 1 day

## 2014-02-17 NOTE — Care Management Note (Addendum)
    Page 1 of 1   02/20/2014     3:58:42 PM CARE MANAGEMENT NOTE 02/20/2014  Patient:  Brooke [name], Brooke [name]   Account Number:  [id number]  Date Initiated:  02/17/2014  Documentation initiated by:  Dessa Phi  Subjective/Objective Assessment:   58 Y/O F ADMITTED WR LEG DVT.     Action/Plan:   FROM HOME.   Anticipated DC Date:  02/20/2014   Anticipated DC Plan:  [place]  CM consult  [facility]      Choice offered to / List presented to:             Status of service:  Completed, signed off Medicare Important Message given?  YES (If response is "NO", the following Medicare IM given date fields will be blank) Date Medicare IM given:  02/20/2014 Medicare IM given by:  [facility] Date Additional Medicare IM given:   Additional Medicare IM given by:    Discharge Disposition:  HOME/SELF CARE  Per UR Regulation:  Reviewed for med. necessity/level of care/duration of stay  If discussed at Long Length of Stay Meetings, dates discussed:    Comments:  02/20/14 Brooke Breunig RN,BSN NCM [phone] 4P-TC TO PATIENT HOME TEL# TO INFORM OF PT/INR TO BE CHECKED @ [facility] ON THURS 02/23/14. USED MATCH PROGRAM FOR LOVENOX,COUMADIN.INFORMED OF POLICY:1XUSE/WITHIN 12 CAL MONTHS/NO NARCOTICS/$3 CO PAY(CAN AFFORD)SELECT PHARMACIES/NO NARCOTICS-PATIENT VOICED UNDERSTANDING.[facility] HAS THE LOVENOX AVAILABLE,[facility]-DOES NOT HAVE LOVENOX AVAILABLE.PATIENT W/FORM FOR MATCH PROGRAM,& LISTING OF PHARMACIES-AWARE TO GO TO [facility].PATIENT WILL MAKE OWN APPT @ [facility].NO FURTHER D/C NEEDS.  02/17/14 Brooke Ingham RN,BSN NCM [phone] PATIENT SAYS SHE DOES NOT HAVE SCRIPT COVERAGE.WILL CONFIRM.MAY NEED MATCH PROGRAM.INFORMED PATIENT OF MATCH PROGRAM POLICY:1XUSE/IN 12 CALENDAR MONTH/$3 CO PAY/NO NARCOTICS/SELECT PHARMACY/USE WITHIN 7 DAYS.USES [facility] FOR PCP.

## 2014-02-17 NOTE — Progress Notes (Signed)
ANTICOAGULATION CONSULT NOTE - Initial Consult  Pharmacy Consult for Heparin Infusion Indication: DVT  Allergies  Allergen Reactions  . Kiwi Extract Hives and Swelling  . Aspirin Nausea Only    Can take as long as enteric coated    Patient Measurements: Height: 4\' 11"  (149.9 cm) Weight: 282 lb 3 oz (128 kg) IBW/kg (Calculated) : 43.2 Heparin Dosing Weight: 74kg  Vital Signs: Temp: 97.9 F (36.6 C) (09/10 2245) Temp src: Oral (09/10 2245) BP: 128/86 mmHg (09/10 2245) Pulse Rate: 75 (09/10 2245)  Labs:  Recent Labs  02/14/14 1554 02/16/14 2024 02/17/14 0230 02/17/14 0258  HGB 13.8 14.7  --   --   HCT 42.0 44.6  --   --   PLT 159 137*  --   --   APTT  --  22*  --   --   LABPROT  --  13.1  --   --   INR  --  0.99  --   --   HEPARINUNFRC  --   --  0.23*  --   CREATININE 1.03 1.10  --   --   TROPONINI  --  <0.30  --  <0.30    Estimated Creatinine Clearance: 69.5 ml/min (by C-G formula based on Cr of 1.1).   Medical History: Past Medical History  Diagnosis Date  . Hypertension   . Asthma   . COPD (chronic obstructive pulmonary disease)   . Anxiety   . Cancer   . Colon cancer   . Dyslipidemia   . Migraine headache   . Chronic bronchitis   . Uterine fibroid   . Ovarian cyst   . GERD (gastroesophageal reflux disease)   . Morbid obesity   . Obstructive sleep apnea     mild  . Chronic pain   . Chronic back pain   . Arthritis   . Osteoarthritis   . Depression     Assessment: 58yo F in ED with leg swelling. Pharmacy consulted to dose heparin drip for possible DVT.  CBC: Hgb, plts WNL 9/11 0230 HL= 0.23 units/ml, no problems per RN  Goal of Therapy:  Heparin level 0.3-0.7 units/ml Monitor platelets by anticoagulation protocol: Yes   Plan:   Rebolus with 1500 units x1  Increase heparin drip to 1250 units/hr  Check HL in 6 hours and daily  Daily CBC   Dorrene German 02/17/2014 3:56 AM

## 2014-02-17 NOTE — Progress Notes (Signed)
ANTICOAGULATION CONSULT NOTE - Follow Up Consult  Pharmacy Consult for Heparin/Warfarin Indication: DVT  Allergies  Allergen Reactions  . Kiwi Extract Hives and Swelling  . Aspirin Nausea Only    Can take as long as enteric coated    Patient Measurements: Height: 4\' 11"  (149.9 cm) Weight: 282 lb 3 oz (128 kg) IBW/kg (Calculated) : 43.2 Heparin Dosing Weight: 76 kg  Vital Signs: Temp: 97.4 F (36.3 C) (09/11 1420) Temp src: Oral (09/11 1420) BP: 124/64 mmHg (09/11 1420) Pulse Rate: 104 (09/11 1420)  Labs:  Recent Labs  02/16/14 2024 02/17/14 0230 02/17/14 0258 02/17/14 0912 02/17/14 0913 02/17/14 1740  HGB 14.7  --   --   --   --   --   HCT 44.6  --   --   --   --   --   PLT 137*  --   --   --   --   --   APTT 22*  --   --   --   --   --   LABPROT 13.1  --   --   --   --   --   INR 0.99  --   --   --   --   --   HEPARINUNFRC  --  0.23*  --  0.85*  --  0.60  CREATININE 1.10  --   --   --   --   --   TROPONINI <0.30  --  <0.30  --  <0.30  --     Estimated Creatinine Clearance: 69.5 ml/min (by C-G formula based on Cr of 1.1).   Assessment: 32 yoF with PMH significant for colon cancer s/p surgery and chemo admitted with extensive right LE DVT.  Pharmacy consulted to dose heparin infusion and start warfarin.  Significant events: 9/10 Heparin started with 1700 unit bolus then infusion at 1050 units/hr. Subsequent 6 hour HL 0.23 9/11 Heparin 1500 units bolus then infusion increased to 1250 units/hr for low HL.  Subsequent 6 hour HL 0.85.  Start warfarin today.  9/11 AM:  Heparin level supratherapeutic.  CBC WNL.  SCr 1.10, CrCl>100 ml/min  Baseline INR WNL.  Warfarin score = 8. PM:   Heparin level now therapeutic after reducing heparin rate to 1100 units/hr this AM  No reported bleeding   Goal of Therapy:  INR 2-3 Heparin level 0.3-0.7 units/ml Monitor platelets by anticoagulation protocol: Yes   Plan:  1.  Continue current heparin rate of 1100  units/hr 2. Will recheck heparin level in 6 hrs for confirmatory level at current rate   Adrian Saran, PharmD, BCPS Pager [phone] 02/17/2014 6:47 PM

## 2014-02-18 LAB — CBC
HCT: 39.5 % (ref 36.0–46.0)
HEMOGLOBIN: 12.9 g/dL (ref 12.0–15.0)
MCH: 32.7 pg (ref 26.0–34.0)
MCHC: 32.7 g/dL (ref 30.0–36.0)
MCV: 100.3 fL — ABNORMAL HIGH (ref 78.0–100.0)
Platelets: 124 10*3/uL — ABNORMAL LOW (ref 150–400)
RBC: 3.94 MIL/uL (ref 3.87–5.11)
RDW: 14.1 % (ref 11.5–15.5)
WBC: 14 10*3/uL — AB (ref 4.0–10.5)

## 2014-02-18 LAB — HEPARIN LEVEL (UNFRACTIONATED)
HEPARIN UNFRACTIONATED: 0.52 [IU]/mL (ref 0.30–0.70)
Heparin Unfractionated: 0.55 IU/mL (ref 0.30–0.70)

## 2014-02-18 MED ORDER — METHYLPREDNISOLONE SODIUM SUCC 125 MG IJ SOLR
60.0000 mg | Freq: Four times a day (QID) | INTRAMUSCULAR | Status: DC
  Administered 2014-02-18 – 2014-02-20 (×9): 60 mg via INTRAVENOUS
  Filled 2014-02-18 (×13): qty 0.96

## 2014-02-18 MED ORDER — LEVALBUTEROL HCL 0.63 MG/3ML IN NEBU
0.6300 mg | INHALATION_SOLUTION | RESPIRATORY_TRACT | Status: DC
Start: 2014-02-18 — End: 2014-02-20
  Administered 2014-02-18 – 2014-02-20 (×13): 0.63 mg via RESPIRATORY_TRACT
  Filled 2014-02-18 (×25): qty 3

## 2014-02-18 MED ORDER — WARFARIN SODIUM 10 MG PO TABS
10.0000 mg | ORAL_TABLET | Freq: Once | ORAL | Status: AC
  Administered 2014-02-18: 10 mg via ORAL
  Filled 2014-02-18: qty 1

## 2014-02-18 MED ORDER — LEVALBUTEROL HCL 0.63 MG/3ML IN NEBU: 0.6300 mg | INHALATION_SOLUTION | Freq: Four times a day (QID) | RESPIRATORY_TRACT | Status: DC

## 2014-02-18 MED ORDER — IPRATROPIUM BROMIDE 0.02 % IN SOLN
0.5000 mg | RESPIRATORY_TRACT | Status: DC
  Administered 2014-02-18 – 2014-02-20 (×13): 0.5 mg via RESPIRATORY_TRACT
  Filled 2014-02-18 (×13): qty 2.5

## 2014-02-18 MED ORDER — ALUM & MAG HYDROXIDE-SIMETH 200-200-20 MG/5ML PO SUSP
30.0000 mL | Freq: Four times a day (QID) | ORAL | Status: DC | PRN
  Administered 2014-02-18 – 2014-02-20 (×4): 30 mL via ORAL
  Filled 2014-02-18 (×4): qty 30

## 2014-02-18 MED ORDER — FENTANYL 25 MCG/HR TD PT72
25.0000 ug | MEDICATED_PATCH | TRANSDERMAL | Status: DC
  Administered 2014-02-18: 25 ug via TRANSDERMAL
  Filled 2014-02-18: qty 1

## 2014-02-18 NOTE — Progress Notes (Signed)
PHARMACY - HEPARIN (brief note)  Heparin drip infusing @ 1100 units/hr Heparin level = 0.55 (goal 5.2-7.7) No complications of therapy noted  PLAN:  Continue IV Heparin @ 1100 units/hr              Follow daily heparin levels  Dolly Rias RPh 02/18/2014, 7:42 AM Pager [phone]

## 2014-02-18 NOTE — Progress Notes (Signed)
PHARMACY - HEPARIN (brief note)  Heparin drip infusing @ 1100 units/hr Heparin level = 0.52 (goal 9.3-2.3) No complications of therapy noted  PLAN:  Continue IV Heparin @ 1100 units/hr              F/U AM labs  Leone Haven, PharmD 02/17/14 @ 00:32

## 2014-02-18 NOTE — Progress Notes (Signed)
ANTICOAGULATION CONSULT NOTE - Follow Up Consult  Pharmacy Consult for Heparin/Warfarin Indication: DVT  Allergies  Allergen Reactions  . Kiwi Extract Hives and Swelling  . Aspirin Nausea Only    Can take as long as enteric coated    Patient Measurements: Height: 4\' 11"  (149.9 cm) Weight: 282 lb 3 oz (128 kg) IBW/kg (Calculated) : 43.2 Heparin Dosing Weight: 76 kg  Vital Signs: Temp: 97.8 F (36.6 C) (09/12 0532) Temp src: Oral (09/12 0532) BP: 122/80 mmHg (09/12 0532) Pulse Rate: 81 (09/12 0532)  Labs:  Recent Labs  02/16/14 2024  02/17/14 0258  02/17/14 0913 02/17/14 1740 02/18/14 0001 02/18/14 0621  HGB 14.7  --   --   --   --   --   --  12.9  HCT 44.6  --   --   --   --   --   --  39.5  PLT 137*  --   --   --   --   --   --  124*  APTT 22*  --   --   --   --   --   --   --   LABPROT 13.1  --   --   --   --   --   --   --   INR 0.99  --   --   --   --   --   --   --   HEPARINUNFRC  --   < >  --   < >  --  0.60 0.52 0.55  CREATININE 1.10  --   --   --   --   --   --   --   TROPONINI <0.30  --  <0.30  --  <0.30  --   --   --   < > = values in this interval not displayed.  Estimated Creatinine Clearance: 69.5 ml/min (by C-G formula based on Cr of 1.1).   Assessment: 46 yoF with PMH significant for colon cancer s/p surgery and chemo admitted with extensive right LE DVT.  Pharmacy consulted to dose heparin infusion and start warfarin.  Significant events: 9/10 Heparin started with 1700 unit bolus then infusion at 1050 units/hr. Subsequent 6 hour HL 0.23 9/11 Heparin 1500 units bolus then infusion increased to 1250 units/hr for low HL.  Subsequent 6 hour HL 0.85.  Start warfarin today. Patient received warfarin education. 9/12 Heparin 0.55, no INR ordered   Heparin level therapeutic X 2  CBC WNL.  SCr 1.10, CrCl>100 ml/min  Baseline INR WNL.  Warfarin score = 8.  Goal of Therapy:  INR 2-3 Heparin level 0.3-0.7 units/ml Monitor platelets by  anticoagulation protocol: Yes   Plan:  1.  Continue  heparin infusion @1100  units/hr. 2.  Daily CBC and HL while on heparin. 3.  Warfarin 10 mg today.  Dolly Rias RPh 02/18/2014, 9:51 AM Pager [phone]

## 2014-02-18 NOTE — Progress Notes (Signed)
[facility] PROGRESS NOTE  Brooke [name] QQP:[id number] DOB: 04/13/1956 DOA: 02/16/2014 PCP: Lorayne Marek, MD  Assessment/Plan:  1. Right LE DVT- Continue the heparin, will start warfarin per pharmacy consultation. Patient has right leg pain, will prescribe fentanyl patch 25 mcg q 72 hrs, and continue with prn Dilaudid. 2. Asthma exacerbation- Will change the prednisone to solumedrol 60 mg IV q 6 hrs, continue the Duoneb nebulizer treatments Q 6 hrs. Continue Flovent. 3. Hypertension- HCTZ is on hold due to low BP. Will continue to monitor the BP and restart the meds when BP rises. 4. Depression- Continue zoloft, trazodone. 5. Anxiety- Will restart the Ativan 1 mg q 4 hr prn.   Code Status: Full code Family Communication: No family at bedside Disposition Plan: Home when stable   Consultants:  None  Procedures:  None  Antibiotics:  *None  HPI/Subjective: Patient seen and examined, admitted with DVT. Started on the IV heparin. Patient wants warfarin and not newer anticoagulants. She still complains of right leg pain, says that the dilaudid is not enough to hold the pain.  Objective: Filed Vitals:   02/18/14 0532  BP: 122/80  Pulse: 81  Temp: 97.8 F (36.6 C)  Resp: 16    Intake/Output Summary (Last 24 hours) at 02/18/14 1151 Last data filed at 02/18/14 0732  Gross per 24 hour  Intake 3394.6 ml  Output      0 ml  Net 3394.6 ml   Filed Weights   02/16/14 2245  Weight: 128 kg (282 lb 3 oz)    Exam:   General:  Appear in no acute distress  Cardiovascular: S1S2 RRR  Respiratory: Bilateral wheezing  Abdomen: Soft, nontender  Musculoskeletal: Right lower extremity is edematous, no erythema noted, tender to palpation  Data Reviewed: Basic Metabolic Panel:  Recent Labs Lab 02/12/14 1636 02/14/14 1554 02/16/14 2024  NA 138 141 139  K 2.7* 3.6* 3.8  CL 101 99 96  CO2  --  28 26  GLUCOSE 143* 90 99  BUN 17 19 24*  CREATININE 1.00 1.03 1.10   CALCIUM  --  9.2 9.6   Liver Function Tests:  Recent Labs Lab 02/14/14 1554  AST 16  ALT 13  ALKPHOS 74  BILITOT <0.2*  PROT 7.1  ALBUMIN 3.3*   No results found for this basename: LIPASE, AMYLASE,  in the last 168 hours No results found for this basename: AMMONIA,  in the last 168 hours CBC:  Recent Labs Lab 02/12/14 1625 02/12/14 1636 02/14/14 1554 02/16/14 2024 02/18/14 0621  WBC 10.0  --  10.0 11.2* 14.0*  NEUTROABS 6.4  --   --  7.1  --   HGB 13.2 15.0 13.8 14.7 12.9  HCT 41.0 44.0 42.0 44.6 39.5  MCV 99.5  --  99.5 98.9 100.3*  PLT 158  --  159 137* 124*   Cardiac Enzymes:  Recent Labs Lab 02/16/14 2024 02/17/14 0258 02/17/14 0913  TROPONINI <0.30 <0.30 <0.30   BNP (last 3 results)  Recent Labs  04/18/13 2220 01/10/14 1728  PROBNP 171.5* 66.7   CBG: No results found for this basename: GLUCAP,  in the last 168 hours  No results found for this or any previous visit (from the past 240 hour(s)).   Studies: Ct Angio Chest Pe W/cm &/or Wo Cm  02/16/2014   CLINICAL DATA:  Shortness of breath.  History of DVTs.  Chest pain.  EXAM: CT ANGIOGRAPHY CHEST WITH CONTRAST  TECHNIQUE: Multidetector CT imaging of the chest  was performed using the standard protocol during bolus administration of intravenous contrast. Multiplanar CT image reconstructions and MIPs were obtained to evaluate the vascular anatomy.  CONTRAST:  100 mL Omnipaque 350  COMPARISON:  None.  FINDINGS: Technically adequate study with good opacification of the central and segmental pulmonary arteries. Focal nonocclusive filling defects are demonstrated in the distal right main pulmonary artery, extending into lower lobe branches and middle lobe branches. Occlusive filling defects demonstrated in left lower lobe proximal segmental branches. Changes are consistent with acute pulmonary embolus. RV to LV ratio is normal. No evidence of right heart strain.  Normal heart size. Normal caliber thoracic aorta.  Coronary artery calcifications. Great vessel origins are patent. Esophagus is decompressed. No significant lymphadenopathy in the chest.  Lungs are clear. No focal airspace disease or consolidation. No pneumothorax. No pleural effusions. Airways appear patent.  Included portions of the upper abdominal organs are grossly unremarkable. Degenerative changes in the spine. No destructive bone lesions.  Review of the MIP images confirms the above findings.  IMPRESSION: Positive for bilateral lower lobe segmental pulmonary emboli.  These results were called by telephone at the time of interpretation on 02/16/2014 at 9:26 pm to Dr. Johnney Killian, who verbally acknowledged these results.   Electronically Signed   By: Lucienne Capers M.D.   On: 02/16/2014 21:30    Scheduled Meds: . fentaNYL  25 mcg Transdermal Q72H  . fluticasone  2 puff Inhalation Q12H  . ipratropium  0.5 mg Nebulization Q4H  . levalbuterol  0.63 mg Nebulization Q4H  . methylPREDNISolone (SOLU-MEDROL) injection  60 mg Intravenous Q6H  . nicotine  21 mg Transdermal Daily  . pantoprazole  40 mg Oral Daily  . sertraline  100 mg Oral Daily  . sodium chloride  3 mL Intravenous Q12H  . traZODone  50 mg Oral QHS  . warfarin  10 mg Oral ONCE-1800  . Warfarin - Pharmacist Dosing Inpatient   Does not apply q1800   Continuous Infusions: . sodium chloride 10 mL/hr at 02/18/14 1021  . heparin 1,100 Units/hr (02/18/14 0751)    Active Problems:   Morbid obesity   Hypertension   Asthma, chronic   Obstructive sleep apnea   DVT (deep venous thrombosis)   DVT femoral (deep venous thrombosis) with thrombophlebitis    Time spent: 25 min    [place] [facility] Pager [phone] 7PM-7AM, please contact night-coverage at [email or web address], password [facility] 02/18/2014, 11:51 AM  LOS: 2 days

## 2014-02-19 DIAGNOSIS — G894 Chronic pain syndrome: Secondary | ICD-10-CM

## 2014-02-19 LAB — CBC
HCT: 38.8 % (ref 36.0–46.0)
HEMOGLOBIN: 12.6 g/dL (ref 12.0–15.0)
MCH: 32.2 pg (ref 26.0–34.0)
MCHC: 32.5 g/dL (ref 30.0–36.0)
MCV: 99.2 fL (ref 78.0–100.0)
PLATELETS: 125 10*3/uL — AB (ref 150–400)
RBC: 3.91 MIL/uL (ref 3.87–5.11)
RDW: 14.1 % (ref 11.5–15.5)
WBC: 15.3 10*3/uL — ABNORMAL HIGH (ref 4.0–10.5)

## 2014-02-19 LAB — HEPARIN LEVEL (UNFRACTIONATED): Heparin Unfractionated: 0.54 IU/mL (ref 0.30–0.70)

## 2014-02-19 LAB — PROTIME-INR
INR: 1.19 (ref 0.00–1.49)
Prothrombin Time: 15.1 seconds (ref 11.6–15.2)

## 2014-02-19 MED ORDER — VITAMINS A & D EX OINT
TOPICAL_OINTMENT | CUTANEOUS | Status: AC
  Administered 2014-02-19: 5
  Filled 2014-02-19: qty 5

## 2014-02-19 MED ORDER — WARFARIN SODIUM 10 MG PO TABS
10.0000 mg | ORAL_TABLET | Freq: Once | ORAL | Status: AC
  Administered 2014-02-19: 10 mg via ORAL
  Filled 2014-02-19: qty 1

## 2014-02-19 MED ORDER — KETOROLAC TROMETHAMINE 30 MG/ML IJ SOLN
30.0000 mg | Freq: Once | INTRAMUSCULAR | Status: AC
  Administered 2014-02-19: 30 mg via INTRAVENOUS
  Filled 2014-02-19: qty 1

## 2014-02-19 MED ORDER — OXYCODONE-ACETAMINOPHEN 5-325 MG PO TABS
1.0000 | ORAL_TABLET | ORAL | Status: DC | PRN
  Administered 2014-02-19 – 2014-02-20 (×6): 2 via ORAL
  Filled 2014-02-19 (×6): qty 2

## 2014-02-19 MED ORDER — HYDROMORPHONE HCL PF 2 MG/ML IJ SOLN
2.0000 mg | Freq: Once | INTRAMUSCULAR | Status: AC
  Administered 2014-02-19: 2 mg via INTRAVENOUS
  Filled 2014-02-19: qty 1

## 2014-02-19 NOTE — Progress Notes (Signed)
ANTICOAGULATION CONSULT NOTE - Follow Up Consult  Pharmacy Consult for Heparin/Warfarin Indication: DVT  Allergies  Allergen Reactions  . Kiwi Extract Hives and Swelling  . Aspirin Nausea Only    Can take as long as enteric coated    Patient Measurements: Height: 4\' 11"  (149.9 cm) Weight: 282 lb 3 oz (128 kg) IBW/kg (Calculated) : 43.2 Heparin Dosing Weight: 76 kg  Vital Signs: Temp: 97.6 F (36.4 C) (09/13 0457) Temp src: Oral (09/13 0457) BP: 116/78 mmHg (09/13 0457) Pulse Rate: 74 (09/13 0506)  Labs:  Recent Labs  02/16/14 2024  02/17/14 0258  02/17/14 0913  02/18/14 0001 02/18/14 0621 02/19/14 0430 02/19/14 0445  HGB 14.7  --   --   --   --   --   --  12.9 12.6  --   HCT 44.6  --   --   --   --   --   --  39.5 38.8  --   PLT 137*  --   --   --   --   --   --  124* 125*  --   APTT 22*  --   --   --   --   --   --   --   --   --   LABPROT 13.1  --   --   --   --   --   --   --  15.1  --   INR 0.99  --   --   --   --   --   --   --  1.19  --   HEPARINUNFRC  --   < >  --   < >  --   < > 0.52 0.55  --  0.54  CREATININE 1.10  --   --   --   --   --   --   --   --   --   TROPONINI <0.30  --  <0.30  --  <0.30  --   --   --   --   --   < > = values in this interval not displayed.  Estimated Creatinine Clearance: 69.5 ml/min (by C-G formula based on Cr of 1.1).   Assessment: 11 yoF with PMH significant for colon cancer s/p surgery and chemo admitted with extensive right LE DVT.  Pharmacy consulted to dose heparin infusion and start warfarin.  Significant events: 9/10 Heparin started with 1700 unit bolus then infusion at 1050 units/hr. Subsequent 6 hour HL 0.23 9/11 Heparin 1500 units bolus then infusion increased to 1250 units/hr for low HL.  Subsequent 6 hour HL 0.85.  Start warfarin today. Patient received warfarin education. 9/12 Heparin 0.55, no INR ordered 9/13 Heparin 0.54, INR 1.19, RN reports no issues or complications   Heparin level therapeutic   CBC  WNL.  SCr 1.10, CrCl>100 ml/min  Baseline INR WNL.  Warfarin score = 8.  Goal of Therapy:  INR 2-3 Heparin level 0.3-0.7 units/ml Monitor platelets by anticoagulation protocol: Yes   Plan:  1.  Continue  heparin infusion @1100  units/hr. 2.  Daily CBC and HL while on heparin. 3.  Daily INR 4.  Warfarin 10 mg today.  Dolly Rias RPh 02/19/2014, 9:41 AM Pager [phone]

## 2014-02-19 NOTE — Progress Notes (Signed)
[facility] PROGRESS NOTE  Brooke [name] XUX:[id number] DOB: 04-10-1956 DOA: 02/16/2014 PCP: Lorayne Marek, MD  Assessment/Plan:  1. Right LE DVT- Continue the heparin, will start warfarin per pharmacy consultation. Patient has right leg pain, will prescribe fentanyl patch 25 mcg q 72 hrs, will change the IV Dilaudid to percocet 1-2 tabs q 4 hrs prn. 2. Asthma exacerbation-  Improved,  solumedrol 60 mg IV q 6 hrs, continue the Duoneb nebulizer treatments Q 6 hrs. Continue Flovent. 3. Hypertension- HCTZ is on hold due to low BP. Will continue to monitor the BP and restart the meds when BP rises. 4. Depression- Continue zoloft, trazodone. 5. Anxiety- Will restart the Ativan 1 mg q 4 hr prn.   Code Status: Full code Family Communication: No family at bedside Disposition Plan: Home when stable   Consultants:  None  Procedures:  None  Antibiotics:  *None  HPI/Subjective: Patient seen and examined, admitted with DVT. Started on the IV heparin. Patient wants warfarin and not newer anticoagulants. She still complains of right leg pain, says that the dilaudid is not enough to hold the pain.  Objective: Filed Vitals:   02/19/14 0506  BP:   Pulse: 74  Temp:   Resp: 20    Intake/Output Summary (Last 24 hours) at 02/19/14 1304 Last data filed at 02/19/14 0700  Gross per 24 hour  Intake 1273.5 ml  Output      0 ml  Net 1273.5 ml   Filed Weights   02/16/14 2245  Weight: 128 kg (282 lb 3 oz)    Exam:   General:  Appear in no acute distress  Cardiovascular: S1S2 RRR  Respiratory: Bilateral wheezing  Abdomen: Soft, nontender  Musculoskeletal: Right lower extremity is edematous, no erythema noted, tender to palpation  Data Reviewed: Basic Metabolic Panel:  Recent Labs Lab 02/12/14 1636 02/14/14 1554 02/16/14 2024  NA 138 141 139  K 2.7* 3.6* 3.8  CL 101 99 96  CO2  --  28 26  GLUCOSE 143* 90 99  BUN 17 19 24*  CREATININE 1.00 1.03 1.10  CALCIUM  --   9.2 9.6   Liver Function Tests:  Recent Labs Lab 02/14/14 1554  AST 16  ALT 13  ALKPHOS 74  BILITOT <0.2*  PROT 7.1  ALBUMIN 3.3*   No results found for this basename: LIPASE, AMYLASE,  in the last 168 hours No results found for this basename: AMMONIA,  in the last 168 hours CBC:  Recent Labs Lab 02/12/14 1625 02/12/14 1636 02/14/14 1554 02/16/14 2024 02/18/14 0621 02/19/14 0430  WBC 10.0  --  10.0 11.2* 14.0* 15.3*  NEUTROABS 6.4  --   --  7.1  --   --   HGB 13.2 15.0 13.8 14.7 12.9 12.6  HCT 41.0 44.0 42.0 44.6 39.5 38.8  MCV 99.5  --  99.5 98.9 100.3* 99.2  PLT 158  --  159 137* 124* 125*   Cardiac Enzymes:  Recent Labs Lab 02/16/14 2024 02/17/14 0258 02/17/14 0913  TROPONINI <0.30 <0.30 <0.30   BNP (last 3 results)  Recent Labs  04/18/13 2220 01/10/14 1728  PROBNP 171.5* 66.7   CBG: No results found for this basename: GLUCAP,  in the last 168 hours  No results found for this or any previous visit (from the past 240 hour(s)).   Studies: No results found.  Scheduled Meds: . fentaNYL  25 mcg Transdermal Q72H  . fluticasone  2 puff Inhalation Q12H  . ipratropium  0.5 mg  Nebulization Q4H  . levalbuterol  0.63 mg Nebulization Q4H  . methylPREDNISolone (SOLU-MEDROL) injection  60 mg Intravenous Q6H  . nicotine  21 mg Transdermal Daily  . pantoprazole  40 mg Oral Daily  . sertraline  100 mg Oral Daily  . sodium chloride  3 mL Intravenous Q12H  . traZODone  50 mg Oral QHS  . warfarin  10 mg Oral ONCE-1800  . Warfarin - Pharmacist Dosing Inpatient   Does not apply q1800   Continuous Infusions: . sodium chloride 10 mL/hr at 02/18/14 1021  . heparin 1,100 Units/hr (02/19/14 0501)    Active Problems:   Morbid obesity   Hypertension   Asthma, chronic   Obstructive sleep apnea   DVT (deep venous thrombosis)   DVT femoral (deep venous thrombosis) with thrombophlebitis    Time spent: 25 min    [place] [facility] Pager  [phone] 7PM-7AM, please contact night-coverage at [email or web address], password [facility] 02/19/2014, 1:04 PM  LOS: 3 days

## 2014-02-19 NOTE — Plan of Care (Signed)
Problem: Phase I Progression Outcomes Goal: Dyspnea controlled at rest (PE) Outcome: Not Applicable Date Met:  16/38/45 CT confirmed that pt did not have a PE

## 2014-02-20 DIAGNOSIS — I82409 Acute embolism and thrombosis of unspecified deep veins of unspecified lower extremity: Secondary | ICD-10-CM

## 2014-02-20 LAB — CBC
HCT: 39.1 % (ref 36.0–46.0)
Hemoglobin: 12.3 g/dL (ref 12.0–15.0)
MCH: 32.2 pg (ref 26.0–34.0)
MCHC: 31.5 g/dL (ref 30.0–36.0)
MCV: 102.4 fL — AB (ref 78.0–100.0)
Platelets: 111 10*3/uL — ABNORMAL LOW (ref 150–400)
RBC: 3.82 MIL/uL — ABNORMAL LOW (ref 3.87–5.11)
RDW: 14.7 % (ref 11.5–15.5)
WBC: 13.3 10*3/uL — ABNORMAL HIGH (ref 4.0–10.5)

## 2014-02-20 LAB — PROTIME-INR
INR: 2.14 — ABNORMAL HIGH (ref 0.00–1.49)
PROTHROMBIN TIME: 23.9 s — AB (ref 11.6–15.2)

## 2014-02-20 LAB — HEPARIN LEVEL (UNFRACTIONATED): Heparin Unfractionated: 0.44 IU/mL (ref 0.30–0.70)

## 2014-02-20 MED ORDER — ENOXAPARIN SODIUM 120 MG/0.8ML ~~LOC~~ SOLN: 120.0000 mg | Freq: Two times a day (BID) | SUBCUTANEOUS | Status: DC

## 2014-02-20 MED ORDER — ENOXAPARIN SODIUM 120 MG/0.8ML ~~LOC~~ SOLN
120.0000 mg | Freq: Two times a day (BID) | SUBCUTANEOUS | Status: DC
  Administered 2014-02-20: 120 mg via SUBCUTANEOUS
  Filled 2014-02-20 (×2): qty 0.8

## 2014-02-20 MED ORDER — OXYCODONE-ACETAMINOPHEN 5-325 MG PO TABS: 1.0000 | ORAL_TABLET | ORAL | Status: DC | PRN

## 2014-02-20 MED ORDER — WARFARIN SODIUM 5 MG PO TABS
5.0000 mg | ORAL_TABLET | Freq: Once | ORAL | Status: DC
  Filled 2014-02-20: qty 1

## 2014-02-20 MED ORDER — PREDNISONE 10 MG PO TABS: ORAL_TABLET | ORAL | Status: DC

## 2014-02-20 MED ORDER — HYDROMORPHONE HCL PF 2 MG/ML IJ SOLN
2.0000 mg | Freq: Once | INTRAMUSCULAR | Status: AC
  Administered 2014-02-20: 2 mg via INTRAVENOUS
  Filled 2014-02-20: qty 1

## 2014-02-20 MED ORDER — FUROSEMIDE 20 MG PO TABS: 20.0000 mg | ORAL_TABLET | Freq: Every day | ORAL | Status: DC

## 2014-02-20 MED ORDER — POTASSIUM CHLORIDE CRYS ER 20 MEQ PO TBCR: 10.0000 meq | EXTENDED_RELEASE_TABLET | Freq: Every day | ORAL | Status: DC

## 2014-02-20 MED ORDER — ALBUTEROL SULFATE HFA 108 (90 BASE) MCG/ACT IN AERS: 2.0000 | INHALATION_SPRAY | Freq: Four times a day (QID) | RESPIRATORY_TRACT | Status: DC | PRN

## 2014-02-20 MED ORDER — WARFARIN SODIUM 5 MG PO TABS: 5.0000 mg | ORAL_TABLET | Freq: Every day | ORAL | Status: DC

## 2014-02-20 NOTE — Progress Notes (Addendum)
ANTICOAGULATION CONSULT NOTE - Follow Up Consult  Pharmacy Consult for Heparin/Warfarin Indication: DVT  Allergies  Allergen Reactions  . Kiwi Extract Hives and Swelling  . Aspirin Nausea Only    Can take as long as enteric coated    Patient Measurements: Height: 4\' 11"  (149.9 cm) Weight: 282 lb 3 oz (128 kg) IBW/kg (Calculated) : 43.2 Heparin Dosing Weight: 76 kg  Vital Signs: Temp: 97.8 F (36.6 C) (09/14 0505) Temp src: Oral (09/14 0505) BP: 119/64 mmHg (09/14 0505) Pulse Rate: 89 (09/14 0505)  Labs:  Recent Labs  02/17/14 0913  02/18/14 0621 02/19/14 0430 02/19/14 0445 02/20/14 0432  HGB  --   < > 12.9 12.6  --  12.3  HCT  --   --  39.5 38.8  --  39.1  PLT  --   --  124* 125*  --  111*  LABPROT  --   --   --  15.1  --  23.9*  INR  --   --   --  1.19  --  2.14*  HEPARINUNFRC  --   < > 0.55  --  0.54 0.44  TROPONINI <0.30  --   --   --   --   --   < > = values in this interval not displayed.  Estimated Creatinine Clearance: 69.5 ml/min (by C-G formula based on Cr of 1.1).   Assessment: 49 yoF with PMH significant for colon cancer s/p surgery and chemo admitted with extensive right LE DVT.  Pharmacy consulted to dose heparin infusion and start warfarin.  Significant events: 9/10 Heparin started with 1700 unit bolus then infusion at 1050 units/hr. Subsequent 6 hour HL 0.23 9/11 Heparin 1500 units bolus then infusion increased to 1250 units/hr for low HL.  Subsequent 6 hour HL 0.85.  Start warfarin today. Patient received warfarin education. 9/12 Heparin 0.55, no INR ordered 9/13 Heparin 0.54, INR 1.19, RN reports no issues or complications  9/51: Day #4/5 heparin/warfarin overlap  INR therapeutic (2.14) and rising quickly now  Heparin level therapeutic (0.44) on 1100 units/hr  H/H wnl and stable, Plts baseline low, decreasing  SCr 1.10, CrCl>100 ml/min  Goal of Therapy:  INR 2-3 Heparin level 0.3-0.7 units/ml Monitor platelets by anticoagulation  protocol: Yes   Plan:  1.  Continue  heparin infusion @1100  units/hr 2.  Daily CBC and HL while on heparin. 3.  Daily INR 4.  Decrease warfarin 5mg  mg today. 5.  Continue heparin (or lovenox) and warfarin to complete 5 days overlap AND therapeutic INR x 24hr per CHEST Guidelines.  Peggyann Juba, PharmD, BCPS Pager: [phone]  02/20/2014, 6:58 AM   Addendum: 1. Lovenox: for discharge, recommend Lovenox 120mg  sq q12h and continue through tomorrow. Heparin stopped, first dose of lovenox given ~12:30 today. Will need two more doses - one for tonight, one for tomorrow morning.  2. Warfarin - recommend checking INR tomorrow if possible. If not, would give warfarin 5mg  again tomorrow and check INR Wednesday.  Peggyann Juba, PharmD, BCPS 02/20/2014 1:08 PM

## 2014-02-20 NOTE — Discharge Summary (Addendum)
Physician Discharge Summary  Brooke [name] FYB:[id number] DOB: 06-10-1955 DOA: 02/16/2014  PCP: Lorayne Marek, MD  Admit date: 02/16/2014 Discharge date: 02/20/2014  Time spent: 50 minutes  Recommendations for Outpatient Follow-up:  1. *Follow up PCP in  3 days to check PT/INR  Discharge Diagnoses:  Active Problems:   Morbid obesity   Hypertension   Asthma, chronic   Obstructive sleep apnea   DVT (deep venous thrombosis)   DVT femoral (deep venous thrombosis) with thrombophlebitis   Discharge Condition: Stable  Diet recommendation: Low salt diet  Filed Weights   02/16/14 2245  Weight: 128 kg (282 lb 3 oz)    History of present illness:  58 y.o. female with PMH significant for colon cancer S/P surgery and chemo, last saw oncologist 1 year ago, history of asthma, current smoker who present to Ed complaining of worsening right lower extremity pain and swelling. She was seen in the ED on the 8 th for legg pain. She was diagnosed with superficial thrombus in the right greater saphenous vein. She was also seen in the ED on Sunday 5 th for SOB. She was prescribe prednisone taper. She relates chest pain on and off since the 8 th, intermittent, She is still complaining of SOB. She relates dry cough. She denies nausea, vomiting. She has multiple BM per day since she had surgery for colon cancer. Patient relates numbness and decrease sensation on right LE.  Evaluation in the ED: Doppler showed: Positive extensive acute occlusive deep vein thrombosis involving the right saphenofemoral junction, common femoral, profunda femoral, femoral, popliteal, posterior tibial, and peroneal veins.  Patient was started on Heparin Gtt.    Hospital Course:  1. Right LE DVT- Patient was started on heparin, started  warfarin per pharmacy consultation. Patient has right leg pain, will continue with prn percocet. INR is therapeutic today, will send her home on warfarin 5 mg po daily with PT/INR check in 3 days.  Will give two more doses of lovenox for total 5 days of overlap. Patient told to call and make an appointment at [facility], in 3 days time to check PT/INR. 2. Asthma exacerbation- Improved, solumedrol 60 mg IV q 6 hrs was started ,  the Duoneb nebulizer treatments Q 6 hrs. Will discharge home on Flovent, prednisone taper,albuterol inhaler prn. 3. Hypertension- HCTZ is on hold due to low BP. BP has been stable in the hospital. 4. LE edma- Patient has RLE edema due to DVT, she also has grade 1 diastolic dysfunction. Will start her on Po lasix 20 mg daily. 5. Depression- Continue zoloft, trazodone.    Procedures:  None  Consultations: None  Discharge Exam: Filed Vitals:   02/20/14 0505  BP: 119/64  Pulse: 89  Temp: 97.8 F (36.6 C)  Resp: 20    General: Appear in no acute distress Cardiovascular: s1s2 RRR Respiratory: *Clear bilaterally  Discharge Instructions You were cared for by a hospitalist during your hospital stay. If you have any questions about your discharge medications or the care you received while you were in the hospital after you are discharged, you can call the unit and asked to speak with the hospitalist on call if the hospitalist that took care of you is not available. Once you are discharged, your primary care physician will handle any further medical issues. Please note that NO REFILLS for any discharge medications will be authorized once you are discharged, as it is imperative that you return to your primary care physician (or establish  a relationship with a primary care physician if you do not have one) for your aftercare needs so that they can reassess your need for medications and monitor your lab values.  Discharge Instructions   Diet - low sodium heart healthy    Complete by:  As directed      Increase activity slowly    Complete by:  As directed           Current Discharge Medication List    START taking these medications   Details   albuterol (PROVENTIL HFA;VENTOLIN HFA) 108 (90 BASE) MCG/ACT inhaler Inhale 2 puffs into the lungs every 6 (six) hours as needed for wheezing or shortness of breath. Qty: 1 Inhaler, Refills: 2    enoxaparin (LOVENOX) 120 MG/0.8ML injection Inject 0.8 mLs (120 mg total) into the skin every 12 (twelve) hours. Qty: 2 Syringe, Refills: 0    warfarin (COUMADIN) 5 MG tablet Take 1 tablet (5 mg total) by mouth daily. Qty: 30 tablet, Refills: 2      CONTINUE these medications which have CHANGED   Details  oxyCODONE-acetaminophen (PERCOCET/ROXICET) 5-325 MG per tablet Take 1-2 tablets by mouth every 4 (four) hours as needed for moderate pain or severe pain. Qty: 30 tablet, Refills: 0    predniSONE (DELTASONE) 10 MG tablet Prednisone 40 mg po daily x 1 day then Prednisone 30 mg po daily x 1 day then Prednisone 20 mg po daily x 1 day then Prednisone 10 mg daily x 1 day then stop... Qty: 15 tablet, Refills: 0      CONTINUE these medications which have NOT CHANGED   Details  fluticasone (FLOVENT HFA) 110 MCG/ACT inhaler Inhale 2 puffs into the lungs every 12 (twelve) hours. Qty: 1 Inhaler, Refills: 12   Associated Diagnoses: Asthma exacerbation in COPD    LORazepam (ATIVAN) 1 MG tablet Take 1 tablet (1 mg total) by mouth every 6 (six) hours as needed for anxiety (anxiety). Qty: 30 tablet, Refills: 0   Associated Diagnoses: Anxiety state, unspecified    omeprazole (PRILOSEC) 40 MG capsule Take 1 capsule (40 mg total) by mouth daily. Qty: 30 capsule, Refills: 3   Associated Diagnoses: Gastroesophageal reflux disease, esophagitis presence not specified    sertraline (ZOLOFT) 100 MG tablet Take 1 tablet (100 mg total) by mouth daily. Qty: 30 tablet, Refills: 3   Associated Diagnoses: Depression    traZODone (DESYREL) 50 MG tablet Take 1 tablet (50 mg total) by mouth at bedtime. Qty: 30 tablet, Refills: 2   Associated Diagnoses: Insomnia      STOP taking these medications      hydrochlorothiazide (HYDRODIURIL) 50 MG tablet      ibuprofen (ADVIL,MOTRIN) 800 MG tablet      potassium chloride SA (K-DUR,KLOR-CON) 20 MEQ tablet        Allergies  Allergen Reactions  . Kiwi Extract Hives and Swelling  . Aspirin Nausea Only    Can take as long as enteric coated      The results of significant diagnostics from this hospitalization (including imaging, microbiology, ancillary and laboratory) are listed below for reference.    Significant Diagnostic Studies: Dg Chest 2 View  02/12/2014   CLINICAL DATA:  Cough and wheezing for 3 days. History of asthma, hypertension, COPD and colon cancer. History of smoking.  EXAM: CHEST  2 VIEW  COMPARISON:  01/30/2014; 04/15/2012  FINDINGS: Grossly unchanged enlarged cardiac silhouette and mediastinal contours. Evaluation of the retrosternal clear space is obscured secondary to overlying soft tissues.  The lungs are hyperexpanded with unchanged mild diffuse slightly nodular thickening of the pulmonary interstitium. No focal airspace opacities. No pleural effusion or pneumothorax. No evidence of edema. Surgical clips are seen within the left upper abdominal quadrant. Unchanged bones.  IMPRESSION: Mild lung hyperexpansion and bronchitic change without acute cardiopulmonary disease.   Electronically Signed   By: Sandi Mariscal M.D.   On: 02/12/2014 14:08   Ct Angio Chest Pe W/cm &/or Wo Cm  02/16/2014   CLINICAL DATA:  Shortness of breath.  History of DVTs.  Chest pain.  EXAM: CT ANGIOGRAPHY CHEST WITH CONTRAST  TECHNIQUE: Multidetector CT imaging of the chest was performed using the standard protocol during bolus administration of intravenous contrast. Multiplanar CT image reconstructions and MIPs were obtained to evaluate the vascular anatomy.  CONTRAST:  100 mL Omnipaque 350  COMPARISON:  None.  FINDINGS: Technically adequate study with good opacification of the central and segmental pulmonary arteries. Focal nonocclusive filling defects are  demonstrated in the distal right main pulmonary artery, extending into lower lobe branches and middle lobe branches. Occlusive filling defects demonstrated in left lower lobe proximal segmental branches. Changes are consistent with acute pulmonary embolus. RV to LV ratio is normal. No evidence of right heart strain.  Normal heart size. Normal caliber thoracic aorta. Coronary artery calcifications. Great vessel origins are patent. Esophagus is decompressed. No significant lymphadenopathy in the chest.  Lungs are clear. No focal airspace disease or consolidation. No pneumothorax. No pleural effusions. Airways appear patent.  Included portions of the upper abdominal organs are grossly unremarkable. Degenerative changes in the spine. No destructive bone lesions.  Review of the MIP images confirms the above findings.  IMPRESSION: Positive for bilateral lower lobe segmental pulmonary emboli.  These results were called by telephone at the time of interpretation on 02/16/2014 at 9:26 pm to Dr. Johnney Killian, who verbally acknowledged these results.   Electronically Signed   By: Lucienne Capers M.D.   On: 02/16/2014 21:30   Dg Chest Portable 1 View  02/14/2014   CLINICAL DATA:  Severe shortness of breath. History of asthma, COPD, colon cancer and hypertension  EXAM: PORTABLE CHEST - 1 VIEW  COMPARISON:  02/12/2014; 01/10/2014; 04/20/2013  FINDINGS: Examination is degraded due to patient body habitus and portable technique.  Grossly unchanged enlarged cardiac silhouette and mediastinal contours. There is grossly unchanged mild diffuse slightly nodular thickening of the pulmonary interstitium. Veiling opacities overlying the bilateral lower lungs are favored to represent overlying breast tissues. No discrete focal airspace opacities. No pleural effusion or pneumothorax. No evidence of edema. No acute osseus abnormalities. At least moderate degenerative change of the bilateral glenohumeral joints is suspected though incompletely  evaluated.  IMPRESSION: Findings suggestive of airways disease / bronchitis. No focal airspace opacities to suggest pneumonia on this AP portable examination.   Electronically Signed   By: Sandi Mariscal M.D.   On: 02/14/2014 16:50    Microbiology: No results found for this or any previous visit (from the past 240 hour(s)).   Labs: Basic Metabolic Panel:  Recent Labs Lab 02/14/14 1554 02/16/14 2024  NA 141 139  K 3.6* 3.8  CL 99 96  CO2 28 26  GLUCOSE 90 99  BUN 19 24*  CREATININE 1.03 1.10  CALCIUM 9.2 9.6   Liver Function Tests:  Recent Labs Lab 02/14/14 1554  AST 16  ALT 13  ALKPHOS 74  BILITOT <0.2*  PROT 7.1  ALBUMIN 3.3*   No results found for this basename: LIPASE, AMYLASE,  in the last 168 hours No results found for this basename: AMMONIA,  in the last 168 hours CBC:  Recent Labs Lab 02/14/14 1554 02/16/14 2024 02/18/14 0621 02/19/14 0430 02/20/14 0432  WBC 10.0 11.2* 14.0* 15.3* 13.3*  NEUTROABS  --  7.1  --   --   --   HGB 13.8 14.7 12.9 12.6 12.3  HCT 42.0 44.6 39.5 38.8 39.1  MCV 99.5 98.9 100.3* 99.2 102.4*  PLT 159 137* 124* 125* 111*   Cardiac Enzymes:  Recent Labs Lab 02/16/14 2024 02/17/14 0258 02/17/14 0913  TROPONINI <0.30 <0.30 <0.30   BNP: BNP (last 3 results)  Recent Labs  04/18/13 2220 01/10/14 1728  PROBNP 171.5* 66.7   CBG: No results found for this basename: GLUCAP,  in the last 168 hours     Signed:  Karen Huhta S  [facility] 02/20/2014, 1:11 PM

## 2014-02-20 NOTE — Progress Notes (Signed)
Pt able to successful give her Lovenox injection. Brooke [name] A

## 2014-02-23 ENCOUNTER — Emergency Department (HOSPITAL_COMMUNITY): Payer: Medicare Other

## 2014-02-23 ENCOUNTER — Inpatient Hospital Stay (HOSPITAL_COMMUNITY)
Admission: EM | Admit: 2014-02-23 | Discharge: 2014-02-26 | DRG: 175 | Disposition: A | Discharge status: Home / Self Care | Payer: Medicare Other | Attending: Internal Medicine | Admitting: Internal Medicine

## 2014-02-23 ENCOUNTER — Encounter (HOSPITAL_COMMUNITY): Payer: Self-pay | Admitting: Emergency Medicine

## 2014-02-23 DIAGNOSIS — Z7901 Long term (current) use of anticoagulants: Secondary | ICD-10-CM

## 2014-02-23 DIAGNOSIS — J449 Chronic obstructive pulmonary disease, unspecified: Secondary | ICD-10-CM | POA: Diagnosis present

## 2014-02-23 DIAGNOSIS — I2699 Other pulmonary embolism without acute cor pulmonale: Secondary | ICD-10-CM | POA: Diagnosis present

## 2014-02-23 DIAGNOSIS — R0602 Shortness of breath: Secondary | ICD-10-CM | POA: Diagnosis not present

## 2014-02-23 DIAGNOSIS — F411 Generalized anxiety disorder: Secondary | ICD-10-CM | POA: Diagnosis present

## 2014-02-23 DIAGNOSIS — Z8500 Personal history of malignant neoplasm of unspecified digestive organ: Secondary | ICD-10-CM | POA: Diagnosis present

## 2014-02-23 DIAGNOSIS — F17200 Nicotine dependence, unspecified, uncomplicated: Secondary | ICD-10-CM | POA: Diagnosis present

## 2014-02-23 DIAGNOSIS — M1990 Unspecified osteoarthritis, unspecified site: Secondary | ICD-10-CM | POA: Diagnosis present

## 2014-02-23 DIAGNOSIS — J4489 Other specified chronic obstructive pulmonary disease: Secondary | ICD-10-CM | POA: Diagnosis present

## 2014-02-23 DIAGNOSIS — Z85038 Personal history of other malignant neoplasm of large intestine: Secondary | ICD-10-CM | POA: Diagnosis not present

## 2014-02-23 DIAGNOSIS — Z800 Family history of malignant neoplasm of digestive organs: Secondary | ICD-10-CM | POA: Diagnosis not present

## 2014-02-23 DIAGNOSIS — E66813 Obesity, class 3: Secondary | ICD-10-CM

## 2014-02-23 DIAGNOSIS — Z9221 Personal history of antineoplastic chemotherapy: Secondary | ICD-10-CM

## 2014-02-23 DIAGNOSIS — G8929 Other chronic pain: Secondary | ICD-10-CM | POA: Diagnosis present

## 2014-02-23 DIAGNOSIS — R0609 Other forms of dyspnea: Secondary | ICD-10-CM | POA: Diagnosis not present

## 2014-02-23 DIAGNOSIS — J9600 Acute respiratory failure, unspecified whether with hypoxia or hypercapnia: Secondary | ICD-10-CM | POA: Diagnosis present

## 2014-02-23 DIAGNOSIS — Z79899 Other long term (current) drug therapy: Secondary | ICD-10-CM | POA: Diagnosis not present

## 2014-02-23 DIAGNOSIS — G4733 Obstructive sleep apnea (adult) (pediatric): Secondary | ICD-10-CM | POA: Diagnosis present

## 2014-02-23 DIAGNOSIS — Z720 Tobacco use: Secondary | ICD-10-CM

## 2014-02-23 DIAGNOSIS — Z6841 Body Mass Index (BMI) 40.0 and over, adult: Secondary | ICD-10-CM

## 2014-02-23 DIAGNOSIS — F329 Major depressive disorder, single episode, unspecified: Secondary | ICD-10-CM | POA: Diagnosis present

## 2014-02-23 DIAGNOSIS — I82409 Acute embolism and thrombosis of unspecified deep veins of unspecified lower extremity: Secondary | ICD-10-CM

## 2014-02-23 DIAGNOSIS — I82401 Acute embolism and thrombosis of unspecified deep veins of right lower extremity: Secondary | ICD-10-CM

## 2014-02-23 DIAGNOSIS — I10 Essential (primary) hypertension: Secondary | ICD-10-CM

## 2014-02-23 DIAGNOSIS — D696 Thrombocytopenia, unspecified: Secondary | ICD-10-CM | POA: Diagnosis present

## 2014-02-23 DIAGNOSIS — G894 Chronic pain syndrome: Secondary | ICD-10-CM | POA: Diagnosis not present

## 2014-02-23 DIAGNOSIS — E785 Hyperlipidemia, unspecified: Secondary | ICD-10-CM | POA: Diagnosis present

## 2014-02-23 DIAGNOSIS — I82491 Acute embolism and thrombosis of other specified deep vein of right lower extremity: Secondary | ICD-10-CM | POA: Diagnosis present

## 2014-02-23 DIAGNOSIS — F3289 Other specified depressive episodes: Secondary | ICD-10-CM | POA: Diagnosis present

## 2014-02-23 DIAGNOSIS — R0989 Other specified symptoms and signs involving the circulatory and respiratory systems: Secondary | ICD-10-CM | POA: Diagnosis present

## 2014-02-23 DIAGNOSIS — Z86718 Personal history of other venous thrombosis and embolism: Secondary | ICD-10-CM

## 2014-02-23 DIAGNOSIS — K219 Gastro-esophageal reflux disease without esophagitis: Secondary | ICD-10-CM | POA: Diagnosis present

## 2014-02-23 DIAGNOSIS — E662 Morbid (severe) obesity with alveolar hypoventilation: Secondary | ICD-10-CM | POA: Diagnosis present

## 2014-02-23 DIAGNOSIS — I517 Cardiomegaly: Secondary | ICD-10-CM | POA: Diagnosis not present

## 2014-02-23 DIAGNOSIS — J9690 Respiratory failure, unspecified, unspecified whether with hypoxia or hypercapnia: Secondary | ICD-10-CM | POA: Diagnosis present

## 2014-02-23 DIAGNOSIS — F32A Depression, unspecified: Secondary | ICD-10-CM | POA: Diagnosis present

## 2014-02-23 DIAGNOSIS — F419 Anxiety disorder, unspecified: Secondary | ICD-10-CM | POA: Diagnosis present

## 2014-02-23 LAB — CBC
HCT: 38.8 % (ref 36.0–46.0)
Hemoglobin: 12.6 g/dL (ref 12.0–15.0)
MCH: 33.2 pg (ref 26.0–34.0)
MCHC: 32.5 g/dL (ref 30.0–36.0)
MCV: 102.1 fL — AB (ref 78.0–100.0)
PLATELETS: 137 10*3/uL — AB (ref 150–400)
RBC: 3.8 MIL/uL — AB (ref 3.87–5.11)
RDW: 15.1 % (ref 11.5–15.5)
WBC: 8.5 10*3/uL (ref 4.0–10.5)

## 2014-02-23 LAB — BASIC METABOLIC PANEL
Anion gap: 8 (ref 5–15)
BUN: 13 mg/dL (ref 6–23)
CALCIUM: 8.4 mg/dL (ref 8.4–10.5)
CO2: 27 mEq/L (ref 19–32)
CREATININE: 0.79 mg/dL (ref 0.50–1.10)
Chloride: 107 mEq/L (ref 96–112)
GLUCOSE: 115 mg/dL — AB (ref 70–99)
POTASSIUM: 4.6 meq/L (ref 3.7–5.3)
Sodium: 142 mEq/L (ref 137–147)

## 2014-02-23 LAB — PROTIME-INR
INR: 1.73 — ABNORMAL HIGH (ref 0.00–1.49)
Prothrombin Time: 20.3 seconds — ABNORMAL HIGH (ref 11.6–15.2)

## 2014-02-23 LAB — RAPID URINE DRUG SCREEN, HOSP PERFORMED
AMPHETAMINES: NOT DETECTED
Barbiturates: NOT DETECTED
Benzodiazepines: NOT DETECTED
Cocaine: NOT DETECTED
OPIATES: POSITIVE — AB
Tetrahydrocannabinol: NOT DETECTED

## 2014-02-23 MED ORDER — MORPHINE SULFATE 4 MG/ML IJ SOLN
4.0000 mg | Freq: Once | INTRAMUSCULAR | Status: AC
  Administered 2014-02-23: 4 mg via INTRAVENOUS
  Filled 2014-02-23: qty 1

## 2014-02-23 MED ORDER — OXYCODONE-ACETAMINOPHEN 5-325 MG PO TABS: 1.0000 | ORAL_TABLET | ORAL | Status: DC | PRN

## 2014-02-23 MED ORDER — ONDANSETRON HCL 4 MG/2ML IJ SOLN: 4.0000 mg | Freq: Four times a day (QID) | INTRAMUSCULAR | Status: DC | PRN

## 2014-02-23 MED ORDER — LORAZEPAM 1 MG PO TABS: 1.0000 mg | ORAL_TABLET | Freq: Four times a day (QID) | ORAL | Status: DC | PRN

## 2014-02-23 MED ORDER — ONDANSETRON HCL 4 MG PO TABS: 4.0000 mg | ORAL_TABLET | Freq: Four times a day (QID) | ORAL | Status: DC | PRN

## 2014-02-23 MED ORDER — ALBUTEROL SULFATE (2.5 MG/3ML) 0.083% IN NEBU
2.5000 mg | INHALATION_SOLUTION | Freq: Four times a day (QID) | RESPIRATORY_TRACT | Status: DC | PRN
  Administered 2014-02-24 – 2014-02-26 (×3): 2.5 mg via RESPIRATORY_TRACT
  Filled 2014-02-23 (×2): qty 3

## 2014-02-23 MED ORDER — ALBUTEROL SULFATE HFA 108 (90 BASE) MCG/ACT IN AERS: 2.0000 | INHALATION_SPRAY | Freq: Four times a day (QID) | RESPIRATORY_TRACT | Status: DC | PRN

## 2014-02-23 MED ORDER — SERTRALINE HCL 100 MG PO TABS
100.0000 mg | ORAL_TABLET | Freq: Every day | ORAL | Status: DC
  Administered 2014-02-24 – 2014-02-26 (×3): 100 mg via ORAL
  Filled 2014-02-23 (×3): qty 1

## 2014-02-23 MED ORDER — HYDROMORPHONE HCL 1 MG/ML IJ SOLN
1.0000 mg | INTRAMUSCULAR | Status: DC | PRN
  Administered 2014-02-23 – 2014-02-24 (×6): 1 mg via INTRAVENOUS
  Filled 2014-02-23 (×6): qty 1

## 2014-02-23 MED ORDER — POTASSIUM CHLORIDE CRYS ER 10 MEQ PO TBCR
10.0000 meq | EXTENDED_RELEASE_TABLET | Freq: Every day | ORAL | Status: DC
  Administered 2014-02-24 – 2014-02-26 (×3): 10 meq via ORAL
  Filled 2014-02-23 (×3): qty 1

## 2014-02-23 MED ORDER — WARFARIN SODIUM 7.5 MG PO TABS
7.5000 mg | ORAL_TABLET | Freq: Once | ORAL | Status: AC
  Administered 2014-02-23: 7.5 mg via ORAL
  Filled 2014-02-23: qty 1

## 2014-02-23 MED ORDER — FLUTICASONE PROPIONATE HFA 110 MCG/ACT IN AERO
2.0000 | INHALATION_SPRAY | Freq: Two times a day (BID) | RESPIRATORY_TRACT | Status: DC
  Administered 2014-02-23 – 2014-02-26 (×6): 2 via RESPIRATORY_TRACT
  Filled 2014-02-23: qty 12

## 2014-02-23 MED ORDER — PANTOPRAZOLE SODIUM 40 MG PO TBEC
40.0000 mg | DELAYED_RELEASE_TABLET | Freq: Every day | ORAL | Status: DC
  Administered 2014-02-23 – 2014-02-24 (×2): 40 mg via ORAL
  Filled 2014-02-23 (×2): qty 1

## 2014-02-23 MED ORDER — WARFARIN - PHARMACIST DOSING INPATIENT: Freq: Every day | Status: DC

## 2014-02-23 MED ORDER — ENOXAPARIN SODIUM 150 MG/ML ~~LOC~~ SOLN
1.0000 mg/kg | SUBCUTANEOUS | Status: AC
  Administered 2014-02-23: 135 mg via SUBCUTANEOUS
  Filled 2014-02-23: qty 1

## 2014-02-23 MED ORDER — FUROSEMIDE 20 MG PO TABS
20.0000 mg | ORAL_TABLET | Freq: Every day | ORAL | Status: DC
  Administered 2014-02-24 – 2014-02-26 (×3): 20 mg via ORAL
  Filled 2014-02-23 (×3): qty 1

## 2014-02-23 MED ORDER — OXYCODONE-ACETAMINOPHEN 5-325 MG PO TABS
1.0000 | ORAL_TABLET | ORAL | Status: DC | PRN
  Administered 2014-02-23 – 2014-02-24 (×3): 2 via ORAL
  Filled 2014-02-23 (×3): qty 2

## 2014-02-23 MED ORDER — ENOXAPARIN SODIUM 150 MG/ML ~~LOC~~ SOLN
135.0000 mg | Freq: Two times a day (BID) | SUBCUTANEOUS | Status: DC
  Administered 2014-02-24 – 2014-02-25 (×3): 135 mg via SUBCUTANEOUS
  Filled 2014-02-23 (×5): qty 1

## 2014-02-23 MED ORDER — TRAZODONE HCL 50 MG PO TABS
50.0000 mg | ORAL_TABLET | Freq: Every day | ORAL | Status: DC
  Administered 2014-02-23 – 2014-02-25 (×3): 50 mg via ORAL
  Filled 2014-02-23 (×4): qty 1

## 2014-02-23 MED ORDER — LORAZEPAM 1 MG PO TABS
1.0000 mg | ORAL_TABLET | Freq: Four times a day (QID) | ORAL | Status: DC | PRN
  Administered 2014-02-24 – 2014-02-26 (×5): 1 mg via ORAL
  Filled 2014-02-23 (×5): qty 1

## 2014-02-23 MED ORDER — NICOTINE 7 MG/24HR TD PT24
7.0000 mg | MEDICATED_PATCH | Freq: Once | TRANSDERMAL | Status: DC
  Administered 2014-02-23: 7 mg via TRANSDERMAL
  Filled 2014-02-23: qty 1

## 2014-02-23 NOTE — ED Provider Notes (Signed)
CSN: [id number]     Arrival date & time 02/23/14  1320 History   First MD Initiated Contact with Patient 02/23/14 1458     Chief Complaint  Patient presents with  . DVT     (Consider location/radiation/quality/duration/timing/severity/associated sxs/prior Treatment) HPI Comments: Patient presents to the emergency department with chief complaint of leg pain, swelling, and chest pain and shortness of breath. Patient was recently diagnosed with DVT and PE. She was recently discharged from the hospital, and transitioned to Coumadin. She states that in the past 3 days since her discharge from the hospital, her pain in her leg has been worsening. The swelling has been increasing. And she is becoming more short of breath and has noticed increased chest pain. She states that her leg pain is moderate to severe, and her chest pain is mild to moderate. She has tried taking percocet with minimal relief.  She denies fevers, chills, nausea, or vomiting.  The leg pain is worsened with movement and palpation.  Nothing makes the chest pain better or worse.  The history is provided by the patient. No language interpreter was used.    Past Medical History  Diagnosis Date  . Hypertension   . Asthma   . COPD (chronic obstructive pulmonary disease)   . Anxiety   . Cancer   . Colon cancer   . Dyslipidemia   . Migraine headache   . Chronic bronchitis   . Uterine fibroid   . Ovarian cyst   . GERD (gastroesophageal reflux disease)   . Morbid obesity   . Obstructive sleep apnea     mild  . Chronic pain   . Chronic back pain   . Arthritis   . Osteoarthritis   . Depression   . DVT (deep vein thrombosis) in pregnancy    Past Surgical History  Procedure Laterality Date  . Knee arthroscopy      bil.  . Carpal tunnel release      rt  . Mouth surgery      teeth extraction  . Cesarean section    . Subtotal colectomy    . Colonoscopy    . Tubal ligation     Family History  Problem Relation Age of  Onset  . Uterine cancer Mother   . Stroke Father   . Colon cancer Maternal Uncle   . Diabetes Father   . Ovarian cancer Mother   . Hypertension Father   . Breast cancer Maternal Grandmother   . Diabetes Sister   . Asthma Child   . Asthma Child    History  Substance Use Topics  . Smoking status: Current Some Day Smoker -- 1.00 packs/day for 37 years    Types: Cigarettes  . Smokeless tobacco: Never Used     Comment: trying to quit, down to .5ppd X2 years ago.   . Alcohol Use: No   OB History   Grav Para Term Preterm Abortions TAB SAB Ect Mult Living   2 2 2       2      Review of Systems  All other systems reviewed and are negative.     Allergies  Kiwi extract and Aspirin  Home Medications   Prior to Admission medications   Medication Sig Start Date End Date Taking? Authorizing Provider  albuterol (PROVENTIL HFA;VENTOLIN HFA) 108 (90 BASE) MCG/ACT inhaler Inhale 2 puffs into the lungs every 6 (six) hours as needed for wheezing or shortness of breath (shortness of breath). 02/20/14  Yes Frederich Chick  Toney Sang, MD  fluticasone (FLOVENT HFA) 110 MCG/ACT inhaler Inhale 2 puffs into the lungs every 12 (twelve) hours. 01/17/14  Yes Lorayne Marek, MD  furosemide (LASIX) 20 MG tablet Take 1 tablet (20 mg total) by mouth daily. 02/20/14  Yes Oswald Hillock, MD  LORazepam (ATIVAN) 1 MG tablet Take 1 mg by mouth every 6 (six) hours as needed for anxiety (anxiety). 01/17/14  Yes Lorayne Marek, MD  omeprazole (PRILOSEC) 40 MG capsule Take 1 capsule (40 mg total) by mouth daily. 01/17/14  Yes Lorayne Marek, MD  oxyCODONE-acetaminophen (PERCOCET/ROXICET) 5-325 MG per tablet Take 1-2 tablets by mouth every 4 (four) hours as needed for moderate pain or severe pain (leg & back pain). 02/20/14  Yes Oswald Hillock, MD  potassium chloride SA (K-DUR,KLOR-CON) 20 MEQ tablet Take 0.5 tablets (10 mEq total) by mouth daily. 02/20/14  Yes Oswald Hillock, MD  predniSONE (DELTASONE) 10 MG tablet 10-40 mg daily. Prednisone 40  mg po daily x 1 day then Prednisone 30 mg po daily x 1 day then Prednisone 20 mg po daily x 1 day then Prednisone 10 mg daily x 1 day then stop... 02/20/14  Yes Oswald Hillock, MD  sertraline (ZOLOFT) 100 MG tablet Take 1 tablet (100 mg total) by mouth daily. 01/17/14  Yes Lorayne Marek, MD  traZODone (DESYREL) 50 MG tablet Take 1 tablet (50 mg total) by mouth at bedtime. 01/17/14  Yes Lorayne Marek, MD  warfarin (COUMADIN) 5 MG tablet Take 1 tablet (5 mg total) by mouth daily. 02/20/14  Yes Oswald Hillock, MD  enoxaparin (LOVENOX) 120 MG/0.8ML injection Inject 0.8 mLs (120 mg total) into the skin every 12 (twelve) hours. 02/20/14 02/21/14  Oswald Hillock, MD   BP 132/76  Pulse 97  Temp(Src) 98 F (36.7 C) (Oral)  Resp 18  SpO2 96%  LMP 05/24/2003 Physical Exam  Nursing note and vitals reviewed. Constitutional: She is oriented to person, place, and time. She appears well-developed and well-nourished.  Morbidly obese  HENT:  Head: Normocephalic and atraumatic.  Eyes: Conjunctivae and EOM are normal. Pupils are equal, round, and reactive to light.  Neck: Normal range of motion. Neck supple.  Cardiovascular: Normal rate and regular rhythm.  Exam reveals no gallop and no friction rub.   No murmur heard. Pulmonary/Chest: Effort normal and breath sounds normal. No respiratory distress. She has no wheezes. She has no rales. She exhibits no tenderness.  Clear to auscultation bilaterally  Abdominal: Soft. She exhibits no distension and no mass. There is no tenderness. There is no rebound and no guarding.  Musculoskeletal: Normal range of motion. She exhibits no edema and no tenderness.  Right lower extremity is very swollen, tender to palpation  Range of motion strength 5/5 throughout  Neurological: She is alert and oriented to person, place, and time.  Skin: Skin is warm and dry.  No evidence of cellulitis, abscess, or other infections  Psychiatric: She has a normal mood and affect. Her behavior is  normal. Judgment and thought content normal.    ED Course  Procedures (including critical care time) Labs Review Labs Reviewed  CBC - Abnormal; Notable for the following:    RBC 3.80 (*)    MCV 102.1 (*)    Platelets 137 (*)    All other components within normal limits  PROTIME-INR - Abnormal; Notable for the following:    Prothrombin Time 20.3 (*)    INR 1.73 (*)    All other components within  normal limits  BASIC METABOLIC PANEL    Imaging Review Dg Chest 2 View  02/23/2014   CLINICAL DATA:  Shortness of breath, history of PE/DVT  EXAM: CHEST  2 VIEW  COMPARISON:  CTA chest dated 02/16/2014  FINDINGS: Interstitial markings, chronic. No focal consolidation. No pleural effusion or pneumothorax.  The heart is top-normal in size.  Degenerative changes of the visualized thoracolumbar spine.  IMPRESSION: No evidence of acute cardiopulmonary disease.   Electronically Signed   By: Julian Hy M.D.   On: 02/23/2014 14:11     EKG Interpretation None      MDM   Final diagnoses:  DVT (deep venous thrombosis), right  Pulmonary emboli    Patient with recent diagnosis of DVT and PE. Now with increased leg swelling, pain, and increased shortness of breath and chest pain. Check labs, and reassess.    Patient discussed with known PE and DVT.  Subtherapeutic INR on coumadin.  Discussed with Dr. Stevie Kern, who recommends admission for bridging.  Patient seen by Dr. Doyle Askew, who recommends increasing coumadin and discharge to home.  Dr. Doyle Askew will consult on the patient.  After consulting, Dr. Doyle Askew states that because of the multiple psycho-social issues, and the fact that patient cannot afford lovenox, patient will be admitted to the hospital until her INR is therapeutic.  Montine Circle, PA-C 02/23/14 1910

## 2014-02-23 NOTE — ED Notes (Signed)
Pt weight 296.1 Pt's height 73ft 11

## 2014-02-23 NOTE — Progress Notes (Signed)
ANTICOAGULATION CONSULT NOTE - Initial Consult  Pharmacy Consult for Lovenox, Warfarin Indication: pulmonary embolus and DVT  Allergies  Allergen Reactions  . Kiwi Extract Hives and Swelling  . Aspirin Nausea Only    Can take as long as enteric coated    Patient Measurements: Height: 4\' 11"  (149.9 cm) Weight: 296 lb (134.265 kg) IBW/kg (Calculated) : 43.2  Vital Signs: Temp: 98.7 F (37.1 C) (09/17 1531) Temp src: Oral (09/17 1325) BP: 141/75 mmHg (09/17 1700) Pulse Rate: 81 (09/17 1531)  Labs:  Recent Labs  02/23/14 1449  HGB 12.6  HCT 38.8  PLT 137*  LABPROT 20.3*  INR 1.73*  CREATININE 0.79    Estimated Creatinine Clearance: 98.7 ml/min (by C-G formula based on Cr of 0.79).   Medical History: Past Medical History  Diagnosis Date  . Hypertension   . Asthma   . COPD (chronic obstructive pulmonary disease)   . Anxiety   . Cancer   . Colon cancer   . Dyslipidemia   . Migraine headache   . Chronic bronchitis   . Uterine fibroid   . Ovarian cyst   . GERD (gastroesophageal reflux disease)   . Morbid obesity   . Obstructive sleep apnea     mild  . Chronic pain   . Chronic back pain   . Arthritis   . Osteoarthritis   . Depression   . DVT (deep vein thrombosis) in pregnancy     Medications:  Scheduled:  . nicotine  7 mg Transdermal Once  . Warfarin - Pharmacist Dosing Inpatient   Does not apply q1800   Infusions:    Assessment: 43 yoF presenting to ED on 9/17 with increasing SOB and right leg pain.  She was recently admitted (9/10 - 9/14) for DVT and PE and discharged on warfarin/Lovenox.  Most recently she has been taking warfarin 5mg  daily (since discharge on 9/14) with subtherapeutic INR of 1.7 on admission.  PMH also significant for colon cancer s/p surgery and chemo.  Pharmacy is consulted to dose Lovenox and continue warfarin dosing inpatient.  Today, 9/17   INR: 1.73, subtherapeutic  SCr 0.79, CrCl ~ 98 ml/min  CBC: Hgb 12.6, Plt 137  (low, near baseline)  Warfarin drug interactions: none  Diet: regular  Note current weight, 134 kg, is elevated from previous admission, 128 kg   Goal of Therapy:  INR 2-3 Heparin level 0.3-0.7 units/ml Monitor platelets by anticoagulation protocol: Yes   Plan:   Warfarin 7.5 mg PO once tonight at 2000  Lovenox 135 mg SubQ q12 hours  Daily INR and CBC  Continue to monitor H&H and platelets   Gretta Arab PharmD, BCPS Pager [phone] 02/23/2014 7:26 PM

## 2014-02-23 NOTE — Progress Notes (Signed)
Utilization Review completed.  Merrit Friesen RN CM  

## 2014-02-23 NOTE — ED Provider Notes (Signed)
Medical screening examination/treatment/procedure(s) were conducted as a shared visit with non-physician practitioner(s) and myself.  I personally evaluated the patient during the encounter.   EKG Interpretation None     Subtherapeutic on Coumadin with worse symptoms after recent admit for PEs/DVT so Med to be paged.  Babette Relic, MD 03/08/14 [phone]

## 2014-02-23 NOTE — ED Notes (Signed)
Bed: WA10 Expected date:  Expected time:  Means of arrival:  Comments: Hold for triage 1 

## 2014-02-23 NOTE — ED Notes (Signed)
Per pt, states she was in hospital for DVT last week-states increased SOB and right leg pain yesterday

## 2014-02-23 NOTE — Progress Notes (Signed)
  CARE MANAGEMENT ED NOTE 02/23/2014  Patient:  Brooke [name], Brooke [name]   Account Number:  [id number]  Date Initiated:  02/23/2014  Documentation initiated by:  Livia Snellen  Subjective/Objective Assessment:   Patient presents to Ed with right lower leg pain and swelling.     Subjective/Objective Assessment Detail:   Patient discharged from the hospital on 02/20/2014 for right lower extremity DVT.     Action/Plan:   Action/Plan Detail:   Anticipated DC Date:       Status Recommendation to Physician:   Result of Recommendation:    Other ED Shepherd  Other  PCP issues  Medication Assistance    Choice offered to / List presented to:            Status of service:  Completed, signed off  ED Comments:   ED Comments Detail:  EDCM spoke to patient at bedside.  Patient reports having difficulty affording her medications.  Patient does nto have Medicare part D.  Patient noted to be entered into the [facility] program and assisted with all  of her medications including 2 shots of lovenox per patient.  Patient reports she took her lovenox at home as prescribed and started on coumadin.  Patient reports she attempted to make an appointment at the [facility] for follow up, but "They didn't have an appointment until this coming Tuesday."  which is September 22.  Patient is noted to have an appointment for 09/22 at 0900am at [facility].  Patient reports she will be unable to enroll for Medicare part D until October or November.  Patient voiced her frustration regarding her health.  Patient reports, "My teeth are falling out."  [facility] provided patient with free upcoming dental clinic.  [facility] offered patient support.  No further EDCM needs at this time.

## 2014-02-23 NOTE — ED Notes (Signed)
Pt right leg is swollen with pitting edema.

## 2014-02-23 NOTE — H&P (Signed)
[facility] History and Physical  Brooke [name] WFU:[id number] DOB: 1956/03/25 DOA: 02/23/2014  Referring physician: ED physician PCP: Lorayne Marek, MD   Chief Complaint: worsening shortness of breath   HPI: Pt is 58 yo female who was recently discharged three days ago (after being hospitalized for DVT, PE), discharged on Lovenox 120 mg BID for two days to complete 5 days bridging therapy, also discharged on coumadin 5 mg tablet which pt says she took earlier today prior to this admission. She presented to day with main concern of persistent shortness of breath present at rest and with exertion, pain with deep breathing. She denies fevers, chills, no abd or urinary concerns. In ED, INR 1.73 and [facility] asked to admit for further evaluation. Pt says she did no go to see PCP today as she had no transportation. She is otherwise stable in ED, VSS, blood work unremarkable, CXR with no acute findings.   Assessment and Plan: Active Problems: Acute respiratory failure - secondary to known PE, imposed on OHS - admit to medical floor - pharmacy consult for assistance with coumadin and Lovenox dosing - no oxygen needed at this time - will check UDS DVT - Lovenox and Coumadin per pharmacy -  provide analgesia as needed  Depression - continue Zoloft HTN - slightly above target range - continue to monitor   Radiological Exams on Admission: Dg Chest 2 View  02/23/2014   CLINICAL DATA:  Shortness of breath, history of PE/DVT  EXAM: CHEST  2 VIEW  COMPARISON:  CTA chest dated 02/16/2014  FINDINGS: Interstitial markings, chronic. No focal consolidation. No pleural effusion or pneumothorax.  The heart is top-normal in size.  Degenerative changes of the visualized thoracolumbar spine.  IMPRESSION: No evidence of acute cardiopulmonary disease.   Electronically Signed   By: Julian Hy M.D.   On: 02/23/2014 14:11    Code Status: Full Family Communication: Pt at bedside Disposition Plan: Admit  for further evaluation     Review of Systems:  Constitutional: Negative for fever, chills and malaise/fatigue. Negative for diaphoresis.  HENT: Negative for hearing loss, ear pain, nosebleeds, congestion, sore throat, neck pain, tinnitus and ear discharge.   Eyes: Negative for blurred vision, double vision, photophobia, pain, discharge and redness.  Respiratory: Negative for cough, hemoptysis, sputum production Cardiovascular: Negative for chest pain, palpitations, orthopnea, claudication and leg swelling.  Gastrointestinal: Negative for nausea, vomiting and abdominal pain. Genitourinary: Negative for dysuria, urgency, frequency, hematuria and flank pain.  Musculoskeletal: Negative for myalgias, back pain, joint pain and falls.  Skin: Negative for itching and rash.  Neurological: Negative for dizziness and weakness.  Endo/Heme/Allergies: Negative for environmental allergies and polydipsia. Does not bruise/bleed easily.  Psychiatric/Behavioral: Negative for suicidal ideas. The patient is not nervous/anxious.      Past Medical History  Diagnosis Date  . Hypertension   . Asthma   . COPD (chronic obstructive pulmonary disease)   . Anxiety   . Cancer   . Colon cancer   . Dyslipidemia   . Migraine headache   . Chronic bronchitis   . Uterine fibroid   . Ovarian cyst   . GERD (gastroesophageal reflux disease)   . Morbid obesity   . Obstructive sleep apnea     mild  . Chronic pain   . Chronic back pain   . Arthritis   . Osteoarthritis   . Depression   . DVT (deep vein thrombosis) in pregnancy     Past Surgical History  Procedure Laterality Date  .  Knee arthroscopy      bil.  . Carpal tunnel release      rt  . Mouth surgery      teeth extraction  . Cesarean section    . Subtotal colectomy    . Colonoscopy    . Tubal ligation      Social History:  reports that she has been smoking Cigarettes.  She has a 37 pack-year smoking history. She has never used smokeless tobacco.  She reports that she does not drink alcohol or use illicit drugs.  Allergies  Allergen Reactions  . Kiwi Extract Hives and Swelling  . Aspirin Nausea Only    Can take as long as enteric coated    Family History  Problem Relation Age of Onset  . Uterine cancer Mother   . Stroke Father   . Colon cancer Maternal Uncle   . Diabetes Father   . Ovarian cancer Mother   . Hypertension Father   . Breast cancer Maternal Grandmother   . Diabetes Sister   . Asthma Child   . Asthma Child     Prior to Admission medications   Medication Sig Start Date End Date Taking? Authorizing Provider  albuterol (PROVENTIL HFA;VENTOLIN HFA) 108 (90 BASE) MCG/ACT inhaler Inhale 2 puffs into the lungs every 6 (six) hours as needed for wheezing or shortness of breath (shortness of breath). 02/20/14  Yes Oswald Hillock, MD  fluticasone (FLOVENT HFA) 110 MCG/ACT inhaler Inhale 2 puffs into the lungs every 12 (twelve) hours. 01/17/14  Yes Lorayne Marek, MD  furosemide (LASIX) 20 MG tablet Take 1 tablet (20 mg total) by mouth daily. 02/20/14  Yes Oswald Hillock, MD  LORazepam (ATIVAN) 1 MG tablet Take 1 mg by mouth every 6 (six) hours as needed for anxiety (anxiety). 01/17/14  Yes Lorayne Marek, MD  omeprazole (PRILOSEC) 40 MG capsule Take 1 capsule (40 mg total) by mouth daily. 01/17/14  Yes Lorayne Marek, MD  oxyCODONE-acetaminophen (PERCOCET/ROXICET) 5-325 MG per tablet Take 1-2 tablets by mouth every 4 (four) hours as needed for moderate pain or severe pain (leg & back pain). 02/20/14  Yes Oswald Hillock, MD  potassium chloride SA (K-DUR,KLOR-CON) 20 MEQ tablet Take 0.5 tablets (10 mEq total) by mouth daily. 02/20/14  Yes Oswald Hillock, MD  predniSONE (DELTASONE) 10 MG tablet 10-40 mg daily. Prednisone 40 mg po daily x 1 day then Prednisone 30 mg po daily x 1 day then Prednisone 20 mg po daily x 1 day then Prednisone 10 mg daily x 1 day then stop... 02/20/14  Yes Oswald Hillock, MD  sertraline (ZOLOFT) 100 MG tablet Take 1  tablet (100 mg total) by mouth daily. 01/17/14  Yes Lorayne Marek, MD  traZODone (DESYREL) 50 MG tablet Take 1 tablet (50 mg total) by mouth at bedtime. 01/17/14  Yes Lorayne Marek, MD  warfarin (COUMADIN) 5 MG tablet Take 1 tablet (5 mg total) by mouth daily. 02/20/14  Yes Oswald Hillock, MD  enoxaparin (LOVENOX) 120 MG/0.8ML injection Inject 0.8 mLs (120 mg total) into the skin every 12 (twelve) hours. 02/20/14 02/21/14  Oswald Hillock, MD    Physical Exam: Filed Vitals:   02/23/14 1700 02/23/14 1705 02/23/14 1746 02/23/14 1800  BP: 141/75  134/73   Pulse:   70   Temp:   98.1 F (36.7 C)   TempSrc:   Oral   Resp: 14  16   Height:    4\' 11"  (1.499 m)  Weight:  134.265 kg (296 lb)  SpO2:  97% 92%     Physical Exam  Constitutional: Appears well-developed and well-nourished. No distress.  HENT: Normocephalic. External right and left ear normal. Oropharynx is clear and moist.  Eyes: Conjunctivae and EOM are normal. PERRLA, no scleral icterus.  Neck: Normal ROM. Neck supple. No JVD. No tracheal deviation. No thyromegaly.  CVS: RRR, S1/S2 +, no murmurs, no gallops, no carotid bruit.  Pulmonary: Effort and breath sounds normal, no stridor, rhonchi, wheezes, rales.  Abdominal: Soft. BS +,  no distension, tenderness, rebound or guarding.  Musculoskeletal: Normal range of motion. No edema and no tenderness.  Lymphadenopathy: No lymphadenopathy noted, cervical, inguinal. Neuro: Alert. Normal reflexes, muscle tone coordination. No cranial nerve deficit. Skin: Skin is warm and dry. No rash noted. Not diaphoretic. No erythema. No pallor.  Psychiatric: Normal mood and affect. Behavior, judgment, thought content normal.   Labs on Admission:  Basic Metabolic Panel:  Recent Labs Lab 02/16/14 2024 02/23/14 1449  NA 139 142  K 3.8 4.6  CL 96 107  CO2 26 27  GLUCOSE 99 115*  BUN 24* 13  CREATININE 1.10 0.79  CALCIUM 9.6 8.4   Liver Function Tests: No results found for this basename: AST, ALT,  ALKPHOS, BILITOT, PROT, ALBUMIN,  in the last 168 hours No results found for this basename: LIPASE, AMYLASE,  in the last 168 hours No results found for this basename: AMMONIA,  in the last 168 hours CBC:  Recent Labs Lab 02/16/14 2024 02/18/14 0621 02/19/14 0430 02/20/14 0432 02/23/14 1449  WBC 11.2* 14.0* 15.3* 13.3* 8.5  NEUTROABS 7.1  --   --   --   --   HGB 14.7 12.9 12.6 12.3 12.6  HCT 44.6 39.5 38.8 39.1 38.8  MCV 98.9 100.3* 99.2 102.4* 102.1*  PLT 137* 124* 125* 111* 137*   Cardiac Enzymes:  Recent Labs Lab 02/16/14 2024 02/17/14 0258 02/17/14 0913  TROPONINI <0.30 <0.30 <0.30   BNP: No components found with this basename: POCBNP,  CBG: No results found for this basename: GLUCAP,  in the last 168 hours  EKG: Normal sinus rhythm, no ST/T wave changes  Faye Ramsay, MD  [facility] Pager [phone]  If 7PM-7AM, please contact night-coverage [email or web address] Password [facility] 02/23/2014, 6:42 PM

## 2014-02-23 NOTE — ED Notes (Signed)
Pt c/o of right leg pain 10/10 and has pitting edema to right lower leg. Recent d/c from hospital for DVT and reports SOB today.

## 2014-02-23 NOTE — Progress Notes (Signed)
Paged Dr. Doyle Askew to discuss admission status.

## 2014-02-24 ENCOUNTER — Inpatient Hospital Stay (HOSPITAL_COMMUNITY): Payer: Medicare Other

## 2014-02-24 ENCOUNTER — Encounter (HOSPITAL_COMMUNITY): Payer: Self-pay | Admitting: Radiology

## 2014-02-24 DIAGNOSIS — I2699 Other pulmonary embolism without acute cor pulmonale: Secondary | ICD-10-CM | POA: Diagnosis present

## 2014-02-24 DIAGNOSIS — I10 Essential (primary) hypertension: Secondary | ICD-10-CM

## 2014-02-24 DIAGNOSIS — I82491 Acute embolism and thrombosis of other specified deep vein of right lower extremity: Secondary | ICD-10-CM | POA: Diagnosis present

## 2014-02-24 DIAGNOSIS — I517 Cardiomegaly: Secondary | ICD-10-CM | POA: Diagnosis not present

## 2014-02-24 DIAGNOSIS — F17200 Nicotine dependence, unspecified, uncomplicated: Secondary | ICD-10-CM

## 2014-02-24 LAB — CBC
HCT: 39.3 % (ref 36.0–46.0)
Hemoglobin: 12.3 g/dL (ref 12.0–15.0)
MCH: 32.5 pg (ref 26.0–34.0)
MCHC: 31.3 g/dL (ref 30.0–36.0)
MCV: 104 fL — ABNORMAL HIGH (ref 78.0–100.0)
Platelets: 149 10*3/uL — ABNORMAL LOW (ref 150–400)
RBC: 3.78 MIL/uL — ABNORMAL LOW (ref 3.87–5.11)
RDW: 15.2 % (ref 11.5–15.5)
WBC: 9.7 10*3/uL (ref 4.0–10.5)

## 2014-02-24 LAB — BASIC METABOLIC PANEL WITH GFR
Anion gap: 10 (ref 5–15)
BUN: 16 mg/dL (ref 6–23)
CO2: 27 meq/L (ref 19–32)
Calcium: 8.4 mg/dL (ref 8.4–10.5)
Chloride: 105 meq/L (ref 96–112)
Creatinine, Ser: 0.91 mg/dL (ref 0.50–1.10)
GFR calc Af Amer: 80 mL/min — ABNORMAL LOW
GFR calc non Af Amer: 69 mL/min — ABNORMAL LOW
Glucose, Bld: 102 mg/dL — ABNORMAL HIGH (ref 70–99)
Potassium: 4.1 meq/L (ref 3.7–5.3)
Sodium: 142 meq/L (ref 137–147)

## 2014-02-24 LAB — PROTIME-INR
INR: 1.78 — AB (ref 0.00–1.49)
PROTHROMBIN TIME: 20.7 s — AB (ref 11.6–15.2)

## 2014-02-24 MED ORDER — OXYCODONE-ACETAMINOPHEN 5-325 MG PO TABS
1.0000 | ORAL_TABLET | ORAL | Status: DC | PRN
  Administered 2014-02-24 – 2014-02-26 (×8): 2 via ORAL
  Filled 2014-02-24 (×8): qty 2

## 2014-02-24 MED ORDER — PANTOPRAZOLE SODIUM 20 MG PO TBEC
20.0000 mg | DELAYED_RELEASE_TABLET | Freq: Once | ORAL | Status: AC
  Administered 2014-02-24: 20 mg via ORAL
  Filled 2014-02-24: qty 1

## 2014-02-24 MED ORDER — IOHEXOL 350 MG/ML SOLN
100.0000 mL | Freq: Once | INTRAVENOUS | Status: AC | PRN
  Administered 2014-02-24: 100 mL via INTRAVENOUS

## 2014-02-24 MED ORDER — NICOTINE 21 MG/24HR TD PT24
21.0000 mg | MEDICATED_PATCH | Freq: Every day | TRANSDERMAL | Status: DC
  Administered 2014-02-24 – 2014-02-26 (×3): 21 mg via TRANSDERMAL
  Filled 2014-02-24 (×4): qty 1

## 2014-02-24 MED ORDER — WARFARIN SODIUM 10 MG PO TABS
10.0000 mg | ORAL_TABLET | Freq: Once | ORAL | Status: AC
  Administered 2014-02-24: 10 mg via ORAL
  Filled 2014-02-24: qty 1

## 2014-02-24 MED ORDER — HYDROMORPHONE HCL 1 MG/ML IJ SOLN
0.5000 mg | INTRAMUSCULAR | Status: DC | PRN
  Administered 2014-02-24 – 2014-02-26 (×7): 0.5 mg via INTRAVENOUS
  Filled 2014-02-24 (×7): qty 1

## 2014-02-24 MED ORDER — PANTOPRAZOLE SODIUM 40 MG PO TBEC
60.0000 mg | DELAYED_RELEASE_TABLET | Freq: Every day | ORAL | Status: DC
  Administered 2014-02-25 – 2014-02-26 (×2): 60 mg via ORAL
  Filled 2014-02-24 (×2): qty 1

## 2014-02-24 MED ORDER — CALCIUM CARBONATE ANTACID 500 MG PO CHEW
1000.0000 mg | CHEWABLE_TABLET | Freq: Two times a day (BID) | ORAL | Status: DC | PRN
  Filled 2014-02-24: qty 2

## 2014-02-24 MED ORDER — INFLUENZA VAC SPLIT QUAD 0.5 ML IM SUSY
0.5000 mL | PREFILLED_SYRINGE | INTRAMUSCULAR | Status: DC
  Filled 2014-02-24 (×3): qty 0.5

## 2014-02-24 MED ORDER — WARFARIN - PHARMACIST DOSING INPATIENT: Freq: Every day | Status: DC

## 2014-02-24 MED ORDER — GI COCKTAIL ~~LOC~~
30.0000 mL | Freq: Once | ORAL | Status: AC
  Administered 2014-02-24: 30 mL via ORAL
  Filled 2014-02-24: qty 30

## 2014-02-24 NOTE — Progress Notes (Addendum)
PROGRESS NOTE    Brooke [name] VZD:[id number] DOB: 05/06/1956 DOA: 02/23/2014 PCP: Lorayne Marek, MD  HPI/Brief narrative 58 year old female patient with history of asthma/? COPD, OSA-has appointment for repeat sleep study on 9/25, colon cancer status post surgery and chemotherapy 1998-states that she is seen an oncologist at the [place] but no records, morbid obesity, tobacco abuse, hypertension, prior LUE DVT-unsure if provoked or not, recent admission 9/10-9/14 for extensive RLE DVT, bilateral PE and asthma exacerbation. She completed Lovenox bridge and claims compliance with Coumadin. Since discharge, she continued to have progressively worsening pain and swelling in RLE and on 9/17 woke up with worsening dyspnea and? Palpitations. She does have chronic dyspnea at baseline but this was worse than usual. She denied chest pain, dizziness, lightheadedness or syncope. In the ED, INR was 1.73.   Assessment/Plan:  1. Recent right lower extremity extensive DVT and bilateral PE: She completed initial Lovenox bridging and was transitioned to Coumadin which she claims compliance to. However INR was subtherapeutic on this admission. Restarted on full dose Lovenox until INR therapeutic greater than 2. Patient complained of worsening dyspnea and hence obtained a repeat CTA chest due to concern for new PE-PE stable compared to prior exam, no new PE and no right heart strain noted. 2-D echo: Mild LVH, LVEF 50-55% and normal RV systolic function. Elevate right lower extremity. No features to suggest vascular compromise in right lower extremity. Monitor closely. No hypoxia on admission.  2. Hypertension: Controlled. 3. Tobacco abuse: Cessation counseled. Patient requests nicotine patch. 4. Mild thrombocytopenia: unclear etiology. Improving. 5. OSA/asthma/? COPD: Tobacco cessation counseled. Stable. States that she has an appointment on 9/25 to repeat sleep study for possible CPAP. Recently completed  steroid taper. No clinical bronchospasm-will not restart steroids. 6. Morbid obesity 7. History of colon cancer status post surgery and chemotherapy: Although patient states that she has been seen at the cancer center, multiple calls to the [facility] and they're extensive review suggested that she has not been seen at the [facility]. Patient is a poor historian. Due to current VTE in the context of prior colon cancer and prior DVT, concern for underlying malignancy - workup of which may need to be pursued as outpatient.  8. History of anxiety and depression: Stable. Continue home medications 9. History of chronic pain: Judicious use of pain medications.   Code Status: Full Family Communication: None at bedside. Disposition Plan:  transfer to telemetry.   Consultants:  None  Procedures:  None  Antibiotics:  None   Subjective: RLE pain & swelling. No CP, mild chronic SOB. Occasional palpitations- none today.  Objective: Filed Vitals:   02/23/14 2021 02/23/14 2141 02/24/14 0502 02/24/14 1212  BP: 145/81  122/62 120/68  Pulse: 67  72 74  Temp: 98.1 F (36.7 C)  97.5 F (36.4 C) 97.8 F (36.6 C)  TempSrc: Oral  Oral Oral  Resp: 14  16 20   Height: 4\' 11"  (1.499 m)     Weight: 134.265 kg (296 lb)     SpO2: 97% 95% 99% 98%    Intake/Output Summary (Last 24 hours) at 02/24/14 1338 Last data filed at 02/24/14 1014  Gross per 24 hour  Intake    720 ml  Output      0 ml  Net    720 ml   Filed Weights   02/23/14 1800 02/23/14 2021  Weight: 134.265 kg (296 lb) 134.265 kg (296 lb)     Exam:  General exam: Moderately built  and morbidly obese female lying comfortably propped up in bed. Respiratory system: Clear. No increased work of breathing. Cardiovascular system: S1 & S2 heard, RRR. No JVD, murmurs, gallops, clicks. Right lower extremity almost 1.5 times left lower extremity-diffuse swelling but no tenderness or warmth. Normal temperature and color. Difficult to  palpate distal pulses secondary to significant swelling but felt feebly. Able to move toes without difficulty. Gastrointestinal system: Abdomen is nondistended, soft and nontender. Normal bowel sounds heard. Central nervous system: Alert and oriented. No focal neurological deficits. Extremities: Symmetric 5 x 5 power.   Data Reviewed: Basic Metabolic Panel:  Recent Labs Lab 02/23/14 1449 02/24/14 0355  NA 142 142  K 4.6 4.1  CL 107 105  CO2 27 27  GLUCOSE 115* 102*  BUN 13 16  CREATININE 0.79 0.91  CALCIUM 8.4 8.4   Liver Function Tests: No results found for this basename: AST, ALT, ALKPHOS, BILITOT, PROT, ALBUMIN,  in the last 168 hours No results found for this basename: LIPASE, AMYLASE,  in the last 168 hours No results found for this basename: AMMONIA,  in the last 168 hours CBC:  Recent Labs Lab 02/18/14 0621 02/19/14 0430 02/20/14 0432 02/23/14 1449 02/24/14 0355  WBC 14.0* 15.3* 13.3* 8.5 9.7  HGB 12.9 12.6 12.3 12.6 12.3  HCT 39.5 38.8 39.1 38.8 39.3  MCV 100.3* 99.2 102.4* 102.1* 104.0*  PLT 124* 125* 111* 137* 149*   Cardiac Enzymes: No results found for this basename: CKTOTAL, CKMB, CKMBINDEX, TROPONINI,  in the last 168 hours BNP (last 3 results)  Recent Labs  04/18/13 2220 01/10/14 1728  PROBNP 171.5* 66.7   CBG: No results found for this basename: GLUCAP,  in the last 168 hours  No results found for this or any previous visit (from the past 240 hour(s)).        Studies: Dg Chest 2 View  02/23/2014   CLINICAL DATA:  Shortness of breath, history of PE/DVT  EXAM: CHEST  2 VIEW  COMPARISON:  CTA chest dated 02/16/2014  FINDINGS: Interstitial markings, chronic. No focal consolidation. No pleural effusion or pneumothorax.  The heart is top-normal in size.  Degenerative changes of the visualized thoracolumbar spine.  IMPRESSION: No evidence of acute cardiopulmonary disease.   Electronically Signed   By: Julian Hy M.D.   On: 02/23/2014  14:11   Ct Angio Chest Pe W/cm &/or Wo Cm  02/24/2014   CLINICAL DATA:  Worsening shortness of Breath, history of pulmonary embolism  EXAM: CT ANGIOGRAPHY CHEST WITH CONTRAST  TECHNIQUE: Multidetector CT imaging of the chest was performed using the standard protocol during bolus administration of intravenous contrast. Multiplanar CT image reconstructions and MIPs were obtained to evaluate the vascular anatomy.  CONTRAST:  130mL OMNIPAQUE IOHEXOL 350 MG/ML SOLN  COMPARISON:  02/16/2014  FINDINGS: The lungs are again well aerated without focal infiltrate or sizable effusion. No parenchymal nodule or focal mass lesion is seen. No significant hilar or mediastinal adenopathy is noted.  There are bilateral pulmonary emboli identified similar to that seen on the prior exam no new significant pulmonary emboli are seen. The RV to LV ratio is within normal limits. No right heart strain is seen. Coronary calcifications are noted.  Visualized portions of the upper abdomen reveal no acute abnormality. No acute bony abnormality is seen  Review of the MIP images confirms the above findings.  IMPRESSION: Bilateral pulmonary emboli, relatively stable from the prior exam. No new significant emboli are seen. No right heart strain is  noted.   Electronically Signed   By: Inez Catalina M.D.   On: 02/24/2014 12:18        Scheduled Meds: . enoxaparin (LOVENOX) injection  135 mg Subcutaneous Q12H  . fluticasone  2 puff Inhalation Q12H  . furosemide  20 mg Oral Daily  . [START ON 02/25/2014] Influenza vac split quadrivalent PF  0.5 mL Intramuscular Tomorrow-1000  . nicotine  21 mg Transdermal Daily  . pantoprazole  40 mg Oral Daily  . potassium chloride SA  10 mEq Oral Daily  . sertraline  100 mg Oral Daily  . traZODone  50 mg Oral QHS  . Warfarin - Pharmacist Dosing Inpatient   Does not apply q1800  . [DISCONTINUED] nicotine  7 mg Transdermal Once   Continuous Infusions:   Active Problems:   Respiratory  failure    Time spent: 56 minutes    Burrell Hodapp, MD, FACP, FHM. [facility] Pager [phone]  If 7PM-7AM, please contact night-coverage [email or web address] Password [email or web address] 02/24/2014, 1:38 PM    LOS: 1 day

## 2014-02-24 NOTE — Progress Notes (Signed)
Echo Lab  2D Echocardiogram completed.  [place], RDCS 02/24/2014 12:01 PM

## 2014-02-24 NOTE — Progress Notes (Signed)
ANTICOAGULATION CONSULT NOTE - Follow up  Pharmacy Consult for Lovenox, Warfarin Indication: pulmonary embolus and DVT  Allergies  Allergen Reactions  . Kiwi Extract Hives and Swelling  . Aspirin Nausea Only    Can take as long as enteric coated   Patient Measurements: Height: 4\' 11"  (149.9 cm) Weight: 296 lb (134.265 kg) IBW/kg (Calculated) : 43.2  Vital Signs: Temp: 97.5 F (36.4 C) (09/18 0502) Temp src: Oral (09/18 0502) BP: 122/62 mmHg (09/18 0502) Pulse Rate: 72 (09/18 0502)  Labs:  Recent Labs  02/23/14 1449 02/24/14 0355  HGB 12.6 12.3  HCT 38.8 39.3  PLT 137* 149*  LABPROT 20.3* 20.7*  INR 1.73* 1.78*  CREATININE 0.79 0.91   Estimated Creatinine Clearance: 86.7 ml/min (by C-G formula based on Cr of 0.91).  Medications:  Scheduled:  . enoxaparin (LOVENOX) injection  135 mg Subcutaneous Q12H  . fluticasone  2 puff Inhalation Q12H  . furosemide  20 mg Oral Daily  . [START ON 02/25/2014] Influenza vac split quadrivalent PF  0.5 mL Intramuscular Tomorrow-1000  . nicotine  7 mg Transdermal Once  . pantoprazole  40 mg Oral Daily  . potassium chloride SA  10 mEq Oral Daily  . sertraline  100 mg Oral Daily  . traZODone  50 mg Oral QHS   Assessment: 34 yoF presenting to ED on 9/17 with increasing SOB and right leg pain.  She was recently admitted (9/10 - 9/14) for DVT and PE and discharged on warfarin/Lovenox.  Most recently she has been taking warfarin 5mg  daily (since discharge on 9/14) with subtherapeutic INR of 1.73 on admission.  PMH also significant for colon cancer s/p surgery and chemo.  Pharmacy is consulted to dose Lovenox and continue warfarin dosing inpatient.  Today, 9/18   INR: 1.78, subtherapeutic after 7.5mg  Warfarin  CBC: Hgb 12.3, Plt 149 (low, near baseline-improved)  Warfarin drug interactions: none  Diet: regular  Note current weight, 134 kg, is elevated from previous admission, 128 kg  Goal of Therapy:  INR 2-3 Heparin level  0.3-0.7 units/ml Monitor platelets by anticoagulation protocol: Yes   Plan:   Warfarin 10 mg PO this am  Continue Lovenox 135 mg SubQ q12 hours  Daily INR and CBC  Continue to monitor H&H and platelets  Minda Ditto PharmD Pager [phone] 02/24/2014, 8:36 AM

## 2014-02-24 NOTE — Progress Notes (Signed)
Pt arrived to unit from 3W in w/c. Amb self to bed w/ indep, steady gait. Pt oriented to callbell and environment, POC discussed. Pt c/o generalized pain, medicated w/ IV dilaudid as ordered. Will monitor for effect. VSS, tele box 39 confirmed w/ CCMD.

## 2014-02-25 DIAGNOSIS — G894 Chronic pain syndrome: Secondary | ICD-10-CM

## 2014-02-25 LAB — PROTIME-INR
INR: 2.92 — ABNORMAL HIGH (ref 0.00–1.49)
Prothrombin Time: 30.5 seconds — ABNORMAL HIGH (ref 11.6–15.2)

## 2014-02-25 LAB — CBC
HEMATOCRIT: 42.5 % (ref 36.0–46.0)
Hemoglobin: 13.1 g/dL (ref 12.0–15.0)
MCH: 32.3 pg (ref 26.0–34.0)
MCHC: 30.8 g/dL (ref 30.0–36.0)
MCV: 104.7 fL — AB (ref 78.0–100.0)
Platelets: 159 10*3/uL (ref 150–400)
RBC: 4.06 MIL/uL (ref 3.87–5.11)
RDW: 15.3 % (ref 11.5–15.5)
WBC: 8.2 10*3/uL (ref 4.0–10.5)

## 2014-02-25 LAB — GLUCOSE, CAPILLARY: Glucose-Capillary: 103 mg/dL — ABNORMAL HIGH (ref 70–99)

## 2014-02-25 MED ORDER — WARFARIN SODIUM 2.5 MG PO TABS
2.5000 mg | ORAL_TABLET | Freq: Once | ORAL | Status: AC
  Administered 2014-02-25: 2.5 mg via ORAL
  Filled 2014-02-25: qty 1

## 2014-02-25 NOTE — Progress Notes (Signed)
PROGRESS NOTE    Brooke [name] VHQ:[id number] DOB: 1955/06/22 DOA: 02/23/2014 PCP: Lorayne Marek, MD  HPI/Brief narrative 58 year old female patient with history of asthma/? COPD, OSA-has appointment for repeat sleep study on 9/25, colon cancer status post surgery and chemotherapy 1998-states that she is seen an oncologist at the [place] but no records, morbid obesity, tobacco abuse, hypertension, prior LUE DVT-unsure if provoked or not, recent admission 9/10-9/14 for extensive RLE DVT, bilateral PE and asthma exacerbation. She completed Lovenox bridge and claims compliance with Coumadin. Since discharge, she continued to have progressively worsening pain and swelling in RLE and on 9/17 woke up with worsening dyspnea and? Palpitations. She does have chronic dyspnea at baseline but this was worse than usual. She denied chest pain, dizziness, lightheadedness or syncope. In the ED, INR was 1.73.   Assessment/Plan:  1. Recent right lower extremity extensive DVT and bilateral PE: She completed initial Lovenox bridging and was transitioned to Coumadin which she claims compliance to. However INR was subtherapeutic on this admission. Restarted on full dose Lovenox until INR therapeutic greater than 2. Patient complained of worsening dyspnea and hence obtained a repeat CTA chest due to concern for new PE-PE stable compared to prior exam, no new PE and no right heart strain noted. 2-D echo: Mild LVH, LVEF 50-55% and normal RV systolic function. Elevate right lower extremity. No features to suggest vascular compromise in right lower extremity. Monitor closely. No hypoxia on admission. Right leg swelling improving. INR has jumped from 1.2 > 2.9. DC Lovenox. Monitor additional 24 hours to ensure INR is reasonable prior to DC. 2. Hypertension: Controlled. 3. Tobacco abuse: Cessation counseled. Patient requests nicotine patch. 4. Mild thrombocytopenia: unclear etiology. Resolved. 5. OSA/asthma/? COPD:  Tobacco cessation counseled. Stable. States that she has an appointment on 9/25 to repeat sleep study for possible CPAP. Recently completed steroid taper. No clinical bronchospasm-will not restart steroids. 6. Morbid obesity 7. History of colon cancer status post surgery and chemotherapy: Although patient states that she has been seen at the cancer center, multiple calls to the [place] and they're extensive review suggested that she has not been seen at the [facility]. Patient is a poor historian. Due to current VTE in the context of prior colon cancer and prior DVT, concern for underlying malignancy - workup of which may need to be pursued as outpatient.  8. History of anxiety and depression: Stable. Continue home medications 9. History of chronic pain: Judicious use of pain medications. States that she sees pain M.D. on [facility].   Code Status: Full Family Communication: None at bedside. Disposition Plan: DC telemetry. Possible DC home 9/20  Consultants:  None  Procedures:  None  Antibiotics:  None   Subjective: RLE pain & swelling-decreasing. No CP, dyspnea or palpitations.??? Right upper tooth pain.  Objective: Filed Vitals:   02/24/14 2113 02/25/14 0540 02/25/14 0844 02/25/14 1347  BP: 147/81 131/86  108/66  Pulse: 79 74  77  Temp: 97.9 F (36.6 C) 97.7 F (36.5 C)  97.8 F (36.6 C)  TempSrc: Oral Oral  Oral  Resp: 19 18  18   Height:      Weight:      SpO2: 100% 100% 95% 99%    Intake/Output Summary (Last 24 hours) at 02/25/14 1410 Last data filed at 02/25/14 1038  Gross per 24 hour  Intake    480 ml  Output      0 ml  Net    480 ml  Filed Weights   02/23/14 1800 02/23/14 2021  Weight: 134.265 kg (296 lb) 134.265 kg (296 lb)     Exam:  General exam: Moderately built and morbidly obese female sitting comfortably on chair. Respiratory system: Clear. No increased work of breathing. Cardiovascular system: S1 & S2 heard, RRR. No JVD,  murmurs, gallops, clicks. Right lower extremity almost 1.5 times left lower extremity-diffuse swelling but no tenderness or warmth. Normal temperature and color. Difficult to palpate distal pulses secondary to significant swelling but felt feebly. Able to move toes without difficulty. Swelling decreasing. Telemetry: Sinus rhythm. Gastrointestinal system: Abdomen is nondistended, soft and nontender. Normal bowel sounds heard. Central nervous system: Alert and oriented. No focal neurological deficits. Extremities: Symmetric 5 x 5 power. ENT: Multiple missing tooth.?? Dental caries of right upper second molar. No acute findings.   Data Reviewed: Basic Metabolic Panel:  Recent Labs Lab 02/23/14 1449 02/24/14 0355  NA 142 142  K 4.6 4.1  CL 107 105  CO2 27 27  GLUCOSE 115* 102*  BUN 13 16  CREATININE 0.79 0.91  CALCIUM 8.4 8.4   Liver Function Tests: No results found for this basename: AST, ALT, ALKPHOS, BILITOT, PROT, ALBUMIN,  in the last 168 hours No results found for this basename: LIPASE, AMYLASE,  in the last 168 hours No results found for this basename: AMMONIA,  in the last 168 hours CBC:  Recent Labs Lab 02/19/14 0430 02/20/14 0432 02/23/14 1449 02/24/14 0355 02/25/14 0530  WBC 15.3* 13.3* 8.5 9.7 8.2  HGB 12.6 12.3 12.6 12.3 13.1  HCT 38.8 39.1 38.8 39.3 42.5  MCV 99.2 102.4* 102.1* 104.0* 104.7*  PLT 125* 111* 137* 149* 159   Cardiac Enzymes: No results found for this basename: CKTOTAL, CKMB, CKMBINDEX, TROPONINI,  in the last 168 hours BNP (last 3 results)  Recent Labs  04/18/13 2220 01/10/14 1728  PROBNP 171.5* 66.7   CBG: No results found for this basename: GLUCAP,  in the last 168 hours  No results found for this or any previous visit (from the past 240 hour(s)).        Studies: Ct Angio Chest Pe W/cm &/or Wo Cm  02/24/2014   CLINICAL DATA:  Worsening shortness of Breath, history of pulmonary embolism  EXAM: CT ANGIOGRAPHY CHEST WITH CONTRAST   TECHNIQUE: Multidetector CT imaging of the chest was performed using the standard protocol during bolus administration of intravenous contrast. Multiplanar CT image reconstructions and MIPs were obtained to evaluate the vascular anatomy.  CONTRAST:  143mL OMNIPAQUE IOHEXOL 350 MG/ML SOLN  COMPARISON:  02/16/2014  FINDINGS: The lungs are again well aerated without focal infiltrate or sizable effusion. No parenchymal nodule or focal mass lesion is seen. No significant hilar or mediastinal adenopathy is noted.  There are bilateral pulmonary emboli identified similar to that seen on the prior exam no new significant pulmonary emboli are seen. The RV to LV ratio is within normal limits. No right heart strain is seen. Coronary calcifications are noted.  Visualized portions of the upper abdomen reveal no acute abnormality. No acute bony abnormality is seen  Review of the MIP images confirms the above findings.  IMPRESSION: Bilateral pulmonary emboli, relatively stable from the prior exam. No new significant emboli are seen. No right heart strain is noted.   Electronically Signed   By: Inez Catalina M.D.   On: 02/24/2014 12:18        Scheduled Meds: . fluticasone  2 puff Inhalation Q12H  . furosemide  20 mg  Oral Daily  . Influenza vac split quadrivalent PF  0.5 mL Intramuscular Tomorrow-1000  . nicotine  21 mg Transdermal Daily  . pantoprazole  60 mg Oral Daily  . potassium chloride SA  10 mEq Oral Daily  . sertraline  100 mg Oral Daily  . traZODone  50 mg Oral QHS  . warfarin  2.5 mg Oral ONCE-1800  . 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 [facility] Pager [phone]  If 7PM-7AM, please contact night-coverage [email or web address] Password [email or web address] 02/25/2014, 2:10 PM    LOS: 2 days

## 2014-02-25 NOTE — Progress Notes (Addendum)
ANTICOAGULATION CONSULT NOTE - Follow up  Pharmacy Consult for Lovenox, Warfarin Indication: pulmonary embolus and DVT  Allergies  Allergen Reactions  . Kiwi Extract Hives and Swelling  . Aspirin Nausea Only    Can take as long as enteric coated   Patient Measurements: Height: 4\' 11"  (149.9 cm) Weight: 296 lb (134.265 kg) IBW/kg (Calculated) : 43.2  Vital Signs: Temp: 97.7 F (36.5 C) (09/19 0540) Temp src: Oral (09/19 0540) BP: 131/86 mmHg (09/19 0540) Pulse Rate: 74 (09/19 0540)  Labs:  Recent Labs  02/23/14 1449 02/24/14 0355 02/25/14 0530  HGB 12.6 12.3 13.1  HCT 38.8 39.3 42.5  PLT 137* 149* 159  LABPROT 20.3* 20.7* 30.5*  INR 1.73* 1.78* 2.92*  CREATININE 0.79 0.91  --    Estimated Creatinine Clearance: 86.7 ml/min (by C-G formula based on Cr of 0.91).  Medications:  Scheduled:  . enoxaparin (LOVENOX) injection  135 mg Subcutaneous Q12H  . fluticasone  2 puff Inhalation Q12H  . furosemide  20 mg Oral Daily  . Influenza vac split quadrivalent PF  0.5 mL Intramuscular Tomorrow-1000  . nicotine  21 mg Transdermal Daily  . pantoprazole  60 mg Oral Daily  . potassium chloride SA  10 mEq Oral Daily  . sertraline  100 mg Oral Daily  . traZODone  50 mg Oral QHS  . Warfarin - Pharmacist Dosing Inpatient   Does not apply q1800   Assessment: 54 yoF presenting to ED on 9/17 with increasing SOB and right leg pain.  She was recently admitted (9/10 - 9/14) for DVT and PE and discharged on warfarin/Lovenox.  Most recently she has been taking warfarin 5mg  daily (since discharge on 9/14) with subtherapeutic INR of 1.73 on admission.  PMH also significant for colon cancer s/p surgery and chemo.  Pharmacy is consulted to dose Lovenox and continue warfarin dosing inpatient.  Today, 9/19  INR now therapeutic and rising quickly from yesterday  CBC stable  Warfarin drug interactions: none  No reported bleeding  Diet: regular  Note current weight, 134 kg, is elevated  from previous admission, 128 kg  Inpatient Warfarin Doses: 9/17: 7.5mg  9/18: 10mg   Goal of Therapy:  INR 2-3 Heparin level 0.3-0.7 units/ml Monitor platelets by anticoagulation protocol: Yes   Plan:   Due to significant rise in INR over 24hr, will reduce warfarin dose to 2.5mg  tonight at 6pm  Discontinue Lovenox with INR therapeutic  Daily INR and CBC  Continue to monitor H&H and platelets  If discharged, would recommend to restart 5mg  daily starting tomorrow with close INR follow up next week   Adrian Saran, PharmD, BCPS Pager [phone] 02/25/2014 8:33 AM'

## 2014-02-26 LAB — CBC
HEMATOCRIT: 36.6 % (ref 36.0–46.0)
HEMOGLOBIN: 11.5 g/dL — AB (ref 12.0–15.0)
MCH: 32.7 pg (ref 26.0–34.0)
MCHC: 31.4 g/dL (ref 30.0–36.0)
MCV: 104 fL — ABNORMAL HIGH (ref 78.0–100.0)
Platelets: 157 10*3/uL (ref 150–400)
RBC: 3.52 MIL/uL — AB (ref 3.87–5.11)
RDW: 15.4 % (ref 11.5–15.5)
WBC: 8.1 10*3/uL (ref 4.0–10.5)

## 2014-02-26 LAB — PROTIME-INR
INR: 2.29 — ABNORMAL HIGH (ref 0.00–1.49)
Prothrombin Time: 25.2 seconds — ABNORMAL HIGH (ref 11.6–15.2)

## 2014-02-26 MED ORDER — WARFARIN SODIUM 5 MG PO TABS
5.0000 mg | ORAL_TABLET | Freq: Once | ORAL | Status: DC
  Filled 2014-02-26: qty 1

## 2014-02-26 NOTE — Discharge Summary (Signed)
Physician Discharge Summary  Brooke [name] OIZ:[id number] DOB: [birth year] DOA: 02/23/2014  PCP: Lorayne Marek, MD  Admit date: 02/23/2014 Discharge date: 02/26/2014  Time spent: Less than 30 minutes  Recommendations for Outpatient Follow-up:  1. Dr. Lorayne Marek, PCP at [facility] on 02/28/14 at 9 AM. To be seen with repeat labs (CBC, PT & INR). Coumadin dose to be adjusted. 2. Recommend outpatient hematology/oncology consultation for evaluation of prothrombotic state. 3. Continue followup with outpatient pain clinic.  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  Filed Weights   02/23/14 1800 02/23/14 2021  Weight: 134.265 kg (296 lb) 134.265 kg (296 lb)    History of present illness:  58 year old female patient with history of asthma/? COPD, OSA-has appointment for repeat sleep study on 9/25, colon cancer status post surgery and chemotherapy 1998-states that she is seen an oncologist at the [address] but no records, morbid obesity, tobacco abuse, hypertension, prior LUE DVT-unsure if provoked or not, recent admission 9/10-9/14 for extensive RLE DVT, bilateral PE and asthma exacerbation. She completed Lovenox bridge and claims compliance with Coumadin. Since discharge, she continued to have progressively worsening pain and swelling in RLE and on 9/17 woke up with worsening dyspnea and? Palpitations. She does have chronic dyspnea at baseline but this was worse than usual. She denied chest pain, dizziness, lightheadedness or syncope. In  the ED, INR was 1.73.  Hospital Course:   1. Recent right lower extremity extensive DVT and bilateral PE: She completed initial Lovenox bridging after recent DC and was transitioned to Coumadin which she claims compliance to. However INR was subtherapeutic on this admission. Restarted on full dose Lovenox until INR therapeutic greater than 2. Patient complained of worsening dyspnea and hence obtained a repeat CTA chest due to concern for new PE-PE stable compared to prior exam, no new PE and no right heart strain noted. 2-D echo: Mild LVH, LVEF 50-55% and normal RV systolic function. Elevate right lower extremity. No features to suggest vascular compromise in right lower extremity. Monitor closely. No hypoxia on admission. Right lower extremity swelling slowly decreasing. Will need at least 6 months anticoagulation and maybe longer given prior history of DVT-defer decision to outpatient MDs/hematology consultation. 2. Hypertension: Controlled. 3. Tobacco abuse: Cessation counseled. Patient declines nicotine patch at discharge. 4. Mild thrombocytopenia: unclear etiology. Resolved 5. OSA/asthma/? COPD: Tobacco cessation counseled. Stable. States that she has an appointment on 9/25 to repeat sleep study for possible CPAP. Recently completed steroid taper. No clinical bronchospasm-did not restart steroids. 6. Morbid obesity 7. History of colon cancer status post surgery and chemotherapy: Although patient states that she has been seen at the cancer center, multiple calls to the [facility] and they're extensive review suggested that she has not been seen at the [facility]. Patient is a poor historian. Due to current VTE in the context of prior colon cancer and prior DVT, there is concern for underlying malignancy - workup of which may need to be pursued as outpatient. Recommend outpatient hematology/oncology consultation. Strong family history of colon cancer. 8. History  of anxiety and depression: Stable.  Continue home medications 9. History of chronic pain: Judicious use of pain medications. Follows with pain clinic outpatient. 10. Anemia:? Lab error. No reported bleeding. Followup CBCs as outpatient in 2 days.   Consultations:  None  Procedures:  None    Discharge Exam:  Complaints:  Right leg swelling & pain progressively decreasing. Denies chest pain or dyspnea. No further toothache reported.  Filed Vitals:   02/25/14 2005 02/25/14 2215 02/26/14 0441 02/26/14 0754  BP:  127/64 130/97   Pulse:  77 82   Temp:  97.9 F (36.6 C) 97.5 F (36.4 C)   TempSrc:  Oral Oral   Resp:  20 16   Height:      Weight:      SpO2: 98% 97% 100% 97%   General exam: Moderately built and morbidly obese female lying comfortably in bed. Respiratory system: Clear. No increased work of breathing.  Cardiovascular system: S1 & S2 heard, RRR. No JVD, murmurs, gallops, clicks. Right lower extremity almost 1.5 times left lower extremity-diffuse swelling but no tenderness or warmth. Normal temperature and color. Difficult to palpate distal pulses secondary to significant swelling but felt feebly. Able to move toes without difficulty. Swelling continues to decrease. Gastrointestinal system: Abdomen is nondistended, soft and nontender. Normal bowel sounds heard.  Central nervous system: Alert and oriented. No focal neurological deficits.  Extremities: Symmetric 5 x 5 power.  ENT: Multiple missing tooth.?? Dental caries of right upper second molar. No acute findings.   Discharge Instructions      Discharge Instructions   Call MD for:  difficulty breathing, headache or visual disturbances    Complete by:  As directed      Call MD for:  severe uncontrolled pain    Complete by:  As directed      Diet - low sodium heart healthy    Complete by:  As directed      Discharge instructions    Complete by:  As directed   Elevate right leg when sitting or lying down.     Increase activity slowly    Complete  by:  As directed             Medication List    STOP taking these medications       enoxaparin 120 MG/0.8ML injection  Commonly known as:  LOVENOX     predniSONE 10 MG tablet  Commonly known as:  DELTASONE      TAKE these medications       albuterol 108 (90 BASE) MCG/ACT inhaler  Commonly known as:  PROVENTIL HFA;VENTOLIN HFA  Inhale 2 puffs into the lungs every 6 (six) hours as needed for wheezing or shortness of breath (shortness of breath).     fluticasone 110 MCG/ACT inhaler  Commonly known as:  FLOVENT HFA  Inhale 2 puffs into the lungs every 12 (twelve) hours.     furosemide 20 MG tablet  Commonly known as:  LASIX  Take 1 tablet (20 mg total) by mouth daily.     LORazepam 1 MG tablet  Commonly known as:  ATIVAN  Take 1 mg by mouth every 6 (six) hours as needed for anxiety (anxiety).     omeprazole 40 MG capsule  Commonly known as:  PRILOSEC  Take 1 capsule (40 mg total) by mouth daily.     oxyCODONE-acetaminophen 5-325 MG per tablet  Commonly known as:  PERCOCET/ROXICET  Take 1-2 tablets by mouth every 4 (four) hours as  needed for moderate pain or severe pain (leg & back pain).     potassium chloride SA 20 MEQ tablet  Commonly known as:  K-DUR,KLOR-CON  Take 0.5 tablets (10 mEq total) by mouth daily.     sertraline 100 MG tablet  Commonly known as:  ZOLOFT  Take 1 tablet (100 mg total) by mouth daily.     traZODone 50 MG tablet  Commonly known as:  DESYREL  Take 1 tablet (50 mg total) by mouth at bedtime.     warfarin 5 MG tablet  Commonly known as:  COUMADIN  Take 1 tablet (5 mg total) by mouth daily.       Follow-up Information   Follow up with Lorayne Marek, MD On 02/28/2014. (Keep prior appointment at 9 AM. To be seen with repeat labs (CBC, PT & INR). Coumadin dose to be adjusted as needed.)    Specialty:  Internal Medicine   Contact information:   [address] [address] [phone]        The results of  significant diagnostics from this hospitalization (including imaging, microbiology, ancillary and laboratory) are listed below for reference.    Significant Diagnostic Studies: Dg Chest 2 View  02/23/2014   CLINICAL DATA:  Shortness of breath, history of PE/DVT  EXAM: CHEST  2 VIEW  COMPARISON:  CTA chest dated 02/16/2014  FINDINGS: Interstitial markings, chronic. No focal consolidation. No pleural effusion or pneumothorax.  The heart is top-normal in size.  Degenerative changes of the visualized thoracolumbar spine.  IMPRESSION: No evidence of acute cardiopulmonary disease.   Electronically Signed   By: Julian Hy M.D.   On: 02/23/2014 14:11   Dg Chest 2 View  02/12/2014   CLINICAL DATA:  Cough and wheezing for 3 days. History of asthma, hypertension, COPD and colon cancer. History of smoking.  EXAM: CHEST  2 VIEW  COMPARISON:  01/30/2014; 04/15/2012  FINDINGS: Grossly unchanged enlarged cardiac silhouette and mediastinal contours. Evaluation of the retrosternal clear space is obscured secondary to overlying soft tissues. The lungs are hyperexpanded with unchanged mild diffuse slightly nodular thickening of the pulmonary interstitium. No focal airspace opacities. No pleural effusion or pneumothorax. No evidence of edema. Surgical clips are seen within the left upper abdominal quadrant. Unchanged bones.  IMPRESSION: Mild lung hyperexpansion and bronchitic change without acute cardiopulmonary disease.   Electronically Signed   By: Sandi Mariscal M.D.   On: 02/12/2014 14:08   Ct Angio Chest Pe W/cm &/or Wo Cm  02/24/2014   CLINICAL DATA:  Worsening shortness of Breath, history of pulmonary embolism  EXAM: CT ANGIOGRAPHY CHEST WITH CONTRAST  TECHNIQUE: Multidetector CT imaging of the chest was performed using the standard protocol during bolus administration of intravenous contrast. Multiplanar CT image reconstructions and MIPs were obtained to evaluate the vascular anatomy.  CONTRAST:  156mL OMNIPAQUE  IOHEXOL 350 MG/ML SOLN  COMPARISON:  02/16/2014  FINDINGS: The lungs are again well aerated without focal infiltrate or sizable effusion. No parenchymal nodule or focal mass lesion is seen. No significant hilar or mediastinal adenopathy is noted.  There are bilateral pulmonary emboli identified similar to that seen on the prior exam no new significant pulmonary emboli are seen. The RV to LV ratio is within normal limits. No right heart strain is seen. Coronary calcifications are noted.  Visualized portions of the upper abdomen reveal no acute abnormality. No acute bony abnormality is seen  Review of the MIP images confirms the above findings.  IMPRESSION: Bilateral  pulmonary emboli, relatively stable from the prior exam. No new significant emboli are seen. No right heart strain is noted.   Electronically Signed   By: Inez Catalina M.D.   On: 02/24/2014 12:18   Ct Angio Chest Pe W/cm &/or Wo Cm  02/16/2014   CLINICAL DATA:  Shortness of breath.  History of DVTs.  Chest pain.  EXAM: CT ANGIOGRAPHY CHEST WITH CONTRAST  TECHNIQUE: Multidetector CT imaging of the chest was performed using the standard protocol during bolus administration of intravenous contrast. Multiplanar CT image reconstructions and MIPs were obtained to evaluate the vascular anatomy.  CONTRAST:  100 mL Omnipaque 350  COMPARISON:  None.  FINDINGS: Technically adequate study with good opacification of the central and segmental pulmonary arteries. Focal nonocclusive filling defects are demonstrated in the distal right main pulmonary artery, extending into lower lobe branches and middle lobe branches. Occlusive filling defects demonstrated in left lower lobe proximal segmental branches. Changes are consistent with acute pulmonary embolus. RV to LV ratio is normal. No evidence of right heart strain.  Normal heart size. Normal caliber thoracic aorta. Coronary artery calcifications. Great vessel origins are patent. Esophagus is decompressed. No  significant lymphadenopathy in the chest.  Lungs are clear. No focal airspace disease or consolidation. No pneumothorax. No pleural effusions. Airways appear patent.  Included portions of the upper abdominal organs are grossly unremarkable. Degenerative changes in the spine. No destructive bone lesions.  Review of the MIP images confirms the above findings.  IMPRESSION: Positive for bilateral lower lobe segmental pulmonary emboli.  These results were called by telephone at the time of interpretation on 02/16/2014 at 9:26 pm to Dr. Johnney Killian, who verbally acknowledged these results.   Electronically Signed   By: Lucienne Capers M.D.   On: 02/16/2014 21:30   Dg Chest Portable 1 View  02/14/2014   CLINICAL DATA:  Severe shortness of breath. History of asthma, COPD, colon cancer and hypertension  EXAM: PORTABLE CHEST - 1 VIEW  COMPARISON:  02/12/2014; 01/10/2014; 04/20/2013  FINDINGS: Examination is degraded due to patient body habitus and portable technique.  Grossly unchanged enlarged cardiac silhouette and mediastinal contours. There is grossly unchanged mild diffuse slightly nodular thickening of the pulmonary interstitium. Veiling opacities overlying the bilateral lower lungs are favored to represent overlying breast tissues. No discrete focal airspace opacities. No pleural effusion or pneumothorax. No evidence of edema. No acute osseus abnormalities. At least moderate degenerative change of the bilateral glenohumeral joints is suspected though incompletely evaluated.  IMPRESSION: Findings suggestive of airways disease / bronchitis. No focal airspace opacities to suggest pneumonia on this AP portable examination.   Electronically Signed   By: Sandi Mariscal M.D.   On: 02/14/2014 16:50    Microbiology: No results found for this or any previous visit (from the past 240 hour(s)).   Labs: Basic Metabolic Panel:  Recent Labs Lab 02/23/14 1449 02/24/14 0355  NA 142 142  K 4.6 4.1  CL 107 105  CO2 27 27   GLUCOSE 115* 102*  BUN 13 16  CREATININE 0.79 0.91  CALCIUM 8.4 8.4   Liver Function Tests: No results found for this basename: AST, ALT, ALKPHOS, BILITOT, PROT, ALBUMIN,  in the last 168 hours No results found for this basename: LIPASE, AMYLASE,  in the last 168 hours No results found for this basename: AMMONIA,  in the last 168 hours CBC:  Recent Labs Lab 02/20/14 0432 02/23/14 1449 02/24/14 0355 02/25/14 0530 02/26/14 0548  WBC 13.3* 8.5 9.7 8.2  8.1  HGB 12.3 12.6 12.3 13.1 11.5*  HCT 39.1 38.8 39.3 42.5 36.6  MCV 102.4* 102.1* 104.0* 104.7* 104.0*  PLT 111* 137* 149* 159 157   Cardiac Enzymes: No results found for this basename: CKTOTAL, CKMB, CKMBINDEX, TROPONINI,  in the last 168 hours BNP: BNP (last 3 results)  Recent Labs  04/18/13 2220 01/10/14 1728  PROBNP 171.5* 66.7   CBG:  Recent Labs Lab 02/25/14 2216  GLUCAP 103*     Signed:  Johnchristopher Sarvis, MD, FACP, FHM. [facility] Pager [phone]  If 7PM-7AM, please contact night-coverage [email or web address] Password [email or web address] 02/26/2014, 3:41 PM

## 2014-02-26 NOTE — Progress Notes (Signed)
ANTICOAGULATION CONSULT NOTE - Follow up  Pharmacy Consult for Lovenox, Warfarin Indication: pulmonary embolus and DVT  Allergies  Allergen Reactions  . Kiwi Extract Hives and Swelling  . Aspirin Nausea Only    Can take as long as enteric coated   Patient Measurements: Height: 4\' 11"  (149.9 cm) Weight: 296 lb (134.265 kg) IBW/kg (Calculated) : 43.2  Vital Signs: Temp: 97.5 F (36.4 C) (09/20 0441) Temp src: Oral (09/20 0441) BP: 130/97 mmHg (09/20 0441) Pulse Rate: 82 (09/20 0441)  Labs:  Recent Labs  02/23/14 1449 02/24/14 0355 02/25/14 0530 02/26/14 0548  HGB 12.6 12.3 13.1 11.5*  HCT 38.8 39.3 42.5 36.6  PLT 137* 149* 159 157  LABPROT 20.3* 20.7* 30.5* 25.2*  INR 1.73* 1.78* 2.92* 2.29*  CREATININE 0.79 0.91  --   --    Estimated Creatinine Clearance: 86.7 ml/min (by C-G formula based on Cr of 0.91).  Medications:  Scheduled:  . fluticasone  2 puff Inhalation Q12H  . furosemide  20 mg Oral Daily  . Influenza vac split quadrivalent PF  0.5 mL Intramuscular Tomorrow-1000  . nicotine  21 mg Transdermal Daily  . pantoprazole  60 mg Oral Daily  . potassium chloride SA  10 mEq Oral Daily  . sertraline  100 mg Oral Daily  . traZODone  50 mg Oral QHS  . Warfarin - Pharmacist Dosing Inpatient   Does not apply q1800   Assessment: 35 yoF presenting to ED on 9/17 with increasing SOB and right leg pain.  She was recently admitted (9/10 - 9/14) for DVT and PE and discharged on warfarin/Lovenox.  Most recently she has been taking warfarin 5mg  daily (since discharge on 9/14) with subtherapeutic INR of 1.73 on admission.  PMH also significant for colon cancer s/p surgery and chemo.  Pharmacy is consulted to dose Lovenox and continue warfarin dosing inpatient.  Today, 9/20  INR therapeutic  CBC stable  Warfarin drug interactions: none  No reported bleeding  Diet: regular  Note current weight, 134 kg, is elevated from previous admission, 128 kg  Inpatient Warfarin  Doses: 9/17: 7.5mg  9/18: 10mg  9/19: 2.5mg   Goal of Therapy:  INR 2-3 Heparin level 0.3-0.7 units/ml Monitor platelets by anticoagulation protocol: Yes   Plan:  1) Will try restart of patient's previous warfarin home dose - 5mg  today 2) If discharged, recommend warfarin 5mg  once daily with close INR follow up  Adrian Saran, PharmD, BCPS Pager [phone] 02/26/2014 8:22 AM'

## 2014-02-26 NOTE — Progress Notes (Deleted)
Physician Discharge Summary  Brooke [name] XTG:[id number] DOB: 1955/10/02 DOA: 02/23/2014  PCP: Lorayne Marek, MD  Admit date: 02/23/2014 Discharge date: 02/26/2014  Time spent: Less than 30 minutes  Recommendations for Outpatient Follow-up:  1. Dr. Lorayne Marek, PCP at [facility] on 02/28/14 at 9 AM. To be seen with repeat labs (CBC, PT & INR). Coumadin dose to be adjusted. 2. Recommend outpatient hematology/oncology consultation for evaluation of prothrombotic state. 3. Continue followup with outpatient pain clinic.  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  Filed Weights   02/23/14 1800 02/23/14 2021  Weight: 134.265 kg (296 lb) 134.265 kg (296 lb)    History of present illness:  58 year old female patient with history of asthma/? COPD, OSA-has appointment for repeat sleep study on 9/25, colon cancer status post surgery and chemotherapy 1998-states that she is seen an oncologist at the [facility] but no records, morbid obesity, tobacco abuse, hypertension, prior LUE DVT-unsure if provoked or not, recent admission 9/10-9/14 for extensive RLE DVT, bilateral PE and asthma exacerbation. She completed Lovenox bridge and claims compliance with Coumadin. Since discharge, she continued to have progressively worsening pain and swelling in RLE and on 9/17 woke up with worsening dyspnea and? Palpitations. She does have chronic dyspnea at baseline but this was worse than usual. She denied chest pain, dizziness, lightheadedness or syncope. In  the ED, INR was 1.73.  Hospital Course:   1. Recent right lower extremity extensive DVT and bilateral PE: She completed initial Lovenox bridging after recent DC and was transitioned to Coumadin which she claims compliance to. However INR was subtherapeutic on this admission. Restarted on full dose Lovenox until INR therapeutic greater than 2. Patient complained of worsening dyspnea and hence obtained a repeat CTA chest due to concern for new PE-PE stable compared to prior exam, no new PE and no right heart strain noted. 2-D echo: Mild LVH, LVEF 50-55% and normal RV systolic function. Elevate right lower extremity. No features to suggest vascular compromise in right lower extremity. Monitor closely. No hypoxia on admission. Right lower extremity swelling slowly decreasing. Will need at least 6 months anticoagulation and maybe longer given prior history of DVT-defer decision to outpatient MDs/hematology consultation. 2. Hypertension: Controlled. 3. Tobacco abuse: Cessation counseled. Patient declines nicotine patch at discharge. 4. Mild thrombocytopenia: unclear etiology. Resolved 5. OSA/asthma/? COPD: Tobacco cessation counseled. Stable. States that she has an appointment on 9/25 to repeat sleep study for possible CPAP. Recently completed steroid taper. No clinical bronchospasm-did not restart steroids. 6. Morbid obesity 7. History of colon cancer status post surgery and chemotherapy: Although patient states that she has been seen at the cancer center, multiple calls to the [facility] and they're extensive review suggested that she has not been seen at the [facility]. Patient is a poor historian. Due to current VTE in the context of prior colon cancer and prior DVT, there is concern for underlying malignancy - workup of which may need to be pursued as outpatient. Recommend outpatient hematology/oncology consultation. Strong family history of colon cancer. 8. History  of anxiety and depression: Stable.  Continue home medications 9. History of chronic pain: Judicious use of pain medications. Follows with pain clinic outpatient. 10. Anemia:? Lab error. No reported bleeding. Followup CBCs as outpatient in 2 days.   Consultations:  None  Procedures:  None    Discharge Exam:  Complaints:  Right leg swelling & pain progressively decreasing. Denies chest pain or dyspnea. No further toothache reported.  Filed Vitals:   02/25/14 2005 02/25/14 2215 02/26/14 0441 02/26/14 0754  BP:  127/64 130/97   Pulse:  77 82   Temp:  97.9 F (36.6 C) 97.5 F (36.4 C)   TempSrc:  Oral Oral   Resp:  20 16   Height:      Weight:      SpO2: 98% 97% 100% 97%   General exam: Moderately built and morbidly obese female lying comfortably in bed. Respiratory system: Clear. No increased work of breathing.  Cardiovascular system: S1 & S2 heard, RRR. No JVD, murmurs, gallops, clicks. Right lower extremity almost 1.5 times left lower extremity-diffuse swelling but no tenderness or warmth. Normal temperature and color. Difficult to palpate distal pulses secondary to significant swelling but felt feebly. Able to move toes without difficulty. Swelling continues to decrease. Gastrointestinal system: Abdomen is nondistended, soft and nontender. Normal bowel sounds heard.  Central nervous system: Alert and oriented. No focal neurological deficits.  Extremities: Symmetric 5 x 5 power.  ENT: Multiple missing tooth.?? Dental caries of right upper second molar. No acute findings.   Discharge Instructions      Discharge Instructions   Call MD for:  difficulty breathing, headache or visual disturbances    Complete by:  As directed      Call MD for:  severe uncontrolled pain    Complete by:  As directed      Diet - low sodium heart healthy    Complete by:  As directed      Discharge instructions    Complete by:  As directed   Elevate right leg when sitting or lying down.     Increase activity slowly    Complete  by:  As directed             Medication List    STOP taking these medications       enoxaparin 120 MG/0.8ML injection  Commonly known as:  LOVENOX     predniSONE 10 MG tablet  Commonly known as:  DELTASONE      TAKE these medications       albuterol 108 (90 BASE) MCG/ACT inhaler  Commonly known as:  PROVENTIL HFA;VENTOLIN HFA  Inhale 2 puffs into the lungs every 6 (six) hours as needed for wheezing or shortness of breath (shortness of breath).     fluticasone 110 MCG/ACT inhaler  Commonly known as:  FLOVENT HFA  Inhale 2 puffs into the lungs every 12 (twelve) hours.     furosemide 20 MG tablet  Commonly known as:  LASIX  Take 1 tablet (20 mg total) by mouth daily.     LORazepam 1 MG tablet  Commonly known as:  ATIVAN  Take 1 mg by mouth every 6 (six) hours as needed for anxiety (anxiety).     omeprazole 40 MG capsule  Commonly known as:  PRILOSEC  Take 1 capsule (40 mg total) by mouth daily.     oxyCODONE-acetaminophen 5-325 MG per tablet  Commonly known as:  PERCOCET/ROXICET  Take 1-2 tablets by mouth every 4 (four) hours as  needed for moderate pain or severe pain (leg & back pain).     potassium chloride SA 20 MEQ tablet  Commonly known as:  K-DUR,KLOR-CON  Take 0.5 tablets (10 mEq total) by mouth daily.     sertraline 100 MG tablet  Commonly known as:  ZOLOFT  Take 1 tablet (100 mg total) by mouth daily.     traZODone 50 MG tablet  Commonly known as:  DESYREL  Take 1 tablet (50 mg total) by mouth at bedtime.     warfarin 5 MG tablet  Commonly known as:  COUMADIN  Take 1 tablet (5 mg total) by mouth daily.       Follow-up Information   Follow up with Lorayne Marek, MD On 02/28/2014. (Keep prior appointment at 9 AM. To be seen with repeat labs (CBC, PT & INR). Coumadin dose to be adjusted as needed.)    Specialty:  Internal Medicine   Contact information:   [place] [address] [phone]        The results of  significant diagnostics from this hospitalization (including imaging, microbiology, ancillary and laboratory) are listed below for reference.    Significant Diagnostic Studies: Dg Chest 2 View  02/23/2014   CLINICAL DATA:  Shortness of breath, history of PE/DVT  EXAM: CHEST  2 VIEW  COMPARISON:  CTA chest dated 02/16/2014  FINDINGS: Interstitial markings, chronic. No focal consolidation. No pleural effusion or pneumothorax.  The heart is top-normal in size.  Degenerative changes of the visualized thoracolumbar spine.  IMPRESSION: No evidence of acute cardiopulmonary disease.   Electronically Signed   By: Julian Hy M.D.   On: 02/23/2014 14:11   Dg Chest 2 View  02/12/2014   CLINICAL DATA:  Cough and wheezing for 3 days. History of asthma, hypertension, COPD and colon cancer. History of smoking.  EXAM: CHEST  2 VIEW  COMPARISON:  01/30/2014; 04/15/2012  FINDINGS: Grossly unchanged enlarged cardiac silhouette and mediastinal contours. Evaluation of the retrosternal clear space is obscured secondary to overlying soft tissues. The lungs are hyperexpanded with unchanged mild diffuse slightly nodular thickening of the pulmonary interstitium. No focal airspace opacities. No pleural effusion or pneumothorax. No evidence of edema. Surgical clips are seen within the left upper abdominal quadrant. Unchanged bones.  IMPRESSION: Mild lung hyperexpansion and bronchitic change without acute cardiopulmonary disease.   Electronically Signed   By: Sandi Mariscal M.D.   On: 02/12/2014 14:08   Ct Angio Chest Pe W/cm &/or Wo Cm  02/24/2014   CLINICAL DATA:  Worsening shortness of Breath, history of pulmonary embolism  EXAM: CT ANGIOGRAPHY CHEST WITH CONTRAST  TECHNIQUE: Multidetector CT imaging of the chest was performed using the standard protocol during bolus administration of intravenous contrast. Multiplanar CT image reconstructions and MIPs were obtained to evaluate the vascular anatomy.  CONTRAST:  169mL OMNIPAQUE  IOHEXOL 350 MG/ML SOLN  COMPARISON:  02/16/2014  FINDINGS: The lungs are again well aerated without focal infiltrate or sizable effusion. No parenchymal nodule or focal mass lesion is seen. No significant hilar or mediastinal adenopathy is noted.  There are bilateral pulmonary emboli identified similar to that seen on the prior exam no new significant pulmonary emboli are seen. The RV to LV ratio is within normal limits. No right heart strain is seen. Coronary calcifications are noted.  Visualized portions of the upper abdomen reveal no acute abnormality. No acute bony abnormality is seen  Review of the MIP images confirms the above findings.  IMPRESSION: Bilateral  pulmonary emboli, relatively stable from the prior exam. No new significant emboli are seen. No right heart strain is noted.   Electronically Signed   By: Inez Catalina M.D.   On: 02/24/2014 12:18   Ct Angio Chest Pe W/cm &/or Wo Cm  02/16/2014   CLINICAL DATA:  Shortness of breath.  History of DVTs.  Chest pain.  EXAM: CT ANGIOGRAPHY CHEST WITH CONTRAST  TECHNIQUE: Multidetector CT imaging of the chest was performed using the standard protocol during bolus administration of intravenous contrast. Multiplanar CT image reconstructions and MIPs were obtained to evaluate the vascular anatomy.  CONTRAST:  100 mL Omnipaque 350  COMPARISON:  None.  FINDINGS: Technically adequate study with good opacification of the central and segmental pulmonary arteries. Focal nonocclusive filling defects are demonstrated in the distal right main pulmonary artery, extending into lower lobe branches and middle lobe branches. Occlusive filling defects demonstrated in left lower lobe proximal segmental branches. Changes are consistent with acute pulmonary embolus. RV to LV ratio is normal. No evidence of right heart strain.  Normal heart size. Normal caliber thoracic aorta. Coronary artery calcifications. Great vessel origins are patent. Esophagus is decompressed. No  significant lymphadenopathy in the chest.  Lungs are clear. No focal airspace disease or consolidation. No pneumothorax. No pleural effusions. Airways appear patent.  Included portions of the upper abdominal organs are grossly unremarkable. Degenerative changes in the spine. No destructive bone lesions.  Review of the MIP images confirms the above findings.  IMPRESSION: Positive for bilateral lower lobe segmental pulmonary emboli.  These results were called by telephone at the time of interpretation on 02/16/2014 at 9:26 pm to Dr. Johnney Killian, who verbally acknowledged these results.   Electronically Signed   By: Lucienne Capers M.D.   On: 02/16/2014 21:30   Dg Chest Portable 1 View  02/14/2014   CLINICAL DATA:  Severe shortness of breath. History of asthma, COPD, colon cancer and hypertension  EXAM: PORTABLE CHEST - 1 VIEW  COMPARISON:  02/12/2014; 01/10/2014; 04/20/2013  FINDINGS: Examination is degraded due to patient body habitus and portable technique.  Grossly unchanged enlarged cardiac silhouette and mediastinal contours. There is grossly unchanged mild diffuse slightly nodular thickening of the pulmonary interstitium. Veiling opacities overlying the bilateral lower lungs are favored to represent overlying breast tissues. No discrete focal airspace opacities. No pleural effusion or pneumothorax. No evidence of edema. No acute osseus abnormalities. At least moderate degenerative change of the bilateral glenohumeral joints is suspected though incompletely evaluated.  IMPRESSION: Findings suggestive of airways disease / bronchitis. No focal airspace opacities to suggest pneumonia on this AP portable examination.   Electronically Signed   By: Sandi Mariscal M.D.   On: 02/14/2014 16:50    Microbiology: No results found for this or any previous visit (from the past 240 hour(s)).   Labs: Basic Metabolic Panel:  Recent Labs Lab 02/23/14 1449 02/24/14 0355  NA 142 142  K 4.6 4.1  CL 107 105  CO2 27 27   GLUCOSE 115* 102*  BUN 13 16  CREATININE 0.79 0.91  CALCIUM 8.4 8.4   Liver Function Tests: No results found for this basename: AST, ALT, ALKPHOS, BILITOT, PROT, ALBUMIN,  in the last 168 hours No results found for this basename: LIPASE, AMYLASE,  in the last 168 hours No results found for this basename: AMMONIA,  in the last 168 hours CBC:  Recent Labs Lab 02/20/14 0432 02/23/14 1449 02/24/14 0355 02/25/14 0530 02/26/14 0548  WBC 13.3* 8.5 9.7 8.2  8.1  HGB 12.3 12.6 12.3 13.1 11.5*  HCT 39.1 38.8 39.3 42.5 36.6  MCV 102.4* 102.1* 104.0* 104.7* 104.0*  PLT 111* 137* 149* 159 157   Cardiac Enzymes: No results found for this basename: CKTOTAL, CKMB, CKMBINDEX, TROPONINI,  in the last 168 hours BNP: BNP (last 3 results)  Recent Labs  04/18/13 2220 01/10/14 1728  PROBNP 171.5* 66.7   CBG:  Recent Labs Lab 02/25/14 2216  GLUCAP 103*     Signed:  Ulice Follett, MD, FACP, FHM. [facility] Pager [phone]  If 7PM-7AM, please contact night-coverage [email or web address] Password [facility] 02/26/2014, 11:37 AM

## 2014-02-26 NOTE — Progress Notes (Signed)
D/C instructions reviewed with patient and patient able to provide verbal understanding.  Pt d/c home with spouse and family. Brooke [name] A

## 2014-02-28 ENCOUNTER — Ambulatory Visit: Payer: Medicare Other | Attending: Internal Medicine

## 2014-02-28 DIAGNOSIS — I8010 Phlebitis and thrombophlebitis of unspecified femoral vein: Secondary | ICD-10-CM

## 2014-02-28 DIAGNOSIS — G894 Chronic pain syndrome: Secondary | ICD-10-CM | POA: Diagnosis not present

## 2014-02-28 DIAGNOSIS — M5380 Other specified dorsopathies, site unspecified: Secondary | ICD-10-CM | POA: Diagnosis not present

## 2014-02-28 DIAGNOSIS — Z79899 Other long term (current) drug therapy: Secondary | ICD-10-CM | POA: Diagnosis not present

## 2014-02-28 DIAGNOSIS — M5137 Other intervertebral disc degeneration, lumbosacral region: Secondary | ICD-10-CM | POA: Diagnosis not present

## 2014-02-28 LAB — CBC WITH DIFFERENTIAL/PLATELET
Basophils Absolute: 0.1 10*3/uL (ref 0.0–0.1)
Basophils Relative: 1 % (ref 0–1)
Eosinophils Absolute: 0.1 10*3/uL (ref 0.0–0.7)
Eosinophils Relative: 1 % (ref 0–5)
HEMATOCRIT: 39.9 % (ref 36.0–46.0)
Hemoglobin: 13.3 g/dL (ref 12.0–15.0)
LYMPHS ABS: 2.5 10*3/uL (ref 0.7–4.0)
LYMPHS PCT: 21 % (ref 12–46)
MCH: 32.9 pg (ref 26.0–34.0)
MCHC: 33.3 g/dL (ref 30.0–36.0)
MCV: 98.8 fL (ref 78.0–100.0)
MONO ABS: 1.2 10*3/uL — AB (ref 0.1–1.0)
Monocytes Relative: 10 % (ref 3–12)
Neutro Abs: 7.8 10*3/uL — ABNORMAL HIGH (ref 1.7–7.7)
Neutrophils Relative %: 67 % (ref 43–77)
Platelets: 197 10*3/uL (ref 150–400)
RBC: 4.04 MIL/uL (ref 3.87–5.11)
RDW: 15.8 % — AB (ref 11.5–15.5)
WBC: 11.7 10*3/uL — AB (ref 4.0–10.5)

## 2014-02-28 LAB — POCT INR: INR: 2.6

## 2014-02-28 MED ORDER — AMOXICILLIN-POT CLAVULANATE 875-125 MG PO TABS: 1.0000 | ORAL_TABLET | Freq: Two times a day (BID) | ORAL | Status: DC

## 2014-02-28 NOTE — Patient Instructions (Signed)
Continue taking Coumadin 5 mg tablet daily Take medication everyday Return in 1 week Take prescribed antibiotics for tooth ache twice a day for 10 days

## 2014-02-28 NOTE — Progress Notes (Unsigned)
Pt comes in for repeat INR s/p sub-therapeutic during hospital admission Pt states she is compliant with taking Coumadin C/o right facial pain due to toothache.denies drainage  Pt appears very lethargic,unable to stay awake

## 2014-03-24 ENCOUNTER — Ambulatory Visit (INDEPENDENT_AMBULATORY_CARE_PROVIDER_SITE_OTHER): Payer: Medicare Other | Admitting: Pulmonary Disease

## 2014-03-24 ENCOUNTER — Encounter: Payer: Self-pay | Admitting: Pulmonary Disease

## 2014-03-24 VITALS — BP 128/72 | HR 100 | Ht <= 58 in | Wt 270.0 lb

## 2014-03-24 DIAGNOSIS — I2699 Other pulmonary embolism without acute cor pulmonale: Secondary | ICD-10-CM

## 2014-03-24 DIAGNOSIS — G4733 Obstructive sleep apnea (adult) (pediatric): Secondary | ICD-10-CM

## 2014-03-24 DIAGNOSIS — J4520 Mild intermittent asthma, uncomplicated: Secondary | ICD-10-CM

## 2014-03-24 DIAGNOSIS — J45909 Unspecified asthma, uncomplicated: Secondary | ICD-10-CM

## 2014-03-24 LAB — PULMONARY FUNCTION TEST
DL/VA % pred: 147 %
DL/VA: 5.74 ml/min/mmHg/L
DLCO UNC % PRED: 105 %
DLCO unc: 17.14 ml/min/mmHg
FEF 25-75 Post: 2.31 L/sec
FEF 25-75 Pre: 2.12 L/sec
FEF2575-%Change-Post: 9 %
FEF2575-%PRED-POST: 128 %
FEF2575-%Pred-Pre: 117 %
FEV1-%Change-Post: 2 %
FEV1-%PRED-POST: 94 %
FEV1-%Pred-Pre: 92 %
FEV1-Post: 1.57 L
FEV1-Pre: 1.53 L
FEV1FVC-%Change-Post: 0 %
FEV1FVC-%Pred-Pre: 107 %
FEV6-%Change-Post: 2 %
FEV6-%PRED-PRE: 87 %
FEV6-%Pred-Post: 89 %
FEV6-POST: 1.81 L
FEV6-Pre: 1.77 L
FEV6FVC-%PRED-POST: 104 %
FEV6FVC-%PRED-PRE: 104 %
FVC-%Change-Post: 2 %
FVC-%Pred-Post: 86 %
FVC-%Pred-Pre: 84 %
FVC-PRE: 1.77 L
FVC-Post: 1.81 L
PRE FEV1/FVC RATIO: 86 %
PRE FEV6/FVC RATIO: 100 %
Post FEV1/FVC ratio: 87 %
Post FEV6/FVC ratio: 100 %
RV % pred: 81 %
RV: 1.32 L
TLC % PRED: 81 %
TLC: 3.37 L

## 2014-03-24 NOTE — Progress Notes (Signed)
PFT done today. 

## 2014-03-24 NOTE — Assessment & Plan Note (Signed)
She needs to follow up with her sleep study, we have rescheduled it for next month

## 2014-03-24 NOTE — Progress Notes (Signed)
Subjective:    Patient ID: Brooke [name], female    DOB: February 14, 1956, 58 y.o.   MRN: [id number]  HPI  Chief Complaint  Patient presents with  . Follow-up    Review PFT.  States she could not make sleep study appt d/t DVT in R leg and being hospitalized for that.     03/24/2014 ROV > Ms Kole said that just a few days suddenly one night she wok up with the sudden onset of left leg pain and swelling and she was found to have a pulmonary embolism. She was re-admitted the hospital for worsening dyspnea and she is currently taking wafarin for this.  She had not been hospitalized prior to this.  She is almost out of her warfarin.  She currently has some low back pain.  She is going to the [facility] right now for her medications. She has been taking coumadin but she has not been back for blood draws since 9/23.  She currently has no plan for the coumadin.    Past Medical History  Diagnosis Date  . Hypertension   . Asthma   . COPD (chronic obstructive pulmonary disease)   . Anxiety   . Cancer   . Colon cancer   . Dyslipidemia   . Migraine headache   . Chronic bronchitis   . Uterine fibroid   . Ovarian cyst   . GERD (gastroesophageal reflux disease)   . Morbid obesity   . Obstructive sleep apnea     mild  . Chronic pain   . Chronic back pain   . Arthritis   . Osteoarthritis   . Depression   . DVT (deep vein thrombosis) in pregnancy      Review of Systems  Constitutional: Positive for fatigue. Negative for fever and chills.  HENT: Negative for postnasal drip, rhinorrhea and sinus pressure.   Respiratory: Positive for shortness of breath. Negative for cough and wheezing.   Cardiovascular: Negative for chest pain, palpitations and leg swelling.       Objective:   Physical Exam Filed Vitals:   03/24/14 1105  BP: 128/72  Pulse: 100  Height: 4\' 10"  (1.473 m)  Weight: 270 lb (122.471 kg)  SpO2: 100%    Gen: Anxious HEENT: NCAT, PERRL, EOMi PULM: CTA B CV: RRR, no  mgr AB: BS+, soft, nontender Ext: minimal edema right leg, she says improved, warm and well perfused Neuro: A&Ox4, maew      Assessment & Plan:   Asthma, chronic If she has asthma, it is mild intermittent at best.  Her PFT was normal.  She does not have COPD nor does she have interstitial lung disease.  Plan: -use albuterol prn -flu shot annually  Obstructive sleep apnea She needs to follow up with her sleep study, we have rescheduled it for next month  Acute pulmonary embolism She had an unprovoked pulmonary embolism and DVT not long after I saw her.  While this is contributing to her dyspnea, she only had small subsegmental PE's and I think the majority of her dyspnea is from obesity and deconditioning.  She had normal RV size and function on her echo.  Plan: -we have arranged for her to go back to coumadin clinic -she needs lifelong anticoagulation at this point -she needs to check with her PCP to make sure that her age appropriate screening is up to date.    Updated Medication List Outpatient Encounter Prescriptions as of 03/24/2014  Medication Sig  . albuterol (PROVENTIL HFA;VENTOLIN  HFA) 108 (90 BASE) MCG/ACT inhaler Inhale 2 puffs into the lungs every 6 (six) hours as needed for wheezing or shortness of breath (shortness of breath).  . fluticasone (FLOVENT HFA) 110 MCG/ACT inhaler Inhale 2 puffs into the lungs every 12 (twelve) hours.  . furosemide (LASIX) 20 MG tablet Take 1 tablet (20 mg total) by mouth daily.  Marland Kitchen LORazepam (ATIVAN) 1 MG tablet Take 1 mg by mouth every 6 (six) hours as needed for anxiety (anxiety).  Marland Kitchen omeprazole (PRILOSEC) 40 MG capsule Take 1 capsule (40 mg total) by mouth daily.  Marland Kitchen oxyCODONE-acetaminophen (PERCOCET/ROXICET) 5-325 MG per tablet Take 1-2 tablets by mouth every 4 (four) hours as needed for moderate pain or severe pain (leg & back pain).  . potassium chloride SA (K-DUR,KLOR-CON) 20 MEQ tablet Take 0.5 tablets (10 mEq total) by mouth  daily.  . sertraline (ZOLOFT) 100 MG tablet Take 1 tablet (100 mg total) by mouth daily.  . traZODone (DESYREL) 50 MG tablet Take 1 tablet (50 mg total) by mouth at bedtime.  Marland Kitchen warfarin (COUMADIN) 5 MG tablet Take 1 tablet (5 mg total) by mouth daily.  . [DISCONTINUED] amoxicillin-clavulanate (AUGMENTIN) 875-125 MG per tablet Take 1 tablet by mouth 2 (two) times daily.

## 2014-03-24 NOTE — Assessment & Plan Note (Signed)
She had an unprovoked pulmonary embolism and DVT not long after I saw her.  While this is contributing to her dyspnea, she only had small subsegmental PE's and I think the majority of her dyspnea is from obesity and deconditioning.  She had normal RV size and function on her echo.  Plan: -we have arranged for her to go back to coumadin clinic -she needs lifelong anticoagulation at this point -she needs to check with her PCP to make sure that her age appropriate screening is up to date.

## 2014-03-24 NOTE — Patient Instructions (Signed)
We will make arrangements for the coumadin clinic today for treatment of your blood clot Exercise regularly and lose weight We will reschedule the sleep study for next month Use the albuterol as needed We will see you back in 6 weeks or sooner if needed

## 2014-03-24 NOTE — Assessment & Plan Note (Signed)
If she has asthma, it is mild intermittent at best.  Her PFT was normal.  She does not have COPD nor does she have interstitial lung disease.  Plan: -use albuterol prn -flu shot annually

## 2014-03-28 ENCOUNTER — Emergency Department (HOSPITAL_COMMUNITY)
Admission: EM | Admit: 2014-03-28 | Discharge: 2014-03-28 | Disposition: A | Discharge status: Home / Self Care | Payer: Medicare Other | Attending: Emergency Medicine | Admitting: Emergency Medicine

## 2014-03-28 ENCOUNTER — Emergency Department (HOSPITAL_COMMUNITY): Payer: Medicare Other

## 2014-03-28 ENCOUNTER — Encounter (HOSPITAL_COMMUNITY): Payer: Self-pay | Admitting: Emergency Medicine

## 2014-03-28 DIAGNOSIS — J441 Chronic obstructive pulmonary disease with (acute) exacerbation: Secondary | ICD-10-CM | POA: Diagnosis not present

## 2014-03-28 DIAGNOSIS — Z85038 Personal history of other malignant neoplasm of large intestine: Secondary | ICD-10-CM | POA: Insufficient documentation

## 2014-03-28 DIAGNOSIS — Z86718 Personal history of other venous thrombosis and embolism: Secondary | ICD-10-CM | POA: Diagnosis not present

## 2014-03-28 DIAGNOSIS — M7989 Other specified soft tissue disorders: Secondary | ICD-10-CM | POA: Diagnosis not present

## 2014-03-28 DIAGNOSIS — Z8742 Personal history of other diseases of the female genital tract: Secondary | ICD-10-CM | POA: Diagnosis not present

## 2014-03-28 DIAGNOSIS — Z9889 Other specified postprocedural states: Secondary | ICD-10-CM | POA: Insufficient documentation

## 2014-03-28 DIAGNOSIS — K219 Gastro-esophageal reflux disease without esophagitis: Secondary | ICD-10-CM | POA: Insufficient documentation

## 2014-03-28 DIAGNOSIS — R2243 Localized swelling, mass and lump, lower limb, bilateral: Secondary | ICD-10-CM | POA: Insufficient documentation

## 2014-03-28 DIAGNOSIS — R0789 Other chest pain: Secondary | ICD-10-CM | POA: Diagnosis not present

## 2014-03-28 DIAGNOSIS — G8929 Other chronic pain: Secondary | ICD-10-CM | POA: Insufficient documentation

## 2014-03-28 DIAGNOSIS — F419 Anxiety disorder, unspecified: Secondary | ICD-10-CM | POA: Diagnosis not present

## 2014-03-28 DIAGNOSIS — R0602 Shortness of breath: Secondary | ICD-10-CM | POA: Diagnosis not present

## 2014-03-28 DIAGNOSIS — Z79899 Other long term (current) drug therapy: Secondary | ICD-10-CM | POA: Diagnosis not present

## 2014-03-28 DIAGNOSIS — I10 Essential (primary) hypertension: Secondary | ICD-10-CM | POA: Diagnosis not present

## 2014-03-28 DIAGNOSIS — R05 Cough: Secondary | ICD-10-CM | POA: Diagnosis not present

## 2014-03-28 DIAGNOSIS — M1990 Unspecified osteoarthritis, unspecified site: Secondary | ICD-10-CM | POA: Diagnosis not present

## 2014-03-28 DIAGNOSIS — F329 Major depressive disorder, single episode, unspecified: Secondary | ICD-10-CM | POA: Diagnosis not present

## 2014-03-28 DIAGNOSIS — M79605 Pain in left leg: Secondary | ICD-10-CM | POA: Diagnosis present

## 2014-03-28 DIAGNOSIS — E876 Hypokalemia: Secondary | ICD-10-CM | POA: Insufficient documentation

## 2014-03-28 LAB — BASIC METABOLIC PANEL
Anion gap: 13 (ref 5–15)
BUN: 6 mg/dL (ref 6–23)
CALCIUM: 8.8 mg/dL (ref 8.4–10.5)
CO2: 31 meq/L (ref 19–32)
CREATININE: 0.72 mg/dL (ref 0.50–1.10)
Chloride: 100 mEq/L (ref 96–112)
GFR calc Af Amer: >90 mL/min (ref >90)
GLUCOSE: 108 mg/dL — AB (ref 70–99)
Potassium: 3 mEq/L — ABNORMAL LOW (ref 3.7–5.3)
SODIUM: 144 meq/L (ref 137–147)

## 2014-03-28 LAB — CBC WITH DIFFERENTIAL/PLATELET
Basophils Absolute: 0 10*3/uL (ref 0.0–0.1)
Basophils Relative: 0 % (ref 0–1)
EOS ABS: 0.1 10*3/uL (ref 0.0–0.7)
EOS PCT: 1 % (ref 0–5)
HEMATOCRIT: 42.9 % (ref 36.0–46.0)
Hemoglobin: 14.3 g/dL (ref 12.0–15.0)
Lymphocytes Relative: 30 % (ref 12–46)
Lymphs Abs: 1.8 10*3/uL (ref 0.7–4.0)
MCH: 33.3 pg (ref 26.0–34.0)
MCHC: 33.3 g/dL (ref 30.0–36.0)
MCV: 100 fL (ref 78.0–100.0)
MONO ABS: 0.5 10*3/uL (ref 0.1–1.0)
Monocytes Relative: 9 % (ref 3–12)
Neutro Abs: 3.6 10*3/uL (ref 1.7–7.7)
Neutrophils Relative %: 60 % (ref 43–77)
PLATELETS: 148 10*3/uL — AB (ref 150–400)
RBC: 4.29 MIL/uL (ref 3.87–5.11)
RDW: 14.2 % (ref 11.5–15.5)
WBC: 6 10*3/uL (ref 4.0–10.5)

## 2014-03-28 LAB — TROPONIN I: Troponin I: <0.3 ng/mL (ref <0.30)

## 2014-03-28 LAB — PROTIME-INR
INR: 2.1 — AB (ref 0.00–1.49)
Prothrombin Time: 23.7 seconds — ABNORMAL HIGH (ref 11.6–15.2)

## 2014-03-28 LAB — PRO B NATRIURETIC PEPTIDE: PRO B NATRI PEPTIDE: 498.9 pg/mL — AB (ref 0–125)

## 2014-03-28 MED ORDER — MORPHINE SULFATE 4 MG/ML IJ SOLN
4.0000 mg | Freq: Once | INTRAMUSCULAR | Status: AC
  Administered 2014-03-28: 4 mg via INTRAVENOUS
  Filled 2014-03-28: qty 1

## 2014-03-28 MED ORDER — FUROSEMIDE 20 MG PO TABS: 20.0000 mg | ORAL_TABLET | Freq: Two times a day (BID) | ORAL | Status: DC

## 2014-03-28 MED ORDER — ONDANSETRON HCL 4 MG/2ML IJ SOLN
4.0000 mg | Freq: Once | INTRAMUSCULAR | Status: AC
  Administered 2014-03-28: 4 mg via INTRAVENOUS
  Filled 2014-03-28: qty 2

## 2014-03-28 MED ORDER — OXYCODONE-ACETAMINOPHEN 5-325 MG PO TABS
2.0000 | ORAL_TABLET | Freq: Once | ORAL | Status: AC
  Administered 2014-03-28: 2 via ORAL
  Filled 2014-03-28: qty 2

## 2014-03-28 MED ORDER — POTASSIUM CHLORIDE 20 MEQ/15ML (10%) PO LIQD
40.0000 meq | Freq: Once | ORAL | Status: AC
  Administered 2014-03-28: 40 meq via ORAL
  Filled 2014-03-28: qty 30

## 2014-03-28 MED ORDER — OXYCODONE-ACETAMINOPHEN 5-325 MG PO TABS: 1.0000 | ORAL_TABLET | Freq: Four times a day (QID) | ORAL | Status: DC | PRN

## 2014-03-28 NOTE — ED Notes (Addendum)
Patient with c/o left arm pain Patient also states that "the pain is jumping from one arm to the other and then in my legs" Patient with hx of blood clots Patient poor historian and unable to focus on questions when asked by this nurse--talking on her cell phone Lidocaine patch noted on left arm

## 2014-03-28 NOTE — ED Notes (Signed)
Patient given coffee after OK by Dr. Dina Rich

## 2014-03-28 NOTE — ED Provider Notes (Signed)
CSN: [id number]     Arrival date & time 03/28/14  0900 History   First MD Initiated Contact with Patient 03/28/14 [phone]     Chief Complaint  Patient presents with  . Arm Pain  . Leg Pain     (Consider location/radiation/quality/duration/timing/severity/associated sxs/prior Treatment) HPI  This is a 78 yofemale with history of hypertension, COPD, recent PE on Coumadin who presents with multiple complaints. Patient reports left arm pain and a knot. She denies any injury. Patient reports swelling in the bilateral lower extremities and pain that migrates from her left her right leg. Was recently admitted and discharged on Coumadin for a PE. Noted at that time to have a right lower extremity DVT. She reports shortness of breath without chest pain. She states the shortness of breath has gotten worse and is more on exertion. She is unsure of whether she is therapeutic on her Coumadin. Denies any recent coughs or fevers.  Is very concerned about increased pain in BLE.  Is out of her oxycodone.   Past Medical History  Diagnosis Date  . Hypertension   . Asthma   . COPD (chronic obstructive pulmonary disease)   . Anxiety   . Cancer   . Colon cancer   . Dyslipidemia   . Migraine headache   . Chronic bronchitis   . Uterine fibroid   . Ovarian cyst   . GERD (gastroesophageal reflux disease)   . Morbid obesity   . Obstructive sleep apnea     mild  . Chronic pain   . Chronic back pain   . Arthritis   . Osteoarthritis   . Depression   . DVT (deep vein thrombosis) in pregnancy    Past Surgical History  Procedure Laterality Date  . Knee arthroscopy      bil.  . Carpal tunnel release      rt  . Mouth surgery      teeth extraction  . Cesarean section    . Subtotal colectomy    . Colonoscopy    . Tubal ligation     Family History  Problem Relation Age of Onset  . Uterine cancer Mother   . Stroke Father   . Colon cancer Maternal Uncle   . Diabetes Father   . Ovarian cancer Mother    . Hypertension Father   . Breast cancer Maternal Grandmother   . Diabetes Sister   . Asthma Child   . Asthma Child    History  Substance Use Topics  . Smoking status: Current Some Day Smoker -- 1.00 packs/day for 37 years    Types: Cigarettes  . Smokeless tobacco: Never Used     Comment: trying to quit, down to .5ppd X2 years ago.   . Alcohol Use: No   OB History   Grav Para Term Preterm Abortions TAB SAB Ect Mult Living   2 2 2       2      Review of Systems  Constitutional: Negative for fever.  Respiratory: Positive for chest tightness and shortness of breath. Negative for cough.   Cardiovascular: Positive for leg swelling. Negative for chest pain.  Gastrointestinal: Negative for nausea, vomiting and abdominal pain.  Genitourinary: Negative for dysuria.  Musculoskeletal: Negative for back pain.       Left arm pain  Neurological: Negative for headaches.  Psychiatric/Behavioral: Negative for confusion.  All other systems reviewed and are negative.     Allergies  Kiwi extract and Aspirin  Home Medications  Prior to Admission medications   Medication Sig Start Date End Date Taking? Authorizing Provider  albuterol (PROVENTIL HFA;VENTOLIN HFA) 108 (90 BASE) MCG/ACT inhaler Inhale 2 puffs into the lungs every 6 (six) hours as needed for wheezing or shortness of breath (shortness of breath). 02/20/14  Yes Oswald Hillock, MD  fluticasone (FLOVENT HFA) 110 MCG/ACT inhaler Inhale 2 puffs into the lungs every 12 (twelve) hours. 01/17/14  Yes Lorayne Marek, MD  furosemide (LASIX) 20 MG tablet Take 1 tablet (20 mg total) by mouth daily. 02/20/14  Yes Oswald Hillock, MD  LORazepam (ATIVAN) 1 MG tablet Take 1 mg by mouth every 6 (six) hours as needed for anxiety (anxiety). 01/17/14  Yes Lorayne Marek, MD  omeprazole (PRILOSEC) 40 MG capsule Take 1 capsule (40 mg total) by mouth daily. 01/17/14  Yes Lorayne Marek, MD  oxyCODONE-acetaminophen (PERCOCET/ROXICET) 5-325 MG per tablet Take 1-2  tablets by mouth every 4 (four) hours as needed for moderate pain or severe pain (leg & back pain). 02/20/14  Yes Oswald Hillock, MD  potassium chloride SA (K-DUR,KLOR-CON) 20 MEQ tablet Take 0.5 tablets (10 mEq total) by mouth daily. 02/20/14  Yes Oswald Hillock, MD  sertraline (ZOLOFT) 100 MG tablet Take 1 tablet (100 mg total) by mouth daily. 01/17/14  Yes Lorayne Marek, MD  traZODone (DESYREL) 50 MG tablet Take 1 tablet (50 mg total) by mouth at bedtime. 01/17/14  Yes Lorayne Marek, MD  warfarin (COUMADIN) 5 MG tablet Take 1 tablet (5 mg total) by mouth daily. 02/20/14  Yes Oswald Hillock, MD  furosemide (LASIX) 20 MG tablet Take 1 tablet (20 mg total) by mouth 2 (two) times daily. 03/28/14   Merryl Hacker, MD  oxyCODONE-acetaminophen (PERCOCET/ROXICET) 5-325 MG per tablet Take 1-2 tablets by mouth every 6 (six) hours as needed for severe pain. 03/28/14   Merryl Hacker, MD   BP 129/71  Pulse 89  Temp(Src) 98.6 F (37 C) (Oral)  Resp 20  SpO2 100%  LMP 05/24/2003 Physical Exam  Nursing note and vitals reviewed. Constitutional: She is oriented to person, place, and time. No distress.  Obese  HENT:  Head: Normocephalic and atraumatic.  Mouth/Throat: Oropharynx is clear and moist.  Eyes: Pupils are equal, round, and reactive to light.  Cardiovascular: Normal rate, regular rhythm and normal heart sounds.   No murmur heard. Pulmonary/Chest: Effort normal. No respiratory distress. She has no wheezes.  Exam limited by body habitus  Abdominal: Soft. Bowel sounds are normal. There is no tenderness. There is no rebound.  Musculoskeletal:  1+ right lower extremity edema, asymmetric FOcused examination of left upper extremity reveals no obvious injury, deformity, or swelling, full range of motion at the shoulder and elbow, 2+ radial pulse  Neurological: She is alert and oriented to person, place, and time.  Skin: Skin is warm and dry.  Psychiatric: She has a normal mood and affect.    ED  Course  Procedures (including critical care time) Labs Review Labs Reviewed  CBC WITH DIFFERENTIAL - Abnormal; Notable for the following:    Platelets 148 (*)    All other components within normal limits  BASIC METABOLIC PANEL - Abnormal; Notable for the following:    Potassium 3.0 (*)    Glucose, Bld 108 (*)    All other components within normal limits  PRO B NATRIURETIC PEPTIDE - Abnormal; Notable for the following:    Pro B Natriuretic peptide (BNP) 498.9 (*)    All other components within  normal limits  PROTIME-INR - Abnormal; Notable for the following:    Prothrombin Time 23.7 (*)    INR 2.10 (*)    All other components within normal limits  TROPONIN I    Imaging Review Dg Chest 2 View  03/28/2014   CLINICAL DATA:  One pack-per-day tobacco use, cough and mid chest pain, known history of pulmonary embolism  EXAM: CHEST  2 VIEW  COMPARISON:  02/23/2014  FINDINGS: Cardiac shadow is stable but mildly enlarged. The lungs are well aerated bilaterally without focal infiltrate or sizable effusion. Mild degenerative change of the thoracic spine is noted.  IMPRESSION: No acute abnormality is noted.   Electronically Signed   By: Inez Catalina M.D.   On: 03/28/2014 09:52     EKG Interpretation   Date/Time:  Tuesday March 28 2014 09:56:45 EDT Ventricular Rate:  106 PR Interval:  133 QRS Duration: 82 QT Interval:  368 QTC Calculation: 489 R Axis:   49 Text Interpretation:  Sinus tachycardia Ventricular bigeminy Consider  right atrial enlargement Low voltage, precordial leads Right atrial  enlargement new when compared to prior Confirmed by Haily Caley  MD, [place]  ([id number]) on 03/28/2014 10:14:57 AM      MDM   Final diagnoses:  History of blood clots  Swelling of lower extremity  Shortness of breath  Hypokalemia   Patient with recent history of PE presents with multiple complaints including pain, BLE (R>L) lower extremity swelling, and SOB.  She is nontoxic. VSS  No hypoxia.   EKG with right atrial enlargement (corresponds with prior echo). Mild hypokalemia on BMP.  Troponin is neg and INR is therapeutic.  Mild elevation in BNP.  CHest xray clear.  Patient recently seen by pulmonology with complaints progressive SOB.  THought to be multifactorial including deconditioning and body habitus.  Patient does not feel fluid pill is working as well.  GIven swelling, will increase lasix to BID x 3 days.  GIven therapeutic INR and small clot burden on prior CTA, low suspicion for PE as the cause.  Patient also is out of pain medications.  Will d/c with short course of pain medication and increased lasix dose as above.  Patient to follow-up with PCP and pulmonology.  After history, exam, and medical workup I feel the patient has been appropriately medically screened and is safe for discharge home. Pertinent diagnoses were discussed with the patient. Patient was given return precautions.     Merryl Hacker, MD 03/28/14 [phone]

## 2014-03-28 NOTE — ED Notes (Signed)
Patient also "doesn't know if my Coumadin is working." Patient states that she has not followed up for regular PT/INR checks as instructed Patient also states that she was supposed to have a sleep study, but didn't go

## 2014-03-28 NOTE — Discharge Instructions (Signed)
You were seen today for extremity pain, swelling. You were found to have borderline low potassium. Your Coumadin is therapeutic. Your workup is largely reassuring. Will increase your Lasix for the next 3 days to twice daily. You need to have her potassium rechecked next week. You should followup with her pulmonologist regarding ongoing shortness of breath and Coumadin clinic for INR check.  Hypokalemia Hypokalemia means that the amount of potassium in the blood is lower than normal.Potassium is a chemical, called an electrolyte, that helps regulate the amount of fluid in the body. It also stimulates muscle contraction and helps nerves function properly.Most of the body's potassium is inside of cells, and only a very small amount is in the blood. Because the amount in the blood is so small, minor changes can be life-threatening. CAUSES  Antibiotics.  Diarrhea or vomiting.  Using laxatives too much, which can cause diarrhea.  Chronic kidney disease.  Water pills (diuretics).  Eating disorders (bulimia).  Low magnesium level.  Sweating a lot. SIGNS AND SYMPTOMS  Weakness.  Constipation.  Fatigue.  Muscle cramps.  Mental confusion.  Skipped heartbeats or irregular heartbeat (palpitations).  Tingling or numbness. DIAGNOSIS  Your health care provider can diagnose hypokalemia with blood tests. In addition to checking your potassium level, your health care provider may also check other lab tests. TREATMENT Hypokalemia can be treated with potassium supplements taken by mouth or adjustments in your current medicines. If your potassium level is very low, you may need to get potassium through a vein (IV) and be monitored in the hospital. A diet high in potassium is also helpful. Foods high in potassium are:  Nuts, such as peanuts and pistachios.  Seeds, such as sunflower seeds and pumpkin seeds.  Peas, lentils, and lima beans.  Whole grain and bran cereals and breads.  Fresh  fruit and vegetables, such as apricots, avocado, bananas, cantaloupe, kiwi, oranges, tomatoes, asparagus, and potatoes.  Orange and tomato juices.  Red meats.  Fruit yogurt. HOME CARE INSTRUCTIONS  Take all medicines as prescribed by your health care provider.  Maintain a healthy diet by including nutritious food, such as fruits, vegetables, nuts, whole grains, and lean meats.  If you are taking a laxative, be sure to follow the directions on the label. SEEK MEDICAL CARE IF:  Your weakness gets worse.  You feel your heart pounding or racing.  You are vomiting or having diarrhea.  You are diabetic and having trouble keeping your blood glucose in the normal range. SEEK IMMEDIATE MEDICAL CARE IF:  You have chest pain, shortness of breath, or dizziness.  You are vomiting or having diarrhea for more than 2 days.  You faint. MAKE SURE YOU:   Understand these instructions.  Will watch your condition.  Will get help right away if you are not doing well or get worse. Document Released: 05/26/2005 Document Revised: 03/16/2013 Document Reviewed: 11/26/2012 [facility] Patient Information 2015 [place], [place]. This information is not intended to replace advice given to you by your health care provider. Make sure you discuss any questions you have with your health care provider. Edema Edema is an abnormal buildup of fluids in your bodytissues. Edema is somewhatdependent on gravity to pull the fluid to the lowest place in your body. That makes the condition more common in the legs and thighs (lower extremities). Painless swelling of the feet and ankles is common and becomes more likely as you get older. It is also common in looser tissues, like around your eyes.  When  the affected area is squeezed, the fluid may move out of that spot and leave a dent for a few moments. This dent is called pitting.  CAUSES  There are many possible causes of edema. Eating too much salt and being on your  feet or sitting for a long time can cause edema in your legs and ankles. Hot weather may make edema worse. Common medical causes of edema include:  Heart failure.  Liver disease.  Kidney disease.  Weak blood vessels in your legs.  Cancer.  An injury.  Pregnancy.  Some medications.  Obesity. SYMPTOMS  Edema is usually painless.Your skin may look swollen or shiny.  DIAGNOSIS  Your health care provider may be able to diagnose edema by asking about your medical history and doing a physical exam. You may need to have tests such as X-rays, an electrocardiogram, or blood tests to check for medical conditions that may cause edema.  TREATMENT  Edema treatment depends on the cause. If you have heart, liver, or kidney disease, you need the treatment appropriate for these conditions. General treatment may include:  Elevation of the affected body part above the level of your heart.  Compression of the affected body part. Pressure from elastic bandages or support stockings squeezes the tissues and forces fluid back into the blood vessels. This keeps fluid from entering the tissues.  Restriction of fluid and salt intake.  Use of a water pill (diuretic). These medications are appropriate only for some types of edema. They pull fluid out of your body and make you urinate more often. This gets rid of fluid and reduces swelling, but diuretics can have side effects. Only use diuretics as directed by your health care provider. HOME CARE INSTRUCTIONS   Keep the affected body part above the level of your heart when you are lying down.   Do not sit still or stand for prolonged periods.   Do not put anything directly under your knees when lying down.  Do not wear constricting clothing or garters on your upper legs.   Exercise your legs to work the fluid back into your blood vessels. This may help the swelling go down.   Wear elastic bandages or support stockings to reduce ankle swelling as  directed by your health care provider.   Eat a low-salt diet to reduce fluid if your health care provider recommends it.   Only take medicines as directed by your health care provider. SEEK MEDICAL CARE IF:   Your edema is not responding to treatment.  You have heart, liver, or kidney disease and notice symptoms of edema.  You have edema in your legs that does not improve after elevating them.   You have sudden and unexplained weight gain. SEEK IMMEDIATE MEDICAL CARE IF:   You develop shortness of breath or chest pain.   You cannot breathe when you lie down.  You develop pain, redness, or warmth in the swollen areas.   You have heart, liver, or kidney disease and suddenly get edema.  You have a fever and your symptoms suddenly get worse. MAKE SURE YOU:   Understand these instructions.  Will watch your condition.  Will get help right away if you are not doing well or get worse. Document Released: 05/26/2005 Document Revised: 10/10/2013 Document Reviewed: 03/18/2013 [facility] Patient Information 2015 [place], [place]. This information is not intended to replace advice given to you by your health care provider. Make sure you discuss any questions you have with your health care provider.

## 2014-03-28 NOTE — ED Notes (Signed)
Patient repeatedly pushing the call bell Patient asking to speak with Dr. Roxy Horseman previously documented, EDP made aware of patient's request and MD will speak with patient when available to do so. Patient has been informed of this several times. Patient asking for a warm blanket--additional blankets given to patient by this nurse and ED Pharm tech, for a total of four blankets. Patient asking for food/drink--patient informed of NPO status until testing is completed several times during which she verbalized understanding Patient asking for [facility] to lowered/elevated, which was done by this nurse several times Patient continues to have multiple complaints, all of which have been addressed by either this nurse or the [place] Per ED Pharm tech, patient stated that she "wished my nurse would just come in here and talk to me."--this nurse has been at the bedside multiple time to address patient complaints, update patient as to plan of care, elevate/lower [facility], give blankets and remind patient that she is not yet allowed to eat or drink Will make ED Charge nurse aware of patient's multiple complaints

## 2014-03-28 NOTE — ED Notes (Signed)
Patient asking for additional pain medication Patient then stated "I'm short of breath, like I can't breathe." LCTA bilaterally O2 sat 100% on room air Patient appears in NAD Will make EDP aware

## 2014-03-29 ENCOUNTER — Ambulatory Visit (HOSPITAL_BASED_OUTPATIENT_CLINIC_OR_DEPARTMENT_OTHER): Payer: Medicare Other | Attending: Pulmonary Disease

## 2014-03-31 DIAGNOSIS — M542 Cervicalgia: Secondary | ICD-10-CM | POA: Diagnosis not present

## 2014-03-31 DIAGNOSIS — M791 Myalgia: Secondary | ICD-10-CM | POA: Diagnosis not present

## 2014-03-31 DIAGNOSIS — M47817 Spondylosis without myelopathy or radiculopathy, lumbosacral region: Secondary | ICD-10-CM | POA: Diagnosis not present

## 2014-03-31 DIAGNOSIS — M5137 Other intervertebral disc degeneration, lumbosacral region: Secondary | ICD-10-CM | POA: Diagnosis not present

## 2014-03-31 DIAGNOSIS — Z79899 Other long term (current) drug therapy: Secondary | ICD-10-CM | POA: Diagnosis not present

## 2014-03-31 DIAGNOSIS — M2550 Pain in unspecified joint: Secondary | ICD-10-CM | POA: Diagnosis not present

## 2014-03-31 DIAGNOSIS — M5417 Radiculopathy, lumbosacral region: Secondary | ICD-10-CM | POA: Diagnosis not present

## 2014-03-31 DIAGNOSIS — Q762 Congenital spondylolisthesis: Secondary | ICD-10-CM | POA: Diagnosis not present

## 2014-03-31 DIAGNOSIS — G894 Chronic pain syndrome: Secondary | ICD-10-CM | POA: Diagnosis not present

## 2014-04-02 ENCOUNTER — Encounter (HOSPITAL_BASED_OUTPATIENT_CLINIC_OR_DEPARTMENT_OTHER): Payer: Medicare Other

## 2014-04-05 ENCOUNTER — Ambulatory Visit (HOSPITAL_BASED_OUTPATIENT_CLINIC_OR_DEPARTMENT_OTHER): Payer: Medicare Other | Attending: Pulmonary Disease

## 2014-04-10 ENCOUNTER — Encounter (HOSPITAL_COMMUNITY): Payer: Self-pay | Admitting: Emergency Medicine

## 2014-04-20 ENCOUNTER — Other Ambulatory Visit: Payer: Self-pay | Admitting: Internal Medicine

## 2014-04-28 ENCOUNTER — Ambulatory Visit: Payer: Medicare Other | Admitting: Internal Medicine

## 2014-04-28 DIAGNOSIS — G894 Chronic pain syndrome: Secondary | ICD-10-CM | POA: Diagnosis not present

## 2014-04-28 DIAGNOSIS — M5030 Other cervical disc degeneration, unspecified cervical region: Secondary | ICD-10-CM | POA: Diagnosis not present

## 2014-04-28 DIAGNOSIS — M791 Myalgia: Secondary | ICD-10-CM | POA: Diagnosis not present

## 2014-04-28 DIAGNOSIS — M47812 Spondylosis without myelopathy or radiculopathy, cervical region: Secondary | ICD-10-CM | POA: Diagnosis not present

## 2014-04-28 DIAGNOSIS — M5417 Radiculopathy, lumbosacral region: Secondary | ICD-10-CM | POA: Diagnosis not present

## 2014-04-28 DIAGNOSIS — Z79891 Long term (current) use of opiate analgesic: Secondary | ICD-10-CM | POA: Diagnosis not present

## 2014-04-28 DIAGNOSIS — M25569 Pain in unspecified knee: Secondary | ICD-10-CM | POA: Diagnosis not present

## 2014-04-28 DIAGNOSIS — M5137 Other intervertebral disc degeneration, lumbosacral region: Secondary | ICD-10-CM | POA: Diagnosis not present

## 2014-04-28 DIAGNOSIS — Z79899 Other long term (current) drug therapy: Secondary | ICD-10-CM | POA: Diagnosis not present

## 2014-04-28 DIAGNOSIS — M1990 Unspecified osteoarthritis, unspecified site: Secondary | ICD-10-CM | POA: Diagnosis not present

## 2014-05-03 DIAGNOSIS — R220 Localized swelling, mass and lump, head: Secondary | ICD-10-CM | POA: Diagnosis not present

## 2014-05-03 DIAGNOSIS — I10 Essential (primary) hypertension: Secondary | ICD-10-CM | POA: Diagnosis not present

## 2014-05-03 DIAGNOSIS — Z85038 Personal history of other malignant neoplasm of large intestine: Secondary | ICD-10-CM | POA: Diagnosis not present

## 2014-05-03 DIAGNOSIS — K219 Gastro-esophageal reflux disease without esophagitis: Secondary | ICD-10-CM | POA: Diagnosis not present

## 2014-05-03 DIAGNOSIS — R21 Rash and other nonspecific skin eruption: Secondary | ICD-10-CM | POA: Diagnosis not present

## 2014-05-03 DIAGNOSIS — S40862A Insect bite (nonvenomous) of left upper arm, initial encounter: Secondary | ICD-10-CM | POA: Diagnosis not present

## 2014-05-03 DIAGNOSIS — S0086XA Insect bite (nonvenomous) of other part of head, initial encounter: Secondary | ICD-10-CM | POA: Diagnosis not present

## 2014-05-08 ENCOUNTER — Ambulatory Visit: Payer: Medicare Other | Admitting: Pulmonary Disease

## 2014-05-16 DIAGNOSIS — G894 Chronic pain syndrome: Secondary | ICD-10-CM | POA: Diagnosis not present

## 2014-05-18 DIAGNOSIS — G894 Chronic pain syndrome: Secondary | ICD-10-CM | POA: Diagnosis not present

## 2014-05-22 DIAGNOSIS — G894 Chronic pain syndrome: Secondary | ICD-10-CM | POA: Diagnosis not present

## 2014-05-24 DIAGNOSIS — G894 Chronic pain syndrome: Secondary | ICD-10-CM | POA: Diagnosis not present

## 2014-05-26 DIAGNOSIS — Z79891 Long term (current) use of opiate analgesic: Secondary | ICD-10-CM | POA: Diagnosis not present

## 2014-05-26 DIAGNOSIS — M1990 Unspecified osteoarthritis, unspecified site: Secondary | ICD-10-CM | POA: Diagnosis not present

## 2014-05-26 DIAGNOSIS — G5792 Unspecified mononeuropathy of left lower limb: Secondary | ICD-10-CM | POA: Diagnosis not present

## 2014-05-26 DIAGNOSIS — M179 Osteoarthritis of knee, unspecified: Secondary | ICD-10-CM | POA: Diagnosis not present

## 2014-05-26 DIAGNOSIS — M542 Cervicalgia: Secondary | ICD-10-CM | POA: Diagnosis not present

## 2014-05-26 DIAGNOSIS — M791 Myalgia: Secondary | ICD-10-CM | POA: Diagnosis not present

## 2014-05-26 DIAGNOSIS — Z79899 Other long term (current) drug therapy: Secondary | ICD-10-CM | POA: Diagnosis not present

## 2014-05-26 DIAGNOSIS — M5030 Other cervical disc degeneration, unspecified cervical region: Secondary | ICD-10-CM | POA: Diagnosis not present

## 2014-05-26 DIAGNOSIS — M5412 Radiculopathy, cervical region: Secondary | ICD-10-CM | POA: Diagnosis not present

## 2014-05-26 DIAGNOSIS — G894 Chronic pain syndrome: Secondary | ICD-10-CM | POA: Diagnosis not present

## 2014-05-26 DIAGNOSIS — G5791 Unspecified mononeuropathy of right lower limb: Secondary | ICD-10-CM | POA: Diagnosis not present

## 2014-05-29 DIAGNOSIS — G894 Chronic pain syndrome: Secondary | ICD-10-CM | POA: Diagnosis not present

## 2014-06-07 DIAGNOSIS — G894 Chronic pain syndrome: Secondary | ICD-10-CM | POA: Diagnosis not present

## 2014-06-12 DIAGNOSIS — G894 Chronic pain syndrome: Secondary | ICD-10-CM | POA: Diagnosis not present

## 2014-06-13 ENCOUNTER — Ambulatory Visit: Payer: Medicare HMO | Attending: Internal Medicine | Admitting: Internal Medicine

## 2014-06-13 ENCOUNTER — Encounter: Payer: Self-pay | Admitting: Internal Medicine

## 2014-06-13 VITALS — BP 119/76 | HR 98 | Temp 98.0°F | Resp 16 | Wt 255.0 lb

## 2014-06-13 DIAGNOSIS — Z86718 Personal history of other venous thrombosis and embolism: Secondary | ICD-10-CM | POA: Insufficient documentation

## 2014-06-13 DIAGNOSIS — Z79899 Other long term (current) drug therapy: Secondary | ICD-10-CM | POA: Diagnosis not present

## 2014-06-13 DIAGNOSIS — I10 Essential (primary) hypertension: Secondary | ICD-10-CM | POA: Insufficient documentation

## 2014-06-13 DIAGNOSIS — F32A Depression, unspecified: Secondary | ICD-10-CM

## 2014-06-13 DIAGNOSIS — F329 Major depressive disorder, single episode, unspecified: Secondary | ICD-10-CM | POA: Diagnosis not present

## 2014-06-13 DIAGNOSIS — R252 Cramp and spasm: Secondary | ICD-10-CM

## 2014-06-13 DIAGNOSIS — K219 Gastro-esophageal reflux disease without esophagitis: Secondary | ICD-10-CM | POA: Insufficient documentation

## 2014-06-13 DIAGNOSIS — M62838 Other muscle spasm: Secondary | ICD-10-CM | POA: Diagnosis not present

## 2014-06-13 DIAGNOSIS — I8290 Acute embolism and thrombosis of unspecified vein: Secondary | ICD-10-CM

## 2014-06-13 DIAGNOSIS — Z7901 Long term (current) use of anticoagulants: Secondary | ICD-10-CM | POA: Insufficient documentation

## 2014-06-13 DIAGNOSIS — F17210 Nicotine dependence, cigarettes, uncomplicated: Secondary | ICD-10-CM | POA: Insufficient documentation

## 2014-06-13 DIAGNOSIS — M1288 Other specific arthropathies, not elsewhere classified, other specified site: Secondary | ICD-10-CM | POA: Diagnosis not present

## 2014-06-13 DIAGNOSIS — Z720 Tobacco use: Secondary | ICD-10-CM

## 2014-06-13 DIAGNOSIS — F419 Anxiety disorder, unspecified: Secondary | ICD-10-CM | POA: Diagnosis not present

## 2014-06-13 DIAGNOSIS — I2699 Other pulmonary embolism without acute cor pulmonale: Secondary | ICD-10-CM | POA: Diagnosis not present

## 2014-06-13 DIAGNOSIS — Z7951 Long term (current) use of inhaled steroids: Secondary | ICD-10-CM | POA: Insufficient documentation

## 2014-06-13 LAB — COMPLETE METABOLIC PANEL WITH GFR
AST: 16 U/L (ref 0–37)
Albumin: 3.7 g/dL (ref 3.5–5.2)
Alkaline Phosphatase: 69 U/L (ref 39–117)
BILIRUBIN TOTAL: 0.5 mg/dL (ref 0.2–1.2)
BUN: 9 mg/dL (ref 6–23)
CALCIUM: 9.2 mg/dL (ref 8.4–10.5)
CHLORIDE: 100 meq/L (ref 96–112)
CO2: 29 mEq/L (ref 19–32)
Creat: 0.72 mg/dL (ref 0.50–1.10)
GFR, Est African American: >89 mL/min
GFR, Est Non African American: >89 mL/min
Glucose, Bld: 92 mg/dL (ref 70–99)
Potassium: 3.3 mEq/L — ABNORMAL LOW (ref 3.5–5.3)
Sodium: 140 mEq/L (ref 135–145)
Total Protein: 7 g/dL (ref 6.0–8.3)

## 2014-06-13 LAB — POCT INR: INR: 1.9

## 2014-06-13 MED ORDER — WARFARIN SODIUM 5 MG PO TABS: 5.0000 mg | ORAL_TABLET | Freq: Every day | ORAL | Status: DC

## 2014-06-13 MED ORDER — LORAZEPAM 1 MG PO TABS
1.0000 mg | ORAL_TABLET | Freq: Four times a day (QID) | ORAL | Status: DC | PRN
Start: 2014-06-13 — End: 2014-08-25

## 2014-06-13 MED ORDER — OMEPRAZOLE 40 MG PO CPDR: 40.0000 mg | DELAYED_RELEASE_CAPSULE | Freq: Every day | ORAL | Status: DC

## 2014-06-13 MED ORDER — SERTRALINE HCL 100 MG PO TABS: 100.0000 mg | ORAL_TABLET | Freq: Every day | ORAL | Status: DC

## 2014-06-13 NOTE — Patient Instructions (Signed)
Warfarin: What You Need to Know Warfarin is an anticoagulant. Anticoagulants help prevent the formation of blood clots. They also help stop the growth of blood clots. Warfarin is sometimes referred to as a "blood thinner."  Normally, when body tissues are cut or damaged, the blood clots in order to prevent blood loss. Sometimes clots form inside your blood vessels and obstruct the flow of blood through your circulatory system (thrombosis). These clots may travel through your bloodstream and become lodged in smaller blood vessels in your brain, which can cause a stroke, or in your lungs (pulmonary embolism). WHO SHOULD USE WARFARIN? Warfarin is prescribed for people at risk of developing harmful blood clots:  People with surgically implanted mechanical heart valves, irregular heart rhythms called atrial fibrillation, and certain clotting disorders.  People who have developed harmful blood clotting in the past, including those who have had a stroke or a pulmonary embolism, or thrombosis in their legs (deep vein thrombosis [DVT]).  People with an existing blood clot, such as a pulmonary embolism. WARFARIN DOSING Warfarin tablets come in different strengths. Each tablet strength is a different color, with the amount of warfarin (in milligrams) clearly printed on the tablet. If the color of your tablet is different than usual when you receive a new prescription, report it immediately to your pharmacist or health care provider. WARFARIN MONITORING The goal of warfarin therapy is to lessen the clotting tendency of blood but not prevent clotting completely. Your health care provider will monitor the anticoagulation effect of warfarin closely and adjust your dose as needed. For your safety, blood tests called prothrombin time (PT) or international normalized ratio (INR) are used to measure the effects of warfarin. Both of these tests can be done with a finger stick or a blood draw. The longer it takes the  blood to clot, the higher the PT or INR. Your health care provider will inform you of your "target" PT or INR range. If, at any time, your PT or INR is above the target range, there is a risk of bleeding. If your PT or INR is below the target range, there is a risk of clotting. Whether you are started on warfarin while you are in the hospital or in your health care provider's office, you will need to have your PT or INR checked within one week of starting the medicine. Initially, some people are asked to have their PT or INR checked as much as twice a week. Once you are on a stable maintenance dose, the PT or INR is checked less often, usually once every 2 to 4 weeks. The warfarin dose may be adjusted if the PT or INR is not within the target range. It is important to keep all laboratory and health care provider follow-up appointments. Not keeping appointments could result in a chronic or permanent injury, pain, or disability because warfarin is a medicine that requires close monitoring. WHAT ARE THE SIDE EFFECTS OF WARFARIN?  Too much warfarin can cause bleeding (hemorrhage) from any part of the body. This may include bleeding from the gums, blood in the urine, bloody or dark stools, a nosebleed that is not easily stopped, coughing up blood, or vomiting blood.  Too little warfarin can increase the risk of blood clots.  Too little or too much warfarin can also increase the risk of a stroke.  Warfarin use may cause a skin rash or irritation, an unusual fever, continual nausea or stomach upset, or severe pain in your joints or back.   SPECIAL PRECAUTIONS WHILE TAKING WARFARIN Warfarin should be taken exactly as directed. It is very important to take warfarin as directed since bleeding or blood clots could result in chronic or permanent injury, pain, or disability.  Take your medicine at the same time every day. If you forget to take your dose, you can take it if it is within 6 hours of when it was  due.  Do not change the dose of warfarin on your own to make up for missed or extra doses.  If you miss more than 2 doses in a row, you should contact your health care provider for advice. Avoid situations that cause bleeding. You may have a tendency to bleed more easily than usual while taking warfarin. The following actions can limit bleeding:  Using a softer toothbrush.  Flossing with waxed floss rather than unwaxed floss.  Shaving with an electric razor rather than a blade.  Limiting the use of sharp objects.  Avoiding potentially harmful activities, such as contact sports. Warfarin and Pregnancy or Breastfeeding  Warfarin is not advised during the first trimester of pregnancy due to an increased risk of birth defects. In certain situations, a woman may take warfarin after her first trimester of pregnancy. A woman who becomes pregnant or plans to become pregnant while taking warfarin should notify her health care provider immediately.  Although warfarin does not pass into breast milk, a woman who wishes to breastfeed while taking warfarin should also consult with her health care provider. Alcohol, Smoking, and Illicit Drug Use  Alcohol affects how warfarin works in the body. It is best to avoid alcoholic drinks or consume very small amounts while taking warfarin. In general, alcohol intake should be limited to 1 oz (30 mL) of liquor, 6 oz (180 mL) of wine, or 12 oz (360 mL) of beer each day. Notify your health care provider if you change your alcohol intake.  Smoking affects how warfarin works. It is best to avoid smoking while taking warfarin. Notify your health care provider if you change your smoking habits.  It is best to avoid all illicit drugs while taking warfarin since there are few studies that show how warfarin interacts with these drugs. Other Medicines and Dietary Supplements Many prescription and over-the-counter medicines can interfere with warfarin. Be sure all of your  health care providers know you are taking warfarin. Notify your health care provider who prescribed warfarin for you or your pharmacist before starting or stopping any new medicines, including over-the-counter vitamins, dietary supplements, and pain medicines. Your warfarin dose may need to be adjusted. Some common over-the-counter medicines that may increase the risk of bleeding while taking warfarin include:   Acetaminophen.  Aspirin.  Nonsteroidal anti-inflammatory medicines (NSAIDs), such as ibuprofen or naproxen.  Vitamin E. Dietary Considerations  Foods that have moderate or high amounts of vitamin K can interfere with warfarin. Avoid major changes in your diet or notify your health care provider before changing your diet. Eat a consistent amount of foods that have moderate or high amounts of vitamin K. Eating less foods containing vitamin K can increase the risk of bleeding. Eating more foods containing vitamin K can increase the risk of blood clots. Additional questions about dietary considerations can be discussed with a dietitian. Foods that are very high in vitamin K:  Greens, such as Swiss chard and beet, collard, mustard, or turnip greens (fresh or frozen, cooked).  Kale (fresh or frozen, cooked).  Parsley (raw).  Spinach (cooked). Foods that are high   in vitamin K:  Asparagus (frozen, cooked).  Beans, green (frozen, cooked).  Broccoli.  Bok choy (cooked).  Brussels sprouts (fresh or frozen, cooked).  Cabbage (cooked).   Coleslaw. Foods that are moderately high in vitamin K:  Blueberries.  Black-eyed peas.  Endive (raw).  Green leaf lettuce (raw).  Green scallions (raw).  Kale (raw).  Okra (frozen, cooked).  Plantains (fried).  Romaine lettuce (raw).  Sauerkraut (canned).  Spinach (raw). CALL YOUR CLINIC OR HEALTH CARE PROVIDER IF YOU:  Plan to have any surgery or procedure.  Feel sick, especially if you have diarrhea or  vomiting.  Experience or anticipate any major changes in your diet.  Start or stop a prescription or over-the-counter medicine.  Become, plan to become, or think you may be pregnant.  Are having heavier than usual menstrual periods.  Have had a fall, accident, or any symptoms of bleeding or unusual bruising.  Develop an unusual fever. CALL 911 IN THE [place] OR GO TO THE EMERGENCY DEPARTMENT IF YOU:   Think you may be having an allergic reaction to warfarin. The signs of an allergic reaction could include itching, rash, hives, swelling, chest tightness, or trouble breathing.  See signs of blood in your urine. The signs could include reddish, pinkish, or tea-colored urine.  See signs of blood in your stools. The signs could include bright red or black stools.  Vomit or cough up blood. In these instances, the blood could have either a bright red or a "coffee-grounds" appearance.  Have bleeding that will not stop after applying pressure for 30 minutes such as cuts, nosebleeds, or other injuries.  Have severe pain in your joints or back.  Have a new and severe headache.  Have sudden weakness or numbness of your face, arm, or leg, especially on one side of your body.  Have sudden confusion or trouble understanding.  Have sudden trouble seeing in one or both eyes.  Have sudden trouble walking, dizziness, loss of balance, or coordination.  Have trouble speaking or understanding (aphasia). Document Released: 05/26/2005 Document Revised: 10/10/2013 Document Reviewed: 11/19/2012 [employer] Patient Information 2015 [employer]. This information is not intended to replace advice given to you by your health care provider. Make sure you discuss any questions you have with your health care provider.  

## 2014-06-13 NOTE — Progress Notes (Signed)
MRN: [id number] [name]  Sex: female Age: 59 y.o. DOB: 1955/08/08  Allergies: Kiwi extract and Aspirin  Chief Complaint  Patient presents with  . Back Pain    HPI: Patient is 59 y.o. 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 Patient also has chronic lower back pain, had MRI done last year which reported facet arthropathy and bulging of discs at L4-5 and L5-S1. Which could cause neuronal irritation without nerve compression, As per patient she is following up with pain management/physical therapy and is taking narcotic pain medication, she does complain of some muscle cramps, previous blood work reviewed her noticed low potassium level, needs to be checked again.patient also history of anxiety/depression, she is requesting refill on her medication.  Past Medical History  Diagnosis Date  . Hypertension   . Asthma   . COPD (chronic obstructive pulmonary disease)   . Anxiety   . Cancer   . Colon cancer   . Dyslipidemia   . Migraine headache   . Chronic bronchitis   . Uterine fibroid   . Ovarian cyst   . GERD (gastroesophageal reflux disease)   . Morbid obesity   . Obstructive sleep apnea     mild  . Chronic pain   . Chronic back pain   . Arthritis   . Osteoarthritis   . Depression   . DVT (deep vein thrombosis) in pregnancy     Past Surgical History  Procedure Laterality Date  . Knee arthroscopy      bil.  . Carpal tunnel release      rt  . Mouth surgery      teeth extraction  . Cesarean section    . Subtotal colectomy    . Colonoscopy    . Tubal ligation        Medication List       This list is accurate as of: 06/13/14 12:43 PM.  Always use your most recent med list.             albuterol 108 (90 BASE) MCG/ACT inhaler  Commonly known as:  PROVENTIL HFA;VENTOLIN HFA  Inhale 2 puffs into the lungs every 6 (six) hours as needed for wheezing or shortness of breath (shortness of breath).     fluticasone 110 MCG/ACT inhaler  Commonly known as:  FLOVENT HFA  Inhale 2 puffs into the lungs every 12 (twelve) hours.     furosemide 20 MG tablet  Commonly known as:  LASIX  Take 1 tablet (20 mg total) by mouth daily.     furosemide 20 MG tablet  Commonly known as:  LASIX  Take 1 tablet (20 mg total) by mouth 2 (two) times daily.     LORazepam 1 MG tablet  Commonly known as:  ATIVAN  Take 1 tablet (1 mg total) by mouth every 6 (six) hours as needed for anxiety (anxiety).     omeprazole 40 MG capsule  Commonly known as:  PRILOSEC  Take 1 capsule (40 mg total) by mouth daily.     oxyCODONE-acetaminophen 5-325 MG per tablet  Commonly known as:  PERCOCET/ROXICET  Take 1-2 tablets by mouth every 4 (four) hours as needed for moderate pain or severe pain (leg & back  pain).     oxyCODONE-acetaminophen 5-325 MG per tablet  Commonly known as:  PERCOCET/ROXICET  Take 1-2 tablets by mouth every 6 (six) hours as needed for severe pain.     potassium chloride SA 20 MEQ tablet  Commonly known as:  K-DUR,KLOR-CON  Take 0.5 tablets (10 mEq total) by mouth daily.     sertraline 100 MG tablet  Commonly known as:  ZOLOFT  Take 1 tablet (100 mg total) by mouth daily.     traZODone 50 MG tablet  Commonly known as:  DESYREL  Take 1 tablet (50 mg total) by mouth at bedtime.     warfarin 5 MG tablet  Commonly known as:  COUMADIN  Take 1 tablet (5 mg total) by mouth daily.        Meds ordered this encounter  Medications  . warfarin (COUMADIN) 5 MG tablet    Sig: Take 1 tablet (5 mg total) by mouth daily.    Dispense:  30 tablet    Refill:  2  . sertraline (ZOLOFT) 100 MG tablet    Sig: Take 1 tablet (100 mg total) by mouth daily.    Dispense:  30 tablet    Refill:  3    . omeprazole (PRILOSEC) 40 MG capsule    Sig: Take 1 capsule (40 mg total) by mouth daily.    Dispense:  30 capsule    Refill:  3  . LORazepam (ATIVAN) 1 MG tablet    Sig: Take 1 tablet (1 mg total) by mouth every 6 (six) hours as needed for anxiety (anxiety).    Dispense:  30 tablet    Refill:  0    Immunization History  Administered Date(s) Administered  . Influenza Split 08/19/2012  . Influenza,inj,Quad PF,36+ Mos 04/20/2013, 02/08/2014  . Pneumococcal Polysaccharide-23 10/24/2011, 08/19/2012    Family History  Problem Relation Age of Onset  . Uterine cancer Mother   . Stroke Father   . Colon cancer Maternal Uncle   . Diabetes Father   . Ovarian cancer Mother   . Hypertension Father   . Breast cancer Maternal Grandmother   . Diabetes Sister   . Asthma Child   . Asthma Child     History  Substance Use Topics  . Smoking status: Current Some Day Smoker -- 1.00 packs/day for 37 years    Types: Cigarettes  . Smokeless tobacco: Never Used     Comment: trying to quit, down to .5ppd X2 years ago.   . Alcohol Use: No    Review of Systems   As noted in HPI  Filed Vitals:   06/13/14 1208  BP: 119/76  Pulse: 98  Temp: 98 F (36.7 C)  Resp: 16    Physical Exam  Physical Exam  Constitutional: No distress.  Eyes: EOM are normal. Pupils are equal, round, and reactive to light.  Cardiovascular: Normal rate and regular rhythm.   Pulmonary/Chest: Breath sounds normal. No respiratory distress. She has no wheezes. She has no rales.  Musculoskeletal:  Minimal lower lumbar paraspinal tenderness, equal strength both lower extremities     CBC    Component Value Date/Time   WBC 6.0 03/28/2014 1002   RBC 4.29 03/28/2014 1002   HGB 14.3 03/28/2014 1002   HCT 42.9 03/28/2014 1002   PLT 148* 03/28/2014 1002   MCV 100.0 03/28/2014 1002   LYMPHSABS 1.8 03/28/2014 1002   MONOABS 0.5 03/28/2014 1002   EOSABS 0.1 03/28/2014 1002   BASOSABS 0.0 03/28/2014 1002  CMP     Component Value Date/Time   NA 144 03/28/2014 1002   K 3.0* 03/28/2014 1002   CL 100 03/28/2014 1002   CO2 31 03/28/2014 1002   GLUCOSE 108* 03/28/2014 1002   BUN 6 03/28/2014 1002   CREATININE 0.72 03/28/2014 1002   CREATININE 0.80 09/28/2013 1228   CALCIUM 8.8 03/28/2014 1002   PROT 7.1 02/14/2014 1554   ALBUMIN 3.3* 02/14/2014 1554   AST 16 02/14/2014 1554   ALT 13 02/14/2014 1554   ALKPHOS 74 02/14/2014 1554   BILITOT <0.2* 02/14/2014 1554   GFRNONAA >90 03/28/2014 1002   GFRNONAA 83 09/28/2013 1228   GFRAA >90 03/28/2014 1002   GFRAA >89 09/28/2013 1228    Lab Results  Component Value Date/Time   CHOL 161 10/22/2011 05:53 AM    No components found for: HGA1C  Lab Results  Component Value Date/Time   AST 16 02/14/2014 03:54 PM    Assessment and Plan  Clot - Plan:  Results for orders placed or performed in visit on 06/13/14  INR  Result Value Ref Range   INR 1.9    INR is borderline low, I have given the patient hand out for diet modification once she is taking Coumadin, she will come back in one week time for repeat INR if persistently low consider increasing the dose of Coumadin warfarin (COUMADIN) 5 MG tablet  Depression - Plan: sertraline (ZOLOFT) 100 MG tablet, Ambulatory referral to Psychiatry  Essential hypertension - Plan: blood pressure is well controlled, continue with current meds, repeat COMPLETE METABOLIC PANEL WITH GFR  Spasm - Plan: Will check blood chemistry COMPLETE METABOLIC PANEL WITH GFR  Tobacco abuse Advised patient to quit smoking.  Anxiety - Plan: LORazepam (ATIVAN) 1 MG tablet, Ambulatory referral to Psychiatry  Gastroesophageal reflux disease, esophagitis presence not specified - Plan:lifestyle modification, continue with omeprazole (PRILOSEC) 40 MG capsule   Health Maintenance  -Vaccinations:  -up-to-date with flu shot  Return in about 3 months (around 09/12/2014) for hypertension.  Lorayne Marek, MD

## 2014-06-13 NOTE — Progress Notes (Signed)
Patient here for follow up  Complains of feeling tired  Patient states had a spasm this am that shot down her left leg

## 2014-06-15 ENCOUNTER — Emergency Department (HOSPITAL_COMMUNITY)
Admission: EM | Admit: 2014-06-15 | Discharge: 2014-06-15 | Disposition: A | Discharge status: Home / Self Care | Payer: Commercial Managed Care - HMO | Attending: Emergency Medicine | Admitting: Emergency Medicine

## 2014-06-15 ENCOUNTER — Encounter (HOSPITAL_COMMUNITY): Payer: Self-pay | Admitting: Emergency Medicine

## 2014-06-15 ENCOUNTER — Emergency Department (HOSPITAL_COMMUNITY): Payer: Commercial Managed Care - HMO

## 2014-06-15 ENCOUNTER — Other Ambulatory Visit: Payer: Self-pay

## 2014-06-15 DIAGNOSIS — Z8669 Personal history of other diseases of the nervous system and sense organs: Secondary | ICD-10-CM | POA: Diagnosis not present

## 2014-06-15 DIAGNOSIS — F419 Anxiety disorder, unspecified: Secondary | ICD-10-CM | POA: Insufficient documentation

## 2014-06-15 DIAGNOSIS — I10 Essential (primary) hypertension: Secondary | ICD-10-CM | POA: Insufficient documentation

## 2014-06-15 DIAGNOSIS — K219 Gastro-esophageal reflux disease without esophagitis: Secondary | ICD-10-CM | POA: Diagnosis not present

## 2014-06-15 DIAGNOSIS — Z79899 Other long term (current) drug therapy: Secondary | ICD-10-CM | POA: Diagnosis not present

## 2014-06-15 DIAGNOSIS — Z8742 Personal history of other diseases of the female genital tract: Secondary | ICD-10-CM | POA: Insufficient documentation

## 2014-06-15 DIAGNOSIS — Z86711 Personal history of pulmonary embolism: Secondary | ICD-10-CM | POA: Insufficient documentation

## 2014-06-15 DIAGNOSIS — Z720 Tobacco use: Secondary | ICD-10-CM | POA: Diagnosis not present

## 2014-06-15 DIAGNOSIS — M1990 Unspecified osteoarthritis, unspecified site: Secondary | ICD-10-CM | POA: Insufficient documentation

## 2014-06-15 DIAGNOSIS — Z86718 Personal history of other venous thrombosis and embolism: Secondary | ICD-10-CM | POA: Diagnosis not present

## 2014-06-15 DIAGNOSIS — J441 Chronic obstructive pulmonary disease with (acute) exacerbation: Secondary | ICD-10-CM | POA: Diagnosis not present

## 2014-06-15 DIAGNOSIS — R1012 Left upper quadrant pain: Secondary | ICD-10-CM | POA: Diagnosis not present

## 2014-06-15 DIAGNOSIS — R1032 Left lower quadrant pain: Secondary | ICD-10-CM | POA: Diagnosis not present

## 2014-06-15 DIAGNOSIS — E876 Hypokalemia: Secondary | ICD-10-CM

## 2014-06-15 DIAGNOSIS — Z85038 Personal history of other malignant neoplasm of large intestine: Secondary | ICD-10-CM | POA: Insufficient documentation

## 2014-06-15 DIAGNOSIS — R0789 Other chest pain: Secondary | ICD-10-CM

## 2014-06-15 DIAGNOSIS — Z859 Personal history of malignant neoplasm, unspecified: Secondary | ICD-10-CM | POA: Insufficient documentation

## 2014-06-15 DIAGNOSIS — F329 Major depressive disorder, single episode, unspecified: Secondary | ICD-10-CM | POA: Insufficient documentation

## 2014-06-15 DIAGNOSIS — M546 Pain in thoracic spine: Secondary | ICD-10-CM | POA: Insufficient documentation

## 2014-06-15 DIAGNOSIS — G8929 Other chronic pain: Secondary | ICD-10-CM

## 2014-06-15 DIAGNOSIS — R079 Chest pain, unspecified: Secondary | ICD-10-CM | POA: Diagnosis not present

## 2014-06-15 DIAGNOSIS — R1011 Right upper quadrant pain: Secondary | ICD-10-CM

## 2014-06-15 DIAGNOSIS — Z7901 Long term (current) use of anticoagulants: Secondary | ICD-10-CM | POA: Insufficient documentation

## 2014-06-15 LAB — COMPREHENSIVE METABOLIC PANEL
ALT: 12 U/L (ref 0–35)
ANION GAP: 12 (ref 5–15)
AST: 22 U/L (ref 0–37)
Albumin: 4.2 g/dL (ref 3.5–5.2)
Alkaline Phosphatase: 78 U/L (ref 39–117)
BILIRUBIN TOTAL: 0.4 mg/dL (ref 0.3–1.2)
BUN: 8 mg/dL (ref 6–23)
CO2: 31 mmol/L (ref 19–32)
Calcium: 9.5 mg/dL (ref 8.4–10.5)
Chloride: 95 mEq/L — ABNORMAL LOW (ref 96–112)
Creatinine, Ser: 0.84 mg/dL (ref 0.50–1.10)
GFR, EST AFRICAN AMERICAN: 88 mL/min — AB (ref >90)
GFR, EST NON AFRICAN AMERICAN: 76 mL/min — AB (ref >90)
GLUCOSE: 117 mg/dL — AB (ref 70–99)
POTASSIUM: 2.6 mmol/L — AB (ref 3.5–5.1)
Sodium: 138 mmol/L (ref 135–145)
TOTAL PROTEIN: 8.5 g/dL — AB (ref 6.0–8.3)

## 2014-06-15 LAB — CBC
HCT: 49.3 % — ABNORMAL HIGH (ref 36.0–46.0)
Hemoglobin: 16 g/dL — ABNORMAL HIGH (ref 12.0–15.0)
MCH: 31.7 pg (ref 26.0–34.0)
MCHC: 32.5 g/dL (ref 30.0–36.0)
MCV: 97.6 fL (ref 78.0–100.0)
Platelets: 230 K/uL (ref 150–400)
RBC: 5.05 MIL/uL (ref 3.87–5.11)
RDW: 13.9 % (ref 11.5–15.5)
WBC: 4.8 K/uL (ref 4.0–10.5)

## 2014-06-15 LAB — URINALYSIS, ROUTINE W REFLEX MICROSCOPIC
GLUCOSE, UA: NEGATIVE mg/dL
Hgb urine dipstick: NEGATIVE
Ketones, ur: NEGATIVE mg/dL
LEUKOCYTES UA: NEGATIVE
Nitrite: NEGATIVE
Protein, ur: 30 mg/dL — AB
Specific Gravity, Urine: 1.025 (ref 1.005–1.030)
Urobilinogen, UA: 1 mg/dL (ref 0.0–1.0)
pH: 7 (ref 5.0–8.0)

## 2014-06-15 LAB — URINE MICROSCOPIC-ADD ON

## 2014-06-15 LAB — I-STAT TROPONIN, ED
Troponin i, poc: 0 ng/mL (ref 0.00–0.08)
Troponin i, poc: 0 ng/mL (ref 0.00–0.08)

## 2014-06-15 LAB — PROTIME-INR
INR: 1.63 — ABNORMAL HIGH (ref 0.00–1.49)
Prothrombin Time: 19.5 s — ABNORMAL HIGH (ref 11.6–15.2)

## 2014-06-15 LAB — I-STAT CG4 LACTIC ACID, ED: Lactic Acid, Venous: 2.02 mmol/L (ref 0.5–2.2)

## 2014-06-15 MED ORDER — POTASSIUM CHLORIDE CRYS ER 20 MEQ PO TBCR
40.0000 meq | EXTENDED_RELEASE_TABLET | Freq: Once | ORAL | Status: AC
  Administered 2014-06-15: 40 meq via ORAL
  Filled 2014-06-15: qty 2

## 2014-06-15 MED ORDER — IOHEXOL 350 MG/ML SOLN
100.0000 mL | Freq: Once | INTRAVENOUS | Status: AC | PRN
  Administered 2014-06-15: 100 mL via INTRAVENOUS

## 2014-06-15 MED ORDER — HYDROMORPHONE HCL 1 MG/ML IJ SOLN
1.0000 mg | Freq: Once | INTRAMUSCULAR | Status: AC
  Administered 2014-06-15: 1 mg via INTRAVENOUS
  Filled 2014-06-15: qty 1

## 2014-06-15 MED ORDER — POTASSIUM CHLORIDE CRYS ER 20 MEQ PO TBCR: 40.0000 meq | EXTENDED_RELEASE_TABLET | Freq: Every day | ORAL | Status: DC

## 2014-06-15 MED ORDER — IOHEXOL 300 MG/ML  SOLN
100.0000 mL | Freq: Once | INTRAMUSCULAR | Status: AC | PRN
  Administered 2014-06-15: 100 mL via INTRAVENOUS

## 2014-06-15 MED ORDER — CYCLOBENZAPRINE HCL 10 MG PO TABS: 10.0000 mg | ORAL_TABLET | Freq: Two times a day (BID) | ORAL | Status: DC | PRN

## 2014-06-15 MED ORDER — SODIUM CHLORIDE 0.9 % IV BOLUS (SEPSIS)
500.0000 mL | Freq: Once | INTRAVENOUS | Status: AC
  Administered 2014-06-15: 500 mL via INTRAVENOUS

## 2014-06-15 MED ORDER — IOHEXOL 300 MG/ML  SOLN
50.0000 mL | Freq: Once | INTRAMUSCULAR | Status: AC | PRN
  Administered 2014-06-15: 50 mL via ORAL

## 2014-06-15 MED ORDER — CYCLOBENZAPRINE HCL 10 MG PO TABS
10.0000 mg | ORAL_TABLET | Freq: Once | ORAL | Status: AC
  Administered 2014-06-15: 10 mg via ORAL
  Filled 2014-06-15: qty 1

## 2014-06-15 NOTE — ED Notes (Signed)
Pt states her back is still hurting and is requesting some pain meds. Will notify Rn

## 2014-06-15 NOTE — ED Notes (Signed)
Patient transported to CT 

## 2014-06-15 NOTE — Discharge Instructions (Signed)
Abdominal Pain, Women °Abdominal (stomach, pelvic, or belly) pain can be caused by many things. It is important to tell your doctor: °· The location of the pain. °· Does it come and go or is it present all the time? °· Are there things that start the pain (eating certain foods, exercise)? °· Are there other symptoms associated with the pain (fever, nausea, vomiting, diarrhea)? °All of this is helpful to know when trying to find the cause of the pain. °CAUSES  °· Stomach: virus or bacteria infection, or ulcer. °· Intestine: appendicitis (inflamed appendix), regional ileitis (Crohn's disease), ulcerative colitis (inflamed colon), irritable bowel syndrome, diverticulitis (inflamed diverticulum of the colon), or cancer of the stomach or intestine. °· Gallbladder disease or stones in the gallbladder. °· Kidney disease, kidney stones, or infection. °· Pancreas infection or cancer. °· Fibromyalgia (pain disorder). °· Diseases of the female organs: °· Uterus: fibroid (non-cancerous) tumors or infection. °· Fallopian tubes: infection or tubal pregnancy. °· Ovary: cysts or tumors. °· Pelvic adhesions (scar tissue). °· Endometriosis (uterus lining tissue growing in the pelvis and on the pelvic organs). °· Pelvic congestion syndrome (female organs filling up with blood just before the menstrual period). °· Pain with the menstrual period. °· Pain with ovulation (producing an egg). °· Pain with an IUD (intrauterine device, birth control) in the uterus. °· Cancer of the female organs. °· Functional pain (pain not caused by a disease, may improve without treatment). °· Psychological pain. °· Depression. °DIAGNOSIS  °Your doctor will decide the seriousness of your pain by doing an examination. °· Blood tests. °· X-rays. °· Ultrasound. °· CT scan (computed tomography, special type of X-ray). °· MRI (magnetic resonance imaging). °· Cultures, for infection. °· Barium enema (dye inserted in the large intestine, to better view it with  X-rays). °· Colonoscopy (looking in intestine with a lighted tube). °· Laparoscopy (minor surgery, looking in abdomen with a lighted tube). °· Major abdominal exploratory surgery (looking in abdomen with a large incision). °TREATMENT  °The treatment will depend on the cause of the pain.  °· Many cases can be observed and treated at home. °· Over-the-counter medicines recommended by your caregiver. °· Prescription medicine. °· Antibiotics, for infection. °· Birth control pills, for painful periods or for ovulation pain. °· Hormone treatment, for endometriosis. °· Nerve blocking injections. °· Physical therapy. °· Antidepressants. °· Counseling with a psychologist or psychiatrist. °· Minor or major surgery. °HOME CARE INSTRUCTIONS  °· Do not take laxatives, unless directed by your caregiver. °· Take over-the-counter pain medicine only if ordered by your caregiver. Do not take aspirin because it can cause an upset stomach or bleeding. °· Try a clear liquid diet (broth or water) as ordered by your caregiver. Slowly move to a bland diet, as tolerated, if the pain is related to the stomach or intestine. °· Have a thermometer and take your temperature several times a day, and record it. °· Bed rest and sleep, if it helps the pain. °· Avoid sexual intercourse, if it causes pain. °· Avoid stressful situations. °· Keep your follow-up appointments and tests, as your caregiver orders. °· If the pain does not go away with medicine or surgery, you may try: °· Acupuncture. °· Relaxation exercises (yoga, meditation). °· Group therapy. °· Counseling. °SEEK MEDICAL CARE IF:  °· You notice certain foods cause stomach pain. °· Your home care treatment is not helping your pain. °· You need stronger pain medicine. °· You want your IUD removed. °· You feel faint or   lightheaded.  You develop nausea and vomiting.  You develop a rash.  You are having side effects or an allergy to your medicine. SEEK IMMEDIATE MEDICAL CARE IF:   Your  pain does not go away or gets worse.  You have a fever.  Your pain is felt only in portions of the abdomen. The right side could possibly be appendicitis. The left lower portion of the abdomen could be colitis or diverticulitis.  You are passing blood in your stools (bright red or black tarry stools, with or without vomiting).  You have blood in your urine.  You develop chills, with or without a fever.  You pass out. MAKE SURE YOU:   Understand these instructions.  Will watch your condition.  Will get help right away if you are not doing well or get worse. Document Released: 03/23/2007 Document Revised: 10/10/2013 Document Reviewed: 04/12/2009 [facility] Patient Information 2015 [place], [place]. This information is not intended to replace advice given to you by your health care provider. Make sure you discuss any questions you have with your health care provider.  Hypokalemia Hypokalemia means that the amount of potassium in the blood is lower than normal.Potassium is a chemical, called an electrolyte, that helps regulate the amount of fluid in the body. It also stimulates muscle contraction and helps nerves function properly.Most of the body's potassium is inside of cells, and only a very small amount is in the blood. Because the amount in the blood is so small, minor changes can be life-threatening. CAUSES  Antibiotics.  Diarrhea or vomiting.  Using laxatives too much, which can cause diarrhea.  Chronic kidney disease.  Water pills (diuretics).  Eating disorders (bulimia).  Low magnesium level.  Sweating a lot. SIGNS AND SYMPTOMS  Weakness.  Constipation.  Fatigue.  Muscle cramps.  Mental confusion.  Skipped heartbeats or irregular heartbeat (palpitations).  Tingling or numbness. DIAGNOSIS  Your health care provider can diagnose hypokalemia with blood tests. In addition to checking your potassium level, your health care provider may also check other lab  tests. TREATMENT Hypokalemia can be treated with potassium supplements taken by mouth or adjustments in your current medicines. If your potassium level is very low, you may need to get potassium through a vein (IV) and be monitored in the hospital. A diet high in potassium is also helpful. Foods high in potassium are:  Nuts, such as peanuts and pistachios.  Seeds, such as sunflower seeds and pumpkin seeds.  Peas, lentils, and lima beans.  Whole grain and bran cereals and breads.  Fresh fruit and vegetables, such as apricots, avocado, bananas, cantaloupe, kiwi, oranges, tomatoes, asparagus, and potatoes.  Orange and tomato juices.  Red meats.  Fruit yogurt. HOME CARE INSTRUCTIONS  Take all medicines as prescribed by your health care provider.  Maintain a healthy diet by including nutritious food, such as fruits, vegetables, nuts, whole grains, and lean meats.  If you are taking a laxative, be sure to follow the directions on the label. SEEK MEDICAL CARE IF:  Your weakness gets worse.  You feel your heart pounding or racing.  You are vomiting or having diarrhea.  You are diabetic and having trouble keeping your blood glucose in the normal range. SEEK IMMEDIATE MEDICAL CARE IF:  You have chest pain, shortness of breath, or dizziness.  You are vomiting or having diarrhea for more than 2 days.  You faint. MAKE SURE YOU:   Understand these instructions.  Will watch your condition.  Will get help right away  if you are not doing well or get worse. Document Released: 05/26/2005 Document Revised: 03/16/2013 Document Reviewed: 11/26/2012 [facility] Patient Information 2015 [place], [place]. This information is not intended to replace advice given to you by your health care provider. Make sure you discuss any questions you have with your health care provider.

## 2014-06-15 NOTE — ED Notes (Signed)
Pt c/o lower right abd pain and chest pain, was seen at doctor for but no relief. Pt states she has not had an appetite for the past couple of months and recently started having diarrhea to the point where her rectum is hurting.

## 2014-06-15 NOTE — ED Provider Notes (Signed)
CSN: [id number]     Arrival date & time 06/15/14  0906 History   First MD Initiated Contact with Patient 06/15/14 0915     No chief complaint on file.    (Consider location/radiation/quality/duration/timing/severity/associated sxs/prior Treatment) HPI Comments: Recently had a subtherapeutic INR - is on Coumadin for blood clots. Back pain began a few days ago. Began as nausea, which became more back pain. Also complaining of sharp central chest pain. CP is nonradiating, no alleviating/exacerbating factors, no pleuritic component.  Patient is a 59 y.o. female presenting with abdominal pain. The history is provided by the patient.  Abdominal Pain Pain location:  LLQ and LUQ Pain quality: aching and sharp   Pain severity:  Moderate Onset quality:  Gradual Timing:  Constant Progression:  Unchanged Chronicity:  New Context: not recent illness and not sick contacts   Relieved by:  Nothing Worsened by:  Nothing tried Associated symptoms: shortness of breath   Associated symptoms: no cough and no fever     Past Medical History  Diagnosis Date  . Hypertension   . Asthma   . COPD (chronic obstructive pulmonary disease)   . Anxiety   . Cancer   . Colon cancer   . Dyslipidemia   . Migraine headache   . Chronic bronchitis   . Uterine fibroid   . Ovarian cyst   . GERD (gastroesophageal reflux disease)   . Morbid obesity   . Obstructive sleep apnea     mild  . Chronic pain   . Chronic back pain   . Arthritis   . Osteoarthritis   . Depression   . DVT (deep vein thrombosis) in pregnancy    Past Surgical History  Procedure Laterality Date  . Knee arthroscopy      bil.  . Carpal tunnel release      rt  . Mouth surgery      teeth extraction  . Cesarean section    . Subtotal colectomy    . Colonoscopy    . Tubal ligation     Family History  Problem Relation Age of Onset  . Uterine cancer Mother   . Stroke Father   . Colon cancer Maternal Uncle   . Diabetes Father   .  Ovarian cancer Mother   . Hypertension Father   . Breast cancer Maternal Grandmother   . Diabetes Sister   . Asthma Child   . Asthma Child    History  Substance Use Topics  . Smoking status: Current Some Day Smoker -- 1.00 packs/day for 37 years    Types: Cigarettes  . Smokeless tobacco: Never Used     Comment: trying to quit, down to .5ppd X2 years ago.   . Alcohol Use: No   OB History    Gravida Para Term Preterm AB TAB SAB Ectopic Multiple Living   2 2 2       2      Review of Systems  Constitutional: Negative for fever.  Respiratory: Positive for shortness of breath. Negative for cough.   Gastrointestinal: Positive for abdominal pain.  All other systems reviewed and are negative.     Allergies  Kiwi extract and Aspirin  Home Medications   Prior to Admission medications   Medication Sig Start Date End Date Taking? Authorizing Provider  albuterol (PROVENTIL HFA;VENTOLIN HFA) 108 (90 BASE) MCG/ACT inhaler Inhale 2 puffs into the lungs every 6 (six) hours as needed for wheezing or shortness of breath (shortness of breath). 02/20/14   Frederich Chick  Toney Sang, MD  fluticasone (FLOVENT HFA) 110 MCG/ACT inhaler Inhale 2 puffs into the lungs every 12 (twelve) hours. 01/17/14   Lorayne Marek, MD  furosemide (LASIX) 20 MG tablet Take 1 tablet (20 mg total) by mouth daily. 02/20/14   Oswald Hillock, MD  furosemide (LASIX) 20 MG tablet Take 1 tablet (20 mg total) by mouth 2 (two) times daily. 03/28/14   Merryl Hacker, MD  LORazepam (ATIVAN) 1 MG tablet Take 1 tablet (1 mg total) by mouth every 6 (six) hours as needed for anxiety (anxiety). 06/13/14   Lorayne Marek, MD  omeprazole (PRILOSEC) 40 MG capsule Take 1 capsule (40 mg total) by mouth daily. 06/13/14   Lorayne Marek, MD  oxyCODONE-acetaminophen (PERCOCET/ROXICET) 5-325 MG per tablet Take 1-2 tablets by mouth every 4 (four) hours as needed for moderate pain or severe pain (leg & back pain). 02/20/14   Oswald Hillock, MD  oxyCODONE-acetaminophen  (PERCOCET/ROXICET) 5-325 MG per tablet Take 1-2 tablets by mouth every 6 (six) hours as needed for severe pain. 03/28/14   Merryl Hacker, MD  potassium chloride SA (K-DUR,KLOR-CON) 20 MEQ tablet Take 0.5 tablets (10 mEq total) by mouth daily. 02/20/14   Oswald Hillock, MD  sertraline (ZOLOFT) 100 MG tablet Take 1 tablet (100 mg total) by mouth daily. 06/13/14   Lorayne Marek, MD  traZODone (DESYREL) 50 MG tablet Take 1 tablet (50 mg total) by mouth at bedtime. 01/17/14   Lorayne Marek, MD  warfarin (COUMADIN) 5 MG tablet Take 1 tablet (5 mg total) by mouth daily. 06/13/14   Lorayne Marek, MD   LMP 05/24/2003 Physical Exam  Constitutional: She is oriented to person, place, and time. She appears well-developed and well-nourished. No distress.  HENT:  Head: Normocephalic and atraumatic.  Mouth/Throat: Oropharynx is clear and moist.  Eyes: EOM are normal. Pupils are equal, round, and reactive to light.  Neck: Normal range of motion. Neck supple.  Cardiovascular: Normal rate and regular rhythm.  Exam reveals no friction rub.   No murmur heard. Pulmonary/Chest: Effort normal and breath sounds normal. No respiratory distress. She has no wheezes. She has no rales.    Abdominal: Soft. She exhibits no distension. There is tenderness (LUQ, LLQ). There is no rebound.  Musculoskeletal: Normal range of motion. She exhibits no edema.       Back:  Neurological: She is alert and oriented to person, place, and time.  Skin: She is not diaphoretic.  Nursing note and vitals reviewed.   ED Course  Procedures (including critical care time) Labs Review Labs Reviewed  CBC  COMPREHENSIVE METABOLIC PANEL  PROTIME-INR  URINALYSIS, ROUTINE W REFLEX MICROSCOPIC  I-STAT [place], ED  I-STAT CG4 LACTIC ACID, ED    Imaging Review Dg Chest 2 View  06/15/2014   CLINICAL DATA:  59 year old female with right abdominal and chest pain. Diarrhea. Initial encounter.  EXAM: CHEST  2 VIEW  COMPARISON:  03/28/2014 chest  x-ray.  02/24/2014 chest CT.  FINDINGS: Mild cardiomegaly.  Pulmonary vascular prominence most notable centrally.  No segmental consolidation or pneumothorax.  Limited by plain film examination for evaluating for pulmonary embolus (which patient was noted to have on prior CT).  Mildly tortuous aorta.  IMPRESSION: Mild cardiomegaly.  Pulmonary vascular prominence most notable centrally.  No segmental consolidation.  Limited by plain film examination for evaluating for pulmonary embolus (which patient was noted to have on prior CT).  Mildly tortuous aorta.   Electronically Signed   By: Chauncey Cruel  M.D.   On: 06/15/2014 10:15   Ct Angio Chest Pe W/cm &/or Wo Cm  06/15/2014   CLINICAL DATA:  Left chest and arm pain.  EXAM: CT ANGIOGRAPHY CHEST WITH CONTRAST  TECHNIQUE: Multidetector CT imaging of the chest was performed using the standard protocol during bolus administration of intravenous contrast. Multiplanar CT image reconstructions and MIPs were obtained to evaluate the vascular anatomy.  CONTRAST:  100 mL OMNIPAQUE IOHEXOL 350 MG/ML SOLN  COMPARISON:  CT chest 02/24/2014.  FINDINGS: No pulmonary embolus is identified. Previously seen pulmonary emboli are no longer identified. Heart size is upper normal. No pleural or pericardial effusion. There is no axillary, hilar or mediastinal lymphadenopathy. The lungs are clear. Visualized upper abdomen shows no focal abnormality. Thoracic spondylosis is noted. Degenerative disease is also seen about the shoulders. No lytic or sclerotic bony lesion is identified.  Review of the MIP images confirms the above findings.  IMPRESSION: Negative for pulmonary embolus. Pulmonary emboli seen on the prior examination or are no longer identified. No acute finding or finding to explain the patient's symptoms.  Thoracic spondylosis and degenerative change about the shoulders.   Electronically Signed   By: Inge Rise M.D.   On: 06/15/2014 16:41   Ct Abdomen Pelvis W  Contrast  06/15/2014   CLINICAL DATA:  59 year old female with right mid to lower abdominal pain for 2 days. Decreased appetite over the past couple of months. Recent diarrhea. History of colon cancer (post partial colonic resection), hypertension, right lower extremity deep venous thrombosis, obesity. Initial encounter.  EXAM: CT ABDOMEN AND PELVIS WITH CONTRAST  TECHNIQUE: Multidetector CT imaging of the abdomen and pelvis was performed using the standard protocol following bolus administration of intravenous contrast.  CONTRAST:  6mL OMNIPAQUE IOHEXOL 300 MG/ML SOLN, 166mL OMNIPAQUE IOHEXOL 300 MG/ML SOLN  COMPARISON:  03/18/2006 CT.  FINDINGS: Lung bases clear.  Enlarged heart.  Atherosclerotic type changes aorta, aortic branch vessels and iliac arteries with areas of mild narrowing and slight ectasia without abdominal aortic aneurysm or high-grade stenosis.  Post resection of colon with reanastomosis rectosigmoid region without obvious mass, extra luminal bowel inflammatory process, free fluid or free air. Mild diastases rectus muscles without bowel containing hernia.  No adenopathy.  Degenerative changes thoracic and lumbar spine with various degrees of spinal stenosis and foraminal narrowing without osseous destructive lesion. Sacroiliac joint degenerative changes.  Contracted urinary bladder. No obvious uterine or adnexal abnormality.  Fatty liver without focal worrisome hepatic, splenic, pancreatic, adrenal or renal lesion. Left renal 2.4 cm cyst and possibly sub cm cyst (too small to characterize).  IMPRESSION: Post resection of colon with reanastomosis rectosigmoid region without obvious mass, extra luminal bowel inflammatory process, free fluid or free air.  No adenopathy.  Degenerative changes thoracic and lumbar spine with various degrees of spinal stenosis and foraminal narrowing without osseous destructive lesion. Sacroiliac joint degenerative changes.  Fatty liver.  Left renal 2.4 cm cyst and  possibly sub cm cyst (too small to characterize).   Electronically Signed   By: Chauncey Cruel M.D.   On: 06/15/2014 12:49     EKG Interpretation   Date/Time:  Thursday June 15 2014 09:41:15 EST Ventricular Rate:  94 PR Interval:  137 QRS Duration: 154 QT Interval:  369 QTC Calculation: 461 R Axis:   35 Text Interpretation:  Sinus rhythm Ventricular trigeminy Nonspecific  intraventricular conduction delay Baseline wander in lead(s) I II aVR  Multiple PVCs, similar to prior No significant change since last tracing  Confirmed by Mingo Amber  MD, [place] [phone]) on 06/15/2014 9:45:35 AM      MDM   Final diagnoses:  Chest pain  Hypokalemia    59 year old female here with multiple complaints. History of colon cancer and is on Coumadin for PE. Patient began with nausea a few days ago, now complains of back pain, abdominal pain, shortness of breath, chest pain. Chest pain is sharp and central, nonradiating, nonpleuritic. She complains of mild shortness of breath. On exam she has severe central chest tenderness, left-sided thoracic back tenderness, left-sided abdominal pain. All symptoms together do not pain a clear picture, will start with labs, chest x-ray, abdominal CT scan. I do not feel she has PE since she has reproducible chest tenderness and no pleuritic component. Abdominal CT scan is normal. K is 2.6, orally replaced. I spoke with the hospitalist who recommended discharge with by mouth potassium supplements since she is not having acute vomiting. Patient is very concerned about her chest pain of blood clots. CT PET scan is normal. She was hydrated since she did receive a large contrast load. Patient is stable for discharge, can follow-up with PCP. Serial troponins negative.  Evelina Bucy, MD 06/16/14 [phone]

## 2014-06-15 NOTE — ED Notes (Signed)
Assisted patient to restroom.

## 2014-06-19 ENCOUNTER — Telehealth: Payer: Self-pay | Admitting: *Deleted

## 2014-06-19 DIAGNOSIS — G894 Chronic pain syndrome: Secondary | ICD-10-CM | POA: Diagnosis not present

## 2014-06-19 NOTE — Telephone Encounter (Signed)
-----   Message from Lorayne Marek, MD sent at 06/14/2014  9:12 AM EST ----- Blood work reviewed  potassium is borderline low, advise patient to take KCL 20 mEq daily for 10 days.

## 2014-06-19 NOTE — Telephone Encounter (Signed)
Pt aware of lab results, advice to take KCL 20 daily for 10 days

## 2014-06-20 ENCOUNTER — Ambulatory Visit: Payer: Commercial Managed Care - HMO | Attending: Internal Medicine

## 2014-06-20 ENCOUNTER — Other Ambulatory Visit: Payer: Self-pay | Admitting: Internal Medicine

## 2014-06-20 VITALS — BP 134/81 | HR 67 | Resp 16

## 2014-06-20 DIAGNOSIS — I82409 Acute embolism and thrombosis of unspecified deep veins of unspecified lower extremity: Secondary | ICD-10-CM | POA: Diagnosis present

## 2014-06-20 LAB — POCT INR: INR: 2

## 2014-06-20 MED ORDER — FUROSEMIDE 20 MG PO TABS: 20.0000 mg | ORAL_TABLET | Freq: Every day | ORAL | Status: DC

## 2014-06-20 MED ORDER — POTASSIUM CHLORIDE CRYS ER 20 MEQ PO TBCR: 20.0000 meq | EXTENDED_RELEASE_TABLET | Freq: Every day | ORAL | Status: DC

## 2014-06-20 NOTE — Progress Notes (Unsigned)
While being discharged from [facility] pt wanted refill blood pressure on HCTZ After looking at [facility], medication not listed Pt states she ran out of Lasix medication- we will refill after speaking with Dr. Annitta Needs Potassium needs replaced

## 2014-06-21 DIAGNOSIS — G894 Chronic pain syndrome: Secondary | ICD-10-CM | POA: Diagnosis not present

## 2014-06-23 DIAGNOSIS — G894 Chronic pain syndrome: Secondary | ICD-10-CM | POA: Diagnosis not present

## 2014-06-23 DIAGNOSIS — M545 Low back pain: Secondary | ICD-10-CM | POA: Diagnosis not present

## 2014-06-23 DIAGNOSIS — M5412 Radiculopathy, cervical region: Secondary | ICD-10-CM | POA: Diagnosis not present

## 2014-06-23 DIAGNOSIS — Z79899 Other long term (current) drug therapy: Secondary | ICD-10-CM | POA: Diagnosis not present

## 2014-06-23 DIAGNOSIS — M4697 Unspecified inflammatory spondylopathy, lumbosacral region: Secondary | ICD-10-CM | POA: Diagnosis not present

## 2014-06-25 ENCOUNTER — Emergency Department (HOSPITAL_COMMUNITY): Payer: Commercial Managed Care - HMO

## 2014-06-25 ENCOUNTER — Encounter (HOSPITAL_COMMUNITY): Payer: Self-pay | Admitting: Emergency Medicine

## 2014-06-25 ENCOUNTER — Emergency Department (HOSPITAL_COMMUNITY)
Admission: EM | Admit: 2014-06-25 | Discharge: 2014-06-25 | Disposition: A | Discharge status: Home / Self Care | Payer: Commercial Managed Care - HMO | Attending: Emergency Medicine | Admitting: Emergency Medicine

## 2014-06-25 DIAGNOSIS — Z8742 Personal history of other diseases of the female genital tract: Secondary | ICD-10-CM | POA: Diagnosis not present

## 2014-06-25 DIAGNOSIS — Z85038 Personal history of other malignant neoplasm of large intestine: Secondary | ICD-10-CM | POA: Diagnosis not present

## 2014-06-25 DIAGNOSIS — J449 Chronic obstructive pulmonary disease, unspecified: Secondary | ICD-10-CM | POA: Diagnosis not present

## 2014-06-25 DIAGNOSIS — Z720 Tobacco use: Secondary | ICD-10-CM | POA: Diagnosis not present

## 2014-06-25 DIAGNOSIS — G43909 Migraine, unspecified, not intractable, without status migrainosus: Secondary | ICD-10-CM | POA: Insufficient documentation

## 2014-06-25 DIAGNOSIS — M1990 Unspecified osteoarthritis, unspecified site: Secondary | ICD-10-CM | POA: Diagnosis not present

## 2014-06-25 DIAGNOSIS — F329 Major depressive disorder, single episode, unspecified: Secondary | ICD-10-CM | POA: Insufficient documentation

## 2014-06-25 DIAGNOSIS — I10 Essential (primary) hypertension: Secondary | ICD-10-CM | POA: Diagnosis not present

## 2014-06-25 DIAGNOSIS — R000 Tachycardia, unspecified: Secondary | ICD-10-CM | POA: Insufficient documentation

## 2014-06-25 DIAGNOSIS — R220 Localized swelling, mass and lump, head: Secondary | ICD-10-CM | POA: Diagnosis present

## 2014-06-25 DIAGNOSIS — K219 Gastro-esophageal reflux disease without esophagitis: Secondary | ICD-10-CM | POA: Diagnosis not present

## 2014-06-25 DIAGNOSIS — J039 Acute tonsillitis, unspecified: Secondary | ICD-10-CM | POA: Diagnosis not present

## 2014-06-25 DIAGNOSIS — Z86718 Personal history of other venous thrombosis and embolism: Secondary | ICD-10-CM | POA: Diagnosis not present

## 2014-06-25 DIAGNOSIS — R600 Localized edema: Secondary | ICD-10-CM | POA: Diagnosis not present

## 2014-06-25 DIAGNOSIS — G8929 Other chronic pain: Secondary | ICD-10-CM | POA: Diagnosis not present

## 2014-06-25 DIAGNOSIS — J36 Peritonsillar abscess: Secondary | ICD-10-CM | POA: Diagnosis not present

## 2014-06-25 DIAGNOSIS — F419 Anxiety disorder, unspecified: Secondary | ICD-10-CM | POA: Diagnosis not present

## 2014-06-25 DIAGNOSIS — R070 Pain in throat: Secondary | ICD-10-CM

## 2014-06-25 DIAGNOSIS — Z79899 Other long term (current) drug therapy: Secondary | ICD-10-CM | POA: Diagnosis not present

## 2014-06-25 DIAGNOSIS — Z7951 Long term (current) use of inhaled steroids: Secondary | ICD-10-CM | POA: Diagnosis not present

## 2014-06-25 DIAGNOSIS — R591 Generalized enlarged lymph nodes: Secondary | ICD-10-CM | POA: Diagnosis not present

## 2014-06-25 LAB — I-STAT CHEM 8, ED
BUN: 9 mg/dL (ref 6–23)
CREATININE: 1 mg/dL (ref 0.50–1.10)
Calcium, Ion: 1.14 mmol/L (ref 1.12–1.23)
Chloride: 99 mEq/L (ref 96–112)
Glucose, Bld: 106 mg/dL — ABNORMAL HIGH (ref 70–99)
HEMATOCRIT: 49 % — AB (ref 36.0–46.0)
HEMOGLOBIN: 16.7 g/dL — AB (ref 12.0–15.0)
Potassium: 3 mmol/L — ABNORMAL LOW (ref 3.5–5.1)
SODIUM: 138 mmol/L (ref 135–145)
TCO2: 25 mmol/L (ref 0–100)

## 2014-06-25 LAB — CBC WITH DIFFERENTIAL/PLATELET
Basophils Absolute: 0 10*3/uL (ref 0.0–0.1)
Basophils Relative: 0 % (ref 0–1)
Eosinophils Absolute: 0 10*3/uL (ref 0.0–0.7)
Eosinophils Relative: 0 % (ref 0–5)
HEMATOCRIT: 44 % (ref 36.0–46.0)
Hemoglobin: 14.2 g/dL (ref 12.0–15.0)
LYMPHS ABS: 1.3 10*3/uL (ref 0.7–4.0)
LYMPHS PCT: 8 % — AB (ref 12–46)
MCH: 31.8 pg (ref 26.0–34.0)
MCHC: 32.3 g/dL (ref 30.0–36.0)
MCV: 98.4 fL (ref 78.0–100.0)
MONO ABS: 1.4 10*3/uL — AB (ref 0.1–1.0)
MONOS PCT: 8 % (ref 3–12)
Neutro Abs: 14.1 10*3/uL — ABNORMAL HIGH (ref 1.7–7.7)
Neutrophils Relative %: 84 % — ABNORMAL HIGH (ref 43–77)
PLATELETS: 138 10*3/uL — AB (ref 150–400)
RBC: 4.47 MIL/uL (ref 3.87–5.11)
RDW: 14.3 % (ref 11.5–15.5)
WBC: 16.8 10*3/uL — ABNORMAL HIGH (ref 4.0–10.5)

## 2014-06-25 LAB — I-STAT CG4 LACTIC ACID, ED: LACTIC ACID, VENOUS: 2.33 mmol/L — AB (ref 0.5–2.2)

## 2014-06-25 LAB — PROTIME-INR
INR: 2.02 — ABNORMAL HIGH (ref 0.00–1.49)
PROTHROMBIN TIME: 23 s — AB (ref 11.6–15.2)

## 2014-06-25 MED ORDER — ONDANSETRON HCL 4 MG/2ML IJ SOLN
4.0000 mg | Freq: Once | INTRAMUSCULAR | Status: AC
  Administered 2014-06-25: 4 mg via INTRAVENOUS
  Filled 2014-06-25: qty 2

## 2014-06-25 MED ORDER — SODIUM CHLORIDE 0.9 % IV SOLN
3.0000 g | Freq: Once | INTRAVENOUS | Status: AC
  Administered 2014-06-25: 3 g via INTRAVENOUS
  Filled 2014-06-25: qty 3

## 2014-06-25 MED ORDER — IOHEXOL 300 MG/ML  SOLN
100.0000 mL | Freq: Once | INTRAMUSCULAR | Status: AC | PRN
  Administered 2014-06-25: 100 mL via INTRAVENOUS

## 2014-06-25 MED ORDER — PREDNISONE 20 MG PO TABS: 40.0000 mg | ORAL_TABLET | Freq: Every day | ORAL | Status: DC

## 2014-06-25 MED ORDER — MORPHINE SULFATE 4 MG/ML IJ SOLN
4.0000 mg | Freq: Once | INTRAMUSCULAR | Status: AC
  Administered 2014-06-25: 4 mg via INTRAVENOUS
  Filled 2014-06-25: qty 1

## 2014-06-25 MED ORDER — KETOROLAC TROMETHAMINE 30 MG/ML IJ SOLN
30.0000 mg | Freq: Once | INTRAMUSCULAR | Status: AC
  Administered 2014-06-25: 30 mg via INTRAVENOUS
  Filled 2014-06-25: qty 1

## 2014-06-25 MED ORDER — AMOXICILLIN-POT CLAVULANATE 875-125 MG PO TABS: 1.0000 | ORAL_TABLET | Freq: Two times a day (BID) | ORAL | Status: DC

## 2014-06-25 MED ORDER — DEXAMETHASONE SODIUM PHOSPHATE 10 MG/ML IJ SOLN
10.0000 mg | Freq: Once | INTRAMUSCULAR | Status: AC
  Administered 2014-06-25: 10 mg via INTRAVENOUS
  Filled 2014-06-25: qty 1

## 2014-06-25 MED ORDER — POTASSIUM CHLORIDE 10 MEQ/100ML IV SOLN
10.0000 meq | INTRAVENOUS | Status: AC
  Administered 2014-06-25: 10 meq via INTRAVENOUS
  Filled 2014-06-25: qty 100

## 2014-06-25 MED ORDER — BENZOCAINE 20 % MT SOLN
Freq: Once | OROMUCOSAL | Status: AC
  Administered 2014-06-25: 09:00:00 via OROMUCOSAL
  Filled 2014-06-25: qty 57

## 2014-06-25 MED ORDER — CLINDAMYCIN HCL 300 MG PO CAPS: 300.0000 mg | ORAL_CAPSULE | Freq: Three times a day (TID) | ORAL | Status: DC

## 2014-06-25 MED ORDER — LIDOCAINE-EPINEPHRINE 1 %-1:100000 IJ SOLN
INTRAMUSCULAR | Status: AC
  Administered 2014-06-25: 1 mL
  Filled 2014-06-25: qty 1

## 2014-06-25 MED ORDER — HYDROCODONE-ACETAMINOPHEN 7.5-325 MG/15ML PO SOLN: 10.0000 mL | ORAL | Status: DC | PRN

## 2014-06-25 NOTE — ED Notes (Signed)
She is sleeping soundly and is breathing normally; although she has a very fine stridor.  I allow her to continue to sleep.  Her monitor shows nsr with rare pac's.

## 2014-06-25 NOTE — ED Notes (Signed)
She is awake, alert and stridor now absent.  She capably makes multiple phone calls to family with her cell phone.  She is grateful for our care.

## 2014-06-25 NOTE — ED Provider Notes (Signed)
8:00 AM  Assumed care from Dr. Claudine Mouton.  Pt is a 59 y.o. F with history of hypertension, colon cancer, prior pulmonary embolus on Coumadin who presents the emergency department with a right tonsillar abscess confirmed by CT scan. ENT has been consult note and will come to the emergency department to perform I and D around 9 AM. She has received Decadron, Unasyn. On my evaluation, patient is sleeping comfortably, no respiratory distress, oxygen saturation 98% on 2 L nasal cannula.   9:00 AM  Dr. Wilburn Cornelia at bedside.   9:33 AM  D/w Dr. Wilburn Cornelia.  He attempted to perform needle aspiration with 5 separate passes. Very small amount of purulent material aspirated. Dr. Wilburn Cornelia feels patient likely has a phlegmon especially given how quickly symptoms started and on review of her CT scan. Unable to perform I and D given patient is on Coumadin. He recommends sending the patient home on clindamycin and with Hycet for pain. He has written these prescriptions. He recommends follow-up in his office in 2 weeks. Have discussed with patient return precautions. She verbalized understanding and is comfortable with plan. Her airway is patent and her oxygen saturation is 100% on room air when awake. No respiratory distress, no stridor, no trismus or drooling.  Pulpotio Bareas, DO 06/25/14 [phone]

## 2014-06-25 NOTE — ED Notes (Signed)
We are finished with procedure with minimal purulent material aspirated--pt. Tol. Well.

## 2014-06-25 NOTE — ED Notes (Signed)
Pt c/o throat swelling and pain since yesterday.  Pt's voice is nearly unintelligible d/t hot potato voice.  Upon visual inspection of oropharynx, unable to visualize oropharynx d/t swelling.  Pt states that she became [facility] on her way in.  No trouble breathing at this time.

## 2014-06-25 NOTE — ED Provider Notes (Signed)
CSN: [id number]     Arrival date & time 06/25/14  0346 History   First MD Initiated Contact with Patient 06/25/14 0402     Chief Complaint  Patient presents with  . Facial Swelling     (Consider location/radiation/quality/duration/timing/severity/associated sxs/prior Treatment) HPI  Brooke [name] is a 59 y.o. female with past medical history of hypertension, COPD, colon cancer, pulmonary embolism presenting today with sore throat and swelling. Patient states this began yesterday and denies any sore throat prior to that. History is difficult to obtain due to her voice change. She states that the right side of her neck is swollen and painful. She denies any fevers or coughing. She denies any chest pain or shortness of breath. She denies feeling of her throat closing, pruritus, or any recent allergic reaction. She does have shortness of breath at baseline when walking up the steps.  Currently at rest patient is denying shortness of breath. Nothing has made her symptoms better or worse. Patient has no further complaints.  10 Systems reviewed and are negative for acute change except as noted in the HPI.     Past Medical History  Diagnosis Date  . Hypertension   . Asthma   . COPD (chronic obstructive pulmonary disease)   . Anxiety   . Cancer   . Colon cancer   . Dyslipidemia   . Migraine headache   . Chronic bronchitis   . Uterine fibroid   . Ovarian cyst   . GERD (gastroesophageal reflux disease)   . Morbid obesity   . Obstructive sleep apnea     mild  . Chronic pain   . Chronic back pain   . Arthritis   . Osteoarthritis   . Depression   . DVT (deep vein thrombosis) in pregnancy    Past Surgical History  Procedure Laterality Date  . Knee arthroscopy      bil.  . Carpal tunnel release      rt  . Mouth surgery      teeth extraction  . Cesarean section    . Subtotal colectomy    . Colonoscopy    . Tubal ligation     Family History  Problem Relation Age of Onset  .  Uterine cancer Mother   . Stroke Father   . Colon cancer Maternal Uncle   . Diabetes Father   . Ovarian cancer Mother   . Hypertension Father   . Breast cancer Maternal Grandmother   . Diabetes Sister   . Asthma Child   . Asthma Child    History  Substance Use Topics  . Smoking status: Current Some Day Smoker -- 1.00 packs/day for 37 years    Types: Cigarettes  . Smokeless tobacco: Never Used     Comment: trying to quit, down to .5ppd X2 years ago.   . Alcohol Use: No   OB History    Gravida Para Term Preterm AB TAB SAB Ectopic Multiple Living   2 2 2       2      Review of Systems    Allergies  Kiwi extract and Aspirin  Home Medications   Prior to Admission medications   Medication Sig Start Date End Date Taking? Authorizing Provider  acetaminophen (TYLENOL) 500 MG tablet Take 500 mg by mouth every 6 (six) hours as needed for headache.    Historical Provider, MD  albuterol (PROVENTIL HFA;VENTOLIN HFA) 108 (90 BASE) MCG/ACT inhaler Inhale 2 puffs into the lungs every 6 (six) hours as  needed for wheezing or shortness of breath (shortness of breath). 02/20/14   Oswald Hillock, MD  cyclobenzaprine (FLEXERIL) 10 MG tablet Take 1 tablet (10 mg total) by mouth 2 (two) times daily as needed for muscle spasms. 06/15/14   Evelina Bucy, MD  fluticasone (FLOVENT HFA) 110 MCG/ACT inhaler Inhale 2 puffs into the lungs every 12 (twelve) hours. 01/17/14   Lorayne Marek, MD  furosemide (LASIX) 20 MG tablet Take 1 tablet (20 mg total) by mouth daily. 06/20/14   Lorayne Marek, MD  LORazepam (ATIVAN) 1 MG tablet Take 1 tablet (1 mg total) by mouth every 6 (six) hours as needed for anxiety (anxiety). 06/13/14   Lorayne Marek, MD  omeprazole (PRILOSEC) 40 MG capsule Take 1 capsule (40 mg total) by mouth daily. 06/13/14   Lorayne Marek, MD  oxyCODONE-acetaminophen (PERCOCET/ROXICET) 5-325 MG per tablet Take 1-2 tablets by mouth every 6 (six) hours as needed for severe pain. Patient not taking: Reported on  06/15/2014 03/28/14   Merryl Hacker, MD  potassium chloride SA (K-DUR,KLOR-CON) 20 MEQ tablet Take 1 tablet (20 mEq total) by mouth daily. 06/20/14   Lorayne Marek, MD  sertraline (ZOLOFT) 100 MG tablet Take 1 tablet (100 mg total) by mouth daily. Patient not taking: Reported on 06/15/2014 06/13/14   Lorayne Marek, MD  traZODone (DESYREL) 50 MG tablet Take 1 tablet (50 mg total) by mouth at bedtime. 01/17/14   Lorayne Marek, MD  warfarin (COUMADIN) 5 MG tablet Take 1 tablet (5 mg total) by mouth daily. 06/13/14   Lorayne Marek, MD   BP 130/77 mmHg  Pulse 125  Temp(Src) 99.9 F (37.7 C) (Oral)  Resp 18  SpO2 98%  LMP 05/24/2003 Physical Exam  Constitutional: She is oriented to person, place, and time. She appears well-developed and well-nourished. She appears distressed.  HENT:  Head: Normocephalic and atraumatic.  Right tonsil is large, swollen, with exudates and erythema. Uvula is not swollen. Airway is patent. Is also tenderness and swelling around the right submandibular area.  Eyes: Conjunctivae and EOM are normal. Pupils are equal, round, and reactive to light. No scleral icterus.  Neck: Normal range of motion. Neck supple. No JVD present. No tracheal deviation present. No thyromegaly present.  Cardiovascular: Regular rhythm and normal heart sounds.  Exam reveals no gallop and no friction rub.   No murmur heard. Tachycardic  Pulmonary/Chest: Effort normal and breath sounds normal. No respiratory distress. She has no wheezes. She exhibits no tenderness.  Abdominal: Soft. Bowel sounds are normal. She exhibits no distension and no mass. There is no tenderness. There is no rebound and no guarding.  Musculoskeletal: Normal range of motion. She exhibits no edema or tenderness.  Lymphadenopathy:    She has no cervical adenopathy.  Neurological: She is alert and oriented to person, place, and time. No cranial nerve deficit. She exhibits normal muscle tone.  Skin: Skin is warm and dry. No rash  noted. No erythema. No pallor.  Nursing note and vitals reviewed.   ED Course  Procedures (including critical care time) Labs Review Labs Reviewed  CBC WITH DIFFERENTIAL - Abnormal; Notable for the following:    WBC 16.8 (*)    Platelets 138 (*)    Neutrophils Relative % 84 (*)    Neutro Abs 14.1 (*)    Lymphocytes Relative 8 (*)    Monocytes Absolute 1.4 (*)    All other components within normal limits  PROTIME-INR - Abnormal; Notable for the following:    Prothrombin Time  23.0 (*)    INR 2.02 (*)    All other components within normal limits  I-STAT CHEM 8, ED - Abnormal; Notable for the following:    Potassium 3.0 (*)    Glucose, Bld 106 (*)    Hemoglobin 16.7 (*)    HCT 49.0 (*)    All other components within normal limits  I-STAT CG4 LACTIC ACID, ED - Abnormal; Notable for the following:    Lactic Acid, Venous 2.33 (*)    All other components within normal limits    Imaging Review Ct Soft Tissue Neck W Contrast  06/25/2014   CLINICAL DATA:  Throat swelling and pain beginning yesterday. Difficulty speaking. Swelling. Shortness of breath.  EXAM: CT NECK WITH CONTRAST  TECHNIQUE: Multidetector CT imaging of the neck was performed using the standard protocol following the bolus administration of intravenous contrast.  CONTRAST:  115mL OMNIPAQUE IOHEXOL 300 MG/ML  SOLN  COMPARISON:  MRI of the cervical spine July 21, 2013  FINDINGS: Pharynx and larynx: Enlarged bilateral palatine tonsils, with striated enhancement. Superimposed 11 x 17 mm rim enhancing RIGHT peritonsillar abscess extending laterally. Effusion within the RIGHT submandibular space. Hypo pharyngeal edema, partially effacing the airway. However, epiglottis is not enlarged. Larynx is unremarkable.  Salivary glands: RIGHT submandibular space effusion, no CT findings of sialoadenitis. Thyroid gland appears mildly enlarged, no dominant nodule.  Lymph nodes: Lymphadenopathy, 14 mm short axis RIGHT level IIa lymph node. 9  mm LEFT level access to a lymph node.  Vascular: Mild calcific atherosclerosis of the LEFT carotid bulb. RIGHT internal jugular vein is dominant, and patent.  Limited intracranial: Normal. Poor dentition with multiple dental caries in periapical lucencies.  Mastoids and visualized paranasal sinuses: Well aerated.  Skeleton: Mild degenerative changes cervical spine without destructive bony lesions.  Upper chest: Normal.  IMPRESSION: Acute tonsillitis partially effacing the airway. Superimposed RIGHT peritonsillar 11 x 17 mm abscess. Edema and effusion, sparing of the epiglottis.  Cervical lymphadenopathy is likely reactive.   Electronically Signed   By: Elon Alas   On: 06/25/2014 05:10     EKG Interpretation None      MDM   Final diagnoses:  Throat pain    Patient since emergency department for throat pain and swelling. A physical exam reveals a likely peritonsillar abscess. Patient was given IV fluids, Decadron, Unasyn for treatment. Will evaluate with CT scan of the neck.  CT scan reveals acute tonsillitis with a right peritonsillar abscess. There is some partial effacement of the airway.  Patient has received Toradol and morphine for pain relief. Potassium has been replaced. I spoke with Dr. Claiborne Rigg with ENT who will perform I and D of abscess in the emergency department after rounds at 9 AM. I have prepared the patient's discharge instructions for the oncoming provider.    Everlene Balls, MD 06/25/14 [phone]

## 2014-06-25 NOTE — Discharge Instructions (Signed)
Peritonsillar Abscess Brooke [name], your peritonsillar abscess was drained by ENT. Continue to take antibiotics and steroids as prescribed and follow-up with the ENT physician within 2 weeks for continued management. If symptoms worsen come back to the emergency department immediately. Thank you.  Peritonsillar abscess is a collection of yellowish white fluid (pus) in the back of the throat. This fluid forms behind the tonsils. The treatment is most often drainage. This is done by:  Putting a needle into the abscess.  Cutting and draining the abscess. HOME CARE  If your abscess was drained today:  Mix 1 teaspoon of salt in 8 ounces of warm water for gargling.  Gargle the warm salt water.  Gargle 4 times per day or as needed for comfort. Do not swallow this mixture.  Rest in bed as needed.  Return to normal activity as soon as you can.  Apply cold to your neck for pain relief.  Put ice in a plastic bag.  Place a towel between your skin and the bag.  Leave the ice on for 15-20 minutes at a time, 03-04 times a day.  Eat a soft or liquid diet. Drink cold fluids. Cold fluids will soothe and take puffiness (swelling) down.  Only take medicine as told by your doctor.  Take all medicine as told. GET HELP RIGHT AWAY IF:   You are coughing up or throwing up (vomiting) blood.  Your throat pain is severe and pain medicine does not help it.  You have trouble talking or breathing.  You have trouble swallowing or eating.  You find it easier to breathe while leaning forward.  You have pain that gets worse.  You have pain, puffiness, redness, or drainage in your throat.  You have a fever.  You become dizzy, have a headache, have low energy, or feel sick.  You have signs of body fluid loss (dehydration). This includes lightheadedness when standing, peeing (urinating) less, a fast heart rate, or dry mouth and nose.  You start to drool. MAKE SURE YOU:  Understand these  instructions.  Will watch your condition.  Will get help if you are not doing well or get worse. Document Released: 05/14/2009 Document Revised: 08/18/2011 Document Reviewed: 05/14/2009 [facility] Patient Information 2015 [place], [place]. This information is not intended to replace advice given to you by your health care provider. Make sure you discuss any questions you have with your health care provider.

## 2014-06-25 NOTE — ED Notes (Signed)
Dr. Wilburn Cornelia is here and pt. Gives verbal permission for him to aspirate her peritonsillar abscess.  She is quite cooperative.

## 2014-06-25 NOTE — Consult Note (Signed)
ENT CONSULT:  Reason for Consult: Acute tonsillitis Referring Physician: EDP  Brooke [name] is an 59 y.o. female.  HPI: The patient presents with a 18 hour history of progressive severe sore throat. No significant prior throat history, tonsillitis or infections. She reports bilateral sore throat, worse on the right with right otalgia, tolerating her oral secretions and liquids.  Past Medical History  Diagnosis Date  . Hypertension   . Asthma   . COPD (chronic obstructive pulmonary disease)   . Anxiety   . Cancer   . Colon cancer   . Dyslipidemia   . Migraine headache   . Chronic bronchitis   . Uterine fibroid   . Ovarian cyst   . GERD (gastroesophageal reflux disease)   . Morbid obesity   . Obstructive sleep apnea     mild  . Chronic pain   . Chronic back pain   . Arthritis   . Osteoarthritis   . Depression   . DVT (deep vein thrombosis) in pregnancy     Past Surgical History  Procedure Laterality Date  . Knee arthroscopy      bil.  . Carpal tunnel release      rt  . Mouth surgery      teeth extraction  . Cesarean section    . Subtotal colectomy    . Colonoscopy    . Tubal ligation      Family History  Problem Relation Age of Onset  . Uterine cancer Mother   . Stroke Father   . Colon cancer Maternal Uncle   . Diabetes Father   . Ovarian cancer Mother   . Hypertension Father   . Breast cancer Maternal Grandmother   . Diabetes Sister   . Asthma Child   . Asthma Child     Social History:  reports that she has been smoking Cigarettes.  She has a 37 pack-year smoking history. She has never used smokeless tobacco. She reports that she does not drink alcohol or use illicit drugs.  Allergies:  Allergies  Allergen Reactions  . Kiwi Extract Anaphylaxis, Hives and Swelling    Makes her swell all over   . Aspirin Nausea Only    Can take as long as enteric coated    Medications: I have reviewed the patient's current medications.  Results for orders placed  or performed during the hospital encounter of 06/25/14 (from the past 48 hour(s))  CBC with Differential     Status: Abnormal   Collection Time: 06/25/14  4:10 AM  Result Value Ref Range   WBC 16.8 (H) 4.0 - 10.5 K/uL   RBC 4.47 3.87 - 5.11 MIL/uL   Hemoglobin 14.2 12.0 - 15.0 g/dL   HCT 44.0 36.0 - 46.0 %   MCV 98.4 78.0 - 100.0 fL   MCH 31.8 26.0 - 34.0 pg   MCHC 32.3 30.0 - 36.0 g/dL   RDW 14.3 11.5 - 15.5 %   Platelets 138 (L) 150 - 400 K/uL   Neutrophils Relative % 84 (H) 43 - 77 %   Neutro Abs 14.1 (H) 1.7 - 7.7 K/uL   Lymphocytes Relative 8 (L) 12 - 46 %   Lymphs Abs 1.3 0.7 - 4.0 K/uL   Monocytes Relative 8 3 - 12 %   Monocytes Absolute 1.4 (H) 0.1 - 1.0 K/uL   Eosinophils Relative 0 0 - 5 %   Eosinophils Absolute 0.0 0.0 - 0.7 K/uL   Basophils Relative 0 0 - 1 %   Basophils  Absolute 0.0 0.0 - 0.1 K/uL  I-stat chem 8, ed     Status: Abnormal   Collection Time: 06/25/14  4:24 AM  Result Value Ref Range   Sodium 138 135 - 145 mmol/L   Potassium 3.0 (L) 3.5 - 5.1 mmol/L   Chloride 99 96 - 112 mEq/L   BUN 9 6 - 23 mg/dL   Creatinine, Ser 1.00 0.50 - 1.10 mg/dL   Glucose, Bld 106 (H) 70 - 99 mg/dL   Calcium, Ion 1.14 1.12 - 1.23 mmol/L   TCO2 25 0 - 100 mmol/L   Hemoglobin 16.7 (H) 12.0 - 15.0 g/dL   HCT 49.0 (H) 36.0 - 46.0 %  I-Stat CG4 Lactic Acid, ED     Status: Abnormal   Collection Time: 06/25/14  4:25 AM  Result Value Ref Range   Lactic Acid, Venous 2.33 (H) 0.5 - 2.2 mmol/L  Protime-INR     Status: Abnormal   Collection Time: 06/25/14  5:25 AM  Result Value Ref Range   Prothrombin Time 23.0 (H) 11.6 - 15.2 seconds   INR 2.02 (H) 0.00 - 1.49    Ct Soft Tissue Neck W Contrast  06/25/2014   CLINICAL DATA:  Throat swelling and pain beginning yesterday. Difficulty speaking. Swelling. Shortness of breath.  EXAM: CT NECK WITH CONTRAST  TECHNIQUE: Multidetector CT imaging of the neck was performed using the standard protocol following the bolus administration of  intravenous contrast.  CONTRAST:  183mL OMNIPAQUE IOHEXOL 300 MG/ML  SOLN  COMPARISON:  MRI of the cervical spine July 21, 2013  FINDINGS: Pharynx and larynx: Enlarged bilateral palatine tonsils, with striated enhancement. Superimposed 11 x 17 mm rim enhancing RIGHT peritonsillar abscess extending laterally. Effusion within the RIGHT submandibular space. Hypo pharyngeal edema, partially effacing the airway. However, epiglottis is not enlarged. Larynx is unremarkable.  Salivary glands: RIGHT submandibular space effusion, no CT findings of sialoadenitis. Thyroid gland appears mildly enlarged, no dominant nodule.  Lymph nodes: Lymphadenopathy, 14 mm short axis RIGHT level IIa lymph node. 9 mm LEFT level access to a lymph node.  Vascular: Mild calcific atherosclerosis of the LEFT carotid bulb. RIGHT internal jugular vein is dominant, and patent.  Limited intracranial: Normal. Poor dentition with multiple dental caries in periapical lucencies.  Mastoids and visualized paranasal sinuses: Well aerated.  Skeleton: Mild degenerative changes cervical spine without destructive bony lesions.  Upper chest: Normal.  IMPRESSION: Acute tonsillitis partially effacing the airway. Superimposed RIGHT peritonsillar 11 x 17 mm abscess. Edema and effusion, sparing of the epiglottis.  Cervical lymphadenopathy is likely reactive.   Electronically Signed   By: Elon Alas   On: 06/25/2014 05:10    Review of Systems  Constitutional: Positive for fever and chills.  HENT: Positive for sore throat.   Respiratory: Negative.   Cardiovascular: Negative.   Skin: Negative.    Blood pressure 123/74, pulse 105, temperature 99.9 F (37.7 C), temperature source Oral, resp. rate 18, last menstrual period 05/24/2003, SpO2 100 %. Physical Exam  Constitutional: She appears well-developed.  HENT:  3+ cryptic tonsils with exudate Rt PT swelling, no trismus  Neck: Normal range of motion. Neck supple.  Cardiovascular: Normal rate.    Respiratory: Effort normal.   Procedure needle aspiration right peritonsillar fossa: The patient has a history of thrombophlebitis and pulmonary embolism and is currently on high-dose Coumadin. The risks and benefits of needle aspiration of peritonsillar abscess were discussed in detail the patient understood. I opted to perform needle aspiration versus open drainage because  of the patient's anticoagulant therapy.  The patient's oropharynx was anesthetized with topical spray followed by 1/2 mL of 1% lidocaine 1 100,000 dilution epinephrine injected into the right peritonsillar fossa. Multiple passes with a 20-gauge spinal needle under direct visualization were performed in the right peritonsillar fossa, no pus or discharge. Moderate discomfort, no bleeding. Patient tolerated procedure.   Assessment/Plan: The patient presents to the emergency department with acute symptoms of sore throat, CT findings show hypodense. Bilaterally but worse on the right consistent with acute tonsillitis and possible tonsillar phlegmon, no organized abscess. This is confirmed on fine-needle aspiration under local anesthetic, no pus or discharge. Recommend tonsil precautions including increase oral fluids, warm salt water gargle, warm compress to the right neck and face and elevated head of bed. The patient is prescribed clindamycin 300 mg by mouth 3 times a day for 10 days and Hycet elixir for pain. Expect gradual improvement in symptoms over the next 3-5 days. If patient worsens may require reevaluation in the emergency department. Plan outpatient follow-up in 2 weeks for recheck.  [place], Derriona Branscom 06/25/2014, 9:18 AM

## 2014-06-27 ENCOUNTER — Other Ambulatory Visit (HOSPITAL_COMMUNITY): Payer: Self-pay | Admitting: Pain Medicine

## 2014-06-27 ENCOUNTER — Encounter: Payer: Self-pay | Admitting: Internal Medicine

## 2014-06-27 ENCOUNTER — Ambulatory Visit (HOSPITAL_COMMUNITY)
Admission: RE | Admit: 2014-06-27 | Discharge: 2014-06-27 | Disposition: A | Discharge status: Home / Self Care | Payer: Commercial Managed Care - HMO | Source: Ambulatory Visit | Attending: Pain Medicine | Admitting: Pain Medicine

## 2014-06-27 ENCOUNTER — Ambulatory Visit (HOSPITAL_BASED_OUTPATIENT_CLINIC_OR_DEPARTMENT_OTHER): Payer: Commercial Managed Care - HMO | Admitting: Internal Medicine

## 2014-06-27 VITALS — BP 132/68 | HR 101 | Temp 97.9°F | Resp 16 | Wt 258.0 lb

## 2014-06-27 DIAGNOSIS — R52 Pain, unspecified: Secondary | ICD-10-CM

## 2014-06-27 DIAGNOSIS — M179 Osteoarthritis of knee, unspecified: Secondary | ICD-10-CM | POA: Insufficient documentation

## 2014-06-27 DIAGNOSIS — Z09 Encounter for follow-up examination after completed treatment for conditions other than malignant neoplasm: Secondary | ICD-10-CM

## 2014-06-27 DIAGNOSIS — J039 Acute tonsillitis, unspecified: Secondary | ICD-10-CM

## 2014-06-27 DIAGNOSIS — M79662 Pain in left lower leg: Secondary | ICD-10-CM | POA: Diagnosis present

## 2014-06-27 DIAGNOSIS — M25562 Pain in left knee: Secondary | ICD-10-CM | POA: Diagnosis not present

## 2014-06-27 NOTE — Progress Notes (Signed)
MRN: [id number] [name]  Sex: female Age: 59 y.o. DOB: Sep 28, 1955  Allergies: Kiwi extract and Aspirin  Chief Complaint  Patient presents with  . Follow-up    HPI: Patient is 59 y.o. female who has history of hypertension, COPD, pulmonary embolism on Coumadin, recently went to the emergency room, EMR reviewed she was diagnosed with a right acute tonsillitis with peritonsillar abscess, ENT was consulted patient was discharged on antibiotic clindamycin and pain medication, was advised to follow in 2 weeks with ENT. As per patient currently she's not taking the second dose of antibiotic and has not taken the pain medication yet has some pain but denies any fever chills. Patient will schedule follow up with her ENT  Past Medical History  Diagnosis Date  . Hypertension   . Asthma   . COPD (chronic obstructive pulmonary disease)   . Anxiety   . Cancer   . Colon cancer   . Dyslipidemia   . Migraine headache   . Chronic bronchitis   . Uterine fibroid   . Ovarian cyst   . GERD (gastroesophageal reflux disease)   . Morbid obesity   . Obstructive sleep apnea     mild  . Chronic pain   . Chronic back pain   . Arthritis   . Osteoarthritis   . Depression   . DVT (deep vein thrombosis) in pregnancy     Past Surgical History  Procedure Laterality Date  . Knee arthroscopy      bil.  . Carpal tunnel release      rt  . Mouth surgery      teeth extraction  . Cesarean section    . Subtotal colectomy    . Colonoscopy    . Tubal ligation        Medication List       This list is accurate as of: 06/27/14  5:23 PM.  Always use your most recent med list.               acetaminophen 500 MG tablet  Commonly known as:  TYLENOL  Take 500 mg by mouth every 6 (six) hours as needed for headache.     albuterol 108 (90 BASE) MCG/ACT inhaler  Commonly known as:  PROVENTIL HFA;VENTOLIN HFA  Inhale 2 puffs into the lungs every 6 (six) hours as needed for wheezing or  shortness of breath (shortness of breath).     clindamycin 300 MG capsule  Commonly known as:  CLEOCIN  Take 1 capsule (300 mg total) by mouth 3 (three) times daily. May open cap and take with liquid     cyclobenzaprine 10 MG tablet  Commonly known as:  FLEXERIL  Take 1 tablet (10 mg total) by mouth 2 (two) times daily as needed for muscle spasms.     fluticasone 110 MCG/ACT inhaler  Commonly known as:  FLOVENT HFA  Inhale 2 puffs into the lungs every 12 (twelve) hours.     furosemide 20 MG tablet  Commonly known as:  LASIX  Take 1 tablet (20 mg total) by mouth daily.     HYDROcodone-acetaminophen 7.5-325 mg/15 ml solution  Commonly known as:  HYCET  Take 10-15 mLs by mouth every 4 (four) hours as needed for moderate pain.     LORazepam 1 MG tablet  Commonly known as:  ATIVAN  Take 1 tablet (1 mg total) by mouth every 6 (six) hours as needed for anxiety (anxiety).     omeprazole 40 MG  capsule  Commonly known as:  PRILOSEC  Take 1 capsule (40 mg total) by mouth daily.     oxyCODONE-acetaminophen 5-325 MG per tablet  Commonly known as:  PERCOCET/ROXICET  Take 1-2 tablets by mouth every 6 (six) hours as needed for severe pain.     potassium chloride SA 20 MEQ tablet  Commonly known as:  K-DUR,KLOR-CON  Take 1 tablet (20 mEq total) by mouth daily.     sertraline 100 MG tablet  Commonly known as:  ZOLOFT  Take 1 tablet (100 mg total) by mouth daily.     traZODone 50 MG tablet  Commonly known as:  DESYREL  Take 1 tablet (50 mg total) by mouth at bedtime.     warfarin 5 MG tablet  Commonly known as:  COUMADIN  Take 1 tablet (5 mg total) by mouth daily.        No orders of the defined types were placed in this encounter.    Immunization History  Administered Date(s) Administered  . Influenza Split 08/19/2012  . Influenza,inj,Quad PF,36+ Mos 04/20/2013, 02/08/2014  . Pneumococcal Polysaccharide-23 10/24/2011, 08/19/2012    Family History  Problem Relation Age of  Onset  . Uterine cancer Mother   . Stroke Father   . Colon cancer Maternal Uncle   . Diabetes Father   . Ovarian cancer Mother   . Hypertension Father   . Breast cancer Maternal Grandmother   . Diabetes Sister   . Asthma Child   . Asthma Child     History  Substance Use Topics  . Smoking status: Current Some Day Smoker -- 1.00 packs/day for 37 years    Types: Cigarettes  . Smokeless tobacco: Never Used     Comment: trying to quit, down to .5ppd X2 years ago.   . Alcohol Use: No    Review of Systems   As noted in HPI  Filed Vitals:   06/27/14 1654  BP: 132/68  Pulse: 101  Temp: 97.9 F (36.6 C)  Resp: 16    Physical Exam  Physical Exam  HENT:  Right-sided Enlarged tonsils with surrounding exudate  Eyes: EOM are normal. Pupils are equal, round, and reactive to light.  Cardiovascular: Normal rate and regular rhythm.   Pulmonary/Chest: Breath sounds normal. No stridor. No respiratory distress. She has no wheezes. She has no rales.  Musculoskeletal: She exhibits no edema.    CBC    Component Value Date/Time   WBC 16.8* 06/25/2014 0410   RBC 4.47 06/25/2014 0410   HGB 16.7* 06/25/2014 0424   HCT 49.0* 06/25/2014 0424   PLT 138* 06/25/2014 0410   MCV 98.4 06/25/2014 0410   LYMPHSABS 1.3 06/25/2014 0410   MONOABS 1.4* 06/25/2014 0410   EOSABS 0.0 06/25/2014 0410   BASOSABS 0.0 06/25/2014 0410    CMP     Component Value Date/Time   NA 138 06/25/2014 0424   K 3.0* 06/25/2014 0424   CL 99 06/25/2014 0424   CO2 31 06/15/2014 0950   GLUCOSE 106* 06/25/2014 0424   BUN 9 06/25/2014 0424   CREATININE 1.00 06/25/2014 0424   CREATININE 0.72 06/13/2014 1254   CALCIUM 9.5 06/15/2014 0950   PROT 8.5* 06/15/2014 0950   ALBUMIN 4.2 06/15/2014 0950   AST 22 06/15/2014 0950   ALT 12 06/15/2014 0950   ALKPHOS 78 06/15/2014 0950   BILITOT 0.4 06/15/2014 0950   GFRNONAA 76* 06/15/2014 0950   GFRNONAA >89 06/13/2014 1254   GFRAA 88* 06/15/2014 0950   GFRAA >  89  06/13/2014 1254    Lab Results  Component Value Date/Time   CHOL 161 10/22/2011 05:53 AM    No components found for: HGA1C  Lab Results  Component Value Date/Time   AST 22 06/15/2014 09:50 AM    Assessment and Plan  Acute tonsillitis Patient is afebrile, vitals are stable, I have counseled patient to take the antibiotic as prescribed ,and plan medication when necessary she has missed the dose today,I have advised patient if she has any worsening of the symptoms or have trouble with breathing, she should get medical attention and should go to the emergency room. Patient understand and verbalized the instructions.  She'll call and schedule appointment with ENT.   Return in about 3 months (around 09/26/2014), or if symptoms worsen or fail to improve.  Lorayne Marek, MD

## 2014-06-27 NOTE — Progress Notes (Signed)
Patient here for follow up from the ed Was diagnosed with tonsilitis States had an abcess in her throat that the ed drained

## 2014-07-03 ENCOUNTER — Telehealth: Payer: Self-pay | Admitting: Internal Medicine

## 2014-07-18 ENCOUNTER — Encounter (HOSPITAL_COMMUNITY): Payer: Self-pay | Admitting: Emergency Medicine

## 2014-07-18 ENCOUNTER — Emergency Department (HOSPITAL_COMMUNITY)
Admission: EM | Admit: 2014-07-18 | Discharge: 2014-07-18 | Disposition: A | Discharge status: Home / Self Care | Payer: Commercial Managed Care - HMO | Attending: Emergency Medicine | Admitting: Emergency Medicine

## 2014-07-18 DIAGNOSIS — Z79899 Other long term (current) drug therapy: Secondary | ICD-10-CM | POA: Insufficient documentation

## 2014-07-18 DIAGNOSIS — Z7901 Long term (current) use of anticoagulants: Secondary | ICD-10-CM | POA: Insufficient documentation

## 2014-07-18 DIAGNOSIS — M1990 Unspecified osteoarthritis, unspecified site: Secondary | ICD-10-CM | POA: Insufficient documentation

## 2014-07-18 DIAGNOSIS — G8929 Other chronic pain: Secondary | ICD-10-CM | POA: Diagnosis not present

## 2014-07-18 DIAGNOSIS — Z7951 Long term (current) use of inhaled steroids: Secondary | ICD-10-CM | POA: Diagnosis not present

## 2014-07-18 DIAGNOSIS — Z85038 Personal history of other malignant neoplasm of large intestine: Secondary | ICD-10-CM | POA: Diagnosis not present

## 2014-07-18 DIAGNOSIS — K219 Gastro-esophageal reflux disease without esophagitis: Secondary | ICD-10-CM | POA: Insufficient documentation

## 2014-07-18 DIAGNOSIS — G43909 Migraine, unspecified, not intractable, without status migrainosus: Secondary | ICD-10-CM | POA: Diagnosis not present

## 2014-07-18 DIAGNOSIS — F329 Major depressive disorder, single episode, unspecified: Secondary | ICD-10-CM | POA: Insufficient documentation

## 2014-07-18 DIAGNOSIS — Z86718 Personal history of other venous thrombosis and embolism: Secondary | ICD-10-CM | POA: Diagnosis not present

## 2014-07-18 DIAGNOSIS — Z792 Long term (current) use of antibiotics: Secondary | ICD-10-CM | POA: Diagnosis not present

## 2014-07-18 DIAGNOSIS — R21 Rash and other nonspecific skin eruption: Secondary | ICD-10-CM | POA: Diagnosis present

## 2014-07-18 DIAGNOSIS — Z720 Tobacco use: Secondary | ICD-10-CM | POA: Diagnosis not present

## 2014-07-18 DIAGNOSIS — F419 Anxiety disorder, unspecified: Secondary | ICD-10-CM | POA: Insufficient documentation

## 2014-07-18 DIAGNOSIS — Z8742 Personal history of other diseases of the female genital tract: Secondary | ICD-10-CM | POA: Diagnosis not present

## 2014-07-18 DIAGNOSIS — J449 Chronic obstructive pulmonary disease, unspecified: Secondary | ICD-10-CM | POA: Diagnosis not present

## 2014-07-18 DIAGNOSIS — I10 Essential (primary) hypertension: Secondary | ICD-10-CM | POA: Diagnosis not present

## 2014-07-18 DIAGNOSIS — B029 Zoster without complications: Secondary | ICD-10-CM

## 2014-07-18 MED ORDER — ACYCLOVIR 400 MG PO TABS: 800.0000 mg | ORAL_TABLET | Freq: Every day | ORAL | Status: DC

## 2014-07-18 MED ORDER — HYDROCODONE-ACETAMINOPHEN 5-325 MG PO TABS
1.0000 | ORAL_TABLET | Freq: Once | ORAL | Status: AC
  Administered 2014-07-18: 1 via ORAL
  Filled 2014-07-18: qty 1

## 2014-07-18 MED ORDER — HYDROCODONE-ACETAMINOPHEN 5-325 MG PO TABS: 1.0000 | ORAL_TABLET | ORAL | Status: DC | PRN

## 2014-07-18 NOTE — Discharge Instructions (Signed)
Please follow the directions provided. Be sure to follow-up with your primary care doctor to ensure you're getting better. Take the & a viral medicine 800 mg by mouth 5 times a day for 5 days. He may use the a medicine as needed for pain. Don't hesitate to return for any new, worsening, or concerning symptoms.  SEEK IMMEDIATE MEDICAL CARE IF:  You have facial pain, pain around the eye area, or loss of feeling on one side of your face.  You have ear pain or ringing in your ear.  You have loss of taste.  Your pain is not relieved with prescribed medicines.  Your redness or swelling spreads.  You have more pain and swelling.  Your condition is worsening or has changed.  You have a fever.

## 2014-07-18 NOTE — ED Provider Notes (Signed)
CSN: [id number]     Arrival date & time 07/18/14  1919 History  This chart was scribed for non-physician practitioner, Britt Bottom, NP, working with Ephraim Hamburger, MD, by Jeanell Sparrow, ED Scribe. This patient was seen in room WTR6/WTR6 and the patient's care was started at 9:10 PM.   Chief Complaint  Patient presents with  . Rash   The history is provided by the patient. No language interpreter was used.   HPI Comments: Brooke [name] is a 59 y.o. female who presents to the Emergency Department complaining of a  rash that started 2 days ago. She reports that she noticed some pain on her upper back and neck. She states that when she examined the area, she noticed painful bumps. She denies any itchy sensation. She describes the pain as sharp and burning and rates as 10/10.  She denies any fevers, chills, nausea, vomiting or headache.  Past Medical History  Diagnosis Date  . Hypertension   . Asthma   . COPD (chronic obstructive pulmonary disease)   . Anxiety   . Cancer   . Colon cancer   . Dyslipidemia   . Migraine headache   . Chronic bronchitis   . Uterine fibroid   . Ovarian cyst   . GERD (gastroesophageal reflux disease)   . Morbid obesity   . Obstructive sleep apnea     mild  . Chronic pain   . Chronic back pain   . Arthritis   . Osteoarthritis   . Depression   . DVT (deep vein thrombosis) in pregnancy    Past Surgical History  Procedure Laterality Date  . Knee arthroscopy      bil.  . Carpal tunnel release      rt  . Mouth surgery      teeth extraction  . Cesarean section    . Subtotal colectomy    . Colonoscopy    . Tubal ligation     Family History  Problem Relation Age of Onset  . Uterine cancer Mother   . Stroke Father   . Colon cancer Maternal Uncle   . Diabetes Father   . Ovarian cancer Mother   . Hypertension Father   . Breast cancer Maternal Grandmother   . Diabetes Sister   . Asthma Child   . Asthma Child    History  Substance Use  Topics  . Smoking status: Current Some Day Smoker -- 1.00 packs/day for 37 years    Types: Cigarettes  . Smokeless tobacco: Never Used     Comment: trying to quit, down to .5ppd X2 years ago.   . Alcohol Use: No   OB History    Gravida Para Term Preterm AB TAB SAB Ectopic Multiple Living   2 2 2       2      Review of Systems  Constitutional: Negative for fever.  Cardiovascular: Negative for chest pain.  Skin: Positive for rash.   Allergies  Kiwi extract and Aspirin  Home Medications   Prior to Admission medications   Medication Sig Start Date End Date Taking? Authorizing Provider  acetaminophen (TYLENOL) 500 MG tablet Take 500 mg by mouth every 6 (six) hours as needed for headache.    Historical Provider, MD  albuterol (PROVENTIL HFA;VENTOLIN HFA) 108 (90 BASE) MCG/ACT inhaler Inhale 2 puffs into the lungs every 6 (six) hours as needed for wheezing or shortness of breath (shortness of breath). 02/20/14   Oswald Hillock, MD  clindamycin (CLEOCIN) 300  MG capsule Take 1 capsule (300 mg total) by mouth 3 (three) times daily. May open cap and take with liquid 06/25/14   Jerrell Belfast, MD  cyclobenzaprine (FLEXERIL) 10 MG tablet Take 1 tablet (10 mg total) by mouth 2 (two) times daily as needed for muscle spasms. 06/15/14   Evelina Bucy, MD  fluticasone (FLOVENT HFA) 110 MCG/ACT inhaler Inhale 2 puffs into the lungs every 12 (twelve) hours. 01/17/14   Lorayne Marek, MD  furosemide (LASIX) 20 MG tablet Take 1 tablet (20 mg total) by mouth daily. 06/20/14   Lorayne Marek, MD  HYDROcodone-acetaminophen (HYCET) 7.5-325 mg/15 ml solution Take 10-15 mLs by mouth every 4 (four) hours as needed for moderate pain. 06/25/14   Jerrell Belfast, MD  LORazepam (ATIVAN) 1 MG tablet Take 1 tablet (1 mg total) by mouth every 6 (six) hours as needed for anxiety (anxiety). 06/13/14   Lorayne Marek, MD  omeprazole (PRILOSEC) 40 MG capsule Take 1 capsule (40 mg total) by mouth daily. 06/13/14   Lorayne Marek, MD   oxyCODONE-acetaminophen (PERCOCET/ROXICET) 5-325 MG per tablet Take 1-2 tablets by mouth every 6 (six) hours as needed for severe pain. Patient not taking: Reported on 06/15/2014 03/28/14   Merryl Hacker, MD  potassium chloride SA (K-DUR,KLOR-CON) 20 MEQ tablet Take 1 tablet (20 mEq total) by mouth daily. 06/20/14   Lorayne Marek, MD  sertraline (ZOLOFT) 100 MG tablet Take 1 tablet (100 mg total) by mouth daily. Patient not taking: Reported on 06/15/2014 06/13/14   Lorayne Marek, MD  traZODone (DESYREL) 50 MG tablet Take 1 tablet (50 mg total) by mouth at bedtime. 01/17/14   Lorayne Marek, MD  warfarin (COUMADIN) 5 MG tablet Take 1 tablet (5 mg total) by mouth daily. 06/13/14   Lorayne Marek, MD   BP 123/58 mmHg  Pulse 103  Temp(Src) 98.3 F (36.8 C) (Oral)  Resp 20  SpO2 96%  LMP 05/24/2003 Physical Exam  Constitutional: She is oriented to person, place, and time. She appears well-developed and well-nourished. No distress.  HENT:  Head: Normocephalic and atraumatic.  Neck: Neck supple. No tracheal deviation present.  Cardiovascular: Normal rate.   Pulmonary/Chest: Effort normal. No respiratory distress.  Musculoskeletal: Normal range of motion.  Neurological: She is alert and oriented to person, place, and time.  Skin: Skin is warm and dry. Rash noted.  Vesicular rash over the right upper neck and shoulder. With one vesicular lesion under right axilla.   Psychiatric: She has a normal mood and affect. Her behavior is normal.  Nursing note and vitals reviewed.   ED Course  Procedures (including critical care time) DIAGNOSTIC STUDIES: Oxygen Saturation is 96% on RA, normal by my interpretation.    COORDINATION OF CARE: 9:14 PM- Pt advised of plan for treatment which includes medication and pt agrees.  Labs Review Labs Reviewed - No data to display  Imaging Review No results found.   EKG Interpretation None      MDM   Final diagnoses:  Shingles rash   59 yo with new  onset of rash consistent with the presentation of shingles.  Her pain was managed in the ED.  Discussed use of anti-viral medicine and avoiding immunocompromised family members. Instructed pt to follow-up with primary care provider in a few days for re-evaluation.  Pt is well-appearing, in no acute distress and vital signs reviewed. She appears safe to be discharged.   Return precautions provided. Pt aware of plan and in agreement.   I personally performed the  services described in this documentation, which was scribed in my presence. The recorded information has been reviewed and is accurate.  Filed Vitals:   07/18/14 2020  BP: 123/58  Pulse: 103  Temp: 98.3 F (36.8 C)  TempSrc: Oral  Resp: 20  SpO2: 96%   Meds given in ED:  Medications  HYDROcodone-acetaminophen (NORCO/VICODIN) 5-325 MG per tablet 1 tablet (1 tablet Oral Given 07/18/14 2133)    Discharge Medication List as of 07/18/2014  9:37 PM    START taking these medications   Details  acyclovir (ZOVIRAX) 400 MG tablet Take 2 tablets (800 mg total) by mouth 5 (five) times daily., Starting 07/18/2014, Until Discontinued, Print    HYDROcodone-acetaminophen (NORCO/VICODIN) 5-325 MG per tablet Take 1 tablet by mouth every 4 (four) hours as needed for moderate pain or severe pain., Starting 07/18/2014, Until Discontinued, Print           Britt Bottom, NP 07/20/14 4158  Ephraim Hamburger, MD 07/21/14 [phone]

## 2014-07-18 NOTE — ED Notes (Signed)
Pt states that she started having painful and itchy lesions along the right side of her upper back radiating downward to her rt flank.

## 2014-07-19 ENCOUNTER — Telehealth: Payer: Self-pay | Admitting: Internal Medicine

## 2014-07-20 ENCOUNTER — Telehealth: Payer: Self-pay

## 2014-07-20 MED ORDER — POTASSIUM CHLORIDE CRYS ER 20 MEQ PO TBCR: 20.0000 meq | EXTENDED_RELEASE_TABLET | Freq: Every day | ORAL | Status: DC

## 2014-07-20 NOTE — Telephone Encounter (Signed)
-----   Message from Lorayne Marek, MD sent at 06/14/2014  9:12 AM EST ----- Blood work reviewed  potassium is borderline low, advise patient to take KCL 20 mEq daily for 10 days.

## 2014-07-20 NOTE — Telephone Encounter (Signed)
Patient is aware of  Her lab results Prescription sent to [facility]

## 2014-07-28 ENCOUNTER — Ambulatory Visit: Payer: Commercial Managed Care - HMO | Admitting: Internal Medicine

## 2014-08-09 ENCOUNTER — Encounter (HOSPITAL_COMMUNITY): Payer: Self-pay | Admitting: Emergency Medicine

## 2014-08-09 ENCOUNTER — Emergency Department (HOSPITAL_COMMUNITY)
Admission: EM | Admit: 2014-08-09 | Discharge: 2014-08-09 | Disposition: A | Discharge status: Home / Self Care | Payer: Commercial Managed Care - HMO | Attending: Emergency Medicine | Admitting: Emergency Medicine

## 2014-08-09 DIAGNOSIS — Z792 Long term (current) use of antibiotics: Secondary | ICD-10-CM | POA: Insufficient documentation

## 2014-08-09 DIAGNOSIS — I10 Essential (primary) hypertension: Secondary | ICD-10-CM | POA: Diagnosis not present

## 2014-08-09 DIAGNOSIS — Z85038 Personal history of other malignant neoplasm of large intestine: Secondary | ICD-10-CM | POA: Insufficient documentation

## 2014-08-09 DIAGNOSIS — R21 Rash and other nonspecific skin eruption: Secondary | ICD-10-CM | POA: Insufficient documentation

## 2014-08-09 DIAGNOSIS — Z720 Tobacco use: Secondary | ICD-10-CM | POA: Insufficient documentation

## 2014-08-09 DIAGNOSIS — Z859 Personal history of malignant neoplasm, unspecified: Secondary | ICD-10-CM | POA: Insufficient documentation

## 2014-08-09 DIAGNOSIS — F329 Major depressive disorder, single episode, unspecified: Secondary | ICD-10-CM | POA: Insufficient documentation

## 2014-08-09 DIAGNOSIS — Z79899 Other long term (current) drug therapy: Secondary | ICD-10-CM | POA: Diagnosis not present

## 2014-08-09 DIAGNOSIS — G43909 Migraine, unspecified, not intractable, without status migrainosus: Secondary | ICD-10-CM | POA: Insufficient documentation

## 2014-08-09 DIAGNOSIS — F419 Anxiety disorder, unspecified: Secondary | ICD-10-CM | POA: Insufficient documentation

## 2014-08-09 DIAGNOSIS — G8929 Other chronic pain: Secondary | ICD-10-CM | POA: Diagnosis not present

## 2014-08-09 DIAGNOSIS — Z8669 Personal history of other diseases of the nervous system and sense organs: Secondary | ICD-10-CM | POA: Insufficient documentation

## 2014-08-09 DIAGNOSIS — K219 Gastro-esophageal reflux disease without esophagitis: Secondary | ICD-10-CM | POA: Diagnosis not present

## 2014-08-09 DIAGNOSIS — M1990 Unspecified osteoarthritis, unspecified site: Secondary | ICD-10-CM | POA: Insufficient documentation

## 2014-08-09 DIAGNOSIS — Z8742 Personal history of other diseases of the female genital tract: Secondary | ICD-10-CM | POA: Insufficient documentation

## 2014-08-09 DIAGNOSIS — Z7901 Long term (current) use of anticoagulants: Secondary | ICD-10-CM | POA: Diagnosis not present

## 2014-08-09 DIAGNOSIS — Z86718 Personal history of other venous thrombosis and embolism: Secondary | ICD-10-CM | POA: Diagnosis not present

## 2014-08-09 DIAGNOSIS — J449 Chronic obstructive pulmonary disease, unspecified: Secondary | ICD-10-CM | POA: Insufficient documentation

## 2014-08-09 MED ORDER — OXYCODONE-ACETAMINOPHEN 5-325 MG PO TABS: 1.0000 | ORAL_TABLET | ORAL | Status: DC | PRN

## 2014-08-09 MED ORDER — OXYCODONE-ACETAMINOPHEN 5-325 MG PO TABS
2.0000 | ORAL_TABLET | Freq: Once | ORAL | Status: AC
  Administered 2014-08-09: 2 via ORAL
  Filled 2014-08-09: qty 2

## 2014-08-09 MED ORDER — CLINDAMYCIN HCL 150 MG PO CAPS: 150.0000 mg | ORAL_CAPSULE | Freq: Four times a day (QID) | ORAL | Status: DC

## 2014-08-09 NOTE — Discharge Instructions (Signed)
Community-Associated MRSA °CA-MRSA stands for community-associated methicillin-resistant Staphylococcus aureus. MRSA is a type of bacteria that is resistant to some common antibiotics. It can cause infections in the skin and many other places in the body. Staphylococcus aureus, often called "staph," is a bacteria that normally lives on the skin or in the nose. Staph on the surface of the skin or in the nose does not cause problems. However, if the staph enters the body through a cut, wound, or break in the skin, an infection can happen. °Up until recently, infections with the MRSA type of staph mainly occurred in hospitals and other health care settings. There are now increasing problems with MRSA infections in the community as well. Infections with MRSA may be very serious or even life threatening. °CA-MRSA is becoming more common. It is known to spread in crowded settings, in jails and prisons, and in situations where there is close skin-to-skin contact, such as during sporting events or in locker rooms. MRSA can be spread through shared items, such as children's toys, razors, towels, or sports equipment.  °CAUSES °All staph, including MRSA, are normally harmless unless they enter the body through a scratch, cut, or wound, such as with surgery. All staph, including MRSA, can be spread from person-to-person by touching contaminated objects or through direct contact. °· MRSA now causes illness in people who have not been in hospitals or other health care facilities. Cases of MRSA diseases in the community have been associated with: °¨ Recent antibiotic use. °¨ Sharing contaminated towels or clothes. °¨ Having active skin diseases. °¨ Participating in contact sports. °¨ Living in crowded settings. °¨ Intravenous (IV) drug use. °· Community-associated MRSA infections are usually skin infections, but may cause other severe illnesses. °· Staph bacteria are one of the most common causes of skin infection. However, they  are also a common cause of pneumonia, bone or joint infections, and bloodstream infections. °DIAGNOSIS °Diagnosis of MRSA is done by cultures of fluid samples that may come from: °· Swabs taken from cuts or wounds in infected areas. °· Nasal swabs. °· Saliva or deep cough specimens from the lungs (sputum). °· Urine. °· Blood. °Many people are "colonized" with MRSA but have no signs of infection. This means that people carry the MRSA germ on their skin or in their nose and may never develop MRSA infection.  °TREATMENT  °Treatment varies and is based on how serious, how deep, or how extensive the infection is. For example: °· Some skin infections, such as a small boil or abscess, may be treated by draining yellowish-white fluid (pus) from the site of the infection. °· Deeper or more widespread soft tissue infections are usually treated with surgery to drain pus and with antibiotic medicine given by vein or by mouth. This may be recommended even if you are pregnant. °· Serious infections may require a hospital stay. °If antibiotics are given, they may be needed for several weeks. °PREVENTION °Because many people are colonized with staph, including MRSA, preventing the spread of the bacteria from person-to-person is most important. The best way to prevent the spread of bacteria and other germs is through proper hand washing or by using alcohol-based hand disinfectants. The following are other ways to help prevent MRSA infection within community settings.  °· Wash your hands frequently with soap and water for at least 15 seconds. Otherwise, use alcohol-based hand disinfectants when soap and water is not available. °· Make sure people who live with you wash their hands often, too. °·   Do not share personal items. For example, avoid sharing razors and other personal hygiene items, towels, clothing, and athletic equipment. °· Wash and dry your clothes and bedding at the warmest temperatures recommended on the labels. °· Keep  wounds covered. Pus from infected sores may contain MRSA and other bacteria. Keep cuts and abrasions clean and covered with germ-free (sterile), dry bandages until they are healed. °· If you have a wound that appears infected, ask your caregiver if a culture for MRSA and other bacteria should be done. °· If you are breastfeeding, talk to your caregiver about MRSA. You may be asked to temporarily stop breastfeeding. °HOME CARE INSTRUCTIONS  °· Take your antibiotics as directed. Finish them even if you start to feel better. °· Avoid close contact with those around you as much as possible. Do not use towels, razors, toothbrushes, bedding, or other items that will be used by others. °· To fight the infection, follow your caregiver's instructions for wound care. Wash your hands before and after changing your bandages. °· If you have an intravascular device, such as a catheter, make sure you know how to care for it. °· Be sure to tell any health care providers that you have MRSA so they are aware of your infection. °SEEK IMMEDIATE MEDICAL CARE IF: °· The infection appears to be getting worse. Signs include: °¨ Increased warmth, redness, or tenderness around the wound site. °¨ A red line that extends from the infection site. °¨ A dark color in the area around the infection. °¨ Wound drainage that is tan, yellow, or green. °¨ A bad smell coming from the wound. °· You feel sick to your stomach (nauseous) and throw up (vomit) or cannot keep medicine down. °· You have a fever. °· Your baby is older than 3 months with a rectal temperature of 102°F (38.9°C) or higher. °· Your baby is 3 months old or younger with a rectal temperature of 100.4°F (38°C) or higher. °· You have difficulty breathing. °MAKE SURE YOU:  °· Understand these instructions. °· Will watch your condition. °· Will get help right away if you are not doing well or get worse. °Document Released: 08/29/2005 Document Revised: 10/10/2013 Document Reviewed:  08/29/2010 °[employer]® Patient Information ©2015 [employer]. This information is not intended to replace advice given to you by your health care provider. Make sure you discuss any questions you have with your health care provider. ° °

## 2014-08-09 NOTE — ED Provider Notes (Signed)
CSN: [id number]     Arrival date & time 08/09/14  0900 History   First MD Initiated Contact with Patient 08/09/14 0914     No chief complaint on file.    (Consider location/radiation/quality/duration/timing/severity/associated sxs/prior Treatment) HPI Comments: Patient here complaining of painful areas to the right posterior neck at the shoulder as well as painful red area at the back of her left arm. She also notes a painful area at the left flank. Was treated for shingles recently. Pain characterized as sharp and worse with movement. No fever noted. No neck pain. Prior location of her shingles was on the right neck going down her right shoulder. Symptoms are worse with movement and better with remaining still. No treatment use prior to arrival  The history is provided by the patient.    Past Medical History  Diagnosis Date  . Hypertension   . Asthma   . COPD (chronic obstructive pulmonary disease)   . Anxiety   . Cancer   . Colon cancer   . Dyslipidemia   . Migraine headache   . Chronic bronchitis   . Uterine fibroid   . Ovarian cyst   . GERD (gastroesophageal reflux disease)   . Morbid obesity   . Obstructive sleep apnea     mild  . Chronic pain   . Chronic back pain   . Arthritis   . Osteoarthritis   . Depression   . DVT (deep vein thrombosis) in pregnancy    Past Surgical History  Procedure Laterality Date  . Knee arthroscopy      bil.  . Carpal tunnel release      rt  . Mouth surgery      teeth extraction  . Cesarean section    . Subtotal colectomy    . Colonoscopy    . Tubal ligation     Family History  Problem Relation Age of Onset  . Uterine cancer Mother   . Stroke Father   . Colon cancer Maternal Uncle   . Diabetes Father   . Ovarian cancer Mother   . Hypertension Father   . Breast cancer Maternal Grandmother   . Diabetes Sister   . Asthma Child   . Asthma Child    History  Substance Use Topics  . Smoking status: Current Some Day Smoker --  1.00 packs/day for 37 years    Types: Cigarettes  . Smokeless tobacco: Never Used     Comment: trying to quit, down to .5ppd X2 years ago.   . Alcohol Use: No   OB History    Gravida Para Term Preterm AB TAB SAB Ectopic Multiple Living   2 2 2       2      Review of Systems  All other systems reviewed and are negative.     Allergies  Kiwi extract and Aspirin  Home Medications   Prior to Admission medications   Medication Sig Start Date End Date Taking? Authorizing Provider  acetaminophen (TYLENOL) 500 MG tablet Take 500 mg by mouth every 6 (six) hours as needed for headache.    Historical Provider, MD  acyclovir (ZOVIRAX) 400 MG tablet Take 2 tablets (800 mg total) by mouth 5 (five) times daily. 07/18/14   Britt Bottom, NP  albuterol (PROVENTIL HFA;VENTOLIN HFA) 108 (90 BASE) MCG/ACT inhaler Inhale 2 puffs into the lungs every 6 (six) hours as needed for wheezing or shortness of breath (shortness of breath). 02/20/14   Oswald Hillock, MD  clindamycin (CLEOCIN)  300 MG capsule Take 1 capsule (300 mg total) by mouth 3 (three) times daily. May open cap and take with liquid 06/25/14   Jerrell Belfast, MD  cyclobenzaprine (FLEXERIL) 10 MG tablet Take 1 tablet (10 mg total) by mouth 2 (two) times daily as needed for muscle spasms. 06/15/14   Evelina Bucy, MD  fluticasone (FLOVENT HFA) 110 MCG/ACT inhaler Inhale 2 puffs into the lungs every 12 (twelve) hours. 01/17/14   Lorayne Marek, MD  furosemide (LASIX) 20 MG tablet Take 1 tablet (20 mg total) by mouth daily. 06/20/14   Lorayne Marek, MD  HYDROcodone-acetaminophen (HYCET) 7.5-325 mg/15 ml solution Take 10-15 mLs by mouth every 4 (four) hours as needed for moderate pain. 06/25/14   Jerrell Belfast, MD  HYDROcodone-acetaminophen (NORCO/VICODIN) 5-325 MG per tablet Take 1 tablet by mouth every 4 (four) hours as needed for moderate pain or severe pain. 07/18/14   Britt Bottom, NP  LORazepam (ATIVAN) 1 MG tablet Take 1 tablet (1 mg total) by  mouth every 6 (six) hours as needed for anxiety (anxiety). 06/13/14   Lorayne Marek, MD  omeprazole (PRILOSEC) 40 MG capsule Take 1 capsule (40 mg total) by mouth daily. 06/13/14   Lorayne Marek, MD  oxyCODONE-acetaminophen (PERCOCET/ROXICET) 5-325 MG per tablet Take 1-2 tablets by mouth every 6 (six) hours as needed for severe pain. Patient not taking: Reported on 06/15/2014 03/28/14   Merryl Hacker, MD  potassium chloride SA (K-DUR,KLOR-CON) 20 MEQ tablet Take 1 tablet (20 mEq total) by mouth daily. 06/20/14   Lorayne Marek, MD  potassium chloride SA (K-DUR,KLOR-CON) 20 MEQ tablet Take 1 tablet (20 mEq total) by mouth daily. 07/20/14   Lorayne Marek, MD  sertraline (ZOLOFT) 100 MG tablet Take 1 tablet (100 mg total) by mouth daily. Patient not taking: Reported on 06/15/2014 06/13/14   Lorayne Marek, MD  traZODone (DESYREL) 50 MG tablet Take 1 tablet (50 mg total) by mouth at bedtime. 01/17/14   Lorayne Marek, MD  warfarin (COUMADIN) 5 MG tablet Take 1 tablet (5 mg total) by mouth daily. 06/13/14   Lorayne Marek, MD   LMP 05/24/2003 Physical Exam  Constitutional: She is oriented to person, place, and time. She appears well-developed and well-nourished.  Non-toxic appearance. No distress.  HENT:  Head: Normocephalic and atraumatic.  Eyes: Conjunctivae, EOM and lids are normal. Pupils are equal, round, and reactive to light.  Neck: Normal range of motion. Neck supple. No tracheal deviation present. No thyroid mass present.  Cardiovascular: Normal rate, regular rhythm and normal heart sounds.  Exam reveals no gallop.   No murmur heard. Pulmonary/Chest: Effort normal and breath sounds normal. No stridor. No respiratory distress. She has no decreased breath sounds. She has no wheezes. She has no rhonchi. She has no rales.  Abdominal: Soft. Normal appearance and bowel sounds are normal. She exhibits no distension. There is no tenderness. There is no rebound and no CVA tenderness.  Musculoskeletal: Normal range  of motion. She exhibits no edema or tenderness.       Arms: Neurological: She is alert and oriented to person, place, and time. She has normal strength. No cranial nerve deficit or sensory deficit. GCS eye subscore is 4. GCS verbal subscore is 5. GCS motor subscore is 6.  Skin: Skin is warm and dry. No abrasion and no rash noted.  Psychiatric: She has a normal mood and affect. Her speech is normal and behavior is normal.  Nursing note and vitals reviewed.   ED Course  Procedures (including  critical care time) Labs Review Labs Reviewed - No data to display  Imaging Review No results found.   EKG Interpretation None      MDM   Final diagnoses:  None    Patient with likely MRSA infection. Will place on clindamycin    Leota Jacobsen, MD 08/09/14 [phone]

## 2014-08-09 NOTE — ED Notes (Signed)
Pt recently here with dx of shingles. Pt states that painful rash comes up and goes under left arm, lower back. Areas of rash are not open, no drainage. Pain 10/10

## 2014-08-16 ENCOUNTER — Telehealth: Payer: Self-pay | Admitting: General Practice

## 2014-08-16 NOTE — Telephone Encounter (Signed)
Patient calling to request refill for LORazepam (ATIVAN) 1 MG tablet.  Patient uses [facility] on [address]

## 2014-08-18 ENCOUNTER — Telehealth: Payer: Self-pay | Admitting: Internal Medicine

## 2014-08-18 NOTE — Telephone Encounter (Signed)
Pt is requesting a refill for LORazepam (ATIVAN) 1 MG tablet

## 2014-08-23 ENCOUNTER — Telehealth: Payer: Self-pay | Admitting: Emergency Medicine

## 2014-08-25 ENCOUNTER — Telehealth: Payer: Self-pay

## 2014-08-25 ENCOUNTER — Telehealth: Payer: Self-pay | Admitting: Internal Medicine

## 2014-08-25 DIAGNOSIS — F419 Anxiety disorder, unspecified: Secondary | ICD-10-CM

## 2014-08-25 DIAGNOSIS — I8290 Acute embolism and thrombosis of unspecified vein: Secondary | ICD-10-CM

## 2014-08-25 DIAGNOSIS — K219 Gastro-esophageal reflux disease without esophagitis: Secondary | ICD-10-CM

## 2014-08-25 MED ORDER — OMEPRAZOLE 40 MG PO CPDR: 40.0000 mg | DELAYED_RELEASE_CAPSULE | Freq: Every day | ORAL | Status: DC

## 2014-08-25 MED ORDER — WARFARIN SODIUM 5 MG PO TABS: 5.0000 mg | ORAL_TABLET | Freq: Every day | ORAL | Status: DC

## 2014-08-25 MED ORDER — POTASSIUM CHLORIDE CRYS ER 20 MEQ PO TBCR: 20.0000 meq | EXTENDED_RELEASE_TABLET | Freq: Every day | ORAL | Status: DC

## 2014-08-25 MED ORDER — LORAZEPAM 1 MG PO TABS: 1.0000 mg | ORAL_TABLET | Freq: Four times a day (QID) | ORAL | Status: DC | PRN

## 2014-08-25 MED ORDER — FUROSEMIDE 20 MG PO TABS: 20.0000 mg | ORAL_TABLET | Freq: Every day | ORAL | Status: DC

## 2014-08-25 NOTE — Telephone Encounter (Signed)
Pt called requesting medication refills,pt has not received a response back. An additonal medication pt is requesting is LORazepam (ATIVAN) 1 MG tablet. Pt [facility] . Please f/u with pt concerning refills

## 2014-08-25 NOTE — Telephone Encounter (Signed)
Patient called requesting refill on her ativan Prescription printed and is at front desk

## 2014-09-05 ENCOUNTER — Other Ambulatory Visit: Payer: Self-pay | Admitting: Internal Medicine

## 2014-09-05 DIAGNOSIS — Z1231 Encounter for screening mammogram for malignant neoplasm of breast: Secondary | ICD-10-CM

## 2014-09-07 ENCOUNTER — Ambulatory Visit (HOSPITAL_COMMUNITY)
Admission: RE | Admit: 2014-09-07 | Discharge: 2014-09-07 | Disposition: A | Discharge status: Home / Self Care | Payer: Commercial Managed Care - HMO | Source: Ambulatory Visit | Attending: Internal Medicine | Admitting: Internal Medicine

## 2014-09-07 ENCOUNTER — Other Ambulatory Visit (HOSPITAL_COMMUNITY): Payer: Self-pay | Admitting: Internal Medicine

## 2014-09-07 DIAGNOSIS — Z1231 Encounter for screening mammogram for malignant neoplasm of breast: Secondary | ICD-10-CM | POA: Insufficient documentation

## 2014-09-23 ENCOUNTER — Emergency Department (HOSPITAL_COMMUNITY)
Admission: EM | Admit: 2014-09-23 | Discharge: 2014-09-23 | Disposition: A | Discharge status: Home / Self Care | Payer: Commercial Managed Care - HMO | Attending: Emergency Medicine | Admitting: Emergency Medicine

## 2014-09-23 ENCOUNTER — Emergency Department (HOSPITAL_COMMUNITY): Payer: Commercial Managed Care - HMO

## 2014-09-23 ENCOUNTER — Encounter (HOSPITAL_COMMUNITY): Payer: Self-pay | Admitting: Emergency Medicine

## 2014-09-23 DIAGNOSIS — M1990 Unspecified osteoarthritis, unspecified site: Secondary | ICD-10-CM | POA: Diagnosis not present

## 2014-09-23 DIAGNOSIS — R05 Cough: Secondary | ICD-10-CM | POA: Insufficient documentation

## 2014-09-23 DIAGNOSIS — M7989 Other specified soft tissue disorders: Secondary | ICD-10-CM | POA: Diagnosis not present

## 2014-09-23 DIAGNOSIS — J441 Chronic obstructive pulmonary disease with (acute) exacerbation: Secondary | ICD-10-CM | POA: Insufficient documentation

## 2014-09-23 DIAGNOSIS — I10 Essential (primary) hypertension: Secondary | ICD-10-CM | POA: Diagnosis not present

## 2014-09-23 DIAGNOSIS — Z8742 Personal history of other diseases of the female genital tract: Secondary | ICD-10-CM | POA: Insufficient documentation

## 2014-09-23 DIAGNOSIS — Z85038 Personal history of other malignant neoplasm of large intestine: Secondary | ICD-10-CM | POA: Insufficient documentation

## 2014-09-23 DIAGNOSIS — R059 Cough, unspecified: Secondary | ICD-10-CM

## 2014-09-23 DIAGNOSIS — K219 Gastro-esophageal reflux disease without esophagitis: Secondary | ICD-10-CM | POA: Insufficient documentation

## 2014-09-23 DIAGNOSIS — Z7901 Long term (current) use of anticoagulants: Secondary | ICD-10-CM | POA: Insufficient documentation

## 2014-09-23 DIAGNOSIS — G8929 Other chronic pain: Secondary | ICD-10-CM | POA: Insufficient documentation

## 2014-09-23 DIAGNOSIS — Z720 Tobacco use: Secondary | ICD-10-CM | POA: Diagnosis not present

## 2014-09-23 DIAGNOSIS — F419 Anxiety disorder, unspecified: Secondary | ICD-10-CM | POA: Diagnosis not present

## 2014-09-23 DIAGNOSIS — G43909 Migraine, unspecified, not intractable, without status migrainosus: Secondary | ICD-10-CM | POA: Diagnosis not present

## 2014-09-23 DIAGNOSIS — Z79899 Other long term (current) drug therapy: Secondary | ICD-10-CM | POA: Diagnosis not present

## 2014-09-23 DIAGNOSIS — F329 Major depressive disorder, single episode, unspecified: Secondary | ICD-10-CM | POA: Diagnosis not present

## 2014-09-23 DIAGNOSIS — Z86718 Personal history of other venous thrombosis and embolism: Secondary | ICD-10-CM | POA: Diagnosis not present

## 2014-09-23 LAB — CBC WITH DIFFERENTIAL/PLATELET
BASOS ABS: 0 10*3/uL (ref 0.0–0.1)
BASOS PCT: 0 % (ref 0–1)
EOS ABS: 0.2 10*3/uL (ref 0.0–0.7)
Eosinophils Relative: 3 % (ref 0–5)
HCT: 39.2 % (ref 36.0–46.0)
Hemoglobin: 12.5 g/dL (ref 12.0–15.0)
LYMPHS ABS: 2.1 10*3/uL (ref 0.7–4.0)
LYMPHS PCT: 43 % (ref 12–46)
MCH: 32 pg (ref 26.0–34.0)
MCHC: 31.9 g/dL (ref 30.0–36.0)
MCV: 100.3 fL — ABNORMAL HIGH (ref 78.0–100.0)
Monocytes Absolute: 0.4 10*3/uL (ref 0.1–1.0)
Monocytes Relative: 9 % (ref 3–12)
NEUTROS PCT: 45 % (ref 43–77)
Neutro Abs: 2.2 10*3/uL (ref 1.7–7.7)
Platelets: 180 10*3/uL (ref 150–400)
RBC: 3.91 MIL/uL (ref 3.87–5.11)
RDW: 14.3 % (ref 11.5–15.5)
WBC: 5 10*3/uL (ref 4.0–10.5)

## 2014-09-23 LAB — I-STAT TROPONIN, ED: Troponin i, poc: 0 ng/mL (ref 0.00–0.08)

## 2014-09-23 LAB — COMPREHENSIVE METABOLIC PANEL
ALT: 10 U/L (ref 0–35)
AST: 19 U/L (ref 0–37)
Albumin: 3.4 g/dL — ABNORMAL LOW (ref 3.5–5.2)
Alkaline Phosphatase: 75 U/L (ref 39–117)
Anion gap: 7 (ref 5–15)
BUN: 10 mg/dL (ref 6–23)
CHLORIDE: 102 mmol/L (ref 96–112)
CO2: 31 mmol/L (ref 19–32)
Calcium: 8.7 mg/dL (ref 8.4–10.5)
Creatinine, Ser: 0.75 mg/dL (ref 0.50–1.10)
GFR calc Af Amer: >90 mL/min (ref >90)
GFR calc non Af Amer: >90 mL/min (ref >90)
Glucose, Bld: 102 mg/dL — ABNORMAL HIGH (ref 70–99)
POTASSIUM: 3.7 mmol/L (ref 3.5–5.1)
Sodium: 140 mmol/L (ref 135–145)
Total Bilirubin: 0.3 mg/dL (ref 0.3–1.2)
Total Protein: 6.9 g/dL (ref 6.0–8.3)

## 2014-09-23 LAB — BRAIN NATRIURETIC PEPTIDE: B Natriuretic Peptide: 48.6 pg/mL (ref 0.0–100.0)

## 2014-09-23 LAB — PROTIME-INR
INR: 2.36 — ABNORMAL HIGH (ref 0.00–1.49)
PROTHROMBIN TIME: 26 s — AB (ref 11.6–15.2)

## 2014-09-23 MED ORDER — OXYCODONE-ACETAMINOPHEN 5-325 MG PO TABS: 1.0000 | ORAL_TABLET | Freq: Four times a day (QID) | ORAL | Status: DC | PRN

## 2014-09-23 MED ORDER — HYDROMORPHONE HCL 1 MG/ML IJ SOLN
0.5000 mg | Freq: Once | INTRAMUSCULAR | Status: AC
  Administered 2014-09-23: 0.5 mg via INTRAVENOUS
  Filled 2014-09-23: qty 1

## 2014-09-23 MED ORDER — OXYCODONE-ACETAMINOPHEN 5-325 MG PO TABS
1.0000 | ORAL_TABLET | Freq: Once | ORAL | Status: AC
  Administered 2014-09-23: 1 via ORAL
  Filled 2014-09-23: qty 1

## 2014-09-23 NOTE — Discharge Instructions (Signed)

## 2014-09-23 NOTE — Progress Notes (Signed)
VASCULAR LAB PRELIMINARY  PRELIMINARY  PRELIMINARY  PRELIMINARY  Bilateral lower extremity venous duplex completed.    Preliminary report:  Bilateral:  No evidence of DVT, superficial thrombosis, or Baker's Cyst.   Crimson Beer, RVS 09/23/2014, 2:41 PM

## 2014-09-23 NOTE — ED Provider Notes (Signed)
CSN: [id number]     Arrival date & time 09/23/14  1019 History   First MD Initiated Contact with Patient 09/23/14 1028     Chief Complaint  Patient presents with  . Leg Swelling     (Consider location/radiation/quality/duration/timing/severity/associated sxs/prior Treatment) Patient is a 59 y.o. female presenting with leg pain. The history is provided by the patient.  Leg Pain Location:  Leg Time since incident:  3 days Injury: no   Leg location:  L leg and R leg Pain details:    Quality:  Aching   Radiates to:  Does not radiate   Severity:  Mild   Onset quality:  Gradual   Duration:  3 days   Timing:  Constant   Progression:  Unchanged Chronicity:  New Dislocation: no   Prior injury to area:  No Relieved by:  Nothing Worsened by:  Nothing tried Ineffective treatments:  Acetaminophen Associated symptoms: no back pain, no fatigue, no fever and no neck pain     Past Medical History  Diagnosis Date  . Hypertension   . Asthma   . COPD (chronic obstructive pulmonary disease)   . Anxiety   . Cancer   . Colon cancer   . Dyslipidemia   . Migraine headache   . Chronic bronchitis   . Uterine fibroid   . Ovarian cyst   . GERD (gastroesophageal reflux disease)   . Morbid obesity   . Obstructive sleep apnea     mild  . Chronic pain   . Chronic back pain   . Arthritis   . Osteoarthritis   . Depression   . DVT (deep vein thrombosis) in pregnancy    Past Surgical History  Procedure Laterality Date  . Knee arthroscopy      bil.  . Carpal tunnel release      rt  . Mouth surgery      teeth extraction  . Cesarean section    . Subtotal colectomy    . Colonoscopy    . Tubal ligation     Family History  Problem Relation Age of Onset  . Uterine cancer Mother   . Stroke Father   . Colon cancer Maternal Uncle   . Diabetes Father   . Ovarian cancer Mother   . Hypertension Father   . Breast cancer Maternal Grandmother   . Diabetes Sister   . Asthma Child   .  Asthma Child    History  Substance Use Topics  . Smoking status: Current Some Day Smoker -- 1.00 packs/day for 37 years    Types: Cigarettes  . Smokeless tobacco: Never Used     Comment: trying to quit, down to .5ppd X2 years ago.   . Alcohol Use: No   OB History    Gravida Para Term Preterm AB TAB SAB Ectopic Multiple Living   2 2 2       2      Review of Systems  Constitutional: Negative for fever and fatigue.  HENT: Negative for congestion and drooling.   Eyes: Negative for pain.  Respiratory: Positive for cough and shortness of breath (chronic).   Cardiovascular: Negative for chest pain.  Gastrointestinal: Negative for nausea, vomiting, abdominal pain and diarrhea.  Genitourinary: Negative for dysuria and hematuria.  Musculoskeletal: Negative for back pain, gait problem and neck pain.  Skin: Negative for color change.  Neurological: Negative for dizziness and headaches.  Hematological: Negative for adenopathy.  Psychiatric/Behavioral: Negative for behavioral problems.  All other systems reviewed and  are negative.     Allergies  Kiwi extract and Aspirin  Home Medications   Prior to Admission medications   Medication Sig Start Date End Date Taking? Authorizing Provider  acetaminophen (TYLENOL) 500 MG tablet Take 500 mg by mouth every 6 (six) hours as needed for headache.    Historical Provider, MD  acyclovir (ZOVIRAX) 400 MG tablet Take 2 tablets (800 mg total) by mouth 5 (five) times daily. Patient not taking: Reported on 08/09/2014 07/18/14   Britt Bottom, NP  albuterol (PROVENTIL HFA;VENTOLIN HFA) 108 (90 BASE) MCG/ACT inhaler Inhale 2 puffs into the lungs every 6 (six) hours as needed for wheezing or shortness of breath (shortness of breath). 02/20/14   Oswald Hillock, MD  clindamycin (CLEOCIN) 150 MG capsule Take 1 capsule (150 mg total) by mouth every 6 (six) hours. 08/09/14   Lacretia Leigh, MD  cyclobenzaprine (FLEXERIL) 10 MG tablet Take 1 tablet (10 mg total) by  mouth 2 (two) times daily as needed for muscle spasms. 06/15/14   Evelina Bucy, MD  fluticasone (FLOVENT HFA) 110 MCG/ACT inhaler Inhale 2 puffs into the lungs every 12 (twelve) hours. Patient taking differently: Inhale 2 puffs into the lungs every 12 (twelve) hours as needed (wheezing, and shortness of  breath).  01/17/14   Lorayne Marek, MD  furosemide (LASIX) 20 MG tablet Take 1 tablet (20 mg total) by mouth daily. 08/25/14   Lorayne Marek, MD  HYDROcodone-acetaminophen (HYCET) 7.5-325 mg/15 ml solution Take 10-15 mLs by mouth every 4 (four) hours as needed for moderate pain. Patient not taking: Reported on 08/09/2014 06/25/14   Jerrell Belfast, MD  HYDROcodone-acetaminophen (NORCO/VICODIN) 5-325 MG per tablet Take 1 tablet by mouth every 4 (four) hours as needed for moderate pain or severe pain. Patient not taking: Reported on 08/09/2014 07/18/14   Britt Bottom, NP  LORazepam (ATIVAN) 1 MG tablet Take 1 tablet (1 mg total) by mouth every 6 (six) hours as needed for anxiety (anxiety). 08/25/14   Lorayne Marek, MD  omeprazole (PRILOSEC) 40 MG capsule Take 1 capsule (40 mg total) by mouth daily. 08/25/14   Lorayne Marek, MD  oxyCODONE (ROXICODONE) 15 MG immediate release tablet Take 1 tablet by mouth 5 (five) times daily. 07/22/14   Historical Provider, MD  oxyCODONE-acetaminophen (PERCOCET/ROXICET) 5-325 MG per tablet Take 1-2 tablets by mouth every 4 (four) hours as needed for severe pain. 08/09/14   Lacretia Leigh, MD  potassium chloride SA (K-DUR,KLOR-CON) 20 MEQ tablet Take 1 tablet (20 mEq total) by mouth daily. Patient not taking: Reported on 08/09/2014 06/20/14   Lorayne Marek, MD  potassium chloride SA (K-DUR,KLOR-CON) 20 MEQ tablet Take 1 tablet (20 mEq total) by mouth daily. 08/25/14   Lorayne Marek, MD  traZODone (DESYREL) 50 MG tablet Take 1 tablet (50 mg total) by mouth at bedtime. Patient taking differently: Take 50 mg by mouth at bedtime as needed for sleep.  01/17/14   Lorayne Marek, MD  warfarin  (COUMADIN) 5 MG tablet Take 1 tablet (5 mg total) by mouth daily. 08/25/14   Lorayne Marek, MD   BP 137/62 mmHg  Pulse 54  Temp(Src) 98.1 F (36.7 C) (Oral)  Resp 20  SpO2 98%  LMP 05/24/2003 Physical Exam  Constitutional: She is oriented to person, place, and time. She appears well-developed and well-nourished.  HENT:  Head: Normocephalic.  Mouth/Throat: Oropharynx is clear and moist. No oropharyngeal exudate.  Eyes: Conjunctivae and EOM are normal. Pupils are equal, round, and reactive to light.  Neck: Normal range  of motion. Neck supple.  Cardiovascular: Normal rate, regular rhythm, normal heart sounds and intact distal pulses.  Exam reveals no gallop and no friction rub.   No murmur heard. Pulmonary/Chest: Effort normal and breath sounds normal. No respiratory distress. She has no wheezes.  Abdominal: Soft. Bowel sounds are normal. There is no tenderness. There is no rebound and no guarding.  Musculoskeletal: Normal range of motion. She exhibits edema (mild pitting edema in distal bilateral lower extremities.).  Normal range of motion of the left knee. No erythema or significant swelling noted. Mild reproduction of pain with range of motion.   Neurological: She is alert and oriented to person, place, and time.  Skin: Skin is warm and dry.  Psychiatric: She has a normal mood and affect. Her behavior is normal.  Nursing note and vitals reviewed.   ED Course  Procedures (including critical care time) Labs Review Labs Reviewed  CBC WITH DIFFERENTIAL/PLATELET - Abnormal; Notable for the following:    MCV 100.3 (*)    All other components within normal limits  COMPREHENSIVE METABOLIC PANEL - Abnormal; Notable for the following:    Glucose, Bld 102 (*)    Albumin 3.4 (*)    All other components within normal limits  PROTIME-INR - Abnormal; Notable for the following:    Prothrombin Time 26.0 (*)    INR 2.36 (*)    All other components within normal limits  BRAIN NATRIURETIC  PEPTIDE  I-STAT TROPOININ, ED    Imaging Review Dg Chest 2 View  09/23/2014   CLINICAL DATA:  Pt from home c/o bilateral leg swelling. Pt reports HX of PE and DVT " a few months ago". H/o COPD, Asthma, chronic bronchitis, colon cancer, controlled HTN. Smoker of 1 ppd x approximately 37 years.  EXAM: CHEST  2 VIEW  COMPARISON:  06/15/2014  FINDINGS: Cardiac silhouette is mildly enlarged. Normal mediastinal and hilar contours.  Clear lungs.  No pleural effusion or pneumothorax.  Bony thorax is intact.  IMPRESSION: No acute cardiopulmonary disease.   Electronically Signed   By: Lajean Manes M.D.   On: 09/23/2014 12:12     EKG Interpretation   Date/Time:  Saturday September 23 2014 11:36:22 EDT Ventricular Rate:  87 PR Interval:  146 QRS Duration: 77 QT Interval:  407 QTC Calculation: 490 R Axis:   43 Text Interpretation:  Sinus rhythm Ventricular bigeminy Low voltage,  precordial leads Borderline prolonged QT interval No significant change  was found Confirmed by Wyvonnia Dusky  MD, STEPHEN ([id number]) on 09/23/2014 11:45:44  AM      MDM   Final diagnoses:  Cough  Leg swelling    10:47 AM 59 y.o. female w hx of HTN, copd, obesity, DVT/PE on coumadin who presents with increasing swelling in her lower extremities over the last few days. She denies any chest pain. She states that she has a chronic waxing waning shortness of breath which is not significantly changed from baseline. She denies any fevers. She has had a mild cough. Afebrile and vital signs are unremarkable here. She complains of some left knee pain which she states is chronic. Low suspicion for septic arthritis after inspection of the left knee. We'll get screening labs and imaging. We'll repeat ultrasound of lower extremities. She states she is compliant with her Coumadin.  3:23 PM: I interpreted/reviewed the labs and/or imaging which were non-contributory.  Bilat DVT study neg. O2 sat normal on RA. Not tachy. No cp. Will rec pt continue  her Lasix. We'll recommend  she keep her legs elevated and wear compression stockings. Will provide a small prescription for pain medicine for her lower extremity pain associated with swelling.  I have discussed the diagnosis/risks/treatment options with the patient and believe the pt to be eligible for discharge home to follow-up with her pcp in 2 days. We also discussed returning to the ED immediately if new or worsening sx occur. We discussed the sx which are most concerning (e.g., cp, sob, fever) that necessitate immediate return. Medications administered to the patient during their visit and any new prescriptions provided to the patient are listed below.  Medications given during this visit Medications  oxyCODONE-acetaminophen (PERCOCET/ROXICET) 5-325 MG per tablet 1 tablet (1 tablet Oral Given 09/23/14 1157)  HYDROmorphone (DILAUDID) injection 0.5 mg (0.5 mg Intravenous Given 09/23/14 1337)    New Prescriptions   OXYCODONE-ACETAMINOPHEN (PERCOCET) 5-325 MG PER TABLET    Take 1 tablet by mouth every 6 (six) hours as needed for moderate pain.     Pamella Pert, MD 09/23/14 [phone]

## 2014-09-23 NOTE — ED Notes (Signed)
Nurse starting IV 

## 2014-09-23 NOTE — ED Notes (Signed)
VASCULAR AT [facility]

## 2014-09-23 NOTE — ED Notes (Signed)
Pt from home c/o bilateral leg swelling. Pt reports HX of PE and DVT " a few months ago". Poor historian.

## 2014-10-12 ENCOUNTER — Encounter: Payer: Self-pay | Admitting: Gastroenterology

## 2014-10-13 ENCOUNTER — Other Ambulatory Visit: Payer: Self-pay

## 2014-10-13 ENCOUNTER — Emergency Department (HOSPITAL_BASED_OUTPATIENT_CLINIC_OR_DEPARTMENT_OTHER): Payer: Commercial Managed Care - HMO

## 2014-10-13 ENCOUNTER — Encounter (HOSPITAL_BASED_OUTPATIENT_CLINIC_OR_DEPARTMENT_OTHER): Payer: Self-pay

## 2014-10-13 ENCOUNTER — Emergency Department (HOSPITAL_BASED_OUTPATIENT_CLINIC_OR_DEPARTMENT_OTHER)
Admission: EM | Admit: 2014-10-13 | Discharge: 2014-10-13 | Disposition: A | Discharge status: Home / Self Care | Payer: Commercial Managed Care - HMO | Attending: Emergency Medicine | Admitting: Emergency Medicine

## 2014-10-13 DIAGNOSIS — Y998 Other external cause status: Secondary | ICD-10-CM | POA: Insufficient documentation

## 2014-10-13 DIAGNOSIS — F329 Major depressive disorder, single episode, unspecified: Secondary | ICD-10-CM | POA: Diagnosis not present

## 2014-10-13 DIAGNOSIS — Y9289 Other specified places as the place of occurrence of the external cause: Secondary | ICD-10-CM | POA: Insufficient documentation

## 2014-10-13 DIAGNOSIS — S3992XA Unspecified injury of lower back, initial encounter: Secondary | ICD-10-CM | POA: Insufficient documentation

## 2014-10-13 DIAGNOSIS — M1990 Unspecified osteoarthritis, unspecified site: Secondary | ICD-10-CM | POA: Insufficient documentation

## 2014-10-13 DIAGNOSIS — W01198A Fall on same level from slipping, tripping and stumbling with subsequent striking against other object, initial encounter: Secondary | ICD-10-CM | POA: Diagnosis not present

## 2014-10-13 DIAGNOSIS — Y9389 Activity, other specified: Secondary | ICD-10-CM | POA: Diagnosis not present

## 2014-10-13 DIAGNOSIS — S0990XA Unspecified injury of head, initial encounter: Secondary | ICD-10-CM | POA: Diagnosis present

## 2014-10-13 DIAGNOSIS — Z79899 Other long term (current) drug therapy: Secondary | ICD-10-CM | POA: Diagnosis not present

## 2014-10-13 DIAGNOSIS — Z8742 Personal history of other diseases of the female genital tract: Secondary | ICD-10-CM | POA: Insufficient documentation

## 2014-10-13 DIAGNOSIS — E785 Hyperlipidemia, unspecified: Secondary | ICD-10-CM | POA: Diagnosis not present

## 2014-10-13 DIAGNOSIS — W108XXA Fall (on) (from) other stairs and steps, initial encounter: Secondary | ICD-10-CM | POA: Insufficient documentation

## 2014-10-13 DIAGNOSIS — W19XXXA Unspecified fall, initial encounter: Secondary | ICD-10-CM

## 2014-10-13 DIAGNOSIS — G8929 Other chronic pain: Secondary | ICD-10-CM | POA: Diagnosis not present

## 2014-10-13 DIAGNOSIS — R52 Pain, unspecified: Secondary | ICD-10-CM

## 2014-10-13 DIAGNOSIS — M5489 Other dorsalgia: Secondary | ICD-10-CM

## 2014-10-13 DIAGNOSIS — K219 Gastro-esophageal reflux disease without esophagitis: Secondary | ICD-10-CM | POA: Diagnosis not present

## 2014-10-13 DIAGNOSIS — G43909 Migraine, unspecified, not intractable, without status migrainosus: Secondary | ICD-10-CM | POA: Insufficient documentation

## 2014-10-13 DIAGNOSIS — I10 Essential (primary) hypertension: Secondary | ICD-10-CM | POA: Diagnosis not present

## 2014-10-13 DIAGNOSIS — Z859 Personal history of malignant neoplasm, unspecified: Secondary | ICD-10-CM | POA: Diagnosis not present

## 2014-10-13 DIAGNOSIS — J449 Chronic obstructive pulmonary disease, unspecified: Secondary | ICD-10-CM | POA: Diagnosis not present

## 2014-10-13 DIAGNOSIS — Z7901 Long term (current) use of anticoagulants: Secondary | ICD-10-CM | POA: Diagnosis not present

## 2014-10-13 DIAGNOSIS — Z720 Tobacco use: Secondary | ICD-10-CM | POA: Insufficient documentation

## 2014-10-13 DIAGNOSIS — Z86718 Personal history of other venous thrombosis and embolism: Secondary | ICD-10-CM | POA: Diagnosis not present

## 2014-10-13 DIAGNOSIS — F419 Anxiety disorder, unspecified: Secondary | ICD-10-CM | POA: Insufficient documentation

## 2014-10-13 DIAGNOSIS — Z8669 Personal history of other diseases of the nervous system and sense organs: Secondary | ICD-10-CM | POA: Insufficient documentation

## 2014-10-13 DIAGNOSIS — S29092A Other injury of muscle and tendon of back wall of thorax, initial encounter: Secondary | ICD-10-CM | POA: Diagnosis not present

## 2014-10-13 DIAGNOSIS — Z85038 Personal history of other malignant neoplasm of large intestine: Secondary | ICD-10-CM | POA: Insufficient documentation

## 2014-10-13 MED ORDER — OXYCODONE-ACETAMINOPHEN 5-325 MG PO TABS
2.0000 | ORAL_TABLET | Freq: Once | ORAL | Status: AC
  Administered 2014-10-13: 2 via ORAL
  Filled 2014-10-13: qty 2

## 2014-10-13 MED ORDER — OXYCODONE-ACETAMINOPHEN 5-325 MG PO TABS: 1.0000 | ORAL_TABLET | Freq: Four times a day (QID) | ORAL | Status: DC | PRN

## 2014-10-13 NOTE — Discharge Instructions (Signed)
Back Pain, Adult Low back pain is very common. About 1 in 5 people have back pain.The cause of low back pain is rarely dangerous. The pain often gets better over time.About half of people with a sudden onset of back pain feel better in just 2 weeks. About 8 in 10 people feel better by 6 weeks.  CAUSES Some common causes of back pain include:  Strain of the muscles or ligaments supporting the spine.  Wear and tear (degeneration) of the spinal discs.  Arthritis.  Direct injury to the back. DIAGNOSIS Most of the time, the direct cause of low back pain is not known.However, back pain can be treated effectively even when the exact cause of the pain is unknown.Answering your caregiver's questions about your overall health and symptoms is one of the most accurate ways to make sure the cause of your pain is not dangerous. If your caregiver needs more information, he or she may order lab work or imaging tests (X-rays or MRIs).However, even if imaging tests show changes in your back, this usually does not require surgery. HOME CARE INSTRUCTIONS For many people, back pain returns.Since low back pain is rarely dangerous, it is often a condition that people can learn to [facility] their own.   Remain active. It is stressful on the back to sit or stand in one place. Do not sit, drive, or stand in one place for more than 30 minutes at a time. Take short walks on level surfaces as soon as pain allows.Try to increase the length of time you walk each day.  Do not stay in bed.Resting more than 1 or 2 days can delay your recovery.  Do not avoid exercise or work.Your body is made to move.It is not dangerous to be active, even though your back may hurt.Your back will likely heal faster if you return to being active before your pain is gone.  Pay attention to your body when you bend and lift. Many people have less discomfortwhen lifting if they bend their knees, keep the load close to their bodies,and  avoid twisting. Often, the most comfortable positions are those that put less stress on your recovering back.  Find a comfortable position to sleep. Use a firm mattress and lie on your side with your knees slightly bent. If you lie on your back, put a pillow under your knees.  Only take over-the-counter or prescription medicines as directed by your caregiver. Over-the-counter medicines to reduce pain and inflammation are often the most helpful.Your caregiver may prescribe muscle relaxant drugs.These medicines help dull your pain so you can more quickly return to your normal activities and healthy exercise.  Put ice on the injured area.  Put ice in a plastic bag.  Place a towel between your skin and the bag.  Leave the ice on for 15-20 minutes, 03-04 times a day for the first 2 to 3 days. After that, ice and heat may be alternated to reduce pain and spasms.  Ask your caregiver about trying back exercises and gentle massage. This may be of some benefit.  Avoid feeling anxious or stressed.Stress increases muscle tension and can worsen back pain.It is important to recognize when you are anxious or stressed and learn ways to manage it.Exercise is a great option. SEEK MEDICAL CARE IF:  You have pain that is not relieved with rest or medicine.  You have pain that does not improve in 1 week.  You have new symptoms.  You are generally not feeling well. SEEK  IMMEDIATE MEDICAL CARE IF:   You have pain that radiates from your back into your legs.  You develop new bowel or bladder control problems.  You have unusual weakness or numbness in your arms or legs.  You develop nausea or vomiting.  You develop abdominal pain.  You feel faint. Document Released: 05/26/2005 Document Revised: 11/25/2011 Document Reviewed: 09/27/2013 [facility] Patient Information 2015 [place], [place]. This information is not intended to replace advice given to you by your health care provider. Make sure you  discuss any questions you have with your health care provider.  Head Injury You have received a head injury. It does not appear serious at this time. Headaches and vomiting are common following head injury. It should be easy to awaken from sleeping. Sometimes it is necessary for you to stay in the emergency department for a while for observation. Sometimes admission to the hospital may be needed. After injuries such as yours, most problems occur within the first 24 hours, but side effects may occur up to 7-10 days after the injury. It is important for you to carefully monitor your condition and contact your health care provider or seek immediate medical care if there is a change in your condition. WHAT ARE THE TYPES OF HEAD INJURIES? Head injuries can be as minor as a bump. Some head injuries can be more severe. More severe head injuries include:  A jarring injury to the brain (concussion).  A bruise of the brain (contusion). This mean there is bleeding in the brain that can cause swelling.  A cracked skull (skull fracture).  Bleeding in the brain that collects, clots, and forms a bump (hematoma). WHAT CAUSES A HEAD INJURY? A serious head injury is most likely to happen to someone who is in a car wreck and is not wearing a seat belt. Other causes of major head injuries include bicycle or motorcycle accidents, sports injuries, and falls. HOW ARE HEAD INJURIES DIAGNOSED? A complete history of the event leading to the injury and your current symptoms will be helpful in diagnosing head injuries. Many times, pictures of the brain, such as CT or MRI are needed to see the extent of the injury. Often, an overnight hospital stay is necessary for observation.  WHEN SHOULD I SEEK IMMEDIATE MEDICAL CARE?  You should get help right away if:  You have confusion or drowsiness.  You feel sick to your stomach (nauseous) or have continued, forceful vomiting.  You have dizziness or unsteadiness that is getting  worse.  You have severe, continued headaches not relieved by medicine. Only take over-the-counter or prescription medicines for pain, fever, or discomfort as directed by your health care provider.  You do not have normal function of the arms or legs or are unable to walk.  You notice changes in the black spots in the center of the colored part of your eye (pupil).  You have a clear or bloody fluid coming from your nose or ears.  You have a loss of vision. During the next 24 hours after the injury, you must stay with someone who can watch you for the warning signs. This person should contact local emergency services (911 in the [place] if you have seizures, you become unconscious, or you are unable to wake up. HOW CAN I PREVENT A HEAD INJURY IN THE FUTURE? The most important factor for preventing major head injuries is avoiding motor vehicle accidents. To minimize the potential for damage to your head, it is crucial to wear seat belts  while riding in motor vehicles. Wearing helmets while bike riding and playing collision sports (like football) is also helpful. Also, avoiding dangerous activities around the house will further help reduce your risk of head injury.  WHEN CAN I RETURN TO NORMAL ACTIVITIES AND ATHLETICS? You should be reevaluated by your health care provider before returning to these activities. If you have any of the following symptoms, you should not return to activities or contact sports until 1 week after the symptoms have stopped:  Persistent headache.  Dizziness or vertigo.  Poor attention and concentration.  Confusion.  Memory problems.  Nausea or vomiting.  Fatigue or tire easily.  Irritability.  Intolerant of bright lights or loud noises.  Anxiety or depression.  Disturbed sleep. MAKE SURE YOU:   Understand these instructions.  Will watch your condition.  Will get help right away if you are not doing well or get worse. Document Released: 05/26/2005  Document Revised: 05/31/2013 Document Reviewed: 01/31/2013 [facility] Patient Information 2015 [place], [place]. This information is not intended to replace advice given to you by your health care provider. Make sure you discuss any questions you have with your health care provider.  Contusion A contusion is a deep bruise. Contusions are the result of an injury that caused bleeding under the skin. The contusion may turn blue, purple, or yellow. Minor injuries will give you a painless contusion, but more severe contusions may stay painful and swollen for a few weeks.  CAUSES  A contusion is usually caused by a blow, trauma, or direct force to an area of the body. SYMPTOMS   Swelling and redness of the injured area.  Bruising of the injured area.  Tenderness and soreness of the injured area.  Pain. DIAGNOSIS  The diagnosis can be made by taking a history and physical exam. An X-ray, CT scan, or MRI may be needed to determine if there were any associated injuries, such as fractures. TREATMENT  Specific treatment will depend on what area of the body was injured. In general, the best treatment for a contusion is resting, icing, elevating, and applying cold compresses to the injured area. Over-the-counter medicines may also be recommended for pain control. Ask your caregiver what the best treatment is for your contusion. HOME CARE INSTRUCTIONS   Put ice on the injured area.  Put ice in a plastic bag.  Place a towel between your skin and the bag.  Leave the ice on for 15-20 minutes, 3-4 times a day, or as directed by your health care provider.  Only take over-the-counter or prescription medicines for pain, discomfort, or fever as directed by your caregiver. Your caregiver may recommend avoiding anti-inflammatory medicines (aspirin, ibuprofen, and naproxen) for 48 hours because these medicines may increase bruising.  Rest the injured area.  If possible, elevate the injured area to reduce  swelling. SEEK IMMEDIATE MEDICAL CARE IF:   You have increased bruising or swelling.  You have pain that is getting worse.  Your swelling or pain is not relieved with medicines. MAKE SURE YOU:   Understand these instructions.  Will watch your condition.  Will get help right away if you are not doing well or get worse. Document Released: 03/05/2005 Document Revised: 05/31/2013 Document Reviewed: 03/31/2011 [facility] Patient Information 2015 [place], [place]. This information is not intended to replace advice given to you by your health care provider. Make sure you discuss any questions you have with your health care provider.   RICE: Routine Care for Injuries The routine care of  many injuries includes Rest, Ice, Compression, and Elevation (RICE). HOME CARE INSTRUCTIONS  Rest is needed to allow your body to heal. Routine activities can usually be resumed when comfortable. Injured tendons and bones can take up to 6 weeks to heal. Tendons are the cord-like structures that attach muscle to bone.  Ice following an injury helps keep the swelling down and reduces pain.  Put ice in a plastic bag.  Place a towel between your skin and the bag.  Leave the ice on for 15-20 minutes, 3-4 times a day, or as directed by your health care provider. Do this while awake, for the first 24 to 48 hours. After that, continue as directed by your caregiver.  Compression helps keep swelling down. It also gives support and helps with discomfort. If an elastic bandage has been applied, it should be removed and reapplied every 3 to 4 hours. It should not be applied tightly, but firmly enough to keep swelling down. Watch fingers or toes for swelling, bluish discoloration, coldness, numbness, or excessive pain. If any of these problems occur, remove the bandage and reapply loosely. Contact your caregiver if these problems continue.  Elevation helps reduce swelling and decreases pain. With extremities, such as the  arms, hands, legs, and feet, the injured area should be placed near or above the level of the heart, if possible. SEEK IMMEDIATE MEDICAL CARE IF:  You have persistent pain and swelling.  You develop redness, numbness, or unexpected weakness.  Your symptoms are getting worse rather than improving after several days. These symptoms may indicate that further evaluation or further X-rays are needed. Sometimes, X-rays may not show a small broken bone (fracture) until 1 week or 10 days later. Make a follow-up appointment with your caregiver. Ask when your X-ray results will be ready. Make sure you get your X-ray results. Document Released: 09/07/2000 Document Revised: 05/31/2013 Document Reviewed: 10/25/2010 [facility] Patient Information 2015 [place], [place]. This information is not intended to replace advice given to you by your health care provider. Make sure you discuss any questions you have with your health care provider.

## 2014-10-13 NOTE — ED Notes (Signed)
Patient transported to CT and xray 

## 2014-10-13 NOTE — ED Provider Notes (Signed)
TIME SEEN: 11:45 AM  CHIEF COMPLAINT: Fall, back pain, possible head injury  HPI: Pt is a 59 y.o. female with history of hypertension, dyslipidemia, COPD, DVT currently on Coumadin who presents to the emergency department after she slipped and fell on her stairs and fell onto her back and may have struck her head. Denies numbness, tingling or focal weakness. She reports that she has soreness in her lower extremities. No bowel or bladder incontinence. No preceding symptoms such as chest pain, shortness of breath or palpitations that led to her fall. Able to ambulate after the fall. Ambulates normally with a cane.  ROS: See HPI Constitutional: no fever  Eyes: no drainage  ENT: no runny nose   Cardiovascular:  no chest pain  Resp: no SOB  GI: no vomiting GU: no dysuria Integumentary: no rash  Allergy: no hives  Musculoskeletal: no leg swelling  Neurological: no slurred speech ROS otherwise negative  PAST MEDICAL HISTORY/PAST SURGICAL HISTORY:  Past Medical History  Diagnosis Date  . Hypertension   . Asthma   . COPD (chronic obstructive pulmonary disease)   . Anxiety   . Cancer   . Colon cancer   . Dyslipidemia   . Migraine headache   . Chronic bronchitis   . Uterine fibroid   . Ovarian cyst   . GERD (gastroesophageal reflux disease)   . Morbid obesity   . Obstructive sleep apnea     mild  . Chronic pain   . Chronic back pain   . Arthritis   . Osteoarthritis   . Depression   . DVT (deep vein thrombosis) in pregnancy     MEDICATIONS:  Prior to Admission medications   Medication Sig Start Date End Date Taking? Authorizing Provider  acetaminophen (TYLENOL) 500 MG tablet Take 500 mg by mouth every 6 (six) hours as needed for headache.    Historical Provider, MD  acyclovir (ZOVIRAX) 400 MG tablet Take 2 tablets (800 mg total) by mouth 5 (five) times daily. Patient not taking: Reported on 08/09/2014 07/18/14   Britt Bottom, NP  albuterol (PROVENTIL HFA;VENTOLIN HFA) 108  (90 BASE) MCG/ACT inhaler Inhale 2 puffs into the lungs every 6 (six) hours as needed for wheezing or shortness of breath (shortness of breath). 02/20/14   Oswald Hillock, MD  B Complex Vitamins (VITAMIN-B COMPLEX PO) Take 1 tablet by mouth daily.    Historical Provider, MD  cetirizine (ZYRTEC) 10 MG tablet Take 10 mg by mouth daily as needed for allergies.    Historical Provider, MD  clindamycin (CLEOCIN) 150 MG capsule Take 1 capsule (150 mg total) by mouth every 6 (six) hours. Patient not taking: Reported on 09/23/2014 08/09/14   Lacretia Leigh, MD  cyclobenzaprine (FLEXERIL) 10 MG tablet Take 1 tablet (10 mg total) by mouth 2 (two) times daily as needed for muscle spasms. Patient not taking: Reported on 09/23/2014 06/15/14   Evelina Bucy, MD  fluticasone [facility] HFA) 110 MCG/ACT inhaler Inhale 2 puffs into the lungs every 12 (twelve) hours. Patient not taking: Reported on 09/23/2014 01/17/14   Lorayne Marek, MD  furosemide (LASIX) 20 MG tablet Take 1 tablet (20 mg total) by mouth daily. 08/25/14   Lorayne Marek, MD  HYDROcodone-acetaminophen (HYCET) 7.5-325 mg/15 ml solution Take 10-15 mLs by mouth every 4 (four) hours as needed for moderate pain. Patient not taking: Reported on 08/09/2014 06/25/14   Jerrell Belfast, MD  HYDROcodone-acetaminophen (NORCO/VICODIN) 5-325 MG per tablet Take 1 tablet by mouth every 4 (four) hours as needed  for moderate pain or severe pain. Patient not taking: Reported on 08/09/2014 07/18/14   Britt Bottom, NP  LORazepam (ATIVAN) 1 MG tablet Take 1 tablet (1 mg total) by mouth every 6 (six) hours as needed for anxiety (anxiety). 08/25/14   Lorayne Marek, MD  omeprazole (PRILOSEC) 40 MG capsule Take 1 capsule (40 mg total) by mouth daily. 08/25/14   Lorayne Marek, MD  oxyCODONE-acetaminophen (PERCOCET) 5-325 MG per tablet Take 1 tablet by mouth every 6 (six) hours as needed for moderate pain. 09/23/14   Pamella Pert, MD  oxyCODONE-acetaminophen (PERCOCET/ROXICET) 5-325 MG per  tablet Take 1-2 tablets by mouth every 4 (four) hours as needed for severe pain. Patient not taking: Reported on 09/23/2014 08/09/14   Lacretia Leigh, MD  potassium chloride SA (K-DUR,KLOR-CON) 20 MEQ tablet Take 1 tablet (20 mEq total) by mouth daily. Patient not taking: Reported on 08/09/2014 06/20/14   Lorayne Marek, MD  potassium chloride SA (K-DUR,KLOR-CON) 20 MEQ tablet Take 1 tablet (20 mEq total) by mouth daily. 08/25/14   Lorayne Marek, MD  traZODone (DESYREL) 50 MG tablet Take 1 tablet (50 mg total) by mouth at bedtime. Patient taking differently: Take 50 mg by mouth at bedtime as needed for sleep.  01/17/14   Lorayne Marek, MD  Vitamin D, Ergocalciferol, (DRISDOL) 50000 UNITS CAPS capsule Take 50,000 Units by mouth every 7 (seven) days. Monday    Historical Provider, MD  warfarin (COUMADIN) 5 MG tablet Take 1 tablet (5 mg total) by mouth daily. 08/25/14   Lorayne Marek, MD    ALLERGIES:  Allergies  Allergen Reactions  . Kiwi Extract Anaphylaxis, Hives and Swelling    Makes her swell all over   . Aspirin Nausea Only    Can take as long as enteric coated    SOCIAL HISTORY:  History  Substance Use Topics  . Smoking status: Current Some Day Smoker -- 1.00 packs/day for 37 years    Types: Cigarettes  . Smokeless tobacco: Never Used     Comment: trying to quit, down to .5ppd X2 years ago.   . Alcohol Use: No    FAMILY HISTORY: Family History  Problem Relation Age of Onset  . Uterine cancer Mother   . Stroke Father   . Colon cancer Maternal Uncle   . Diabetes Father   . Ovarian cancer Mother   . Hypertension Father   . Breast cancer Maternal Grandmother   . Diabetes Sister   . Asthma Child   . Asthma Child     EXAM: BP 113/77 mmHg  Pulse 95  Temp(Src) 98.4 F (36.9 C) (Oral)  Resp 16  Ht 4\' 11"  (1.499 m)  Wt 241 lb (109.317 kg)  BMI 48.65 kg/m2  SpO2 99%  LMP 05/24/2003 CONSTITUTIONAL: Alert and oriented and responds appropriately to questions. Well-appearing;  well-nourished; GCS 15 HEAD: Normocephalic; atraumatic EYES: Conjunctivae clear, PERRL, EOMI ENT: normal nose; no rhinorrhea; moist mucous membranes; pharynx without lesions noted; no dental injury; no septal hematoma NECK: Supple, no meningismus, no LAD; tender to palpation over the lower cervical spine without step-off or deformity CARD: RRR; S1 and S2 appreciated; no murmurs, no clicks, no rubs, no gallops RESP: Normal chest excursion without splinting or tachypnea; breath sounds clear and equal bilaterally; no wheezes, no rhonchi, no rales; no hypoxia or respiratory distress CHEST:  chest wall stable, no crepitus or ecchymosis or deformity, nontender to palpation ABD/GI: Normal bowel sounds; non-distended; soft, non-tender, no rebound, no guarding PELVIS:  stable, nontender to palpation  BACK:  The back appears normal and is non-tender to palpation, there is no CVA tenderness; tender to palpation diffusely throughout the thoracic and lumbar spine without step-off or deformity EXT: Normal ROM in all joints; non-tender to palpation; no edema; normal capillary refill; no cyanosis, no bony tenderness or bony deformity of patient's extremities, no joint effusion, no ecchymosis or lacerations    SKIN: Normal color for age and race; warm NEURO: Moves all extremities equally, sensation to light touch intact diffusely, cranial nerves II through XII intact PSYCH: The patient's mood and manner are appropriate. Grooming and personal hygiene are appropriate.  MEDICAL DECISION MAKING: Patient here with mechanical fall. CT of her head and cervical spine show no acute injury. X-rays of her thoracic and lumbar spine are also negative for acute injury. Pain improved with Percocet. We'll discharge with prescription for the same. Discussed return precautions and discussed importance of outpatient follow-up. She verbalized standing and is comfortable with plan.       Julia[place], DO 10/13/14 1711

## 2014-10-13 NOTE — ED Notes (Signed)
Pt reports missing a step this morning and falling down 5 steps, denies LOC, reports lower back pain that radiates down into bilateral legs, pt denies hitting head but does report she takes Coumadin.

## 2014-10-18 ENCOUNTER — Emergency Department (INDEPENDENT_AMBULATORY_CARE_PROVIDER_SITE_OTHER)
Admission: EM | Admit: 2014-10-18 | Discharge: 2014-10-18 | Disposition: A | Discharge status: Home / Self Care | Payer: Commercial Managed Care - HMO | Source: Home / Self Care | Attending: Emergency Medicine | Admitting: Emergency Medicine

## 2014-10-18 ENCOUNTER — Encounter (HOSPITAL_COMMUNITY): Payer: Self-pay | Admitting: *Deleted

## 2014-10-18 DIAGNOSIS — M5441 Lumbago with sciatica, right side: Secondary | ICD-10-CM

## 2014-10-18 MED ORDER — PREDNISONE 50 MG PO TABS: ORAL_TABLET | ORAL | Status: DC

## 2014-10-18 MED ORDER — GABAPENTIN 300 MG PO CAPS: 300.0000 mg | ORAL_CAPSULE | Freq: Every day | ORAL | Status: DC

## 2014-10-18 MED ORDER — MORPHINE SULFATE 2 MG/ML IJ SOLN
2.0000 mg | Freq: Once | INTRAMUSCULAR | Status: AC
  Administered 2014-10-18: 2 mg via INTRAMUSCULAR

## 2014-10-18 MED ORDER — MORPHINE SULFATE 2 MG/ML IJ SOLN
INTRAMUSCULAR | Status: AC
  Filled 2014-10-18: qty 1

## 2014-10-18 MED ORDER — METHYLPREDNISOLONE SODIUM SUCC 125 MG IJ SOLR: 125.0000 mg | Freq: Once | INTRAMUSCULAR | Status: DC

## 2014-10-18 MED ORDER — OXYCODONE-ACETAMINOPHEN 5-325 MG PO TABS: 1.0000 | ORAL_TABLET | Freq: Four times a day (QID) | ORAL | Status: DC | PRN

## 2014-10-18 NOTE — Discharge Instructions (Signed)
Your sciatic nerve is irritated after the fall. Take prednisone 1 pill daily for 5 days. Take gabapentin 1 pill at bedtime for 2 weeks. This medicine will make you sleepy. Use the Percocet every 4-6 hours as needed for severe pain. You should see some improvement in the next 2-3 days. This will take several weeks to fully resolve. Follow-up as needed.

## 2014-10-18 NOTE — ED Provider Notes (Signed)
CSN: [id number]     Arrival date & time 10/18/14  1113 History   First MD Initiated Contact with Patient 10/18/14 1259     Chief Complaint  Patient presents with  . Fall   (Consider location/radiation/quality/duration/timing/severity/associated sxs/prior Treatment) HPI She is a 59 year old woman here for right leg pain. She states she fell at home last week. She was seen in the emergency room at that time. Her workup was negative. She was discharged home with Percocet. She states the Percocet did help with the pain. Over the last few days she has developed pain that goes from her right lower back and shoots down her right leg to her knee. She also reports some intermittent numbness and tingling of the right leg. She denies any weakness, will feel like the leg is going to give out when the pain happens. No bowel or bladder incontinence. She is unable to take NSAIDs as she is on Coumadin.  Past Medical History  Diagnosis Date  . Hypertension   . Asthma   . COPD (chronic obstructive pulmonary disease)   . Anxiety   . Cancer   . Colon cancer   . Dyslipidemia   . Migraine headache   . Chronic bronchitis   . Uterine fibroid   . Ovarian cyst   . GERD (gastroesophageal reflux disease)   . Morbid obesity   . Obstructive sleep apnea     mild  . Chronic pain   . Chronic back pain   . Arthritis   . Osteoarthritis   . Depression   . DVT (deep vein thrombosis) in pregnancy    Past Surgical History  Procedure Laterality Date  . Knee arthroscopy      bil.  . Carpal tunnel release      rt  . Mouth surgery      teeth extraction  . Cesarean section    . Subtotal colectomy    . Colonoscopy    . Tubal ligation     Family History  Problem Relation Age of Onset  . Uterine cancer Mother   . Stroke Father   . Colon cancer Maternal Uncle   . Diabetes Father   . Ovarian cancer Mother   . Hypertension Father   . Breast cancer Maternal Grandmother   . Diabetes Sister   . Asthma Child    . Asthma Child    History  Substance Use Topics  . Smoking status: Current Some Day Smoker -- 1.00 packs/day for 37 years    Types: Cigarettes  . Smokeless tobacco: Never Used     Comment: trying to quit, down to .5ppd X2 years ago.   . Alcohol Use: No   OB History    Gravida Para Term Preterm AB TAB SAB Ectopic Multiple Living   2 2 2       2      Review of Systems As in history of present illness Allergies  Kiwi extract and Aspirin  Home Medications   Prior to Admission medications   Medication Sig Start Date End Date Taking? Authorizing Provider  acetaminophen (TYLENOL) 500 MG tablet Take 500 mg by mouth every 6 (six) hours as needed for headache.    Historical Provider, MD  albuterol (PROVENTIL HFA;VENTOLIN HFA) 108 (90 BASE) MCG/ACT inhaler Inhale 2 puffs into the lungs every 6 (six) hours as needed for wheezing or shortness of breath (shortness of breath). 02/20/14   Oswald Hillock, MD  B Complex Vitamins (VITAMIN-B COMPLEX PO) Take 1 tablet  by mouth daily.    Historical Provider, MD  cetirizine (ZYRTEC) 10 MG tablet Take 10 mg by mouth daily as needed for allergies.    Historical Provider, MD  furosemide (LASIX) 20 MG tablet Take 1 tablet (20 mg total) by mouth daily. 08/25/14   Lorayne Marek, MD  gabapentin (NEURONTIN) 300 MG capsule Take 1 capsule (300 mg total) by mouth at bedtime. 10/18/14   Melony Overly, MD  LORazepam (ATIVAN) 1 MG tablet Take 1 tablet (1 mg total) by mouth every 6 (six) hours as needed for anxiety (anxiety). 08/25/14   Lorayne Marek, MD  omeprazole (PRILOSEC) 40 MG capsule Take 1 capsule (40 mg total) by mouth daily. 08/25/14   Lorayne Marek, MD  oxyCODONE-acetaminophen (PERCOCET/ROXICET) 5-325 MG per tablet Take 1-2 tablets by mouth every 6 (six) hours as needed. 10/18/14   Melony Overly, MD  potassium chloride SA (K-DUR,KLOR-CON) 20 MEQ tablet Take 1 tablet (20 mEq total) by mouth daily. 08/25/14   Lorayne Marek, MD  predniSONE (DELTASONE) 50 MG tablet Take 1  pill daily for 5 days. 10/18/14   Melony Overly, MD  traZODone (DESYREL) 50 MG tablet Take 1 tablet (50 mg total) by mouth at bedtime. Patient taking differently: Take 50 mg by mouth at bedtime as needed for sleep.  01/17/14   Lorayne Marek, MD  Vitamin D, Ergocalciferol, (DRISDOL) 50000 UNITS CAPS capsule Take 50,000 Units by mouth every 7 (seven) days. Monday    Historical Provider, MD  warfarin (COUMADIN) 5 MG tablet Take 1 tablet (5 mg total) by mouth daily. 08/25/14   Lorayne Marek, MD   BP 139/88 mmHg  Pulse 82  Temp(Src) 98.4 F (36.9 C) (Oral)  Resp 16  SpO2 95%  LMP 05/24/2003 Physical Exam  Constitutional: She is oriented to person, place, and time. She appears well-developed and well-nourished. She appears distressed.  Cardiovascular: Normal rate.   Pulmonary/Chest: Effort normal.  Musculoskeletal:  Back: Limited exam. Positive seated straight leg raise on the right.  Neurological: She is alert and oriented to person, place, and time.    ED Course  Procedures (including critical care time) Labs Review Labs Reviewed - No data to display  Imaging Review No results found.   MDM   1. Right-sided low back pain with right-sided sciatica    Morphine 2 mg IM given for pain.  We'll discharge with prednisone, gabapentin, Percocet. Follow-up as needed.  Melony Overly, MD 10/18/14 1351

## 2014-10-18 NOTE — ED Notes (Signed)
Pt  Reports    She  Fell      1   Week   Ago   Was   Seen  Er        Continues  To  Have   Pain  Pt  Reports pain  Radiates            Down  The   r leg    She  Ambulated  To  Room  With a  Slow   Gait

## 2014-10-26 ENCOUNTER — Encounter (HOSPITAL_BASED_OUTPATIENT_CLINIC_OR_DEPARTMENT_OTHER): Payer: Self-pay | Admitting: *Deleted

## 2014-10-26 ENCOUNTER — Emergency Department (HOSPITAL_BASED_OUTPATIENT_CLINIC_OR_DEPARTMENT_OTHER): Payer: Commercial Managed Care - HMO

## 2014-10-26 ENCOUNTER — Emergency Department (HOSPITAL_BASED_OUTPATIENT_CLINIC_OR_DEPARTMENT_OTHER)
Admission: EM | Admit: 2014-10-26 | Discharge: 2014-10-26 | Disposition: A | Discharge status: Home / Self Care | Payer: Commercial Managed Care - HMO | Attending: Emergency Medicine | Admitting: Emergency Medicine

## 2014-10-26 DIAGNOSIS — M1990 Unspecified osteoarthritis, unspecified site: Secondary | ICD-10-CM | POA: Insufficient documentation

## 2014-10-26 DIAGNOSIS — Z720 Tobacco use: Secondary | ICD-10-CM | POA: Diagnosis not present

## 2014-10-26 DIAGNOSIS — I10 Essential (primary) hypertension: Secondary | ICD-10-CM | POA: Diagnosis not present

## 2014-10-26 DIAGNOSIS — Z7901 Long term (current) use of anticoagulants: Secondary | ICD-10-CM | POA: Diagnosis not present

## 2014-10-26 DIAGNOSIS — M5430 Sciatica, unspecified side: Secondary | ICD-10-CM | POA: Insufficient documentation

## 2014-10-26 DIAGNOSIS — Z79899 Other long term (current) drug therapy: Secondary | ICD-10-CM | POA: Insufficient documentation

## 2014-10-26 DIAGNOSIS — G8929 Other chronic pain: Secondary | ICD-10-CM | POA: Diagnosis not present

## 2014-10-26 DIAGNOSIS — G43909 Migraine, unspecified, not intractable, without status migrainosus: Secondary | ICD-10-CM | POA: Diagnosis not present

## 2014-10-26 DIAGNOSIS — Z8742 Personal history of other diseases of the female genital tract: Secondary | ICD-10-CM | POA: Diagnosis not present

## 2014-10-26 DIAGNOSIS — M545 Low back pain, unspecified: Secondary | ICD-10-CM

## 2014-10-26 DIAGNOSIS — J449 Chronic obstructive pulmonary disease, unspecified: Secondary | ICD-10-CM | POA: Insufficient documentation

## 2014-10-26 DIAGNOSIS — Z86718 Personal history of other venous thrombosis and embolism: Secondary | ICD-10-CM | POA: Diagnosis not present

## 2014-10-26 DIAGNOSIS — Z85038 Personal history of other malignant neoplasm of large intestine: Secondary | ICD-10-CM | POA: Insufficient documentation

## 2014-10-26 DIAGNOSIS — F329 Major depressive disorder, single episode, unspecified: Secondary | ICD-10-CM | POA: Insufficient documentation

## 2014-10-26 DIAGNOSIS — F419 Anxiety disorder, unspecified: Secondary | ICD-10-CM | POA: Diagnosis not present

## 2014-10-26 MED ORDER — HYDROMORPHONE HCL 1 MG/ML IJ SOLN
1.0000 mg | Freq: Once | INTRAMUSCULAR | Status: AC
  Administered 2014-10-26: 1 mg via INTRAMUSCULAR
  Filled 2014-10-26: qty 1

## 2014-10-26 MED ORDER — CYCLOBENZAPRINE HCL 10 MG PO TABS
10.0000 mg | ORAL_TABLET | Freq: Once | ORAL | Status: AC
  Administered 2014-10-26: 10 mg via ORAL
  Filled 2014-10-26: qty 1

## 2014-10-26 MED ORDER — CYCLOBENZAPRINE HCL 10 MG PO TABS: 10.0000 mg | ORAL_TABLET | Freq: Two times a day (BID) | ORAL | Status: DC | PRN

## 2014-10-26 MED ORDER — IBUPROFEN 800 MG PO TABS: 800.0000 mg | ORAL_TABLET | Freq: Once | ORAL | Status: DC

## 2014-10-26 MED ORDER — OXYCODONE-ACETAMINOPHEN 5-325 MG PO TABS: 1.0000 | ORAL_TABLET | Freq: Four times a day (QID) | ORAL | Status: DC | PRN

## 2014-10-26 NOTE — ED Notes (Signed)
C/o low back pain and bil leg pain to just past knees. States she fell 3 weeks ago. She fell on carpeted steps.

## 2014-10-26 NOTE — Discharge Instructions (Signed)
Back Exercises Back exercises help treat and prevent back injuries. The goal of back exercises is to increase the strength of your abdominal and back muscles and the flexibility of your back. These exercises should be started when you no longer have back pain. Back exercises include:  Pelvic Tilt. Lie on your back with your knees bent. Tilt your pelvis until the lower part of your back is against the floor. Hold this position 5 to 10 sec and repeat 5 to 10 times.  Knee to Chest. Pull first 1 knee up against your chest and hold for 20 to 30 seconds, repeat this with the other knee, and then both knees. This may be done with the other leg straight or bent, whichever feels better.  Sit-Ups or Curl-Ups. Bend your knees 90 degrees. Start with tilting your pelvis, and do a partial, slow sit-up, lifting your trunk only 30 to 45 degrees off the floor. Take at least 2 to 3 seconds for each sit-up. Do not do sit-ups with your knees out straight. If partial sit-ups are difficult, simply do the above but with only tightening your abdominal muscles and holding it as directed.  Hip-Lift. Lie on your back with your knees flexed 90 degrees. Push down with your feet and shoulders as you raise your hips a couple inches off the floor; hold for 10 seconds, repeat 5 to 10 times.  Back arches. Lie on your stomach, propping yourself up on bent elbows. Slowly press on your hands, causing an arch in your low back. Repeat 3 to 5 times. Any initial stiffness and discomfort should lessen with repetition over time.  Shoulder-Lifts. Lie face down with arms beside your body. Keep hips and torso pressed to floor as you slowly lift your head and shoulders off the floor. Do not overdo your exercises, especially in the beginning. Exercises may cause you some mild back discomfort which lasts for a few minutes; however, if the pain is more severe, or lasts for more than 15 minutes, do not continue exercises until you see your caregiver.  Improvement with exercise therapy for back problems is slow.  See your caregivers for assistance with developing a proper back exercise program. Document Released: 07/03/2004 Document Revised: 08/18/2011 Document Reviewed: 03/27/2011 Exit[employer] Patient Information 2015 [employer]. This information is not intended to replace advice given to you by your health care provider. Make sure you discuss any questions you have with your health care provider.   Sciatica Sciatica is pain, weakness, numbness, or tingling along the path of the sciatic nerve. The nerve starts in the lower back and runs down the back of each leg. The nerve controls the muscles in the lower leg and in the back of the knee, while also providing sensation to the back of the thigh, lower leg, and the sole of your foot. Sciatica is a symptom of another medical condition. For instance, nerve damage or certain conditions, such as a herniated disk or bone spur on the spine, pinch or put pressure on the sciatic nerve. This causes the pain, weakness, or other sensations normally associated with sciatica. Generally, sciatica only affects one side of the body. CAUSES   Herniated or slipped disc.  Degenerative disk disease.  A pain disorder involving the narrow muscle in the buttocks (piriformis syndrome).  Pelvic injury or fracture.  Pregnancy.  Tumor (rare). SYMPTOMS  Symptoms can vary from mild to very severe. The symptoms usually travel from the low back to the buttocks and down the back   of the leg. Symptoms can include:  Mild tingling or dull aches in the lower back, leg, or hip.  Numbness in the back of the calf or sole of the foot.  Burning sensations in the lower back, leg, or hip.  Sharp pains in the lower back, leg, or hip.  Leg weakness.  Severe back pain inhibiting movement. These symptoms may get worse with coughing, sneezing, laughing, or prolonged sitting or standing. Also, being overweight may worsen  symptoms. DIAGNOSIS  Your caregiver will perform a physical exam to look for common symptoms of sciatica. He or she may ask you to do certain movements or activities that would trigger sciatic nerve pain. Other tests may be performed to find the cause of the sciatica. These may include:  Blood tests.  X-rays.  Imaging tests, such as an MRI or CT scan. TREATMENT  Treatment is directed at the cause of the sciatic pain. Sometimes, treatment is not necessary and the pain and discomfort goes away on its own. If treatment is needed, your caregiver may suggest:  Over-the-counter medicines to relieve pain.  Prescription medicines, such as anti-inflammatory medicine, muscle relaxants, or narcotics.  Applying heat or ice to the painful area.  Steroid injections to lessen pain, irritation, and inflammation around the nerve.  Reducing activity during periods of pain.  Exercising and stretching to strengthen your abdomen and improve flexibility of your spine. Your caregiver may suggest losing weight if the extra weight makes the back pain worse.  Physical therapy.  Surgery to eliminate what is pressing or pinching the nerve, such as a bone spur or part of a herniated disk. HOME CARE INSTRUCTIONS   Only take over-the-counter or prescription medicines for pain or discomfort as directed by your caregiver.  Apply ice to the affected area for 20 minutes, 3-4 times a day for the first 48-72 hours. Then try heat in the same way.  Exercise, stretch, or perform your usual activities if these do not aggravate your pain.  Attend physical therapy sessions as directed by your caregiver.  Keep all follow-up appointments as directed by your caregiver.  Do not wear high heels or shoes that do not provide proper support.  Check your mattress to see if it is too soft. A firm mattress may lessen your pain and discomfort. SEEK IMMEDIATE MEDICAL CARE IF:   You lose control of your bowel or bladder  (incontinence).  You have increasing weakness in the lower back, pelvis, buttocks, or legs.  You have redness or swelling of your back.  You have a burning sensation when you urinate.  You have pain that gets worse when you lie down or awakens you at night.  Your pain is worse than you have experienced in the past.  Your pain is lasting longer than 4 weeks.  You are suddenly losing weight without reason. MAKE SURE YOU:  Understand these instructions.  Will watch your condition.  Will get help right away if you are not doing well or get worse. Document Released: 05/20/2001 Document Revised: 11/25/2011 Document Reviewed: 10/05/2011 [employer] Patient Information 2015 [employer]. This information is not intended to replace advice given to you by your health care provider. Make sure you discuss any questions you have with your health care provider.  

## 2014-10-26 NOTE — ED Provider Notes (Signed)
CSN: [id number]     Arrival date & time 10/26/14  0813 History   First MD Initiated Contact with Patient 10/26/14 [phone]     No chief complaint on file.    (Consider location/radiation/quality/duration/timing/severity/associated sxs/prior Treatment) Patient is a 59 y.o. female presenting with back pain. The history is provided by the patient.  Back Pain Location:  Lumbar spine Quality:  Aching Radiates to:  L posterior upper leg and R posterior upper leg Pain severity:  Moderate Onset quality:  Gradual Duration:  2 weeks Timing:  Constant Progression:  Worsening Chronicity:  New Context: falling (2 weeks ago fell on carpeted steps)   Relieved by:  Being still and narcotics Worsened by:  Ambulation, bending and movement Associated symptoms: no fever     Past Medical History  Diagnosis Date  . Hypertension   . Asthma   . COPD (chronic obstructive pulmonary disease)   . Anxiety   . Cancer   . Colon cancer   . Dyslipidemia   . Migraine headache   . Chronic bronchitis   . Uterine fibroid   . Ovarian cyst   . GERD (gastroesophageal reflux disease)   . Morbid obesity   . Obstructive sleep apnea     mild  . Chronic pain   . Chronic back pain   . Arthritis   . Osteoarthritis   . Depression   . DVT (deep vein thrombosis) in pregnancy    Past Surgical History  Procedure Laterality Date  . Knee arthroscopy      bil.  . Carpal tunnel release      rt  . Mouth surgery      teeth extraction  . Cesarean section    . Subtotal colectomy    . Colonoscopy    . Tubal ligation     Family History  Problem Relation Age of Onset  . Uterine cancer Mother   . Stroke Father   . Colon cancer Maternal Uncle   . Diabetes Father   . Ovarian cancer Mother   . Hypertension Father   . Breast cancer Maternal Grandmother   . Diabetes Sister   . Asthma Child   . Asthma Child    History  Substance Use Topics  . Smoking status: Current Some Day Smoker -- 1.00 packs/day for 37 years     Types: Cigarettes  . Smokeless tobacco: Never Used     Comment: trying to quit, down to .5ppd X2 years ago.   . Alcohol Use: No   OB History    Gravida Para Term Preterm AB TAB SAB Ectopic Multiple Living   2 2 2       2      Review of Systems  Constitutional: Negative for fever.  Respiratory: Negative for cough and shortness of breath.   Genitourinary: Negative for difficulty urinating.       Urinary incontinence - denies Saddle anesthesia - denies  Musculoskeletal: Positive for back pain.  All other systems reviewed and are negative.     Allergies  Kiwi extract and Aspirin  Home Medications   Prior to Admission medications   Medication Sig Start Date End Date Taking? Authorizing Provider  acetaminophen (TYLENOL) 500 MG tablet Take 500 mg by mouth every 6 (six) hours as needed for headache.   Yes Historical Provider, MD  albuterol (PROVENTIL HFA;VENTOLIN HFA) 108 (90 BASE) MCG/ACT inhaler Inhale 2 puffs into the lungs every 6 (six) hours as needed for wheezing or shortness of breath (shortness of breath).  02/20/14  Yes Oswald Hillock, MD  B Complex Vitamins (VITAMIN-B COMPLEX PO) Take 1 tablet by mouth daily.   Yes Historical Provider, MD  cetirizine (ZYRTEC) 10 MG tablet Take 10 mg by mouth daily as needed for allergies.   Yes Historical Provider, MD  furosemide (LASIX) 20 MG tablet Take 1 tablet (20 mg total) by mouth daily. 08/25/14  Yes Lorayne Marek, MD  gabapentin (NEURONTIN) 300 MG capsule Take 1 capsule (300 mg total) by mouth at bedtime. 10/18/14  Yes Melony Overly, MD  LORazepam (ATIVAN) 1 MG tablet Take 1 tablet (1 mg total) by mouth every 6 (six) hours as needed for anxiety (anxiety). 08/25/14  Yes Lorayne Marek, MD  omeprazole (PRILOSEC) 40 MG capsule Take 1 capsule (40 mg total) by mouth daily. 08/25/14  Yes Lorayne Marek, MD  potassium chloride SA (K-DUR,KLOR-CON) 20 MEQ tablet Take 1 tablet (20 mEq total) by mouth daily. 08/25/14  Yes Lorayne Marek, MD  predniSONE  (DELTASONE) 50 MG tablet Take 1 pill daily for 5 days. 10/18/14  Yes Melony Overly, MD  traZODone (DESYREL) 50 MG tablet Take 1 tablet (50 mg total) by mouth at bedtime. Patient taking differently: Take 50 mg by mouth at bedtime as needed for sleep.  01/17/14  Yes Lorayne Marek, MD  Vitamin D, Ergocalciferol, (DRISDOL) 50000 UNITS CAPS capsule Take 50,000 Units by mouth every 7 (seven) days. Monday   Yes Historical Provider, MD  warfarin (COUMADIN) 5 MG tablet Take 1 tablet (5 mg total) by mouth daily. 08/25/14  Yes Deepak Advani, MD   BP 124/87 mmHg  Pulse 74  Temp(Src) 98.1 F (36.7 C) (Oral)  Resp 20  Ht 4\' 11"  (1.499 m)  Wt 231 lb (104.781 kg)  BMI 46.63 kg/m2  SpO2 98%  LMP 05/24/2003 Physical Exam  Constitutional: She is oriented to person, place, and time. She appears well-developed and well-nourished. No distress.  HENT:  Head: Normocephalic and atraumatic.  Mouth/Throat: Oropharynx is clear and moist.  Eyes: EOM are normal. Pupils are equal, round, and reactive to light.  Neck: Normal range of motion. Neck supple.  Cardiovascular: Normal rate and regular rhythm.  Exam reveals no friction rub.   No murmur heard. Pulmonary/Chest: Effort normal and breath sounds normal. No respiratory distress. She has no wheezes. She has no rales.  Abdominal: Soft. She exhibits no distension. There is no tenderness. There is no rebound.  Musculoskeletal: Normal range of motion. She exhibits no edema.       Right shoulder: She exhibits bony tenderness (central lumbar spine). She exhibits no deformity and no laceration.  Neurological: She is alert and oriented to person, place, and time.  Skin: She is not diaphoretic.  Nursing note and vitals reviewed.   ED Course  Procedures (including critical care time) Labs Review Labs Reviewed - No data to display  Imaging Review Ct Lumbar Spine Wo Contrast  10/26/2014   CLINICAL DATA:  Acute onset lumbago/low back pain radiating down both legs.  EXAM:  CT LUMBAR SPINE WITHOUT CONTRAST  TECHNIQUE: Multidetector CT imaging of the lumbar spine was performed without intravenous contrast administration. Multiplanar CT image reconstructions were also generated.  COMPARISON:  10/13/2014.  FINDINGS: No acute lumbar compression fractures present. There are 5 lumbar type vertebral bodies. The alignment shows grade I retrolisthesis of L5 on S1 measuring 3 mm secondary to collapse of the L5-S1 disc. Grade I anterolisthesis of L4 on L5 is present also measuring 3 mm. At L4-L5, the anterolisthesis is  degenerative and facet mediated. Trace retrolisthesis of L3 on L4 and L2 on L3 is also present. Paraspinal soft tissues demonstrate atherosclerosis. Exophytic cystic lesion in the inferior LEFT renal pole is compatible with a renal cyst. Bilateral sacroiliac joint osteoarthritis with subchondral sclerosis. No displaced sacral fracture or iliac bone fracture.  T11-T12: Vacuum disc. Mild symmetric bilateral foraminal stenosis. Trace retrolisthesis. RIGHT facet arthrosis.  T12-L1:  Negative.  L1-L2:  Negative.  L2-L3: Disc degeneration and loss of height. Central canal and foramina appear patent.  L3-L4: Bilateral severe facet arthrosis. Subchondral cysts are present bilaterally. Central canal appears patent. Disc degeneration with circumferential disc bulging. Mild bilateral foraminal stenosis associated with facet arthrosis, loss of disc height and disc bulging.  L4-L5: Severe bilateral facet arthrosis with bilateral vacuum joint. There is also a cyst in the spinous process of L4, bony central stenosis is moderate and there is bilateral subarticular stenosis. Moderate to severe bilateral foraminal stenosis.  L5-S1: Severe disc degeneration. No significant central stenosis. Bilateral foraminal stenosis due to endplate spurring and facet arthrosis along with listhesis.  IMPRESSION: 1. No acute osseous abnormality. Negative for compression fracture in this patient with a recent fall.  2. Moderate lumbar spondylosis and facet arthrosis with central stenosis most pronounced at L4-L5.   Electronically Signed   By: Dereck Ligas M.D.   On: 10/26/2014 09:16     EKG Interpretation None      MDM   Final diagnoses:  Lower back pain    59 year old female with history of colon cancer presents with lower back pain. She fell 2 weeks ago. She is having pain radiating to the back of both legs. Worse with movement. She was seen 1 week ago that time was having right-sided pain, now seems to be progressing to left and right-sided pain. She was getting better with Percocet. Here she has exquisite lower back tenderness and difficulty with moving her legs to the lower back pain. Reflex exam is difficult due to obesity, however she has no clonus. She denies any bowel or bladder incontinence. She denies any saddle anesthesia. She's had prior imaging of her lower back to reschedule it was negative. Will proceed with CT scan to ensure no occult fracture.  CT negative. Feeling better with pain meds. Instructed to f/u with PCP and Physical Therapy. Given percocet and flexeril.  Evelina Bucy, MD 10/26/14 [phone]

## 2014-10-26 NOTE — ED Notes (Signed)
PO fluids provided. 

## 2014-11-13 ENCOUNTER — Other Ambulatory Visit: Payer: Self-pay | Admitting: Internal Medicine

## 2014-11-14 ENCOUNTER — Encounter: Payer: Self-pay | Admitting: Physical Medicine & Rehabilitation

## 2014-11-21 ENCOUNTER — Encounter (HOSPITAL_BASED_OUTPATIENT_CLINIC_OR_DEPARTMENT_OTHER): Payer: Self-pay | Admitting: Emergency Medicine

## 2014-11-21 ENCOUNTER — Emergency Department (HOSPITAL_BASED_OUTPATIENT_CLINIC_OR_DEPARTMENT_OTHER)
Admission: EM | Admit: 2014-11-21 | Discharge: 2014-11-21 | Disposition: A | Discharge status: Home / Self Care | Payer: Medicare HMO | Attending: Emergency Medicine | Admitting: Emergency Medicine

## 2014-11-21 DIAGNOSIS — F419 Anxiety disorder, unspecified: Secondary | ICD-10-CM | POA: Diagnosis not present

## 2014-11-21 DIAGNOSIS — M1990 Unspecified osteoarthritis, unspecified site: Secondary | ICD-10-CM | POA: Diagnosis not present

## 2014-11-21 DIAGNOSIS — Z859 Personal history of malignant neoplasm, unspecified: Secondary | ICD-10-CM | POA: Diagnosis not present

## 2014-11-21 DIAGNOSIS — Z7952 Long term (current) use of systemic steroids: Secondary | ICD-10-CM | POA: Diagnosis not present

## 2014-11-21 DIAGNOSIS — Z8742 Personal history of other diseases of the female genital tract: Secondary | ICD-10-CM | POA: Insufficient documentation

## 2014-11-21 DIAGNOSIS — K219 Gastro-esophageal reflux disease without esophagitis: Secondary | ICD-10-CM | POA: Diagnosis not present

## 2014-11-21 DIAGNOSIS — Z720 Tobacco use: Secondary | ICD-10-CM | POA: Insufficient documentation

## 2014-11-21 DIAGNOSIS — M545 Low back pain: Secondary | ICD-10-CM

## 2014-11-21 DIAGNOSIS — J449 Chronic obstructive pulmonary disease, unspecified: Secondary | ICD-10-CM | POA: Diagnosis not present

## 2014-11-21 DIAGNOSIS — I10 Essential (primary) hypertension: Secondary | ICD-10-CM | POA: Insufficient documentation

## 2014-11-21 DIAGNOSIS — F329 Major depressive disorder, single episode, unspecified: Secondary | ICD-10-CM | POA: Insufficient documentation

## 2014-11-21 DIAGNOSIS — Z8669 Personal history of other diseases of the nervous system and sense organs: Secondary | ICD-10-CM | POA: Diagnosis not present

## 2014-11-21 DIAGNOSIS — Z7901 Long term (current) use of anticoagulants: Secondary | ICD-10-CM | POA: Diagnosis not present

## 2014-11-21 DIAGNOSIS — R05 Cough: Secondary | ICD-10-CM | POA: Diagnosis not present

## 2014-11-21 DIAGNOSIS — E785 Hyperlipidemia, unspecified: Secondary | ICD-10-CM | POA: Insufficient documentation

## 2014-11-21 DIAGNOSIS — G8929 Other chronic pain: Secondary | ICD-10-CM | POA: Diagnosis not present

## 2014-11-21 DIAGNOSIS — Z86018 Personal history of other benign neoplasm: Secondary | ICD-10-CM | POA: Insufficient documentation

## 2014-11-21 DIAGNOSIS — Z85038 Personal history of other malignant neoplasm of large intestine: Secondary | ICD-10-CM | POA: Insufficient documentation

## 2014-11-21 DIAGNOSIS — Z79899 Other long term (current) drug therapy: Secondary | ICD-10-CM | POA: Insufficient documentation

## 2014-11-21 DIAGNOSIS — G43909 Migraine, unspecified, not intractable, without status migrainosus: Secondary | ICD-10-CM | POA: Insufficient documentation

## 2014-11-21 LAB — I-STAT CHEM 8, ED
BUN: 8 mg/dL (ref 6–20)
CALCIUM ION: 1.16 mmol/L (ref 1.12–1.23)
CHLORIDE: 103 mmol/L (ref 101–111)
CREATININE: 0.8 mg/dL (ref 0.44–1.00)
Glucose, Bld: 88 mg/dL (ref 65–99)
Potassium: 4.1 mmol/L (ref 3.5–5.1)
Sodium: 140 mmol/L (ref 135–145)
TCO2: 23 mmol/L (ref 0–100)

## 2014-11-21 LAB — PROTIME-INR
INR: 1.34 (ref 0.00–1.49)
Prothrombin Time: 16.7 seconds — ABNORMAL HIGH (ref 11.6–15.2)

## 2014-11-21 MED ORDER — KETOROLAC TROMETHAMINE 30 MG/ML IJ SOLN: 30.0000 mg | Freq: Once | INTRAMUSCULAR | Status: DC

## 2014-11-21 MED ORDER — HYDROCODONE-ACETAMINOPHEN 5-325 MG PO TABS: 1.0000 | ORAL_TABLET | Freq: Four times a day (QID) | ORAL | Status: DC | PRN

## 2014-11-21 MED ORDER — OXYCODONE-ACETAMINOPHEN 5-325 MG PO TABS
1.0000 | ORAL_TABLET | Freq: Once | ORAL | Status: AC
  Administered 2014-11-21: 1 via ORAL
  Filled 2014-11-21: qty 1

## 2014-11-21 MED ORDER — KETOROLAC TROMETHAMINE 60 MG/2ML IM SOLN
60.0000 mg | Freq: Once | INTRAMUSCULAR | Status: AC
  Administered 2014-11-21: 60 mg via INTRAMUSCULAR
  Filled 2014-11-21: qty 2

## 2014-11-21 NOTE — ED Provider Notes (Signed)
CSN: [id number]     Arrival date & time 11/21/14  1026 History   First MD Initiated Contact with Patient 11/21/14 1235     Chief Complaint  Patient presents with  . Back Pain     (Consider location/radiation/quality/duration/timing/severity/associated sxs/prior Treatment) Patient is a 59 y.o. female presenting with back pain. The history is provided by the patient.  Back Pain Location:  Lumbar spine Radiates to:  R posterior upper leg and L posterior upper leg Pain severity:  Severe Onset quality:  Sudden Timing:  Intermittent Chronicity:  Chronic Associated symptoms: no abdominal pain, no chest pain, no fever, no numbness and no weakness     Ms. Goodlin is a 59 yo F with PMH of HTN, Dyslipidemia, chronic pain syndrome, DVT, PE on coumadin that is p/w back pain.   She has been seen in the ED multiple times for the same complaint. Imaging up to this point has been negative for any fracture.  She was getting up out of bed this morning and her back locked up and she was unable to move. The pain is in her lower lumbar region and is sharp in nature. She rates the pain as a 10 out of 10 and is worse with movement. She has not tried anything to make felt better. This is different than the previous times she has had back pain. The stiffness lasted for about 5 minutes and she did not fall. She denies any urinary incontinence, saddle anesthesia, abdominal pain, or chest pain. She has shortness of breath at baseline and cough.   Past Medical History  Diagnosis Date  . Hypertension   . Asthma   . COPD (chronic obstructive pulmonary disease)   . Anxiety   . Cancer   . Colon cancer   . Dyslipidemia   . Migraine headache   . Chronic bronchitis   . Uterine fibroid   . Ovarian cyst   . GERD (gastroesophageal reflux disease)   . Morbid obesity   . Obstructive sleep apnea     mild  . Chronic pain   . Chronic back pain   . Arthritis   . Osteoarthritis   . Depression   . DVT (deep vein  thrombosis) in pregnancy    Past Surgical History  Procedure Laterality Date  . Knee arthroscopy      bil.  . Carpal tunnel release      rt  . Mouth surgery      teeth extraction  . Cesarean section    . Subtotal colectomy    . Colonoscopy    . Tubal ligation     Family History  Problem Relation Age of Onset  . Uterine cancer Mother   . Stroke Father   . Colon cancer Maternal Uncle   . Diabetes Father   . Ovarian cancer Mother   . Hypertension Father   . Breast cancer Maternal Grandmother   . Diabetes Sister   . Asthma Child   . Asthma Child    History  Substance Use Topics  . Smoking status: Current Some Day Smoker -- 1.00 packs/day for 37 years    Types: Cigarettes  . Smokeless tobacco: Never Used     Comment: trying to quit, down to .5ppd X2 years ago.   . Alcohol Use: No   OB History    Gravida Para Term Preterm AB TAB SAB Ectopic Multiple Living   2 2 2       2      Review  of Systems  Constitutional: Negative for fever.  Respiratory: Positive for cough.   Cardiovascular: Negative for chest pain.  Gastrointestinal: Negative for nausea, vomiting, abdominal pain, diarrhea and constipation.  Musculoskeletal: Positive for back pain.  Skin: Negative for rash.  Neurological: Negative for weakness and numbness.      Allergies  Kiwi extract and Aspirin  Home Medications   Prior to Admission medications   Medication Sig Start Date End Date Taking? Authorizing Provider  acetaminophen (TYLENOL) 500 MG tablet Take 500 mg by mouth every 6 (six) hours as needed for headache.    Historical Provider, MD  albuterol (PROVENTIL HFA;VENTOLIN HFA) 108 (90 BASE) MCG/ACT inhaler Inhale 2 puffs into the lungs every 6 (six) hours as needed for wheezing or shortness of breath (shortness of breath). 02/20/14   Oswald Hillock, MD  B Complex Vitamins (VITAMIN-B COMPLEX PO) Take 1 tablet by mouth daily.    Historical Provider, MD  cetirizine (ZYRTEC) 10 MG tablet Take 10 mg by mouth  daily as needed for allergies.    Historical Provider, MD  cyclobenzaprine (FLEXERIL) 10 MG tablet Take 1 tablet (10 mg total) by mouth 2 (two) times daily as needed for muscle spasms. 10/26/14   Evelina Bucy, MD  furosemide (LASIX) 20 MG tablet Take 1 tablet (20 mg total) by mouth daily. 08/25/14   Lorayne Marek, MD  gabapentin (NEURONTIN) 300 MG capsule Take 1 capsule (300 mg total) by mouth at bedtime. 10/18/14   Melony Overly, MD  HYDROcodone-acetaminophen (NORCO/VICODIN) 5-325 MG per tablet Take 1 tablet by mouth every 6 (six) hours as needed for moderate pain. 11/21/14   Rosemarie Ax, MD  LORazepam (ATIVAN) 1 MG tablet Take 1 tablet (1 mg total) by mouth every 6 (six) hours as needed for anxiety (anxiety). 08/25/14   Lorayne Marek, MD  omeprazole (PRILOSEC) 40 MG capsule Take 1 capsule (40 mg total) by mouth daily. 08/25/14   Lorayne Marek, MD  oxyCODONE-acetaminophen (PERCOCET) 5-325 MG per tablet Take 1 tablet by mouth every 6 (six) hours as needed for moderate pain. 10/26/14   Evelina Bucy, MD  potassium chloride SA (K-DUR,KLOR-CON) 20 MEQ tablet Take 1 tablet (20 mEq total) by mouth daily. 08/25/14   Lorayne Marek, MD  predniSONE (DELTASONE) 50 MG tablet Take 1 pill daily for 5 days. 10/18/14   Melony Overly, MD  traZODone (DESYREL) 50 MG tablet Take 1 tablet (50 mg total) by mouth at bedtime. Patient taking differently: Take 50 mg by mouth at bedtime as needed for sleep.  01/17/14   Lorayne Marek, MD  Vitamin D, Ergocalciferol, (DRISDOL) 50000 UNITS CAPS capsule Take 50,000 Units by mouth every 7 (seven) days. Monday    Historical Provider, MD  warfarin (COUMADIN) 5 MG tablet Take 1 tablet (5 mg total) by mouth daily. 08/25/14   Lorayne Marek, MD   BP 126/79 mmHg  Pulse 56  Temp(Src) 98.6 F (37 C) (Oral)  Resp 18  Ht 4\' 11"  (1.499 m)  Wt 231 lb (104.781 kg)  BMI 46.63 kg/m2  SpO2 98%  LMP 05/24/2003 Physical Exam  Constitutional: She is oriented to person, place, and time. She appears  well-developed and well-nourished.  HENT:  Head: Normocephalic and atraumatic.  Eyes: Conjunctivae and EOM are normal.  Neck: Normal range of motion.  Cardiovascular: Normal rate.   Pulmonary/Chest: Effort normal.  Musculoskeletal:  Normal sensation in LE b/l  Normal Plantar and dorsi flexion  5/5 strength in LE b/l  Neurovascularly intact distally  Normal hip flexion  Back: symmetric with no erythema or ecchymosis Unable to flex, patient reported since surgery on her knee  Extension reproduces pain  TTP along lumbar spine and lumbar region No Tenderness in thoracic or cervical spine  Neg straight leg raise.   Neurological: She is alert and oriented to person, place, and time.  Skin: Skin is warm. No rash noted.    ED Course  Procedures (including critical care time) Labs Review Labs Reviewed  PROTIME-INR - Abnormal; Notable for the following:    Prothrombin Time 16.7 (*)    All other components within normal limits  I-STAT CHEM 8, ED    Imaging Review No results found.   EKG Interpretation None       Medications  oxyCODONE-acetaminophen (PERCOCET/ROXICET) 5-325 MG per tablet 1 tablet (1 tablet Oral Given 11/21/14 1314)  ketorolac (TORADOL) injection 60 mg (60 mg Intramuscular Given 11/21/14 1530)     MDM   Final diagnoses:  Bilateral low back pain, with sciatica presence unspecified    Ms. Tays is a 59 yo F PMH of colon cancer that is p/w with back pain. Having radiation down posterior of both legs but neg straight leg raise, no urinary or bowel incontinence, or saddle anesthesia. Will prescribe percocet #8 and informed that she should f/u or est care if she doesn't have a primary doctor. This was emphasized as it was found that her INR was subtherapuetic.   Rosemarie Ax, MD PGY-2, [facility] 11/22/2014, 11:33 AM      Rosemarie Ax, MD 11/22/14 [place], MD 11/22/14 [phone]

## 2014-11-21 NOTE — ED Notes (Signed)
Pt having lower back pain when she got out of bed, pain was severe and radiated down legs.  Pt has history of having problems of walking and lower back pain.

## 2014-11-21 NOTE — ED Notes (Signed)
Pt amb to desk, states her pain is "much better" and that she has to go, or she won't have a ride home. Resident alerted to pt request for d/c home.
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
## 2014-11-21 NOTE — ED Notes (Signed)
Resident MD at bedside.

## 2014-11-21 NOTE — Discharge Instructions (Signed)

## 2014-12-04 IMAGING — CR DG LUMBAR SPINE COMPLETE 4+V
5 series · 5 of 5 positions shown · non-contrast
Comparison: May 12, 2012.

CLINICAL DATA: Low back pain after fall.

EXAM:
LUMBAR SPINE - COMPLETE 4+ VIEW

[t lumbar spine ap]
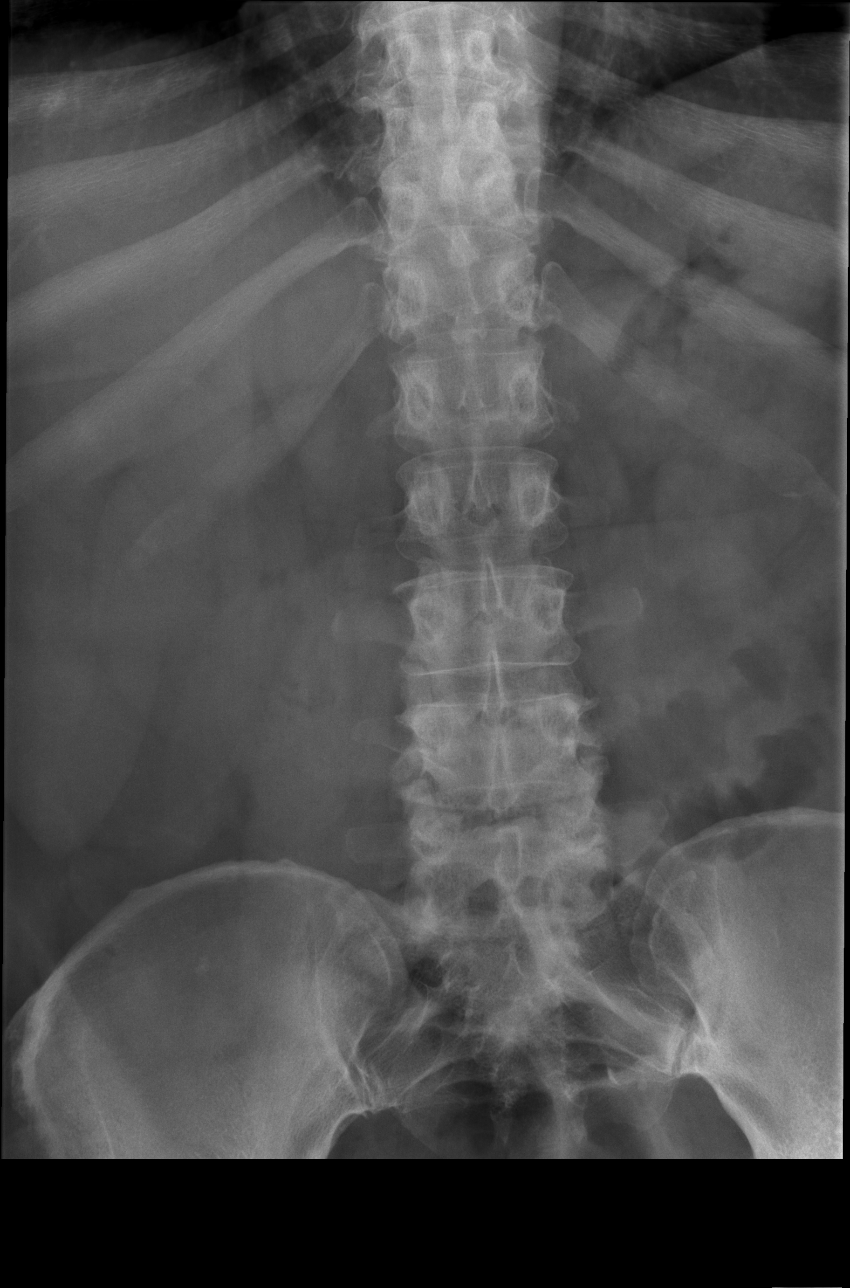

[t lumbar spine obl (1 of 2)]
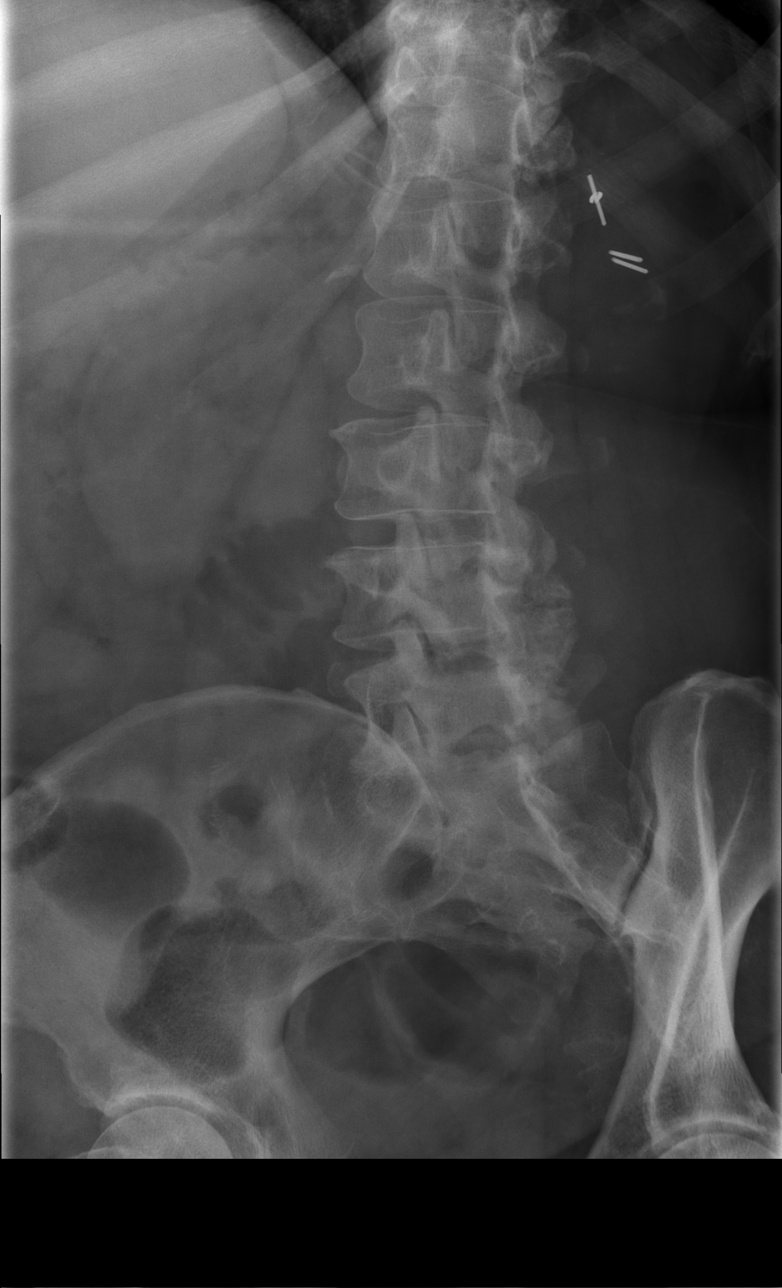

[t lumbar spine obl (2 of 2)]
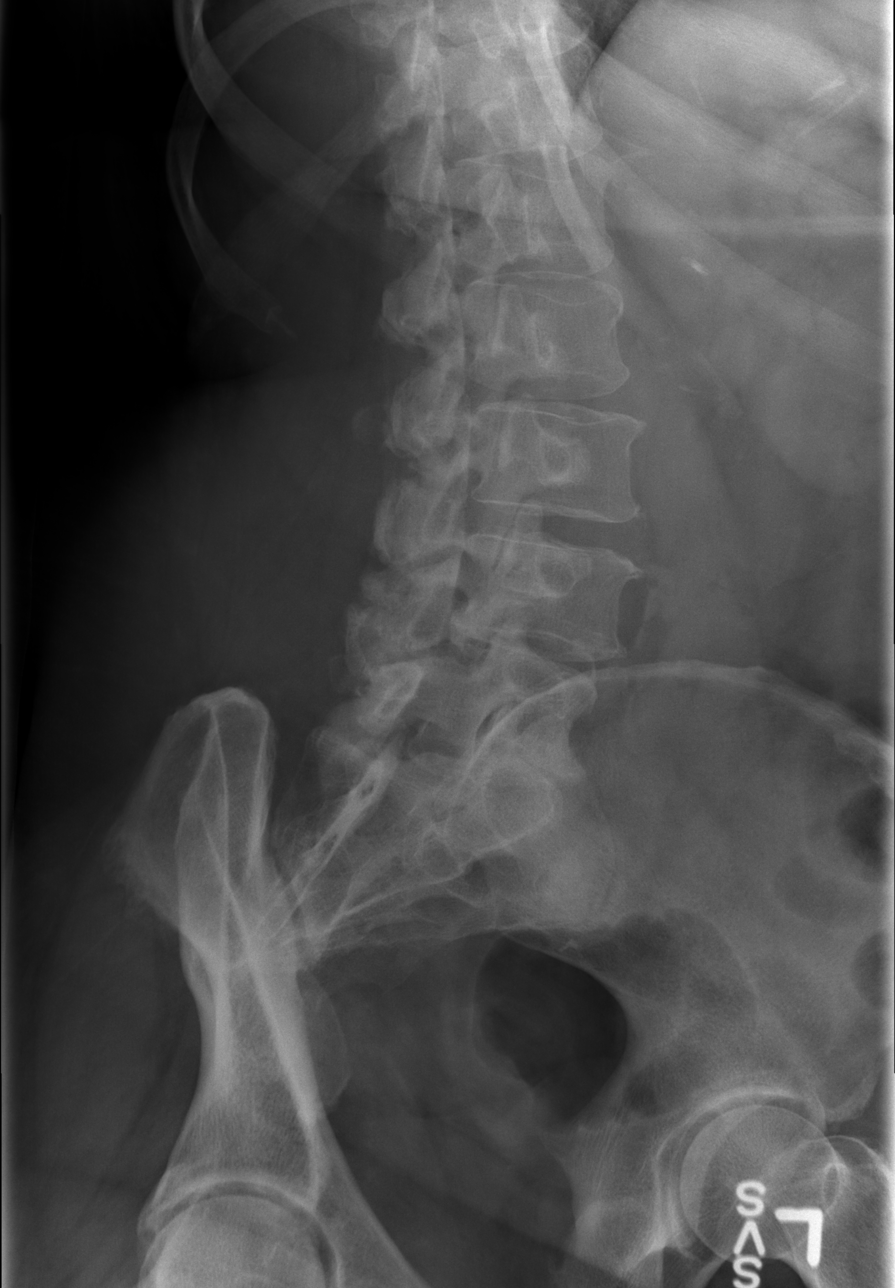

[t lumbar spine lat]
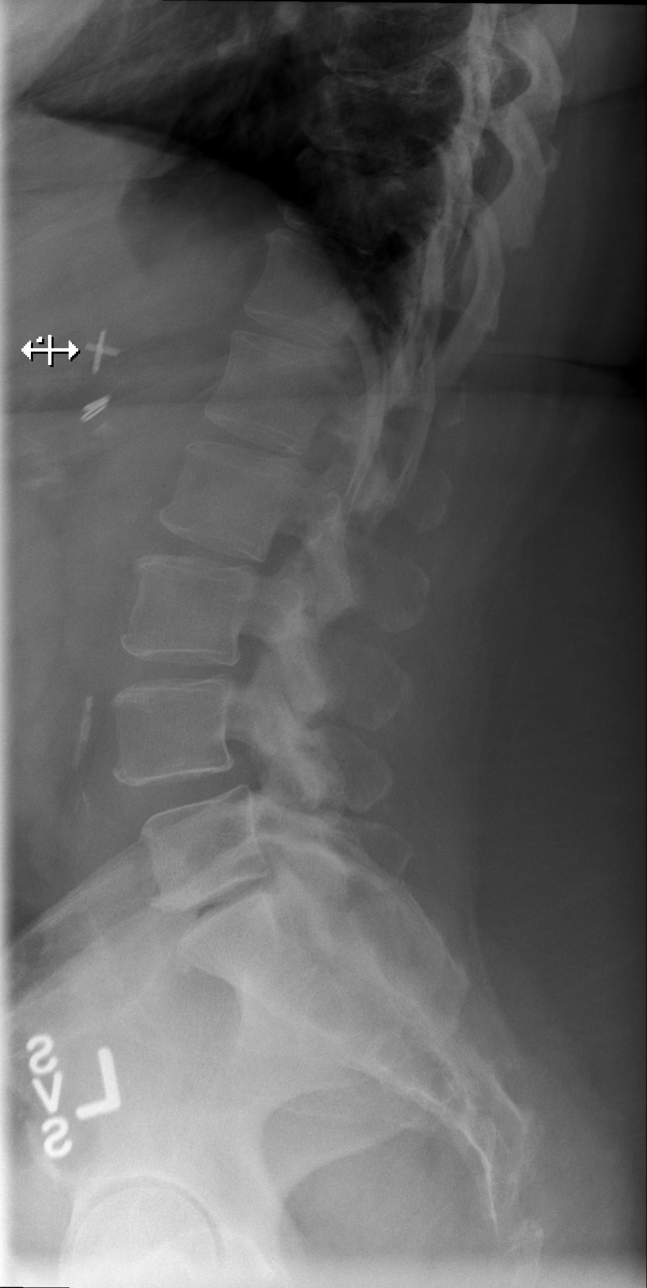

[t lumbar l-5 s-1 spot]
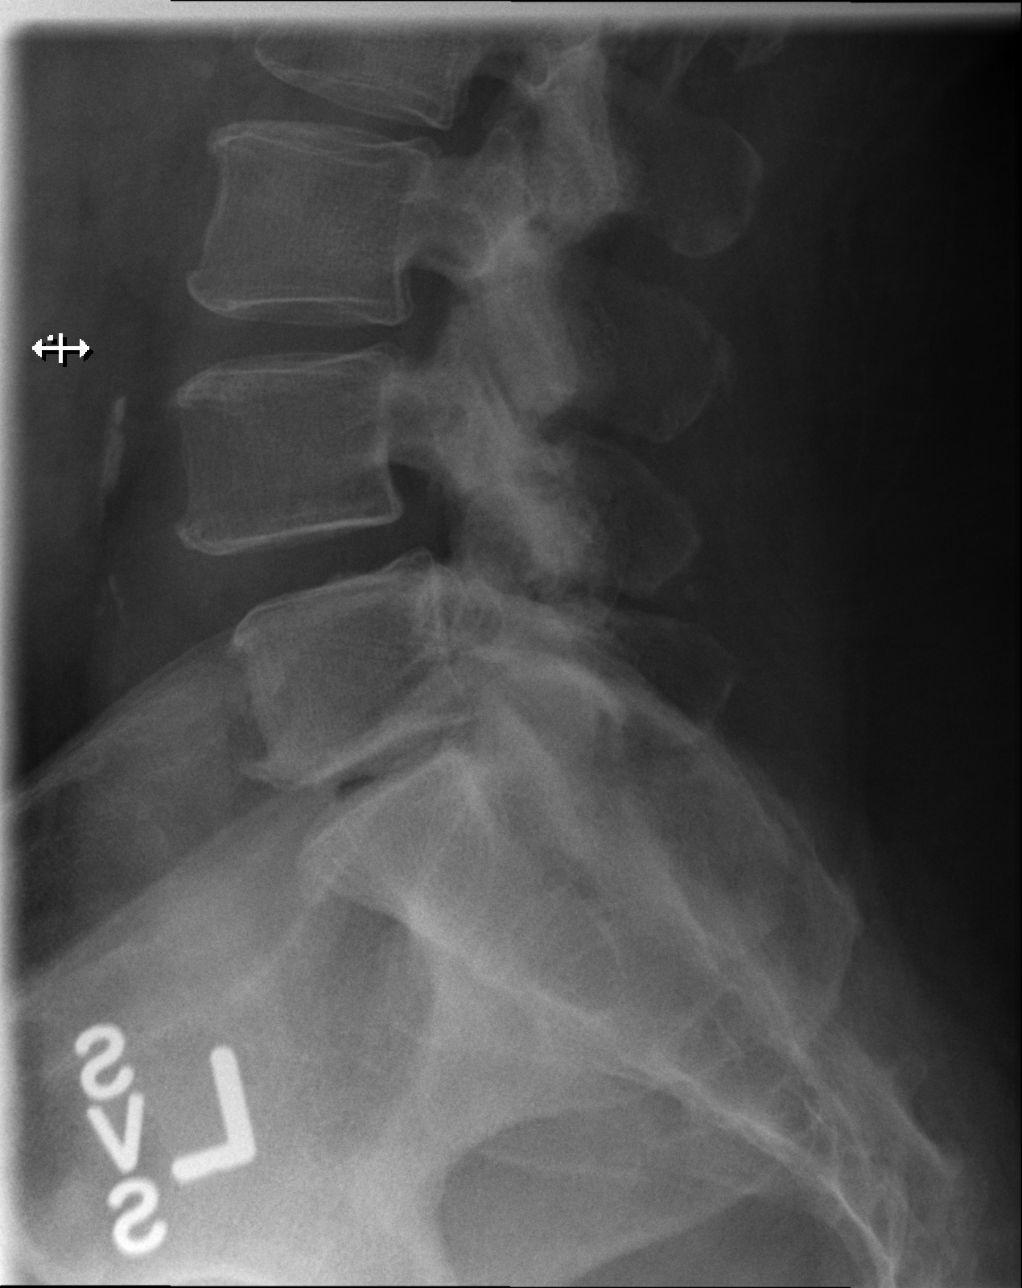

[5 of 5 positions shown; findings below may reference images not displayed]

FINDINGS: No fracture is noted. Minimal grade 1 anterolisthesis of L4-5 is
noted to 2 mild posterior facet hypertrophy. Moderate degenerative
disc disease is noted at L5-S1.
IMPRESSION: Degenerative changes as described above which are stable compared to
prior exam. No acute abnormality seen in the lumbar spine.

## 2014-12-04 IMAGING — CR DG ANKLE COMPLETE 3+V*R*
3 series · 3 of 3 positions shown · non-contrast
Comparison: None.

CLINICAL DATA: Pain post trauma

EXAM:
RIGHT ANKLE - COMPLETE 3+ VIEW

[x ankle ap right]
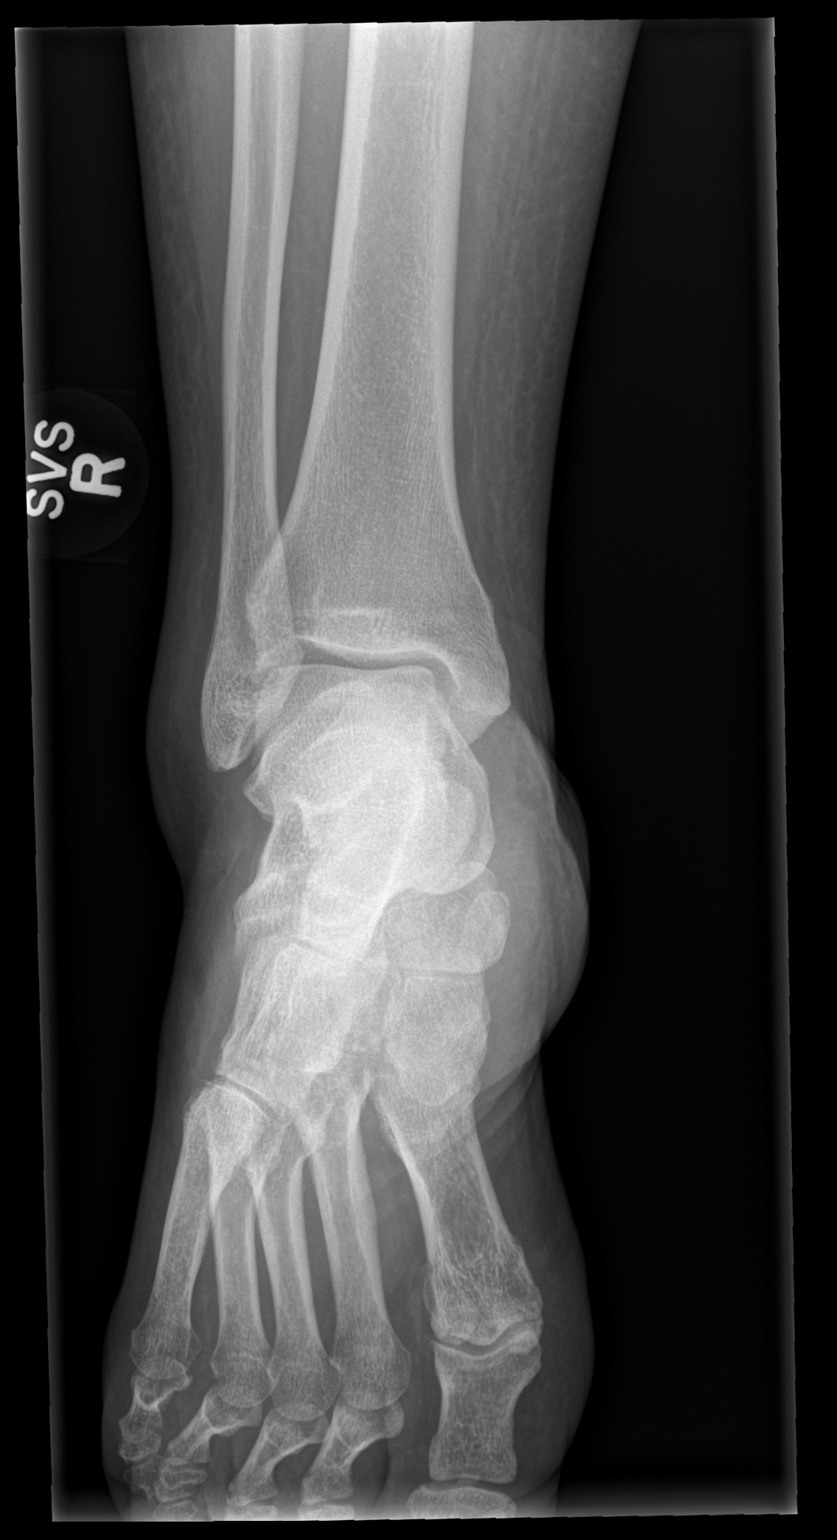

[x ankle obl right]
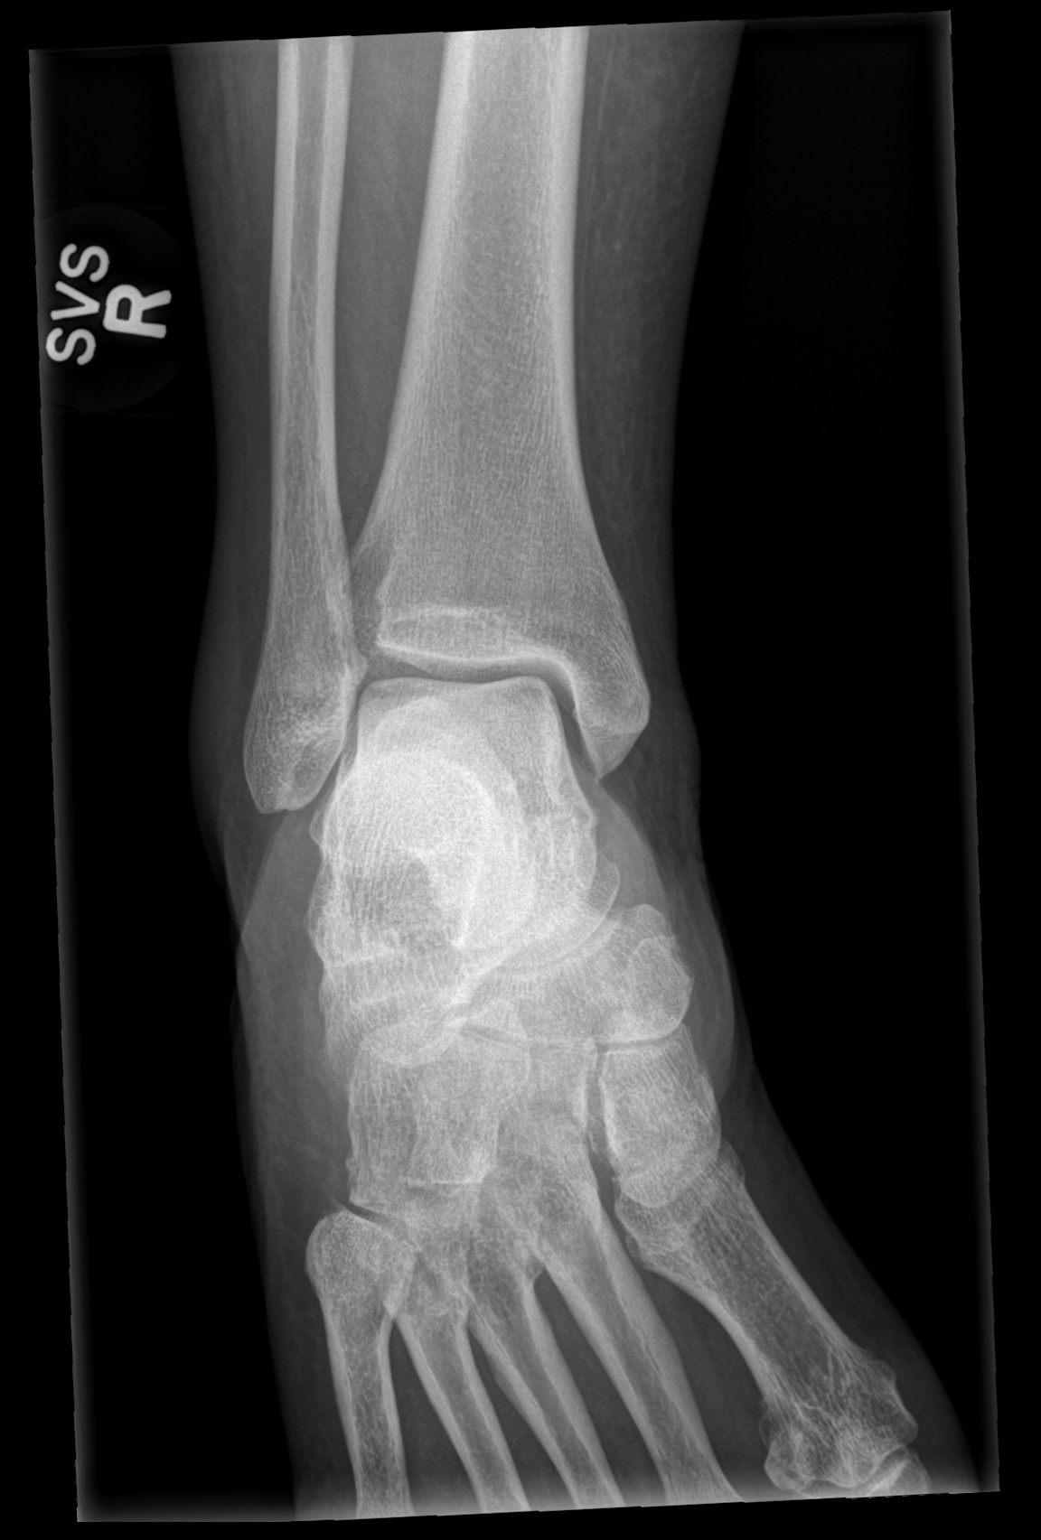

[x ankle lat right]
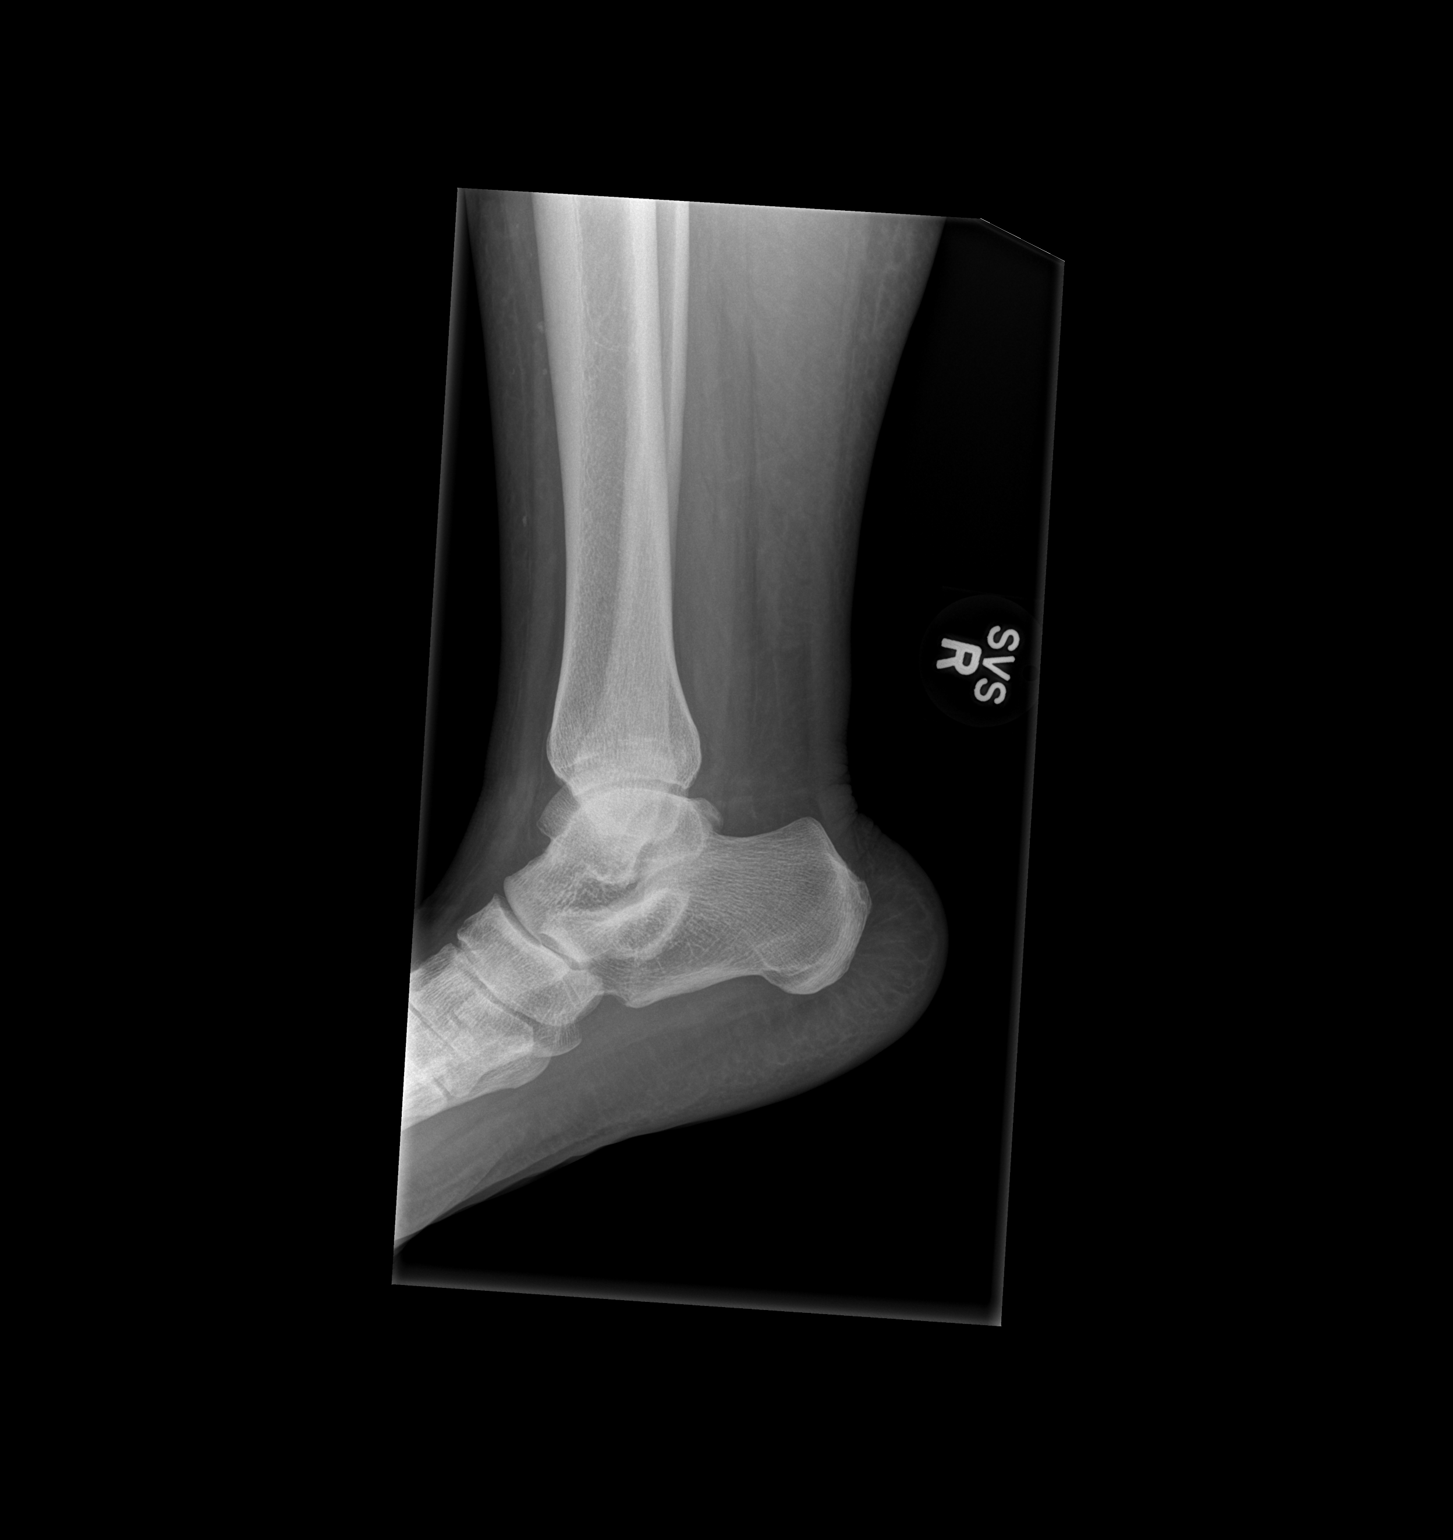

[3 of 3 positions shown; findings below may reference images not displayed]

FINDINGS: Frontal, oblique, and lateral views were obtained. There is swelling
laterally. No fracture or effusion. Ankle mortise appears intact.
IMPRESSION: Swelling laterally. No fracture. Mortise intact.

## 2014-12-04 IMAGING — CR DG KNEE COMPLETE 4+V*R*
4 series · 4 of 4 positions shown · non-contrast
Comparison: None.

CLINICAL DATA: Pain post trauma

EXAM:
RIGHT KNEE - COMPLETE 4+ VIEW

[t knee ap right]
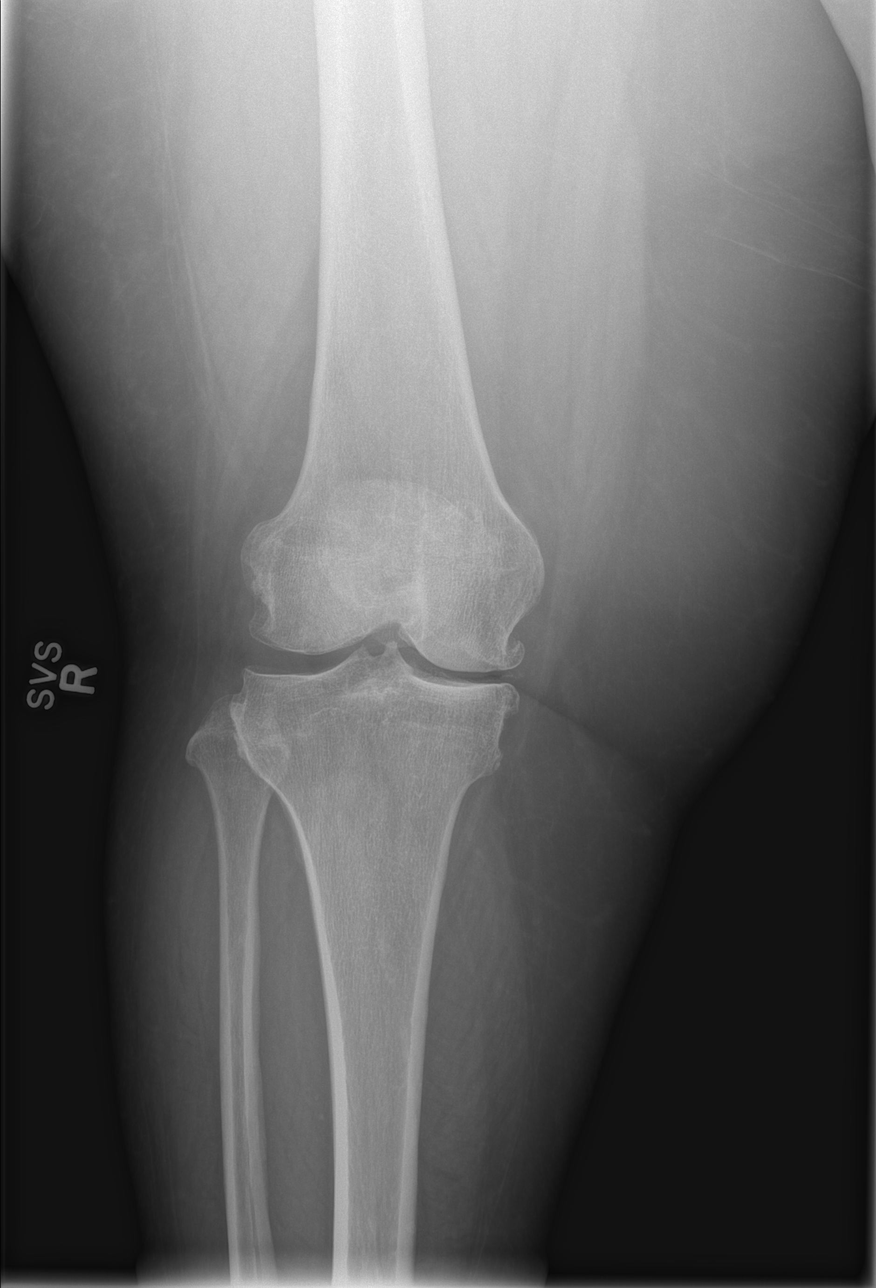

[t knee obl right (1 of 2)]
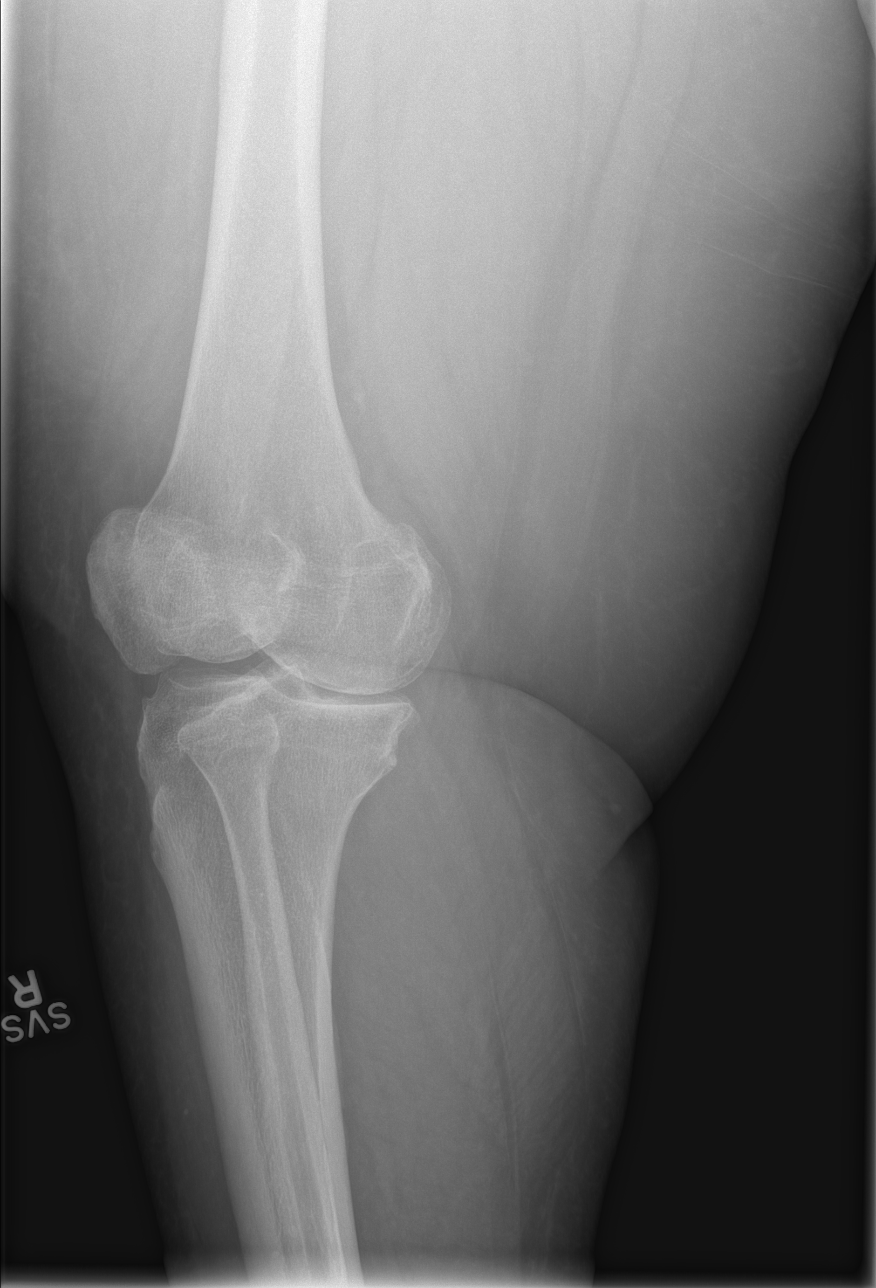

[t knee obl right (2 of 2)]
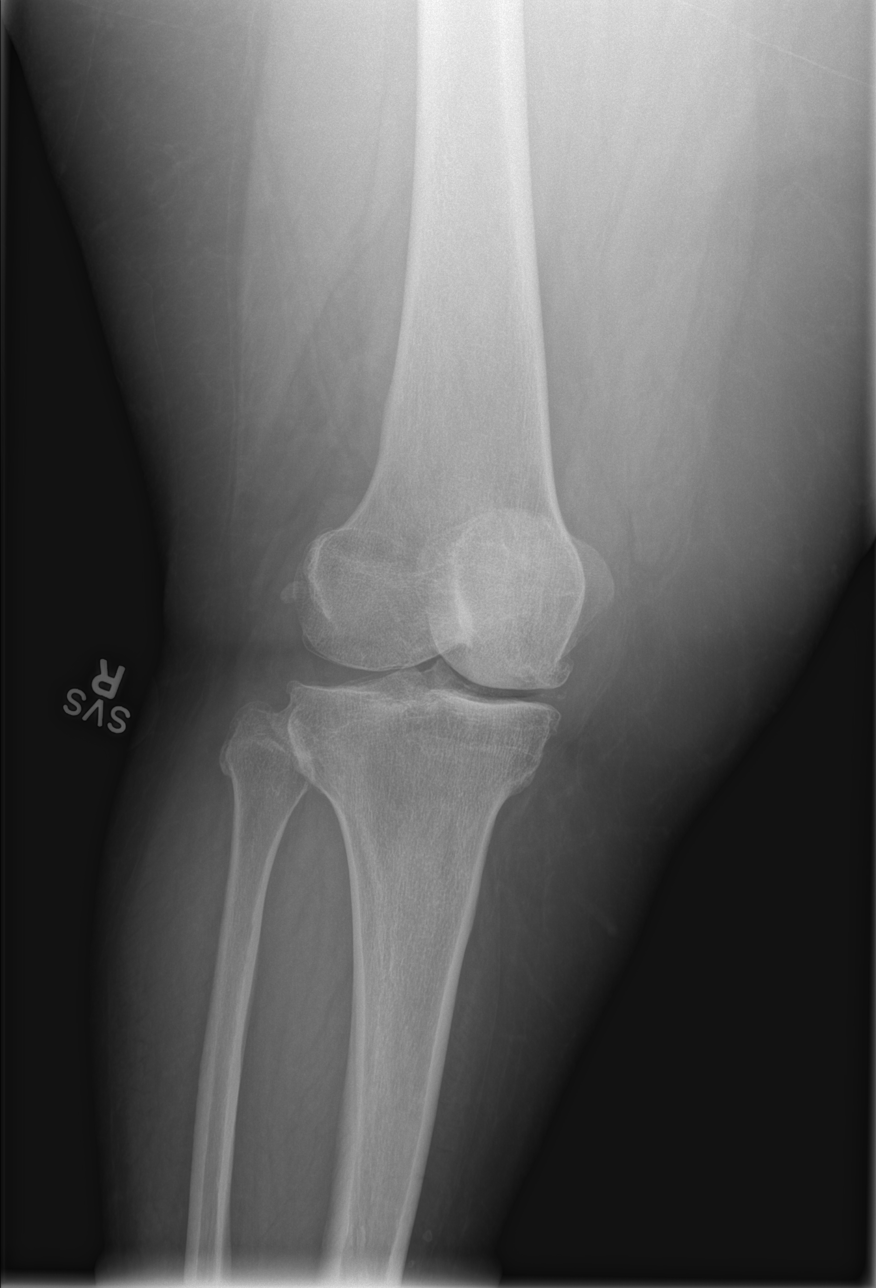

[t knee lat right]
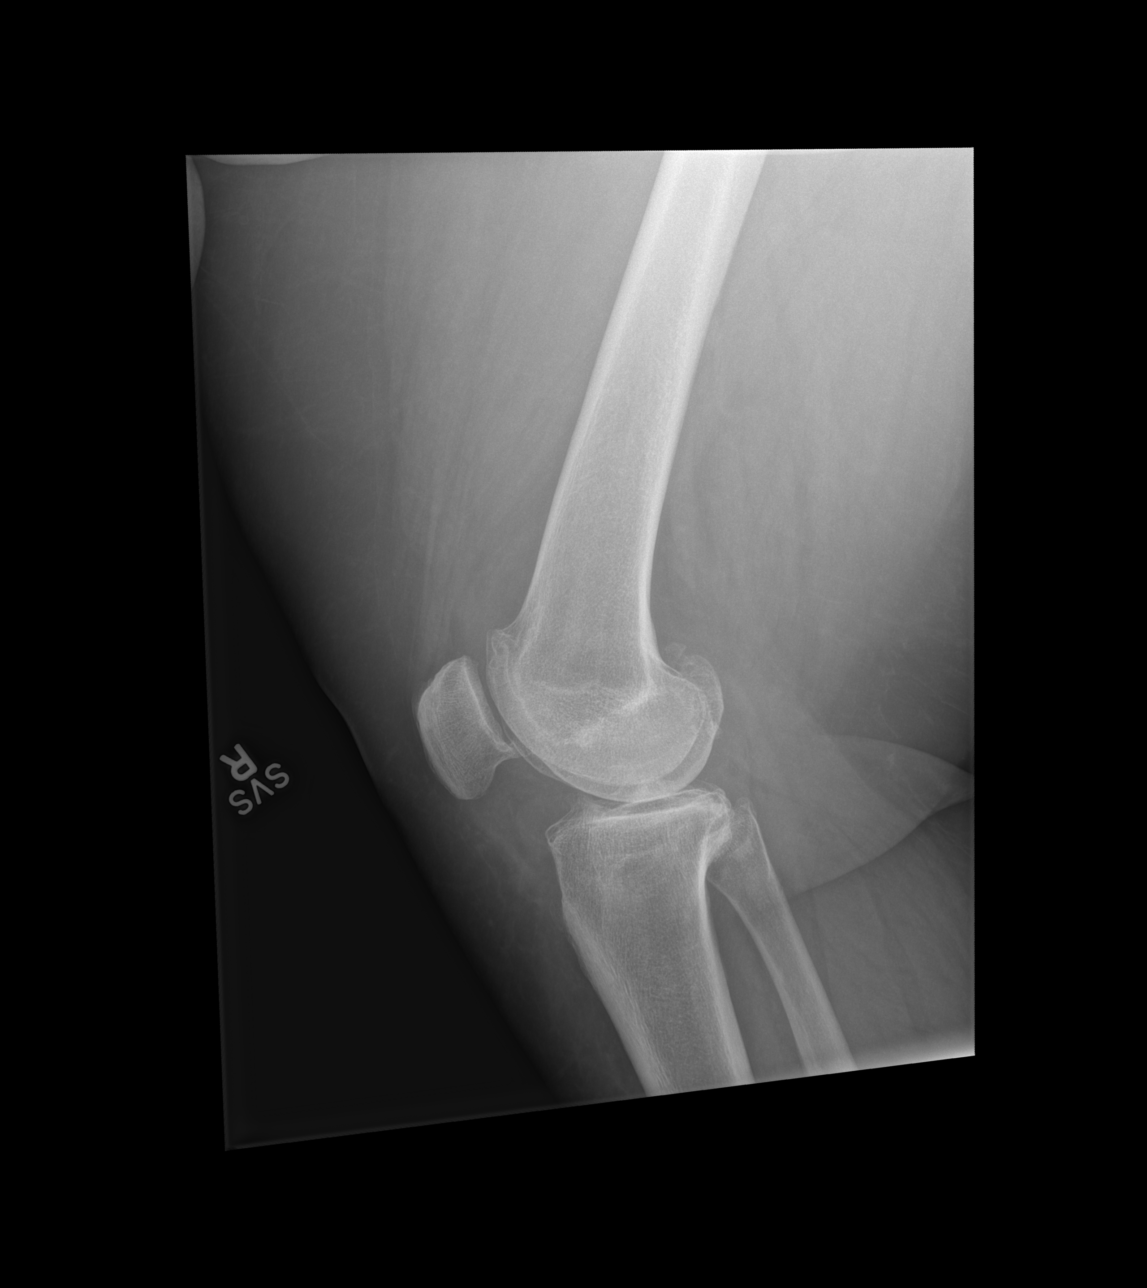

[4 of 4 positions shown; findings below may reference images not displayed]

FINDINGS: Frontal, lateral, and bilateral oblique views were obtained. There
is no fracture, dislocation, or effusion. There is rather marked
narrowing medially. There is moderate patellofemoral joint space
narrowing. There is spurring in all compartments. No erosive change.
IMPRESSION: Osteoarthritic change, most marked medially. No fracture or
effusion.

## 2014-12-04 IMAGING — CR DG HIP COMPLETE 2+V*R*
3 series · 3 of 3 positions shown · non-contrast
Comparison: Multiple priors

CLINICAL DATA: Fall today. Right hip common knee, and ankle pain.

EXAM:
RIGHT HIP - COMPLETE 2+ VIEW

[t pelvis ap]
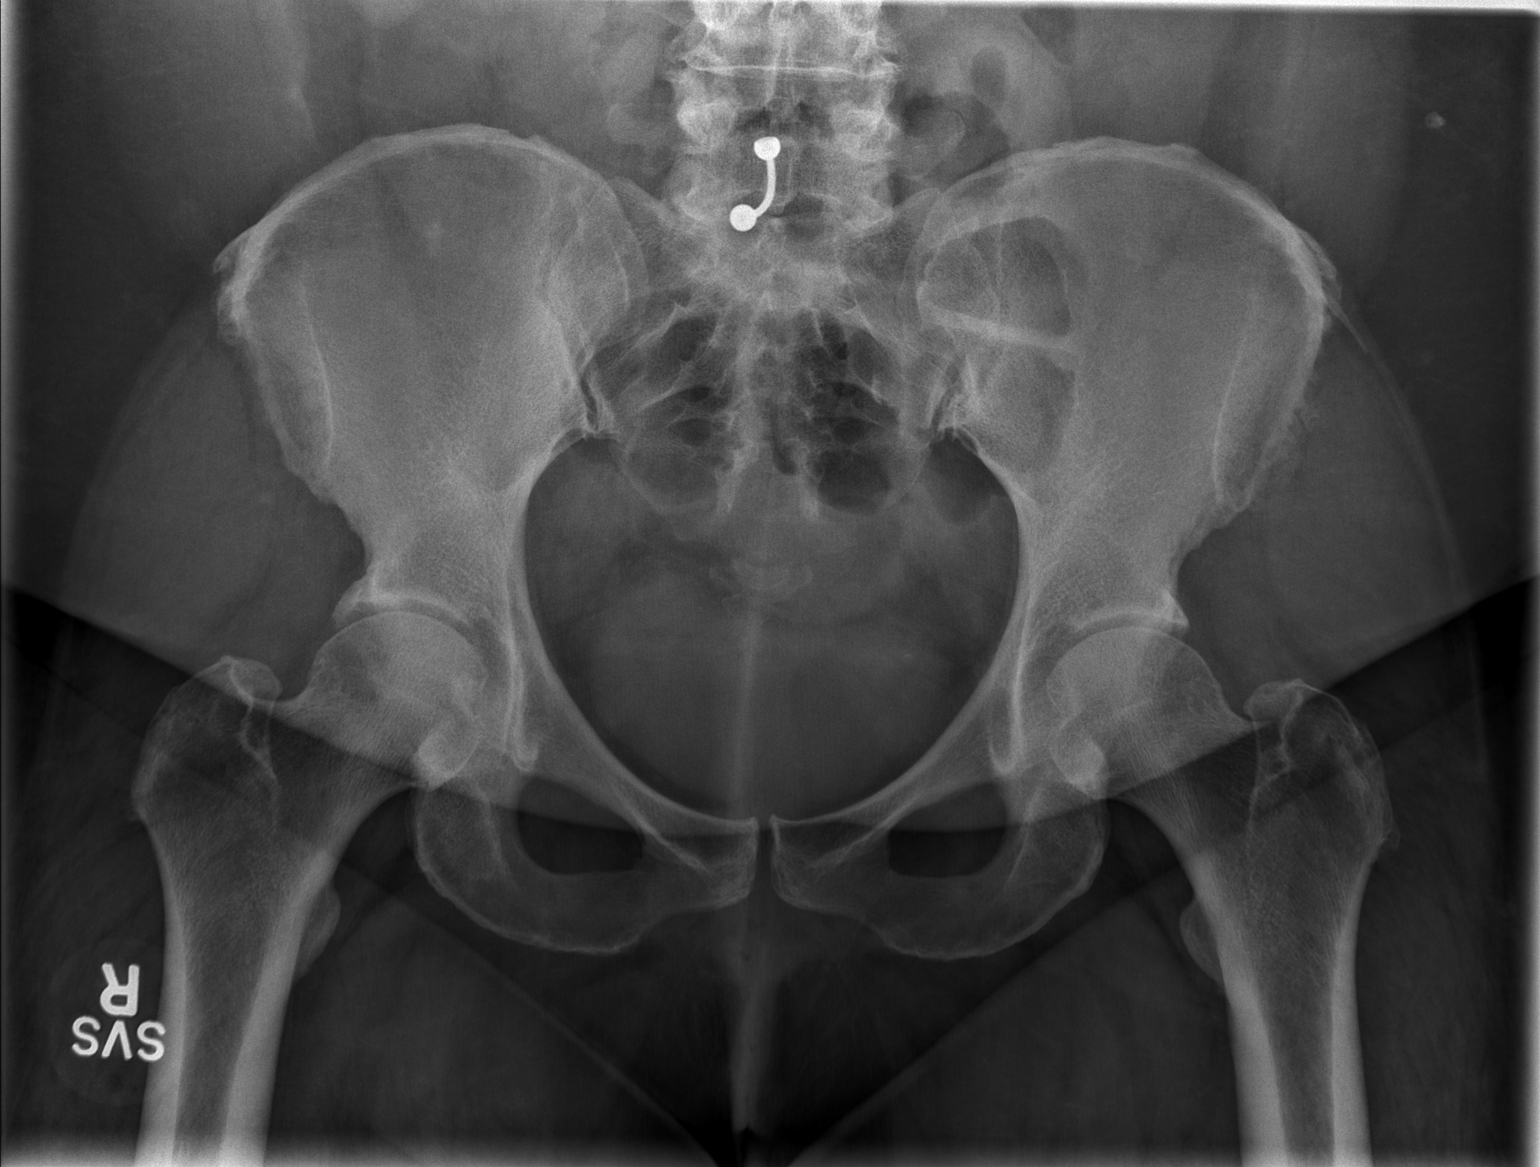

[t hip ap right]
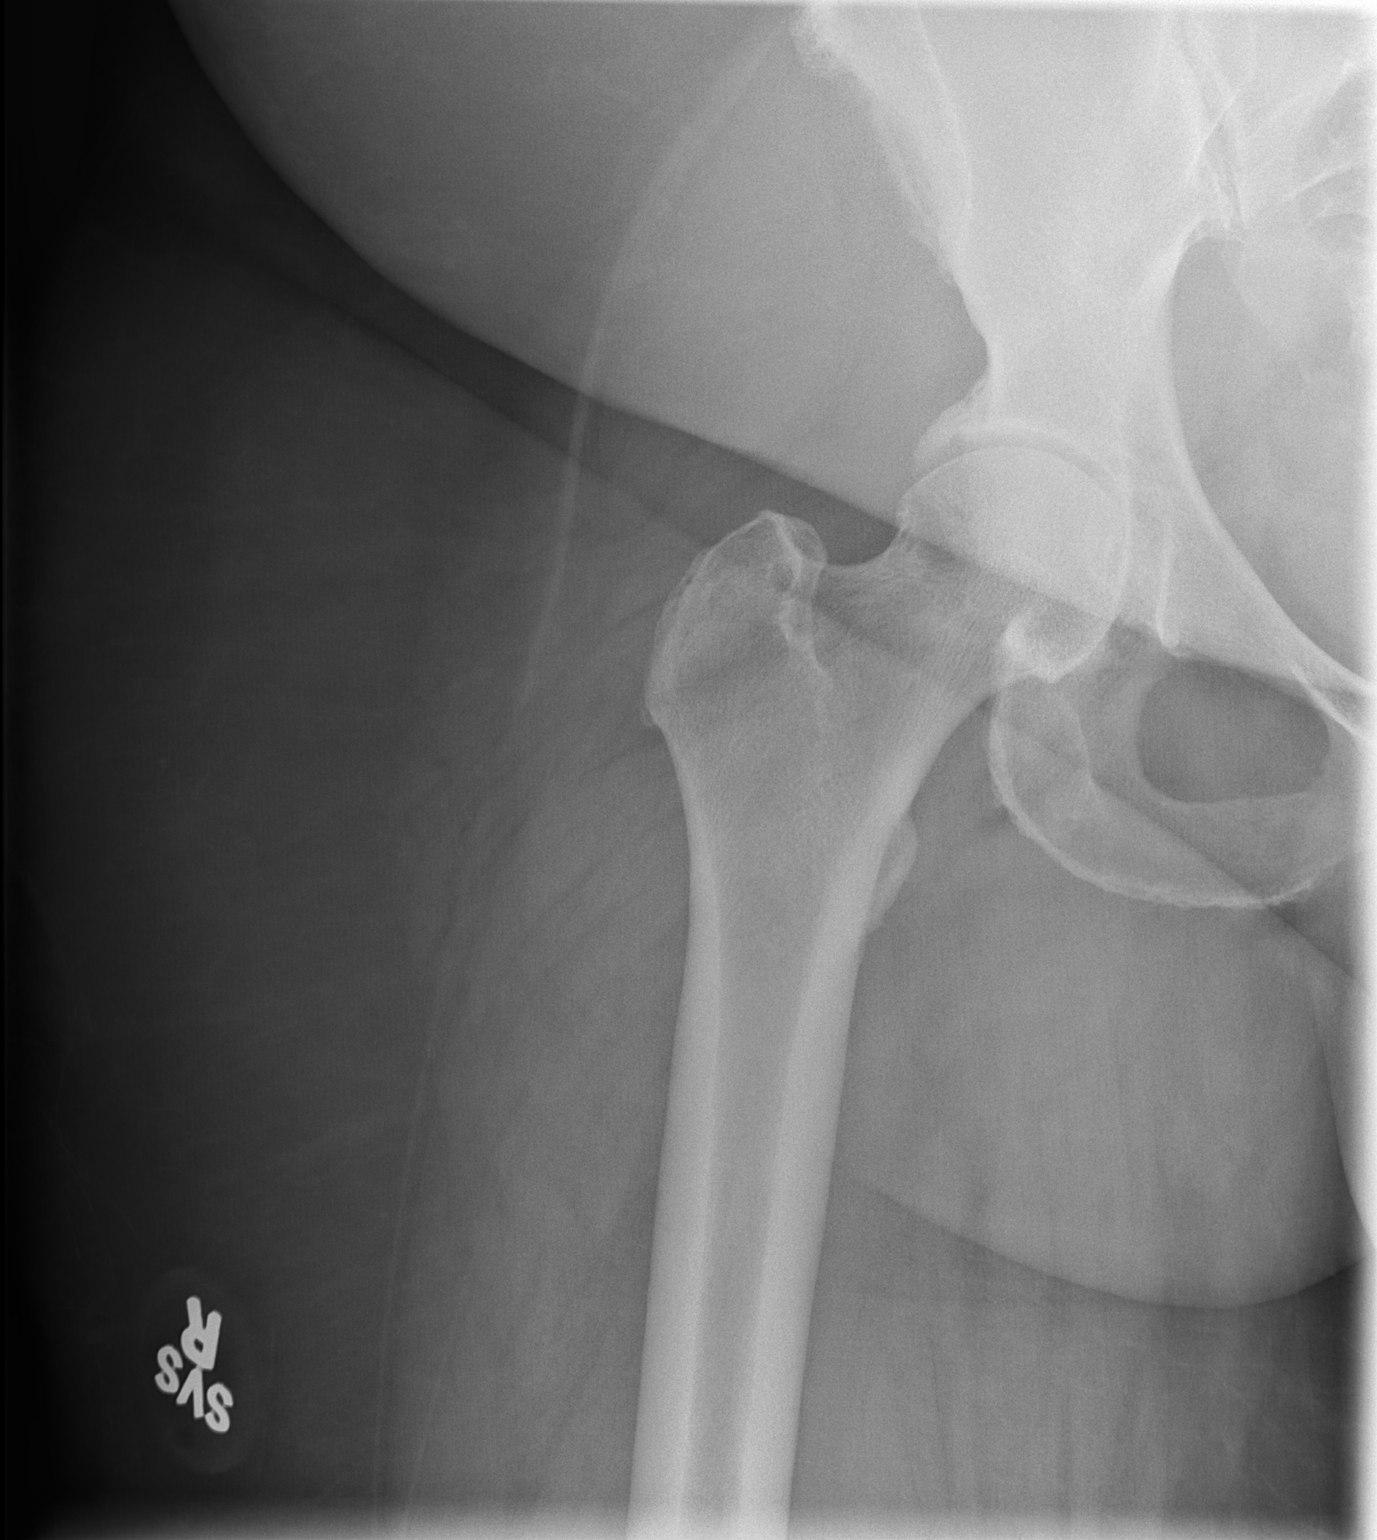

[t hip frog leg right]
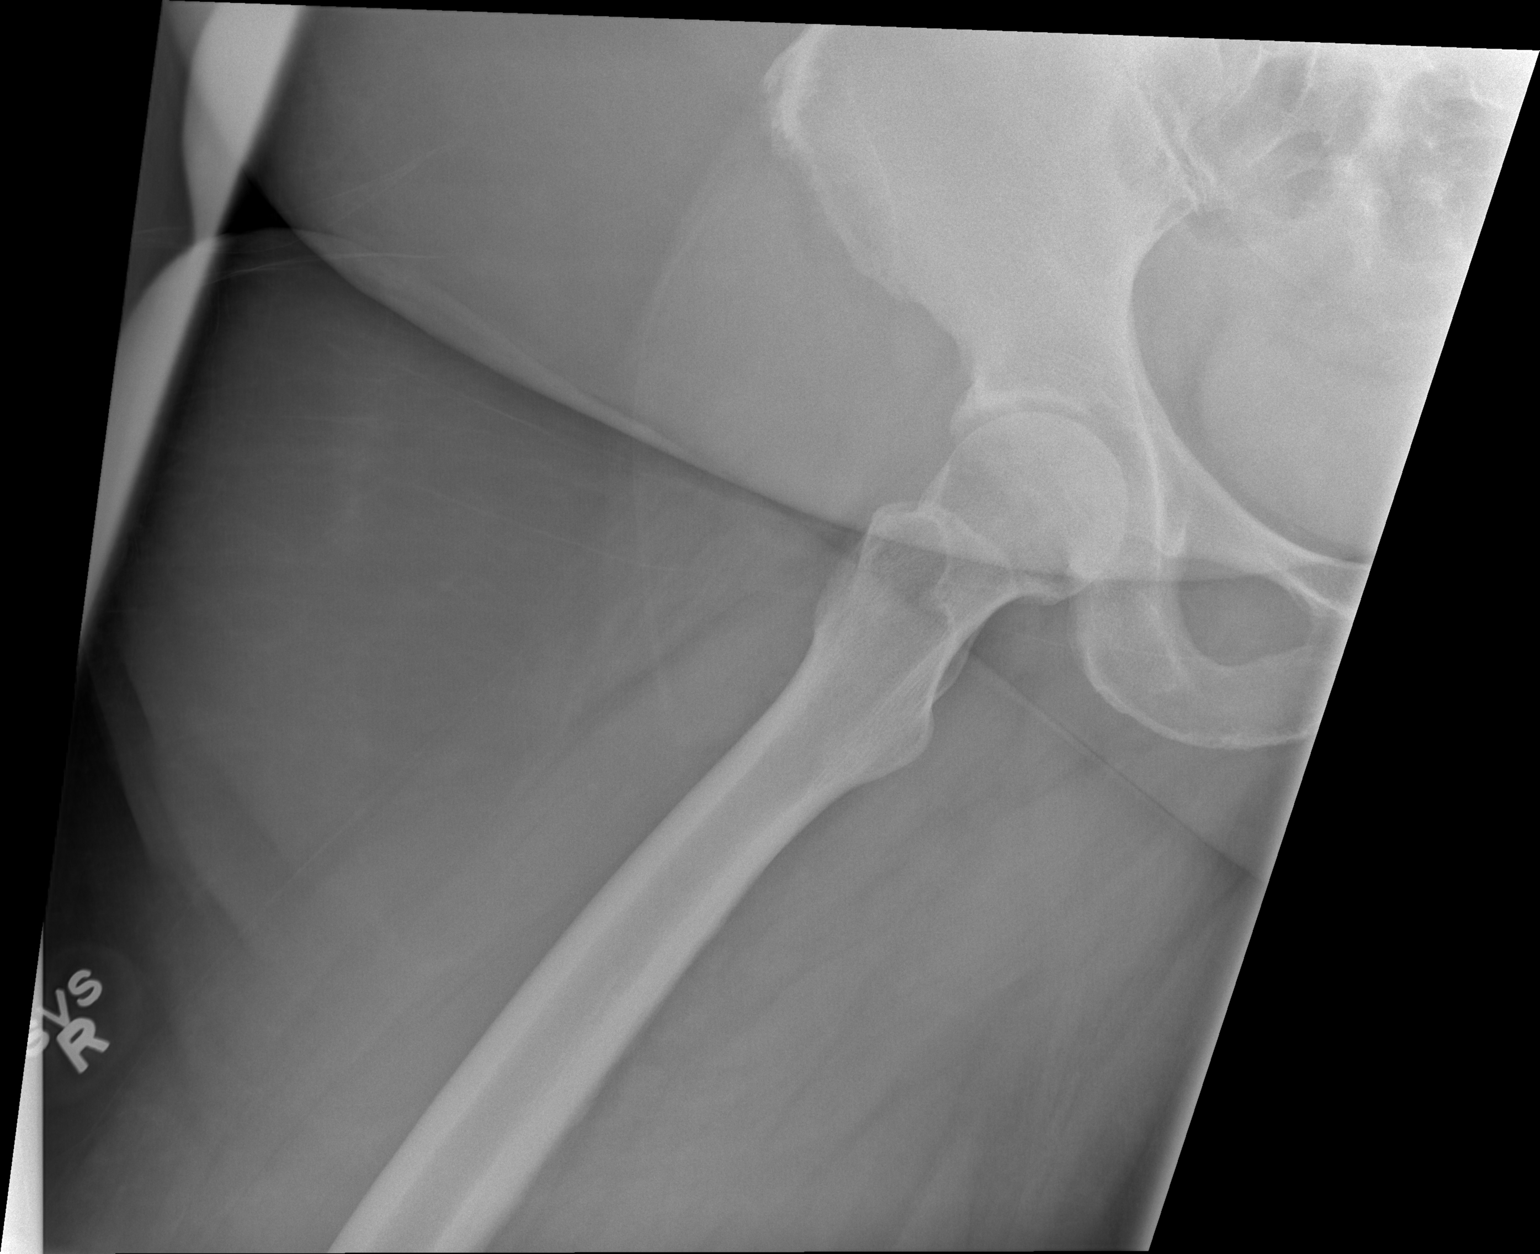

[3 of 3 positions shown; findings below may reference images not displayed]

FINDINGS: There is no evidence of hip fracture or dislocation. Mild
degenerative changes of both hips. No acute soft tissue abnormality.
IMPRESSION: No acute findings.

## 2014-12-19 ENCOUNTER — Ambulatory Visit: Payer: Commercial Managed Care - HMO | Admitting: Physical Medicine & Rehabilitation

## 2014-12-25 ENCOUNTER — Ambulatory Visit: Payer: Medicare HMO | Admitting: Physical Medicine & Rehabilitation

## 2014-12-25 ENCOUNTER — Ambulatory Visit: Payer: Commercial Managed Care - HMO | Admitting: Physical Medicine & Rehabilitation

## 2014-12-25 ENCOUNTER — Encounter: Payer: Medicare HMO | Attending: Physical Medicine & Rehabilitation

## 2014-12-28 IMAGING — CR DG CHEST 1V PORT
1 series · 1 of 1 positions shown · non-contrast
Comparison: Chest radiograph performed 08/18/2012

CLINICAL DATA: Chest pain, shortness of breath, cough and weakness.

EXAM:
PORTABLE CHEST - 1 VIEW

[AP]
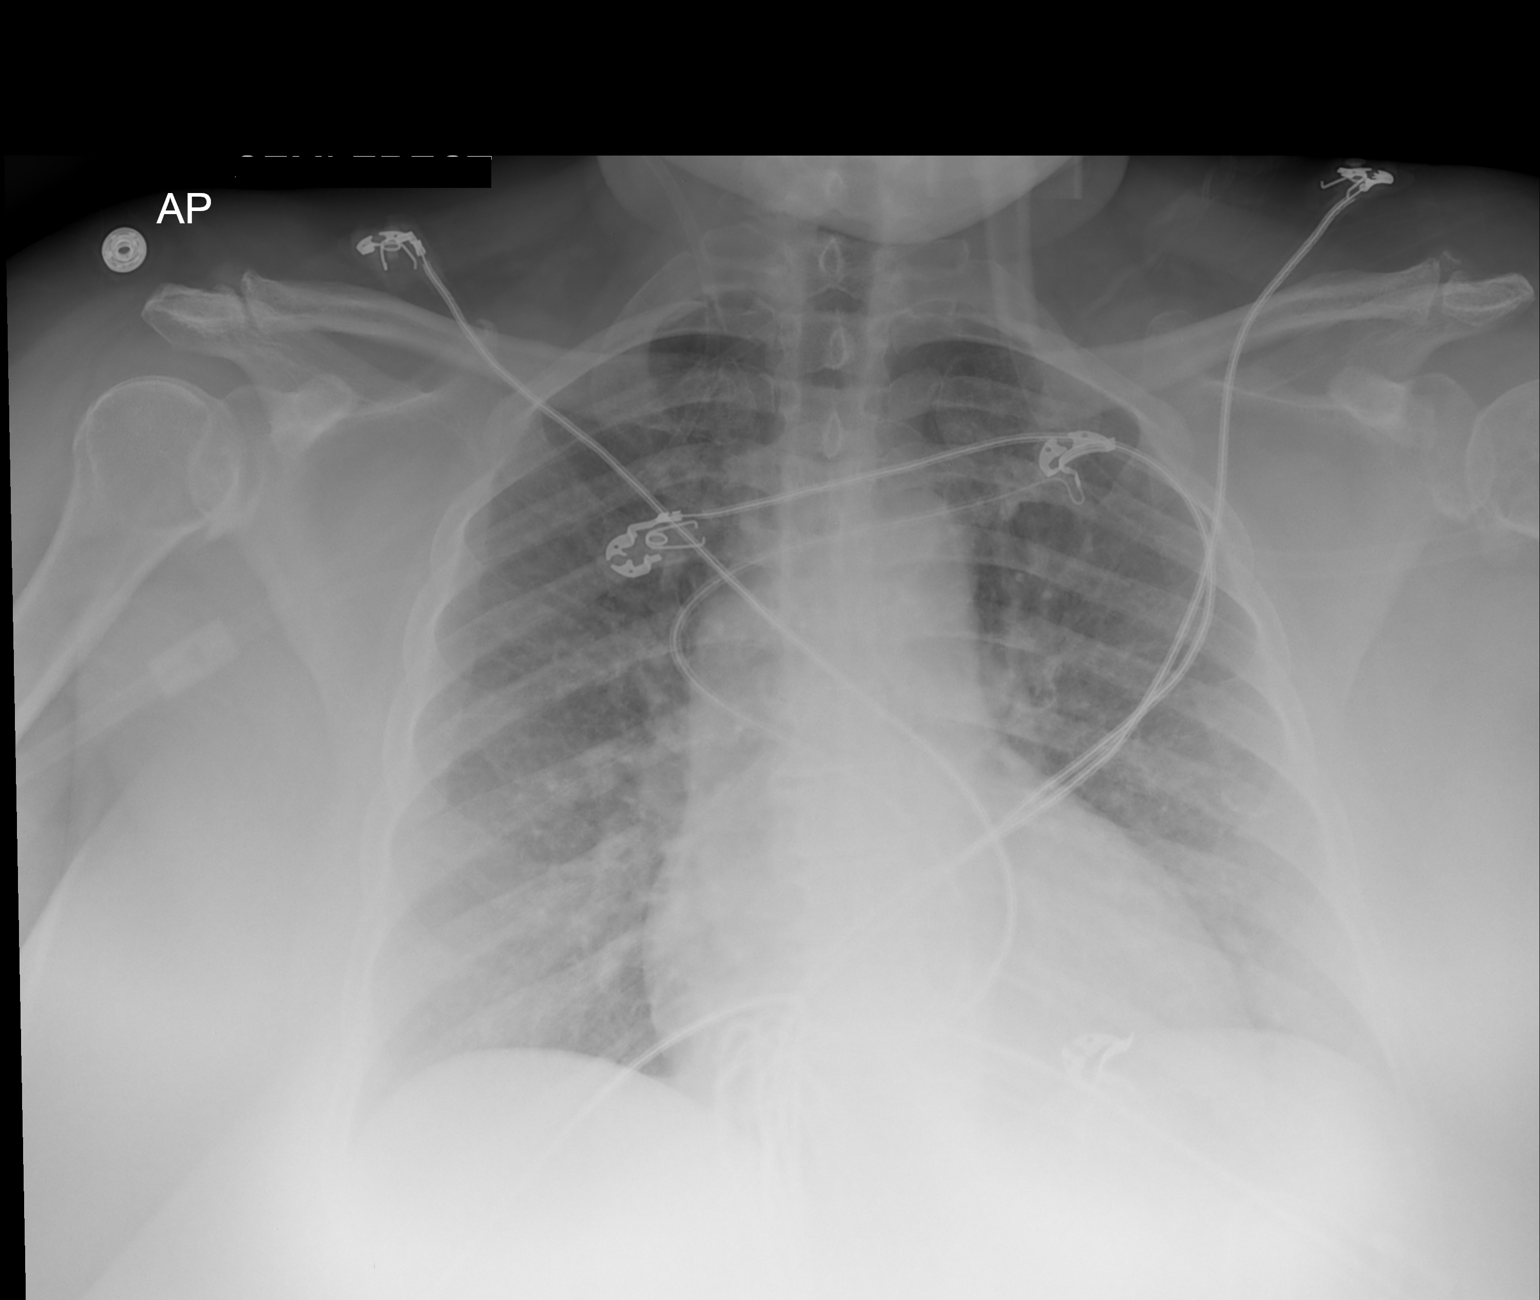

[1 of 1 positions shown; findings below may reference images not displayed]

FINDINGS: The lungs are well-aerated. Vascular congestion is noted, without
definite pulmonary edema. There is no evidence of focal
opacification, pleural effusion or pneumothorax.

The cardiomediastinal silhouette is borderline enlarged. No acute
osseous abnormalities are seen.
IMPRESSION: Vascular congestion and borderline cardiomegaly, without definite
pulmonary edema.

## 2014-12-30 IMAGING — CR DG THORACIC SPINE 2V
3 series · 3 of 3 positions shown · non-contrast
Comparison: None.

CLINICAL DATA: Back pain

EXAM:
THORACIC SPINE - 2 VIEW

[t t-spine a.p.]
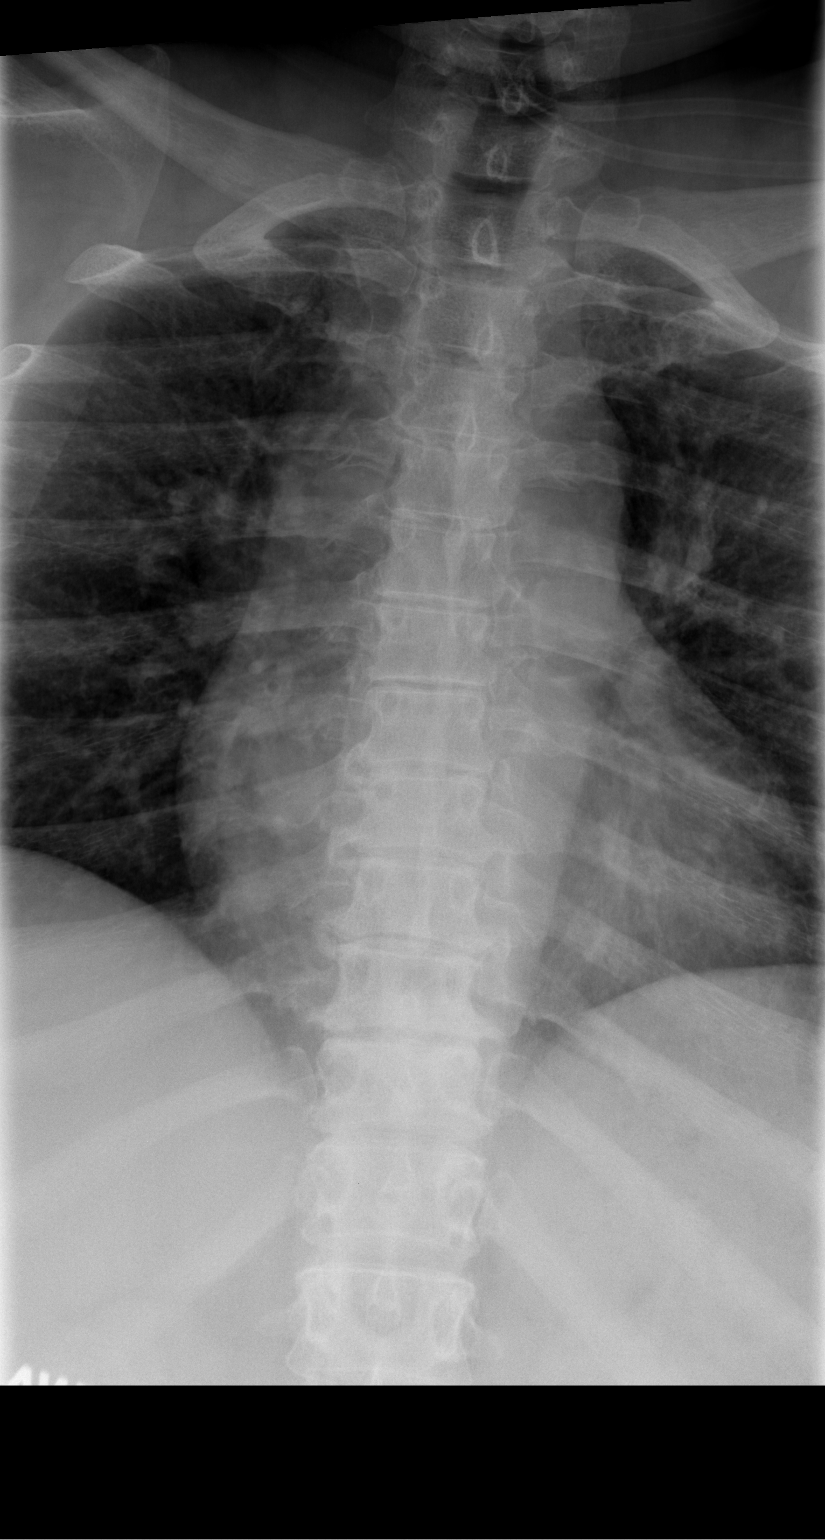

[t t-spine lat]
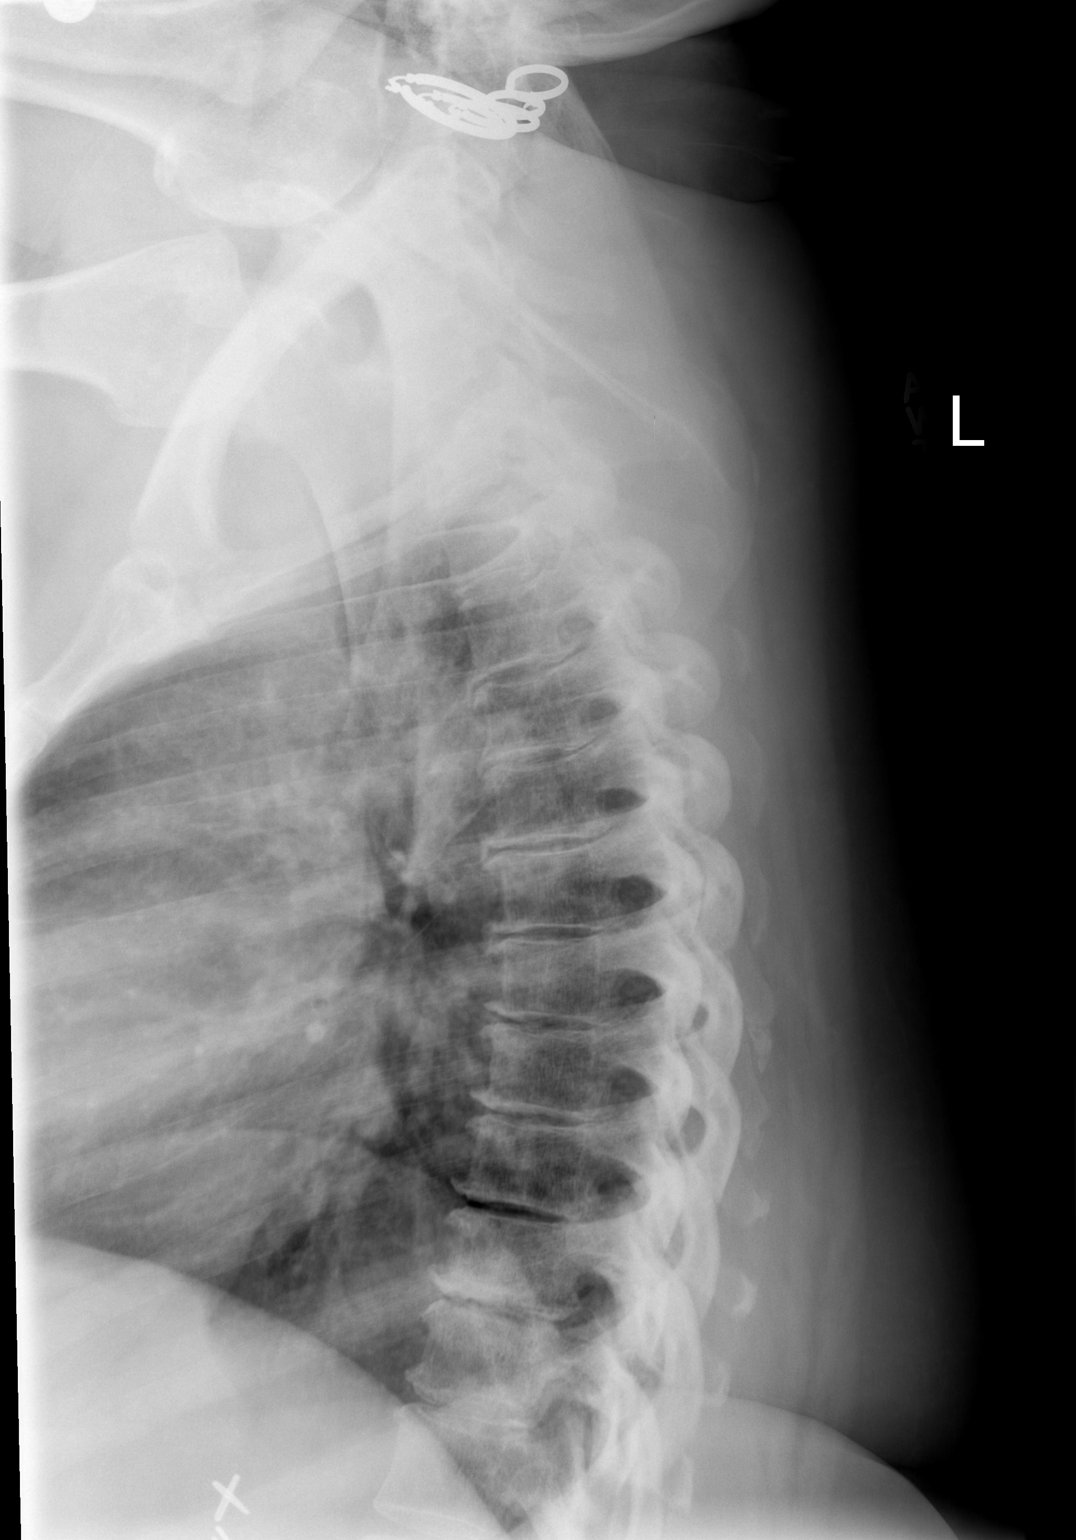

[t swimmers *]
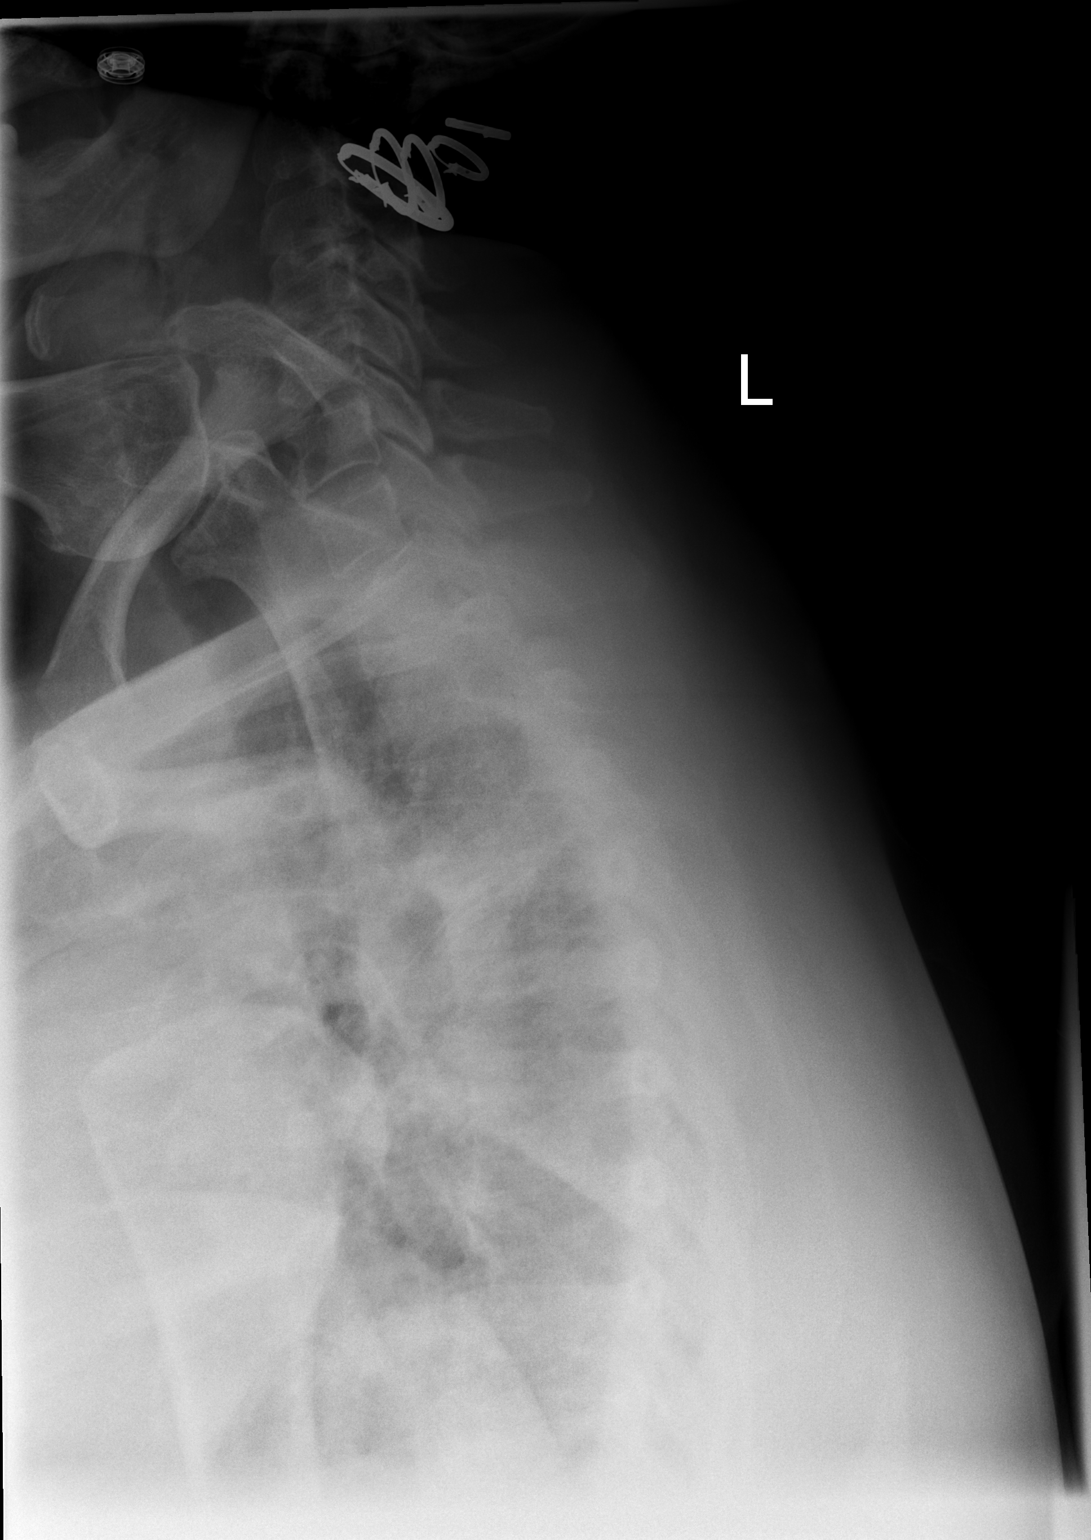

[3 of 3 positions shown; findings below may reference images not displayed]

FINDINGS: Multilevel osteophytic changes are noted. No compression deformities
are seen. No paraspinal mass lesion is noted.
IMPRESSION: Degenerative change without acute abnormality.

## 2014-12-30 IMAGING — CR DG LUMBAR SPINE 2-3V
3 series · 3 of 3 positions shown · non-contrast
Comparison: 03/25/2013

CLINICAL DATA: Low back pain

EXAM:
LUMBAR SPINE - 2-3 VIEW

[t l-spine a.p.]
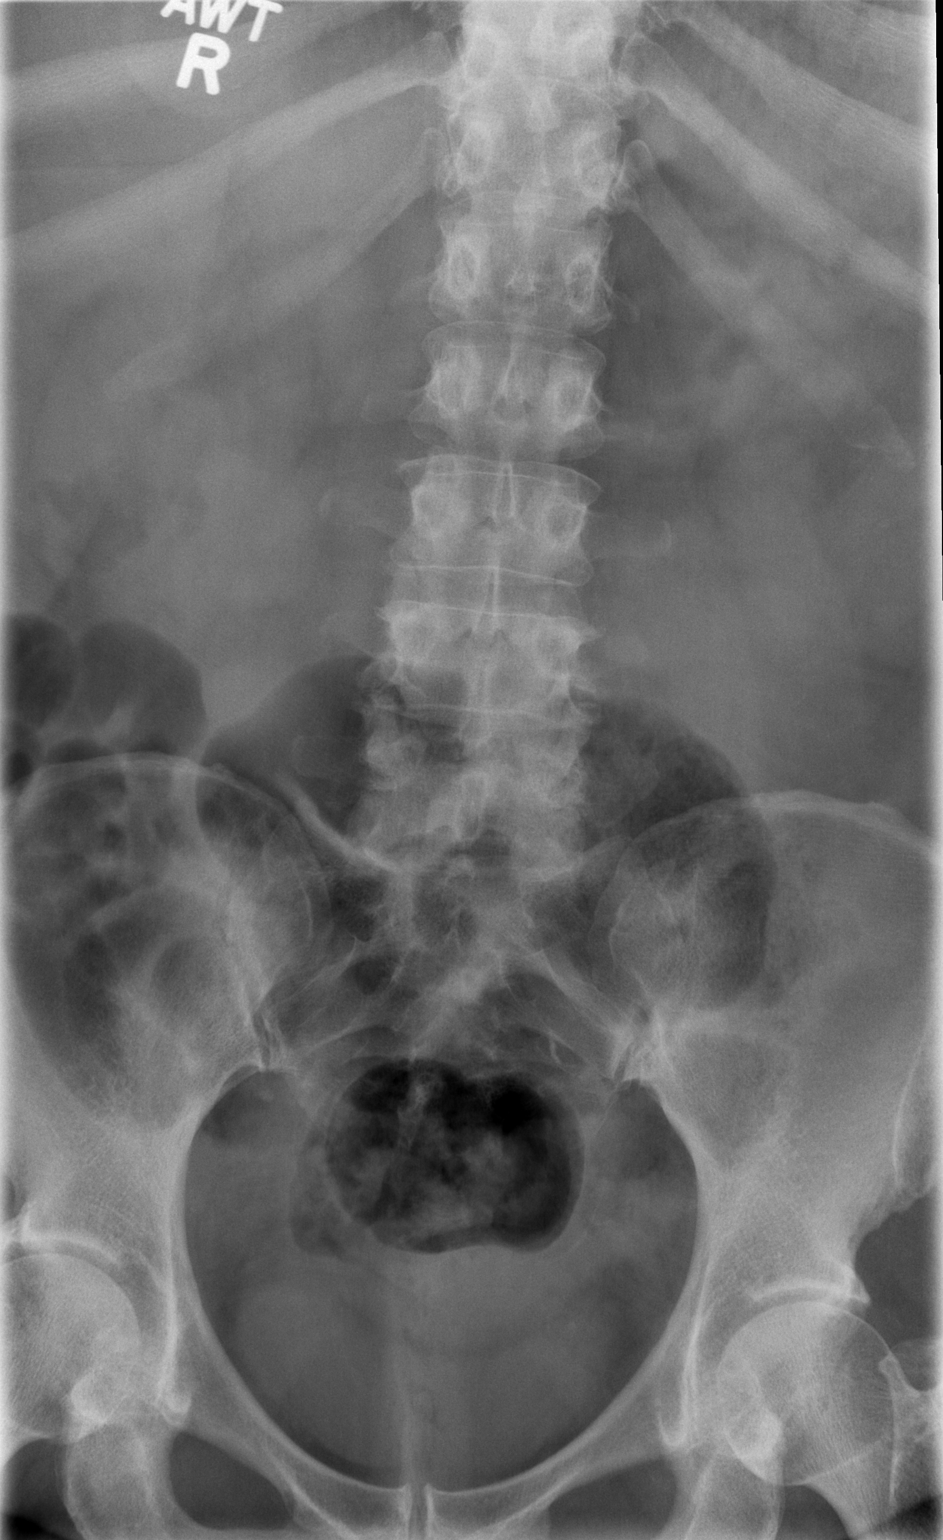

[t l-spine lat]
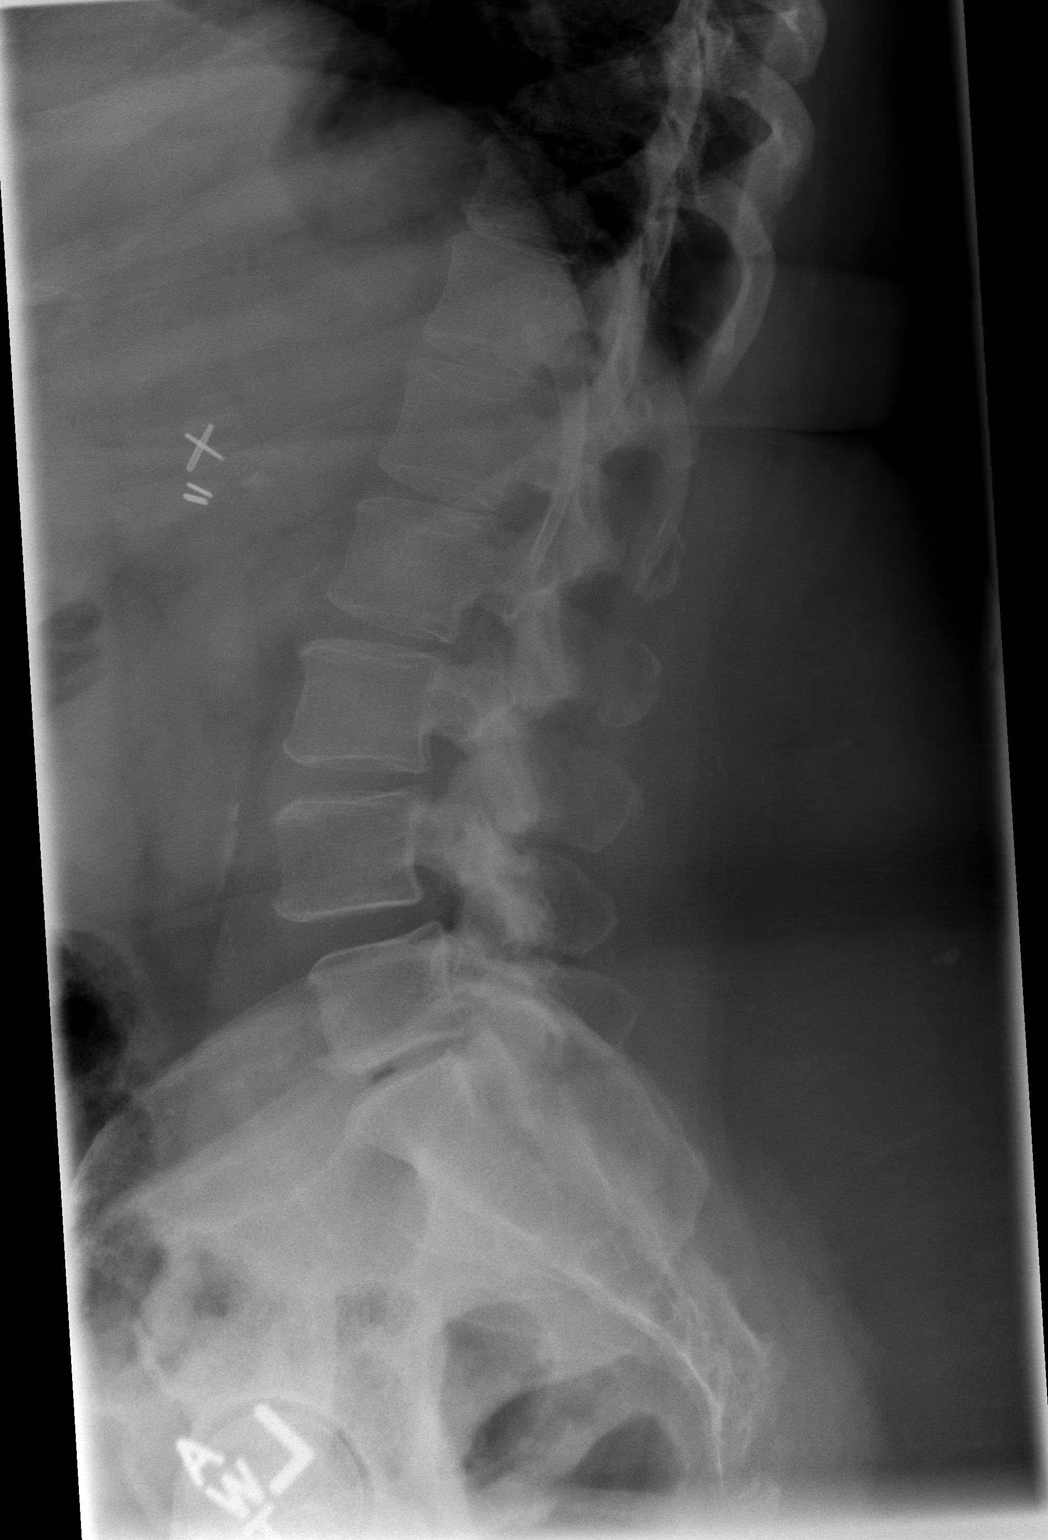

[t l-spine l5-s1 spot]
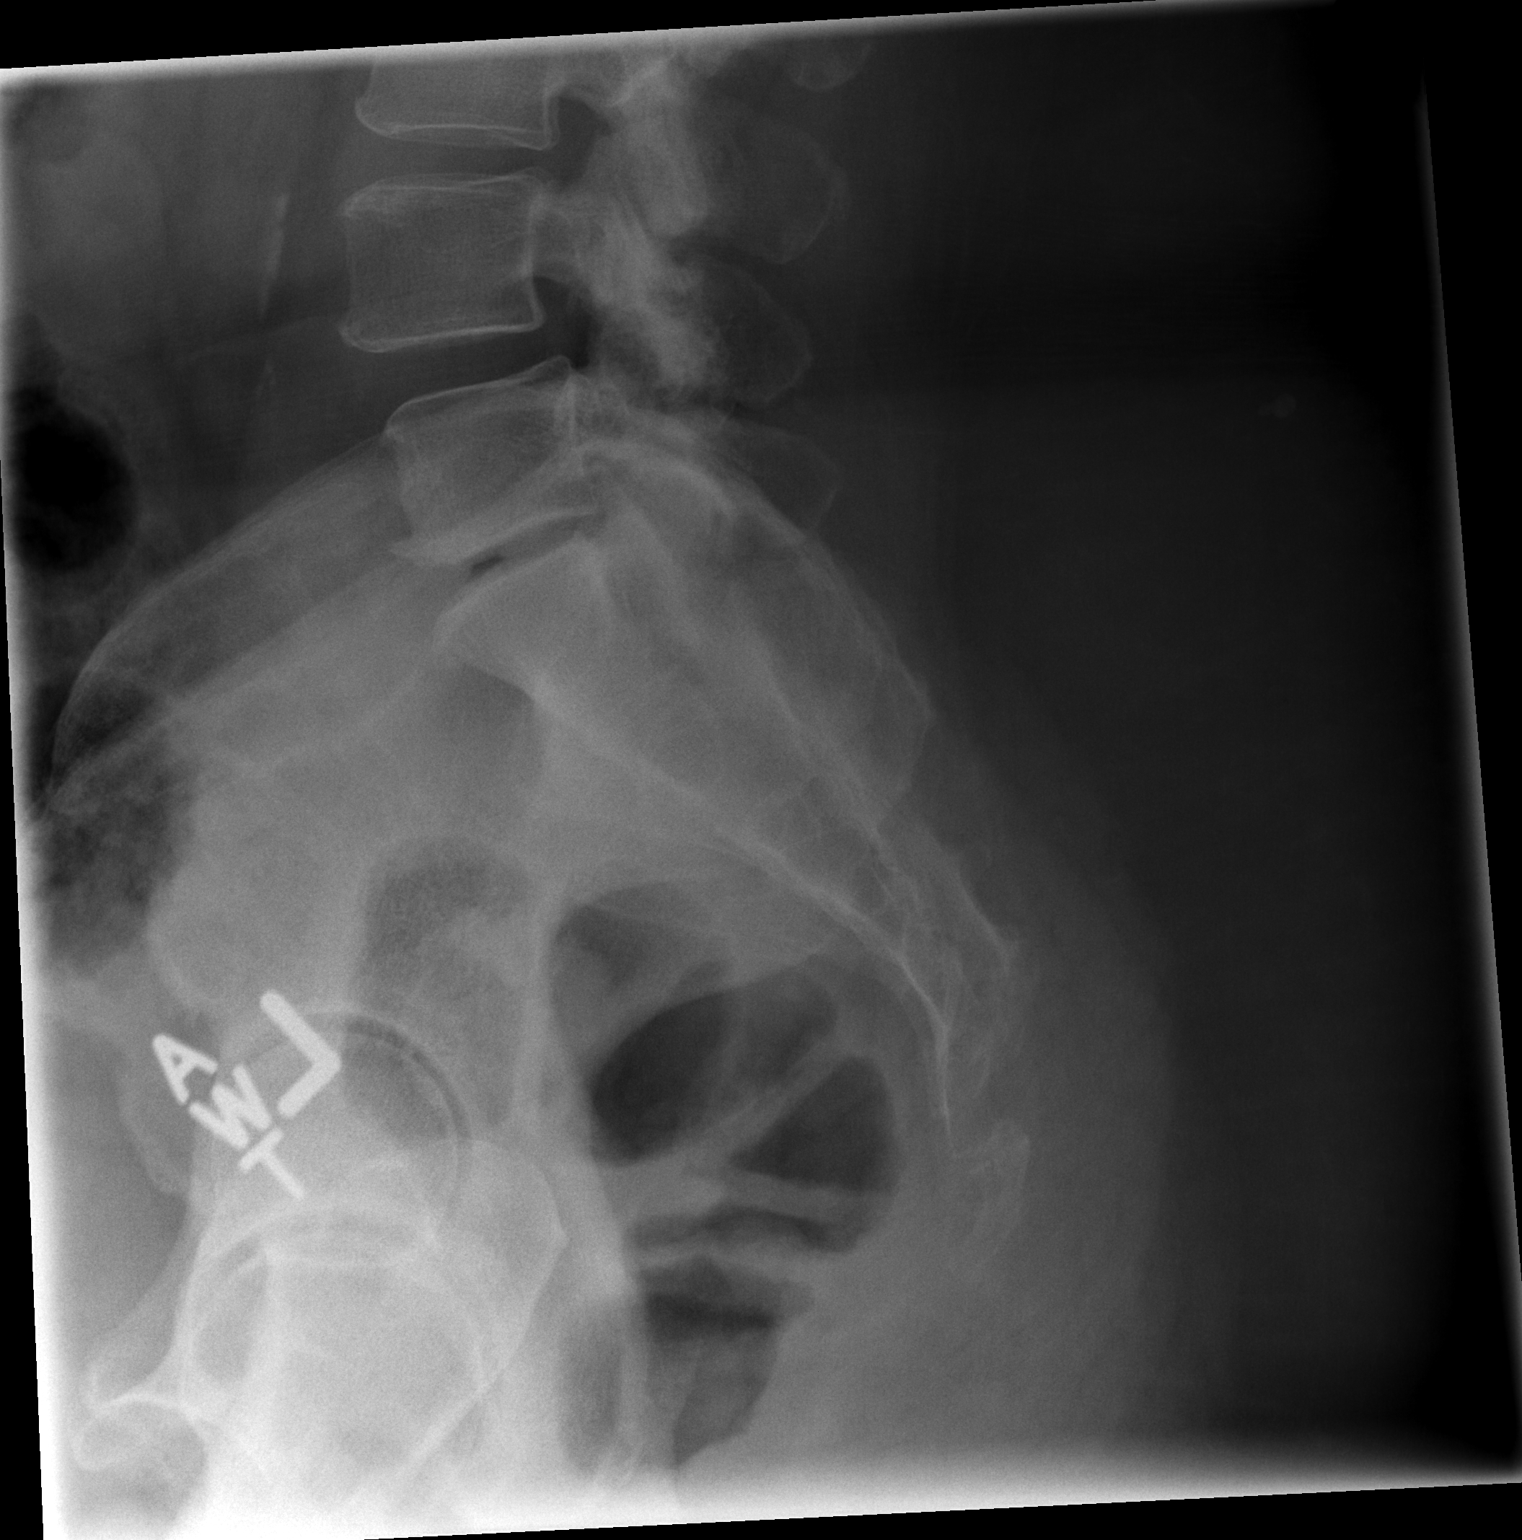

[3 of 3 positions shown; findings below may reference images not displayed]

FINDINGS: Five lumbar type vertebral bodies are well visualized.
Anterolisthesis of L4 on L5 is again noted. This is of a
degenerative nature. Disc space narrowing at L5-S1 is again seen.
IMPRESSION: Stable degenerative change.  No acute abnormality is noted.

## 2015-01-08 ENCOUNTER — Encounter: Payer: Self-pay | Admitting: Physical Medicine & Rehabilitation

## 2015-02-08 ENCOUNTER — Encounter: Payer: Self-pay | Admitting: Physical Medicine & Rehabilitation

## 2015-02-08 ENCOUNTER — Ambulatory Visit: Payer: Medicare HMO | Admitting: Physical Medicine & Rehabilitation

## 2015-02-08 VITALS — BP 144/88 | HR 96 | Resp 16

## 2015-02-08 NOTE — Progress Notes (Signed)
Subjective:    Patient ID: Brooke [name], female    DOB: [birth year], 59 y.o.   MRN: [id number]  HPI  Pain Inventory Average Pain 10 Pain Right Now 9 My pain is constant, sharp and stabbing  In the last 24 hours, has pain interfered with the following? General activity 7 Relation with others 8 Enjoyment of life 8 What TIME of day is your pain at its worst? morning Sleep (in general) Poor  Pain is worse with: walking, bending, sitting, inactivity and standing Pain improves with: medication Relief from Meds: 0  Mobility walk without assistance how many minutes can you walk? 5 ability to climb steps?  yes do you drive?  yes Do you have any goals in this area?  yes  Function disabled: date disabled na I need assistance with the following:  feeding, dressing and household duties Do you have any goals in this area?  yes  Neuro/Psych trouble walking spasms depression  Prior Studies Any changes since last visit?  no  Physicians involved in your care Any changes since last visit?  no   Family History  Problem Relation Age of Onset  . Uterine cancer Mother   . Stroke Father   . Colon cancer Maternal Uncle   . Diabetes Father   . Ovarian cancer Mother   . Hypertension Father   . Breast cancer Maternal Grandmother   . Diabetes Sister   . Asthma Child   . Asthma Child    Social History   Social History  . Marital Status: Widowed    Spouse Name: N/A  . Number of Children: 2  . Years of Education: N/A   Occupational History  . disabled    Social History Main Topics  . Smoking status: Current Some Day Smoker -- 1.00 packs/day for 37 years    Types: Cigarettes  . Smokeless tobacco: Never Used     Comment: trying to quit, down to .5ppd X2 years ago.   . Alcohol Use: No  . Drug Use: No  . Sexual Activity: Yes   Other Topics Concern  . None   Social History Narrative   Lives alone.  Widowed but has a female companion who helps out.  Ambulates with a  cane.  Drinks coffee daily.            Past Surgical History  Procedure Laterality Date  . Knee arthroscopy      bil.  . Carpal tunnel release      rt  . Mouth surgery      teeth extraction  . Cesarean section    . Subtotal colectomy    . Colonoscopy    . Tubal ligation     Past Medical History  Diagnosis Date  . Hypertension   . Asthma   . COPD (chronic obstructive pulmonary disease)   . Anxiety   . Cancer   . Colon cancer   . Dyslipidemia   . Migraine headache   . Chronic bronchitis   . Uterine fibroid   . Ovarian cyst   . GERD (gastroesophageal reflux disease)   . Morbid obesity   . Obstructive sleep apnea     mild  . Chronic pain   . Chronic back pain   . Arthritis   . Osteoarthritis   . Depression   . DVT (deep vein thrombosis) in pregnancy    BP 144/88 mmHg  Pulse 96  Resp 16  SpO2 98%  LMP 05/24/2003  Opioid Risk Score:  Fall Risk Score:  `1  Depression screen PHQ 2/9  Depression screen PHQ 2/9 02/08/2015  Decreased Interest 3  Down, Depressed, Hopeless 1  PHQ - 2 Score 4      Review of Systems  Musculoskeletal: Positive for gait problem.  Neurological:       Spasms  Psychiatric/Behavioral: Positive for dysphoric mood.  All other systems reviewed and are negative.      Objective:   Physical Exam        Assessment & Plan:

## 2015-02-14 ENCOUNTER — Ambulatory Visit (HOSPITAL_COMMUNITY): Payer: Commercial Managed Care - HMO | Admitting: Psychiatry

## 2015-02-14 IMAGING — CR DG HUMERUS 2V *R*
2 series · 2 of 2 positions shown · non-contrast
Comparison: None.

CLINICAL DATA: Pain

EXAM:
RIGHT HUMERUS - 2+ VIEW

[w humerus ap left]
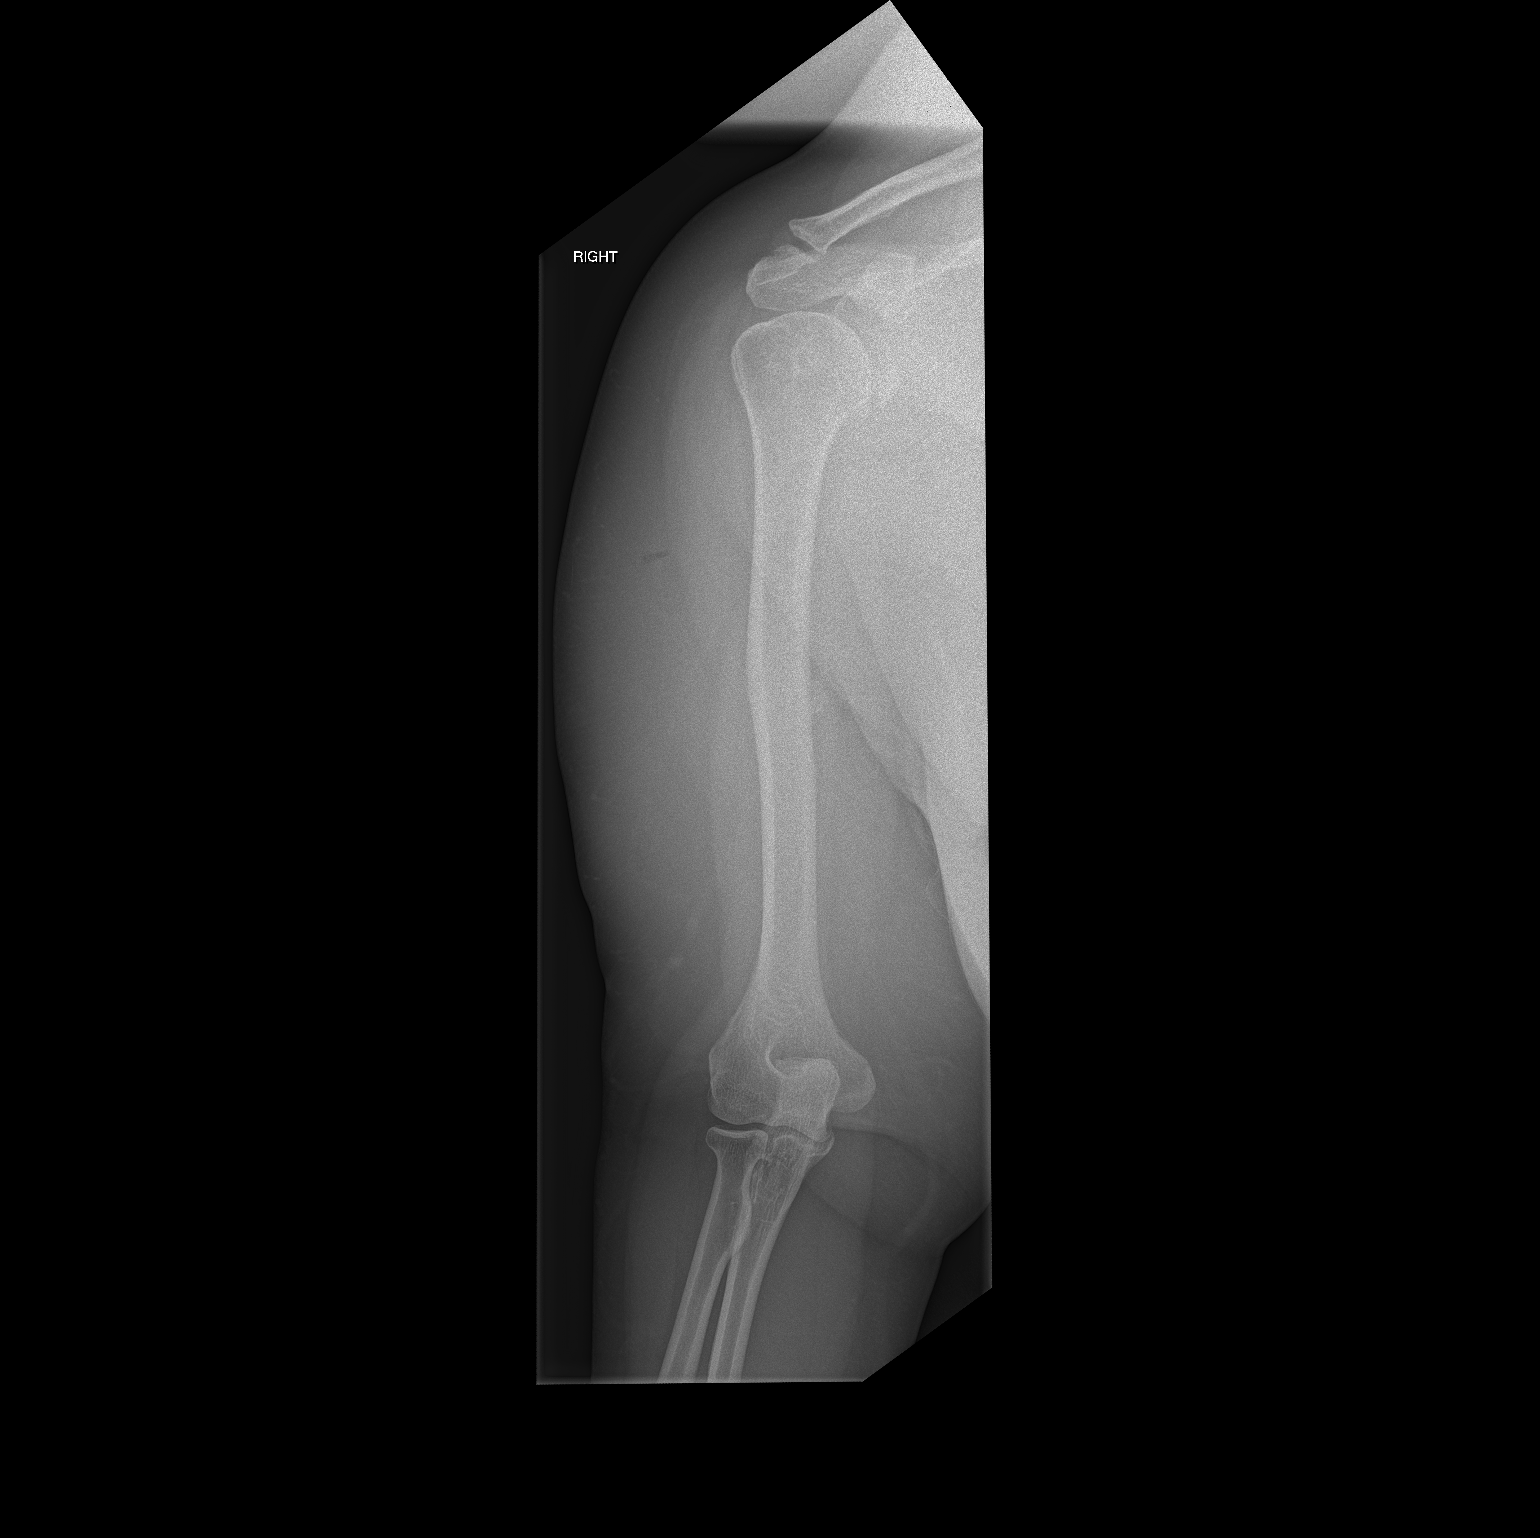

[w humerus lat left]
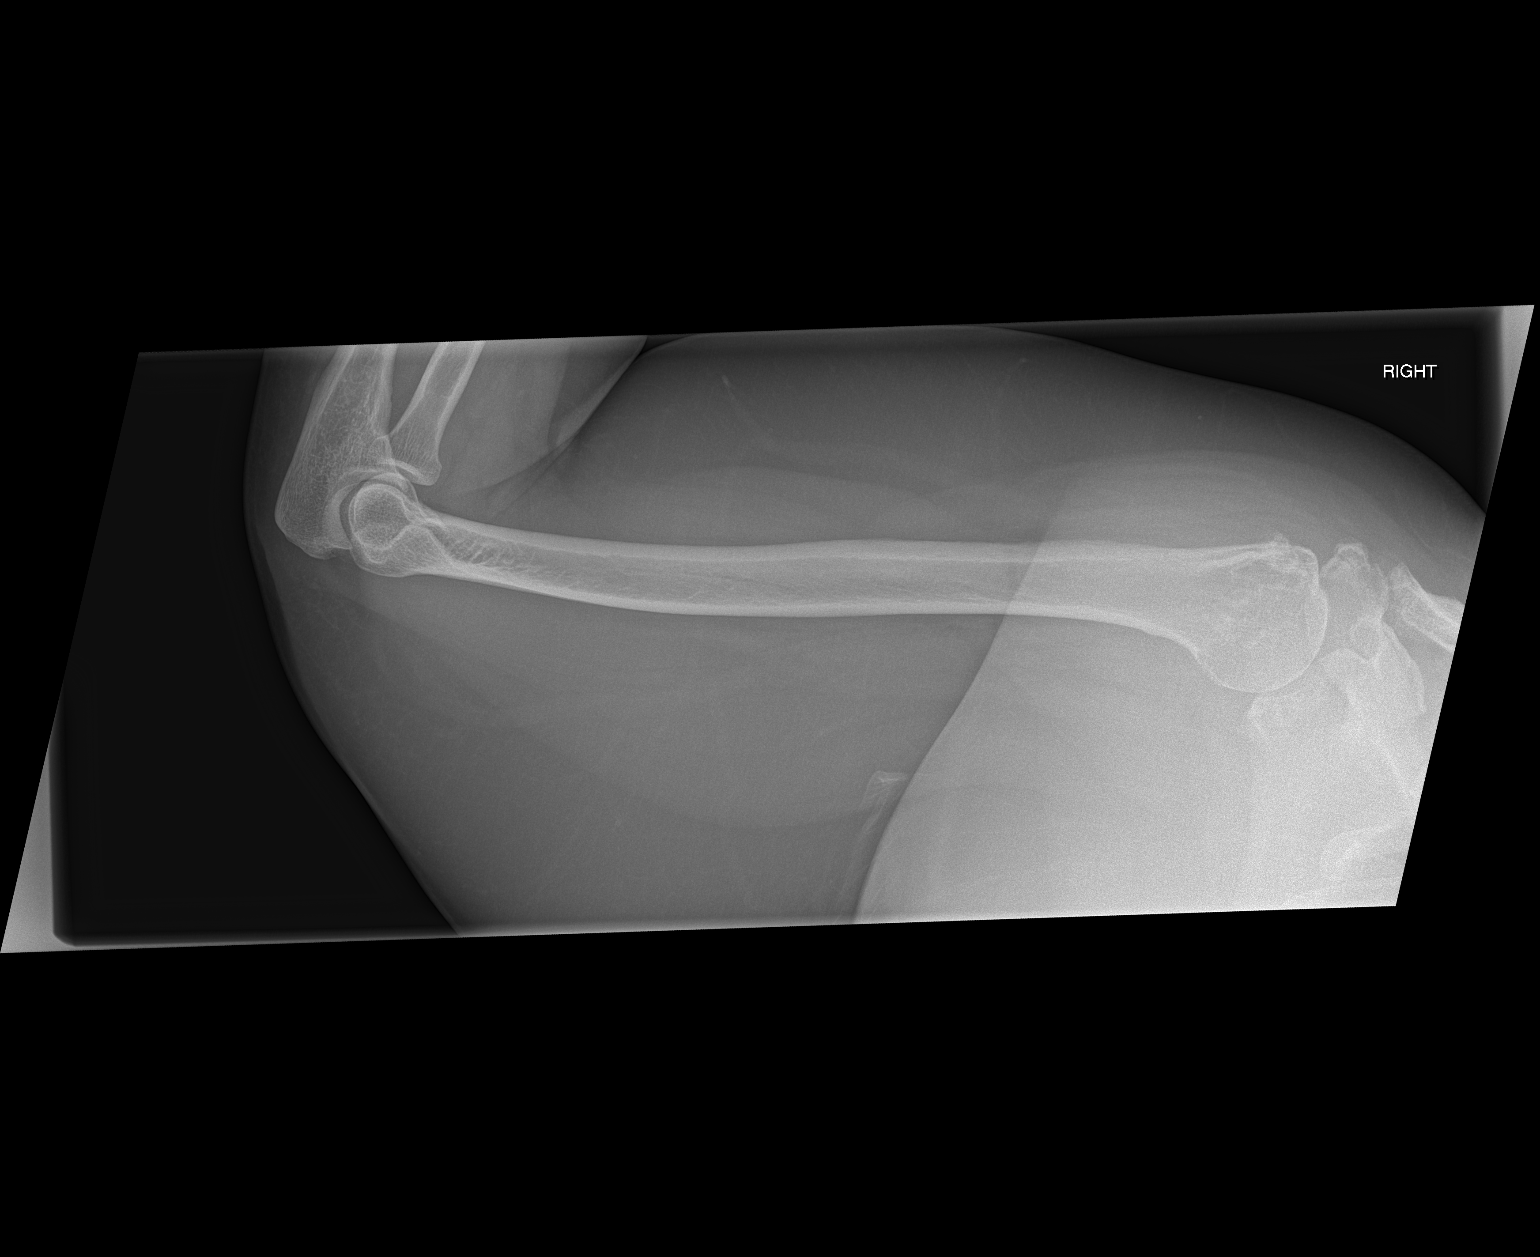

[2 of 2 positions shown; findings below may reference images not displayed]

FINDINGS: Frontal and lateral views were obtained. No fracture or dislocation.
No abnormal periosteal reaction. There is osteoarthritic change in
the right shoulder joint.
IMPRESSION: Osteoarthritic change right shoulder joint. No fracture or
dislocation.

## 2015-02-15 ENCOUNTER — Emergency Department (HOSPITAL_COMMUNITY): Payer: Medicare HMO

## 2015-02-15 ENCOUNTER — Encounter (HOSPITAL_COMMUNITY): Payer: Self-pay | Admitting: Emergency Medicine

## 2015-02-15 ENCOUNTER — Emergency Department (HOSPITAL_COMMUNITY)
Admission: EM | Admit: 2015-02-15 | Discharge: 2015-02-15 | Disposition: A | Discharge status: Home / Self Care | Payer: Medicare HMO | Attending: Emergency Medicine | Admitting: Emergency Medicine

## 2015-02-15 DIAGNOSIS — Z85038 Personal history of other malignant neoplasm of large intestine: Secondary | ICD-10-CM | POA: Diagnosis not present

## 2015-02-15 DIAGNOSIS — Z86018 Personal history of other benign neoplasm: Secondary | ICD-10-CM | POA: Insufficient documentation

## 2015-02-15 DIAGNOSIS — J449 Chronic obstructive pulmonary disease, unspecified: Secondary | ICD-10-CM | POA: Insufficient documentation

## 2015-02-15 DIAGNOSIS — F419 Anxiety disorder, unspecified: Secondary | ICD-10-CM | POA: Diagnosis not present

## 2015-02-15 DIAGNOSIS — G8929 Other chronic pain: Secondary | ICD-10-CM | POA: Diagnosis not present

## 2015-02-15 DIAGNOSIS — F329 Major depressive disorder, single episode, unspecified: Secondary | ICD-10-CM | POA: Diagnosis not present

## 2015-02-15 DIAGNOSIS — Z86718 Personal history of other venous thrombosis and embolism: Secondary | ICD-10-CM | POA: Diagnosis not present

## 2015-02-15 DIAGNOSIS — Z79899 Other long term (current) drug therapy: Secondary | ICD-10-CM | POA: Diagnosis not present

## 2015-02-15 DIAGNOSIS — R51 Headache: Secondary | ICD-10-CM | POA: Insufficient documentation

## 2015-02-15 DIAGNOSIS — K219 Gastro-esophageal reflux disease without esophagitis: Secondary | ICD-10-CM | POA: Diagnosis not present

## 2015-02-15 DIAGNOSIS — I10 Essential (primary) hypertension: Secondary | ICD-10-CM | POA: Diagnosis not present

## 2015-02-15 DIAGNOSIS — Z720 Tobacco use: Secondary | ICD-10-CM | POA: Diagnosis not present

## 2015-02-15 DIAGNOSIS — Z7901 Long term (current) use of anticoagulants: Secondary | ICD-10-CM | POA: Insufficient documentation

## 2015-02-15 DIAGNOSIS — M1990 Unspecified osteoarthritis, unspecified site: Secondary | ICD-10-CM | POA: Diagnosis not present

## 2015-02-15 DIAGNOSIS — R519 Headache, unspecified: Secondary | ICD-10-CM

## 2015-02-15 LAB — COMPREHENSIVE METABOLIC PANEL
ALK PHOS: 99 U/L (ref 38–126)
ALT: 14 U/L (ref 14–54)
AST: 25 U/L (ref 15–41)
Albumin: 4.5 g/dL (ref 3.5–5.0)
Anion gap: 11 (ref 5–15)
BILIRUBIN TOTAL: 0.6 mg/dL (ref 0.3–1.2)
BUN: 10 mg/dL (ref 6–20)
CHLORIDE: 101 mmol/L (ref 101–111)
CO2: 28 mmol/L (ref 22–32)
CREATININE: 0.71 mg/dL (ref 0.44–1.00)
Calcium: 9.3 mg/dL (ref 8.9–10.3)
GFR calc Af Amer: >60 mL/min (ref >60)
GFR calc non Af Amer: >60 mL/min (ref >60)
Glucose, Bld: 122 mg/dL — ABNORMAL HIGH (ref 65–99)
Potassium: 3.5 mmol/L (ref 3.5–5.1)
Sodium: 140 mmol/L (ref 135–145)
Total Protein: 8.9 g/dL — ABNORMAL HIGH (ref 6.5–8.1)

## 2015-02-15 LAB — CBC WITH DIFFERENTIAL/PLATELET
BASOS ABS: 0 10*3/uL (ref 0.0–0.1)
Basophils Relative: 0 % (ref 0–1)
Eosinophils Absolute: 0 10*3/uL (ref 0.0–0.7)
Eosinophils Relative: 1 % (ref 0–5)
HEMATOCRIT: 49.1 % — AB (ref 36.0–46.0)
HEMOGLOBIN: 16.5 g/dL — AB (ref 12.0–15.0)
LYMPHS PCT: 32 % (ref 12–46)
Lymphs Abs: 1.9 10*3/uL (ref 0.7–4.0)
MCH: 33.3 pg (ref 26.0–34.0)
MCHC: 33.6 g/dL (ref 30.0–36.0)
MCV: 99.2 fL (ref 78.0–100.0)
Monocytes Absolute: 0.3 10*3/uL (ref 0.1–1.0)
Monocytes Relative: 4 % (ref 3–12)
NEUTROS PCT: 63 % (ref 43–77)
Neutro Abs: 3.7 10*3/uL (ref 1.7–7.7)
PLATELETS: 195 10*3/uL (ref 150–400)
RBC: 4.95 MIL/uL (ref 3.87–5.11)
RDW: 14 % (ref 11.5–15.5)
WBC: 5.9 10*3/uL (ref 4.0–10.5)

## 2015-02-15 LAB — PROTIME-INR
INR: 1.46 (ref 0.00–1.49)
Prothrombin Time: 17.8 seconds — ABNORMAL HIGH (ref 11.6–15.2)

## 2015-02-15 LAB — SEDIMENTATION RATE: Sed Rate: 22 mm/hr (ref 0–22)

## 2015-02-15 MED ORDER — FENTANYL CITRATE (PF) 100 MCG/2ML IJ SOLN
50.0000 ug | Freq: Once | INTRAMUSCULAR | Status: AC
  Administered 2015-02-15: 50 ug via INTRAVENOUS
  Filled 2015-02-15: qty 2

## 2015-02-15 MED ORDER — KETOROLAC TROMETHAMINE 30 MG/ML IJ SOLN
30.0000 mg | Freq: Once | INTRAMUSCULAR | Status: AC
  Administered 2015-02-15: 30 mg via INTRAVENOUS
  Filled 2015-02-15: qty 1

## 2015-02-15 MED ORDER — PROCHLORPERAZINE EDISYLATE 5 MG/ML IJ SOLN
10.0000 mg | Freq: Once | INTRAMUSCULAR | Status: AC
  Administered 2015-02-15: 10 mg via INTRAVENOUS
  Filled 2015-02-15: qty 2

## 2015-02-15 MED ORDER — DIPHENHYDRAMINE HCL 50 MG/ML IJ SOLN
25.0000 mg | Freq: Once | INTRAMUSCULAR | Status: AC
  Administered 2015-02-15: 25 mg via INTRAVENOUS
  Filled 2015-02-15: qty 1

## 2015-02-15 MED ORDER — OXYCODONE-ACETAMINOPHEN 5-325 MG PO TABS
1.0000 | ORAL_TABLET | Freq: Once | ORAL | Status: AC
Start: 2015-02-15 — End: 2015-02-15
  Administered 2015-02-15: 1 via ORAL
  Filled 2015-02-15: qty 1

## 2015-02-15 MED ORDER — PROCHLORPERAZINE MALEATE 10 MG PO TABS: 10.0000 mg | ORAL_TABLET | Freq: Three times a day (TID) | ORAL | Status: DC | PRN

## 2015-02-15 MED ORDER — ONDANSETRON HCL 4 MG/2ML IJ SOLN
4.0000 mg | Freq: Once | INTRAMUSCULAR | Status: AC
  Administered 2015-02-15: 4 mg via INTRAVENOUS
  Filled 2015-02-15: qty 2

## 2015-02-15 MED ORDER — DIPHENHYDRAMINE HCL 25 MG PO TABS: 25.0000 mg | ORAL_TABLET | Freq: Four times a day (QID) | ORAL | Status: DC | PRN

## 2015-02-15 NOTE — ED Notes (Signed)
Pt states yesterday her legs began to bother her, woke up this morning with a headache. Thought she had HTN so took BP meds.

## 2015-02-15 NOTE — ED Provider Notes (Signed)
CSN: [id number]     Arrival date & time 02/15/15  1024 History   First MD Initiated Contact with Patient 02/15/15 1032     Chief Complaint  Patient presents with  . Hypertension  . Leg Pain     (Consider location/radiation/quality/duration/timing/severity/associated sxs/prior Treatment) HPI Comments: 59 year old female with extensive past medical history including COPD, colon cancer, OSA, hypertension, DVT on warfarin who presents with headache. The patient states that 2 weeks ago she began having a headache that became severe but it eventually resolved spontaneously. This morning, she woke up with a pressure sensation in her head which gradually developed into a headache that is now severe, left-sided, and associated with blurry vision and photophobia. She denies any sudden onset of headache. No neck stiffness. No rash or fevers. No vomiting or abdominal pain. Patient also reports that yesterday both of her legs began hurting her and she is now having muscle spasms in her back down into her legs.  Patient is a 59 y.o. female presenting with hypertension and leg pain. The history is provided by the patient.  Hypertension  Leg Pain   Past Medical History  Diagnosis Date  . Hypertension   . Asthma   . COPD (chronic obstructive pulmonary disease)   . Anxiety   . Cancer   . Colon cancer   . Dyslipidemia   . Migraine headache   . Chronic bronchitis   . Uterine fibroid   . Ovarian cyst   . GERD (gastroesophageal reflux disease)   . Morbid obesity   . Obstructive sleep apnea     mild  . Chronic pain   . Chronic back pain   . Arthritis   . Osteoarthritis   . Depression   . DVT (deep vein thrombosis) in pregnancy    Past Surgical History  Procedure Laterality Date  . Knee arthroscopy      bil.  . Carpal tunnel release      rt  . Mouth surgery      teeth extraction  . Cesarean section    . Subtotal colectomy    . Colonoscopy    . Tubal ligation     Family History   Problem Relation Age of Onset  . Uterine cancer Mother   . Stroke Father   . Colon cancer Maternal Uncle   . Diabetes Father   . Ovarian cancer Mother   . Hypertension Father   . Breast cancer Maternal Grandmother   . Diabetes Sister   . Asthma Child   . Asthma Child    Social History  Substance Use Topics  . Smoking status: Current Some Day Smoker -- 1.00 packs/day for 37 years    Types: Cigarettes  . Smokeless tobacco: Never Used     Comment: trying to quit, down to .5ppd X2 years ago.   . Alcohol Use: No   OB History    Gravida Para Term Preterm AB TAB SAB Ectopic Multiple Living   _0 Review of Systems  10 Systems reviewed and are negative for acute change except as noted in the HPI.   Allergies  Kiwi extract and Aspirin  Home Medications   Prior to Admission medications   Medication Sig Start Date End Date Taking? Authorizing Provider  acetaminophen (TYLENOL) 500 MG tablet Take 500 mg by mouth every 6 (six) hours as needed for headache.   Yes Historical Provider, MD  albuterol (PROVENTIL HFA;VENTOLIN HFA)  108 (90 BASE) MCG/ACT inhaler Inhale 2 puffs into the lungs every 6 (six) hours as needed for wheezing or shortness of breath (shortness of breath). 02/20/14  Yes Oswald Hillock, MD  B Complex Vitamins (VITAMIN-B COMPLEX PO) Take 1 tablet by mouth daily.   Yes Historical Provider, MD  furosemide (LASIX) 20 MG tablet Take 1 tablet (20 mg total) by mouth daily. 08/25/14  Yes Lorayne Marek, MD  gabapentin (NEURONTIN) 300 MG capsule Take 1 capsule (300 mg total) by mouth at bedtime. 10/18/14  Yes Melony Overly, MD  HYDROcodone-acetaminophen (NORCO/VICODIN) 5-325 MG per tablet Take 1 tablet by mouth every 6 (six) hours as needed for moderate pain. 11/21/14  Yes Rosemarie Ax, MD  LORazepam (ATIVAN) 1 MG tablet Take 1 tablet (1 mg total) by mouth every 6 (six) hours as needed for anxiety (anxiety). 08/25/14  Yes Lorayne Marek, MD  omeprazole (PRILOSEC) 40 MG  capsule Take 1 capsule (40 mg total) by mouth daily. 08/25/14  Yes Lorayne Marek, MD  potassium chloride SA (K-DUR,KLOR-CON) 20 MEQ tablet Take 1 tablet (20 mEq total) by mouth daily. 08/25/14  Yes Lorayne Marek, MD  warfarin (COUMADIN) 5 MG tablet Take 1 tablet (5 mg total) by mouth daily. 08/25/14  Yes Lorayne Marek, MD  cyclobenzaprine (FLEXERIL) 10 MG tablet Take 1 tablet (10 mg total) by mouth 2 (two) times daily as needed for muscle spasms. 10/26/14   Evelina Bucy, MD  diphenhydrAMINE (BENADRYL) 25 MG tablet Take 1 tablet (25 mg total) by mouth every 6 (six) hours as needed (at first sign of headache). 02/15/15   Sharlett Iles, MD  oxyCODONE-acetaminophen (PERCOCET) 5-325 MG per tablet Take 1 tablet by mouth every 6 (six) hours as needed for moderate pain. 10/26/14   Evelina Bucy, MD  prochlorperazine (COMPAZINE) 10 MG tablet Take 1 tablet (10 mg total) by mouth every 8 (eight) hours as needed for nausea or vomiting (Nausea). 02/15/15   Wenda Overland Little, MD   BP 140/88 mmHg  Pulse 81  Temp(Src) 98.2 F (36.8 C) (Oral)  Resp 15  SpO2 100%  LMP 05/24/2003 Physical Exam  Constitutional: She is oriented to person, place, and time. She appears well-developed and well-nourished. No distress.  HENT:  Head: Normocephalic and atraumatic.  Mouth/Throat: Oropharynx is clear and moist.  Moist mucous membranes  Eyes: Conjunctivae are normal. Pupils are equal, round, and reactive to light.  Neck: Neck supple.  Cardiovascular: Normal rate, regular rhythm and normal heart sounds.   No murmur heard. Pulmonary/Chest: Effort normal and breath sounds normal.  Abdominal: Soft. Bowel sounds are normal. She exhibits no distension. There is no tenderness.  Musculoskeletal: She exhibits no edema.  Neurological: She is alert and oriented to person, place, and time. She has normal reflexes. No cranial nerve deficit. She exhibits normal muscle tone.  Fluent speech, 5/5 strength and normal sensation  throughout, no pronator drift, normal finger to nose testing  Skin: Skin is warm and dry.  Psychiatric: She has a normal mood and affect. Judgment normal.  Nursing note and vitals reviewed.   ED Course  Procedures (including critical care time) Labs Review Labs Reviewed  CBC WITH DIFFERENTIAL/PLATELET - Abnormal; Notable for the following:    Hemoglobin 16.5 (*)    HCT 49.1 (*)    All other components within normal limits  COMPREHENSIVE METABOLIC PANEL - Abnormal; Notable for the following:    Glucose, Bld 122 (*)    Total Protein 8.9 (*)    All  other components within normal limits  PROTIME-INR - Abnormal; Notable for the following:    Prothrombin Time 17.8 (*)    All other components within normal limits  SEDIMENTATION RATE    Imaging Review Ct Head Wo Contrast  02/15/2015   CLINICAL DATA:  Intermittent left side headache for 2 weeks, blurred vision  EXAM: CT HEAD WITHOUT CONTRAST  TECHNIQUE: Contiguous axial images were obtained from the base of the skull through the vertex without intravenous contrast.  COMPARISON:  10/13/2014  FINDINGS: No skull fracture is noted. No intracranial hemorrhage, mass effect or midline shift. No hydrocephalus. Paranasal sinuses and mastoid air cells are unremarkable. No intra or extra-axial fluid collection. No acute cortical infarction. No mass lesion is noted on this unenhanced scan.  IMPRESSION: No acute intracranial abnormality.  No significant change.   Electronically Signed   By: Lahoma Crocker M.D.   On: 02/15/2015 12:54   I have personally reviewed and evaluated these lab results as part of my medical decision-making.   EKG Interpretation None     Medications  fentaNYL (SUBLIMAZE) injection 50 mcg (50 mcg Intravenous Given 02/15/15 1213)  ondansetron (ZOFRAN) injection 4 mg (4 mg Intravenous Given 02/15/15 1213)  diphenhydrAMINE (BENADRYL) injection 25 mg (25 mg Intravenous Given 02/15/15 1214)  prochlorperazine (COMPAZINE) injection 10 mg (10 mg  Intravenous Given 02/15/15 1214)  oxyCODONE-acetaminophen (PERCOCET/ROXICET) 5-325 MG per tablet 1 tablet (1 tablet Oral Given 02/15/15 1212)  diphenhydrAMINE (BENADRYL) injection 25 mg (25 mg Intravenous Given 02/15/15 1408)  prochlorperazine (COMPAZINE) injection 10 mg (10 mg Intravenous Given 02/15/15 1408)  ketorolac (TORADOL) 30 MG/ML injection 30 mg (30 mg Intravenous Given 02/15/15 1408)    MDM   Final diagnoses:  Nonintractable headache, unspecified chronicity pattern, unspecified headache type   60 year old female who presents with gradual onset of headache this morning as well as bilateral leg pain and back spasms which she has had chronically. Patient uncomfortable at presentation but with reassuring vital signs. Patient was normotensive during my examination. Neurologic exam normal. Because of the patient's complaint of left frontotemporal headache associated with blurry vision, obtained labs including ESR. Also check the patient's INR given her Coumadin use. Because of patient's h/o cancer and anticoagulant use, obtained CT head to r/o intracranial process. Gave IV benadryl, compazine, home dose of percocet.  Labwork notable only for subtherapeutic INR at 1.46. Head CT negative for acute process. Later gave the patient Toradol and repeat doses of Benadryl and Compazine. On reexamination after these medications, the patient reported that she felt better. I informed her of her INR results and instructed her to follow-up with her physician ASAP to adjust her warfarin.  The patient denies any neurologic symptoms such as visual changes, focal numbness/weakness, balance problems, confusion, or speech difficulty to suggest a life-threatening intracranial process such as intracranial hemorrhage or mass. The patient has no clotting risk factors thus venous sinus thrombosis is unlikely.  I feel that the patient is safe for discharge home without any further head imaging at this time. I have reviewed return  precautions including development of neurologic symptoms, confusion, lethargy, or difficulty speaking and patient has voiced understanding. Gave patient prescriptions for Compazine and Benadryl to use at home at the first sign of headache. Patient voiced understanding of need for follow-up and discharged in satisfactory condition.   Sharlett Iles, MD 02/15/15 [phone]

## 2015-02-15 NOTE — Discharge Instructions (Signed)
PLEASE FOLLOW UP AS SOON AS POSSIBLE WITH YOUR PRIMARY CARE PROVIDER TO ADJUST YOUR WARFARIN. YOUR INR TODAY WAS LOW AT 1.4.

## 2015-04-01 IMAGING — MR MR CERVICAL SPINE W/O CM
4 of 5 series · 26 of 48 positions shown · non-contrast
Comparison: Prior MRI from 12/25/2004

CLINICAL DATA: Neck and bilateral arm pain

EXAM:
MRI CERVICAL SPINE WITHOUT CONTRAST
TECHNIQUE: Multiplanar, multisequence MR imaging was performed. No intravenous
contrast was administered.

[Series 5: T1 · sagittal · 3.3mm · 0.37mm/px · 6 of 12 slices shown]
[im 1/12]
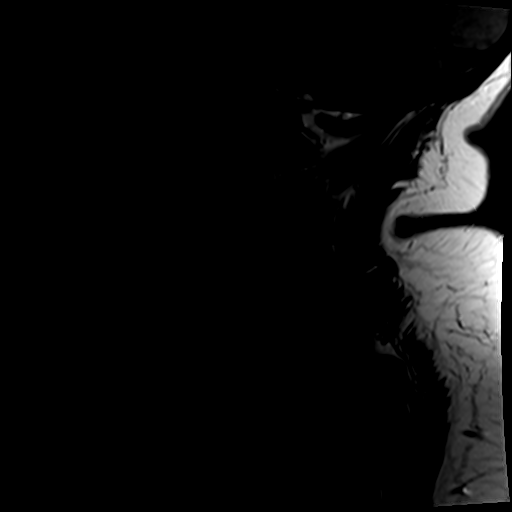
[im 3/12]
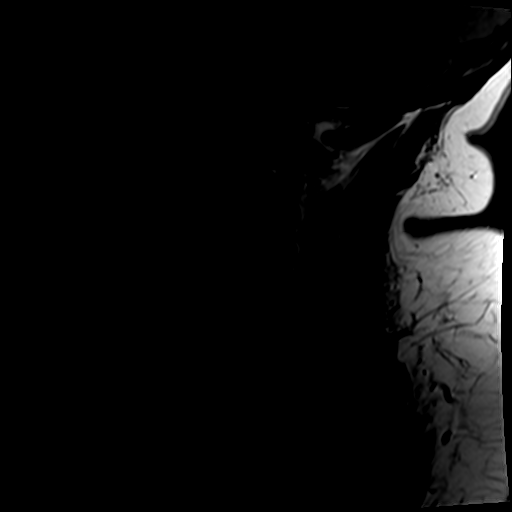
[im 5/12]
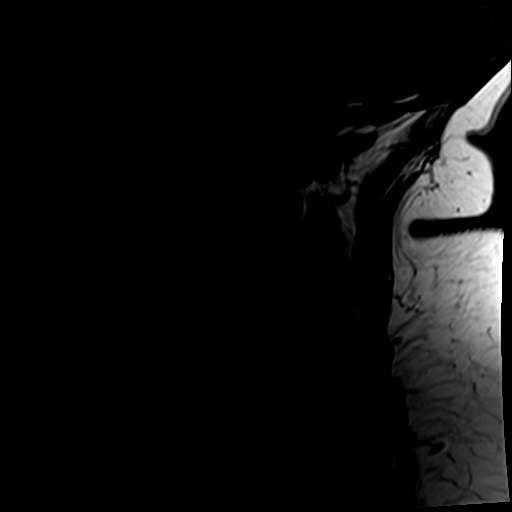
[im 7/12]
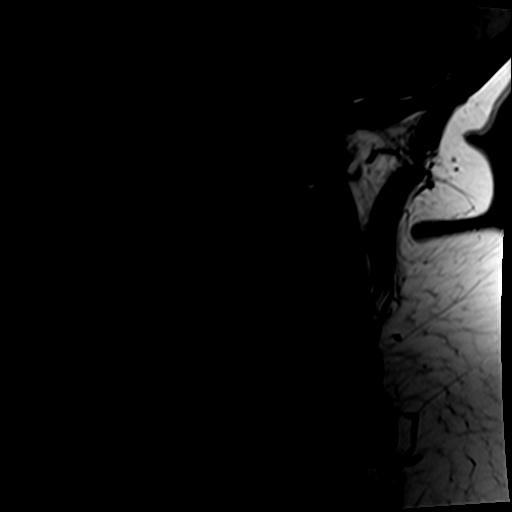
[im 9/12]
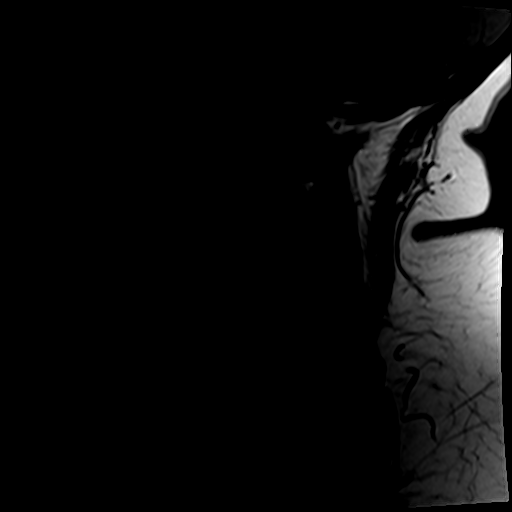
[im 12/12]
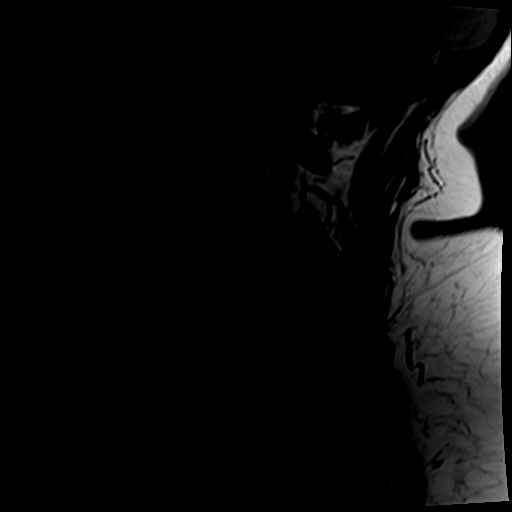

[Series 6: STIR · sagittal · 3.3mm · 0.49mm/px · 5 of 12 slices shown]
[im 1/12]
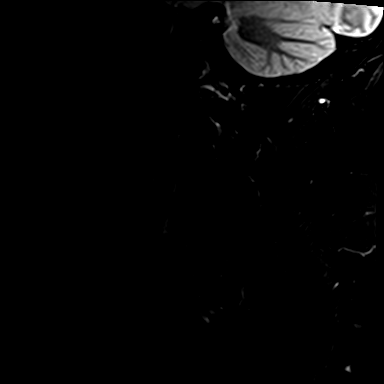
[im 2/12]
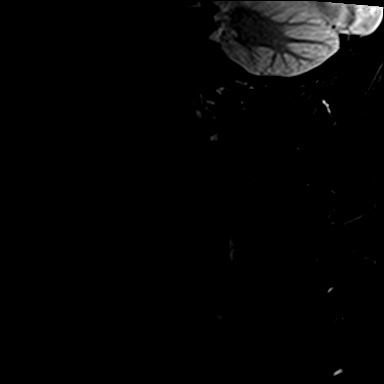
[im 4/12]
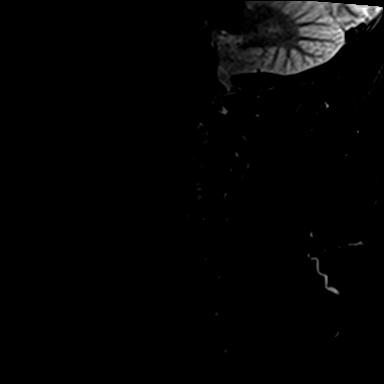
[im 6/12]
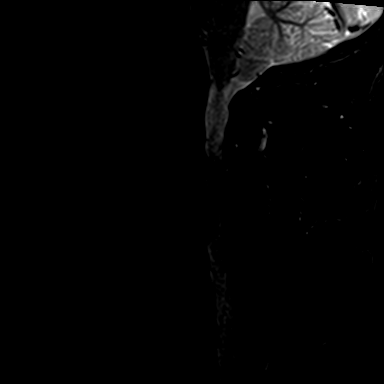
[im 10/12]
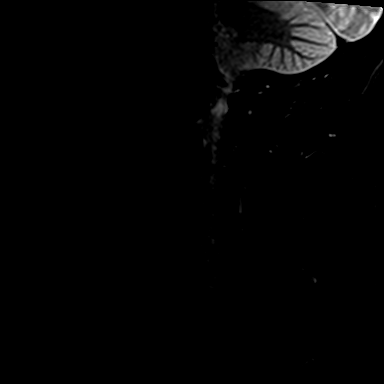

[Series 8: T2 · axial · 3.0mm · 0.74mm/px · z∈[-115,-27]mm · 8 of 24 slices shown (1 of 2)]
[im 1/24]
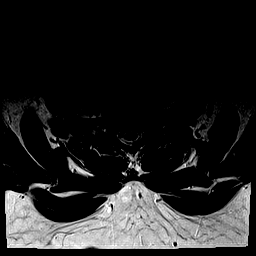
[im 4/24]
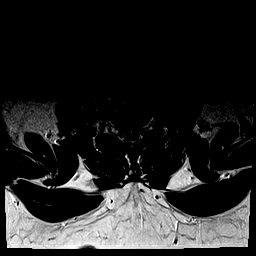
[im 8/24]
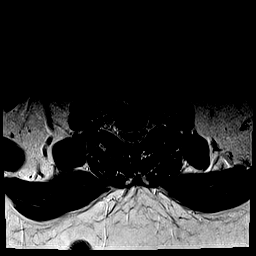
[im 11/24]
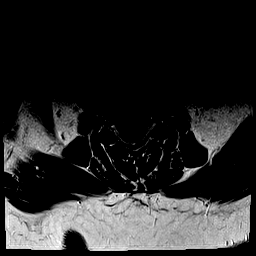
[im 13/24]
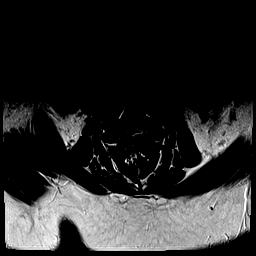
[im 16/24]
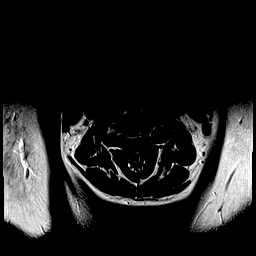
[im 20/24]
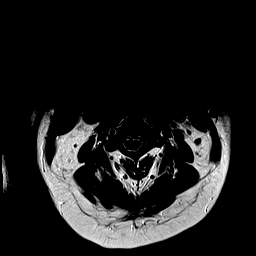
[im 24/24]
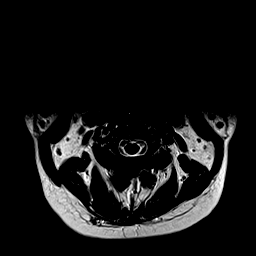

[Series 9: T2 · sagittal · 3.3mm · 0.37mm/px · 7 of 12 slices shown (2 of 2)]
[im 1/12]
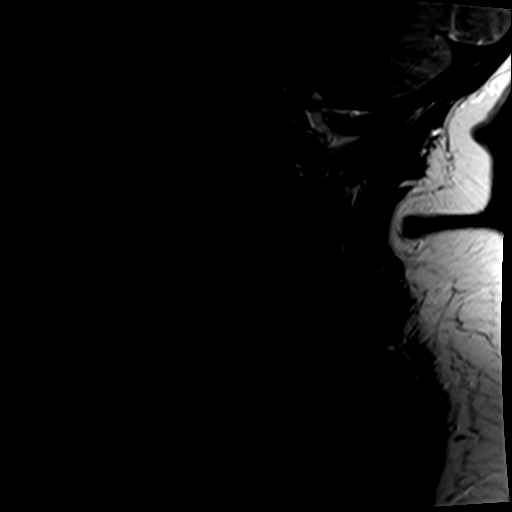
[im 2/12]
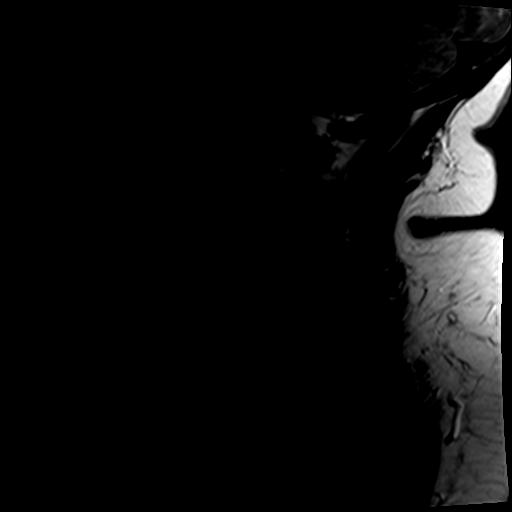
[im 4/12]
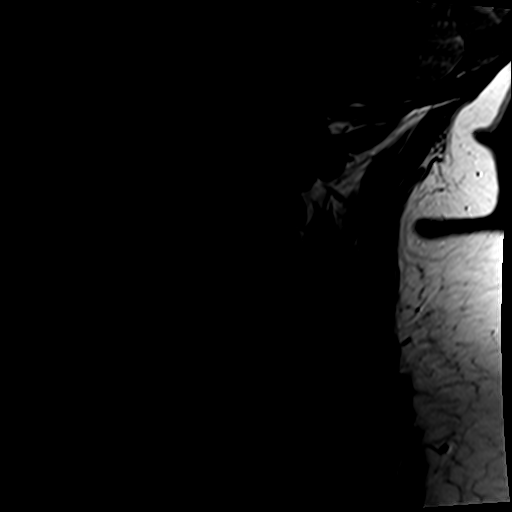
[im 6/12]
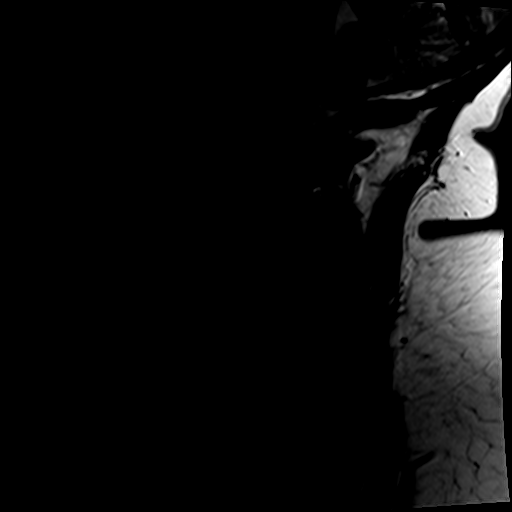
[im 8/12]
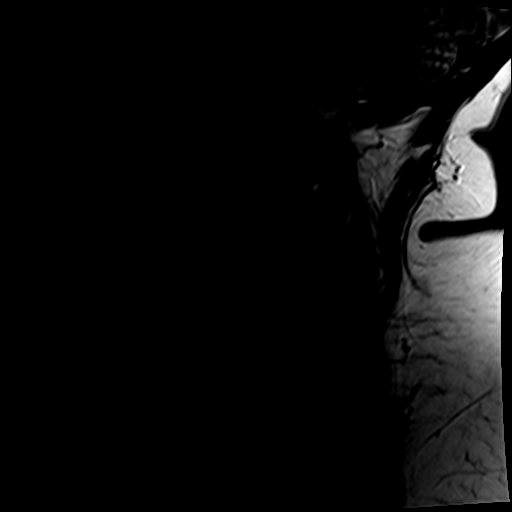
[im 10/12]
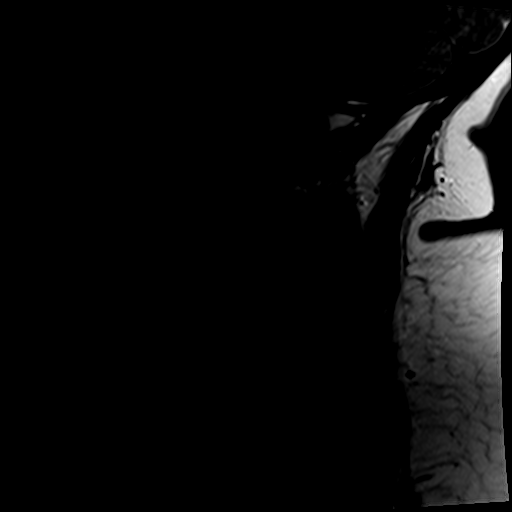
[im 12/12]
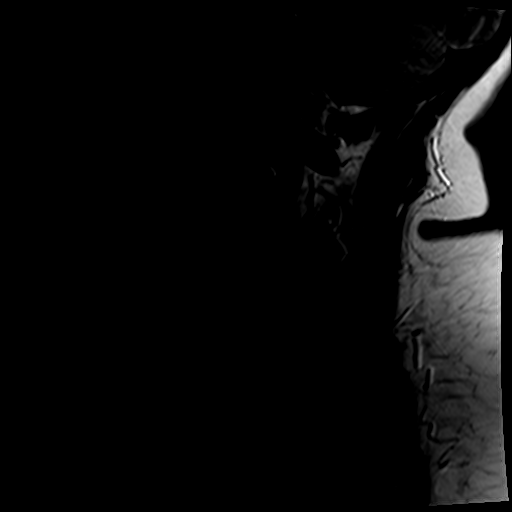

[26 of 48 positions shown; findings below may reference images not displayed]

FINDINGS: The visualized portions of the brain and posterior fossa are within
normal limits. Mild cerebellar tonsillar ectopia of approximately 3
mm is noted, unchanged. Craniocervical junction is widely patent.

There is straightening of the normal cervical lordosis. No
listhesis. Vertebral body heights are preserved. Signal intensity
within the vertebral body bone marrow and spinal cord is normal.

At C2-3, there is minimal posterior disc bulge indenting the ventral
thecal sac without significant canal stenosis. No foraminal
narrowing.

At C3-4, there is broad-based shallow disc bulging with
predominately left-sided uncovertebral osteophytosis and facet
arthrosis. The disc bulge is slightly eccentric to the left. There
is resultant mild left neural foraminal narrowing. Posterior disc
bulging partially effaces the ventral thecal sac with resultant mild
canal stenosis.

At C4-5, there is diffuse degenerative disc osteophyte with mild
bilateral facet arthrosis and uncovertebral osteophytosis. A
superimposed right paracentral disc protrusion indents and flattens
the ventral thecal sac and flattens the right aspect of the spinal
cord. There is moderate canal stenosis at this level. There is
moderate left with mild right foraminal narrowing. These findings
are similar relative to previous examination.

At C5-6, there is shallow diffuse disc bulge, slightly asymmetric to
the right, partially effacing the ventral thecal sac without
significant canal stenosis. There is mild right neural foraminal
narrowing.

At C6-7, there is no significant canal or neural foraminal stenosis.

At C7-T1, there is no canal or neural foraminal stenosis.

The visualized soft tissues of the neck are within normal limits.
IMPRESSION: 1. Right paracentral disc protrusion at C4-5 resulting in moderate
canal stenosis. There is moderate left with mild right foraminal
narrowing at this level as well. Overall, these findings are
slightly worsened relative to previous MRI from [DATE]. Shallow disc bulging at C3-4 with predominant left-sided
uncovertebral osteophytosis and facet arthrosis resulting and mild
canal and left neural foraminal stenosis.
3. Shallow disc bulge at C5-6 with resultant mild right neural
foraminal narrowing.
4. Additional degenerative changes as above.
5. Mild cerebellar tonsillar ectopia of approximately 3 mm,
unchanged.

## 2015-04-21 ENCOUNTER — Encounter (HOSPITAL_COMMUNITY): Payer: Self-pay | Admitting: *Deleted

## 2015-04-21 ENCOUNTER — Observation Stay (HOSPITAL_COMMUNITY)
Admission: EM | Admit: 2015-04-21 | Discharge: 2015-04-23 | Disposition: A | Discharge status: Home / Self Care | Payer: Medicare HMO | Attending: Internal Medicine | Admitting: Internal Medicine

## 2015-04-21 ENCOUNTER — Emergency Department (HOSPITAL_COMMUNITY): Payer: Medicare HMO

## 2015-04-21 DIAGNOSIS — F17210 Nicotine dependence, cigarettes, uncomplicated: Secondary | ICD-10-CM | POA: Insufficient documentation

## 2015-04-21 DIAGNOSIS — K219 Gastro-esophageal reflux disease without esophagitis: Secondary | ICD-10-CM | POA: Insufficient documentation

## 2015-04-21 DIAGNOSIS — G4733 Obstructive sleep apnea (adult) (pediatric): Secondary | ICD-10-CM | POA: Diagnosis not present

## 2015-04-21 DIAGNOSIS — J449 Chronic obstructive pulmonary disease, unspecified: Secondary | ICD-10-CM | POA: Insufficient documentation

## 2015-04-21 DIAGNOSIS — Y9389 Activity, other specified: Secondary | ICD-10-CM | POA: Insufficient documentation

## 2015-04-21 DIAGNOSIS — Z6841 Body Mass Index (BMI) 40.0 and over, adult: Secondary | ICD-10-CM | POA: Insufficient documentation

## 2015-04-21 DIAGNOSIS — G894 Chronic pain syndrome: Secondary | ICD-10-CM | POA: Insufficient documentation

## 2015-04-21 DIAGNOSIS — Z86718 Personal history of other venous thrombosis and embolism: Secondary | ICD-10-CM | POA: Insufficient documentation

## 2015-04-21 DIAGNOSIS — Z7901 Long term (current) use of anticoagulants: Secondary | ICD-10-CM | POA: Insufficient documentation

## 2015-04-21 DIAGNOSIS — F10129 Alcohol abuse with intoxication, unspecified: Secondary | ICD-10-CM | POA: Insufficient documentation

## 2015-04-21 DIAGNOSIS — Z85038 Personal history of other malignant neoplasm of large intestine: Secondary | ICD-10-CM | POA: Insufficient documentation

## 2015-04-21 DIAGNOSIS — J45909 Unspecified asthma, uncomplicated: Secondary | ICD-10-CM | POA: Diagnosis not present

## 2015-04-21 DIAGNOSIS — Y998 Other external cause status: Secondary | ICD-10-CM | POA: Diagnosis not present

## 2015-04-21 DIAGNOSIS — G43909 Migraine, unspecified, not intractable, without status migrainosus: Secondary | ICD-10-CM | POA: Diagnosis not present

## 2015-04-21 DIAGNOSIS — F329 Major depressive disorder, single episode, unspecified: Secondary | ICD-10-CM | POA: Diagnosis not present

## 2015-04-21 DIAGNOSIS — Z886 Allergy status to analgesic agent status: Secondary | ICD-10-CM | POA: Diagnosis not present

## 2015-04-21 DIAGNOSIS — R4182 Altered mental status, unspecified: Secondary | ICD-10-CM | POA: Diagnosis present

## 2015-04-21 DIAGNOSIS — M549 Dorsalgia, unspecified: Secondary | ICD-10-CM | POA: Diagnosis not present

## 2015-04-21 DIAGNOSIS — Y9289 Other specified places as the place of occurrence of the external cause: Secondary | ICD-10-CM | POA: Insufficient documentation

## 2015-04-21 DIAGNOSIS — M1990 Unspecified osteoarthritis, unspecified site: Secondary | ICD-10-CM | POA: Insufficient documentation

## 2015-04-21 DIAGNOSIS — I10 Essential (primary) hypertension: Secondary | ICD-10-CM | POA: Insufficient documentation

## 2015-04-21 DIAGNOSIS — I82401 Acute embolism and thrombosis of unspecified deep veins of right lower extremity: Secondary | ICD-10-CM

## 2015-04-21 DIAGNOSIS — E785 Hyperlipidemia, unspecified: Secondary | ICD-10-CM | POA: Insufficient documentation

## 2015-04-21 DIAGNOSIS — J4520 Mild intermittent asthma, uncomplicated: Secondary | ICD-10-CM | POA: Diagnosis not present

## 2015-04-21 DIAGNOSIS — R05 Cough: Secondary | ICD-10-CM | POA: Insufficient documentation

## 2015-04-21 DIAGNOSIS — I959 Hypotension, unspecified: Secondary | ICD-10-CM | POA: Diagnosis not present

## 2015-04-21 DIAGNOSIS — Z23 Encounter for immunization: Secondary | ICD-10-CM | POA: Diagnosis not present

## 2015-04-21 DIAGNOSIS — R000 Tachycardia, unspecified: Secondary | ICD-10-CM | POA: Insufficient documentation

## 2015-04-21 DIAGNOSIS — F419 Anxiety disorder, unspecified: Secondary | ICD-10-CM | POA: Diagnosis not present

## 2015-04-21 DIAGNOSIS — Z91018 Allergy to other foods: Secondary | ICD-10-CM | POA: Insufficient documentation

## 2015-04-21 DIAGNOSIS — I82409 Acute embolism and thrombosis of unspecified deep veins of unspecified lower extremity: Secondary | ICD-10-CM | POA: Diagnosis present

## 2015-04-21 DIAGNOSIS — Z79899 Other long term (current) drug therapy: Secondary | ICD-10-CM | POA: Diagnosis not present

## 2015-04-21 DIAGNOSIS — R400 Somnolence: Secondary | ICD-10-CM | POA: Diagnosis not present

## 2015-04-21 HISTORY — DX: Acute embolism and thrombosis of unspecified deep veins of unspecified lower extremity: I82.409

## 2015-04-21 LAB — CBC WITH DIFFERENTIAL/PLATELET
BASOS PCT: 0 %
Basophils Absolute: 0 10*3/uL (ref 0.0–0.1)
Eosinophils Absolute: 0.1 10*3/uL (ref 0.0–0.7)
Eosinophils Relative: 3 %
HCT: 45.5 % (ref 36.0–46.0)
HEMOGLOBIN: 15 g/dL (ref 12.0–15.0)
LYMPHS ABS: 2.1 10*3/uL (ref 0.7–4.0)
Lymphocytes Relative: 38 %
MCH: 32.5 pg (ref 26.0–34.0)
MCHC: 33 g/dL (ref 30.0–36.0)
MCV: 98.7 fL (ref 78.0–100.0)
MONOS PCT: 18 %
Monocytes Absolute: 1 10*3/uL (ref 0.1–1.0)
NEUTROS ABS: 2.3 10*3/uL (ref 1.7–7.7)
NEUTROS PCT: 41 %
Platelets: 192 10*3/uL (ref 150–400)
RBC: 4.61 MIL/uL (ref 3.87–5.11)
RDW: 13.9 % (ref 11.5–15.5)
WBC: 5.5 10*3/uL (ref 4.0–10.5)

## 2015-04-21 LAB — CBG MONITORING, ED: Glucose-Capillary: 92 mg/dL (ref 65–99)

## 2015-04-21 LAB — RAPID URINE DRUG SCREEN, HOSP PERFORMED
AMPHETAMINES: NOT DETECTED
BARBITURATES: NOT DETECTED
Benzodiazepines: POSITIVE — AB
COCAINE: NOT DETECTED
Opiates: POSITIVE — AB
TETRAHYDROCANNABINOL: NOT DETECTED

## 2015-04-21 LAB — COMPREHENSIVE METABOLIC PANEL
ALT: 8 U/L — ABNORMAL LOW (ref 14–54)
AST: 14 U/L — ABNORMAL LOW (ref 15–41)
Albumin: 3.5 g/dL (ref 3.5–5.0)
Alkaline Phosphatase: 74 U/L (ref 38–126)
Anion gap: 9 (ref 5–15)
BILIRUBIN TOTAL: 0.4 mg/dL (ref 0.3–1.2)
BUN: 10 mg/dL (ref 6–20)
CHLORIDE: 105 mmol/L (ref 101–111)
CO2: 25 mmol/L (ref 22–32)
CREATININE: 0.9 mg/dL (ref 0.44–1.00)
Calcium: 8.5 mg/dL — ABNORMAL LOW (ref 8.9–10.3)
Glucose, Bld: 86 mg/dL (ref 65–99)
POTASSIUM: 3.7 mmol/L (ref 3.5–5.1)
Sodium: 139 mmol/L (ref 135–145)
TOTAL PROTEIN: 6.8 g/dL (ref 6.5–8.1)

## 2015-04-21 LAB — ETHANOL: Alcohol, Ethyl (B): 62 mg/dL — ABNORMAL HIGH (ref <5)

## 2015-04-21 LAB — SALICYLATE LEVEL

## 2015-04-21 LAB — ACETAMINOPHEN LEVEL: Acetaminophen (Tylenol), Serum: <10 ug/mL — ABNORMAL LOW (ref 10–30)

## 2015-04-21 LAB — MAGNESIUM: MAGNESIUM: 1.9 mg/dL (ref 1.7–2.4)

## 2015-04-21 MED ORDER — WARFARIN SODIUM 5 MG PO TABS
5.0000 mg | ORAL_TABLET | Freq: Every day | ORAL | Status: DC
  Administered 2015-04-22: 5 mg via ORAL
  Filled 2015-04-21 (×2): qty 1

## 2015-04-21 MED ORDER — IBUPROFEN 200 MG PO TABS
600.0000 mg | ORAL_TABLET | Freq: Four times a day (QID) | ORAL | Status: DC | PRN
  Administered 2015-04-22 – 2015-04-23 (×2): 600 mg via ORAL
  Filled 2015-04-21 (×2): qty 3

## 2015-04-21 MED ORDER — NALOXONE HCL 2 MG/2ML IJ SOSY
1.0000 mg | PREFILLED_SYRINGE | Freq: Once | INTRAMUSCULAR | Status: AC
  Administered 2015-04-21: 1 mg via INTRAVENOUS
  Filled 2015-04-21: qty 2

## 2015-04-21 MED ORDER — SODIUM CHLORIDE 0.9 % IV BOLUS (SEPSIS)
1000.0000 mL | Freq: Once | INTRAVENOUS | Status: AC
  Administered 2015-04-21: 1000 mL via INTRAVENOUS

## 2015-04-21 MED ORDER — ALBUTEROL SULFATE (2.5 MG/3ML) 0.083% IN NEBU: 2.5000 mg | INHALATION_SOLUTION | RESPIRATORY_TRACT | Status: DC | PRN

## 2015-04-21 MED ORDER — SODIUM CHLORIDE 0.9 % IJ SOLN
3.0000 mL | Freq: Two times a day (BID) | INTRAMUSCULAR | Status: DC
  Administered 2015-04-22 – 2015-04-23 (×4): 3 mL via INTRAVENOUS

## 2015-04-21 MED ORDER — PANTOPRAZOLE SODIUM 40 MG PO TBEC
40.0000 mg | DELAYED_RELEASE_TABLET | Freq: Every day | ORAL | Status: DC
  Administered 2015-04-22 – 2015-04-23 (×2): 40 mg via ORAL
  Filled 2015-04-21 (×2): qty 1

## 2015-04-21 MED ORDER — CYCLOBENZAPRINE HCL 10 MG PO TABS
10.0000 mg | ORAL_TABLET | Freq: Two times a day (BID) | ORAL | Status: DC | PRN
  Administered 2015-04-23: 10 mg via ORAL
  Filled 2015-04-21: qty 1

## 2015-04-21 MED ORDER — POTASSIUM CHLORIDE CRYS ER 20 MEQ PO TBCR
20.0000 meq | EXTENDED_RELEASE_TABLET | Freq: Every day | ORAL | Status: DC
  Administered 2015-04-22 – 2015-04-23 (×2): 20 meq via ORAL
  Filled 2015-04-21 (×2): qty 1

## 2015-04-21 MED ORDER — IPRATROPIUM-ALBUTEROL 0.5-2.5 (3) MG/3ML IN SOLN
3.0000 mL | Freq: Once | RESPIRATORY_TRACT | Status: AC
  Administered 2015-04-22: 3 mL via RESPIRATORY_TRACT
  Filled 2015-04-21: qty 3

## 2015-04-21 MED ORDER — FUROSEMIDE 20 MG PO TABS
20.0000 mg | ORAL_TABLET | Freq: Every day | ORAL | Status: DC
  Administered 2015-04-22 – 2015-04-23 (×2): 20 mg via ORAL
  Filled 2015-04-21 (×2): qty 1

## 2015-04-21 MED ORDER — CITALOPRAM HYDROBROMIDE 20 MG PO TABS
20.0000 mg | ORAL_TABLET | Freq: Every day | ORAL | Status: DC
  Administered 2015-04-22 – 2015-04-23 (×2): 20 mg via ORAL
  Filled 2015-04-21 (×2): qty 1

## 2015-04-21 MED ORDER — NALOXONE HCL 0.4 MG/ML IJ SOLN: 0.4000 mg | INTRAMUSCULAR | Status: DC | PRN

## 2015-04-21 MED ORDER — AMITRIPTYLINE HCL 50 MG PO TABS
50.0000 mg | ORAL_TABLET | Freq: Every day | ORAL | Status: DC
  Administered 2015-04-22: 50 mg via ORAL
  Filled 2015-04-21 (×3): qty 1

## 2015-04-21 NOTE — ED Notes (Signed)
rn took pts bag out to nurse station so that patient could not potentiall take more medications, in bag pt has   prescription with her name Brooke [name] for oxycodon-acetaminophen 7.5-3.25  She had 4 lazanda/fentanyl nasal spray bottles with no prescription label, and a alprazolam 0.5mg  bottle with the name Brooke [name] on it

## 2015-04-21 NOTE — ED Notes (Signed)
md at bedside

## 2015-04-21 NOTE — ED Notes (Signed)
Bed: [id number] Expected date:  Expected time:  Means of arrival:  Comments: Ems- OD
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
## 2015-04-21 NOTE — ED Notes (Signed)
md made aware of pts low blood pressure. Pt arousable with verbal and physical stimulation, falls asleep quickly.

## 2015-04-21 NOTE — ED Notes (Signed)
Pt now only opening eyes with verbal stimulation, slurring words. md made aware. Respirations 15/min will monitor.
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
## 2015-04-21 NOTE — ED Notes (Signed)
Pt ambulated to bathroom with out assistance Rn notified

## 2015-04-21 NOTE — ED Notes (Signed)
Pt. On monitor. 

## 2015-04-21 NOTE — ED Notes (Signed)
Notified RN,Faith, pt. CBG 92.

## 2015-04-21 NOTE — ED Notes (Signed)
Faith-Marie drew labs

## 2015-04-22 DIAGNOSIS — J4520 Mild intermittent asthma, uncomplicated: Secondary | ICD-10-CM

## 2015-04-22 DIAGNOSIS — I10 Essential (primary) hypertension: Secondary | ICD-10-CM | POA: Diagnosis not present

## 2015-04-22 DIAGNOSIS — R400 Somnolence: Secondary | ICD-10-CM | POA: Diagnosis not present

## 2015-04-22 LAB — BLOOD GAS, VENOUS
ACID-BASE DEFICIT: 0.5 mmol/L (ref 0.0–2.0)
BICARBONATE: 24.4 meq/L — AB (ref 20.0–24.0)
Drawn by: 365911
FIO2: 0.21
O2 Saturation: 79.7 %
PCO2 VEN: 43 mmHg — AB (ref 45.0–50.0)
Patient temperature: 98.4
TCO2: 21.8 mmol/L (ref 0–100)
pH, Ven: 7.371 — ABNORMAL HIGH (ref 7.250–7.300)
pO2, Ven: 44.2 mmHg (ref 30.0–45.0)

## 2015-04-22 LAB — PROTIME-INR
INR: 2.24 — ABNORMAL HIGH (ref 0.00–1.49)
Prothrombin Time: 24.5 s — ABNORMAL HIGH (ref 11.6–15.2)

## 2015-04-22 MED ORDER — IPRATROPIUM-ALBUTEROL 0.5-2.5 (3) MG/3ML IN SOLN
3.0000 mL | Freq: Four times a day (QID) | RESPIRATORY_TRACT | Status: DC
  Administered 2015-04-22 – 2015-04-23 (×2): 3 mL via RESPIRATORY_TRACT
  Filled 2015-04-22 (×3): qty 3

## 2015-04-22 MED ORDER — WARFARIN - PHYSICIAN DOSING INPATIENT
Freq: Every day | Status: DC
  Administered 2015-04-22: 18:00:00

## 2015-04-22 MED ORDER — OXYCODONE-ACETAMINOPHEN 7.5-325 MG PO TABS
1.0000 | ORAL_TABLET | Freq: Four times a day (QID) | ORAL | Status: DC | PRN
  Administered 2015-04-22 – 2015-04-23 (×3): 1 via ORAL
  Filled 2015-04-22 (×3): qty 1

## 2015-04-22 MED ORDER — NICOTINE 21 MG/24HR TD PT24
21.0000 mg | MEDICATED_PATCH | Freq: Every day | TRANSDERMAL | Status: DC
  Administered 2015-04-22 – 2015-04-23 (×2): 21 mg via TRANSDERMAL
  Filled 2015-04-22 (×2): qty 1

## 2015-04-22 MED ORDER — KETOROLAC TROMETHAMINE 30 MG/ML IJ SOLN
30.0000 mg | Freq: Once | INTRAMUSCULAR | Status: DC
  Filled 2015-04-22: qty 1

## 2015-04-22 MED ORDER — PNEUMOCOCCAL VAC POLYVALENT 25 MCG/0.5ML IJ INJ
0.5000 mL | INJECTION | INTRAMUSCULAR | Status: AC
  Administered 2015-04-23: 0.5 mL via INTRAMUSCULAR
  Filled 2015-04-22 (×2): qty 0.5
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
## 2015-04-22 NOTE — Progress Notes (Signed)
This shift pt arrived to unit room 1504 via stretcher.. VS taken, gait unsteady and pt  Lethargic.. Family at bedside. callbell and pt guide within reach. Initial assessment completed. Will continue to monitor pt throughout shift

## 2015-04-22 NOTE — Progress Notes (Signed)
Patient declines the use of nocturnal CPAP and states she does not have one at home. RT will continue to follow.

## 2015-04-22 NOTE — Progress Notes (Signed)
Patient continues to decline the use of nocturnal CPAP.

## 2015-04-23 DIAGNOSIS — R400 Somnolence: Secondary | ICD-10-CM

## 2015-04-23 DIAGNOSIS — J4520 Mild intermittent asthma, uncomplicated: Secondary | ICD-10-CM | POA: Diagnosis not present

## 2015-04-23 DIAGNOSIS — I10 Essential (primary) hypertension: Secondary | ICD-10-CM | POA: Diagnosis not present

## 2015-04-23 LAB — PROTIME-INR
INR: 1.66 — AB (ref 0.00–1.49)
Prothrombin Time: 19.7 seconds — ABNORMAL HIGH (ref 11.6–15.2)

## 2015-04-23 MED ORDER — HYDROMORPHONE HCL 1 MG/ML IJ SOLN
1.0000 mg | INTRAMUSCULAR | Status: DC | PRN
  Administered 2015-04-23: 1 mg via INTRAVENOUS
  Filled 2015-04-23: qty 1

## 2015-04-23 MED ORDER — IPRATROPIUM-ALBUTEROL 0.5-2.5 (3) MG/3ML IN SOLN: 3.0000 mL | Freq: Four times a day (QID) | RESPIRATORY_TRACT | Status: DC

## 2015-04-23 MED ORDER — BENZONATATE 100 MG PO CAPS
200.0000 mg | ORAL_CAPSULE | Freq: Once | ORAL | Status: AC
  Administered 2015-04-23: 200 mg via ORAL
  Filled 2015-04-23: qty 2

## 2015-04-23 NOTE — Discharge Instructions (Signed)
Fatigue  Fatigue is feeling tired all of the time, a lack of energy, or a lack of motivation. Occasional or mild fatigue is often a normal response to activity or life in general. However, long-lasting (chronic) or extreme fatigue may indicate an underlying medical condition.  HOME CARE INSTRUCTIONS   Watch your fatigue for any changes. The following actions may help to lessen any discomfort you are feeling:  · Talk to your health care provider about how much sleep you need each night. Try to get the required amount every night.  · Take medicines only as directed by your health care provider.  · Eat a healthy and nutritious diet. Ask your health care provider if you need help changing your diet.  · Drink enough fluid to keep your urine clear or pale yellow.  · Practice ways of relaxing, such as yoga, meditation, massage therapy, or acupuncture.  · Exercise regularly.    · Change situations that cause you stress. Try to keep your work and personal routine reasonable.  · Do not abuse illegal drugs.  · Limit alcohol intake to no more than 1 drink per day for nonpregnant women and 2 drinks per day for men. One drink equals 12 ounces of beer, 5 ounces of wine, or 1½ ounces of hard liquor.  · Take a multivitamin, if directed by your health care provider.  SEEK MEDICAL CARE IF:   · Your fatigue does not get better.  · You have a fever.    · You have unintentional weight loss or gain.  · You have headaches.    · You have difficulty:      Falling asleep.    Sleeping throughout the night.  · You feel angry, guilty, anxious, or sad.     · You are unable to have a bowel movement (constipation).    · You skin is dry.     · Your legs or another part of your body is swollen.    SEEK IMMEDIATE MEDICAL CARE IF:   · You feel confused.    · Your vision is blurry.  · You feel faint or pass out.    · You have a severe headache.    · You have severe abdominal, pelvic, or back pain.    · You have chest pain, shortness of breath, or an  irregular or fast heartbeat.    · You are unable to urinate or you urinate less than normal.    · You develop abnormal bleeding, such as bleeding from the rectum, vagina, nose, lungs, or nipples.  · You vomit blood.     · You have thoughts about harming yourself or committing suicide.    · You are worried that you might harm someone else.       This information is not intended to replace advice given to you by your health care provider. Make sure you discuss any questions you have with your health care provider.     Document Released: 03/23/2007 Document Revised: 06/16/2014 Document Reviewed: 09/27/2013  Elsevier Interactive Patient Education ©2016 [employer]

## 2015-04-23 NOTE — Progress Notes (Signed)
Pt VSS. IV and tele d/c. Reviewed d/c summary, provided pt w/ prescriptions. No further questions.

## 2015-04-23 NOTE — Discharge Summary (Signed)
Physician Discharge Summary  Brooke [name] [id number] DOB: [birth year] DOA: 04/21/2015  PCP: Lorayne Marek, MD  Admit date: 04/21/2015 Discharge date: 04/23/2015  Recommendations for Outpatient Follow-up:  1. Pt will need to follow up with PCP in 2-3 weeks post discharge 2. Please obtain BMP to evaluate electrolytes and kidney function  Discharge Diagnoses:  Principal Problem:   Altered mental status Active Problems:   Morbid obesity (HCC)   Hypertension   Chronic pain syndrome   Asthma, chronic   Obstructive sleep apnea   DVT (deep venous thrombosis) ([place])  Discharge Condition: Stable  Diet recommendation: Heart healthy diet discussed in details    Brief narrative:    59 y.o. female with a past medical history significant for DVT on warfarin, chronic pain on oxycodone, morbid obesity, OSA, remote history of colon cancer, COPD, and HTN who presents with altered mental status, brought in by EMS this afternoon at 4 PM after being found slumped over the steering wheel of her car asleep with reportedly pinpoint pupils at the corner of [address] and [address]. At the scene, she was given Narcan and woke up, and stated that she had taken vodka, alprazolam, and oxycodone.   Assessment/Plan:    1. Altered mental status, somnolence:  - took combination of benzodiazepine and oxycodone with alcohol  - no evidence of an intentional overdose, no SI - mental status more clear, pt wants to go home, ambulating with no difficulty   2. HTN:  - continue home medical regimen   3. DVT on warfarin:  - coumadin per home medical regimen   4. Asthma:  - no wheezing, respiratory status is stable this AM   5. Headache:  - careful analgesia as needed  6. Morbid obesity - Body mass index is 49.32 kg/(m^2).  Code Status: Full.  Family Communication: plan of care discussed with the patient Disposition Plan: Home   IV access:  Peripheral  IV  Procedures and diagnostic studies:   Ct Head Wo Contrast 04/21/2015 No acute intracranial findings.   Medical Consultants:  None  Other Consultants:  None  IAnti-Infectives:   None       Discharge Exam: Filed Vitals:   04/23/15 0513  BP: 133/88  Pulse: 91  Temp: 97.6 F (36.4 C)  Resp: 16   Filed Vitals:   04/22/15 1456 04/22/15 2128 04/23/15 0513 04/23/15 0738  BP: 113/54 113/50 133/88   Pulse: 87 85 91   Temp: 97.3 F (36.3 C) 97.2 F (36.2 C) 97.6 F (36.4 C)   TempSrc: Oral Oral Oral   Resp: 18 18 16    Height:      Weight:      SpO2: 100% 100% 100% 97%    General: Pt is alert, follows commands appropriately, not in acute distress Cardiovascular: Regular rate and rhythm, no rubs, no gallops Respiratory: Clear to auscultation bilaterally, no wheezing, no crackles, no rhonchi Abdominal: Soft, non tender, non distended, bowel sounds +, no guarding  Discharge Instructions    Medication List    STOP taking these medications        prochlorperazine 10 MG tablet  Commonly known as:  COMPAZINE      TAKE these medications        acetaminophen 500 MG tablet  Commonly known as:  TYLENOL  Take 500 mg by mouth every 6 (six) hours as needed for headache.     albuterol 108 (90 BASE) MCG/ACT inhaler  Commonly known as:  PROVENTIL HFA;VENTOLIN HFA  Inhale 2 puffs into the lungs every 6 (six) hours as needed for wheezing or shortness of breath (shortness of breath).     amitriptyline 50 MG tablet  Commonly known as:  ELAVIL  Take 50 mg by mouth at bedtime.     citalopram 20 MG tablet  Commonly known as:  CELEXA  Take 20 mg by mouth daily.     cyclobenzaprine 10 MG tablet  Commonly known as:  FLEXERIL  Take 1 tablet (10 mg total) by mouth 2 (two) times daily as needed for muscle spasms.     diphenhydrAMINE 25 MG tablet  Commonly known as:  BENADRYL  Take 1 tablet (25 mg total) by mouth every 6 (six) hours as needed (at first sign of  headache).     furosemide 20 MG tablet  Commonly known as:  LASIX  Take 1 tablet (20 mg total) by mouth daily.     gabapentin 300 MG capsule  Commonly known as:  NEURONTIN  Take 1 capsule (300 mg total) by mouth at bedtime.     ipratropium-albuterol 0.5-2.5 (3) MG/3ML Soln  Commonly known as:  DUONEB  Take 3 mLs by nebulization every 6 (six) hours.     LORazepam 1 MG tablet  Commonly known as:  ATIVAN  Take 1 tablet (1 mg total) by mouth every 6 (six) hours as needed for anxiety (anxiety).     omeprazole 40 MG capsule  Commonly known as:  PRILOSEC  Take 1 capsule (40 mg total) by mouth daily.     oxyCODONE-acetaminophen 7.5-325 MG tablet  Commonly known as:  PERCOCET  Take 1 tablet by mouth every 6 (six) hours as needed for moderate pain. pain     potassium chloride SA 20 MEQ tablet  Commonly known as:  K-DUR,KLOR-CON  Take 1 tablet (20 mEq total) by mouth daily.     VITAMIN-B COMPLEX PO  Take 1 tablet by mouth daily.     warfarin 5 MG tablet  Commonly known as:  COUMADIN  Take 1 tablet (5 mg total) by mouth daily.          Medication List    STOP taking these medications        prochlorperazine 10 MG tablet  Commonly known as:  COMPAZINE      TAKE these medications        acetaminophen 500 MG tablet  Commonly known as:  TYLENOL  Take 500 mg by mouth every 6 (six) hours as needed for headache.     albuterol 108 (90 BASE) MCG/ACT inhaler  Commonly known as:  PROVENTIL HFA;VENTOLIN HFA  Inhale 2 puffs into the lungs every 6 (six) hours as needed for wheezing or shortness of breath (shortness of breath).     amitriptyline 50 MG tablet  Commonly known as:  ELAVIL  Take 50 mg by mouth at bedtime.     citalopram 20 MG tablet  Commonly known as:  CELEXA  Take 20 mg by mouth daily.     cyclobenzaprine 10 MG tablet  Commonly known as:  FLEXERIL  Take 1 tablet (10 mg total) by mouth 2 (two) times daily as needed for muscle spasms.     diphenhydrAMINE 25 MG  tablet  Commonly known as:  BENADRYL  Take 1 tablet (25 mg total) by mouth every 6 (six) hours as needed (at first sign of headache).     furosemide 20 MG tablet  Commonly known as:  LASIX  Take 1 tablet (20 mg total)  by mouth daily.     gabapentin 300 MG capsule  Commonly known as:  NEURONTIN  Take 1 capsule (300 mg total) by mouth at bedtime.     ipratropium-albuterol 0.5-2.5 (3) MG/3ML Soln  Commonly known as:  DUONEB  Take 3 mLs by nebulization every 6 (six) hours.     LORazepam 1 MG tablet  Commonly known as:  ATIVAN  Take 1 tablet (1 mg total) by mouth every 6 (six) hours as needed for anxiety (anxiety).     omeprazole 40 MG capsule  Commonly known as:  PRILOSEC  Take 1 capsule (40 mg total) by mouth daily.     oxyCODONE-acetaminophen 7.5-325 MG tablet  Commonly known as:  PERCOCET  Take 1 tablet by mouth every 6 (six) hours as needed for moderate pain. pain     potassium chloride SA 20 MEQ tablet  Commonly known as:  K-DUR,KLOR-CON  Take 1 tablet (20 mEq total) by mouth daily.     VITAMIN-B COMPLEX PO  Take 1 tablet by mouth daily.     warfarin 5 MG tablet  Commonly known as:  COUMADIN  Take 1 tablet (5 mg total) by mouth daily.           Follow-up Information    Call Faye Ramsay, MD.   Specialty:  Internal Medicine   Why:  As needed   Contact information:   [address] [facility] [place] [address] [phone]        The results of significant diagnostics from this hospitalization (including imaging, microbiology, ancillary and laboratory) are listed below for reference.     Microbiology: No results found for this or any previous visit (from the past 240 hour(s)).   Labs: Basic Metabolic Panel:  Recent Labs Lab 04/21/15 1815 04/21/15 1819  NA 139  --   K 3.7  --   CL 105  --   CO2 25  --   GLUCOSE 86  --   BUN 10  --   CREATININE 0.90  --   CALCIUM 8.5*  --   MG  --  1.9   Liver Function Tests:  Recent  Labs Lab 04/21/15 1815  AST 14*  ALT 8*  ALKPHOS 74  BILITOT 0.4  PROT 6.8  ALBUMIN 3.5   No results for input(s): LIPASE, AMYLASE in the last 168 hours. No results for input(s): AMMONIA in the last 168 hours. CBC:  Recent Labs Lab 04/21/15 1620  WBC 5.5  NEUTROABS 2.3  HGB 15.0  HCT 45.5  MCV 98.7  PLT 192   Cardiac Enzymes: No results for input(s): CKTOTAL, CKMB, CKMBINDEX, TROPONINI in the last 168 hours. BNP: BNP (last 3 results)  Recent Labs  09/23/14 1120  BNP 48.6    ProBNP (last 3 results) No results for input(s): PROBNP in the last 8760 hours.  CBG:  Recent Labs Lab 04/21/15 1638  GLUCAP 92     SIGNED: Time coordinating discharge:  30 minutes  MAGICK-Airon Sahni, MD  [facility] 04/23/2015, 9:14 AM Pager [phone]  If 7PM-7AM, please contact night-coverage [email or web address] Password [email or web address]

## 2015-05-09 IMAGING — CT CT HEAD W/O CM
2 series · 17 of 30 positions shown, 20 images · non-contrast
Comparison: Head CT 04/06/2009.

CLINICAL DATA: Headache.

EXAM:
CT HEAD WITHOUT CONTRAST
TECHNIQUE: Contiguous axial images were obtained from the base of the skull
through the vertex without intravenous contrast.

[Series 2: head w/o · axial · non-contrast · 0.42mm/px · z∈[+1352,+1477]mm · 9 of 33 slices shown, 12 images]
[im 4/33  brain]
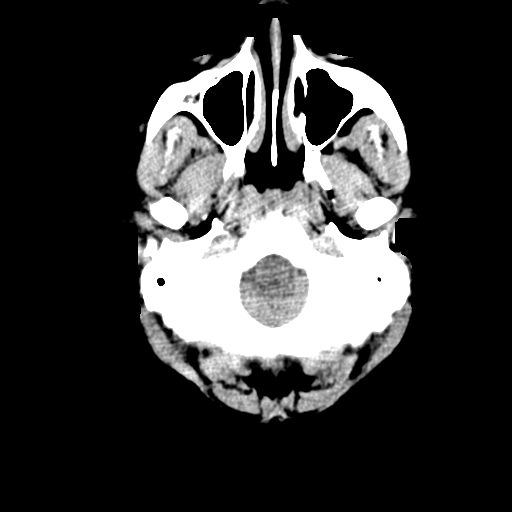
[im 4/33  bone]
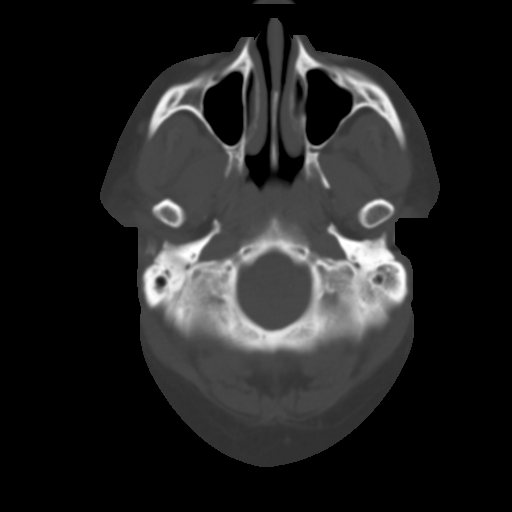
[im 7/33  brain]
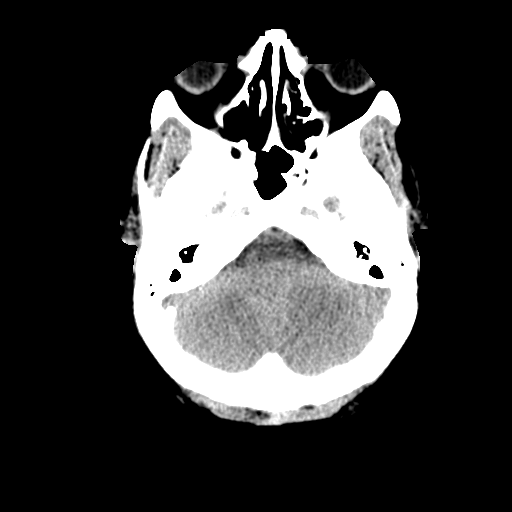
[im 10/33  brain]
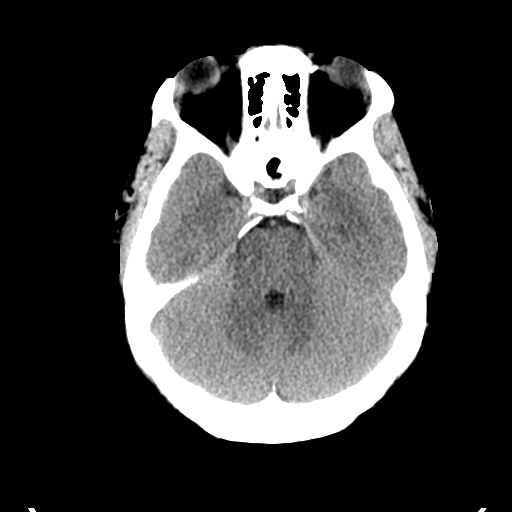
[im 13/33  brain]
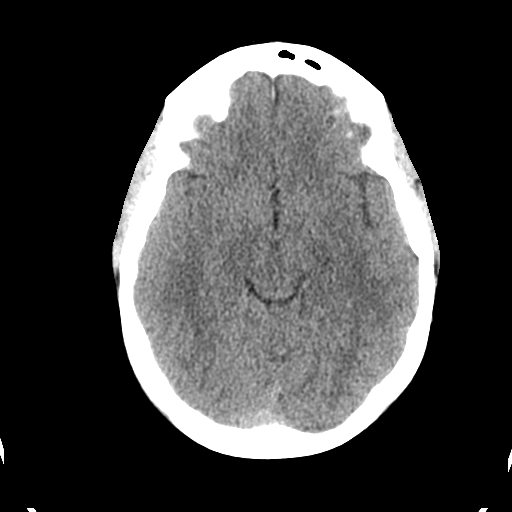
[im 17/33  brain]
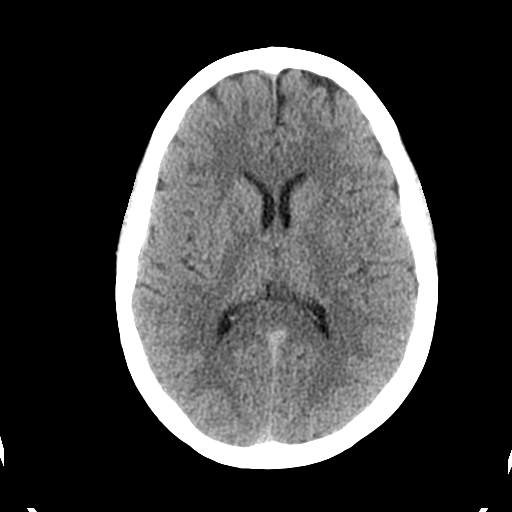
[im 17/33  bone]
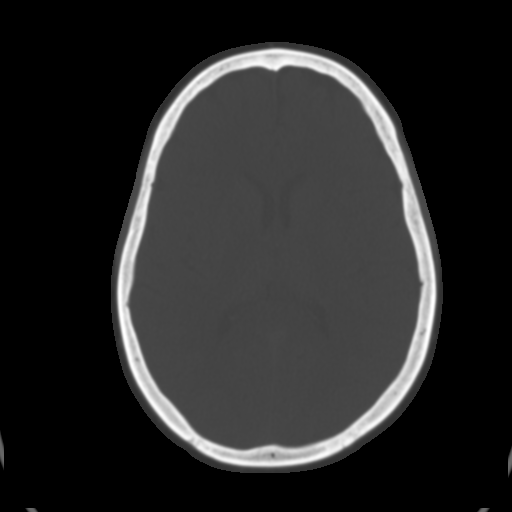
[im 20/33  brain]
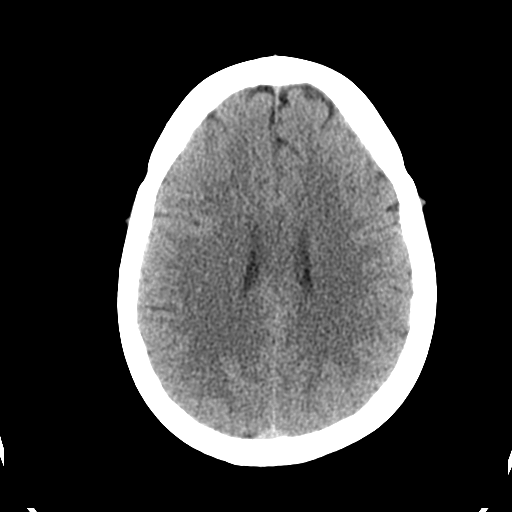
[im 23/33  brain]
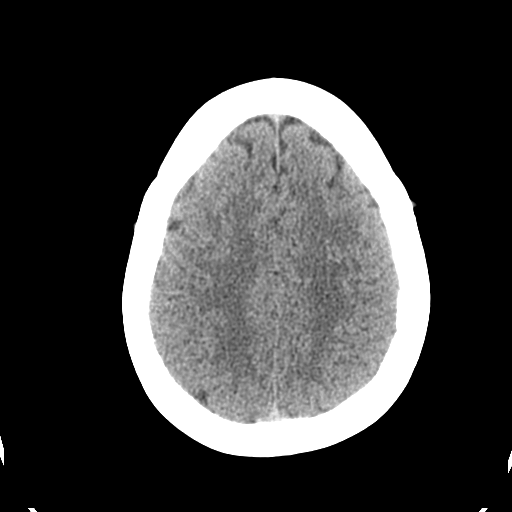
[im 26/33  brain]
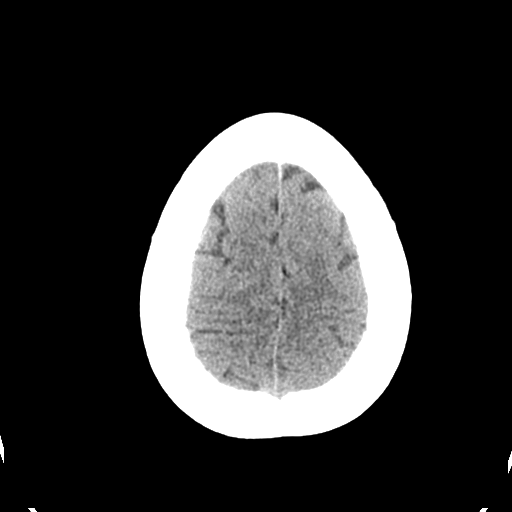
[im 29/33  brain]
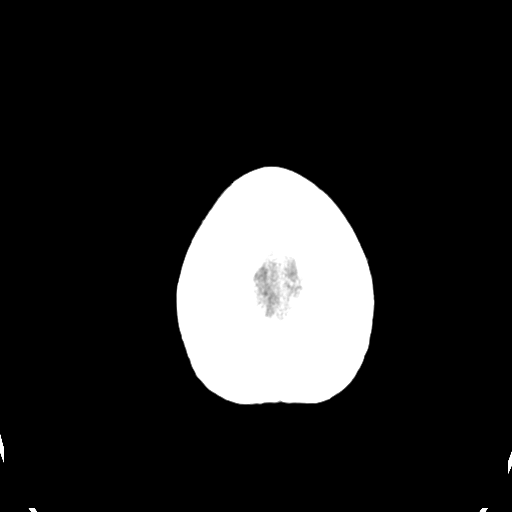
[im 29/33  bone]
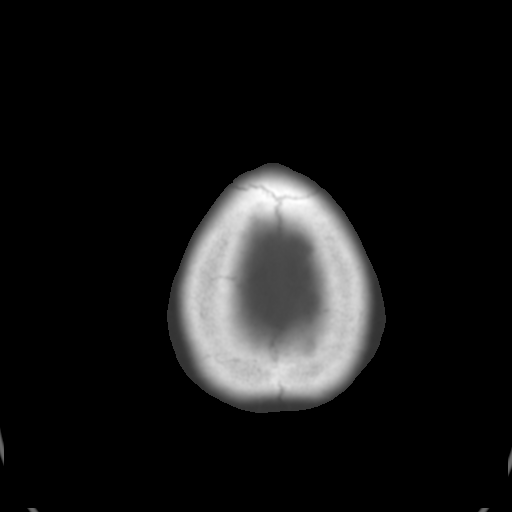

[Series 3: bone windows · axial · 0.42mm/px · z∈[+1352,+1478]mm · 8 of 54 slices shown]
[im 6/54  bone]
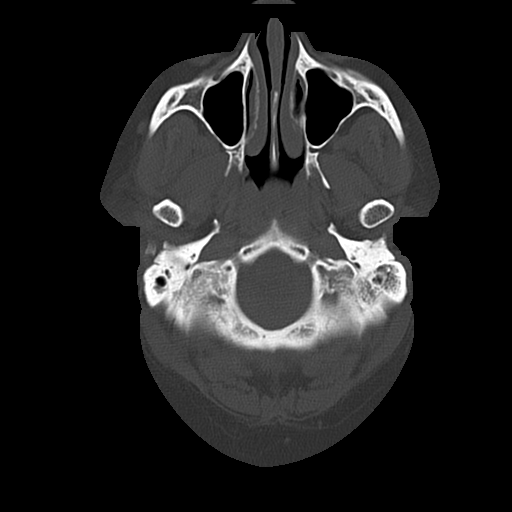
[im 12/54  bone]
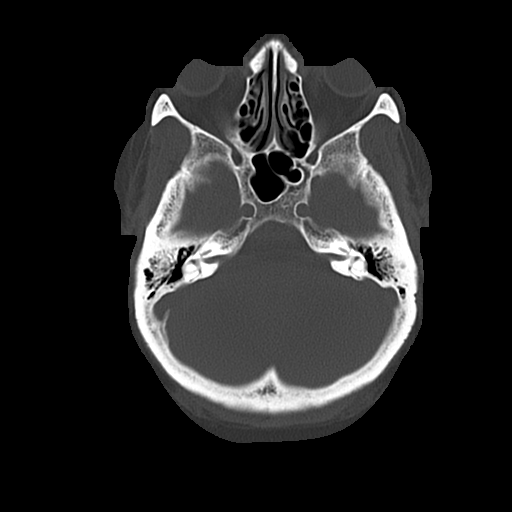
[im 18/54  bone]
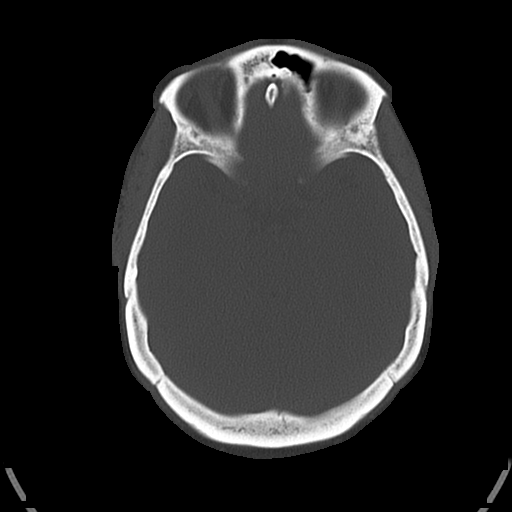
[im 24/54  bone]
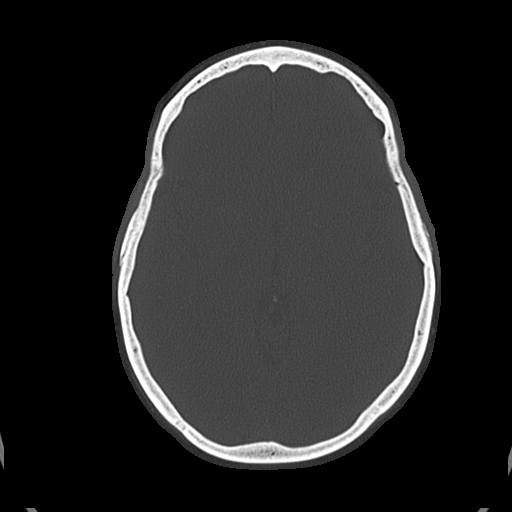
[im 30/54  bone]
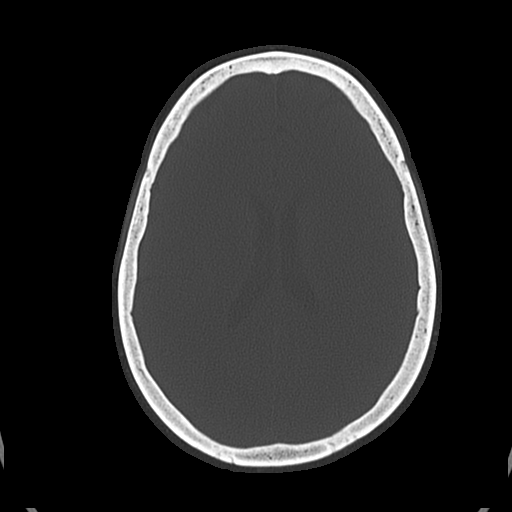
[im 36/54  bone]
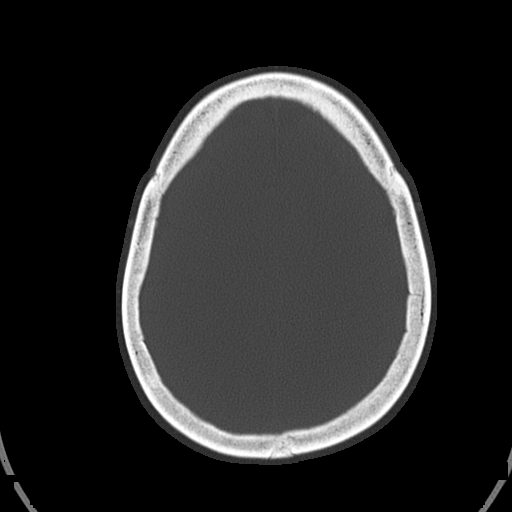
[im 42/54  bone]
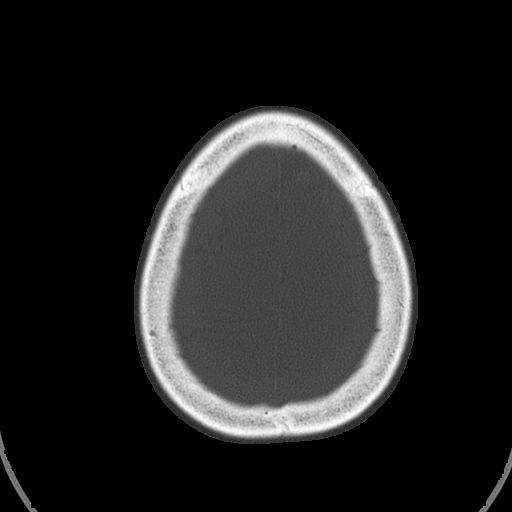
[im 48/54  bone]
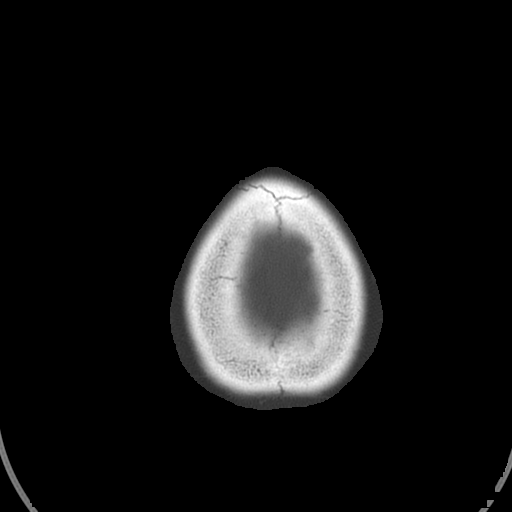

[17 of 30 positions shown; findings below may reference images not displayed]

FINDINGS: Physiologic calcifications of the basal ganglia bilaterally
(unchanged). No acute intracranial abnormalities. Specifically, no
evidence of acute intracranial hemorrhage, no definite findings of
acute/subacute cerebral ischemia, no mass, mass effect,
hydrocephalus or abnormal intra or extra-axial fluid collections.
Visualized paranasal sinuses and mastoids are well pneumatized. No
acute displaced skull fractures are identified.
IMPRESSION: 1. No acute intracranial abnormalities.

## 2015-05-26 ENCOUNTER — Emergency Department (HOSPITAL_COMMUNITY): Payer: Medicare HMO

## 2015-05-26 ENCOUNTER — Inpatient Hospital Stay (HOSPITAL_COMMUNITY)
Admission: EM | Admit: 2015-05-26 | Discharge: 2015-05-30 | DRG: 065 | Disposition: A | Discharge status: Rehabilitation Facility | Payer: Medicare HMO | Attending: Internal Medicine | Admitting: Internal Medicine

## 2015-05-26 ENCOUNTER — Encounter (HOSPITAL_COMMUNITY): Payer: Self-pay | Admitting: Radiology

## 2015-05-26 DIAGNOSIS — Z86711 Personal history of pulmonary embolism: Secondary | ICD-10-CM

## 2015-05-26 DIAGNOSIS — R4701 Aphasia: Secondary | ICD-10-CM | POA: Diagnosis present

## 2015-05-26 DIAGNOSIS — E876 Hypokalemia: Secondary | ICD-10-CM | POA: Diagnosis not present

## 2015-05-26 DIAGNOSIS — M549 Dorsalgia, unspecified: Secondary | ICD-10-CM | POA: Diagnosis present

## 2015-05-26 DIAGNOSIS — E875 Hyperkalemia: Secondary | ICD-10-CM | POA: Diagnosis present

## 2015-05-26 DIAGNOSIS — E662 Morbid (severe) obesity with alveolar hypoventilation: Secondary | ICD-10-CM | POA: Diagnosis present

## 2015-05-26 DIAGNOSIS — I639 Cerebral infarction, unspecified: Secondary | ICD-10-CM

## 2015-05-26 DIAGNOSIS — I63512 Cerebral infarction due to unspecified occlusion or stenosis of left middle cerebral artery: Secondary | ICD-10-CM | POA: Diagnosis not present

## 2015-05-26 DIAGNOSIS — I69391 Dysphagia following cerebral infarction: Secondary | ICD-10-CM

## 2015-05-26 DIAGNOSIS — I63412 Cerebral infarction due to embolism of left middle cerebral artery: Secondary | ICD-10-CM | POA: Diagnosis present

## 2015-05-26 DIAGNOSIS — R7989 Other specified abnormal findings of blood chemistry: Secondary | ICD-10-CM

## 2015-05-26 DIAGNOSIS — R531 Weakness: Secondary | ICD-10-CM | POA: Diagnosis not present

## 2015-05-26 DIAGNOSIS — J449 Chronic obstructive pulmonary disease, unspecified: Secondary | ICD-10-CM | POA: Diagnosis present

## 2015-05-26 DIAGNOSIS — R29810 Facial weakness: Secondary | ICD-10-CM | POA: Diagnosis present

## 2015-05-26 DIAGNOSIS — G894 Chronic pain syndrome: Secondary | ICD-10-CM | POA: Diagnosis not present

## 2015-05-26 DIAGNOSIS — G8929 Other chronic pain: Secondary | ICD-10-CM | POA: Diagnosis present

## 2015-05-26 DIAGNOSIS — I69359 Hemiplegia and hemiparesis following cerebral infarction affecting unspecified side: Secondary | ICD-10-CM

## 2015-05-26 DIAGNOSIS — G4733 Obstructive sleep apnea (adult) (pediatric): Secondary | ICD-10-CM | POA: Diagnosis not present

## 2015-05-26 DIAGNOSIS — R1311 Dysphagia, oral phase: Secondary | ICD-10-CM | POA: Diagnosis present

## 2015-05-26 DIAGNOSIS — Z86718 Personal history of other venous thrombosis and embolism: Secondary | ICD-10-CM | POA: Diagnosis not present

## 2015-05-26 DIAGNOSIS — G43909 Migraine, unspecified, not intractable, without status migrainosus: Secondary | ICD-10-CM | POA: Diagnosis present

## 2015-05-26 DIAGNOSIS — E785 Hyperlipidemia, unspecified: Secondary | ICD-10-CM | POA: Diagnosis present

## 2015-05-26 DIAGNOSIS — I6789 Other cerebrovascular disease: Secondary | ICD-10-CM | POA: Diagnosis not present

## 2015-05-26 DIAGNOSIS — F419 Anxiety disorder, unspecified: Secondary | ICD-10-CM | POA: Diagnosis present

## 2015-05-26 DIAGNOSIS — I69320 Aphasia following cerebral infarction: Secondary | ICD-10-CM | POA: Diagnosis not present

## 2015-05-26 DIAGNOSIS — R079 Chest pain, unspecified: Secondary | ICD-10-CM | POA: Diagnosis present

## 2015-05-26 DIAGNOSIS — I10 Essential (primary) hypertension: Secondary | ICD-10-CM | POA: Diagnosis present

## 2015-05-26 DIAGNOSIS — J45909 Unspecified asthma, uncomplicated: Secondary | ICD-10-CM | POA: Diagnosis present

## 2015-05-26 DIAGNOSIS — R778 Other specified abnormalities of plasma proteins: Secondary | ICD-10-CM

## 2015-05-26 DIAGNOSIS — Z7901 Long term (current) use of anticoagulants: Secondary | ICD-10-CM | POA: Diagnosis not present

## 2015-05-26 DIAGNOSIS — I69351 Hemiplegia and hemiparesis following cerebral infarction affecting right dominant side: Secondary | ICD-10-CM | POA: Diagnosis not present

## 2015-05-26 DIAGNOSIS — G8191 Hemiplegia, unspecified affecting right dominant side: Secondary | ICD-10-CM | POA: Diagnosis present

## 2015-05-26 DIAGNOSIS — K5901 Slow transit constipation: Secondary | ICD-10-CM | POA: Diagnosis not present

## 2015-05-26 DIAGNOSIS — K219 Gastro-esophageal reflux disease without esophagitis: Secondary | ICD-10-CM | POA: Diagnosis present

## 2015-05-26 DIAGNOSIS — Z6841 Body Mass Index (BMI) 40.0 and over, adult: Secondary | ICD-10-CM

## 2015-05-26 DIAGNOSIS — Z85038 Personal history of other malignant neoplasm of large intestine: Secondary | ICD-10-CM

## 2015-05-26 DIAGNOSIS — Z9049 Acquired absence of other specified parts of digestive tract: Secondary | ICD-10-CM

## 2015-05-26 DIAGNOSIS — F17210 Nicotine dependence, cigarettes, uncomplicated: Secondary | ICD-10-CM | POA: Diagnosis present

## 2015-05-26 DIAGNOSIS — Z8669 Personal history of other diseases of the nervous system and sense organs: Secondary | ICD-10-CM | POA: Insufficient documentation

## 2015-05-26 DIAGNOSIS — R4702 Dysphasia: Secondary | ICD-10-CM | POA: Diagnosis not present

## 2015-05-26 DIAGNOSIS — F329 Major depressive disorder, single episode, unspecified: Secondary | ICD-10-CM | POA: Diagnosis present

## 2015-05-26 DIAGNOSIS — R4182 Altered mental status, unspecified: Secondary | ICD-10-CM | POA: Diagnosis present

## 2015-05-26 HISTORY — DX: Ventricular premature depolarization: I49.3

## 2015-05-26 HISTORY — DX: Tobacco use: Z72.0

## 2015-05-26 HISTORY — DX: Nonrheumatic aortic (valve) stenosis: I35.0

## 2015-05-26 HISTORY — DX: Other pulmonary embolism without acute cor pulmonale: I26.99

## 2015-05-26 LAB — COMPREHENSIVE METABOLIC PANEL WITH GFR
ALT: 13 U/L — ABNORMAL LOW (ref 14–54)
AST: 24 U/L (ref 15–41)
Albumin: 3.7 g/dL (ref 3.5–5.0)
Alkaline Phosphatase: 85 U/L (ref 38–126)
Anion gap: 10 (ref 5–15)
BUN: 9 mg/dL (ref 6–20)
CO2: 23 mmol/L (ref 22–32)
Calcium: 9 mg/dL (ref 8.9–10.3)
Chloride: 105 mmol/L (ref 101–111)
Creatinine, Ser: 1.06 mg/dL — ABNORMAL HIGH (ref 0.44–1.00)
GFR calc Af Amer: >60 mL/min
GFR calc non Af Amer: 57 mL/min — ABNORMAL LOW
Glucose, Bld: 95 mg/dL (ref 65–99)
Potassium: 5.2 mmol/L — ABNORMAL HIGH (ref 3.5–5.1)
Sodium: 138 mmol/L (ref 135–145)
Total Bilirubin: 1 mg/dL (ref 0.3–1.2)
Total Protein: 7.5 g/dL (ref 6.5–8.1)

## 2015-05-26 LAB — URINALYSIS, ROUTINE W REFLEX MICROSCOPIC
Bilirubin Urine: NEGATIVE
Glucose, UA: NEGATIVE mg/dL
Hgb urine dipstick: NEGATIVE
Ketones, ur: NEGATIVE mg/dL
Leukocytes, UA: NEGATIVE
Nitrite: NEGATIVE
Protein, ur: NEGATIVE mg/dL
Specific Gravity, Urine: >1.046 — ABNORMAL HIGH (ref 1.005–1.030)
pH: 5.5 (ref 5.0–8.0)

## 2015-05-26 LAB — DIFFERENTIAL
BASOS ABS: 0 10*3/uL (ref 0.0–0.1)
BASOS PCT: 1 %
Eosinophils Absolute: 0.1 10*3/uL (ref 0.0–0.7)
Eosinophils Relative: 2 %
LYMPHS PCT: 35 %
Lymphs Abs: 2.3 10*3/uL (ref 0.7–4.0)
Monocytes Absolute: 0.6 10*3/uL (ref 0.1–1.0)
Monocytes Relative: 9 %
NEUTROS ABS: 3.4 10*3/uL (ref 1.7–7.7)
NEUTROS PCT: 53 %

## 2015-05-26 LAB — RAPID URINE DRUG SCREEN, HOSP PERFORMED
AMPHETAMINES: NOT DETECTED
BENZODIAZEPINES: NOT DETECTED
Barbiturates: NOT DETECTED
Cocaine: NOT DETECTED
Opiates: NOT DETECTED
TETRAHYDROCANNABINOL: NOT DETECTED

## 2015-05-26 LAB — CBC
HCT: 47.7 % — ABNORMAL HIGH (ref 36.0–46.0)
Hemoglobin: 15.2 g/dL — ABNORMAL HIGH (ref 12.0–15.0)
MCH: 32.1 pg (ref 26.0–34.0)
MCHC: 31.9 g/dL (ref 30.0–36.0)
MCV: 100.6 fL — AB (ref 78.0–100.0)
PLATELETS: 215 10*3/uL (ref 150–400)
RBC: 4.74 MIL/uL (ref 3.87–5.11)
RDW: 14.2 % (ref 11.5–15.5)
WBC: 6.5 10*3/uL (ref 4.0–10.5)

## 2015-05-26 LAB — APTT: APTT: 30 s (ref 24–37)

## 2015-05-26 LAB — I-STAT CHEM 8, ED
BUN: 14 mg/dL (ref 6–20)
Calcium, Ion: 1.07 mmol/L — ABNORMAL LOW (ref 1.12–1.23)
Chloride: 107 mmol/L (ref 101–111)
Creatinine, Ser: 1 mg/dL (ref 0.44–1.00)
Glucose, Bld: 94 mg/dL (ref 65–99)
HCT: 52 % — ABNORMAL HIGH (ref 36.0–46.0)
Hemoglobin: 17.7 g/dL — ABNORMAL HIGH (ref 12.0–15.0)
Potassium: 5.6 mmol/L — ABNORMAL HIGH (ref 3.5–5.1)
Sodium: 140 mmol/L (ref 135–145)
TCO2: 28 mmol/L (ref 0–100)

## 2015-05-26 LAB — PROTIME-INR
INR: 1.61 — AB (ref 0.00–1.49)
PROTHROMBIN TIME: 19.1 s — AB (ref 11.6–15.2)

## 2015-05-26 LAB — I-STAT TROPONIN, ED: Troponin i, poc: 0 ng/mL (ref 0.00–0.08)

## 2015-05-26 LAB — CBG MONITORING, ED: Glucose-Capillary: 85 mg/dL (ref 65–99)

## 2015-05-26 LAB — ETHANOL: Alcohol, Ethyl (B): <5 mg/dL (ref <5)

## 2015-05-26 MED ORDER — SODIUM CHLORIDE 0.9 % IJ SOLN
3.0000 mL | Freq: Two times a day (BID) | INTRAMUSCULAR | Status: DC
  Administered 2015-05-28 – 2015-05-29 (×3): 3 mL via INTRAVENOUS

## 2015-05-26 MED ORDER — SODIUM CHLORIDE 0.9 % IJ SOLN
10.0000 mL | INTRAMUSCULAR | Status: DC | PRN
  Administered 2015-05-26 – 2015-05-28 (×4): 10 mL
  Filled 2015-05-26 (×4): qty 40

## 2015-05-26 MED ORDER — ONDANSETRON HCL 4 MG PO TABS: 4.0000 mg | ORAL_TABLET | Freq: Four times a day (QID) | ORAL | Status: DC | PRN

## 2015-05-26 MED ORDER — IOHEXOL 350 MG/ML SOLN
90.0000 mL | Freq: Once | INTRAVENOUS | Status: AC | PRN
  Administered 2015-05-26: 90 mL via INTRAVENOUS

## 2015-05-26 MED ORDER — ONDANSETRON HCL 4 MG/2ML IJ SOLN
4.0000 mg | Freq: Four times a day (QID) | INTRAMUSCULAR | Status: DC | PRN
  Administered 2015-05-28: 4 mg via INTRAVENOUS
  Filled 2015-05-26: qty 2

## 2015-05-26 MED ORDER — IPRATROPIUM-ALBUTEROL 0.5-2.5 (3) MG/3ML IN SOLN
3.0000 mL | Freq: Four times a day (QID) | RESPIRATORY_TRACT | Status: DC | PRN
  Administered 2015-05-29: 3 mL via RESPIRATORY_TRACT
  Filled 2015-05-26: qty 3

## 2015-05-26 MED ORDER — SODIUM CHLORIDE 0.9 % IJ SOLN
10.0000 mL | Freq: Two times a day (BID) | INTRAMUSCULAR | Status: DC
  Administered 2015-05-26: 10 mL
  Administered 2015-05-28: 20 mL

## 2015-05-26 MED ORDER — LORAZEPAM 2 MG/ML IJ SOLN
1.0000 mg | Freq: Four times a day (QID) | INTRAMUSCULAR | Status: DC | PRN
  Administered 2015-05-26 – 2015-05-29 (×5): 1 mg via INTRAVENOUS
  Filled 2015-05-26 (×5): qty 1

## 2015-05-26 MED ORDER — CETYLPYRIDINIUM CHLORIDE 0.05 % MT LIQD
7.0000 mL | Freq: Two times a day (BID) | OROMUCOSAL | Status: DC
  Administered 2015-05-26 – 2015-05-27 (×2): 7 mL via OROMUCOSAL

## 2015-05-26 MED ORDER — MORPHINE SULFATE (PF) 2 MG/ML IV SOLN
1.0000 mg | INTRAVENOUS | Status: DC | PRN
  Administered 2015-05-27: 1 mg via INTRAVENOUS
  Filled 2015-05-26: qty 1

## 2015-05-26 MED ORDER — HEPARIN (PORCINE) IN NACL 100-0.45 UNIT/ML-% IJ SOLN
1350.0000 [IU]/h | INTRAMUSCULAR | Status: DC
  Administered 2015-05-26: 1000 [IU]/h via INTRAVENOUS
  Filled 2015-05-26 (×4): qty 250

## 2015-05-26 MED ORDER — MORPHINE SULFATE (PF) 2 MG/ML IV SOLN
1.0000 mg | Freq: Four times a day (QID) | INTRAVENOUS | Status: DC | PRN
  Administered 2015-05-26: 1 mg via INTRAVENOUS
  Filled 2015-05-26: qty 1

## 2015-05-26 MED ORDER — SODIUM CHLORIDE 0.9 % IV SOLN
INTRAVENOUS | Status: DC
  Administered 2015-05-26: 17:00:00 via INTRAVENOUS
  Administered 2015-05-28: 75 mL/h via INTRAVENOUS

## 2015-05-26 MED ORDER — ASPIRIN 300 MG RE SUPP
300.0000 mg | Freq: Every day | RECTAL | Status: DC
  Administered 2015-05-26 – 2015-05-28 (×3): 300 mg via RECTAL
  Filled 2015-05-26 (×3): qty 1

## 2015-05-26 NOTE — ED Notes (Signed)
Xray called

## 2015-05-26 NOTE — ED Notes (Signed)
PICC Line team at bedside placing piccl

## 2015-05-26 NOTE — ED Provider Notes (Addendum)
CSN: [id number]     Arrival date & time 05/26/15  [id number] History   None    No chief complaint on file.   Level V caveat: Altered mental status.   HPI Per EMS the patient was found altered by her family this morning sitting in a chair.  She was unable to speak clearly and thus EMS was contacted.  She has a history of hypertension and hyperlipidemia as well as a history of headaches.  EMS reports worsening facial droop and reports some right-sided weakness as well.  Per the medical record there is no prior history of stroke.  Per the medical record she is currently on Coumadin for history of DVT.     Past Medical History  Diagnosis Date  . Hypertension   . Asthma   . COPD (chronic obstructive pulmonary disease) ([place])   . Anxiety   . Cancer ([place])   . Colon cancer ([place])   . Dyslipidemia   . Migraine headache   . Chronic bronchitis   . Uterine fibroid   . Ovarian cyst   . GERD (gastroesophageal reflux disease)   . Morbid obesity ([place])   . Obstructive sleep apnea     mild  . Chronic pain   . Chronic back pain   . Arthritis   . Osteoarthritis   . Depression   . DVT (deep vein thrombosis) in pregnancy   . DVT (deep venous thrombosis) [facility])    Past Surgical History  Procedure Laterality Date  . Knee arthroscopy      bil.  . Carpal tunnel release      rt  . Mouth surgery      teeth extraction  . Cesarean section    . Subtotal colectomy    . Colonoscopy    . Tubal ligation     Family History  Problem Relation Age of Onset  . Uterine cancer Mother   . Stroke Father   . Colon cancer Maternal Uncle   . Diabetes Father   . Ovarian cancer Mother   . Hypertension Father   . Breast cancer Maternal Grandmother   . Diabetes Sister   . Asthma Child   . Asthma Child    Social History  Substance Use Topics  . Smoking status: Current Some Day Smoker -- 1.00 packs/day for 37 years    Types: Cigarettes  . Smokeless tobacco: Never Used     Comment: trying to quit, down to  .5ppd X2 years ago.   . Alcohol Use: No   OB History    Gravida Para Term Preterm AB TAB SAB Ectopic Multiple Living   2 2 2       2      Review of Systems  Unable to perform ROS: Mental status change      Allergies  Kiwi extract and Aspirin  Home Medications   Prior to Admission medications   Medication Sig Start Date End Date Taking? Authorizing Provider  acetaminophen (TYLENOL) 500 MG tablet Take 500 mg by mouth every 6 (six) hours as needed for headache.    Historical Provider, MD  albuterol (PROVENTIL HFA;VENTOLIN HFA) 108 (90 BASE) MCG/ACT inhaler Inhale 2 puffs into the lungs every 6 (six) hours as needed for wheezing or shortness of breath (shortness of breath). 02/20/14   Oswald Hillock, MD  amitriptyline (ELAVIL) 50 MG tablet Take 50 mg by mouth at bedtime. 03/27/15   Historical Provider, MD  B Complex Vitamins (VITAMIN-B COMPLEX PO) Take 1 tablet by  mouth daily.    Historical Provider, MD  citalopram (CELEXA) 20 MG tablet Take 20 mg by mouth daily. 03/01/15   Historical Provider, MD  cyclobenzaprine (FLEXERIL) 10 MG tablet Take 1 tablet (10 mg total) by mouth 2 (two) times daily as needed for muscle spasms. 10/26/14   Evelina Bucy, MD  diphenhydrAMINE (BENADRYL) 25 MG tablet Take 1 tablet (25 mg total) by mouth every 6 (six) hours as needed (at first sign of headache). Patient taking differently: Take 25 mg by mouth every 6 (six) hours as needed for itching or allergies.  02/15/15   Sharlett Iles, MD  furosemide (LASIX) 20 MG tablet Take 1 tablet (20 mg total) by mouth daily. 08/25/14   Lorayne Marek, MD  gabapentin (NEURONTIN) 300 MG capsule Take 1 capsule (300 mg total) by mouth at bedtime. 10/18/14   Melony Overly, MD  ipratropium-albuterol (DUONEB) 0.5-2.5 (3) MG/3ML SOLN Take 3 mLs by nebulization every 6 (six) hours. 04/23/15   Theodis Blaze, MD  LORazepam (ATIVAN) 1 MG tablet Take 1 tablet (1 mg total) by mouth every 6 (six) hours as needed for anxiety (anxiety). 08/25/14    Lorayne Marek, MD  omeprazole (PRILOSEC) 40 MG capsule Take 1 capsule (40 mg total) by mouth daily. 08/25/14   Lorayne Marek, MD  oxyCODONE-acetaminophen (PERCOCET) 7.5-325 MG tablet Take 1 tablet by mouth every 6 (six) hours as needed for moderate pain. pain 03/28/15   Historical Provider, MD  potassium chloride SA (K-DUR,KLOR-CON) 20 MEQ tablet Take 1 tablet (20 mEq total) by mouth daily. 08/25/14   Lorayne Marek, MD  warfarin (COUMADIN) 5 MG tablet Take 1 tablet (5 mg total) by mouth daily. 08/25/14   Lorayne Marek, MD   LMP 05/24/2003 Physical Exam  Constitutional: She appears well-developed and well-nourished. No distress.  HENT:  Head: Normocephalic and atraumatic.  Eyes: EOM are normal. Pupils are equal, round, and reactive to light.  Neck: Normal range of motion.  Cardiovascular: Normal rate, regular rhythm and normal heart sounds.   Pulmonary/Chest: Effort normal and breath sounds normal.  Abdominal: Soft. She exhibits no distension. There is no tenderness.  Musculoskeletal: Normal range of motion.  Neurological: She is alert.  Expressive and receptive aphasia with mild dysarthria.  Mild right arm weakness.  Able to ambulate in the room.  No appreciable weakness of the lower extremities.  Occult exam given what appears to be receptive aphasia  Skin: Skin is warm and dry.  Psychiatric: She has a normal mood and affect. Judgment normal.  Nursing note and vitals reviewed.   ED Course  Procedures (including critical care time)  CRITICAL CARE Performed by: Hoy Morn Total critical care time: 45 minutes Critical care time was exclusive of separately billable procedures and treating other patients. Critical care was necessary to treat or prevent imminent or life-threatening deterioration. Critical care was time spent personally by me on the following activities: development of treatment plan with patient and/or surrogate as well as nursing, discussions with consultants,  evaluation of patient's response to treatment, examination of patient, obtaining history from patient or surrogate, ordering and performing treatments and interventions, ordering and review of laboratory studies, ordering and review of radiographic studies, pulse oximetry and re-evaluation of patient's condition.   Angiocath insertion Performed by: Hoy Morn Consent: Verbal consent obtained. Risks and benefits: risks, benefits and alternatives were discussed Time out: Immediately prior to procedure a "time out" was called to verify the correct patient, procedure, equipment, support staff and site/side marked as  required. Preparation: Patient was prepped and draped in the usual sterile fashion. Vein Location: left cephalic vein Ultrasound Guided Gauge: 18 Normal blood return and flush without difficulty Patient tolerance: Patient tolerated the procedure well with no immediate complications.     Labs Review Labs Reviewed  CBC - Abnormal; Notable for the following:    Hemoglobin 15.2 (*)    HCT 47.7 (*)    MCV 100.6 (*)    All other components within normal limits  I-STAT CHEM 8, ED - Abnormal; Notable for the following:    Potassium 5.6 (*)    Calcium, Ion 1.07 (*)    Hemoglobin 17.7 (*)    HCT 52.0 (*)    All other components within normal limits  DIFFERENTIAL  ETHANOL  PROTIME-INR  APTT  COMPREHENSIVE METABOLIC PANEL  URINE RAPID DRUG SCREEN, HOSP PERFORMED  URINALYSIS, ROUTINE W REFLEX MICROSCOPIC (NOT AT [facility])  I-STAT TROPOININ, ED  CBG MONITORING, ED  I-STAT CHEM 8, ED    Imaging Review Ct Angio Head W/cm &/or Wo Cm  05/26/2015  CLINICAL DATA:  Neck up stroke. Altered mental status. Unable to speak clearly. Right-sided weakness and facial droop. EXAM: CT ANGIOGRAPHY HEAD AND NECK TECHNIQUE: Multidetector CT imaging of the head and neck was performed using the standard protocol during bolus administration of intravenous contrast. Multiplanar CT image  reconstructions and MIPs were obtained to evaluate the vascular anatomy. Carotid stenosis measurements (when applicable) are obtained utilizing NASCET criteria, using the distal internal carotid diameter as the denominator. CONTRAST:  56mL OMNIPAQUE IOHEXOL 350 MG/ML SOLN COMPARISON:  CT profusion study from the same day. CT head without contrast from the same day. FINDINGS: CT HEAD Brain: Source images confirm an acute infarct involving the posterior left insular ribbon is well is the cortex over the superior left temporal lobe and left parietal lobe. No acute hemorrhage or mass lesion is present. The ganglia are intact. Calvarium and skull base: Negative. Paranasal sinuses: Clear Orbits: Within normal limits. CTA NECK Aortic arch: A 3 vessel arch configuration is present. Atherosclerotic calcifications are present without significant stenoses at the origins the great vessels. Right carotid system: The right common carotid artery is within normal limits. Minimal atherosclerotic changes are present at the carotid bifurcation. The cervical right ICA is normal. Left carotid system: The left common carotid artery is within normal limits. Atherosclerotic changes are slightly more prominent within the left carotid bifurcation. There is no significant stenosis relative to the more distal vessel. The cervical left ICA is normal. Vertebral arteries:The vertebral arteries both originate from the subclavian arteries without significant stenosis. The left vertebral artery is dominant. There is no focal stenosis or vascular injury within the cervical vertebral arteries. Skeleton: Mild endplate degenerative changes are most noted at C3-4 and C4-5. No focal lytic or blastic lesions are present. Other neck: The soft tissues of the neck are unremarkable. CTA HEAD Anterior circulation: The internal carotid arteries are within normal limits from the high cervical segments through the ICA termini. The left A1 segment is dominant. The  M1 segments are intact bilaterally. There is a significant proximal stenosis in the more lateral left M2 segment with significant attenuation of more distal branch vessels no focal proximal occlusion is present. Right MCA and bilateral ACA branch vessels are within normal limits. Posterior circulation: The left vertebral artery is slightly dominant to the right. The PICA origins are visualized and normal bilaterally. Basilar artery is within normal limits. Both posterior cerebral arteries originate the basilar tip.  PCA branch vessels are intact. Venous sinuses: The dural sinuses are patent. The right transverse sinus is dominant. The straight sinus and deep cerebral veins are patent. Cortical veins are intact. Anatomic variants: None Delayed phase: Postcontrast images better demonstrate the areas of developing acute nonhemorrhagic infarction involving the posterior left insular cortex, left temporal lobe, and left parietal lobe. No pathologic enhancement is evident. IMPRESSION: 1. Developing acute/subacute nonhemorrhagic infarct of posterior left insular cortex, superior left temporal, and left parietal lobe. 2. Moderate to severe focal stenosis within the more lateral left M2 branch with significant attenuation of distal MCA branch vessels corresponding to the infarct territory. 3. No other significant proximal stenosis, aneurysm, or branch vessel occlusion. These results were called by telephone at the time of interpretation on 05/26/2015 at 12:51 Pm to Dr. Aram Beecham, who verbally acknowledged these results. Electronically Signed   By: San Morelle M.D.   On: 05/26/2015 13:10   Ct Head Wo Contrast  05/26/2015  CLINICAL DATA:  Unresponsive EXAM: CT HEAD WITHOUT CONTRAST TECHNIQUE: Contiguous axial images were obtained from the base of the skull through the vertex without intravenous contrast. COMPARISON:  04/21/2015 FINDINGS: Bony calvarium is intact. No acute hemorrhage or space-occupying mass lesion is  noted. There is some blunting of the sulcal markings in the left parietal lobe near the vertex which may represent some very early ischemia. No other focal abnormality is noted. IMPRESSION: Question early ischemia in distribution of the left middle cerebral artery. No other focal abnormality is noted. These results were called by telephone at the time of interpretation on 05/26/2015 at 9:11 am to Dr. Jola Schmidt , who verbally acknowledged these results. Electronically Signed   By: Inez Catalina M.D.   On: 05/26/2015 09:11   Ct Angio Neck W/cm &/or Wo/cm  05/26/2015  CLINICAL DATA:  Neck up stroke. Altered mental status. Unable to speak clearly. Right-sided weakness and facial droop. EXAM: CT ANGIOGRAPHY HEAD AND NECK TECHNIQUE: Multidetector CT imaging of the head and neck was performed using the standard protocol during bolus administration of intravenous contrast. Multiplanar CT image reconstructions and MIPs were obtained to evaluate the vascular anatomy. Carotid stenosis measurements (when applicable) are obtained utilizing NASCET criteria, using the distal internal carotid diameter as the denominator. CONTRAST:  50mL OMNIPAQUE IOHEXOL 350 MG/ML SOLN COMPARISON:  CT profusion study from the same day. CT head without contrast from the same day. FINDINGS: CT HEAD Brain: Source images confirm an acute infarct involving the posterior left insular ribbon is well is the cortex over the superior left temporal lobe and left parietal lobe. No acute hemorrhage or mass lesion is present. The ganglia are intact. Calvarium and skull base: Negative. Paranasal sinuses: Clear Orbits: Within normal limits. CTA NECK Aortic arch: A 3 vessel arch configuration is present. Atherosclerotic calcifications are present without significant stenoses at the origins the great vessels. Right carotid system: The right common carotid artery is within normal limits. Minimal atherosclerotic changes are present at the carotid bifurcation.  The cervical right ICA is normal. Left carotid system: The left common carotid artery is within normal limits. Atherosclerotic changes are slightly more prominent within the left carotid bifurcation. There is no significant stenosis relative to the more distal vessel. The cervical left ICA is normal. Vertebral arteries:The vertebral arteries both originate from the subclavian arteries without significant stenosis. The left vertebral artery is dominant. There is no focal stenosis or vascular injury within the cervical vertebral arteries. Skeleton: Mild endplate degenerative changes are most noted at  C3-4 and C4-5. No focal lytic or blastic lesions are present. Other neck: The soft tissues of the neck are unremarkable. CTA HEAD Anterior circulation: The internal carotid arteries are within normal limits from the high cervical segments through the ICA termini. The left A1 segment is dominant. The M1 segments are intact bilaterally. There is a significant proximal stenosis in the more lateral left M2 segment with significant attenuation of more distal branch vessels no focal proximal occlusion is present. Right MCA and bilateral ACA branch vessels are within normal limits. Posterior circulation: The left vertebral artery is slightly dominant to the right. The PICA origins are visualized and normal bilaterally. Basilar artery is within normal limits. Both posterior cerebral arteries originate the basilar tip. PCA branch vessels are intact. Venous sinuses: The dural sinuses are patent. The right transverse sinus is dominant. The straight sinus and deep cerebral veins are patent. Cortical veins are intact. Anatomic variants: None Delayed phase: Postcontrast images better demonstrate the areas of developing acute nonhemorrhagic infarction involving the posterior left insular cortex, left temporal lobe, and left parietal lobe. No pathologic enhancement is evident. IMPRESSION: 1. Developing acute/subacute nonhemorrhagic  infarct of posterior left insular cortex, superior left temporal, and left parietal lobe. 2. Moderate to severe focal stenosis within the more lateral left M2 branch with significant attenuation of distal MCA branch vessels corresponding to the infarct territory. 3. No other significant proximal stenosis, aneurysm, or branch vessel occlusion. These results were called by telephone at the time of interpretation on 05/26/2015 at 12:51 Pm to Dr. Aram Beecham, who verbally acknowledged these results. Electronically Signed   By: San Morelle M.D.   On: 05/26/2015 13:10   Ct Cerebral Perfusion W/cm  05/26/2015  ADDENDUM REPORT: 05/26/2015 13:11 ADDENDUM: These results were called by telephone at the time of interpretation on 05/26/2015 at 12:51 pm to Dr. Aram Beecham, who verbally acknowledged these results. Electronically Signed   By: San Morelle M.D.   On: 05/26/2015 13:11  05/26/2015  CLINICAL DATA:  Altered mental status.  Mental SP. EXAM: CT CEREBRAL PERFUSION WITH CONTRAST TECHNIQUE: Dynamic imaging was performed following the pulse injection of contrast. Para metric maps were subsequently calculated. CONTRAST:  73mL OMNIPAQUE IOHEXOL 350 MG/ML SOLN COMPARISON:  CT head without contrast from the same date. FINDINGS: Being time to transit is ext the and cerebral blood flow is diminished in the superior left temporal lobe and left parietal lobe within the posterior left MCA distribution. This involves posterior aspect of the left insular ribbon. There is less profound decrease in cerebral blood volume within the same territory. Less than 1/3 of the left MCA territory constitutes core infarct. Parameters are within normal limits in the right hemisphere IMPRESSION: 1. Large area of ischemia involving the posterior aspect of the left MCA territory. This involves the posterior left insular ribbon, superior left temporal lobe, and left parietal white. 2. More subtle cerebral blood volume changes with the area  of core infarct constituting less than 1/3 of the left MCA territory. Electronically Signed: By: San Morelle M.D. On: 05/26/2015 12:51   I have personally reviewed and evaluated these images and lab results as part of my medical decision-making.  ECG interpretation   Date: 05/26/2015  Rate: 73  Rhythm: normal sinus rhythm  QRS Axis: normal  Intervals: normal  ST/T Wave abnormalities: normal  Conduction Disutrbances: none  Narrative Interpretation:   Old EKG Reviewed: No significant changes noted     MDM   Final diagnoses:  Aphasia  Global  aphasia  Cerebrovascular accident (CVA), unspecified mechanism ([place])    8:58 AM Symptoms concerning for stroke.  Patient just underwent stat head CT which demonstrates no obvious acute masses or bleeds.  Suspect left brain stroke as the cause of her symptoms.  Last seen well at 11:30 PM last night and therefore she is outside the window for TPA.  She is also on Coumadin.  I will involve neurology early and just spoke with Dr. Rosebud Poles with patient at bedside.  Stroke order set utilized  10:29 AM Unable to obtain appropriate sized access for CT angiogram head and CT perfusion head.  Neurology updated.  Initially 18-gauge ultrasound guided IV was placed into the left cephalic vein.  This initially had good flow but it appears to have infiltrated this point.  No other access points of sufficient size or quality are able to be obtained.  Family updated.  Patient will undergo MR brain as well as MR MRA head neck and admission to the hospital.  Patient is outside the timeframe for IV thrombolytics.  She is on Coumadin.  INR is pending.  10:31 AM We'll contact IV team to see if they can place a timely PICC line in this patient as my CT imaging team says this would be sufficient for access.  This usually is not the case and we do not usually have the ability to do this but I will attempt.  1:31 PM CTA and CT perfusion without any proximal  clot can be obtained from an interventional radiology standpoint.  Patient will be admitted to internal medicine.    Jola Schmidt, MD 05/26/15 [place], MD 05/26/15 [phone]

## 2015-05-26 NOTE — ED Notes (Signed)
Internal Medicine at bedside.  

## 2015-05-26 NOTE — Progress Notes (Signed)
ANTICOAGULATION CONSULT NOTE - Initial Consult  Pharmacy Consult for heparin Indication: history of DVT  Allergies  Allergen Reactions  . Kiwi Extract Anaphylaxis, Hives and Swelling    Makes her swell all over   . Aspirin Nausea Only    Can take as long as enteric coated    Patient Measurements: Height: 4\' 11"  (149.9 cm) Weight: 239 lb 6.7 oz (108.6 kg) IBW/kg (Calculated) : 43.2 Heparin Dosing Weight: 70.4kg  Vital Signs: Temp: 98 F (36.7 C) (12/17 1535) Temp Source: Oral (12/17 1535) BP: 142/74 mmHg (12/17 1535) Pulse Rate: 70 (12/17 1535)  Labs:  Recent Labs  05/26/15 0929 05/26/15 0934 05/26/15 1145  HGB 15.2* 17.7*  --   HCT 47.7* 52.0*  --   PLT 215  --   --   APTT  --   --  30  LABPROT  --   --  19.1*  INR  --   --  1.61*  CREATININE 1.06* 1.00  --     Estimated Creatinine Clearance: 68 mL/min (by C-G formula based on Cr of 1).   Medical History: Past Medical History  Diagnosis Date  . Hypertension   . Asthma   . COPD (chronic obstructive pulmonary disease) ([place])   . Anxiety   . Cancer ([place])   . Colon cancer ([place])   . Dyslipidemia   . Migraine headache   . Chronic bronchitis   . Uterine fibroid   . Ovarian cyst   . GERD (gastroesophageal reflux disease)   . Morbid obesity ([place])   . Obstructive sleep apnea     mild  . Chronic pain   . Chronic back pain   . Arthritis   . Osteoarthritis   . Depression   . DVT (deep vein thrombosis) in pregnancy   . DVT (deep venous thrombosis) ([facility])     Medications:  Prescriptions prior to admission  Medication Sig Dispense Refill Last Dose  . acetaminophen (TYLENOL) 500 MG tablet Take 500 mg by mouth every 6 (six) hours as needed for headache.   05/25/2015 at Unknown time  . albuterol (PROVENTIL HFA;VENTOLIN HFA) 108 (90 BASE) MCG/ACT inhaler Inhale 2 puffs into the lungs every 6 (six) hours as needed for wheezing or shortness of breath (shortness of breath).   05/25/2015 at Unknown time  .  amitriptyline (ELAVIL) 50 MG tablet Take 50 mg by mouth at bedtime.   05/25/2015 at Unknown time  . citalopram (CELEXA) 20 MG tablet Take 20 mg by mouth daily.   05/25/2015 at Unknown time  . diphenhydrAMINE (BENADRYL) 25 MG tablet Take 1 tablet (25 mg total) by mouth every 6 (six) hours as needed (at first sign of headache). (Patient taking differently: Take 25 mg by mouth every 6 (six) hours as needed for itching or allergies. ) 10 tablet 0 Past Month at Unknown time  . furosemide (LASIX) 20 MG tablet Take 1 tablet (20 mg total) by mouth daily. 30 tablet 2 05/25/2015 at Unknown time  . gabapentin (NEURONTIN) 300 MG capsule Take 1 capsule (300 mg total) by mouth at bedtime. 30 capsule 0 05/25/2015 at Unknown time  . LORazepam (ATIVAN) 1 MG tablet Take 1 tablet (1 mg total) by mouth every 6 (six) hours as needed for anxiety (anxiety). 30 tablet 0 05/25/2015 at Unknown time  . omeprazole (PRILOSEC) 40 MG capsule Take 1 capsule (40 mg total) by mouth daily. 30 capsule 3 05/25/2015 at Unknown time  . oxyCODONE-acetaminophen (PERCOCET) 7.5-325 MG tablet Take 1 tablet  by mouth every 6 (six) hours as needed for moderate pain. pain  0 05/25/2015 at Unknown time  . potassium chloride SA (K-DUR,KLOR-CON) 20 MEQ tablet Take 1 tablet (20 mEq total) by mouth daily. 10 tablet 0 05/25/2015 at Unknown time  . warfarin (COUMADIN) 5 MG tablet Take 1 tablet (5 mg total) by mouth daily. 30 tablet 2 05/25/2015 at Unknown time  . B Complex Vitamins (VITAMIN-B COMPLEX PO) Take 1 tablet by mouth daily.   04/20/2015 at Unknown time  . cyclobenzaprine (FLEXERIL) 10 MG tablet Take 1 tablet (10 mg total) by mouth 2 (two) times daily as needed for muscle spasms. 20 tablet 0 04/21/2015 at Unknown time  . ipratropium-albuterol (DUONEB) 0.5-2.5 (3) MG/3ML SOLN Take 3 mLs by nebulization every 6 (six) hours. (Patient not taking: Reported on 05/26/2015) 360 mL 1 Not Taking at Unknown time   Scheduled:  . antiseptic oral rinse  7 mL  Mouth Rinse BID  . aspirin  300 mg Rectal Daily  . sodium chloride  10-40 mL Intracatheter Q12H  . sodium chloride  3 mL Intravenous Q12H    Assessment: 59 yo female here with aphasia for r/o CVA (no TPA given). She is noted on coumadin PTA for history of DVT and pharmacy has been consulted to dose heparin. -INR= 1.61  Goal of Therapy:  Heparin level = 0.3-0.5 Monitor platelets by anticoagulation protocol: Yes   Plan:  -No heparin bolus -Begin heparin at 1000 units/hr (~ 14 units/kg/hr) -Heparin level in 8 hours and daily wth CBC daily  Hildred Laser, Pharm D 05/26/2015 4:42 PM

## 2015-05-26 NOTE — ED Notes (Signed)
IM paged @ [phone]

## 2015-05-26 NOTE — H&P (Signed)
Date: 05/26/2015               Patient Name:  Brooke [name] MRN: [id number]  DOB: [birth year] Age / Sex: 59 y.o., female   PCP: Lorayne Marek, MD         Medical Service: Internal Medicine Teaching Service         Attending Physician: Dr. Oval Linsey, MD    First Contact: Dr. Marlowe Sax Pager: [phone]  Second Contact: Dr. 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 History was obtained from the patient's daughter. Daughter states patient lives with her. States around 7:46 am, family found patient to be confused, unable to talk, and was not responding to questions. They also noted she has facial droop. Daughter states patient's condition has stayed the same since morning and has not improved. States patient is able to understand words that are spoken to her, however, is struggling to reply because she cannot find the right words. Daughter states patient was in his usual state of health until 11:30 PM last night when she noticed noticed patient was "dozing off" and dropped the ice-cream she was eating. Daughter states she was not too concerned at that time because patient was not having any facial droop and was talking normally. She also recalls about 1 week ago patient was having a similar speech problem and needed help with walking/ putting on her clothes just for one day. As per daughter, patient has been using a spray in her nose that has opiates in which was not prescribed to her and she got it from someone else.   Note: Patient has expressive  aphasia. Daughter wishes for the patient to be full code.  Meds: Current Facility-Administered Medications  Medication Dose Route Frequency Provider Last Rate Last Dose  . 0.9 %  sodium chloride infusion   Intravenous Continuous Alexa Sherral Hammers, MD      . antiseptic oral rinse (CPC / CETYLPYRIDINIUM CHLORIDE 0.05%) solution 7 mL  7 mL Mouth Rinse BID Oval Linsey, MD      . aspirin suppository 300 mg  300 mg Rectal Daily Amie Portland, MD   300 mg at 05/26/15 1340  . ipratropium-albuterol (DUONEB) 0.5-2.5 (3) MG/3ML nebulizer solution 3 mL  3 mL Nebulization Q6H PRN Alexa Sherral Hammers, MD      . LORazepam (ATIVAN) injection 1 mg  1 mg Intravenous Q6H PRN Alexa Sherral Hammers, MD      . morphine 2 MG/ML injection 1 mg  1 mg Intravenous Q6H PRN Alexa Sherral Hammers, MD      . ondansetron (ZOFRAN) tablet 4 mg  4 mg Oral Q6H PRN Alexa Sherral Hammers, MD       Or  . ondansetron (ZOFRAN) injection 4 mg  4 mg Intravenous Q6H PRN Alexa Sherral Hammers, MD      . sodium chloride 0.9 % injection 10-40 mL  10-40 mL Intracatheter Q12H Jola Schmidt, MD      . sodium chloride 0.9 % injection 10-40 mL  10-40 mL Intracatheter PRN Jola Schmidt, MD      .  sodium chloride 0.9 % injection 3 mL  3 mL Intravenous Q12H Alexa Sherral Hammers, MD        Allergies: Allergies as of 05/26/2015 - Review Complete 05/26/2015  Allergen Reaction Noted  . Kiwi extract Anaphylaxis, Hives, and Swelling 06/19/2011  . Aspirin Nausea Only 07/28/2011   Past Medical History  Diagnosis Date  . Hypertension   . Asthma   . COPD (chronic obstructive pulmonary disease) ([place])   . Anxiety   . Cancer ([place])   . Colon cancer ([place])   . Dyslipidemia   . Migraine headache   . Chronic bronchitis   . Uterine fibroid   . Ovarian cyst   . GERD (gastroesophageal reflux disease)   . Morbid obesity ([place])   . Obstructive sleep apnea     mild  . Chronic pain   . Chronic back pain   . Arthritis   . Osteoarthritis   . Depression   . DVT  (deep vein thrombosis) in pregnancy   . DVT (deep venous thrombosis) [facility])    Past Surgical History  Procedure Laterality Date  . Knee arthroscopy      bil.  . Carpal tunnel release      rt  . Mouth surgery      teeth extraction  . Cesarean section    . Subtotal colectomy    . Colonoscopy    . Tubal ligation     Family History  Problem Relation Age of Onset  . Uterine cancer Mother   . Stroke Father   . Colon cancer Maternal Uncle   . Diabetes Father   . Ovarian cancer Mother   . Hypertension Father   . Breast cancer Maternal Grandmother   . Diabetes Sister   . Asthma Child   . Asthma Child    Social History   Social History  . Marital Status: Widowed    Spouse Name: N/A  . Number of Children: 2  . Years of Education: N/A   Occupational History  . disabled    Social History Main Topics  . Smoking status: Current Some Day Smoker -- 1.00 packs/day for 37 years    Types: Cigarettes  . Smokeless tobacco: Never Used     Comment: trying to quit, down to .5ppd X2 years ago.   . Alcohol Use: No  . Drug Use: No  . Sexual Activity: Yes   Other Topics Concern  . Not on file   Social History Narrative   Lives alone.  Widowed but has a female companion who helps out.  Ambulates with a cane.  Drinks coffee daily.             Review of Systems: Could not be done because patient has expressive aphasia.   Physical Exam: Blood pressure 142/74, pulse 70, temperature 98 F (36.7 C), temperature source Oral, resp. rate 16, height 4\' 11"  (1.499 m), weight 108.6 kg (239 lb 6.7 oz), last menstrual period 05/24/2003, SpO2 98 %. Physical Exam  Constitutional: She appears well-developed and well-nourished.  Obese female   HENT:  Head: Normocephalic and atraumatic.  Mouth/Throat: Oropharynx is clear and moist.  Eyes: EOM are normal. Pupils are equal, round, and reactive to light.  Neck: Neck supple. No tracheal deviation present.  Cardiovascular: Normal rate, regular rhythm  and intact distal pulses.   Pulmonary/Chest: Effort normal and breath sounds normal. No respiratory distress. She has no rales.  Mild scattered wheezes  Abdominal: Soft. Bowel sounds are normal. She exhibits no distension.  There is no tenderness.  Musculoskeletal: She exhibits no edema.  Neurological: She is alert.  Expressive aphasia. Patient was following commands. Right-sided facial droop noted. Strength 3 /5 and no sensation in right upper extremity. Strength and sensation intact in the left upper extremity. It was difficult to assess strength and sensation in the patient's lower extremities.   Skin: Skin is warm and dry.    Lab results: Basic Metabolic Panel:  Recent Labs  05/26/15 0929 05/26/15 0934  NA 138 140  K 5.2* 5.6*  CL 105 107  CO2 23  --   GLUCOSE 95 94  BUN 9 14  CREATININE 1.06* 1.00  CALCIUM 9.0  --    Liver Function Tests:  Recent Labs  05/26/15 0929  AST 24  ALT 13*  ALKPHOS 85  BILITOT 1.0  PROT 7.5  ALBUMIN 3.7   CBC:  Recent Labs  05/26/15 0929 05/26/15 0934  WBC 6.5  --   NEUTROABS 3.4  --   HGB 15.2* 17.7*  HCT 47.7* 52.0*  MCV 100.6*  --   PLT 215  --    CBG:  Recent Labs  05/26/15 0859  GLUCAP 85   Coagulation:  Recent Labs  05/26/15 1145  LABPROT 19.1*  INR 1.61*   Urine Drug Screen: Drugs of Abuse     Component Value Date/Time   LABOPIA NONE DETECTED 05/26/2015 1320   COCAINSCRNUR NONE DETECTED 05/26/2015 1320   LABBENZ NONE DETECTED 05/26/2015 1320   AMPHETMU NONE DETECTED 05/26/2015 1320   THCU NONE DETECTED 05/26/2015 1320   LABBARB NONE DETECTED 05/26/2015 1320    Alcohol Level:  Recent Labs  05/26/15 0929  ETH <5   Urinalysis:  Recent Labs  05/26/15 1319  COLORURINE YELLOW  LABSPEC >1.046*  PHURINE 5.5  GLUCOSEU NEGATIVE  HGBUR NEGATIVE  BILIRUBINUR NEGATIVE  KETONESUR NEGATIVE  PROTEINUR NEGATIVE  NITRITE NEGATIVE  LEUKOCYTESUR NEGATIVE    Imaging results:  Ct Angio Head W/cm  &/or Wo Cm  05/26/2015  CLINICAL DATA:  Neck up stroke. Altered mental status. Unable to speak clearly. Right-sided weakness and facial droop. EXAM: CT ANGIOGRAPHY HEAD AND NECK TECHNIQUE: Multidetector CT imaging of the head and neck was performed using the standard protocol during bolus administration of intravenous contrast. Multiplanar CT image reconstructions and MIPs were obtained to evaluate the vascular anatomy. Carotid stenosis measurements (when applicable) are obtained utilizing NASCET criteria, using the distal internal carotid diameter as the denominator. CONTRAST:  67mL OMNIPAQUE IOHEXOL 350 MG/ML SOLN COMPARISON:  CT profusion study from the same day. CT head without contrast from the same day. FINDINGS: CT HEAD Brain: Source images confirm an acute infarct involving the posterior left insular ribbon is well is the cortex over the superior left temporal lobe and left parietal lobe. No acute hemorrhage or mass lesion is present. The ganglia are intact. Calvarium and skull base: Negative. Paranasal sinuses: Clear Orbits: Within normal limits. CTA NECK Aortic arch: A 3 vessel arch configuration is present. Atherosclerotic calcifications are present without significant stenoses at the origins the great vessels. Right carotid system: The right common carotid artery is within normal limits. Minimal atherosclerotic changes are present at the carotid bifurcation. The cervical right ICA is normal. Left carotid system: The left common carotid artery is within normal limits. Atherosclerotic changes are slightly more prominent within the left carotid bifurcation. There is no significant stenosis relative to the more distal vessel. The cervical left ICA is normal. Vertebral arteries:The vertebral arteries both originate from the  subclavian arteries without significant stenosis. The left vertebral artery is dominant. There is no focal stenosis or vascular injury within the cervical vertebral arteries. Skeleton:  Mild endplate degenerative changes are most noted at C3-4 and C4-5. No focal lytic or blastic lesions are present. Other neck: The soft tissues of the neck are unremarkable. CTA HEAD Anterior circulation: The internal carotid arteries are within normal limits from the high cervical segments through the ICA termini. The left A1 segment is dominant. The M1 segments are intact bilaterally. There is a significant proximal stenosis in the more lateral left M2 segment with significant attenuation of more distal branch vessels no focal proximal occlusion is present. Right MCA and bilateral ACA branch vessels are within normal limits. Posterior circulation: The left vertebral artery is slightly dominant to the right. The PICA origins are visualized and normal bilaterally. Basilar artery is within normal limits. Both posterior cerebral arteries originate the basilar tip. PCA branch vessels are intact. Venous sinuses: The dural sinuses are patent. The right transverse sinus is dominant. The straight sinus and deep cerebral veins are patent. Cortical veins are intact. Anatomic variants: None Delayed phase: Postcontrast images better demonstrate the areas of developing acute nonhemorrhagic infarction involving the posterior left insular cortex, left temporal lobe, and left parietal lobe. No pathologic enhancement is evident. IMPRESSION: 1. Developing acute/subacute nonhemorrhagic infarct of posterior left insular cortex, superior left temporal, and left parietal lobe. 2. Moderate to severe focal stenosis within the more lateral left M2 branch with significant attenuation of distal MCA branch vessels corresponding to the infarct territory. 3. No other significant proximal stenosis, aneurysm, or branch vessel occlusion. These results were called by telephone at the time of interpretation on 05/26/2015 at 12:51 Pm to Dr. Aram Beecham, who verbally acknowledged these results. Electronically Signed   By: San Morelle M.D.   On:  05/26/2015 13:10   Ct Head Wo Contrast  05/26/2015  CLINICAL DATA:  Unresponsive EXAM: CT HEAD WITHOUT CONTRAST TECHNIQUE: Contiguous axial images were obtained from the base of the skull through the vertex without intravenous contrast. COMPARISON:  04/21/2015 FINDINGS: Bony calvarium is intact. No acute hemorrhage or space-occupying mass lesion is noted. There is some blunting of the sulcal markings in the left parietal lobe near the vertex which may represent some very early ischemia. No other focal abnormality is noted. IMPRESSION: Question early ischemia in distribution of the left middle cerebral artery. No other focal abnormality is noted. These results were called by telephone at the time of interpretation on 05/26/2015 at 9:11 am to Dr. Jola Schmidt , who verbally acknowledged these results. Electronically Signed   By: Inez Catalina M.D.   On: 05/26/2015 09:11   Ct Angio Neck W/cm &/or Wo/cm  05/26/2015  CLINICAL DATA:  Neck up stroke. Altered mental status. Unable to speak clearly. Right-sided weakness and facial droop. EXAM: CT ANGIOGRAPHY HEAD AND NECK TECHNIQUE: Multidetector CT imaging of the head and neck was performed using the standard protocol during bolus administration of intravenous contrast. Multiplanar CT image reconstructions and MIPs were obtained to evaluate the vascular anatomy. Carotid stenosis measurements (when applicable) are obtained utilizing NASCET criteria, using the distal internal carotid diameter as the denominator. CONTRAST:  59mL OMNIPAQUE IOHEXOL 350 MG/ML SOLN COMPARISON:  CT profusion study from the same day. CT head without contrast from the same day. FINDINGS: CT HEAD Brain: Source images confirm an acute infarct involving the posterior left insular ribbon is well is the cortex over the superior left temporal lobe and  left parietal lobe. No acute hemorrhage or mass lesion is present. The ganglia are intact. Calvarium and skull base: Negative. Paranasal sinuses:  Clear Orbits: Within normal limits. CTA NECK Aortic arch: A 3 vessel arch configuration is present. Atherosclerotic calcifications are present without significant stenoses at the origins the great vessels. Right carotid system: The right common carotid artery is within normal limits. Minimal atherosclerotic changes are present at the carotid bifurcation. The cervical right ICA is normal. Left carotid system: The left common carotid artery is within normal limits. Atherosclerotic changes are slightly more prominent within the left carotid bifurcation. There is no significant stenosis relative to the more distal vessel. The cervical left ICA is normal. Vertebral arteries:The vertebral arteries both originate from the subclavian arteries without significant stenosis. The left vertebral artery is dominant. There is no focal stenosis or vascular injury within the cervical vertebral arteries. Skeleton: Mild endplate degenerative changes are most noted at C3-4 and C4-5. No focal lytic or blastic lesions are present. Other neck: The soft tissues of the neck are unremarkable. CTA HEAD Anterior circulation: The internal carotid arteries are within normal limits from the high cervical segments through the ICA termini. The left A1 segment is dominant. The M1 segments are intact bilaterally. There is a significant proximal stenosis in the more lateral left M2 segment with significant attenuation of more distal branch vessels no focal proximal occlusion is present. Right MCA and bilateral ACA branch vessels are within normal limits. Posterior circulation: The left vertebral artery is slightly dominant to the right. The PICA origins are visualized and normal bilaterally. Basilar artery is within normal limits. Both posterior cerebral arteries originate the basilar tip. PCA branch vessels are intact. Venous sinuses: The dural sinuses are patent. The right transverse sinus is dominant. The straight sinus and deep cerebral veins are  patent. Cortical veins are intact. Anatomic variants: None Delayed phase: Postcontrast images better demonstrate the areas of developing acute nonhemorrhagic infarction involving the posterior left insular cortex, left temporal lobe, and left parietal lobe. No pathologic enhancement is evident. IMPRESSION: 1. Developing acute/subacute nonhemorrhagic infarct of posterior left insular cortex, superior left temporal, and left parietal lobe. 2. Moderate to severe focal stenosis within the more lateral left M2 branch with significant attenuation of distal MCA branch vessels corresponding to the infarct territory. 3. No other significant proximal stenosis, aneurysm, or branch vessel occlusion. These results were called by telephone at the time of interpretation on 05/26/2015 at 12:51 Pm to Dr. Aram Beecham, who verbally acknowledged these results. Electronically Signed   By: San Morelle M.D.   On: 05/26/2015 13:10   Ct Cerebral Perfusion W/cm  05/26/2015  ADDENDUM REPORT: 05/26/2015 13:11 ADDENDUM: These results were called by telephone at the time of interpretation on 05/26/2015 at 12:51 pm to Dr. Aram Beecham, who verbally acknowledged these results. Electronically Signed   By: San Morelle M.D.   On: 05/26/2015 13:11  05/26/2015  CLINICAL DATA:  Altered mental status.  Mental SP. EXAM: CT CEREBRAL PERFUSION WITH CONTRAST TECHNIQUE: Dynamic imaging was performed following the pulse injection of contrast. Para metric maps were subsequently calculated. CONTRAST:  64mL OMNIPAQUE IOHEXOL 350 MG/ML SOLN COMPARISON:  CT head without contrast from the same date. FINDINGS: Being time to transit is ext the and cerebral blood flow is diminished in the superior left temporal lobe and left parietal lobe within the posterior left MCA distribution. This involves posterior aspect of the left insular ribbon. There is less profound decrease in cerebral blood volume within the  same territory. Less than 1/3 of the left MCA  territory constitutes core infarct. Parameters are within normal limits in the right hemisphere IMPRESSION: 1. Large area of ischemia involving the posterior aspect of the left MCA territory. This involves the posterior left insular ribbon, superior left temporal lobe, and left parietal white. 2. More subtle cerebral blood volume changes with the area of core infarct constituting less than 1/3 of the left MCA territory. Electronically Signed: By: San Morelle M.D. On: 05/26/2015 12:51    Other results: EKG: No acute ST or T-wave changes  Assessment & Plan by Problem: Active Problems:   Stroke [facility])   Acute ischemic stroke ([place])  Acute ischemic stroke Patient is presenting with acute onset right-sided weakness, right facial droop, and expressive aphasia. She does have risk factors including obesity, hypertension, and hyperlipidemia. CT of head without contrast showed questionable early ischemia in the distribution of the left middle cerebral artery. CTA showing developing acute/subacute nonhemorrhagic infarct of posterior left insular cortex, superior left temporal, and left parietal lobe. In addition, moderate to severe focal stenosis within the more lateral left M2 branch with significant attenuation of distal MCA branch vessels corresponding to the infarct territory. CT cerebral perfusion showing a large area of ischemia involving the posterior aspect of the left MCA territory. More subtle cerebral blood volume changes with the area of core infarct constituting less than 1/3 of the left MCA territory. Neurology is on board and they discussed the case with neuroradiology - no proximal MCA occlusion amenable to endovascular revascularization. Patient has been given aspirin 325 mg.  -Admitted to telemetry -Appreciate neurology recommendations. -Aspirin 300 mg rectal suppository daily -Ativan 1 mg every 6 hours as needed for anxiety -Normal saline at 75 mL per hour as patient is not able to  tolerate by mouth intake -Follow-up a.m. EKG -Pending echo -Pending carotid Dopplers -Pending labs: CBC, BMP, lipid panel, A1c -Neuro checks every 2 hours -PT/OT eval and treat  History of unprovoked DVT and PE: Patient is on lifelong anticoagulation with Coumadin. -Hold Coumadin because patient is NPO -Heparin per pharmacy  Hyperkalemia: Potassium 5.6 on admission likely due to being on potassium supplement (patient is taking Lasix at home). -Hold Lasix and potassium supplement  COPD: Patient had mild scattered wheezes on exam today. -DuoNeb every 6 hours  Hypertension: Hold home antihypertensive (Lasix) to allow for permissive hypertension.   Hyperlipidemia: Patient is not on a statin at home. -Follow-up lipid panel results and consider starting a statin once patient can tolerate by mouth intake.  Diet: Nothing by mouth as recommended by SLP  DVT prophylaxis: Heparin per pharmacy  Code: Full  Dispo: Disposition is deferred at this time, awaiting improvement of current medical problems. Anticipated discharge in approximately 2-3 day(s).   The patient does have a current PCP (Lorayne Marek, MD) and does need an [facility] hospital follow-up appointment after discharge.  The patient does not have transportation limitations that hinder transportation to clinic appointments.  Signed: Shela Leff, MD 05/26/2015, 4:39 PM

## 2015-05-26 NOTE — ED Notes (Signed)
Attempted report 

## 2015-05-26 NOTE — ED Notes (Signed)
Pt taken to MRI.  MRI called and stated pt not sitting still for pt.

## 2015-05-26 NOTE — ED Notes (Signed)
LSN last night.  Grandaughter found pt sitting on couch having difficulty speaking.

## 2015-05-26 NOTE — Consult Note (Signed)
Referring Physician: Dr Venora Maples, ED    Chief Complaint: aphasia  HPI:                                                                                                                                         Brooke [name] is an 59 y.o. female with a past medical history significant for HTN, HLD, migraine, morbid obesity, colon cancer s/p subtotal colectomy, DVT on coumadin, COPD, and anxiety, brought in by EMS due to new onset aphasia. Family reports that she was normal around 11 pm last night and approximately at 8 am this morning was found by her granddaughter sitting in a chair " looking confused and unable to speak". EMS was summoned and upon arrival to the ED was confirmed to have receptive>expressive aphasia but no focal weakness. CT brain was personally reviewed and there is question early ischemia in distribution of the left middle cerebral artery.  INR 1.61 Presently, she is alert and awake but with global aphasia that is receptive>>expressive. Of note, patient family member reports that she was using an inhaler that has Fentanyl on it.  Date last known well: 05/25/15 Time last known well: 11 pm tPA Given: no, out of the window   Past Medical History  Diagnosis Date  . Hypertension   . Asthma   . COPD (chronic obstructive pulmonary disease) ([place])   . Anxiety   . Cancer ([place])   . Colon cancer ([place])   . Dyslipidemia   . Migraine headache   . Chronic bronchitis   . Uterine fibroid   . Ovarian cyst   . GERD (gastroesophageal reflux disease)   . Morbid obesity ([place])   . Obstructive sleep apnea     mild  . Chronic pain   . Chronic back pain   . Arthritis   . Osteoarthritis   . Depression   . DVT (deep vein thrombosis) in pregnancy   . DVT (deep venous thrombosis) [facility])     Past Surgical History  Procedure Laterality Date  . Knee arthroscopy      bil.  . Carpal tunnel release      rt  . Mouth surgery      teeth extraction  . Cesarean section    . Subtotal  colectomy    . Colonoscopy    . Tubal ligation      Family History  Problem Relation Age of Onset  . Uterine cancer Mother   . Stroke Father   . Colon cancer Maternal Uncle   . Diabetes Father   . Ovarian cancer Mother   . Hypertension Father   . Breast cancer Maternal Grandmother   . Diabetes Sister   . Asthma Child   . Asthma Child    Social History:  reports that she has been smoking Cigarettes.  She has a 37 pack-year smoking history. She has never used smokeless tobacco. She  reports that she does not drink alcohol or use illicit drugs. Family history: unable to obtain due to aphasia Allergies:  Allergies  Allergen Reactions  . Kiwi Extract Anaphylaxis, Hives and Swelling    Makes her swell all over   . Aspirin Nausea Only    Can take as long as enteric coated    Medications:                                                                                                                           I have reviewed the patient's current medications.  ROS:                                                                                                                                       History obtained from unobtainable from patient due to global aphasia    Physical exam:  Constitutional: obese,no apparent distress. Blood pressure 129/93, pulse 89, temperature 97.9 F (36.6 C), temperature source Oral, resp. rate 19, height 4\' 11"  (1.499 m), weight 108.6 kg (239 lb 6.7 oz), last menstrual period 05/24/2003, SpO2 100 %. Eyes: no jaundice or exophthalmos.  Head: normocephalic. Neck: supple, no bruits, no JVD. Cardiac: no murmurs. Lungs: clear. Abdomen: soft, no tender, no mass. Extremities: no edema, clubbing, or cyanosis.  Skin: no rash  Neurologic Examination:                                                                                                      General: NAD Mental Status: Alert, oriented, thought content appropriate.  Speech fluent without evidence  of aphasia.  Able to follow 3 step commands without difficulty. Cranial Nerves: II: Discs flat bilaterally; Visual fields grossly normal, pupils equal, round, reactive to light and accommodation III,IV, VI: ptosis not present, question of mild left gaze preference V,VII: smile asymmetric due to mild right lower face weakness, facial light touch sensation unreliable. VIII: hearing normal bilaterally IX,X: uvula rises symmetrically XI: bilateral  shoulder shrug unable to test XII: midline tongue extension without atrophy or fasciculations Motor: Seems to move all limbs symmetrically Tone and bulk:normal tone throughout; no atrophy noted Sensory: reacts to noxious stimuli Deep Tendon Reflexes:  1 all oover Plantars: Right: downgoing   Left: downgoing Cerebellar: Unable to test due to receptive aphasia Gait:  Unable to test due to multiple leads    No results found for this or any previous visit (from the past 48 hour(s)). Ct Head Wo Contrast  05/26/2015  CLINICAL DATA:  Unresponsive EXAM: CT HEAD WITHOUT CONTRAST TECHNIQUE: Contiguous axial images were obtained from the base of the skull through the vertex without intravenous contrast. COMPARISON:  04/21/2015 FINDINGS: Bony calvarium is intact. No acute hemorrhage or space-occupying mass lesion is noted. There is some blunting of the sulcal markings in the left parietal lobe near the vertex which may represent some very early ischemia. No other focal abnormality is noted. IMPRESSION: Question early ischemia in distribution of the left middle cerebral artery. No other focal abnormality is noted. These results were called by telephone at the time of interpretation on 05/26/2015 at 9:11 am to Dr. Jola Schmidt , who verbally acknowledged these results. Electronically Signed   By: Inez Catalina M.D.   On: 05/26/2015 09:11     Assessment: 59 y.o. female brought in with acute receptive>>expressive aphasia.  Woke up stroke, with CT already showing  question of early ischemia in distribution of the left middle cerebral artery.  She was last seen normal last night and therefore is not a candidate for iv thrombolysis. However, will approach this case as a woke up stroke and obtain CTA/CTP brain to determine whether or not there is a significant area of penumbra that could be salvage by endovascular intervention.   Stroke Risk Factors -  HTN, HLD, morbid obesity  Plan: 1. HgbA1c, fasting lipid panel 2. MRI, MRA  of the brain without contrast 3. Echocardiogram 4. Carotid dopplers 5. Prophylactic therapy- 6. Risk factor modification 7. Telemetry monitoring 8. Frequent neuro checks 9. PT/OT SLP  Dorian Pod, MD [facility] [phone]  05/26/2015, 9:22 AM   Addendum: CTA brain showed no significant proximal stenosis but moderate to severe focal stenosis within the more lateral left M2 branch with significant attenuation of distal MCA branch vesselscorresponding to the infarct territory. CTA neck: unimpressive. CTP: large area of ischemia involving the posterior aspect of the left MCA territory. This involves the posterior left insular ribbon,superior left temporal lobe, and left parietal white matter. More subtle cerebral blood volume changes with the area of core Infarct constituting less than 1/3 of the left MCA territory. Discussed with neuroradiologist, no proximal MCA occlusion amenable to endovascular revascularization. Stroke work up.  Dorian Pod, MD

## 2015-05-26 NOTE — Progress Notes (Signed)
Peripherally Inserted Central Catheter/Midline Placement  The IV Nurse has discussed with the patient and/or persons authorized to consent for the patient, the purpose of this procedure and the potential benefits and risks involved with this procedure.  The benefits include less needle sticks, lab draws from the catheter and patient may be discharged home with the catheter.  Risks include, but not limited to, infection, bleeding, blood clot (thrombus formation), and puncture of an artery; nerve damage and irregular heat beat.  Alternatives to this procedure were also discussed.  PICC/Midline Placement Documentation  PICC Double Lumen 05/26/15 PICC Right Brachial 40 cm 0 cm (Active)  Indication for Insertion or Continuance of Line Limited venous access - need for IV therapy >5 days (PICC only) 05/26/2015 11:39 AM  Exposed Catheter (cm) 0 cm 05/26/2015 11:39 AM  Site Assessment Clean;Dry;Intact 05/26/2015 11:39 AM  Lumen #1 Status Flushed;Saline locked;Blood return noted 05/26/2015 11:39 AM  Lumen #2 Status Flushed;Saline locked;Blood return noted 05/26/2015 11:39 AM  Dressing Type Transparent 05/26/2015 11:39 AM  Dressing Change Due 06/02/15 05/26/2015 11:39 AM       [place]ordan Payment 05/26/2015, 11:41 AM

## 2015-05-26 NOTE — ED Notes (Signed)
Accompanied pt to CT 

## 2015-05-26 NOTE — ED Notes (Signed)
Dr Reeves Forth at bedside.

## 2015-05-27 ENCOUNTER — Inpatient Hospital Stay (HOSPITAL_COMMUNITY): Payer: Medicare HMO

## 2015-05-27 DIAGNOSIS — I10 Essential (primary) hypertension: Secondary | ICD-10-CM

## 2015-05-27 DIAGNOSIS — I6789 Other cerebrovascular disease: Secondary | ICD-10-CM

## 2015-05-27 DIAGNOSIS — R531 Weakness: Secondary | ICD-10-CM

## 2015-05-27 DIAGNOSIS — R4701 Aphasia: Secondary | ICD-10-CM | POA: Insufficient documentation

## 2015-05-27 DIAGNOSIS — Z79899 Other long term (current) drug therapy: Secondary | ICD-10-CM

## 2015-05-27 DIAGNOSIS — I69351 Hemiplegia and hemiparesis following cerebral infarction affecting right dominant side: Secondary | ICD-10-CM

## 2015-05-27 DIAGNOSIS — Z7901 Long term (current) use of anticoagulants: Secondary | ICD-10-CM

## 2015-05-27 DIAGNOSIS — E785 Hyperlipidemia, unspecified: Secondary | ICD-10-CM

## 2015-05-27 LAB — BASIC METABOLIC PANEL
Anion gap: 9 (ref 5–15)
BUN: 7 mg/dL (ref 6–20)
CHLORIDE: 106 mmol/L (ref 101–111)
CO2: 25 mmol/L (ref 22–32)
Calcium: 8.5 mg/dL — ABNORMAL LOW (ref 8.9–10.3)
Creatinine, Ser: 0.79 mg/dL (ref 0.44–1.00)
GFR calc non Af Amer: >60 mL/min (ref >60)
Glucose, Bld: 92 mg/dL (ref 65–99)
POTASSIUM: 3.7 mmol/L (ref 3.5–5.1)
SODIUM: 140 mmol/L (ref 135–145)

## 2015-05-27 LAB — CBC
HEMATOCRIT: 41.2 % (ref 36.0–46.0)
Hemoglobin: 13.7 g/dL (ref 12.0–15.0)
MCH: 32.8 pg (ref 26.0–34.0)
MCHC: 33.3 g/dL (ref 30.0–36.0)
MCV: 98.6 fL (ref 78.0–100.0)
PLATELETS: 211 10*3/uL (ref 150–400)
RBC: 4.18 MIL/uL (ref 3.87–5.11)
RDW: 14.2 % (ref 11.5–15.5)
WBC: 6.3 10*3/uL (ref 4.0–10.5)

## 2015-05-27 LAB — HEPARIN LEVEL (UNFRACTIONATED)
HEPARIN UNFRACTIONATED: 0.21 [IU]/mL — AB (ref 0.30–0.70)
Heparin Unfractionated: <0.1 IU/mL — ABNORMAL LOW (ref 0.30–0.70)
Heparin Unfractionated: 0.75 IU/mL — ABNORMAL HIGH (ref 0.30–0.70)

## 2015-05-27 LAB — LIPID PANEL
Cholesterol: 139 mg/dL (ref 0–200)
HDL: 54 mg/dL (ref >40)
LDL CALC: 73 mg/dL (ref 0–99)
TRIGLYCERIDES: 62 mg/dL (ref <150)
Total CHOL/HDL Ratio: 2.6 RATIO
VLDL: 12 mg/dL (ref 0–40)

## 2015-05-27 LAB — TROPONIN I

## 2015-05-27 MED ORDER — LORAZEPAM 2 MG/ML IJ SOLN
1.0000 mg | Freq: Once | INTRAMUSCULAR | Status: AC
  Administered 2015-05-27: 1 mg via INTRAVENOUS
  Filled 2015-05-27: qty 1

## 2015-05-27 MED ORDER — MORPHINE SULFATE (PF) 2 MG/ML IV SOLN
2.0000 mg | INTRAVENOUS | Status: DC | PRN
  Administered 2015-05-27 – 2015-05-30 (×12): 2 mg via INTRAVENOUS
  Filled 2015-05-27 (×12): qty 1

## 2015-05-27 MED ORDER — CETYLPYRIDINIUM CHLORIDE 0.05 % MT LIQD
7.0000 mL | Freq: Two times a day (BID) | OROMUCOSAL | Status: DC
  Administered 2015-05-27 – 2015-05-30 (×5): 7 mL via OROMUCOSAL

## 2015-05-27 MED ORDER — CHLORHEXIDINE GLUCONATE 0.12 % MT SOLN
15.0000 mL | Freq: Two times a day (BID) | OROMUCOSAL | Status: DC
  Administered 2015-05-27 – 2015-05-30 (×6): 15 mL via OROMUCOSAL
  Filled 2015-05-27 (×6): qty 15

## 2015-05-27 MED ORDER — ATORVASTATIN CALCIUM 40 MG PO TABS
40.0000 mg | ORAL_TABLET | Freq: Every day | ORAL | Status: DC
  Administered 2015-05-28 – 2015-05-30 (×2): 40 mg via ORAL
  Filled 2015-05-27 (×2): qty 1

## 2015-05-27 MED ORDER — ATORVASTATIN CALCIUM 40 MG PO TABS: 40.0000 mg | ORAL_TABLET | Freq: Every day | ORAL | Status: DC

## 2015-05-27 NOTE — Progress Notes (Signed)
Subjective: Patient was seen and examined at bedside this morning. She has expressive aphasia but is able to understand what is said to her. Patient is following verbal commands.   Objective: Vital signs in last 24 hours: Filed Vitals:   05/27/15 0338 05/27/15 0653 05/27/15 0800 05/27/15 1015  BP: 157/89 166/97 151/84   Pulse: 82 89 75   Temp: 97.9 F (36.6 C) 98.4 F (36.9 C) 98.8 F (37.1 C)   TempSrc: Oral Axillary Oral   Resp: 17 18 18 16   Height:      Weight:      SpO2: 100% 99% 100%    Weight change:  No intake or output data in the 24 hours ending 05/27/15 1022   Physical Exam  Constitutional: Obese female. She appears well-developed and well-nourished. No acute distress.  Eyes: EOM are normal.  Cardiovascular: Normal rate, regular rhythm and intact distal pulses.  Pulmonary/Chest: Effort normal and breath sounds normal. No respiratory distress. She has no wheezes, rales, or rhonchi.  Abdominal: Soft. Bowel sounds are normal. She exhibits no distension. There is no tenderness.  Musculoskeletal: She exhibits no edema.  Neurological: She is alert.  Expressive aphasia. Patient was following commands. Right-sided facial droop noted. Strength 3 /5 and no sensation in right upper extremity. Strength and sensation intact in the left upper extremity. It was difficult to assess strength and sensation in the patient's lower extremities.  Skin: Skin is warm and dry.   Lab Results: Basic Metabolic Panel:  Recent Labs Lab 05/26/15 0929 05/26/15 0934 05/27/15 0110  NA 138 140 140  K 5.2* 5.6* 3.7  CL 105 107 106  CO2 23  --  25  GLUCOSE 95 94 92  BUN 9 14 7   CREATININE 1.06* 1.00 0.79  CALCIUM 9.0  --  8.5*   Liver Function Tests:  Recent Labs Lab 05/26/15 0929  AST 24  ALT 13*  ALKPHOS 85  BILITOT 1.0  PROT 7.5  ALBUMIN 3.7   CBC:  Recent Labs Lab 05/26/15 0929 05/26/15 0934 05/27/15 0110  WBC 6.5  --  6.3  NEUTROABS 3.4  --   --   HGB 15.2*  17.7* 13.7  HCT 47.7* 52.0* 41.2  MCV 100.6*  --  98.6  PLT 215  --  211   CBG:  Recent Labs Lab 05/26/15 0859  GLUCAP 85   Fasting Lipid Panel:  Recent Labs Lab 05/27/15 0110  CHOL 139  HDL 54  LDLCALC 73  TRIG 62  CHOLHDL 2.6   Coagulation:  Recent Labs Lab 05/26/15 1145  LABPROT 19.1*  INR 1.61*   Anemia Panel: No results for input(s): VITAMINB12, FOLATE, FERRITIN, TIBC, IRON, RETICCTPCT in the last 168 hours. Urine Drug Screen: Drugs of Abuse     Component Value Date/Time   LABOPIA NONE DETECTED 05/26/2015 1320   COCAINSCRNUR NONE DETECTED 05/26/2015 1320   LABBENZ NONE DETECTED 05/26/2015 1320   AMPHETMU NONE DETECTED 05/26/2015 1320   THCU NONE DETECTED 05/26/2015 1320   LABBARB NONE DETECTED 05/26/2015 1320    Alcohol Level:  Recent Labs Lab 05/26/15 0929  ETH <5   Urinalysis:  Recent Labs Lab 05/26/15 1319  COLORURINE YELLOW  LABSPEC >1.046*  PHURINE 5.5  GLUCOSEU NEGATIVE  HGBUR NEGATIVE  BILIRUBINUR NEGATIVE  KETONESUR NEGATIVE  PROTEINUR NEGATIVE  NITRITE NEGATIVE  LEUKOCYTESUR NEGATIVE   Micro Results: No results found for this or any previous visit (from the past 240 hour(s)). Studies/Results: Ct Angio Head W/cm &/or Wo Cm  05/26/2015  CLINICAL DATA:  Neck up stroke. Altered mental status. Unable to speak clearly. Right-sided weakness and facial droop. EXAM: CT ANGIOGRAPHY HEAD AND NECK TECHNIQUE: Multidetector CT imaging of the head and neck was performed using the standard protocol during bolus administration of intravenous contrast. Multiplanar CT image reconstructions and MIPs were obtained to evaluate the vascular anatomy. Carotid stenosis measurements (when applicable) are obtained utilizing NASCET criteria, using the distal internal carotid diameter as the denominator. CONTRAST:  35mL OMNIPAQUE IOHEXOL 350 MG/ML SOLN COMPARISON:  CT profusion study from the same day. CT head without contrast from the same day. FINDINGS: CT  HEAD Brain: Source images confirm an acute infarct involving the posterior left insular ribbon is well is the cortex over the superior left temporal lobe and left parietal lobe. No acute hemorrhage or mass lesion is present. The ganglia are intact. Calvarium and skull base: Negative. Paranasal sinuses: Clear Orbits: Within normal limits. CTA NECK Aortic arch: A 3 vessel arch configuration is present. Atherosclerotic calcifications are present without significant stenoses at the origins the great vessels. Right carotid system: The right common carotid artery is within normal limits. Minimal atherosclerotic changes are present at the carotid bifurcation. The cervical right ICA is normal. Left carotid system: The left common carotid artery is within normal limits. Atherosclerotic changes are slightly more prominent within the left carotid bifurcation. There is no significant stenosis relative to the more distal vessel. The cervical left ICA is normal. Vertebral arteries:The vertebral arteries both originate from the subclavian arteries without significant stenosis. The left vertebral artery is dominant. There is no focal stenosis or vascular injury within the cervical vertebral arteries. Skeleton: Mild endplate degenerative changes are most noted at C3-4 and C4-5. No focal lytic or blastic lesions are present. Other neck: The soft tissues of the neck are unremarkable. CTA HEAD Anterior circulation: The internal carotid arteries are within normal limits from the high cervical segments through the ICA termini. The left A1 segment is dominant. The M1 segments are intact bilaterally. There is a significant proximal stenosis in the more lateral left M2 segment with significant attenuation of more distal branch vessels no focal proximal occlusion is present. Right MCA and bilateral ACA branch vessels are within normal limits. Posterior circulation: The left vertebral artery is slightly dominant to the right. The PICA origins  are visualized and normal bilaterally. Basilar artery is within normal limits. Both posterior cerebral arteries originate the basilar tip. PCA branch vessels are intact. Venous sinuses: The dural sinuses are patent. The right transverse sinus is dominant. The straight sinus and deep cerebral veins are patent. Cortical veins are intact. Anatomic variants: None Delayed phase: Postcontrast images better demonstrate the areas of developing acute nonhemorrhagic infarction involving the posterior left insular cortex, left temporal lobe, and left parietal lobe. No pathologic enhancement is evident. IMPRESSION: 1. Developing acute/subacute nonhemorrhagic infarct of posterior left insular cortex, superior left temporal, and left parietal lobe. 2. Moderate to severe focal stenosis within the more lateral left M2 branch with significant attenuation of distal MCA branch vessels corresponding to the infarct territory. 3. No other significant proximal stenosis, aneurysm, or branch vessel occlusion. These results were called by telephone at the time of interpretation on 05/26/2015 at 12:51 Pm to Dr. Aram Beecham, who verbally acknowledged these results. Electronically Signed   By: San Morelle M.D.   On: 05/26/2015 13:10   Ct Head Wo Contrast  05/26/2015  CLINICAL DATA:  Unresponsive EXAM: CT HEAD WITHOUT CONTRAST TECHNIQUE: Contiguous axial images were  obtained from the base of the skull through the vertex without intravenous contrast. COMPARISON:  04/21/2015 FINDINGS: Bony calvarium is intact. No acute hemorrhage or space-occupying mass lesion is noted. There is some blunting of the sulcal markings in the left parietal lobe near the vertex which may represent some very early ischemia. No other focal abnormality is noted. IMPRESSION: Question early ischemia in distribution of the left middle cerebral artery. No other focal abnormality is noted. These results were called by telephone at the time of interpretation on  05/26/2015 at 9:11 am to Dr. Jola Schmidt , who verbally acknowledged these results. Electronically Signed   By: Inez Catalina M.D.   On: 05/26/2015 09:11   Ct Angio Neck W/cm &/or Wo/cm  05/26/2015  CLINICAL DATA:  Neck up stroke. Altered mental status. Unable to speak clearly. Right-sided weakness and facial droop. EXAM: CT ANGIOGRAPHY HEAD AND NECK TECHNIQUE: Multidetector CT imaging of the head and neck was performed using the standard protocol during bolus administration of intravenous contrast. Multiplanar CT image reconstructions and MIPs were obtained to evaluate the vascular anatomy. Carotid stenosis measurements (when applicable) are obtained utilizing NASCET criteria, using the distal internal carotid diameter as the denominator. CONTRAST:  64mL OMNIPAQUE IOHEXOL 350 MG/ML SOLN COMPARISON:  CT profusion study from the same day. CT head without contrast from the same day. FINDINGS: CT HEAD Brain: Source images confirm an acute infarct involving the posterior left insular ribbon is well is the cortex over the superior left temporal lobe and left parietal lobe. No acute hemorrhage or mass lesion is present. The ganglia are intact. Calvarium and skull base: Negative. Paranasal sinuses: Clear Orbits: Within normal limits. CTA NECK Aortic arch: A 3 vessel arch configuration is present. Atherosclerotic calcifications are present without significant stenoses at the origins the great vessels. Right carotid system: The right common carotid artery is within normal limits. Minimal atherosclerotic changes are present at the carotid bifurcation. The cervical right ICA is normal. Left carotid system: The left common carotid artery is within normal limits. Atherosclerotic changes are slightly more prominent within the left carotid bifurcation. There is no significant stenosis relative to the more distal vessel. The cervical left ICA is normal. Vertebral arteries:The vertebral arteries both originate from the  subclavian arteries without significant stenosis. The left vertebral artery is dominant. There is no focal stenosis or vascular injury within the cervical vertebral arteries. Skeleton: Mild endplate degenerative changes are most noted at C3-4 and C4-5. No focal lytic or blastic lesions are present. Other neck: The soft tissues of the neck are unremarkable. CTA HEAD Anterior circulation: The internal carotid arteries are within normal limits from the high cervical segments through the ICA termini. The left A1 segment is dominant. The M1 segments are intact bilaterally. There is a significant proximal stenosis in the more lateral left M2 segment with significant attenuation of more distal branch vessels no focal proximal occlusion is present. Right MCA and bilateral ACA branch vessels are within normal limits. Posterior circulation: The left vertebral artery is slightly dominant to the right. The PICA origins are visualized and normal bilaterally. Basilar artery is within normal limits. Both posterior cerebral arteries originate the basilar tip. PCA branch vessels are intact. Venous sinuses: The dural sinuses are patent. The right transverse sinus is dominant. The straight sinus and deep cerebral veins are patent. Cortical veins are intact. Anatomic variants: None Delayed phase: Postcontrast images better demonstrate the areas of developing acute nonhemorrhagic infarction involving the posterior left insular cortex, left temporal  lobe, and left parietal lobe. No pathologic enhancement is evident. IMPRESSION: 1. Developing acute/subacute nonhemorrhagic infarct of posterior left insular cortex, superior left temporal, and left parietal lobe. 2. Moderate to severe focal stenosis within the more lateral left M2 branch with significant attenuation of distal MCA branch vessels corresponding to the infarct territory. 3. No other significant proximal stenosis, aneurysm, or branch vessel occlusion. These results were called by  telephone at the time of interpretation on 05/26/2015 at 12:51 Pm to Dr. Aram Beecham, who verbally acknowledged these results. Electronically Signed   By: San Morelle M.D.   On: 05/26/2015 13:10   Ct Cerebral Perfusion W/cm  05/26/2015  ADDENDUM REPORT: 05/26/2015 13:11 ADDENDUM: These results were called by telephone at the time of interpretation on 05/26/2015 at 12:51 pm to Dr. Aram Beecham, who verbally acknowledged these results. Electronically Signed   By: San Morelle M.D.   On: 05/26/2015 13:11  05/26/2015  CLINICAL DATA:  Altered mental status.  Mental SP. EXAM: CT CEREBRAL PERFUSION WITH CONTRAST TECHNIQUE: Dynamic imaging was performed following the pulse injection of contrast. Para metric maps were subsequently calculated. CONTRAST:  96mL OMNIPAQUE IOHEXOL 350 MG/ML SOLN COMPARISON:  CT head without contrast from the same date. FINDINGS: Being time to transit is ext the and cerebral blood flow is diminished in the superior left temporal lobe and left parietal lobe within the posterior left MCA distribution. This involves posterior aspect of the left insular ribbon. There is less profound decrease in cerebral blood volume within the same territory. Less than 1/3 of the left MCA territory constitutes core infarct. Parameters are within normal limits in the right hemisphere IMPRESSION: 1. Large area of ischemia involving the posterior aspect of the left MCA territory. This involves the posterior left insular ribbon, superior left temporal lobe, and left parietal white. 2. More subtle cerebral blood volume changes with the area of core infarct constituting less than 1/3 of the left MCA territory. Electronically Signed: By: San Morelle M.D. On: 05/26/2015 12:51   Medications: I have reviewed the patient's current medications. Scheduled Meds: . antiseptic oral rinse  7 mL Mouth Rinse BID  . aspirin  300 mg Rectal Daily  . sodium chloride  10-40 mL Intracatheter Q12H  . sodium  chloride  3 mL Intravenous Q12H   Continuous Infusions: . sodium chloride 75 mL/hr at 05/26/15 1712  . heparin 1,300 Units/hr (05/27/15 0243)   PRN Meds:.ipratropium-albuterol, LORazepam, morphine injection, ondansetron **OR** ondansetron (ZOFRAN) IV, sodium chloride Assessment/Plan: Active Problems:   Stroke ([place])   Acute ischemic stroke ([place])  Acute ischemic stroke Patient is presenting with acute onset right-sided weakness, right facial droop, and expressive aphasia. She does have risk factors including obesity, hypertension, and hyperlipidemia. CTA showing acute/subacute nonhemorrhagic infarct of posterior left insular cortex, superior left temporal, and left parietal lobe. In addition, moderate to severe focal stenosis within the more lateral left M2 branch with significant attenuation of distal MCA branch vessels corresponding to the infarct territory. CT cerebral perfusion showing a large area of ischemia involving the posterior aspect of the left MCA territory. More subtle cerebral blood volume changes with the area of core infarct constituting less than 1/3 of the left MCA territory. Neurology spoke to neuroradiology and patient did not have proximal MCA occlusion amenable to endovascular revascularization. Patient has been started on Aspirin. Lipid panel showing cholesterol 139, LDL 73, and HDL 54. SLP is recommending inpatient rehab.  -Admitted to telemetry -Appreciate neurology recommendations. -Aspirin 300 mg rectal suppository daily -Ativan  1 mg every 6 hours as needed for anxiety -Start on Lipitor 40 mg daily for secondary prevention once patient is able to tolerate PO intake  -Normal saline at 75 mL per hour as patient is not able to tolerate by mouth intake -Follow-up a.m. 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 -Hold Coumadin because patient is NPO -Heparin per pharmacy  Hyperkalemia: Resolved. Potassium 5.6 on admission likely due to being on potassium supplement (patient was taking Lasix at home). K normal 3.7 today. -continue to hold Lasix and potassium supplement  COPD: Lung exam improved today; no wheezes heard.  -DuoNeb every 6 hours  Hypertension: Hold home antihypertensive (Lasix) to allow for permissive hypertension.   Hyperlipidemia: Patient is not on a statin at home. Lipid panel showing cholesterol 139, LDL 73, and HDL 54. -Start on Lipitor 40 mg daily for secondary prevention once patient is able to tolerate PO intake   Diet: Nothing by mouth as recommended by SLP  DVT prophylaxis: Heparin per pharmacy  Code: Full  Dispo: Disposition is deferred at this time, awaiting improvement of current medical problems.  Anticipated discharge in approximately 2-3 day(s).   The patient does have a current PCP (Lorayne Marek, MD) and does need an [facility] hospital follow-up appointment after discharge.  The patient does not have transportation limitations that hinder transportation to clinic appointments.  .Services Needed at time of discharge: Y = Yes, Blank = No PT:   OT:   RN:   Equipment:   Other:     LOS: 1 day   Shela Leff, MD 05/27/2015, 10:22 AM

## 2015-05-27 NOTE — Progress Notes (Signed)
ANTICOAGULATION CONSULT NOTE - Follow Up Consult  Pharmacy Consult for heparin Indication: history of DVT  Allergies  Allergen Reactions  . Kiwi Extract Anaphylaxis, Hives and Swelling    Makes her swell all over   . Aspirin Nausea Only    Can take as long as enteric coated    Patient Measurements: Height: 4\' 11"  (149.9 cm) Weight: 239 lb 6.7 oz (108.6 kg) IBW/kg (Calculated) : 43.2 Heparin Dosing Weight: 70.4kg  Vital Signs: Temp: 99.3 F (37.4 C) (12/18 1414) Temp Source: Oral (12/18 1414) BP: 159/91 mmHg (12/18 1414) Pulse Rate: 83 (12/18 1414)  Labs:  Recent Labs  05/26/15 0929 05/26/15 0934 05/26/15 1145 05/27/15 0110 05/27/15 1300  HGB 15.2* 17.7*  --  13.7  --   HCT 47.7* 52.0*  --  41.2  --   PLT 215  --   --  211  --   APTT  --   --  30  --   --   LABPROT  --   --  19.1*  --   --   INR  --   --  1.61*  --   --   HEPARINUNFRC  --   --   --  <0.10* 0.21*  CREATININE 1.06* 1.00  --  0.79  --     Estimated Creatinine Clearance: 85 mL/min (by C-G formula based on Cr of 0.79).   Medications:  Scheduled:  . antiseptic oral rinse  7 mL Mouth Rinse BID  . aspirin  300 mg Rectal Daily  . atorvastatin  40 mg Oral q1800  . sodium chloride  10-40 mL Intracatheter Q12H  . sodium chloride  3 mL Intravenous Q12H    Assessment: 59 yo female here with aphasia for r/o CVA (no TPA given). She is noted on coumadin PTA for history of DVT and pharmacy has been consulted to dose heparin. -Heparin level is up to 0.21 after increase to 1300 units/hr  Goal of Therapy:  Heparin level 0.3-0.5 units/ml Monitor platelets by anticoagulation protocol: Yes   Plan:  -Increase heparin to 1450 units/hr -Heparin level in 6 hours and daily wth CBC daily  Hildred Laser, Pharm D 05/27/2015 2:28 PM

## 2015-05-27 NOTE — H&P (Signed)
Internal Medicine Attending Admission Note Date: 05/27/2015  Patient [name] Medical record number: [id number] Date of birth: 14-Nov-1955 Age: 59 y.o. Gender: female  I saw and evaluated the patient. I reviewed the resident's note and I agree with the resident's findings and plan as documented in the resident's note.  Chief Complaint(s): Aphasia, right facial droop, right-sided weakness  History - key components related to admission:  Brooke [name] is a 59 year old woman with a history of recurrent venous thromboembolic disease, hypertension, hyperlipidemia, and colon cancer status post a hemicolectomy who presents after being found by a family member confused and aphasic while sitting on the couch. Upon evaluation in the emergency department she was found to have a moderately sized left MCA infarct. She was therefore admitted to the internal medicine teaching service for further evaluation and care.  Physical Exam - key components related to admission:  Filed Vitals:   05/27/15 0653 05/27/15 0800 05/27/15 1015 05/27/15 1140  BP: 166/97 151/84    Pulse: 89 75    Temp: 98.4 F (36.9 C) 98.8 F (37.1 C)    TempSrc: Axillary Oral    Resp: 18 18 16 15   Height:      Weight:      SpO2: 99% 100%     Gen.: Well-developed, well-nourished, obese woman sitting comfortably in bed in no acute distress. She will follow commands and say yes to simple questions but is otherwise unable to adequately communicate verbally. Neuro: Weakness in the right upper extremity. Assessing her right lower extremity strength is difficult as she is having trouble following commands to adequately perform testing. She notes decreased sensation in the right upper extremity. She is dysarthric and can only answer questions with a yes or no. The left side is intact to strength and sensation.  Lab results:  Basic Metabolic Panel:  Recent Labs  05/26/15 0929 05/26/15 0934 05/27/15 0110  NA 138 140 140  K  5.2* 5.6* 3.7  CL 105 107 106  CO2 23  --  25  GLUCOSE 95 94 92  BUN 9 14 7   CREATININE 1.06* 1.00 0.79  CALCIUM 9.0  --  8.5*   Liver Function Tests:  Recent Labs  05/26/15 0929  AST 24  ALT 13*  ALKPHOS 85  BILITOT 1.0  PROT 7.5  ALBUMIN 3.7   CBC:  Recent Labs  05/26/15 0929 05/26/15 0934 05/27/15 0110  WBC 6.5  --  6.3  NEUTROABS 3.4  --   --   HGB 15.2* 17.7* 13.7  HCT 47.7* 52.0* 41.2  MCV 100.6*  --  98.6  PLT 215  --  211   CBG:  Recent Labs  05/26/15 0859  GLUCAP 85   Fasting Lipid Panel:  Recent Labs  05/27/15 0110  CHOL 139  HDL 54  LDLCALC 73  TRIG 62  CHOLHDL 2.6   Coagulation:  Recent Labs  05/26/15 1145  INR 1.61*   Urine Drug Screen:  Unremarkable  Alcohol Level:  Recent Labs  05/26/15 0929  ETH <5   Urinalysis:  Specific gravity greater than 1.046, pH 5.5, nitrite negative, leukocyte negative.  Imaging results:  Ct Angio Head W/cm &/or Wo Cm  05/26/2015  CLINICAL DATA:  Neck up stroke. Altered mental status. Unable to speak clearly. Right-sided weakness and facial droop. EXAM: CT ANGIOGRAPHY HEAD AND NECK TECHNIQUE: Multidetector CT imaging of the head and neck was performed using the standard protocol during bolus administration of intravenous contrast. Multiplanar CT image reconstructions  and MIPs were obtained to evaluate the vascular anatomy. Carotid stenosis measurements (when applicable) are obtained utilizing NASCET criteria, using the distal internal carotid diameter as the denominator. CONTRAST:  38mL OMNIPAQUE IOHEXOL 350 MG/ML SOLN COMPARISON:  CT profusion study from the same day. CT head without contrast from the same day. FINDINGS: CT HEAD Brain: Source images confirm an acute infarct involving the posterior left insular ribbon is well is the cortex over the superior left temporal lobe and left parietal lobe. No acute hemorrhage or mass lesion is present. The ganglia are intact. Calvarium and skull base:  Negative. Paranasal sinuses: Clear Orbits: Within normal limits. CTA NECK Aortic arch: A 3 vessel arch configuration is present. Atherosclerotic calcifications are present without significant stenoses at the origins the great vessels. Right carotid system: The right common carotid artery is within normal limits. Minimal atherosclerotic changes are present at the carotid bifurcation. The cervical right ICA is normal. Left carotid system: The left common carotid artery is within normal limits. Atherosclerotic changes are slightly more prominent within the left carotid bifurcation. There is no significant stenosis relative to the more distal vessel. The cervical left ICA is normal. Vertebral arteries:The vertebral arteries both originate from the subclavian arteries without significant stenosis. The left vertebral artery is dominant. There is no focal stenosis or vascular injury within the cervical vertebral arteries. Skeleton: Mild endplate degenerative changes are most noted at C3-4 and C4-5. No focal lytic or blastic lesions are present. Other neck: The soft tissues of the neck are unremarkable. CTA HEAD Anterior circulation: The internal carotid arteries are within normal limits from the high cervical segments through the ICA termini. The left A1 segment is dominant. The M1 segments are intact bilaterally. There is a significant proximal stenosis in the more lateral left M2 segment with significant attenuation of more distal branch vessels no focal proximal occlusion is present. Right MCA and bilateral ACA branch vessels are within normal limits. Posterior circulation: The left vertebral artery is slightly dominant to the right. The PICA origins are visualized and normal bilaterally. Basilar artery is within normal limits. Both posterior cerebral arteries originate the basilar tip. PCA branch vessels are intact. Venous sinuses: The dural sinuses are patent. The right transverse sinus is dominant. The straight sinus  and deep cerebral veins are patent. Cortical veins are intact. Anatomic variants: None Delayed phase: Postcontrast images better demonstrate the areas of developing acute nonhemorrhagic infarction involving the posterior left insular cortex, left temporal lobe, and left parietal lobe. No pathologic enhancement is evident. IMPRESSION: 1. Developing acute/subacute nonhemorrhagic infarct of posterior left insular cortex, superior left temporal, and left parietal lobe. 2. Moderate to severe focal stenosis within the more lateral left M2 branch with significant attenuation of distal MCA branch vessels corresponding to the infarct territory. 3. No other significant proximal stenosis, aneurysm, or branch vessel occlusion. These results were called by telephone at the time of interpretation on 05/26/2015 at 12:51 Pm to Dr. Aram Beecham, who verbally acknowledged these results. Electronically Signed   By: San Morelle M.D.   On: 05/26/2015 13:10   Ct Head Wo Contrast  05/26/2015  CLINICAL DATA:  Unresponsive EXAM: CT HEAD WITHOUT CONTRAST TECHNIQUE: Contiguous axial images were obtained from the base of the skull through the vertex without intravenous contrast. COMPARISON:  04/21/2015 FINDINGS: Bony calvarium is intact. No acute hemorrhage or space-occupying mass lesion is noted. There is some blunting of the sulcal markings in the left parietal lobe near the vertex which may represent some very  early ischemia. No other focal abnormality is noted. IMPRESSION: Question early ischemia in distribution of the left middle cerebral artery. No other focal abnormality is noted. These results were called by telephone at the time of interpretation on 05/26/2015 at 9:11 am to Dr. Jola Schmidt , who verbally acknowledged these results. Electronically Signed   By: Inez Catalina M.D.   On: 05/26/2015 09:11   Ct Angio Neck W/cm &/or Wo/cm  05/26/2015  CLINICAL DATA:  Neck up stroke. Altered mental status. Unable to speak clearly.  Right-sided weakness and facial droop. EXAM: CT ANGIOGRAPHY HEAD AND NECK TECHNIQUE: Multidetector CT imaging of the head and neck was performed using the standard protocol during bolus administration of intravenous contrast. Multiplanar CT image reconstructions and MIPs were obtained to evaluate the vascular anatomy. Carotid stenosis measurements (when applicable) are obtained utilizing NASCET criteria, using the distal internal carotid diameter as the denominator. CONTRAST:  71mL OMNIPAQUE IOHEXOL 350 MG/ML SOLN COMPARISON:  CT profusion study from the same day. CT head without contrast from the same day. FINDINGS: CT HEAD Brain: Source images confirm an acute infarct involving the posterior left insular ribbon is well is the cortex over the superior left temporal lobe and left parietal lobe. No acute hemorrhage or mass lesion is present. The ganglia are intact. Calvarium and skull base: Negative. Paranasal sinuses: Clear Orbits: Within normal limits. CTA NECK Aortic arch: A 3 vessel arch configuration is present. Atherosclerotic calcifications are present without significant stenoses at the origins the great vessels. Right carotid system: The right common carotid artery is within normal limits. Minimal atherosclerotic changes are present at the carotid bifurcation. The cervical right ICA is normal. Left carotid system: The left common carotid artery is within normal limits. Atherosclerotic changes are slightly more prominent within the left carotid bifurcation. There is no significant stenosis relative to the more distal vessel. The cervical left ICA is normal. Vertebral arteries:The vertebral arteries both originate from the subclavian arteries without significant stenosis. The left vertebral artery is dominant. There is no focal stenosis or vascular injury within the cervical vertebral arteries. Skeleton: Mild endplate degenerative changes are most noted at C3-4 and C4-5. No focal lytic or blastic lesions are  present. Other neck: The soft tissues of the neck are unremarkable. CTA HEAD Anterior circulation: The internal carotid arteries are within normal limits from the high cervical segments through the ICA termini. The left A1 segment is dominant. The M1 segments are intact bilaterally. There is a significant proximal stenosis in the more lateral left M2 segment with significant attenuation of more distal branch vessels no focal proximal occlusion is present. Right MCA and bilateral ACA branch vessels are within normal limits. Posterior circulation: The left vertebral artery is slightly dominant to the right. The PICA origins are visualized and normal bilaterally. Basilar artery is within normal limits. Both posterior cerebral arteries originate the basilar tip. PCA branch vessels are intact. Venous sinuses: The dural sinuses are patent. The right transverse sinus is dominant. The straight sinus and deep cerebral veins are patent. Cortical veins are intact. Anatomic variants: None Delayed phase: Postcontrast images better demonstrate the areas of developing acute nonhemorrhagic infarction involving the posterior left insular cortex, left temporal lobe, and left parietal lobe. No pathologic enhancement is evident. IMPRESSION: 1. Developing acute/subacute nonhemorrhagic infarct of posterior left insular cortex, superior left temporal, and left parietal lobe. 2. Moderate to severe focal stenosis within the more lateral left M2 branch with significant attenuation of distal MCA branch vessels corresponding to  the infarct territory. 3. No other significant proximal stenosis, aneurysm, or branch vessel occlusion. These results were called by telephone at the time of interpretation on 05/26/2015 at 12:51 Pm to Dr. Aram Beecham, who verbally acknowledged these results. Electronically Signed   By: San Morelle M.D.   On: 05/26/2015 13:10   Mr Jodene Nam Head Wo Contrast  05/27/2015  CLINICAL DATA:  Left MCA territory infarct.  Abnormal speech. Facial droop. The examination had to be discontinued prior to completion due to patient unable to tolerate despite medication. EXAM: MRI HEAD WITHOUT CONTRAST MRA HEAD WITHOUT CONTRAST TECHNIQUE: Multiplanar, multiecho pulse sequences of the brain and surrounding structures were obtained without intravenous contrast. Angiographic images of the head were obtained using MRA technique without contrast. COMPARISON:  CTA head and neck, CT perfusion, and CT head 05/26/2015 FINDINGS: MRI HEAD FINDINGS The diffusion-weighted images confirm an acute infarct involving the posterior aspect of the left MCA territory. The posterior tooth there is the left insular cortex are involved. There is involvement of the posterior left frontal lobe, left parietal lobe, in the superior left temporal lobe. There is significant patient motion which could cause some artifact on the susceptibility weighted imaging. However, petechial hemorrhages suspected within the left temporal and parietal lobe infarct. MRA HEAD FINDINGS The MRA is also distorted by patient motion. Internal carotid arteries are within normal limits from the high cervical segments through the ICA termini bilaterally. The A1 and M1 segments are intact. The MCA bifurcations are intact. There is significant attenuation of MCA branch vessels bilaterally, distorted by patient motion. The left vertebral artery is the dominant vessel. The PICA origins are visualized and normal bilaterally. The basilar artery is within normal limits. Both posterior cerebral arteries originate from basilar tip. The posterior cerebral arteries are within normal limits bilaterally. IMPRESSION: 1. Confirmation of an acute infarct involving the posterior left MCA territory. 2. Probable petechial hemorrhage within the infarct. 3. Significant attenuation of proximal MCA branch vessels bilaterally without a more proximal stenosis or occlusion. 4. The study is moderately degraded by patient  motion, limiting evaluation of medium and distal small vessels. Electronically Signed   By: San Morelle M.D.   On: 05/27/2015 12:03   Mr Brain Wo Contrast  05/27/2015  CLINICAL DATA:  Left MCA territory infarct. Abnormal speech. Facial droop. The examination had to be discontinued prior to completion due to patient unable to tolerate despite medication. EXAM: MRI HEAD WITHOUT CONTRAST MRA HEAD WITHOUT CONTRAST TECHNIQUE: Multiplanar, multiecho pulse sequences of the brain and surrounding structures were obtained without intravenous contrast. Angiographic images of the head were obtained using MRA technique without contrast. COMPARISON:  CTA head and neck, CT perfusion, and CT head 05/26/2015 FINDINGS: MRI HEAD FINDINGS The diffusion-weighted images confirm an acute infarct involving the posterior aspect of the left MCA territory. The posterior tooth there is the left insular cortex are involved. There is involvement of the posterior left frontal lobe, left parietal lobe, in the superior left temporal lobe. There is significant patient motion which could cause some artifact on the susceptibility weighted imaging. However, petechial hemorrhages suspected within the left temporal and parietal lobe infarct. MRA HEAD FINDINGS The MRA is also distorted by patient motion. Internal carotid arteries are within normal limits from the high cervical segments through the ICA termini bilaterally. The A1 and M1 segments are intact. The MCA bifurcations are intact. There is significant attenuation of MCA branch vessels bilaterally, distorted by patient motion. The left vertebral artery is the dominant vessel.  The PICA origins are visualized and normal bilaterally. The basilar artery is within normal limits. Both posterior cerebral arteries originate from basilar tip. The posterior cerebral arteries are within normal limits bilaterally. IMPRESSION: 1. Confirmation of an acute infarct involving the posterior left MCA  territory. 2. Probable petechial hemorrhage within the infarct. 3. Significant attenuation of proximal MCA branch vessels bilaterally without a more proximal stenosis or occlusion. 4. The study is moderately degraded by patient motion, limiting evaluation of medium and distal small vessels. Electronically Signed   By: San Morelle M.D.   On: 05/27/2015 12:03   Ct Cerebral Perfusion W/cm  05/26/2015  ADDENDUM REPORT: 05/26/2015 13:11 ADDENDUM: These results were called by telephone at the time of interpretation on 05/26/2015 at 12:51 pm to Dr. Aram Beecham, who verbally acknowledged these results. Electronically Signed   By: San Morelle M.D.   On: 05/26/2015 13:11  05/26/2015  CLINICAL DATA:  Altered mental status.  Mental SP. EXAM: CT CEREBRAL PERFUSION WITH CONTRAST TECHNIQUE: Dynamic imaging was performed following the pulse injection of contrast. Para metric maps were subsequently calculated. CONTRAST:  35mL OMNIPAQUE IOHEXOL 350 MG/ML SOLN COMPARISON:  CT head without contrast from the same date. FINDINGS: Being time to transit is ext the and cerebral blood flow is diminished in the superior left temporal lobe and left parietal lobe within the posterior left MCA distribution. This involves posterior aspect of the left insular ribbon. There is less profound decrease in cerebral blood volume within the same territory. Less than 1/3 of the left MCA territory constitutes core infarct. Parameters are within normal limits in the right hemisphere IMPRESSION: 1. Large area of ischemia involving the posterior aspect of the left MCA territory. This involves the posterior left insular ribbon, superior left temporal lobe, and left parietal white. 2. More subtle cerebral blood volume changes with the area of core infarct constituting less than 1/3 of the left MCA territory. Electronically Signed: By: San Morelle M.D. On: 05/26/2015 12:51   Other results:  EKG: Normal sinus rhythm at 73 bpm,  left atrial enlargement, normal intervals, normal axis, no significant Q waves, no LVH by voltage, good R wave progression, no ST or T-wave changes, unchanged from the previous ECG in 04/21/2015.  Assessment & Plan by Problem:  Ms. Sublett is a 59 year old woman with a history of hypertension, hyperlipidemia, recurrent venous thromboembolic disease and prior colon cancer presents with an acute left MCA ischemic infarction with residual right-sided upper extremity weakness and expressive aphasia. This is likely secondary to small vessel atherosclerotic disease in the distal MCA, M2 branch, as seen on CTA of the brain. She is in the acute phase of this process but her aphasia is dense.  1) Acute left MCA ischemic infarction: We are continuing with the stroke evaluation. A hemoglobin A1c is pending as are the echocardiogram and carotid Dopplers. She is on heparin given her need for chronic anticoagulation and will be also treated with an aspirin suppository given her inability to swallow safely at this time. Speech therapy as well as occupational and physical therapy will be involved in the case. Speech therapy has recommended consideration for chronic inpatient rehabilitation. A consultation will be placed tomorrow to assess for her candidacy for such intervention.  2) Disposition: We will consult CIR in the morning to assess if she is a candidate for transfer for chronic inpatient therapy. I suspect she will be in the hospital for several more days as we sort through these processes and find appropriate rehabilitation  services upon discharge.

## 2015-05-27 NOTE — Progress Notes (Signed)
PT Cancellation Note  Patient Details Name: ARYKAH DEROCHER MRN: [id number] DOB: 05/23/1956   Cancelled Treatment:    Reason Eval/Treat Not Completed: Patient at procedure or test/unavailable. Will check back as schedule allows.    Rolinda Roan 05/27/2015, 10:43 AM   Rolinda Roan, PT, DPT Acute Rehabilitation Services Pager: [phone]

## 2015-05-27 NOTE — Progress Notes (Signed)
ANTICOAGULATION CONSULT NOTE - Follow Up Consult  Pharmacy Consult for heparin Indication: history of DVT  Allergies  Allergen Reactions  . Kiwi Extract Anaphylaxis, Hives and Swelling    Makes her swell all over   . Aspirin Nausea Only    Can take as long as enteric coated    Patient Measurements: Height: 4\' 11"  (149.9 cm) Weight: 239 lb 6.7 oz (108.6 kg) IBW/kg (Calculated) : 43.2 Heparin Dosing Weight: 70.4kg  Vital Signs: Temp: 98.4 F (36.9 C) (12/18 1646) Temp Source: Axillary (12/18 1646) BP: 158/90 mmHg (12/18 1646) Pulse Rate: 81 (12/18 1646)  Labs:  Recent Labs  05/26/15 0929 05/26/15 0934 05/26/15 1145 05/27/15 0110 05/27/15 1300 05/27/15 2049  HGB 15.2* 17.7*  --  13.7  --   --   HCT 47.7* 52.0*  --  41.2  --   --   PLT 215  --   --  211  --   --   APTT  --   --  30  --   --   --   LABPROT  --   --  19.1*  --   --   --   INR  --   --  1.61*  --   --   --   HEPARINUNFRC  --   --   --  <0.10* 0.21* 0.75*  CREATININE 1.06* 1.00  --  0.79  --   --     Estimated Creatinine Clearance: 85 mL/min (by C-G formula based on Cr of 0.79).   Medications:  Scheduled:  . antiseptic oral rinse  7 mL Mouth Rinse q12n4p  . aspirin  300 mg Rectal Daily  . atorvastatin  40 mg Oral q1800  . chlorhexidine  15 mL Mouth Rinse BID  . sodium chloride  10-40 mL Intracatheter Q12H  . sodium chloride  3 mL Intravenous Q12H    Assessment: 59 yo female here with aphasia for r/o CVA (no TPA given). She is noted on coumadin PTA for history of DVT and pharmacy has been consulted to dose heparin. -Heparin level is above goal (HL=0.75) after increase to 1450 units/hr  Goal of Therapy:  Heparin level 0.3-0.5 units/ml Monitor platelets by anticoagulation protocol: Yes   Plan:  -Decrease heparin to 1350 units/hr -Heparin level in 6 hours and daily wth CBC daily  Hildred Laser, Pharm D 05/27/2015 10:18 PM

## 2015-05-27 NOTE — Progress Notes (Signed)
  Echocardiogram 2D Echocardiogram has been performed.  Brooke [name] M 05/27/2015, 10:37 AM

## 2015-05-27 NOTE — Progress Notes (Signed)
Pt c/o severe h/a on L side. Denies nausea. No vomiting. Neurological assessment and NIH remain unchanged.  MD paged and made aware. Morphine dosage increased. Will continue to monitor. Bobbye Charleston, RN

## 2015-05-27 NOTE — Progress Notes (Signed)
Per MRI, patient was unable to tolerate MRI. Patient was premedicated with 1mg  Ativan prior to MRI. Patient unable to tolerate, moving arms and legs around, will not keep head still. RN administered 2mg  morphine, attempted to do MRI, patient continued to move arm, legs, and head around. RN contacted MD, MD ordered 1mg  Ativan IV, see MAR. RN administered 1mg  IV ativan per MD order, patient's respirations 16. Per MRI tech, will return patient to room if unable to obtain scan.

## 2015-05-27 NOTE — Progress Notes (Signed)
VASCULAR LAB PRELIMINARY  PRELIMINARY  PRELIMINARY  PRELIMINARY  Carotid duplex completed.    Preliminary report:  1-39% ICA plaquing.  Vertebral artery flow is antegrade.   Yajayra Feldt, RVT 05/27/2015, 3:58 PM

## 2015-05-27 NOTE — Progress Notes (Signed)
Patient groaning out, when asked if she is in pain, patient nods head yes, says yes she is in pain, points to chest with right arm. BP 158/90, temp 36.9, pulse 81, oxygen saturation is 99% on room air. Neuro assessment unchanged. Morphine 2 mg administered per MAR. MD aware. Per MD, patient has been gesturing to chest since admission. No new orders at this time. Will continue to monitor patient.

## 2015-05-27 NOTE — Progress Notes (Signed)
Pt continued to indicate chest pain that radiates to right arm, Pt is dysarthric but when asked about pain, will move right hand to mid sternum and then toward right shoulder. Medicated with 2mg  MS and paged on call for notification, no new orders received however, troponin level was drawn around 2200hrs.

## 2015-05-27 NOTE — Evaluation (Signed)
Clinical/Bedside Swallow Evaluation Patient Details  [name] MRN: [id number] Date of Birth: 1956-02-12  Today's Date: 05/27/2015 Time: SLP Start Time (ACUTE ONLY): 0820 SLP Stop Time (ACUTE ONLY): 0856 SLP Time Calculation (min) (ACUTE ONLY): 36 min  Past Medical History:  Past Medical History  Diagnosis Date  . Hypertension   . Asthma   . COPD (chronic obstructive pulmonary disease) ([place])   . Anxiety   . Cancer ([place])   . Colon cancer ([place])   . Dyslipidemia   . Migraine headache   . Chronic bronchitis   . Uterine fibroid   . Ovarian cyst   . GERD (gastroesophageal reflux disease)   . Morbid obesity ([place])   . Obstructive sleep apnea     mild  . Chronic pain   . Chronic back pain   . Arthritis   . Osteoarthritis   . Depression   . DVT (deep vein thrombosis) in pregnancy   . DVT (deep venous thrombosis) ([place])    Past Surgical History:  Past Surgical History  Procedure Laterality Date  . Knee arthroscopy      bil.  . Carpal tunnel release      rt  . Mouth surgery      teeth extraction  . Cesarean section    . Subtotal colectomy    . Colonoscopy    . Tubal ligation     HPI:  59 y.o. female brought in with acute receptive>>expressive aphasia. Woke up with stroke, with CT already showing question of early ischemia in distribution of the left middle cerebral artery.    Assessment / Plan / Recommendation Clinical Impression  Despite moderate to severe oral weakness pt manages liquids relatively well with no anterior spill. Oral transit with liquids is timely. Swallow response appears slightly delayed though laryngeal elevation and airway protection appear adequate before/during the swallow. Greatest concern is suspected oral/oropharygeal residuals leading to slight wet vocal quality and throat clearing post swallow. Would have f/u with MBS today, but pt vomited after drinking water and eating applesauce. Likely pt would not tolerate MBS today. Recommend pt  remain NPO today except for meds in puree and ice chips after oral care. SLP will f/u tomorrow for further PO trials and objective testing if needed.     Aspiration Risk  Moderate aspiration risk    Diet Recommendation NPO except meds;Ice chips PRN after oral care   Medication Administration: Crushed with puree Postural Changes: Seated upright at 90 degrees    Other  Recommendations Oral Care Recommendations: Oral care QID   Follow up Recommendations  Inpatient Rehab    Frequency and Duration min 2x/week  2 weeks       Prognosis Prognosis for Safe Diet Advancement: Good      Swallow Study   General HPI: 59 y.o. female brought in with acute receptive>>expressive aphasia. Woke up with stroke, with CT already showing question of early ischemia in distribution of the left middle cerebral artery.  Type of Study: Bedside Swallow Evaluation Previous Swallow Assessment: none Diet Prior to this Study: NPO Temperature Spikes Noted: No Respiratory Status: Room air History of Recent Intubation: No Behavior/Cognition: Lethargic/Drowsy Oral Cavity Assessment: Within Functional Limits Oral Care Completed by SLP: Other (Comment) (daughter just completed ) Oral Cavity - Dentition: Adequate natural dentition Vision: Functional for self-feeding Self-Feeding Abilities: Able to feed self Patient Positioning: Upright in bed Baseline Vocal Quality: Normal Volitional Cough: Cognitively unable to elicit (groaning, no explosive cough) Volitional Swallow: Unable to elicit  Oral/Motor/Sensory Function Overall Oral Motor/Sensory Function: Moderate impairment Facial ROM: Reduced right Facial Symmetry: Abnormal symmetry right Facial Strength: Reduced right Facial Sensation: Reduced right Lingual ROM: Reduced left;Reduced right Lingual Symmetry: Abnormal symmetry right;Abnormal symmetry left Lingual Strength: Reduced   Ice Chips Ice chips: Not tested   Thin Liquid Thin Liquid:  Impaired Presentation: Cup;Straw Pharyngeal  Phase Impairments: Suspected delayed Swallow;Throat Clearing - Delayed;Wet Vocal Quality    Nectar Thick Nectar Thick Liquid: Not tested   Honey Thick Honey Thick Liquid: Not tested   Puree Puree: Within functional limits   Solid Solid: Impaired Oral Phase Impairments: Impaired mastication Other Comments: removed      Herbie Baltimore, MA CCC-SLP [phone]  [name], Katherene Ponto 05/27/2015,9:02 AM

## 2015-05-27 NOTE — Evaluation (Signed)
Speech Language Pathology Evaluation Patient Details [name] MRN: [id number] DOB: 1955/09/10 Today's Date: 05/27/2015 Time: [id number] SLP Time Calculation (min) (ACUTE ONLY): 36 min  Problem List:  Patient Active Problem List   Diagnosis Date Noted  . Stroke ([place]) 05/26/2015  . Acute ischemic stroke ([place]) 05/26/2015  . Altered mental status 04/21/2015  . Acute tonsillitis 06/25/2014  . Acute pulmonary embolism ([place]) 02/24/2014  . Acute deep vein thrombosis (DVT) of other specified vein of right lower extremity ([place]) 02/24/2014  . Respiratory failure ([place]) 02/23/2014  . DVT (deep venous thrombosis) ([place]) 02/16/2014  . DVT femoral (deep venous thrombosis) with thrombophlebitis ([place]) 02/16/2014  . Asthma, chronic 02/08/2014  . Obstructive sleep apnea 02/08/2014  . GERD (gastroesophageal reflux disease) 09/28/2013  . Chest pain 04/19/2013  . Obesity, Class III, BMI 40-49.9 (morbid obesity) ([place]) 08/23/2012  . Unspecified constipation 08/20/2012  . Headache(784.0) 08/20/2012  . Chronic pain syndrome 08/19/2012  . Hyperglycemia 08/19/2012  . Insomnia 08/19/2012  . Hypokalemia 10/21/2011  . Tobacco abuse 10/21/2011  . Arthritis 10/21/2011  . Anxiety 10/21/2011  . Depression 10/21/2011  . Esophageal reflux 07/30/2011  . Diarrhea following gastrointestinal surgery 07/30/2011  . PVC (premature ventricular contraction) 07/10/2011  . Morbid obesity ([place])   . Hypertension   . Dyslipidemia   . 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 Hypertension   . Asthma   . COPD (chronic obstructive pulmonary disease) ([place])   . Anxiety   . Cancer ([place])   . Colon cancer ([place])   . Dyslipidemia   . Migraine headache   . Chronic bronchitis   . Uterine fibroid   . Ovarian cyst   . GERD (gastroesophageal reflux disease)   . Morbid obesity ([place])   . Obstructive  sleep apnea     mild  . Chronic pain   . Chronic back pain   . Arthritis   . Osteoarthritis   . Depression   . DVT (deep vein thrombosis) in pregnancy   . DVT (deep venous thrombosis) ([place])    Past Surgical History:  Past Surgical History  Procedure Laterality Date  . Knee arthroscopy      bil.  . Carpal tunnel release      rt  . Mouth surgery      teeth extraction  . Cesarean section    . Subtotal colectomy    . Colonoscopy    . Tubal ligation     HPI:  59 y.o. female brought in with acute receptive>>expressive aphasia. Woke up with stroke, with CT already showing question of early ischemia in distribution of the left middle cerebral artery.    Assessment / Plan / Recommendation Clinical Impression  Pt demonstrates a moderate expressive aphasia and moderate to severe dysarthria making pt very difficult to understand to unfamiliar listeners. Pt is able to repeat and overarticualte single words with cues, but struggles with perseveration and anomia with spontaneous speech. Pt able to follow single commands well, but was easily distracted during assessment impacting ability to comprehend more complex language. There may be some mild receptive aphasia as well. Pt would benefit from CIR at d/c. Acute SLP will follow to faciliate functional communication.     SLP Assessment  Patient needs continued Speech Lanaguage Pathology Services    Follow Up Recommendations  Inpatient Rehab    Frequency and Duration min 2x/week  2 weeks  SLP Evaluation Prior Functioning  Cognitive/Linguistic Baseline: Within functional limits   Cognition  Overall Cognitive Status: Impaired/Different from baseline Arousal/Alertness: Lethargic Orientation Level: Oriented to person;Oriented to place;Disoriented to time;Disoriented to situation Attention: Focused;Sustained Focused Attention: Appears intact Sustained Attention: Impaired Sustained Attention Impairment: Verbal complex;Functional  complex Memory: Appears intact Awareness: Impaired Awareness Impairment: Intellectual impairment;Emergent impairment;Anticipatory impairment Problem Solving: Appears intact Behaviors: Restless;Impulsive;Perseveration;Poor frustration tolerance Safety/Judgment: Impaired    Comprehension  Auditory Comprehension Overall Auditory Comprehension: Appears within functional limits for tasks assessed Yes/No Questions: Within Functional Limits Commands: Impaired One Step Basic Commands: 75-100% accurate Two Step Basic Commands: 0-24% accurate Multistep Basic Commands: 0-24% accurate Conversation: Simple Interfering Components: Attention;Pain EffectiveTechniques: Repetition Visual Recognition/Discrimination Discrimination: Within Function Limits Reading Comprehension Reading Status: Not tested    Expression Expression Primary Mode of Expression: Verbal Verbal Expression Overall Verbal Expression: Impaired Initiation: No impairment Automatic Speech: Name;Social Response Level of Generative/Spontaneous Verbalization: Word Repetition: No impairment Naming: Impairment Responsive: Not tested Confrontation: Impaired Convergent: 0-24% accurate Divergent: Not tested Verbal Errors: Perseveration Pragmatics: No impairment Interfering Components: Attention;Speech intelligibility Written Expression Dominant Hand: Right   Oral / Motor Oral Motor/Sensory Function Overall Oral Motor/Sensory Function: Moderate impairment Facial ROM: Reduced right Facial Symmetry: Abnormal symmetry right Facial Strength: Reduced right Facial Sensation: Reduced right Lingual ROM: Reduced left;Reduced right Lingual Symmetry: Abnormal symmetry right;Abnormal symmetry left Lingual Strength: Reduced Motor Speech Overall Motor Speech: Impaired Respiration: Within functional limits Phonation:  (harsh, decreased prosody/intonation) Resonance: Hypernasality Articulation: Impaired Level of Impairment:  Word Intelligibility: Intelligibility reduced Word: 25-49% accurate (increased intelligibilty with familiar listener) Phrase: 25-49% accurate Sentence: Not tested Conversation: Not tested Motor Planning: Witnin functional limits Motor Speech Errors: Aware;Consistent Effective Techniques: Over-articulate   Herbie Baltimore, MA CCC-SLP [phone]  Lynann Beaver 05/27/2015, 9:13 AM

## 2015-05-27 NOTE — Progress Notes (Signed)
ANTICOAGULATION CONSULT NOTE - Follow Up Consult  Pharmacy Consult for heparin Indication: h/o unprovoked DVT   Labs:  Recent Labs  05/26/15 0929 05/26/15 0934 05/26/15 1145 05/27/15 0110  HGB 15.2* 17.7*  --  13.7  HCT 47.7* 52.0*  --  41.2  PLT 215  --   --  211  APTT  --   --  30  --   LABPROT  --   --  19.1*  --   INR  --   --  1.61*  --   HEPARINUNFRC  --   --   --  <0.10*  CREATININE 1.06* 1.00  --   --      Assessment: 59yo female undetectable on heparin with initial dosing while Coumadin on hold.  Goal of Therapy:  Heparin level 0.3-0.5 units/ml   Plan:  Will increase heparin gtt by 3-4 units/kg/hr to 1300 units/hr and check level in 6hr.  Brooke [name], PharmD, BCPS  05/27/2015,2:38 AM

## 2015-05-27 NOTE — Progress Notes (Signed)
STROKE TEAM PROGRESS NOTE   HISTORY Brooke [name] is an 59 y.o. female with a past medical history significant for HTN, HLD, migraine, morbid obesity, colon cancer s/p subtotal colectomy, DVT on coumadin, COPD, and anxiety, brought in by EMS due to new onset aphasia. Family reports that she was normal around 11 pm last night and approximately at 8 am this morning was found by her granddaughter sitting in a chair " looking confused and unable to speak". EMS was summoned and upon arrival to the ED was confirmed to have receptive>expressive aphasia but no focal weakness. CT brain was personally reviewed and there is question early ischemia in distribution of the left middle cerebral artery.  INR 1.61 Presently, she is alert and awake but with global aphasia that is receptive>>expressive. Of note, patient family member reports that she was using an inhaler that has Fentanyl on it.  Date last known well: 05/25/15 Time last known well: 11 pm tPA Given: no, out of the window     SUBJECTIVE (INTERVAL HISTORY) No family members present. The patient is aphasic, somewhat lethargic, and had difficulty following commands. The patient's condition was discussed with the patient's nurse informed me that the patient had received Ativan and morphine in order to have an MRI performed. This no doubt accounts for the patient's current status.   OBJECTIVE Temp:  [97.9 F (36.6 C)-99.7 F (37.6 C)] 98.4 F (36.9 C) (12/18 0653) Pulse Rate:  [69-99] 89 (12/18 0653) Cardiac Rhythm:  [-] Normal sinus rhythm (12/17 1900) Resp:  [13-18] 18 (12/18 0653) BP: (118-166)/(42-97) 166/97 mmHg (12/18 0653) SpO2:  [97 %-100 %] 99 % (12/18 0653) Weight:  [108.6 kg (239 lb 6.7 oz)] 108.6 kg (239 lb 6.7 oz) (12/17 0915)  CBC:  Recent Labs Lab 05/26/15 0929 05/26/15 0934 05/27/15 0110  WBC 6.5  --  6.3  NEUTROABS 3.4  --   --   HGB 15.2* 17.7* 13.7  HCT 47.7* 52.0* 41.2  MCV 100.6*  --  98.6  PLT 215  --  211     Basic Metabolic Panel:  Recent Labs Lab 05/26/15 0929 05/26/15 0934 05/27/15 0110  NA 138 140 140  K 5.2* 5.6* 3.7  CL 105 107 106  CO2 23  --  25  GLUCOSE 95 94 92  BUN 9 14 7   CREATININE 1.06* 1.00 0.79  CALCIUM 9.0  --  8.5*    Lipid Panel:    Component Value Date/Time   CHOL 139 05/27/2015 0110   TRIG 62 05/27/2015 0110   HDL 54 05/27/2015 0110   CHOLHDL 2.6 05/27/2015 0110   VLDL 12 05/27/2015 0110   LDLCALC 73 05/27/2015 0110   HgbA1c:  Lab Results  Component Value Date   HGBA1C 5.5 08/20/2012   Urine Drug Screen:    Component Value Date/Time   LABOPIA NONE DETECTED 05/26/2015 1320   COCAINSCRNUR NONE DETECTED 05/26/2015 1320   LABBENZ NONE DETECTED 05/26/2015 1320   AMPHETMU NONE DETECTED 05/26/2015 1320   THCU NONE DETECTED 05/26/2015 1320   LABBARB NONE DETECTED 05/26/2015 1320      IMAGING  Ct Angio Head and Neck W/cm &/or Wo Cm 05/26/2015   1. Developing acute/subacute nonhemorrhagic infarct of posterior left insular cortex, superior left temporal, and left parietal lobe.  2. Moderate to severe focal stenosis within the more lateral left M2 branch with significant attenuation of distal MCA branch vessels corresponding to the infarct territory.  3. No other significant proximal stenosis, aneurysm, or branch  vessel occlusion.     Ct Head Wo Contrast 05/26/2015   Question early ischemia in distribution of the left middle cerebral artery. No other focal abnormality is noted.      Ct Cerebral Perfusion W/cm 05/26/2015   1. Large area of ischemia involving the posterior aspect of the left MCA territory. This involves the posterior left insular ribbon, superior left temporal lobe, and left parietal white.  2. More subtle cerebral blood volume changes with the area of core infarct constituting less than 1/3 of the left MCA territory.    MRI / MRA head 05/27/2015 1. Confirmation of an acute infarct involving the posterior left MCA  territory. 2. Probable petechial hemorrhage within the infarct. 3. Significant attenuation of proximal MCA branch vessels bilaterally without a more proximal stenosis or occlusion. 4. The study is moderately degraded by patient motion, limiting evaluation of medium and distal small vessels.   PHYSICAL EXAM (patient had received Ativan and morphine for MRI) Mental Status: The patient is aphasic. She was not able to follow commands at this time.  Cranial Nerves: II: Discs not visualized; Visual fields could not be tested. III,IV, VI: ptosis not present, question of mild left gaze preference V,VII: The patient is not following commands adequately tested for facial asymmetry. VIII: hearing appears normal. She does respond to voice IX,X: uvula could not be tested XI: bilateral shoulder shrug unable to test XII: Unable to follow commands to extend tongue Motor: The patient is moving all 4 extremities spontaneously Sensory: reacts to noxious stimuli Cerebellar: Unable to test  Gait:  Not attempted at this time   ASSESSMENT/PLAN Brooke [name] is a 59 y.o. female with history of hypertension, asthma, COPD, dyslipidemia, colon cancer, migraine headaches, obesity, obstructive sleep apnea, and DVT history  presenting with aphasia. She did not receive IV t-PA due to late presentation.  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  Resultant  aphasia  MRI  large left middle cerebral artery territory infarct.  MRA  Moderate to severe focal stenosis within the more lateral left M2 branch  Carotid Doppler  pending  2D Echo EF 50-55%. No cardiac source of emboli identified.  LDL 73  HgbA1c pending  VTE prophylaxis - IV heparin  Diet NPO time specified  warfarin daily prior to admission, now on aspirin 300 mg suppository daily and heparin IV  Patient counseled to be compliant with her antithrombotic medications  Ongoing aggressive stroke risk factor  management  Therapy recommendations:  Pending  Disposition: Pending  Hypertension  Stable  Permissive hypertension (OK if < 220/120) but gradually normalize in 5-7 days  Hyperlipidemia  Home meds:  No lipid lowering medications prior to admission.  LDL 73, goal < 70  Now on Lipitor 40 mg daily  Continue statin at discharge    Other Stroke Risk Factors  Cigarette smoker, Will be advised to stop smoking  Obesity, Body mass index is 48.33 kg/(m^2).   Family hx stroke (father)  Migraines  Obstructive sleep apnea  Other Active Problems    Hospital day # [place] PA-C [facility] Pager [phone] 05/27/2015, 2:46 PM   I have personally examined this patient, reviewed notes, independently viewed imaging studies, participated in medical decision making and plan of care. I have made any additions or clarifications directly to the above note. Agree with note above.   On heprain gtt Would consider starting NOAC. Leotis Pain   To contact Stroke Continuity provider, please refer to  [email or web address] After hours, contact General Neurology

## 2015-05-27 NOTE — Progress Notes (Signed)
Utilization Review Completed.Brooke [name] T12/18/2016  

## 2015-05-28 ENCOUNTER — Inpatient Hospital Stay (HOSPITAL_COMMUNITY): Payer: Medicare HMO

## 2015-05-28 ENCOUNTER — Encounter (HOSPITAL_COMMUNITY): Payer: Self-pay | Admitting: Physician Assistant

## 2015-05-28 DIAGNOSIS — Z86711 Personal history of pulmonary embolism: Secondary | ICD-10-CM

## 2015-05-28 DIAGNOSIS — E875 Hyperkalemia: Secondary | ICD-10-CM

## 2015-05-28 DIAGNOSIS — I10 Essential (primary) hypertension: Secondary | ICD-10-CM

## 2015-05-28 DIAGNOSIS — Z86718 Personal history of other venous thrombosis and embolism: Secondary | ICD-10-CM

## 2015-05-28 DIAGNOSIS — R7989 Other specified abnormal findings of blood chemistry: Secondary | ICD-10-CM

## 2015-05-28 DIAGNOSIS — R778 Other specified abnormalities of plasma proteins: Secondary | ICD-10-CM

## 2015-05-28 DIAGNOSIS — R4701 Aphasia: Secondary | ICD-10-CM | POA: Insufficient documentation

## 2015-05-28 LAB — TROPONIN I
TROPONIN I: 0.24 ng/mL — AB (ref <0.031)
Troponin I: 0.12 ng/mL — ABNORMAL HIGH (ref <0.031)
Troponin I: 0.29 ng/mL — ABNORMAL HIGH (ref <0.031)

## 2015-05-28 LAB — CBC
HEMATOCRIT: 40.7 % (ref 36.0–46.0)
HEMOGLOBIN: 13.2 g/dL (ref 12.0–15.0)
MCH: 31.8 pg (ref 26.0–34.0)
MCHC: 32.4 g/dL (ref 30.0–36.0)
MCV: 98.1 fL (ref 78.0–100.0)
Platelets: 191 10*3/uL (ref 150–400)
RBC: 4.15 MIL/uL (ref 3.87–5.11)
RDW: 13.8 % (ref 11.5–15.5)
WBC: 6.3 10*3/uL (ref 4.0–10.5)

## 2015-05-28 LAB — BASIC METABOLIC PANEL
ANION GAP: 9 (ref 5–15)
BUN: <5 mg/dL — ABNORMAL LOW (ref 6–20)
CALCIUM: 8.4 mg/dL — AB (ref 8.9–10.3)
CO2: 25 mmol/L (ref 22–32)
Chloride: 103 mmol/L (ref 101–111)
Creatinine, Ser: 0.64 mg/dL (ref 0.44–1.00)
GLUCOSE: 98 mg/dL (ref 65–99)
POTASSIUM: 3.4 mmol/L — AB (ref 3.5–5.1)
SODIUM: 137 mmol/L (ref 135–145)

## 2015-05-28 LAB — HEMOGLOBIN A1C
HEMOGLOBIN A1C: 5.6 % (ref 4.8–5.6)
Mean Plasma Glucose: 114 mg/dL

## 2015-05-28 LAB — HEPARIN LEVEL (UNFRACTIONATED): HEPARIN UNFRACTIONATED: 0.95 [IU]/mL — AB (ref 0.30–0.70)

## 2015-05-28 LAB — MAGNESIUM: Magnesium: 2 mg/dL (ref 1.7–2.4)

## 2015-05-28 MED ORDER — ACETAMINOPHEN 650 MG RE SUPP: 650.0000 mg | Freq: Three times a day (TID) | RECTAL | Status: DC | PRN

## 2015-05-28 MED ORDER — KCL IN DEXTROSE-NACL 20-5-0.9 MEQ/L-%-% IV SOLN
INTRAVENOUS | Status: DC
  Administered 2015-05-28: 18:00:00 via INTRAVENOUS
  Administered 2015-05-29: 75 mL/h via INTRAVENOUS
  Filled 2015-05-28 (×4): qty 1000

## 2015-05-28 MED ORDER — PANTOPRAZOLE SODIUM 40 MG IV SOLR
40.0000 mg | Freq: Every day | INTRAVENOUS | Status: DC
  Administered 2015-05-28 – 2015-05-29 (×2): 40 mg via INTRAVENOUS
  Filled 2015-05-28: qty 40

## 2015-05-28 MED ORDER — HEPARIN (PORCINE) IN NACL 100-0.45 UNIT/ML-% IJ SOLN
1000.0000 [IU]/h | INTRAMUSCULAR | Status: DC
  Filled 2015-05-28: qty 250

## 2015-05-28 MED ORDER — ACETAMINOPHEN 650 MG RE SUPP
650.0000 mg | Freq: Once | RECTAL | Status: AC
  Administered 2015-05-28: 650 mg via RECTAL
  Filled 2015-05-28: qty 1

## 2015-05-28 MED ORDER — SODIUM CHLORIDE 0.9 % IJ SOLN
10.0000 mL | INTRAMUSCULAR | Status: DC | PRN
Start: 2015-05-28 — End: 2015-05-30
  Administered 2015-05-29 (×3): 10 mL
  Filled 2015-05-28 (×3): qty 40

## 2015-05-28 NOTE — Progress Notes (Signed)
ANTICOAGULATION CONSULT NOTE - Follow Up Consult  Pharmacy Consult for heparin Indication: h/o DVT w/ possible new CVA   Labs:  Recent Labs  05/26/15 0929 05/26/15 0934 05/26/15 1145  05/27/15 0110 05/27/15 1300 05/27/15 2049 05/27/15 2159 05/28/15 0603  HGB 15.2* 17.7*  --   --  13.7  --   --   --  13.2  HCT 47.7* 52.0*  --   --  41.2  --   --   --  40.7  PLT 215  --   --   --  211  --   --   --  191  APTT  --   --  30  --   --   --   --   --   --   LABPROT  --   --  19.1*  --   --   --   --   --   --   INR  --   --  1.61*  --   --   --   --   --   --   HEPARINUNFRC  --   --   --   < > <0.10* 0.21* 0.75*  --  0.95*  CREATININE 1.06* 1.00  --   --  0.79  --   --   --   --   TROPONINI  --   --   --   --   --   --   --  <0.03  --   < > = values in this interval not displayed.   Assessment: 59yo female now w/ higher heparin level despite decreased rate.  Goal of Therapy:  Heparin level 0.3-0.5 units/ml   Plan:  Will hold heparin gtt x1hr then resume at lower rate of 1000 units/hr and check level in 6hr.  Brooke [name], PharmD, BCPS  05/28/2015,6:58 AM

## 2015-05-28 NOTE — Progress Notes (Addendum)
Subjective: Patient was seen and examined at bedside this morning. She has expressive aphasia but is able to understand what is said to her. She is following verbal commands. Patient seems to be complaining of chest pain because when asked about pain she nods and moves her left hand over her face, sternum, R shoulder, and abdomen. Unfortunately, she recently suffered a CVA causing significant expressive aphasia so it is difficult to illicit a good history of the chest pain. She can answer yes/no questions.   Objective: Vital signs in last 24 hours: Filed Vitals:   05/27/15 2255 05/28/15 0207 05/28/15 0542 05/28/15 0919  BP: 159/81 177/86 144/77 149/72  Pulse: 75 89 91 68  Temp: 98 F (36.7 C) 99 F (37.2 C) 98.4 F (36.9 C) 98 F (36.7 C)  TempSrc: Axillary Axillary Oral Oral  Resp: 16 16 16 15   Height:      Weight:      SpO2: 100% 100% 100% 100%   Weight change:   Intake/Output Summary (Last 24 hours) at 05/28/15 1040 Last data filed at 05/28/15 0242  Gross per 24 hour  Intake      0 ml  Output    475 ml  Net   -475 ml     Physical Exam  Constitutional: Obese female. She appears well-developed and well-nourished. No acute distress.  Eyes: EOM are normal.  Cardiovascular: Normal rate, regular rhythm and intact distal pulses.Chest wall is tender to palpation.  Pulmonary/Chest: Effort normal and breath sounds normal. No respiratory distress. She has no wheezes, rales, or rhonchi.  Abdominal: Soft. Bowel sounds are normal. She exhibits no distension. There is no tenderness.  Musculoskeletal: She exhibits no edema.  Neurological: She is alert.  Expressive aphasia. Patient was following commands. Right-sided facial droop noted. Strength 3 /5 and no sensation in right upper extremity. Strength and sensation intact in the left upper extremity. It was difficult to assess strength and sensation in the patient's lower extremities.  Skin: Skin is warm and dry.   Lab  Results: Basic Metabolic Panel:  Recent Labs Lab 05/27/15 0110 05/28/15 0603  NA 140 137  K 3.7 3.4*  CL 106 103  CO2 25 25  GLUCOSE 92 98  BUN 7 <5*  CREATININE 0.79 0.64  CALCIUM 8.5* 8.4*   Liver Function Tests:  Recent Labs Lab 05/26/15 0929  AST 24  ALT 13*  ALKPHOS 85  BILITOT 1.0  PROT 7.5  ALBUMIN 3.7   CBC:  Recent Labs Lab 05/26/15 0929  05/27/15 0110 05/28/15 0603  WBC 6.5  --  6.3 6.3  NEUTROABS 3.4  --   --   --   HGB 15.2*  < > 13.7 13.2  HCT 47.7*  < > 41.2 40.7  MCV 100.6*  --  98.6 98.1  PLT 215  --  211 191  < > = values in this interval not displayed. CBG:  Recent Labs Lab 05/26/15 0859  GLUCAP 85   Fasting Lipid Panel:  Recent Labs Lab 05/27/15 0110  CHOL 139  HDL 54  LDLCALC 73  TRIG 62  CHOLHDL 2.6   Coagulation:  Recent Labs Lab 05/26/15 1145  LABPROT 19.1*  INR 1.61*   Anemia Panel: No results for input(s): VITAMINB12, FOLATE, FERRITIN, TIBC, IRON, RETICCTPCT in the last 168 hours. Urine Drug Screen: Drugs of Abuse     Component Value Date/Time   LABOPIA NONE DETECTED 05/26/2015 1320   COCAINSCRNUR NONE DETECTED 05/26/2015 1320   LABBENZ NONE  DETECTED 05/26/2015 1320   AMPHETMU NONE DETECTED 05/26/2015 1320   THCU NONE DETECTED 05/26/2015 1320   LABBARB NONE DETECTED 05/26/2015 1320    Alcohol Level:  Recent Labs Lab 05/26/15 0929  ETH <5   Urinalysis:  Recent Labs Lab 05/26/15 1319  COLORURINE YELLOW  LABSPEC >1.046*  PHURINE 5.5  GLUCOSEU NEGATIVE  HGBUR NEGATIVE  BILIRUBINUR NEGATIVE  KETONESUR NEGATIVE  PROTEINUR NEGATIVE  NITRITE NEGATIVE  LEUKOCYTESUR NEGATIVE   Micro Results: No results found for this or any previous visit (from the past 240 hour(s)). Studies/Results: Ct Angio Head W/cm &/or Wo Cm  05/26/2015  CLINICAL DATA:  Neck up stroke. Altered mental status. Unable to speak clearly. Right-sided weakness and facial droop. EXAM: CT ANGIOGRAPHY HEAD AND NECK TECHNIQUE:  Multidetector CT imaging of the head and neck was performed using the standard protocol during bolus administration of intravenous contrast. Multiplanar CT image reconstructions and MIPs were obtained to evaluate the vascular anatomy. Carotid stenosis measurements (when applicable) are obtained utilizing NASCET criteria, using the distal internal carotid diameter as the denominator. CONTRAST:  29mL OMNIPAQUE IOHEXOL 350 MG/ML SOLN COMPARISON:  CT profusion study from the same day. CT head without contrast from the same day. FINDINGS: CT HEAD Brain: Source images confirm an acute infarct involving the posterior left insular ribbon is well is the cortex over the superior left temporal lobe and left parietal lobe. No acute hemorrhage or mass lesion is present. The ganglia are intact. Calvarium and skull base: Negative. Paranasal sinuses: Clear Orbits: Within normal limits. CTA NECK Aortic arch: A 3 vessel arch configuration is present. Atherosclerotic calcifications are present without significant stenoses at the origins the great vessels. Right carotid system: The right common carotid artery is within normal limits. Minimal atherosclerotic changes are present at the carotid bifurcation. The cervical right ICA is normal. Left carotid system: The left common carotid artery is within normal limits. Atherosclerotic changes are slightly more prominent within the left carotid bifurcation. There is no significant stenosis relative to the more distal vessel. The cervical left ICA is normal. Vertebral arteries:The vertebral arteries both originate from the subclavian arteries without significant stenosis. The left vertebral artery is dominant. There is no focal stenosis or vascular injury within the cervical vertebral arteries. Skeleton: Mild endplate degenerative changes are most noted at C3-4 and C4-5. No focal lytic or blastic lesions are present. Other neck: The soft tissues of the neck are unremarkable. CTA HEAD Anterior  circulation: The internal carotid arteries are within normal limits from the high cervical segments through the ICA termini. The left A1 segment is dominant. The M1 segments are intact bilaterally. There is a significant proximal stenosis in the more lateral left M2 segment with significant attenuation of more distal branch vessels no focal proximal occlusion is present. Right MCA and bilateral ACA branch vessels are within normal limits. Posterior circulation: The left vertebral artery is slightly dominant to the right. The PICA origins are visualized and normal bilaterally. Basilar artery is within normal limits. Both posterior cerebral arteries originate the basilar tip. PCA branch vessels are intact. Venous sinuses: The dural sinuses are patent. The right transverse sinus is dominant. The straight sinus and deep cerebral veins are patent. Cortical veins are intact. Anatomic variants: None Delayed phase: Postcontrast images better demonstrate the areas of developing acute nonhemorrhagic infarction involving the posterior left insular cortex, left temporal lobe, and left parietal lobe. No pathologic enhancement is evident. IMPRESSION: 1. Developing acute/subacute nonhemorrhagic infarct of posterior left insular cortex, superior  left temporal, and left parietal lobe. 2. Moderate to severe focal stenosis within the more lateral left M2 branch with significant attenuation of distal MCA branch vessels corresponding to the infarct territory. 3. No other significant proximal stenosis, aneurysm, or branch vessel occlusion. These results were called by telephone at the time of interpretation on 05/26/2015 at 12:51 Pm to Dr. Aram Beecham, who verbally acknowledged these results. Electronically Signed   By: San Morelle M.D.   On: 05/26/2015 13:10   Ct Angio Neck W/cm &/or Wo/cm  05/26/2015  CLINICAL DATA:  Neck up stroke. Altered mental status. Unable to speak clearly. Right-sided weakness and facial droop. EXAM: CT  ANGIOGRAPHY HEAD AND NECK TECHNIQUE: Multidetector CT imaging of the head and neck was performed using the standard protocol during bolus administration of intravenous contrast. Multiplanar CT image reconstructions and MIPs were obtained to evaluate the vascular anatomy. Carotid stenosis measurements (when applicable) are obtained utilizing NASCET criteria, using the distal internal carotid diameter as the denominator. CONTRAST:  36mL OMNIPAQUE IOHEXOL 350 MG/ML SOLN COMPARISON:  CT profusion study from the same day. CT head without contrast from the same day. FINDINGS: CT HEAD Brain: Source images confirm an acute infarct involving the posterior left insular ribbon is well is the cortex over the superior left temporal lobe and left parietal lobe. No acute hemorrhage or mass lesion is present. The ganglia are intact. Calvarium and skull base: Negative. Paranasal sinuses: Clear Orbits: Within normal limits. CTA NECK Aortic arch: A 3 vessel arch configuration is present. Atherosclerotic calcifications are present without significant stenoses at the origins the great vessels. Right carotid system: The right common carotid artery is within normal limits. Minimal atherosclerotic changes are present at the carotid bifurcation. The cervical right ICA is normal. Left carotid system: The left common carotid artery is within normal limits. Atherosclerotic changes are slightly more prominent within the left carotid bifurcation. There is no significant stenosis relative to the more distal vessel. The cervical left ICA is normal. Vertebral arteries:The vertebral arteries both originate from the subclavian arteries without significant stenosis. The left vertebral artery is dominant. There is no focal stenosis or vascular injury within the cervical vertebral arteries. Skeleton: Mild endplate degenerative changes are most noted at C3-4 and C4-5. No focal lytic or blastic lesions are present. Other neck: The soft tissues of the neck  are unremarkable. CTA HEAD Anterior circulation: The internal carotid arteries are within normal limits from the high cervical segments through the ICA termini. The left A1 segment is dominant. The M1 segments are intact bilaterally. There is a significant proximal stenosis in the more lateral left M2 segment with significant attenuation of more distal branch vessels no focal proximal occlusion is present. Right MCA and bilateral ACA branch vessels are within normal limits. Posterior circulation: The left vertebral artery is slightly dominant to the right. The PICA origins are visualized and normal bilaterally. Basilar artery is within normal limits. Both posterior cerebral arteries originate the basilar tip. PCA branch vessels are intact. Venous sinuses: The dural sinuses are patent. The right transverse sinus is dominant. The straight sinus and deep cerebral veins are patent. Cortical veins are intact. Anatomic variants: None Delayed phase: Postcontrast images better demonstrate the areas of developing acute nonhemorrhagic infarction involving the posterior left insular cortex, left temporal lobe, and left parietal lobe. No pathologic enhancement is evident. IMPRESSION: 1. Developing acute/subacute nonhemorrhagic infarct of posterior left insular cortex, superior left temporal, and left parietal lobe. 2. Moderate to severe focal stenosis within the  more lateral left M2 branch with significant attenuation of distal MCA branch vessels corresponding to the infarct territory. 3. No other significant proximal stenosis, aneurysm, or branch vessel occlusion. These results were called by telephone at the time of interpretation on 05/26/2015 at 12:51 Pm to Dr. Aram Beecham, who verbally acknowledged these results. Electronically Signed   By: San Morelle M.D.   On: 05/26/2015 13:10   Mr Jodene Nam Head Wo Contrast  05/27/2015  CLINICAL DATA:  Left MCA territory infarct. Abnormal speech. Facial droop. The examination had to  be discontinued prior to completion due to patient unable to tolerate despite medication. EXAM: MRI HEAD WITHOUT CONTRAST MRA HEAD WITHOUT CONTRAST TECHNIQUE: Multiplanar, multiecho pulse sequences of the brain and surrounding structures were obtained without intravenous contrast. Angiographic images of the head were obtained using MRA technique without contrast. COMPARISON:  CTA head and neck, CT perfusion, and CT head 05/26/2015 FINDINGS: MRI HEAD FINDINGS The diffusion-weighted images confirm an acute infarct involving the posterior aspect of the left MCA territory. The posterior tooth there is the left insular cortex are involved. There is involvement of the posterior left frontal lobe, left parietal lobe, in the superior left temporal lobe. There is significant patient motion which could cause some artifact on the susceptibility weighted imaging. However, petechial hemorrhages suspected within the left temporal and parietal lobe infarct. MRA HEAD FINDINGS The MRA is also distorted by patient motion. Internal carotid arteries are within normal limits from the high cervical segments through the ICA termini bilaterally. The A1 and M1 segments are intact. The MCA bifurcations are intact. There is significant attenuation of MCA branch vessels bilaterally, distorted by patient motion. The left vertebral artery is the dominant vessel. The PICA origins are visualized and normal bilaterally. The basilar artery is within normal limits. Both posterior cerebral arteries originate from basilar tip. The posterior cerebral arteries are within normal limits bilaterally. IMPRESSION: 1. Confirmation of an acute infarct involving the posterior left MCA territory. 2. Probable petechial hemorrhage within the infarct. 3. Significant attenuation of proximal MCA branch vessels bilaterally without a more proximal stenosis or occlusion. 4. The study is moderately degraded by patient motion, limiting evaluation of medium and distal small  vessels. Electronically Signed   By: San Morelle M.D.   On: 05/27/2015 12:03   Mr Brain Wo Contrast  05/27/2015  CLINICAL DATA:  Left MCA territory infarct. Abnormal speech. Facial droop. The examination had to be discontinued prior to completion due to patient unable to tolerate despite medication. EXAM: MRI HEAD WITHOUT CONTRAST MRA HEAD WITHOUT CONTRAST TECHNIQUE: Multiplanar, multiecho pulse sequences of the brain and surrounding structures were obtained without intravenous contrast. Angiographic images of the head were obtained using MRA technique without contrast. COMPARISON:  CTA head and neck, CT perfusion, and CT head 05/26/2015 FINDINGS: MRI HEAD FINDINGS The diffusion-weighted images confirm an acute infarct involving the posterior aspect of the left MCA territory. The posterior tooth there is the left insular cortex are involved. There is involvement of the posterior left frontal lobe, left parietal lobe, in the superior left temporal lobe. There is significant patient motion which could cause some artifact on the susceptibility weighted imaging. However, petechial hemorrhages suspected within the left temporal and parietal lobe infarct. MRA HEAD FINDINGS The MRA is also distorted by patient motion. Internal carotid arteries are within normal limits from the high cervical segments through the ICA termini bilaterally. The A1 and M1 segments are intact. The MCA bifurcations are intact. There is significant attenuation of MCA  branch vessels bilaterally, distorted by patient motion. The left vertebral artery is the dominant vessel. The PICA origins are visualized and normal bilaterally. The basilar artery is within normal limits. Both posterior cerebral arteries originate from basilar tip. The posterior cerebral arteries are within normal limits bilaterally. IMPRESSION: 1. Confirmation of an acute infarct involving the posterior left MCA territory. 2. Probable petechial hemorrhage within the  infarct. 3. Significant attenuation of proximal MCA branch vessels bilaterally without a more proximal stenosis or occlusion. 4. The study is moderately degraded by patient motion, limiting evaluation of medium and distal small vessels. Electronically Signed   By: San Morelle M.D.   On: 05/27/2015 12:03   Ct Cerebral Perfusion W/cm  05/26/2015  ADDENDUM REPORT: 05/26/2015 13:11 ADDENDUM: These results were called by telephone at the time of interpretation on 05/26/2015 at 12:51 pm to Dr. Aram Beecham, who verbally acknowledged these results. Electronically Signed   By: San Morelle M.D.   On: 05/26/2015 13:11  05/26/2015  CLINICAL DATA:  Altered mental status.  Mental SP. EXAM: CT CEREBRAL PERFUSION WITH CONTRAST TECHNIQUE: Dynamic imaging was performed following the pulse injection of contrast. Para metric maps were subsequently calculated. CONTRAST:  54mL OMNIPAQUE IOHEXOL 350 MG/ML SOLN COMPARISON:  CT head without contrast from the same date. FINDINGS: Being time to transit is ext the and cerebral blood flow is diminished in the superior left temporal lobe and left parietal lobe within the posterior left MCA distribution. This involves posterior aspect of the left insular ribbon. There is less profound decrease in cerebral blood volume within the same territory. Less than 1/3 of the left MCA territory constitutes core infarct. Parameters are within normal limits in the right hemisphere IMPRESSION: 1. Large area of ischemia involving the posterior aspect of the left MCA territory. This involves the posterior left insular ribbon, superior left temporal lobe, and left parietal white. 2. More subtle cerebral blood volume changes with the area of core infarct constituting less than 1/3 of the left MCA territory. Electronically Signed: By: San Morelle M.D. On: 05/26/2015 12:51   Medications: I have reviewed the patient's current medications. Scheduled Meds: . antiseptic oral rinse  7 mL  Mouth Rinse q12n4p  . aspirin  300 mg Rectal Daily  . atorvastatin  40 mg Oral q1800  . chlorhexidine  15 mL Mouth Rinse BID  . pantoprazole (PROTONIX) IV  40 mg Intravenous Daily  . sodium chloride  10-40 mL Intracatheter Q12H  . sodium chloride  3 mL Intravenous Q12H   Continuous Infusions: . sodium chloride 75 mL/hr (05/28/15 0509)  . dextrose 5 % and 0.9 % NaCl with KCl 20 mEq/L     PRN Meds:.acetaminophen, ipratropium-albuterol, LORazepam, morphine injection, ondansetron **OR** ondansetron (ZOFRAN) IV, sodium chloride Assessment/Plan: Active Problems:   Stroke ([place])   Acute ischemic stroke ([facility])   Aphasia   Weakness  Acute ischemic stroke Patient is presenting with acute onset right-sided weakness, right facial droop, and expressive aphasia. She does have risk factors including obesity, hypertension, and hyperlipidemia. CTA showing acute/subacute nonhemorrhagic infarct of posterior left insular cortex, superior left temporal, and left parietal lobe. In addition, moderate to severe focal stenosis within the more lateral left M2 branch with significant attenuation of distal MCA branch vessels corresponding to the infarct territory. CT cerebral perfusion showing a large area of ischemia involving the posterior aspect of the left MCA territory. More subtle cerebral blood volume changes with the area of core infarct constituting less than 1/3 of the left MCA  territory. Echo showing normal LV systolic function with an ejection fraction of 50-55% and no cardiac source of emboli. Neurology spoke to neuroradiology and patient did not have proximal MCA occlusion amenable to endovascular revascularization. Patient has been started on Aspirin. Lipid panel showing cholesterol 139, LDL 73, and HDL 54. A1c is 5.6. I spoke to Dr. Leonie Man from Neurology and he is recommending ordering an TEE to rule out PFO since patient had an embolic stroke. However, a loop recorder to look for A-fib will be less useful  because patient is already on anticoagulation with Coumadin.  -Monitor on telemetry -Appreciate neurology recommendations. -Spoke to cardiology requesting TEE -Aspirin 300 mg rectal suppository daily -Ativan 1 mg every 6 hours as needed for anxiety -Start on Lipitor 40 mg daily for secondary prevention once patient is able to tolerate PO intake  -Normal saline at 75 mL per hour as patient is not able to tolerate by mouth intake -Pending carotid Dopplers -Neuro checks every 2 hours -PT/OT eval and treat -SLP is recommending inpatient rehab. CIR has been consulted.   Chest pain Patient seems to be complaining of chest pain. Unfortunately, she recently suffered a CVA causing significant expressive aphasia so it is difficult to illicit a good history of the chest pain. Patient has no prior history of CAD or cath that can be seen in her records, but does have significant risk factors, Heart Score 5 putting her at moderate risk of ACS.  Echo shows EF of 50-55%. EKG yesterday was normal without ischemic changes. First troponin normal. Second troponin elevated at 0.12. There is no good explanation of her elevated troponin- no obvious causes of demand ischemia. Elevated troponin could likely be due to necrotic tissue in brain from recent ischemic stroke. However, knowing patient has significant risk factors and is moderate risk for major cardiovascular events based on heart score, it is a good idea to rule out NSTEMI. PE is less likely as patient is not tachycardic and satting well on room air and has been on heparin for history of DVT. Chest pain could be possibly due to GERD. Musculoskeletal pain is also on the differential because patient did grimace when her chest wall was palpated. 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  -Hold Coumadin because patient is NPO -Heparin per pharmacy  Hyperkalemia: Resolved. Potassium 5.6 on admission likely due to being on potassium supplement (patient was taking Lasix at home). K 3.4 today. -continue to hold Lasix  -Dextrose 5%-NS-KCl 69meq @ 77 cc/hr   COPD: Lung exam improved; no wheezes heard.  -DuoNeb every 6 hours  Hypertension: Hold home antihypertensive (Lasix) to allow for permissive hypertension.   Hyperlipidemia: Patient is not on a statin at home. Lipid panel showing cholesterol 139, LDL 73, and HDL 54. -Start on Lipitor 40 mg daily for secondary prevention once patient is able to tolerate PO intake   Diet: Nothing by mouth as recommended by SLP  DVT prophylaxis: Heparin per pharmacy  Code: Full  Dispo: Disposition is deferred at this time, awaiting improvement of current medical problems.  Anticipated discharge in approximately 2-3 day(s).   The patient does have a current PCP Child psychotherapist  Advani, MD) and does need an [facility] follow-up appointment after discharge.  The patient does not have transportation limitations that hinder transportation to clinic appointments.  .Services Needed at time of discharge: Y = Yes, Blank = No PT:   OT:   RN:   Equipment:   Other:     LOS: 2 days   Shela Leff, MD 05/28/2015, 10:40 AM

## 2015-05-28 NOTE — Progress Notes (Signed)
PT Cancellation Note  Patient Details Name: GARNETTE MOLLISON MRN: [id number] DOB: 08-Jul-1955   Cancelled Treatment:    Reason Eval/Treat Not Completed: Patient not medically ready. Patient on strict bedrest (most recent activity order) and with elevated Troponin.    Ardis Lawley 05/28/2015, 8:07 AM  Pager [phone]

## 2015-05-28 NOTE — Progress Notes (Signed)
STROKE TEAM PROGRESS NOTE   HISTORY Brooke [name] is an 59 y.o. female with a past medical history significant for HTN, HLD, migraine, morbid obesity, colon cancer s/p subtotal colectomy, DVT on coumadin, COPD, and anxiety, brought in by EMS due to new onset aphasia. Family reports that she was normal around 11 pm last night and approximately at 8 am this morning was found by her granddaughter sitting in a chair " looking confused and unable to speak". EMS was summoned and upon arrival to the ED was confirmed to have receptive>expressive aphasia but no focal weakness. CT brain was personally reviewed and there is question early ischemia in distribution of the left middle cerebral artery.  INR 1.61 Presently, she is alert and awake but with global aphasia that is receptive>>expressive. Of note, patient family member reports that she was using an inhaler that has Fentanyl on it.  Date last known well: 05/25/15 Time last known well: 11 pm tPA Given: no, out of the window     SUBJECTIVE (INTERVAL HISTORY) Multiple family members present. The patient is aphasic, somewhat lethargic, and had difficulty following commands. The patient's condition was discussed with the patient's  family members as well as prognosis, plan for further evaluation, treatment and answered questions.   OBJECTIVE Temp:  [98 F (36.7 C)-99 F (37.2 C)] 98 F (36.7 C) (12/19 1312) Pulse Rate:  [68-91] 72 (12/19 1312) Cardiac Rhythm:  [-] Normal sinus rhythm (12/19 0700) Resp:  [15-16] 16 (12/19 1312) BP: (144-177)/(62-90) 151/62 mmHg (12/19 1312) SpO2:  [99 %-100 %] 100 % (12/19 0919)  CBC:  Recent Labs Lab 05/26/15 0929  05/27/15 0110 05/28/15 0603  WBC 6.5  --  6.3 6.3  NEUTROABS 3.4  --   --   --   HGB 15.2*  < > 13.7 13.2  HCT 47.7*  < > 41.2 40.7  MCV 100.6*  --  98.6 98.1  PLT 215  --  211 191  < > = values in this interval not displayed.  Basic Metabolic Panel:   Recent Labs Lab  05/27/15 0110 05/28/15 0603 05/28/15 1041  NA 140 137  --   K 3.7 3.4*  --   CL 106 103  --   CO2 25 25  --   GLUCOSE 92 98  --   BUN 7 <5*  --   CREATININE 0.79 0.64  --   CALCIUM 8.5* 8.4*  --   MG  --   --  2.0    Lipid Panel:     Component Value Date/Time   CHOL 139 05/27/2015 0110   TRIG 62 05/27/2015 0110   HDL 54 05/27/2015 0110   CHOLHDL 2.6 05/27/2015 0110   VLDL 12 05/27/2015 0110   LDLCALC 73 05/27/2015 0110   HgbA1c:  Lab Results  Component Value Date   HGBA1C 5.6 05/26/2015   Urine Drug Screen:     Component Value Date/Time   LABOPIA NONE DETECTED 05/26/2015 1320   COCAINSCRNUR NONE DETECTED 05/26/2015 1320   LABBENZ NONE DETECTED 05/26/2015 1320   AMPHETMU NONE DETECTED 05/26/2015 1320   THCU NONE DETECTED 05/26/2015 1320   LABBARB NONE DETECTED 05/26/2015 1320      IMAGING  Ct Angio Head and Neck W/cm &/or Wo Cm 05/26/2015   1. Developing acute/subacute nonhemorrhagic infarct of posterior left insular cortex, superior left temporal, and left parietal lobe.  2. Moderate to severe focal stenosis within the more lateral left M2 branch with significant attenuation of distal MCA  branch vessels corresponding to the infarct territory.  3. No other significant proximal stenosis, aneurysm, or branch vessel occlusion.     Ct Head Wo Contrast 05/26/2015   Question early ischemia in distribution of the left middle cerebral artery. No other focal abnormality is noted.      Ct Cerebral Perfusion W/cm 05/26/2015   1. Large area of ischemia involving the posterior aspect of the left MCA territory. This involves the posterior left insular ribbon, superior left temporal lobe, and left parietal white.  2. More subtle cerebral blood volume changes with the area of core infarct constituting less than 1/3 of the left MCA territory.    MRI / MRA head 05/27/2015 1. Confirmation of an acute infarct involving the posterior left MCA territory. 2. Probable  petechial hemorrhage within the infarct. 3. Significant attenuation of proximal MCA branch vessels bilaterally without a more proximal stenosis or occlusion. 4. The study is moderately degraded by patient motion, limiting evaluation of medium and distal small vessels.   PHYSICAL EXAM (patient had received Ativan and morphine for MRI) Mental Status: The patient is aphasic. She was not able to follow commands at this time.  Cranial Nerves: II: Discs not visualized; Visual fields could not be tested. III,IV, VI: ptosis not present, question of mild left gaze preference V,VII: The patient is not following commands adequately tested for facial asymmetry. VIII: hearing appears normal. She does respond to voice IX,X: uvula could not be tested XI: bilateral shoulder shrug unable to test XII: Unable to follow commands to extend tongue Motor: The patient is moving all 4 extremities spontaneously Sensory: reacts to noxious stimuli Cerebellar: Unable to test  Gait:  Not attempted at this time   ASSESSMENT/PLAN [name] is a 59 y.o. female with history of hypertension, asthma, COPD, dyslipidemia, colon cancer, migraine headaches, obesity, obstructive sleep apnea, and DVT history  presenting with aphasia. She did not receive IV t-PA due to late presentation.  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  Resultant  aphasia  MRI  large left middle cerebral artery territory infarct.  MRA  Moderate to severe focal stenosis within the more lateral left M2 branch  Carotid Doppler  pending  2D Echo EF 50-55%. No cardiac source of emboli identified.  LDL 73  HgbA1c 5.6  VTE prophylaxis - IV heparin DIET - DYS 1 Room service appropriate?: Yes; Fluid consistency:: Thin  warfarin daily prior to admission, now on aspirin 300 mg suppository daily and heparin IV  Patient counseled to be compliant with her antithrombotic medications  Ongoing aggressive stroke  risk factor management  Therapy recommendations:  Pending  Disposition: Pending  Hypertension  Stable  Permissive hypertension (OK if < 220/120) but gradually normalize in 5-7 days  Hyperlipidemia  Home meds:  No lipid lowering medications prior to admission.  LDL 73, goal < 70  Now on Lipitor 40 mg daily  Continue statin at discharge    Other Stroke Risk Factors  Cigarette smoker, Will be advised to stop smoking  Obesity, Body mass index is 48.33 kg/(m^2).   Family hx stroke (father)  Migraines  Obstructive sleep apnea  Other Active Problems    Hospital day # 2         I have personally examined this patient, reviewed notes, independently viewed imaging studies, participated in medical decision making and plan of care. I have made any additions or clarifications directly to the above note. Agree with note above.   I had a  long discussion with the patient and multiple family members at the bedside and recommend checking a TEE to look for PFO or clot. Patient may need to be on long-term anticoagulation given history of pulmonary embolism. However if  A PFO was found may need to consider IVC filter or endovascular PFO closure. Discussed with medical teaching service resident Dr. Would consider starting NOAC. SETHI,PRAMOD   To contact Stroke Continuity provider, please refer to [email or web address] After hours, contact General Neurology

## 2015-05-28 NOTE — Progress Notes (Signed)
Pt experiencing N/V this A.M. Antiemetic administered notified on call of N/V and pertinent lab results, was contacted via phone for notification of pending orders which were reported to day shift

## 2015-05-28 NOTE — Progress Notes (Signed)
PT Cancellation Note  Patient Details [name] MRN: [id number] DOB: [birth year]   Cancelled Treatment:    Reason Eval/Treat Not Completed: Patient not medically ready. Noted Troponin continues to rise. Concern for PE with heparin ordered. Cardiology consult pending. Will defer PT eval until 12/20   Kelso Bibby 05/28/2015, 12:25 PM Pager [phone]

## 2015-05-28 NOTE — Progress Notes (Signed)
Patient ID: Brooke [name], female   DOB: [birth year], 59 y.o.   MRN: [id number] Medicine attending: I personally examined this patient this morning together with resident physician Dr. Shela Leff and I concur with her evaluation and management plan which we discussed together. We appreciate cardiology input. The patient continues to have a dense expressive and partial receptive aphasia. She does follow most commands appropriately. Right hand grip is weak. Pupils equal round reactive to light. Right facial droop. Large left MCA area infarct on imaging. Due to her difficulty communicating but suspicion that she may be having chest pain, troponin levels have been obtained and two values obtained so far are elevated. There are no acute ischemic changes on electrocardiogram which remains normal. I agree with cardiology, in view of her significant handicaps from an acute stroke and the fact that she is currently on aspirin and heparin, further aggressive intervention with respect to cardiac cath would not be indicated at this time. There was a typo in Dr. Nena Alexander note yesterday but she clearly meant to say that she did not think that this patient was having a pulmonary embolus to explain the elevated troponins in view of 100% oxygen saturation on room air, absence of tachycardia, and no suspicion of right ventricular strain on echocardiogram done yesterday. We will continue conservative treatment and reassess as she recovers from her stroke.

## 2015-05-28 NOTE — Progress Notes (Addendum)
Patient has been complaining of chest pain. Unfortunately, she recently suffered a CVA causing significant expressive aphasia so it is difficult to illicit a good history of the chest pain.   She did not have chest pain on admission. She can answer yes/no questions. She has no prior history of CAD or cath that I can see in her records, but does have significant risk factors, Heart Score 5 putting her at moderate risk of ACE. EKG yesterday was normal without ischemic changes. First troponin normal. Second troponin elevated at 0.12. I do not have a good explanation of her elevated troponin- no obvious causes of demand ischemia. Echo shows EF of 50-55%. I do not suspect PE as she is not tachycardic and satting well on room air and has been on heparin for history of DVT.  Assessment: NSTEMI DDx: MSK, GI  Plan: -Continue to trend troponins -Heparin per pharmacy for NSTEMI -ASA QD -Add protonix, tylenol -Continue morphine prn -Recheck EKG this am -Cardiology consulted, appreciate recommendations  Osa Craver, DO PGY-2 Internal Medicine Resident Pager # [phone] 05/28/2015 8:28 AM

## 2015-05-28 NOTE — Progress Notes (Signed)
Speech Language Pathology Treatment: Dysphagia;Cognitive-Linquistic  Patient Details [name] MRN: [id number] DOB: 02-08-1956 Today's Date: 05/28/2015 Time: 1009-1020 SLP Time Calculation (min) (ACUTE ONLY): 11 min  Assessment / Plan / Recommendation Clinical Impression  Pt consumed thin liquids and purees via very small self-fed boluses. Wet vocal quality is noted at baseline, and persists throughout trials due to weak cued coughing that is ineffective at clearing possible penetration of secretions versus POs. Anterior escape and immediate throat clearing are also noted with thin liquid trials, although no overt s/s of aspiration are observed with purees. Recommend to proceed with MBS to determine least restrictive diet. Pt in agreement - will schedule with radiology for this afternoon.   HPI HPI: 59 y.o. female brought in with acute receptive>>expressive aphasia. Woke up with stroke, with CT already showing question of early ischemia in distribution of the left middle cerebral artery.       SLP Plan  MBS     Recommendations  Diet recommendations: NPO Medication Administration: Crushed with puree       Oral Care Recommendations: Oral care QID Follow up Recommendations: Inpatient Rehab Plan: MBS   Brooke [name], M.A. CCC-SLP [phone]  Brooke [name] 05/28/2015, 10:54 AM

## 2015-05-28 NOTE — Progress Notes (Signed)
MBSS complete. Full report located under chart review in imaging section.  Jillianne Gamino Paiewonsky, M.A. CCC-SLP [phone]  

## 2015-05-28 NOTE — Progress Notes (Signed)
Thank you for cosult on Brooke [name]. Note that she was admitted with new onset aphasia due to stroke and has had issues with chest pain likely due to PE. She is currently on strict bedrest and therapy evaluations pending. Will await therapy evaluations to help determine best rehab venue.

## 2015-05-28 NOTE — Consult Note (Addendum)
Physical Medicine and Rehabilitation Consult  Reason for Consult:  Aphasia Referring Physician:  Dr. Eppie Gibson    HPI: Brooke [name] is a 59 y.o. female with history of HTN, COPD, Colon CA, Migraines, OSA, morbid obesity, DVT/PE on coumadin who was admitted on 05/26/15 after being found by family with confusion, lethargy and difficulty speaking on 05/26/15.  History taken from chart review. Last seen normal 11 pm the night before incident. CT brain with question of early ischemia in L-MCA distribution and INR- 1.61.  CTA with confirmed large area of ischemia involving posterior left insular ribbon, superior left temporal lobe, and left parietal white. MRI/MRA brain revealed large L-MCA territory infarct with moderate to severe focal stenosis within more lateral L-M2 branch. She was started on IV heparin for treatment.  2 D echo with EF 50-55%, mild aortic stenosis, trivial MR and no wall abnormality. She developed chest pain with elevated troponin and cardiology consulted for input. Dr. Mare Ferrari felt elevated troponin could bed due to stroke v/s ACS and CP appeared musculoskeletal in nature per exam-doubt PE. Patient not a candidate for cardiac cath and to consider switching to NOAC as patient on lifelong anticoagulation.  MBS done to evaluate dysphagia and patient started on dysphagia 1, thin liquids.   Review of Systems  Unable to perform ROS: language   Past Medical History  Diagnosis Date  . Hypertension   . Asthma   . COPD (chronic obstructive pulmonary disease) ([place])   . Anxiety   . Colon cancer ([place])     a. s/p surgery, chemotherapy.  . Dyslipidemia   . Migraine headache   . Chronic bronchitis   . Uterine fibroid   . Ovarian cyst   . GERD (gastroesophageal reflux disease)   . Morbid obesity ([place])   . Obstructive sleep apnea     mild  . Chronic pain   . Chronic back pain   . Arthritis   . Osteoarthritis   . Depression   . DVT (deep venous thrombosis) ([place]) 02/2014  .  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 PCCM recommended lifelong anticoagulation.  . Tobacco abuse   . PVC's (premature ventricular contractions) 2013  . Aortic stenosis     a. mild by echo 05/2015.    Past Surgical History  Procedure Laterality Date  . Knee arthroscopy      bil.  . Carpal tunnel release      rt  . Mouth surgery      teeth extraction  . Cesarean section    . Subtotal colectomy    . Colonoscopy    . Tubal ligation      Family History  Problem Relation Age of Onset  . Uterine cancer Mother   . Stroke Father   . Colon cancer Maternal Uncle   . Diabetes Father   . Ovarian cancer Mother   . Hypertension Father   . Breast cancer Maternal Grandmother   . Diabetes Sister   . Asthma Child   . Asthma Child     Social History:  reports that she has been smoking Cigarettes.  She has a 37 pack-year smoking history. She has never used smokeless tobacco. She reports that she does not drink alcohol or use illicit drugs.    Allergies  Allergen Reactions  . Kiwi Extract Anaphylaxis, Hives and Swelling    Makes her swell all over   . Aspirin Nausea Only    Can take as long as  enteric coated    Medications Prior to Admission  Medication Sig Dispense Refill  . acetaminophen (TYLENOL) 500 MG tablet Take 500 mg by mouth every 6 (six) hours as needed for headache.    . albuterol (PROVENTIL HFA;VENTOLIN HFA) 108 (90 BASE) MCG/ACT inhaler Inhale 2 puffs into the lungs every 6 (six) hours as needed for wheezing or shortness of breath (shortness of breath).    Marland Kitchen amitriptyline (ELAVIL) 50 MG tablet Take 50 mg by mouth at bedtime.    . citalopram (CELEXA) 20 MG tablet Take 20 mg by mouth daily.    . diphenhydrAMINE (BENADRYL) 25 MG tablet Take 1 tablet (25 mg total) by mouth every 6 (six) hours as needed (at first sign of headache). (Patient taking differently: Take 25 mg by mouth every 6 (six) hours as needed for itching or allergies. ) 10 tablet 0  . furosemide  (LASIX) 20 MG tablet Take 1 tablet (20 mg total) by mouth daily. 30 tablet 2  . gabapentin (NEURONTIN) 300 MG capsule Take 1 capsule (300 mg total) by mouth at bedtime. 30 capsule 0  . LORazepam (ATIVAN) 1 MG tablet Take 1 tablet (1 mg total) by mouth every 6 (six) hours as needed for anxiety (anxiety). 30 tablet 0  . omeprazole (PRILOSEC) 40 MG capsule Take 1 capsule (40 mg total) by mouth daily. 30 capsule 3  . oxyCODONE-acetaminophen (PERCOCET) 7.5-325 MG tablet Take 1 tablet by mouth every 6 (six) hours as needed for moderate pain. pain  0  . potassium chloride SA (K-DUR,KLOR-CON) 20 MEQ tablet Take 1 tablet (20 mEq total) by mouth daily. 10 tablet 0  . warfarin (COUMADIN) 5 MG tablet Take 1 tablet (5 mg total) by mouth daily. 30 tablet 2  . B Complex Vitamins (VITAMIN-B COMPLEX PO) Take 1 tablet by mouth daily.    . cyclobenzaprine (FLEXERIL) 10 MG tablet Take 1 tablet (10 mg total) by mouth 2 (two) times daily as needed for muscle spasms. 20 tablet 0  . ipratropium-albuterol (DUONEB) 0.5-2.5 (3) MG/3ML SOLN Take 3 mLs by nebulization every 6 (six) hours. (Patient not taking: Reported on 05/26/2015) 360 mL 1    Home: Home Living Family/patient expects to be discharged to:: Private residence Living Arrangements: Children  Functional History:   Functional Status:  Mobility:          ADL:    Cognition: Cognition Overall Cognitive Status: Impaired/Different from baseline Arousal/Alertness: Lethargic Orientation Level: Other (comment) (Pt unable to speak clearly, seems to be oriented) Attention: Focused, Sustained Focused Attention: Appears intact Sustained Attention: Impaired Sustained Attention Impairment: Verbal complex, Functional complex Memory: Appears intact Awareness: Impaired Awareness Impairment: Intellectual impairment, Emergent impairment, Anticipatory impairment Problem Solving: Appears intact Behaviors: Restless, Impulsive, Perseveration, Poor frustration  tolerance Safety/Judgment: Impaired Cognition Overall Cognitive Status: Impaired/Different from baseline   Blood pressure 151/62, pulse 72, temperature 98 F (36.7 C), temperature source Oral, resp. rate 16, height 4\' 11"  (1.499 m), weight 108.6 kg (239 lb 6.7 oz), last menstrual period 05/24/2003, SpO2 100 %. Physical Exam  Vitals reviewed. Constitutional: She appears well-developed and well-nourished.  Obese  HENT:  Head: Normocephalic and atraumatic.  Eyes: Conjunctivae and EOM are normal.  Neck: Normal range of motion. Neck supple.  Cardiovascular: Normal rate and regular rhythm.   Respiratory: Effort normal and breath sounds normal. No respiratory distress.  GI: Soft. Bowel sounds are normal. There is no tenderness.  Musculoskeletal: She exhibits no edema or tenderness.  PROM WNL  Neurological: She is alert. She  has normal reflexes.  Fluent > nonfluent aphasia Unaware of deficits Occasionally follows 1 step commands Unable to assess MMT, sensation, CNs due to lack patient participation  Skin: Skin is warm and dry. No erythema.  Psychiatric: Her affect is blunt and inappropriate. Her speech is delayed and slurred. She is slowed. Cognition and memory are impaired. She expresses impulsivity. She is inattentive.    Results for orders placed or performed during the hospital encounter of 05/26/15 (from the past 24 hour(s))  Heparin level (unfractionated)     Status: Abnormal   Collection Time: 05/27/15  8:49 PM  Result Value Ref Range   Heparin Unfractionated 0.75 (H) 0.30 - 0.70 IU/mL  Troponin I     Status: None   Collection Time: 05/27/15  9:59 PM  Result Value Ref Range   Troponin I <0.03 <0.031 ng/mL  CBC     Status: None   Collection Time: 05/28/15  6:03 AM  Result Value Ref Range   WBC 6.3 4.0 - 10.5 K/uL   RBC 4.15 3.87 - 5.11 MIL/uL   Hemoglobin 13.2 12.0 - 15.0 g/dL   HCT 40.7 36.0 - 46.0 %   MCV 98.1 78.0 - 100.0 fL   MCH 31.8 26.0 - 34.0 pg   MCHC 32.4 30.0  - 36.0 g/dL   RDW 13.8 11.5 - 15.5 %   Platelets 191 150 - 400 K/uL  Troponin I     Status: Abnormal   Collection Time: 05/28/15  6:03 AM  Result Value Ref Range   Troponin I 0.12 (H) <0.031 ng/mL  Basic metabolic panel     Status: Abnormal   Collection Time: 05/28/15  6:03 AM  Result Value Ref Range   Sodium 137 135 - 145 mmol/L   Potassium 3.4 (L) 3.5 - 5.1 mmol/L   Chloride 103 101 - 111 mmol/L   CO2 25 22 - 32 mmol/L   Glucose, Bld 98 65 - 99 mg/dL   BUN <5 (L) 6 - 20 mg/dL   Creatinine, Ser 0.64 0.44 - 1.00 mg/dL   Calcium 8.4 (L) 8.9 - 10.3 mg/dL   GFR calc non Af Amer >60 >60 mL/min   GFR calc Af Amer >60 >60 mL/min   Anion gap 9 5 - 15  Heparin level (unfractionated)     Status: Abnormal   Collection Time: 05/28/15  6:03 AM  Result Value Ref Range   Heparin Unfractionated 0.95 (H) 0.30 - 0.70 IU/mL  Troponin I     Status: Abnormal   Collection Time: 05/28/15 10:41 AM  Result Value Ref Range   Troponin I 0.24 (H) <0.031 ng/mL  Magnesium     Status: None   Collection Time: 05/28/15 10:41 AM  Result Value Ref Range   Magnesium 2.0 1.7 - 2.4 mg/dL  Heparin level (unfractionated)     Status: Abnormal   Collection Time: 05/28/15  2:35 PM  Result Value Ref Range   Heparin Unfractionated <0.10 (L) 0.30 - 0.70 IU/mL   Mr Jodene Nam Head Wo Contrast  05/27/2015  CLINICAL DATA:  Left MCA territory infarct. Abnormal speech. Facial droop. The examination had to be discontinued prior to completion due to patient unable to tolerate despite medication. EXAM: MRI HEAD WITHOUT CONTRAST MRA HEAD WITHOUT CONTRAST TECHNIQUE: Multiplanar, multiecho pulse sequences of the brain and surrounding structures were obtained without intravenous contrast. Angiographic images of the head were obtained using MRA technique without contrast. COMPARISON:  CTA head and neck, CT perfusion, and CT head 05/26/2015 FINDINGS:  MRI HEAD FINDINGS The diffusion-weighted images confirm an acute infarct involving the  posterior aspect of the left MCA territory. The posterior tooth there is the left insular cortex are involved. There is involvement of the posterior left frontal lobe, left parietal lobe, in the superior left temporal lobe. There is significant patient motion which could cause some artifact on the susceptibility weighted imaging. However, petechial hemorrhages suspected within the left temporal and parietal lobe infarct. MRA HEAD FINDINGS The MRA is also distorted by patient motion. Internal carotid arteries are within normal limits from the high cervical segments through the ICA termini bilaterally. The A1 and M1 segments are intact. The MCA bifurcations are intact. There is significant attenuation of MCA branch vessels bilaterally, distorted by patient motion. The left vertebral artery is the dominant vessel. The PICA origins are visualized and normal bilaterally. The basilar artery is within normal limits. Both posterior cerebral arteries originate from basilar tip. The posterior cerebral arteries are within normal limits bilaterally. IMPRESSION: 1. Confirmation of an acute infarct involving the posterior left MCA territory. 2. Probable petechial hemorrhage within the infarct. 3. Significant attenuation of proximal MCA branch vessels bilaterally without a more proximal stenosis or occlusion. 4. The study is moderately degraded by patient motion, limiting evaluation of medium and distal small vessels. Electronically Signed   By: San Morelle M.D.   On: 05/27/2015 12:03   Mr Brain Wo Contrast  05/27/2015  CLINICAL DATA:  Left MCA territory infarct. Abnormal speech. Facial droop. The examination had to be discontinued prior to completion due to patient unable to tolerate despite medication. EXAM: MRI HEAD WITHOUT CONTRAST MRA HEAD WITHOUT CONTRAST TECHNIQUE: Multiplanar, multiecho pulse sequences of the brain and surrounding structures were obtained without intravenous contrast. Angiographic images of  the head were obtained using MRA technique without contrast. COMPARISON:  CTA head and neck, CT perfusion, and CT head 05/26/2015 FINDINGS: MRI HEAD FINDINGS The diffusion-weighted images confirm an acute infarct involving the posterior aspect of the left MCA territory. The posterior tooth there is the left insular cortex are involved. There is involvement of the posterior left frontal lobe, left parietal lobe, in the superior left temporal lobe. There is significant patient motion which could cause some artifact on the susceptibility weighted imaging. However, petechial hemorrhages suspected within the left temporal and parietal lobe infarct. MRA HEAD FINDINGS The MRA is also distorted by patient motion. Internal carotid arteries are within normal limits from the high cervical segments through the ICA termini bilaterally. The A1 and M1 segments are intact. The MCA bifurcations are intact. There is significant attenuation of MCA branch vessels bilaterally, distorted by patient motion. The left vertebral artery is the dominant vessel. The PICA origins are visualized and normal bilaterally. The basilar artery is within normal limits. Both posterior cerebral arteries originate from basilar tip. The posterior cerebral arteries are within normal limits bilaterally. IMPRESSION: 1. Confirmation of an acute infarct involving the posterior left MCA territory. 2. Probable petechial hemorrhage within the infarct. 3. Significant attenuation of proximal MCA branch vessels bilaterally without a more proximal stenosis or occlusion. 4. The study is moderately degraded by patient motion, limiting evaluation of medium and distal small vessels. Electronically Signed   By: San Morelle M.D.   On: 05/27/2015 12:03   Dg Swallowing Func-speech Pathology  05/28/2015  Objective Swallowing Evaluation:   Patient Details [name] MRN: [id number] Date of Birth: [birth year] Today's Date: 05/28/2015 Time: SLP Start Time  (ACUTE ONLY): 1321-SLP Stop Time (ACUTE ONLY):  1335 SLP Time Calculation (min) (ACUTE ONLY): 14 min Past Medical History: Past Medical History Diagnosis Date . Hypertension  . Asthma  . COPD (chronic obstructive pulmonary disease) ([place])  . Anxiety  . Colon cancer ([place])    a. s/p surgery, chemotherapy. . Dyslipidemia  . Migraine headache  . Chronic bronchitis  . Uterine fibroid  . Ovarian cyst  . GERD (gastroesophageal reflux disease)  . Morbid obesity ([place])  . Obstructive sleep apnea    mild . Chronic pain  . Chronic back pain  . Arthritis  . Osteoarthritis  . Depression  . DVT (deep venous thrombosis) ([place]) 02/2014 . Bilateral pulmonary embolism ([place]) 02/2014   a. PCCM recommended lifelong anticoagulation. . Tobacco abuse  . PVC's (premature ventricular contractions) 2013 . Aortic stenosis    a. mild by echo 05/2015. Past Surgical History: Past Surgical History Procedure Laterality Date . Knee arthroscopy     bil. . Carpal tunnel release     rt . Mouth surgery     teeth extraction . Cesarean section   . Subtotal colectomy   . Colonoscopy   . Tubal ligation   HPI: 59 y.o. female brought in with acute receptive>>expressive aphasia. Woke up with stroke, with CT already showing question of early ischemia in distribution of the left middle cerebral artery.  Subjective: pt expressively aphasic Assessment / Plan / Recommendation CHL IP CLINICAL IMPRESSIONS 05/28/2015 Therapy Diagnosis Moderate oral phase dysphagia Clinical Impression Pt has a moderate oral dysphagia, although has good strength and timing of her pharyngeal phase. She has prolonged oral preparation and mastication, with small amounts of oral residue remaining after the swallow. Min cues provided for continued mastication with soft solid. Recommend initiation of Dys 1 textures and thin liquids, meds crushed in puree. Impact on safety and function Mild aspiration risk   CHL IP TREATMENT RECOMMENDATION 05/28/2015 Treatment Recommendations Therapy as outlined in  treatment plan below   Prognosis 05/28/2015 Prognosis for Safe Diet Advancement Good Barriers to Reach Goals -- Barriers/Prognosis Comment -- CHL IP DIET RECOMMENDATION 05/28/2015 SLP Diet Recommendations Dysphagia 1 (Puree) solids;Thin liquid Liquid Administration via Cup;Straw Medication Administration Crushed with puree Compensations Minimize environmental distractions;Slow rate;Small sips/bites;Lingual sweep for clearance of pocketing Postural Changes Seated upright at 90 degrees   CHL IP OTHER RECOMMENDATIONS 05/28/2015 Recommended Consults -- Oral Care Recommendations Oral care BID;Oral care before and after PO Other Recommendations --   CHL IP FOLLOW UP RECOMMENDATIONS 05/28/2015 Follow up Recommendations Inpatient Rehab   CHL IP FREQUENCY AND DURATION 05/28/2015 Speech Therapy Frequency (ACUTE ONLY) min 2x/week Treatment Duration 2 weeks      CHL IP ORAL PHASE 05/28/2015 Oral Phase Impaired Oral - Pudding Teaspoon -- Oral - Pudding Cup -- Oral - Honey Teaspoon -- Oral - Honey Cup -- Oral - Nectar Teaspoon -- Oral - Nectar Cup -- Oral - Nectar Straw -- Oral - Thin Teaspoon -- Oral - Thin Cup Weak lingual manipulation;Reduced posterior propulsion;Lingual/palatal residue Oral - Thin Straw Weak lingual manipulation;Reduced posterior propulsion;Lingual/palatal residue Oral - Puree Weak lingual manipulation;Reduced posterior propulsion;Lingual/palatal residue Oral - Mech Soft Weak lingual manipulation;Reduced posterior propulsion;Lingual/palatal residue;Impaired mastication Oral - Regular -- Oral - Multi-Consistency -- Oral - Pill -- Oral Phase - Comment --  CHL IP PHARYNGEAL PHASE 05/28/2015 Pharyngeal Phase WFL Pharyngeal- Pudding Teaspoon -- Pharyngeal -- Pharyngeal- Pudding Cup -- Pharyngeal -- Pharyngeal- Honey Teaspoon -- Pharyngeal -- Pharyngeal- Honey Cup -- Pharyngeal -- Pharyngeal- Nectar Teaspoon -- Pharyngeal -- Pharyngeal- Nectar Cup -- Pharyngeal -- Pharyngeal- Nectar  Straw -- Pharyngeal --  Pharyngeal- Thin Teaspoon -- Pharyngeal -- Pharyngeal- Thin Cup -- Pharyngeal -- Pharyngeal- Thin Straw -- Pharyngeal -- Pharyngeal- Puree -- Pharyngeal -- Pharyngeal- Mechanical Soft -- Pharyngeal -- Pharyngeal- Regular -- Pharyngeal -- Pharyngeal- Multi-consistency -- Pharyngeal -- Pharyngeal- Pill -- Pharyngeal -- Pharyngeal Comment --  CHL IP CERVICAL ESOPHAGEAL PHASE 05/28/2015 Cervical Esophageal Phase WFL Pudding Teaspoon -- Pudding Cup -- Honey Teaspoon -- Honey Cup -- Nectar Teaspoon -- Nectar Cup -- Nectar Straw -- Thin Teaspoon -- Thin Cup -- Thin Straw -- Puree -- Mechanical Soft -- Regular -- Multi-consistency -- Pill -- Cervical Esophageal Comment -- [name], M.A. CCC-SLP [phone] [name] 05/28/2015, 2:11 PM               Assessment/Plan: Diagnosis: L-MCA infarct Labs and images independently reviewed.  Records reviewed and summated above. Stroke: Continue secondary stroke prophylaxis and Risk Factor Modification listed below:   Antiplatelet therapy Blood Pressure Management:  Continue current medication with prn's with permisive HTN per primary team Statin Agent Tobacco abuse:  Council when appropriate ?hemiparesis: fit for orthotics to prevent contractures (resting hand splint for day, wrist cock up splint at night, PRAFO, etc) if necessary  Motor recovery: Fluoxetine if necessary  1. Does the need for close, 24 hr/day medical supervision in concert with the patient's rehab needs make it unreasonable for this patient to be served in a less intensive setting? Yes  2. Co-Morbidities requiring supervision/potential complications: HTN (monitor and provide prns in accordance with increased physical exertion and pain), COPD (continue to monitor for SOB and tachypnea with increased physical exertion, monitor Sats), Migraines (ensure pain does not limit therapies), OSA (monitor for daytime fatigue, CPAP), morbid obesity (Body mass index is 48.33 kg/(m^2)., Cont diet  and exercise education when appropriate, cont to encourage weight loss to increase endurance and promote overall health), DVT/PE (Cont to monitor RR, chest pain and meds), elevated troponins (cont to monitor chest pain) 3. Due to safety, disease management, medication administration and patient education, does the patient require 24 hr/day rehab nursing? Yes 4. Does the patient require coordinated care of a physician, rehab nurse, PT (1-2 hrs/day, 5 days/week), OT (1-2 hrs/day, 5 days/week) and SLP (1-2 hrs/day, 5 days/week) to address physical and functional deficits in the context of the above medical diagnosis(es)? Yes Addressing deficits in the following areas: balance, endurance, locomotion, strength, transferring, bathing, dressing, feeding, grooming, toileting, cognition, speech, language, swallowing and psychosocial support 5. Can the patient actively participate in an intensive therapy program of at least 3 hrs of therapy per day at least 5 days per week? Potentially 6. The potential for patient to make measurable gains while on inpatient rehab is excellent 7. Anticipated functional outcomes upon discharge from inpatient rehab are Undetermined at present  with PT, Undetermined at present with OT, Undetermined at present with SLP. 8. Estimated rehab length of stay to reach goals is:  Possibly 19-20 days, however further information is needed  9. Does the patient have adequate social supports and living environment to accommodate these discharge functional goals? Uncertain 10. Anticipated D/C setting: To be determined 11. Anticipated post D/C treatments: HH therapy and Home excercise program 12. Overall Rehab/Functional Prognosis: good  RECOMMENDATIONS: This patient's condition is appropriate for continued rehabilitative care in the following setting: Possibly CIR, however patient's discharged disposition is unknown at present. In addition patient has not been evaluated by therapies and medical  workup appears ongoing. We will await therapy evaluation, completion of medical workup,  and verificatoin of discharge disposition prior to making determination for CIR. Will continue to follow   Patient has agreed to participate in recommended program. Uncertain Note that insurance prior authorization may be required for reimbursement for recommended care.  Comment: Rehab Admissions Coordinator to follow up  Delice Lesch, MD 05/28/2015

## 2015-05-28 NOTE — Progress Notes (Signed)
OT Cancellation Note  Patient Details Name: YOLOTZIN MASCARENAS MRN: [id number] DOB: 1956/03/07   Cancelled Treatment:    Reason Eval/Treat Not Completed: Patient not medically ready. Pt on strict bedrest (most recent activity order).  Redmond Baseman, OTR/L [phone]   05/28/2015, 9:01 AM

## 2015-05-28 NOTE — Consult Note (Signed)
Cardiology Consultation Note  Patient ID: Brooke [name], MRN: [id number], DOB/AGE: Feb 21, 1956 59 y.o. Admit date: 05/26/2015   Date of Consult: 05/28/2015 Primary Physician: Lorayne Marek, MD Primary Cardiologist: Dr. Percival Spanish in 2013  Chief Complaint: aphasia Reason for Consultation: chest pain Requesting MD: Dr. Marlowe Sax  HPI: Brooke [name] is a 59 y/o F with history of tobacco abuse, GERD, colon CA s/p surgery, chemotherapy, asthma/COPD, PVCs/ventricular bigeminy (evaluated by Dr. 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  Per review of chart she was evaluated by Dr. Percival Spanish for PVCs in 2013 and was supposed to have a Holter but it doesn't look like this was done. She's had prior admissions for chest pain rule out as well as acute pulmonary exacerbations. Prior echoes have demonstrated low-normal EF 50-55%. Her most recent hospitalization was in 04/2014 for acute somnolence after taking benzo/oxycodone with alcohol. She was admitted 05/27/15 after being found by family with altered mental status. There is no family present and the patient is aphasic, thus history is obtained from the chart. Per records, the morning of admission the family found the patient to be confused, aphasic, not responding to questions, and with a facial droop. Apparently the night before she was noted to be dozing off while eating ice cream. One week prior she had a similar speech issue and needed help with walking and dressing herself. Brain imaging this admission confirmed acute infarct involving the posterior left MCA territory, probable petechial hemorrhage within the infarct, and CT perfusion showed cerebrovascular disease (moderate to severe focal stenosis  within the more lateral left M2 branch with significant attenuation of distal MCA branch vessels corresponding to the infarct territory). Carotid duplex in progress. Labwork notable for neg EtOH/UDS, Hgb 15.2 (macrocytic), K 5.2, subtherapeutic INR at 1.61, LDL 73. Per notes, the patient has been gesturing towards indicating chest pain since admission. Troponins <0.03->0.12->0.24. She has been on heparin per pharmacy since admission - she remains NPO due to concern for ineffective swallowing. She is now on aspirin 300mg  suppositories. Neurology had recommended to consider starting NOAC. 2D Echo 05/27/15: EF 50-55%, grade 1 DD, mild AS, mild TR, PASP 40, no cardiac source of emboli identified. BPs modestly elevated but being managed with permissive HTN.  It is impossible to obtain further history from Brooke [name] due to her aphasia. When asked where her pain is, she gestures to her left chest. She does have significant tenderness to even moderate palpation in this area. She is not tachycardic, tachypneic or hypoxic. Telemetry with NSR/ST only, no afib seen.  Past Medical History  Diagnosis Date  . Hypertension   . Asthma   . COPD (chronic obstructive pulmonary disease) ([place])   . Anxiety   . Colon cancer ([place])     a. s/p surgery, chemotherapy.  . Dyslipidemia   . Migraine headache   . Chronic bronchitis   . Uterine fibroid   . Ovarian cyst   . GERD (gastroesophageal reflux disease)   . Morbid obesity ([place])   . Obstructive sleep apnea     mild  . Chronic pain   . Chronic back pain   . Arthritis   . Osteoarthritis   . Depression   . DVT (deep venous thrombosis) ([place]) 02/2014  . 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 PCCM recommended lifelong anticoagulation.  . Tobacco abuse   . PVC's (premature ventricular contractions) 2013  . Aortic stenosis     a. mild by echo 05/2015.     Surgical History:  Past Surgical History  Procedure Laterality Date  . Knee arthroscopy      bil.    . Carpal tunnel release      rt  . Mouth surgery      teeth extraction  . Cesarean section    . Subtotal colectomy    . Colonoscopy    . Tubal ligation       Home Meds: Prior to Admission medications   Medication Sig Start Date End Date Taking? Authorizing Provider  acetaminophen (TYLENOL) 500 MG tablet Take 500 mg by mouth every 6 (six) hours as needed for headache.   Yes Historical Provider, MD  albuterol (PROVENTIL HFA;VENTOLIN HFA) 108 (90 BASE) MCG/ACT inhaler Inhale 2 puffs into the lungs every 6 (six) hours as needed for wheezing or shortness of breath (shortness of breath). 02/20/14  Yes Oswald Hillock, MD  amitriptyline (ELAVIL) 50 MG tablet Take 50 mg by mouth at bedtime. 03/27/15  Yes Historical Provider, MD  citalopram (CELEXA) 20 MG tablet Take 20 mg by mouth daily. 03/01/15  Yes Historical Provider, MD  diphenhydrAMINE (BENADRYL) 25 MG tablet Take 1 tablet (25 mg total) by mouth every 6 (six) hours as needed (at first sign of headache). Patient taking differently: Take 25 mg by mouth every 6 (six) hours as needed for itching or allergies.  02/15/15  Yes Sharlett Iles, MD  furosemide (LASIX) 20 MG tablet Take 1 tablet (20 mg total) by mouth daily. 08/25/14  Yes Lorayne Marek, MD  gabapentin (NEURONTIN) 300 MG capsule Take 1 capsule (300 mg total) by mouth at bedtime. 10/18/14  Yes Melony Overly, MD  LORazepam (ATIVAN) 1 MG tablet Take 1 tablet (1 mg total) by mouth every 6 (six) hours as needed for anxiety (anxiety). 08/25/14  Yes Lorayne Marek, MD  omeprazole (PRILOSEC) 40 MG capsule Take 1 capsule (40 mg total) by mouth daily. 08/25/14  Yes Lorayne Marek, MD  oxyCODONE-acetaminophen (PERCOCET) 7.5-325 MG tablet Take 1 tablet by mouth every 6 (six) hours as needed for moderate pain. pain 03/28/15  Yes Historical Provider, MD  potassium chloride SA (K-DUR,KLOR-CON) 20 MEQ tablet Take 1 tablet (20 mEq total) by mouth daily. 08/25/14  Yes Lorayne Marek, MD  warfarin (COUMADIN) 5 MG  tablet Take 1 tablet (5 mg total) by mouth daily. 08/25/14  Yes Lorayne Marek, MD  B Complex Vitamins (VITAMIN-B COMPLEX PO) Take 1 tablet by mouth daily.    Historical Provider, MD  cyclobenzaprine (FLEXERIL) 10 MG tablet Take 1 tablet (10 mg total) by mouth 2 (two) times daily as needed for muscle spasms. 10/26/14   Evelina Bucy, MD  ipratropium-albuterol (DUONEB) 0.5-2.5 (3) MG/3ML SOLN Take 3 mLs by nebulization every 6 (six) hours. Patient not taking: Reported on 05/26/2015 04/23/15   Theodis Blaze, MD    Inpatient Medications:  . antiseptic oral rinse  7 mL Mouth Rinse q12n4p  . aspirin  300 mg Rectal Daily  . atorvastatin  40 mg Oral q1800  . chlorhexidine  15 mL Mouth Rinse BID  . pantoprazole (PROTONIX) IV  40 mg Intravenous Daily  . sodium chloride  10-40 mL Intracatheter Q12H  . sodium chloride  3 mL Intravenous Q12H   . dextrose 5 % and 0.9 % NaCl with KCl 20 mEq/L  Allergies:  Allergies  Allergen Reactions  . Kiwi Extract Anaphylaxis, Hives and Swelling    Makes her swell all over   . Aspirin Nausea Only    Can take as long as enteric coated    Social History   Social History  . Marital Status: Widowed    Spouse Name: N/A  . Number of Children: 2  . Years of Education: N/A   Occupational History  . disabled    Social History Main Topics  . Smoking status: Current Some Day Smoker -- 1.00 packs/day for 37 years    Types: Cigarettes  . Smokeless tobacco: Never Used     Comment: trying to quit, down to .5ppd X2 years ago.   . Alcohol Use: No  . Drug Use: No  . Sexual Activity: Yes   Other Topics Concern  . Not on file   Social History Narrative   Lives alone.  Widowed but has a female companion who helps out.  Ambulates with a cane.  Drinks coffee daily.              Family History  Problem Relation Age of Onset  . Uterine cancer Mother   . Stroke Father   . Colon cancer Maternal Uncle   . Diabetes Father   . Ovarian cancer Mother   .  Hypertension Father   . Breast cancer Maternal Grandmother   . Diabetes Sister   . Asthma Child   . Asthma Child      Review of Systems: Unable to obtain due to aphasia  Labs:  Recent Labs  05/27/15 2159 05/28/15 0603 05/28/15 1041  TROPONINI <0.03 0.12* 0.24*   Lab Results  Component Value Date   WBC 6.3 05/28/2015   HGB 13.2 05/28/2015   HCT 40.7 05/28/2015   MCV 98.1 05/28/2015   PLT 191 05/28/2015    Recent Labs Lab 05/26/15 0929  05/28/15 0603  NA 138  < > 137  K 5.2*  < > 3.4*  CL 105  < > 103  CO2 23  < > 25  BUN 9  < > <5*  CREATININE 1.06*  < > 0.64  CALCIUM 9.0  < > 8.4*  PROT 7.5  --   --   BILITOT 1.0  --   --   ALKPHOS 85  --   --   ALT 13*  --   --   AST 24  --   --   GLUCOSE 95  < > 98  < > = values in this interval not displayed. Lab Results  Component Value Date   CHOL 139 05/27/2015   HDL 54 05/27/2015   LDLCALC 73 05/27/2015   TRIG 62 05/27/2015     Radiology/Studies:  Ct Angio Head W/cm &/or Wo Cm  05/26/2015  CLINICAL DATA:  Neck up stroke. Altered mental status. Unable to speak clearly. Right-sided weakness and facial droop. EXAM: CT ANGIOGRAPHY HEAD AND NECK TECHNIQUE: Multidetector CT imaging of the head and neck was performed using the standard protocol during bolus administration of intravenous contrast. Multiplanar CT image reconstructions and MIPs were obtained to evaluate the vascular anatomy. Carotid stenosis measurements (when applicable) are obtained utilizing NASCET criteria, using the distal internal carotid diameter as the denominator. CONTRAST:  62mL OMNIPAQUE IOHEXOL 350 MG/ML SOLN COMPARISON:  CT profusion study from the same day. CT head without contrast from the same day. FINDINGS: CT HEAD Brain: Source images confirm an acute infarct involving the posterior left insular ribbon  is well is the cortex over the superior left temporal lobe and left parietal lobe. No acute hemorrhage or mass lesion is present. The ganglia are  intact. Calvarium and skull base: Negative. Paranasal sinuses: Clear Orbits: Within normal limits. CTA NECK Aortic arch: A 3 vessel arch configuration is present. Atherosclerotic calcifications are present without significant stenoses at the origins the great vessels. Right carotid system: The right common carotid artery is within normal limits. Minimal atherosclerotic changes are present at the carotid bifurcation. The cervical right ICA is normal. Left carotid system: The left common carotid artery is within normal limits. Atherosclerotic changes are slightly more prominent within the left carotid bifurcation. There is no significant stenosis relative to the more distal vessel. The cervical left ICA is normal. Vertebral arteries:The vertebral arteries both originate from the subclavian arteries without significant stenosis. The left vertebral artery is dominant. There is no focal stenosis or vascular injury within the cervical vertebral arteries. Skeleton: Mild endplate degenerative changes are most noted at C3-4 and C4-5. No focal lytic or blastic lesions are present. Other neck: The soft tissues of the neck are unremarkable. CTA HEAD Anterior circulation: The internal carotid arteries are within normal limits from the high cervical segments through the ICA termini. The left A1 segment is dominant. The M1 segments are intact bilaterally. There is a significant proximal stenosis in the more lateral left M2 segment with significant attenuation of more distal branch vessels no focal proximal occlusion is present. Right MCA and bilateral ACA branch vessels are within normal limits. Posterior circulation: The left vertebral artery is slightly dominant to the right. The PICA origins are visualized and normal bilaterally. Basilar artery is within normal limits. Both posterior cerebral arteries originate the basilar tip. PCA branch vessels are intact. Venous sinuses: The dural sinuses are patent. The right transverse  sinus is dominant. The straight sinus and deep cerebral veins are patent. Cortical veins are intact. Anatomic variants: None Delayed phase: Postcontrast images better demonstrate the areas of developing acute nonhemorrhagic infarction involving the posterior left insular cortex, left temporal lobe, and left parietal lobe. No pathologic enhancement is evident. IMPRESSION: 1. Developing acute/subacute nonhemorrhagic infarct of posterior left insular cortex, superior left temporal, and left parietal lobe. 2. Moderate to severe focal stenosis within the more lateral left M2 branch with significant attenuation of distal MCA branch vessels corresponding to the infarct territory. 3. No other significant proximal stenosis, aneurysm, or branch vessel occlusion. These results were called by telephone at the time of interpretation on 05/26/2015 at 12:51 Pm to Dr. Aram Beecham, who verbally acknowledged these results. Electronically Signed   By: San Morelle M.D.   On: 05/26/2015 13:10   Ct Head Wo Contrast  05/26/2015  CLINICAL DATA:  Unresponsive EXAM: CT HEAD WITHOUT CONTRAST TECHNIQUE: Contiguous axial images were obtained from the base of the skull through the vertex without intravenous contrast. COMPARISON:  04/21/2015 FINDINGS: Bony calvarium is intact. No acute hemorrhage or space-occupying mass lesion is noted. There is some blunting of the sulcal markings in the left parietal lobe near the vertex which may represent some very early ischemia. No other focal abnormality is noted. IMPRESSION: Question early ischemia in distribution of the left middle cerebral artery. No other focal abnormality is noted. These results were called by telephone at the time of interpretation on 05/26/2015 at 9:11 am to Dr. Jola Schmidt , who verbally acknowledged these results. Electronically Signed   By: Inez Catalina M.D.   On: 05/26/2015 09:11   Ct  Angio Neck W/cm &/or Wo/cm  05/26/2015  CLINICAL DATA:  Neck up stroke. Altered  mental status. Unable to speak clearly. Right-sided weakness and facial droop. EXAM: CT ANGIOGRAPHY HEAD AND NECK TECHNIQUE: Multidetector CT imaging of the head and neck was performed using the standard protocol during bolus administration of intravenous contrast. Multiplanar CT image reconstructions and MIPs were obtained to evaluate the vascular anatomy. Carotid stenosis measurements (when applicable) are obtained utilizing NASCET criteria, using the distal internal carotid diameter as the denominator. CONTRAST:  44mL OMNIPAQUE IOHEXOL 350 MG/ML SOLN COMPARISON:  CT profusion study from the same day. CT head without contrast from the same day. FINDINGS: CT HEAD Brain: Source images confirm an acute infarct involving the posterior left insular ribbon is well is the cortex over the superior left temporal lobe and left parietal lobe. No acute hemorrhage or mass lesion is present. The ganglia are intact. Calvarium and skull base: Negative. Paranasal sinuses: Clear Orbits: Within normal limits. CTA NECK Aortic arch: A 3 vessel arch configuration is present. Atherosclerotic calcifications are present without significant stenoses at the origins the great vessels. Right carotid system: The right common carotid artery is within normal limits. Minimal atherosclerotic changes are present at the carotid bifurcation. The cervical right ICA is normal. Left carotid system: The left common carotid artery is within normal limits. Atherosclerotic changes are slightly more prominent within the left carotid bifurcation. There is no significant stenosis relative to the more distal vessel. The cervical left ICA is normal. Vertebral arteries:The vertebral arteries both originate from the subclavian arteries without significant stenosis. The left vertebral artery is dominant. There is no focal stenosis or vascular injury within the cervical vertebral arteries. Skeleton: Mild endplate degenerative changes are most noted at C3-4 and C4-5.  No focal lytic or blastic lesions are present. Other neck: The soft tissues of the neck are unremarkable. CTA HEAD Anterior circulation: The internal carotid arteries are within normal limits from the high cervical segments through the ICA termini. The left A1 segment is dominant. The M1 segments are intact bilaterally. There is a significant proximal stenosis in the more lateral left M2 segment with significant attenuation of more distal branch vessels no focal proximal occlusion is present. Right MCA and bilateral ACA branch vessels are within normal limits. Posterior circulation: The left vertebral artery is slightly dominant to the right. The PICA origins are visualized and normal bilaterally. Basilar artery is within normal limits. Both posterior cerebral arteries originate the basilar tip. PCA branch vessels are intact. Venous sinuses: The dural sinuses are patent. The right transverse sinus is dominant. The straight sinus and deep cerebral veins are patent. Cortical veins are intact. Anatomic variants: None Delayed phase: Postcontrast images better demonstrate the areas of developing acute nonhemorrhagic infarction involving the posterior left insular cortex, left temporal lobe, and left parietal lobe. No pathologic enhancement is evident. IMPRESSION: 1. Developing acute/subacute nonhemorrhagic infarct of posterior left insular cortex, superior left temporal, and left parietal lobe. 2. Moderate to severe focal stenosis within the more lateral left M2 branch with significant attenuation of distal MCA branch vessels corresponding to the infarct territory. 3. No other significant proximal stenosis, aneurysm, or branch vessel occlusion. These results were called by telephone at the time of interpretation on 05/26/2015 at 12:51 Pm to Dr. Aram Beecham, who verbally acknowledged these results. Electronically Signed   By: San Morelle M.D.   On: 05/26/2015 13:10   Mr Jodene Nam Head Wo Contrast  05/27/2015  CLINICAL  DATA:  Left MCA territory  infarct. Abnormal speech. Facial droop. The examination had to be discontinued prior to completion due to patient unable to tolerate despite medication. EXAM: MRI HEAD WITHOUT CONTRAST MRA HEAD WITHOUT CONTRAST TECHNIQUE: Multiplanar, multiecho pulse sequences of the brain and surrounding structures were obtained without intravenous contrast. Angiographic images of the head were obtained using MRA technique without contrast. COMPARISON:  CTA head and neck, CT perfusion, and CT head 05/26/2015 FINDINGS: MRI HEAD FINDINGS The diffusion-weighted images confirm an acute infarct involving the posterior aspect of the left MCA territory. The posterior tooth there is the left insular cortex are involved. There is involvement of the posterior left frontal lobe, left parietal lobe, in the superior left temporal lobe. There is significant patient motion which could cause some artifact on the susceptibility weighted imaging. However, petechial hemorrhages suspected within the left temporal and parietal lobe infarct. MRA HEAD FINDINGS The MRA is also distorted by patient motion. Internal carotid arteries are within normal limits from the high cervical segments through the ICA termini bilaterally. The A1 and M1 segments are intact. The MCA bifurcations are intact. There is significant attenuation of MCA branch vessels bilaterally, distorted by patient motion. The left vertebral artery is the dominant vessel. The PICA origins are visualized and normal bilaterally. The basilar artery is within normal limits. Both posterior cerebral arteries originate from basilar tip. The posterior cerebral arteries are within normal limits bilaterally. IMPRESSION: 1. Confirmation of an acute infarct involving the posterior left MCA territory. 2. Probable petechial hemorrhage within the infarct. 3. Significant attenuation of proximal MCA branch vessels bilaterally without a more proximal stenosis or occlusion. 4. The study  is moderately degraded by patient motion, limiting evaluation of medium and distal small vessels. Electronically Signed   By: San Morelle M.D.   On: 05/27/2015 12:03   Mr Brain Wo Contrast  05/27/2015  CLINICAL DATA:  Left MCA territory infarct. Abnormal speech. Facial droop. The examination had to be discontinued prior to completion due to patient unable to tolerate despite medication. EXAM: MRI HEAD WITHOUT CONTRAST MRA HEAD WITHOUT CONTRAST TECHNIQUE: Multiplanar, multiecho pulse sequences of the brain and surrounding structures were obtained without intravenous contrast. Angiographic images of the head were obtained using MRA technique without contrast. COMPARISON:  CTA head and neck, CT perfusion, and CT head 05/26/2015 FINDINGS: MRI HEAD FINDINGS The diffusion-weighted images confirm an acute infarct involving the posterior aspect of the left MCA territory. The posterior tooth there is the left insular cortex are involved. There is involvement of the posterior left frontal lobe, left parietal lobe, in the superior left temporal lobe. There is significant patient motion which could cause some artifact on the susceptibility weighted imaging. However, petechial hemorrhages suspected within the left temporal and parietal lobe infarct. MRA HEAD FINDINGS The MRA is also distorted by patient motion. Internal carotid arteries are within normal limits from the high cervical segments through the ICA termini bilaterally. The A1 and M1 segments are intact. The MCA bifurcations are intact. There is significant attenuation of MCA branch vessels bilaterally, distorted by patient motion. The left vertebral artery is the dominant vessel. The PICA origins are visualized and normal bilaterally. The basilar artery is within normal limits. Both posterior cerebral arteries originate from basilar tip. The posterior cerebral arteries are within normal limits bilaterally. IMPRESSION: 1. Confirmation of an acute infarct  involving the posterior left MCA territory. 2. Probable petechial hemorrhage within the infarct. 3. Significant attenuation of proximal MCA branch vessels bilaterally without a more proximal stenosis or occlusion.  4. The study is moderately degraded by patient motion, limiting evaluation of medium and distal small vessels. Electronically Signed   By: San Morelle M.D.   On: 05/27/2015 12:03   Ct Cerebral Perfusion W/cm  05/26/2015  ADDENDUM REPORT: 05/26/2015 13:11 ADDENDUM: These results were called by telephone at the time of interpretation on 05/26/2015 at 12:51 pm to Dr. Aram Beecham, who verbally acknowledged these results. Electronically Signed   By: San Morelle M.D.   On: 05/26/2015 13:11  05/26/2015  CLINICAL DATA:  Altered mental status.  Mental SP. EXAM: CT CEREBRAL PERFUSION WITH CONTRAST TECHNIQUE: Dynamic imaging was performed following the pulse injection of contrast. Para metric maps were subsequently calculated. CONTRAST:  43mL OMNIPAQUE IOHEXOL 350 MG/ML SOLN COMPARISON:  CT head without contrast from the same date. FINDINGS: Being time to transit is ext the and cerebral blood flow is diminished in the superior left temporal lobe and left parietal lobe within the posterior left MCA distribution. This involves posterior aspect of the left insular ribbon. There is less profound decrease in cerebral blood volume within the same territory. Less than 1/3 of the left MCA territory constitutes core infarct. Parameters are within normal limits in the right hemisphere IMPRESSION: 1. Large area of ischemia involving the posterior aspect of the left MCA territory. This involves the posterior left insular ribbon, superior left temporal lobe, and left parietal white. 2. More subtle cerebral blood volume changes with the area of core infarct constituting less than 1/3 of the left MCA territory. Electronically Signed: By: San Morelle M.D. On: 05/26/2015 12:51    Wt Readings from Last  3 Encounters:  05/26/15 239 lb 6.7 oz (108.6 kg)  04/22/15 244 lb 4.8 oz (110.814 kg)  11/21/14 231 lb (104.781 kg)    EKG: 12/17: NSR 73bpm no acute changes (nonspecific change in III) 12/18: NSR 83bpm no acute changes (nonspecific change in II, III, avF) 12/19: NSR 72bpm no acute changes (nonspecific change in III, avF)  Physical Exam: Blood pressure 149/72, pulse 68, temperature 98 F (36.7 C), temperature source Oral, resp. rate 15, height 4\' 11"  (1.499 m), weight 239 lb 6.7 oz (108.6 kg), last menstrual period 05/24/2003, SpO2 100 %. General: Well developed, well nourished AAF in no acute distress. Head: Normocephalic, atraumatic, sclera non-icteric, no xanthomas, nares are without discharge.  Neck: Negative for carotid bruits. JVD not elevated. Lungs: Clear bilaterally to auscultation without wheezes, rales, or rhonchi. Breathing is unlabored. Heart: RRR with S1 S2. No murmurs, rubs, or gallops appreciated. Abdomen: Soft, non-tender, non-distended with normoactive bowel sounds. No hepatomegaly. No rebound/guarding. No obvious abdominal masses. Msk:  Strength and tone appear normal for age. Extremities: No clubbing or cyanosis. No edema.  Distal pedal pulses are 2+ and equal bilaterally. Neuro: Severe expressive aphasia. Follows simple commands but does so slowly.   Assessment and Plan:   1. Acute stroke - occurred in the setting of subtherapeutic INR (1.6) and also moderate-severe cerebrovascular disease in this territory by imaging. Per notes, the patient has been on heparin per pharmacy as well as ASA 300 PR since admission - Coumadin on hold due to NPO status. Neurology's last note recommended to consider NOAC but this is being deferred until it is confirmed that she can take oral medications. Teaching service has requested TEE, but I am not sure this is clinically indicated at present time. She already has a lifelong reason for anticoagulation (h/o unprovoked DVT/bilateral PE) and  came in subtherapeutic this admission. I will review with  MD. Agree that NOAC may be a good option once cleared from neurologic perspective.  2. Chest pain with elevated troponin - unclear if this represents elevated value in the setting of stroke versus an ACS. It is impossible to elicit an adequate history of her chest pain, although she does have significant tenderness to palpation which would indicate a MSK etiology. EKG is nonspecific. No RWMA by echo. She does not exhibit any signs of acute PE including tachypnea, dyspnea, hypoxia, or tachycardia. She is currently being covered with aspirin and heparin. Her aphasia and acute stroke make her a poor candidate for cath at present time. Will discuss possibility of noninvasive testing versus conservative management with MD. Will also repeat troponin later today to trend.  3. HTN - being managed by IM/neuro in the context of permissive HTN.  4. Hyperkalemia then hypokalemia - per IM.  5. Mild AS by echo - follow clinically for now.  Signed, Charlie Pitter PA-C 05/28/2015, 12:27 PM Pager: [phone]  The patient was seen.  She has severe expressive aphasia.  She does indicate some discomfort in her upper chest.  She is tender to palpation in that area.  Her electrocardiogram was reviewed personally.  It does not show any evidence of ischemia or infarction.  Her echocardiogram shows normal left ventricular systolic function without wall motion abnormalities.  The right atrium and left atrial sizes are normal.  I suspect that her elevated troponin is secondary to her acute stroke.  I doubt acute coronary syndrome or non-STEMI.  We will continue to follow enzyme pattern.  At this point there is not a clear indication to proceed with TEE since she does need to be on lifelong anticoagulation for pulmonary indications of previous unprovoked DVT and bilateral pulmonary emboli.  There is nothing in the history to suggest endocarditis.  I would agree that switching  her to a NOAC would be appropriate and should be done when cleared by neurology.  Apixaban would be a good choice.  In terms of her elevated troponin, would follow with conservative management at this point.  Could consider outpatient Lexiscan Myoview at a later date depending on how she does with her stroke.

## 2015-05-29 DIAGNOSIS — R079 Chest pain, unspecified: Secondary | ICD-10-CM

## 2015-05-29 DIAGNOSIS — I69320 Aphasia following cerebral infarction: Secondary | ICD-10-CM | POA: Insufficient documentation

## 2015-05-29 DIAGNOSIS — G4733 Obstructive sleep apnea (adult) (pediatric): Secondary | ICD-10-CM | POA: Insufficient documentation

## 2015-05-29 DIAGNOSIS — I639 Cerebral infarction, unspecified: Secondary | ICD-10-CM

## 2015-05-29 DIAGNOSIS — R7989 Other specified abnormal findings of blood chemistry: Secondary | ICD-10-CM

## 2015-05-29 DIAGNOSIS — Z8669 Personal history of other diseases of the nervous system and sense organs: Secondary | ICD-10-CM | POA: Insufficient documentation

## 2015-05-29 DIAGNOSIS — R4702 Dysphasia: Secondary | ICD-10-CM

## 2015-05-29 DIAGNOSIS — I10 Essential (primary) hypertension: Secondary | ICD-10-CM

## 2015-05-29 DIAGNOSIS — I69391 Dysphagia following cerebral infarction: Secondary | ICD-10-CM

## 2015-05-29 DIAGNOSIS — J449 Chronic obstructive pulmonary disease, unspecified: Secondary | ICD-10-CM | POA: Insufficient documentation

## 2015-05-29 DIAGNOSIS — I69359 Hemiplegia and hemiparesis following cerebral infarction affecting unspecified side: Secondary | ICD-10-CM

## 2015-05-29 DIAGNOSIS — Z86718 Personal history of other venous thrombosis and embolism: Secondary | ICD-10-CM

## 2015-05-29 DIAGNOSIS — Z86711 Personal history of pulmonary embolism: Secondary | ICD-10-CM

## 2015-05-29 LAB — CBC
HEMATOCRIT: 38.7 % (ref 36.0–46.0)
Hemoglobin: 12.8 g/dL (ref 12.0–15.0)
MCH: 32.5 pg (ref 26.0–34.0)
MCHC: 33.1 g/dL (ref 30.0–36.0)
MCV: 98.2 fL (ref 78.0–100.0)
PLATELETS: 164 10*3/uL (ref 150–400)
RBC: 3.94 MIL/uL (ref 3.87–5.11)
RDW: 13.7 % (ref 11.5–15.5)
WBC: 5.7 10*3/uL (ref 4.0–10.5)

## 2015-05-29 LAB — BASIC METABOLIC PANEL
ANION GAP: 7 (ref 5–15)
BUN: <5 mg/dL — ABNORMAL LOW (ref 6–20)
CALCIUM: 8.6 mg/dL — AB (ref 8.9–10.3)
CO2: 27 mmol/L (ref 22–32)
Chloride: 103 mmol/L (ref 101–111)
Creatinine, Ser: 0.57 mg/dL (ref 0.44–1.00)
GLUCOSE: 114 mg/dL — AB (ref 65–99)
POTASSIUM: 3.6 mmol/L (ref 3.5–5.1)
Sodium: 137 mmol/L (ref 135–145)

## 2015-05-29 LAB — TROPONIN I
Troponin I: 0.23 ng/mL — ABNORMAL HIGH (ref <0.031)
Troponin I: 0.18 ng/mL — ABNORMAL HIGH (ref <0.031)

## 2015-05-29 MED ORDER — RIVAROXABAN 20 MG PO TABS
20.0000 mg | ORAL_TABLET | Freq: Every day | ORAL | Status: DC
  Administered 2015-05-30: 20 mg via ORAL
  Filled 2015-05-29: qty 1

## 2015-05-29 NOTE — Progress Notes (Signed)
Speech Language Pathology Treatment: Dysphagia;Cognitive-Linquistic  Patient Details [name] MRN: [id number] DOB: November 30, 1955 Today's Date: 05/29/2015 Time: 1340-1420 SLP Time Calculation (min) (ACUTE ONLY): 40 min  Assessment / Plan / Recommendation Clinical Impression  Pt seen for second session given concerns with safety with diet in initial session today. Daughter present in pm, helpful in cueing pt. Pt much more willing to drink ginger ale than water, gave herself multiple very small sips without any sign of aspiration. Daughter reports this was her method of drinking prior to CVA. Pt has refused meals with puree texture, so SLP attempted trials of upgraded solids with very poor result. Pt could not orally manipulate bolus or follow verbal cues to masticate on the left. Had to utilize liquid wash to transit which did elicit coughing and probable penetration event. Pt is recommended to continue a puree diet with addition of Magic cup and dietary supplements if she refuses puree. SLP also provided max verbal cueing up to total assist for pt to point to pictures and letters on a communication board. For now, this is not a functional strategy for basic communication. Suspect pt may have to difficulty recognizing letters and also some difficulty with the right visual field. Will continue efforts.    HPI HPI: 59 y.o. female brought in with acute receptive>>expressive aphasia. Woke up with stroke, with CT already showing question of early ischemia in distribution of the left middle cerebral artery.       SLP Plan  MBS     Recommendations  Diet recommendations: Dysphagia 1 (puree);Thin liquid Liquids provided via: Cup Medication Administration: Crushed with puree Supervision: Staff to assist with self feeding Compensations: Minimize environmental distractions;Slow rate;Small sips/bites;Lingual sweep for clearance of pocketing Postural Changes and/or Swallow Maneuvers: Seated upright  90 degrees              Oral Care Recommendations: Oral care BID Follow up Recommendations: Inpatient Rehab Plan: MBS  Herbie [place], [place] CCC-SLP [id number]  Lynann Beaver 05/29/2015, 2:37 PM

## 2015-05-29 NOTE — Care Management Important Message (Signed)
Important Message  Patient Details  [name] MRN: [id number] Date of Birth: 1956-05-03   Medicare Important Message Given:  Yes    Willmer Fellers P [place] 05/29/2015, 1:01 PM

## 2015-05-29 NOTE — Progress Notes (Signed)
Rehab Admissions Coordinator Note:  Patient was screened by Retta Diones for appropriateness for an Inpatient Acute Rehab Consult.  At this time, we are recommending Inpatient Rehab consult.  Retta Diones 05/29/2015, 10:58 AM  I can be reached at [phone].

## 2015-05-29 NOTE — Progress Notes (Signed)
STROKE TEAM PROGRESS NOTE   HISTORY Brooke [name] is an 59 y.o. female with a past medical history significant for HTN, HLD, migraine, morbid obesity, colon cancer s/p subtotal colectomy, DVT on coumadin, COPD, and anxiety, brought in by EMS due to new onset aphasia. Family reports that she was normal around 11 pm last night and approximately at 8 am this morning was found by her granddaughter sitting in a chair " looking confused and unable to speak". EMS was summoned and upon arrival to the ED was confirmed to have receptive>expressive aphasia but no focal weakness. CT brain was personally reviewed and there is question early ischemia in distribution of the left middle cerebral artery.  INR 1.61 Presently, she is alert and awake but with global aphasia that is receptive>>expressive. Of note, patient family member reports that she was using an inhaler that has Fentanyl on it.  Date last known well: 05/25/15 Time last known well: 11 pm tPA Given: no, out of the window     SUBJECTIVE (INTERVAL HISTORY) No  family members present. The patient is aphasic,   and had difficulty following commands.  She has been seen by cardiology who feels elevated troponins are related to the stroke and not due to myocardial infarction. Patient has been felt to be requiring long-term anticoagulation for unprovoked pulmonary emboli hence TEE is not felt necessary  OBJECTIVE Temp:  [97.7 F (36.5 C)-98.7 F (37.1 C)] 98.7 F (37.1 C) (12/20 1311) Pulse Rate:  [69-78] 69 (12/20 1311) Cardiac Rhythm:  [-] Normal sinus rhythm (12/20 0700) Resp:  [16-20] 20 (12/20 1311) BP: (135-153)/(64-83) 152/83 mmHg (12/20 1311) SpO2:  [98 %-100 %] 99 % (12/20 1311)  CBC:  Recent Labs Lab 05/26/15 0929  05/28/15 0603 05/29/15 0528  WBC 6.5  < > 6.3 5.7  NEUTROABS 3.4  --   --   --   HGB 15.2*  < > 13.2 12.8  HCT 47.7*  < > 40.7 38.7  MCV 100.6*  < > 98.1 98.2  PLT 215  < > 191 164  < > = values in this  interval not displayed.  Basic Metabolic Panel:   Recent Labs Lab 05/28/15 0603 05/28/15 1041 05/29/15 0528  NA 137  --  137  K 3.4*  --  3.6  CL 103  --  103  CO2 25  --  27  GLUCOSE 98  --  114*  BUN <5*  --  <5*  CREATININE 0.64  --  0.57  CALCIUM 8.4*  --  8.6*  MG  --  2.0  --     Lipid Panel:     Component Value Date/Time   CHOL 139 05/27/2015 0110   TRIG 62 05/27/2015 0110   HDL 54 05/27/2015 0110   CHOLHDL 2.6 05/27/2015 0110   VLDL 12 05/27/2015 0110   LDLCALC 73 05/27/2015 0110   HgbA1c:  Lab Results  Component Value Date   HGBA1C 5.6 05/26/2015   Urine Drug Screen:     Component Value Date/Time   LABOPIA NONE DETECTED 05/26/2015 1320   COCAINSCRNUR NONE DETECTED 05/26/2015 1320   LABBENZ NONE DETECTED 05/26/2015 1320   AMPHETMU NONE DETECTED 05/26/2015 1320   THCU NONE DETECTED 05/26/2015 1320   LABBARB NONE DETECTED 05/26/2015 1320      IMAGING  Ct Angio Head and Neck W/cm &/or Wo Cm 05/26/2015   1. Developing acute/subacute nonhemorrhagic infarct of posterior left insular cortex, superior left temporal, and left parietal lobe.  2.  Moderate to severe focal stenosis within the more lateral left M2 branch with significant attenuation of distal MCA branch vessels corresponding to the infarct territory.  3. No other significant proximal stenosis, aneurysm, or branch vessel occlusion.     Ct Head Wo Contrast 05/26/2015   Question early ischemia in distribution of the left middle cerebral artery. No other focal abnormality is noted.      Ct Cerebral Perfusion W/cm 05/26/2015   1. Large area of ischemia involving the posterior aspect of the left MCA territory. This involves the posterior left insular ribbon, superior left temporal lobe, and left parietal white.  2. More subtle cerebral blood volume changes with the area of core infarct constituting less than 1/3 of the left MCA territory.    MRI / MRA head 05/27/2015 1. Confirmation of an  acute infarct involving the posterior left MCA territory. 2. Probable petechial hemorrhage within the infarct. 3. Significant attenuation of proximal MCA branch vessels bilaterally without a more proximal stenosis or occlusion. 4. The study is moderately degraded by patient motion, limiting evaluation of medium and distal small vessels.   PHYSICAL EXAM   Obese middle-aged African-American lady not in distress. . Afebrile. Head is nontraumatic. Neck is supple without bruit.    Cardiac exam no murmur or gallop. Lungs are clear to auscultation. Distal pulses are well felt. Mental Status: The patient is aphasic. She was not able to follow commands at this time.  Cranial Nerves: II: Discs not visualized; Visual fields could not be tested. III,IV, VI: ptosis not present, question of mild left gaze preference V,VII: The patient is not following commands adequately tested for facial asymmetry. VIII: hearing appears normal. She does respond to voice IX,X: uvula could not be tested XI: bilateral shoulder shrug unable to test XII: Unable to follow commands to extend tongue Motor: The patient is moving all 4 extremities spontaneously Sensory: reacts to noxious stimuli Cerebellar: Unable to test  Gait:  Not attempted at this time   ASSESSMENT/PLAN Brooke [name] is a 59 y.o. female with history of hypertension, asthma, COPD, dyslipidemia, colon cancer, migraine headaches, obesity, obstructive sleep apnea, and DVT history  presenting with aphasia. She did not receive IV t-PA due to late presentation.  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  Resultant  aphasia  MRI  large left middle cerebral artery territory infarct.  MRA  Moderate to severe focal stenosis within the more lateral left M2 branch  Carotid Doppler  pending  2D Echo EF 50-55%. No cardiac source of emboli identified.  LDL 73  HgbA1c 5.6  VTE prophylaxis - IV heparin DIET - DYS 1 Room service  appropriate?: Yes; Fluid consistency:: Thin  warfarin daily prior to admission, now on aspirin 300 mg suppository daily and heparin IV  Patient counseled to be compliant with her antithrombotic medications  Ongoing aggressive stroke risk factor management  Therapy recommendations:  CLR  Disposition: Pending  Hypertension  Stable  Permissive hypertension (OK if < 220/120) but gradually normalize in 5-7 days  Hyperlipidemia  Home meds:  No lipid lowering medications prior to admission.  LDL 73, goal < 70  Now on Lipitor 40 mg daily  Continue statin at discharge    Other Stroke Risk Factors  Cigarette smoker, Will be advised to stop smoking  Obesity, Body mass index is 48.33 kg/(m^2).   Family hx stroke (father)  Migraines  Obstructive sleep apnea  Other Active Problems    Hospital day # 3  I have personally examined this patient, reviewed notes, independently viewed imaging studies, participated in medical decision making and plan of care. I have made any additions or clarifications directly to the above note.    Patient has been determined to need   long-term anticoagulation given history of pulmonary embolism. Cardiology field TEE is not necessary. Agree with changing to new oral anticoagulant for long-term therapy. Transfer to rehabilitation over the next few days. Stroke team will sign off. Follow-up in the stroke clinic in 2 months. Kindly call for questions.  Larue Lightner   To contact Stroke Continuity provider, please refer to [email or web address] After hours, contact General Neurology

## 2015-05-29 NOTE — Progress Notes (Signed)
Occupational Therapy Evaluation Patient Details [name] MRN: [id number] DOB: Jul 21, 1955 Today's Date: 05/29/2015    History of Present Illness 59 y/o BF admitted with moderately sized left MCA infarct. PMH of DVT, COPD, anxiety, chronic pain syndrome   Clinical Impression   Unsure of pt's PLOF - no family present during session. Pt currently presents with R side hemiparesis, R inattention, receptive/expressive aphasia, impulsive behavior, and decreased safety and deficit awareness. Pt will benefit from acute skilled OT to increase independence and safety with ADLs and mobility to allow safe discharge to venue listed below. Recommending CIR for post-acute rehab.     Follow Up Recommendations  CIR    Equipment Recommendations  Other (comment) (TBD in next venue)    Recommendations for Other Services Rehab consult     Precautions / Restrictions Precautions Precautions: Fall Precaution Comments: R sided neglect Restrictions Weight Bearing Restrictions: No      Mobility Bed Mobility Overal bed mobility: Needs Assistance Bed Mobility: Supine to Sit;Sit to Supine     Supine to sit: Min assist Sit to supine: Min assist   General bed mobility comments: Min assist (hand held assist) for boost to come to sitting and for safety due to pt's impulsivity. HOB slightly elevated.  Transfers Overall transfer level: Needs assistance Equipment used: Rolling walker (2 wheeled);1 person hand held assist Transfers: Sit to/from Stand Sit to Stand: Min assist;+2 safety/equipment Stand pivot transfers: Min assist;+2 safety/equipment       General transfer comment: Min assist +2 for safety due to pt's severe impulsivity, unsteadiness and R inattention. Pt unable to grip RW with R hand without assist and ambulated better without AD.     Balance Overall balance assessment: Needs assistance Sitting-balance support: No upper extremity supported;Feet supported Sitting  balance-Leahy Scale: Fair     Standing balance support: Single extremity supported;During functional activity Standing balance-Leahy Scale: Poor Standing balance comment: 1 UE assist to stand and complete pericare                            ADL Overall ADL's : Needs assistance/impaired     Grooming: Wash/dry face;Set up;Cueing for sequencing;Sitting       Lower Body Bathing: Minimal assistance;+2 for safety/equipment;Cueing for sequencing;Sit to/from stand   Upper Body Dressing : Set up;Cueing for sequencing;Sitting       Toilet Transfer: Minimal assistance;+2 for safety/equipment;Ambulation;BSC;Cueing for sequencing;Cueing for safety   Toileting- Clothing Manipulation and Hygiene: Minimal assistance;Cueing for safety;Cueing for sequencing;Sit to/from stand       Functional mobility during ADLs: Minimal assistance;+2 for safety/equipment;Rolling walker General ADL Comments: Pt very impulsive and with decreased attention. Mod verbal cues to initiate movement and to maintain safety while ambulating and completing ADLs. Pt spontaneously attempting to use RUE to reach for items and to wave "goodbye" but had increased difficulty when asked to use RUE. At end of session, pt attempted to get out of chair and climb into the bed despite max verbal and tactile cues to stop and wait for assistance.       Vision Additional Comments: Unsure of pt's visual hx/baseline. Pt unable to follow commands to complete screening   Perception Perception Perception Tested?: Yes Perception Deficits: Inattention/neglect Inattention/Neglect: Does not attend to right side of body   Praxis      Pertinent Vitals/Pain Pain Assessment: Faces Faces Pain Scale: Hurts even more Pain Location: eyes with light Pain Intervention(s): Monitored during session  Hand Dominance Right   Extremity/Trunk Assessment Upper Extremity Assessment Upper Extremity Assessment: RUE deficits/detail;LUE  deficits/detail RUE Deficits / Details: all 2/5; pt reports numbness/tingling - Difficult to assess due to pt's aphasia, inability to follow commands consistently and attention deficits.  RUE Sensation: decreased light touch;decreased proprioception RUE Coordination: decreased fine motor;decreased gross motor LUE Deficits / Details: all 3/5 - difficult to assess due to pt's inability to follow commands consistently and attention deficits LUE Coordination: decreased gross motor;decreased fine motor   Lower Extremity Assessment Lower Extremity Assessment: Defer to PT evaluation   Cervical / Trunk Assessment Cervical / Trunk Assessment: Normal   Communication Communication Communication: Receptive difficulties;Expressive difficulties   Cognition Arousal/Alertness: Awake/alert Behavior During Therapy: Impulsive Overall Cognitive Status: Impaired/Different from baseline Area of Impairment: Following commands;Awareness;Safety/judgement;Problem solving;Attention   Current Attention Level: Sustained Memory: Decreased recall of precautions Following Commands: Follows one step commands inconsistently Safety/Judgement: Decreased awareness of safety;Decreased awareness of deficits Awareness: Intellectual Problem Solving: Requires verbal cues;Requires tactile cues;Decreased initiation;Slow processing     General Comments       Exercises       Shoulder Instructions      Home Living Family/patient expects to be discharged to:: Private residence Living Arrangements: Children                               Additional Comments: Unsure of PLOF - no family present. Lives with daughter      Prior Functioning/Environment               OT Diagnosis: Generalized weakness;Cognitive deficits;Disturbance of vision;Acute pain;Hemiplegia dominant side   OT Problem List: Decreased strength;Decreased range of motion;Decreased activity tolerance;Impaired balance (sitting and/or  standing);Decreased coordination;Impaired vision/perception;Decreased cognition;Decreased safety awareness;Decreased knowledge of use of DME or AE;Decreased knowledge of precautions;Impaired sensation;Impaired tone;Obesity;Impaired UE functional use;Pain   OT Treatment/Interventions: Self-care/ADL training;Therapeutic exercise;Energy conservation;DME and/or AE instruction;Therapeutic activities;Cognitive remediation/compensation;Visual/perceptual remediation/compensation;Patient/family education;Balance training    OT Goals(Current goals can be found in the care plan section) Acute Rehab OT Goals Patient Stated Goal: Unable to state OT Goal Formulation: With patient Time For Goal Achievement: 06/12/15 Potential to Achieve Goals: Good ADL Goals Pt Will Perform Grooming: sitting;with modified independence (incorporating use of RUE with min verbal cues) Pt Will Perform Upper Body Bathing: sitting;with modified independence (incorporating use of RUE with min verbal cues) Pt Will Perform Lower Body Bathing: sit to/from stand;with modified independence (incorporating use of RUE with min verbal cues) Pt Will Transfer to Toilet: with modified independence;ambulating;bedside commode Pt Will Perform Toileting - Clothing Manipulation and hygiene: with modified independence;sitting/lateral leans;sit to/from stand  OT Frequency: Min 3X/week   Barriers to D/C:            Co-evaluation PT/OT/SLP Co-Evaluation/Treatment: Yes Reason for Co-Treatment: Necessary to address cognition/behavior during functional activity;For patient/therapist safety   OT goals addressed during session: ADL's and self-care;Strengthening/ROM      End of Session Equipment Utilized During Treatment: Gait belt;Rolling walker Nurse Communication: Mobility status;Other (comment);Precautions (Very impulsive, pt hungry)  Activity Tolerance: Patient tolerated treatment well Patient left: in bed;with call bell/phone within  reach;with bed alarm set;with nursing/sitter in room   Time: 0933-1006 OT Time Calculation (min): 33 min Charges:  OT General Charges $OT Visit: 1 Procedure OT Evaluation $Initial OT Evaluation Tier I: 1 Procedure OT Treatments $Self Care/Home Management : 8-22 mins G-Codes:    Redmond Baseman, OTR/L 06-01-15, 11:04 AM

## 2015-05-29 NOTE — Progress Notes (Signed)
Patient ID: TASHYRA CIPOLLONE, female   DOB: 1956/03/19, 59 y.o.   MRN: [id number] Medicine attending: I personally examined this patient this morning together with resident physician Dr. Shela Leff and I concur with her evaluation and management plan which we discussed together. The patient is much more interactive today. Still with a significant expressive aphasia and unable to phonate. She continues to have some chest discomfort but cardiogram is normal, and cardiology consultant feels that the elevated troponins are related to the stroke and not  non-ST wave myocardial injury. He also does not feel that this lady needs to have a TEE. She is status post unprovoked bilateral pulmonary emboli and extensive right lower extremity in October 2015 and therefore will be on chronic anticoagulation long-term. No further evaluation recommended at this time. She was formerly on warfarin. There is no reason why she cannot be transitioned to a new direct acting oral anticoagulants such as Xarelto for her convenience. In fact, a professional society now recommends the second-generation drugs for patients who need to be on long-term anticoagulation since they have a better risk-benefit ratio with respect to bleeding then warfarin. We will make this change today. She is coming off full dose unfractionated heparin infusion so she does not need a loading dose of the Xarelto. Cardiac issues addressed, we will now involved physical therapy and occupational therapy and begin the rehabilitation process.

## 2015-05-29 NOTE — PMR Pre-admission (Signed)
PMR Admission Coordinator Pre-Admission Assessment  Patient: Brooke [name] is an 59 y.o., female MRN: [id number] DOB: 12-06-1955 Height: 4' 11"  (149.9 cm) Weight: 108.6 kg (239 lb 6.7 oz)              Insurance Information HMO: Yes    PPO:       PCP:       IPA:       80/20:       OTHER:  Group #[id number] PRIMARY: [employer]      Policy#: [id number]      Subscriber: Karel Jarvis CM Name: Silvio Pate      Phone#: [phone]     Fax#: [phone] Pre-Cert#: [id number] with update due after team conference and every 7 days      Employer: Disabled/Not working Benefits:  Phone #: [phone]     Name: Automated Eff. Date: 06/09/14     Deduct: $0      Out of Pocket Max: $5500 (met $1177.30)      Life Max: unlimited CIR: $275 days 1-7      SNF: $0 days 1-20; $160 days days 21-100 Outpatient: medical necessity     Co-Pay:  $40 copay Home Health: 100%     Co-Pay: none DME: 80%     Co-Pay: 20% Providers: in network  Medicaid Application Date:        Case Manager:   Disability Application Date:        Case Worker:    Emergency Facilities manager Information    Name Relation Home Work Mobile   [place] Daughter   [phone]     Current Medical History  Patient Admitting Diagnosis:  L MCA infarct  History of Present Illness: A 59 y.o. female with history of HTN, COPD, Colon CA, Migraines, OSA, morbid obesity, DVT/PE on coumadin who was admitted on 05/26/15 after being found by family with confusion, lethargy and difficulty speaking on 05/26/15. History taken from chart review. Last seen normal 11 pm the night before incident. CT brain with question of early ischemia in L-MCA distribution and INR- 1.61. CTA with confirmed large area of ischemia involving posterior left insular ribbon, superior left temporal lobe, and left parietal white. MRI/MRA brain revealed large L-MCA territory infarct with moderate to severe focal stenosis within more lateral L-M2 branch. She was  started on IV heparin for treatment. 2 D echo with EF 50-55%, mild aortic stenosis, trivial MR and no wall abnormality. She developed chest pain with elevated troponin and cardiology consulted for input. Dr. Mare Ferrari felt elevated troponin could bed due to stroke v/s ACS and CP appeared musculoskeletal in nature per exam-doubt PE. Patient not a candidate for cardiac cath and to consider switching to NOAC as patient on lifelong anticoagulation. MBS done to evaluate dysphagia and patient started on dysphagia 1, thin liquids.     Total: 27=NIH  Past Medical History  Past Medical History  Diagnosis Date  . Hypertension   . Asthma   . COPD (chronic obstructive pulmonary disease) ([place])   . Anxiety   . Colon cancer ([place])     a. s/p surgery, chemotherapy.  . Dyslipidemia   . Migraine headache   . Chronic bronchitis   . Uterine fibroid   . Ovarian cyst   . GERD (gastroesophageal reflux disease)   . Morbid obesity ([place])   . Obstructive sleep apnea     mild  . Chronic pain   . Chronic back pain   . Arthritis   .  Osteoarthritis   . Depression   . DVT (deep venous thrombosis) ([place]) 02/2014  . 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 PCCM recommended lifelong anticoagulation.  . Tobacco abuse   . PVC's (premature ventricular contractions) 2013  . Aortic stenosis     a. mild by echo 05/2015.    Family History  family history includes Asthma in her child and child; Breast cancer in her maternal grandmother; Colon cancer in her maternal uncle; Diabetes in her father and sister; Hypertension in her father; Ovarian cancer in her mother; Stroke in her father; Uterine cancer in her mother.  Prior Rehab/Hospitalizations: No previous rehab admissions.  Has the patient had major surgery during 100 days prior to admission? No  Current Medications   Current facility-administered medications:  .  acetaminophen (TYLENOL) suppository 650 mg, 650 mg, Rectal, Q8H PRN, Shela Leff, MD .   amitriptyline (ELAVIL) tablet 50 mg, 50 mg, Oral, QHS, Alexa Sherral Hammers, MD .  antiseptic oral rinse (CPC / CETYLPYRIDINIUM CHLORIDE 0.05%) solution 7 mL, 7 mL, Mouth Rinse, q12n4p, Oval Linsey, MD, 7 mL at 05/29/15 2244 .  atorvastatin (LIPITOR) tablet 40 mg, 40 mg, Oral, q1800, Vickii Chafe, MD, 40 mg at 05/28/15 2101 .  chlorhexidine (PERIDEX) 0.12 % solution 15 mL, 15 mL, Mouth Rinse, BID, Oval Linsey, MD, 15 mL at 05/29/15 2244 .  citalopram (CELEXA) tablet 20 mg, 20 mg, Oral, Daily, Alexa Sherral Hammers, MD .  gabapentin (NEURONTIN) capsule 300 mg, 300 mg, Oral, QHS, Alexa Sherral Hammers, MD .  ipratropium-albuterol (DUONEB) 0.5-2.5 (3) MG/3ML nebulizer solution 3 mL, 3 mL, Nebulization, Q6H PRN, Vickii Chafe, MD, 3 mL at 05/29/15 0841 .  LORazepam (ATIVAN) tablet 1 mg, 1 mg, Oral, BID PRN, Alexa Sherral Hammers, MD .  ondansetron (ZOFRAN) tablet 4 mg, 4 mg, Oral, Q6H PRN **OR** ondansetron (ZOFRAN) injection 4 mg, 4 mg, Intravenous, Q6H PRN, Vickii Chafe, MD, 4 mg at 05/28/15 0636 .  oxyCODONE-acetaminophen (PERCOCET) 7.5-325 MG per tablet 1 tablet, 1 tablet, Oral, Q6H PRN, Alexa Sherral Hammers, MD .  pantoprazole (PROTONIX) EC tablet 40 mg, 40 mg, Oral, Daily, Alexa Sherral Hammers, MD .  potassium chloride SA (K-DUR,KLOR-CON) CR tablet 20 mEq, 20 mEq, Oral, Daily, Alexa Sherral Hammers, MD .  rivaroxaban (XARELTO) tablet 20 mg, 20 mg, Oral, Q supper, Iline Oven, MD .  sodium chloride 0.9 % injection 10-40 mL, 10-40 mL, Intracatheter, Q12H, Jola Schmidt, MD, 20 mL at 05/28/15 1000 .  sodium chloride 0.9 % injection 10-40 mL, 10-40 mL, Intracatheter, PRN, Jola Schmidt, MD, 10 mL at 05/28/15 2259 .  sodium chloride 0.9 % injection 10-40 mL, 10-40 mL, Intracatheter, PRN, Vickii Chafe, MD, 10 mL at 05/29/15 2225 .  sodium chloride 0.9 % injection 3 mL, 3 mL, Intravenous, Q12H, Alexa Sherral Hammers, MD, 3 mL at 05/29/15 2248  Patients Current Diet: DIET - DYS 1 Room  service appropriate?: Yes; Fluid consistency:: Thin  Precautions / Restrictions Precautions Precautions: Fall Precaution Comments: R sided neglect Restrictions Weight Bearing Restrictions: No   Has the patient had 2 or more falls or a fall with injury in the past year?Yes.  2 falls in the last year resulting in "soreness".  Prior Activity Level Community (5-7x/wk): Went out daily.  Picked up grandchildren from school.  Home Assistive Devices / Equipment Home Assistive Devices/Equipment: Kasandra Knudsen (specify quad or straight)  Prior Device Use: Indicate devices/aids used by the patient prior to current illness, exacerbation or injury?  Straight cane as needed.  Prior Functional Level Independent and driving.  Used [place] but only as needed.  Self Care: Did the patient need help bathing, dressing, using the toilet or eating?  Independent  Indoor Mobility: Did the patient need assistance with walking from room to room (with or without device)? Independent  Stairs: Did the patient need assistance with internal or external stairs (with or without device)? Independent  Functional Cognition: Did the patient need help planning regular tasks such as shopping or remembering to take medications? Independent  Current Functional Level Cognition  Arousal/Alertness: Lethargic Overall Cognitive Status: Impaired/Different from baseline Difficult to assess due to: Impaired communication Current Attention Level: Sustained Orientation Level: Oriented to person, Oriented to place, Other (comment) (pt asleep but arousable  ) Following Commands: Follows one step commands inconsistently Safety/Judgement: Decreased awareness of safety, Decreased awareness of deficits Attention: Focused, Sustained Focused Attention: Appears intact Sustained Attention: Impaired Sustained Attention Impairment: Verbal complex, Functional complex Memory: Appears intact Awareness: Impaired Awareness Impairment: Intellectual  impairment, Emergent impairment, Anticipatory impairment Problem Solving: Appears intact Behaviors: Restless, Impulsive, Perseveration, Poor frustration tolerance Safety/Judgment: Impaired    Extremity Assessment (includes Sensation/Coordination)  Upper Extremity Assessment: RUE deficits/detail, LUE deficits/detail RUE Deficits / Details: all 2/5; pt reports numbness/tingling - Difficult to assess due to pt's aphasia, inability to follow commands consistently and attention deficits.  RUE Sensation: decreased light touch, decreased proprioception RUE Coordination: decreased fine motor, decreased gross motor LUE Deficits / Details: all 3/5 - difficult to assess due to pt's inability to follow commands consistently and attention deficits LUE Coordination: decreased gross motor, decreased fine motor  Lower Extremity Assessment: Defer to PT evaluation    ADLs  Overall ADL's : Needs assistance/impaired Grooming: Wash/dry face, Set up, Cueing for sequencing, Sitting Lower Body Bathing: Minimal assistance, +2 for safety/equipment, Cueing for sequencing, Sit to/from stand Upper Body Dressing : Set up, Cueing for sequencing, Sitting Toilet Transfer: Minimal assistance, +2 for safety/equipment, Ambulation, BSC, Cueing for sequencing, Cueing for safety Toileting- Clothing Manipulation and Hygiene: Minimal assistance, Cueing for safety, Cueing for sequencing, Sit to/from stand Functional mobility during ADLs: Minimal assistance, +2 for safety/equipment, Rolling walker General ADL Comments: Pt very impulsive and with decreased attention. Mod verbal cues to initiate movement and to maintain safety while ambulating and completing ADLs. Pt spontaneously attempting to use RUE to reach for items and to wave "goodbye" but had increased difficulty when asked to use RUE. At end of session, pt attempted to get out of chair and climb into the bed despite max verbal and tactile cues to stop and wait for assistance.       Mobility  Overal bed mobility: Needs Assistance Bed Mobility: Supine to Sit, Sit to Supine Supine to sit: Min assist Sit to supine: Min assist General bed mobility comments: Min assist (hand held assist) for boost to come to sitting and for safety due to pt's impulsivity. HOB slightly elevated.    Transfers  Overall transfer level: Needs assistance Equipment used: Rolling walker (2 wheeled), 1 person hand held assist Transfers: Sit to/from Stand Sit to Stand: Min assist, +2 safety/equipment Stand pivot transfers: Min assist, +2 safety/equipment General transfer comment: Min assist +2 for safety due to pt's severe impulsivity, unsteadiness and R inattention. Pt unable to grip RW with R hand without assist and ambulated better without AD.     Ambulation / Gait / Stairs / Wheelchair Mobility  Ambulation/Gait Ambulation/Gait assistance: Museum/gallery curator (Feet): 20 Feet Assistive device: 1 person  hand held assist, Rolling walker (2 wheeled) Gait Pattern/deviations: Step-through pattern General Gait Details: AMb to doorway with RW with inability to maintain R UE grip on RW.  At doorway, pt abruptly stopping and wanted to turn around.  Eyes watering and think he may have been brightness of hallway.  Performed another bout of gait with L HHA with MIN A in room.  Decreased safety with turning due to IV and R side inattention.    Posture / Balance Balance Overall balance assessment: Needs assistance Sitting-balance support: No upper extremity supported, Feet supported Sitting balance-Leahy Scale: Fair Standing balance support: Single extremity supported, During functional activity Standing balance-Leahy Scale: Poor Standing balance comment: 1 UE assist to stand and complete pericare    Special needs/care consideration BiPAP/CPAP No CPM No Continuous Drip IV D5 and 0.9% NS with KCL 20 meq 75 ml/hr Dialysis No        Life Vest No Oxygen No Special Bed No Trach Size  No Wound Vac (area) No       Skin Has eczema                              Bowel mgmt: Last BM 05/25/15 Bladder mgmt: Voiding WDL Diabetic mgmt No    Previous Home Environment Living Arrangements: Children Home Care Services: No Additional Comments: Unsure of PLOF - no family present. Lives with daughter  Discharge Living Setting Plans for Discharge Living Setting: House, Lives with (comment) (Living with daughter for the past 3 months.) Type of Home at Discharge: House Discharge Home Layout: One level Discharge Home Access: Stairs to enter Entrance Stairs-Number of Steps: 1 + 1 step entry Does the patient have any problems obtaining your medications?: No  Social/Family/Support Systems Patient Roles: Parent (Has daughter, son, grandchildren.) Contact Information: Excell Seltzer - daughter [phone] Anticipated Caregiver: Daughter and granddaughter (29 yr old) Anticipated Caregiver's Contact Information: Jake Church - granddaughter [phone] Ability/Limitations of Caregiver: Daughter works 3rd shift.  Granddaughter goes to school. Caregiver Availability: Other (Comment) (Dtr assures me patient will have supervision at home.) Discharge Plan Discussed with Primary Caregiver: Yes Is Caregiver In Agreement with Plan?: Yes Does Caregiver/Family have Issues with Lodging/Transportation while Pt is in Rehab?: No  Goals/Additional Needs Patient/Family Goal for Rehab: PT/OT/ST mod I and supervision goals Expected length of stay: 19-20 days Cultural Considerations: None Dietary Needs: Dys 1, thin liquids Equipment Needs: TBD Pt/Family Agrees to Admission and willing to participate: Yes Program Orientation Provided & Reviewed with Pt/Caregiver Including Roles  & Responsibilities: Yes  Decrease burden of Care through IP rehab admission: N/A  Possible need for SNF placement upon discharge: Not anticipated  Patient Condition: This patient's condition remains as documented in the  consult dated 05/29/15, in which the Rehabilitation Physician determined and documented that the patient's condition is appropriate for intensive rehabilitative care in an inpatient rehabilitation facility. Will admit to inpatient rehab today.  Preadmission Screen Completed By:  Retta Diones, 05/30/2015 10:02 AM ______________________________________________________________________   Discussed status with Dr. Posey Pronto on 05/30/15 at 1004 and received telephone approval for admission today.  Admission Coordinator:  Retta Diones, time1004/Date12/21/16

## 2015-05-29 NOTE — Evaluation (Signed)
Physical Therapy Evaluation Patient Details [name] MRN: [id number] DOB: 11-11-1955 Today's Date: 05/29/2015   History of Present Illness  59 y/o BF admitted with moderately sized left MCA infarct. PMH of DVT, COPD, anxiety, chronic pain syndrome  Clinical Impression  Pt admitted with above diagnosis. Pt currently with functional limitations due to the deficits listed below (see PT Problem List).  Pt will benefit from skilled PT to increase their independence and safety with mobility to allow discharge to the venue listed below.  Pt with R sided inattention, decreased safety, and aphasia.  Feel pt would be a good candidate for CIR.     Follow Up Recommendations CIR    Equipment Recommendations  None recommended by PT    Recommendations for Other Services Rehab consult     Precautions / Restrictions Precautions Precautions: Fall Precaution Comments: R sided neglect      Mobility  Bed Mobility Overal bed mobility: Needs Assistance Bed Mobility: Supine to Sit;Sit to Supine     Supine to sit: Min guard Sit to supine: Min guard      Transfers Overall transfer level: Needs assistance Equipment used: Rolling walker (2 wheeled);1 person hand held assist Transfers: Sit to/from [facility] Sit to Stand: Min assist Stand pivot transfers: Min assist       General transfer comment: MIN A for safety due to R sided inattention and impulsivity  Ambulation/Gait Ambulation/Gait assistance: Min assist Ambulation Distance (Feet): 20 Feet Assistive device: 1 person hand held assist;Rolling walker (2 wheeled) Gait Pattern/deviations: Step-through pattern     General Gait Details: AMb to doorway with RW with inability to maintain R UE grip on RW.  At doorway, pt abruptly stopping and wanted to turn around.  Eyes watering and think he may have been brightness of hallway.  Performed another bout of gait with L HHA with MIN A in room.  Decreased safety with  turning due to IV and R side inattention.  Stairs            Wheelchair Mobility    Modified Rankin (Stroke Patients Only) Modified Rankin (Stroke Patients Only) Pre-Morbid Rankin Score: No symptoms Modified Rankin: Moderately severe disability     Balance Overall balance assessment: Needs assistance   Sitting balance-Leahy Scale: Fair       Standing balance-Leahy Scale: Poor Standing balance comment: 1 UE assist                             Pertinent Vitals/Pain Pain Assessment: Faces Faces Pain Scale: Hurts even more Pain Location: eyes with light Pain Intervention(s): Monitored during session    Home Living Family/patient expects to be discharged to:: Private residence Living Arrangements: Children               Additional Comments: Unsure of PLOF. Lived with daughter    Prior Function                 Hand Dominance        Extremity/Trunk Assessment   Upper Extremity Assessment: Defer to OT evaluation           Lower Extremity Assessment: Generalized weakness (unable to formally assess due to expressive/receptive aphasia)         Communication      Cognition Arousal/Alertness: Awake/alert Behavior During Therapy: Impulsive Overall Cognitive Status: Impaired/Different from baseline Area of Impairment: Following commands;Safety/judgement;Problem solving     Memory: Decreased recall of  precautions Following Commands: Follows one step commands inconsistently Safety/Judgement: Decreased awareness of safety;Decreased awareness of deficits   Problem Solving: Requires tactile cues;Slow processing;Decreased initiation      General Comments General comments (skin integrity, edema, etc.): Pt pointing at table trying to tell PT/OT what she wants.  Eventually able to determine that she wa hungry. Nursing notified.    Exercises        Assessment/Plan    PT Assessment Patient needs continued PT services  PT Diagnosis  Difficulty walking;Hemiplegia dominant side   PT Problem List    PT Treatment Interventions Gait training;Functional mobility training;Therapeutic activities;Therapeutic exercise;Neuromuscular re-education;Balance training   PT Goals (Current goals can be found in the Care Plan section) Acute Rehab PT Goals Patient Stated Goal: Unable to state PT Goal Formulation: Patient unable to participate in goal setting Time For Goal Achievement: 06/12/15 Potential to Achieve Goals: Good    Frequency Min 4X/week   Barriers to discharge        Co-evaluation PT/OT/SLP Co-Evaluation/Treatment: Yes Reason for Co-Treatment: Necessary to address cognition/behavior during functional activity;For patient/therapist safety           End of Session Equipment Utilized During Treatment: Gait belt Activity Tolerance: Patient tolerated treatment well Patient left: in chair;with call bell/phone within reach;with chair alarm set (Alarm went off <1 min after left the room.  Pt getting up from recliner, assisted back to bed and left with nurse present.) Nurse Communication: Mobility status;Other (comment) (decreased safety)         Time: [id number] PT Time Calculation (min) (ACUTE ONLY): 45 min   Charges:   PT Evaluation $Initial PT Evaluation Tier I: 1 Procedure PT Treatments $Gait Training: 8-22 mins   PT G Codes:        Azavier Creson LUBECK 05/29/2015, 10:23 AM

## 2015-05-29 NOTE — H&P (Addendum)
Physical Medicine and Rehabilitation Admission H&P    Chief Complaint  Patient presents with  . Difficulty speaking.    HPI:  Brooke [name] is a 59 y.o. female with history of HTN, COPD, Colon CA, Migraines, OSA, morbid obesity, DVT/PE on coumadin who was admitted on 05/26/15 after being found by family with confusion, lethargy and difficulty speaking on 05/26/15. History taken from chart review and daughter. Last seen normal 11 pm the night before incident. CT brain with question of early ischemia in L-MCA distribution and INR- 1.61. CTA with confirmed large area of ischemia involving posterior left insular ribbon, superior left temporal lobe, and left parietal white. MRI/MRA brain revealed large L-MCA territory infarct with moderate to severe focal stenosis within more lateral L-M2 branch. She was started on IV heparin for treatment. 2 D echo with EF 50-55%, mild aortic stenosis, trivial MR and no wall abnormality. She developed chest pain with elevated troponin and cardiology consulted for input. Dr. Mare Ferrari felt elevated troponin could bed due to stroke v/s ACS and CP appeared musculoskeletal in nature per exam-doubt PE. Patient not a candidate for cardiac cath and to consider switching to NOAC as patient on lifelong anticoagulation. MBS done to evaluate dysphagia and patient started on dysphagia 1, thin liquids due to difficulty handling oral bolus.   She did have episode of significant hypoxia with oxygen down to 60's this am that resolved with supplemental oxygen and continues to have complaints of chest discomfort felt to be due to MS symptoms. No further work up recommended. Patient with resultant right inattention, right sided weakness/apraxia, global aphasia affecting ability to follow commands, swallowing, insight into deficits as well as safety with mobility.  CIR recommended for follow up therapy and patient cleared medically for admission.   Review of Systems  Unable to  perform ROS: language     Past Medical History  Diagnosis Date  . Hypertension   . Asthma   . COPD (chronic obstructive pulmonary disease) ([place])   . Anxiety   . Colon cancer ([place])     a. s/p surgery, chemotherapy.  . Dyslipidemia   . Migraine headache   . Chronic bronchitis   . Uterine fibroid   . Ovarian cyst   . GERD (gastroesophageal reflux disease)   . Morbid obesity ([place])   . Obstructive sleep apnea     mild  . Chronic pain   . Chronic back pain   . Arthritis   . Osteoarthritis   . Depression   . DVT (deep venous thrombosis) ([place]) 02/2014  . 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 PCCM recommended lifelong anticoagulation.  . Tobacco abuse   . PVC's (premature ventricular contractions) 2013  . Aortic stenosis     a. mild by echo 05/2015.    Past Surgical History  Procedure Laterality Date  . Knee arthroscopy      bil.  . Carpal tunnel release      rt  . Mouth surgery      teeth extraction  . Cesarean section    . Subtotal colectomy    . Colonoscopy    . Tubal ligation      Family History  Problem Relation Age of Onset  . Uterine cancer Mother   . Stroke Father   . Colon cancer Maternal Uncle   . Diabetes Father   . Ovarian cancer Mother   . Hypertension Father   . Breast cancer Maternal Grandmother   . Diabetes Sister   .  Asthma Child   . Asthma Child     Social History:  Has been living with daughter for the past few months and helping out with grandchildren. Per reports that she has been smoking Cigarettes--1 1/2 PPD.  She has a 37 pack-year smoking history. She has never used smokeless tobacco.  Per reports that she drinks alcohol on rare occasion. She does not use illicit drugs.    Allergies  Allergen Reactions  . Kiwi Extract Anaphylaxis, Hives and Swelling    Makes her swell all over   . Aspirin Nausea Only    Can take as long as enteric coated    Medications Prior to Admission  Medication Sig Dispense Refill  .  acetaminophen (TYLENOL) 500 MG tablet Take 500 mg by mouth every 6 (six) hours as needed for headache.    . albuterol (PROVENTIL HFA;VENTOLIN HFA) 108 (90 BASE) MCG/ACT inhaler Inhale 2 puffs into the lungs every 6 (six) hours as needed for wheezing or shortness of breath (shortness of breath).    Marland Kitchen amitriptyline (ELAVIL) 50 MG tablet Take 50 mg by mouth at bedtime.    . citalopram (CELEXA) 20 MG tablet Take 20 mg by mouth daily.    . diphenhydrAMINE (BENADRYL) 25 MG tablet Take 1 tablet (25 mg total) by mouth every 6 (six) hours as needed (at first sign of headache). (Patient taking differently: Take 25 mg by mouth every 6 (six) hours as needed for itching or allergies. ) 10 tablet 0  . furosemide (LASIX) 20 MG tablet Take 1 tablet (20 mg total) by mouth daily. 30 tablet 2  . gabapentin (NEURONTIN) 300 MG capsule Take 1 capsule (300 mg total) by mouth at bedtime. 30 capsule 0  . LORazepam (ATIVAN) 1 MG tablet Take 1 tablet (1 mg total) by mouth every 6 (six) hours as needed for anxiety (anxiety). 30 tablet 0  . omeprazole (PRILOSEC) 40 MG capsule Take 1 capsule (40 mg total) by mouth daily. 30 capsule 3  . oxyCODONE-acetaminophen (PERCOCET) 7.5-325 MG tablet Take 1 tablet by mouth every 6 (six) hours as needed for moderate pain. pain  0  . potassium chloride SA (K-DUR,KLOR-CON) 20 MEQ tablet Take 1 tablet (20 mEq total) by mouth daily. 10 tablet 0  . warfarin (COUMADIN) 5 MG tablet Take 1 tablet (5 mg total) by mouth daily. 30 tablet 2  . B Complex Vitamins (VITAMIN-B COMPLEX PO) Take 1 tablet by mouth daily.    . cyclobenzaprine (FLEXERIL) 10 MG tablet Take 1 tablet (10 mg total) by mouth 2 (two) times daily as needed for muscle spasms. 20 tablet 0  . ipratropium-albuterol (DUONEB) 0.5-2.5 (3) MG/3ML SOLN Take 3 mLs by nebulization every 6 (six) hours. (Patient not taking: Reported on 05/26/2015) 360 mL 1    Home: Home Living Family/patient expects to be discharged to:: Private residence Living  Arrangements: Children Additional Comments: Unsure of PLOF - no family present. Lives with daughter   Functional History:    Functional Status:  Mobility: Bed Mobility Overal bed mobility: Needs Assistance Bed Mobility: Supine to Sit, Sit to Supine Supine to sit: Min assist Sit to supine: Min assist General bed mobility comments: Min assist (hand held assist) for boost to come to sitting and for safety due to pt's impulsivity. HOB slightly elevated. Transfers Overall transfer level: Needs assistance Equipment used: Rolling walker (2 wheeled), 1 person hand held assist Transfers: Sit to/from Stand Sit to Stand: Min assist, +2 safety/equipment Stand pivot transfers: Min assist, +2 safety/equipment  General transfer comment: Min assist +2 for safety due to pt's severe impulsivity, unsteadiness and R inattention. Pt unable to grip RW with R hand without assist and ambulated better without AD.  Ambulation/Gait Ambulation/Gait assistance: Min assist Ambulation Distance (Feet): 20 Feet Assistive device: 1 person hand held assist, Rolling walker (2 wheeled) Gait Pattern/deviations: Step-through pattern General Gait Details: AMb to doorway with RW with inability to maintain R UE grip on RW.  At doorway, pt abruptly stopping and wanted to turn around.  Eyes watering and think he may have been brightness of hallway.  Performed another bout of gait with L HHA with MIN A in room.  Decreased safety with turning due to IV and R side inattention.    ADL: ADL Overall ADL's : Needs assistance/impaired Grooming: Wash/dry face, Set up, Cueing for sequencing, Sitting Lower Body Bathing: Minimal assistance, +2 for safety/equipment, Cueing for sequencing, Sit to/from stand Upper Body Dressing : Set up, Cueing for sequencing, Sitting Toilet Transfer: Minimal assistance, +2 for safety/equipment, Ambulation, BSC, Cueing for sequencing, Cueing for safety Toileting- Clothing Manipulation and Hygiene: Minimal  assistance, Cueing for safety, Cueing for sequencing, Sit to/from stand Functional mobility during ADLs: Minimal assistance, +2 for safety/equipment, Rolling walker General ADL Comments: Pt very impulsive and with decreased attention. Mod verbal cues to initiate movement and to maintain safety while ambulating and completing ADLs. Pt spontaneously attempting to use RUE to reach for items and to wave "goodbye" but had increased difficulty when asked to use RUE. At end of session, pt attempted to get out of chair and climb into the bed despite max verbal and tactile cues to stop and wait for assistance.    Cognition: Cognition Overall Cognitive Status: Impaired/Different from baseline Arousal/Alertness: Lethargic Orientation Level: Oriented to person, Oriented to place, Other (comment) (pt asleep but arousable  ) Attention: Focused, Sustained Focused Attention: Appears intact Sustained Attention: Impaired Sustained Attention Impairment: Verbal complex, Functional complex Memory: Appears intact Awareness: Impaired Awareness Impairment: Intellectual impairment, Emergent impairment, Anticipatory impairment Problem Solving: Appears intact Behaviors: Restless, Impulsive, Perseveration, Poor frustration tolerance Safety/Judgment: Impaired Cognition Arousal/Alertness: Awake/alert Behavior During Therapy: Impulsive Overall Cognitive Status: Impaired/Different from baseline Area of Impairment: Following commands, Awareness, Safety/judgement, Problem solving, Attention Current Attention Level: Sustained Memory: Decreased recall of precautions Following Commands: Follows one step commands inconsistently Safety/Judgement: Decreased awareness of safety, Decreased awareness of deficits Awareness: Intellectual Problem Solving: Requires verbal cues, Requires tactile cues, Decreased initiation, Slow processing Difficult to assess due to: Impaired communication   Blood pressure 101/75, pulse 78,  temperature 98 F (36.7 C), temperature source Oral, resp. rate 20, height 4' 11"  (1.499 m), weight 108.6 kg (239 lb 6.7 oz), last menstrual period 05/24/2003, SpO2 98 %. Physical Exam  Nursing note and vitals reviewed. Constitutional: She appears well-developed and well-nourished.  Obese female. NAD. Sitting up in bed with tendency to list to the left.   HENT:  Head: Normocephalic and atraumatic.  Eyes: Conjunctivae and EOM are normal. Pupils are equal, round, and reactive to light.  Neck: Normal range of motion. Neck supple.  Cardiovascular: Normal rate and regular rhythm.   Respiratory: Effort normal. No respiratory distress. She has wheezes. She exhibits tenderness.  GI: Soft. Bowel sounds are normal. She exhibits no distension. There is no tenderness.  Musculoskeletal: She exhibits no edema or tenderness.  Neurological: She is alert.  Receptive > expressive aphasia with severe dysarthria and apraxia.   She was able to answer yes/no question with about 75% accuracy.  With choice  of two was able to state that she was in hospital and had a stroke.  Unable to point and displayed perseverative behaviors and difficult to redirect at times.  Has right inattention with right body neglect.   Impulsive and lacks insight/awareness of deficits.   Sensation appears to be diminished RLE Motor: LUE/LLE: Appears to be 4/5 proximal to distal RUE: Appears to be 4-/5 shoulder abduction, elbow flexion/extension, 0/5 finger grip RLE: Appears to be 2/5 proximal to distal  Skin: Skin is warm and dry.  Psychiatric: Her affect is blunt. Her speech is rapid and/or pressured (at times) and slurred. She expresses impulsivity. She is inattentive.    Results for orders placed or performed during the hospital encounter of 05/26/15 (from the past 48 hour(s))  Troponin I     Status: Abnormal   Collection Time: 05/28/15 10:41 AM  Result Value Ref Range   Troponin I 0.24 (H) <0.031 ng/mL    Comment:          PERSISTENTLY INCREASED TROPONIN VALUES IN THE RANGE OF 0.04-0.49 ng/mL CAN BE SEEN IN:       -UNSTABLE ANGINA       -CONGESTIVE HEART FAILURE       -MYOCARDITIS       -CHEST TRAUMA       -ARRYHTHMIAS       -LATE PRESENTING MYOCARDIAL INFARCTION       -COPD   CLINICAL FOLLOW-UP RECOMMENDED.   Magnesium     Status: None   Collection Time: 05/28/15 10:41 AM  Result Value Ref Range   Magnesium 2.0 1.7 - 2.4 mg/dL  Heparin level (unfractionated)     Status: Abnormal   Collection Time: 05/28/15  2:35 PM  Result Value Ref Range   Heparin Unfractionated <0.10 (L) 0.30 - 0.70 IU/mL    Comment:        IF HEPARIN RESULTS ARE BELOW EXPECTED VALUES, AND PATIENT DOSAGE HAS BEEN CONFIRMED, SUGGEST FOLLOW UP TESTING OF ANTITHROMBIN III LEVELS. REPEATED TO VERIFY   Troponin I (q 6hr x 3)     Status: Abnormal   Collection Time: 05/28/15  5:00 PM  Result Value Ref Range   Troponin I 0.29 (H) <0.031 ng/mL    Comment:        PERSISTENTLY INCREASED TROPONIN VALUES IN THE RANGE OF 0.04-0.49 ng/mL CAN BE SEEN IN:       -UNSTABLE ANGINA       -CONGESTIVE HEART FAILURE       -MYOCARDITIS       -CHEST TRAUMA       -ARRYHTHMIAS       -LATE PRESENTING MYOCARDIAL INFARCTION       -COPD   CLINICAL FOLLOW-UP RECOMMENDED.   Troponin I (q 6hr x 3)     Status: Abnormal   Collection Time: 05/28/15 11:00 PM  Result Value Ref Range   Troponin I 0.23 (H) <0.031 ng/mL    Comment:        PERSISTENTLY INCREASED TROPONIN VALUES IN THE RANGE OF 0.04-0.49 ng/mL CAN BE SEEN IN:       -UNSTABLE ANGINA       -CONGESTIVE HEART FAILURE       -MYOCARDITIS       -CHEST TRAUMA       -ARRYHTHMIAS       -LATE PRESENTING MYOCARDIAL INFARCTION       -COPD   CLINICAL FOLLOW-UP RECOMMENDED.   CBC     Status: None   Collection Time: 05/29/15  5:28 AM  Result Value Ref Range   WBC 5.7 4.0 - 10.5 K/uL   RBC 3.94 3.87 - 5.11 MIL/uL   Hemoglobin 12.8 12.0 - 15.0 g/dL   HCT 38.7 36.0 - 46.0 %   MCV 98.2 78.0 -  100.0 fL   MCH 32.5 26.0 - 34.0 pg   MCHC 33.1 30.0 - 36.0 g/dL   RDW 13.7 11.5 - 15.5 %   Platelets 164 150 - 400 K/uL  Basic metabolic panel     Status: Abnormal   Collection Time: 05/29/15  5:28 AM  Result Value Ref Range   Sodium 137 135 - 145 mmol/L   Potassium 3.6 3.5 - 5.1 mmol/L   Chloride 103 101 - 111 mmol/L   CO2 27 22 - 32 mmol/L   Glucose, Bld 114 (H) 65 - 99 mg/dL   BUN <5 (L) 6 - 20 mg/dL   Creatinine, Ser 0.57 0.44 - 1.00 mg/dL   Calcium 8.6 (L) 8.9 - 10.3 mg/dL   GFR calc non Af Amer >60 >60 mL/min   GFR calc Af Amer >60 >60 mL/min    Comment: (NOTE) The eGFR has been calculated using the CKD EPI equation. This calculation has not been validated in all clinical situations. eGFR's persistently <60 mL/min signify possible Chronic Kidney Disease.    Anion gap 7 5 - 15  Troponin I (q 6hr x 3)     Status: Abnormal   Collection Time: 05/29/15  5:28 AM  Result Value Ref Range   Troponin I 0.18 (H) <0.031 ng/mL    Comment:        PERSISTENTLY INCREASED TROPONIN VALUES IN THE RANGE OF 0.04-0.49 ng/mL CAN BE SEEN IN:       -UNSTABLE ANGINA       -CONGESTIVE HEART FAILURE       -MYOCARDITIS       -CHEST TRAUMA       -ARRYHTHMIAS       -LATE PRESENTING MYOCARDIAL INFARCTION       -COPD   CLINICAL FOLLOW-UP RECOMMENDED.   CBC     Status: None   Collection Time: 05/30/15  4:43 AM  Result Value Ref Range   WBC 5.6 4.0 - 10.5 K/uL   RBC 4.23 3.87 - 5.11 MIL/uL   Hemoglobin 13.7 12.0 - 15.0 g/dL   HCT 41.6 36.0 - 46.0 %   MCV 98.3 78.0 - 100.0 fL   MCH 32.4 26.0 - 34.0 pg   MCHC 32.9 30.0 - 36.0 g/dL   RDW 13.7 11.5 - 15.5 %   Platelets 160 150 - 400 K/uL  Basic metabolic panel     Status: Abnormal   Collection Time: 05/30/15  4:43 AM  Result Value Ref Range   Sodium 138 135 - 145 mmol/L   Potassium 3.5 3.5 - 5.1 mmol/L   Chloride 103 101 - 111 mmol/L   CO2 28 22 - 32 mmol/L   Glucose, Bld 92 65 - 99 mg/dL   BUN <5 (L) 6 - 20 mg/dL   Creatinine, Ser 0.65  0.44 - 1.00 mg/dL   Calcium 9.0 8.9 - 10.3 mg/dL   GFR calc non Af Amer >60 >60 mL/min   GFR calc Af Amer >60 >60 mL/min    Comment: (NOTE) The eGFR has been calculated using the CKD EPI equation. This calculation has not been validated in all clinical situations. eGFR's persistently <60 mL/min signify possible Chronic Kidney Disease.    Anion gap 7 5 -  15    Medical Problem List and Plan: 1.  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 2.  H/o DVT/PE/Anticoagulation: Pharmaceutical: Xarelto 3. Chronic back pain/Pain Management: Discontinue IV morphine and will resume oxycodone (Pain mangement in [place]?). Need to use judiciously to avoid sedation/respiratory suppression.  4. Mood: Monitor for mood for now--patient lacks insight and awareness or deficits. LCSW to follow along and complete evaluation when appropriate.   5. Neuropsych: This patient is not capable of making decisions on her own behalf. 6. Skin/Wound Care: Routine pressure relief measures 7. Fluids/Electrolytes/Nutrition:  Monitor I/O. Check lytes in am. Offer supplements between meals.  8. HTN: Monitor BP bid. Lasix changed to HCTZ by primary team. 9. Asthma/COPD:  Encourage IS as able. Avoid narcotic and may need to schedule nebs to help manage wheezing/hypoxia.  10. Morbid obesity: Pressure relief measures. Heart Healthy diet.  11. Mild OSA:  Question OHS--may have CPAP at home--unknown as patient has been living with daughter for past 3-4 months.   12. Depression/ Anxiety disorder: Used Celexa and ativan at home. Resume Celexa.   Post Admission Physician Evaluation: 1. Functional deficits secondary  to L-MCA infarct. 2. Patient is admitted to receive collaborative, interdisciplinary care between the physiatrist, rehab nursing staff, and therapy team. 3. Patient's level of medical complexity and substantial therapy needs in context of that medical necessity cannot be provided at a lesser  intensity of care such as a SNF. 4. Patient has experienced substantial functional loss from his/her baseline which was documented above under the "Functional History" and "Functional Status" headings.  Judging by the patient's diagnosis, physical exam, and functional history, the patient has potential for functional progress which will result in measurable gains while on inpatient rehab.  These gains will be of substantial and practical use upon discharge  in facilitating mobility and self-care at the household level. 5. Physiatrist will provide 24 hour management of medical needs as well as oversight of the therapy plan/treatment and provide guidance as appropriate regarding the interaction of the two. 6. 24 hour rehab nursing will assist with safety, disease management, medication administration and patient education and help integrate therapy concepts, techniques,education, etc. 7. PT will assess and treat for/with: Lower extremity strength, range of motion, stamina, balance, functional mobility, safety, adaptive techniques and equipment, coping skills, stroke education.   Goals are: Supervision/Mod I. 8. OT will assess and treat for/with: ADL's, functional mobility, safety, upper extremity strength, adaptive techniques and equipment, ego support, and community reintegration.   Goals are: supervision/Mod I. Therapy may proceed with showering this patient. 9. SLP will assess and treat for/with: speech, language, swallowing, cognition.  Goals are: Min A/Supervision. 10. Case Management and Social Worker will assess and treat for psychological issues and discharge planning. 11. Team conference will be held weekly to assess progress toward goals and to determine barriers to discharge. 12. Patient will receive at least 3 hours of therapy per day at least 5 days per week. 13. ELOS: 16-19 days.       14. Prognosis:  good  Delice Lesch, MD 05/30/2015

## 2015-05-29 NOTE — Progress Notes (Signed)
Rehab admissions - Evaluated for possible admission.  I met with patient.  She is aphasic.  I met daughter in the hallway.  Dtr would like inpatient rehab admission.  I have called and opened the case with [facility] medicare requesting acute inpatient rehab admission.  I will follow up once I hear back from insurance carrier.  Call me for questions.  #[phone]

## 2015-05-29 NOTE — Progress Notes (Signed)
Patient [name] Date of Encounter: 05/29/2015     Active Problems:   Hypokalemia   Chest pain   Stroke [facility])   Acute ischemic stroke (HCC)   Aphasia   Weakness   Elevated troponin   Essential hypertension   Hyperkalemia   History of pulmonary embolism   History of DVT (deep vein thrombosis)   Global aphasia    SUBJECTIVE  She was more alert today.  No apparent dyspnea or respiratory distress.  She is starting on Xarelto.  CURRENT MEDS . antiseptic oral rinse  7 mL Mouth Rinse q12n4p  . aspirin  300 mg Rectal Daily  . atorvastatin  40 mg Oral q1800  . chlorhexidine  15 mL Mouth Rinse BID  . pantoprazole (PROTONIX) IV  40 mg Intravenous Daily  . rivaroxaban  20 mg Oral Q supper  . sodium chloride  10-40 mL Intracatheter Q12H  . sodium chloride  3 mL Intravenous Q12H    OBJECTIVE  Filed Vitals:   05/28/15 2132 05/29/15 0140 05/29/15 0600 05/29/15 0842  BP: 153/80 139/64 150/70   Pulse: 71 72 73   Temp: 98.6 F (37 C) 98.3 F (36.8 C) 97.7 F (36.5 C)   TempSrc: Oral Oral Axillary   Resp: 18 18 18    Height:      Weight:      SpO2: 98% 100% 100% 98%   No intake or output data in the 24 hours ending 05/29/15 0855 Filed Weights   05/26/15 0915  Weight: 239 lb 6.7 oz (108.6 kg)    PHYSICAL EXAM  General: Pleasant, NAD. Neuro: Expressive aphasia Psych: Normal affect. HEENT:  Normal  Neck: Supple without bruits or JVD. Lungs:  Resp regular and unlabored, CTA. Heart: RRR no s3, s4, or murmurs.  No pericardial rub. Abdomen: Soft, non-tender, non-distended, BS + x 4.  Extremities: No clubbing, cyanosis or edema. DP/PT/Radials 2+ and equal bilaterally.  Accessory Clinical Findings  CBC  Recent Labs  05/26/15 0929  05/28/15 0603 05/29/15 0528  WBC 6.5  < > 6.3 5.7  NEUTROABS 3.4  --   --   --   HGB 15.2*  < > 13.2 12.8  HCT 47.7*  < > 40.7 38.7  MCV 100.6*  < > 98.1 98.2  PLT 215  < > 191 164  < > = values in this interval not  displayed. Basic Metabolic Panel  Recent Labs  05/28/15 0603 05/28/15 1041 05/29/15 0528  NA 137  --  137  K 3.4*  --  3.6  CL 103  --  103  CO2 25  --  27  GLUCOSE 98  --  114*  BUN <5*  --  <5*  CREATININE 0.64  --  0.57  CALCIUM 8.4*  --  8.6*  MG  --  2.0  --    Liver Function Tests  Recent Labs  05/26/15 0929  AST 24  ALT 13*  ALKPHOS 85  BILITOT 1.0  PROT 7.5  ALBUMIN 3.7   No results for input(s): LIPASE, AMYLASE in the last 72 hours. Cardiac Enzymes  Recent Labs  05/28/15 1700 05/28/15 2300 05/29/15 0528  TROPONINI 0.29* 0.23* 0.18*   BNP Invalid input(s): POCBNP D-Dimer No results for input(s): DDIMER in the last 72 hours. Hemoglobin A1C  Recent Labs  05/26/15 1145  HGBA1C 5.6   Fasting Lipid Panel  Recent Labs  05/27/15 0110  CHOL 139  HDL 54  LDLCALC 73  TRIG 62  CHOLHDL 2.6   Thyroid Function Tests No results for input(s): TSH, T4TOTAL, T3FREE, THYROIDAB in the last 72 hours.  Invalid input(s): FREET3  TELE normal sinus rhythm at 69 bpm  ECG    Radiology/Studies  Ct Angio Head W/cm &/or Wo Cm  05/26/2015  CLINICAL DATA:  Neck up stroke. Altered mental status. Unable to speak clearly. Right-sided weakness and facial droop. EXAM: CT ANGIOGRAPHY HEAD AND NECK TECHNIQUE: Multidetector CT imaging of the head and neck was performed using the standard protocol during bolus administration of intravenous contrast. Multiplanar CT image reconstructions and MIPs were obtained to evaluate the vascular anatomy. Carotid stenosis measurements (when applicable) are obtained utilizing NASCET criteria, using the distal internal carotid diameter as the denominator. CONTRAST:  78mL OMNIPAQUE IOHEXOL 350 MG/ML SOLN COMPARISON:  CT profusion study from the same day. CT head without contrast from the same day. FINDINGS: CT HEAD Brain: Source images confirm an acute infarct involving the posterior left insular ribbon is well is the cortex over the  superior left temporal lobe and left parietal lobe. No acute hemorrhage or mass lesion is present. The ganglia are intact. Calvarium and skull base: Negative. Paranasal sinuses: Clear Orbits: Within normal limits. CTA NECK Aortic arch: A 3 vessel arch configuration is present. Atherosclerotic calcifications are present without significant stenoses at the origins the great vessels. Right carotid system: The right common carotid artery is within normal limits. Minimal atherosclerotic changes are present at the carotid bifurcation. The cervical right ICA is normal. Left carotid system: The left common carotid artery is within normal limits. Atherosclerotic changes are slightly more prominent within the left carotid bifurcation. There is no significant stenosis relative to the more distal vessel. The cervical left ICA is normal. Vertebral arteries:The vertebral arteries both originate from the subclavian arteries without significant stenosis. The left vertebral artery is dominant. There is no focal stenosis or vascular injury within the cervical vertebral arteries. Skeleton: Mild endplate degenerative changes are most noted at C3-4 and C4-5. No focal lytic or blastic lesions are present. Other neck: The soft tissues of the neck are unremarkable. CTA HEAD Anterior circulation: The internal carotid arteries are within normal limits from the high cervical segments through the ICA termini. The left A1 segment is dominant. The M1 segments are intact bilaterally. There is a significant proximal stenosis in the more lateral left M2 segment with significant attenuation of more distal branch vessels no focal proximal occlusion is present. Right MCA and bilateral ACA branch vessels are within normal limits. Posterior circulation: The left vertebral artery is slightly dominant to the right. The PICA origins are visualized and normal bilaterally. Basilar artery is within normal limits. Both posterior cerebral arteries originate the  basilar tip. PCA branch vessels are intact. Venous sinuses: The dural sinuses are patent. The right transverse sinus is dominant. The straight sinus and deep cerebral veins are patent. Cortical veins are intact. Anatomic variants: None Delayed phase: Postcontrast images better demonstrate the areas of developing acute nonhemorrhagic infarction involving the posterior left insular cortex, left temporal lobe, and left parietal lobe. No pathologic enhancement is evident. IMPRESSION: 1. Developing acute/subacute nonhemorrhagic infarct of posterior left insular cortex, superior left temporal, and left parietal lobe. 2. Moderate to severe focal stenosis within the more lateral left M2 branch with significant attenuation of distal MCA branch vessels corresponding to the infarct territory. 3. No other significant proximal stenosis, aneurysm, or branch vessel occlusion. These results were called by telephone at the time of interpretation on 05/26/2015 at  12:51 Pm to Dr. Aram Beecham, who verbally acknowledged these results. Electronically Signed   By: San Morelle M.D.   On: 05/26/2015 13:10   Ct Head Wo Contrast  05/26/2015  CLINICAL DATA:  Unresponsive EXAM: CT HEAD WITHOUT CONTRAST TECHNIQUE: Contiguous axial images were obtained from the base of the skull through the vertex without intravenous contrast. COMPARISON:  04/21/2015 FINDINGS: Bony calvarium is intact. No acute hemorrhage or space-occupying mass lesion is noted. There is some blunting of the sulcal markings in the left parietal lobe near the vertex which may represent some very early ischemia. No other focal abnormality is noted. IMPRESSION: Question early ischemia in distribution of the left middle cerebral artery. No other focal abnormality is noted. These results were called by telephone at the time of interpretation on 05/26/2015 at 9:11 am to Dr. Jola Schmidt , who verbally acknowledged these results. Electronically Signed   By: Inez Catalina M.D.    On: 05/26/2015 09:11   Ct Angio Neck W/cm &/or Wo/cm  05/26/2015  CLINICAL DATA:  Neck up stroke. Altered mental status. Unable to speak clearly. Right-sided weakness and facial droop. EXAM: CT ANGIOGRAPHY HEAD AND NECK TECHNIQUE: Multidetector CT imaging of the head and neck was performed using the standard protocol during bolus administration of intravenous contrast. Multiplanar CT image reconstructions and MIPs were obtained to evaluate the vascular anatomy. Carotid stenosis measurements (when applicable) are obtained utilizing NASCET criteria, using the distal internal carotid diameter as the denominator. CONTRAST:  71mL OMNIPAQUE IOHEXOL 350 MG/ML SOLN COMPARISON:  CT profusion study from the same day. CT head without contrast from the same day. FINDINGS: CT HEAD Brain: Source images confirm an acute infarct involving the posterior left insular ribbon is well is the cortex over the superior left temporal lobe and left parietal lobe. No acute hemorrhage or mass lesion is present. The ganglia are intact. Calvarium and skull base: Negative. Paranasal sinuses: Clear Orbits: Within normal limits. CTA NECK Aortic arch: A 3 vessel arch configuration is present. Atherosclerotic calcifications are present without significant stenoses at the origins the great vessels. Right carotid system: The right common carotid artery is within normal limits. Minimal atherosclerotic changes are present at the carotid bifurcation. The cervical right ICA is normal. Left carotid system: The left common carotid artery is within normal limits. Atherosclerotic changes are slightly more prominent within the left carotid bifurcation. There is no significant stenosis relative to the more distal vessel. The cervical left ICA is normal. Vertebral arteries:The vertebral arteries both originate from the subclavian arteries without significant stenosis. The left vertebral artery is dominant. There is no focal stenosis or vascular injury within  the cervical vertebral arteries. Skeleton: Mild endplate degenerative changes are most noted at C3-4 and C4-5. No focal lytic or blastic lesions are present. Other neck: The soft tissues of the neck are unremarkable. CTA HEAD Anterior circulation: The internal carotid arteries are within normal limits from the high cervical segments through the ICA termini. The left A1 segment is dominant. The M1 segments are intact bilaterally. There is a significant proximal stenosis in the more lateral left M2 segment with significant attenuation of more distal branch vessels no focal proximal occlusion is present. Right MCA and bilateral ACA branch vessels are within normal limits. Posterior circulation: The left vertebral artery is slightly dominant to the right. The PICA origins are visualized and normal bilaterally. Basilar artery is within normal limits. Both posterior cerebral arteries originate the basilar tip. PCA branch vessels are intact. Venous sinuses: The  dural sinuses are patent. The right transverse sinus is dominant. The straight sinus and deep cerebral veins are patent. Cortical veins are intact. Anatomic variants: None Delayed phase: Postcontrast images better demonstrate the areas of developing acute nonhemorrhagic infarction involving the posterior left insular cortex, left temporal lobe, and left parietal lobe. No pathologic enhancement is evident. IMPRESSION: 1. Developing acute/subacute nonhemorrhagic infarct of posterior left insular cortex, superior left temporal, and left parietal lobe. 2. Moderate to severe focal stenosis within the more lateral left M2 branch with significant attenuation of distal MCA branch vessels corresponding to the infarct territory. 3. No other significant proximal stenosis, aneurysm, or branch vessel occlusion. These results were called by telephone at the time of interpretation on 05/26/2015 at 12:51 Pm to Dr. Aram Beecham, who verbally acknowledged these results. Electronically  Signed   By: San Morelle M.D.   On: 05/26/2015 13:10   Mr Jodene Nam Head Wo Contrast  05/27/2015  CLINICAL DATA:  Left MCA territory infarct. Abnormal speech. Facial droop. The examination had to be discontinued prior to completion due to patient unable to tolerate despite medication. EXAM: MRI HEAD WITHOUT CONTRAST MRA HEAD WITHOUT CONTRAST TECHNIQUE: Multiplanar, multiecho pulse sequences of the brain and surrounding structures were obtained without intravenous contrast. Angiographic images of the head were obtained using MRA technique without contrast. COMPARISON:  CTA head and neck, CT perfusion, and CT head 05/26/2015 FINDINGS: MRI HEAD FINDINGS The diffusion-weighted images confirm an acute infarct involving the posterior aspect of the left MCA territory. The posterior tooth there is the left insular cortex are involved. There is involvement of the posterior left frontal lobe, left parietal lobe, in the superior left temporal lobe. There is significant patient motion which could cause some artifact on the susceptibility weighted imaging. However, petechial hemorrhages suspected within the left temporal and parietal lobe infarct. MRA HEAD FINDINGS The MRA is also distorted by patient motion. Internal carotid arteries are within normal limits from the high cervical segments through the ICA termini bilaterally. The A1 and M1 segments are intact. The MCA bifurcations are intact. There is significant attenuation of MCA branch vessels bilaterally, distorted by patient motion. The left vertebral artery is the dominant vessel. The PICA origins are visualized and normal bilaterally. The basilar artery is within normal limits. Both posterior cerebral arteries originate from basilar tip. The posterior cerebral arteries are within normal limits bilaterally. IMPRESSION: 1. Confirmation of an acute infarct involving the posterior left MCA territory. 2. Probable petechial hemorrhage within the infarct. 3. Significant  attenuation of proximal MCA branch vessels bilaterally without a more proximal stenosis or occlusion. 4. The study is moderately degraded by patient motion, limiting evaluation of medium and distal small vessels. Electronically Signed   By: San Morelle M.D.   On: 05/27/2015 12:03   Mr Brain Wo Contrast  05/27/2015  CLINICAL DATA:  Left MCA territory infarct. Abnormal speech. Facial droop. The examination had to be discontinued prior to completion due to patient unable to tolerate despite medication. EXAM: MRI HEAD WITHOUT CONTRAST MRA HEAD WITHOUT CONTRAST TECHNIQUE: Multiplanar, multiecho pulse sequences of the brain and surrounding structures were obtained without intravenous contrast. Angiographic images of the head were obtained using MRA technique without contrast. COMPARISON:  CTA head and neck, CT perfusion, and CT head 05/26/2015 FINDINGS: MRI HEAD FINDINGS The diffusion-weighted images confirm an acute infarct involving the posterior aspect of the left MCA territory. The posterior tooth there is the left insular cortex are involved. There is involvement of the posterior left frontal  lobe, left parietal lobe, in the superior left temporal lobe. There is significant patient motion which could cause some artifact on the susceptibility weighted imaging. However, petechial hemorrhages suspected within the left temporal and parietal lobe infarct. MRA HEAD FINDINGS The MRA is also distorted by patient motion. Internal carotid arteries are within normal limits from the high cervical segments through the ICA termini bilaterally. The A1 and M1 segments are intact. The MCA bifurcations are intact. There is significant attenuation of MCA branch vessels bilaterally, distorted by patient motion. The left vertebral artery is the dominant vessel. The PICA origins are visualized and normal bilaterally. The basilar artery is within normal limits. Both posterior cerebral arteries originate from basilar tip. The  posterior cerebral arteries are within normal limits bilaterally. IMPRESSION: 1. Confirmation of an acute infarct involving the posterior left MCA territory. 2. Probable petechial hemorrhage within the infarct. 3. Significant attenuation of proximal MCA branch vessels bilaterally without a more proximal stenosis or occlusion. 4. The study is moderately degraded by patient motion, limiting evaluation of medium and distal small vessels. Electronically Signed   By: San Morelle M.D.   On: 05/27/2015 12:03   Ct Cerebral Perfusion W/cm  05/26/2015  ADDENDUM REPORT: 05/26/2015 13:11 ADDENDUM: These results were called by telephone at the time of interpretation on 05/26/2015 at 12:51 pm to Dr. Aram Beecham, who verbally acknowledged these results. Electronically Signed   By: San Morelle M.D.   On: 05/26/2015 13:11  05/26/2015  CLINICAL DATA:  Altered mental status.  Mental SP. EXAM: CT CEREBRAL PERFUSION WITH CONTRAST TECHNIQUE: Dynamic imaging was performed following the pulse injection of contrast. Para metric maps were subsequently calculated. CONTRAST:  21mL OMNIPAQUE IOHEXOL 350 MG/ML SOLN COMPARISON:  CT head without contrast from the same date. FINDINGS: Being time to transit is ext the and cerebral blood flow is diminished in the superior left temporal lobe and left parietal lobe within the posterior left MCA distribution. This involves posterior aspect of the left insular ribbon. There is less profound decrease in cerebral blood volume within the same territory. Less than 1/3 of the left MCA territory constitutes core infarct. Parameters are within normal limits in the right hemisphere IMPRESSION: 1. Large area of ischemia involving the posterior aspect of the left MCA territory. This involves the posterior left insular ribbon, superior left temporal lobe, and left parietal white. 2. More subtle cerebral blood volume changes with the area of core infarct constituting less than 1/3 of the left MCA  territory. Electronically Signed: By: San Morelle M.D. On: 05/26/2015 12:51   Dg Swallowing Func-speech Pathology  05/28/2015  Objective Swallowing Evaluation:   Patient Details Name: RAILYN BALLADARES MRN: [id number] Date of Birth: 1956-01-06 Today's Date: 05/28/2015 Time: SLP Start Time (ACUTE ONLY): 1321-SLP Stop Time (ACUTE ONLY): 1335 SLP Time Calculation (min) (ACUTE ONLY): 14 min Past Medical History: Past Medical History Diagnosis Date . Hypertension  . Asthma  . COPD (chronic obstructive pulmonary disease) ([place])  . Anxiety  . Colon cancer ([place])    a. s/p surgery, chemotherapy. . Dyslipidemia  . Migraine headache  . Chronic bronchitis  . Uterine fibroid  . Ovarian cyst  . GERD (gastroesophageal reflux disease)  . Morbid obesity ([place])  . Obstructive sleep apnea    mild . Chronic pain  . Chronic back pain  . Arthritis  . Osteoarthritis  . Depression  . DVT (deep venous thrombosis) ([place]) 02/2014 . 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 PCCM recommended lifelong anticoagulation. [place] Kitchen  Tobacco abuse  . PVC's (premature ventricular contractions) 2013 . Aortic stenosis    a. mild by echo 05/2015. Past Surgical History: Past Surgical History Procedure Laterality Date . Knee arthroscopy     bil. . Carpal tunnel release     rt . Mouth surgery     teeth extraction . Cesarean section   . Subtotal colectomy   . Colonoscopy   . Tubal ligation   HPI: 59 y.o. female brought in with acute receptive>>expressive aphasia. Woke up with stroke, with CT already showing question of early ischemia in distribution of the left middle cerebral artery.  Subjective: pt expressively aphasic Assessment / Plan / Recommendation CHL IP CLINICAL IMPRESSIONS 05/28/2015 Therapy Diagnosis Moderate oral phase dysphagia Clinical Impression Pt has a moderate oral dysphagia, although has good strength and timing of her pharyngeal phase. She has prolonged oral preparation and mastication, with small amounts of oral residue remaining after  the swallow. Min cues provided for continued mastication with soft solid. Recommend initiation of Dys 1 textures and thin liquids, meds crushed in puree. Impact on safety and function Mild aspiration risk   CHL IP TREATMENT RECOMMENDATION 05/28/2015 Treatment Recommendations Therapy as outlined in treatment plan below   Prognosis 05/28/2015 Prognosis for Safe Diet Advancement Good Barriers to Reach Goals -- Barriers/Prognosis Comment -- CHL IP DIET RECOMMENDATION 05/28/2015 SLP Diet Recommendations Dysphagia 1 (Puree) solids;Thin liquid Liquid Administration via Cup;Straw Medication Administration Crushed with puree Compensations Minimize environmental distractions;Slow rate;Small sips/bites;Lingual sweep for clearance of pocketing Postural Changes Seated upright at 90 degrees   CHL IP OTHER RECOMMENDATIONS 05/28/2015 Recommended Consults -- Oral Care Recommendations Oral care BID;Oral care before and after PO Other Recommendations --   CHL IP FOLLOW UP RECOMMENDATIONS 05/28/2015 Follow up Recommendations Inpatient Rehab   CHL IP FREQUENCY AND DURATION 05/28/2015 Speech Therapy Frequency (ACUTE ONLY) min 2x/week Treatment Duration 2 weeks      CHL IP ORAL PHASE 05/28/2015 Oral Phase Impaired Oral - Pudding Teaspoon -- Oral - Pudding Cup -- Oral - Honey Teaspoon -- Oral - Honey Cup -- Oral - Nectar Teaspoon -- Oral - Nectar Cup -- Oral - Nectar Straw -- Oral - Thin Teaspoon -- Oral - Thin Cup Weak lingual manipulation;Reduced posterior propulsion;Lingual/palatal residue Oral - Thin Straw Weak lingual manipulation;Reduced posterior propulsion;Lingual/palatal residue Oral - Puree Weak lingual manipulation;Reduced posterior propulsion;Lingual/palatal residue Oral - Mech Soft Weak lingual manipulation;Reduced posterior propulsion;Lingual/palatal residue;Impaired mastication Oral - Regular -- Oral - Multi-Consistency -- Oral - Pill -- Oral Phase - Comment --  CHL IP PHARYNGEAL PHASE 05/28/2015 Pharyngeal Phase WFL  Pharyngeal- Pudding Teaspoon -- Pharyngeal -- Pharyngeal- Pudding Cup -- Pharyngeal -- Pharyngeal- Honey Teaspoon -- Pharyngeal -- Pharyngeal- Honey Cup -- Pharyngeal -- Pharyngeal- Nectar Teaspoon -- Pharyngeal -- Pharyngeal- Nectar Cup -- Pharyngeal -- Pharyngeal- Nectar Straw -- Pharyngeal -- Pharyngeal- Thin Teaspoon -- Pharyngeal -- Pharyngeal- Thin Cup -- Pharyngeal -- Pharyngeal- Thin Straw -- Pharyngeal -- Pharyngeal- Puree -- Pharyngeal -- Pharyngeal- Mechanical Soft -- Pharyngeal -- Pharyngeal- Regular -- Pharyngeal -- Pharyngeal- Multi-consistency -- Pharyngeal -- Pharyngeal- Pill -- Pharyngeal -- Pharyngeal Comment --  CHL IP CERVICAL ESOPHAGEAL PHASE 05/28/2015 Cervical Esophageal Phase WFL Pudding Teaspoon -- Pudding Cup -- Honey Teaspoon -- Honey Cup -- Nectar Teaspoon -- Nectar Cup -- Nectar Straw -- Thin Teaspoon -- Thin Cup -- Thin Straw -- Puree -- Mechanical Soft -- Regular -- Multi-consistency -- Pill -- Cervical Esophageal Comment -- [name], M.A. CCC-SLP [phone] [name] 05/28/2015, 2:11 PM  ASSESSMENT AND PLAN 1. Acute stroke - occurred in the setting of subtherapeutic INR (1.6) and also moderate-severe cerebrovascular disease in this territory by imaging.  Starting on Xarelto this evening.  Consider stopping aspirin tomorrow.  2. Chest pain with elevated troponin - unclear if this represents elevated value in the setting of stroke versus an ACS. It is impossible to elicit an adequate history of her chest pain, although she does have significant tenderness to palpation which would indicate a MSK etiology. EKG is normal.. No RWMA by echo. She does not exhibit any signs of acute PE including tachypnea, dyspnea, hypoxia, or tachycardia. She is currently being covered with aspirin and heparin. Her aphasia and acute stroke make her a poor candidate for cath at present time.   3. HTN - being managed by IM/neuro in the context of permissive HTN.  4.  Hyperkalemia then hypokalemia - per IM.  5. Mild AS by echo - follow clinically for now.  Signed, Warren Danes MD

## 2015-05-29 NOTE — Progress Notes (Signed)
Subjective: Patient was seen and examined at bedside this morning. She has expressive aphasia but is able to understand what is said to her. She is following verbal commands. When asked if she is still having chest pain, patient nodded yes. Unfortunately, she recently suffered a CVA causing significant expressive aphasia so it is difficult to illicit a good history of the chest pain. She can answer yes/no questions.   Objective: Vital signs in last 24 hours: Filed Vitals:   05/29/15 0140 05/29/15 0600 05/29/15 0842 05/29/15 1017  BP: 139/64 150/70  135/66  Pulse: 72 73  72  Temp: 98.3 F (36.8 C) 97.7 F (36.5 C)  98.3 F (36.8 C)  TempSrc: Oral Axillary  Oral  Resp: 18 18  20   Height:      Weight:      SpO2: 100% 100% 98% 100%   Weight change:  No intake or output data in the 24 hours ending 05/29/15 1138   Physical Exam  Constitutional: Obese female. She appears well-developed and well-nourished. No acute distress.  Eyes: EOM are normal.  Cardiovascular: Normal rate, regular rhythm and intact distal pulses.Chest wall is tender to palpation.  Pulmonary/Chest: Effort normal and breath sounds normal. No respiratory distress. She has no wheezes, rales, or rhonchi.  Abdominal: Soft. Bowel sounds are normal. She exhibits no distension. There is no tenderness.  Musculoskeletal: She exhibits no edema.  Neurological: She is alert.  Dysarthria. Expressive aphasia. Patient was following commands. Right-sided facial droop noted. Strength 3 /5 and no sensation in right upper extremity. Strength and sensation intact in the left upper extremity. It was difficult to assess strength and sensation in the patient's lower extremities.  Skin: Skin is warm and dry.   Lab Results: Basic Metabolic Panel:  Recent Labs Lab 05/28/15 0603 05/28/15 1041 05/29/15 0528  NA 137  --  137  K 3.4*  --  3.6  CL 103  --  103  CO2 25  --  27  GLUCOSE 98  --  114*  BUN <5*  --  <5*  CREATININE  0.64  --  0.57  CALCIUM 8.4*  --  8.6*  MG  --  2.0  --    Liver Function Tests:  Recent Labs Lab 05/26/15 0929  AST 24  ALT 13*  ALKPHOS 85  BILITOT 1.0  PROT 7.5  ALBUMIN 3.7   CBC:  Recent Labs Lab 05/26/15 0929  05/28/15 0603 05/29/15 0528  WBC 6.5  < > 6.3 5.7  NEUTROABS 3.4  --   --   --   HGB 15.2*  < > 13.2 12.8  HCT 47.7*  < > 40.7 38.7  MCV 100.6*  < > 98.1 98.2  PLT 215  < > 191 164  < > = values in this interval not displayed. CBG:  Recent Labs Lab 05/26/15 0859  GLUCAP 85   Fasting Lipid Panel:  Recent Labs Lab 05/27/15 0110  CHOL 139  HDL 54  LDLCALC 73  TRIG 62  CHOLHDL 2.6   Coagulation:  Recent Labs Lab 05/26/15 1145  LABPROT 19.1*  INR 1.61*   Anemia Panel: No results for input(s): VITAMINB12, FOLATE, FERRITIN, TIBC, IRON, RETICCTPCT in the last 168 hours. Urine Drug Screen: Drugs of Abuse     Component Value Date/Time   LABOPIA NONE DETECTED 05/26/2015 1320   COCAINSCRNUR NONE DETECTED 05/26/2015 1320   LABBENZ NONE DETECTED 05/26/2015 1320   AMPHETMU NONE DETECTED 05/26/2015 1320   THCU NONE DETECTED  05/26/2015 1320   LABBARB NONE DETECTED 05/26/2015 1320    Alcohol Level:  Recent Labs Lab 05/26/15 0929  ETH <5   Urinalysis:  Recent Labs Lab 05/26/15 1319  COLORURINE YELLOW  LABSPEC >1.046*  PHURINE 5.5  GLUCOSEU NEGATIVE  HGBUR NEGATIVE  BILIRUBINUR NEGATIVE  KETONESUR NEGATIVE  PROTEINUR NEGATIVE  NITRITE NEGATIVE  LEUKOCYTESUR NEGATIVE   Micro Results: No results found for this or any previous visit (from the past 240 hour(s)). Studies/Results: Mr Virgel Paling Wo Contrast  05/27/2015  CLINICAL DATA:  Left MCA territory infarct. Abnormal speech. Facial droop. The examination had to be discontinued prior to completion due to patient unable to tolerate despite medication. EXAM: MRI HEAD WITHOUT CONTRAST MRA HEAD WITHOUT CONTRAST TECHNIQUE: Multiplanar, multiecho pulse sequences of the brain and  surrounding structures were obtained without intravenous contrast. Angiographic images of the head were obtained using MRA technique without contrast. COMPARISON:  CTA head and neck, CT perfusion, and CT head 05/26/2015 FINDINGS: MRI HEAD FINDINGS The diffusion-weighted images confirm an acute infarct involving the posterior aspect of the left MCA territory. The posterior tooth there is the left insular cortex are involved. There is involvement of the posterior left frontal lobe, left parietal lobe, in the superior left temporal lobe. There is significant patient motion which could cause some artifact on the susceptibility weighted imaging. However, petechial hemorrhages suspected within the left temporal and parietal lobe infarct. MRA HEAD FINDINGS The MRA is also distorted by patient motion. Internal carotid arteries are within normal limits from the high cervical segments through the ICA termini bilaterally. The A1 and M1 segments are intact. The MCA bifurcations are intact. There is significant attenuation of MCA branch vessels bilaterally, distorted by patient motion. The left vertebral artery is the dominant vessel. The PICA origins are visualized and normal bilaterally. The basilar artery is within normal limits. Both posterior cerebral arteries originate from basilar tip. The posterior cerebral arteries are within normal limits bilaterally. IMPRESSION: 1. Confirmation of an acute infarct involving the posterior left MCA territory. 2. Probable petechial hemorrhage within the infarct. 3. Significant attenuation of proximal MCA branch vessels bilaterally without a more proximal stenosis or occlusion. 4. The study is moderately degraded by patient motion, limiting evaluation of medium and distal small vessels. Electronically Signed   By: San Morelle M.D.   On: 05/27/2015 12:03   Mr Brain Wo Contrast  05/27/2015  CLINICAL DATA:  Left MCA territory infarct. Abnormal speech. Facial droop. The  examination had to be discontinued prior to completion due to patient unable to tolerate despite medication. EXAM: MRI HEAD WITHOUT CONTRAST MRA HEAD WITHOUT CONTRAST TECHNIQUE: Multiplanar, multiecho pulse sequences of the brain and surrounding structures were obtained without intravenous contrast. Angiographic images of the head were obtained using MRA technique without contrast. COMPARISON:  CTA head and neck, CT perfusion, and CT head 05/26/2015 FINDINGS: MRI HEAD FINDINGS The diffusion-weighted images confirm an acute infarct involving the posterior aspect of the left MCA territory. The posterior tooth there is the left insular cortex are involved. There is involvement of the posterior left frontal lobe, left parietal lobe, in the superior left temporal lobe. There is significant patient motion which could cause some artifact on the susceptibility weighted imaging. However, petechial hemorrhages suspected within the left temporal and parietal lobe infarct. MRA HEAD FINDINGS The MRA is also distorted by patient motion. Internal carotid arteries are within normal limits from the high cervical segments through the ICA termini bilaterally. The A1 and M1 segments are  intact. The MCA bifurcations are intact. There is significant attenuation of MCA branch vessels bilaterally, distorted by patient motion. The left vertebral artery is the dominant vessel. The PICA origins are visualized and normal bilaterally. The basilar artery is within normal limits. Both posterior cerebral arteries originate from basilar tip. The posterior cerebral arteries are within normal limits bilaterally. IMPRESSION: 1. Confirmation of an acute infarct involving the posterior left MCA territory. 2. Probable petechial hemorrhage within the infarct. 3. Significant attenuation of proximal MCA branch vessels bilaterally without a more proximal stenosis or occlusion. 4. The study is moderately degraded by patient motion, limiting evaluation of  medium and distal small vessels. Electronically Signed   By: San Morelle M.D.   On: 05/27/2015 12:03   Dg Swallowing Func-speech Pathology  05/28/2015  Objective Swallowing Evaluation:   Patient Details Name: SEETA RAYMON MRN: [id number] Date of Birth: 05-18-1956 Today's Date: 05/28/2015 Time: SLP Start Time (ACUTE ONLY): 1321-SLP Stop Time (ACUTE ONLY): 1335 SLP Time Calculation (min) (ACUTE ONLY): 14 min Past Medical History: Past Medical History Diagnosis Date . Hypertension  . Asthma  . COPD (chronic obstructive pulmonary disease) ([place])  . Anxiety  . Colon cancer ([place])    a. s/p surgery, chemotherapy. . Dyslipidemia  . Migraine headache  . Chronic bronchitis  . Uterine fibroid  . Ovarian cyst  . GERD (gastroesophageal reflux disease)  . Morbid obesity ([place])  . Obstructive sleep apnea    mild . Chronic pain  . Chronic back pain  . Arthritis  . Osteoarthritis  . Depression  . DVT (deep venous thrombosis) ([place]) 02/2014 . 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 PCCM recommended lifelong anticoagulation. . Tobacco abuse  . PVC's (premature ventricular contractions) 2013 . Aortic stenosis    a. mild by echo 05/2015. Past Surgical History: Past Surgical History Procedure Laterality Date . Knee arthroscopy     bil. . Carpal tunnel release     rt . Mouth surgery     teeth extraction . Cesarean section   . Subtotal colectomy   . Colonoscopy   . Tubal ligation   HPI: 59 y.o. female brought in with acute receptive>>expressive aphasia. Woke up with stroke, with CT already showing question of early ischemia in distribution of the left middle cerebral artery.  Subjective: pt expressively aphasic Assessment / Plan / Recommendation CHL IP CLINICAL IMPRESSIONS 05/28/2015 Therapy Diagnosis Moderate oral phase dysphagia Clinical Impression Pt has a moderate oral dysphagia, although has good strength and timing of her pharyngeal phase. She has prolonged oral preparation and mastication, with small amounts of oral  residue remaining after the swallow. Min cues provided for continued mastication with soft solid. Recommend initiation of Dys 1 textures and thin liquids, meds crushed in puree. Impact on safety and function Mild aspiration risk   CHL IP TREATMENT RECOMMENDATION 05/28/2015 Treatment Recommendations Therapy as outlined in treatment plan below   Prognosis 05/28/2015 Prognosis for Safe Diet Advancement Good Barriers to Reach Goals -- Barriers/Prognosis Comment -- CHL IP DIET RECOMMENDATION 05/28/2015 SLP Diet Recommendations Dysphagia 1 (Puree) solids;Thin liquid Liquid Administration via Cup;Straw Medication Administration Crushed with puree Compensations Minimize environmental distractions;Slow rate;Small sips/bites;Lingual sweep for clearance of pocketing Postural Changes Seated upright at 90 degrees   CHL IP OTHER RECOMMENDATIONS 05/28/2015 Recommended Consults -- Oral Care Recommendations Oral care BID;Oral care before and after PO Other Recommendations --   CHL IP FOLLOW UP RECOMMENDATIONS 05/28/2015 Follow up Recommendations Inpatient Rehab   CHL IP FREQUENCY AND DURATION 05/28/2015 Speech  Therapy Frequency (ACUTE ONLY) min 2x/week Treatment Duration 2 weeks      CHL IP ORAL PHASE 05/28/2015 Oral Phase Impaired Oral - Pudding Teaspoon -- Oral - Pudding Cup -- Oral - Honey Teaspoon -- Oral - Honey Cup -- Oral - Nectar Teaspoon -- Oral - Nectar Cup -- Oral - Nectar Straw -- Oral - Thin Teaspoon -- Oral - Thin Cup Weak lingual manipulation;Reduced posterior propulsion;Lingual/palatal residue Oral - Thin Straw Weak lingual manipulation;Reduced posterior propulsion;Lingual/palatal residue Oral - Puree Weak lingual manipulation;Reduced posterior propulsion;Lingual/palatal residue Oral - Mech Soft Weak lingual manipulation;Reduced posterior propulsion;Lingual/palatal residue;Impaired mastication Oral - Regular -- Oral - Multi-Consistency -- Oral - Pill -- Oral Phase - Comment --  CHL IP PHARYNGEAL PHASE 05/28/2015  Pharyngeal Phase WFL Pharyngeal- Pudding Teaspoon -- Pharyngeal -- Pharyngeal- Pudding Cup -- Pharyngeal -- Pharyngeal- Honey Teaspoon -- Pharyngeal -- Pharyngeal- Honey Cup -- Pharyngeal -- Pharyngeal- Nectar Teaspoon -- Pharyngeal -- Pharyngeal- Nectar Cup -- Pharyngeal -- Pharyngeal- Nectar Straw -- Pharyngeal -- Pharyngeal- Thin Teaspoon -- Pharyngeal -- Pharyngeal- Thin Cup -- Pharyngeal -- Pharyngeal- Thin Straw -- Pharyngeal -- Pharyngeal- Puree -- Pharyngeal -- Pharyngeal- Mechanical Soft -- Pharyngeal -- Pharyngeal- Regular -- Pharyngeal -- Pharyngeal- Multi-consistency -- Pharyngeal -- Pharyngeal- Pill -- Pharyngeal -- Pharyngeal Comment --  CHL IP CERVICAL ESOPHAGEAL PHASE 05/28/2015 Cervical Esophageal Phase WFL Pudding Teaspoon -- Pudding Cup -- Honey Teaspoon -- Honey Cup -- Nectar Teaspoon -- Nectar Cup -- Nectar Straw -- Thin Teaspoon -- Thin Cup -- Thin Straw -- Puree -- Mechanical Soft -- Regular -- Multi-consistency -- Pill -- Cervical Esophageal Comment -- [name], M.A. CCC-SLP [phone] [name] 05/28/2015, 2:11 PM              Medications: I have reviewed the patient's current medications. Scheduled Meds: . antiseptic oral rinse  7 mL Mouth Rinse q12n4p  . aspirin  300 mg Rectal Daily  . atorvastatin  40 mg Oral q1800  . chlorhexidine  15 mL Mouth Rinse BID  . pantoprazole (PROTONIX) IV  40 mg Intravenous Daily  . rivaroxaban  20 mg Oral Q supper  . sodium chloride  10-40 mL Intracatheter Q12H  . sodium chloride  3 mL Intravenous Q12H   Continuous Infusions: . dextrose 5 % and 0.9 % NaCl with KCl 20 mEq/L 75 mL/hr (05/29/15 0626)   PRN Meds:.acetaminophen, ipratropium-albuterol, LORazepam, morphine injection, ondansetron **OR** ondansetron (ZOFRAN) IV, sodium chloride, sodium chloride Assessment/Plan: Active Problems:   Hypokalemia   Chest pain   Stroke (HCC)   Acute ischemic stroke (HCC)   Aphasia   Weakness   Elevated troponin   Essential  hypertension   Hyperkalemia   History of pulmonary embolism   History of DVT (deep vein thrombosis)   Global aphasia   Stroke with cerebral ischemia (HCC)   Chronic obstructive pulmonary disease (HCC)   Hx of migraines   OSA (obstructive sleep apnea)   Hemiparesis, aphasia, and dysphagia as late effect of cerebrovascular accident (CVA) ([place])  Acute ischemic stroke Patient is presenting with acute onset right-sided weakness, right facial droop, dysarthria, and expressive aphasia secondary to an ischemic stroke involving a large area of the posterior aspect of the left MCA territory. Echo showing normal LV systolic function with an ejection fraction of 50-55% and no cardiac source of emboli. Carotid dopplers showing intimal wall thickening CCA. Mild mixed plaque origin ICA. 1-39% ICA plaquing. Vertebral artery flow is antegrade.Patient has been started on Aspirin and a statin. I spoke to Dr. Leonie Man  from Neurology and he is recommending ordering an TEE to rule out PFO since patient had an embolic stroke. However, a loop recorder to look for A-fib will be less useful because patient is already on anticoagulation with Coumadin. Cardiology believes at this point there is not a clear indication to proceed with TEE since she does need to be on lifelong anticoagulation for pulmonary indications of previous unprovoked DVT and bilateral pulmonary emboli. In addition, there is nothing in the history to suggest endocarditis. PT, OT, and SLP are recommending inpatient rehab.  -Monitor on telemetry -Appreciate neurology recommendations. -Appreciate cardiology recommendations  -Aspirin 300 mg rectal suppository daily. Will stop tomorrow.  -Ativan 1 mg every 6 hours as needed for anxiety -Lipitor 40 mg daily for secondary prevention  -Neuro checks every 2 hours -CIR has been consulted.   Chest pain Patient seems to be complaining of chest pain. Unfortunately, she recently suffered a CVA causing significant  expressive aphasia so it is difficult to illicit a good history of the chest pain. Patient has no prior history of CAD or cath that can be seen in her records, but does have significant risk factors, Heart Score 5 putting her at moderate risk of ACS.  Echo shows EF of 50-55%. EKG normal without ischemic changes. First troponin normal. Peak troponin 0.29, now trending down (0.18). There is no good explanation of her elevated troponin- no obvious causes of demand ischemia. Elevated troponin could likely be due to necrotic tissue in brain from recent ischemic stroke. PE is less likely as patient is not tachycardic and satting well on room air and has been on heparin for history of DVT. Chest pain could be possibly due to GERD. Musculoskeletal pain is also on the differential because patient did grimace when her chest wall was palpated. Cardiology saw the patient and believe she is a poor candidate for cath at this time because of her aphasia and acute stroke; recommending conservative mgmt.  -Appreciate cardiology recommendations -Discontinue Heparin  -Cont ASA today and discontinue tomorrow morning -Cont IV protonix -Tylenol for headache/ musculoskeletal pain  -Continue morphine prn  History of unprovoked DVT and PE: [facility] 2015 indicate patient had an unprovoked DVT and PE at that time and was started on lifelong anticoagulation with Coumadin.  -Stop home medications Coumadin  -Discontinue Heparin -Started Xarelto 20 mg QD today   Hyperkalemia: Resolved. Potassium 5.6 on admission likely due to being on potassium supplement (patient was taking Lasix at home). K 3.6 today. -continue to hold Lasix  -Dextrose 5%-NS-KCl 64meq @ 77 cc/hr   COPD: Lung exam improved; no wheezes heard.  -DuoNeb every 6 hours  Hypertension: Hold home antihypertensive (Lasix) to allow for permissive hypertension.   Hyperlipidemia: Patient is not on a statin at home. Lipid panel showing cholesterol  139, LDL 73, and HDL 54. -Lipitor 40 mg daily for secondary prevention  Diet: Dysphagia 1 diet as recommended by SLP  DVT prophylaxis: Xarelto   Code: Full  Dispo: Disposition is deferred at this time, awaiting improvement of current medical problems.  Anticipated discharge in approximately 2-3 day(s).   The patient does have a current PCP (Lorayne Marek, MD) and does need an [facility] hospital follow-up appointment after discharge.  The patient does not have transportation limitations that hinder transportation to clinic appointments.  .Services Needed at time of discharge: Y = Yes, Blank = No PT:   OT:   RN:   Equipment:   Other:     LOS:  3 days   Shela Leff, MD 05/29/2015, 11:38 AM

## 2015-05-29 NOTE — Progress Notes (Signed)
Speech Language Pathology Treatment: Dysphagia;Cognitive-Linquistic  Patient Details [name] MRN: [id number] DOB: 1955/08/04 Today's Date: 05/29/2015 Time: 1050-1105 SLP Time Calculation (min) (ACUTE ONLY): 15 min  Assessment / Plan / Recommendation Clinical Impression  Pt demonstrates immedaite cough following sips of water, inconsistent with finding of excellent airway protection with thin liquids on MBS. Max verbal cues provided for closer approximation of phonemes at word level - name counting. Pt able to repeat words with max cues but easily becomes perseverative. Also observed to have approximately 50 % accuracy with Y/N and single commands with basic language; required max visual and tactile cues for accuracy. Plan to return in PM to reassess with liquids as session ended prior to completion.    HPI HPI: 59 y.o. female brought in with acute receptive>>expressive aphasia. Woke up with stroke, with CT already showing question of early ischemia in distribution of the left middle cerebral artery.       SLP Plan  MBS     Recommendations  Diet recommendations: Thin liquid;Dysphagia 1 (puree) Liquids provided via: Cup Medication Administration: Crushed with puree Supervision: Staff to assist with self feeding Compensations: Minimize environmental distractions;Slow rate;Small sips/bites;Lingual sweep for clearance of pocketing Postural Changes and/or Swallow Maneuvers: Seated upright 90 degrees              Oral Care Recommendations: Oral care BID Follow up Recommendations: Inpatient Rehab Plan: MBS   Shaili Donalson, Katherene Ponto 05/29/2015, 12:09 PM

## 2015-05-30 ENCOUNTER — Inpatient Hospital Stay (HOSPITAL_COMMUNITY)
Admission: RE | Admit: 2015-05-30 | Discharge: 2015-06-08 | DRG: 065 | Disposition: A | Discharge status: Home Health | Payer: Medicare HMO | Source: Intra-hospital | Attending: Physical Medicine & Rehabilitation | Admitting: Physical Medicine & Rehabilitation

## 2015-05-30 DIAGNOSIS — R4702 Dysphasia: Secondary | ICD-10-CM | POA: Diagnosis not present

## 2015-05-30 DIAGNOSIS — F419 Anxiety disorder, unspecified: Secondary | ICD-10-CM | POA: Diagnosis present

## 2015-05-30 DIAGNOSIS — M549 Dorsalgia, unspecified: Secondary | ICD-10-CM | POA: Diagnosis present

## 2015-05-30 DIAGNOSIS — K5901 Slow transit constipation: Secondary | ICD-10-CM | POA: Diagnosis not present

## 2015-05-30 DIAGNOSIS — Z86711 Personal history of pulmonary embolism: Secondary | ICD-10-CM

## 2015-05-30 DIAGNOSIS — G894 Chronic pain syndrome: Secondary | ICD-10-CM

## 2015-05-30 DIAGNOSIS — I10 Essential (primary) hypertension: Secondary | ICD-10-CM | POA: Diagnosis present

## 2015-05-30 DIAGNOSIS — J449 Chronic obstructive pulmonary disease, unspecified: Secondary | ICD-10-CM | POA: Diagnosis present

## 2015-05-30 DIAGNOSIS — R471 Dysarthria and anarthria: Secondary | ICD-10-CM | POA: Diagnosis present

## 2015-05-30 DIAGNOSIS — E785 Hyperlipidemia, unspecified: Secondary | ICD-10-CM | POA: Diagnosis present

## 2015-05-30 DIAGNOSIS — I63512 Cerebral infarction due to unspecified occlusion or stenosis of left middle cerebral artery: Secondary | ICD-10-CM | POA: Diagnosis present

## 2015-05-30 DIAGNOSIS — G4733 Obstructive sleep apnea (adult) (pediatric): Secondary | ICD-10-CM | POA: Diagnosis present

## 2015-05-30 DIAGNOSIS — R7989 Other specified abnormal findings of blood chemistry: Secondary | ICD-10-CM

## 2015-05-30 DIAGNOSIS — K219 Gastro-esophageal reflux disease without esophagitis: Secondary | ICD-10-CM | POA: Diagnosis present

## 2015-05-30 DIAGNOSIS — I69391 Dysphagia following cerebral infarction: Secondary | ICD-10-CM | POA: Diagnosis not present

## 2015-05-30 DIAGNOSIS — G8929 Other chronic pain: Secondary | ICD-10-CM | POA: Diagnosis present

## 2015-05-30 DIAGNOSIS — F329 Major depressive disorder, single episode, unspecified: Secondary | ICD-10-CM | POA: Diagnosis present

## 2015-05-30 DIAGNOSIS — J45909 Unspecified asthma, uncomplicated: Secondary | ICD-10-CM | POA: Diagnosis present

## 2015-05-30 DIAGNOSIS — Z86718 Personal history of other venous thrombosis and embolism: Secondary | ICD-10-CM

## 2015-05-30 DIAGNOSIS — I69320 Aphasia following cerebral infarction: Secondary | ICD-10-CM

## 2015-05-30 DIAGNOSIS — R482 Apraxia: Secondary | ICD-10-CM | POA: Diagnosis present

## 2015-05-30 DIAGNOSIS — R739 Hyperglycemia, unspecified: Secondary | ICD-10-CM

## 2015-05-30 DIAGNOSIS — R4701 Aphasia: Secondary | ICD-10-CM | POA: Diagnosis not present

## 2015-05-30 DIAGNOSIS — F32A Depression, unspecified: Secondary | ICD-10-CM | POA: Diagnosis present

## 2015-05-30 DIAGNOSIS — Z8669 Personal history of other diseases of the nervous system and sense organs: Secondary | ICD-10-CM

## 2015-05-30 DIAGNOSIS — Z720 Tobacco use: Secondary | ICD-10-CM | POA: Diagnosis present

## 2015-05-30 DIAGNOSIS — Z6841 Body Mass Index (BMI) 40.0 and over, adult: Secondary | ICD-10-CM

## 2015-05-30 DIAGNOSIS — I69359 Hemiplegia and hemiparesis following cerebral infarction affecting unspecified side: Secondary | ICD-10-CM

## 2015-05-30 LAB — CBC
HCT: 41.6 % (ref 36.0–46.0)
Hemoglobin: 13.7 g/dL (ref 12.0–15.0)
MCH: 32.4 pg (ref 26.0–34.0)
MCHC: 32.9 g/dL (ref 30.0–36.0)
MCV: 98.3 fL (ref 78.0–100.0)
PLATELETS: 160 10*3/uL (ref 150–400)
RBC: 4.23 MIL/uL (ref 3.87–5.11)
RDW: 13.7 % (ref 11.5–15.5)
WBC: 5.6 10*3/uL (ref 4.0–10.5)

## 2015-05-30 LAB — BASIC METABOLIC PANEL
ANION GAP: 7 (ref 5–15)
BUN: <5 mg/dL — ABNORMAL LOW (ref 6–20)
CALCIUM: 9 mg/dL (ref 8.9–10.3)
CO2: 28 mmol/L (ref 22–32)
Chloride: 103 mmol/L (ref 101–111)
Creatinine, Ser: 0.65 mg/dL (ref 0.44–1.00)
GLUCOSE: 92 mg/dL (ref 65–99)
POTASSIUM: 3.5 mmol/L (ref 3.5–5.1)
Sodium: 138 mmol/L (ref 135–145)

## 2015-05-30 MED ORDER — FLEET ENEMA 7-19 GM/118ML RE ENEM: 1.0000 | ENEMA | Freq: Once | RECTAL | Status: DC | PRN

## 2015-05-30 MED ORDER — ATORVASTATIN CALCIUM 40 MG PO TABS
40.0000 mg | ORAL_TABLET | Freq: Every day | ORAL | Status: DC
  Administered 2015-05-31 – 2015-06-07 (×8): 40 mg via ORAL
  Filled 2015-05-30 (×8): qty 1

## 2015-05-30 MED ORDER — RIVAROXABAN 20 MG PO TABS
20.0000 mg | ORAL_TABLET | Freq: Every day | ORAL | Status: DC
  Administered 2015-05-31 – 2015-06-07 (×8): 20 mg via ORAL
  Filled 2015-05-30 (×8): qty 1

## 2015-05-30 MED ORDER — ATORVASTATIN CALCIUM 40 MG PO TABS: 40.0000 mg | ORAL_TABLET | Freq: Every day | ORAL | Status: DC

## 2015-05-30 MED ORDER — CITALOPRAM HYDROBROMIDE 20 MG PO TABS
20.0000 mg | ORAL_TABLET | Freq: Every day | ORAL | Status: DC
  Administered 2015-05-31 – 2015-06-08 (×9): 20 mg via ORAL
  Filled 2015-05-30 (×9): qty 1

## 2015-05-30 MED ORDER — ONDANSETRON HCL 4 MG PO TABS: 4.0000 mg | ORAL_TABLET | Freq: Four times a day (QID) | ORAL | Status: DC | PRN

## 2015-05-30 MED ORDER — GABAPENTIN 300 MG PO CAPS: 300.0000 mg | ORAL_CAPSULE | Freq: Every day | ORAL | Status: DC

## 2015-05-30 MED ORDER — ONDANSETRON HCL 4 MG/2ML IJ SOLN: 4.0000 mg | Freq: Four times a day (QID) | INTRAMUSCULAR | Status: DC | PRN

## 2015-05-30 MED ORDER — LORAZEPAM 1 MG PO TABS
1.0000 mg | ORAL_TABLET | Freq: Two times a day (BID) | ORAL | Status: DC | PRN
  Administered 2015-05-30 – 2015-06-07 (×11): 1 mg via ORAL
  Filled 2015-05-30 (×7): qty 1
  Filled 2015-05-30: qty 2
  Filled 2015-05-30 (×3): qty 1
  Filled 2015-05-30: qty 2

## 2015-05-30 MED ORDER — POTASSIUM CHLORIDE CRYS ER 20 MEQ PO TBCR
20.0000 meq | EXTENDED_RELEASE_TABLET | Freq: Every day | ORAL | Status: DC
  Administered 2015-05-30: 20 meq via ORAL
  Filled 2015-05-30: qty 1

## 2015-05-30 MED ORDER — OXYCODONE-ACETAMINOPHEN 7.5-325 MG PO TABS
1.0000 | ORAL_TABLET | Freq: Four times a day (QID) | ORAL | Status: DC | PRN
  Administered 2015-05-30: 1 via ORAL
  Filled 2015-05-30: qty 1

## 2015-05-30 MED ORDER — SODIUM CHLORIDE 0.9 % IJ SOLN
10.0000 mL | INTRAMUSCULAR | Status: DC | PRN
  Administered 2015-05-30 – 2015-06-03 (×2): 10 mL
  Administered 2015-06-04 – 2015-06-06 (×2): 20 mL
  Filled 2015-05-30 (×4): qty 40

## 2015-05-30 MED ORDER — ACETAMINOPHEN 325 MG PO TABS
325.0000 mg | ORAL_TABLET | ORAL | Status: DC | PRN
  Administered 2015-05-30 – 2015-06-07 (×7): 650 mg via ORAL
  Filled 2015-05-30 (×7): qty 2

## 2015-05-30 MED ORDER — CHLORHEXIDINE GLUCONATE 0.12 % MT SOLN
15.0000 mL | Freq: Four times a day (QID) | OROMUCOSAL | Status: DC
  Administered 2015-05-31 – 2015-06-08 (×15): 15 mL via OROMUCOSAL
  Filled 2015-05-30 (×21): qty 15

## 2015-05-30 MED ORDER — GUAIFENESIN-DM 100-10 MG/5ML PO SYRP: 5.0000 mL | ORAL_SOLUTION | Freq: Four times a day (QID) | ORAL | Status: DC | PRN

## 2015-05-30 MED ORDER — LORAZEPAM 1 MG PO TABS
1.0000 mg | ORAL_TABLET | Freq: Two times a day (BID) | ORAL | Status: DC | PRN
  Administered 2015-05-30: 1 mg via ORAL
  Filled 2015-05-30: qty 1

## 2015-05-30 MED ORDER — PROCHLORPERAZINE EDISYLATE 5 MG/ML IJ SOLN: 5.0000 mg | Freq: Four times a day (QID) | INTRAMUSCULAR | Status: DC | PRN

## 2015-05-30 MED ORDER — HYDROCHLOROTHIAZIDE 12.5 MG PO TABS: 12.5000 mg | ORAL_TABLET | Freq: Every day | ORAL | Status: DC

## 2015-05-30 MED ORDER — DIPHENHYDRAMINE HCL 12.5 MG/5ML PO ELIX: 12.5000 mg | ORAL_SOLUTION | Freq: Four times a day (QID) | ORAL | Status: DC | PRN

## 2015-05-30 MED ORDER — IPRATROPIUM-ALBUTEROL 0.5-2.5 (3) MG/3ML IN SOLN
3.0000 mL | Freq: Four times a day (QID) | RESPIRATORY_TRACT | Status: DC | PRN
Start: 2015-05-30 — End: 2015-06-08

## 2015-05-30 MED ORDER — PROCHLORPERAZINE MALEATE 5 MG PO TABS: 5.0000 mg | ORAL_TABLET | Freq: Four times a day (QID) | ORAL | Status: DC | PRN

## 2015-05-30 MED ORDER — PANTOPRAZOLE SODIUM 40 MG PO TBEC
40.0000 mg | DELAYED_RELEASE_TABLET | Freq: Every day | ORAL | Status: DC
  Administered 2015-05-30: 40 mg via ORAL
  Filled 2015-05-30: qty 1

## 2015-05-30 MED ORDER — CETYLPYRIDINIUM CHLORIDE 0.05 % MT LIQD: 7.0000 mL | Freq: Two times a day (BID) | OROMUCOSAL | Status: DC

## 2015-05-30 MED ORDER — ALUM & MAG HYDROXIDE-SIMETH 200-200-20 MG/5ML PO SUSP
30.0000 mL | ORAL | Status: DC | PRN
  Administered 2015-06-02 – 2015-06-04 (×2): 30 mL via ORAL
  Filled 2015-05-30 (×2): qty 30

## 2015-05-30 MED ORDER — PANTOPRAZOLE SODIUM 40 MG PO TBEC
40.0000 mg | DELAYED_RELEASE_TABLET | Freq: Every day | ORAL | Status: DC
  Administered 2015-05-31 – 2015-06-08 (×9): 40 mg via ORAL
  Filled 2015-05-30 (×9): qty 1

## 2015-05-30 MED ORDER — RIVAROXABAN 20 MG PO TABS: 20.0000 mg | ORAL_TABLET | Freq: Every day | ORAL | Status: DC

## 2015-05-30 MED ORDER — POTASSIUM CHLORIDE CRYS ER 20 MEQ PO TBCR
20.0000 meq | EXTENDED_RELEASE_TABLET | Freq: Every day | ORAL | Status: DC
  Administered 2015-05-31 – 2015-06-08 (×9): 20 meq via ORAL
  Filled 2015-05-30 (×9): qty 1

## 2015-05-30 MED ORDER — CITALOPRAM HYDROBROMIDE 10 MG PO TABS
20.0000 mg | ORAL_TABLET | Freq: Every day | ORAL | Status: DC
  Administered 2015-05-30: 20 mg via ORAL
  Filled 2015-05-30: qty 2

## 2015-05-30 MED ORDER — PROCHLORPERAZINE 25 MG RE SUPP
12.5000 mg | Freq: Four times a day (QID) | RECTAL | Status: DC | PRN
Start: 2015-05-30 — End: 2015-06-08

## 2015-05-30 MED ORDER — CHLORHEXIDINE GLUCONATE 0.12 % MT SOLN: 15.0000 mL | Freq: Two times a day (BID) | OROMUCOSAL | Status: DC

## 2015-05-30 MED ORDER — GABAPENTIN 300 MG PO CAPS
300.0000 mg | ORAL_CAPSULE | Freq: Every day | ORAL | Status: DC
  Administered 2015-05-30 – 2015-05-31 (×2): 300 mg via ORAL
  Filled 2015-05-30 (×2): qty 1

## 2015-05-30 MED ORDER — HYDROCODONE-ACETAMINOPHEN 5-325 MG PO TABS
1.0000 | ORAL_TABLET | Freq: Four times a day (QID) | ORAL | Status: DC | PRN
  Administered 2015-05-30 – 2015-06-03 (×10): 2 via ORAL
  Administered 2015-06-03: 1 via ORAL
  Administered 2015-06-03 – 2015-06-07 (×12): 2 via ORAL
  Filled 2015-05-30 (×23): qty 2

## 2015-05-30 MED ORDER — BISACODYL 10 MG RE SUPP: 10.0000 mg | Freq: Every day | RECTAL | Status: DC | PRN

## 2015-05-30 MED ORDER — AMITRIPTYLINE HCL 25 MG PO TABS: 50.0000 mg | ORAL_TABLET | Freq: Every day | ORAL | Status: DC

## 2015-05-30 MED ORDER — AMITRIPTYLINE HCL 25 MG PO TABS
25.0000 mg | ORAL_TABLET | Freq: Every day | ORAL | Status: DC
  Administered 2015-05-30 – 2015-06-07 (×9): 25 mg via ORAL
  Filled 2015-05-30 (×9): qty 1

## 2015-05-30 MED ORDER — PANTOPRAZOLE SODIUM 40 MG PO TBEC: 40.0000 mg | DELAYED_RELEASE_TABLET | Freq: Every day | ORAL | Status: DC

## 2015-05-30 NOTE — Progress Notes (Signed)
Patient [name] Date of Encounter: 05/30/2015     Active Problems:   Hypokalemia   Chest pain   Stroke [facility])   Acute ischemic stroke (HCC)   Aphasia   Weakness   Elevated troponin   Essential hypertension   Hyperkalemia   History of pulmonary embolism   History of DVT (deep vein thrombosis)   Global aphasia   Stroke with cerebral ischemia (HCC)   Chronic obstructive pulmonary disease (HCC)   Hx of migraines   OSA (obstructive sleep apnea)   Hemiparesis, aphasia, and dysphagia as late effect of cerebrovascular accident (CVA) ([place])    SUBJECTIVE  Still tender in left upper chest. No dyspnea on room air. No edema.  CURRENT MEDS . amitriptyline  50 mg Oral QHS  . antiseptic oral rinse  7 mL Mouth Rinse q12n4p  . atorvastatin  40 mg Oral q1800  . chlorhexidine  15 mL Mouth Rinse BID  . citalopram  20 mg Oral Daily  . gabapentin  300 mg Oral QHS  . pantoprazole  40 mg Oral Daily  . potassium chloride SA  20 mEq Oral Daily  . rivaroxaban  20 mg Oral Q supper  . sodium chloride  10-40 mL Intracatheter Q12H  . sodium chloride  3 mL Intravenous Q12H    OBJECTIVE  Filed Vitals:   05/29/15 1812 05/29/15 2145 05/30/15 0141 05/30/15 0555  BP: 161/72 130/68 117/73 141/73  Pulse: 73 80 72 67  Temp: 98.6 F (37 C) 97.7 F (36.5 C) 98.2 F (36.8 C) 97.6 F (36.4 C)  TempSrc: Oral Oral Oral Oral  Resp: 20 20 18 18   Height:      Weight:      SpO2: 98% 98% 98% 100%    Intake/Output Summary (Last 24 hours) at 05/30/15 0914 Last data filed at 05/30/15 0500  Gross per 24 hour  Intake    360 ml  Output    200 ml  Net    160 ml   Filed Weights   05/26/15 0915  Weight: 239 lb 6.7 oz (108.6 kg)    PHYSICAL EXAM  General: Pleasant, NAD. Neuro: Right hemiparesis. Expressive aphasia Psych: Normal affect. HEENT:  Normal  Neck: Supple without bruits or JVD. Lungs:  Resp regular and unlabored, CTA. Heart: RRR no s3, s4, or murmurs. Abdomen: Soft,  non-tender, non-distended, BS + x 4.  Extremities: No clubbing, cyanosis or edema. DP/PT/Radials 2+ and equal bilaterally.  Accessory Clinical Findings  CBC  Recent Labs  05/29/15 0528 05/30/15 0443  WBC 5.7 5.6  HGB 12.8 13.7  HCT 38.7 41.6  MCV 98.2 98.3  PLT 164 0000000   Basic Metabolic Panel  Recent Labs  05/28/15 1041 05/29/15 0528 05/30/15 0443  NA  --  137 138  K  --  3.6 3.5  CL  --  103 103  CO2  --  27 28  GLUCOSE  --  114* 92  BUN  --  <5* <5*  CREATININE  --  0.57 0.65  CALCIUM  --  8.6* 9.0  MG 2.0  --   --    Liver Function Tests No results for input(s): AST, ALT, ALKPHOS, BILITOT, PROT, ALBUMIN in the last 72 hours. No results for input(s): LIPASE, AMYLASE in the last 72 hours. Cardiac Enzymes  Recent Labs  05/28/15 1700 05/28/15 2300 05/29/15 0528  TROPONINI 0.29* 0.23* 0.18*   BNP Invalid input(s): POCBNP D-Dimer No results for input(s): DDIMER in the last 72  hours. Hemoglobin A1C No results for input(s): HGBA1C in the last 72 hours. Fasting Lipid Panel No results for input(s): CHOL, HDL, LDLCALC, TRIG, CHOLHDL, LDLDIRECT in the last 72 hours. Thyroid Function Tests No results for input(s): TSH, T4TOTAL, T3FREE, THYROIDAB in the last 72 hours.  Invalid input(s): FREET3  TELE  NSR  ECG    Radiology/Studies  Ct Angio Head W/cm &/or Wo Cm  05/26/2015  CLINICAL DATA:  Neck up stroke. Altered mental status. Unable to speak clearly. Right-sided weakness and facial droop. EXAM: CT ANGIOGRAPHY HEAD AND NECK TECHNIQUE: Multidetector CT imaging of the head and neck was performed using the standard protocol during bolus administration of intravenous contrast. Multiplanar CT image reconstructions and MIPs were obtained to evaluate the vascular anatomy. Carotid stenosis measurements (when applicable) are obtained utilizing NASCET criteria, using the distal internal carotid diameter as the denominator. CONTRAST:  49mL OMNIPAQUE IOHEXOL 350 MG/ML  SOLN COMPARISON:  CT profusion study from the same day. CT head without contrast from the same day. FINDINGS: CT HEAD Brain: Source images confirm an acute infarct involving the posterior left insular ribbon is well is the cortex over the superior left temporal lobe and left parietal lobe. No acute hemorrhage or mass lesion is present. The ganglia are intact. Calvarium and skull base: Negative. Paranasal sinuses: Clear Orbits: Within normal limits. CTA NECK Aortic arch: A 3 vessel arch configuration is present. Atherosclerotic calcifications are present without significant stenoses at the origins the great vessels. Right carotid system: The right common carotid artery is within normal limits. Minimal atherosclerotic changes are present at the carotid bifurcation. The cervical right ICA is normal. Left carotid system: The left common carotid artery is within normal limits. Atherosclerotic changes are slightly more prominent within the left carotid bifurcation. There is no significant stenosis relative to the more distal vessel. The cervical left ICA is normal. Vertebral arteries:The vertebral arteries both originate from the subclavian arteries without significant stenosis. The left vertebral artery is dominant. There is no focal stenosis or vascular injury within the cervical vertebral arteries. Skeleton: Mild endplate degenerative changes are most noted at C3-4 and C4-5. No focal lytic or blastic lesions are present. Other neck: The soft tissues of the neck are unremarkable. CTA HEAD Anterior circulation: The internal carotid arteries are within normal limits from the high cervical segments through the ICA termini. The left A1 segment is dominant. The M1 segments are intact bilaterally. There is a significant proximal stenosis in the more lateral left M2 segment with significant attenuation of more distal branch vessels no focal proximal occlusion is present. Right MCA and bilateral ACA branch vessels are within  normal limits. Posterior circulation: The left vertebral artery is slightly dominant to the right. The PICA origins are visualized and normal bilaterally. Basilar artery is within normal limits. Both posterior cerebral arteries originate the basilar tip. PCA branch vessels are intact. Venous sinuses: The dural sinuses are patent. The right transverse sinus is dominant. The straight sinus and deep cerebral veins are patent. Cortical veins are intact. Anatomic variants: None Delayed phase: Postcontrast images better demonstrate the areas of developing acute nonhemorrhagic infarction involving the posterior left insular cortex, left temporal lobe, and left parietal lobe. No pathologic enhancement is evident. IMPRESSION: 1. Developing acute/subacute nonhemorrhagic infarct of posterior left insular cortex, superior left temporal, and left parietal lobe. 2. Moderate to severe focal stenosis within the more lateral left M2 branch with significant attenuation of distal MCA branch vessels corresponding to the infarct territory. 3. No  other significant proximal stenosis, aneurysm, or branch vessel occlusion. These results were called by telephone at the time of interpretation on 05/26/2015 at 12:51 Pm to Dr. Aram Beecham, who verbally acknowledged these results. Electronically Signed   By: San Morelle M.D.   On: 05/26/2015 13:10   Ct Head Wo Contrast  05/26/2015  CLINICAL DATA:  Unresponsive EXAM: CT HEAD WITHOUT CONTRAST TECHNIQUE: Contiguous axial images were obtained from the base of the skull through the vertex without intravenous contrast. COMPARISON:  04/21/2015 FINDINGS: Bony calvarium is intact. No acute hemorrhage or space-occupying mass lesion is noted. There is some blunting of the sulcal markings in the left parietal lobe near the vertex which may represent some very early ischemia. No other focal abnormality is noted. IMPRESSION: Question early ischemia in distribution of the left middle cerebral artery.  No other focal abnormality is noted. These results were called by telephone at the time of interpretation on 05/26/2015 at 9:11 am to Dr. Jola Schmidt , who verbally acknowledged these results. Electronically Signed   By: Inez Catalina M.D.   On: 05/26/2015 09:11   Ct Angio Neck W/cm &/or Wo/cm  05/26/2015  CLINICAL DATA:  Neck up stroke. Altered mental status. Unable to speak clearly. Right-sided weakness and facial droop. EXAM: CT ANGIOGRAPHY HEAD AND NECK TECHNIQUE: Multidetector CT imaging of the head and neck was performed using the standard protocol during bolus administration of intravenous contrast. Multiplanar CT image reconstructions and MIPs were obtained to evaluate the vascular anatomy. Carotid stenosis measurements (when applicable) are obtained utilizing NASCET criteria, using the distal internal carotid diameter as the denominator. CONTRAST:  23mL OMNIPAQUE IOHEXOL 350 MG/ML SOLN COMPARISON:  CT profusion study from the same day. CT head without contrast from the same day. FINDINGS: CT HEAD Brain: Source images confirm an acute infarct involving the posterior left insular ribbon is well is the cortex over the superior left temporal lobe and left parietal lobe. No acute hemorrhage or mass lesion is present. The ganglia are intact. Calvarium and skull base: Negative. Paranasal sinuses: Clear Orbits: Within normal limits. CTA NECK Aortic arch: A 3 vessel arch configuration is present. Atherosclerotic calcifications are present without significant stenoses at the origins the great vessels. Right carotid system: The right common carotid artery is within normal limits. Minimal atherosclerotic changes are present at the carotid bifurcation. The cervical right ICA is normal. Left carotid system: The left common carotid artery is within normal limits. Atherosclerotic changes are slightly more prominent within the left carotid bifurcation. There is no significant stenosis relative to the more distal vessel.  The cervical left ICA is normal. Vertebral arteries:The vertebral arteries both originate from the subclavian arteries without significant stenosis. The left vertebral artery is dominant. There is no focal stenosis or vascular injury within the cervical vertebral arteries. Skeleton: Mild endplate degenerative changes are most noted at C3-4 and C4-5. No focal lytic or blastic lesions are present. Other neck: The soft tissues of the neck are unremarkable. CTA HEAD Anterior circulation: The internal carotid arteries are within normal limits from the high cervical segments through the ICA termini. The left A1 segment is dominant. The M1 segments are intact bilaterally. There is a significant proximal stenosis in the more lateral left M2 segment with significant attenuation of more distal branch vessels no focal proximal occlusion is present. Right MCA and bilateral ACA branch vessels are within normal limits. Posterior circulation: The left vertebral artery is slightly dominant to the right. The PICA origins are visualized and normal  bilaterally. Basilar artery is within normal limits. Both posterior cerebral arteries originate the basilar tip. PCA branch vessels are intact. Venous sinuses: The dural sinuses are patent. The right transverse sinus is dominant. The straight sinus and deep cerebral veins are patent. Cortical veins are intact. Anatomic variants: None Delayed phase: Postcontrast images better demonstrate the areas of developing acute nonhemorrhagic infarction involving the posterior left insular cortex, left temporal lobe, and left parietal lobe. No pathologic enhancement is evident. IMPRESSION: 1. Developing acute/subacute nonhemorrhagic infarct of posterior left insular cortex, superior left temporal, and left parietal lobe. 2. Moderate to severe focal stenosis within the more lateral left M2 branch with significant attenuation of distal MCA branch vessels corresponding to the infarct territory. 3. No  other significant proximal stenosis, aneurysm, or branch vessel occlusion. These results were called by telephone at the time of interpretation on 05/26/2015 at 12:51 Pm to Dr. Aram Beecham, who verbally acknowledged these results. Electronically Signed   By: San Morelle M.D.   On: 05/26/2015 13:10   Mr Jodene Nam Head Wo Contrast  05/27/2015  CLINICAL DATA:  Left MCA territory infarct. Abnormal speech. Facial droop. The examination had to be discontinued prior to completion due to patient unable to tolerate despite medication. EXAM: MRI HEAD WITHOUT CONTRAST MRA HEAD WITHOUT CONTRAST TECHNIQUE: Multiplanar, multiecho pulse sequences of the brain and surrounding structures were obtained without intravenous contrast. Angiographic images of the head were obtained using MRA technique without contrast. COMPARISON:  CTA head and neck, CT perfusion, and CT head 05/26/2015 FINDINGS: MRI HEAD FINDINGS The diffusion-weighted images confirm an acute infarct involving the posterior aspect of the left MCA territory. The posterior tooth there is the left insular cortex are involved. There is involvement of the posterior left frontal lobe, left parietal lobe, in the superior left temporal lobe. There is significant patient motion which could cause some artifact on the susceptibility weighted imaging. However, petechial hemorrhages suspected within the left temporal and parietal lobe infarct. MRA HEAD FINDINGS The MRA is also distorted by patient motion. Internal carotid arteries are within normal limits from the high cervical segments through the ICA termini bilaterally. The A1 and M1 segments are intact. The MCA bifurcations are intact. There is significant attenuation of MCA branch vessels bilaterally, distorted by patient motion. The left vertebral artery is the dominant vessel. The PICA origins are visualized and normal bilaterally. The basilar artery is within normal limits. Both posterior cerebral arteries originate from  basilar tip. The posterior cerebral arteries are within normal limits bilaterally. IMPRESSION: 1. Confirmation of an acute infarct involving the posterior left MCA territory. 2. Probable petechial hemorrhage within the infarct. 3. Significant attenuation of proximal MCA branch vessels bilaterally without a more proximal stenosis or occlusion. 4. The study is moderately degraded by patient motion, limiting evaluation of medium and distal small vessels. Electronically Signed   By: San Morelle M.D.   On: 05/27/2015 12:03   Mr Brain Wo Contrast  05/27/2015  CLINICAL DATA:  Left MCA territory infarct. Abnormal speech. Facial droop. The examination had to be discontinued prior to completion due to patient unable to tolerate despite medication. EXAM: MRI HEAD WITHOUT CONTRAST MRA HEAD WITHOUT CONTRAST TECHNIQUE: Multiplanar, multiecho pulse sequences of the brain and surrounding structures were obtained without intravenous contrast. Angiographic images of the head were obtained using MRA technique without contrast. COMPARISON:  CTA head and neck, CT perfusion, and CT head 05/26/2015 FINDINGS: MRI HEAD FINDINGS The diffusion-weighted images confirm an acute infarct involving the posterior aspect of  the left MCA territory. The posterior tooth there is the left insular cortex are involved. There is involvement of the posterior left frontal lobe, left parietal lobe, in the superior left temporal lobe. There is significant patient motion which could cause some artifact on the susceptibility weighted imaging. However, petechial hemorrhages suspected within the left temporal and parietal lobe infarct. MRA HEAD FINDINGS The MRA is also distorted by patient motion. Internal carotid arteries are within normal limits from the high cervical segments through the ICA termini bilaterally. The A1 and M1 segments are intact. The MCA bifurcations are intact. There is significant attenuation of MCA branch vessels bilaterally,  distorted by patient motion. The left vertebral artery is the dominant vessel. The PICA origins are visualized and normal bilaterally. The basilar artery is within normal limits. Both posterior cerebral arteries originate from basilar tip. The posterior cerebral arteries are within normal limits bilaterally. IMPRESSION: 1. Confirmation of an acute infarct involving the posterior left MCA territory. 2. Probable petechial hemorrhage within the infarct. 3. Significant attenuation of proximal MCA branch vessels bilaterally without a more proximal stenosis or occlusion. 4. The study is moderately degraded by patient motion, limiting evaluation of medium and distal small vessels. Electronically Signed   By: San Morelle M.D.   On: 05/27/2015 12:03   Ct Cerebral Perfusion W/cm  05/26/2015  ADDENDUM REPORT: 05/26/2015 13:11 ADDENDUM: These results were called by telephone at the time of interpretation on 05/26/2015 at 12:51 pm to Dr. Aram Beecham, who verbally acknowledged these results. Electronically Signed   By: San Morelle M.D.   On: 05/26/2015 13:11  05/26/2015  CLINICAL DATA:  Altered mental status.  Mental SP. EXAM: CT CEREBRAL PERFUSION WITH CONTRAST TECHNIQUE: Dynamic imaging was performed following the pulse injection of contrast. Para metric maps were subsequently calculated. CONTRAST:  29mL OMNIPAQUE IOHEXOL 350 MG/ML SOLN COMPARISON:  CT head without contrast from the same date. FINDINGS: Being time to transit is ext the and cerebral blood flow is diminished in the superior left temporal lobe and left parietal lobe within the posterior left MCA distribution. This involves posterior aspect of the left insular ribbon. There is less profound decrease in cerebral blood volume within the same territory. Less than 1/3 of the left MCA territory constitutes core infarct. Parameters are within normal limits in the right hemisphere IMPRESSION: 1. Large area of ischemia involving the posterior aspect of  the left MCA territory. This involves the posterior left insular ribbon, superior left temporal lobe, and left parietal white. 2. More subtle cerebral blood volume changes with the area of core infarct constituting less than 1/3 of the left MCA territory. Electronically Signed: By: San Morelle M.D. On: 05/26/2015 12:51   Dg Swallowing Func-speech Pathology  05/28/2015  Objective Swallowing Evaluation:   Patient Details [name] MRN: [id number] Date of Birth: 1955/11/02 Today's Date: 05/28/2015 Time: SLP Start Time (ACUTE ONLY): 1321-SLP Stop Time (ACUTE ONLY): 1335 SLP Time Calculation (min) (ACUTE ONLY): 14 min Past Medical History: Past Medical History Diagnosis Date . Hypertension  . Asthma  . COPD (chronic obstructive pulmonary disease) ([place])  . Anxiety  . Colon cancer ([place])    a. s/p surgery, chemotherapy. . Dyslipidemia  . Migraine headache  . Chronic bronchitis  . Uterine fibroid  . Ovarian cyst  . GERD (gastroesophageal reflux disease)  . Morbid obesity ([place])  . Obstructive sleep apnea    mild . Chronic pain  . Chronic back pain  . Arthritis  . Osteoarthritis  .  Depression  . DVT (deep venous thrombosis) ([place]) 02/2014 . 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 PCCM recommended lifelong anticoagulation. . Tobacco abuse  . PVC's (premature ventricular contractions) 2013 . Aortic stenosis    a. mild by echo 05/2015. Past Surgical History: Past Surgical History Procedure Laterality Date . Knee arthroscopy     bil. . Carpal tunnel release     rt . Mouth surgery     teeth extraction . Cesarean section   . Subtotal colectomy   . Colonoscopy   . Tubal ligation   HPI: 59 y.o. female brought in with acute receptive>>expressive aphasia. Woke up with stroke, with CT already showing question of early ischemia in distribution of the left middle cerebral artery.  Subjective: pt expressively aphasic Assessment / Plan / Recommendation CHL IP CLINICAL IMPRESSIONS 05/28/2015 Therapy Diagnosis  Moderate oral phase dysphagia Clinical Impression Pt has a moderate oral dysphagia, although has good strength and timing of her pharyngeal phase. She has prolonged oral preparation and mastication, with small amounts of oral residue remaining after the swallow. Min cues provided for continued mastication with soft solid. Recommend initiation of Dys 1 textures and thin liquids, meds crushed in puree. Impact on safety and function Mild aspiration risk   CHL IP TREATMENT RECOMMENDATION 05/28/2015 Treatment Recommendations Therapy as outlined in treatment plan below   Prognosis 05/28/2015 Prognosis for Safe Diet Advancement Good Barriers to Reach Goals -- Barriers/Prognosis Comment -- CHL IP DIET RECOMMENDATION 05/28/2015 SLP Diet Recommendations Dysphagia 1 (Puree) solids;Thin liquid Liquid Administration via Cup;Straw Medication Administration Crushed with puree Compensations Minimize environmental distractions;Slow rate;Small sips/bites;Lingual sweep for clearance of pocketing Postural Changes Seated upright at 90 degrees   CHL IP OTHER RECOMMENDATIONS 05/28/2015 Recommended Consults -- Oral Care Recommendations Oral care BID;Oral care before and after PO Other Recommendations --   CHL IP FOLLOW UP RECOMMENDATIONS 05/28/2015 Follow up Recommendations Inpatient Rehab   CHL IP FREQUENCY AND DURATION 05/28/2015 Speech Therapy Frequency (ACUTE ONLY) min 2x/week Treatment Duration 2 weeks      CHL IP ORAL PHASE 05/28/2015 Oral Phase Impaired Oral - Pudding Teaspoon -- Oral - Pudding Cup -- Oral - Honey Teaspoon -- Oral - Honey Cup -- Oral - Nectar Teaspoon -- Oral - Nectar Cup -- Oral - Nectar Straw -- Oral - Thin Teaspoon -- Oral - Thin Cup Weak lingual manipulation;Reduced posterior propulsion;Lingual/palatal residue Oral - Thin Straw Weak lingual manipulation;Reduced posterior propulsion;Lingual/palatal residue Oral - Puree Weak lingual manipulation;Reduced posterior propulsion;Lingual/palatal residue Oral - Mech Soft  Weak lingual manipulation;Reduced posterior propulsion;Lingual/palatal residue;Impaired mastication Oral - Regular -- Oral - Multi-Consistency -- Oral - Pill -- Oral Phase - Comment --  CHL IP PHARYNGEAL PHASE 05/28/2015 Pharyngeal Phase WFL Pharyngeal- Pudding Teaspoon -- Pharyngeal -- Pharyngeal- Pudding Cup -- Pharyngeal -- Pharyngeal- Honey Teaspoon -- Pharyngeal -- Pharyngeal- Honey Cup -- Pharyngeal -- Pharyngeal- Nectar Teaspoon -- Pharyngeal -- Pharyngeal- Nectar Cup -- Pharyngeal -- Pharyngeal- Nectar Straw -- Pharyngeal -- Pharyngeal- Thin Teaspoon -- Pharyngeal -- Pharyngeal- Thin Cup -- Pharyngeal -- Pharyngeal- Thin Straw -- Pharyngeal -- Pharyngeal- Puree -- Pharyngeal -- Pharyngeal- Mechanical Soft -- Pharyngeal -- Pharyngeal- Regular -- Pharyngeal -- Pharyngeal- Multi-consistency -- Pharyngeal -- Pharyngeal- Pill -- Pharyngeal -- Pharyngeal Comment --  CHL IP CERVICAL ESOPHAGEAL PHASE 05/28/2015 Cervical Esophageal Phase WFL Pudding Teaspoon -- Pudding Cup -- Honey Teaspoon -- Honey Cup -- Nectar Teaspoon -- Nectar Cup -- Nectar Straw -- Thin Teaspoon -- Thin Cup -- Thin Straw -- Puree -- Mechanical Soft -- Regular --  Multi-consistency -- Pill -- Cervical Esophageal Comment -- [name], M.A. CCC-SLP [phone] [name] 05/28/2015, 2:11 PM               ASSESSMENT AND PLAN  1. Acute stroke - occurred in the setting of subtherapeutic INR (1.6) and also moderate-severe cerebrovascular disease in this territory by imaging. Now on xarelto. Off ASA.  2. Chest pain with elevated troponin - unclear if this represents elevated value in the setting of stroke versus an ACS. It is impossible to elicit an adequate history of her chest pain, although she does have significant tenderness to palpation which would indicate a MSK etiology. EKG is normal.. No RWMA by echo. She does not exhibit any signs of acute PE including tachypnea, dyspnea, hypoxia, or tachycardia.Now back on lifelong  anticoagulation with xarelto. Her aphasia and acute stroke make her a poor candidate for cath at present time.   3. HTN - being managed by IM/neuro in the context of permissive HTN.  4. Hyperkalemia then hypokalemia - per IM.  5. Mild AS by echo - follow clinically for now.  Plan: No plans for any further inpatient cardiology evaluation. She appears to have a long road of neuro rehab ahead of her. Consider outpatient lexiscan at a later date. Will sign off now.  Please call for questions. Signed, Warren Danes MD

## 2015-05-30 NOTE — Care Management Note (Signed)
Case Management Note  Patient Details  [name] MRN: [id number] Date of Birth: 14-Dec-1955  Subjective/Objective:     Patient admitted with CVA. Patient is from home with her daughter.                Action/Plan: Plan is for patient to be discharged to CIR today. No further needs per CM.   Expected Discharge Date:                  Expected Discharge Plan:  [place]  In-House Referral:     Discharge planning Services     Post Acute Care Choice:    Choice offered to:     DME Arranged:    DME Agency:     HH Arranged:    [place] Agency:     Status of Service:  Completed, signed off  Medicare Important Message Given:  Yes Date Medicare IM Given:    Medicare IM give by:    Date Additional Medicare IM Given:    Additional Medicare Important Message give by:     If discussed at [place] of Stay Meetings, dates discussed:    Additional Comments:  Pollie Friar, RN 05/30/2015, 11:11 AM

## 2015-05-30 NOTE — H&P (View-Only) (Signed)
Physical Medicine and Rehabilitation Admission H&P    Chief Complaint  Patient presents with  . Difficulty speaking.    HPI:  Brooke [name] is a 59 y.o. female with history of HTN, COPD, Colon CA, Migraines, OSA, morbid obesity, DVT/PE on coumadin who was admitted on 05/26/15 after being found by family with confusion, lethargy and difficulty speaking on 05/26/15. History taken from chart review and daughter. Last seen normal 11 pm the night before incident. CT brain with question of early ischemia in L-MCA distribution and INR- 1.61. CTA with confirmed large area of ischemia involving posterior left insular ribbon, superior left temporal lobe, and left parietal white. MRI/MRA brain revealed large L-MCA territory infarct with moderate to severe focal stenosis within more lateral L-M2 branch. She was started on IV heparin for treatment. 2 D echo with EF 50-55%, mild aortic stenosis, trivial MR and no wall abnormality. She developed chest pain with elevated troponin and cardiology consulted for input. Dr. Mare Ferrari felt elevated troponin could bed due to stroke v/s ACS and CP appeared musculoskeletal in nature per exam-doubt PE. Patient not a candidate for cardiac cath and to consider switching to NOAC as patient on lifelong anticoagulation. MBS done to evaluate dysphagia and patient started on dysphagia 1, thin liquids due to difficulty handling oral bolus.   She did have episode of significant hypoxia with oxygen down to 60's this am that resolved with supplemental oxygen and continues to have complaints of chest discomfort felt to be due to MS symptoms. No further work up recommended. Patient with resultant right inattention, right sided weakness/apraxia, global aphasia affecting ability to follow commands, swallowing, insight into deficits as well as safety with mobility.  CIR recommended for follow up therapy and patient cleared medically for admission.   Review of Systems  Unable to  perform ROS: language     Past Medical History  Diagnosis Date  . Hypertension   . Asthma   . COPD (chronic obstructive pulmonary disease) ([place])   . Anxiety   . Colon cancer ([place])     a. s/p surgery, chemotherapy.  . Dyslipidemia   . Migraine headache   . Chronic bronchitis   . Uterine fibroid   . Ovarian cyst   . GERD (gastroesophageal reflux disease)   . Morbid obesity ([place])   . Obstructive sleep apnea     mild  . Chronic pain   . Chronic back pain   . Arthritis   . Osteoarthritis   . Depression   . DVT (deep venous thrombosis) ([place]) 02/2014  . 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 PCCM recommended lifelong anticoagulation.  . Tobacco abuse   . PVC's (premature ventricular contractions) 2013  . Aortic stenosis     a. mild by echo 05/2015.    Past Surgical History  Procedure Laterality Date  . Knee arthroscopy      bil.  . Carpal tunnel release      rt  . Mouth surgery      teeth extraction  . Cesarean section    . Subtotal colectomy    . Colonoscopy    . Tubal ligation      Family History  Problem Relation Age of Onset  . Uterine cancer Mother   . Stroke Father   . Colon cancer Maternal Uncle   . Diabetes Father   . Ovarian cancer Mother   . Hypertension Father   . Breast cancer Maternal Grandmother   . Diabetes Sister   .  Asthma Child   . Asthma Child     Social History:  Has been living with daughter for the past few months and helping out with grandchildren. Per reports that she has been smoking Cigarettes--1 1/2 PPD.  She has a 37 pack-year smoking history. She has never used smokeless tobacco.  Per reports that she drinks alcohol on rare occasion. She does not use illicit drugs.    Allergies  Allergen Reactions  . Kiwi Extract Anaphylaxis, Hives and Swelling    Makes her swell all over   . Aspirin Nausea Only    Can take as long as enteric coated    Medications Prior to Admission  Medication Sig Dispense Refill  .  acetaminophen (TYLENOL) 500 MG tablet Take 500 mg by mouth every 6 (six) hours as needed for headache.    . albuterol (PROVENTIL HFA;VENTOLIN HFA) 108 (90 BASE) MCG/ACT inhaler Inhale 2 puffs into the lungs every 6 (six) hours as needed for wheezing or shortness of breath (shortness of breath).    Marland Kitchen amitriptyline (ELAVIL) 50 MG tablet Take 50 mg by mouth at bedtime.    . citalopram (CELEXA) 20 MG tablet Take 20 mg by mouth daily.    . diphenhydrAMINE (BENADRYL) 25 MG tablet Take 1 tablet (25 mg total) by mouth every 6 (six) hours as needed (at first sign of headache). (Patient taking differently: Take 25 mg by mouth every 6 (six) hours as needed for itching or allergies. ) 10 tablet 0  . furosemide (LASIX) 20 MG tablet Take 1 tablet (20 mg total) by mouth daily. 30 tablet 2  . gabapentin (NEURONTIN) 300 MG capsule Take 1 capsule (300 mg total) by mouth at bedtime. 30 capsule 0  . LORazepam (ATIVAN) 1 MG tablet Take 1 tablet (1 mg total) by mouth every 6 (six) hours as needed for anxiety (anxiety). 30 tablet 0  . omeprazole (PRILOSEC) 40 MG capsule Take 1 capsule (40 mg total) by mouth daily. 30 capsule 3  . oxyCODONE-acetaminophen (PERCOCET) 7.5-325 MG tablet Take 1 tablet by mouth every 6 (six) hours as needed for moderate pain. pain  0  . potassium chloride SA (K-DUR,KLOR-CON) 20 MEQ tablet Take 1 tablet (20 mEq total) by mouth daily. 10 tablet 0  . warfarin (COUMADIN) 5 MG tablet Take 1 tablet (5 mg total) by mouth daily. 30 tablet 2  . B Complex Vitamins (VITAMIN-B COMPLEX PO) Take 1 tablet by mouth daily.    . cyclobenzaprine (FLEXERIL) 10 MG tablet Take 1 tablet (10 mg total) by mouth 2 (two) times daily as needed for muscle spasms. 20 tablet 0  . ipratropium-albuterol (DUONEB) 0.5-2.5 (3) MG/3ML SOLN Take 3 mLs by nebulization every 6 (six) hours. (Patient not taking: Reported on 05/26/2015) 360 mL 1    Home: Home Living Family/patient expects to be discharged to:: Private residence Living  Arrangements: Children Additional Comments: Unsure of PLOF - no family present. Lives with daughter   Functional History:    Functional Status:  Mobility: Bed Mobility Overal bed mobility: Needs Assistance Bed Mobility: Supine to Sit, Sit to Supine Supine to sit: Min assist Sit to supine: Min assist General bed mobility comments: Min assist (hand held assist) for boost to come to sitting and for safety due to pt's impulsivity. HOB slightly elevated. Transfers Overall transfer level: Needs assistance Equipment used: Rolling walker (2 wheeled), 1 person hand held assist Transfers: Sit to/from Stand Sit to Stand: Min assist, +2 safety/equipment Stand pivot transfers: Min assist, +2 safety/equipment  General transfer comment: Min assist +2 for safety due to pt's severe impulsivity, unsteadiness and R inattention. Pt unable to grip RW with R hand without assist and ambulated better without AD.  Ambulation/Gait Ambulation/Gait assistance: Min assist Ambulation Distance (Feet): 20 Feet Assistive device: 1 person hand held assist, Rolling walker (2 wheeled) Gait Pattern/deviations: Step-through pattern General Gait Details: AMb to doorway with RW with inability to maintain R UE grip on RW.  At doorway, pt abruptly stopping and wanted to turn around.  Eyes watering and think he may have been brightness of hallway.  Performed another bout of gait with L HHA with MIN A in room.  Decreased safety with turning due to IV and R side inattention.    ADL: ADL Overall ADL's : Needs assistance/impaired Grooming: Wash/dry face, Set up, Cueing for sequencing, Sitting Lower Body Bathing: Minimal assistance, +2 for safety/equipment, Cueing for sequencing, Sit to/from stand Upper Body Dressing : Set up, Cueing for sequencing, Sitting Toilet Transfer: Minimal assistance, +2 for safety/equipment, Ambulation, BSC, Cueing for sequencing, Cueing for safety Toileting- Clothing Manipulation and Hygiene: Minimal  assistance, Cueing for safety, Cueing for sequencing, Sit to/from stand Functional mobility during ADLs: Minimal assistance, +2 for safety/equipment, Rolling walker General ADL Comments: Pt very impulsive and with decreased attention. Mod verbal cues to initiate movement and to maintain safety while ambulating and completing ADLs. Pt spontaneously attempting to use RUE to reach for items and to wave "goodbye" but had increased difficulty when asked to use RUE. At end of session, pt attempted to get out of chair and climb into the bed despite max verbal and tactile cues to stop and wait for assistance.    Cognition: Cognition Overall Cognitive Status: Impaired/Different from baseline Arousal/Alertness: Lethargic Orientation Level: Oriented to person, Oriented to place, Other (comment) (pt asleep but arousable  ) Attention: Focused, Sustained Focused Attention: Appears intact Sustained Attention: Impaired Sustained Attention Impairment: Verbal complex, Functional complex Memory: Appears intact Awareness: Impaired Awareness Impairment: Intellectual impairment, Emergent impairment, Anticipatory impairment Problem Solving: Appears intact Behaviors: Restless, Impulsive, Perseveration, Poor frustration tolerance Safety/Judgment: Impaired Cognition Arousal/Alertness: Awake/alert Behavior During Therapy: Impulsive Overall Cognitive Status: Impaired/Different from baseline Area of Impairment: Following commands, Awareness, Safety/judgement, Problem solving, Attention Current Attention Level: Sustained Memory: Decreased recall of precautions Following Commands: Follows one step commands inconsistently Safety/Judgement: Decreased awareness of safety, Decreased awareness of deficits Awareness: Intellectual Problem Solving: Requires verbal cues, Requires tactile cues, Decreased initiation, Slow processing Difficult to assess due to: Impaired communication   Blood pressure 101/75, pulse 78,  temperature 98 F (36.7 C), temperature source Oral, resp. rate 20, height 4' 11"  (1.499 m), weight 108.6 kg (239 lb 6.7 oz), last menstrual period 05/24/2003, SpO2 98 %. Physical Exam  Nursing note and vitals reviewed. Constitutional: She appears well-developed and well-nourished.  Obese female. NAD. Sitting up in bed with tendency to list to the left.   HENT:  Head: Normocephalic and atraumatic.  Eyes: Conjunctivae and EOM are normal. Pupils are equal, round, and reactive to light.  Neck: Normal range of motion. Neck supple.  Cardiovascular: Normal rate and regular rhythm.   Respiratory: Effort normal. No respiratory distress. She has wheezes. She exhibits tenderness.  GI: Soft. Bowel sounds are normal. She exhibits no distension. There is no tenderness.  Musculoskeletal: She exhibits no edema or tenderness.  Neurological: She is alert.  Receptive > expressive aphasia with severe dysarthria and apraxia.   She was able to answer yes/no question with about 75% accuracy.  With choice  of two was able to state that she was in hospital and had a stroke.  Unable to point and displayed perseverative behaviors and difficult to redirect at times.  Has right inattention with right body neglect.   Impulsive and lacks insight/awareness of deficits.   Sensation appears to be diminished RLE Motor: LUE/LLE: Appears to be 4/5 proximal to distal RUE: Appears to be 4-/5 shoulder abduction, elbow flexion/extension, 0/5 finger grip RLE: Appears to be 2/5 proximal to distal  Skin: Skin is warm and dry.  Psychiatric: Her affect is blunt. Her speech is rapid and/or pressured (at times) and slurred. She expresses impulsivity. She is inattentive.    Results for orders placed or performed during the hospital encounter of 05/26/15 (from the past 48 hour(s))  Troponin I     Status: Abnormal   Collection Time: 05/28/15 10:41 AM  Result Value Ref Range   Troponin I 0.24 (H) <0.031 ng/mL    Comment:          PERSISTENTLY INCREASED TROPONIN VALUES IN THE RANGE OF 0.04-0.49 ng/mL CAN BE SEEN IN:       -UNSTABLE ANGINA       -CONGESTIVE HEART FAILURE       -MYOCARDITIS       -CHEST TRAUMA       -ARRYHTHMIAS       -LATE PRESENTING MYOCARDIAL INFARCTION       -COPD   CLINICAL FOLLOW-UP RECOMMENDED.   Magnesium     Status: None   Collection Time: 05/28/15 10:41 AM  Result Value Ref Range   Magnesium 2.0 1.7 - 2.4 mg/dL  Heparin level (unfractionated)     Status: Abnormal   Collection Time: 05/28/15  2:35 PM  Result Value Ref Range   Heparin Unfractionated <0.10 (L) 0.30 - 0.70 IU/mL    Comment:        IF HEPARIN RESULTS ARE BELOW EXPECTED VALUES, AND PATIENT DOSAGE HAS BEEN CONFIRMED, SUGGEST FOLLOW UP TESTING OF ANTITHROMBIN III LEVELS. REPEATED TO VERIFY   Troponin I (q 6hr x 3)     Status: Abnormal   Collection Time: 05/28/15  5:00 PM  Result Value Ref Range   Troponin I 0.29 (H) <0.031 ng/mL    Comment:        PERSISTENTLY INCREASED TROPONIN VALUES IN THE RANGE OF 0.04-0.49 ng/mL CAN BE SEEN IN:       -UNSTABLE ANGINA       -CONGESTIVE HEART FAILURE       -MYOCARDITIS       -CHEST TRAUMA       -ARRYHTHMIAS       -LATE PRESENTING MYOCARDIAL INFARCTION       -COPD   CLINICAL FOLLOW-UP RECOMMENDED.   Troponin I (q 6hr x 3)     Status: Abnormal   Collection Time: 05/28/15 11:00 PM  Result Value Ref Range   Troponin I 0.23 (H) <0.031 ng/mL    Comment:        PERSISTENTLY INCREASED TROPONIN VALUES IN THE RANGE OF 0.04-0.49 ng/mL CAN BE SEEN IN:       -UNSTABLE ANGINA       -CONGESTIVE HEART FAILURE       -MYOCARDITIS       -CHEST TRAUMA       -ARRYHTHMIAS       -LATE PRESENTING MYOCARDIAL INFARCTION       -COPD   CLINICAL FOLLOW-UP RECOMMENDED.   CBC     Status: None   Collection Time: 05/29/15  5:28 AM  Result Value Ref Range   WBC 5.7 4.0 - 10.5 K/uL   RBC 3.94 3.87 - 5.11 MIL/uL   Hemoglobin 12.8 12.0 - 15.0 g/dL   HCT 38.7 36.0 - 46.0 %   MCV 98.2 78.0 -  100.0 fL   MCH 32.5 26.0 - 34.0 pg   MCHC 33.1 30.0 - 36.0 g/dL   RDW 13.7 11.5 - 15.5 %   Platelets 164 150 - 400 K/uL  Basic metabolic panel     Status: Abnormal   Collection Time: 05/29/15  5:28 AM  Result Value Ref Range   Sodium 137 135 - 145 mmol/L   Potassium 3.6 3.5 - 5.1 mmol/L   Chloride 103 101 - 111 mmol/L   CO2 27 22 - 32 mmol/L   Glucose, Bld 114 (H) 65 - 99 mg/dL   BUN <5 (L) 6 - 20 mg/dL   Creatinine, Ser 0.57 0.44 - 1.00 mg/dL   Calcium 8.6 (L) 8.9 - 10.3 mg/dL   GFR calc non Af Amer >60 >60 mL/min   GFR calc Af Amer >60 >60 mL/min    Comment: (NOTE) The eGFR has been calculated using the CKD EPI equation. This calculation has not been validated in all clinical situations. eGFR's persistently <60 mL/min signify possible Chronic Kidney Disease.    Anion gap 7 5 - 15  Troponin I (q 6hr x 3)     Status: Abnormal   Collection Time: 05/29/15  5:28 AM  Result Value Ref Range   Troponin I 0.18 (H) <0.031 ng/mL    Comment:        PERSISTENTLY INCREASED TROPONIN VALUES IN THE RANGE OF 0.04-0.49 ng/mL CAN BE SEEN IN:       -UNSTABLE ANGINA       -CONGESTIVE HEART FAILURE       -MYOCARDITIS       -CHEST TRAUMA       -ARRYHTHMIAS       -LATE PRESENTING MYOCARDIAL INFARCTION       -COPD   CLINICAL FOLLOW-UP RECOMMENDED.   CBC     Status: None   Collection Time: 05/30/15  4:43 AM  Result Value Ref Range   WBC 5.6 4.0 - 10.5 K/uL   RBC 4.23 3.87 - 5.11 MIL/uL   Hemoglobin 13.7 12.0 - 15.0 g/dL   HCT 41.6 36.0 - 46.0 %   MCV 98.3 78.0 - 100.0 fL   MCH 32.4 26.0 - 34.0 pg   MCHC 32.9 30.0 - 36.0 g/dL   RDW 13.7 11.5 - 15.5 %   Platelets 160 150 - 400 K/uL  Basic metabolic panel     Status: Abnormal   Collection Time: 05/30/15  4:43 AM  Result Value Ref Range   Sodium 138 135 - 145 mmol/L   Potassium 3.5 3.5 - 5.1 mmol/L   Chloride 103 101 - 111 mmol/L   CO2 28 22 - 32 mmol/L   Glucose, Bld 92 65 - 99 mg/dL   BUN <5 (L) 6 - 20 mg/dL   Creatinine, Ser 0.65  0.44 - 1.00 mg/dL   Calcium 9.0 8.9 - 10.3 mg/dL   GFR calc non Af Amer >60 >60 mL/min   GFR calc Af Amer >60 >60 mL/min    Comment: (NOTE) The eGFR has been calculated using the CKD EPI equation. This calculation has not been validated in all clinical situations. eGFR's persistently <60 mL/min signify possible Chronic Kidney Disease.    Anion gap 7 5 -  15    Medical Problem List and Plan: 1.  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 2.  H/o DVT/PE/Anticoagulation: Pharmaceutical: Xarelto 3. Chronic back pain/Pain Management: Discontinue IV morphine and will resume oxycodone (Pain mangement in [place]?). Need to use judiciously to avoid sedation/respiratory suppression.  4. Mood: Monitor for mood for now--patient lacks insight and awareness or deficits. LCSW to follow along and complete evaluation when appropriate.   5. Neuropsych: This patient is not capable of making decisions on her own behalf. 6. Skin/Wound Care: Routine pressure relief measures 7. Fluids/Electrolytes/Nutrition:  Monitor I/O. Check lytes in am. Offer supplements between meals.  8. HTN: Monitor BP bid. Lasix changed to HCTZ by primary team. 9. Asthma/COPD:  Encourage IS as able. Avoid narcotic and may need to schedule nebs to help manage wheezing/hypoxia.  10. Morbid obesity: Pressure relief measures. Heart Healthy diet.  11. Mild OSA:  Question OHS--may have CPAP at home--unknown as patient has been living with daughter for past 3-4 months.   12. Depression/ Anxiety disorder: Used Celexa and ativan at home. Resume Celexa.   Post Admission Physician Evaluation: 1. Functional deficits secondary  to L-MCA infarct. 2. Patient is admitted to receive collaborative, interdisciplinary care between the physiatrist, rehab nursing staff, and therapy team. 3. Patient's level of medical complexity and substantial therapy needs in context of that medical necessity cannot be provided at a lesser  intensity of care such as a SNF. 4. Patient has experienced substantial functional loss from his/her baseline which was documented above under the "Functional History" and "Functional Status" headings.  Judging by the patient's diagnosis, physical exam, and functional history, the patient has potential for functional progress which will result in measurable gains while on inpatient rehab.  These gains will be of substantial and practical use upon discharge  in facilitating mobility and self-care at the household level. 5. Physiatrist will provide 24 hour management of medical needs as well as oversight of the therapy plan/treatment and provide guidance as appropriate regarding the interaction of the two. 6. 24 hour rehab nursing will assist with safety, disease management, medication administration and patient education and help integrate therapy concepts, techniques,education, etc. 7. PT will assess and treat for/with: Lower extremity strength, range of motion, stamina, balance, functional mobility, safety, adaptive techniques and equipment, coping skills, stroke education.   Goals are: Supervision/Mod I. 8. OT will assess and treat for/with: ADL's, functional mobility, safety, upper extremity strength, adaptive techniques and equipment, ego support, and community reintegration.   Goals are: supervision/Mod I. Therapy may proceed with showering this patient. 9. SLP will assess and treat for/with: speech, language, swallowing, cognition.  Goals are: Min A/Supervision. 10. Case Management and Social Worker will assess and treat for psychological issues and discharge planning. 11. Team conference will be held weekly to assess progress toward goals and to determine barriers to discharge. 12. Patient will receive at least 3 hours of therapy per day at least 5 days per week. 13. ELOS: 16-19 days.       14. Prognosis:  good  Delice Lesch, MD 05/30/2015

## 2015-05-30 NOTE — Interval H&P Note (Signed)
Brooke [name] was admitted today to Inpatient Rehabilitation with the diagnosis of left MCA infarct.  The patient's history has been reviewed, patient examined, and there is no change in status.  Patient continues to be appropriate for intensive inpatient rehabilitation.  I have reviewed the patient's chart and labs.  Questions were answered to the patient's satisfaction. The PAPE has been reviewed and assessment remains appropriate.  Ankit Lorie Phenix 05/30/2015, 10:39 PM

## 2015-05-30 NOTE — Clinical Social Work Note (Signed)
CSW received consult for patient requesting information about Advanced Directives.  Patient's family member was given information about health care power of attorney and also given the phone number for [employer] to discuss financial power of attorney.  CSW explained to patient the process for completing Advanced Directive Form.  Patient's family member was very Patent attorney of CSW giving her information.  Patient will be discharging to CIR for inpatient rehab, CSW to sign off please reconsult if other social work needs arise.  Jones Broom. Leyna Vanderkolk, MSW, [place] 05/30/2015 11:39 AM

## 2015-05-30 NOTE — Progress Notes (Signed)
Subjective: Patient was seen and examined at bedside this morning. She has dysarthria and expressive aphasia but is able to understand what is said to her. She was talking a little more this morning, said "hello, how are you doing?" and "thank you." She is following verbal commands. When asked if she is still having chest pain, patient nodded yes. Unfortunately, she recently suffered a CVA causing significant expressive aphasia so it is difficult to illicit a good history of the chest pain.  Objective: Vital signs in last 24 hours: Filed Vitals:   05/29/15 2145 05/30/15 0141 05/30/15 0555 05/30/15 0924  BP: 130/68 117/73 141/73 101/75  Pulse: 80 72 67 78  Temp: 97.7 F (36.5 C) 98.2 F (36.8 C) 97.6 F (36.4 C) 98 F (36.7 C)  TempSrc: Oral Oral Oral Oral  Resp: 20 18 18 20   Height:      Weight:      SpO2: 98% 98% 100% 98%   Weight change:   Intake/Output Summary (Last 24 hours) at 05/30/15 1041 Last data filed at 05/30/15 [id number]  Gross per 24 hour  Intake    480 ml  Output    200 ml  Net    280 ml     Physical Exam  Constitutional: Obese female. She appears well-developed and well-nourished. No acute distress.  Eyes: EOM are normal.  Cardiovascular: Normal rate, regular rhythm and intact distal pulses.Chest wall is tender to palpation.  Pulmonary/Chest: Effort normal and breath sounds normal. No respiratory distress. She has no wheezes, rales, or rhonchi.  Abdominal: Soft. Bowel sounds are normal. She exhibits no distension. There is no tenderness.  Musculoskeletal: She exhibits no edema.  Neurological: She is alert.  Dysarthria. Expressive aphasia. Patient was following commands. Slight right-sided facial droop noted. Strength 3 /5 and no sensation in right upper extremity. Some improvement noted today as patient was able to lift her R arm up a little. No grip strength.  Strength and sensation intact in the left upper extremity. Strength 5/5 in bilateral lower  extremities. If was difficult to assess sensation.   Skin: Skin is warm and dry.   Lab Results: Basic Metabolic Panel:  Recent Labs Lab 05/28/15 1041 05/29/15 0528 05/30/15 0443  NA  --  137 138  K  --  3.6 3.5  CL  --  103 103  CO2  --  27 28  GLUCOSE  --  114* 92  BUN  --  <5* <5*  CREATININE  --  0.57 0.65  CALCIUM  --  8.6* 9.0  MG 2.0  --   --    Liver Function Tests:  Recent Labs Lab 05/26/15 0929  AST 24  ALT 13*  ALKPHOS 85  BILITOT 1.0  PROT 7.5  ALBUMIN 3.7   CBC:  Recent Labs Lab 05/26/15 0929  05/29/15 0528 05/30/15 0443  WBC 6.5  < > 5.7 5.6  NEUTROABS 3.4  --   --   --   HGB 15.2*  < > 12.8 13.7  HCT 47.7*  < > 38.7 41.6  MCV 100.6*  < > 98.2 98.3  PLT 215  < > 164 160  < > = values in this interval not displayed. CBG:  Recent Labs Lab 05/26/15 0859  GLUCAP 85   Fasting Lipid Panel:  Recent Labs Lab 05/27/15 0110  CHOL 139  HDL 54  LDLCALC 73  TRIG 62  CHOLHDL 2.6   Coagulation:  Recent Labs Lab 05/26/15 1145  LABPROT 19.1*  INR 1.61*   Anemia Panel: No results for input(s): VITAMINB12, FOLATE, FERRITIN, TIBC, IRON, RETICCTPCT in the last 168 hours. Urine Drug Screen: Drugs of Abuse     Component Value Date/Time   LABOPIA NONE DETECTED 05/26/2015 1320   COCAINSCRNUR NONE DETECTED 05/26/2015 1320   LABBENZ NONE DETECTED 05/26/2015 1320   AMPHETMU NONE DETECTED 05/26/2015 1320   THCU NONE DETECTED 05/26/2015 1320   LABBARB NONE DETECTED 05/26/2015 1320    Alcohol Level:  Recent Labs Lab 05/26/15 0929  ETH <5   Urinalysis:  Recent Labs Lab 05/26/15 1319  COLORURINE YELLOW  LABSPEC >1.046*  PHURINE 5.5  GLUCOSEU NEGATIVE  HGBUR NEGATIVE  BILIRUBINUR NEGATIVE  KETONESUR NEGATIVE  PROTEINUR NEGATIVE  NITRITE NEGATIVE  LEUKOCYTESUR NEGATIVE   Micro Results: No results found for this or any previous visit (from the past 240 hour(s)). Studies/Results: Dg Swallowing Func-speech Pathology  05/28/2015   Objective Swallowing Evaluation:   Patient Details [name] MRN: [id number] Date of Birth: 02-Apr-1956 Today's Date: 05/28/2015 Time: SLP Start Time (ACUTE ONLY): 1321-SLP Stop Time (ACUTE ONLY): 1335 SLP Time Calculation (min) (ACUTE ONLY): 14 min Past Medical History: Past Medical History Diagnosis Date . Hypertension  . Asthma  . COPD (chronic obstructive pulmonary disease) ([place])  . Anxiety  . Colon cancer ([place])    a. s/p surgery, chemotherapy. . Dyslipidemia  . Migraine headache  . Chronic bronchitis  . Uterine fibroid  . Ovarian cyst  . GERD (gastroesophageal reflux disease)  . Morbid obesity ([place])  . Obstructive sleep apnea    mild . Chronic pain  . Chronic back pain  . Arthritis  . Osteoarthritis  . Depression  . DVT (deep venous thrombosis) ([employer]) 02/2014 . 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 PCCM recommended lifelong anticoagulation. . Tobacco abuse  . PVC's (premature ventricular contractions) 2013 . Aortic stenosis    a. mild by echo 05/2015. Past Surgical History: Past Surgical History Procedure Laterality Date . Knee arthroscopy     bil. . Carpal tunnel release     rt . Mouth surgery     teeth extraction . Cesarean section   . Subtotal colectomy   . Colonoscopy   . Tubal ligation   HPI: 59 y.o. female brought in with acute receptive>>expressive aphasia. Woke up with stroke, with CT already showing question of early ischemia in distribution of the left middle cerebral artery.  Subjective: pt expressively aphasic Assessment / Plan / Recommendation CHL IP CLINICAL IMPRESSIONS 05/28/2015 Therapy Diagnosis Moderate oral phase dysphagia Clinical Impression Pt has a moderate oral dysphagia, although has good strength and timing of her pharyngeal phase. She has prolonged oral preparation and mastication, with small amounts of oral residue remaining after the swallow. Min cues provided for continued mastication with soft solid. Recommend initiation of Dys 1 textures and thin liquids, meds  crushed in puree. Impact on safety and function Mild aspiration risk   CHL IP TREATMENT RECOMMENDATION 05/28/2015 Treatment Recommendations Therapy as outlined in treatment plan below   Prognosis 05/28/2015 Prognosis for Safe Diet Advancement Good Barriers to Reach Goals -- Barriers/Prognosis Comment -- CHL IP DIET RECOMMENDATION 05/28/2015 SLP Diet Recommendations Dysphagia 1 (Puree) solids;Thin liquid Liquid Administration via Cup;Straw Medication Administration Crushed with puree Compensations Minimize environmental distractions;Slow rate;Small sips/bites;Lingual sweep for clearance of pocketing Postural Changes Seated upright at 90 degrees   CHL IP OTHER RECOMMENDATIONS 05/28/2015 Recommended Consults -- Oral Care Recommendations Oral care BID;Oral care before and after PO Other Recommendations --  CHL IP FOLLOW UP RECOMMENDATIONS 05/28/2015 Follow up Recommendations Inpatient Rehab   CHL IP FREQUENCY AND DURATION 05/28/2015 Speech Therapy Frequency (ACUTE ONLY) min 2x/week Treatment Duration 2 weeks      CHL IP ORAL PHASE 05/28/2015 Oral Phase Impaired Oral - Pudding Teaspoon -- Oral - Pudding Cup -- Oral - Honey Teaspoon -- Oral - Honey Cup -- Oral - Nectar Teaspoon -- Oral - Nectar Cup -- Oral - Nectar Straw -- Oral - Thin Teaspoon -- Oral - Thin Cup Weak lingual manipulation;Reduced posterior propulsion;Lingual/palatal residue Oral - Thin Straw Weak lingual manipulation;Reduced posterior propulsion;Lingual/palatal residue Oral - Puree Weak lingual manipulation;Reduced posterior propulsion;Lingual/palatal residue Oral - Mech Soft Weak lingual manipulation;Reduced posterior propulsion;Lingual/palatal residue;Impaired mastication Oral - Regular -- Oral - Multi-Consistency -- Oral - Pill -- Oral Phase - Comment --  CHL IP PHARYNGEAL PHASE 05/28/2015 Pharyngeal Phase WFL Pharyngeal- Pudding Teaspoon -- Pharyngeal -- Pharyngeal- Pudding Cup -- Pharyngeal -- Pharyngeal- Honey Teaspoon -- Pharyngeal -- Pharyngeal-  Honey Cup -- Pharyngeal -- Pharyngeal- Nectar Teaspoon -- Pharyngeal -- Pharyngeal- Nectar Cup -- Pharyngeal -- Pharyngeal- Nectar Straw -- Pharyngeal -- Pharyngeal- Thin Teaspoon -- Pharyngeal -- Pharyngeal- Thin Cup -- Pharyngeal -- Pharyngeal- Thin Straw -- Pharyngeal -- Pharyngeal- Puree -- Pharyngeal -- Pharyngeal- Mechanical Soft -- Pharyngeal -- Pharyngeal- Regular -- Pharyngeal -- Pharyngeal- Multi-consistency -- Pharyngeal -- Pharyngeal- Pill -- Pharyngeal -- Pharyngeal Comment --  CHL IP CERVICAL ESOPHAGEAL PHASE 05/28/2015 Cervical Esophageal Phase WFL Pudding Teaspoon -- Pudding Cup -- Honey Teaspoon -- Honey Cup -- Nectar Teaspoon -- Nectar Cup -- Nectar Straw -- Thin Teaspoon -- Thin Cup -- Thin Straw -- Puree -- Mechanical Soft -- Regular -- Multi-consistency -- Pill -- Cervical Esophageal Comment -- [name], M.A. CCC-SLP [phone] [name] 05/28/2015, 2:11 PM              Medications: I have reviewed the patient's current medications. Scheduled Meds: . amitriptyline  50 mg Oral QHS  . antiseptic oral rinse  7 mL Mouth Rinse q12n4p  . atorvastatin  40 mg Oral q1800  . chlorhexidine  15 mL Mouth Rinse BID  . citalopram  20 mg Oral Daily  . gabapentin  300 mg Oral QHS  . pantoprazole  40 mg Oral Daily  . potassium chloride SA  20 mEq Oral Daily  . rivaroxaban  20 mg Oral Q supper  . sodium chloride  10-40 mL Intracatheter Q12H  . sodium chloride  3 mL Intravenous Q12H   Continuous Infusions:   PRN Meds:.acetaminophen, ipratropium-albuterol, LORazepam, ondansetron **OR** ondansetron (ZOFRAN) IV, oxyCODONE-acetaminophen, sodium chloride, sodium chloride Assessment/Plan: Active Problems:   Hypokalemia   Chest pain   Stroke (HCC)   Acute ischemic stroke (HCC)   Aphasia   Weakness   Elevated troponin   Essential hypertension   Hyperkalemia   History of pulmonary embolism   History of DVT (deep vein thrombosis)   Global aphasia   Stroke with cerebral  ischemia (HCC)   Chronic obstructive pulmonary disease (HCC)   Hx of migraines   OSA (obstructive sleep apnea)   Hemiparesis, aphasia, and dysphagia as late effect of cerebrovascular accident (CVA) ([place])  Acute ischemic stroke Patient is presenting with acute onset right-sided weakness, right facial droop, dysarthria, and expressive aphasia secondary to an ischemic stroke involving a large area of the posterior aspect of the left MCA territory. Echo showing normal LV systolic function with an ejection fraction of 50-55% and no cardiac source of emboli. Carotid dopplers showing intimal wall thickening  CCA, mild mixed plaque origin ICA, and 1-39% ICA plaquing. Patient has been started on a NOAC and a statin. Neurology recommended ordering an TEE to rule out PFO since patient had an embolic stroke. A loop recorder to look for A-fib will be less useful because patient is already on anticoagulation with Coumadin. Cardiology believes at this point there is not a clear indication to proceed with TEE since she does need to be on lifelong anticoagulation for pulmonary indications of previous unprovoked DVT and bilateral pulmonary emboli. In addition, there is nothing in the history to suggest endocarditis. PT, OT, and SLP are recommending inpatient rehab. -Patient is being transferred to inpatient rehab today. I want to thank CIR for their help -I want to thank Neurology and Cardiology for their recommendations  -Aspirin discontinued today as patient was started on Xarelto 20 mg QD yesterday due to history of unprovoked DVT and PE -Ativan prn anxiety  -Lipitor 40 mg daily for secondary prevention  -Neuro checks   Chest pain Patient seems to be complaining of chest pain. Unfortunately, she recently suffered a CVA causing significant expressive aphasia so it is difficult to illicit a good history of the chest pain. Patient has no prior history of CAD or cath that can be seen in her records, but does have  significant risk factors, Heart Score 5 putting her at moderate risk of ACS.  Echo shows EF of 50-55%. EKG normal without ischemic changes. First troponin normal. Peak troponin 0.29, then trended down (0.18). There is no good explanation of her elevated troponin- no obvious causes of demand ischemia. Elevated troponin could likely be due to necrotic tissue in brain from recent ischemic stroke. PE is less likely as patient is not tachycardic and satting well on room air and was on heparin for history of DVT (now on Xarelto). Chest pain could be possibly due to GERD. Musculoskeletal pain is also on the differential because patient does grimace when her chest wall is palpated. Cardiology saw the patient and believe she is a poor candidate for cath at this time because of her aphasia and acute stroke; recommending conservative mgmt. They are recommending outpatient lexiscan at a later date since patient is going to rehab now.  -Thank you Cardiology for your recommendations  -Switched to PO Protonix 40 mg QD  -Tylenol for headache/ musculoskeletal pain  -Continue PO morphine prn  History of unprovoked DVT and PE: Records from [facility] October 2015 indicate patient had an unprovoked DVT and PE at that time and was started on lifelong anticoagulation with Coumadin.  -Continue Xarelto 20 mg QD  Hyperkalemia: Resolved. Potassium 5.6 on admission likely due to being on potassium supplement (patient was taking Lasix at home). K 3.5 today. -We were holding home med Lasix all this time and stopped it today  -Will continue KDur 20 meq for now as patient's K is low and request CIR to check her BMET 3 times a week -Discontinue Dextrose 5%-NS-KCl 13meq @ 77 cc/hr as patient is able to tolerate PO intake   COPD: Lung exam improved; no wheezes heard.  -DuoNeb every 6 hours  Hypertension: BP 101/75 today.  -Stopped home med Lasix and instead started patient on HCTZ 12.5 mg daily   Hyperlipidemia: Patient is not on  a statin at home. Lipid panel showing cholesterol 139, LDL 73, and HDL 54. -Lipitor 40 mg daily for secondary prevention  Diet: Dysphagia 1 diet as recommended by SLP  DVT prophylaxis: Xarelto   Code: Full  Dispo: Disposition is deferred at this time, awaiting improvement of current medical problems.  Anticipated discharge in approximately 2-3 day(s).   The patient does have a current PCP (Lorayne Marek, MD) and does need an [facility] hospital follow-up appointment after discharge.  The patient does not have transportation limitations that hinder transportation to clinic appointments.  .Services Needed at time of discharge: Y = Yes, Blank = No PT:   OT:   RN:   Equipment:   Other:     LOS: 4 days   Shela Leff, MD PGY 1 Pager [phone]

## 2015-05-30 NOTE — Progress Notes (Signed)
Patient was  Very drowsy this am when assisted OOB  to BSC   BP 143/71 O2SAT dropped to 67 then to 85 rA.  but O2sat drop to 67- 85 % then with Oxygen O2 sat up 100 %. Pt c/o  Mid chest  And medicated. No acute distress pt denied SOB . RN will continue to monitor and report any change from baseline assessement.

## 2015-05-30 NOTE — Progress Notes (Signed)
Retta Diones, RN Rehab Admission Coordinator Incomplete Physical Medicine and Rehabilitation PMR Pre-admission 05/30/2015 11:22 AM    Expand All Collapse All   Retta Diones, RN Rehab Admission Coordinator Signed Physical Medicine and Rehabilitation PMR Pre-admission 05/29/2015 3:04 PM  Related encounter: ED to Hosp-Admission (Current) from 05/26/2015 in [place] All Collapse All  PMR Admission Coordinator Pre-Admission Assessment  Patient: Brooke [name] is an 59 y.o., female MRN: [id number] DOB: September 12, 1955 Height: 4' 11"  (149.9 cm) Weight: 108.6 kg (239 lb 6.7 oz)  Insurance Information HMO: Yes PPO: PCP: IPA: 80/20: OTHER: Group #[id number] PRIMARY: [employer] Medicare Policy#: [id number] Subscriber: Karel Jarvis CM Name: Silvio Pate Phone#: [phone] X [phone] Fax#: [phone] Pre-Cert#: [id number] with update due after team conference and every 7 days Employer: Disabled/Not working Benefits: Phone #: [phone] Name: Automated Eff. Date: 06/09/14 Deduct: $0 Out of Pocket Max: $5500 (met $1177.30) Life Max: unlimited CIR: $275 days 1-7 SNF: $0 days 1-20; $160 days days 21-100 Outpatient: medical necessity Co-Pay: $40 copay Home Health: 100% Co-Pay: none DME: 80% Co-Pay: 20% Providers: in network  Medicaid Application Date: Case Manager:  Disability Application Date: Case Worker:   Emergency Facilities manager Information    Name Relation Home Work Mobile   [place] Daughter   [phone]     Current Medical History  Patient Admitting Diagnosis: L MCA infarct  History of Present Illness: A 59 y.o. female with history of  HTN, COPD, Colon CA, Migraines, OSA, morbid obesity, DVT/PE on coumadin who was admitted on 05/26/15 after being found by family with confusion, lethargy and difficulty speaking on 05/26/15. History taken from chart review. Last seen normal 11 pm the night before incident. CT brain with question of early ischemia in L-MCA distribution and INR- 1.61. CTA with confirmed large area of ischemia involving posterior left insular ribbon, superior left temporal lobe, and left parietal white. MRI/MRA brain revealed large L-MCA territory infarct with moderate to severe focal stenosis within more lateral L-M2 branch. She was started on IV heparin for treatment. 2 D echo with EF 50-55%, mild aortic stenosis, trivial MR and no wall abnormality. She developed chest pain with elevated troponin and cardiology consulted for input. Dr. Mare Ferrari felt elevated troponin could bed due to stroke v/s ACS and CP appeared musculoskeletal in nature per exam-doubt PE. Patient not a candidate for cardiac cath and to consider switching to NOAC as patient on lifelong anticoagulation. MBS done to evaluate dysphagia and patient started on dysphagia 1, thin liquids.    Total: 27=NIH  Past Medical History  Past Medical History  Diagnosis Date  . Hypertension   . Asthma   . COPD (chronic obstructive pulmonary disease) ([place])   . Anxiety   . Colon cancer ([place])     a. s/p surgery, chemotherapy.  . Dyslipidemia   . Migraine headache   . Chronic bronchitis   . Uterine fibroid   . Ovarian cyst   . GERD (gastroesophageal reflux disease)   . Morbid obesity ([place])   . Obstructive sleep apnea     mild  . Chronic pain   . Chronic back pain   . Arthritis   . Osteoarthritis   . Depression   . DVT (deep venous thrombosis) ([place]) 02/2014  . 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 PCCM  recommended lifelong anticoagulation.  . Tobacco abuse   . PVC's (premature ventricular contractions) 2013  . Aortic stenosis  a. mild by echo 05/2015.    Family History  family history includes Asthma in her child and child; Breast cancer in her maternal grandmother; Colon cancer in her maternal uncle; Diabetes in her father and sister; Hypertension in her father; Ovarian cancer in her mother; Stroke in her father; Uterine cancer in her mother.  Prior Rehab/Hospitalizations: No previous rehab admissions.  Has the patient had major surgery during 100 days prior to admission? No  Current Medications   Current facility-administered medications:  . acetaminophen (TYLENOL) suppository 650 mg, 650 mg, Rectal, Q8H PRN, Shela Leff, MD . amitriptyline (ELAVIL) tablet 50 mg, 50 mg, Oral, QHS, Alexa Sherral Hammers, MD . antiseptic oral rinse (CPC / CETYLPYRIDINIUM CHLORIDE 0.05%) solution 7 mL, 7 mL, Mouth Rinse, q12n4p, Oval Linsey, MD, 7 mL at 05/29/15 2244 . atorvastatin (LIPITOR) tablet 40 mg, 40 mg, Oral, q1800, Vickii Chafe, MD, 40 mg at 05/28/15 2101 . chlorhexidine (PERIDEX) 0.12 % solution 15 mL, 15 mL, Mouth Rinse, BID, Oval Linsey, MD, 15 mL at 05/29/15 2244 . citalopram (CELEXA) tablet 20 mg, 20 mg, Oral, Daily, Alexa Sherral Hammers, MD . gabapentin (NEURONTIN) capsule 300 mg, 300 mg, Oral, QHS, Alexa Sherral Hammers, MD . ipratropium-albuterol (DUONEB) 0.5-2.5 (3) MG/3ML nebulizer solution 3 mL, 3 mL, Nebulization, Q6H PRN, Vickii Chafe, MD, 3 mL at 05/29/15 0841 . LORazepam (ATIVAN) tablet 1 mg, 1 mg, Oral, BID PRN, Alexa Sherral Hammers, MD . ondansetron (ZOFRAN) tablet 4 mg, 4 mg, Oral, Q6H PRN **OR** ondansetron (ZOFRAN) injection 4 mg, 4 mg, Intravenous, Q6H PRN, Vickii Chafe, MD, 4 mg at 05/28/15 0636 . oxyCODONE-acetaminophen (PERCOCET) 7.5-325 MG per tablet 1 tablet, 1 tablet, Oral, Q6H PRN, Alexa Sherral Hammers, MD .  pantoprazole (PROTONIX) EC tablet 40 mg, 40 mg, Oral, Daily, Alexa Sherral Hammers, MD . potassium chloride SA (K-DUR,KLOR-CON) CR tablet 20 mEq, 20 mEq, Oral, Daily, Alexa Sherral Hammers, MD . rivaroxaban (XARELTO) tablet 20 mg, 20 mg, Oral, Q supper, Iline Oven, MD . sodium chloride 0.9 % injection 10-40 mL, 10-40 mL, Intracatheter, Q12H, Jola Schmidt, MD, 20 mL at 05/28/15 1000 . sodium chloride 0.9 % injection 10-40 mL, 10-40 mL, Intracatheter, PRN, Jola Schmidt, MD, 10 mL at 05/28/15 2259 . sodium chloride 0.9 % injection 10-40 mL, 10-40 mL, Intracatheter, PRN, Vickii Chafe, MD, 10 mL at 05/29/15 2225 . sodium chloride 0.9 % injection 3 mL, 3 mL, Intravenous, Q12H, Alexa Sherral Hammers, MD, 3 mL at 05/29/15 2248  Patients Current Diet: DIET - DYS 1 Room service appropriate?: Yes; Fluid consistency:: Thin  Precautions / Restrictions Precautions Precautions: Fall Precaution Comments: R sided neglect Restrictions Weight Bearing Restrictions: No   Has the patient had 2 or more falls or a fall with injury in the past year?Yes. 2 falls in the last year resulting in "soreness".  Prior Activity Level Community (5-7x/wk): Went out daily. Picked up grandchildren from school.  Home Assistive Devices / Equipment Home Assistive Devices/Equipment: Kasandra Knudsen (specify quad or straight)  Prior Device Use: Indicate devices/aids used by the patient prior to current illness, exacerbation or injury? Straight cane as needed.  Prior Functional Level Independent and driving. Used [place] but only as needed.  Self Care: Did the patient need help bathing, dressing, using the toilet or eating? Independent  Indoor Mobility: Did the patient need assistance with walking from room to room (with or without device)? Independent  Stairs: Did the patient need assistance with internal or external stairs (with or without device)? Independent  Functional Cognition: Did the patient need help planning  regular tasks such as shopping or remembering to take medications? Independent  Current Functional Level Cognition  Arousal/Alertness: Lethargic Overall Cognitive Status: Impaired/Different from baseline Difficult to assess due to: Impaired communication Current Attention Level: Sustained Orientation Level: Oriented to person, Oriented to place, Other (comment) (pt asleep but arousable ) Following Commands: Follows one step commands inconsistently Safety/Judgement: Decreased awareness of safety, Decreased awareness of deficits Attention: Focused, Sustained Focused Attention: Appears intact Sustained Attention: Impaired Sustained Attention Impairment: Verbal complex, Functional complex Memory: Appears intact Awareness: Impaired Awareness Impairment: Intellectual impairment, Emergent impairment, Anticipatory impairment Problem Solving: Appears intact Behaviors: Restless, Impulsive, Perseveration, Poor frustration tolerance Safety/Judgment: Impaired   Extremity Assessment (includes Sensation/Coordination)  Upper Extremity Assessment: RUE deficits/detail, LUE deficits/detail RUE Deficits / Details: all 2/5; pt reports numbness/tingling - Difficult to assess due to pt's aphasia, inability to follow commands consistently and attention deficits.  RUE Sensation: decreased light touch, decreased proprioception RUE Coordination: decreased fine motor, decreased gross motor LUE Deficits / Details: all 3/5 - difficult to assess due to pt's inability to follow commands consistently and attention deficits LUE Coordination: decreased gross motor, decreased fine motor  Lower Extremity Assessment: Defer to PT evaluation    ADLs  Overall ADL's : Needs assistance/impaired Grooming: Wash/dry face, Set up, Cueing for sequencing, Sitting Lower Body Bathing: Minimal assistance, +2 for safety/equipment, Cueing for sequencing, Sit to/from stand Upper Body Dressing : Set up, Cueing for  sequencing, Sitting Toilet Transfer: Minimal assistance, +2 for safety/equipment, Ambulation, BSC, Cueing for sequencing, Cueing for safety Toileting- Clothing Manipulation and Hygiene: Minimal assistance, Cueing for safety, Cueing for sequencing, Sit to/from stand Functional mobility during ADLs: Minimal assistance, +2 for safety/equipment, Rolling walker General ADL Comments: Pt very impulsive and with decreased attention. Mod verbal cues to initiate movement and to maintain safety while ambulating and completing ADLs. Pt spontaneously attempting to use RUE to reach for items and to wave "goodbye" but had increased difficulty when asked to use RUE. At end of session, pt attempted to get out of chair and climb into the bed despite max verbal and tactile cues to stop and wait for assistance.    Mobility  Overal bed mobility: Needs Assistance Bed Mobility: Supine to Sit, Sit to Supine Supine to sit: Min assist Sit to supine: Min assist General bed mobility comments: Min assist (hand held assist) for boost to come to sitting and for safety due to pt's impulsivity. HOB slightly elevated.    Transfers  Overall transfer level: Needs assistance Equipment used: Rolling walker (2 wheeled), 1 person hand held assist Transfers: Sit to/from Stand Sit to Stand: Min assist, +2 safety/equipment Stand pivot transfers: Min assist, +2 safety/equipment General transfer comment: Min assist +2 for safety due to pt's severe impulsivity, unsteadiness and R inattention. Pt unable to grip RW with R hand without assist and ambulated better without AD.    Ambulation / Gait / Stairs / Wheelchair Mobility  Ambulation/Gait Ambulation/Gait assistance: Museum/gallery curator (Feet): 20 Feet Assistive device: 1 person hand held assist, Rolling walker (2 wheeled) Gait Pattern/deviations: Step-through pattern General Gait Details: AMb to doorway with RW with inability to maintain R UE grip on RW.  At doorway, pt abruptly stopping and wanted to turn around. Eyes watering and think he may have been brightness of hallway. Performed another bout of gait with L HHA with MIN A in room. Decreased safety with turning due to IV and R side inattention.  Posture / Balance Balance Overall balance assessment: Needs assistance Sitting-balance support: No upper extremity supported, Feet supported Sitting balance-Leahy Scale: Fair Standing balance support: Single extremity supported, During functional activity Standing balance-Leahy Scale: Poor Standing balance comment: 1 UE assist to stand and complete pericare    Special needs/care consideration BiPAP/CPAP No CPM No Continuous Drip IV D5 and 0.9% NS with KCL 20 meq 75 ml/hr Dialysis No  Life Vest No Oxygen No Special Bed No Trach Size No Wound Vac (area) No  Skin Has eczema  Bowel mgmt: Last BM 05/25/15 Bladder mgmt: Voiding WDL Diabetic mgmt No    Previous Home Environment Living Arrangements: Children Home Care Services: No Additional Comments: Unsure of PLOF - no family present. Lives with daughter  Discharge Living Setting Plans for Discharge Living Setting: House, Lives with (comment) (Living with daughter for the past 3 months.) Type of Home at Discharge: House Discharge Home Layout: One level Discharge Home Access: Stairs to enter Entrance Stairs-Number of Steps: 1 + 1 step entry Does the patient have any problems obtaining your medications?: No  Social/Family/Support Systems Patient Roles: Parent (Has daughter, son, grandchildren.) Contact Information: Excell Seltzer - daughter [phone] Anticipated Caregiver: Daughter and granddaughter (46 yr old) Anticipated Caregiver's Contact Information: Jake Church - granddaughter [phone] Ability/Limitations of Caregiver: Daughter works 3rd shift. Granddaughter goes to school. Caregiver Availability: Other (Comment)  (Dtr assures me patient will have supervision at home.) Discharge Plan Discussed with Primary Caregiver: Yes Is Caregiver In Agreement with Plan?: Yes Does Caregiver/Family have Issues with Lodging/Transportation while Pt is in Rehab?: No  Goals/Additional Needs Patient/Family Goal for Rehab: PT/OT/ST mod I and supervision goals Expected length of stay: 19-20 days Cultural Considerations: None Dietary Needs: Dys 1, thin liquids Equipment Needs: TBD Pt/Family Agrees to Admission and willing to participate: Yes Program Orientation Provided & Reviewed with Pt/Caregiver Including Roles & Responsibilities: Yes  Decrease burden of Care through IP rehab admission: N/A  Possible need for SNF placement upon discharge: Not anticipated  Patient Condition: This patient's condition remains as documented in the consult dated 05/29/15, in which the Rehabilitation Physician determined and documented that the patient's condition is appropriate for intensive rehabilitative care in an inpatient rehabilitation facility. Will admit to inpatient rehab today.  Preadmission Screen Completed By: Retta Diones, 05/30/2015 10:02 AM ______________________________________________________________________  Discussed status with Dr. Posey Pronto on 05/30/15 at 1004 and received telephone approval for admission today.  Admission Coordinator: Retta Diones, time1004/Date12/21/16          Cosigned by: Ankit Lorie Phenix, MD at 05/30/2015 10:21 AM  Revision History     Date/Time User Provider Type Action   05/30/2015 10:21 AM Ankit Lorie Phenix, MD Physician Cosign   05/30/2015 10:05 AM Retta Diones, RN Rehab Admission Coordinator Sign

## 2015-05-30 NOTE — Progress Notes (Signed)
Pt transferred to inpatient rehab [phone]. Pt transferred via wheelchair with all belongings. PICC maintained and still infusing for transfer. Pt verbalized understanding of transfer. Pt's daughter notified of transfer and given room information.

## 2015-05-30 NOTE — Progress Notes (Signed)
Ankit Lorie Phenix, MD Physician Addendum Physical Medicine and Rehabilitation Consult Note 05/28/2015 4:54 PM  Related encounter: ED to Hosp-Admission (Current) from 05/26/2015 in [facility]        Physical Medicine and Rehabilitation Consult  Reason for Consult: Aphasia Referring Physician: Dr. Eppie Gibson    HPI: Brooke [name] is a 59 y.o. female with history of HTN, COPD, Colon CA, Migraines, OSA, morbid obesity, DVT/PE on coumadin who was admitted on 05/26/15 after being found by family with confusion, lethargy and difficulty speaking on 05/26/15. History taken from chart review. Last seen normal 11 pm the night before incident. CT brain with question of early ischemia in L-MCA distribution and INR- 1.61. CTA with confirmed large area of ischemia involving posterior left insular ribbon, superior left temporal lobe, and left parietal white. MRI/MRA brain revealed large L-MCA territory infarct with moderate to severe focal stenosis within more lateral L-M2 branch. She was started on IV heparin for treatment. 2 D echo with EF 50-55%, mild aortic stenosis, trivial MR and no wall abnormality. She developed chest pain with elevated troponin and cardiology consulted for input. Dr. Mare Ferrari felt elevated troponin could bed due to stroke v/s ACS and CP appeared musculoskeletal in nature per exam-doubt PE. Patient not a candidate for cardiac cath and to consider switching to NOAC as patient on lifelong anticoagulation. MBS done to evaluate dysphagia and patient started on dysphagia 1, thin liquids.   Review of Systems  Unable to perform ROS: language   Past Medical History  Diagnosis Date  . Hypertension   . Asthma   . COPD (chronic obstructive pulmonary disease) ([place])   . Anxiety   . Colon cancer ([place])     a. s/p surgery, chemotherapy.  . Dyslipidemia   . Migraine headache   . Chronic  bronchitis   . Uterine fibroid   . Ovarian cyst   . GERD (gastroesophageal reflux disease)   . Morbid obesity ([place])   . Obstructive sleep apnea     mild  . Chronic pain   . Chronic back pain   . Arthritis   . Osteoarthritis   . Depression   . DVT (deep venous thrombosis) ([place]) 02/2014  . 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 PCCM recommended lifelong anticoagulation.  . Tobacco abuse   . PVC's (premature ventricular contractions) 2013  . Aortic stenosis     a. mild by echo 05/2015.    Past Surgical History  Procedure Laterality Date  . Knee arthroscopy      bil.  . Carpal tunnel release      rt  . Mouth surgery      teeth extraction  . Cesarean section    . Subtotal colectomy    . Colonoscopy    . Tubal ligation      Family History  Problem Relation Age of Onset  . Uterine cancer Mother   . Stroke Father   . Colon cancer Maternal Uncle   . Diabetes Father   . Ovarian cancer Mother   . Hypertension Father   . Breast cancer Maternal Grandmother   . Diabetes Sister   . Asthma Child   . Asthma Child     Social History:  reports that she has been smoking Cigarettes. She has a 37 pack-year smoking history. She has never used smokeless tobacco. She reports that she does not drink alcohol or use illicit drugs.  Allergies  Allergen Reactions  . Kiwi Extract Anaphylaxis, Hives and Swelling    Makes her swell all over   . Aspirin Nausea Only    Can take as long as enteric coated    Medications Prior to Admission  Medication Sig Dispense Refill  . acetaminophen (TYLENOL) 500 MG tablet Take 500 mg by mouth every 6 (six) hours as needed for headache.    . albuterol (PROVENTIL HFA;VENTOLIN HFA) 108 (90 BASE) MCG/ACT inhaler Inhale 2 puffs into the lungs every 6 (six) hours as needed  for wheezing or shortness of breath (shortness of breath).    Marland Kitchen amitriptyline (ELAVIL) 50 MG tablet Take 50 mg by mouth at bedtime.    . citalopram (CELEXA) 20 MG tablet Take 20 mg by mouth daily.    . diphenhydrAMINE (BENADRYL) 25 MG tablet Take 1 tablet (25 mg total) by mouth every 6 (six) hours as needed (at first sign of headache). (Patient taking differently: Take 25 mg by mouth every 6 (six) hours as needed for itching or allergies. ) 10 tablet 0  . furosemide (LASIX) 20 MG tablet Take 1 tablet (20 mg total) by mouth daily. 30 tablet 2  . gabapentin (NEURONTIN) 300 MG capsule Take 1 capsule (300 mg total) by mouth at bedtime. 30 capsule 0  . LORazepam (ATIVAN) 1 MG tablet Take 1 tablet (1 mg total) by mouth every 6 (six) hours as needed for anxiety (anxiety). 30 tablet 0  . omeprazole (PRILOSEC) 40 MG capsule Take 1 capsule (40 mg total) by mouth daily. 30 capsule 3  . oxyCODONE-acetaminophen (PERCOCET) 7.5-325 MG tablet Take 1 tablet by mouth every 6 (six) hours as needed for moderate pain. pain  0  . potassium chloride SA (K-DUR,KLOR-CON) 20 MEQ tablet Take 1 tablet (20 mEq total) by mouth daily. 10 tablet 0  . warfarin (COUMADIN) 5 MG tablet Take 1 tablet (5 mg total) by mouth daily. 30 tablet 2  . B Complex Vitamins (VITAMIN-B COMPLEX PO) Take 1 tablet by mouth daily.    . cyclobenzaprine (FLEXERIL) 10 MG tablet Take 1 tablet (10 mg total) by mouth 2 (two) times daily as needed for muscle spasms. 20 tablet 0  . ipratropium-albuterol (DUONEB) 0.5-2.5 (3) MG/3ML SOLN Take 3 mLs by nebulization every 6 (six) hours. (Patient not taking: Reported on 05/26/2015) 360 mL 1    Home: Home Living Family/patient expects to be discharged to:: Private residence Living Arrangements: Children  Functional History:   Functional Status:  Mobility:          ADL:    Cognition: Cognition Overall Cognitive Status:  Impaired/Different from baseline Arousal/Alertness: Lethargic Orientation Level: Other (comment) (Pt unable to speak clearly, seems to be oriented) Attention: Focused, Sustained Focused Attention: Appears intact Sustained Attention: Impaired Sustained Attention Impairment: Verbal complex, Functional complex Memory: Appears intact Awareness: Impaired Awareness Impairment: Intellectual impairment, Emergent impairment, Anticipatory impairment Problem Solving: Appears intact Behaviors: Restless, Impulsive, Perseveration, Poor frustration tolerance Safety/Judgment: Impaired Cognition Overall Cognitive Status: Impaired/Different from baseline   Blood pressure 151/62, pulse 72, temperature 98 F (36.7 C), temperature source Oral, resp. rate 16, height 4\' 11"  (1.499 m), weight 108.6 kg (239 lb 6.7 oz), last menstrual period 05/24/2003, SpO2 100 %. Physical Exam  Vitals reviewed. Constitutional: She appears well-developed and well-nourished.  Obese  HENT:  Head: Normocephalic and atraumatic.  Eyes: Conjunctivae and EOM are normal.  Neck: Normal range of motion. Neck supple.  Cardiovascular: Normal rate and regular rhythm.  Respiratory: Effort normal and breath  sounds normal. No respiratory distress.  GI: Soft. Bowel sounds are normal. There is no tenderness.  Musculoskeletal: She exhibits no edema or tenderness.  PROM WNL  Neurological: She is alert. She has normal reflexes.  Fluent > nonfluent aphasia Unaware of deficits Occasionally follows 1 step commands Unable to assess MMT, sensation, CNs due to lack patient participation  Skin: Skin is warm and dry. No erythema.  Psychiatric: Her affect is blunt and inappropriate. Her speech is delayed and slurred. She is slowed. Cognition and memory are impaired. She expresses impulsivity. She is inattentive.     Lab Results Last 24 Hours    Results for orders placed or performed during the hospital encounter of 05/26/15 (from the past 24  hour(s))  Heparin level (unfractionated) Status: Abnormal   Collection Time: 05/27/15 8:49 PM  Result Value Ref Range   Heparin Unfractionated 0.75 (H) 0.30 - 0.70 IU/mL  Troponin I Status: None   Collection Time: 05/27/15 9:59 PM  Result Value Ref Range   Troponin I <0.03 <0.031 ng/mL  CBC Status: None   Collection Time: 05/28/15 6:03 AM  Result Value Ref Range   WBC 6.3 4.0 - 10.5 K/uL   RBC 4.15 3.87 - 5.11 MIL/uL   Hemoglobin 13.2 12.0 - 15.0 g/dL   HCT 40.7 36.0 - 46.0 %   MCV 98.1 78.0 - 100.0 fL   MCH 31.8 26.0 - 34.0 pg   MCHC 32.4 30.0 - 36.0 g/dL   RDW 13.8 11.5 - 15.5 %   Platelets 191 150 - 400 K/uL  Troponin I Status: Abnormal   Collection Time: 05/28/15 6:03 AM  Result Value Ref Range   Troponin I 0.12 (H) <0.031 ng/mL  Basic metabolic panel Status: Abnormal   Collection Time: 05/28/15 6:03 AM  Result Value Ref Range   Sodium 137 135 - 145 mmol/L   Potassium 3.4 (L) 3.5 - 5.1 mmol/L   Chloride 103 101 - 111 mmol/L   CO2 25 22 - 32 mmol/L   Glucose, Bld 98 65 - 99 mg/dL   BUN <5 (L) 6 - 20 mg/dL   Creatinine, Ser 0.64 0.44 - 1.00 mg/dL   Calcium 8.4 (L) 8.9 - 10.3 mg/dL   GFR calc non Af Amer >60 >60 mL/min   GFR calc Af Amer >60 >60 mL/min   Anion gap 9 5 - 15  Heparin level (unfractionated) Status: Abnormal   Collection Time: 05/28/15 6:03 AM  Result Value Ref Range   Heparin Unfractionated 0.95 (H) 0.30 - 0.70 IU/mL  Troponin I Status: Abnormal   Collection Time: 05/28/15 10:41 AM  Result Value Ref Range   Troponin I 0.24 (H) <0.031 ng/mL  Magnesium Status: None   Collection Time: 05/28/15 10:41 AM  Result Value Ref Range   Magnesium 2.0 1.7 - 2.4 mg/dL  Heparin level (unfractionated) Status: Abnormal   Collection Time: 05/28/15 2:35 PM  Result Value  Ref Range   Heparin Unfractionated <0.10 (L) 0.30 - 0.70 IU/mL      Imaging Results (Last 48 hours)    Mr Virgel Paling Wo Contrast  05/27/2015 CLINICAL DATA: Left MCA territory infarct. Abnormal speech. Facial droop. The examination had to be discontinued prior to completion due to patient unable to tolerate despite medication. EXAM: MRI HEAD WITHOUT CONTRAST MRA HEAD WITHOUT CONTRAST TECHNIQUE: Multiplanar, multiecho pulse sequences of the brain and surrounding structures were obtained without intravenous contrast. Angiographic images of the head were obtained using MRA technique without contrast. COMPARISON: CTA head and  neck, CT perfusion, and CT head 05/26/2015 FINDINGS: MRI HEAD FINDINGS The diffusion-weighted images confirm an acute infarct involving the posterior aspect of the left MCA territory. The posterior tooth there is the left insular cortex are involved. There is involvement of the posterior left frontal lobe, left parietal lobe, in the superior left temporal lobe. There is significant patient motion which could cause some artifact on the susceptibility weighted imaging. However, petechial hemorrhages suspected within the left temporal and parietal lobe infarct. MRA HEAD FINDINGS The MRA is also distorted by patient motion. Internal carotid arteries are within normal limits from the high cervical segments through the ICA termini bilaterally. The A1 and M1 segments are intact. The MCA bifurcations are intact. There is significant attenuation of MCA branch vessels bilaterally, distorted by patient motion. The left vertebral artery is the dominant vessel. The PICA origins are visualized and normal bilaterally. The basilar artery is within normal limits. Both posterior cerebral arteries originate from basilar tip. The posterior cerebral arteries are within normal limits bilaterally. IMPRESSION: 1. Confirmation of an acute infarct involving the posterior left MCA territory. 2. Probable petechial  hemorrhage within the infarct. 3. Significant attenuation of proximal MCA branch vessels bilaterally without a more proximal stenosis or occlusion. 4. The study is moderately degraded by patient motion, limiting evaluation of medium and distal small vessels. Electronically Signed By: San Morelle M.D. On: 05/27/2015 12:03   Mr Brain Wo Contrast  05/27/2015 CLINICAL DATA: Left MCA territory infarct. Abnormal speech. Facial droop. The examination had to be discontinued prior to completion due to patient unable to tolerate despite medication. EXAM: MRI HEAD WITHOUT CONTRAST MRA HEAD WITHOUT CONTRAST TECHNIQUE: Multiplanar, multiecho pulse sequences of the brain and surrounding structures were obtained without intravenous contrast. Angiographic images of the head were obtained using MRA technique without contrast. COMPARISON: CTA head and neck, CT perfusion, and CT head 05/26/2015 FINDINGS: MRI HEAD FINDINGS The diffusion-weighted images confirm an acute infarct involving the posterior aspect of the left MCA territory. The posterior tooth there is the left insular cortex are involved. There is involvement of the posterior left frontal lobe, left parietal lobe, in the superior left temporal lobe. There is significant patient motion which could cause some artifact on the susceptibility weighted imaging. However, petechial hemorrhages suspected within the left temporal and parietal lobe infarct. MRA HEAD FINDINGS The MRA is also distorted by patient motion. Internal carotid arteries are within normal limits from the high cervical segments through the ICA termini bilaterally. The A1 and M1 segments are intact. The MCA bifurcations are intact. There is significant attenuation of MCA branch vessels bilaterally, distorted by patient motion. The left vertebral artery is the dominant vessel. The PICA origins are visualized and normal bilaterally. The basilar artery is within normal limits. Both posterior  cerebral arteries originate from basilar tip. The posterior cerebral arteries are within normal limits bilaterally. IMPRESSION: 1. Confirmation of an acute infarct involving the posterior left MCA territory. 2. Probable petechial hemorrhage within the infarct. 3. Significant attenuation of proximal MCA branch vessels bilaterally without a more proximal stenosis or occlusion. 4. The study is moderately degraded by patient motion, limiting evaluation of medium and distal small vessels. Electronically Signed By: San Morelle M.D. On: 05/27/2015 12:03   Dg Swallowing Func-speech Pathology  05/28/2015 Objective Swallowing Evaluation: Patient Details [name] MRN: [id number] Date of Birth: 01/05/1956 Today's Date: 05/28/2015 Time: SLP Start Time (ACUTE ONLY): 1321-SLP Stop Time (ACUTE ONLY): 1335 SLP Time Calculation (min) (ACUTE ONLY): 14 min  Past Medical History: Past Medical History Diagnosis Date . Hypertension . Asthma . COPD (chronic obstructive pulmonary disease) ([place]) . Anxiety . Colon cancer ([place]) a. s/p surgery, chemotherapy. . Dyslipidemia . Migraine headache . Chronic bronchitis . Uterine fibroid . Ovarian cyst . GERD (gastroesophageal reflux disease) . Morbid obesity ([place]) . Obstructive sleep apnea mild . Chronic pain . Chronic back pain . Arthritis . Osteoarthritis . Depression . DVT (deep venous thrombosis) ([place]) 02/2014 . Bilateral pulmonary embolism ([place]) 02/2014 a. PCCM recommended lifelong anticoagulation. . Tobacco abuse . PVC's (premature ventricular contractions) 2013 . Aortic stenosis a. mild by echo 05/2015. Past Surgical History: Past Surgical History Procedure Laterality Date . Knee arthroscopy bil. . Carpal tunnel release rt . Mouth surgery teeth extraction . Cesarean section . Subtotal colectomy . Colonoscopy . Tubal ligation HPI: 59 y.o. female brought in with acute receptive>>expressive aphasia. Woke up with stroke,  with CT already showing question of early ischemia in distribution of the left middle cerebral artery. Subjective: pt expressively aphasic Assessment / Plan / Recommendation CHL IP CLINICAL IMPRESSIONS 05/28/2015 Therapy Diagnosis Moderate oral phase dysphagia Clinical Impression Pt has a moderate oral dysphagia, although has good strength and timing of her pharyngeal phase. She has prolonged oral preparation and mastication, with small amounts of oral residue remaining after the swallow. Min cues provided for continued mastication with soft solid. Recommend initiation of Dys 1 textures and thin liquids, meds crushed in puree. Impact on safety and function Mild aspiration risk CHL IP TREATMENT RECOMMENDATION 05/28/2015 Treatment Recommendations Therapy as outlined in treatment plan below Prognosis 05/28/2015 Prognosis for Safe Diet Advancement Good Barriers to Reach Goals -- Barriers/Prognosis Comment -- CHL IP DIET RECOMMENDATION 05/28/2015 SLP Diet Recommendations Dysphagia 1 (Puree) solids;Thin liquid Liquid Administration via Cup;Straw Medication Administration Crushed with puree Compensations Minimize environmental distractions;Slow rate;Small sips/bites;Lingual sweep for clearance of pocketing Postural Changes Seated upright at 90 degrees CHL IP OTHER RECOMMENDATIONS 05/28/2015 Recommended Consults -- Oral Care Recommendations Oral care BID;Oral care before and after PO Other Recommendations -- CHL IP FOLLOW UP RECOMMENDATIONS 05/28/2015 Follow up Recommendations Inpatient Rehab CHL IP FREQUENCY AND DURATION 05/28/2015 Speech Therapy Frequency (ACUTE ONLY) min 2x/week Treatment Duration 2 weeks CHL IP ORAL PHASE 05/28/2015 Oral Phase Impaired Oral - Pudding Teaspoon -- Oral - Pudding Cup -- Oral - Honey Teaspoon -- Oral - Honey Cup -- Oral - Nectar Teaspoon -- Oral - Nectar Cup -- Oral - Nectar Straw -- Oral - Thin Teaspoon -- Oral - Thin Cup Weak lingual manipulation;Reduced posterior  propulsion;Lingual/palatal residue Oral - Thin Straw Weak lingual manipulation;Reduced posterior propulsion;Lingual/palatal residue Oral - Puree Weak lingual manipulation;Reduced posterior propulsion;Lingual/palatal residue Oral - Mech Soft Weak lingual manipulation;Reduced posterior propulsion;Lingual/palatal residue;Impaired mastication Oral - Regular -- Oral - Multi-Consistency -- Oral - Pill -- Oral Phase - Comment -- CHL IP PHARYNGEAL PHASE 05/28/2015 Pharyngeal Phase WFL Pharyngeal- Pudding Teaspoon -- Pharyngeal -- Pharyngeal- Pudding Cup -- Pharyngeal -- Pharyngeal- Honey Teaspoon -- Pharyngeal -- Pharyngeal- Honey Cup -- Pharyngeal -- Pharyngeal- Nectar Teaspoon -- Pharyngeal -- Pharyngeal- Nectar Cup -- Pharyngeal -- Pharyngeal- Nectar Straw -- Pharyngeal -- Pharyngeal- Thin Teaspoon -- Pharyngeal -- Pharyngeal- Thin Cup -- Pharyngeal -- Pharyngeal- Thin Straw -- Pharyngeal -- Pharyngeal- Puree -- Pharyngeal -- Pharyngeal- Mechanical Soft -- Pharyngeal -- Pharyngeal- Regular -- Pharyngeal -- Pharyngeal- Multi-consistency -- Pharyngeal -- Pharyngeal- Pill -- Pharyngeal -- Pharyngeal Comment -- CHL IP CERVICAL ESOPHAGEAL PHASE 05/28/2015 Cervical Esophageal Phase WFL Pudding Teaspoon -- Pudding Cup -- Honey Teaspoon -- Honey Cup --  Nectar Teaspoon -- Nectar Cup -- Nectar Straw -- Thin Teaspoon -- Thin Cup -- Thin Straw -- Puree -- Mechanical Soft -- Regular -- Multi-consistency -- Pill -- Cervical Esophageal Comment -- [name], M.A. CCC-SLP [phone] [name] 05/28/2015, 2:11 PM     Assessment/Plan: Diagnosis: L-MCA infarct Labs and images independently reviewed. Records reviewed and summated above. Stroke: Continue secondary stroke prophylaxis and Risk Factor Modification listed below:  Antiplatelet therapy Blood Pressure Management: Continue current medication with prn's with permisive HTN per primary team Statin Agent Tobacco abuse: Council when  appropriate ?hemiparesis: fit for orthotics to prevent contractures (resting hand splint for day, wrist cock up splint at night, PRAFO, etc) if necessary  Motor recovery: Fluoxetine if necessary  1. Does the need for close, 24 hr/day medical supervision in concert with the patient's rehab needs make it unreasonable for this patient to be served in a less intensive setting? Yes  2. Co-Morbidities requiring supervision/potential complications: HTN (monitor and provide prns in accordance with increased physical exertion and pain), COPD (continue to monitor for SOB and tachypnea with increased physical exertion, monitor Sats), Migraines (ensure pain does not limit therapies), OSA (monitor for daytime fatigue, CPAP), morbid obesity (Body mass index is 48.33 kg/(m^2)., Cont diet and exercise education when appropriate, cont to encourage weight loss to increase endurance and promote overall health), DVT/PE (Cont to monitor RR, chest pain and meds), elevated troponins (cont to monitor chest pain) 3. Due to safety, disease management, medication administration and patient education, does the patient require 24 hr/day rehab nursing? Yes 4. Does the patient require coordinated care of a physician, rehab nurse, PT (1-2 hrs/day, 5 days/week), OT (1-2 hrs/day, 5 days/week) and SLP (1-2 hrs/day, 5 days/week) to address physical and functional deficits in the context of the above medical diagnosis(es)? Yes Addressing deficits in the following areas: balance, endurance, locomotion, strength, transferring, bathing, dressing, feeding, grooming, toileting, cognition, speech, language, swallowing and psychosocial support 5. Can the patient actively participate in an intensive therapy program of at least 3 hrs of therapy per day at least 5 days per week? Potentially 6. The potential for patient to make measurable gains while on inpatient rehab is excellent 7. Anticipated functional outcomes upon discharge from inpatient  rehab are Undetermined at present with PT, Undetermined at present with OT, Undetermined at present with SLP. 8. Estimated rehab length of stay to reach goals is: Possibly 19-20 days, however further information is needed  9. Does the patient have adequate social supports and living environment to accommodate these discharge functional goals? Uncertain 10. Anticipated D/C setting: To be determined 11. Anticipated post D/C treatments: HH therapy and Home excercise program 12. Overall Rehab/Functional Prognosis: good  RECOMMENDATIONS: This patient's condition is appropriate for continued rehabilitative care in the following setting: Possibly CIR, however patient's discharged disposition is unknown at present. In addition patient has not been evaluated by therapies and medical workup appears ongoing. We will await therapy evaluation, completion of medical workup, and verificatoin of discharge disposition prior to making determination for CIR. Will continue to follow   Patient has agreed to participate in recommended program. Uncertain Note that insurance prior authorization may be required for reimbursement for recommended care.  Comment: Rehab Admissions Coordinator to follow up  Delice Lesch, MD 05/28/2015      Revision History     Date/Time User Provider Type Action   05/29/2015 10:41 AM Ankit Lorie Phenix, MD Physician Addend   05/29/2015 10:39 AM Ankit Lorie Phenix, MD Physician Sign  05/28/2015 5:12 PM Bary Leriche, PA-C Physician Assistant Pend   View Details Report       Routing History     Date/Time From To Method   05/29/2015 10:41 AM Ankit Lorie Phenix, MD Ankit Lorie Phenix, MD In [facility]   05/29/2015 10:41 AM Ankit Lorie Phenix, MD Lorayne Marek, MD In Basket

## 2015-05-30 NOTE — Telephone Encounter (Signed)
error 

## 2015-05-30 NOTE — Discharge Summary (Signed)
[name] MRN: [id number] DOB: May 26, 1956 59 y.o. PCP: Lorayne Marek, MD  Date of Admission: 05/26/2015  8:42 AM Date of Discharge: 05/31/2015 Attending Physician: No att. providers found  Discharge Diagnosis:  Active Problems:   Hypokalemia   Chest pain   Stroke ([place])   Acute ischemic stroke ([place])   Aphasia   Weakness   Elevated troponin   Essential hypertension   Hyperkalemia   History of pulmonary embolism   History of DVT (deep vein thrombosis)   Global aphasia   Stroke with cerebral ischemia (HCC)   Chronic obstructive pulmonary disease (HCC)   Hx of migraines   OSA (obstructive sleep apnea)   Hemiparesis, aphasia, and dysphagia as late effect of cerebrovascular accident (CVA) ([place])  Discharge Medications:   Medication List    STOP taking these medications        cyclobenzaprine 10 MG tablet  Commonly known as:  FLEXERIL     diphenhydrAMINE 25 MG tablet  Commonly known as:  BENADRYL     furosemide 20 MG tablet  Commonly known as:  LASIX     omeprazole 40 MG capsule  Commonly known as:  PRILOSEC     warfarin 5 MG tablet  Commonly known as:  COUMADIN      TAKE these medications        acetaminophen 500 MG tablet  Commonly known as:  TYLENOL  Take 500 mg by mouth every 6 (six) hours as needed for headache.     albuterol 108 (90 BASE) MCG/ACT inhaler  Commonly known as:  PROVENTIL HFA;VENTOLIN HFA  Inhale 2 puffs into the lungs every 6 (six) hours as needed for wheezing or shortness of breath (shortness of breath).     amitriptyline 50 MG tablet  Commonly known as:  ELAVIL  Take 50 mg by mouth at bedtime.     antiseptic oral rinse 0.05 % Liqd solution  Commonly known as:  CPC / CETYLPYRIDINIUM CHLORIDE 0.05%  7 mLs by Mouth Rinse route 2 times daily at 12 noon and 4 pm.     atorvastatin 40 MG tablet  Commonly known as:  LIPITOR  Take 1 tablet (40 mg total) by mouth daily at 6 PM.     chlorhexidine 0.12 % solution  Commonly known  as:  PERIDEX  15 mLs by Mouth Rinse route 2 (two) times daily.     citalopram 20 MG tablet  Commonly known as:  CELEXA  Take 20 mg by mouth daily.     gabapentin 300 MG capsule  Commonly known as:  NEURONTIN  Take 1 capsule (300 mg total) by mouth at bedtime.     hydrochlorothiazide 12.5 MG tablet  Commonly known as:  HYDRODIURIL  Take 1 tablet (12.5 mg total) by mouth daily.     ipratropium-albuterol 0.5-2.5 (3) MG/3ML Soln  Commonly known as:  DUONEB  Take 3 mLs by nebulization every 6 (six) hours.     LORazepam 1 MG tablet  Commonly known as:  ATIVAN  Take 1 tablet (1 mg total) by mouth every 6 (six) hours as needed for anxiety (anxiety).     oxyCODONE-acetaminophen 7.5-325 MG tablet  Commonly known as:  PERCOCET  Take 1 tablet by mouth every 6 (six) hours as needed for moderate pain. pain     pantoprazole 40 MG tablet  Commonly known as:  PROTONIX  Take 1 tablet (40 mg total) by mouth daily.     potassium chloride SA 20 MEQ tablet  Commonly known as:  K-DUR,KLOR-CON  Take 1 tablet (20 mEq total) by mouth daily.     rivaroxaban 20 MG Tabs tablet  Commonly known as:  XARELTO  Take 1 tablet (20 mg total) by mouth daily with supper.     VITAMIN-B COMPLEX PO  Take 1 tablet by mouth daily.        Disposition and follow-up:   Brooke [name] was discharged from [facility] in Stable condition.  At the hospital follow up visit please address:  1.  Patient will need outpatient Lexiscan after rehab for further evaluation of unexplained chest pain.   2.  Labs / imaging needed at time of follow-up:   We continued KDur 20 meq upon discharge because K was slightly low during the hospitalization.  -Please check her BMET 3 times a week to monitor K  3.  Pending labs/ test needing follow-up:  Follow-up Appointments: Follow-up Information    Follow up with [facility]. Schedule an appointment as soon as possible for a  visit in 2 weeks.   Why:  Follow up   Contact information:   [address] [place] [phone]      Follow up with Warren Danes, MD. Schedule an appointment as soon as possible for a visit on 06/15/2015.   Specialty:  Cardiology   Why:  Hilda Blades scheduled appt for 06/15/15 at 1:30pm with Dr. Cecilie Kicks   Contact information:   [address] [place] [address] [phone]       Follow up with SETHI,PRAMOD, MD. Schedule an appointment as soon as possible for a visit in 2 months.   Specialties:  Neurology, Radiology   Why:  Follow up   Contact information:   [address] [place] [address] [phone]       Discharge Instructions: Discharge Instructions    Diet - low sodium heart healthy    Complete by:  As directed      Increase activity slowly    Complete by:  As directed            Consultations: Treatment Team:  Rounding Lbcardiology, MD  Procedures Performed:  Ct Angio Head W/cm &/or Wo Cm  05/26/2015  CLINICAL DATA:  Neck up stroke. Altered mental status. Unable to speak clearly. Right-sided weakness and facial droop. EXAM: CT ANGIOGRAPHY HEAD AND NECK TECHNIQUE: Multidetector CT imaging of the head and neck was performed using the standard protocol during bolus administration of intravenous contrast. Multiplanar CT image reconstructions and MIPs were obtained to evaluate the vascular anatomy. Carotid stenosis measurements (when applicable) are obtained utilizing NASCET criteria, using the distal internal carotid diameter as the denominator. CONTRAST:  38mL OMNIPAQUE IOHEXOL 350 MG/ML SOLN COMPARISON:  CT profusion study from the same day. CT head without contrast from the same day. FINDINGS: CT HEAD Brain: Source images confirm an acute infarct involving the posterior left insular ribbon is well is the cortex over the superior left temporal lobe and left parietal lobe. No acute hemorrhage or mass lesion  is present. The ganglia are intact. Calvarium and skull base: Negative. Paranasal sinuses: Clear Orbits: Within normal limits. CTA NECK Aortic arch: A 3 vessel arch configuration is present. Atherosclerotic calcifications are present without significant stenoses at the origins the great vessels. Right carotid system: The right common carotid artery is within normal limits. Minimal atherosclerotic changes are present at the carotid bifurcation. The cervical right ICA  is normal. Left carotid system: The left common carotid artery is within normal limits. Atherosclerotic changes are slightly more prominent within the left carotid bifurcation. There is no significant stenosis relative to the more distal vessel. The cervical left ICA is normal. Vertebral arteries:The vertebral arteries both originate from the subclavian arteries without significant stenosis. The left vertebral artery is dominant. There is no focal stenosis or vascular injury within the cervical vertebral arteries. Skeleton: Mild endplate degenerative changes are most noted at C3-4 and C4-5. No focal lytic or blastic lesions are present. Other neck: The soft tissues of the neck are unremarkable. CTA HEAD Anterior circulation: The internal carotid arteries are within normal limits from the high cervical segments through the ICA termini. The left A1 segment is dominant. The M1 segments are intact bilaterally. There is a significant proximal stenosis in the more lateral left M2 segment with significant attenuation of more distal branch vessels no focal proximal occlusion is present. Right MCA and bilateral ACA branch vessels are within normal limits. Posterior circulation: The left vertebral artery is slightly dominant to the right. The PICA origins are visualized and normal bilaterally. Basilar artery is within normal limits. Both posterior cerebral arteries originate the basilar tip. PCA branch vessels are intact. Venous sinuses: The dural sinuses are  patent. The right transverse sinus is dominant. The straight sinus and deep cerebral veins are patent. Cortical veins are intact. Anatomic variants: None Delayed phase: Postcontrast images better demonstrate the areas of developing acute nonhemorrhagic infarction involving the posterior left insular cortex, left temporal lobe, and left parietal lobe. No pathologic enhancement is evident. IMPRESSION: 1. Developing acute/subacute nonhemorrhagic infarct of posterior left insular cortex, superior left temporal, and left parietal lobe. 2. Moderate to severe focal stenosis within the more lateral left M2 branch with significant attenuation of distal MCA branch vessels corresponding to the infarct territory. 3. No other significant proximal stenosis, aneurysm, or branch vessel occlusion. These results were called by telephone at the time of interpretation on 05/26/2015 at 12:51 Pm to Dr. Aram Beecham, who verbally acknowledged these results. Electronically Signed   By: San Morelle M.D.   On: 05/26/2015 13:10   Ct Head Wo Contrast  05/26/2015  CLINICAL DATA:  Unresponsive EXAM: CT HEAD WITHOUT CONTRAST TECHNIQUE: Contiguous axial images were obtained from the base of the skull through the vertex without intravenous contrast. COMPARISON:  04/21/2015 FINDINGS: Bony calvarium is intact. No acute hemorrhage or space-occupying mass lesion is noted. There is some blunting of the sulcal markings in the left parietal lobe near the vertex which may represent some very early ischemia. No other focal abnormality is noted. IMPRESSION: Question early ischemia in distribution of the left middle cerebral artery. No other focal abnormality is noted. These results were called by telephone at the time of interpretation on 05/26/2015 at 9:11 am to Dr. Jola Schmidt , who verbally acknowledged these results. Electronically Signed   By: Inez Catalina M.D.   On: 05/26/2015 09:11   Ct Angio Neck W/cm &/or Wo/cm  05/26/2015  CLINICAL  DATA:  Neck up stroke. Altered mental status. Unable to speak clearly. Right-sided weakness and facial droop. EXAM: CT ANGIOGRAPHY HEAD AND NECK TECHNIQUE: Multidetector CT imaging of the head and neck was performed using the standard protocol during bolus administration of intravenous contrast. Multiplanar CT image reconstructions and MIPs were obtained to evaluate the vascular anatomy. Carotid stenosis measurements (when applicable) are obtained utilizing NASCET criteria, using the distal internal carotid diameter as the denominator. CONTRAST:  23mL  OMNIPAQUE IOHEXOL 350 MG/ML SOLN COMPARISON:  CT profusion study from the same day. CT head without contrast from the same day. FINDINGS: CT HEAD Brain: Source images confirm an acute infarct involving the posterior left insular ribbon is well is the cortex over the superior left temporal lobe and left parietal lobe. No acute hemorrhage or mass lesion is present. The ganglia are intact. Calvarium and skull base: Negative. Paranasal sinuses: Clear Orbits: Within normal limits. CTA NECK Aortic arch: A 3 vessel arch configuration is present. Atherosclerotic calcifications are present without significant stenoses at the origins the great vessels. Right carotid system: The right common carotid artery is within normal limits. Minimal atherosclerotic changes are present at the carotid bifurcation. The cervical right ICA is normal. Left carotid system: The left common carotid artery is within normal limits. Atherosclerotic changes are slightly more prominent within the left carotid bifurcation. There is no significant stenosis relative to the more distal vessel. The cervical left ICA is normal. Vertebral arteries:The vertebral arteries both originate from the subclavian arteries without significant stenosis. The left vertebral artery is dominant. There is no focal stenosis or vascular injury within the cervical vertebral arteries. Skeleton: Mild endplate degenerative changes  are most noted at C3-4 and C4-5. No focal lytic or blastic lesions are present. Other neck: The soft tissues of the neck are unremarkable. CTA HEAD Anterior circulation: The internal carotid arteries are within normal limits from the high cervical segments through the ICA termini. The left A1 segment is dominant. The M1 segments are intact bilaterally. There is a significant proximal stenosis in the more lateral left M2 segment with significant attenuation of more distal branch vessels no focal proximal occlusion is present. Right MCA and bilateral ACA branch vessels are within normal limits. Posterior circulation: The left vertebral artery is slightly dominant to the right. The PICA origins are visualized and normal bilaterally. Basilar artery is within normal limits. Both posterior cerebral arteries originate the basilar tip. PCA branch vessels are intact. Venous sinuses: The dural sinuses are patent. The right transverse sinus is dominant. The straight sinus and deep cerebral veins are patent. Cortical veins are intact. Anatomic variants: None Delayed phase: Postcontrast images better demonstrate the areas of developing acute nonhemorrhagic infarction involving the posterior left insular cortex, left temporal lobe, and left parietal lobe. No pathologic enhancement is evident. IMPRESSION: 1. Developing acute/subacute nonhemorrhagic infarct of posterior left insular cortex, superior left temporal, and left parietal lobe. 2. Moderate to severe focal stenosis within the more lateral left M2 branch with significant attenuation of distal MCA branch vessels corresponding to the infarct territory. 3. No other significant proximal stenosis, aneurysm, or branch vessel occlusion. These results were called by telephone at the time of interpretation on 05/26/2015 at 12:51 Pm to Dr. Aram Beecham, who verbally acknowledged these results. Electronically Signed   By: San Morelle M.D.   On: 05/26/2015 13:10   Mr Jodene Nam Head Wo  Contrast  05/27/2015  CLINICAL DATA:  Left MCA territory infarct. Abnormal speech. Facial droop. The examination had to be discontinued prior to completion due to patient unable to tolerate despite medication. EXAM: MRI HEAD WITHOUT CONTRAST MRA HEAD WITHOUT CONTRAST TECHNIQUE: Multiplanar, multiecho pulse sequences of the brain and surrounding structures were obtained without intravenous contrast. Angiographic images of the head were obtained using MRA technique without contrast. COMPARISON:  CTA head and neck, CT perfusion, and CT head 05/26/2015 FINDINGS: MRI HEAD FINDINGS The diffusion-weighted images confirm an acute infarct involving the posterior aspect of the left  MCA territory. The posterior tooth there is the left insular cortex are involved. There is involvement of the posterior left frontal lobe, left parietal lobe, in the superior left temporal lobe. There is significant patient motion which could cause some artifact on the susceptibility weighted imaging. However, petechial hemorrhages suspected within the left temporal and parietal lobe infarct. MRA HEAD FINDINGS The MRA is also distorted by patient motion. Internal carotid arteries are within normal limits from the high cervical segments through the ICA termini bilaterally. The A1 and M1 segments are intact. The MCA bifurcations are intact. There is significant attenuation of MCA branch vessels bilaterally, distorted by patient motion. The left vertebral artery is the dominant vessel. The PICA origins are visualized and normal bilaterally. The basilar artery is within normal limits. Both posterior cerebral arteries originate from basilar tip. The posterior cerebral arteries are within normal limits bilaterally. IMPRESSION: 1. Confirmation of an acute infarct involving the posterior left MCA territory. 2. Probable petechial hemorrhage within the infarct. 3. Significant attenuation of proximal MCA branch vessels bilaterally without a more proximal  stenosis or occlusion. 4. The study is moderately degraded by patient motion, limiting evaluation of medium and distal small vessels. Electronically Signed   By: San Morelle M.D.   On: 05/27/2015 12:03   Mr Brain Wo Contrast  05/27/2015  CLINICAL DATA:  Left MCA territory infarct. Abnormal speech. Facial droop. The examination had to be discontinued prior to completion due to patient unable to tolerate despite medication. EXAM: MRI HEAD WITHOUT CONTRAST MRA HEAD WITHOUT CONTRAST TECHNIQUE: Multiplanar, multiecho pulse sequences of the brain and surrounding structures were obtained without intravenous contrast. Angiographic images of the head were obtained using MRA technique without contrast. COMPARISON:  CTA head and neck, CT perfusion, and CT head 05/26/2015 FINDINGS: MRI HEAD FINDINGS The diffusion-weighted images confirm an acute infarct involving the posterior aspect of the left MCA territory. The posterior tooth there is the left insular cortex are involved. There is involvement of the posterior left frontal lobe, left parietal lobe, in the superior left temporal lobe. There is significant patient motion which could cause some artifact on the susceptibility weighted imaging. However, petechial hemorrhages suspected within the left temporal and parietal lobe infarct. MRA HEAD FINDINGS The MRA is also distorted by patient motion. Internal carotid arteries are within normal limits from the high cervical segments through the ICA termini bilaterally. The A1 and M1 segments are intact. The MCA bifurcations are intact. There is significant attenuation of MCA branch vessels bilaterally, distorted by patient motion. The left vertebral artery is the dominant vessel. The PICA origins are visualized and normal bilaterally. The basilar artery is within normal limits. Both posterior cerebral arteries originate from basilar tip. The posterior cerebral arteries are within normal limits bilaterally. IMPRESSION: 1.  Confirmation of an acute infarct involving the posterior left MCA territory. 2. Probable petechial hemorrhage within the infarct. 3. Significant attenuation of proximal MCA branch vessels bilaterally without a more proximal stenosis or occlusion. 4. The study is moderately degraded by patient motion, limiting evaluation of medium and distal small vessels. Electronically Signed   By: San Morelle M.D.   On: 05/27/2015 12:03   Ct Cerebral Perfusion W/cm  05/26/2015  ADDENDUM REPORT: 05/26/2015 13:11 ADDENDUM: These results were called by telephone at the time of interpretation on 05/26/2015 at 12:51 pm to Dr. Aram Beecham, who verbally acknowledged these results. Electronically Signed   By: San Morelle M.D.   On: 05/26/2015 13:11  05/26/2015  CLINICAL DATA:  Altered mental status.  Mental SP. EXAM: CT CEREBRAL PERFUSION WITH CONTRAST TECHNIQUE: Dynamic imaging was performed following the pulse injection of contrast. Para metric maps were subsequently calculated. CONTRAST:  51mL OMNIPAQUE IOHEXOL 350 MG/ML SOLN COMPARISON:  CT head without contrast from the same date. FINDINGS: Being time to transit is ext the and cerebral blood flow is diminished in the superior left temporal lobe and left parietal lobe within the posterior left MCA distribution. This involves posterior aspect of the left insular ribbon. There is less profound decrease in cerebral blood volume within the same territory. Less than 1/3 of the left MCA territory constitutes core infarct. Parameters are within normal limits in the right hemisphere IMPRESSION: 1. Large area of ischemia involving the posterior aspect of the left MCA territory. This involves the posterior left insular ribbon, superior left temporal lobe, and left parietal white. 2. More subtle cerebral blood volume changes with the area of core infarct constituting less than 1/3 of the left MCA territory. Electronically Signed: By: San Morelle M.D. On: 05/26/2015  12:51   Dg Swallowing Func-speech Pathology  05/28/2015  Objective Swallowing Evaluation:   Patient Details [name] MRN: [id number] Date of Birth: 1956-03-09 Today's Date: 05/28/2015 Time: SLP Start Time (ACUTE ONLY): 1321-SLP Stop Time (ACUTE ONLY): 1335 SLP Time Calculation (min) (ACUTE ONLY): 14 min Past Medical History: Past Medical History Diagnosis Date . Hypertension  . Asthma  . COPD (chronic obstructive pulmonary disease) ([place])  . Anxiety  . Colon cancer ([place])    a. s/p surgery, chemotherapy. . Dyslipidemia  . Migraine headache  . Chronic bronchitis  . Uterine fibroid  . Ovarian cyst  . GERD (gastroesophageal reflux disease)  . Morbid obesity ([place])  . Obstructive sleep apnea    mild . Chronic pain  . Chronic back pain  . Arthritis  . Osteoarthritis  . Depression  . DVT (deep venous thrombosis) ([place]) 02/2014 . 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 PCCM recommended lifelong anticoagulation. . Tobacco abuse  . PVC's (premature ventricular contractions) 2013 . Aortic stenosis    a. mild by echo 05/2015. Past Surgical History: Past Surgical History Procedure Laterality Date . Knee arthroscopy     bil. . Carpal tunnel release     rt . Mouth surgery     teeth extraction . Cesarean section   . Subtotal colectomy   . Colonoscopy   . Tubal ligation   HPI: 59 y.o. female brought in with acute receptive>>expressive aphasia. Woke up with stroke, with CT already showing question of early ischemia in distribution of the left middle cerebral artery.  Subjective: pt expressively aphasic Assessment / Plan / Recommendation CHL IP CLINICAL IMPRESSIONS 05/28/2015 Therapy Diagnosis Moderate oral phase dysphagia Clinical Impression Pt has a moderate oral dysphagia, although has good strength and timing of her pharyngeal phase. She has prolonged oral preparation and mastication, with small amounts of oral residue remaining after the swallow. Min cues provided for continued mastication with soft solid.  Recommend initiation of Dys 1 textures and thin liquids, meds crushed in puree. Impact on safety and function Mild aspiration risk   CHL IP TREATMENT RECOMMENDATION 05/28/2015 Treatment Recommendations Therapy as outlined in treatment plan below   Prognosis 05/28/2015 Prognosis for Safe Diet Advancement Good Barriers to Reach Goals -- Barriers/Prognosis Comment -- CHL IP DIET RECOMMENDATION 05/28/2015 SLP Diet Recommendations Dysphagia 1 (Puree) solids;Thin liquid Liquid Administration via Cup;Straw Medication Administration Crushed with puree Compensations Minimize environmental distractions;Slow rate;Small sips/bites;Lingual sweep for clearance  of pocketing Postural Changes Seated upright at 90 degrees   CHL IP OTHER RECOMMENDATIONS 05/28/2015 Recommended Consults -- Oral Care Recommendations Oral care BID;Oral care before and after PO Other Recommendations --   CHL IP FOLLOW UP RECOMMENDATIONS 05/28/2015 Follow up Recommendations Inpatient Rehab   CHL IP FREQUENCY AND DURATION 05/28/2015 Speech Therapy Frequency (ACUTE ONLY) min 2x/week Treatment Duration 2 weeks      CHL IP ORAL PHASE 05/28/2015 Oral Phase Impaired Oral - Pudding Teaspoon -- Oral - Pudding Cup -- Oral - Honey Teaspoon -- Oral - Honey Cup -- Oral - Nectar Teaspoon -- Oral - Nectar Cup -- Oral - Nectar Straw -- Oral - Thin Teaspoon -- Oral - Thin Cup Weak lingual manipulation;Reduced posterior propulsion;Lingual/palatal residue Oral - Thin Straw Weak lingual manipulation;Reduced posterior propulsion;Lingual/palatal residue Oral - Puree Weak lingual manipulation;Reduced posterior propulsion;Lingual/palatal residue Oral - Mech Soft Weak lingual manipulation;Reduced posterior propulsion;Lingual/palatal residue;Impaired mastication Oral - Regular -- Oral - Multi-Consistency -- Oral - Pill -- Oral Phase - Comment --  CHL IP PHARYNGEAL PHASE 05/28/2015 Pharyngeal Phase WFL Pharyngeal- Pudding Teaspoon -- Pharyngeal -- Pharyngeal- Pudding Cup --  Pharyngeal -- Pharyngeal- Honey Teaspoon -- Pharyngeal -- Pharyngeal- Honey Cup -- Pharyngeal -- Pharyngeal- Nectar Teaspoon -- Pharyngeal -- Pharyngeal- Nectar Cup -- Pharyngeal -- Pharyngeal- Nectar Straw -- Pharyngeal -- Pharyngeal- Thin Teaspoon -- Pharyngeal -- Pharyngeal- Thin Cup -- Pharyngeal -- Pharyngeal- Thin Straw -- Pharyngeal -- Pharyngeal- Puree -- Pharyngeal -- Pharyngeal- Mechanical Soft -- Pharyngeal -- Pharyngeal- Regular -- Pharyngeal -- Pharyngeal- Multi-consistency -- Pharyngeal -- Pharyngeal- Pill -- Pharyngeal -- Pharyngeal Comment --  CHL IP CERVICAL ESOPHAGEAL PHASE 05/28/2015 Cervical Esophageal Phase WFL Pudding Teaspoon -- Pudding Cup -- Honey Teaspoon -- Honey Cup -- Nectar Teaspoon -- Nectar Cup -- Nectar Straw -- Thin Teaspoon -- Thin Cup -- Thin Straw -- Puree -- Mechanical Soft -- Regular -- Multi-consistency -- Pill -- Cervical Esophageal Comment -- [name], M.A. CCC-SLP [phone] [name] 05/28/2015, 2:11 PM               2D Echo:  05/27/15 Study Conclusions  - Left ventricle: The cavity size was normal. Systolic function was normal. The estimated ejection fraction was in the range of 50% to 55%. Wall motion was normal; there were no regional wall motion abnormalities. Doppler parameters are consistent with abnormal left ventricular relaxation (grade 1 diastolic dysfunction). There was no evidence of elevated ventricular filling pressure by Doppler parameters. - Aortic valve: Transvalvular velocity was minimally increased. There was mild stenosis. Valve area (Vmax): 1.29 cm^2. - Aortic root: The aortic root was normal in size. - Mitral valve: There was trivial regurgitation. - Left atrium: The atrium was normal in size. - Right ventricle: Systolic function was normal. - Right atrium: The atrium was normal in size. - Tricuspid valve: There was mild regurgitation. - Pulmonic valve: There was no regurgitation. - Pulmonary  arteries: Systolic pressure was mildly to moderately increased. PA peak pressure: 40 mm Hg (S). - Inferior vena cava: The vessel was normal in size. - Pericardium, extracardiac: There was no pericardial effusion.  Impressions:  - No cardiac source of emboli was indentified.  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 History was obtained from the patient's daughter. Daughter states patient lives with her. States around 7:46 am,  family found patient to be confused, unable to talk, and was not responding to questions. They also noted she has facial droop. Daughter states patient's condition has stayed the same since morning and has not improved. States patient is able to understand words that are spoken to her, however, is struggling to reply because she cannot find the right words. Daughter states patient was in his usual state of health until 11:30 PM last night when she noticed noticed patient was "dozing off" and dropped the ice-cream she was eating. Daughter states she was not too concerned at that time because patient was not having any facial droop and was talking normally. She also recalls about 1 week ago patient was having a similar speech problem and needed help with walking/ putting on her clothes just for one day. As per daughter, patient has been using a spray in her nose that has opiates in which was not prescribed to her and she got it from someone else.   Note: Patient has expressive aphasia. Daughter wishes for the patient to be full code.   Hospital Course by problem list: Active Problems:   Hypokalemia   Chest pain   Stroke ([place])   Acute ischemic stroke (HCC)   Aphasia   Weakness   Elevated troponin   Essential hypertension   Hyperkalemia    History of pulmonary embolism   History of DVT (deep vein thrombosis)   Global aphasia   Stroke with cerebral ischemia (HCC)   Chronic obstructive pulmonary disease (HCC)   Hx of migraines   OSA (obstructive sleep apnea)   Hemiparesis, aphasia, and dysphagia as late effect of cerebrovascular accident (CVA) ([place])   Acute ischemic stroke Patient presented with acute onset right-sided weakness, right facial droop, dysarthria, and expressive aphasia secondary to an ischemic stroke involving a large area of the posterior aspect of the left MCA territory. MR angiogram showed moderate to severe focal stenosis within a lateral left M2 branch. Echo showing normal LV systolic function with an ejection fraction of 50-55% and no cardiac source of emboli. Carotid dopplers showing intimal wall thickening CCA, mild mixed plaque origin ICA, and 1-39% ICA plaquing. She was unable to swallow initially and Aspirin suppositories were started. Warfarin was changed to parenteral unfractionated heparin. Stroke team was on board. Patient's neurological function improved during the hospitalization and she was starting to talk again by the day of discharge. She was able to say a few words without dysarthria but continued to have expressive aphasia. Patient does not seem to have receptive aphasia and is mostly able to understand what is said to her. She follows commands appropriately. Swallowing function improved - patient was put on Dysphagia 1 diet recommended by speech therapy and oral medications were reinstituted. Parenteral unfractionated Heparin was discontinued and patient was started on Xarelto 20 mg daily. She was also started on Lipitor 40 mg daily. Patient still continues to have RUE weakness and loss of sensation. She was transferred to inpatient rehab on 05/30/15. Patient is to follow-up with primary care physician, neurology, and cardiology as outpatient.   Unexplained chest pain Patient seemed to be complaining of  chest pain during her hospital stay. Unfortunately, she recently suffered a CVA causing significant expressive aphasia so it was difficult to illicit a good history of the chest pain. Echo showing EF of 50-55%. EKG remained normal without ischemic changes. First troponin normal. Peak troponin 0.29, then trended down to 0.18. Suspicion for PE was low as patient was not tachycardic and  saturating well on room air. In addition, she was on heparin initially and then Xarelto for anticoagulation due to history of DVT and PE. She was given a PPI for GERD. Patient continued to have unexplained chest soreness during this hospitalization. There was no good explanation of her elevated troponin. Cardiology evaluated the patient and felt the troponins were elevated directly related to the stroke. They also felt she was a poor candidate for cath at this time because of her aphasia and acute stroke, and as such, recommneded conservative mgmt. Cardiology also recommended outpatient lexiscan at a later date since patient is going to inpatient rehab now.   History of unprovoked bilateral pulmonary emboli and lower extremity DVT one year ago Long-term anticoagulation is indicated. We elected to stop warfarin. She was covered temporarily with unfractionated heparin and then when she was able to swallow again, she was started on Xarelto 20 mg daily without a loading dose.    Discharge Vitals:   BP 132/73 mmHg  Pulse 72  Temp(Src) 97.6 F (36.4 C) (Oral)  Resp 20  Ht 4\' 11"  (1.499 m)  Wt 108.6 kg (239 lb 6.7 oz)  BMI 48.33 kg/m2  SpO2 99%  LMP 05/24/2003  Discharge Labs:  No results found for this or any previous visit (from the past 24 hour(s)).  Signed: Shela Leff, MD 05/31/2015, 1:34 PM    Services Ordered on Discharge:  Equipment Ordered on Discharge:

## 2015-05-30 NOTE — Progress Notes (Signed)
Rehab admissions - I have approval for acute inpatient rehab admission for today.  Bed available and will admit to rehab today.  Call me for questions.  #[phone] 

## 2015-05-30 NOTE — Progress Notes (Signed)
Patient ID: Brooke [name], female   DOB: [birth year], 57 y.o.   MRN: [id number] Medicine attending discharge note: I personally examined this patient on the day of discharge together with resident physician Dr. Shela Leff and I attest to the accuracy of her evaluation and management plan as recorded in her daily progress note dated 05/30/2015 which we discussed together.  Clinical summary: 59 year old obese, hypertensive, woman on chronic warfarin anticoagulation status post unprovoked bilateral pulmonary emboli and extensive right lower extremity DVT in October 2015 who presented on the day of the current admission 05/26/2015 with sudden onset of confusion, lethargy, and aphasia. Initial evaluation in the emergency department remarkable for blood pressure 129/93, partial receptive but complete expressive aphasia, right arm and right facial weakness. CT scan question early ischemia in the distribution of the left MCA. Color images showed a large infarct in that area. This was confirmed on MRI images showing an acute infarct involving the posterior aspect of the left MCA territory involving the left insular cortex, posterior left frontal lobe, left parietal lobe, and superior left temporal lobe.  Hospital course: This was initially felt to be an ischemic stroke. She was unable to swallow initially. Aspirin suppositories were started. Warfarin was changed to parenteral unfractionated heparin. She was evaluated by neurology and the stroke team. MR angiogram showed moderate to severe focal stenosis within a lateral left M2 branch. Echocardiogram with ejection fraction 50-55 percent. No obvious cardiac embolic source. Carotid Dopplers with no greater than 39% stenosis in the internal carotid arteries. Normal flow through the vertebral arteries. Electrocardiogram with normal sinus rhythm which was maintained for the entire hospital course. She showed signs of neurologic improvement slowly but surely  over the next few days and by time of hospital discharge she was starting to be able to talk again. She could say a few words without any dysarthria. Minimal receptive component to her aphasia. She followed almost all commands appropriately. Swallowing dysfunction improved. She was able to swallow pills and a dysphagia 1 diet was recommended by speech therapy. Oral medications were reinstituted. She had persistent right upper extremity weakness. Absent ability to move her right hand. When otherwise stable, she was evaluated by rehabilitation medicine team and felt to be appropriate for admission to inpatient rehabilitation.  Although she could not communicate verbally, she let it be known that she was having almost constant chest discomfort. Serial cardiograms remained normal. Troponin levels were elevated however up to a peak value of 0.29 on hospital day 2 then tapering down to 0.18 on hospital day 3. We had no other good explanation for the elevated troponins. We asked cardiology to evaluate the patient. Consultant felt that the troponins were elevated directly related to the stroke. Of note the patient continued to have unexplained chest soreness.  In view of the fact that she had unprovoked bilateral pulmonary emboli and lower extremity DVT one year ago, long-term anticoagulation is indicated. We elected to stop warfarin. She was covered temporarily with unfractionated heparin and then when she was able to swallow again, she was started on Xarelto 20 mg daily without a loading dose.  Disposition: Condition stable at time of discharge She will be readmitted same day to inpatient rehabilitation Post hospital [facility] and neurology.

## 2015-05-31 ENCOUNTER — Inpatient Hospital Stay (HOSPITAL_COMMUNITY): Payer: Medicare HMO | Admitting: Occupational Therapy

## 2015-05-31 ENCOUNTER — Inpatient Hospital Stay (HOSPITAL_COMMUNITY): Payer: Medicare HMO

## 2015-05-31 ENCOUNTER — Inpatient Hospital Stay (HOSPITAL_COMMUNITY): Payer: Medicare HMO | Admitting: Speech Pathology

## 2015-05-31 DIAGNOSIS — E8809 Other disorders of plasma-protein metabolism, not elsewhere classified: Secondary | ICD-10-CM

## 2015-05-31 DIAGNOSIS — E876 Hypokalemia: Secondary | ICD-10-CM

## 2015-05-31 LAB — COMPREHENSIVE METABOLIC PANEL
ALBUMIN: 2.9 g/dL — AB (ref 3.5–5.0)
ALT: 11 U/L — ABNORMAL LOW (ref 14–54)
ANION GAP: 8 (ref 5–15)
AST: 15 U/L (ref 15–41)
Alkaline Phosphatase: 66 U/L (ref 38–126)
BUN: 6 mg/dL (ref 6–20)
CHLORIDE: 101 mmol/L (ref 101–111)
CO2: 28 mmol/L (ref 22–32)
Calcium: 8.7 mg/dL — ABNORMAL LOW (ref 8.9–10.3)
Creatinine, Ser: 0.73 mg/dL (ref 0.44–1.00)
GFR calc Af Amer: >60 mL/min (ref >60)
GFR calc non Af Amer: >60 mL/min (ref >60)
GLUCOSE: 94 mg/dL (ref 65–99)
POTASSIUM: 3.2 mmol/L — AB (ref 3.5–5.1)
SODIUM: 137 mmol/L (ref 135–145)
TOTAL PROTEIN: 6.1 g/dL — AB (ref 6.5–8.1)
Total Bilirubin: 0.6 mg/dL (ref 0.3–1.2)

## 2015-05-31 LAB — CBC WITH DIFFERENTIAL/PLATELET
BASOS ABS: 0 10*3/uL (ref 0.0–0.1)
BASOS PCT: 0 %
Eosinophils Absolute: 0.2 10*3/uL (ref 0.0–0.7)
Eosinophils Relative: 4 %
HEMATOCRIT: 41.2 % (ref 36.0–46.0)
HEMOGLOBIN: 13.4 g/dL (ref 12.0–15.0)
LYMPHS PCT: 35 %
Lymphs Abs: 1.9 10*3/uL (ref 0.7–4.0)
MCH: 31.8 pg (ref 26.0–34.0)
MCHC: 32.5 g/dL (ref 30.0–36.0)
MCV: 97.9 fL (ref 78.0–100.0)
MONO ABS: 0.6 10*3/uL (ref 0.1–1.0)
Monocytes Relative: 10 %
NEUTROS ABS: 2.8 10*3/uL (ref 1.7–7.7)
NEUTROS PCT: 51 %
Platelets: 165 10*3/uL (ref 150–400)
RBC: 4.21 MIL/uL (ref 3.87–5.11)
RDW: 13.6 % (ref 11.5–15.5)
WBC: 5.4 10*3/uL (ref 4.0–10.5)

## 2015-05-31 MED ORDER — BENEPROTEIN PO POWD
1.0000 | Freq: Three times a day (TID) | ORAL | Status: DC
  Administered 2015-05-31 – 2015-06-08 (×8): 6 g via ORAL
  Filled 2015-05-31: qty 227

## 2015-05-31 NOTE — Discharge Instructions (Addendum)
Inpatient Rehab Discharge Instructions  Brooke [name] Discharge date and time:  06/08/15  Activities/Precautions/ Functional Status: Activity: no lifting, driving, or strenuous exercise till cleared by MD.  Diet: Soft foods. Needs supervision with meals Wound Care: none needed   Functional status:  ___ No restrictions     ___ Walk up steps independently _X__ 24/7 supervision/assistance   ___ Walk up steps with assistance ___ Intermittent supervision/assistance  ___ Bathe/dress independently ___ Walk with walker     _X__ Bathe/dress with assistance ___ Walk Independently    ___ Shower independently ___ Walk with assistance    ___ Shower with assistance _X__ No alcohol     ___ Return to work/school ________   COMMUNITY REFERRALS UPON DISCHARGE:   Home Health:   PT     OT     ST    RN  Agency:  [facility] Phone:  [phone] Medical Equipment/Items Ordered:  None needed - plans to use her shower seat from home  [place] PATIENT/FAMILY: Support Groups:  [facility] Stroke Support Group                              Meets the 2nd Thursday of each month at [facility]                              In the dayroom of Inpatient Rehab at [facility] Neuropsychology follow up:  If you would like to follow up with Dr. Marlane Hatcher, PsyD for cognitive testing in the future, she can be reached at:                                               [address], [place]  [id number]                                               [phone]  Special Instructions: 1.  Call Dr. Jarold Song for refills on all medications.  2. [facility] to provide PT, OT, ST and RN for follow up therapy.     STROKE/TIA DISCHARGE INSTRUCTIONS SMOKING Cigarette smoking nearly doubles your risk of having a stroke & is the single most alterable risk factor  If you smoke or have smoked in the last 12 months, you  are advised to quit smoking for your health.  Most of the excess cardiovascular risk related to smoking disappears within a year of stopping.  Ask you doctor about anti-smoking medications  [place] Quit Line: 1-800-QUIT NOW  Free Smoking Cessation Classes [phone]  CHOLESTEROL Know your levels; limit fat & cholesterol in your diet  Lipid Panel     Component Value Date/Time   CHOL 139 05/27/2015 0110   TRIG 62 05/27/2015 0110   HDL 54 05/27/2015 0110   CHOLHDL  2.6 05/27/2015 0110   VLDL 12 05/27/2015 0110   LDLCALC 73 05/27/2015 0110      Many patients benefit from treatment even if their cholesterol is at goal.  Goal: Total Cholesterol (CHOL) less than 160  Goal:  Triglycerides (TRIG) less than 150  Goal:  HDL greater than 40  Goal:  LDL (LDLCALC) less than 100   BLOOD PRESSURE American Stroke Association blood pressure target is less that 120/80 mm/Hg  Your discharge blood pressure is:  BP: 112/63 mmHg  Monitor your blood pressure  Limit your salt and alcohol intake  Many individuals will require more than one medication for high blood pressure  DIABETES (A1c is a blood sugar average for last 3 months) Goal HGBA1c is under 7% (HBGA1c is blood sugar average for last 3 months)  Diabetes: No known diagnosis of diabetes    Lab Results  Component Value Date   HGBA1C 5.6 05/26/2015     Your HGBA1c can be lowered with medications, healthy diet, and exercise.  Check your blood sugar as directed by your physician  Call your physician if you experience unexplained or low blood sugars.  PHYSICAL ACTIVITY/REHABILITATION Goal is 30 minutes at least 4 days per week  Activity: No driving, Therapies:  See above Return to work:  N/A  Activity decreases your risk of heart attack and stroke and makes your heart stronger.  It helps control your weight and blood pressure; helps you relax and can improve your mood.  Participate in a regular exercise program.  Talk with your  doctor about the best form of exercise for you (dancing, walking, swimming, cycling).  DIET/WEIGHT Goal is to maintain a healthy weight  Your discharge diet is: DIET DYS 3 Room service appropriate?: Yes; Fluid consistency:: Thin liquids Your height is:   Your current weight is: Weight: 98.793 kg (217 lb 12.8 oz) Your Body Mass Index (BMI) is:    Following the type of diet specifically designed for you will help prevent another stroke.  Your goal weight is:   Your goal Body Mass Index (BMI) is 19-24.  Healthy food habits can help reduce 3 risk factors for stroke:  High cholesterol, hypertension, and excess weight.  RESOURCES Stroke/Support Group:  Call [phone]   STROKE EDUCATION PROVIDED/REVIEWED AND GIVEN TO PATIENT Stroke warning signs and symptoms How to activate emergency medical system (call 911). Medications prescribed at discharge. Need for follow-up after discharge. Personal risk factors for stroke. Pneumonia vaccine given:  Flu vaccine given:  My questions have been answered, the writing is legible, and I understand these instructions.  I will adhere to these goals & educational materials that have been provided to me after my discharge from the hospital.      My questions have been answered and I understand these instructions. I will adhere to these goals and the provided educational materials after my discharge from the hospital.  Patient/Caregiver Signature _______________________________ Date __________  Clinician Signature _______________________________________ Date __________  Please bring this form and your medication list with you to all your follow-up doctor's appointments.       Information on my medicine - XARELTO (Rivaroxaban)  This medication education was reviewed with me or my healthcare representative as part of my discharge preparation.    Why was Xarelto prescribed for you? Xarelto was prescribed for you to reduce the risk of a blood clot  forming that can cause a stroke if you have a medical condition called atrial fibrillation (a type of irregular heartbeat).  What do you need to know about xarelto ? Take your Xarelto 20 mg ONCE DAILY at the same time every day with your evening meal. If you have difficulty swallowing the tablet whole, you may crush it and mix in applesauce just prior to taking your dose.  Take Xarelto exactly as prescribed by your doctor and DO NOT stop taking Xarelto without talking to the doctor who prescribed the medication.  Stopping without other stroke prevention medication to take the place of Xarelto may increase your risk of developing a clot that causes a stroke.  Refill your prescription before you run out.  After discharge, you should have regular check-up appointments with your healthcare provider that is prescribing your Xarelto.  In the future your dose may need to be changed if your kidney function or weight changes by a significant amount.  What do you do if you miss a dose? If you are taking Xarelto ONCE DAILY and you miss a dose, take it as soon as you remember on the same day then continue your regularly scheduled once daily regimen the next day. Do not take two doses of Xarelto at the same time or on the same day.   Important Safety Information A possible side effect of Xarelto is bleeding. You should call your healthcare provider right away if you experience any of the following: ? Bleeding from an injury or your nose that does not stop. ? Unusual colored urine (red or dark brown) or unusual colored stools (red or black). ? Unusual bruising for unknown reasons. ? A serious fall or if you hit your head (even if there is no bleeding).  Some medicines may interact with Xarelto and might increase your risk of bleeding while on Xarelto. To help avoid this, consult your healthcare provider or pharmacist prior to using any new prescription or non-prescription medications, including  herbals, vitamins, non-steroidal anti-inflammatory drugs (NSAIDs) and supplements.   This website has more information on Xarelto: [email or web address]

## 2015-05-31 NOTE — Evaluation (Signed)
Occupational Therapy Assessment and Plan  Patient Details  [name] MRN: [id number] Date of Birth: June 27, 1955  OT Diagnosis: cognitive deficits, hemiplegia affecting dominant side and muscle weakness (generalized) Rehab Potential: Rehab Potential (ACUTE ONLY): Good ELOS: 10-12 days   Today's Date: 05/31/2015 OT Individual Time: 1657-9038 OT Individual Time Calculation (min): 75 min     Problem List:  Patient Active Problem List   Diagnosis Date Noted  . 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 Stroke with cerebral ischemia ([place])   . Chronic obstructive pulmonary disease ([place])   . Hx of migraines   . OSA (obstructive sleep apnea)   . Hemiparesis, aphasia, and dysphagia as late effect of cerebrovascular accident (CVA) ([place])   . Elevated troponin 05/28/2015  . Essential hypertension 05/28/2015  . Hyperkalemia 05/28/2015  . History of pulmonary embolism 05/28/2015  . History of DVT (deep vein thrombosis) 05/28/2015  . Global aphasia   . Aphasia   . Weakness   . Stroke ([place]) 05/26/2015  . Acute ischemic stroke ([place]) 05/26/2015  . Altered mental status 04/21/2015  . Acute tonsillitis 06/25/2014  . Acute pulmonary embolism ([place]) 02/24/2014  . Acute deep vein thrombosis (DVT) of other specified vein of right lower extremity ([place]) 02/24/2014  . Respiratory failure ([place]) 02/23/2014  . DVT (deep venous thrombosis) ([place]) 02/16/2014  . DVT femoral (deep venous thrombosis) with thrombophlebitis ([place]) 02/16/2014  . Asthma, chronic 02/08/2014  . Obstructive sleep apnea 02/08/2014  . GERD (gastroesophageal reflux disease) 09/28/2013  . Chest pain 04/19/2013  . Obesity, Class III, BMI 40-49.9 (morbid obesity) ([place]) 08/23/2012  . Constipation 08/20/2012  . Headache(784.0) 08/20/2012  . Chronic pain syndrome 08/19/2012  . Hyperglycemia 08/19/2012  . Insomnia 08/19/2012  . Hypokalemia 10/21/2011  . Tobacco abuse 10/21/2011  . Arthritis  10/21/2011  . Anxiety 10/21/2011  . Depression 10/21/2011  . Esophageal reflux 07/30/2011  . Diarrhea following gastrointestinal surgery 07/30/2011  . PVC (premature ventricular contraction) 07/10/2011  . Morbid obesity ([place])   . Hypertension   . Dyslipidemia   . Personal history of malignant neoplasm of unspecified site in gastrointestinal tract 06/25/2011    Past Medical History:  Past Medical History  Diagnosis Date  . Hypertension   . Asthma   . COPD (chronic obstructive pulmonary disease) ([place])   . Anxiety   . Colon cancer ([place])     a. s/p surgery, chemotherapy.  . Dyslipidemia   . Migraine headache   . Chronic bronchitis   . Uterine fibroid   . Ovarian cyst   . GERD (gastroesophageal reflux disease)   . Morbid obesity ([place])   . Obstructive sleep apnea     mild  . Chronic pain   . Chronic back pain   . Arthritis   . Osteoarthritis   . Depression   . DVT (deep venous thrombosis) ([place]) 02/2014  . Bilateral pulmonary embolism ([place]) 02/2014    a. PCCM recommended lifelong anticoagulation.  . Tobacco abuse   . PVC's (premature ventricular contractions) 2013  . Aortic stenosis     a. mild by echo 05/2015.   Past Surgical History:  Past Surgical History  Procedure Laterality Date  . Knee arthroscopy      bil.  . Carpal tunnel release      rt  . Mouth surgery      teeth extraction  . Cesarean section    . Subtotal colectomy    . Colonoscopy    .  Tubal ligation      Assessment & Plan Clinical Impression: Patient is a 59 y.o. year old female female with history of HTN, COPD, Colon CA, Migraines, OSA, morbid obesity, DVT/PE on coumadin who was admitted on 05/26/15 after being found by family with confusion, lethargy and difficulty speaking on 05/26/15. History taken from chart review and daughter. Last seen normal 11 pm the night before incident. CT brain with question of early ischemia in L-MCA distribution and INR- 1.61. CTA with confirmed large area of ischemia  involving posterior left insular ribbon, superior left temporal lobe, and left parietal white. MRI/MRA brain revealed large L-MCA territory infarct with moderate to severe focal stenosis within more lateral L-M2 branch. She was started on IV heparin for treatment. 2 D echo with EF 50-55%, mild aortic stenosis, trivial MR and no wall abnormality. She developed chest pain with elevated troponin and cardiology consulted for input. Dr. Brackbill felt elevated troponin could bed due to stroke v/s ACS and CP appeared musculoskeletal in nature per exam-doubt PE. Patient not a candidate for cardiac cath and to consider switching to NOAC as patient on lifelong anticoagulation. MBS done to evaluate dysphagia and patient started on dysphagia 1, thin liquids due to difficulty handling oral bolus.   She did have episode of significant hypoxia with oxygen down to 60's this am that resolved with supplemental oxygen and continues to have complaints of chest discomfort felt to be due to MS symptoms. No further work up recommended. Patient with resultant right inattention, right sided weakness/apraxia, global aphasia affecting ability to follow commands, swallowing, insight into deficits as well as safety with mobility.  Patient transferred to CIR on 05/30/2015 .    Patient currently requires min to mod A with basic self-care skills and min A for basic mobility  secondary to muscle weakness, decreased cardiorespiratoy endurance, impaired timing and sequencing, unbalanced muscle activation, decreased coordination and decreased motor planning, ? visual deficits, decreased attention to right and decreased motor planning, decreased attention, decreased awareness, decreased problem solving, decreased safety awareness and decreased memory and decreased standing balance, hemiplegia and decreased balance strategies.  Prior to hospitalization, patient could complete ADL with modified independent .  Patient will benefit from skilled  intervention to decrease level of assist with basic self-care skills and increase independence with basic self-care skills prior to discharge home with care partner.  Anticipate patient will require intermittent supervision and follow up outpatient.  OT - End of Session Endurance Deficit: Yes Endurance Deficit Description: stumbled at top of stairs OT Assessment Rehab Potential (ACUTE ONLY): Good OT Patient demonstrates impairments in the following area(s): Balance;Behavior;Cognition;Endurance;Motor;Nutrition;Pain;Perception;Safety;Sensory;Skin Integrity;Vision OT Basic ADL's Functional Problem(s): Eating;Grooming;Bathing;Dressing;Toileting OT Transfers Functional Problem(s): Toilet;Tub/Shower OT Additional Impairment(s): Fuctional Use of Upper Extremity OT Plan OT Intensity: Minimum of 1-2 x/day, 45 to 90 minutes OT Frequency: 5 out of 7 days OT Duration/Estimated Length of Stay: 12-14 days OT Treatment/Interventions: Balance/vestibular training;Cognitive remediation/compensation;Community reintegration;Discharge planning;DME/adaptive equipment instruction;Disease mangement/prevention;Functional mobility training;Pain management;Neuromuscular re-education;Patient/family education;Self Care/advanced ADL retraining;Psychosocial support;Skin care/wound managment;Therapeutic Activities;UE/LE Strength taining/ROM;Therapeutic Exercise;UE/LE Coordination activities;Visual/perceptual remediation/compensation;Wheelchair propulsion/positioning OT Self Feeding Anticipated Outcome(s): supervision OT Basic Self-Care Anticipated Outcome(s): supervision OT Toileting Anticipated Outcome(s): supervision OT Bathroom Transfers Anticipated Outcome(s): supervision OT Recommendation Patient destination: Home Follow Up Recommendations: Outpatient OT Equipment Recommended: To be determined   Skilled Therapeutic Intervention 1:! OT eval initiated with OT goals, purpose and role discussed. Pt with difficulty  expressing a want/need of hers when came into the room.  Extensive time attempting to meet her need.   Able to transition to self care retraining at shower level. Focus on functional ambulation around room with min A, functional use of right hand with mod multimodal cuing, attention right side of body and environment with mod cuing, standing tolerance and endurance with seated rest breaks as needed, functional problem solving. Pt was able to recognize mistakes in language but required max to total A to correct the one word perseveration at times. Pt upset about diet and decr awareness of recommendation for pureed diet at this time despite different attempts to communicate this. Pt left in bed to rest after session.  OT Evaluation Precautions/Restrictions  Precautions Precautions: Fall Precaution Comments: R sided neglect Restrictions Weight Bearing Restrictions: No General Chart Reviewed: Yes Family/Caregiver Present: No Vital Signs   Pain Pain Assessment Pain Assessment: No/denies pain Faces Pain Scale: Hurts little more Pain Type: Acute pain Pain Location: Chest Pain Onset: On-going Pain Intervention(s): RN made aware Home Living/Prior Functioning Home Living Available Help at Discharge: Family, Available 24 hours/day Type of Home: House Home Access: Stairs to enter Technical brewer of Steps: 1+1 Entrance Stairs-Rails: None Home Layout: One level Bathroom Shower/Tub: Research officer, trade [employer] Accessibility: No Prior Function Level of Independence: Independent with gait  Able to Take Stairs?: Reciprically (10) Driving: Yes Vocation: Retired Leisure: Hobbies-yes (Comment) Comments: TV, slot games on computer ADL   Vision/Perception  Vision- Assessment Vision Assessment?: Yes Ocular Range of Motion: Within Functional Limits Alignment/Gaze Preference: Gaze left Tracking/Visual Pursuits: Decreased smoothness of horizontal tracking Saccades: Decreased speed of saccadic  movement Convergence: Impaired (comment)  Cognition Overall Cognitive Status: Impaired/Different from baseline Arousal/Alertness: Lethargic Orientation Level: Person;Place;Situation Person: Oriented Place: Oriented Situation: Oriented Year: 2016 Month: December Day of Week: Correct Immediate Memory Recall:  (unable to perform BIMS) Attention: Sustained Sustained Attention: Impaired Sustained Attention Impairment: Verbal basic;Functional complex Awareness: Impaired Awareness Impairment: Intellectual impairment;Emergent impairment Problem Solving: Appears intact Behaviors: Restless;Impulsive;Perseveration;Poor frustration tolerance Safety/Judgment: Impaired Sensation Sensation Light Touch: Impaired Detail Light Touch Impaired Details: Impaired RUE Proprioception: Impaired Detail Proprioception Impaired Details: Impaired RUE Coordination Gross Motor Movements are Fluid and Coordinated: Yes Fine Motor Movements are Fluid and Coordinated: No Finger Nose Finger Test: delayed Heel Shin Test: NT Motor  Motor Motor: Hemiplegia;Abnormal tone Motor - Skilled Clinical Observations: hypotonia Mobility  Bed Mobility Bed Mobility: Rolling Right;Right Sidelying to Sit Rolling Right: 6: Modified independent (Device/Increase time) Right Sidelying to Sit: 5: Supervision Transfers Transfers: Sit to Stand;Stand to Sit Sit to Stand: 4: Min assist Stand to Sit: 4: Min assist  Trunk/Postural Assessment  Cervical Assessment Cervical Assessment: Exceptions to [facility] (R rotation < L rotation; rests in L tilt) Thoracic Assessment Thoracic Assessment: Within Functional Limits Lumbar Assessment Lumbar Assessment: Within Functional Limits Postural Control Postural Control: Within Functional Limits  Balance Balance Balance Assessed: Yes Static Sitting Balance Static Sitting - Level of Assistance: 5: Stand by assistance Dynamic Sitting Balance Dynamic Sitting - Level of Assistance: 5: Stand  by assistance Static Standing Balance Static Standing - Level of Assistance: 5: Stand by assistance Dynamic Standing Balance Dynamic Standing - Level of Assistance: 4: Min assist Extremity/Trunk Assessment RUE Assessment RUE Assessment: Exceptions to [facility] RUE Strength RUE Overall Strength Comments: Brunstrom III RUE Tone RUE Tone: Mild LUE Assessment LUE Assessment: Within Functional Limits   See Function Navigator for Current Functional Status.   Refer to Care Plan for Long Term Goals  Recommendations for other services: None  Discharge Criteria: Patient will be discharged from OT if patient refuses treatment 3 consecutive times without  medical reason, if treatment goals not met, if there is a change in medical status, if patient makes no progress towards goals or if patient is discharged from hospital.  The above assessment, treatment plan, treatment alternatives and goals were discussed and mutually agreed upon: by patient  Smith, Jennifer Lynsey 05/31/2015, 12:11 PM  

## 2015-05-31 NOTE — Evaluation (Signed)
Speech Language Pathology Assessment and Plan  Patient Details  [name] MRN: [id number] Date of Birth: [birth year]  SLP Diagnosis: Aphasia;Cognitive Impairments;Apraxia;Dysphagia;Dysarthria  Rehab Potential: Good ELOS: 10-12 days     Today's Date: 05/31/2015 SLP Individual Time: 5188-4166 SLP Individual Time Calculation (min): 60 min   Problem List:  Patient Active Problem List   Diagnosis Date Noted  . 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 Stroke with cerebral ischemia ([place])   . Chronic obstructive pulmonary disease ([place])   . Hx of migraines   . OSA (obstructive sleep apnea)   . Hemiparesis, aphasia, and dysphagia as late effect of cerebrovascular accident (CVA) ([place])   . Elevated troponin 05/28/2015  . Essential hypertension 05/28/2015  . Hyperkalemia 05/28/2015  . History of pulmonary embolism 05/28/2015  . History of DVT (deep vein thrombosis) 05/28/2015  . Global aphasia   . Aphasia   . Weakness   . Stroke ([place]) 05/26/2015  . Acute ischemic stroke ([place]) 05/26/2015  . Altered mental status 04/21/2015  . Acute tonsillitis 06/25/2014  . Acute pulmonary embolism ([place]) 02/24/2014  . Acute deep vein thrombosis (DVT) of other specified vein of right lower extremity ([place]) 02/24/2014  . Respiratory failure ([place]) 02/23/2014  . DVT (deep venous thrombosis) ([place]) 02/16/2014  . DVT femoral (deep venous thrombosis) with thrombophlebitis ([place]) 02/16/2014  . Asthma, chronic 02/08/2014  . Obstructive sleep apnea 02/08/2014  . GERD (gastroesophageal reflux disease) 09/28/2013  . Chest pain 04/19/2013  . Obesity, Class III, BMI 40-49.9 (morbid obesity) ([place]) 08/23/2012  . Constipation 08/20/2012  . Headache(784.0) 08/20/2012  . Chronic pain syndrome 08/19/2012  . Hyperglycemia 08/19/2012  . Insomnia 08/19/2012  . Hypokalemia 10/21/2011  . Tobacco abuse 10/21/2011  . Arthritis 10/21/2011  . Anxiety 10/21/2011  . Depression  10/21/2011  . Esophageal reflux 07/30/2011  . Diarrhea following gastrointestinal surgery 07/30/2011  . PVC (premature ventricular contraction) 07/10/2011  . Morbid obesity ([place])   . Hypertension   . Dyslipidemia   . 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 Hypertension   . Asthma   . COPD (chronic obstructive pulmonary disease) ([place])   . Anxiety   . Colon cancer ([place])     a. s/p surgery, chemotherapy.  . Dyslipidemia   . Migraine headache   . Chronic bronchitis   . Uterine fibroid   . Ovarian cyst   . GERD (gastroesophageal reflux disease)   . Morbid obesity ([place])   . Obstructive sleep apnea     mild  . Chronic pain   . Chronic back pain   . Arthritis   . Osteoarthritis   . Depression   . DVT (deep venous thrombosis) ([place]) 02/2014  . Bilateral pulmonary embolism ([place]) 02/2014    a. PCCM recommended lifelong anticoagulation.  . Tobacco abuse   . PVC's (premature ventricular contractions) 2013  . Aortic stenosis     a. mild by echo 05/2015.   Past Surgical History:  Past Surgical History  Procedure Laterality Date  . Knee arthroscopy      bil.  . Carpal tunnel release      rt  . Mouth surgery      teeth extraction  . Cesarean section    . Subtotal colectomy    . Colonoscopy    . Tubal ligation      Assessment / Plan /  Recommendation Clinical Impression  Brooke [name] is a 59 y.o. female with history of HTN, COPD, Colon CA, Migraines, OSA, morbid obesity, DVT/PE on coumadin who was admitted on 05/26/15 after being found by family with confusion, lethargy and difficulty speaking on 05/26/15. CT brain with question of early ischemia in L-MCA distribution.MRI/MRA brain revealed large L-MCA territory infarct.  MBS done to evaluate dysphagia and patient started on dysphagia 1, thin liquids due to difficulty handling oral bolus. Patient with  resultant right inattention, right sided weakness/apraxia, global aphasia affecting ability to follow commands, swallowing, insight into deficits as well as safety with mobility.Pt admitted to CIR on 05/30/2015.  SLP evaluation completed on 05/31/2015 with the following results:  Pt presents with a moderate primarily oral dysphagia characterized by right sided labial, lingual, and buccal weakness which results in weakened manipulation and prolonged oral transit of boluses.  Pt was also noted with anterior spillage of both saliva and boluses due to decreased labial seal.  Impaired mastication noted during trials of solid textures due to the abovementioned deficits.  No overt s/s of aspiration evident with thin liquids via large, consecutive straw sips.  As a result, recommend that pt remain on dys 1 textures and thin liquids with full supervision for use of swallowing precautions.   Additionally, pt demonstrates a moderate global aphasia.  Pt exhibited difficulty following 2-step commands and answering semi-complex yes/no questions.  Her verbal expression was limited to the word level and was marked by difficulty naming basic objects and perseveration.  Her linguistic deficits are further exacerbated by a severe dysarthria characterized by imprecise articulation of consonants and decreased vocal intensity due oral motor deficits, as well as a verbal and oral apraxia.   Pt also exhibits significant cognitive deficits characterized by decreased sustained attention to tasks, poor intellectual awareness of deficits, impulsivity, and ?right inattention versus other visual disturbance which impact all higher level cognitive processes.  Pt has poor frustration tolerance as well with both communication and physical limitations.   Pt would benefit from skilled ST while inpatient in order to maximize functional independence and reduce burden of care prior to discharge.  Anticipate that pt will need close 24/7 supervision  at discharge in addition to SLP follow up at next level of care.    Skilled Therapeutic Interventions          Cognitive-linguistic and bedside swallow evaluation completed with results and recommendations reviewed with family.     SLP Assessment  Patient will need skilled Speech Lanaguage Pathology Services during CIR admission    Recommendations  SLP Diet Recommendations: Dysphagia 1 (Puree);Thin Liquid Administration via: Cup;Straw Medication Administration: Crushed with puree Supervision: Patient able to self feed;Full supervision/cueing for compensatory strategies Compensations: Minimize environmental distractions;Slow rate;Small sips/bites;Lingual sweep for clearance of pocketing Postural Changes and/or Swallow Maneuvers: Seated upright 90 degrees Oral Care Recommendations: Oral care BID Patient destination: Home Follow up Recommendations: Home Health SLP;Outpatient SLP;24 hour supervision/assistance Equipment Recommended: None recommended by SLP    SLP Frequency 3 to 5 out of 7 days   SLP Treatment/Interventions Cognitive remediation/compensation;Cueing hierarchy;Dysphagia/aspiration precaution training;Environmental controls;Functional tasks;Multimodal communication approach;Internal/external aids;Patient/family education   Pain Pain Assessment Pain Assessment: No/denies pain Prior Functioning Cognitive/Linguistic Baseline: Within functional limits Type of Home: House Available Help at Discharge: Family;Available 24 hours/day Vocation: Retired  Function:  Eating Eating   Modified Consistency Diet: Yes Eating Assist Level: Set up assist for;Supervision or verbal cues;Helper checks for pocketed food   Eating Set Up Assist For: Opening containers  Cognition Comprehension Comprehension assist level: Understands basic 50 - 74% of the time/ requires cueing 25 - 49% of the time  Expression   Expression assist level: Expresses basic 25 - 49% of the time/requires  cueing 50 - 75% of the time. Uses single words/gestures.  Social Interaction Social Interaction assist level: Interacts appropriately 50 - 74% of the time - May be physically or verbally inappropriate.  Problem Solving Problem solving assist level: Solves basic 25 - 49% of the time - needs direction more than half the time to initiate, plan or complete simple activities  Memory Memory assist level: Recognizes or recalls 25 - 49% of the time/requires cueing 50 - 75% of the time   Short Term Goals: Week 1: SLP Short Term Goal 1 (Week 1): Pt will be intelligible at the word level in 75% of opportunities with max assist verbal cues.  SLP Short Term Goal 2 (Week 1): Pt will follow 2 step commands during basic familiar tasks for 75% accuracy with max assist tactile cues.  SLP Short Term Goal 3 (Week 1): Pt will answer semi-complex yes/no questions for 75% accuracy with mod assist visual cues.   SLP Short Term Goal 4 (Week 1): Pt will name basic, familiar objects for 80% accuracy with mod assist verbal cues.  SLP Short Term Goal 5 (Week 1): Pt will sustain attention to a basic, familiar task for 2 minutes with mod assist verbal cues for redirection to task.   SLP Short Term Goal 6 (Week 1): Pt will consume trials of dys 2 textures with mod assist verbal cues to monitor and correct oral phase deficits over 3 targeted sessions.   Refer to Care Plan for Long Term Goals  Recommendations for other services: None  Discharge Criteria: Patient will be discharged from SLP if patient refuses treatment 3 consecutive times without medical reason, if treatment goals not met, if there is a change in medical status, if patient makes no progress towards goals or if patient is discharged from hospital.  The above assessment, treatment plan, treatment alternatives and goals were discussed and mutually agreed upon: by patient and by family  Jerardo Costabile, Selinda Orion 05/31/2015, 6:42 PM

## 2015-05-31 NOTE — Evaluation (Addendum)
Physical Therapy Assessment and Plan  Patient Details  [name] MRN: [id number] Date of Birth: November 08, 1955  PT Diagnosis: Abnormality of gait, Cognitive deficits, Hemiparesis dominant and Hypotonia, sensation deficits Rehab Potential: Good ELOS: 10-12  Today's Date: 05/31/2015 PT Individual Time: 0802-0903 PT Individual Time Calculation (min): 61 min    Problem List:  Patient Active Problem List   Diagnosis Date Noted  . 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 Stroke with cerebral ischemia ([place])   . Chronic obstructive pulmonary disease ([place])   . Hx of migraines   . OSA (obstructive sleep apnea)   . Hemiparesis, aphasia, and dysphagia as late effect of cerebrovascular accident (CVA) ([place])   . Elevated troponin 05/28/2015  . Essential hypertension 05/28/2015  . Hyperkalemia 05/28/2015  . History of pulmonary embolism 05/28/2015  . History of DVT (deep vein thrombosis) 05/28/2015  . Global aphasia   . Aphasia   . Weakness   . Stroke ([place]) 05/26/2015  . Acute ischemic stroke ([place]) 05/26/2015  . Altered mental status 04/21/2015  . Acute tonsillitis 06/25/2014  . Acute pulmonary embolism ([place]) 02/24/2014  . Acute deep vein thrombosis (DVT) of other specified vein of right lower extremity ([place]) 02/24/2014  . Respiratory failure ([place]) 02/23/2014  . DVT (deep venous thrombosis) ([place]) 02/16/2014  . DVT femoral (deep venous thrombosis) with thrombophlebitis ([place]) 02/16/2014  . Asthma, chronic 02/08/2014  . Obstructive sleep apnea 02/08/2014  . GERD (gastroesophageal reflux disease) 09/28/2013  . Chest pain 04/19/2013  . Obesity, Class III, BMI 40-49.9 (morbid obesity) ([place]) 08/23/2012  . Constipation 08/20/2012  . Headache(784.0) 08/20/2012  . Chronic pain syndrome 08/19/2012  . Hyperglycemia 08/19/2012  . Insomnia 08/19/2012  . Hypokalemia 10/21/2011  . Tobacco abuse 10/21/2011  . Arthritis 10/21/2011  . Anxiety 10/21/2011  .  Depression 10/21/2011  . Esophageal reflux 07/30/2011  . Diarrhea following gastrointestinal surgery 07/30/2011  . PVC (premature ventricular contraction) 07/10/2011  . Morbid obesity ([place])   . Hypertension   . Dyslipidemia   . Personal history of malignant neoplasm of unspecified site in gastrointestinal tract 06/25/2011    Past Medical History:  Past Medical History  Diagnosis Date  . Hypertension   . Asthma   . COPD (chronic obstructive pulmonary disease) ([place])   . Anxiety   . Colon cancer ([place])     a. s/p surgery, chemotherapy.  . Dyslipidemia   . Migraine headache   . Chronic bronchitis   . Uterine fibroid   . Ovarian cyst   . GERD (gastroesophageal reflux disease)   . Morbid obesity ([place])   . Obstructive sleep apnea     mild  . Chronic pain   . Chronic back pain   . Arthritis   . Osteoarthritis   . Depression   . DVT (deep venous thrombosis) ([place]) 02/2014  . Bilateral pulmonary embolism ([place]) 02/2014    a. PCCM recommended lifelong anticoagulation.  . Tobacco abuse   . PVC's (premature ventricular contractions) 2013  . Aortic stenosis     a. mild by echo 05/2015.   Past Surgical History:  Past Surgical History  Procedure Laterality Date  . Knee arthroscopy      bil.  . Carpal tunnel release      rt  . Mouth surgery      teeth extraction  . Cesarean section    . Subtotal colectomy    . Colonoscopy    . Tubal ligation  Assessment & Plan Clinical Impression: Brooke [name] is a 59 y.o. female with history of HTN, COPD, Colon CA, Migraines, OSA, morbid obesity, DVT/PE on coumadin who was admitted on 05/26/15 after being found by family with confusion, lethargy and difficulty speaking on 05/26/15. History taken from chart review and daughter. Last seen normal 11 pm the night before incident. CT brain with question of early ischemia in L-MCA distribution and INR- 1.61. CTA with confirmed large area of ischemia involving posterior left insular ribbon,  superior left temporal lobe, and left parietal white. MRI/MRA brain revealed large L-MCA territory infarct with moderate to severe focal stenosis within more lateral L-M2 branch. She was started on IV heparin for treatment. 2 D echo with EF 50-55%, mild aortic stenosis, trivial MR and no wall abnormality. She developed chest pain with elevated troponin and cardiology consulted for input. Dr. Mare Ferrari felt elevated troponin could bed due to stroke v/s ACS and CP appeared musculoskeletal in nature per exam-doubt PE. Patient transferred to CIR on 05/30/2015 .   Patient currently requires mod assist with mobility secondary to muscle joint tightness, decreased cardiorespiratoy endurance, impaired timing and sequencing and abnormal tone and decreased problem solving and decreased safety awareness.  Prior to hospitalization, patient was independent  with mobility and lived with family   in a 1 story  House home.  Home access is 1+1Stairs to enter, no rails.  Patient will benefit from skilled PT intervention to maximize safe functional mobility, minimize fall risk and decrease caregiver burden for planned discharge home with 24 hour supervision.  Anticipate patient will benefit from follow up [place] at discharge.  PT - End of Session Activity Tolerance: Decreased this session Endurance Deficit: Yes Endurance Deficit Description: stumbled at top of stairs PT Assessment Rehab Potential (ACUTE/IP ONLY): Good PT Patient demonstrates impairments in the following area(s): Balance;Behavior;Endurance;Motor;Safety PT Transfers Functional Problem(s): Bed Mobility;Bed to Chair;Car;Furniture PT Locomotion Functional Problem(s): Stairs PT Plan PT Intensity: Minimum of 1-2 x/day ,45 to 90 minutes PT Frequency: 5 out of 7 days PT Duration Estimated Length of Stay: 7-9 PT Treatment/Interventions: Ambulation/gait training;Balance/vestibular training;Community reintegration;Cognitive remediation/compensation;Discharge  planning;DME/adaptive equipment instruction;Functional mobility training;Neuromuscular re-education;Patient/family education;Psychosocial support;Stair training;Splinting/orthotics;Therapeutic Activities;Therapeutic Exercise;UE/LE Strength taining/ROM;UE/LE Coordination activities;Wheelchair propulsion/positioning PT Transfers Anticipated Outcome(s): modified independent PT Locomotion Anticipated Outcome(s): supervision gait x 150' , and up/down 12 steps 2 rails and 1 step no rail PT Recommendation Follow Up Recommendations: Home health PT Patient destination: Home (dtr's) Equipment Recommended: To be determined Equipment Details: owns a [employer] but rarely used it  Skilled Therapeutic Intervention- pt requested to use toilet.  Gait in room to /from toilet and sink.   Pt continent of B and B in toilet.  Pt refused to attempt pushing w/c.  Family reported pt had bil knee arthoscopic surgeries in the past, and had bil sciatica at times due to "a disc problems".  neuromuscular re-education: Pt attended to R visually during L hand task in sitting crossing midline to manipulate small discs and sort by color and number; with VCs she attempted to use R hand briefly. eval completed.  LTGs and ELOs explained to family and pt.  Pt shook her head when advised not to ambulate alone in room; family reinforced, but pt continued to shake head.  Family present when pt left resting in w/c at end of session.  PT Evaluation Precautions/Restrictions Precautions Precautions: Fall Precaution Comments: R sided neglect Restrictions Weight Bearing Restrictions: No General   Vital Signs  Pain Pain Assessment Pain Assessment: No/denies pain  Home Living/Prior Functioning Home Living Available Help at Discharge: Family;Available 24 hours/day Type of Home: House Home Access: Stairs to enter [employer] of Steps: 1+1 Entrance Stairs-Rails: None Home Layout: One level Bathroom Shower/Tub: Engineer, agricultural Accessibility: No Prior Function Level of Independence: Independent with gait  Able to Take Stairs?: Reciprically (10) Driving: Yes Vocation: Retired Leisure: Hobbies-yes (Comment) Comments: TV, slot games on computer Vision/Perception difficult to assess; see OT eval    Cognition Overall Cognitive Status: Impaired/Different from baseline Arousal/Alertness: Lethargic Orientation Level: Oriented to person;Oriented to place;Oriented to time;Oriented to situation Attention: Sustained Sensation Sensation Light Touch: pt communicated that RLE sensation dulled compared to LLE Proprioception: Appears Intact Coordination Gross Motor Movements are Fluid and Coordinated: Yes Fine Motor Movements are Fluid and Coordinated: No Heel Shin Test: NT Motor  Motor Motor: Hemiplegia;Abnormal tone Motor - Skilled Clinical Observations: hypotonia RLE and RUE Mobility Bed Mobility Bed Mobility: Rolling Right;Right Sidelying to Sit Rolling Right: 6: Modified independent (Device/Increase time) Right Sidelying to Sit: 5: Supervision Transfers Transfers: Yes Stand Pivot Transfers: 4: Min guard Locomotion  Ambulation Ambulation: Yes Ambulation/Gait Assistance: 4: Min guard Assistive device: None Maximum distance 50'  Gait Gait: Yes Gait Pattern: Impaired Gait Pattern: Decreased hip/knee flexion - right;Decreased dorsiflexion - right;Decreased trunk rotation Stairs / Additional Locomotion Stairs: Yes Stair Management Technique: Two rails;Alternating pattern on 3" high steps, step to on 6" step although pt self selected leading with R foot, and stumbled at top step requiring mod assist;Forwards Number of Stairs: 12 Height of Stairs: 6 (and 3) Wheelchair Mobility Wheelchair Mobility: No  Trunk/Postural Assessment  Cervical Assessment Cervical Assessment: Exceptions to [facility] (R rotation < L rotation; rests in L tilt) Thoracic Assessment Thoracic Assessment: Within Functional  Limits Lumbar Assessment Lumbar Assessment: Within Functional Limits Postural Control Postural Control: Within Functional Limits  Balance Balance Balance Assessed: Yes Static Sitting Balance Static Sitting - Level of Assistance: 5: Stand by assistance Dynamic Sitting Balance Dynamic Sitting - Level of Assistance: 5: Stand by assistance Static Standing Balance Static Standing - Level of Assistance: 5: Stand by assistance Dynamic Standing Balance Dynamic Standing - Level of Assistance: 4: Min assist Extremity Assessment      RLE Assessment RLE Assessment: Exceptions to [facility] RLE Strength RLE Overall Strength Comments: grossly in sitting: 3-/5 hip flexion, 3+/5 knee ext and ankle DF; difficult to assess due to aphasia LLE Assessment LLE Assessment: Within Functional Limits (tight hamstrings and heel cord)   See Function Navigator for Current Functional Status.   Refer to Care Plan for Long Term Goals  Recommendations for other services: None  Discharge Criteria: Patient will be discharged from PT if patient refuses treatment 3 consecutive times without medical reason, if treatment goals not met, if there is a change in medical status, if patient makes no progress towards goals or if patient is discharged from hospital.  The above assessment, treatment plan, treatment alternatives and goals were discussed and mutually agreed upon: by patient and by family  Lamir Racca 05/31/2015, 11:27 AM

## 2015-05-31 NOTE — Progress Notes (Signed)
[place] PHYSICAL MEDICINE & REHABILITATION     PROGRESS NOTE    Subjective/Complaints: Pt seen this AM sitting up in bed, eating breakfast.  She appears to be more comfortable this AM and indicates she had a good night last night.  ROS: Denies CP, SOB, n/v/d.  Objective: Vital Signs: Blood pressure 124/69, pulse 78, temperature 97.9 F (36.6 C), temperature source Oral, resp. rate 18, last menstrual period 05/24/2003, SpO2 99 %. No results found.  Recent Labs  05/30/15 0443 05/31/15 0428  WBC 5.6 5.4  HGB 13.7 13.4  HCT 41.6 41.2  PLT 160 165    Recent Labs  05/30/15 0443 05/31/15 0428  NA 138 137  K 3.5 3.2*  CL 103 101  GLUCOSE 92 94  BUN <5* 6  CREATININE 0.65 0.73  CALCIUM 9.0 8.7*   CBG (last 3)  No results for input(s): GLUCAP in the last 72 hours.  Wt Readings from Last 3 Encounters:  05/26/15 108.6 kg (239 lb 6.7 oz)  04/22/15 110.814 kg (244 lb 4.8 oz)  11/21/14 104.781 kg (231 lb)    Physical Exam:  BP 124/69 mmHg  Pulse 78  Temp(Src) 97.9 F (36.6 C) (Oral)  Resp 18  SpO2 99%  LMP 05/24/2003 Constitutional: She appears well-developed and well-nourished. Vital signs reviewed.  Obese female.  HENT: Normocephalic and atraumatic. Eyes: Conjunctivae and EOM are normal.  Neck: Normal range of motion. Neck supple.  Cardiovascular: Normal rate and regular rhythm.  Respiratory: Effort normal. No respiratory distress. She has wheezes.  GI: Soft. Bowel sounds are normal. She exhibits no distension. There is no tenderness.  Musculoskeletal: She exhibits no edema or tenderness.  Neurological: She is alert.  Transcortical Fluent > nonfluent aphasia with severe dysarthria and apraxia.  Has right inattention with right body neglect.  Impulsive and lacks insight/awareness of deficits.  Sensation appears to be diminished RLE Motor: LUE/LLE: Appears to be 4/5 proximal to distal RUE: Appears to be 4-/5 shoulder abduction, elbow flexion/extension,  0/5 finger grip RLE: Appears to be 2/5 proximal to distal  Skin: Skin is warm and dry.  Psychiatric: Her affect is blunt. Her speech is slurred. She expresses impulsivity.    Assessment/Plan: 1. Functional deficits secondary to left MCA infarct which require 3+ hours per day of interdisciplinary therapy in a comprehensive inpatient rehab setting. Physiatrist is providing close team supervision and 24 hour management of active medical problems listed below. Physiatrist and rehab team continue to assess barriers to discharge/monitor patient progress toward functional and medical goals.  Function:  Bathing Bathing position      Bathing parts      Bathing assist        Upper Body Dressing/Undressing Upper body dressing                    Upper body assist        Lower Body Dressing/Undressing Lower body dressing                                  Lower body assist        Toileting Toileting   Toileting steps completed by patient: Adjust clothing prior to toileting, Performs perineal hygiene, Adjust clothing after toileting (wore a gown)   Toileting Assistive Devices: Grab bar or rail  Toileting assist Assist level: Touching or steadying assistance (Pt.75%)   Transfers Chair/bed transfer  Locomotion Ambulation           Wheelchair          Cognition Comprehension Comprehension assist level: Follows basic conversation/direction with no assist  Expression Expression assist level: Expresses basic 25 - 49% of the time/requires cueing 50 - 75% of the time. Uses single words/gestures.  Social Interaction Social Interaction assist level: Interacts appropriately 75 - 89% of the time - Needs redirection for appropriate language or to initiate interaction.  Problem Solving Problem solving assist level: Solves basic 50 - 74% of the time/requires cueing 25 - 49% of the time  Memory Memory assist level: Recognizes or recalls 75 - 89% of the  time/requires cueing 10 - 24% of the time    Medical Problem List and Plan: 1. 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  Begin CIR 2. H/o DVT/PE/Anticoagulation: Pharmaceutical: Xarelto 3. Chronic back pain/Pain Management: Resumed oxycodone. Need to use judiciously to avoid sedation/respiratory suppression.  4. Mood: Monitor for mood for now--patient lacks insight and awareness or deficits. LCSW to follow along and complete evaluation when appropriate.  5. Neuropsych: This patient is not capable of making decisions on her own behalf. 6. Skin/Wound Care: Routine pressure relief measures 7. Fluids/Electrolytes/Nutrition: Monitor I/O. Offer supplements between meals.   Hypokalemia: Will replace today 8. HTN: Monitor BP bid. Lasix changed to HCTZ by primary team. 9. Asthma/COPD: Encourage IS as able. Avoid narcotic and may need to schedule nebs to help manage wheezing/hypoxia.  10. Morbid obesity: Pressure relief measures. Heart Healthy diet.  11. Mild OSA: Question OHS--may have CPAP at home--unknown as patient has been living with daughter for past 3-4 months, will follow up.  12. Depression/ Anxiety disorder: Used Celexa and ativan at home. Resume Celexa.  13. Hypoalbuminemia: Will start replacing today  LOS (Days) 1 A FACE TO FACE EVALUATION WAS PERFORMED  Makyla Bye Lorie Phenix 05/31/2015 9:09 AM

## 2015-05-31 NOTE — Progress Notes (Signed)
Patient information reviewed and entered into eRehab system by Shavontae Gibeault, RN, CRRN, PPS Coordinator.  Information including medical coding and functional independence measure will be reviewed and updated through discharge.     Per nursing patient was given "Data Collection Information Summary for Patients in Inpatient Rehabilitation Facilities with attached "Privacy Act Statement-Health Care Records" upon admission.  

## 2015-06-01 ENCOUNTER — Inpatient Hospital Stay (HOSPITAL_COMMUNITY): Payer: Medicare HMO | Admitting: Speech Pathology

## 2015-06-01 ENCOUNTER — Inpatient Hospital Stay (HOSPITAL_COMMUNITY): Payer: Medicare HMO | Admitting: Occupational Therapy

## 2015-06-01 ENCOUNTER — Inpatient Hospital Stay (HOSPITAL_COMMUNITY): Payer: Medicare HMO | Admitting: Physical Therapy

## 2015-06-01 DIAGNOSIS — R4701 Aphasia: Secondary | ICD-10-CM | POA: Insufficient documentation

## 2015-06-01 MED ORDER — GABAPENTIN 300 MG PO CAPS
300.0000 mg | ORAL_CAPSULE | Freq: Two times a day (BID) | ORAL | Status: DC
  Administered 2015-06-01 – 2015-06-08 (×14): 300 mg via ORAL
  Filled 2015-06-01 (×14): qty 1

## 2015-06-01 NOTE — Progress Notes (Signed)
[place] PHYSICAL MEDICINE & REHABILITATION     PROGRESS NOTE    Subjective/Complaints:  Patient seen this morning sitting up in bed eating breakfast. She has difficulty expressing what her issues, but does note back pain. Her receptive aphasia has significantly improved.  ROS: + Back pain. Denies CP, SOB, n/v/d.  Objective: Vital Signs: Blood pressure 105/66, pulse 83, temperature 97.9 F (36.6 C), temperature source Oral, resp. rate 18, last menstrual period 05/24/2003, SpO2 96 %. No results found.  Recent Labs  05/30/15 0443 05/31/15 0428  WBC 5.6 5.4  HGB 13.7 13.4  HCT 41.6 41.2  PLT 160 165    Recent Labs  05/30/15 0443 05/31/15 0428  NA 138 137  K 3.5 3.2*  CL 103 101  GLUCOSE 92 94  BUN <5* 6  CREATININE 0.65 0.73  CALCIUM 9.0 8.7*   CBG (last 3)  No results for input(s): GLUCAP in the last 72 hours.  Wt Readings from Last 3 Encounters:  05/26/15 108.6 kg (239 lb 6.7 oz)  04/22/15 110.814 kg (244 lb 4.8 oz)  11/21/14 104.781 kg (231 lb)    Physical Exam:  BP 105/66 mmHg  Pulse 83  Temp(Src) 97.9 F (36.6 C) (Oral)  Resp 18  SpO2 96%  LMP 05/24/2003 Constitutional: She appears well-developed and well-nourished. Vital signs reviewed.  Obese female.  HENT: Normocephalic and atraumatic. Eyes: Conjunctivae and EOM are normal.  Neck: Normal range of motion. Neck supple.  Cardiovascular: Normal rate and regular rhythm.  Respiratory: Effort normal. No respiratory distress.  GI: Soft. Bowel sounds are normal. She exhibits no distension. There is no tenderness.  Musculoskeletal: She exhibits no edema or tenderness.  Neurological: She is alert.  Transcortical Fluent aphasia with severe dysarthria and apraxia.  Has right inattention with right body neglect.  Impulsive and lacks insight/awareness of deficits.  Sensation appears to be diminished RLE Motor: LUE/LLE: Appears to be 4/5 proximal to distal RUE: Appears to be 4-/5 shoulder abduction,  elbow flexion/extension, 0/5 finger grip RLE: Appears to be 2+/5 proximal to distal  Skin: Skin is warm and dry.  Psychiatric: Her speech is slurred. She expresses impulsivity.   Assessment/Plan: 1. Functional deficits secondary to left MCA infarct which require 3+ hours per day of interdisciplinary therapy in a comprehensive inpatient rehab setting. Physiatrist is providing close team supervision and 24 hour management of active medical problems listed below. Physiatrist and rehab team continue to assess barriers to discharge/monitor patient progress toward functional and medical goals.  Function:  Bathing Bathing position   Position: Shower  Bathing parts Body parts bathed by patient: Right arm, Chest, Abdomen, Front perineal area, Right upper leg, Left upper leg Body parts bathed by helper: Back, Left lower leg, Right lower leg, Left arm, Buttocks  Bathing assist Assist Level: Touching or steadying assistance(Pt > 75%)      Upper Body Dressing/Undressing Upper body dressing   What is the patient wearing?: Pull over shirt/dress     Pull over shirt/dress - Perfomed by patient: Thread/unthread left sleeve Pull over shirt/dress - Perfomed by helper: Thread/unthread right sleeve, Put head through opening, Pull shirt over trunk        Upper body assist Assist Level: Touching or steadying assistance(Pt > 75%)      Lower Body Dressing/Undressing Lower body dressing   What is the patient wearing?: Pants, Non-skid slipper socks     Pants- Performed by patient: Thread/unthread left pants leg Pants- Performed by helper: Thread/unthread right pants leg, Pull pants  up/down Non-skid slipper socks- Performed by patient: Don/doff right sock, Don/doff left sock                    Lower body assist Assist for lower body dressing: Touching or steadying assistance (Pt > 75%)      Toileting Toileting   Toileting steps completed by patient: Performs perineal hygiene, Adjust  clothing prior to toileting Toileting steps completed by helper: Adjust clothing after toileting Toileting Assistive Devices: Grab bar or rail  Toileting assist Assist level: Touching or steadying assistance (Pt.75%)   Transfers Chair/bed transfer   Chair/bed transfer method: Ambulatory Chair/bed transfer assist level: Touching or steadying assistance (Pt > 75%)       Locomotion Ambulation     Max distance: 50 Assist level: Touching or steadying assistance (Pt > 75%)   Wheelchair Wheelchair activity did not occur: Refused     Assist Level: Dependent (Pt equals 0%)  Cognition Comprehension Comprehension assist level: Understands basic 90% of the time/cues < 10% of the time  Expression Expression assist level: Expresses basic 50 - 74% of the time/requires cueing 25 - 49% of the time. Needs to repeat parts of sentences.  Social Interaction Social Interaction assist level: Interacts appropriately 75 - 89% of the time - Needs redirection for appropriate language or to initiate interaction.  Problem Solving Problem solving assist level: Solves basic 25 - 49% of the time - needs direction more than half the time to initiate, plan or complete simple activities  Memory Memory assist level: Recognizes or recalls 25 - 49% of the time/requires cueing 50 - 75% of the time    Medical Problem List and Plan: 1. 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  Continue CIR 2. H/o DVT/PE/Anticoagulation: Pharmaceutical: Xarelto 3. Chronic back pain/Pain Management: Resumed oxycodone. Need to use judiciously to avoid sedation/respiratory suppression.   Will increase gabapentin to twice a day 4. Mood: Monitor for mood for now--patient lacks insight and awareness or deficits. LCSW to follow along and complete evaluation when appropriate.  5. Neuropsych: This patient is not capable of making decisions on her own behalf. 6. Skin/Wound Care: Routine pressure relief  measures 7. Fluids/Electrolytes/Nutrition: Monitor I/O. Offer supplements between meals.   Hypokalemia: Replaced on 12/22 8. HTN: Monitor BP bid. Lasix changed to HCTZ by primary team. 9. Asthma/COPD: Encourage IS as able. Avoid narcotic and may need to schedule nebs to help manage wheezing/hypoxia.  10. Morbid obesity: Pressure relief measures. Heart Healthy diet.  11. Mild OSA: Question OHS--may have CPAP at home--unknown as patient has been living with daughter for past 3-4 months, will follow up.  12. Depression/ Anxiety disorder: Used Celexa and ativan at home. Resume Celexa.  13. Hypoalbuminemia: Started supplementation on 12/22  LOS (Days) 2 A FACE TO FACE EVALUATION WAS PERFORMED  Missael Ferrari Lorie Phenix 06/01/2015 10:51 AM

## 2015-06-01 NOTE — Progress Notes (Signed)
Physical Therapy Note  Patient Details  [name] MRN: [id number] Date of Birth: 1955/09/24 Today's Date: 06/01/2015    Time: 828-923 55 minutes  1:1 Pt does not c/o pain, pt states "I just don't feel good" but is unable to verbalize what does not feel well.  Pt agreeable to treatment but requires frequent rest breaks due to not feeling well.  Pt gait with close supervision throughout unit with wide BOS, increased trunk lean bilat, no LOB in controlled environment. Gait throughout obstacles, stepping over obstacles and up/down curb step with min A for up/down curb, close supervision for obstacles.  Gait on uneven surface with min A.  Pt tends to grab for furniture or items to hold on to when fatigued during gait but is able to gait without a device with supervision. Pt states need to use restroom and is able to perform hygiene and clothing management as well as transfers with supervision.  Nu step for activity tolerance x 6 minutes level 2.  Pt then very anxious about daughter coming to visit. Pt taken back to room and PT assisted pt with using phone to call her daughter.  DONAWERTH,KAREN 06/01/2015, 9:24 AM

## 2015-06-01 NOTE — IPOC Note (Signed)
Overall Plan of Care [facility] Patient Details [name] MRN: [id number] DOB: 1956/06/05  Admitting Diagnosis: [facility] Problems: Active Problems:   Morbid obesity ([place])   Hypertension   Dyslipidemia   Esophageal reflux   Tobacco abuse   Anxiety   Depression   Chronic pain syndrome   Hyperglycemia   Constipation   Obesity, Class III, BMI 40-49.9 (morbid obesity) (HCC)   GERD (gastroesophageal reflux disease)   Obstructive sleep apnea   Aphasia   Weakness   Elevated troponin   Essential hypertension   Global aphasia   Hx of migraines   OSA (obstructive sleep apnea)   Hemiparesis, aphasia, and dysphagia as late effect of cerebrovascular accident (CVA) ([place])   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 ([place])   Expressive aphasia     Functional Problem List: Nursing Endurance, Medication Management, Nutrition, Pain, Safety  PT Balance, Behavior, Endurance, Motor, Safety  OT Balance, Behavior, Cognition, Endurance, Motor, Nutrition, Pain, Perception, Safety, Sensory, Skin Integrity, Vision  SLP Cognition, Linguistic, Motor, Nutrition, Safety  TR         Basic ADL's: OT Eating, Grooming, Bathing, Dressing, Toileting     Advanced  ADL's: OT       Transfers: PT Bed Mobility, Bed to Chair, Car, Manufacturing systems engineer, Metallurgist: PT Stairs     Additional Impairments: OT Fuctional Use of Upper Extremity  SLP Swallowing, Communication, Social Cognition comprehension, expression Attention, Awareness  TR      Anticipated Outcomes Item Anticipated Outcome  Self Feeding supervision  Swallowing  Min assist    Basic self-care  supervision  Toileting  supervision   Bathroom Transfers supervision  Bowel/Bladder  manage bowel and bladder mod I  Transfers  modified independent  Locomotion  supervision gait x 150' , and up/down 12 steps 2 rails and 1 step no rail  Communication  Mod Assist   Cognition  Mod assist    Pain  4 or less  Safety/Judgment  min assist   Therapy Plan: PT Intensity: Minimum of 1-2 x/day ,45 to 90 minutes PT Frequency: 5 out of 7 days PT Duration Estimated Length of Stay: 10-12 OT Intensity: Minimum of 1-2 x/day, 45 to 90 minutes OT Frequency: 5 out of 7 days OT Duration/Estimated Length of Stay: 10-12 days SLP Intensity: Minumum of 1-2 x/day, 30 to 90 minutes SLP Frequency: 3 to 5 out of 7 days SLP Duration/Estimated Length of Stay: 10-12 days        Team Interventions: Nursing Interventions Patient/Family Education, Disease Management/Prevention, Pain Management, Medication Management, Cognitive Remediation/Compensation, Dysphagia/Aspiration Precaution Training, Discharge Planning  PT interventions Ambulation/gait training, Training and development officer, Community reintegration, Cognitive remediation/compensation, Discharge planning, DME/adaptive equipment instruction, Functional mobility training, Neuromuscular re-education, Patient/family education, Psychosocial support, Stair training, Splinting/orthotics, Therapeutic Activities, Therapeutic Exercise, UE/LE Strength taining/ROM, UE/LE Coordination activities, Wheelchair propulsion/positioning  OT Interventions Training and development officer, Cognitive remediation/compensation, Academic librarian, Discharge planning, DME/adaptive equipment instruction, Disease mangement/prevention, Functional mobility training, Pain management, Neuromuscular re-education, Patient/family education, Self Care/advanced ADL retraining, Psychosocial support, Skin care/wound managment, Therapeutic Activities, UE/LE Strength taining/ROM, Therapeutic Exercise, UE/LE Coordination activities, Visual/perceptual remediation/compensation, Wheelchair propulsion/positioning  SLP Interventions Cognitive remediation/compensation, Cueing hierarchy, Dysphagia/aspiration precaution training, Environmental controls, Functional tasks, Multimodal communication  approach, Internal/external aids, Patient/family education  TR Interventions    SW/CM Interventions Discharge Planning, Psychosocial Support, Patient/Family Education    Team Discharge Planning: Destination: PT-Home (dtr's) ,OT- Home , SLP-Home Projected Follow-up: PT-Home health PT, OT-  Outpatient OT, SLP-Home Health SLP, Outpatient SLP, 24 hour supervision/assistance Projected Equipment Needs: PT-To be determined, OT- To be determined, SLP-None recommended by SLP Equipment Details: PT-owns a SPC but rarely used it, OT-  Patient/family involved in discharge planning: PT- Family member/caregiver,  OT-Patient, SLP-Patient, Family member/caregiver  MD ELOS: 9-12 days. Medical Rehab Prognosis:  Good Assessment: 59 y.o. female with history of HTN, COPD, Colon CA, Migraines, OSA, morbid obesity, DVT/PE on coumadin who was admitted on 05/26/15 after being found by family with confusion, lethargy and difficulty speaking on 05/26/15. CT brain with question of early ischemia in L-MCA distribution and INR- 1.61. CTA with confirmed large area of ischemia involving posterior left insular ribbon, superior left temporal lobe, and left parietal white. MRI/MRA brain revealed large L-MCA territory infarct with moderate to severe focal stenosis within more lateral L-M2 branch. She developed chest pain with elevated troponin and cardiology consulted for input. Dr. Mare Ferrari felt elevated troponin could bed due to stroke v/s ACS and CP appeared musculoskeletal in nature per exam-doubt PE. Patient not a candidate for cardiac cath and to consider switching to NOAC as patient on lifelong anticoagulation. MBS done to evaluate dysphagia and patient started on dysphagia 1, thin liquids due to difficulty handling oral bolus.  Patient with resultant right inattention, right sided weakness/apraxia, global aphasia affecting ability to follow commands, swallowing, insight into deficits as well as safety with mobility.  Pt's  receptive aphasia has significantly improved, but continues to have expressive aphasia.  See Team Conference Notes for weekly updates to the plan of care

## 2015-06-01 NOTE — Progress Notes (Signed)
Social Work Assessment and Plan  Patient Details  [name] MRN: [id number] Date of Birth: [birth year]  Today's Date: 06/01/2015  Problem List:  Patient Active Problem List   Diagnosis Date Noted  . Expressive aphasia   . 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 ([place]) 05/30/2015  . Stroke with cerebral ischemia ([place])   . Chronic obstructive pulmonary disease ([place])   . Hx of migraines   . OSA (obstructive sleep apnea)   . Hemiparesis, aphasia, and dysphagia as late effect of cerebrovascular accident (CVA) ([place])   . Elevated troponin 05/28/2015  . Essential hypertension 05/28/2015  . Hyperkalemia 05/28/2015  . History of pulmonary embolism 05/28/2015  . History of DVT (deep vein thrombosis) 05/28/2015  . Global aphasia   . Aphasia   . Weakness   . Stroke ([place]) 05/26/2015  . Acute ischemic stroke ([place]) 05/26/2015  . Altered mental status 04/21/2015  . Acute tonsillitis 06/25/2014  . Acute pulmonary embolism ([place]) 02/24/2014  . Acute deep vein thrombosis (DVT) of other specified vein of right lower extremity ([place]) 02/24/2014  . Respiratory failure ([place]) 02/23/2014  . DVT (deep venous thrombosis) ([place]) 02/16/2014  . DVT femoral (deep venous thrombosis) with thrombophlebitis ([place]) 02/16/2014  . Asthma, chronic 02/08/2014  . Obstructive sleep apnea 02/08/2014  . GERD (gastroesophageal reflux disease) 09/28/2013  . Chest pain 04/19/2013  . Obesity, Class III, BMI 40-49.9 (morbid obesity) ([place]) 08/23/2012  . Constipation 08/20/2012  . Headache(784.0) 08/20/2012  . Chronic pain syndrome 08/19/2012  . Hyperglycemia 08/19/2012  . Insomnia 08/19/2012  . Hypokalemia 10/21/2011  . Tobacco abuse 10/21/2011  . Arthritis 10/21/2011  . Anxiety 10/21/2011  . Depression 10/21/2011  . Esophageal reflux 07/30/2011  . Diarrhea following gastrointestinal surgery 07/30/2011  . PVC (premature ventricular contraction) 07/10/2011  . Morbid obesity ([place])   .  Hypertension   . Dyslipidemia   . 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 Hypertension   . Asthma   . COPD (chronic obstructive pulmonary disease) ([place])   . Anxiety   . Colon cancer ([place])     a. s/p surgery, chemotherapy.  . Dyslipidemia   . Migraine headache   . Chronic bronchitis   . Uterine fibroid   . Ovarian cyst   . GERD (gastroesophageal reflux disease)   . Morbid obesity ([place])   . Obstructive sleep apnea     mild  . Chronic pain   . Chronic back pain   . Arthritis   . Osteoarthritis   . Depression   . DVT (deep venous thrombosis) ([place]) 02/2014  . Bilateral pulmonary embolism ([place]) 02/2014    a. PCCM recommended lifelong anticoagulation.  . Tobacco abuse   . PVC's (premature ventricular contractions) 2013  . Aortic stenosis     a. mild by echo 05/2015.   Past Surgical History:  Past Surgical History  Procedure Laterality Date  . Knee arthroscopy      bil.  . Carpal tunnel release      rt  . Mouth surgery      teeth extraction  . Cesarean section    . Subtotal colectomy    . Colonoscopy    . Tubal ligation     Social History:  reports that she has been smoking Cigarettes.  She has a 37 pack-year smoking history. She has never used smokeless tobacco. She reports that she  does not drink alcohol or use illicit drugs.  Family / Support Systems Marital Status: Widow/Widower How Long?: a while Patient Roles: Partner (has a dtr, son, and grandchildren) Children: Brooke [name] - dtr - [phone] Other Supports: Brooke [name] - granddtr [phone] Anticipated Caregiver: Daughter and granddaughter (33 yr old) and other family members Ability/Limitations of Caregiver: Daughter works 3rd shift.  Granddaughter goes to school. Caregiver Availability: 24/7 (Dtr stated that pt will never be home alone.) Family Dynamics: supportive  family  Social History Preferred language: English Religion: Christian Education: 10th grade Read: Yes Write: Yes Employment Status: Disabled Public relations account executive Issues: none reported Guardian/Conservator: Currently MD does not feel pt can make her own decisions, so her dtr is assisting her with medical decisions.   Abuse/Neglect Physical Abuse: Denies Verbal Abuse: Denies Sexual Abuse: Denies Exploitation of patient/patient's resources: Denies Self-Neglect: Denies  Emotional Status Pt's affect, behavior and adjustment status: Pt is frustrated at times that she cannot express her thoughts accurately.  Otherwise, she is participating in therapies. Recent Psychosocial Issues: none reported Psychiatric History: Pt with a history of depression and anxiety and was taking Celexa and Ativan at home PTA. Substance Abuse History: none reported  Patient / Family Perceptions, Expectations & Goals Pt/Family understanding of illness & functional limitations: Pt's dtr feels she has a good understanding of pt's condition and that her questions have been answered thus far.  She will let CSW know if she needs any further information as needs arise. Premorbid pt/family roles/activities: Pt enjoyed her family and was picking grandchildren up from school regularly. Anticipated changes in roles/activities/participation: Pt will not be driving, family realizes.  Pt hopes to resume spending time with family soon. Pt/family expectations/goals: Family wants pt to progress and be able to go home.  Community Resources Express Scripts: None Premorbid Home Care/DME Agencies: None Transportation available at discharge: dtr Resource referrals recommended: Neuropsychology, Support group (specify)  Discharge Planning Living Arrangements: Children Support Systems: Children, Other relatives, Friends/neighbors Type of Residence: Private residence Insurance Resources: [employer] ([place]  Medicare) Museum/gallery curator Resources: SSD Financial Screen Referred: No Living Expenses: Rent Money Management: Patient Does the patient have any problems obtaining your medications?: No Home Management: Pt was doing this PTA.  She will be going to her dtr's home at d/c and dtr and her children can manage the home. Patient/Family Preliminary Plans: Pt was at dtr's home for the past three months and she plans to return there at d/c.  Dtr stated that she will have someone with pt at home at all times. Barriers to Discharge: Steps Social Work Anticipated Follow Up Needs: HH/OP, Support Group Expected length of stay: 10 to 12 days  Clinical Impression CSW met pt to introduce self and role of CSW, as well as to complete assessment.  Pt had a difficult time expressing herself due to the aphasia, but tried very hard with CSW.  CSW later visited pt again while her dtr, granddtr, and other visitor were present.  All three women stated that pt would not be left alone at all when she is d/c'd home to dtr's home. Pt was less attentive to conversation when more people were present than she was when it was just pt and CSW.  Dtr works at night and so family will have to make arrangements for pt when she is at work or sleeping, but they assured CSW they would take care of that.  Dtr has been off this week and is off next week, so luckily she  can spend more time with pt.  It would be helpful to do family education toward the end of next week while dtr is still off from work.  CSW will continue to follow pt and assist pt/family with d/c planning.  Aldean Pipe, Silvestre Mesi 06/01/2015, 3:13 PM

## 2015-06-01 NOTE — Progress Notes (Signed)
Speech Language Pathology Daily Session Note  Patient Details  Name: ARMANDA VIBERT MRN: [id number] Date of Birth: Mar 27, 1956  Today's Date: 06/01/2015 SLP Individual Time: 1445-1520 SLP Individual Time Calculation (min): 35 min  Short Term Goals: Week 1: SLP Short Term Goal 1 (Week 1): Pt will be intelligible at the word level in 75% of opportunities with max assist verbal cues.  SLP Short Term Goal 2 (Week 1): Pt will follow 2 step commands during basic familiar tasks for 75% accuracy with max assist tactile cues.  SLP Short Term Goal 3 (Week 1): Pt will answer semi-complex yes/no questions for 75% accuracy with mod assist visual cues.   SLP Short Term Goal 4 (Week 1): Pt will name basic, familiar objects for 80% accuracy with mod assist verbal cues.  SLP Short Term Goal 5 (Week 1): Pt will sustain attention to a basic, familiar task for 2 minutes with mod assist verbal cues for redirection to task.   SLP Short Term Goal 6 (Week 1): Pt will consume trials of dys 2 textures with mod assist verbal cues to monitor and correct oral phase deficits over 3 targeted sessions.   Skilled Therapeutic Interventions:  Pt was seen for skilled ST targeting communication and dysphagia.  SLP facilitated the session with a calendar task targeting verbal expression during automatic sequences.  Pt alternated between naming days of the week and counting from 1 to 31 (days on the calendar) with max assist verbal and visual cues to recognize and correct perseverative verbal errors.  Pt was also able to improve her intelligibility to ~50% at the word level with mod assist verbal cues for overarticulation.  SLP further facilitated the session with trials of dys 2 textures to continue working towards diet progression.  Pt consumed trials with min assist verbal cues to monitor and correct anterior labial loss of boluses.  Pt demonstrated fast rate of self feeding but did not exhibit overt s/s of aspiration and was able to  clear solids from the oral cavity post swallow with min verbal cues for use of liquid wash.  SLP will continue to follow up to assess readiness for diet advancement.  Pt returned to room and left with cousin, daughter, and granddaughter at bedside.  Continue per current plan of care.    Function:  Eating Eating   Modified Consistency Diet: Yes Eating Assist Level: Set up assist for;Supervision or verbal cues;Helper checks for pocketed food   Eating Set Up Assist For: Opening containers       Cognition Comprehension Comprehension assist level: Understands basic 75 - 89% of the time/ requires cueing 10 - 24% of the time  Expression   Expression assist level: Expresses basic 25 - 49% of the time/requires cueing 50 - 75% of the time. Uses single words/gestures.  Social Interaction Social Interaction assist level: Interacts appropriately 75 - 89% of the time - Needs redirection for appropriate language or to initiate interaction.  Problem Solving Problem solving assist level: Solves basic 25 - 49% of the time - needs direction more than half the time to initiate, plan or complete simple activities  Memory Memory assist level: Recognizes or recalls 25 - 49% of the time/requires cueing 50 - 75% of the time    Pain Pain Assessment Pain Assessment: No/denies pain  Therapy/Group: Individual Therapy  Shama Monfils, Selinda Orion 06/01/2015, 4:34 PM

## 2015-06-01 NOTE — Progress Notes (Signed)
Inpatient Rehabilitation Center Individual Statement of Services  Patient Name:  Brooke [name]  Date:  06/01/2015  Welcome to the [facility].  Our goal is to provide you with an individualized program based on your diagnosis and situation, designed to meet your specific needs.  With this comprehensive rehabilitation program, you will be expected to participate in at least 3 hours of rehabilitation therapies Monday-Friday, with modified therapy programming on the weekends.  Your rehabilitation program will include the following services:  Physical Therapy (PT), Occupational Therapy (OT), Speech Therapy (ST), 24 hour per day rehabilitation nursing, Neuropsychology, Case Management (Social Worker), Rehabilitation Medicine, Nutrition Services and Pharmacy Services  Weekly team conferences will be held on Wednesdays to discuss your progress.  Your Social Worker will talk with you frequently to get your input and to update you on team discussions.  Team conferences with you and your family in attendance may also be held.  Expected length of stay:  10 to 12 days  Overall anticipated outcome:  Supervision  Depending on your progress and recovery, your program may change. Your Social Worker will coordinate services and will keep you informed of any changes. Your Social Worker's name and contact numbers are listed  below.  The following services may also be recommended but are not provided by the [facility] will be made to provide these services after discharge if needed.  Arrangements include referral to agencies that provide these services.  Your insurance has been verified to be:  [employer] Your primary doctor is:  Dr. Lorayne Marek  Pertinent information will be shared with your doctor and your insurance company.  Social Worker:   Alfonse Alpers, LCSW  [phone] or ([phone]  Information discussed with and copy given to patient by: Trey Sailors, 06/01/2015, 2:55 PM

## 2015-06-01 NOTE — Progress Notes (Signed)
Occupational Therapy Session Note  Patient Details  [name] MRN: [id number] Date of Birth: 01/09/1956  Today's Date: 06/01/2015 OT Individual Time:  -   1100-1215  (75 min)   1st session                                            1330-1415   (45 min)  2nd session  Short Term Goals: Week 1:  OT Short Term Goal 1 (Week 1): Pt will perform toileting 3/3 steps with steadying A OT Short Term Goal 2 (Week 1): Pt will initiate using her right hand in ADL performance with mod cuing in a 60 min session  OT Short Term Goal 3 (Week 1): Pt will utilize call bell to notifiy  toileting needs with mod A in a 60 min session       Skilled Therapeutic Interventions/Progress Updates:      1st session:  Pt. Lying in bed upon OT arrival.  Agreed to shower.  Ambulated to shower.  Addressed LUE NMRE, balance in sitting and stranding.  Pt. Ambulated to shower and sat to shower with min assist for balance when reaching to floor and standing.  Pt.ambulated to wc and completed dressing.  Pt complained of Right shoulder pain with all the transfers and movement.  Ppt returned to bed and RN administered meds and OT provided heat pack 2nd session:    2nd session:  Pain:  none Individual session:  Pt engaged in EOB activities using RUE.  Daughter, grand daughter, and friend present.  Pt. Needed encouragement to participate, but family supportive and offered encouragement as well.  Pt engaged in handwriting activities, tic tac toe, and writing name.  Use transpore tape to thumb, index and middle to stabilize pencil as well as built up grip.  Pt could benefit from red foam to practice writng next time.  Pt left sitting EOB with family present with bed alarm on.      Therapy Documentation Precautions:  Precautions Precautions: Fall Precaution Comments: R sided neglect Restrictions Weight Bearing Restrictions: No    Pain:  6/10 at end of session 1st     See Function Navigator for Current Functional  Status.   Therapy/Group: Individual Therapy  Lisa Roca 06/01/2015, 12:47 PM

## 2015-06-02 ENCOUNTER — Inpatient Hospital Stay (HOSPITAL_COMMUNITY): Payer: Medicare HMO | Admitting: Physical Therapy

## 2015-06-02 ENCOUNTER — Inpatient Hospital Stay (HOSPITAL_COMMUNITY): Payer: Medicare HMO | Admitting: Occupational Therapy

## 2015-06-02 ENCOUNTER — Inpatient Hospital Stay (HOSPITAL_COMMUNITY): Payer: Medicare HMO | Admitting: Speech Pathology

## 2015-06-02 DIAGNOSIS — E785 Hyperlipidemia, unspecified: Secondary | ICD-10-CM

## 2015-06-02 MED ORDER — SODIUM CHLORIDE 0.9 % IJ SOLN
10.0000 mL | INTRAMUSCULAR | Status: DC | PRN
  Administered 2015-06-03: 20 mL
  Administered 2015-06-07: 10 mL
  Filled 2015-06-02 (×2): qty 40

## 2015-06-02 NOTE — Progress Notes (Addendum)
Physical Therapy Session Note  Patient Details  [name] MRN: [id number] Date of Birth: Jan 03, 1956  Today's Date: 06/02/2015 PT Group Time: 0900-1030 PT Group Time Calculation (min): 90 min  Short Term Goals: Week 1:  PT Short Term Goal 1 (Week 1): = LTGs due ELOS  Therapy Documentation Precautions:  Precautions Precautions: Fall Precaution Comments: R sided neglect Restrictions Weight Bearing Restrictions: No  Patient participated in group therapy session with emphasis placed on endurance, functional mobility and dynamic balance.   Transfers: Sit to and from stand transfer with close supervision; verbal cues even weight distribution and controlled descent.    Ambulation: Patient ambulated 175 feetx3without assistive device close supervision. Patient ambulated with a step through gait pattern. Verbal cues for increased step length on right. Patient demonstrates uneven step and stride length with a varying cadence. Patient limited by fatigue.  Seated there ex: B Long arc quads 3x10 B ankle pumps 3x10 B hip flexion 3x10  NU step workload 5: 10 minutes utilizing both UE and LE endurance and strengthening. Patient required maximal verbal cues for attention to RUE grip.     Stair negotiation: Patient negotiated 12 steps with left handrail and min assist. Patient performed stairs with a step to pattern. Patient educated on proper sequence and technique and was able to return demonstration with minimal verbal cues  Standing ther ex: Heel raises 30x close supervision with occasional min assist with LOB posteriorly Mini squats 30x.  Cone taps 2 sets alternating for 3 minutes between left and right lower extremity with minimal to moderate excursions. Close supervision with two LOB to the right requiring min assist to recover.   Performed item retrieval from floor and able to set cone cone properly supervision and verbal cues for proper technique.   Patient performed  backwards walking 25 feetx2 in order to promote increased hip extension.   Patient tolerated treatment well. Vitals monitored and remained stable throughout session responding appropriately to activity. Patient was without pain during session. Patient tolerated session with rest breaks throughout. Patient returned to room at end of session with all needs met resting comfortably in chair at bedside.  Call bell within reach and patient educated not to be up without assistance. Patient verbalized understanding. Quick release belt engaged upon exiting the room.   See Function Navigator for Current Functional Status.   Therapy/Group: Group Therapy  Retta Diones 06/02/2015, 12:11 PM

## 2015-06-02 NOTE — Progress Notes (Signed)
Speech Language Pathology Daily Session Note  Patient Details  [name] MRN: [id number] Date of Birth: 1955/09/26  Today's Date: 06/02/2015 SLP Individual Time: [id number] SLP Individual Time Calculation (min): 45 min  Short Term Goals: Week 1: SLP Short Term Goal 1 (Week 1): Pt will be intelligible at the word level in 75% of opportunities with max assist verbal cues.  SLP Short Term Goal 2 (Week 1): Pt will follow 2 step commands during basic familiar tasks for 75% accuracy with max assist tactile cues.  SLP Short Term Goal 3 (Week 1): Pt will answer semi-complex yes/no questions for 75% accuracy with mod assist visual cues.   SLP Short Term Goal 4 (Week 1): Pt will name basic, familiar objects for 80% accuracy with mod assist verbal cues.  SLP Short Term Goal 5 (Week 1): Pt will sustain attention to a basic, familiar task for 2 minutes with mod assist verbal cues for redirection to task.   SLP Short Term Goal 6 (Week 1): Pt will consume trials of dys 2 textures with mod assist verbal cues to monitor and correct oral phase deficits over 3 targeted sessions.   Skilled Therapeutic Interventions:  Pt was seen for skilled ST targeting cognitive goals.  Upon arrival, pt presented with complaints of "not feeling good" but could not specify why due to aphasia.  Vital signs taken and were [facility].  RN made aware.  Pt agreeable to participate in therapies.  SLP facilitated the session with a basic sorting task targeting sustained attention.  Pt sorted picture cards into groups of 4 with overall min assist verbal cues for redirection to task.  Pt ambulated back to room with handheld assist from SLP for safety and min assist verbal cues for route recall.  Pt was left in bed with bed alarm activated and call bell left within reach.  Continue per current plan of care.   Function:  Eating Eating              Cognition Comprehension Comprehension assist level: Understands basic 75 - 89% of  the time/ requires cueing 10 - 24% of the time  Expression   Expression assist level: Expresses basic 50 - 74% of the time/requires cueing 25 - 49% of the time. Needs to repeat parts of sentences.  Social Interaction Social Interaction assist level: Interacts appropriately 75 - 89% of the time - Needs redirection for appropriate language or to initiate interaction.  Problem Solving Problem solving assist level: Solves basic 25 - 49% of the time - needs direction more than half the time to initiate, plan or complete simple activities  Memory Memory assist level: Recognizes or recalls 25 - 49% of the time/requires cueing 50 - 75% of the time    Pain Pain Assessment Pain Assessment: No/denies pain  Therapy/Group: Individual Therapy  Akylah Hascall, Selinda Orion 06/02/2015, 3:01 PM

## 2015-06-02 NOTE — Progress Notes (Signed)
Brooke [name] is a 59 y.o. female 1955-12-21 [id number]  Subjective: No new complaints. No new problems. Slept well. Feeling OK.  Objective: Vital signs in last 24 hours: Temp:  [97.8 F (36.6 C)-98.6 F (37 C)] 97.8 F (36.6 C) (12/24 0610) Pulse Rate:  [90-97] 97 (12/24 0610) Resp:  [18] 18 (12/24 0610) BP: (100-107)/(46-95) 100/46 mmHg (12/24 0610) SpO2:  [99 %] 99 % (12/24 0610) Weight change:  Last BM Date: 06/01/15  Intake/Output from previous day: 12/23 0701 - 12/24 0700 In: 240 [P.O.:240] Out: -  Last cbgs: CBG (last 3)  No results for input(s): GLUCAP in the last 72 hours.   Physical Exam General: No apparent distress   HEENT: not dry Lungs: Normal effort. Lungs clear to auscultation, no crackles or wheezes. Cardiovascular: Regular rate and rhythm, no edema Abdomen: S/NT/ND; BS(+) Musculoskeletal:  unchanged Neurological: No new neurological deficits Wounds: N/A    Skin: clear  Aging changes Mental state: Alert, aphasic/dysarthric, cooperative    Lab Results: BMET    Component Value Date/Time   NA 137 05/31/2015 0428   K 3.2* 05/31/2015 0428   CL 101 05/31/2015 0428   CO2 28 05/31/2015 0428   GLUCOSE 94 05/31/2015 0428   BUN 6 05/31/2015 0428   CREATININE 0.73 05/31/2015 0428   CREATININE 0.72 06/13/2014 1254   CALCIUM 8.7* 05/31/2015 0428   GFRNONAA >60 05/31/2015 0428   GFRNONAA >89 06/13/2014 1254   GFRAA >60 05/31/2015 0428   GFRAA >89 06/13/2014 1254   CBC    Component Value Date/Time   WBC 5.4 05/31/2015 0428   RBC 4.21 05/31/2015 0428   HGB 13.4 05/31/2015 0428   HCT 41.2 05/31/2015 0428   PLT 165 05/31/2015 0428   MCV 97.9 05/31/2015 0428   MCH 31.8 05/31/2015 0428   MCHC 32.5 05/31/2015 0428   RDW 13.6 05/31/2015 0428   LYMPHSABS 1.9 05/31/2015 0428   MONOABS 0.6 05/31/2015 0428   EOSABS 0.2 05/31/2015 0428   BASOSABS 0.0 05/31/2015 0428    Studies/Results: No results found.  Medications: I have reviewed the patient's  current medications.  Assessment/Plan:  1. 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 Continue CIR 2. H/o DVT/PE/Anticoagulation: Pharmaceutical: Xarelto 3. Chronic back pain/Pain Management: Resumed oxycodone. Need to use judiciously to avoid sedation/respiratory suppression.  Will increase gabapentin to twice a day 4. Mood: Monitor for mood for now--patient lacks insight and awareness or deficits. LCSW to follow along and complete evaluation when appropriate.  5. Neuropsych: This patient is not capable of making decisions on her own behalf. 6. Skin/Wound Care: Routine pressure relief measures 7. Fluids/Electrolytes/Nutrition: Monitor I/O. Offer supplements between meals.  Hypokalemia: Replaced on 12/22 8. HTN: Monitor BP bid. Lasix changed to HCTZ by primary team. 9. Asthma/COPD: Encourage IS as able. Avoid narcotic and may need to schedule nebs to help manage wheezing/hypoxia.  10. Morbid obesity: Pressure relief measures. Heart Healthy diet.  11. Mild OSA: Question OHS--may have CPAP at home--unknown as patient has been living with daughter for past 3-4 months, will follow up.  12. Depression/ Anxiety disorder: Used Celexa and ativan at home. Resume Celexa.     Length of stay, days: 3  Walker Kehr , MD 06/02/2015, 1:30 PM

## 2015-06-02 NOTE — Progress Notes (Signed)
Occupational Therapy Session Note  Patient Details  [name] MRN: [id number] Date of Birth: 12-Jan-1956  Today's Date: 06/02/2015 OT Individual Time:  -   0800-0900  (60 min)      Short Term Goals: Week 1:  OT Short Term Goal 1 (Week 1): Pt will perform toileting 3/3 steps with steadying A OT Short Term Goal 2 (Week 1): Pt will initiate using her right hand in ADL performance with mod cuing in a 60 min session  OT Short Term Goal 3 (Week 1): Pt will utilize call bell to notifiy  toileting needs with mod A in a 60 min session Week 2:     Skilled Therapeutic Interventions/Progress Updates:    Pt. lying in bed upon OT arrival.  Pt went from supine to sit to eat breakfast ,  Utilized RUE to eat breakfast for about 50 % of meal with encouragement.  Provided red foam grippers for increased grip on utensils.  Pt utilized both hands to hold cup of coffee. Pt ambulated to chest of drawers and retrieved clothes but did not want to bathe or dress.  She dicussed her family and the fact that her husband was deceased.  She  Had trouble enunciating her words but needed no cues to keep trying and talking.  Left pt on side of bed and passed on to the next therapy.  .     Therapy Documentation Precautions:   Precautions Precautions: Fall Precaution Comments: R sided neglect Restrictions Weight Bearing Restrictions: No General:   Vital Signs: Therapy Vitals Temp: 97.8 F (36.6 C) Temp Source: Oral Pulse Rate: 97 Resp: 18 BP: (!) 100/46 mmHg Patient Position (if appropriate): Lying Oxygen Therapy SpO2: 99 % O2 Device: Not Delivered Pain: Pain Assessment Pain Assessment: 0-10 Pain Score: 0-No pain Pain Type: Acute pain Pain Location: Shoulder Pain Orientation: Right Pain Frequency: Intermittent Pain Intervention(s): Medication (See eMAR);Repositioned        See Function Navigator for Current Functional Status.   Therapy/Group: Individual Therapy  Lisa Roca 06/02/2015, 8:00 AM

## 2015-06-02 NOTE — Plan of Care (Signed)
Problem: RH SAFETY Goal: RH STG ADHERE TO SAFETY PRECAUTIONS W/ASSISTANCE/DEVICE STG Adhere to Safety Precautions With min Assistance/Device.  Outcome: Not Progressing impulsive

## 2015-06-03 NOTE — Progress Notes (Signed)
Brooke [name] is a 59 y.o. female 1956-03-09 [id number]  Subjective: No new complaints. No new problems. Feeling OK.  Objective: Vital signs in last 24 hours: Temp:  [98.2 F (36.8 C)-98.6 F (37 C)] 98.2 F (36.8 C) (12/25 0509) Pulse Rate:  [78-91] 78 (12/25 0509) Resp:  [17] 17 (12/25 0509) BP: (100-106)/(55-61) 106/55 mmHg (12/25 0509) SpO2:  [99 %-100 %] 99 % (12/25 0509) Weight change:  Last BM Date: 06/01/15  Intake/Output from previous day:   Last cbgs: CBG (last 3)  No results for input(s): GLUCAP in the last 72 hours.   Physical Exam General: No apparent distress   HEENT: not dry Lungs: Normal effort. Lungs clear to auscultation, no crackles or wheezes. Cardiovascular: Regular rate and rhythm, no edema Abdomen: S/NT/ND; BS(+) Musculoskeletal:  unchanged Neurological: No new neurological deficits Wounds: N/A    Skin: clear  Aging changes Mental state: Alert, dysarthric and aphasic, cooperative    Lab Results: BMET    Component Value Date/Time   NA 137 05/31/2015 0428   K 3.2* 05/31/2015 0428   CL 101 05/31/2015 0428   CO2 28 05/31/2015 0428   GLUCOSE 94 05/31/2015 0428   BUN 6 05/31/2015 0428   CREATININE 0.73 05/31/2015 0428   CREATININE 0.72 06/13/2014 1254   CALCIUM 8.7* 05/31/2015 0428   GFRNONAA >60 05/31/2015 0428   GFRNONAA >89 06/13/2014 1254   GFRAA >60 05/31/2015 0428   GFRAA >89 06/13/2014 1254   CBC    Component Value Date/Time   WBC 5.4 05/31/2015 0428   RBC 4.21 05/31/2015 0428   HGB 13.4 05/31/2015 0428   HCT 41.2 05/31/2015 0428   PLT 165 05/31/2015 0428   MCV 97.9 05/31/2015 0428   MCH 31.8 05/31/2015 0428   MCHC 32.5 05/31/2015 0428   RDW 13.6 05/31/2015 0428   LYMPHSABS 1.9 05/31/2015 0428   MONOABS 0.6 05/31/2015 0428   EOSABS 0.2 05/31/2015 0428   BASOSABS 0.0 05/31/2015 0428    Studies/Results: No results found.  Medications: I have reviewed the patient's current medications.  Assessment/Plan:   1.  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 Continue CIR 2. H/o DVT/PE/Anticoagulation: Pharmaceutical: Xarelto 3. Chronic back pain/Pain Management: Resumed oxycodone. Need to use judiciously to avoid sedation/respiratory suppression.  Will increase gabapentin to twice a day 4. Mood: Monitor for mood for now--patient lacks insight and awareness or deficits. LCSW to follow along and complete evaluation when appropriate.  5. Neuropsych: This patient is not capable of making decisions on her own behalf. 6. Skin/Wound Care: Routine pressure relief measures 7. Fluids/Electrolytes/Nutrition: Monitor I/O. Offer supplements between meals.  Hypokalemia: Replaced on 12/22 8. HTN: Monitor BP bid. Lasix changed to HCTZ by primary team. 9. Asthma/COPD: Encourage IS as able. Avoid narcotic and may need to schedule nebs to help manage wheezing/hypoxia.  10. Morbid obesity: Pressure relief measures. Heart Healthy diet.  11. Mild OSA: Question OHS--may have CPAP at home--unknown as patient has been living with daughter for past 3-4 months, will follow up.  12. Depression/ Anxiety disorder: Used Celexa and ativan at home. On Celexa.     Length of stay, days: 4  Walker Kehr , MD 06/03/2015, 11:11 AM

## 2015-06-04 ENCOUNTER — Inpatient Hospital Stay (HOSPITAL_COMMUNITY): Payer: Medicare HMO

## 2015-06-04 ENCOUNTER — Inpatient Hospital Stay (HOSPITAL_COMMUNITY): Payer: Medicare HMO | Admitting: Occupational Therapy

## 2015-06-04 ENCOUNTER — Inpatient Hospital Stay (HOSPITAL_COMMUNITY): Payer: Medicare HMO | Admitting: Speech Pathology

## 2015-06-04 DIAGNOSIS — R531 Weakness: Secondary | ICD-10-CM

## 2015-06-04 NOTE — Progress Notes (Signed)
Son of patient spoke to rn concerning a bath that was performed by a female NT to his mom. He demanded to speak with the Director. RN advised him that the director will be here in the morning and that RN will address his concern with the charge nurse for the meantime. He also wanted to speak with the social worker to voice out his concern. Charge nurse spoke to the son and gave him options to discuss his concern. He said he will wait till the next morning to talk to the director.

## 2015-06-04 NOTE — Progress Notes (Signed)
Occupational Therapy Session Note  Patient Details  [name] MRN: [id number] Date of Birth: Nov 23, 1955  Today's Date: 06/04/2015 OT Individual Time:  [id number]      Short Term Goals: Week 1:  OT Short Term Goal 1 (Week 1): Pt will perform toileting 3/3 steps with steadying A OT Short Term Goal 2 (Week 1): Pt will initiate using her right hand in ADL performance with mod cuing in a 60 min session  OT Short Term Goal 3 (Week 1): Pt will utilize call bell to notifiy  toileting needs with mod A in a 60 min session  Skilled Therapeutic Interventions/Progress Updates:    Pt. Sitting EOB bed upon OT arrival. Pt upset that her daug took her phone.  Nurse giving meds.  OT obtained clothes and shower bench.  Pt calmed down after everyone stopped talking and onlyOT left in room.  .  Pt ambulated from bed to shower with min quard assist.  Doffed clothes in standing with min assist for balance.  Bathed at shower level using RUE as gross assist.  Dressed self in standing and sitting with min guard for balance.  Pt. Ambulated to bed after standing at sink to brush teeth.   Left ptin bed with all needs in reach.    Precautions Precautions: Fall Precaution Comments: R sided neglect Restrictions Weight Bearing Restrictions: No      Pain:  none         See Function Navigator for Current Functional Status.   Therapy/Group: Individual Therapy  Lisa Roca 06/04/2015, 7:50 AM

## 2015-06-04 NOTE — Progress Notes (Signed)
Speech Language Pathology Daily Session Note  Patient Details  [name] MRN: [id number] Date of Birth: 1956-05-05  Today's Date: 06/04/2015 SLP Individual Time: [id number] SLP Individual Time Calculation (min): 47 min  Short Term Goals: Week 1: SLP Short Term Goal 1 (Week 1): Pt will be intelligible at the word level in 75% of opportunities with max assist verbal cues.  SLP Short Term Goal 2 (Week 1): Pt will follow 2 step commands during basic familiar tasks for 75% accuracy with max assist tactile cues.  SLP Short Term Goal 3 (Week 1): Pt will answer semi-complex yes/no questions for 75% accuracy with mod assist visual cues.   SLP Short Term Goal 4 (Week 1): Pt will name basic, familiar objects for 80% accuracy with mod assist verbal cues.  SLP Short Term Goal 5 (Week 1): Pt will sustain attention to a basic, familiar task for 2 minutes with mod assist verbal cues for redirection to task.   SLP Short Term Goal 6 (Week 1): Pt will consume trials of dys 2 textures with mod assist verbal cues to monitor and correct oral phase deficits over 3 targeted sessions.   Skilled Therapeutic Interventions:  Pt was seen for skilled ST targeting goals for communication and dysphagia.  SLP facilitated the session with trials of dys 2 textures to continue working towards diet progression.  Pt required min assist verbal cues to monitor and correct anterior loss of boluses.  Pt was able to clear residual solids from the oral cavity post swallow with extra swallow and min verbal cues for a thin liquid wash.  No overt s/s of aspiration were evident with solids or liquids.  Recommend a trial meal tray of dys 2 textures at next available appointment prior to advancement.  SLP also facilitated the session with ongoing diagnostic treatment of reading comprehension.  Pt was 100% accurate for matching words and phrases to objects from a field of three with supervision; therefore, reading comprehension appears  grossly intact at the basic word and phrase level.  Pt was also able to name basic, familiar objects for 100% accuracy with mod assist verbal cues to monitor and correct verbal errors.   Additionally, pt was ~75% accurate for sequencing 4 step picture cards with supervision, improving to 100% accurate with mod assist verbal cues for organization.  She was able to sustain her attention to the abovementioned task for ~3 minutes with mod verbal cues for redirection.  She exhibited greater difficulty with self regulation of verbal errors in connected speech when describing actions in the abovementioned sequencing task and needed max assist verbal and visual cues to correct verbal errors.  Pt was returned to room and left in bed with her cousin at bedside.  Continue per current plan of care.    Function:  Eating Eating   Modified Consistency Diet: Yes Eating Assist Level: Set up assist for;Supervision or verbal cues;Helper checks for pocketed food   Eating Set Up Assist For: Opening containers       Cognition Comprehension Comprehension assist level: Understands basic 75 - 89% of the time/ requires cueing 10 - 24% of the time  Expression   Expression assist level: Expresses basic 50 - 74% of the time/requires cueing 25 - 49% of the time. Needs to repeat parts of sentences.  Social Interaction Social Interaction assist level: Interacts appropriately 75 - 89% of the time - Needs redirection for appropriate language or to initiate interaction.  Problem Solving Problem solving assist level: Solves  basic 50 - 74% of the time/requires cueing 25 - 49% of the time  Memory Memory assist level: Recognizes or recalls 25 - 49% of the time/requires cueing 50 - 75% of the time    Pain Pain Assessment Pain Assessment: No/denies pain  Therapy/Group: Individual Therapy  Korrin Waterfield, Selinda Orion 06/04/2015, 3:52 PM

## 2015-06-04 NOTE — Progress Notes (Signed)
[place] PHYSICAL MEDICINE & REHABILITATION     PROGRESS NOTE    Subjective/Complaints:  Pt seen this AM resting in bed.  Her main difficulty appears to be finding.  ROS: Denies CP, SOB, n/v/d.  Objective: Vital Signs: Blood pressure 107/57, pulse 84, temperature 98.5 F (36.9 C), temperature source Oral, resp. rate 18, last menstrual period 05/24/2003, SpO2 100 %. No results found. No results for input(s): WBC, HGB, HCT, PLT in the last 72 hours. No results for input(s): NA, K, CL, GLUCOSE, BUN, CREATININE, CALCIUM in the last 72 hours.  Invalid input(s): CO CBG (last 3)  No results for input(s): GLUCAP in the last 72 hours.  Wt Readings from Last 3 Encounters:  05/26/15 108.6 kg (239 lb 6.7 oz)  04/22/15 110.814 kg (244 lb 4.8 oz)  11/21/14 104.781 kg (231 lb)   without masses no flank yesterday is dressing recommendation is warm he is also is that she has surgery is we continue to:  Bathing 14 pacemaker on his safety for all his continue office call tomorrow Physical Exam:  BP 107/57 mmHg  Pulse 84  Temp(Src) 98.5 F (36.9 C) (Oral)  Resp 18  SpO2 100%  LMP 05/24/2003 Constitutional: She appears well-developed and well-nourished. Vital signs reviewed.  Obese female.  HENT: Normocephalic and atraumatic. Eyes: Conjunctivae and EOM are normal.  Neck: Normal range of motion. Neck supple.  Cardiovascular: Normal rate and regular rhythm.  Respiratory: Effort normal. No respiratory distress.  GI: Soft. Bowel sounds are normal. She exhibits no distension. There is no tenderness.  Musculoskeletal: She exhibits no edema or tenderness.  Neurological: She is alert.  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 Word finding difficulty remains  Impulsive and lacks insight/awareness of deficits.  Sensation appears to be diminished RLE Motor: LUE/LLE: Appears to be 4+/5 proximal to distal RUE: Appears to be 4-/5 shoulder abduction, elbow  flexion/extension, 0/5 finger grip RLE: Appears to be 2+/5 proximal to distal  Skin: Skin is warm and dry.  Psychiatric: Her speech is slurred. She expresses impulsivity.   Assessment/Plan: 1. Functional deficits secondary to left MCA infarct which require 3+ hours per day of interdisciplinary therapy in a comprehensive inpatient rehab setting. Physiatrist is providing close team supervision and 24 hour management of active medical problems listed below. Physiatrist and rehab team continue to assess barriers to discharge/monitor patient progress toward functional and medical goals.  Function:  Bathing Bathing position   Position: Shower  Bathing parts Body parts bathed by patient: Right arm, Chest, Abdomen, Front perineal area, Right upper leg, Left upper leg, Buttocks Body parts bathed by helper: Back, Left lower leg, Right lower leg, Left arm  Bathing assist Assist Level: Touching or steadying assistance(Pt > 75%)      Upper Body Dressing/Undressing Upper body dressing   What is the patient wearing?: Pull over shirt/dress     Pull over shirt/dress - Perfomed by patient: Thread/unthread left sleeve Pull over shirt/dress - Perfomed by helper: Thread/unthread right sleeve, Put head through opening, Pull shirt over trunk        Upper body assist Assist Level: Touching or steadying assistance(Pt > 75%)      Lower Body Dressing/Undressing Lower body dressing   What is the patient wearing?: Pants, Non-skid slipper socks     Pants- Performed by patient: Thread/unthread left pants leg Pants- Performed by helper: Thread/unthread right pants leg, Pull pants up/down Non-skid slipper socks- Performed by patient: Don/doff right sock, Don/doff left sock  Lower body assist Assist for lower body dressing: Touching or steadying assistance (Pt > 75%)      Toileting Toileting   Toileting steps completed by patient: Performs perineal hygiene, Adjust clothing  prior to toileting Toileting steps completed by helper: Adjust clothing after toileting Toileting Assistive Devices: Grab bar or rail  Toileting assist Assist level: Touching or steadying assistance (Pt.75%)   Transfers Chair/bed transfer   Chair/bed transfer method: Ambulatory Chair/bed transfer assist level: Touching or steadying assistance (Pt > 75%)       Locomotion Ambulation     Max distance: 175 Assist level: Supervision or verbal cues   Wheelchair Wheelchair activity did not occur: Refused     Assist Level: Dependent (Pt equals 0%)  Cognition Comprehension Comprehension assist level: Understands basic 75 - 89% of the time/ requires cueing 10 - 24% of the time  Expression Expression assist level: Expresses basic 25 - 49% of the time/requires cueing 50 - 75% of the time. Uses single words/gestures.  Social Interaction Social Interaction assist level: Interacts appropriately 75 - 89% of the time - Needs redirection for appropriate language or to initiate interaction.  Problem Solving Problem solving assist level: Solves basic 25 - 49% of the time - needs direction more than half the time to initiate, plan or complete simple activities  Memory Memory assist level: Recognizes or recalls 25 - 49% of the time/requires cueing 50 - 75% of the time    Medical Problem List and Plan: 1. 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  Continue CIR 2. H/o DVT/PE/Anticoagulation: Pharmaceutical: Xarelto 3. Chronic back pain/Pain Management: Resumed oxycodone. Need to use judiciously to avoid sedation/respiratory suppression.   Gabapentin  increased to twice a day 4. Mood: Monitor for mood for now--patient lacks insight and awareness or deficits. LCSW to follow along and complete evaluation when appropriate.  5. Neuropsych: This patient is not capable of making decisions on her own behalf. 6. Skin/Wound Care: Routine pressure relief measures 7.  Fluids/Electrolytes/Nutrition: Monitor I/O. Offer supplements between meals.   Hypokalemia: Replaced on 12/22   Will recheck labs tomorrow  8. HTN: Monitor BP bid. Lasix changed to HCTZ by primary team. 9. Asthma/COPD: Encourage IS as able. Avoid narcotic and may need to schedule nebs to help manage wheezing/hypoxia.  10. Morbid obesity: Pressure relief measures. Heart Healthy diet.  11. Mild OSA: Question OHS--may have CPAP at home--unknown as patient has been living with daughter for past 3-4 months, will follow up.  12. Depression/ Anxiety disorder: Used Celexa and ativan at home. Resume Celexa.  13. Hypoalbuminemia: Started supplementation on 12/22  Will recheck tomorrow  LOS (Days) 5 A FACE TO FACE EVALUATION WAS PERFORMED  Ankit Lorie Phenix 06/04/2015 9:13 AM

## 2015-06-04 NOTE — Progress Notes (Addendum)
Physical Therapy Session Note  Patient Details  [name] MRN: [id number] Date of Birth: [birth year]  Today's Date: 06/04/2015 PT Individual Time: 0800-0900; 1117-1205 PT Individual Time Calculation (min): 60 min , 48 min  Short Term Goals: Week 1:  PT Short Term Goal 1 (Week 1): = LTGs due ELOS  Skilled Therapeutic Interventions/Progress Updates: pt conveyed she had some back pain, and was waiting for meds.  Unable to rate..  Pt upset because w/c did not transfer with her to different room; PT assured her that she is doing well and does not need one.  Gait without AD, hand hold assist on L x 150' . neuromuscular re-education via forced use, VCs, manual cues for R attention, RLE stance stability on: Kinetron at level 4 x 8 minutes; with wide RW, step/taps R/L x 10  onto 4" high stool, and R step ups x 10 onto 4" high stool; agility floor ladder forward, R/L laterally with min guard assist; 2 x 10 bil bridging with ball between knees to activate core muscles..    Gait with RW x 90' x  With mod cues for attention to R hand on grip.  Up/down 12 steps 2 rails close supervision, with self selected step through method leading with r foot. At top of steps, pt stumbled slightly but did not lose balance.  Up down curb/ramp with hand hold assistance.  Family ed with dtr Brooke [name] for curb/step, safely. Pt indicated she did not like using RW, so will not use for now.  Tx 2:  Pt stated she had some L lower back pain; unable to rate.  RN notified at end of session. Gait wtithout AD on level tile and carpet x 200' with supervision, without LOB, but R foot heard to be scuffing carpet in day room.   With VCs to lift knees, pt able to avoid this, but communicated that she shuffled her feet before, stating she was "lazy". Therapeutic activity using R hand to hold, transport and tip for watering house plants in day room, with L hand under watering can for safety.  neuromuscular re-education via visual feedback,  manual and tactile cues and demo for safe use of R hand for this activity. Pt judged accurately which plants needed to be watered. Balance retraining in standing with bil UE support fading to 0 UE support, for box step leading with L foot x 15, and R foot x 15, without LOB or stumbling.  Pt demonstrated decreased attention to R hand and foot throughout session, requiring mod cues. Pt left sitting EOb in room, with bed alarm set and mother in room. Therapy Documentation Precautions:  Precautions Precautions: Fall Precaution Comments: R sided neglect Restrictions Weight Bearing Restrictions: No        See Function Navigator for Current Functional Status.   Therapy/Group: Individual Therapy  Samba Cumba 06/04/2015, 11:17 AM

## 2015-06-04 NOTE — Progress Notes (Signed)
Social Work Patient ID: Brooke [name], female   DOB: 09/26/1955, 59 y.o.   MRN: [id number] Met with team due top pt and family want a target discharge date and all three feel by Friday 12/30 she would reach her goals and be ready for discharge. Have spoke with pt and cousin in her room and left a message for her daughter-Keyna regarding this target discharge date. Aware team to discuss again on Wed at team conference. Pt really wants regular food and not the pureed, Nicole-SP to address this.

## 2015-06-05 ENCOUNTER — Inpatient Hospital Stay (HOSPITAL_COMMUNITY): Payer: Medicare HMO | Admitting: Occupational Therapy

## 2015-06-05 ENCOUNTER — Inpatient Hospital Stay (HOSPITAL_COMMUNITY): Payer: Medicare HMO | Admitting: Physical Therapy

## 2015-06-05 ENCOUNTER — Inpatient Hospital Stay (HOSPITAL_COMMUNITY): Payer: Medicare HMO | Admitting: Speech Pathology

## 2015-06-05 LAB — BASIC METABOLIC PANEL
ANION GAP: 6 (ref 5–15)
BUN: 9 mg/dL (ref 6–20)
CALCIUM: 8.7 mg/dL — AB (ref 8.9–10.3)
CHLORIDE: 102 mmol/L (ref 101–111)
CO2: 30 mmol/L (ref 22–32)
Creatinine, Ser: 0.81 mg/dL (ref 0.44–1.00)
GFR calc non Af Amer: >60 mL/min (ref >60)
Glucose, Bld: 101 mg/dL — ABNORMAL HIGH (ref 65–99)
Potassium: 4.4 mmol/L (ref 3.5–5.1)
Sodium: 138 mmol/L (ref 135–145)

## 2015-06-05 LAB — ALBUMIN: Albumin: 3.1 g/dL — ABNORMAL LOW (ref 3.5–5.0)

## 2015-06-05 MED ORDER — GRX ANALGESIC BALM EX OINT
1.0000 "application " | TOPICAL_OINTMENT | Freq: Two times a day (BID) | CUTANEOUS | Status: DC | PRN
  Filled 2015-06-05: qty 28

## 2015-06-05 MED ORDER — WHITE PETROLATUM GEL
Status: AC
  Administered 2015-06-05: 13:00:00
  Filled 2015-06-05: qty 1

## 2015-06-05 NOTE — Progress Notes (Signed)
Occupational Therapy Session Note  Patient Details  [name] MRN: [id number] Date of Birth: 08/19/1955  Today's Date: 06/05/2015 OT Individual Time: 0900-0930 OT Individual Time Calculation (min): 30 min    Short Term Goals: Week 1:  OT Short Term Goal 1 (Week 1): Pt will perform toileting 3/3 steps with steadying A OT Short Term Goal 2 (Week 1): Pt will initiate using her right hand in ADL performance with mod cuing in a 60 min session  OT Short Term Goal 3 (Week 1): Pt will utilize call bell to notifiy  toileting needs with mod A in a 60 min session  Skilled Therapeutic Interventions/Progress Updates: Patient participated in skilled OT to address R UE and bilateral integration.  She completed bilateral UE retraining for oral care.   Patient ambulated with safe supervision from bed to sink and completed oral care.   Toothpaste  slipped out of right hand x2 when she focused on left hand tasks for the oral session with 3 reminders to use right hand.  Also patient practiced resting forearms on hi-lo table to ease right shoulder pain with flexion past 80 degrees from what she described as "arthritis."     Therapy Documentation Precautions:  Precautions Precautions: Fall Precaution Comments: R sided neglect Restrictions Weight Bearing Restrictions: No    Pain:denied  See Function Navigator for Current Functional Status.  Therapy/Group: Individual Therapy  Alfredia Ferguson [facility] 06/05/2015, 9:47 AM

## 2015-06-05 NOTE — Progress Notes (Addendum)
[place] PHYSICAL MEDICINE & REHABILITATION     PROGRESS NOTE    Subjective/Complaints:  C/o LBP,Also upset about breakfast and coffee  ROS: Denies CP, SOB, n/v/d.  Objective: Vital Signs: Blood pressure 110/61, pulse 76, temperature 98.2 F (36.8 C), temperature source Oral, resp. rate 17, last menstrual period 05/24/2003, SpO2 100 %. No results found. No results for input(s): WBC, HGB, HCT, PLT in the last 72 hours.  Recent Labs  06/05/15 0417  NA 138  K 4.4  CL 102  GLUCOSE 101*  BUN 9  CREATININE 0.81  CALCIUM 8.7*   CBG (last 3)  No results for input(s): GLUCAP in the last 72 hours.  Wt Readings from Last 3 Encounters:  05/26/15 108.6 kg (239 lb 6.7 oz)  04/22/15 110.814 kg (244 lb 4.8 oz)  11/21/14 104.781 kg (231 lb)   without masses no flank yesterday is dressing recommendation is warm he is also is that she has surgery is we continue to:  Bathing 14 pacemaker on his safety for all his continue office call tomorrow Physical Exam:  BP 110/61 mmHg  Pulse 76  Temp(Src) 98.2 F (36.8 C) (Oral)  Resp 17  SpO2 100%  LMP 05/24/2003 Constitutional: She appears well-developed and well-nourished. Vital signs reviewed.  Obese female.  HENT: Normocephalic and atraumatic. Eyes: Conjunctivae and EOM are normal.  Neck: Normal range of motion. Neck supple.  Cardiovascular: Normal rate and regular rhythm.  Respiratory: Effort normal. No respiratory distress.  GI: Soft. Bowel sounds are normal. She exhibits no distension. There is no tenderness.  Musculoskeletal: Tenderness along cervical thoracic and paraspinal areas Neurological: She is alert.  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 Word finding difficulty remains  Impulsive and lacks insight/awareness of deficits.  Sensation appears to be diminished RLE Motor: LUE/LLE: Appears to be 4+/5 proximal to distal RUE: Appears to be 4-/5 shoulder abduction, elbow  flexion/extension, Trace finger grip RLE: Appears to be 2+/5 proximal to distal  Skin: Skin is warm and dry.  Psychiatric: Her speech is slurred. She expresses impulsivity.   Assessment/Plan: 1. Functional deficits secondary to left MCA infarct which require 3+ hours per day of interdisciplinary therapy in a comprehensive inpatient rehab setting. Physiatrist is providing close team supervision and 24 hour management of active medical problems listed below. Physiatrist and rehab team continue to assess barriers to discharge/monitor patient progress toward functional and medical goals.  Function:  Bathing Bathing position   Position: Shower  Bathing parts Body parts bathed by patient: Right arm, Chest, Abdomen, Front perineal area, Right upper leg, Left upper leg, Buttocks, Left arm Body parts bathed by helper: Back, Left lower leg, Right lower leg  Bathing assist Assist Level: Touching or steadying assistance(Pt > 75%)      Upper Body Dressing/Undressing Upper body dressing   What is the patient wearing?: Pull over shirt/dress     Pull over shirt/dress - Perfomed by patient: Thread/unthread left sleeve Pull over shirt/dress - Perfomed by helper: Thread/unthread right sleeve, Put head through opening, Pull shirt over trunk        Upper body assist Assist Level: Touching or steadying assistance(Pt > 75%) (cues to put RUE in arm sleeve and pull down)      Lower Body Dressing/Undressing Lower body dressing   What is the patient wearing?: Pants, Non-skid slipper socks     Pants- Performed by patient: Thread/unthread left pants leg, Thread/unthread right pants leg, Pull pants up/down Pants- Performed by helper: Thread/unthread right pants  leg, Pull pants up/down Non-skid slipper socks- Performed by patient: Don/doff right sock, Don/doff left sock                    Lower body assist Assist for lower body dressing: Touching or steadying assistance (Pt > 75%)       Toileting Toileting   Toileting steps completed by patient: Performs perineal hygiene, Adjust clothing prior to toileting Toileting steps completed by helper: Adjust clothing after toileting Toileting Assistive Devices: Grab bar or rail  Toileting assist Assist level: Touching or steadying assistance (Pt.75%)   Transfers Chair/bed transfer   Chair/bed transfer method: Ambulatory Chair/bed transfer assist level: Supervision or verbal cues       Locomotion Ambulation     Max distance: 157 Assist level: Touching or steadying assistance (Pt > 75%)   Wheelchair Wheelchair activity did not occur: Refused     Assist Level: Dependent (Pt equals 0%)  Cognition Comprehension Comprehension assist level: Understands basic 75 - 89% of the time/ requires cueing 10 - 24% of the time  Expression Expression assist level: Expresses basic 50 - 74% of the time/requires cueing 25 - 49% of the time. Needs to repeat parts of sentences.  Social Interaction Social Interaction assist level: Interacts appropriately 75 - 89% of the time - Needs redirection for appropriate language or to initiate interaction.  Problem Solving Problem solving assist level: Solves basic 50 - 74% of the time/requires cueing 25 - 49% of the time  Memory Memory assist level: Recognizes or recalls 25 - 49% of the time/requires cueing 50 - 75% of the time    Medical Problem List and Plan: 1. Right sided weakness, apraxia, dysarthria secondary to left MCA infarct.Ambulating with min assist this morning with physical therapy. Team conference today please see physician documentation under team conference tab, met with team face-to-face to discuss problems,progress, and goals. Formulized individual treatment plan based on medical history, underlying problem and comorbidities.  Continue CIR- plan D/C at end of week 2. H/o DVT/PE/Anticoagulation: Pharmaceutical: Xarelto 3. Chronic back pain/Pain Management: Resumed oxycodone.  Add  Kpad and sportscreme Need to use judiciously to avoid sedation/respiratory suppression.   Current complaints appear to be muscular, continue local care and should improve with time 4. Mood: Monitor for mood for now--patient lacks insight and awareness or deficits. LCSW to follow along and complete evaluation when appropriate.  5. Neuropsych: This patient is not capable of making decisions on her own behalf. 6. Skin/Wound Care: Routine pressure relief measures 7. Fluids/Electrolytes/Nutrition: Monitor I/O. Offer supplements between meals.      8. HTN: Monitor BP bid. Lasix changed to HCTZ , Avoid hypotension 9. Asthma/COPD: Encourage IS as able. Avoid narcotic and may need to schedule nebs to help manage wheezing/hypoxia.  10. Morbid obesity: Pressure relief measures. Heart Healthy diet.  11. Mild OSA: Question OHS--may have CPAP at home--unknown as patient has been living with daughter for past 3-4 months, will follow up.  12. Depression/ Anxiety disorder: Used Celexa and ativan at home. Resume Celexa.  13. Hypoalbuminemia: Started supplementation on 12/22    LOS (Days) 6 A FACE TO FACE EVALUATION WAS PERFORMED  Arbell Wycoff E 06/05/2015 8:30 AM

## 2015-06-05 NOTE — Progress Notes (Signed)
Speech Language Pathology Daily Session Note  Patient Details  [name] MRN: [id number] Date of Birth: [birth year]  Today's Date: 06/05/2015 SLP Individual Time: 1300-1400 SLP Individual Time Calculation (min): 60 min  Short Term Goals: Week 1: SLP Short Term Goal 1 (Week 1): Pt will be intelligible at the word level in 75% of opportunities with max assist verbal cues.  SLP Short Term Goal 2 (Week 1): Pt will follow 2 step commands during basic familiar tasks for 75% accuracy with max assist tactile cues.  SLP Short Term Goal 3 (Week 1): Pt will answer semi-complex yes/no questions for 75% accuracy with mod assist visual cues.   SLP Short Term Goal 4 (Week 1): Pt will name basic, familiar objects for 80% accuracy with mod assist verbal cues.  SLP Short Term Goal 5 (Week 1): Pt will sustain attention to a basic, familiar task for 2 minutes with mod assist verbal cues for redirection to task.   SLP Short Term Goal 6 (Week 1): Pt will consume trials of dys 2 textures with mod assist verbal cues to monitor and correct oral phase deficits over 3 targeted sessions.   Skilled Therapeutic Interventions:  Pt was seen for skilled ST targeting goals for cognition and dysphagia.  SLP facilitated the session with a trial meal tray of dys 2 textures to assess readiness for advancement.  Pt consumed advanced solids with min faded to supervision verbal cues to monitor and correct anterior loss of boluses.  Pt also benefited from min assist verbal cues for use of liquid wash to further clear residual solids from the oral cavity post swallow.  No overt s/s of aspiration evident with solids or liquids. Recommend that pt's diet be advanced to dys 2 textures, meds whole in puree with continued thin liquids and full supervision for use of swallowing precautions.  SLP also facilitated the session with a basic card game targeting sustained attention to task.  Pt was able to sustain her attention to task for  ~3-5 minute intervals with min assist verbal cues for redirection to task.  She was also able to plan and execute a problem solving strategy during the abovementioned task with overall mod assist verbal cues.  Pt was returned to room and left in bed with call bell within reach.  Continue per current plan of care.    Function:  Eating Eating   Modified Consistency Diet: Yes Eating Assist Level: Set up assist for;Supervision or verbal cues;Helper checks for pocketed food           Cognition Comprehension Comprehension assist level: Understands basic 75 - 89% of the time/ requires cueing 10 - 24% of the time  Expression   Expression assist level: Expresses basic 50 - 74% of the time/requires cueing 25 - 49% of the time. Needs to repeat parts of sentences.  Social Interaction Social Interaction assist level: Interacts appropriately 75 - 89% of the time - Needs redirection for appropriate language or to initiate interaction.  Problem Solving Problem solving assist level: Solves basic 50 - 74% of the time/requires cueing 25 - 49% of the time  Memory Memory assist level: Recognizes or recalls 25 - 49% of the time/requires cueing 50 - 75% of the time    Pain Pain Assessment Pain Assessment: 0-10 Faces Pain Scale: Hurts whole lot Pain Type: Acute pain Pain Location: Back Pain Descriptors / Indicators: Aching Pain Intervention(s): RN made aware  Therapy/Group: Individual Therapy  Quintan Saldivar, Selinda Orion 06/05/2015, 3:40 PM

## 2015-06-05 NOTE — Progress Notes (Signed)
Occupational Therapy Session Note  Patient Details  [name] MRN: [id number] Date of Birth: 05-02-1956  Today's Date: 06/05/2015 OT Individual Time: 1000-1057 OT Individual Time Calculation (min): 57 min    Short Term Goals: Week 1:  OT Short Term Goal 1 (Week 1): Pt will perform toileting 3/3 steps with steadying A OT Short Term Goal 2 (Week 1): Pt will initiate using her right hand in ADL performance with mod cuing in a 60 min session  OT Short Term Goal 3 (Week 1): Pt will utilize call bell to notifiy  toileting needs with mod A in a 60 min session  Skilled Therapeutic Interventions/Progress Updates:    Upon entering the room, pt seated in recliner chair with no c/o pain this session. Pt requesting to finish dressing UB. Pt donning pull over shirt with mod A to thread R UE and to pull over neck this session. Pt requiring increased verbal cues for task secondary to speed at which pt attempting to put shirt on. Pt seated and writing full name with pen in R hand display dynamic tripod grasp. First name is legible but pt having difficulty with last hand and rewriting several times until you could read letters. Pt unable to verbally state each letter in name correctly. Pt is oriented to location, situation, self, and time. Pt ambulating 100' to dayroom with close supervision and steady assist with turn to the R and when tiled surface changed to carpet. Pt taking seated rest break before returning to room. Pt requesting to return to bed to rest with supervision for bed mobility and sit >supine. Bed alarm activated with call bell and all needed items within reach upon exiting the room.   Therapy Documentation Precautions:  Precautions Precautions: Fall Precaution Comments: R sided neglect Restrictions Weight Bearing Restrictions: No  See Function Navigator for Current Functional Status.   Therapy/Group: Individual Therapy  Phineas Semen 06/05/2015, 12:14 PM

## 2015-06-05 NOTE — Progress Notes (Signed)
Physical Therapy Note  Patient Details  [name] MRN: [id number] Date of Birth: 1956/05/08 Today's Date: 06/05/2015    Time: 730-830 60 minutes  1:1 No c/o pain. Pt able to gait throughout unit with supervision in controlled environment. Stair negotiation with 2 handrails 12 stairs x 2 with supervision.  Gait on uneven surface with bending and reaching tasks with close supervision, no LOB. Pt able to gait 100' on uneven surface without fatigue or LOB. Pt performed seated and standing balance with reachign items off of floor with close supervision. Pt then asks to eat breakfast. PT supervised breakfast with cues to attend to R hand and clear food from R side of mouth. Pt able to open all containers without assistance.   DONAWERTH,KAREN 06/05/2015, 8:28 AM

## 2015-06-06 ENCOUNTER — Inpatient Hospital Stay (HOSPITAL_COMMUNITY): Payer: Medicare HMO | Admitting: Occupational Therapy

## 2015-06-06 ENCOUNTER — Inpatient Hospital Stay (HOSPITAL_COMMUNITY): Payer: Medicare HMO | Admitting: Speech Pathology

## 2015-06-06 ENCOUNTER — Inpatient Hospital Stay (HOSPITAL_COMMUNITY): Payer: Medicare HMO

## 2015-06-06 NOTE — Progress Notes (Signed)
Speech Language Pathology Daily Session Note  Patient Details  [name] MRN: [id number] Date of Birth: 08/28/1955  Today's Date: 06/06/2015 SLP Individual Time: 1300-1400 SLP Individual Time Calculation (min): 60 min  Short Term Goals: Week 1: SLP Short Term Goal 1 (Week 1): Pt will be intelligible at the word level in 75% of opportunities with max assist verbal cues.  SLP Short Term Goal 2 (Week 1): Pt will follow 2 step commands during basic familiar tasks for 75% accuracy with max assist tactile cues.  SLP Short Term Goal 3 (Week 1): Pt will answer semi-complex yes/no questions for 75% accuracy with mod assist visual cues.   SLP Short Term Goal 4 (Week 1): Pt will name basic, familiar objects for 80% accuracy with mod assist verbal cues.  SLP Short Term Goal 5 (Week 1): Pt will sustain attention to a basic, familiar task for 2 minutes with mod assist verbal cues for redirection to task.   SLP Short Term Goal 6 (Week 1): Pt will consume trials of dys 2 textures with mod assist verbal cues to monitor and correct oral phase deficits over 3 targeted sessions.   Skilled Therapeutic Interventions:  Pt was seen for skilled ST targeting family education.  Pt's daughter Brooke [name] was present during today's session.  SLP provided skilled education with demonstration during a functional meal regarding techniques to maximize pt's safety for swallowing.  Pt's daughter returned demonstration of cuing techniques to facilitate pt's use of safe swallowing strategies appropriately and she is now signed off to supervise pt during meals.  Pt demonstrated good toleration of dys 2 textures as evidenced by her ability to clear solids from the oral cavity post swallow and to monitor and correct anterior labial loss of boluses with supervision cues from daughter.  Will follow up for trials of advanced solids at next available appointment to further progress pt's diet prior to discharge.  SLP also demonstrated  skilled communication techniques to maximize pt's functional independence for conveying needs and wants to caregivers during a generative naming task.  Pt's daughter then returned demonstration of techniques at the min assist level during the abovementioned task.  SLP also provided pt's daughter with education and handouts for distraction management techniques, activities for cognitive remediation/compensation, and supported conversation techniques for adults with aphasia.  All questions were answered to pt's and daughter's satisfaction at this time.  Pt was left sitting upright at edge of bed with daughter present at bedside.     Continue per current plan of care.    Function:  Eating Eating   Modified Consistency Diet: Yes Eating Assist Level: Set up assist for;Supervision or verbal cues   Eating Set Up Assist For: Opening containers       Cognition Comprehension Comprehension assist level: Understands basic 75 - 89% of the time/ requires cueing 10 - 24% of the time  Expression   Expression assist level: Expresses basic 50 - 74% of the time/requires cueing 25 - 49% of the time. Needs to repeat parts of sentences.  Social Interaction Social Interaction assist level: Interacts appropriately 75 - 89% of the time - Needs redirection for appropriate language or to initiate interaction.  Problem Solving Problem solving assist level: Solves basic 50 - 74% of the time/requires cueing 25 - 49% of the time  Memory Memory assist level: Recognizes or recalls 25 - 49% of the time/requires cueing 50 - 75% of the time    Pain Pain Assessment Pain Assessment: No/denies pain  Therapy/Group: Individual Therapy  [place], Selinda Orion 06/06/2015, 2:09 PM

## 2015-06-06 NOTE — Progress Notes (Signed)
Occupational Therapy Session Note  Patient Details  [name] MRN: [id number] Date of Birth: 02/02/1956  Today's Date: 06/06/2015 OT Individual Time: [id number] OT Individual Time Calculation (min): 58 min    Short Term Goals: Week 1:  OT Short Term Goal 1 (Week 1): Pt will perform toileting 3/3 steps with steadying A OT Short Term Goal 2 (Week 1): Pt will initiate using her right hand in ADL performance with mod cuing in a 60 min session  OT Short Term Goal 3 (Week 1): Pt will utilize call bell to notifiy  toileting needs with mod A in a 60 min session  Skilled Therapeutic Interventions/Progress Updates:    Pt's daughter present for session, completed family education on shower/tub transfers, toilet transfers, and ADL.  She was able to assist her mom in all aspects but did need cueing to not give her as many direct cues but give her more subtle cues or questioning cues instead.  Pt overall supervision for all transfers.  Min assist for pulling pullover shirt over her back and then she was not able to tie her shoes this session.  Daughter and pt report that she normally just slid her shoes on at home without tying and untying.  Pt left in bedside chair with daughter assisting her with her hair.  Noted pt with decreased coordination and ability to consistently hold the toothbrush and use in her right hand.    Therapy Documentation Precautions:  Precautions Precautions: Fall Precaution Comments: R sided neglect Restrictions Weight Bearing Restrictions: No  Pain: Pain Assessment Pain Assessment: Faces Faces Pain Scale: Hurts a little bit Pain Type: Acute pain Pain Location: Shoulder Pain Orientation: Right Pain Descriptors / Indicators: Discomfort Pain Intervention(s): Repositioned ADL: See Function Navigator for Current Functional Status.   Therapy/Group: Individual Therapy  Mashonda Broski OTR/L 06/06/2015, 12:55 PM

## 2015-06-06 NOTE — Progress Notes (Signed)
Physical Therapy Session Note  Patient Details  [name] MRN: [id number] Date of Birth: [birth year]  Today's Date: 06/06/2015 PT Individual Time: 0905-1005 PT Individual Time Calculation (min): 60 min   Short Term Goals: Week 1:  PT Short Term Goal 1 (Week 1): = LTGs due ELOS Week 2:LTGS   Skilled Therapeutic Interventions/Progress Updates: RN reported that pt was unhappy because of late tray.  When tray arrived, pt had oatmeal rather than grits.  She was agreeable to making grits in ADL kitchen.  Gait on level tile and carpet room > ADL apt.  Once there, pt refused to participate in making grits, stating "I don't Brooke [name]! " repeatedly, and becoming agitated and walking out.  Gait to gym, where pt was agreeable to using NuStep at level 4 with bil UEs and bil LEs x 15 min.  dtr arrived and calmed pt down, and said she would buy her grits and eggs in cafeteria.  Gait up/down 12 corner steps with self selected step through method leading with RLE up and LLe down, without knee buckling or stumble at top of stairs.  Balance retraining activity in standing, tapping floor clock targets with R or L foot, without LOB but difficulty visually finding correct targets by number.  Family ed with dtr for up/down threshold/curb without AD, min guard assist, x 2 safely, and gait on level tile back to room. Pt left sittting EOB with bed alarm on, with dtr in room opening a breakfast tray from cafeteria for her.  PT informed NT.    Therapy Documentation Precautions:  Precautions Precautions: Fall Precaution Comments: R sided neglect Restrictions Weight Bearing Restrictions: No Pain: Pain Assessment Pain Score: 4  Faces Pain Scale: Hurts a little bit Pain Location: Back Pain Intervention(s): Refused      See Function Navigator for Current Functional Status.   Therapy/Group: Individual Therapy  Brooke [name] 06/06/2015, 11:21 AM

## 2015-06-06 NOTE — Progress Notes (Signed)
Occupational Therapy Session Note  Patient Details  [name] MRN: [id number] Date of Birth: 1955-10-09  Today's Date: 06/06/2015 OT Individual Time: 1300-1416 OT Individual Time Calculation (min): 76 min    Skilled Therapeutic Interventions/Progress Updates:    Pt completed donning shoes EOB with therapist assisting with tying shoes.  She then ambulated to the therapy gym with min guard assist.  Increased pain in left hip region noted during ambulation.  She worked on [employer] coordination during session.  Had her work attempt 9 hole peg test but she was unsuccessful with any attempts after 30 seconds and stopped.  Used larger 1" pegs and had her work on placing them into the peg board.  She was able to pick up the pegs that had coban wrapped around them but could not pick up the ones without.  If pegs were presented on their end she was able to grasp and place them however.  Once completed removing pegs had pt walk back to her room.  She removed her shoes and returned back to the bed.  Bed alarm in place and call button and phone within reach.    Therapy Documentation Precautions:  Precautions Precautions: Fall Precaution Comments: R sided neglect Restrictions Weight Bearing Restrictions: No  Pain: Pain Assessment Pain Assessment: No/denies pain Faces Pain Scale: Hurts a little bit Pain Type: Acute pain Pain Location: Shoulder Pain Orientation: Right Pain Descriptors / Indicators: Discomfort Pain Intervention(s): Repositioned ADL: See Function Navigator for Current Functional Status.   Therapy/Group: Individual Therapy  Joyelle Siedlecki OTR/L 06/06/2015, 3:46 PM

## 2015-06-06 NOTE — Plan of Care (Signed)
Problem: RH Toilet Transfers Goal: LTG Patient will perform toilet transfers w/assist (OT) LTG: Patient will perform toilet transfers with assist, with/without cues using equipment (OT)  Goals downgraded to supervision level for safety.

## 2015-06-07 ENCOUNTER — Inpatient Hospital Stay (HOSPITAL_COMMUNITY): Payer: Medicare HMO

## 2015-06-07 ENCOUNTER — Inpatient Hospital Stay (HOSPITAL_COMMUNITY): Payer: Medicare HMO | Admitting: Speech Pathology

## 2015-06-07 ENCOUNTER — Inpatient Hospital Stay (HOSPITAL_COMMUNITY): Payer: Medicare HMO | Admitting: Occupational Therapy

## 2015-06-07 MED ORDER — OXYCODONE HCL 5 MG PO TABS
5.0000 mg | ORAL_TABLET | Freq: Four times a day (QID) | ORAL | Status: DC | PRN
  Administered 2015-06-07 – 2015-06-08 (×3): 5 mg via ORAL
  Filled 2015-06-07 (×3): qty 1

## 2015-06-07 NOTE — Progress Notes (Signed)
[facility] PHYSICAL MEDICINE & REHABILITATION     PROGRESS NOTE    Subjective/Complaints:  Back pain, took Percocet at home. Reviewed MRI showed some lumbar spondylosis but no other significant findings, no nerve root compression  ROS: Denies CP, SOB, n/v/d.  Objective: Vital Signs: Blood pressure 112/63, pulse 81, temperature 97.6 F (36.4 C), temperature source Oral, resp. rate 18, weight 98.793 kg (217 lb 12.8 oz), last menstrual period 05/24/2003, SpO2 100 %. No results found. No results for input(s): WBC, HGB, HCT, PLT in the last 72 hours.  Recent Labs  06/05/15 0417  NA 138  K 4.4  CL 102  GLUCOSE 101*  BUN 9  CREATININE 0.81  CALCIUM 8.7*   CBG (last 3)  No results for input(s): GLUCAP in the last 72 hours.  Wt Readings from Last 3 Encounters:  06/06/15 98.793 kg (217 lb 12.8 oz)  05/26/15 108.6 kg (239 lb 6.7 oz)  04/22/15 110.814 kg (244 lb 4.8 oz)   without masses no flank yesterday is dressing recommendation is warm he is also is that she has surgery is we continue to:  Bathing 14 pacemaker on his safety for all his continue office call tomorrow Physical Exam:  BP 112/63 mmHg  Pulse 81  Temp(Src) 97.6 F (36.4 C) (Oral)  Resp 18  Wt 98.793 kg (217 lb 12.8 oz)  SpO2 100%  LMP 05/24/2003 Constitutional: She appears well-developed and well-nourished. Vital signs reviewed.  Obese female.  HENT: Normocephalic and atraumatic. Eyes: Conjunctivae and EOM are normal.  Neck: Normal range of motion. Neck supple.  Cardiovascular: Normal rate and regular rhythm.  Respiratory: Effort normal. No respiratory distress.  GI: Soft. Bowel sounds are normal. She exhibits no distension. There is no tenderness.  Musculoskeletal: Tenderness along cervical thoracic and paraspinal areas Neurological: She is alert.  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 Word finding difficulty remains  Impulsive and lacks insight/awareness of  deficits.  Sensation appears to be diminished RLE Motor: LUE/LLE: Appears to be 4+/5 proximal to distal RUE: Appears to be 4-/5 shoulder abduction, elbow flexion/extension, Trace finger grip RLE: Appears to be 2+/5 proximal to distal  Skin: Skin is warm and dry.  Psychiatric: Her speech is slurred. She expresses impulsivity.   Assessment/Plan: 1. Functional deficits secondary to left MCA infarct which require 3+ hours per day of interdisciplinary therapy in a comprehensive inpatient rehab setting. Physiatrist is providing close team supervision and 24 hour management of active medical problems listed below. Physiatrist and rehab team continue to assess barriers to discharge/monitor patient progress toward functional and medical goals.  Function:  Bathing Bathing position   Position: Shower  Bathing parts Body parts bathed by patient: Right arm, Chest, Abdomen, Front perineal area, Right upper leg, Left upper leg, Buttocks, Left arm, Right lower leg, Left lower leg Body parts bathed by helper: Back  Bathing assist Assist Level: Touching or steadying assistance(Pt > 75%)      Upper Body Dressing/Undressing Upper body dressing   What is the patient wearing?: Pull over shirt/dress     Pull over shirt/dress - Perfomed by patient: Thread/unthread left sleeve, Put head through opening, Thread/unthread right sleeve Pull over shirt/dress - Perfomed by helper: Pull shirt over trunk        Upper body assist Assist Level:  (mod A)      Lower Body Dressing/Undressing Lower body dressing   What is the patient wearing?: Pants, Non-skid slipper socks     Pants- Performed by patient:  Thread/unthread left pants leg, Thread/unthread right pants leg, Pull pants up/down Pants- Performed by helper: Thread/unthread right pants leg, Thread/unthread left pants leg, Pull pants up/down Non-skid slipper socks- Performed by patient: Don/doff right sock, Don/doff left sock                     Lower body assist Assist for lower body dressing: Touching or steadying assistance (Pt > 75%)      Toileting Toileting   Toileting steps completed by patient: Performs perineal hygiene, Adjust clothing prior to toileting, Adjust clothing after toileting Toileting steps completed by helper: Adjust clothing after toileting Toileting Assistive Devices: Grab bar or rail  Toileting assist Assist level: Touching or steadying assistance (Pt.75%)   Transfers Chair/bed transfer   Chair/bed transfer method: Ambulatory Chair/bed transfer assist level: No Help, no cues, assistive device, takes more than a reasonable amount of time       Locomotion Ambulation     Max distance: 150 Assist level: Supervision or verbal cues   Wheelchair Wheelchair activity did not occur: Refused     Assist Level: Dependent (Pt equals 0%)  Cognition Comprehension Comprehension assist level: Understands basic 75 - 89% of the time/ requires cueing 10 - 24% of the time  Expression Expression assist level: Expresses basic 50 - 74% of the time/requires cueing 25 - 49% of the time. Needs to repeat parts of sentences.  Social Interaction Social Interaction assist level: Interacts appropriately 75 - 89% of the time - Needs redirection for appropriate language or to initiate interaction.  Problem Solving Problem solving assist level: Solves basic 50 - 74% of the time/requires cueing 25 - 49% of the time  Memory Memory assist level: Recognizes or recalls 25 - 49% of the time/requires cueing 50 - 75% of the time    Medical Problem List and Plan: 1. Right sided weakness, apraxia, dysarthria secondary to left MCA infarct.Ambulating with min assist this morning with physical therapy.  Continue CIR- plan D/C in am 2. H/o DVT/PE/Anticoagulation: Pharmaceutical: Xarelto 3. Chronic back pain/Pain Management: Discontinue hydrocodone Resume oxycodone. For discharge will give 2-3 wk supply with plans to follow-up with primary  care physician. Will not be prescribing narcotics for this patient on a chronic basis. Add Kpad and sportscreme Need to use judiciously to avoid sedation/respiratory suppression.   4. Mood: Monitor for mood for now--patient lacks insight and awareness or deficits. LCSW to follow along and complete evaluation when appropriate.  5. Neuropsych: This patient is not capable of making decisions on her own behalf. 6. Skin/Wound Care: Routine pressure relief measures 7. Fluids/Electrolytes/Nutrition: Monitor I/O. Offer supplements between meals.      8. HTN: Monitor BP bid. Lasix changed to HCTZ , Avoid hypotension 9. Asthma/COPD: Encourage IS as able. Avoid narcotic and may need to schedule nebs to help manage wheezing/hypoxia.  10. Morbid obesity: Pressure relief measures. Heart Healthy diet.  11. Mild OSA: Question OHS--may have CPAP at home--unknown as patient has been living with daughter for past 3-4 months, will follow up.  12. Depression/ Anxiety disorder: Used Celexa and ativan at home. Resume Celexa.  13. Hypoalbuminemia:  LOS (Days) 8 A FACE TO FACE EVALUATION WAS PERFORMED  Brooke [name] 06/07/2015 9:11 AM

## 2015-06-07 NOTE — Progress Notes (Signed)
Occupational Therapy Session Note  Patient Details  [name] MRN: [id number] Date of Birth: 18-Jan-1956  Today's Date: 06/07/2015 OT Individual Time: 1000-1045 OT Individual Time Calculation (min): 45 min    Short Term Goals: Week 1:  OT Short Term Goal 1 (Week 1): Pt will perform toileting 3/3 steps with steadying A OT Short Term Goal 2 (Week 1): Pt will initiate using her right hand in ADL performance with mod cuing in a 60 min session  OT Short Term Goal 3 (Week 1): Pt will utilize call bell to notifiy  toileting needs with mod A in a 60 min session  Skilled Therapeutic Interventions/Progress Updates:    Pt seen for OT ADL bathing and dressing session. Pt in supine upon arrival, agreeable to tx session. She sat on EOB with VCs for safety awareness when bending over to retrieve clothes from bedside table. She ambulated throughout room with supervision, completing functional transfers in shower and on/off toilet. VCs provided throughout session for safety, including picking items up off floor to avoid trip hazards in bathroom. She dressed seated on toilet with supervision. Grooming completed standing at sink. Pt attempting to use R UE in functional manner and is able to however, demonstrated decreased fine motor coordination, commonly dropping items .  Reviewed with pt fine motor exercises to complete for R UE strengthening/ coordination. Pt's daughter/ caregiver not present for session but will need to assist pt with reviewing fine motor exercises as pt unable to verbalize/ understand written directions on HEP. Pt returned to supine at end of session, left with all needs in place and bed alarm on.   Therapy Documentation Precautions:  Precautions Precautions: Fall Precaution Comments: R sided neglect Restrictions Weight Bearing Restrictions: No Pain: Pain Assessment Pain Score: No/ denies pain  See Function Navigator for Current Functional Status.   Therapy/Group:  Individual Therapy  [name], [name] 06/07/2015, 7:15 AM

## 2015-06-07 NOTE — Consult Note (Signed)
   [facility] Inpatient Consult   06/07/2015  Brooke [name] 1956/05/18 [id number]    Thank you for this consult. Patient evaluated for [place] Management services.  Unfortunately, this patient is Not eligible for [facility] Care Management Services. Her [employer] Medicare is not attributed to [place] population.   Reason:  Not a beneficiary currently attributed to one of the [place].  Membership roster was used to verify non- eligible status.For questions, please contact: Natividad Brood, RN BSN [facility] Liaison  [phone] business mobile phone

## 2015-06-07 NOTE — Progress Notes (Addendum)
Physical Therapy Discharge Summary  Patient Details  [name] MRN: [id number] Date of Birth: Feb 26, 1956  Today's Date: 06/07/2015 PT Individual Time: 0803-0905 PT Individual Time Calculation (min): 62 min    Patient has met 11 of 11 long term goals due to improved activity tolerance, improved balance, improved postural control, increased strength, ability to compensate for deficits, functional use of  right upper extremity and right lower extremity, improved attention and improved awareness.  Patient to discharge at an ambulatory level Supervision.   Patient's care partner is independent to provide the necessary cognitive assistance at discharge.  Reasons goals not met: n/a  Recommendation:  Patient will benefit from ongoing skilled PT services in home health setting to continue to advance safe functional mobility, address ongoing impairments in balance, activity tolerance motor control, and minimize fall risk.  Equipment: No equipment provided  Reasons for discharge: treatment goals met and discharge from hospital  Patient/family agrees with progress made and goals achieved: Yes   tx today: simulated car transfer, gait on level tile in crowded conditions, gait on floor mat to simulate uneven terrain, stairs, up/down ramp and curb/step, retrieving item from floor; neuro re-ed RL on [place] at level 40 cm/sec in sitting and standing focusing on full wt shifting to R. Gait throughout unit without AD; basic transfers modified independent.  Pt left resting EOB with bed alarm on at end of session.  All needs within reach.  PT Discharge Precautions/Restrictions Precautions Precautions: Fall Precaution Comments: R sided inattention Restrictions Weight Bearing Restrictions: No   Pain Pain Assessment Faces Pain Scale: Hurts a little bit Pain Location: Back Pain Intervention(s): Refused Vision/Perception - decreased horizontal tracking and smoothness of movement, per OT  report    Cognition Orientation Level: Oriented to person;Oriented to place;Oriented to situation;Other (comment) (word finding difficulties) Sensation Sensation Light Touch: Appears Intact (RLE) Proprioception: Appears Intact (RLE) Coordination Gross Motor Movements are Fluid and Coordinated: Yes Fine Motor Movements are Fluid and Coordinated: No Heel Shin Test: = bilaterally; pt stated it was uncomfortable bil knees Motor  Motor Motor: Hemiplegia Motor - Discharge Observations: pt describes bil LE muscle spasms at night, due to sciatica PTA  Mobility Bed Mobility Bed Mobility:  (modificed independent for all) Transfers Transfers: Yes Sit to Stand: 6: Modified independent (Device/Increase time) Stand to Sit: 6: Modified independent (Device/Increase time) Stand Pivot Transfers: 6: Modified independent (Device/Increase time) Locomotion  Ambulation Ambulation: Yes Ambulation/Gait Assistance: 5: Supervision Ambulation Distance (Feet): 150 Feet Assistive device: None Gait Gait: Yes Gait Pattern: Impaired Gait Pattern: Step-through pattern;Decreased hip/knee flexion - left;Decreased dorsiflexion - right;Decreased trunk rotation Velocity: 2.05'/sec 10MWT; neighborhood Agricultural engineer / Additional Locomotion Stairs: Yes Stairs Assistance: 5: Supervision Stairs Assistance Details (indicate cue type and reason): self selected step through ascending and step to descending but leading with R foot Stair Management Technique: Two rails Number of Stairs: 12 Height of Stairs: 6 (and some 3") Ramp: 5: Supervision Curb: 5: Building services engineer Mobility: No  Trunk/Postural Assessment  Cervical Assessment Cervical Assessment: Within Functional Limits (head tilt resolved) Thoracic Assessment Thoracic Assessment: Within Functional Limits Lumbar Assessment Lumbar Assessment: Within Functional Limits Postural Control Postural Control: Within Functional Limits   Balance Balance Balance Assessed: Yes Static Sitting Balance Static Sitting - Level of Assistance: 6: Modified independent (Device/Increase time) Dynamic Sitting Balance Dynamic Sitting - Level of Assistance: 5: Stand by assistance Static Standing Balance Static Standing - Level of Assistance: 5: Stand by assistance Dynamic Standing Balance Dynamic Standing - Level  of Assistance: 5: Stand by assistance Extremity Assessment      RLE Assessment RLE Assessment: Exceptions to [facility] RLE Strength RLE Overall Strength Comments: grossly 4/5 hip flexion, knee ext, ankle DF LLE Assessment LLE Assessment: Within Functional Limits   See Function Navigator for Current Functional Status.  Valdemar Mcclenahan 06/07/2015, 11:28 AM

## 2015-06-07 NOTE — Patient Care Conference (Signed)
Inpatient RehabilitationTeam Conference and Plan of Care Update Date: 06/06/2015   Time: 10:50 AM    Patient [name]      Medical Record Number: [id number]  Date of Birth: 1956/05/23 Sex: Female         Room/Bed: 4W12C/4W12C-01 Payor Info: Payor: [employer] MEDICARE / Plan: [place] [employer] / Product Type: *No Product type* /    Admitting Diagnosis: mca infarc  Admit Date/Time:  05/30/2015  5:40 PM Admission Comments: No comment available   Primary Diagnosis:  <principal problem not specified> Principal Problem: <principal problem not specified>  Patient Active Problem List   Diagnosis Date Noted  . Expressive aphasia   . 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 ([place]) 05/30/2015  . Stroke with cerebral ischemia ([place])   . Chronic obstructive pulmonary disease ([place])   . Hx of migraines   . OSA (obstructive sleep apnea)   . Hemiparesis, aphasia, and dysphagia as late effect of cerebrovascular accident (CVA) ([place])   . Elevated troponin 05/28/2015  . Essential hypertension 05/28/2015  . Hyperkalemia 05/28/2015  . History of pulmonary embolism 05/28/2015  . History of DVT (deep vein thrombosis) 05/28/2015  . Global aphasia   . Aphasia   . Weakness   . Stroke ([place]) 05/26/2015  . Acute ischemic stroke ([place]) 05/26/2015  . Altered mental status 04/21/2015  . Acute tonsillitis 06/25/2014  . Acute pulmonary embolism ([place]) 02/24/2014  . Acute deep vein thrombosis (DVT) of other specified vein of right lower extremity ([place]) 02/24/2014  . Respiratory failure ([place]) 02/23/2014  . DVT (deep venous thrombosis) ([place]) 02/16/2014  . DVT femoral (deep venous thrombosis) with thrombophlebitis ([place]) 02/16/2014  . Asthma, chronic 02/08/2014  . Obstructive sleep apnea 02/08/2014  . GERD (gastroesophageal reflux disease) 09/28/2013  . Chest pain 04/19/2013  . Obesity, Class III, BMI 40-49.9 (morbid obesity) ([place]) 08/23/2012  . Constipation 08/20/2012  . Headache(784.0)  08/20/2012  . Chronic pain syndrome 08/19/2012  . Hyperglycemia 08/19/2012  . Insomnia 08/19/2012  . Hypokalemia 10/21/2011  . Tobacco abuse 10/21/2011  . Arthritis 10/21/2011  . Anxiety 10/21/2011  . Depression 10/21/2011  . Esophageal reflux 07/30/2011  . Diarrhea following gastrointestinal surgery 07/30/2011  . PVC (premature ventricular contraction) 07/10/2011  . Morbid obesity ([place])   . Hypertension   . Dyslipidemia   . 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 Alysia Penna Social Worker Present: Alfonse Alpers, LCSW Nurse Present: Dorien Chihuahua, RN PT Present: Georjean Mode, Dustin Folks, PT OT Present: Clyda Greener, OT SLP Present: Windell Moulding, SLP PPS Coordinator present : Daiva Nakayama, RN, CRRN     Current Status/Progress Goal Weekly Team Focus  Medical   pt irritable, refusing modified diet, no sign of aspiration, aphasic, chronic pain  Home with family assist  manage back pain conservatively   Bowel/Bladder   continent of bowel and bladder. LBM 12-27  manage bowel and bladder mod I  contiune POC   Swallow/Nutrition/ Hydration   Upgraded to dys 2, thin liquds   supervision for least restrictive diet; upgraded   diet toleration and trials of advanced consistencies   ADL's   supervision for functional transfers, steady assist for bathing, Min A for UB self care and dressing, Mod A for LB self care and dressing  supervision overall  pt/family education, balance/functional mobility, self care retraining, d/c planning  Mobility   supervision overall  modified independent transfers, supervision gait x 150' and up/down 12 steps 2 rails  activity tolerance, balance, pt and family ed   Communication   min assist for complex receptive language, mod-max assist for expressive language, mod-max  assist for dysarthria   min-mod assist   intelligibility at the word level, verbal expression at the word/phrase level   Safety/Cognition/ Behavioral Observations  moderate deficits, impulsivity, decreased emergent awareness of deficits, decreased sustained attention to tasks  min assist   basic cognition, work towards long term goals     Pain   chronic back pain. 1-2 norco q6 hrs PRN, tyenol 650 PRN q4hrs  4 or less  assess pain and medicate as needed   Skin   skin CDI          Rehab Goals Patient on target to meet rehab goals: Yes Rehab Goals Revised: none *See Care Plan and progress notes for long and short-term goals.  Barriers to Discharge: Needs family training    Possible Resolutions to Barriers:  plan d/c in 2 days    Discharge Planning/Teaching Needs:  Pt plans to return to her dtr's home where family will be with her 24/7 to provide supervision.  Pt's dtr has received family education from the pt's therapists and feels confident in her care at home.   Team Discussion:  Pt with L MCA and R hemiparesis.  Pt has had MRI of back, suspected arthritis with some muscle soreness and chronic deconditioning.  Dtr has been through family education with therapies.  ST has progressed pt to D2 and is hopeful for D3 by d/c.  Pt just needs to slow down with eating.  HH for f/u and no DME needed at home.  Revisions to Treatment Plan:  none   Continued Need for Acute Rehabilitation Level of Care: The patient requires daily medical management by a physician with specialized training in physical medicine and rehabilitation for the following conditions: Daily direction of a multidisciplinary physical rehabilitation program to ensure safe treatment while eliciting the highest outcome that is of practical value to the patient.: Yes Daily medical management of patient stability for increased activity during participation in an intensive rehabilitation regime.: Yes Daily analysis of laboratory  values and/or radiology reports with any subsequent need for medication adjustment of medical intervention for : Neurological problems;Other  Ciarrah Rae, Silvestre Mesi 06/07/2015, 12:48 PM

## 2015-06-07 NOTE — Progress Notes (Signed)
Social Work Patient ID: Brooke [name], female   DOB: [birth year], 59 y.o.   MRN: [id number]   CSW met with pt and dtr to update them on team conference discussion.  Both feel ready for pt to come home.  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  Pt feels prepared and eager to going home.  Pt has a shower seat at her home and dtr will go get it to take to dtr's home where pt is staying.  No other DME is needed.  CSW talked with dtr about preferences in [facility] agency and referral was made to [facility] for RN, PT, OT, and ST.  Handicap placard application to be signed by the MD and given to dtr for her to take to the plate agency.  CSW will continue to follow and assist as needed.

## 2015-06-07 NOTE — Progress Notes (Addendum)
Speech Language Pathology Discharge Summary  Patient Details  [name] MRN: [id number] Date of Birth: [birth year]  Today's Date: 06/07/2015 SLP Individual Time: 4098-1191; 4782-9562 SLP Individual Time Calculation (min): 65 min; 30 min    Skilled Therapeutic Interventions:   Session 1: Pt was seen for skilled ST targeting cognitive goals and dysphagia.  SLP facilitated the session with a novel card game targeting sustained/selective attention to task and functional problem solving.  Initially, pt required max instructional cues to match playing cards that equaled 11 when added together; however, with increased task familiarity pt was able to complete the abovementioned task with min assist verbal cues.  She selectively attended to the abovementioned task in a moderately distracting environment for ~10 minute intervals with overall min assist verbal cues for redirection.  SLP also facilitated the session with a trial meal tray of dys 3 textures to continue working towards diet progression prior to discharge.  Pt consumed advanced solids with overall min assist faded to supervision cues to clear right sided buccal residue and anterior loss of boluses.  No overt s/s of aspiration evident with solids or liquids.  As a result, recommend diet advancement to dys 3 textures and thin liquids with continued full supervision at this time.    Session 2: Pt was seen for skilled ST targeting communication goals.  SLP facilitated the session with a categorical naming task targeting dysarthria and word finding.  Pt was intelligible at the word level in ~80% of opportunities with min assist verbal cues for overarticulation and slow rate.   Pt required consistent min-mod assist verbal cues to generate names of specific category members for ~80% accuracy.  Pt was left sitting at edge of bed with her cousin at bedside.     Patient has met 9 of 9 long term goals.  Patient to discharge at overall Min;Mod  level.  Reasons goals not met:  n/a   Clinical Impression/Discharge Summary:  Pt made excellent progress while inpatient and is discharging having met 9 out of 9 long term goals.  Pt's diet has been advanced to dys 3 textures and thin liquids with min assist for use of swallowing precautions to monitor and correct oral phase deficits (pocketing and anterior labial loss).  Pt is now able to convey her needs and wants to caregivers at the word/short phrase level with min-mod assist verbal cues for intelligibility and word finding.  Pt is also able to follow multi-step semi-complex commands in structured, novel tasks with at most min assist verbal cues.  She requires min-mod assist during cognitive tasks due to decreased selective attention to tasks, decreased emergent awareness of deficits, impulsivity and decreased safety awareness, as well as decreased functional problem solving.  Pt is discharging home with 24/7 supervision from family with recommendations for ST follow up at next level of care to continue to address cognitive-linguistic and swallowing function.  Pt and family education is complete at this time.     Care Partner:  Caregiver Able to Provide Assistance: Yes  Type of Caregiver Assistance: Physical;Cognitive  Recommendation:  Home Health SLP;Outpatient SLP;24 hour supervision/assistance  Rationale for SLP Follow Up: Maximize functional communication;Maximize cognitive function and independence;Maximize swallowing safety;Reduce caregiver burden   Equipment: none recommended by SLP    Reasons for discharge: Discharged from hospital   Patient/Family Agrees with Progress Made and Goals Achieved: Yes   Function:  Eating Eating   Modified Consistency Diet: Yes Eating Assist Level: Set up assist for;Supervision or verbal  cues;Helper checks for pocketed food   Eating Set Up Assist For: Opening containers       Cognition Comprehension Comprehension assist level: Understands basic  90% of the time/cues < 10% of the time  Expression   Expression assist level: Expresses basic 50 - 74% of the time/requires cueing 25 - 49% of the time. Needs to repeat parts of sentences.  Social Interaction Social Interaction assist level: Interacts appropriately 75 - 89% of the time - Needs redirection for appropriate language or to initiate interaction.  Problem Solving Problem solving assist level: Solves basic 50 - 74% of the time/requires cueing 25 - 49% of the time  Memory Memory assist level: Recognizes or recalls 50 - 74% of the time/requires cueing 25 - 49% of the time   Emilio Math 06/07/2015, 4:01 PM

## 2015-06-07 NOTE — Progress Notes (Signed)
Occupational Therapy Discharge Summary  Patient Details  [name] MRN: [id number] Date of Birth: [birth year]  Patient has met 67 of 2 long term goals due to improved activity tolerance, improved balance, postural control, functional use of  RIGHT upper extremity, improved attention, improved awareness and improved coordination.  Patient to discharge at overall Supervision level.  Patient's care partner is independent to provide the necessary physical assistance at discharge.    Recommendation:  Patient will benefit from ongoing skilled OT services in home health setting to continue to advance functional skills in the area of BADL and Reduce care partner burden.  Equipment: No equipment provided  Reasons for discharge: treatment goals met and discharge from hospital  Patient/family agrees with progress made and goals achieved: Yes  OT Discharge Precautions/Restrictions  Precautions Precautions: Fall Precaution Comments: R sided inattention Restrictions Weight Bearing Restrictions: No Vision/Perception  Vision- Assessment Vision Assessment?: Yes Ocular Range of Motion: Within Functional Limits Tracking/Visual Pursuits: Decreased smoothness of horizontal tracking Saccades: Decreased speed of saccadic movement Additional Comments: Pt with difficulty followin directions for formal assessment  Sensation Sensation Light Touch: Appears Intact (RLE) Proprioception: Appears Intact (RLE) Coordination Gross Motor Movements are Fluid and Coordinated: Yes Fine Motor Movements are Fluid and Coordinated: No Heel Shin Test: = bilaterally; pt stated it was uncomfortable bil knees Motor  Motor Motor: Hemiplegia Motor - Discharge Observations: pt describes bil LE muscle spasms at night, due to sciatica PTA Mobility  Bed Mobility Bed Mobility:  (modificed independent for all) Transfers Sit to Stand: 6: Modified independent (Device/Increase time) Stand to Sit: 6: Modified  independent (Device/Increase time)  Trunk/Postural Assessment  Cervical Assessment Cervical Assessment: Within Functional Limits (head tilt resolved) Thoracic Assessment Thoracic Assessment: Within Functional Limits Lumbar Assessment Lumbar Assessment: Within Functional Limits Postural Control Postural Control: Within Functional Limits  Balance Balance Balance Assessed: Yes Static Sitting Balance Static Sitting - Level of Assistance: 6: Modified independent (Device/Increase time) Dynamic Sitting Balance Dynamic Sitting - Level of Assistance: 5: Stand by assistance Static Standing Balance Static Standing - Level of Assistance: 5: Stand by assistance Dynamic Standing Balance Dynamic Standing - Level of Assistance: 5: Stand by assistance Extremity/Trunk Assessment RUE Assessment RUE Assessment: Exceptions to [facility] RUE Strength RUE Overall Strength: Deficits;Due to impaired cognition RUE Overall Strength Comments: 3+/5 overall though difficult to assess due to impaired cognition RUE Tone RUE Tone: Mild LUE Assessment LUE Assessment: Within Functional Limits   See Function Navigator for Current Functional Status.  Bobby Rumpf, Dayna Alia C 06/07/2015, 12:13 PM

## 2015-06-08 MED ORDER — GRX ANALGESIC BALM EX OINT: 1.0000 | TOPICAL_OINTMENT | Freq: Two times a day (BID) | CUTANEOUS | Status: DC | PRN

## 2015-06-08 MED ORDER — GABAPENTIN 300 MG PO CAPS: 300.0000 mg | ORAL_CAPSULE | Freq: Two times a day (BID) | ORAL | Status: DC

## 2015-06-08 MED ORDER — RIVAROXABAN 20 MG PO TABS: 20.0000 mg | ORAL_TABLET | Freq: Every day | ORAL | Status: DC

## 2015-06-08 MED ORDER — CITALOPRAM HYDROBROMIDE 20 MG PO TABS: 20.0000 mg | ORAL_TABLET | Freq: Every day | ORAL | Status: DC

## 2015-06-08 MED ORDER — OXYCODONE HCL 5 MG PO TABS: 5.0000 mg | ORAL_TABLET | Freq: Two times a day (BID) | ORAL | Status: DC | PRN

## 2015-06-08 MED ORDER — ATORVASTATIN CALCIUM 40 MG PO TABS: 40.0000 mg | ORAL_TABLET | Freq: Every day | ORAL | Status: DC

## 2015-06-08 MED ORDER — AMITRIPTYLINE HCL 50 MG PO TABS: 25.0000 mg | ORAL_TABLET | Freq: Every day | ORAL | Status: DC

## 2015-06-08 NOTE — Progress Notes (Signed)
[facility] PHYSICAL MEDICINE & REHABILITATION     PROGRESS NOTE    Subjective/Complaints:  Patient states her sister was in yesterday. She remains aphasic with decreased expressive language. Her daughter will pick her up today. No back pain complaints today  ROS: Denies CP, SOB, n/v/d.  Objective: Vital Signs: Blood pressure 127/68, pulse 79, temperature 97.6 F (36.4 C), temperature source Oral, resp. rate 19, weight 98.793 kg (217 lb 12.8 oz), last menstrual period 05/24/2003, SpO2 97 %. No results found. No results for input(s): WBC, HGB, HCT, PLT in the last 72 hours. No results for input(s): NA, K, CL, GLUCOSE, BUN, CREATININE, CALCIUM in the last 72 hours.  Invalid input(s): CO CBG (last 3)  No results for input(s): GLUCAP in the last 72 hours.  Wt Readings from Last 3 Encounters:  06/06/15 98.793 kg (217 lb 12.8 oz)  05/26/15 108.6 kg (239 lb 6.7 oz)  04/22/15 110.814 kg (244 lb 4.8 oz)   without masses no flank yesterday is dressing recommendation is warm he is also is that she has surgery is we continue to:  Bathing 14 pacemaker on his safety for all his continue office call tomorrow Physical Exam:  BP 127/68 mmHg  Pulse 79  Temp(Src) 97.6 F (36.4 C) (Oral)  Resp 19  Wt 98.793 kg (217 lb 12.8 oz)  SpO2 97%  LMP 05/24/2003 Constitutional: She appears well-developed and well-nourished. Vital signs reviewed.  Obese female.  HENT: Normocephalic and atraumatic. Eyes: Conjunctivae and EOM are normal.  Neck: Normal range of motion. Neck supple.  Cardiovascular: Normal rate and regular rhythm.  Respiratory: Effort normal. No respiratory distress.  GI: Soft. Bowel sounds are normal. She exhibits no distension. There is no tenderness.  Musculoskeletal: Tenderness along cervical thoracic and paraspinal areas Neurological: She is alert.  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 Word finding difficulty remains  Impulsive and lacks  insight/awareness of deficits.  Sensation appears to be diminished RLE Motor: LUE/LLE: Appears to be 4+/5 proximal to distal RUE: Appears to be 4-/5 shoulder abduction, elbow flexion/extension, Trace finger grip RLE: Appears to be 2+/5 proximal to distal  Skin: Skin is warm and dry.  Psychiatric: Her speech is slurred. She expresses impulsivity.   Assessment/Plan: 1. Functional deficits secondary to left MCA infarct  Stable for D/C today F/u PCP in 1-2 weeks F/u PM&R 3 weeks Follow-up neurology 2 months See D/C summary See D/C instructions  Function:  Bathing Bathing position   Position: Shower  Bathing parts Body parts bathed by patient: Right arm, Chest, Abdomen, Front perineal area, Right upper leg, Left upper leg, Buttocks, Left arm, Right lower leg, Left lower leg Body parts bathed by helper: Back  Bathing assist Assist Level: Supervision or verbal cues      Upper Body Dressing/Undressing Upper body dressing   What is the patient wearing?: Pull over shirt/dress     Pull over shirt/dress - Perfomed by patient: Thread/unthread left sleeve, Put head through opening, Thread/unthread right sleeve, Pull shirt over trunk Pull over shirt/dress - Perfomed by helper: Pull shirt over trunk        Upper body assist Assist Level: More than reasonable time      Lower Body Dressing/Undressing Lower body dressing   What is the patient wearing?: Pants, Socks     Pants- Performed by patient: Thread/unthread left pants leg, Thread/unthread right pants leg, Pull pants up/down Pants- Performed by helper: Thread/unthread right pants leg, Thread/unthread left pants leg, Pull pants up/down Non-skid  slipper socks- Performed by patient: Don/doff right sock, Don/doff left sock                    Lower body assist Assist for lower body dressing: Supervision or verbal cues      Toileting Toileting   Toileting steps completed by patient: Adjust clothing prior to toileting,  Performs perineal hygiene, Adjust clothing after toileting Toileting steps completed by helper: Adjust clothing after toileting Toileting Assistive Devices: Grab bar or rail  Toileting assist Assist level: Supervision or verbal cues   Transfers Chair/bed transfer   Chair/bed transfer method: Ambulatory Chair/bed transfer assist level: No Help, no cues, assistive device, takes more than a reasonable amount of time       Locomotion Ambulation     Max distance: 150 Assist level: Supervision or verbal cues   Wheelchair Wheelchair activity did not occur: Refused     Assist Level: Dependent (Pt equals 0%)  Cognition Comprehension Comprehension assist level: Understands basic 90% of the time/cues < 10% of the time  Expression Expression assist level: Expresses basic 50 - 74% of the time/requires cueing 25 - 49% of the time. Needs to repeat parts of sentences.  Social Interaction Social Interaction assist level: Interacts appropriately 75 - 89% of the time - Needs redirection for appropriate language or to initiate interaction.  Problem Solving Problem solving assist level: Solves basic 50 - 74% of the time/requires cueing 25 - 49% of the time  Memory Memory assist level: Recognizes or recalls 50 - 74% of the time/requires cueing 25 - 49% of the time    Medical Problem List and Plan: 1. Right sided weakness, apraxia, dysarthria secondary to left MCA infarct.Ambulating with min assist this morning with physical therapy.   2. H/o DVT/PE/Anticoagulation: Pharmaceutical: Xarelto 3. Chronic back pain/Pain Management: Discontinue hydrocodone Resume oxycodone. For discharge will give 2-3 wk supply with plans to follow-up with primary care physician. Will not be prescribing narcotics for this patient on a chronic basis. Add Kpad and sportscreme Need to use judiciously to avoid sedation/respiratory suppression.   4. Mood: Monitor for mood for now--patient lacks insight and awareness or deficits.  LCSW to follow along and complete evaluation when appropriate.  5. Neuropsych: This patient is not capable of making decisions on her own behalf. 6. Skin/Wound Care: Routine pressure relief measures 7. Fluids/Electrolytes/Nutrition: Monitor I/O. Offer supplements between meals.      8. HTN: Monitor BP bid. Lasix changed to HCTZ , Avoid hypotension 9. Asthma/COPD: Encourage IS as able. Avoid narcotic and may need to schedule nebs to help manage wheezing/hypoxia.  10. Morbid obesity: Pressure relief measures. Heart Healthy diet.  11. Mild OSA: Question OHS--may have CPAP at home--unknown as patient has been living with daughter for past 3-4 months,  12. Depression/ Anxiety disorder: Used Celexa and ativan at home. Resume Celexa.  13. Hypoalbuminemia:  LOS (Days) 9 A FACE TO FACE EVALUATION WAS PERFORMED  Brooke [name] E 06/08/2015 8:29 AM

## 2015-06-08 NOTE — Progress Notes (Signed)
Pt discharged via wheelchair to home with daughter. Pt and daughter state they understand dischg instructions given by [facility] PA and had no questions. All belongings sent home with pt and daughter.

## 2015-06-08 NOTE — Discharge Summary (Signed)
Physician Discharge Summary  Patient ID: Brooke [name] MRN: [id number] DOB/AGE: [birth year] 59 y.o.  Admit date: 05/30/2015 Discharge date: 06/08/15  Discharge Diagnoses:  Principal Problem:   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 [facility]) Active Problems:   Morbid obesity (HCC)   Dyslipidemia   Esophageal reflux   Tobacco abuse   Anxiety   Depression   Chronic pain syndrome   Obesity, Class III, BMI 40-49.9 (morbid obesity) (HCC)   GERD (gastroesophageal reflux disease)   Obstructive sleep apnea   Essential hypertension   Global aphasia   Hemiparesis, aphasia, and dysphagia as late effect of cerebrovascular accident (CVA) [facility])   Discharged Condition: stable     Labs:  Basic Metabolic Panel: BMP Latest Ref Rng 06/09/2015 06/09/2015 06/05/2015  Glucose 65 - 99 mg/dL 95 97 101(H)  BUN 6 - 20 mg/dL 6 5(L) 9  Creatinine 0.44 - 1.00 mg/dL 0.80 0.77 0.81  Sodium 135 - 145 mmol/L 140 139 138  Potassium 3.5 - 5.1 mmol/L 4.3 4.4 4.4  Chloride 101 - 111 mmol/L 100(L) 102 102  CO2 22 - 32 mmol/L - 29 30  Calcium 8.9 - 10.3 mg/dL - 9.3 8.7(L)     CBC: CBC Latest Ref Rng 06/09/2015 06/09/2015 05/31/2015  WBC 4.0 - 10.5 K/uL - 5.6 5.4  Hemoglobin 12.0 - 15.0 g/dL 17.3(H) 13.8 13.4  Hematocrit 36.0 - 46.0 % 51.0(H) 43.0 41.2  Platelets 150 - 400 K/uL - 182 165     CBG: No results for input(s): GLUCAP in the last 168 hours.  Brief HPI:   Brooke [name] is a 59 y.o. female with history of HTN, COPD, Colon CA, Migraines, OSA, morbid obesity, DVT/PE on coumadin who was admitted on 05/26/15 after being found by family with confusion, lethargy and difficulty speaking on 05/26/15.  MRI/MRA brain revealed large L-MCA territory infarct with moderate to severe focal stenosis within more lateral L-M2 branch. INR 1.61 at admission and she was started on IV heparin for treatment. 2 D echo with EF 50-55%, mild aortic stenosis, trivial MR and no wall abnormality. She  developed chest pain with elevated troponin and cardiology consulted for input. Dr. Mare Ferrari felt elevated troponin could bed due to stroke v/s ACS and CP appeared musculoskeletal in nature per exam-doubt PE.  Patient with resultant right inattention, right sided weakness/apraxia, global aphasia affecting ability to follow commands, swallowing, insight into deficits as well as safety with mobility. CIR recommended for follow up therapy and patient cleared medically for admission.    Hospital Course: Brooke [name] was admitted to rehab 05/30/2015 for inpatient therapies to consist of PT, ST and OT at least three hours five days a week. Past admission physiatrist, therapy team and rehab RN have worked together to provide customized collaborative inpatient rehab. Blood pressures were monitored on bid basis and has been reasonably controlled. Follow up labs done revealing hypokalemia and this has resolved with brief supplementation. Po intake improved with advancement of diet to dysphagia 3. Protein supplement was added to help with low protein stores.   Respiratory status has been stable and patient/family was educated on tobacco cessation.  Daughter was advised to search for home CPAP machine and follow up with PMD for repeat study if machine was lost. She is continent of bowel and bladder.  Celexa was resumed and team has provided ego support to help with adjustment issues. She did report increase in back pain as activity level increased. Oxycodone was resumed and  Neurontin  was increased to bid to help with neuropathic symptoms.  K pad as well as sportscreme were used additional to help with local symptoms. Mood has been stable and she has had improvement in ability to express simple needs with min to mod cues. She is currently at supervision level and will continue to receive follow up [place],  [place], [place], [place] by [place] after discharge.    Rehab course: During patient's stay in rehab weekly  team conferences were held to monitor patient's progress, set goals and discuss barriers to discharge. At admission, patient required min guard assist with mobility and min to moderate assistance with ADL tasks. She had moderate global aphasia with severe dysarthria and significant cognitive deficits impacting safety, awareness and all higher level cognitive tasks. She has had improvement in activity tolerance, balance, postural control, as well as ability to compensate for deficits. She is has had improvement in functional use RUE  and RLE as well as improved awareness. She is able to complete ADL tasks with supervision. She is modified independent for transfers and is ambulating 150' with supervision/no AD. She is able to climb 12 stairs with two rails and supervision. She is tolerating dysphagia 3, thin liquids with min assist to utilize safe swallow strategies. She is able to express needs/wants at word/short phrase level with min to moderate verbal cues for intelligibility and word finding. She requires min to moderate assist for cognitive tasks.     Disposition: 01-Home or Self Care  Diet: Soft foods. Needs supervision with meals.   Special Instructions: 1.  Call Dr. Jarold Song for refills on all medications.  2. [facility] to provide PT, OT, ST and RN for follow up therapy.    Discharge Instructions    Ambulatory referral to Physical Medicine Rehab    Complete by:  As directed   Needs post stroke follow up in 3-4 weeks.            Medication List    STOP taking these medications        antiseptic oral rinse 0.05 % Liqd solution  Commonly known as:  CPC / CETYLPYRIDINIUM CHLORIDE 0.05%     chlorhexidine 0.12 % solution  Commonly known as:  PERIDEX     hydrochlorothiazide 12.5 MG tablet  Commonly known as:  HYDRODIURIL     oxyCODONE-acetaminophen 7.5-325 MG tablet  Commonly known as:  PERCOCET     potassium chloride SA 20 MEQ tablet  Commonly known as:  K-DUR,KLOR-CON       TAKE these medications        acetaminophen 500 MG tablet  Commonly known as:  TYLENOL  Take 500 mg by mouth every 6 (six) hours as needed for headache.     albuterol 108 (90 Base) MCG/ACT inhaler  Commonly known as:  PROVENTIL HFA;VENTOLIN HFA  Inhale 2 puffs into the lungs every 6 (six) hours as needed for wheezing or shortness of breath (shortness of breath).     amitriptyline 50 MG tablet  Commonly known as:  ELAVIL  Take 0.5 tablets (25 mg total) by mouth at bedtime.     atorvastatin 40 MG tablet  Commonly known as:  LIPITOR  Take 1 tablet (40 mg total) by mouth daily at 6 PM.     citalopram 20 MG tablet  Commonly known as:  CELEXA  Take 1 tablet (20 mg total) by mouth daily.     gabapentin 300 MG capsule  Commonly known as:  NEURONTIN  Take  1 capsule (300 mg total) by mouth 2 (two) times daily.     GRX ANALGESIC BALM Oint  Apply 1 application topically 2 (two) times daily as needed.     ipratropium-albuterol 0.5-2.5 (3) MG/3ML Soln  Commonly known as:  DUONEB  Take 3 mLs by nebulization every 6 (six) hours.     LORazepam 1 MG tablet  Commonly known as:  ATIVAN  Take 1 tablet (1 mg total) by mouth every 6 (six) hours as needed for anxiety (anxiety).     oxyCODONE 5 MG immediate release tablet--Rx #30 pills  Commonly known as:  Oxy IR/ROXICODONE  Take 1 tablet (5 mg total) by mouth every 12 (twelve) hours as needed for severe pain.     pantoprazole 40 MG tablet  Commonly known as:  PROTONIX  Take 1 tablet (40 mg total) by mouth daily.     rivaroxaban 20 MG Tabs tablet  Commonly known as:  XARELTO  Take 1 tablet (20 mg total) by mouth daily with supper.     VITAMIN-B COMPLEX PO  Take 1 tablet by mouth daily.       Follow-up Information    Follow up with Arnoldo Morale, MD On 06/18/2015.   Specialty:  Family Medicine   Why:  @ 10 AM   Contact information:   [place] [address] [phone]       Follow up with Charlett Blake,  MD.   Specialty:  Physical Medicine and Rehabilitation   Why:  office to call you with appointment   Contact information:   [address] [place] [address] [phone]       Follow up with Antony Contras, MD. Call today.   Specialties:  Neurology, Radiology   Why:  for follow up appointment in 4 weeks.    Contact information:   [address] [place] [address] [phone]       Signed: Bary Leriche 06/18/2015, 3:46 PM

## 2015-06-08 NOTE — Progress Notes (Signed)
Patient give Rx for oxycodone 5 mg # 30. PT/family Advised to call PMD for refills on all medications.

## 2015-06-08 NOTE — Progress Notes (Signed)
Social Work Discharge Note  The overall goal for the admission was met for:   Discharge location: Yes - home with her dtr  Length of Stay: Yes - 9 days  Discharge activity level: Yes - supervision  Home/community participation: Yes  Services provided included: MD, RD, PT, OT, SLP, RN, Pharmacy, [facility]: Private Insurance: [employer] [employer]  Follow-up services arranged: Home Health: RN/PT/OT/ST and Patient/Family request agency HH: [place], DME: none needed  Comments (or additional information):  Pt to go home with her dtr and other extended family.  HH to follow for therapies and pt had a shower seat already at home.  She does not need any other DME.  Dtr to assure pt has 24/7 supervision, even when she's at work.  PCP f/u appt made and dtr to make other f/u appts that work with her schedule.  Pt/dtr pleased pt is going home.  Patient/Family verbalized understanding of follow-up arrangements: Yes  Individual responsible for coordination of the follow-up plan: pt with her family   Confirmed correct DME delivered: Trey Sailors 06/08/2015    Nichollas Perusse, Silvestre Mesi

## 2015-06-09 ENCOUNTER — Emergency Department (HOSPITAL_COMMUNITY)
Admission: EM | Admit: 2015-06-09 | Discharge: 2015-06-09 | Disposition: A | Discharge status: Home / Self Care | Payer: Medicare HMO | Attending: Emergency Medicine | Admitting: Emergency Medicine

## 2015-06-09 ENCOUNTER — Emergency Department (HOSPITAL_COMMUNITY): Payer: Medicare HMO

## 2015-06-09 ENCOUNTER — Encounter (HOSPITAL_COMMUNITY): Payer: Self-pay

## 2015-06-09 DIAGNOSIS — K219 Gastro-esophageal reflux disease without esophagitis: Secondary | ICD-10-CM | POA: Diagnosis not present

## 2015-06-09 DIAGNOSIS — G43909 Migraine, unspecified, not intractable, without status migrainosus: Secondary | ICD-10-CM | POA: Diagnosis not present

## 2015-06-09 DIAGNOSIS — G8929 Other chronic pain: Secondary | ICD-10-CM | POA: Insufficient documentation

## 2015-06-09 DIAGNOSIS — Z85038 Personal history of other malignant neoplasm of large intestine: Secondary | ICD-10-CM | POA: Insufficient documentation

## 2015-06-09 DIAGNOSIS — R29810 Facial weakness: Secondary | ICD-10-CM | POA: Diagnosis present

## 2015-06-09 DIAGNOSIS — R4701 Aphasia: Secondary | ICD-10-CM | POA: Diagnosis not present

## 2015-06-09 DIAGNOSIS — J449 Chronic obstructive pulmonary disease, unspecified: Secondary | ICD-10-CM | POA: Diagnosis not present

## 2015-06-09 DIAGNOSIS — Z86018 Personal history of other benign neoplasm: Secondary | ICD-10-CM | POA: Diagnosis not present

## 2015-06-09 DIAGNOSIS — Z7901 Long term (current) use of anticoagulants: Secondary | ICD-10-CM | POA: Insufficient documentation

## 2015-06-09 DIAGNOSIS — F419 Anxiety disorder, unspecified: Secondary | ICD-10-CM | POA: Diagnosis not present

## 2015-06-09 DIAGNOSIS — I639 Cerebral infarction, unspecified: Secondary | ICD-10-CM | POA: Insufficient documentation

## 2015-06-09 DIAGNOSIS — F329 Major depressive disorder, single episode, unspecified: Secondary | ICD-10-CM | POA: Diagnosis not present

## 2015-06-09 DIAGNOSIS — M1990 Unspecified osteoarthritis, unspecified site: Secondary | ICD-10-CM | POA: Insufficient documentation

## 2015-06-09 DIAGNOSIS — I10 Essential (primary) hypertension: Secondary | ICD-10-CM | POA: Diagnosis not present

## 2015-06-09 DIAGNOSIS — F17210 Nicotine dependence, cigarettes, uncomplicated: Secondary | ICD-10-CM | POA: Diagnosis not present

## 2015-06-09 DIAGNOSIS — Z8673 Personal history of transient ischemic attack (TIA), and cerebral infarction without residual deficits: Secondary | ICD-10-CM | POA: Diagnosis not present

## 2015-06-09 DIAGNOSIS — Z86718 Personal history of other venous thrombosis and embolism: Secondary | ICD-10-CM | POA: Diagnosis not present

## 2015-06-09 DIAGNOSIS — G40909 Epilepsy, unspecified, not intractable, without status epilepticus: Secondary | ICD-10-CM | POA: Diagnosis not present

## 2015-06-09 DIAGNOSIS — R569 Unspecified convulsions: Secondary | ICD-10-CM | POA: Insufficient documentation

## 2015-06-09 DIAGNOSIS — Z86711 Personal history of pulmonary embolism: Secondary | ICD-10-CM | POA: Insufficient documentation

## 2015-06-09 DIAGNOSIS — E785 Hyperlipidemia, unspecified: Secondary | ICD-10-CM | POA: Diagnosis not present

## 2015-06-09 LAB — CBC
HCT: 43 % (ref 36.0–46.0)
Hemoglobin: 13.8 g/dL (ref 12.0–15.0)
MCH: 32.1 pg (ref 26.0–34.0)
MCHC: 32.1 g/dL (ref 30.0–36.0)
MCV: 100 fL (ref 78.0–100.0)
Platelets: 182 10*3/uL (ref 150–400)
RBC: 4.3 MIL/uL (ref 3.87–5.11)
RDW: 13.7 % (ref 11.5–15.5)
WBC: 5.6 10*3/uL (ref 4.0–10.5)

## 2015-06-09 LAB — COMPREHENSIVE METABOLIC PANEL
ALT: 16 U/L (ref 14–54)
AST: 26 U/L (ref 15–41)
Albumin: 3.3 g/dL — ABNORMAL LOW (ref 3.5–5.0)
Alkaline Phosphatase: 74 U/L (ref 38–126)
Anion gap: 8 (ref 5–15)
BUN: 5 mg/dL — ABNORMAL LOW (ref 6–20)
CO2: 29 mmol/L (ref 22–32)
Calcium: 9.3 mg/dL (ref 8.9–10.3)
Chloride: 102 mmol/L (ref 101–111)
Creatinine, Ser: 0.77 mg/dL (ref 0.44–1.00)
GFR calc Af Amer: >60 mL/min (ref >60)
GFR calc non Af Amer: >60 mL/min (ref >60)
Glucose, Bld: 97 mg/dL (ref 65–99)
Potassium: 4.4 mmol/L (ref 3.5–5.1)
Sodium: 139 mmol/L (ref 135–145)
Total Bilirubin: 0.8 mg/dL (ref 0.3–1.2)
Total Protein: 6.6 g/dL (ref 6.5–8.1)

## 2015-06-09 LAB — I-STAT CHEM 8, ED
BUN: 6 mg/dL (ref 6–20)
Calcium, Ion: 1.16 mmol/L (ref 1.12–1.23)
Chloride: 100 mmol/L — ABNORMAL LOW (ref 101–111)
Creatinine, Ser: 0.8 mg/dL (ref 0.44–1.00)
Glucose, Bld: 95 mg/dL (ref 65–99)
HCT: 51 % — ABNORMAL HIGH (ref 36.0–46.0)
Hemoglobin: 17.3 g/dL — ABNORMAL HIGH (ref 12.0–15.0)
Potassium: 4.3 mmol/L (ref 3.5–5.1)
Sodium: 140 mmol/L (ref 135–145)
TCO2: 31 mmol/L (ref 0–100)

## 2015-06-09 LAB — DIFFERENTIAL
Basophils Absolute: 0 10*3/uL (ref 0.0–0.1)
Basophils Relative: 0 %
Eosinophils Absolute: 0.2 10*3/uL (ref 0.0–0.7)
Eosinophils Relative: 3 %
Lymphocytes Relative: 45 %
Lymphs Abs: 2.6 10*3/uL (ref 0.7–4.0)
Monocytes Absolute: 0.4 10*3/uL (ref 0.1–1.0)
Monocytes Relative: 7 %
Neutro Abs: 2.5 10*3/uL (ref 1.7–7.7)
Neutrophils Relative %: 45 %

## 2015-06-09 LAB — I-STAT TROPONIN, ED: Troponin i, poc: 0 ng/mL (ref 0.00–0.08)

## 2015-06-09 LAB — PROTIME-INR
INR: 1.24 (ref 0.00–1.49)
Prothrombin Time: 15.8 seconds — ABNORMAL HIGH (ref 11.6–15.2)

## 2015-06-09 LAB — CBG MONITORING, ED: Glucose-Capillary: 83 mg/dL (ref 65–99)

## 2015-06-09 LAB — APTT: aPTT: 33 seconds (ref 24–37)

## 2015-06-09 MED ORDER — GABAPENTIN 300 MG PO CAPS
300.0000 mg | ORAL_CAPSULE | Freq: Once | ORAL | Status: AC
Start: 2015-06-09 — End: 2015-06-09
  Administered 2015-06-09: 300 mg via ORAL
  Filled 2015-06-09: qty 1

## 2015-06-09 MED ORDER — SODIUM CHLORIDE 0.9 % IV SOLN
1000.0000 mg | Freq: Once | INTRAVENOUS | Status: AC
  Administered 2015-06-09: 1000 mg via INTRAVENOUS
  Filled 2015-06-09: qty 10

## 2015-06-09 MED ORDER — LEVETIRACETAM 500 MG PO TABS: 500.0000 mg | ORAL_TABLET | Freq: Two times a day (BID) | ORAL | Status: DC

## 2015-06-09 MED ORDER — HYDROCHLOROTHIAZIDE 12.5 MG PO CAPS
12.5000 mg | ORAL_CAPSULE | Freq: Every day | ORAL | Status: DC
  Administered 2015-06-09: 12.5 mg via ORAL
  Filled 2015-06-09: qty 1

## 2015-06-09 NOTE — ED Notes (Signed)
Pt. BIB [facility] as code stroke with complaint of slurred speech. Pt. Lives at home with family, pt. Was at baseline this AM. Pt. Daughter noticed slurred speech and facial twitching this AM at 0845. Pt. Has residual R hand weakness and R leg weakness from CVA 2 weeks ago. Pt. Is receiving therapy for symptoms. Pt. On xarelto.

## 2015-06-09 NOTE — ED Provider Notes (Signed)
CSN: [id number]     Arrival date & time 06/09/15  [id number] History   First MD Initiated Contact with Patient 06/09/15 [phone]     Chief Complaint  Patient presents with  . Code Stroke     (Consider location/radiation/quality/duration/timing/severity/associated sxs/prior Treatment) HPI   59yF brought in by EMS as Code Stroke. Recent admission 12/17-12/21 with sudden onset of confusion, lethargy, and aphasia. On exam had partial receptive but complete expressive aphasia, right arm and right facial weakness. CT scan question early ischemia in the distribution of the left MCA.  MRI images showing an acute infarct involving the posterior aspect of the left MCA territory involving the left insular cortex, posterior left frontal lobe, left parietal lobe, and superior left temporal lobe. She was discharged to rehab 12/21 and went home yesterday with family. This morning had facial twitching and then confusion/weakness. Now currently better.   Past Medical History  Diagnosis Date  . Hypertension   . Asthma   . COPD (chronic obstructive pulmonary disease) ([place])   . Anxiety   . Colon cancer ([place])     a. s/p surgery, chemotherapy.  . Dyslipidemia   . Migraine headache   . Chronic bronchitis   . Uterine fibroid   . Ovarian cyst   . GERD (gastroesophageal reflux disease)   . Morbid obesity ([place])   . Obstructive sleep apnea     mild  . Chronic pain   . Chronic back pain   . Arthritis   . Osteoarthritis   . Depression   . DVT (deep venous thrombosis) ([place]) 02/2014  . 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 PCCM recommended lifelong anticoagulation.  . Tobacco abuse   . PVC's (premature ventricular contractions) 2013  . Aortic stenosis     a. mild by echo 05/2015.   Past Surgical History  Procedure Laterality Date  . Knee arthroscopy      bil.  . Carpal tunnel release      rt  . Mouth surgery      teeth extraction  . Cesarean section    . Subtotal colectomy    . Colonoscopy     . Tubal ligation     Family History  Problem Relation Age of Onset  . Uterine cancer Mother   . Stroke Father   . Colon cancer Maternal Uncle   . Diabetes Father   . Ovarian cancer Mother   . Hypertension Father   . Breast cancer Maternal Grandmother   . Diabetes Sister   . Asthma Child   . Asthma Child    Social History  Substance Use Topics  . Smoking status: Current Some Day Smoker -- 1.00 packs/day for 37 years    Types: Cigarettes  . Smokeless tobacco: Never Used     Comment: trying to quit, down to .5ppd X2 years ago.   . Alcohol Use: No   OB History    Gravida Para Term Preterm AB TAB SAB Ectopic Multiple Living   2 2 2       2      Review of Systems  All systems reviewed and negative, other than as noted in HPI.   Allergies  Kiwi extract and Aspirin  Home Medications   Prior to Admission medications   Medication Sig Start Date End Date Taking? Authorizing Provider  acetaminophen (TYLENOL) 500 MG tablet Take 500 mg by mouth every 6 (six) hours as needed for headache.    Historical Provider, MD  albuterol (PROVENTIL HFA;VENTOLIN  HFA) 108 (90 BASE) MCG/ACT inhaler Inhale 2 puffs into the lungs every 6 (six) hours as needed for wheezing or shortness of breath (shortness of breath). 02/20/14   Oswald Hillock, MD  amitriptyline (ELAVIL) 50 MG tablet Take 0.5 tablets (25 mg total) by mouth at bedtime. 06/08/15   Bary Leriche, PA-C  atorvastatin (LIPITOR) 40 MG tablet Take 1 tablet (40 mg total) by mouth daily at 6 PM. 06/08/15   Ivan Anchors Love, PA-C  B Complex Vitamins (VITAMIN-B COMPLEX PO) Take 1 tablet by mouth daily.    Historical Provider, MD  citalopram (CELEXA) 20 MG tablet Take 1 tablet (20 mg total) by mouth daily. 06/08/15   Bary Leriche, PA-C  gabapentin (NEURONTIN) 300 MG capsule Take 1 capsule (300 mg total) by mouth 2 (two) times daily. 06/08/15   Ivan Anchors Love, PA-C  ipratropium-albuterol (DUONEB) 0.5-2.5 (3) MG/3ML SOLN Take 3 mLs by nebulization every 6  (six) hours. 04/23/15   Theodis Blaze, MD  LORazepam (ATIVAN) 1 MG tablet Take 1 tablet (1 mg total) by mouth every 6 (six) hours as needed for anxiety (anxiety). 08/25/14   Lorayne Marek, MD  Menthol-Methyl Salicylate (GRX ANALGESIC BALM) OINT Apply 1 application topically 2 (two) times daily as needed. 06/08/15   Bary Leriche, PA-C  oxyCODONE (OXY IR/ROXICODONE) 5 MG immediate release tablet Take 1 tablet (5 mg total) by mouth every 12 (twelve) hours as needed for severe pain. 06/08/15   Bary Leriche, PA-C  pantoprazole (PROTONIX) 40 MG tablet Take 1 tablet (40 mg total) by mouth daily. 05/30/15   Alexa Sherral Hammers, MD  rivaroxaban (XARELTO) 20 MG TABS tablet Take 1 tablet (20 mg total) by mouth daily with supper. 06/08/15   Bary Leriche, PA-C   SpO2 98%  LMP 05/24/2003 Physical Exam  Constitutional: She appears well-developed and well-nourished. No distress.  HENT:  Head: Normocephalic and atraumatic.  Eyes: Conjunctivae are normal. Right eye exhibits no discharge. Left eye exhibits no discharge.  Neck: Neck supple.  Cardiovascular: Normal rate, regular rhythm and normal heart sounds.  Exam reveals no gallop and no friction rub.   No murmur heard. Pulmonary/Chest: Effort normal and breath sounds normal. No respiratory distress.  Abdominal: Soft. She exhibits no distension. There is no tenderness.  Musculoskeletal: She exhibits no edema or tenderness.  Neurological: She is alert.  Mild dysarthria. Answers appropriate. 4 out of 5 strength right upper and right lower extremity. Normal left. Mild right-sided facial droop. Cranial nerves are otherwise intact bilaterally.  Skin: Skin is warm and dry.  Psychiatric: She has a normal mood and affect. Her behavior is normal. Thought content normal.  Nursing note and vitals reviewed.   ED Course  Procedures (including critical care time) Labs Review Labs Reviewed  PROTIME-INR - Abnormal; Notable for the following:    Prothrombin Time 15.8  (*)    All other components within normal limits  COMPREHENSIVE METABOLIC PANEL - Abnormal; Notable for the following:    BUN 5 (*)    Albumin 3.3 (*)    All other components within normal limits  I-STAT CHEM 8, ED - Abnormal; Notable for the following:    Chloride 100 (*)    Hemoglobin 17.3 (*)    HCT 51.0 (*)    All other components within normal limits  APTT  CBC  DIFFERENTIAL  I-STAT TROPOININ, ED  CBG MONITORING, ED    Imaging Review No results found.   Ct Head Wo Contrast  06/09/2015  CLINICAL DATA:  Sudden onset of slurred speech. Suspected focal seizure. EXAM: CT HEAD WITHOUT CONTRAST TECHNIQUE: Contiguous axial images were obtained from the base of the skull through the vertex without intravenous contrast. COMPARISON:  Multiple prior studies including CT head 05/26/15, CT perfusion and CT angio head 12/17, and MR brain 05/27/15. FINDINGS: LEFT hemisphere Gyriform hyperattenuation over the insula, frontal greater than temporal operculum, and parietal lobe corresponds to the area of restricted diffusion on prior MR, and is favored to represent luxury perfusion within an area of subacute ischemia. No lobar hematoma is observed and there is no subarachnoid blood. No CT signs of large vessel occlusion despite severe LEFT MCA M2 stenosis detected previously. No midline shift. No abnormal findings on the RIGHT or in the posterior fossa. Calvarium intact. No sinus or mastoid disease. IMPRESSION: LEFT hemisphere gyriform hyperattenuation favored to represent luxury perfusion. This corresponds to the area previously documented ischemia, without areas of definite new infarction. Critical Value/emergent results were called by telephone at the time of interpretation on 06/09/2015 at 9:49 am to Dr. Nicole Kindred, who verbally acknowledged these results. Electronically Signed   By: Staci Righter M.D.   On: 06/09/2015 09:58  I have personally reviewed and evaluated these images and lab results as part of  my medical decision-making.   EKG Interpretation None      MDM   Final diagnoses:  Seizure ([place])    59 year old female with recent admission and discharge her stroke. Her symptoms are now back to what she was having upon discharge. Evaluated by neurology. Felt symptoms today was actually a seizure. Advised putting on Keppra. Will be discharged with continued Keppra. Outpatient neurology follow-up.    Virgel Manifold, MD 06/15/15 [phone]

## 2015-06-09 NOTE — Discharge Instructions (Signed)

## 2015-06-09 NOTE — Consult Note (Signed)
Admission H&P    Chief Complaint: Seizure, new onset.  HPI: Brooke [name] is an 59 y.o. female with a history of left MCA stroke 2 weeks ago and discharged from rehabilitation yesterday, DVT, colon cancer, hypertension and COPD brought to the emergency room on acute onset of inability to speak as well as inability to understand what was being said to her. He as well as family members noted right facial twitching. There was no loss of consciousness. She did not experience any increasing weakness and numbness involving right extremities. Twitching of the face resolved her to arriving in the emergency room, and speech returned to baseline after ED arrival. CT scan of her head showed left gyriform hyperattenuation, most likely luxury reperfusion in the area of previously documented ischemia in the left hemisphere. No evidence of new infarction seen. Patient is on anticoagulation with Xaralto. NIH stroke score was 7. Onset of symptoms was at 8:45 AM today.   Past Medical History  Diagnosis Date  . Hypertension   . Asthma   . COPD (chronic obstructive pulmonary disease) ([place])   . Anxiety   . Colon cancer ([place])     a. s/p surgery, chemotherapy.  . Dyslipidemia   . Migraine headache   . Chronic bronchitis   . Uterine fibroid   . Ovarian cyst   . GERD (gastroesophageal reflux disease)   . Morbid obesity ([place])   . Obstructive sleep apnea     mild  . Chronic pain   . Chronic back pain   . Arthritis   . Osteoarthritis   . Depression   . DVT (deep venous thrombosis) ([place]) 02/2014  . 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 PCCM recommended lifelong anticoagulation.  . Tobacco abuse   . PVC's (premature ventricular contractions) 2013  . Aortic stenosis     a. mild by echo 05/2015.    Past Surgical History  Procedure Laterality Date  . Knee arthroscopy      bil.  . Carpal tunnel release      rt  . Mouth surgery      teeth extraction  . Cesarean section    . Subtotal colectomy     . Colonoscopy    . Tubal ligation      Family History  Problem Relation Age of Onset  . Uterine cancer Mother   . Stroke Father   . Colon cancer Maternal Uncle   . Diabetes Father   . Ovarian cancer Mother   . Hypertension Father   . Breast cancer Maternal Grandmother   . Diabetes Sister   . Asthma Child   . Asthma Child    Social History:  reports that she has been smoking Cigarettes.  She has a 37 pack-year smoking history. She has never used smokeless tobacco. She reports that she does not drink alcohol or use illicit drugs.  Allergies:  Allergies  Allergen Reactions  . Kiwi Extract Anaphylaxis, Hives and Swelling    Makes her swell all over   . Aspirin Nausea Only    Can take as long as enteric coated   Medications: Patient's preadmission medications were reviewed by me.  ROS: History obtained from patient as well as 3 of her grown children.  General ROS: negative for - chills, fatigue, fever, night sweats, weight gain or weight loss Psychological ROS: negative for - behavioral disorder, hallucinations, memory difficulties, mood swings or suicidal ideation Ophthalmic ROS: negative for - blurry vision, double vision, eye pain or loss  of vision ENT ROS: negative for - epistaxis, nasal discharge, oral lesions, sore throat, tinnitus or vertigo Allergy and Immunology ROS: negative for - hives or itchy/watery eyes Hematological and Lymphatic ROS: negative for - bleeding problems, bruising or swollen lymph nodes Endocrine ROS: negative for - galactorrhea, hair pattern changes, polydipsia/polyuria or temperature intolerance Respiratory ROS: negative for - cough, hemoptysis, shortness of breath or wheezing Cardiovascular ROS: negative for - chest pain, dyspnea on exertion, edema or irregular heartbeat Gastrointestinal ROS: negative for - abdominal pain, diarrhea, hematemesis, nausea/vomiting or stool incontinence Genito-Urinary ROS: negative for - dysuria, hematuria,  incontinence or urinary frequency/urgency Musculoskeletal ROS: negative for - joint swelling or muscular weakness Neurological ROS: as noted in HPI Dermatological ROS: negative for rash and skin lesion changes  Physical Examination: Last menstrual period 05/24/2003, SpO2 98 %.  HEENT-  Normocephalic, no lesions, without obvious abnormality.  Normal external eye and conjunctiva.  Normal TM's bilaterally.  Normal auditory canals and external ears. Normal external nose, mucus membranes and septum.  Normal pharynx. Neck supple with no masses, nodes, nodules or enlargement. Cardiovascular - regular rate and rhythm, S1, S2 normal, no murmur, click, rub or gallop Lungs - chest clear, no wheezing, rales, normal symmetric air entry Abdomen - soft, non-tender; bowel sounds normal; no masses,  no organomegaly Extremities - no joint deformities, effusion, or inflammation and no edema  Neurologic Examination: Mental Status: Alert, oriented, thought content appropriate.  Speech slightly slurred with mild to moderate expressive aphasia. Able to follow commands without difficulty. Cranial Nerves: II-Visual fields were normal. III/IV/VI-Pupils were equal and reacted normally to light. Extraocular movements were full and conjugate.    V/VII-no facial numbness; minimal right lower facial weakness. VIII-normal. X-mild dysarthria. XI: trapezius strength/neck flexion strength normal bilaterally XII-midline tongue extension with normal strength. Motor: Mild drift of right upper and lower extremities; motor exam otherwise unremarkable.  Sensory: Normal throughout. Deep Tendon Reflexes: 1+ and symmetric. Plantars: Mute bilaterally Cerebellar: Slightly impaired coordination of right upper extremity compared to left. Carotid auscultation: Normal  Results for orders placed or performed during the hospital encounter of 06/09/15 (from the past 48 hour(s))  Protime-INR     Status: Abnormal   Collection Time:  06/09/15  9:31 AM  Result Value Ref Range   Prothrombin Time 15.8 (H) 11.6 - 15.2 seconds   INR 1.24 0.00 - 1.49  APTT     Status: None   Collection Time: 06/09/15  9:31 AM  Result Value Ref Range   aPTT 33 24 - 37 seconds  CBC     Status: None   Collection Time: 06/09/15  9:31 AM  Result Value Ref Range   WBC 5.6 4.0 - 10.5 K/uL   RBC 4.30 3.87 - 5.11 MIL/uL   Hemoglobin 13.8 12.0 - 15.0 g/dL   HCT 43.0 36.0 - 46.0 %   MCV 100.0 78.0 - 100.0 fL   MCH 32.1 26.0 - 34.0 pg   MCHC 32.1 30.0 - 36.0 g/dL   RDW 13.7 11.5 - 15.5 %   Platelets 182 150 - 400 K/uL  Differential     Status: None   Collection Time: 06/09/15  9:31 AM  Result Value Ref Range   Neutrophils Relative % 45 %   Neutro Abs 2.5 1.7 - 7.7 K/uL   Lymphocytes Relative 45 %   Lymphs Abs 2.6 0.7 - 4.0 K/uL   Monocytes Relative 7 %   Monocytes Absolute 0.4 0.1 - 1.0 K/uL   Eosinophils Relative 3 %  Eosinophils Absolute 0.2 0.0 - 0.7 K/uL   Basophils Relative 0 %   Basophils Absolute 0.0 0.0 - 0.1 K/uL  I-stat troponin, ED (not at [facility], [facility])     Status: None   Collection Time: 06/09/15  9:38 AM  Result Value Ref Range   Troponin i, poc 0.00 0.00 - 0.08 ng/mL   Comment 3            Comment: Due to the release kinetics of cTnI, a negative result within the first hours of the onset of symptoms does not rule out myocardial infarction with certainty. If myocardial infarction is still suspected, repeat the test at appropriate intervals.   I-Stat Chem 8, ED  (not at [facility], [facility])     Status: Abnormal   Collection Time: 06/09/15  9:43 AM  Result Value Ref Range   Sodium 140 135 - 145 mmol/L   Potassium 4.3 3.5 - 5.1 mmol/L   Chloride 100 (L) 101 - 111 mmol/L   BUN 6 6 - 20 mg/dL   Creatinine, Ser 0.80 0.44 - 1.00 mg/dL   Glucose, Bld 95 65 - 99 mg/dL   Calcium, Ion 1.16 1.12 - 1.23 mmol/L   TCO2 31 0 - 100 mmol/L   Hemoglobin 17.3 (H) 12.0 - 15.0 g/dL   HCT 51.0 (H) 36.0 - 46.0 %   Ct Head Wo  Contrast  06/09/2015  CLINICAL DATA:  Sudden onset of slurred speech. Suspected focal seizure. EXAM: CT HEAD WITHOUT CONTRAST TECHNIQUE: Contiguous axial images were obtained from the base of the skull through the vertex without intravenous contrast. COMPARISON:  Multiple prior studies including CT head 05/26/15, CT perfusion and CT angio head 12/17, and MR brain 05/27/15. FINDINGS: LEFT hemisphere Gyriform hyperattenuation over the insula, frontal greater than temporal operculum, and parietal lobe corresponds to the area of restricted diffusion on prior MR, and is favored to represent luxury perfusion within an area of subacute ischemia. No lobar hematoma is observed and there is no subarachnoid blood. No CT signs of large vessel occlusion despite severe LEFT MCA M2 stenosis detected previously. No midline shift. No abnormal findings on the RIGHT or in the posterior fossa. Calvarium intact. No sinus or mastoid disease. IMPRESSION: LEFT hemisphere gyriform hyperattenuation favored to represent luxury perfusion. This corresponds to the area previously documented ischemia, without areas of definite new infarction. Critical Value/emergent results were called by telephone at the time of interpretation on 06/09/2015 at 9:49 am to Dr. Nicole Kindred, who verbally acknowledged these results. Electronically Signed   By: Staci Righter M.D.   On: 06/09/2015 09:58    Assessment/Plan 59 year old lady with multiple risk factors for stroke as well as acute left MCA territory stroke 2 weeks ago presenting with new onset focal seizure activity with transient expressive and receptive aphasia, as well as local motor seizure activity right face. She has no indications recurrent acute seizure at this point. PT is back to baseline per patient and family members.  Recommendations: 1. Keppra 1000 mg loading dose IV, followed by 500 mg twice a day 2. Repeat MRI will be deferred, as patient requires conscious sedation to undergo MRI  study and likelihood of recurrent stroke is low. As well, management would not change, with anterior anticoagulation as well as starting seizure management with Keppra. 3. Home health therapy intervention is planned 4. Keep follow-up stroke/neurology appointment as planned with Dr.Sethi.  C.R. Nicole Kindred, MD [facility] [phone]  06/09/2015, 10:11 AM

## 2015-06-15 ENCOUNTER — Encounter: Payer: Medicare HMO | Admitting: Cardiology

## 2015-06-15 NOTE — Progress Notes (Signed)
This encounter was created in error - please disregard.

## 2015-06-18 ENCOUNTER — Inpatient Hospital Stay: Payer: Medicare HMO | Admitting: Family Medicine

## 2015-06-20 ENCOUNTER — Ambulatory Visit: Payer: Medicare HMO | Attending: Family Medicine | Admitting: Family Medicine

## 2015-06-20 ENCOUNTER — Encounter: Payer: Self-pay | Admitting: Family Medicine

## 2015-06-20 VITALS — BP 131/83 | HR 56 | Temp 97.4°F | Resp 13 | Ht 59.0 in | Wt 258.0 lb

## 2015-06-20 DIAGNOSIS — G4733 Obstructive sleep apnea (adult) (pediatric): Secondary | ICD-10-CM | POA: Insufficient documentation

## 2015-06-20 DIAGNOSIS — J449 Chronic obstructive pulmonary disease, unspecified: Secondary | ICD-10-CM | POA: Insufficient documentation

## 2015-06-20 DIAGNOSIS — I493 Ventricular premature depolarization: Secondary | ICD-10-CM | POA: Insufficient documentation

## 2015-06-20 DIAGNOSIS — I350 Nonrheumatic aortic (valve) stenosis: Secondary | ICD-10-CM | POA: Diagnosis not present

## 2015-06-20 DIAGNOSIS — I638 Other cerebral infarction: Secondary | ICD-10-CM | POA: Insufficient documentation

## 2015-06-20 DIAGNOSIS — K219 Gastro-esophageal reflux disease without esophagitis: Secondary | ICD-10-CM | POA: Insufficient documentation

## 2015-06-20 DIAGNOSIS — Z79899 Other long term (current) drug therapy: Secondary | ICD-10-CM | POA: Insufficient documentation

## 2015-06-20 DIAGNOSIS — F17210 Nicotine dependence, cigarettes, uncomplicated: Secondary | ICD-10-CM | POA: Insufficient documentation

## 2015-06-20 DIAGNOSIS — Z86718 Personal history of other venous thrombosis and embolism: Secondary | ICD-10-CM | POA: Diagnosis not present

## 2015-06-20 DIAGNOSIS — N83209 Unspecified ovarian cyst, unspecified side: Secondary | ICD-10-CM | POA: Insufficient documentation

## 2015-06-20 DIAGNOSIS — G4700 Insomnia, unspecified: Secondary | ICD-10-CM | POA: Insufficient documentation

## 2015-06-20 DIAGNOSIS — G8929 Other chronic pain: Secondary | ICD-10-CM | POA: Diagnosis not present

## 2015-06-20 DIAGNOSIS — Z7901 Long term (current) use of anticoagulants: Secondary | ICD-10-CM | POA: Diagnosis not present

## 2015-06-20 DIAGNOSIS — Z86711 Personal history of pulmonary embolism: Secondary | ICD-10-CM | POA: Diagnosis not present

## 2015-06-20 DIAGNOSIS — R4701 Aphasia: Secondary | ICD-10-CM | POA: Diagnosis not present

## 2015-06-20 DIAGNOSIS — J45909 Unspecified asthma, uncomplicated: Secondary | ICD-10-CM | POA: Insufficient documentation

## 2015-06-20 DIAGNOSIS — F329 Major depressive disorder, single episode, unspecified: Secondary | ICD-10-CM | POA: Diagnosis not present

## 2015-06-20 DIAGNOSIS — I69359 Hemiplegia and hemiparesis following cerebral infarction affecting unspecified side: Secondary | ICD-10-CM | POA: Insufficient documentation

## 2015-06-20 DIAGNOSIS — Z85038 Personal history of other malignant neoplasm of large intestine: Secondary | ICD-10-CM | POA: Insufficient documentation

## 2015-06-20 DIAGNOSIS — E785 Hyperlipidemia, unspecified: Secondary | ICD-10-CM | POA: Insufficient documentation

## 2015-06-20 DIAGNOSIS — I10 Essential (primary) hypertension: Secondary | ICD-10-CM

## 2015-06-20 DIAGNOSIS — I639 Cerebral infarction, unspecified: Secondary | ICD-10-CM

## 2015-06-20 DIAGNOSIS — R569 Unspecified convulsions: Secondary | ICD-10-CM | POA: Insufficient documentation

## 2015-06-20 DIAGNOSIS — M1990 Unspecified osteoarthritis, unspecified site: Secondary | ICD-10-CM | POA: Diagnosis not present

## 2015-06-20 DIAGNOSIS — Z888 Allergy status to other drugs, medicaments and biological substances status: Secondary | ICD-10-CM | POA: Insufficient documentation

## 2015-06-20 DIAGNOSIS — F419 Anxiety disorder, unspecified: Secondary | ICD-10-CM | POA: Diagnosis not present

## 2015-06-20 DIAGNOSIS — I82401 Acute embolism and thrombosis of unspecified deep veins of right lower extremity: Secondary | ICD-10-CM

## 2015-06-20 MED ORDER — HYDROXYZINE HCL 25 MG PO TABS: 25.0000 mg | ORAL_TABLET | Freq: Three times a day (TID) | ORAL | Status: DC | PRN

## 2015-06-20 NOTE — Patient Instructions (Signed)
Generalized Anxiety Disorder Generalized anxiety disorder (GAD) is a mental disorder. It interferes with life functions, including relationships, work, and school. GAD is different from normal anxiety, which everyone experiences at some point in their lives in response to specific life events and activities. Normal anxiety actually helps us prepare for and get through these life events and activities. Normal anxiety goes away after the event or activity is over.  GAD causes anxiety that is not necessarily related to specific events or activities. It also causes excess anxiety in proportion to specific events or activities. The anxiety associated with GAD is also difficult to control. GAD can vary from mild to severe. People with severe GAD can have intense waves of anxiety with physical symptoms (panic attacks).  SYMPTOMS The anxiety and worry associated with GAD are difficult to control. This anxiety and worry are related to many life events and activities and also occur more days than not for 6 months or longer. People with GAD also have three or more of the following symptoms (one or more in children):  Restlessness.   Fatigue.  Difficulty concentrating.   Irritability.  Muscle tension.  Difficulty sleeping or unsatisfying sleep. DIAGNOSIS GAD is diagnosed through an assessment by your health care provider. Your health care provider will ask you questions aboutyour mood,physical symptoms, and events in your life. Your health care provider may ask you about your medical history and use of alcohol or drugs, including prescription medicines. Your health care provider may also do a physical exam and blood tests. Certain medical conditions and the use of certain substances can cause symptoms similar to those associated with GAD. Your health care provider may refer you to a mental health specialist for further evaluation. TREATMENT The following therapies are usually used to treat GAD:    Medication. Antidepressant medication usually is prescribed for long-term daily control. Antianxiety medicines may be added in severe cases, especially when panic attacks occur.   Talk therapy (psychotherapy). Certain types of talk therapy can be helpful in treating GAD by providing support, education, and guidance. A form of talk therapy called cognitive behavioral therapy can teach you healthy ways to think about and react to daily life events and activities.  Stress managementtechniques. These include yoga, meditation, and exercise and can be very helpful when they are practiced regularly. A mental health specialist can help determine which treatment is best for you. Some people see improvement with one therapy. However, other people require a combination of therapies.   This information is not intended to replace advice given to you by your health care provider. Make sure you discuss any questions you have with your health care provider.   Document Released: 09/20/2012 Document Revised: 06/16/2014 Document Reviewed: 09/20/2012 Elsevier Interactive Patient Education 2016 [employer]  

## 2015-06-20 NOTE — Progress Notes (Signed)
Patient here to follow up after recent CVA She reports left sided pain from her arm to her foot Her biggest  Complaint today was that she was anxious and depressed and the celexa and ativan are not helping She states therapy is coming to the house and her daughter also comes over to help with medications

## 2015-06-21 NOTE — Progress Notes (Signed)
This encounter was created in error - please disregard.

## 2015-06-22 ENCOUNTER — Encounter: Payer: Medicare HMO | Admitting: Cardiology

## 2015-06-22 NOTE — Progress Notes (Signed)
CC: Hospital follow-up.  HPI: Brooke [name] is a 60 y.o. female who comes in today for hospital follow-up after hospitalization at [facility] from 05/26/15 -06/08/15 for a middle cerebral artery stroke .  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 MRI/MRA brain revealed large L-MCA territory infarct with moderate to severe focal stenosis within more lateral L-M2 branch. INR 1.61 at admission and she was started on IV heparin for treatment. 2 D echo with EF 50-55%, mild aortic stenosis, trivial MR and no wall abnormality.  She had left-sided hemiparesis and expressive aphasia for which she was underwent comprehensive inpatient rehabilitation and was subsequently discharged.  She represented to the ED at [facility] again a day after discharge with right facial twitching and inability to speak noticed by family members which resolved on presentation to the ED. CT scan of her head showed left gyriform hyperattenuation, most likely luxury reperfusion in the area of previously documented ischemia in the left hemisphere. No evidence of new infarction seen. She was seen by neurology and symptoms were thought to be secondary to new onset stroke and so she was placed on Keppra.   Interval history: Does have some degree of expressive aphasia. Patient complains of pain in her left side which is also the weaker side. She is undergoing physical therapy at home. Complains of anxiety which causes insomnia and is requesting something for this. The patient also called her daughter on the phone who also voiced the same concerns about her mother. Denies any recent seizures. Scheduled to see neurology in March and cardiology in a couple of  days.  Allergies  Allergen Reactions  . Kiwi Extract Anaphylaxis, Hives and Swelling    Makes her swell all over   . Aspirin Nausea Only    Can take as long as enteric coated   Past Medical History  Diagnosis Date  . Hypertension   . Asthma   . COPD (chronic obstructive pulmonary disease) ([place])   . Anxiety   . Colon cancer ([place])     a. s/p surgery, chemotherapy.  . Dyslipidemia   . Migraine headache   . Chronic bronchitis   . Uterine fibroid   . Ovarian cyst   . GERD (gastroesophageal reflux disease)   . Morbid obesity ([place])   . Obstructive sleep apnea     mild  . Chronic pain   . Chronic back pain   . Arthritis   . Osteoarthritis   . Depression   . DVT (deep venous thrombosis) ([place]) 02/2014  . Bilateral pulmonary embolism ([place]) 02/2014    a. PCCM recommended lifelong anticoagulation.  . Tobacco abuse   . PVC's (premature ventricular contractions) 2013  . Aortic stenosis     a. mild by echo 05/2015.   Current Outpatient Prescriptions on File Prior to Visit  Medication Sig Dispense Refill  . acetaminophen (TYLENOL) 500 MG tablet Take 500 mg by mouth every 6 (six) hours as needed for headache.    . albuterol (PROVENTIL HFA;VENTOLIN HFA) 108 (90 BASE) MCG/ACT inhaler Inhale 2 puffs into the lungs every 6 (six) hours as needed for wheezing or shortness of breath (shortness of breath).    Marland Kitchen atorvastatin (LIPITOR) 40 MG tablet Take 1 tablet (40 mg total) by mouth daily at 6 PM.  30 tablet 0  . citalopram (CELEXA) 20 MG tablet Take 1 tablet (20 mg total) by mouth daily. 30 tablet 0  . gabapentin (NEURONTIN) 300 MG capsule Take 1 capsule (300 mg total) by mouth 2 (two) times daily. 60 capsule 0  . hydrochlorothiazide (MICROZIDE) 12.5 MG capsule Take 1 capsule by mouth daily.    Marland Kitchen levETIRAcetam (KEPPRA) 500 MG tablet Take 1 tablet (500 mg total) by mouth 2 (two) times daily. 60 tablet 0  . LORazepam (ATIVAN) 1 MG tablet Take 1 tablet (1 mg total) by mouth every 6 (six) hours as  needed for anxiety (anxiety). 30 tablet 0  . omeprazole (PRILOSEC) 40 MG capsule Take 40 mg by mouth daily.    . rivaroxaban (XARELTO) 20 MG TABS tablet Take 1 tablet (20 mg total) by mouth daily with supper. 30 tablet 0  . B Complex Vitamins (VITAMIN-B COMPLEX PO) Take 1 tablet by mouth daily. Reported on 06/20/2015    . ipratropium-albuterol (DUONEB) 0.5-2.5 (3) MG/3ML SOLN Take 3 mLs by nebulization every 6 (six) hours. (Patient not taking: Reported on 06/20/2015) 360 mL 1  . Menthol-Methyl Salicylate (GRX ANALGESIC BALM) OINT Apply 1 application topically 2 (two) times daily as needed. (Patient not taking: Reported on 06/20/2015)    . oxyCODONE (OXY IR/ROXICODONE) 5 MG immediate release tablet Take 1 tablet (5 mg total) by mouth every 12 (twelve) hours as needed for severe pain. (Patient not taking: Reported on 06/20/2015) 30 tablet 0  . [DISCONTINUED] fluticasone (FLOVENT HFA) 110 MCG/ACT inhaler Inhale 2 puffs into the lungs every 12 (twelve) hours. (Patient not taking: Reported on 09/23/2014) 1 Inhaler 12  . [DISCONTINUED] traZODone (DESYREL) 50 MG tablet Take 1 tablet (50 mg total) by mouth at bedtime. (Patient not taking: Reported on 02/15/2015) 30 tablet 2   No current facility-administered medications on file prior to visit.   Family History  Problem Relation Age of Onset  . Uterine cancer Mother   . Stroke Father   . Colon cancer Maternal Uncle   . Diabetes Father   . Ovarian cancer Mother   . Hypertension Father   . Breast cancer Maternal Grandmother   . Diabetes Sister   . Asthma Child   . Asthma Child    Social History   Social History  . Marital Status: Widowed    Spouse Name: N/A  . Number of Children: 2  . Years of Education: N/A   Occupational History  . disabled    Social History Main Topics  . Smoking status: Current Some Day Smoker -- 1.00 packs/day for 37 years    Types: Cigarettes  . Smokeless tobacco: Never Used     Comment: trying to quit, down to .5ppd X2  years ago.   . Alcohol Use: No  . Drug Use: No  . Sexual Activity: Yes   Other Topics Concern  . Not on file   Social History Narrative   Lives alone.  Widowed but has a female companion who helps out.  Ambulates with a cane.  Drinks coffee daily.             Review of Systems: Constitutional: Negative for fever, chills, diaphoresis, activity change, appetite change and fatigue. HENT: Negative for ear pain, nosebleeds, congestion, facial swelling, rhinorrhea, neck pain, neck stiffness and ear discharge.  Eyes: Negative for pain, discharge, redness, itching and visual disturbance. Respiratory: Negative for cough, choking, chest tightness, shortness of breath, wheezing and stridor.  Cardiovascular: Negative for chest pain, palpitations and  leg swelling. Gastrointestinal: Negative for abdominal distention. Genitourinary: Negative for dysuria, urgency, frequency, hematuria, flank pain, decreased urine volume, difficulty urinating and dyspareunia.  Musculoskeletal: Negative for back pain, joint swelling, arthralgias and positive for gait problem. Neurological: See history of present illness.  Hematological: Negative for adenopathy. Does not bruise/bleed easily. Psychiatric/Behavioral: Positive for anxiety and insomnia.  Objective:   Filed Vitals:   06/20/15 1556  BP: 131/83  Pulse: 56  Temp: 97.4 F (36.3 C)  Resp: 13    Physical Exam: Constitutional: Patient appears well-developed and well-nourished. No distress. HENT: Normocephalic, atraumatic, External right and left ear normal. Oropharynx is clear and moist.  Eyes: Conjunctivae and EOM are normal. PERRLA, no scleral icterus. Neck: Normal ROM. Neck supple. No JVD. No tracheal deviation. No thyromegaly. CVS: RRR, S1/S2 +, no murmurs, no gallops, no carotid bruit.  Pulmonary: Effort and breath sounds normal, no stridor, rhonchi, wheezes, rales.  Abdominal: Soft. BS +,  no distension, tenderness, rebound or guarding.    Musculoskeletal: Normal range of motion. No edema and no tenderness.  Lymphadenopathy: No lymphadenopathy noted, cervical, inguinal or axillary Neuro: Expressive aphasia, Left facial and left pulse 70; motor strength in upper extremities is 3+/5 bilaterally in lower extremities is 4/5 bilaterally. Skin: Skin is warm and dry. No rash noted. Not diaphoretic. No erythema. No pallor. Psychiatric: Normal mood and affect. Behavior, judgment, thought content normal.  Lab Results  Component Value Date   WBC 5.6 06/09/2015   HGB 17.3* 06/09/2015   HCT 51.0* 06/09/2015   MCV 100.0 06/09/2015   PLT 182 06/09/2015   Lab Results  Component Value Date   CREATININE 0.80 06/09/2015   BUN 6 06/09/2015   NA 140 06/09/2015   K 4.3 06/09/2015   CL 100* 06/09/2015   CO2 29 06/09/2015    Lab Results  Component Value Date   HGBA1C 5.6 05/26/2015   Lipid Panel     Component Value Date/Time   CHOL 139 05/27/2015 0110   TRIG 62 05/27/2015 0110   HDL 54 05/27/2015 0110   CHOLHDL 2.6 05/27/2015 0110   VLDL 12 05/27/2015 0110   LDLCALC 73 05/27/2015 0110       Assessment and plan:  Acute ischemic stroke with left hemiparesis and expressive aphasia: Aggressive risk factor modification. Continue physical therapy, occupational therapy and speech therapy as per protocol. Advised on fall precautions - she is high risk for falls.  Seizures: No recent seizures. Continue Keppra Advised to keep appointment with neurology.  Anxiety and insomnia: Commenced on hydroxyzine hopefully the sedating side effects will help with insomnia. In the event that insomnia persist at her next visit she may need a sleeping aid. She is already on Celexa  Right lower extremity DVT and bilateral PE in 2015: She is on chronic lifelong anticoagulation  Colon cancer: Status post surgery and chemotherapy. Managed by the [facility].  PVCs/Bigeminy: Stable Scheduled to see cardiology.  This note has been  created with Surveyor, quantity. Any transcriptional errors are unintentional.               Arnoldo Morale, MD. [facility] [phone] 06/22/2015, 8:20 AM

## 2015-06-25 ENCOUNTER — Encounter: Payer: Self-pay | Admitting: Cardiology

## 2015-07-04 ENCOUNTER — Ambulatory Visit: Payer: Medicare HMO | Admitting: Family Medicine

## 2015-07-06 DIAGNOSIS — J449 Chronic obstructive pulmonary disease, unspecified: Secondary | ICD-10-CM | POA: Diagnosis not present

## 2015-07-06 DIAGNOSIS — I69390 Apraxia following cerebral infarction: Secondary | ICD-10-CM | POA: Diagnosis not present

## 2015-07-06 DIAGNOSIS — I69391 Dysphagia following cerebral infarction: Secondary | ICD-10-CM | POA: Diagnosis not present

## 2015-07-06 DIAGNOSIS — I69351 Hemiplegia and hemiparesis following cerebral infarction affecting right dominant side: Secondary | ICD-10-CM | POA: Diagnosis not present

## 2015-07-10 ENCOUNTER — Other Ambulatory Visit: Payer: Self-pay | Admitting: Physical Medicine and Rehabilitation

## 2015-07-12 ENCOUNTER — Telehealth: Payer: Self-pay

## 2015-07-12 NOTE — Telephone Encounter (Addendum)
Cathy-PT with [facility]- would like AP to know that pt has cancelled appt's this week due to moving into a new home. She would like an additional visit approved for next week. Verbal orders approved.

## 2015-08-07 ENCOUNTER — Other Ambulatory Visit: Payer: Self-pay | Admitting: Physical Medicine and Rehabilitation

## 2015-08-09 ENCOUNTER — Encounter (HOSPITAL_COMMUNITY): Payer: Self-pay | Admitting: Emergency Medicine

## 2015-08-09 ENCOUNTER — Emergency Department (HOSPITAL_COMMUNITY)
Admission: EM | Admit: 2015-08-09 | Discharge: 2015-08-09 | Disposition: A | Discharge status: Home / Self Care | Payer: Medicare HMO | Attending: Emergency Medicine | Admitting: Emergency Medicine

## 2015-08-09 ENCOUNTER — Emergency Department (HOSPITAL_COMMUNITY): Payer: Medicare HMO

## 2015-08-09 DIAGNOSIS — M1990 Unspecified osteoarthritis, unspecified site: Secondary | ICD-10-CM | POA: Insufficient documentation

## 2015-08-09 DIAGNOSIS — Z86718 Personal history of other venous thrombosis and embolism: Secondary | ICD-10-CM | POA: Insufficient documentation

## 2015-08-09 DIAGNOSIS — E785 Hyperlipidemia, unspecified: Secondary | ICD-10-CM | POA: Insufficient documentation

## 2015-08-09 DIAGNOSIS — G43909 Migraine, unspecified, not intractable, without status migrainosus: Secondary | ICD-10-CM | POA: Diagnosis not present

## 2015-08-09 DIAGNOSIS — Z8673 Personal history of transient ischemic attack (TIA), and cerebral infarction without residual deficits: Secondary | ICD-10-CM | POA: Diagnosis not present

## 2015-08-09 DIAGNOSIS — Z86018 Personal history of other benign neoplasm: Secondary | ICD-10-CM | POA: Diagnosis not present

## 2015-08-09 DIAGNOSIS — Z79899 Other long term (current) drug therapy: Secondary | ICD-10-CM | POA: Insufficient documentation

## 2015-08-09 DIAGNOSIS — K219 Gastro-esophageal reflux disease without esophagitis: Secondary | ICD-10-CM | POA: Diagnosis not present

## 2015-08-09 DIAGNOSIS — J069 Acute upper respiratory infection, unspecified: Secondary | ICD-10-CM | POA: Diagnosis not present

## 2015-08-09 DIAGNOSIS — Z85038 Personal history of other malignant neoplasm of large intestine: Secondary | ICD-10-CM | POA: Insufficient documentation

## 2015-08-09 DIAGNOSIS — F17210 Nicotine dependence, cigarettes, uncomplicated: Secondary | ICD-10-CM | POA: Insufficient documentation

## 2015-08-09 DIAGNOSIS — G8929 Other chronic pain: Secondary | ICD-10-CM | POA: Insufficient documentation

## 2015-08-09 DIAGNOSIS — F419 Anxiety disorder, unspecified: Secondary | ICD-10-CM | POA: Diagnosis not present

## 2015-08-09 DIAGNOSIS — Z86711 Personal history of pulmonary embolism: Secondary | ICD-10-CM | POA: Diagnosis not present

## 2015-08-09 DIAGNOSIS — Z7901 Long term (current) use of anticoagulants: Secondary | ICD-10-CM | POA: Insufficient documentation

## 2015-08-09 DIAGNOSIS — R000 Tachycardia, unspecified: Secondary | ICD-10-CM | POA: Insufficient documentation

## 2015-08-09 DIAGNOSIS — I10 Essential (primary) hypertension: Secondary | ICD-10-CM | POA: Diagnosis not present

## 2015-08-09 DIAGNOSIS — F329 Major depressive disorder, single episode, unspecified: Secondary | ICD-10-CM | POA: Diagnosis not present

## 2015-08-09 DIAGNOSIS — J449 Chronic obstructive pulmonary disease, unspecified: Secondary | ICD-10-CM | POA: Insufficient documentation

## 2015-08-09 DIAGNOSIS — R509 Fever, unspecified: Secondary | ICD-10-CM | POA: Diagnosis present

## 2015-08-09 HISTORY — DX: Cerebral infarction, unspecified: I63.9

## 2015-08-09 MED ORDER — IPRATROPIUM-ALBUTEROL 0.5-2.5 (3) MG/3ML IN SOLN: 3.0000 mL | RESPIRATORY_TRACT | Status: DC

## 2015-08-09 MED ORDER — ALBUTEROL SULFATE HFA 108 (90 BASE) MCG/ACT IN AERS
2.0000 | INHALATION_SPRAY | Freq: Once | RESPIRATORY_TRACT | Status: AC
  Administered 2015-08-09: 2 via RESPIRATORY_TRACT
  Filled 2015-08-09: qty 6.7

## 2015-08-09 MED ORDER — IPRATROPIUM-ALBUTEROL 0.5-2.5 (3) MG/3ML IN SOLN
3.0000 mL | RESPIRATORY_TRACT | Status: DC
  Administered 2015-08-09: 3 mL via RESPIRATORY_TRACT
  Filled 2015-08-09: qty 3

## 2015-08-09 MED ORDER — PREDNISONE 20 MG PO TABS: ORAL_TABLET | ORAL | Status: DC

## 2015-08-09 MED ORDER — OXYCODONE-ACETAMINOPHEN 5-325 MG PO TABS: 2.0000 | ORAL_TABLET | Freq: Once | ORAL | Status: DC

## 2015-08-09 MED ORDER — SODIUM CHLORIDE 0.9 % IV BOLUS (SEPSIS)
500.0000 mL | Freq: Once | INTRAVENOUS | Status: AC
  Administered 2015-08-09: 500 mL via INTRAVENOUS

## 2015-08-09 MED ORDER — AZITHROMYCIN 250 MG PO TABS: 250.0000 mg | ORAL_TABLET | Freq: Every day | ORAL | Status: DC

## 2015-08-09 MED ORDER — ACETAMINOPHEN 325 MG PO TABS: 650.0000 mg | ORAL_TABLET | Freq: Once | ORAL | Status: DC

## 2015-08-09 MED ORDER — GUAIFENESIN 100 MG/5ML PO LIQD: 100.0000 mg | ORAL | Status: DC | PRN

## 2015-08-09 NOTE — ED Notes (Signed)
DG Chest 2 view documented in error.

## 2015-08-09 NOTE — ED Notes (Signed)
PA at bedside.

## 2015-08-09 NOTE — ED Notes (Signed)
Pt tolerating fluids.   

## 2015-08-09 NOTE — ED Provider Notes (Signed)
CSN: [id number]     Arrival date & time 08/09/15  0359 History   First MD Initiated Contact with Patient 08/09/15 0411     Chief Complaint  Patient presents with  . Fever     (Consider location/radiation/quality/duration/timing/severity/associated sxs/prior Treatment) HPI Brooke [name] is a 60 y.o. female with a history of admission in December for stroke, comes in for evaluation of fever, cough, sore throat and body aches. Symptoms ongoing for the past one day Patient is accompanied by daughter at bedside who reports patient is at baseline. Still has some residual right-sided weakness, slurred speech. Also reports has history of asthma, but has run out of nebulizer medication for some time. Daughter also reports patient has not been drinking very much fluid at home. Denies any headache, visual changes, and transferred, nausea or vomiting, abdominal pain, diarrhea or constipation, new numbness or weakness. Reports she did receive flu shot this year.  Past Medical History  Diagnosis Date  . Hypertension   . Asthma   . COPD (chronic obstructive pulmonary disease) ([employer])   . Anxiety   . Colon cancer ([place])     a. s/p surgery, chemotherapy.  . Dyslipidemia   . Migraine headache   . Chronic bronchitis   . Uterine fibroid   . Ovarian cyst   . GERD (gastroesophageal reflux disease)   . Morbid obesity ([place])   . Obstructive sleep apnea     mild  . Chronic pain   . Chronic back pain   . Arthritis   . Osteoarthritis   . Depression   . DVT (deep venous thrombosis) ([place]) 02/2014  . Bilateral pulmonary embolism ([place]) 02/2014    a. PCCM recommended lifelong anticoagulation.  . Tobacco abuse   . PVC's (premature ventricular contractions) 2013  . Aortic stenosis     a. mild by echo 05/2015.  . Stroke [facility])    Past Surgical History  Procedure Laterality Date  . Knee arthroscopy      bil.  . Carpal tunnel release      rt  . Mouth surgery      teeth extraction  . Cesarean section    .  Subtotal colectomy    . Colonoscopy    . Tubal ligation     Family History  Problem Relation Age of Onset  . Uterine cancer Mother   . Stroke Father   . Colon cancer Maternal Uncle   . Diabetes Father   . Ovarian cancer Mother   . Hypertension Father   . Breast cancer Maternal Grandmother   . Diabetes Sister   . Asthma Child   . Asthma Child    Social History  Substance Use Topics  . Smoking status: Current Some Day Smoker -- 1.00 packs/day for 37 years    Types: Cigarettes  . Smokeless tobacco: Never Used     Comment: trying to quit, down to .5ppd X2 years ago.   . Alcohol Use: No   OB History    Gravida Para Term Preterm AB TAB SAB Ectopic Multiple Living   2 2 2       2      Review of Systems A 10 point review of systems was completed and was negative except for pertinent positives and negatives as mentioned in the history of present illness     Allergies  Kiwi extract and Aspirin  Home Medications   Prior to Admission medications   Medication Sig Start Date End Date Taking? Authorizing Provider  acetaminophen (  TYLENOL) 500 MG tablet Take 500 mg by mouth every 6 (six) hours as needed for headache.   Yes Historical Provider, MD  albuterol (PROVENTIL HFA;VENTOLIN HFA) 108 (90 BASE) MCG/ACT inhaler Inhale 2 puffs into the lungs every 6 (six) hours as needed for wheezing or shortness of breath (shortness of breath). 02/20/14  Yes Oswald Hillock, MD  atorvastatin (LIPITOR) 40 MG tablet Take 1 tablet (40 mg total) by mouth daily at 6 PM. 06/08/15  Yes Ivan Anchors Love, PA-C  citalopram (CELEXA) 20 MG tablet Take 1 tablet (20 mg total) by mouth daily. 06/08/15  Yes Ivan Anchors Love, PA-C  gabapentin (NEURONTIN) 300 MG capsule Take 1 capsule (300 mg total) by mouth 2 (two) times daily. 06/08/15  Yes Ivan Anchors Love, PA-C  hydrochlorothiazide (MICROZIDE) 12.5 MG capsule Take 1 capsule by mouth daily. 06/08/15  Yes Historical Provider, MD  levETIRAcetam (KEPPRA) 500 MG tablet Take 1  tablet (500 mg total) by mouth 2 (two) times daily. 06/09/15  Yes Virgel Manifold, MD  LORazepam (ATIVAN) 1 MG tablet Take 1 tablet (1 mg total) by mouth every 6 (six) hours as needed for anxiety (anxiety). 08/25/14  Yes Lorayne Marek, MD  omeprazole (PRILOSEC) 40 MG capsule Take 40 mg by mouth daily.   Yes Historical Provider, MD  XARELTO 20 MG TABS tablet TAKE ONE TABLET BY MOUTH ONCE DAILY IN THE EVENING WITH SUPPER 08/08/15  Yes Charlett Blake, MD  azithromycin (ZITHROMAX) 250 MG tablet Take 1 tablet (250 mg total) by mouth daily. Take first 2 tablets together, then 1 every day until finished. 08/09/15   Comer Locket, PA-C  guaiFENesin (ROBITUSSIN) 100 MG/5ML liquid Take 5-10 mLs (100-200 mg total) by mouth every 4 (four) hours as needed for cough. 08/09/15   Comer Locket, PA-C  hydrOXYzine (ATARAX/VISTARIL) 25 MG tablet Take 1 tablet (25 mg total) by mouth 3 (three) times daily as needed. Patient not taking: Reported on 08/09/2015 06/20/15   Arnoldo Morale, MD  ipratropium-albuterol (DUONEB) 0.5-2.5 (3) MG/3ML SOLN Take 3 mLs by nebulization every 4 (four) hours. 08/09/15   Comer Locket, PA-C  Menthol-Methyl Salicylate (GRX ANALGESIC BALM) OINT Apply 1 application topically 2 (two) times daily as needed. Patient not taking: Reported on 06/20/2015 06/08/15   Ivan Anchors Love, PA-C  oxyCODONE (OXY IR/ROXICODONE) 5 MG immediate release tablet Take 1 tablet (5 mg total) by mouth every 12 (twelve) hours as needed for severe pain. Patient not taking: Reported on 06/20/2015 06/08/15   Ivan Anchors Love, PA-C  predniSONE (DELTASONE) 20 MG tablet 3 tabs po day one, then 2 po daily x 4 days 08/09/15   Comer Locket, PA-C   BP 160/89 mmHg  Pulse 103  Temp(Src) 100 F (37.8 C) (Oral)  Resp 22  SpO2 92%  LMP 05/24/2003 Physical Exam  Constitutional: She is oriented to person, place, and time. She appears well-developed and well-nourished.  Obese African-American female  HENT:  Head: Normocephalic and  atraumatic.  Mildly dry mucous membranes  Eyes: Conjunctivae are normal. Pupils are equal, round, and reactive to light. Right eye exhibits no discharge. Left eye exhibits no discharge. No scleral icterus.  Neck: Normal range of motion. Neck supple.  Cardiovascular: Regular rhythm and normal heart sounds.   Mild tachycardia with normal heart sounds  Pulmonary/Chest: Effort normal and breath sounds normal. No respiratory distress. She has no wheezes. She has no rales.  Abdominal: Soft. There is no tenderness.  Musculoskeletal: Normal range of motion. She exhibits no edema or  tenderness.  Neurological: She is alert and oriented to person, place, and time.  Cranial Nerves II-XII grossly intact  Skin: Skin is warm and dry. No rash noted.  Psychiatric: She has a normal mood and affect.  Nursing note and vitals reviewed.   ED Course  Procedures (including critical care time) Labs Review Labs Reviewed - No data to display  Imaging Review Dg Chest 2 View  08/09/2015  CLINICAL DATA:  Acute onset of fever and cough. Body aches. Initial encounter. EXAM: CHEST  2 VIEW COMPARISON:  Chest radiograph performed 09/23/2014 FINDINGS: The lungs are well-aerated. Vascular congestion is noted. Minimal bilateral central opacities may reflect minimal interstitial edema or possibly pneumonia, given the patient's symptoms. There is no evidence of pleural effusion or pneumothorax. The heart is borderline enlarged. No acute osseous abnormalities are seen. IMPRESSION: Vascular congestion and borderline cardiomegaly. Minimal bilateral central opacities may reflect minimal interstitial edema or possibly pneumonia, given the patient's symptoms. Electronically Signed   By: Garald Balding M.D.   On: 08/09/2015 05:37   I have personally reviewed and evaluated these images and lab results as part of my medical decision-making.   EKG Interpretation None     Meds given in ED:  Medications  ipratropium-albuterol (DUONEB)  0.5-2.5 (3) MG/3ML nebulizer solution 3 mL (3 mLs Nebulization Given 08/09/15 0453)  acetaminophen (TYLENOL) tablet 650 mg (not administered)  sodium chloride 0.9 % bolus 500 mL (0 mLs Intravenous Stopped 08/09/15 0556)  albuterol (PROVENTIL HFA;VENTOLIN HFA) 108 (90 Base) MCG/ACT inhaler 2 puff (2 puffs Inhalation Given 08/09/15 0601)    New Prescriptions   AZITHROMYCIN (ZITHROMAX) 250 MG TABLET    Take 1 tablet (250 mg total) by mouth daily. Take first 2 tablets together, then 1 every day until finished.   GUAIFENESIN (ROBITUSSIN) 100 MG/5ML LIQUID    Take 5-10 mLs (100-200 mg total) by mouth every 4 (four) hours as needed for cough.   IPRATROPIUM-ALBUTEROL (DUONEB) 0.5-2.5 (3) MG/3ML SOLN    Take 3 mLs by nebulization every 4 (four) hours.   PREDNISONE (DELTASONE) 20 MG TABLET    3 tabs po day one, then 2 po daily x 4 days   Filed Vitals:   08/09/15 0415 08/09/15 0456 08/09/15 0530 08/09/15 0531  BP: 128/73 150/91 160/89   Pulse: 114 103  103  Temp: 100 F (37.8 C)     TempSrc: Oral     Resp: 22     SpO2: 92% 91%  92%    MDM  EISLEY JACOBI is a 60 y.o. female history of asthma versus COPD, comes in for evaluation of fever, runny nose, cough, myalgias. On exam, she is mildly febrile at 100F, slightly tachycardic, normotensive and 92% on room air. She does have some wheezing and mild rales on exam. Plan for chest x-ray to rule out pneumonia. Chest x-ray shows possible pneumonia. Plan to treat empirically for CP. Also discharge with steroids, albuterol inhaler and refill prescription for albuterol nebulizer medication. Symptoms most consistent with respiratory infection. Low suspicion for pulmonary embolus or other emergent pathology. Patient's daughter lives with her, is at bedside and reports will be or follow with PCP this week for reevaluation and agrees to return to ED for any new or worsening symptoms. Patient is hemodynamically stable, tachycardia is improving with fluids. She is  tolerating oral fluids. Discussed aggressive oral rehydration home with water or other electrolyte balance solution. She verbalizes understanding and agrees with this plan as well as subsequent discharge. Final diagnoses:  URI (upper respiratory infection)        Comer Locket, PA-C 08/09/15 [id number]  Noemi Chapel, MD 08/10/15 [phone]

## 2015-08-09 NOTE — ED Notes (Signed)
Pt recently had a stroke in December  Pt states she has a fever and cough that started on Wednesday  Pt has right sided weakness and slurred speech from her stroke

## 2015-08-09 NOTE — Discharge Instructions (Signed)
You'll be treated for pneumonia. You will be treated for this with antibiotics. Take all of your medications as prescribed. Do not save or share them. Use your rescue inhaler as needed for wheezing. Follow up with your doctor this week for reevaluation. Return to ED for new or worsening symptoms as we discussed.  Upper Respiratory Infection, Adult Most upper respiratory infections (URIs) are a viral infection of the air passages leading to the lungs. A URI affects the nose, throat, and upper air passages. The most common type of URI is nasopharyngitis and is typically referred to as "the common cold." URIs run their course and usually go away on their own. Most of the time, a URI does not require medical attention, but sometimes a bacterial infection in the upper airways can follow a viral infection. This is called a secondary infection. Sinus and middle ear infections are common types of secondary upper respiratory infections. Bacterial pneumonia can also complicate a URI. A URI can worsen asthma and chronic obstructive pulmonary disease (COPD). Sometimes, these complications can require emergency medical care and may be life threatening.  CAUSES Almost all URIs are caused by viruses. A virus is a type of germ and can spread from one person to another.  RISKS FACTORS You may be at risk for a URI if:   You smoke.   You have chronic heart or lung disease.  You have a weakened defense (immune) system.   You are very young or very old.   You have nasal allergies or asthma.  You work in crowded or poorly ventilated areas.  You work in health care facilities or schools. SIGNS AND SYMPTOMS  Symptoms typically develop 2-3 days after you come in contact with a cold virus. Most viral URIs last 7-10 days. However, viral URIs from the influenza virus (flu virus) can last 14-18 days and are typically more severe. Symptoms may include:   Runny or stuffy (congested) nose.   Sneezing.   Cough.    Sore throat.   Headache.   Fatigue.   Fever.   Loss of appetite.   Pain in your forehead, behind your eyes, and over your cheekbones (sinus pain).  Muscle aches.  DIAGNOSIS  Your health care provider may diagnose a URI by:  Physical exam.  Tests to check that your symptoms are not due to another condition such as:  Strep throat.  Sinusitis.  Pneumonia.  Asthma. TREATMENT  A URI goes away on its own with time. It cannot be cured with medicines, but medicines may be prescribed or recommended to relieve symptoms. Medicines may help:  Reduce your fever.  Reduce your cough.  Relieve nasal congestion. HOME CARE INSTRUCTIONS   Take medicines only as directed by your health care provider.   Gargle warm saltwater or take cough drops to comfort your throat as directed by your health care provider.  Use a warm mist humidifier or inhale steam from a shower to increase air moisture. This may make it easier to breathe.  Drink enough fluid to keep your urine clear or pale yellow.   Eat soups and other clear broths and maintain good nutrition.   Rest as needed.   Return to work when your temperature has returned to normal or as your health care provider advises. You may need to stay home longer to avoid infecting others. You can also use a face mask and careful hand washing to prevent spread of the virus.  Increase the usage of your inhaler if you  have asthma.   Do not use any tobacco products, including cigarettes, chewing tobacco, or electronic cigarettes. If you need help quitting, ask your health care provider. PREVENTION  The best way to protect yourself from getting a cold is to practice good hygiene.   Avoid oral or hand contact with people with cold symptoms.   Wash your hands often if contact occurs.  There is no clear evidence that vitamin C, vitamin E, echinacea, or exercise reduces the chance of developing a cold. However, it is always  recommended to get plenty of rest, exercise, and practice good nutrition.  SEEK MEDICAL CARE IF:   You are getting worse rather than better.   Your symptoms are not controlled by medicine.   You have chills.  You have worsening shortness of breath.  You have brown or red mucus.  You have yellow or brown nasal discharge.  You have pain in your face, especially when you bend forward.  You have a fever.  You have swollen neck glands.  You have pain while swallowing.  You have white areas in the back of your throat. SEEK IMMEDIATE MEDICAL CARE IF:   You have severe or persistent:  Headache.  Ear pain.  Sinus pain.  Chest pain.  You have chronic lung disease and any of the following:  Wheezing.  Prolonged cough.  Coughing up blood.  A change in your usual mucus.  You have a stiff neck.  You have changes in your:  Vision.  Hearing.  Thinking.  Mood. MAKE SURE YOU:   Understand these instructions.  Will watch your condition.  Will get help right away if you are not doing well or get worse.   This information is not intended to replace advice given to you by your health care provider. Make sure you discuss any questions you have with your health care provider.   Document Released: 11/19/2000 Document Revised: 10/10/2014 Document Reviewed: 08/31/2013 Elsevier Interactive Patient Education [employer].

## 2015-08-16 ENCOUNTER — Ambulatory Visit: Payer: Medicare HMO | Admitting: Neurology

## 2015-09-13 ENCOUNTER — Ambulatory Visit: Payer: Medicare HMO | Admitting: Neurology

## 2015-09-14 ENCOUNTER — Encounter: Payer: Self-pay | Admitting: Neurology

## 2015-09-16 ENCOUNTER — Encounter (HOSPITAL_COMMUNITY): Payer: Self-pay

## 2015-09-16 ENCOUNTER — Emergency Department (HOSPITAL_COMMUNITY): Payer: Medicare HMO

## 2015-09-16 ENCOUNTER — Telehealth (HOSPITAL_BASED_OUTPATIENT_CLINIC_OR_DEPARTMENT_OTHER): Payer: Self-pay | Admitting: *Deleted

## 2015-09-16 ENCOUNTER — Emergency Department (HOSPITAL_COMMUNITY)
Admission: EM | Admit: 2015-09-16 | Discharge: 2015-09-16 | Disposition: A | Discharge status: Home / Self Care | Payer: Medicare HMO | Attending: Emergency Medicine | Admitting: Emergency Medicine

## 2015-09-16 DIAGNOSIS — M79601 Pain in right arm: Secondary | ICD-10-CM | POA: Diagnosis present

## 2015-09-16 DIAGNOSIS — I10 Essential (primary) hypertension: Secondary | ICD-10-CM | POA: Diagnosis not present

## 2015-09-16 DIAGNOSIS — Z86718 Personal history of other venous thrombosis and embolism: Secondary | ICD-10-CM | POA: Diagnosis not present

## 2015-09-16 DIAGNOSIS — Z85038 Personal history of other malignant neoplasm of large intestine: Secondary | ICD-10-CM | POA: Insufficient documentation

## 2015-09-16 DIAGNOSIS — Z8673 Personal history of transient ischemic attack (TIA), and cerebral infarction without residual deficits: Secondary | ICD-10-CM | POA: Diagnosis not present

## 2015-09-16 DIAGNOSIS — M79621 Pain in right upper arm: Secondary | ICD-10-CM | POA: Diagnosis not present

## 2015-09-16 DIAGNOSIS — Z79899 Other long term (current) drug therapy: Secondary | ICD-10-CM | POA: Insufficient documentation

## 2015-09-16 DIAGNOSIS — M1990 Unspecified osteoarthritis, unspecified site: Secondary | ICD-10-CM | POA: Diagnosis not present

## 2015-09-16 DIAGNOSIS — M25531 Pain in right wrist: Secondary | ICD-10-CM | POA: Diagnosis not present

## 2015-09-16 DIAGNOSIS — F419 Anxiety disorder, unspecified: Secondary | ICD-10-CM | POA: Diagnosis not present

## 2015-09-16 DIAGNOSIS — G43909 Migraine, unspecified, not intractable, without status migrainosus: Secondary | ICD-10-CM | POA: Diagnosis not present

## 2015-09-16 DIAGNOSIS — E785 Hyperlipidemia, unspecified: Secondary | ICD-10-CM | POA: Diagnosis not present

## 2015-09-16 DIAGNOSIS — M25511 Pain in right shoulder: Secondary | ICD-10-CM | POA: Insufficient documentation

## 2015-09-16 DIAGNOSIS — R05 Cough: Secondary | ICD-10-CM | POA: Diagnosis not present

## 2015-09-16 DIAGNOSIS — G8190 Hemiplegia, unspecified affecting unspecified side: Secondary | ICD-10-CM | POA: Insufficient documentation

## 2015-09-16 DIAGNOSIS — Z8742 Personal history of other diseases of the female genital tract: Secondary | ICD-10-CM | POA: Diagnosis not present

## 2015-09-16 DIAGNOSIS — F329 Major depressive disorder, single episode, unspecified: Secondary | ICD-10-CM | POA: Insufficient documentation

## 2015-09-16 DIAGNOSIS — R059 Cough, unspecified: Secondary | ICD-10-CM

## 2015-09-16 DIAGNOSIS — F17210 Nicotine dependence, cigarettes, uncomplicated: Secondary | ICD-10-CM | POA: Insufficient documentation

## 2015-09-16 DIAGNOSIS — K219 Gastro-esophageal reflux disease without esophagitis: Secondary | ICD-10-CM | POA: Diagnosis not present

## 2015-09-16 DIAGNOSIS — M79631 Pain in right forearm: Secondary | ICD-10-CM | POA: Diagnosis not present

## 2015-09-16 DIAGNOSIS — Z86018 Personal history of other benign neoplasm: Secondary | ICD-10-CM | POA: Diagnosis not present

## 2015-09-16 DIAGNOSIS — M25521 Pain in right elbow: Secondary | ICD-10-CM | POA: Insufficient documentation

## 2015-09-16 DIAGNOSIS — Z86711 Personal history of pulmonary embolism: Secondary | ICD-10-CM | POA: Diagnosis not present

## 2015-09-16 DIAGNOSIS — J449 Chronic obstructive pulmonary disease, unspecified: Secondary | ICD-10-CM | POA: Insufficient documentation

## 2015-09-16 DIAGNOSIS — G8929 Other chronic pain: Secondary | ICD-10-CM | POA: Insufficient documentation

## 2015-09-16 LAB — BASIC METABOLIC PANEL
Anion gap: 7 (ref 5–15)
BUN: 6 mg/dL (ref 6–20)
CHLORIDE: 105 mmol/L (ref 101–111)
CO2: 27 mmol/L (ref 22–32)
CREATININE: 0.85 mg/dL (ref 0.44–1.00)
Calcium: 8.6 mg/dL — ABNORMAL LOW (ref 8.9–10.3)
GFR calc Af Amer: >60 mL/min (ref >60)
GFR calc non Af Amer: >60 mL/min (ref >60)
Glucose, Bld: 112 mg/dL — ABNORMAL HIGH (ref 65–99)
Potassium: 3.9 mmol/L (ref 3.5–5.1)
SODIUM: 139 mmol/L (ref 135–145)

## 2015-09-16 LAB — CBC WITH DIFFERENTIAL/PLATELET
BASOS PCT: 0 %
Basophils Absolute: 0 10*3/uL (ref 0.0–0.1)
Eosinophils Absolute: 0.1 10*3/uL (ref 0.0–0.7)
Eosinophils Relative: 2 %
HCT: 38 % (ref 36.0–46.0)
HEMOGLOBIN: 12.7 g/dL (ref 12.0–15.0)
LYMPHS PCT: 37 %
Lymphs Abs: 1.8 10*3/uL (ref 0.7–4.0)
MCH: 31.9 pg (ref 26.0–34.0)
MCHC: 33.4 g/dL (ref 30.0–36.0)
MCV: 95.5 fL (ref 78.0–100.0)
MONO ABS: 0.3 10*3/uL (ref 0.1–1.0)
Monocytes Relative: 7 %
Neutro Abs: 2.7 10*3/uL (ref 1.7–7.7)
Neutrophils Relative %: 54 %
PLATELETS: 209 10*3/uL (ref 150–400)
RBC: 3.98 MIL/uL (ref 3.87–5.11)
RDW: 14.2 % (ref 11.5–15.5)
WBC: 5 10*3/uL (ref 4.0–10.5)

## 2015-09-16 MED ORDER — HYDROMORPHONE HCL 1 MG/ML IJ SOLN
1.0000 mg | Freq: Once | INTRAMUSCULAR | Status: AC
  Administered 2015-09-16: 1 mg via INTRAMUSCULAR
  Filled 2015-09-16: qty 1

## 2015-09-16 MED ORDER — OXYCODONE-ACETAMINOPHEN 5-325 MG PO TABS: 1.0000 | ORAL_TABLET | Freq: Four times a day (QID) | ORAL | Status: DC | PRN

## 2015-09-16 MED ORDER — HYDROMORPHONE HCL 2 MG/ML IJ SOLN: 2.0000 mg | Freq: Once | INTRAMUSCULAR | Status: DC

## 2015-09-16 MED ORDER — OXYCODONE-ACETAMINOPHEN 5-325 MG PO TABS
2.0000 | ORAL_TABLET | Freq: Once | ORAL | Status: AC
  Administered 2015-09-16: 2 via ORAL
  Filled 2015-09-16: qty 2

## 2015-09-16 MED ORDER — ALBUTEROL SULFATE HFA 108 (90 BASE) MCG/ACT IN AERS
2.0000 | INHALATION_SPRAY | RESPIRATORY_TRACT | Status: DC | PRN
  Administered 2015-09-16: 2 via RESPIRATORY_TRACT
  Filled 2015-09-16: qty 6.7

## 2015-09-16 NOTE — Discharge Instructions (Signed)
Please read and follow all provided instructions.  Your diagnoses today include:  1. Pain of right upper extremity   2. Hemiparesis ([place])   3. Cough    Tests performed today include:  Blood counts and electrolytes  Chest x-ray - no pneumonia  X-rays of right arm - no broken bones  Vital signs. See below for your results today.   Medications prescribed:   Percocet (oxycodone/acetaminophen) - narcotic pain medication  DO NOT drive or perform any activities that require you to be awake and alert because this medicine can make you drowsy. BE VERY CAREFUL not to take multiple medicines containing Tylenol (also called acetaminophen). Doing so can lead to an overdose which can damage your liver and cause liver failure and possibly death.   Albuterol inhaler - medication that opens up your airway  Use inhaler as follows: 1-2 puffs with spacer every 4 hours as needed for wheezing, cough, or shortness of breath.   Take any prescribed medications only as directed.  Home care instructions:  Follow any educational materials contained in this packet.  BE VERY CAREFUL not to take multiple medicines containing Tylenol (also called acetaminophen). Doing so can lead to an overdose which can damage your liver and cause liver failure and possibly death.   Follow-up instructions: Please follow-up with your primary care provider in the next 3 days for further evaluation of your symptoms.   Return instructions:   Please return to the Emergency Department if you experience worsening symptoms.   Please return if you have any other emergent concerns.  Additional Information:  Your vital signs today were: BP 115/63 mmHg   Pulse 89   Temp(Src) 98.1 F (36.7 C) (Oral)   Resp 15   SpO2 95%   LMP 05/24/2003 If your blood pressure (BP) was elevated above 135/85 this visit, please have this repeated by your doctor within one month. --------------

## 2015-09-16 NOTE — ED Provider Notes (Signed)
CSN: [id number]     Arrival date & time 09/16/15  1252 History   First MD Initiated Contact with Patient 09/16/15 1303     Chief Complaint  Patient presents with  . 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 Patient has had intermittent right arm pain since her stroke. This was more severe last night and today prompting emergency department visit. Daughter states that symptoms have not been well controlled at home patient's chronic pain medications. No color change or swelling of the upper extremity. In addition, daughter has noted twitching of the lips today that is abnormal. No other extremity shaking or loss of consciousness or other seizure-like activity. Patient is on [place]. Patient also has productive cough without fever or URI symptoms. Patient typically has a baseline "smoker's cough" per the daughter. Last night patient is also complaining of tingling in her left arm however did not have any associated weakness. The onset of this condition was acute. The course is constant. Aggravating factors: none. Alleviating factors: none.    The history is provided by the patient and medical records.    Past Medical History  Diagnosis Date  . Hypertension   . Asthma   . COPD (chronic obstructive pulmonary disease) ([place])   . Anxiety   . Colon cancer ([place])     a. s/p surgery, chemotherapy.  . Dyslipidemia   . Migraine headache   . Chronic bronchitis   . Uterine fibroid   . Ovarian cyst   . GERD (gastroesophageal reflux disease)   . Morbid obesity ([place])   . Obstructive sleep apnea     mild  . Chronic pain   .  Chronic back pain   . Arthritis   . Osteoarthritis   . Depression   . DVT (deep venous thrombosis) ([place]) 02/2014  . 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 PCCM recommended lifelong anticoagulation.  . Tobacco abuse   . PVC's (premature ventricular contractions) 2013  . Aortic stenosis     a. mild by echo 05/2015.  . Stroke [facility])    Past Surgical History  Procedure Laterality Date  . Knee arthroscopy      bil.  . Carpal tunnel release      rt  . Mouth surgery      teeth extraction  . Cesarean section    . Subtotal colectomy    . Colonoscopy    . Tubal ligation     Family History  Problem Relation Age of Onset  . Uterine cancer Mother   . Stroke Father   . Colon cancer Maternal Uncle   . Diabetes Father   . Ovarian cancer Mother   . Hypertension Father   . Breast cancer Maternal Grandmother   . Diabetes Sister   . Asthma Child   . Asthma Child    Social History  Substance Use Topics  . Smoking status: Current Some Day Smoker -- 1.00 packs/day for 37 years    Types: Cigarettes  . Smokeless tobacco: Never Used     Comment: trying to quit, down to .5ppd X2 years ago.   . Alcohol Use: No   OB History    Gravida Para Term Preterm  AB TAB SAB Ectopic Multiple Living   2 2 2       2      Review of Systems  Constitutional: Negative for fever.  HENT: Negative for congestion, dental problem, rhinorrhea and sinus pressure.   Eyes: Negative for photophobia, discharge, redness and visual disturbance.  Respiratory: Positive for cough. Negative for shortness of breath.   Cardiovascular: Negative for chest pain.  Gastrointestinal: Negative for nausea and vomiting.  Musculoskeletal: Positive for myalgias (R arm pain). Negative for gait problem, neck pain and neck stiffness.  Skin: Negative for rash.  Neurological: Positive for speech difficulty (at baseline and unchanged), weakness (R-sided, unchanged) and numbness (tingling). Negative for syncope, light-headedness and  headaches.  Psychiatric/Behavioral: Negative for confusion.    Allergies  Kiwi extract and Aspirin  Home Medications   Prior to Admission medications   Medication Sig Start Date End Date Taking? Authorizing Provider  acetaminophen (TYLENOL) 500 MG tablet Take 500 mg by mouth every 6 (six) hours as needed for headache.    Historical Provider, MD  albuterol (PROVENTIL HFA;VENTOLIN HFA) 108 (90 BASE) MCG/ACT inhaler Inhale 2 puffs into the lungs every 6 (six) hours as needed for wheezing or shortness of breath (shortness of breath). 02/20/14   Oswald Hillock, MD  atorvastatin (LIPITOR) 40 MG tablet Take 1 tablet (40 mg total) by mouth daily at 6 PM. 06/08/15   Ivan Anchors Love, PA-C  azithromycin (ZITHROMAX) 250 MG tablet Take 1 tablet (250 mg total) by mouth daily. Take first 2 tablets together, then 1 every day until finished. 08/09/15   Comer Locket, PA-C  citalopram (CELEXA) 20 MG tablet Take 1 tablet (20 mg total) by mouth daily. 06/08/15   Bary Leriche, PA-C  gabapentin (NEURONTIN) 300 MG capsule Take 1 capsule (300 mg total) by mouth 2 (two) times daily. 06/08/15   Bary Leriche, PA-C  guaiFENesin (ROBITUSSIN) 100 MG/5ML liquid Take 5-10 mLs (100-200 mg total) by mouth every 4 (four) hours as needed for cough. 08/09/15   Comer Locket, PA-C  hydrochlorothiazide (MICROZIDE) 12.5 MG capsule Take 1 capsule by mouth daily. 06/08/15   Historical Provider, MD  hydrOXYzine (ATARAX/VISTARIL) 25 MG tablet Take 1 tablet (25 mg total) by mouth 3 (three) times daily as needed. Patient not taking: Reported on 08/09/2015 06/20/15   Arnoldo Morale, MD  ipratropium-albuterol (DUONEB) 0.5-2.5 (3) MG/3ML SOLN Take 3 mLs by nebulization every 4 (four) hours. 08/09/15   Comer Locket, PA-C  levETIRAcetam (KEPPRA) 500 MG tablet Take 1 tablet (500 mg total) by mouth 2 (two) times daily. 06/09/15   Virgel Manifold, MD  LORazepam (ATIVAN) 1 MG tablet Take 1 tablet (1 mg total) by mouth every 6 (six) hours as needed for  anxiety (anxiety). 08/25/14   Lorayne Marek, MD  Menthol-Methyl Salicylate (GRX ANALGESIC BALM) OINT Apply 1 application topically 2 (two) times daily as needed. Patient not taking: Reported on 06/20/2015 06/08/15   Ivan Anchors Love, PA-C  omeprazole (PRILOSEC) 40 MG capsule Take 40 mg by mouth daily.    Historical Provider, MD  oxyCODONE (OXY IR/ROXICODONE) 5 MG immediate release tablet Take 1 tablet (5 mg total) by mouth every 12 (twelve) hours as needed for severe pain. Patient not taking: Reported on 06/20/2015 06/08/15   Ivan Anchors Love, PA-C  predniSONE (DELTASONE) 20 MG tablet 3 tabs po day one, then 2 po daily x 4 days 08/09/15   Comer Locket, PA-C  XARELTO 20 MG TABS tablet TAKE ONE TABLET BY MOUTH ONCE DAILY  IN THE EVENING WITH SUPPER 08/08/15   Charlett Blake, MD   BP 143/78 mmHg  Pulse 97  Temp(Src) 98.1 F (36.7 C) (Oral)  Resp 15  SpO2 100%  LMP 05/24/2003   Physical Exam  Constitutional: She is oriented to person, place, and time. She appears well-developed and well-nourished.  HENT:  Head: Normocephalic and atraumatic.  Right Ear: Tympanic membrane, external ear and ear canal normal.  Left Ear: Tympanic membrane, external ear and ear canal normal.  Nose: Nose normal.  Mouth/Throat: Uvula is midline, oropharynx is clear and moist and mucous membranes are normal.  Eyes: Conjunctivae, EOM and lids are normal. Pupils are equal, round, and reactive to light. Right eye exhibits no nystagmus. Left eye exhibits no nystagmus.  Neck: Normal range of motion. Neck supple.  Cardiovascular: Normal rate and regular rhythm.   Pulses:      Radial pulses are 2+ on the right side, and 2+ on the left side.  Pulmonary/Chest: Effort normal and breath sounds normal.  Abdominal: Soft. There is no tenderness.  Musculoskeletal: She exhibits tenderness. She exhibits no edema.       Right shoulder: Normal. She exhibits no tenderness.       Right elbow: Tenderness found.       Right wrist: She  exhibits tenderness.       Cervical back: She exhibits normal range of motion, no tenderness and no bony tenderness.       Right upper arm: She exhibits tenderness.       Right forearm: She exhibits tenderness.       Right hand: Normal.  Patient has tenderness with palpation or manipulation of any part of her right upper extremity from her right wrist to her right shoulder. No swelling, color change, redness or erythema.  Neurological: She is alert and oriented to person, place, and time. She has normal strength and normal reflexes. No cranial nerve deficit or sensory deficit. She displays a negative Romberg sign. Coordination and gait normal. GCS eye subscore is 4. GCS verbal subscore is 5. GCS motor subscore is 6.  Skin: Skin is warm and dry.  Psychiatric: She has a normal mood and affect.  Nursing note and vitals reviewed.   ED Course  Procedures (including critical care time) Labs Review Labs Reviewed  BASIC METABOLIC PANEL - Abnormal; Notable for the following:    Glucose, Bld 112 (*)    Calcium 8.6 (*)    All other components within normal limits  CBC WITH DIFFERENTIAL/PLATELET    Imaging Review Dg Chest 2 View  09/16/2015  CLINICAL DATA:  Cough EXAM: CHEST  2 VIEW COMPARISON:  08/09/2015 FINDINGS: Cardiac shadow is at the upper limits of normal in size but stable. The lungs are well aerated bilaterally. No focal infiltrate or sizable effusion is seen. Mild degenerative changes of the thoracic spine are noted. IMPRESSION: No active cardiopulmonary disease. Electronically Signed   By: Inez Catalina M.D.   On: 09/16/2015 14:16   Dg Forearm Right  09/16/2015  CLINICAL DATA:  Right forearm pain, no known injury, initial encounter EXAM: RIGHT FOREARM - 2 VIEW COMPARISON:  None. FINDINGS: There is no evidence of fracture or other focal bone lesions. Soft tissues are unremarkable. IMPRESSION: No acute abnormality noted. Electronically Signed   By: Inez Catalina M.D.   On: 09/16/2015 14:17    I have personally reviewed and evaluated these images and lab results as part of my medical decision-making.   EKG Interpretation  Date/Time:  Sunday September 16 2015 13:26:12 EDT Ventricular Rate:  93 PR Interval:  150 QRS Duration: 76 QT Interval:  344 QTC Calculation: 428 R Axis:   -4 Text Interpretation:  Sinus rhythm Ventricular trigeminy occasional PVCS  otherwise no significant change Confirmed by LITTLE MD, RACHEL [id number]) on  09/16/2015 1:59:13 PM       1:25 PM Patient seen and examined. Discussed with Dr. Rex Kras who will see.   Vital signs reviewed and are as follows: BP 143/78 mmHg  Pulse 97  Temp(Src) 98.1 F (36.7 C) (Oral)  Resp 15  SpO2 100%  LMP 05/24/2003  4:30 PM Patient seen previously by Dr. Rex Kras. Patient has received IM dilaudid and PO percocet.   No indications for admission. This appears to be a pain control issue. Patient requests albuterol inhaler for home. Encouraged PCP f/u this week.    MDM   Final diagnoses:  Pain of right upper extremity  Hemiparesis (HCC)  Cough   Upper extremity pain: Likely related to previous stroke. This is a intermittent, chronic issue. Patient provided with pain control in emergency department. Patient is anticoagulated, arm does not appear consistent with a upper extremity DVT. Imaging is negative.  Hemiparesis and aphasia: From previous stroke, unchanged. Patient has no left sided sensory deficit on exam here.  Cough: Patient with history of COPD, negative x-ray. Low suspicion for pneumonia or PE. Patient is compliant with Xarelto.   Carlisle Cater, PA-C 09/16/15 [place], MD 09/17/15 [phone]

## 2015-09-16 NOTE — Telephone Encounter (Signed)
Contacted by Colletta [place] at [facility] who states that patient is presenting with prescription for Percocet 8 tabs writtten by Alecia Lemming, PA.  Patient had filled Percocet 90 tabs on 09/05/2015.  Discussed with Dr. Zenia Resides who advises do not fill prescription for Percocet 8 tabs.

## 2015-09-16 NOTE — ED Notes (Signed)
She is brought in by her daughter who tells [place] "Her (pt's.) right arm is hurting".  Pt. Is in no distress.  She has marked expressive aphasia from prior cva which her daughter states is unchanged.

## 2015-09-16 NOTE — ED Notes (Signed)
Pt c/o pain in R arm and numbness in L fingers. Pt also c/o cough and chest congestion.

## 2015-09-17 ENCOUNTER — Other Ambulatory Visit: Payer: Self-pay | Admitting: Internal Medicine

## 2015-10-06 ENCOUNTER — Other Ambulatory Visit: Payer: Self-pay | Admitting: Physical Medicine & Rehabilitation

## 2015-10-15 ENCOUNTER — Emergency Department (HOSPITAL_COMMUNITY)
Admission: EM | Admit: 2015-10-15 | Discharge: 2015-10-16 | Disposition: A | Discharge status: Home / Self Care | Payer: Medicare HMO | Attending: Emergency Medicine | Admitting: Emergency Medicine

## 2015-10-15 ENCOUNTER — Encounter (HOSPITAL_COMMUNITY): Payer: Self-pay | Admitting: Emergency Medicine

## 2015-10-15 ENCOUNTER — Emergency Department (HOSPITAL_COMMUNITY): Payer: Medicare HMO

## 2015-10-15 DIAGNOSIS — M1990 Unspecified osteoarthritis, unspecified site: Secondary | ICD-10-CM | POA: Diagnosis not present

## 2015-10-15 DIAGNOSIS — G8929 Other chronic pain: Secondary | ICD-10-CM | POA: Diagnosis not present

## 2015-10-15 DIAGNOSIS — Z8742 Personal history of other diseases of the female genital tract: Secondary | ICD-10-CM | POA: Insufficient documentation

## 2015-10-15 DIAGNOSIS — J441 Chronic obstructive pulmonary disease with (acute) exacerbation: Secondary | ICD-10-CM | POA: Insufficient documentation

## 2015-10-15 DIAGNOSIS — Z85038 Personal history of other malignant neoplasm of large intestine: Secondary | ICD-10-CM | POA: Insufficient documentation

## 2015-10-15 DIAGNOSIS — R0602 Shortness of breath: Secondary | ICD-10-CM | POA: Diagnosis present

## 2015-10-15 DIAGNOSIS — Z86718 Personal history of other venous thrombosis and embolism: Secondary | ICD-10-CM | POA: Insufficient documentation

## 2015-10-15 DIAGNOSIS — Z86711 Personal history of pulmonary embolism: Secondary | ICD-10-CM | POA: Diagnosis not present

## 2015-10-15 DIAGNOSIS — Z79899 Other long term (current) drug therapy: Secondary | ICD-10-CM | POA: Insufficient documentation

## 2015-10-15 DIAGNOSIS — F329 Major depressive disorder, single episode, unspecified: Secondary | ICD-10-CM | POA: Diagnosis not present

## 2015-10-15 DIAGNOSIS — Z86018 Personal history of other benign neoplasm: Secondary | ICD-10-CM | POA: Diagnosis not present

## 2015-10-15 DIAGNOSIS — F419 Anxiety disorder, unspecified: Secondary | ICD-10-CM | POA: Diagnosis not present

## 2015-10-15 DIAGNOSIS — I10 Essential (primary) hypertension: Secondary | ICD-10-CM | POA: Insufficient documentation

## 2015-10-15 DIAGNOSIS — K219 Gastro-esophageal reflux disease without esophagitis: Secondary | ICD-10-CM | POA: Diagnosis not present

## 2015-10-15 DIAGNOSIS — F17210 Nicotine dependence, cigarettes, uncomplicated: Secondary | ICD-10-CM | POA: Diagnosis not present

## 2015-10-15 DIAGNOSIS — Z8673 Personal history of transient ischemic attack (TIA), and cerebral infarction without residual deficits: Secondary | ICD-10-CM | POA: Diagnosis not present

## 2015-10-15 DIAGNOSIS — Z7901 Long term (current) use of anticoagulants: Secondary | ICD-10-CM | POA: Insufficient documentation

## 2015-10-15 MED ORDER — ALBUTEROL SULFATE (2.5 MG/3ML) 0.083% IN NEBU: 5.0000 mg | INHALATION_SOLUTION | Freq: Once | RESPIRATORY_TRACT | Status: DC

## 2015-10-15 NOTE — ED Provider Notes (Signed)
CSN: [id number]     Arrival date & time 10/15/15  1603 History  By signing my name below, I, Sonum Patel, attest that this documentation has been prepared under the direction and in the presence of Sharlett Iles, MD. Electronically Signed: Sonum Patel, Education administrator. 10/15/2015. 11:59 PM.    Chief Complaint  Patient presents with  . Shortness of Breath   The history is provided by the patient. No language interpreter was used.     HPI Comments: Brooke [name] is a 60 y.o. female who presents to the Emergency Department complaining of a chronic cough with associated SOB that has progressively worsened over the last week. She reports associated rhinorrhea. Daughter states patient has a history of "borderline" COPD and has had similar episodes in the past, although she is not on inhalers at home. She is currently taking Xarelto and states she is compliant. She denies sore throat, fever. She denies sick contacts at home. She denies history of heart failure. She denies changes in daily medications. She is a daily smoker.   Past Medical History  Diagnosis Date  . Hypertension   . Asthma   . COPD (chronic obstructive pulmonary disease) ([place])   . Anxiety   . Colon cancer ([place])     a. s/p surgery, chemotherapy.  . Dyslipidemia   . Migraine headache   . Chronic bronchitis   . Uterine fibroid   . Ovarian cyst   . GERD (gastroesophageal reflux disease)   . Morbid obesity ([place])   . Obstructive sleep apnea     mild  . Chronic pain   . Chronic back pain   . Arthritis   . Osteoarthritis   . Depression   . DVT (deep venous thrombosis) ([place]) 02/2014  . 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 PCCM recommended lifelong anticoagulation.  . Tobacco abuse   . PVC's (premature ventricular contractions) 2013  . Aortic stenosis     a. mild by echo 05/2015.  . Stroke [facility])    Past Surgical History  Procedure Laterality Date  . Knee arthroscopy      bil.  . Carpal tunnel release      rt   . Mouth surgery      teeth extraction  . Cesarean section    . Subtotal colectomy    . Colonoscopy    . Tubal ligation     Family History  Problem Relation Age of Onset  . Uterine cancer Mother   . Stroke Father   . Colon cancer Maternal Uncle   . Diabetes Father   . Ovarian cancer Mother   . Hypertension Father   . Breast cancer Maternal Grandmother   . Diabetes Sister   . Asthma Child   . Asthma Child    Social History  Substance Use Topics  . Smoking status: Current Some Day Smoker -- 1.00 packs/day for 37 years    Types: Cigarettes  . Smokeless tobacco: Never Used     Comment: trying to quit, down to .5ppd X2 years ago.   . Alcohol Use: No   OB History    Gravida Para Term Preterm AB TAB SAB Ectopic Multiple Living   2 2 2       2      Review of Systems  10 Systems reviewed and all are negative for acute change except as noted in the HPI.   Allergies  Kiwi extract and Aspirin  Home Medications   Prior to Admission medications  Medication Sig Start Date End Date Taking? Authorizing Provider  acetaminophen (TYLENOL) 500 MG tablet Take 500 mg by mouth every 6 (six) hours as needed for headache.   Yes Historical Provider, MD  albuterol (PROVENTIL HFA;VENTOLIN HFA) 108 (90 BASE) MCG/ACT inhaler Inhale 2 puffs into the lungs every 6 (six) hours as needed for wheezing or shortness of breath (shortness of breath). 02/20/14  Yes Oswald Hillock, MD  citalopram (CELEXA) 20 MG tablet Take 1 tablet (20 mg total) by mouth daily. 06/08/15  Yes Ivan Anchors Love, PA-C  hydrochlorothiazide (MICROZIDE) 12.5 MG capsule Take 1 capsule by mouth daily. 06/08/15  Yes Historical Provider, MD  levETIRAcetam (KEPPRA) 500 MG tablet Take 1 tablet (500 mg total) by mouth 2 (two) times daily. 06/09/15  Yes Virgel Manifold, MD  omeprazole (PRILOSEC) 40 MG capsule TAKE 1 CAPSULE EVERY DAY 09/17/15  Yes Arnoldo Morale, MD  potassium chloride SA (K-DUR,KLOR-CON) 20 MEQ tablet Take 20 mEq by mouth daily.    Yes Historical Provider, MD  Tapentadol HCl (NUCYNTA) 100 MG TABS Take 100 mg by mouth 2 times daily at 12 noon and 4 pm.   Yes Historical Provider, MD  XARELTO 20 MG TABS tablet TAKE ONE TABLET BY MOUTH ONCE DAILY IN THE EVENING WITH SUPPER 08/08/15  Yes Charlett Blake, MD  atorvastatin (LIPITOR) 40 MG tablet Take 1 tablet (40 mg total) by mouth daily at 6 PM. Patient not taking: Reported on 10/15/2015 06/08/15   Ivan Anchors Love, PA-C  benzonatate (TESSALON) 100 MG capsule Take 1 capsule (100 mg total) by mouth 3 (three) times daily as needed for cough. 10/16/15   Sharlett Iles, MD  gabapentin (NEURONTIN) 300 MG capsule Take 1 capsule (300 mg total) by mouth 2 (two) times daily. Patient not taking: Reported on 10/15/2015 06/08/15   Ivan Anchors Love, PA-C  LORazepam (ATIVAN) 1 MG tablet Take 1 tablet (1 mg total) by mouth every 6 (six) hours as needed for anxiety (anxiety). Patient not taking: Reported on 10/15/2015 08/25/14   Lorayne Marek, MD  oxyCODONE-acetaminophen (PERCOCET/ROXICET) 5-325 MG tablet Take 1-2 tablets by mouth every 6 (six) hours as needed for severe pain. Patient not taking: Reported on 10/15/2015 09/16/15   Carlisle Cater, PA-C  predniSONE (DELTASONE) 20 MG tablet Take 2 tablets (40 mg total) by mouth daily. 10/16/15   Wenda Overland Maki Hege, MD   BP 164/115 mmHg  Pulse 85  Temp(Src) 98 F (36.7 C) (Oral)  Resp 17  SpO2 100%  LMP 05/24/2003 Physical Exam  Constitutional: She is oriented to person, place, and time. She appears well-developed and well-nourished. She appears distressed.  Sitting on edge of bed, crying and anxious  HENT:  Head: Normocephalic and atraumatic.  Moist mucous membranes  Eyes: Conjunctivae are normal. Pupils are equal, round, and reactive to light.  Neck: Neck supple.  Cardiovascular: Normal rate, regular rhythm and normal heart sounds.   No murmur heard. Pulmonary/Chest: Effort normal and breath sounds normal.  Frequent cough, no obvious wheezing or  crackles  Abdominal: Soft. Bowel sounds are normal. She exhibits no distension. There is no tenderness.  Musculoskeletal: She exhibits edema.  Mild BLE edema  Neurological: She is alert and oriented to person, place, and time.  Fluent speech  Skin: Skin is warm and dry.  Psychiatric:  Distressed, anxious, tearful  Nursing note and vitals reviewed.   ED Course  Procedures (including critical care time)  DIAGNOSTIC STUDIES: Oxygen Saturation is 100% on RA, normal by my interpretation.  COORDINATION OF CARE: 12:08 AM Discussed treatment plan with pt at bedside and pt agreed to plan.   Labs Review Labs Reviewed  BASIC METABOLIC PANEL - Abnormal; Notable for the following:    Glucose, Bld 103 (*)    All other components within normal limits  CBC WITH DIFFERENTIAL/PLATELET  BRAIN NATRIURETIC PEPTIDE  I-STAT TROPOININ, ED    Imaging Review Dg Chest 2 View  10/15/2015  CLINICAL DATA:  Cough, shortness of Breath EXAM: CHEST  2 VIEW COMPARISON:  09/16/2015 FINDINGS: Mild peribronchial thickening. Heart and mediastinal contours are within normal limits. No focal opacities or effusions. No acute bony abnormality. IMPRESSION: Mild bronchitic changes. Electronically Signed   By: Rolm Baptise M.D.   On: 10/15/2015 17:35   I have personally reviewed and evaluated these lab results as part of my medical decision-making.   EKG Interpretation   Date/Time:  Monday Oct 15 2015 16:23:47 EDT Ventricular Rate:  87 PR Interval:  141 QRS Duration: 72 QT Interval:  354 QTC Calculation: 426 R Axis:   12 Text Interpretation:  Sinus rhythm No significant change since last  tracing Confirmed by Wilson Singer  MD, STEPHEN ([id number]) on 10/15/2015 4:26:38 PM     Medications  albuterol (PROVENTIL) (2.5 MG/3ML) 0.083% nebulizer solution 5 mg (0 mg Nebulization Hold 10/15/15 1627)  predniSONE (DELTASONE) tablet 40 mg (not administered)  albuterol (PROVENTIL HFA;VENTOLIN HFA) 108 (90 Base) MCG/ACT inhaler 2 puff  (not administered)  aerochamber plus with mask device 1 each (not administered)  benzonatate (TESSALON) capsule 200 mg (not administered)  oxyCODONE-acetaminophen (PERCOCET/ROXICET) 5-325 MG per tablet 1 tablet (not administered)  ipratropium-albuterol (DUONEB) 0.5-2.5 (3) MG/3ML nebulizer solution 3 mL (3 mLs Nebulization Given 10/16/15 0104)  oxyCODONE-acetaminophen (PERCOCET/ROXICET) 5-325 MG per tablet 1 tablet (1 tablet Oral Given 10/16/15 0106)    MDM   Final diagnoses:  COPD exacerbation ([place])   Patient with history of chronic cough and tobacco use presents with 1 week of worse-than-baseline cough as well as increased shortness of breath. She does not use inhalers at home. On presentation, the patient was tearful, coughing frequently and anxious. No obvious wheezing on exam. Vital signs notable for mild hypertension, normal O2 saturation on room air. EKG unchanged from previous. Obtained chest x-ray which showed mild bronchitic changes but no obvious infiltrates. Basic lab work as well as troponin and BNP were normal. On reexamination after receiving DuoNeb, the patient's work of breathing had significantly improved and she did have wheezing bilaterally on expiration. I suspect COPD exacerbation. I considered PE but the patient is compliant with Xarelto and given the improvement with albuterol, I feel reactive airways is much more likely. Provided patient with albuterol inhaler as well as prednisone and Tessalon to use at home. The patient requested pain medication for her chronic pain; gave her Percocet but explained that I would not provide IM Dilaudid for her chronic pain. Instructed to follow-up with PCP in a few days to ensure improvement. Return precautions reviewed and the patient and her daughter voiced understanding. Patient discharged in satisfactory condition.  I personally performed the services described in this documentation, which was scribed in my presence. The recorded information  has been reviewed and is accurate.   Sharlett Iles, MD 10/16/15 [phone]

## 2015-10-15 NOTE — ED Notes (Signed)
Pt states that she has had SOB all day. Difficulty speaking due to recent CVA. States this is her baseline now. States she was taking breathing treatments at home and it hurts to breathe. Alert and oriented.

## 2015-10-16 LAB — CBC WITH DIFFERENTIAL/PLATELET
BASOS PCT: 0 %
Basophils Absolute: 0 10*3/uL (ref 0.0–0.1)
Eosinophils Absolute: 0.2 10*3/uL (ref 0.0–0.7)
Eosinophils Relative: 3 %
HEMATOCRIT: 41.9 % (ref 36.0–46.0)
Hemoglobin: 13.9 g/dL (ref 12.0–15.0)
Lymphocytes Relative: 33 %
Lymphs Abs: 1.9 10*3/uL (ref 0.7–4.0)
MCH: 32.2 pg (ref 26.0–34.0)
MCHC: 33.2 g/dL (ref 30.0–36.0)
MCV: 97 fL (ref 78.0–100.0)
MONO ABS: 0.5 10*3/uL (ref 0.1–1.0)
MONOS PCT: 9 %
NEUTROS ABS: 3.3 10*3/uL (ref 1.7–7.7)
Neutrophils Relative %: 55 %
Platelets: 188 10*3/uL (ref 150–400)
RBC: 4.32 MIL/uL (ref 3.87–5.11)
RDW: 14.6 % (ref 11.5–15.5)
WBC: 5.9 10*3/uL (ref 4.0–10.5)

## 2015-10-16 LAB — BASIC METABOLIC PANEL
ANION GAP: 9 (ref 5–15)
BUN: 9 mg/dL (ref 6–20)
CALCIUM: 9.3 mg/dL (ref 8.9–10.3)
CO2: 28 mmol/L (ref 22–32)
CREATININE: 0.83 mg/dL (ref 0.44–1.00)
Chloride: 101 mmol/L (ref 101–111)
GFR calc Af Amer: >60 mL/min (ref >60)
GFR calc non Af Amer: >60 mL/min (ref >60)
GLUCOSE: 103 mg/dL — AB (ref 65–99)
Potassium: 3.8 mmol/L (ref 3.5–5.1)
Sodium: 138 mmol/L (ref 135–145)

## 2015-10-16 LAB — I-STAT TROPONIN, ED: TROPONIN I, POC: 0 ng/mL (ref 0.00–0.08)

## 2015-10-16 LAB — BRAIN NATRIURETIC PEPTIDE: B Natriuretic Peptide: 11.9 pg/mL (ref 0.0–100.0)

## 2015-10-16 MED ORDER — OXYCODONE-ACETAMINOPHEN 5-325 MG PO TABS
1.0000 | ORAL_TABLET | Freq: Once | ORAL | Status: AC
  Administered 2015-10-16: 1 via ORAL
  Filled 2015-10-16: qty 1

## 2015-10-16 MED ORDER — PREDNISONE 20 MG PO TABS
40.0000 mg | ORAL_TABLET | Freq: Once | ORAL | Status: AC
  Administered 2015-10-16: 40 mg via ORAL
  Filled 2015-10-16: qty 2

## 2015-10-16 MED ORDER — ALBUTEROL SULFATE HFA 108 (90 BASE) MCG/ACT IN AERS
2.0000 | INHALATION_SPRAY | RESPIRATORY_TRACT | Status: DC | PRN
  Administered 2015-10-16: 2 via RESPIRATORY_TRACT
  Filled 2015-10-16: qty 6.7

## 2015-10-16 MED ORDER — BENZONATATE 100 MG PO CAPS: 100.0000 mg | ORAL_CAPSULE | Freq: Three times a day (TID) | ORAL | Status: DC | PRN

## 2015-10-16 MED ORDER — IPRATROPIUM-ALBUTEROL 0.5-2.5 (3) MG/3ML IN SOLN
3.0000 mL | Freq: Once | RESPIRATORY_TRACT | Status: AC
  Administered 2015-10-16: 3 mL via RESPIRATORY_TRACT
  Filled 2015-10-16: qty 3

## 2015-10-16 MED ORDER — PREDNISONE 20 MG PO TABS: 40.0000 mg | ORAL_TABLET | Freq: Every day | ORAL | Status: DC

## 2015-10-16 MED ORDER — BENZONATATE 100 MG PO CAPS
200.0000 mg | ORAL_CAPSULE | Freq: Once | ORAL | Status: AC
  Administered 2015-10-16: 200 mg via ORAL
  Filled 2015-10-16: qty 2

## 2015-10-16 MED ORDER — AEROCHAMBER PLUS W/MASK MISC
1.0000 | Freq: Once | Status: AC
  Administered 2015-10-16: 1
  Filled 2015-10-16: qty 1

## 2015-10-16 NOTE — Discharge Instructions (Signed)
IT IS VERY IMPORTANT FOR YOU TO STOP SMOKING.

## 2015-10-24 IMAGING — CR DG CHEST 2V
2 series · 2 of 2 positions shown · non-contrast
Comparison: 01/30/2014; 04/15/2012

CLINICAL DATA: Cough and wheezing for 3 days. History of asthma,
hypertension, COPD and colon cancer. History of smoking.

EXAM:
CHEST  2 VIEW

[w chest pa]
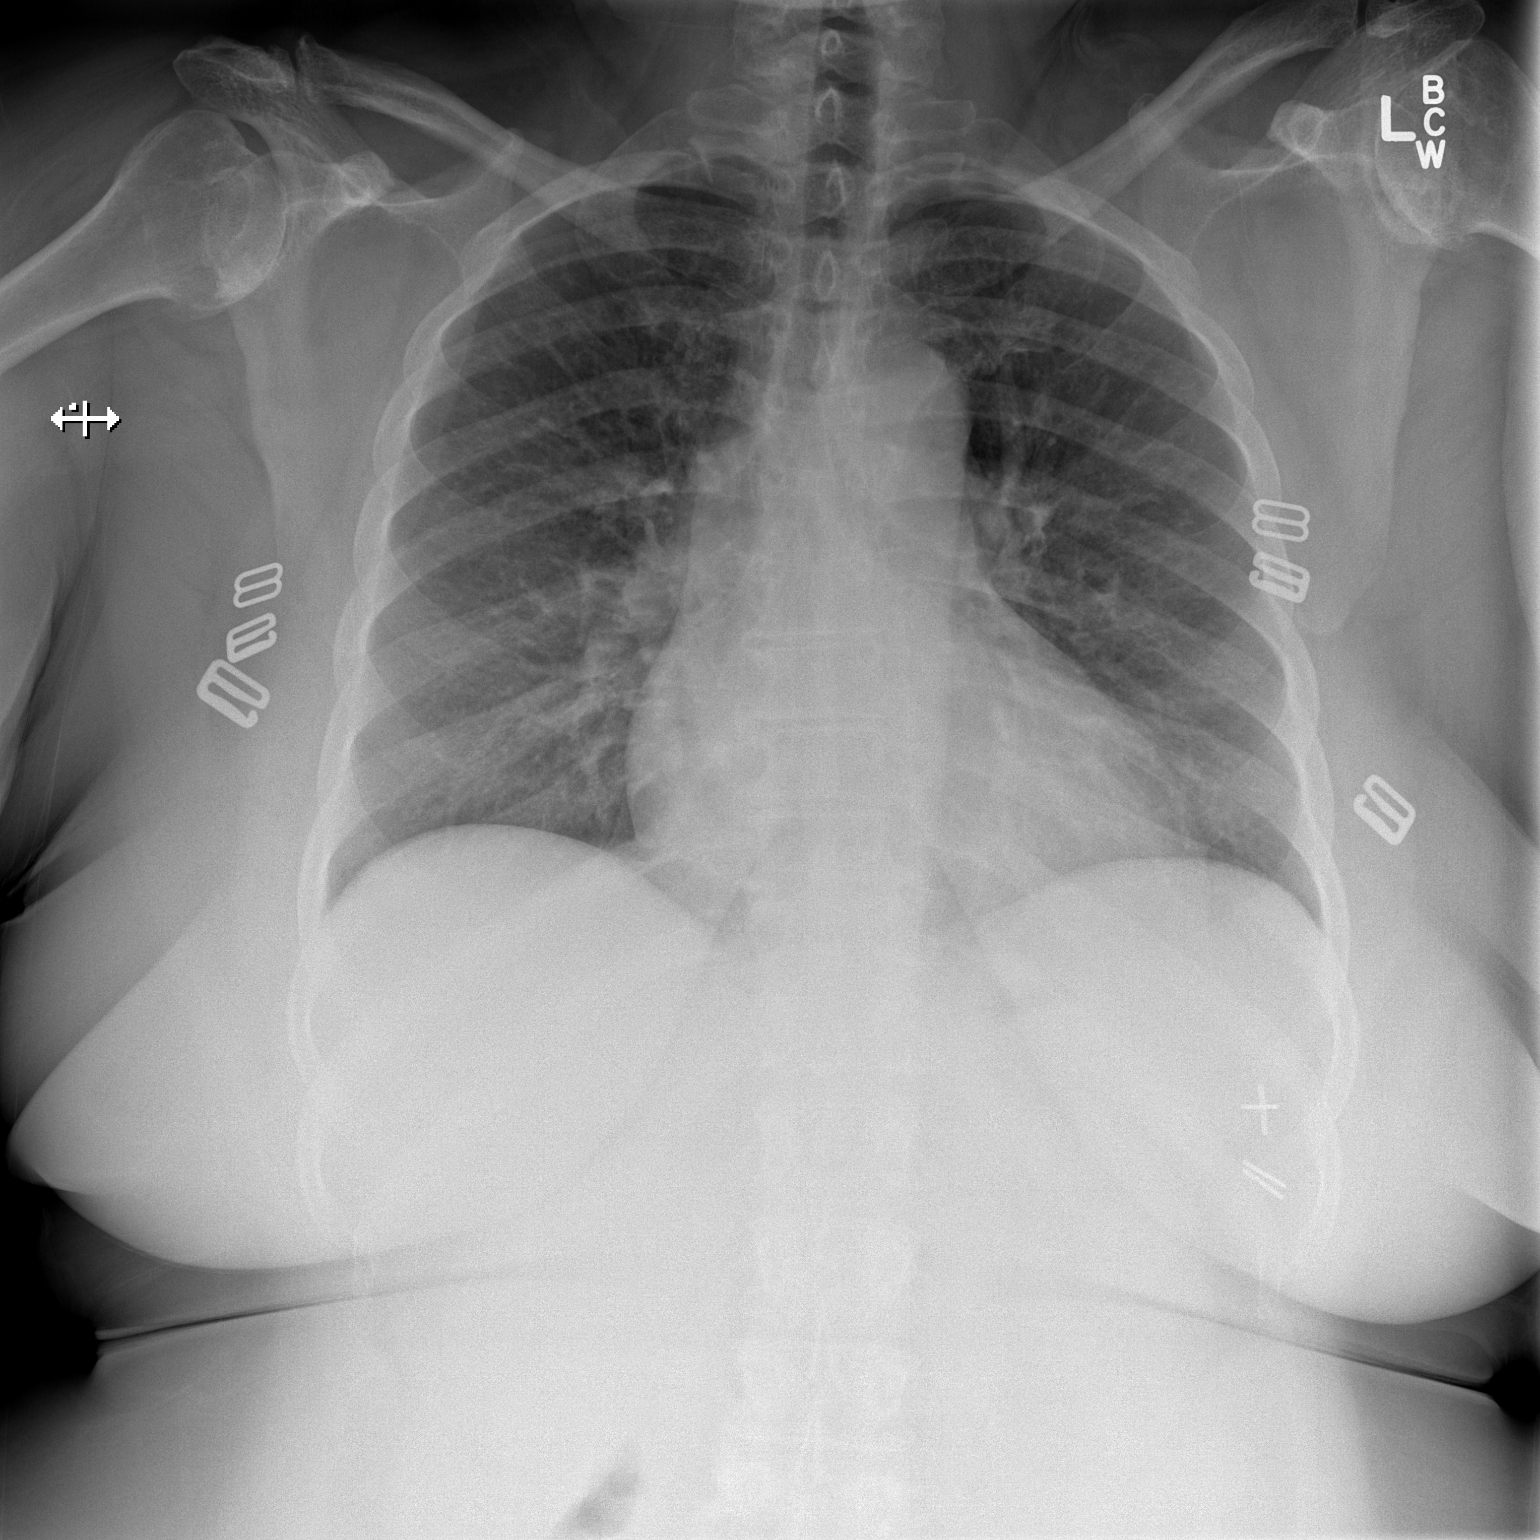

[w chest lat]
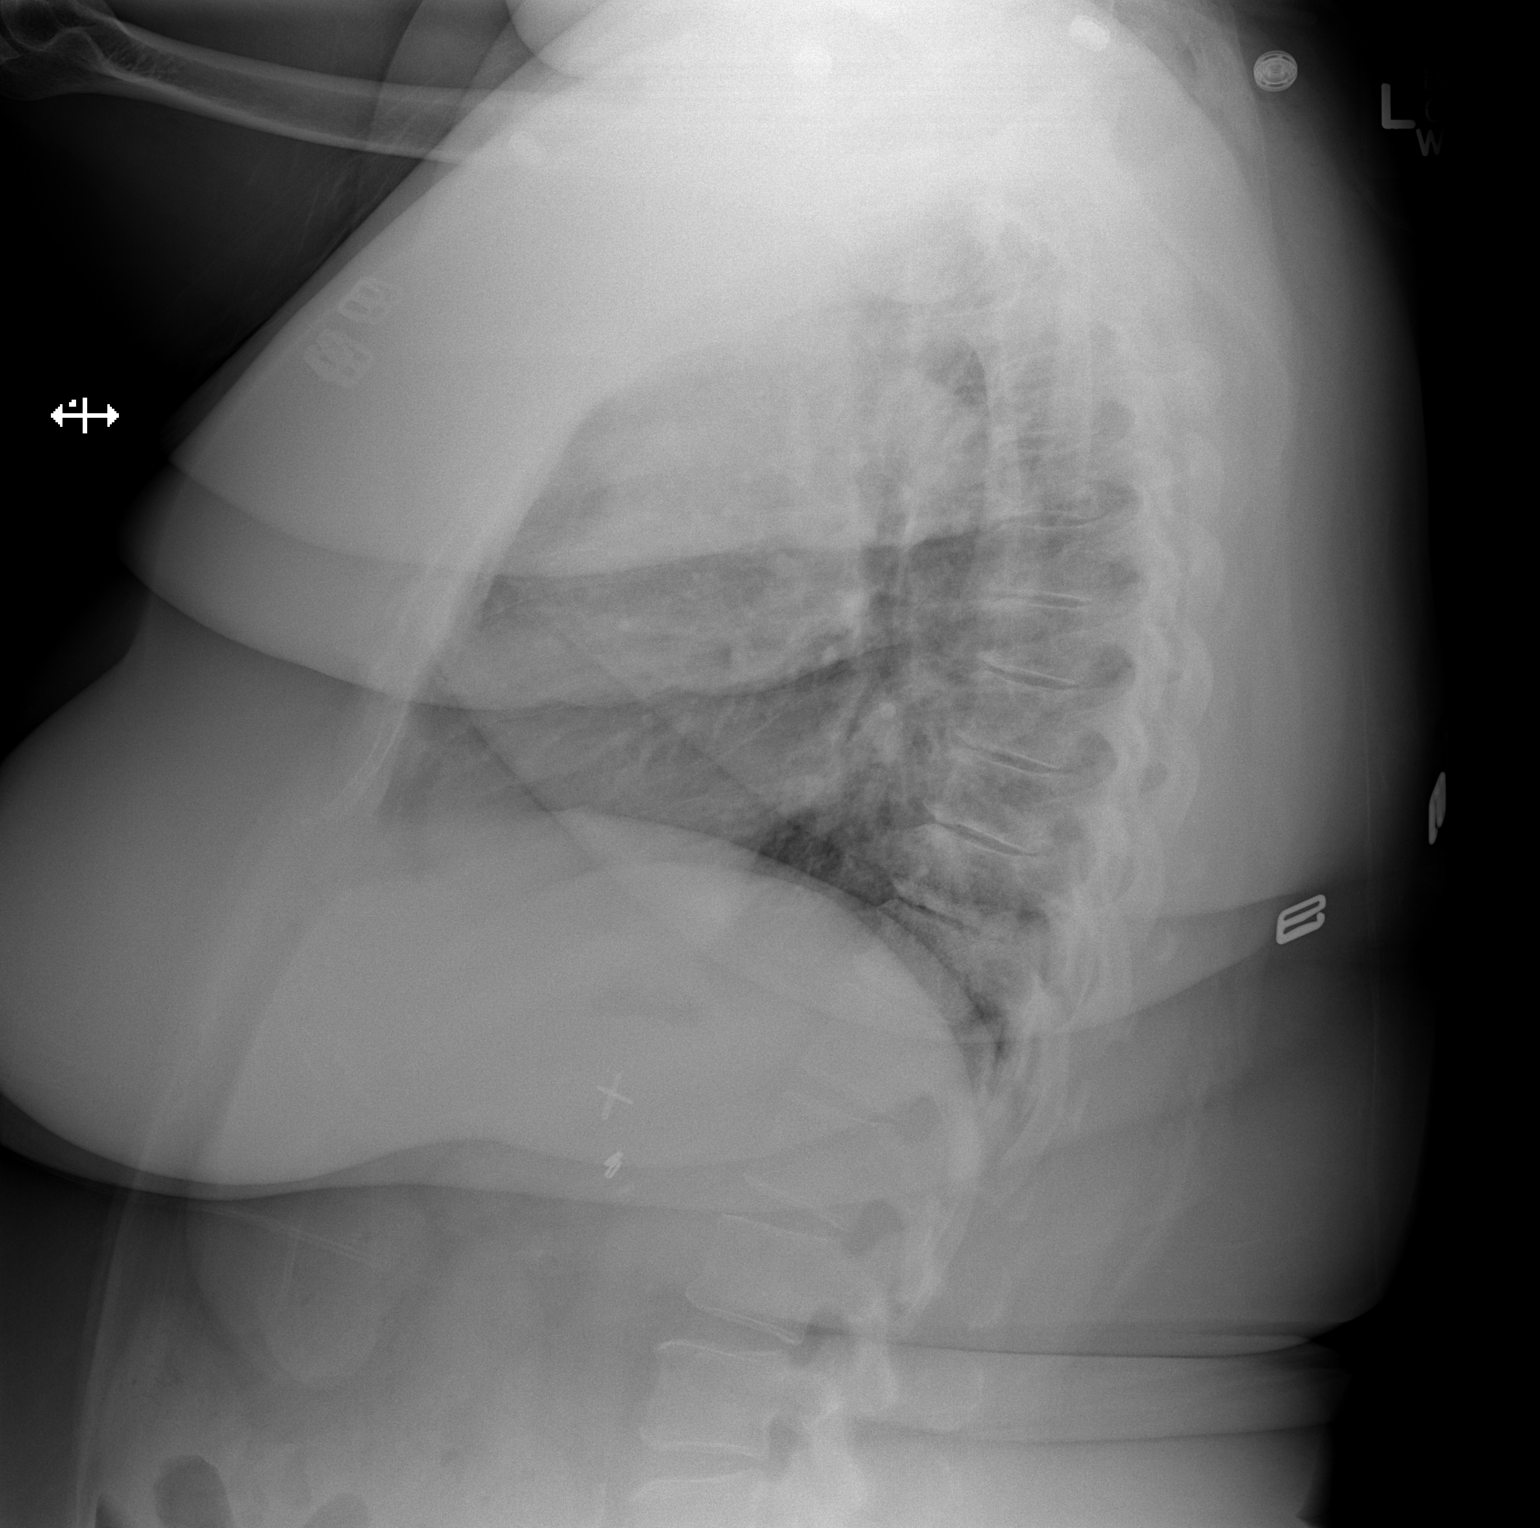

[2 of 2 positions shown; findings below may reference images not displayed]

FINDINGS: Grossly unchanged enlarged cardiac silhouette and mediastinal
contours. Evaluation of the retrosternal clear space is obscured
secondary to overlying soft tissues. The lungs are hyperexpanded
with unchanged mild diffuse slightly nodular thickening of the
pulmonary interstitium. No focal airspace opacities. No pleural
effusion or pneumothorax. No evidence of edema. Surgical clips are
seen within the left upper abdominal quadrant. Unchanged bones.
IMPRESSION: Mild lung hyperexpansion and bronchitic change without acute
cardiopulmonary disease.

## 2015-10-26 IMAGING — CR DG CHEST 1V PORT
1 series · 1 of 1 positions shown · non-contrast
Comparison: 02/12/2014; 01/10/2014; 04/20/2013

CLINICAL DATA: Severe shortness of breath. History of asthma, COPD,
colon cancer and hypertension

EXAM:
PORTABLE CHEST - 1 VIEW

[AP]
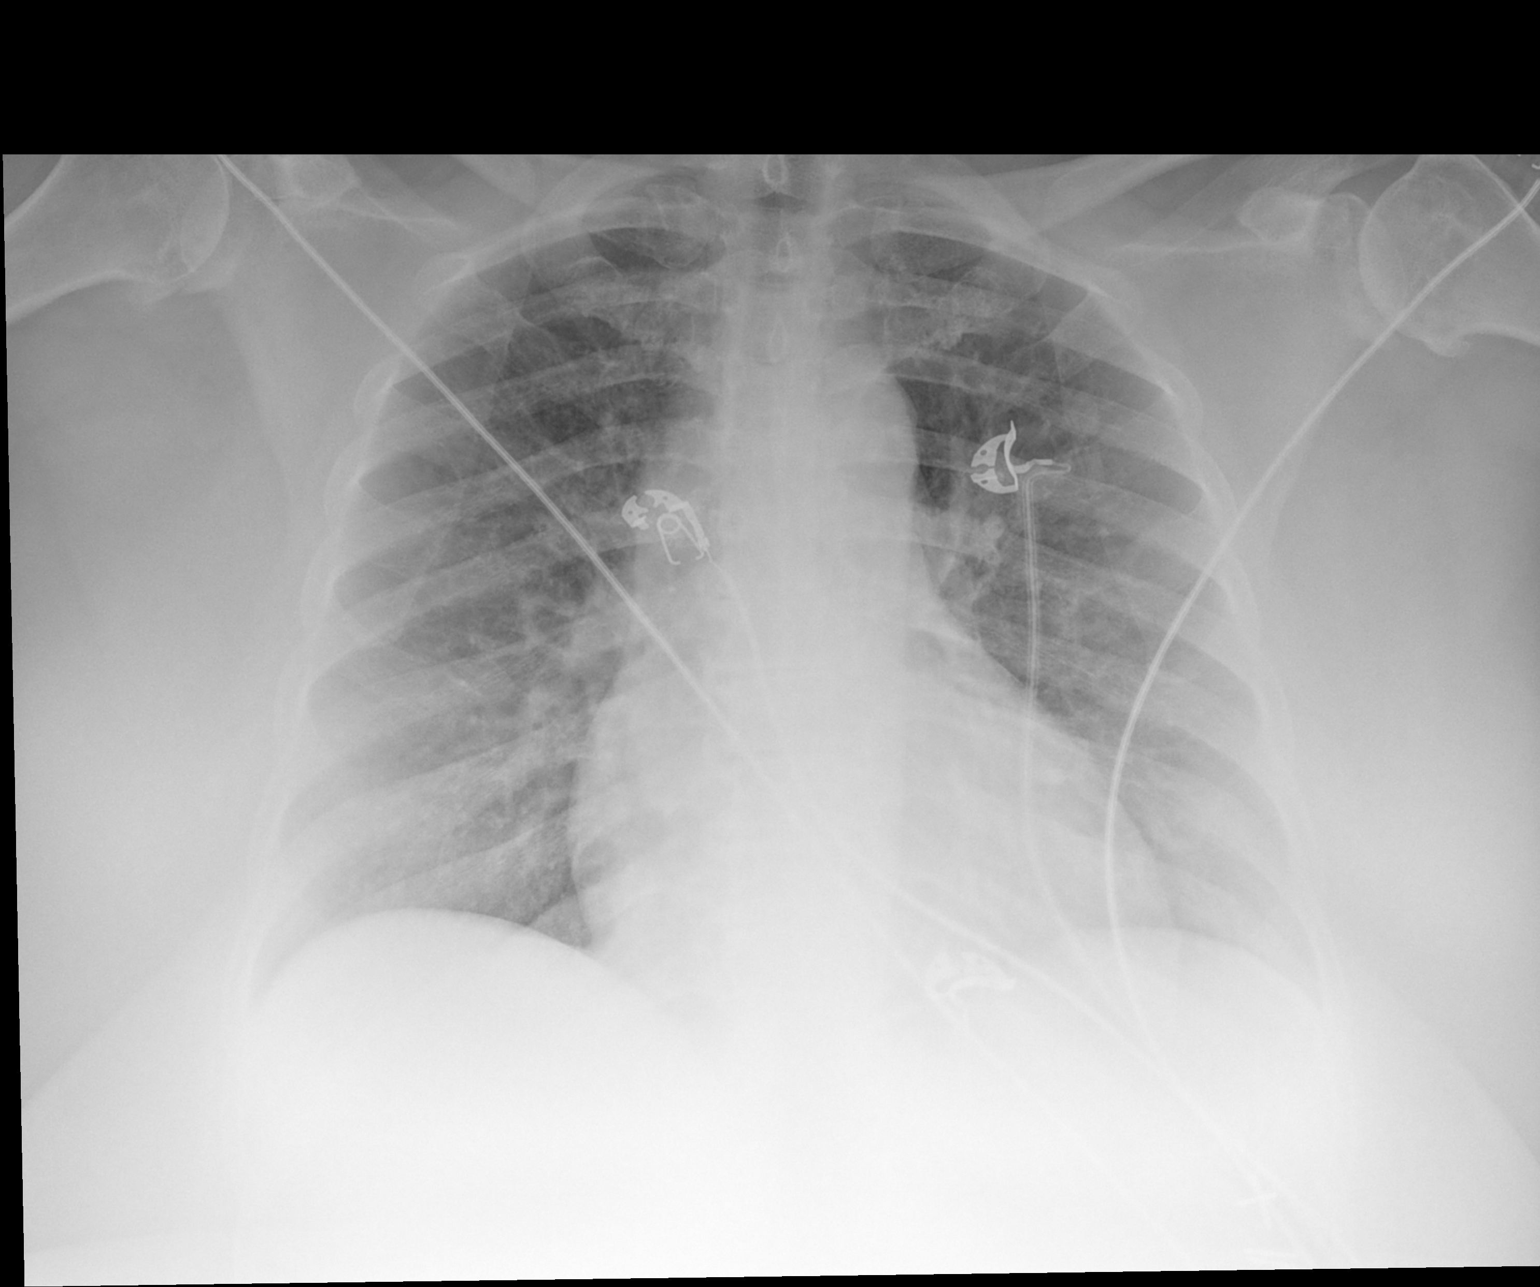

[1 of 1 positions shown; findings below may reference images not displayed]

FINDINGS: Examination is degraded due to patient body habitus and portable
technique.

Grossly unchanged enlarged cardiac silhouette and mediastinal
contours. There is grossly unchanged mild diffuse slightly nodular
thickening of the pulmonary interstitium. Veiling opacities
overlying the bilateral lower lungs are favored to represent
overlying breast tissues. No discrete focal airspace opacities. No
pleural effusion or pneumothorax. No evidence of edema. No acute
osseus abnormalities. At least moderate degenerative change of the
bilateral glenohumeral joints is suspected though incompletely
evaluated.
IMPRESSION: Findings suggestive of airways disease / bronchitis. No focal
airspace opacities to suggest pneumonia on this AP portable
examination.

## 2015-10-28 IMAGING — CT CT ANGIO CHEST
1 of 2 series · 19 of 32 positions shown · IV contrast (OMNIPAQUE 350)
Comparison: None.

CLINICAL DATA: Shortness of breath.  History of DVTs.  Chest pain.

EXAM:
CT ANGIOGRAPHY CHEST WITH CONTRAST
TECHNIQUE: Multidetector CT imaging of the chest was performed using the
standard protocol during bolus administration of intravenous
contrast. Multiplanar CT image reconstructions and MIPs were
obtained to evaluate the vascular anatomy.
CONTRAST:  100 mL Omnipaque 350

[Series 6: thins for pacs · axial · 0.62mm/px · z∈[+1459,+1682]mm · 19 of 249 slices shown]
[im 13/249  lung]
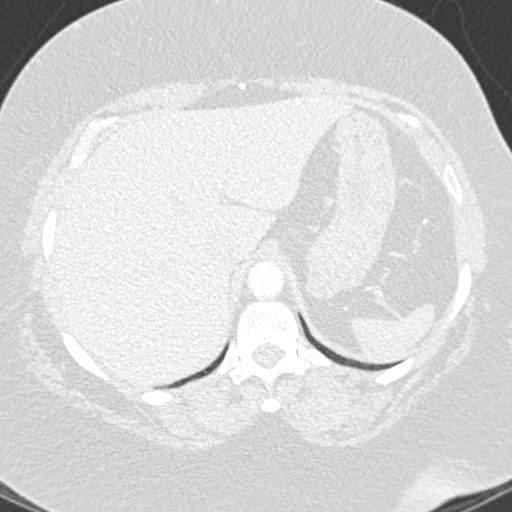
[im 25/249  mediastinal]
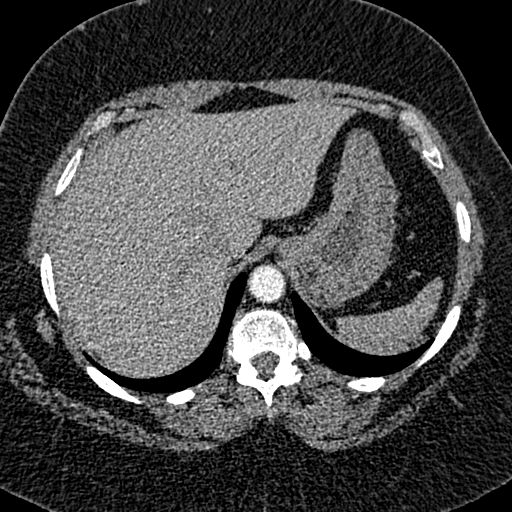
[im 38/249  lung]
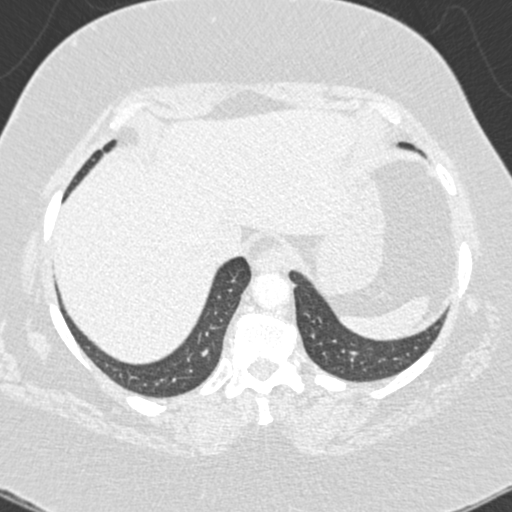
[im 63/249  mediastinal]
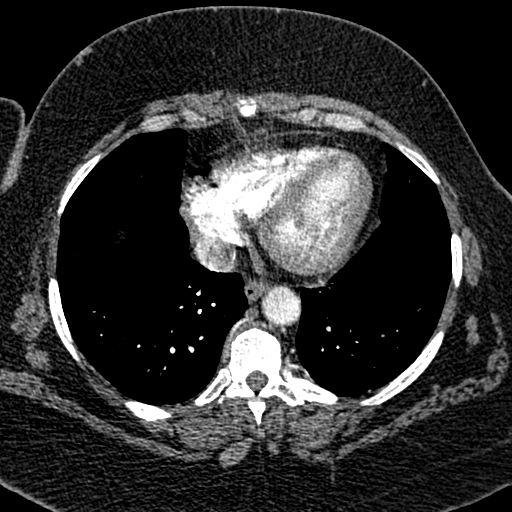
[im 75/249  lung]
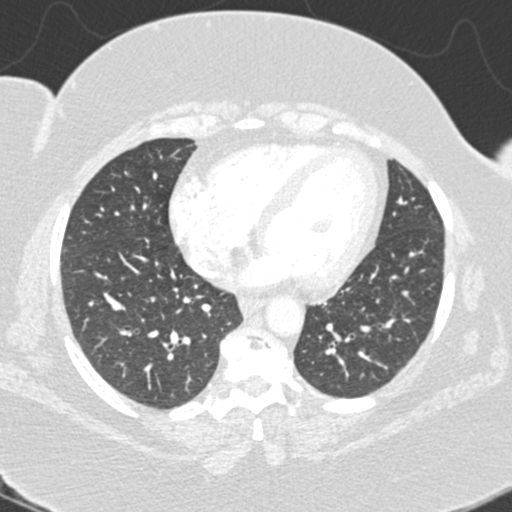
[im 83/249  mediastinal]
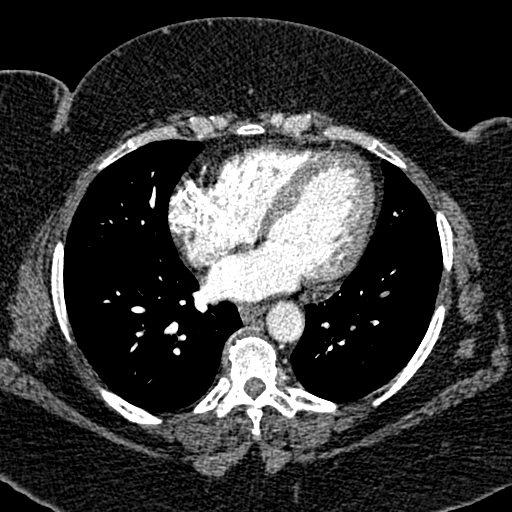
[im 87/249  lung]
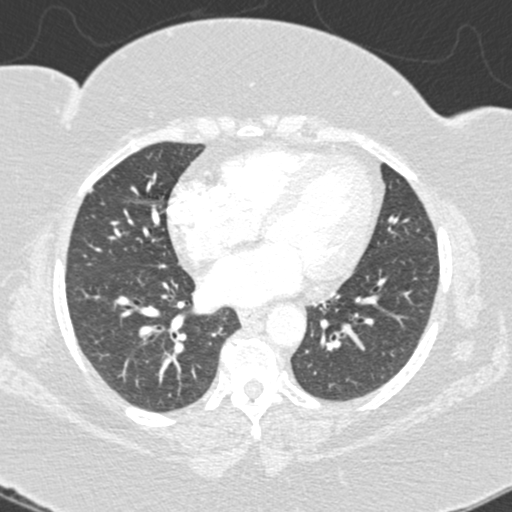
[im 100/249  mediastinal]
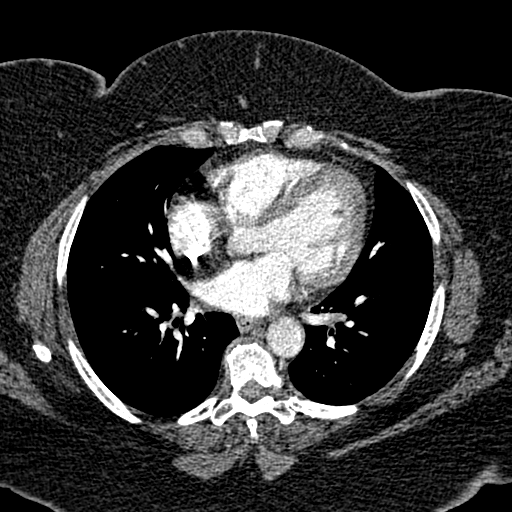
[im 112/249  lung]
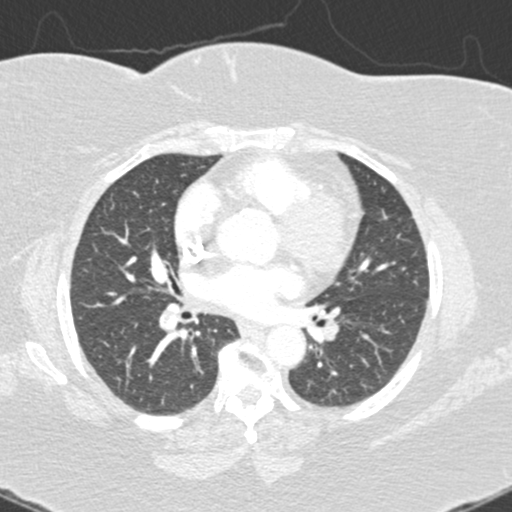
[im 125/249  mediastinal]
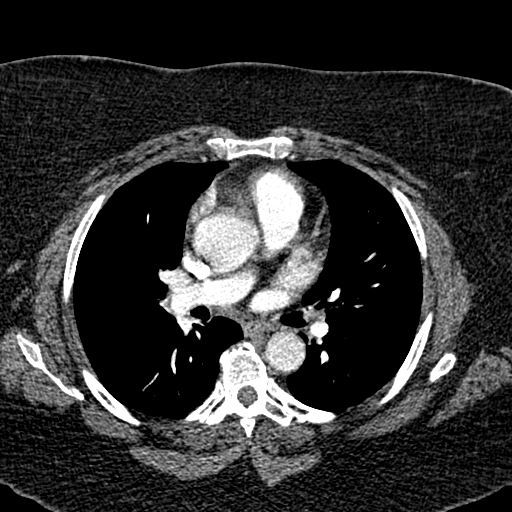
[im 137/249  lung]
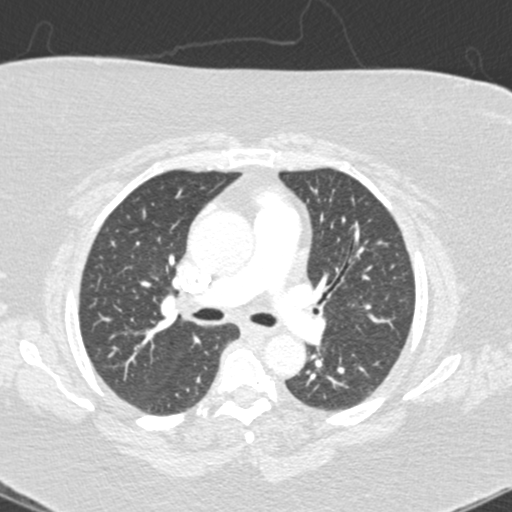
[im 149/249  mediastinal]
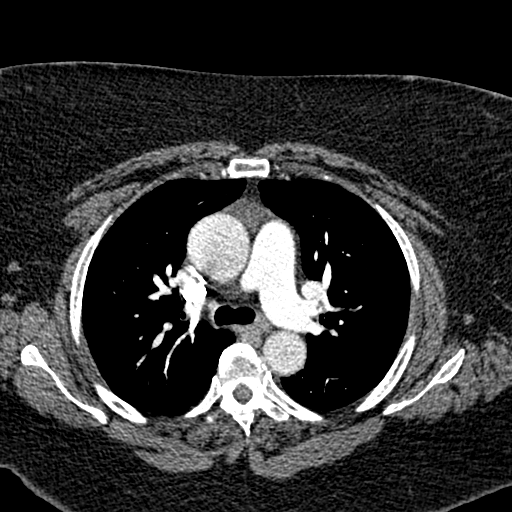
[im 162/249  lung]
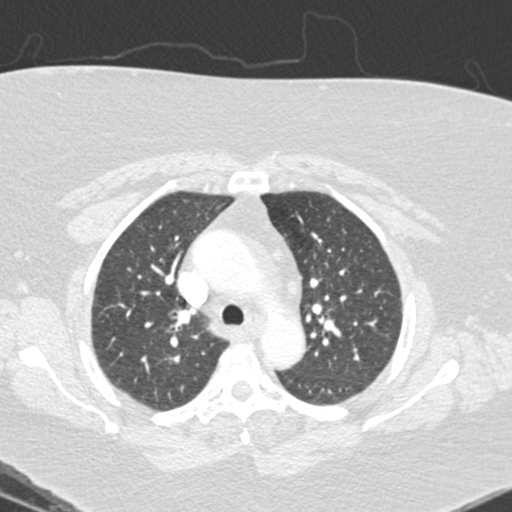
[im 166/249  mediastinal]
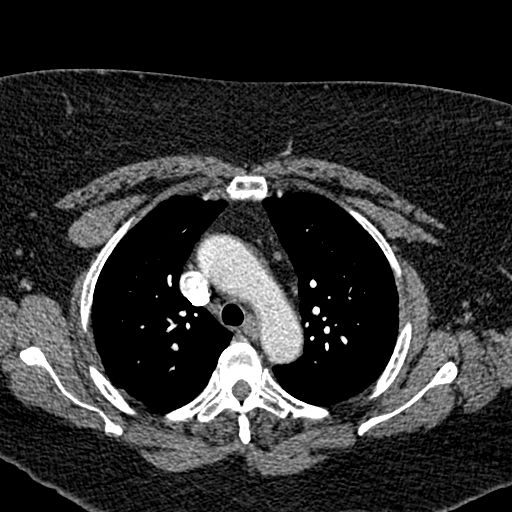
[im 174/249  lung]
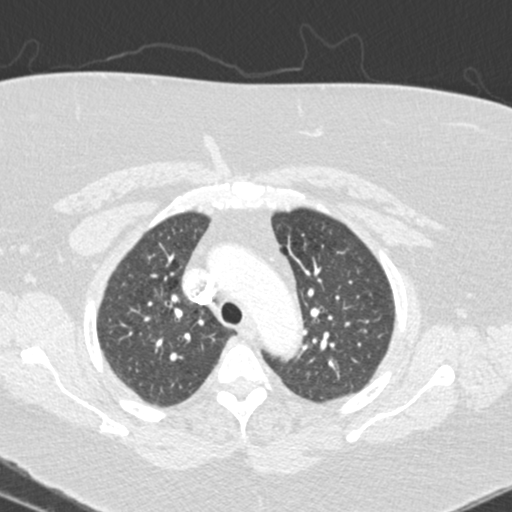
[im 187/249  mediastinal]
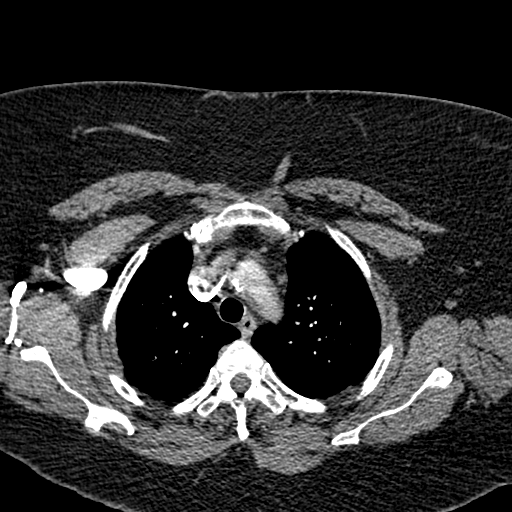
[im 211/249  lung]
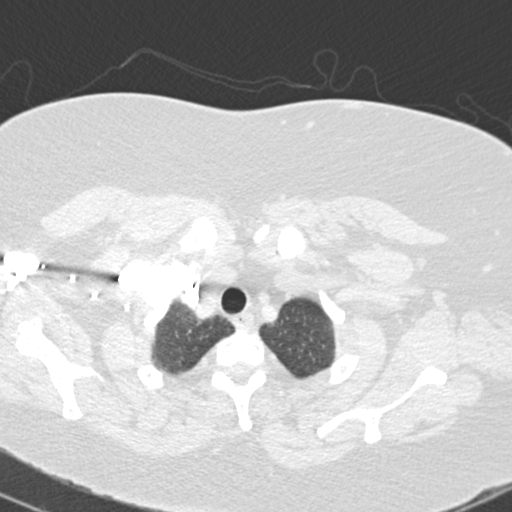
[im 224/249  mediastinal]
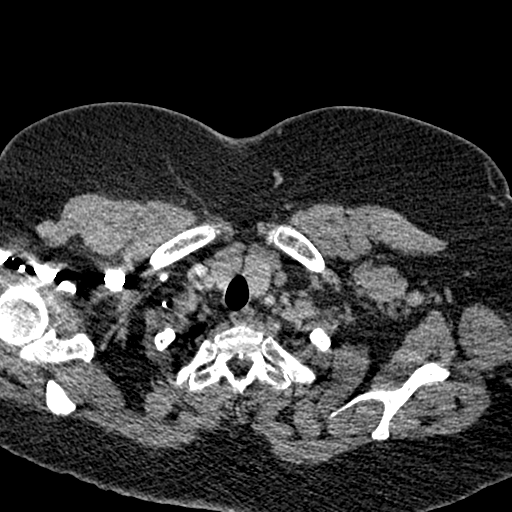
[im 236/249  lung]
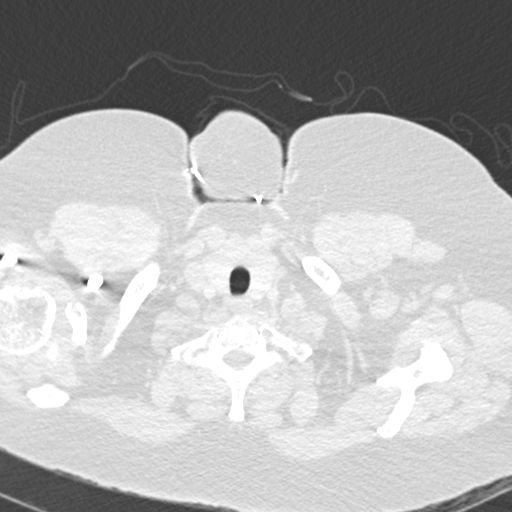

[19 of 32 positions shown; findings below may reference images not displayed]

FINDINGS: Technically adequate study with good opacification of the central
and segmental pulmonary arteries. Focal nonocclusive filling defects
are demonstrated in the distal right main pulmonary artery,
extending into lower lobe branches and middle lobe branches.
Occlusive filling defects demonstrated in left lower lobe proximal
segmental branches. Changes are consistent with acute pulmonary
embolus. RV to LV ratio is normal. No evidence of right heart
strain.

Normal heart size. Normal caliber thoracic aorta. Coronary artery
calcifications. Great vessel origins are patent. Esophagus is
decompressed. No significant lymphadenopathy in the chest.

Lungs are clear. No focal airspace disease or consolidation. No
pneumothorax. No pleural effusions. Airways appear patent.

Included portions of the upper abdominal organs are grossly
unremarkable. Degenerative changes in the spine. No destructive bone
lesions.

Review of the MIP images confirms the above findings.
IMPRESSION: Positive for bilateral lower lobe segmental pulmonary emboli.

These results were called by telephone at the time of interpretation
on 02/16/2014 at [DATE] to Dr. Dexter Yandel, who verbally acknowledged
these results.

## 2015-10-29 ENCOUNTER — Encounter: Payer: Self-pay | Admitting: Family Medicine

## 2015-10-29 ENCOUNTER — Ambulatory Visit: Payer: Medicare HMO | Attending: Family Medicine | Admitting: Family Medicine

## 2015-10-29 VITALS — BP 110/73 | HR 87 | Temp 98.0°F | Resp 16 | Ht 59.0 in | Wt 239.0 lb

## 2015-10-29 DIAGNOSIS — K219 Gastro-esophageal reflux disease without esophagitis: Secondary | ICD-10-CM | POA: Insufficient documentation

## 2015-10-29 DIAGNOSIS — F32A Depression, unspecified: Secondary | ICD-10-CM

## 2015-10-29 DIAGNOSIS — J45909 Unspecified asthma, uncomplicated: Secondary | ICD-10-CM | POA: Insufficient documentation

## 2015-10-29 DIAGNOSIS — M1990 Unspecified osteoarthritis, unspecified site: Secondary | ICD-10-CM | POA: Insufficient documentation

## 2015-10-29 DIAGNOSIS — Z79899 Other long term (current) drug therapy: Secondary | ICD-10-CM | POA: Diagnosis not present

## 2015-10-29 DIAGNOSIS — J449 Chronic obstructive pulmonary disease, unspecified: Secondary | ICD-10-CM | POA: Diagnosis not present

## 2015-10-29 DIAGNOSIS — Z86718 Personal history of other venous thrombosis and embolism: Secondary | ICD-10-CM | POA: Diagnosis not present

## 2015-10-29 DIAGNOSIS — I638 Other cerebral infarction: Secondary | ICD-10-CM

## 2015-10-29 DIAGNOSIS — Z91018 Allergy to other foods: Secondary | ICD-10-CM | POA: Insufficient documentation

## 2015-10-29 DIAGNOSIS — G4733 Obstructive sleep apnea (adult) (pediatric): Secondary | ICD-10-CM | POA: Diagnosis not present

## 2015-10-29 DIAGNOSIS — Z96653 Presence of artificial knee joint, bilateral: Secondary | ICD-10-CM | POA: Insufficient documentation

## 2015-10-29 DIAGNOSIS — R4701 Aphasia: Secondary | ICD-10-CM

## 2015-10-29 DIAGNOSIS — Z720 Tobacco use: Secondary | ICD-10-CM

## 2015-10-29 DIAGNOSIS — Z9851 Tubal ligation status: Secondary | ICD-10-CM | POA: Diagnosis not present

## 2015-10-29 DIAGNOSIS — I63512 Cerebral infarction due to unspecified occlusion or stenosis of left middle cerebral artery: Secondary | ICD-10-CM

## 2015-10-29 DIAGNOSIS — Z7901 Long term (current) use of anticoagulants: Secondary | ICD-10-CM | POA: Diagnosis not present

## 2015-10-29 DIAGNOSIS — J438 Other emphysema: Secondary | ICD-10-CM

## 2015-10-29 DIAGNOSIS — I82409 Acute embolism and thrombosis of unspecified deep veins of unspecified lower extremity: Secondary | ICD-10-CM | POA: Insufficient documentation

## 2015-10-29 DIAGNOSIS — F17200 Nicotine dependence, unspecified, uncomplicated: Secondary | ICD-10-CM | POA: Diagnosis not present

## 2015-10-29 DIAGNOSIS — T148 Other injury of unspecified body region: Secondary | ICD-10-CM

## 2015-10-29 DIAGNOSIS — F329 Major depressive disorder, single episode, unspecified: Secondary | ICD-10-CM | POA: Diagnosis not present

## 2015-10-29 DIAGNOSIS — Z85038 Personal history of other malignant neoplasm of large intestine: Secondary | ICD-10-CM | POA: Diagnosis not present

## 2015-10-29 DIAGNOSIS — G4700 Insomnia, unspecified: Secondary | ICD-10-CM

## 2015-10-29 DIAGNOSIS — I10 Essential (primary) hypertension: Secondary | ICD-10-CM | POA: Diagnosis not present

## 2015-10-29 DIAGNOSIS — T148XXA Other injury of unspecified body region, initial encounter: Secondary | ICD-10-CM

## 2015-10-29 DIAGNOSIS — E785 Hyperlipidemia, unspecified: Secondary | ICD-10-CM | POA: Insufficient documentation

## 2015-10-29 DIAGNOSIS — F419 Anxiety disorder, unspecified: Secondary | ICD-10-CM

## 2015-10-29 DIAGNOSIS — Z9889 Other specified postprocedural states: Secondary | ICD-10-CM | POA: Insufficient documentation

## 2015-10-29 DIAGNOSIS — Z6841 Body Mass Index (BMI) 40.0 and over, adult: Secondary | ICD-10-CM | POA: Diagnosis not present

## 2015-10-29 DIAGNOSIS — Z86711 Personal history of pulmonary embolism: Secondary | ICD-10-CM

## 2015-10-29 DIAGNOSIS — G629 Polyneuropathy, unspecified: Secondary | ICD-10-CM | POA: Diagnosis not present

## 2015-10-29 DIAGNOSIS — N83209 Unspecified ovarian cyst, unspecified side: Secondary | ICD-10-CM | POA: Diagnosis not present

## 2015-10-29 DIAGNOSIS — I6389 Other cerebral infarction: Secondary | ICD-10-CM

## 2015-10-29 DIAGNOSIS — Z8673 Personal history of transient ischemic attack (TIA), and cerebral infarction without residual deficits: Secondary | ICD-10-CM | POA: Insufficient documentation

## 2015-10-29 DIAGNOSIS — R569 Unspecified convulsions: Secondary | ICD-10-CM

## 2015-10-29 MED ORDER — HYDROXYZINE HCL 10 MG PO TABS: 10.0000 mg | ORAL_TABLET | Freq: Three times a day (TID) | ORAL | Status: DC | PRN

## 2015-10-29 MED ORDER — ALBUTEROL SULFATE HFA 108 (90 BASE) MCG/ACT IN AERS: 2.0000 | INHALATION_SPRAY | Freq: Four times a day (QID) | RESPIRATORY_TRACT | Status: AC | PRN

## 2015-10-29 MED ORDER — GABAPENTIN 300 MG PO CAPS: 600.0000 mg | ORAL_CAPSULE | Freq: Two times a day (BID) | ORAL | Status: DC

## 2015-10-29 MED ORDER — LISINOPRIL 5 MG PO TABS: 5.0000 mg | ORAL_TABLET | Freq: Every day | ORAL | Status: DC

## 2015-10-29 MED ORDER — RIVAROXABAN 20 MG PO TABS: ORAL_TABLET | ORAL | Status: AC

## 2015-10-29 MED ORDER — ATORVASTATIN CALCIUM 40 MG PO TABS: 40.0000 mg | ORAL_TABLET | Freq: Every day | ORAL | Status: DC

## 2015-10-29 MED ORDER — CITALOPRAM HYDROBROMIDE 20 MG PO TABS: 20.0000 mg | ORAL_TABLET | Freq: Every day | ORAL | Status: DC

## 2015-10-29 MED ORDER — LEVETIRACETAM 500 MG PO TABS: 500.0000 mg | ORAL_TABLET | Freq: Two times a day (BID) | ORAL | Status: DC

## 2015-10-29 NOTE — Progress Notes (Signed)
Patient c/o having anxiety. Patient states that her medication for anxiety is not working .  Patient states that the meds for sleep is not working.  Patient states she has a bruise on R outer thigh and L forearm. Patient says she's not sure where it came from.

## 2015-10-29 NOTE — Patient Instructions (Signed)
1.Increase gabapentin to 600 mg twice daily. 2.Commence hydroxyzine 3.Commence lisinopril 4.Discontinue hydrochlorothiazide 5.Discontinue potassium chloride   You should receive a call regarding your sleep study and neurology referrals.

## 2015-10-29 NOTE — Progress Notes (Signed)
Subjective:    Patient ID: Brooke [name], female    DOB: June 29, 1955, 60 y.o.   MRN: [id number]  HPI Brooke [name] is a 60 y.o. 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  She never followed up with neurology post discharge due to inability to pay the $45 co-pay which was requested by that practice but has been compliant with all her medications including Keppra.  Today she informs me that she has persistent numbness in the fingers of both hands which is not controlled on gabapentin. She also complains of her "anxiety" worsening and would like to have her lorazepam which she received over a year ago from another clinician refilled; she is on Celexa for depression She has insomnia and multiple nighttime awakenings. She is concerned about to bruises which she sustained on her left arm and right thigh and denies a history of trauma; sites are not painful and bruises noted increasing in size. Denies hematochezia, hematemesis, hematuria of bleeding from her gums.  She continues to smoke 1-2 cigarettes per day intermittently and is not ready to quit.  Past Medical History  Diagnosis Date  . Hypertension   . Asthma   . COPD (chronic obstructive pulmonary disease) ([place])   . Anxiety   . Colon cancer ([place])     a. s/p surgery, chemotherapy.  . Dyslipidemia   . Migraine headache   . Chronic bronchitis   . Uterine fibroid   . Ovarian cyst   . GERD (gastroesophageal reflux disease)   . Morbid obesity ([place])   . Obstructive sleep apnea     mild  . Chronic pain   . Chronic back pain   . Arthritis   . Osteoarthritis   . Depression   . DVT (deep venous thrombosis) ([place]) 02/2014  . 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  PCCM recommended lifelong anticoagulation.  . Tobacco abuse   . PVC's (premature ventricular contractions) 2013  . Aortic stenosis     a. mild by echo 05/2015.  . Stroke [facility])     Past Surgical History  Procedure Laterality Date  . Knee arthroscopy      bil.  . Carpal tunnel release      rt  . Mouth surgery      teeth extraction  . Cesarean section    . Subtotal colectomy    . Colonoscopy    . Tubal ligation      Allergies  Allergen Reactions  . Kiwi Extract Anaphylaxis, Hives and Swelling    Makes her swell all over   . Aspirin Nausea Only    Can take as long as enteric coated    Current Outpatient Prescriptions on File Prior to Visit  Medication Sig Dispense Refill  . acetaminophen (TYLENOL) 500 MG tablet Take 500 mg by mouth every 6 (six) hours as needed for headache.    Marland Kitchen omeprazole (PRILOSEC) 40 MG capsule TAKE 1 CAPSULE EVERY DAY 90 capsule 0  . [DISCONTINUED] fluticasone (FLOVENT HFA) 110 MCG/ACT inhaler Inhale 2 puffs into the lungs every 12 (twelve) hours. (Patient not taking: Reported on 09/23/2014) 1 Inhaler 12  . [DISCONTINUED] ipratropium-albuterol (DUONEB) 0.5-2.5 (3) MG/3ML SOLN Take 3 mLs by nebulization every 4 (four) hours. 360 mL 0  . [DISCONTINUED] traZODone (DESYREL) 50  MG tablet Take 1 tablet (50 mg total) by mouth at bedtime. (Patient not taking: Reported on 02/15/2015) 30 tablet 2   No current facility-administered medications on file prior to visit.      Review of Systems Constitutional: Negative for fever, chills, diaphoresis, activity change, appetite change and fatigue. HENT: Negative for ear pain, nosebleeds, congestion, facial swelling, rhinorrhea, neck pain, neck stiffness and ear discharge.  Eyes: Negative for pain, discharge, redness, itching and visual disturbance. Respiratory: Negative for cough, choking, chest tightness, shortness of breath, wheezing and stridor.  Cardiovascular: Negative for chest pain, palpitations and leg  swelling. Gastrointestinal: Negative for abdominal distention. Genitourinary: Negative for dysuria, urgency, frequency, hematuria, flank pain, decreased urine volume, difficulty urinating and dyspareunia.  Musculoskeletal: Negative for back pain, joint swelling, arthralgias and positive for gait problem. Neurological: See history of present illness.  Hematological: Negative for adenopathy. Bruises on hand and thigh Psychiatric/Behavioral: Positive for anxiety and insomnia.    Objective: Filed Vitals:   10/29/15 1601  Height: 4\' 11"  (1.499 m)  Weight: 239 lb (108.41 kg)      Physical Exam Constitutional: Patient is obese appears well-developed and well-nourished. No distress. HENT: Normocephalic, atraumatic, External right and left ear normal. Oropharynx is clear and moist.  Eyes: Conjunctivae and EOM are normal. PERRLA, no scleral icterus. Neck: Normal ROM. Neck supple. No JVD. No tracheal deviation. No thyromegaly. CVS: RRR, S1/S2 +, no murmurs, no gallops, no carotid bruit.  Pulmonary: Effort and breath sounds normal, no stridor, rhonchi, wheezes, rales.  Abdominal: Soft. BS +,  no distension, tenderness, rebound or guarding.  Musculoskeletal: Reduced abduction in L shoulder Lymphadenopathy: No lymphadenopathy noted, cervical, inguinal or axillary Neuro: Expressive aphasia,; motor strength in upper extremities : L 3+/5, R 3/5, Left lower extremity 5/5, right lower extremity 4/5 Skin: Bruise on left antecubital fossa and right outer thigh Psychiatric: Normal mood and affect. Behavior, judgment, thought content normal.        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 Completed PT Never followed up wit Neuro post discharge and has been referred Advised on fall precautions - she is high risk for falls.  Seizures: No recent seizures. Continue Keppra Advised to keep appointment with  neurology.  Anxiety and Depression: Commenced on hydroxyzine hopefully the sedating side effects will help with insomnia. I have discontinued lorazepam and explained to her that this is habit forming and not for long-term treatment. She is already on Celexa  Obstructive sleep apnea and insomnia: Symptoms of obstructive sleep apnea could worsen insomnia and so I have referred her for a sleep study. Hopefully the side effects of hydroxyzine on gabapentin should help with sedation.  Neuropathy: Increased gabapentin to 600 mg twice daily.   Right lower extremity DVT and bilateral PE in 2015: She is on chronic lifelong anticoagulation with Xarelto  Hypertension: Discontinued hydrochlorothiazide and also discontinued potassium chloride Substituted with lisinopril Labs at next visit to evaluate potassium level  COPD Patient continues to smoke; advised against smoking cessation and patient is not ready to quit because she states she smokes intermittently. Continue albuterol MDI  Obesity Discussed reducing portion sizes and increasing physical activity  Complete physical exam at next office visit.

## 2015-11-04 IMAGING — CR DG CHEST 2V
2 series · 2 of 2 positions shown · non-contrast
Comparison: CTA chest dated 02/16/2014

CLINICAL DATA: Shortness of breath, history of PE/DVT

EXAM:
CHEST  2 VIEW

[w chest pa]
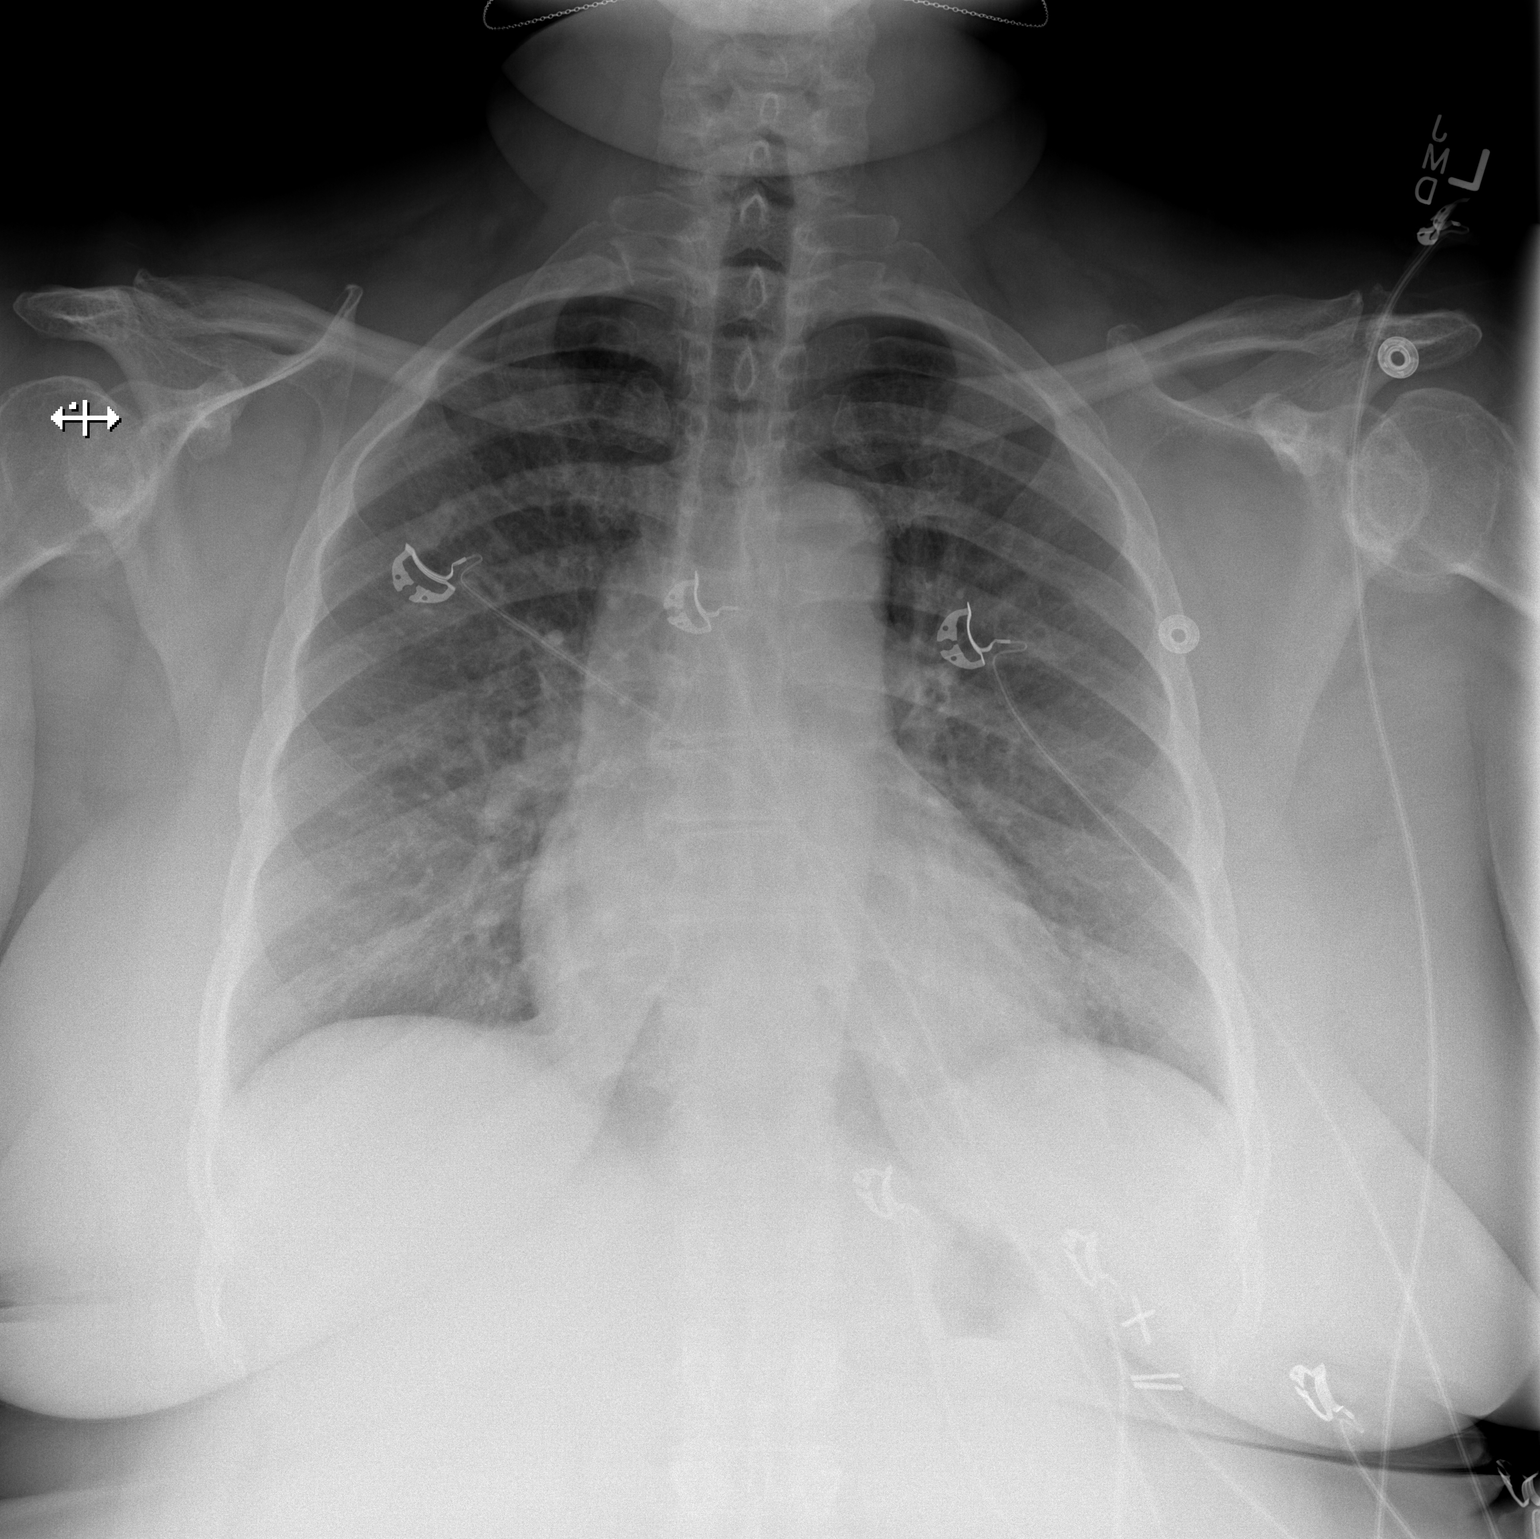

[w chest lat]
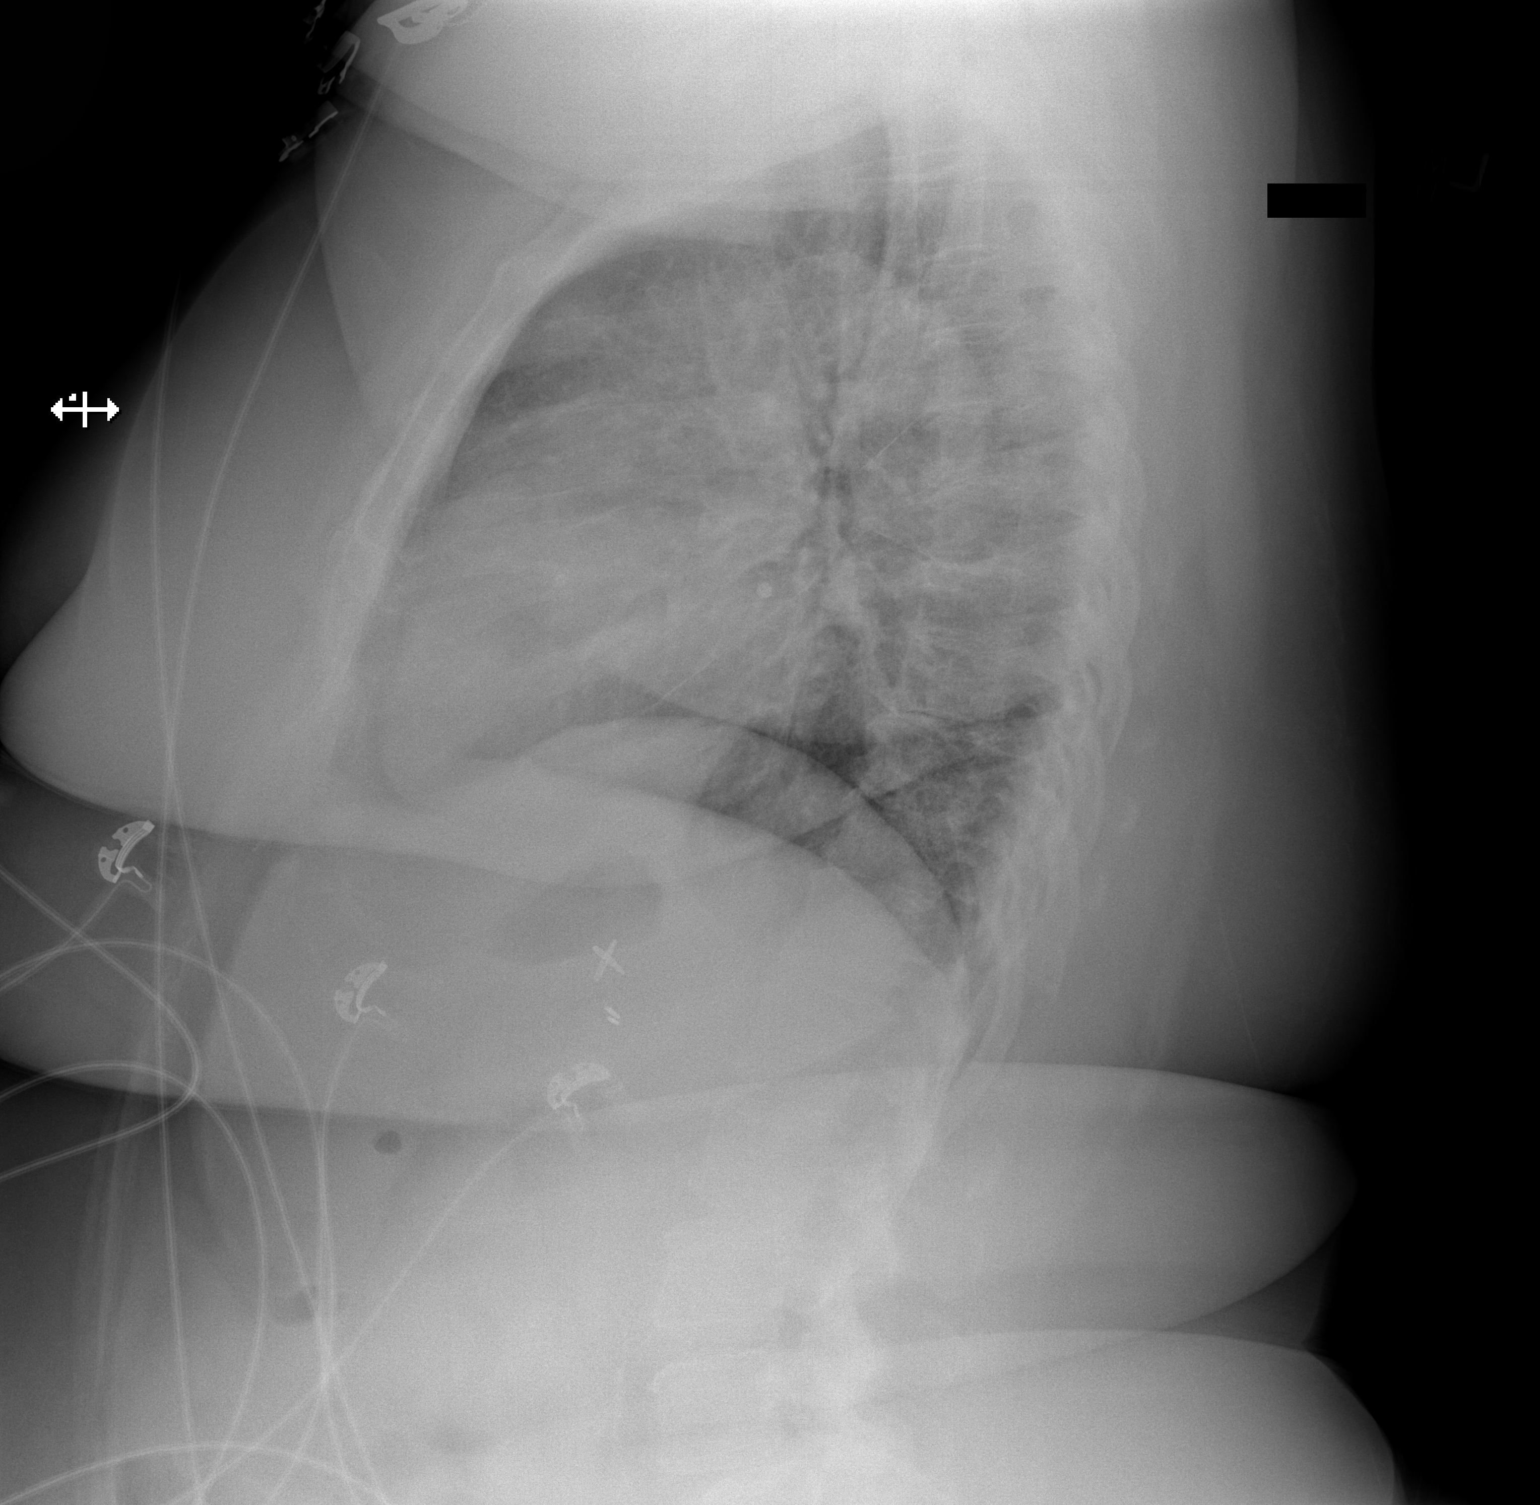

[2 of 2 positions shown; findings below may reference images not displayed]

FINDINGS: Interstitial markings, chronic. No focal consolidation. No pleural
effusion or pneumothorax.

The heart is top-normal in size.

Degenerative changes of the visualized thoracolumbar spine.
IMPRESSION: No evidence of acute cardiopulmonary disease.

## 2015-11-05 IMAGING — CT CT ANGIO CHEST
2 of 6 series · 19 of 36 positions shown · IV contrast (OMNIPAQUE)
Comparison: 02/16/2014

CLINICAL DATA: Worsening shortness of Breath, history of pulmonary
embolism

EXAM:
CT ANGIOGRAPHY CHEST WITH CONTRAST
TECHNIQUE: Multidetector CT imaging of the chest was performed using the
standard protocol during bolus administration of intravenous
contrast. Multiplanar CT image reconstructions and MIPs were
obtained to evaluate the vascular anatomy.
CONTRAST:  100mL OMNIPAQUE IOHEXOL 350 MG/ML SOLN

[Series 6: pe thins @ 1mm · axial · 0.68mm/px · z∈[-284,-50]mm · 18 of 262 slices shown]
[im 14/262  lung]
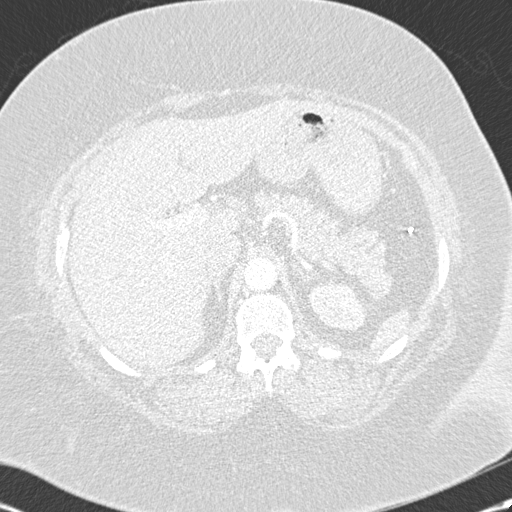
[im 27/262  mediastinal]
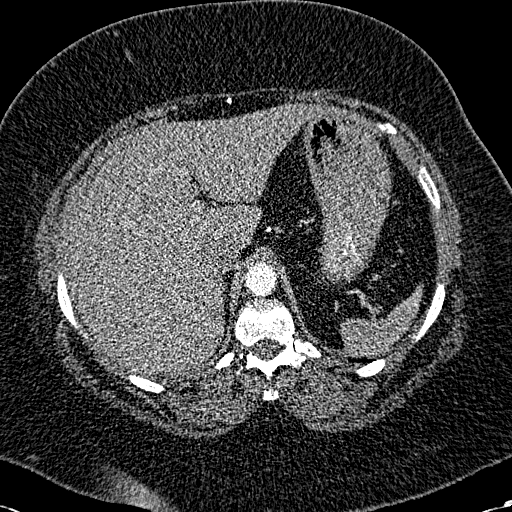
[im 40/262  lung]
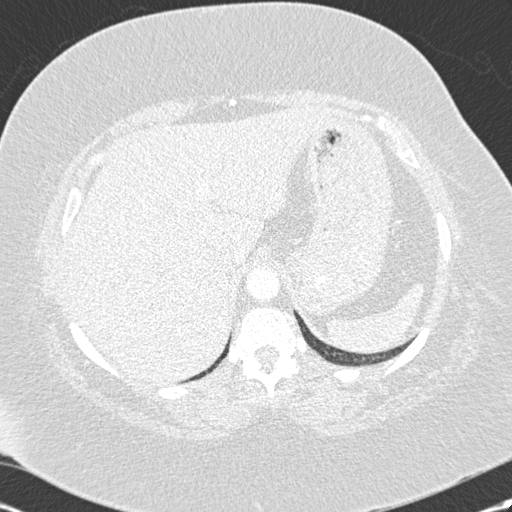
[im 53/262  mediastinal]
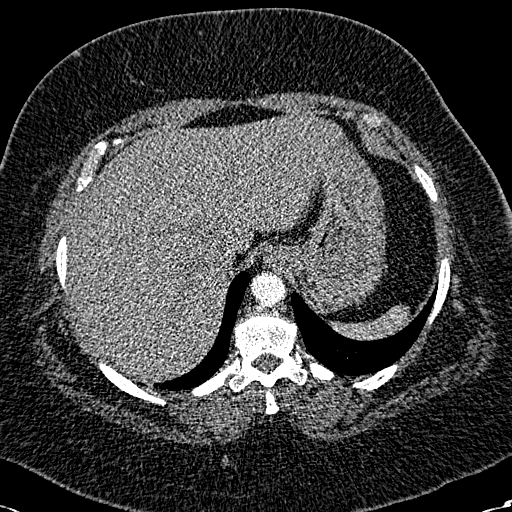
[im 66/262  lung]
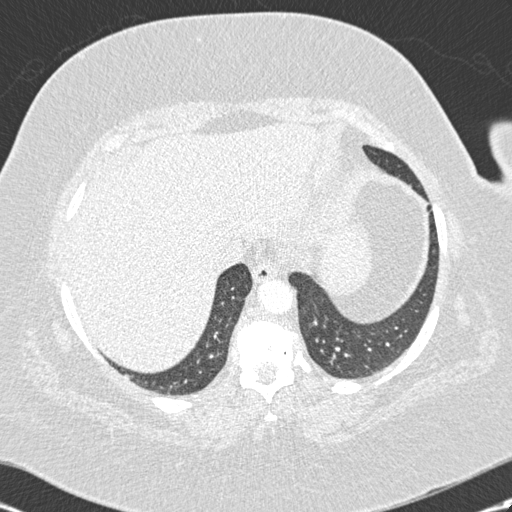
[im 79/262  mediastinal]
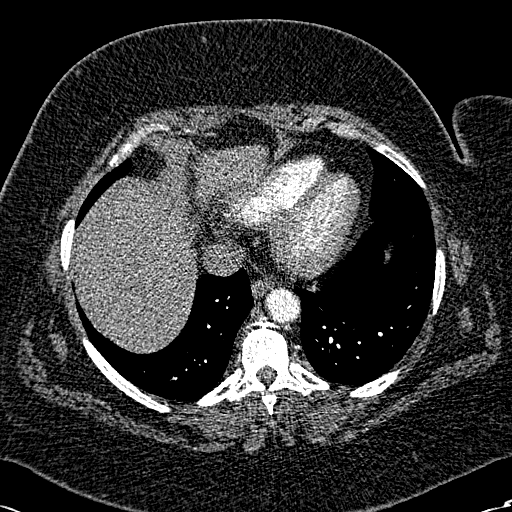
[im 92/262  lung]
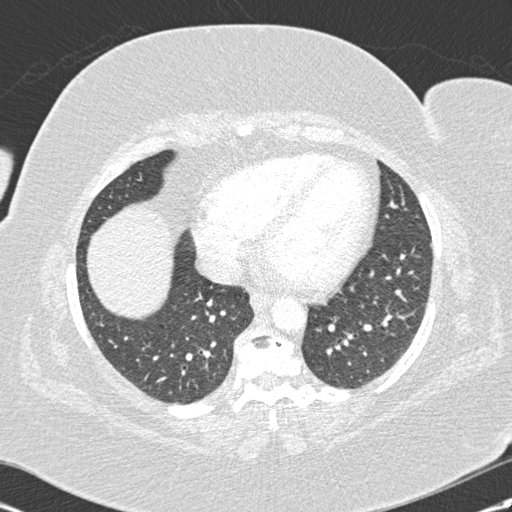
[im 105/262  mediastinal]
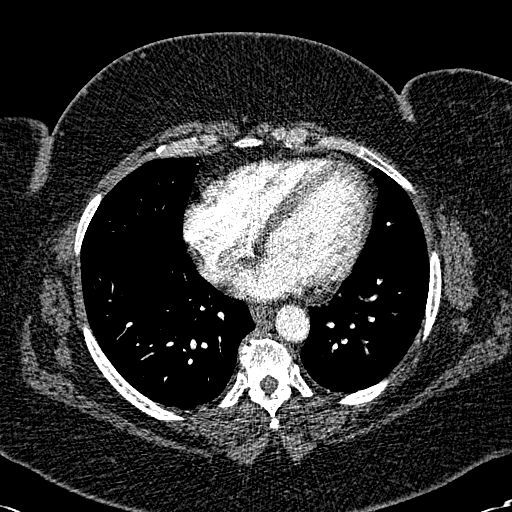
[im 118/262  lung]
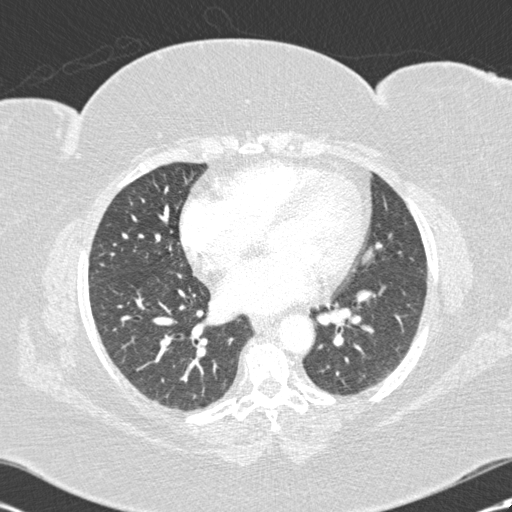
[im 144/262  mediastinal]
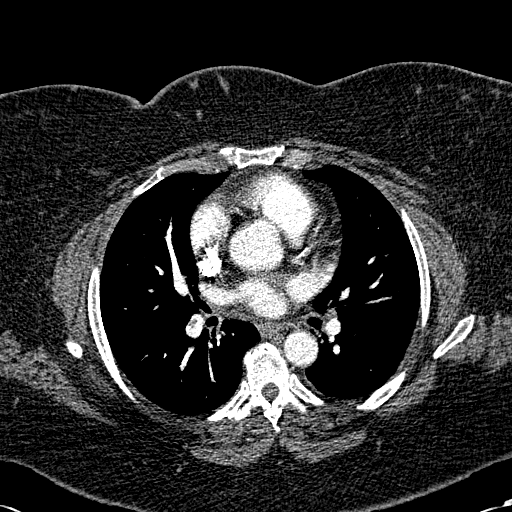
[im 157/262  lung]
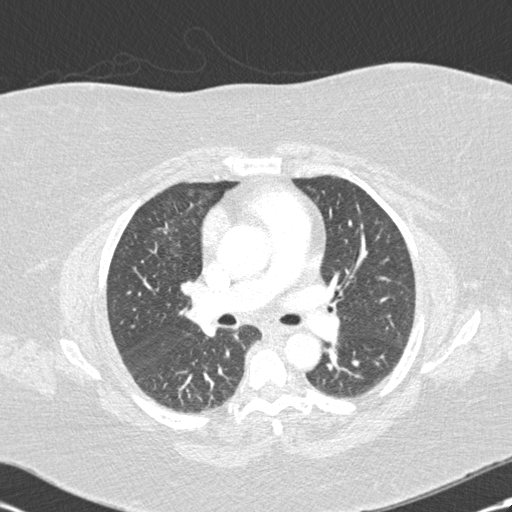
[im 170/262  mediastinal]
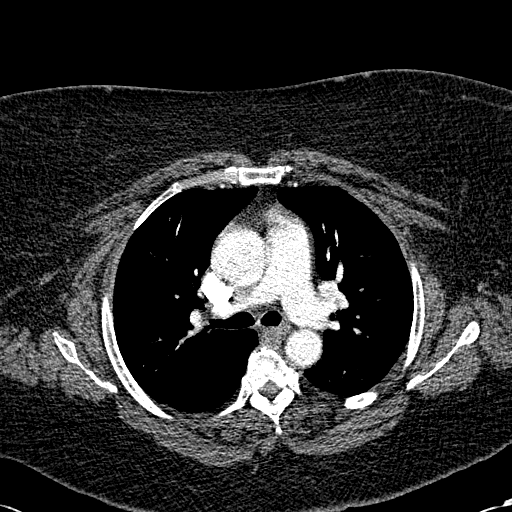
[im 183/262  lung]
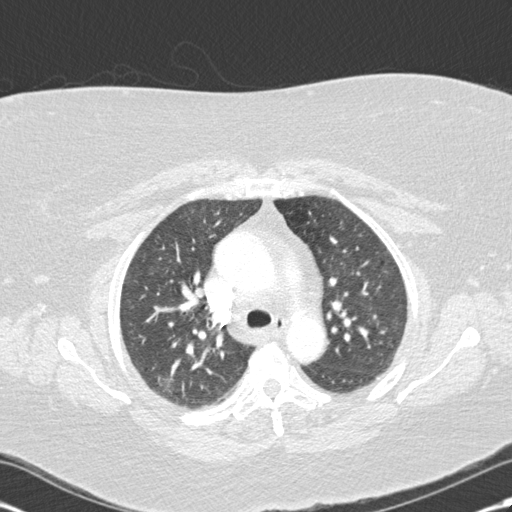
[im 196/262  mediastinal]
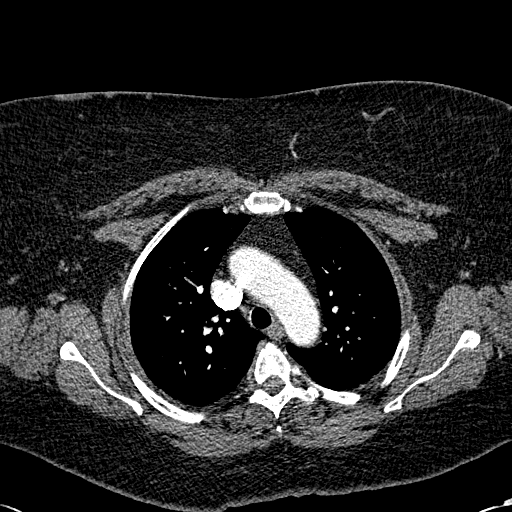
[im 209/262  lung]
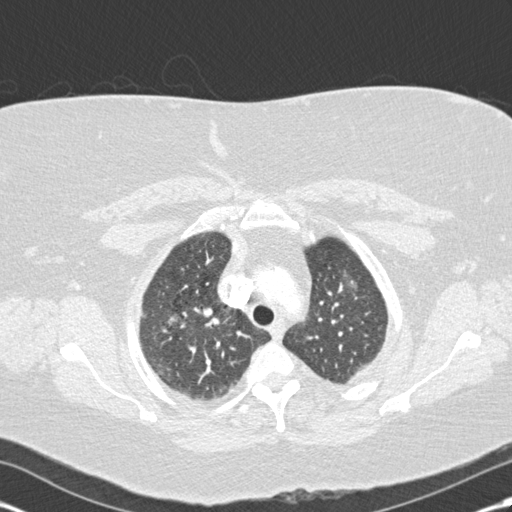
[im 222/262  mediastinal]
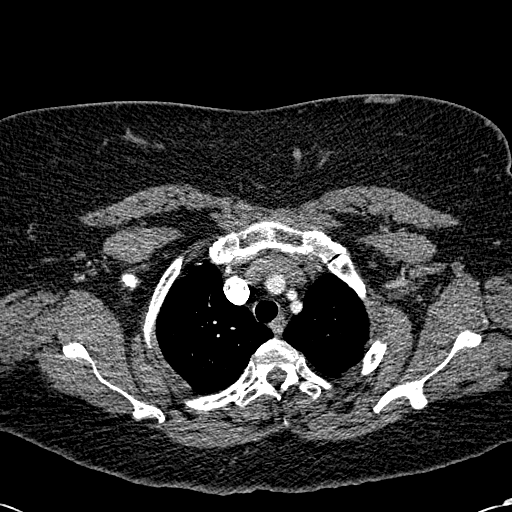
[im 235/262  lung]
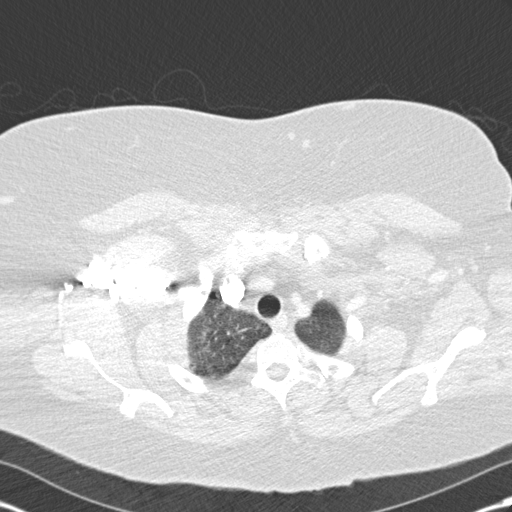
[im 248/262  mediastinal]
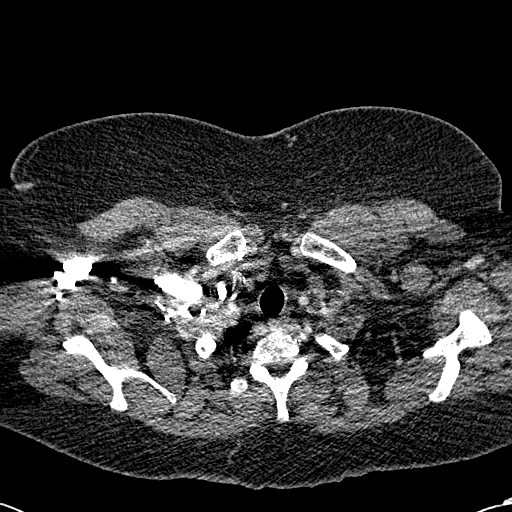

[Series 602: coronals · coronal · 0.68mm/px · 1 of 111 slices shown]
[im 56/111  mediastinal]
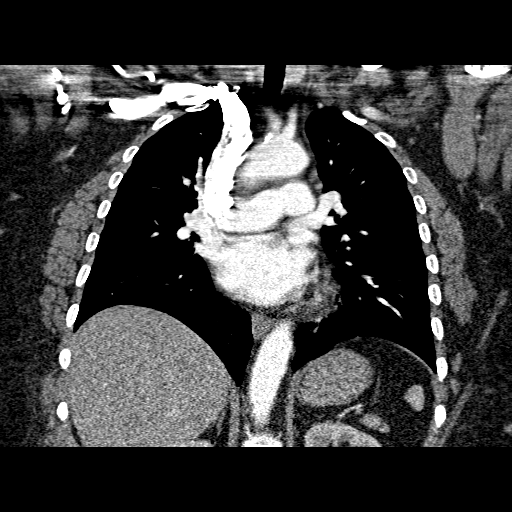

[19 of 36 positions shown; findings below may reference images not displayed]

FINDINGS: The lungs are again well aerated without focal infiltrate or sizable
effusion. No parenchymal nodule or focal mass lesion is seen. No
significant hilar or mediastinal adenopathy is noted.

There are bilateral pulmonary emboli identified similar to that seen
on the prior exam no new significant pulmonary emboli are seen. The
RV to LV ratio is within normal limits. No right heart strain is
seen. Coronary calcifications are noted.

Visualized portions of the upper abdomen reveal no acute
abnormality. No acute bony abnormality is seen

Review of the MIP images confirms the above findings.
IMPRESSION: Bilateral pulmonary emboli, relatively stable from the prior exam.
No new significant emboli are seen. No right heart strain is noted.

## 2015-11-08 ENCOUNTER — Ambulatory Visit: Payer: Medicare HMO | Admitting: Neurology

## 2015-11-09 ENCOUNTER — Encounter: Payer: Self-pay | Admitting: Neurology

## 2015-11-22 ENCOUNTER — Emergency Department (HOSPITAL_COMMUNITY): Payer: Medicare HMO

## 2015-11-22 ENCOUNTER — Encounter (HOSPITAL_COMMUNITY): Payer: Self-pay | Admitting: Emergency Medicine

## 2015-11-22 ENCOUNTER — Other Ambulatory Visit: Payer: Self-pay

## 2015-11-22 ENCOUNTER — Emergency Department (HOSPITAL_COMMUNITY)
Admission: EM | Admit: 2015-11-22 | Discharge: 2015-11-23 | Disposition: A | Discharge status: Home / Self Care | Payer: Medicare HMO | Attending: Emergency Medicine | Admitting: Emergency Medicine

## 2015-11-22 DIAGNOSIS — F329 Major depressive disorder, single episode, unspecified: Secondary | ICD-10-CM | POA: Insufficient documentation

## 2015-11-22 DIAGNOSIS — I10 Essential (primary) hypertension: Secondary | ICD-10-CM | POA: Diagnosis not present

## 2015-11-22 DIAGNOSIS — J0100 Acute maxillary sinusitis, unspecified: Secondary | ICD-10-CM

## 2015-11-22 DIAGNOSIS — J449 Chronic obstructive pulmonary disease, unspecified: Secondary | ICD-10-CM | POA: Insufficient documentation

## 2015-11-22 DIAGNOSIS — J40 Bronchitis, not specified as acute or chronic: Secondary | ICD-10-CM

## 2015-11-22 DIAGNOSIS — F17210 Nicotine dependence, cigarettes, uncomplicated: Secondary | ICD-10-CM | POA: Diagnosis not present

## 2015-11-22 DIAGNOSIS — R29810 Facial weakness: Secondary | ICD-10-CM | POA: Diagnosis not present

## 2015-11-22 DIAGNOSIS — Z8673 Personal history of transient ischemic attack (TIA), and cerebral infarction without residual deficits: Secondary | ICD-10-CM | POA: Diagnosis not present

## 2015-11-22 DIAGNOSIS — E785 Hyperlipidemia, unspecified: Secondary | ICD-10-CM | POA: Insufficient documentation

## 2015-11-22 DIAGNOSIS — Z79899 Other long term (current) drug therapy: Secondary | ICD-10-CM | POA: Insufficient documentation

## 2015-11-22 DIAGNOSIS — M1990 Unspecified osteoarthritis, unspecified site: Secondary | ICD-10-CM | POA: Diagnosis not present

## 2015-11-22 DIAGNOSIS — Z85038 Personal history of other malignant neoplasm of large intestine: Secondary | ICD-10-CM | POA: Diagnosis not present

## 2015-11-22 DIAGNOSIS — Z720 Tobacco use: Secondary | ICD-10-CM

## 2015-11-22 DIAGNOSIS — R05 Cough: Secondary | ICD-10-CM | POA: Diagnosis present

## 2015-11-22 LAB — COMPREHENSIVE METABOLIC PANEL
ALBUMIN: 4.1 g/dL (ref 3.5–5.0)
ALT: 10 U/L — AB (ref 14–54)
AST: 15 U/L (ref 15–41)
Alkaline Phosphatase: 71 U/L (ref 38–126)
Anion gap: 10 (ref 5–15)
BUN: 7 mg/dL (ref 6–20)
CHLORIDE: 104 mmol/L (ref 101–111)
CO2: 24 mmol/L (ref 22–32)
CREATININE: 0.7 mg/dL (ref 0.44–1.00)
Calcium: 8.9 mg/dL (ref 8.9–10.3)
GFR calc non Af Amer: >60 mL/min (ref >60)
GLUCOSE: 102 mg/dL — AB (ref 65–99)
Potassium: 3.2 mmol/L — ABNORMAL LOW (ref 3.5–5.1)
SODIUM: 138 mmol/L (ref 135–145)
Total Bilirubin: 0.8 mg/dL (ref 0.3–1.2)
Total Protein: 7.8 g/dL (ref 6.5–8.1)

## 2015-11-22 LAB — CBC WITH DIFFERENTIAL/PLATELET
Basophils Absolute: 0 10*3/uL (ref 0.0–0.1)
Basophils Relative: 0 %
Eosinophils Absolute: 0.1 10*3/uL (ref 0.0–0.7)
Eosinophils Relative: 1 %
HEMATOCRIT: 44.5 % (ref 36.0–46.0)
Hemoglobin: 15.1 g/dL — ABNORMAL HIGH (ref 12.0–15.0)
LYMPHS ABS: 3 10*3/uL (ref 0.7–4.0)
Lymphocytes Relative: 40 %
MCH: 32.7 pg (ref 26.0–34.0)
MCHC: 33.9 g/dL (ref 30.0–36.0)
MCV: 96.3 fL (ref 78.0–100.0)
MONO ABS: 0.7 10*3/uL (ref 0.1–1.0)
MONOS PCT: 9 %
NEUTROS ABS: 3.7 10*3/uL (ref 1.7–7.7)
NEUTROS PCT: 50 %
Platelets: 181 10*3/uL (ref 150–400)
RBC: 4.62 MIL/uL (ref 3.87–5.11)
RDW: 13.8 % (ref 11.5–15.5)
WBC: 7.5 10*3/uL (ref 4.0–10.5)

## 2015-11-22 LAB — CBG MONITORING, ED: Glucose-Capillary: 101 mg/dL — ABNORMAL HIGH (ref 65–99)

## 2015-11-22 LAB — TROPONIN I: Troponin I: <0.03 ng/mL (ref <0.031)

## 2015-11-22 MED ORDER — MORPHINE SULFATE (PF) 4 MG/ML IV SOLN
4.0000 mg | Freq: Once | INTRAVENOUS | Status: AC
  Administered 2015-11-22: 4 mg via INTRAVENOUS
  Filled 2015-11-22: qty 1

## 2015-11-22 MED ORDER — ONDANSETRON HCL 4 MG/2ML IJ SOLN
4.0000 mg | Freq: Once | INTRAMUSCULAR | Status: AC
  Administered 2015-11-22: 4 mg via INTRAVENOUS
  Filled 2015-11-22: qty 2

## 2015-11-22 MED ORDER — IPRATROPIUM-ALBUTEROL 0.5-2.5 (3) MG/3ML IN SOLN
3.0000 mL | Freq: Once | RESPIRATORY_TRACT | Status: AC
  Administered 2015-11-22: 3 mL via RESPIRATORY_TRACT
  Filled 2015-11-22: qty 3

## 2015-11-22 NOTE — ED Notes (Signed)
Per pt, she had right sided facial droop and right sided weakness earlier today around 2 pm.  She stated that it felt like it did when she had a stroke back in December.  Pt also has cough and nasal congestion for past two days

## 2015-11-22 NOTE — ED Provider Notes (Signed)
CSN: [id number]     Arrival date & time 11/22/15  2128 History   First MD Initiated Contact with Patient 11/22/15 2147     Chief Complaint  Patient presents with  . Cough  . Nasal Congestion  . Facial Pain  . Extremity Weakness  PT IS A 60 YO BF WHO IS HERE BECAUSE SHE FEELS WEAK ALL OVER AND HAS SINUS CONGESTION AND COUGH.  SHE TOLD THE NURSE THAT SHE HAS RIGHT SIDED FACIAL DROOP AND WEAKNESS, BUT HER DAUGHTER SAID THAT IS RESIDUAL FROM A STROKE SHE HAD IN December.  PT ALSO C/O PAIN TO HER BACK.   (Consider location/radiation/quality/duration/timing/severity/associated sxs/prior Treatment) Patient is a 60 y.o. female presenting with cough and extremity weakness. The history is provided by the patient and a relative.  Cough Cough characteristics:  Non-productive Severity:  Moderate Onset quality:  Gradual Progression:  Worsening Chronicity:  New Associated symptoms: shortness of breath   Extremity Weakness Associated symptoms include shortness of breath.    Past Medical History  Diagnosis Date  . Hypertension   . Asthma   . COPD (chronic obstructive pulmonary disease) ([place])   . Anxiety   . Colon cancer ([place])     a. s/p surgery, chemotherapy.  . Dyslipidemia   . Migraine headache   . Chronic bronchitis   . Uterine fibroid   . Ovarian cyst   . GERD (gastroesophageal reflux disease)   . Morbid obesity ([place])   . Obstructive sleep apnea     mild  . Chronic pain   . Chronic back pain   . Arthritis   . Osteoarthritis   . Depression   . DVT (deep venous thrombosis) ([place]) 02/2014  . 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 PCCM recommended lifelong anticoagulation.  . Tobacco abuse   . PVC's (premature ventricular contractions) 2013  . Aortic stenosis     a. mild by echo 05/2015.  . Stroke [facility])    Past Surgical History  Procedure Laterality Date  . Knee arthroscopy      bil.  . Carpal tunnel release      rt  . Mouth surgery      teeth extraction  .  Cesarean section    . Subtotal colectomy    . Colonoscopy    . Tubal ligation     Family History  Problem Relation Age of Onset  . Uterine cancer Mother   . Stroke Father   . Colon cancer Maternal Uncle   . Diabetes Father   . Ovarian cancer Mother   . Hypertension Father   . Breast cancer Maternal Grandmother   . Diabetes Sister   . Asthma Child   . Asthma Child    Social History  Substance Use Topics  . Smoking status: Current Some Day Smoker -- 1.00 packs/day for 37 years    Types: Cigarettes  . Smokeless tobacco: Never Used     Comment: trying to quit, down to .5ppd X2 years ago.   . Alcohol Use: No   OB History    Gravida Para Term Preterm AB TAB SAB Ectopic Multiple Living   2 2 2       2      Review of Systems  HENT: Positive for congestion.   Respiratory: Positive for cough and shortness of breath.   Musculoskeletal: Positive for extremity weakness.  All other systems reviewed and are negative.     Allergies  Kiwi extract and Aspirin  Home Medications  Prior to Admission medications   Medication Sig Start Date End Date Taking? Authorizing Provider  acetaminophen (TYLENOL) 500 MG tablet Take 500 mg by mouth every 6 (six) hours as needed for headache.   Yes Historical Provider, MD  albuterol (PROVENTIL HFA;VENTOLIN HFA) 108 (90 Base) MCG/ACT inhaler Inhale 2 puffs into the lungs every 6 (six) hours as needed for wheezing or shortness of breath (shortness of breath). 10/29/15  Yes Arnoldo Morale, MD  atorvastatin (LIPITOR) 40 MG tablet Take 1 tablet (40 mg total) by mouth daily at 6 PM. 10/29/15  Yes Arnoldo Morale, MD  citalopram (CELEXA) 20 MG tablet Take 1 tablet (20 mg total) by mouth daily. 10/29/15  Yes Arnoldo Morale, MD  gabapentin (NEURONTIN) 300 MG capsule Take 2 capsules (600 mg total) by mouth 2 (two) times daily. 10/29/15  Yes Arnoldo Morale, MD  levETIRAcetam (KEPPRA) 500 MG tablet Take 1 tablet (500 mg total) by mouth 2 (two) times daily. 10/29/15  Yes  Arnoldo Morale, MD  lisinopril (PRINIVIL,ZESTRIL) 5 MG tablet Take 1 tablet (5 mg total) by mouth daily. 10/29/15  Yes Arnoldo Morale, MD  LORazepam (ATIVAN) 1 MG tablet Take 1 tablet by mouth daily as needed for anxiety.  11/07/15  Yes Historical Provider, MD  omeprazole (PRILOSEC) 40 MG capsule TAKE 1 CAPSULE EVERY DAY 09/17/15  Yes Arnoldo Morale, MD  oxyCODONE-acetaminophen (PERCOCET/ROXICET) 5-325 MG tablet Take 1 tablet by mouth every 6 (six) hours as needed for moderate pain.  11/07/15  Yes Historical Provider, MD  potassium chloride SA (K-DUR,KLOR-CON) 20 MEQ tablet Take 20 mEq by mouth daily. 09/17/15  Yes Historical Provider, MD  rivaroxaban (XARELTO) 20 MG TABS tablet TAKE ONE TABLET BY MOUTH ONCE DAILY IN THE EVENING WITH SUPPER 10/29/15  Yes Arnoldo Morale, MD  azithromycin (ZITHROMAX) 250 MG tablet TAKE 1 TAB DAILY FOR 4 DAYS 11/23/15   Isla Pence, MD  HYDROcodone-acetaminophen (NORCO/VICODIN) 5-325 MG tablet Take 1 tablet by mouth every 4 (four) hours as needed. 11/23/15   Isla Pence, MD  hydrOXYzine (ATARAX/VISTARIL) 10 MG tablet Take 1 tablet (10 mg total) by mouth 3 (three) times daily as needed. Patient not taking: Reported on 11/22/2015 10/29/15   Arnoldo Morale, MD  loratadine (CLARITIN) 10 MG tablet Take 1 tablet (10 mg total) by mouth daily. 11/23/15   Isla Pence, MD   BP 135/82 mmHg  Pulse 86  Temp(Src) 97.8 F (36.6 C) (Oral)  Resp 16  SpO2 94%  LMP 05/24/2003 Physical Exam  Constitutional: She is oriented to person, place, and time. She appears well-developed and well-nourished.  HENT:  Head: Normocephalic and atraumatic.  Right Ear: External ear normal.  Left Ear: External ear normal.  Nose: Nose normal.  Mouth/Throat: Oropharynx is clear and moist.  Eyes: Conjunctivae and EOM are normal. Pupils are equal, round, and reactive to light.  Neck: Normal range of motion. Neck supple.  Cardiovascular: Normal heart sounds and intact distal pulses.  Tachycardia present.    Pulmonary/Chest: She has wheezes.  Abdominal: Soft. Bowel sounds are normal.  Musculoskeletal: Normal range of motion.  Neurological: She is alert and oriented to person, place, and time.  CHRONIC WEAKNESS ON RIGHT.  CHRONIC SLURRED SPEECH.  Skin: Skin is warm and dry.  Psychiatric: She has a normal mood and affect. Her behavior is normal. Judgment and thought content normal.  Nursing note and vitals reviewed.   ED Course  Procedures (including critical care time) Labs Review Labs Reviewed  COMPREHENSIVE METABOLIC PANEL - Abnormal; Notable for the  following:    Potassium 3.2 (*)    Glucose, Bld 102 (*)    ALT 10 (*)    All other components within normal limits  CBC WITH DIFFERENTIAL/PLATELET - Abnormal; Notable for the following:    Hemoglobin 15.1 (*)    All other components within normal limits  CBG MONITORING, ED - Abnormal; Notable for the following:    Glucose-Capillary 101 (*)    All other components within normal limits  TROPONIN I  URINALYSIS, ROUTINE W REFLEX MICROSCOPIC (NOT AT [facility])    Imaging Review Dg Chest 2 View  11/22/2015  CLINICAL DATA:  Right facial droop and right-sided weakness. EXAM: CHEST  2 VIEW COMPARISON:  10/15/2015 FINDINGS: Both lungs are clear. Heart and mediastinum are within normal limits. The trachea is midline. No pleural effusions. No acute bone abnormality. IMPRESSION: No active cardiopulmonary disease. Electronically Signed   By: Markus Daft M.D.   On: 11/22/2015 23:01   Ct Head Wo Contrast  11/23/2015  CLINICAL DATA:  Acute onset of right-sided facial droop and right-sided weakness. Cough and nasal congestion. Initial encounter. EXAM: CT HEAD WITHOUT CONTRAST TECHNIQUE: Contiguous axial images were obtained from the base of the skull through the vertex without intravenous contrast. COMPARISON:  CT of the head performed 06/09/2015 FINDINGS: There is no evidence of acute infarction, mass lesion, or intra- or extra-axial hemorrhage on CT. There  has been interval evolution of the previously noted infarct at the left parietal lobe, extending minimally into the left temporal lobe, with involvement of the left basal ganglia. Underlying encephalomalacia is noted. The brainstem and fourth ventricle are within normal limits. The basal ganglia are unremarkable in appearance. The cerebral hemispheres demonstrate grossly normal gray-white differentiation. No mass effect or midline shift is seen. There is no evidence of fracture; visualized osseous structures are unremarkable in appearance. The orbits are within normal limits. The paranasal sinuses and mastoid air cells are well-aerated. No significant soft tissue abnormalities are seen. IMPRESSION: 1. No acute intracranial pathology seen on CT. 2. Interval evolution of previously noted infarct at the left parietal lobe, extending minimally into the left temporal lobe, with involvement of the left basal ganglia. Underlying chronic encephalomalacia noted. Electronically Signed   By: Garald Balding M.D.   On: 11/23/2015 00:04   I have personally reviewed and evaluated these images and lab results as part of my medical decision-making.   EKG Interpretation None     EKG HR 104.  SINUS TACHY.  NO ST OR T WAVE CHANGES. MDM  PT IS FEELING BETTER.  I SPOKE WITH HER ABOUT SMOKING CESSATION.  I WILL TREAT HER WITH ZITHROMAX AND CLARITIN.  PT IS GIVEN AN INHALER TO GO HOME.  SHE HAS A SPACER AT HOME.  SHE KNOWS TO RETURN IF WORSE. Final diagnoses:  Acute maxillary sinusitis, recurrence not specified  Tobacco abuse  Bronchitis        Isla Pence, MD 11/23/15 [phone]

## 2015-11-23 LAB — URINALYSIS, ROUTINE W REFLEX MICROSCOPIC
BILIRUBIN URINE: NEGATIVE
GLUCOSE, UA: NEGATIVE mg/dL
HGB URINE DIPSTICK: NEGATIVE
Ketones, ur: NEGATIVE mg/dL
Leukocytes, UA: NEGATIVE
Nitrite: NEGATIVE
Protein, ur: NEGATIVE mg/dL
Specific Gravity, Urine: 1.024 (ref 1.005–1.030)
pH: 5.5 (ref 5.0–8.0)

## 2015-11-23 MED ORDER — AZITHROMYCIN 250 MG PO TABS: ORAL_TABLET | ORAL | Status: DC

## 2015-11-23 MED ORDER — AZITHROMYCIN 250 MG PO TABS
500.0000 mg | ORAL_TABLET | Freq: Once | ORAL | Status: AC
  Administered 2015-11-23: 500 mg via ORAL
  Filled 2015-11-23: qty 2

## 2015-11-23 MED ORDER — OXYMETAZOLINE HCL 0.05 % NA SOLN
1.0000 | Freq: Once | NASAL | Status: AC
  Administered 2015-11-23: 1 via NASAL
  Filled 2015-11-23: qty 15

## 2015-11-23 MED ORDER — ALBUTEROL SULFATE HFA 108 (90 BASE) MCG/ACT IN AERS
2.0000 | INHALATION_SPRAY | Freq: Once | RESPIRATORY_TRACT | Status: AC
  Administered 2015-11-23: 2 via RESPIRATORY_TRACT
  Filled 2015-11-23: qty 6.7

## 2015-11-23 MED ORDER — LORATADINE 10 MG PO TABS
10.0000 mg | ORAL_TABLET | Freq: Once | ORAL | Status: AC
  Administered 2015-11-23: 10 mg via ORAL
  Filled 2015-11-23: qty 1

## 2015-11-23 MED ORDER — LORATADINE 10 MG PO TABS: 10.0000 mg | ORAL_TABLET | Freq: Every day | ORAL | Status: DC

## 2015-11-23 MED ORDER — OXYCODONE-ACETAMINOPHEN 5-325 MG PO TABS
1.0000 | ORAL_TABLET | Freq: Once | ORAL | Status: AC
  Administered 2015-11-23: 1 via ORAL
  Filled 2015-11-23: qty 1

## 2015-11-23 MED ORDER — HYDROCODONE-ACETAMINOPHEN 5-325 MG PO TABS: 1.0000 | ORAL_TABLET | ORAL | Status: DC | PRN

## 2015-12-07 IMAGING — CR DG CHEST 2V
2 series · 2 of 2 positions shown · non-contrast
Comparison: 02/23/2014

CLINICAL DATA: One pack-per-day tobacco use, cough and mid chest
pain, known history of pulmonary embolism

EXAM:
CHEST  2 VIEW

[w chest pa]
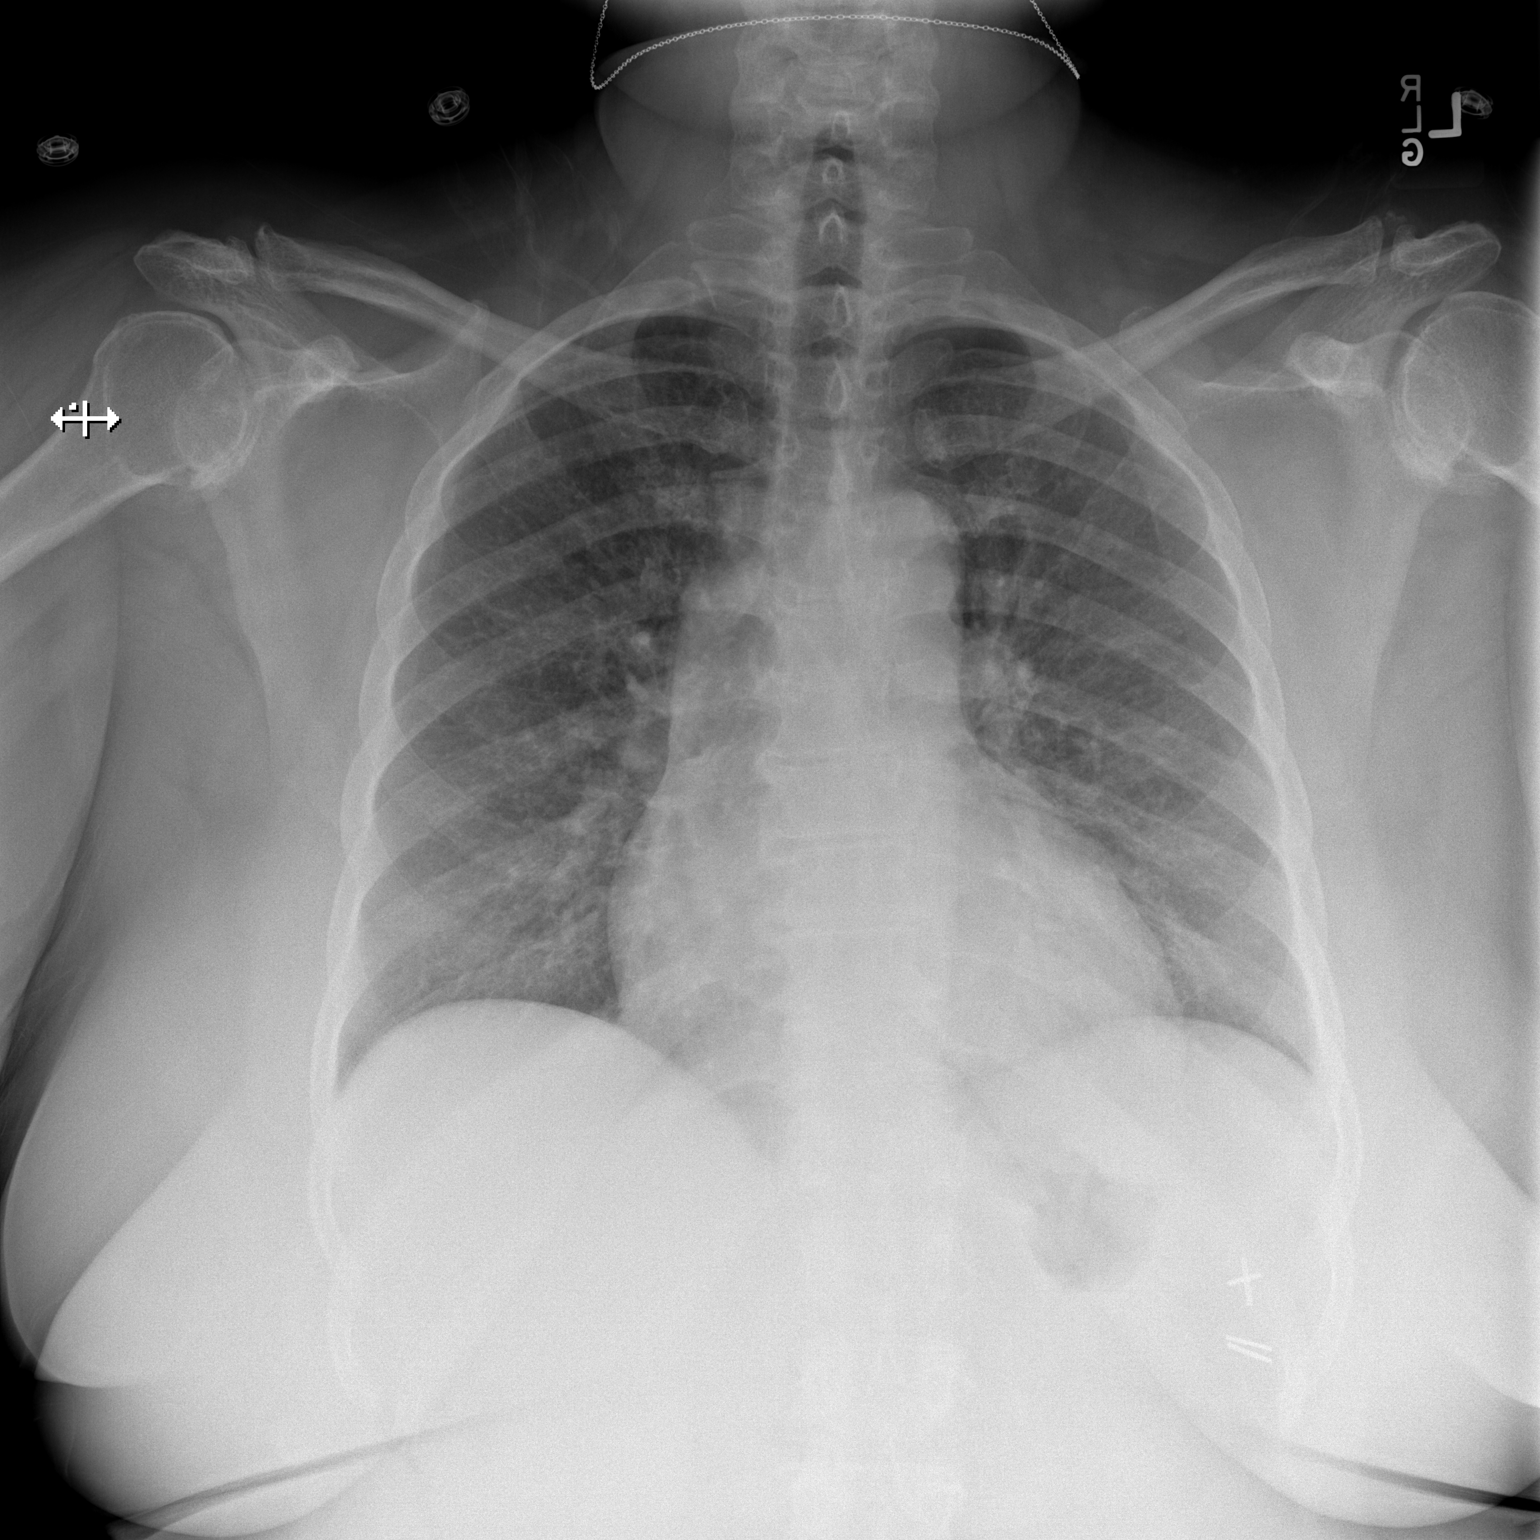

[w chest lat]
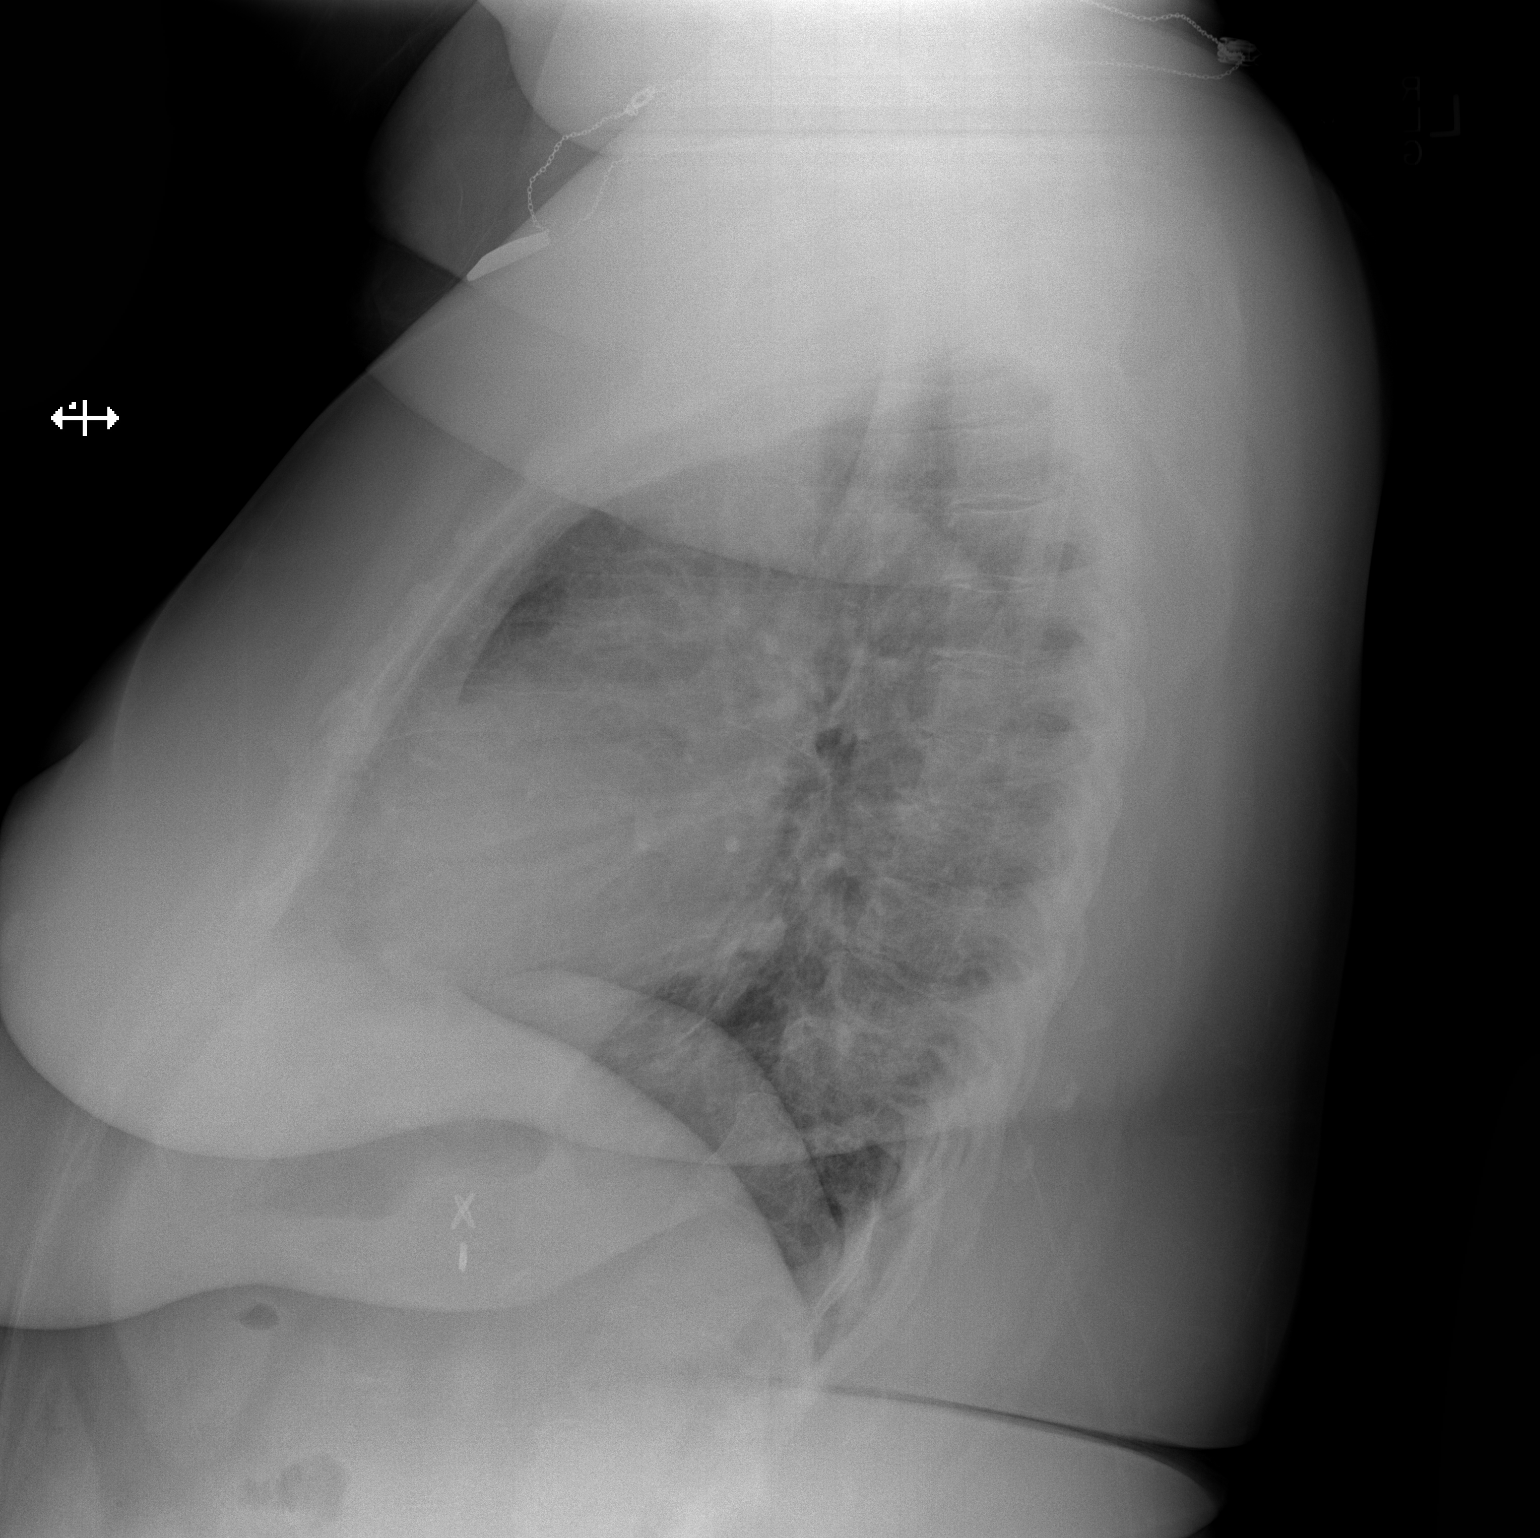

[2 of 2 positions shown; findings below may reference images not displayed]

FINDINGS: Cardiac shadow is stable but mildly enlarged. The lungs are well
aerated bilaterally without focal infiltrate or sizable effusion.
Mild degenerative change of the thoracic spine is noted.
IMPRESSION: No acute abnormality is noted.

## 2015-12-17 ENCOUNTER — Encounter (HOSPITAL_COMMUNITY): Payer: Self-pay | Admitting: Emergency Medicine

## 2015-12-17 ENCOUNTER — Emergency Department (HOSPITAL_COMMUNITY)
Admission: EM | Admit: 2015-12-17 | Discharge: 2015-12-17 | Disposition: A | Discharge status: Home / Self Care | Payer: Medicare HMO | Attending: Dermatology | Admitting: Dermatology

## 2015-12-17 DIAGNOSIS — Z5321 Procedure and treatment not carried out due to patient leaving prior to being seen by health care provider: Secondary | ICD-10-CM | POA: Insufficient documentation

## 2015-12-17 DIAGNOSIS — R05 Cough: Secondary | ICD-10-CM | POA: Insufficient documentation

## 2015-12-17 NOTE — ED Notes (Signed)
Pt c/o facial pain and productive cough x1 month. Hx stroke. Slurred speech and right arm weakness from previous stroke. Denies headache, dizziness, N/V/D.

## 2015-12-17 NOTE — ED Notes (Signed)
Pt left prior to getting room assignment

## 2015-12-18 ENCOUNTER — Encounter (HOSPITAL_BASED_OUTPATIENT_CLINIC_OR_DEPARTMENT_OTHER): Payer: Medicare HMO

## 2016-01-21 ENCOUNTER — Other Ambulatory Visit: Payer: Self-pay | Admitting: Family Medicine

## 2016-01-21 DIAGNOSIS — R569 Unspecified convulsions: Secondary | ICD-10-CM

## 2016-01-21 DIAGNOSIS — I63512 Cerebral infarction due to unspecified occlusion or stenosis of left middle cerebral artery: Secondary | ICD-10-CM

## 2016-02-24 IMAGING — CR DG CHEST 2V
2 series · 2 of 2 positions shown · non-contrast
Comparison: 03/28/2014 chest x-ray.  02/24/2014 chest CT.

CLINICAL DATA: 58-year-old female with right abdominal and chest
pain. Diarrhea. Initial encounter.

EXAM:
CHEST  2 VIEW

[w chest pa]
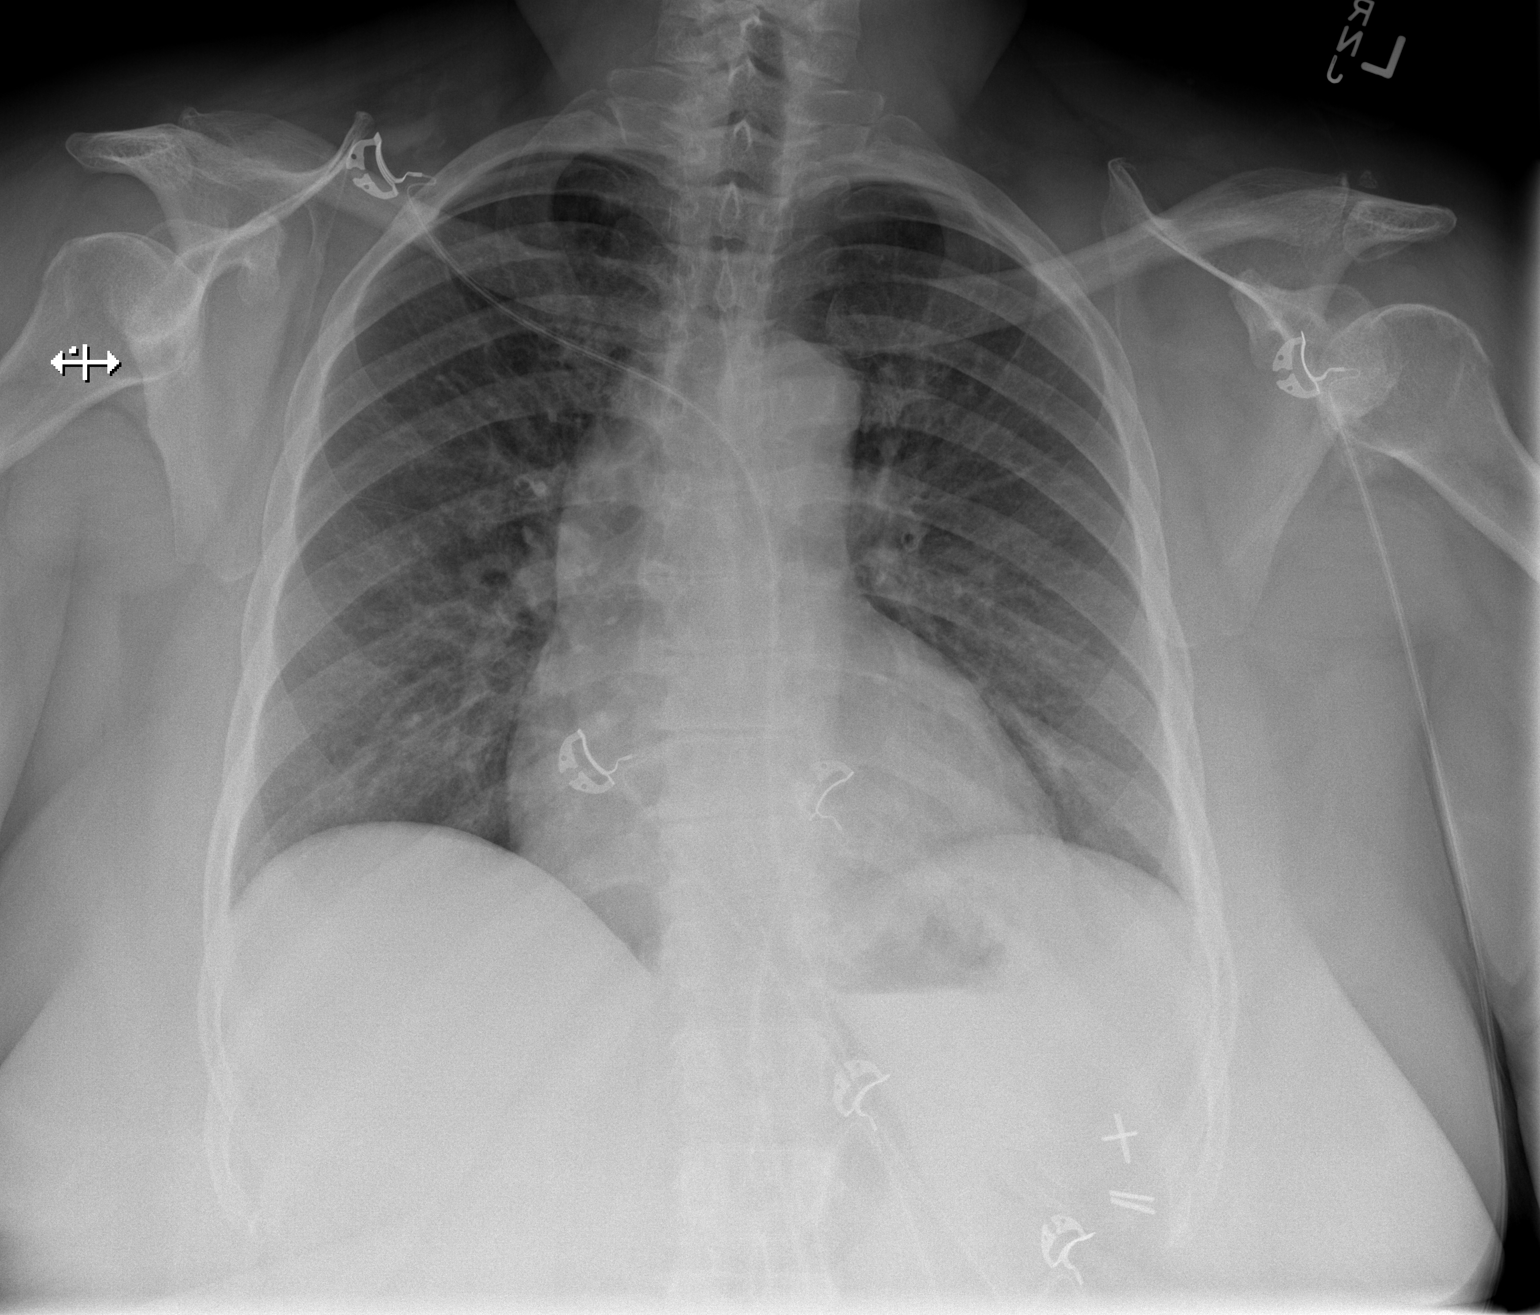

[w chest lat]
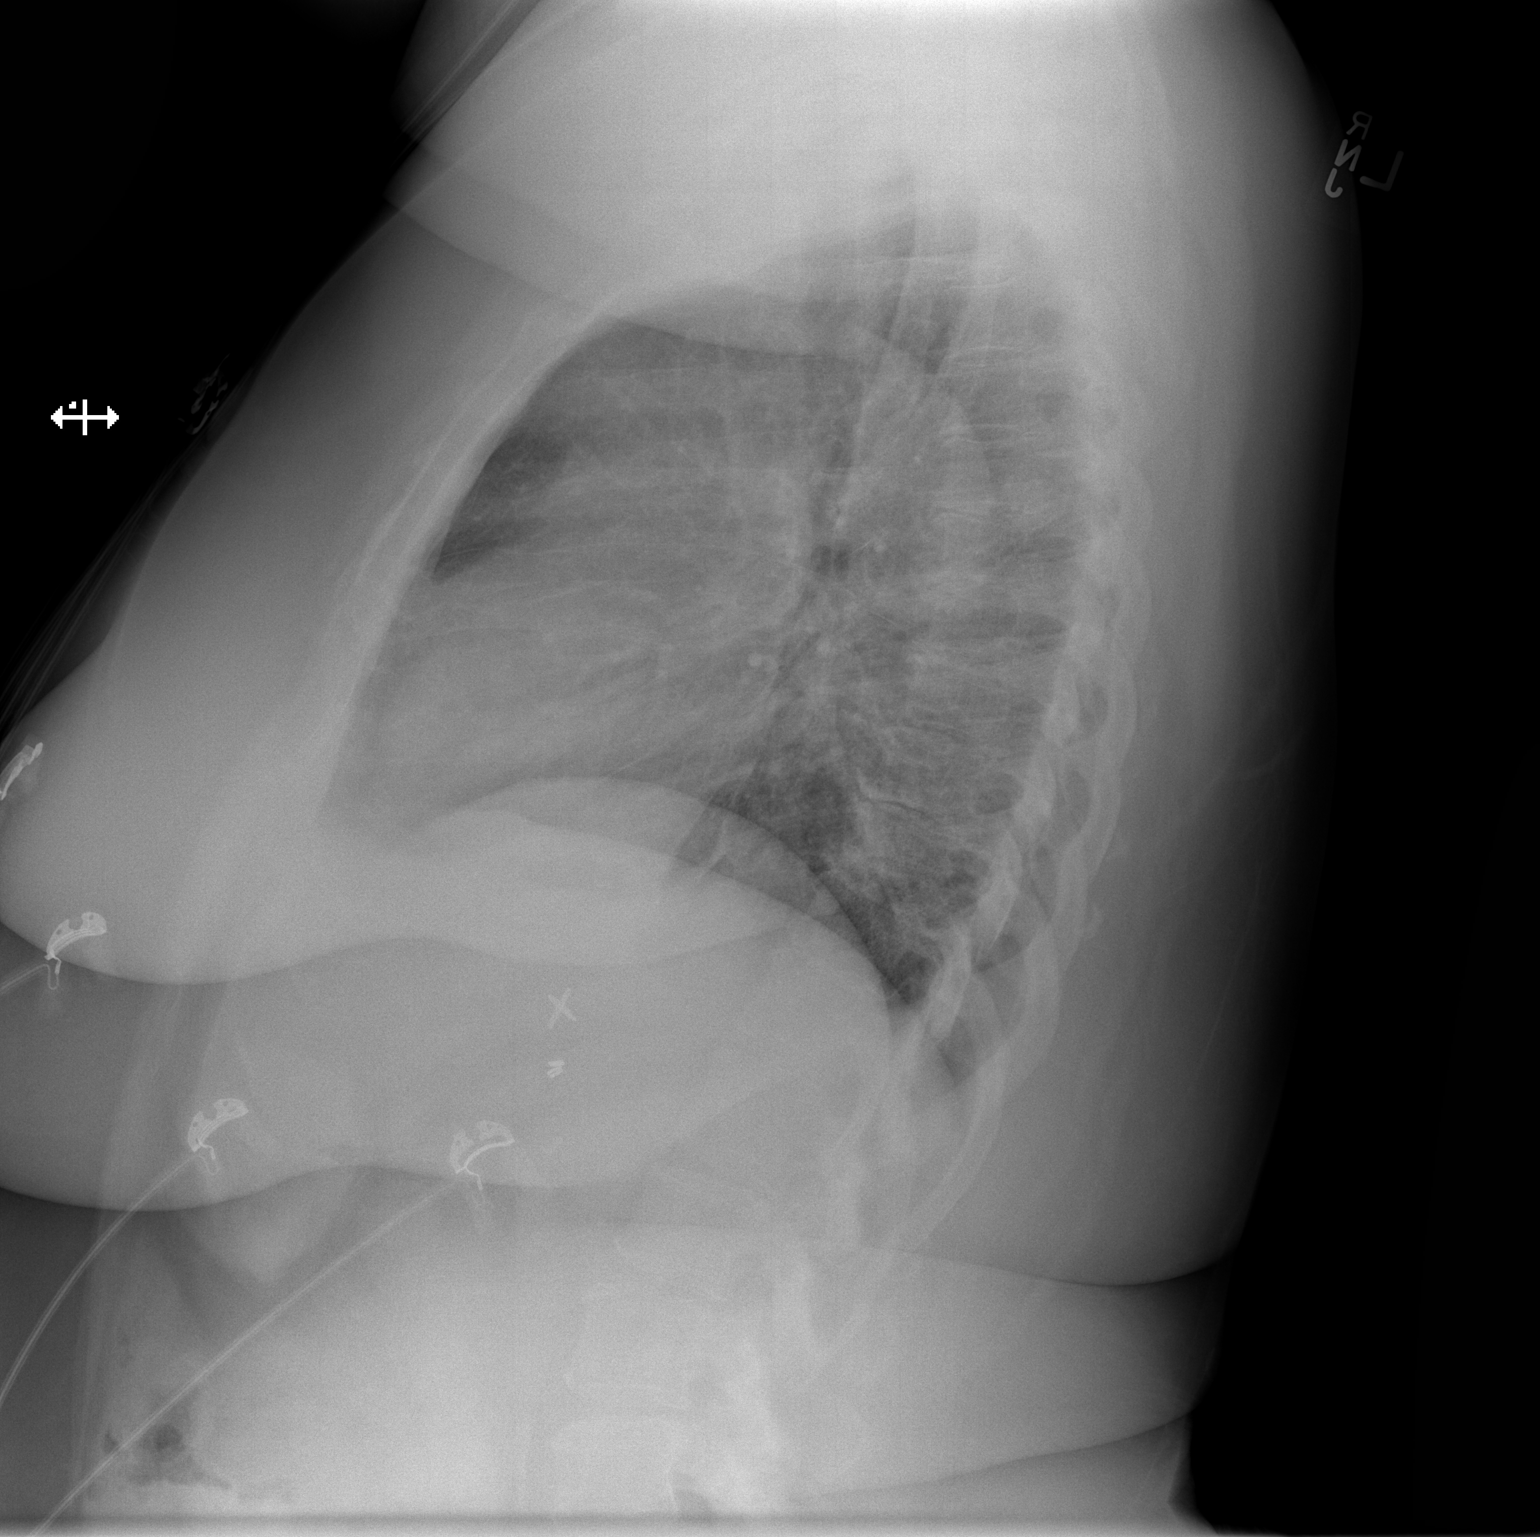

[2 of 2 positions shown; findings below may reference images not displayed]

FINDINGS: Mild cardiomegaly.

Pulmonary vascular prominence most notable centrally.

No segmental consolidation or pneumothorax.

Limited by plain film examination for evaluating for pulmonary
embolus (which patient was noted to have on prior CT).

Mildly tortuous aorta.
IMPRESSION: Mild cardiomegaly.

Pulmonary vascular prominence most notable centrally.

No segmental consolidation.

Limited by plain film examination for evaluating for pulmonary
embolus (which patient was noted to have on prior CT).

Mildly tortuous aorta.

## 2016-02-24 IMAGING — CT CT ABD-PELV W/ CM
1 of 2 series · 15 of 32 positions shown, 19 images · IV contrast (OMNIPAQUE 300)
Comparison: 03/18/2006 CT.

CLINICAL DATA: 58-year-old female with right mid to lower abdominal
pain for 2 days. Decreased appetite over the past couple of months.
Recent diarrhea. History of colon cancer (post partial colonic
resection), hypertension, right lower extremity deep venous
thrombosis, obesity. Initial encounter.

EXAM:
CT ABDOMEN AND PELVIS WITH CONTRAST
TECHNIQUE: Multidetector CT imaging of the abdomen and pelvis was performed
using the standard protocol following bolus administration of
intravenous contrast.
CONTRAST:  50mL OMNIPAQUE IOHEXOL 300 MG/ML SOLN, 100mL OMNIPAQUE
IOHEXOL 300 MG/ML SOLN

[Series 2: abd/pel with · axial · 0.74mm/px · z∈[+1109,+1494]mm · 15 of 85 slices shown, 19 images]
[im 4/85  soft-tissue]
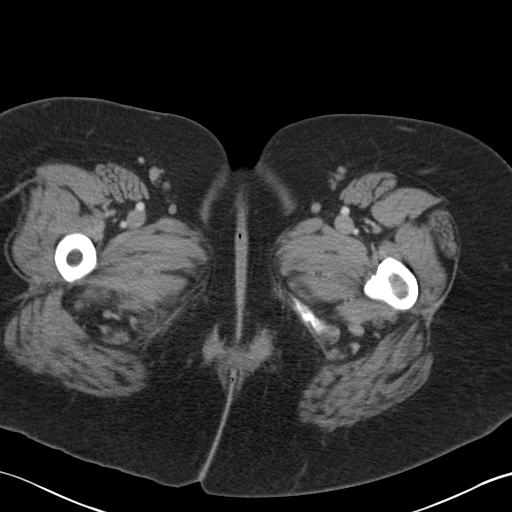
[im 4/85  bone]
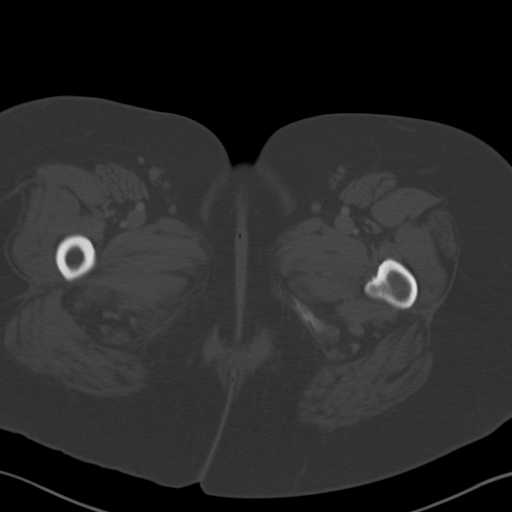
[im 11/85  soft-tissue]
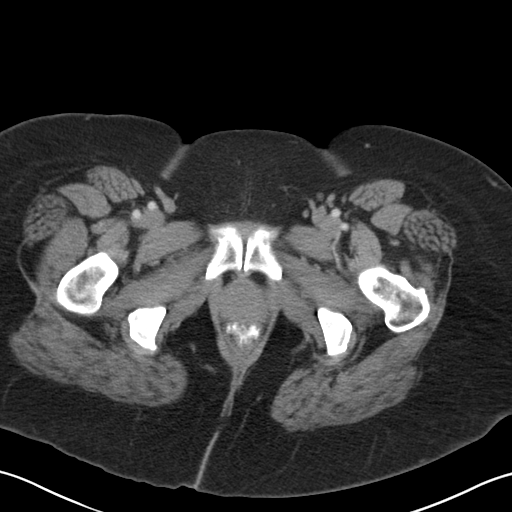
[im 19/85  soft-tissue]
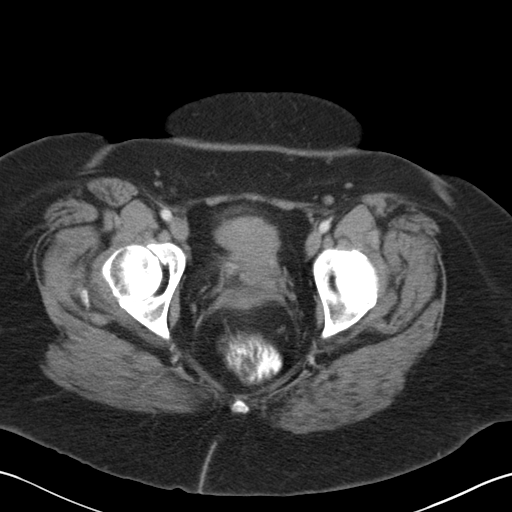
[im 22/85  soft-tissue]
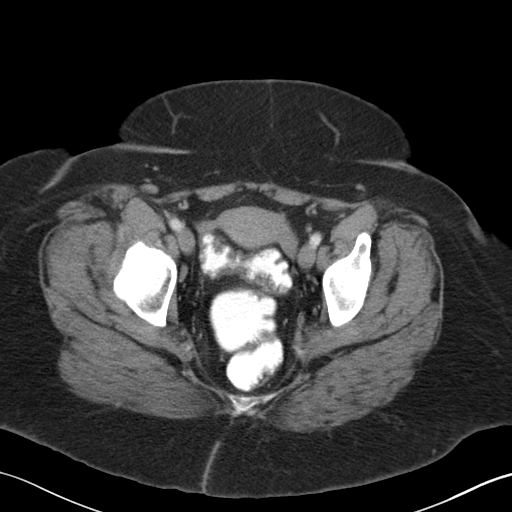
[im 30/85  soft-tissue]
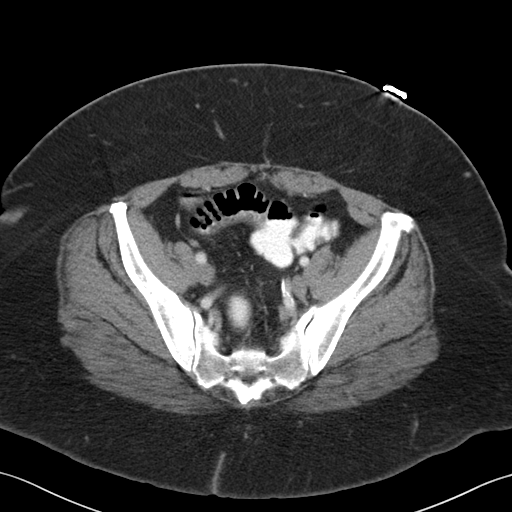
[im 37/85  soft-tissue]
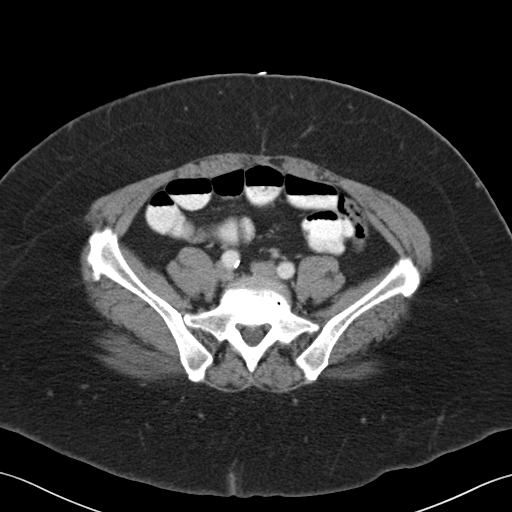
[im 44/85  soft-tissue]
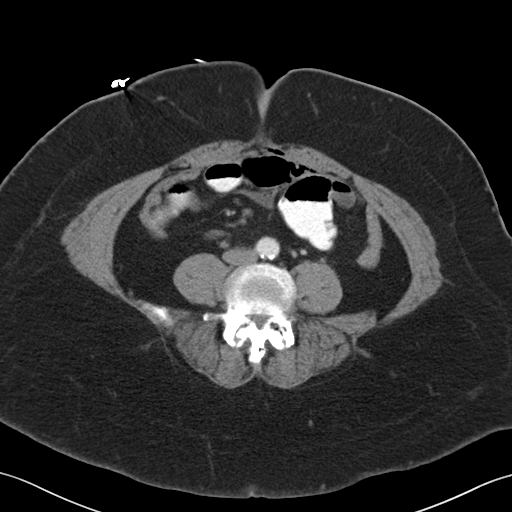
[im 48/85  soft-tissue]
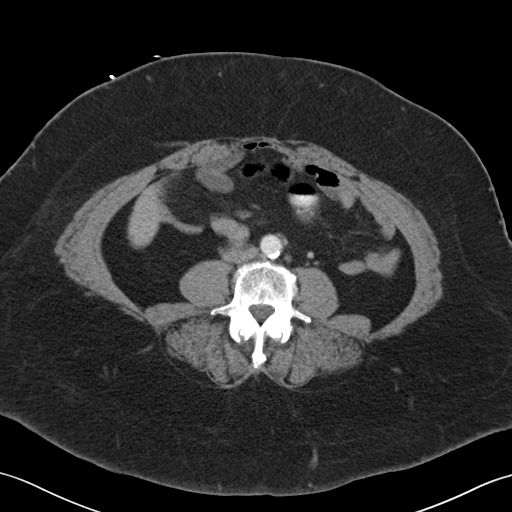
[im 55/85  soft-tissue]
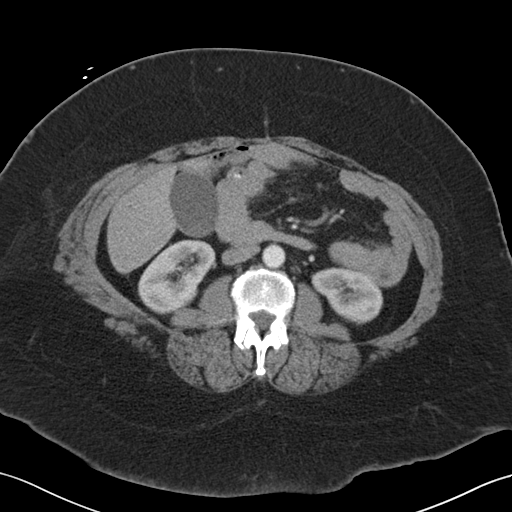
[im 55/85  bone]
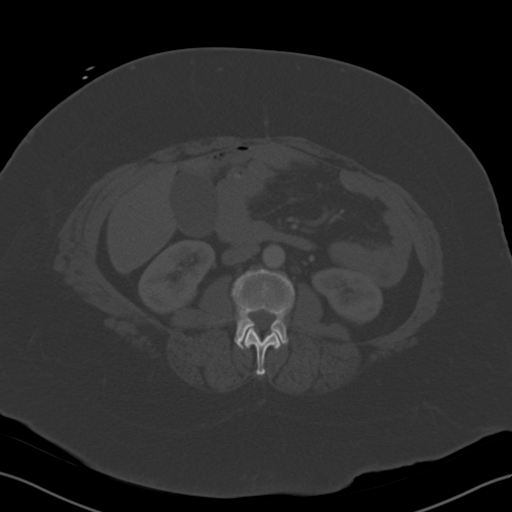
[im 63/85  soft-tissue]
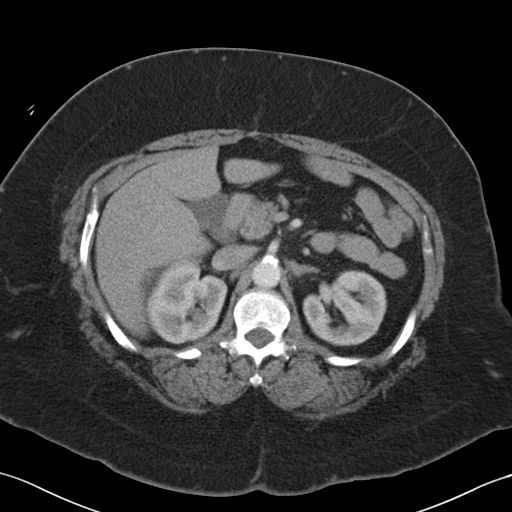
[im 66/85  soft-tissue]
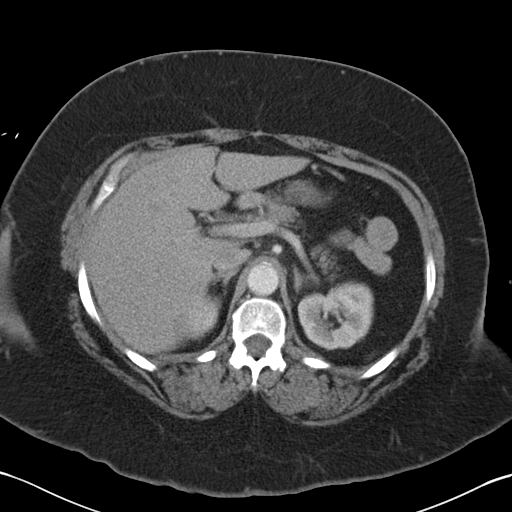
[im 70/85  lung]
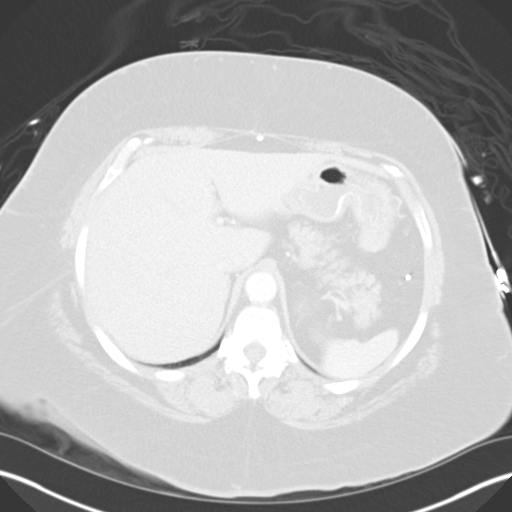
[im 74/85  soft-tissue]
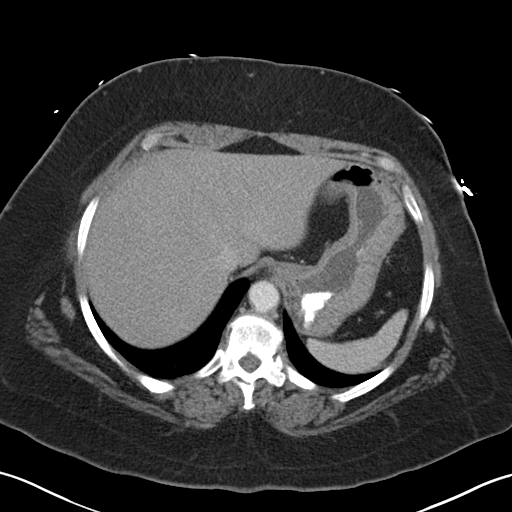
[im 74/85  lung]
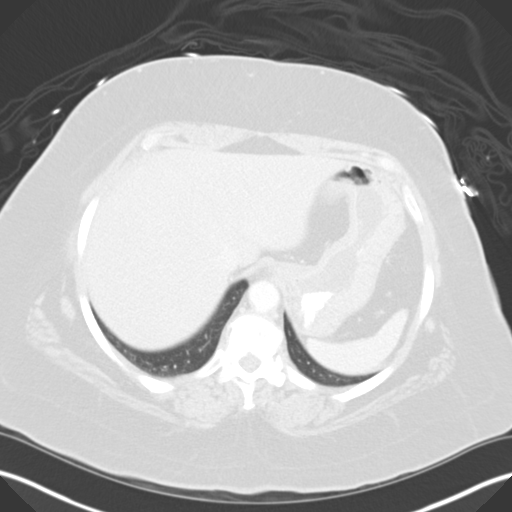
[im 77/85  lung]
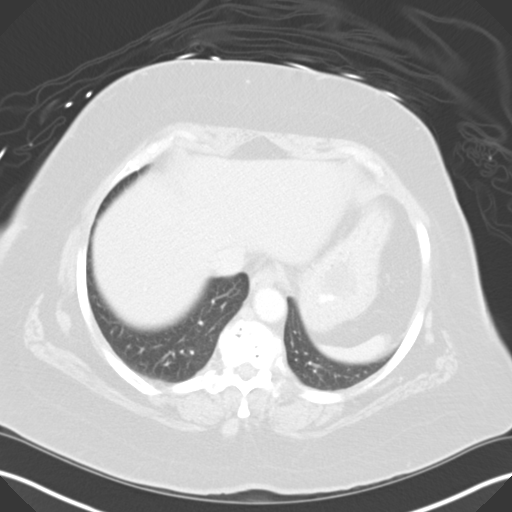
[im 81/85  soft-tissue]
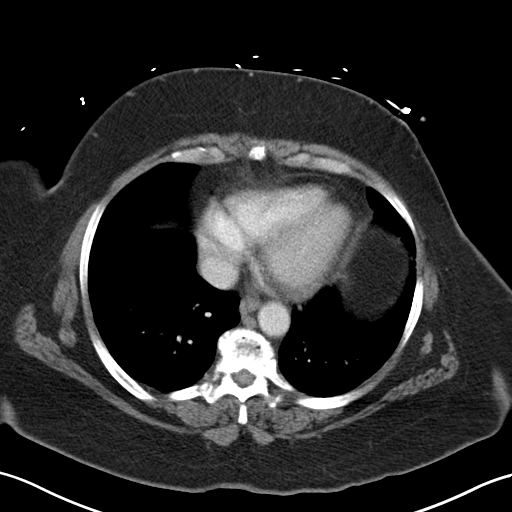
[im 81/85  lung]
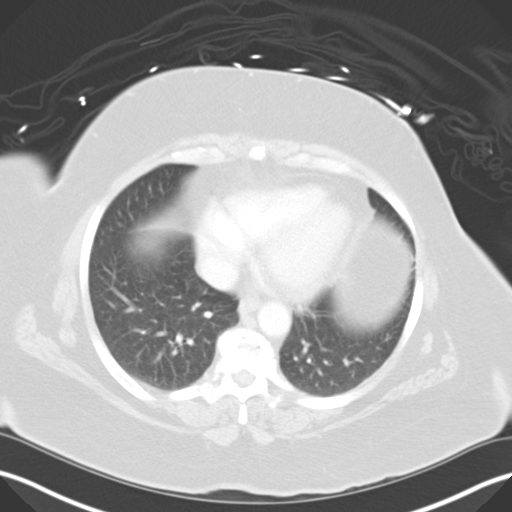

[15 of 32 positions shown; findings below may reference images not displayed]

FINDINGS: Lung bases clear.  Enlarged heart.

Atherosclerotic type changes aorta, aortic branch vessels and iliac
arteries with areas of mild narrowing and slight ectasia without
abdominal aortic aneurysm or high-grade stenosis.

Post resection of colon with reanastomosis rectosigmoid region
without obvious mass, extra luminal bowel inflammatory process, free
fluid or free air. Mild diastases rectus muscles without bowel
containing hernia.

No adenopathy.

Degenerative changes thoracic and lumbar spine with various degrees
of spinal stenosis and foraminal narrowing without osseous
destructive lesion. Sacroiliac joint degenerative changes.

Contracted urinary bladder. No obvious uterine or adnexal
abnormality.

Fatty liver without focal worrisome hepatic, splenic, pancreatic,
adrenal or renal lesion. Left renal 2.4 cm cyst and possibly sub cm
cyst (too small to characterize).
IMPRESSION: Post resection of colon with reanastomosis rectosigmoid region
without obvious mass, extra luminal bowel inflammatory process, free
fluid or free air.

No adenopathy.

Degenerative changes thoracic and lumbar spine with various degrees
of spinal stenosis and foraminal narrowing without osseous
destructive lesion. Sacroiliac joint degenerative changes.

Fatty liver.

Left renal 2.4 cm cyst and possibly sub cm cyst (too small to
characterize).

## 2016-02-24 IMAGING — CT CT ANGIO CHEST
1 of 2 series · 19 of 32 positions shown · IV contrast (OMNIPAQUE 350)
Comparison: CT chest 02/24/2014.

CLINICAL DATA: Left chest and arm pain.

EXAM:
CT ANGIOGRAPHY CHEST WITH CONTRAST
TECHNIQUE: Multidetector CT imaging of the chest was performed using the
standard protocol during bolus administration of intravenous
contrast. Multiplanar CT image reconstructions and MIPs were
obtained to evaluate the vascular anatomy.
CONTRAST:  100 mL OMNIPAQUE IOHEXOL 350 MG/ML SOLN

[Series 6: thins for pacs · axial · 0.65mm/px · z∈[-208,-10]mm · 19 of 222 slices shown]
[im 12/222  lung]
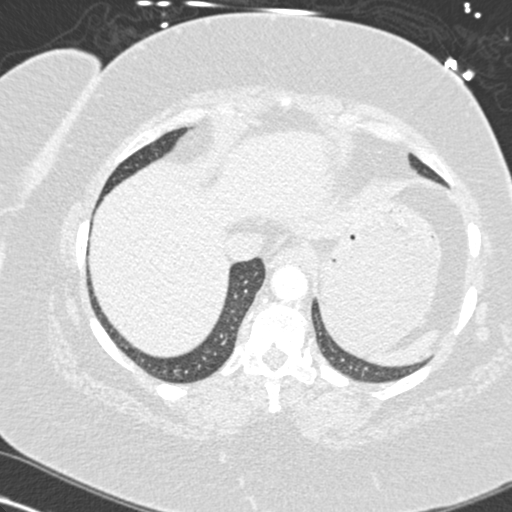
[im 23/222  mediastinal]
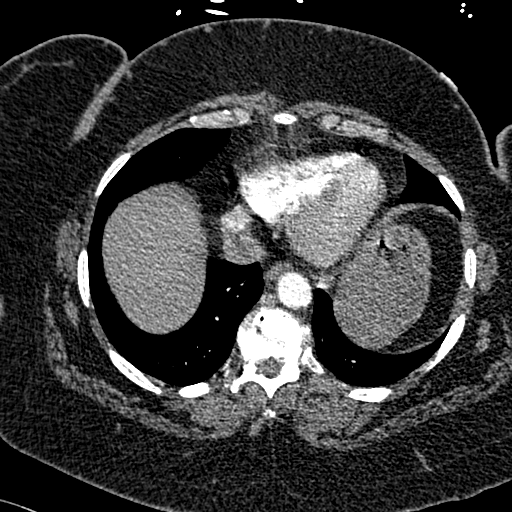
[im 34/222  lung]
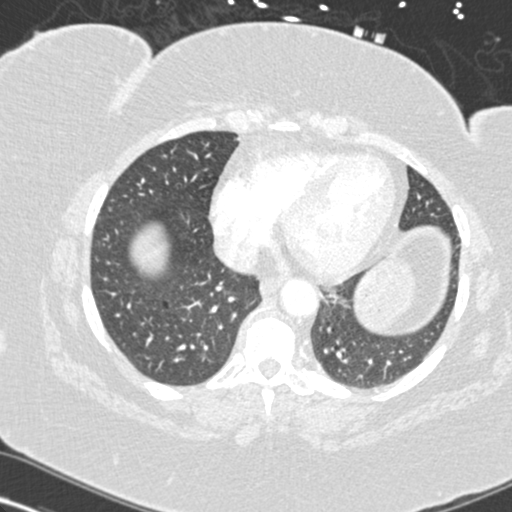
[im 56/222  mediastinal]
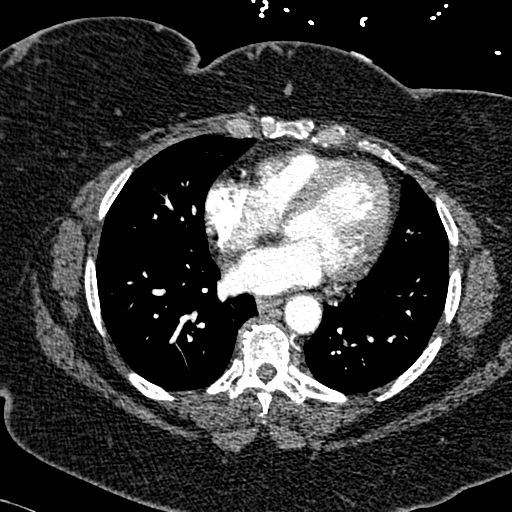
[im 67/222  lung]
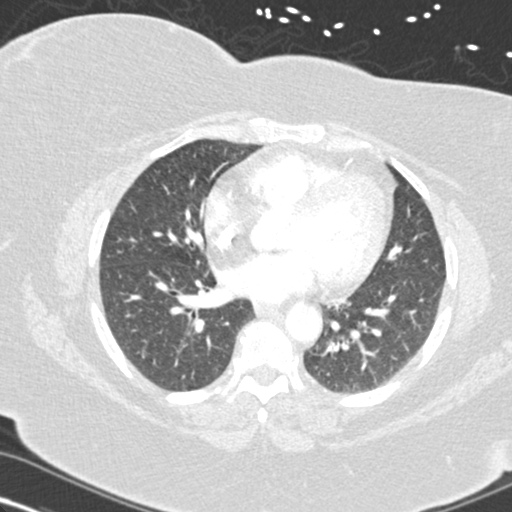
[im 74/222  mediastinal]
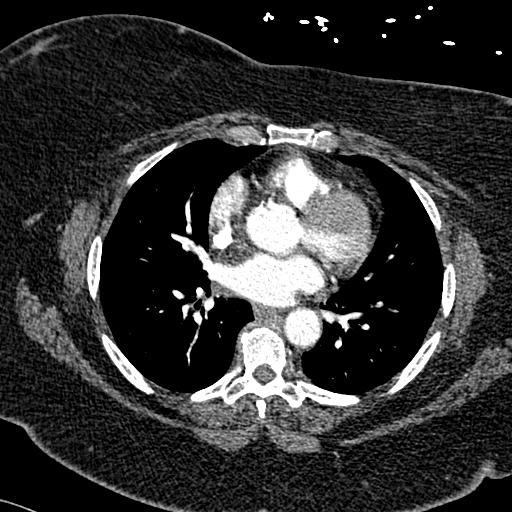
[im 78/222  lung]
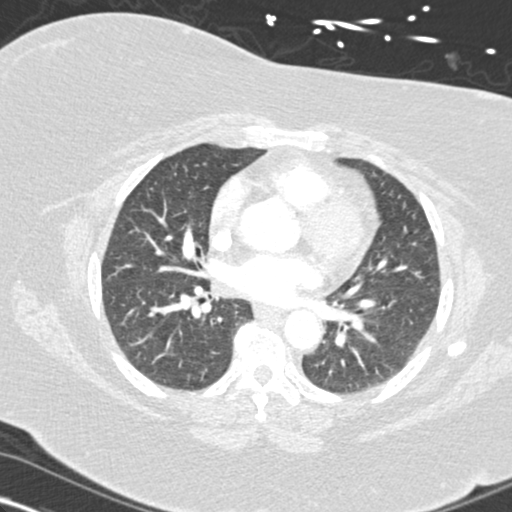
[im 89/222  mediastinal]
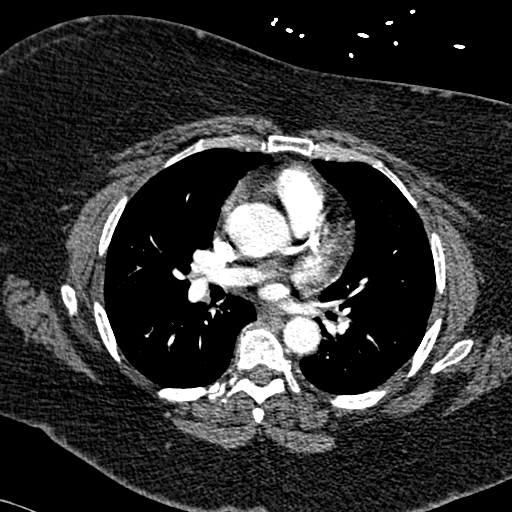
[im 100/222  lung]
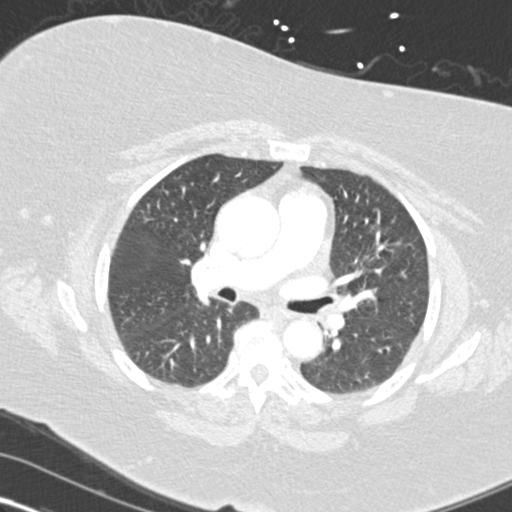
[im 111/222  mediastinal]
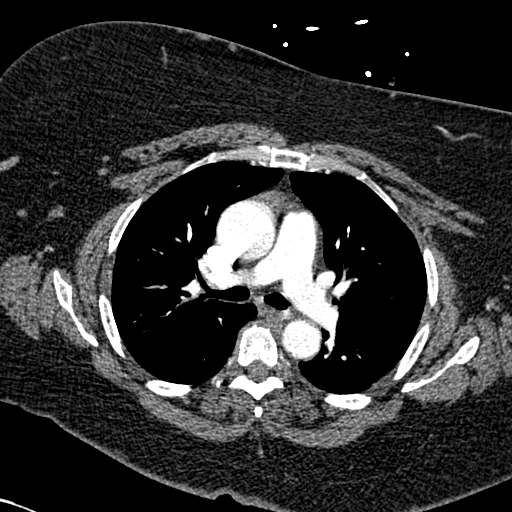
[im 122/222  lung]
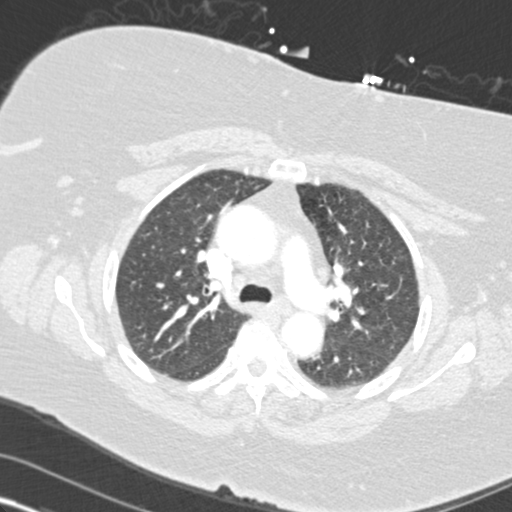
[im 133/222  mediastinal]
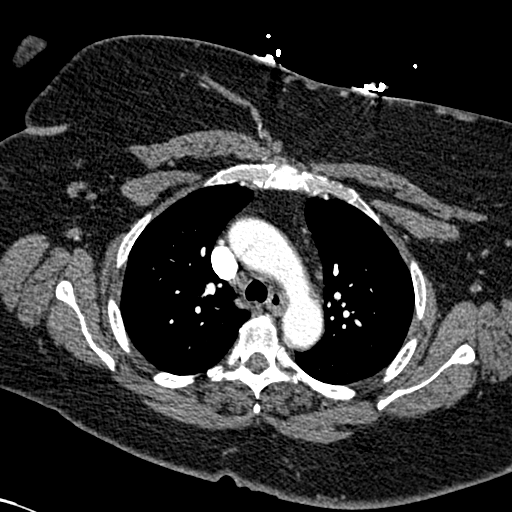
[im 144/222  lung]
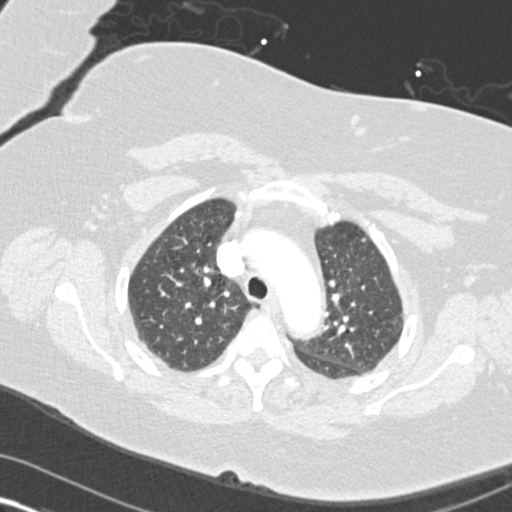
[im 148/222  mediastinal]
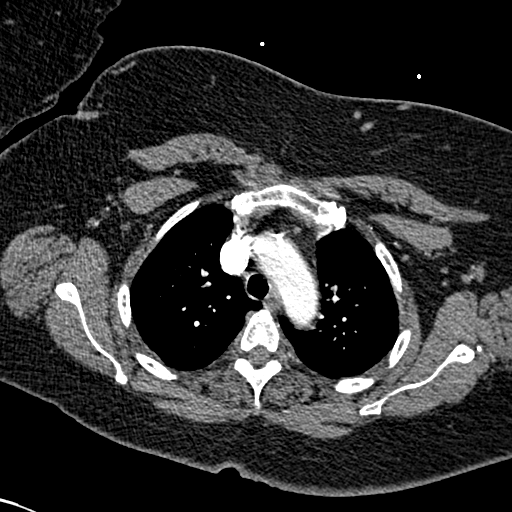
[im 155/222  lung]
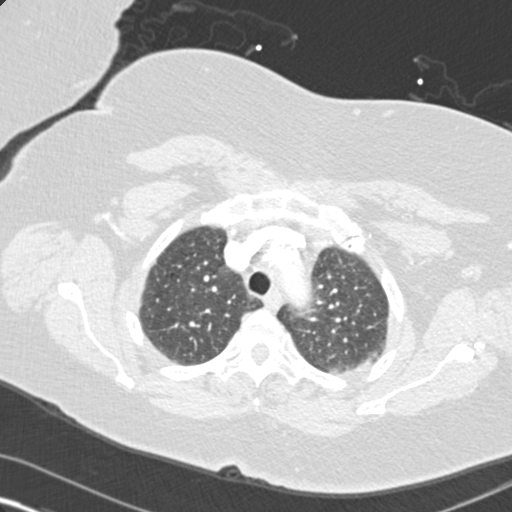
[im 166/222  mediastinal]
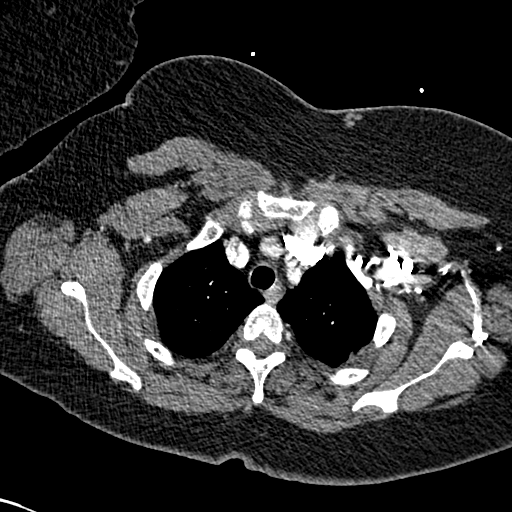
[im 188/222  lung]
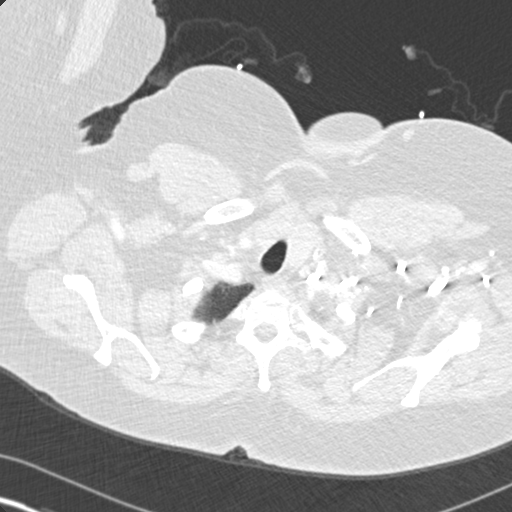
[im 199/222  mediastinal]
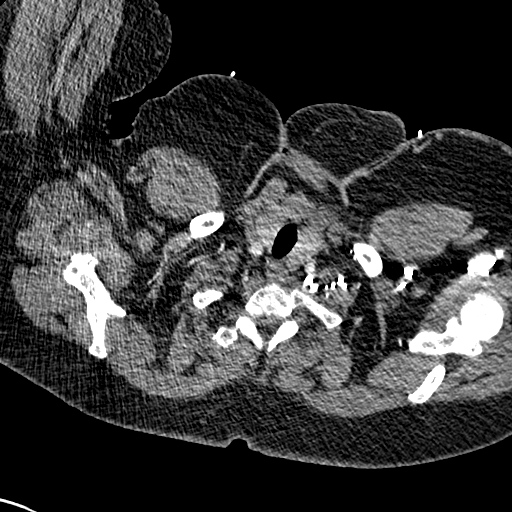
[im 210/222  lung]
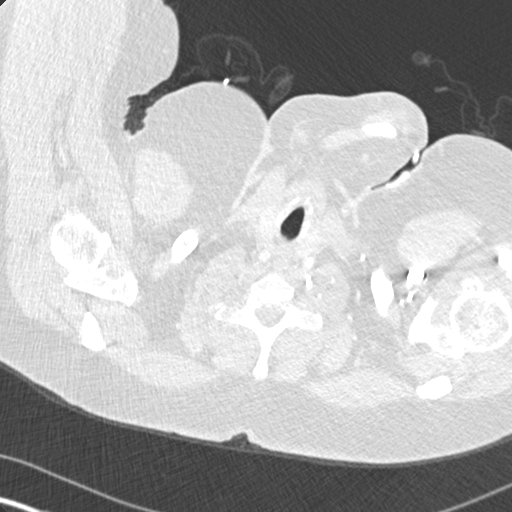

[19 of 32 positions shown; findings below may reference images not displayed]

FINDINGS: No pulmonary embolus is identified. Previously seen pulmonary emboli
are no longer identified. Heart size is upper normal. No pleural or
pericardial effusion. There is no axillary, hilar or mediastinal
lymphadenopathy. The lungs are clear. Visualized upper abdomen shows
no focal abnormality. Thoracic spondylosis is noted. Degenerative
disease is also seen about the shoulders. No lytic or sclerotic bony
lesion is identified.

Review of the MIP images confirms the above findings.
IMPRESSION: Negative for pulmonary embolus. Pulmonary emboli seen on the prior
examination or are no longer identified. No acute finding or finding
to explain the patient's symptoms.

Thoracic spondylosis and degenerative change about the shoulders.

## 2016-02-25 ENCOUNTER — Other Ambulatory Visit: Payer: Self-pay | Admitting: Family Medicine

## 2016-02-25 DIAGNOSIS — G629 Polyneuropathy, unspecified: Secondary | ICD-10-CM

## 2016-03-05 IMAGING — CT CT NECK W/ CM
2 of 3 series · 8 of 14 positions shown, 9 images · IV contrast (omnipaque)
Comparison: MRI of the cervical spine July 21, 2013

CLINICAL DATA: Throat swelling and pain beginning yesterday.
Difficulty speaking. Swelling. Shortness of breath.

EXAM:
CT NECK WITH CONTRAST
TECHNIQUE: Multidetector CT imaging of the neck was performed using the
standard protocol following the bolus administration of intravenous
contrast.
CONTRAST:  100mL OMNIPAQUE IOHEXOL 300 MG/ML  SOLN

[Series 3: neck with st · axial · 0.39mm/px · z∈[-276,-138]mm · 4 of 117 slices shown]
[im 24/117  bone]
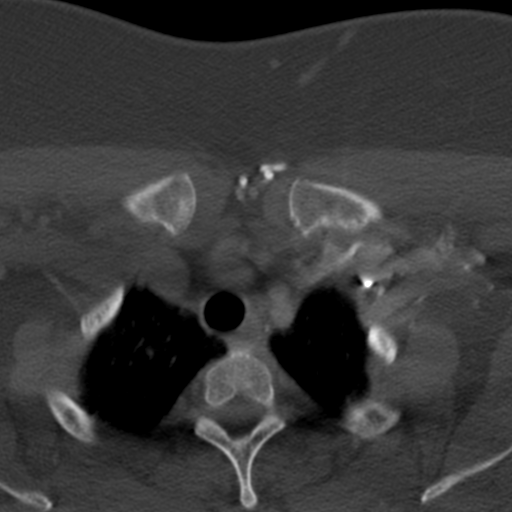
[im 47/117  bone]
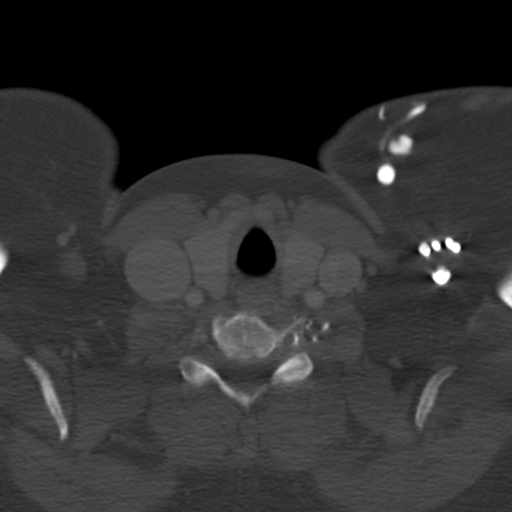
[im 70/117  bone]
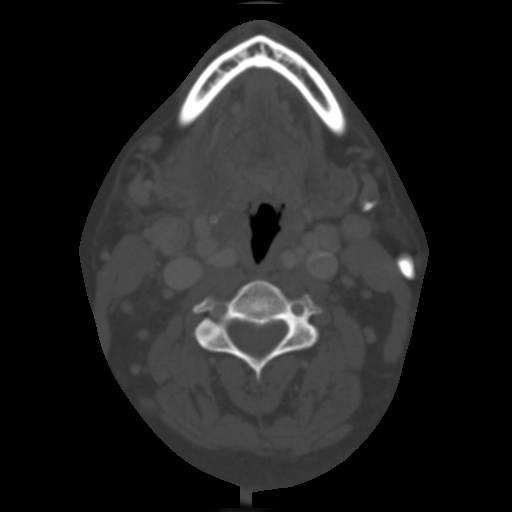
[im 93/117  bone]
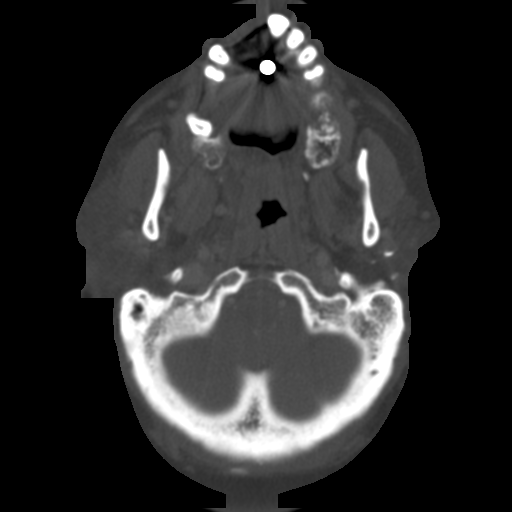

[Series 8: axial recons · axial · 0.39mm/px · z∈[-305,-166]mm · 4 of 120 slices shown, 5 images]
[im 24/120  soft-tissue]
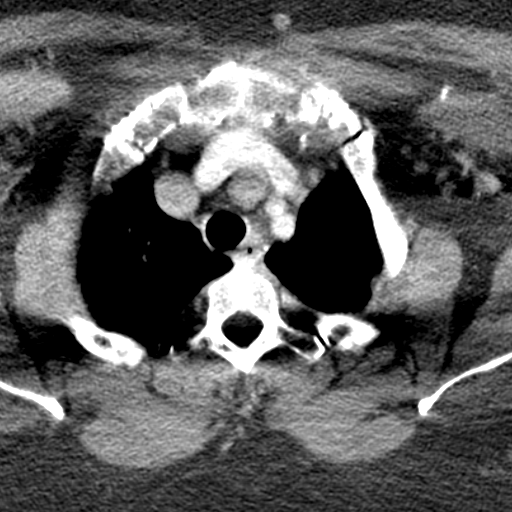
[im 24/120  bone]
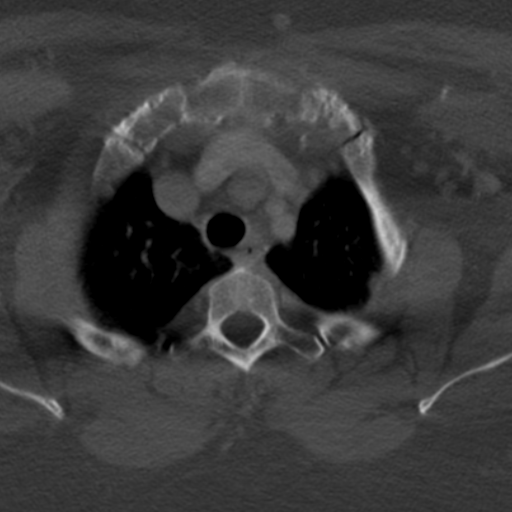
[im 48/120  bone]
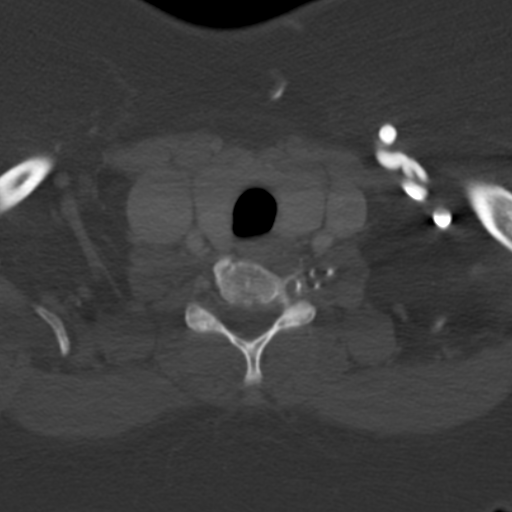
[im 72/120  bone]
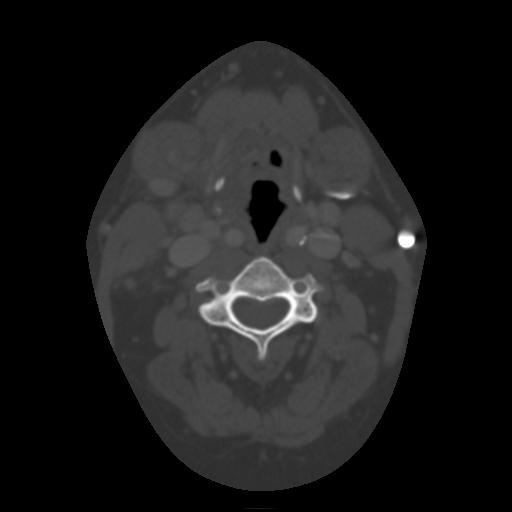
[im 96/120  bone]
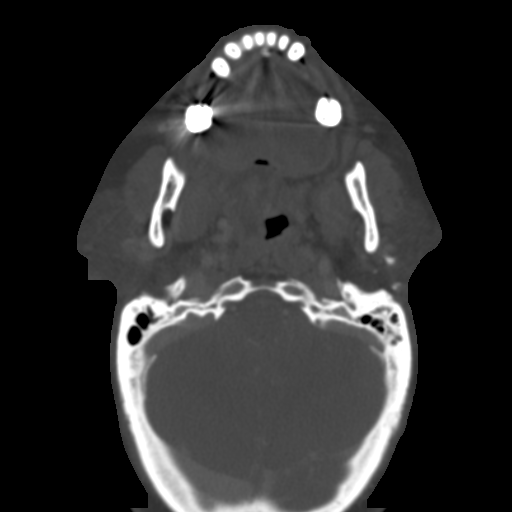

[8 of 14 positions shown; findings below may reference images not displayed]

FINDINGS: Pharynx and larynx: Enlarged bilateral palatine tonsils, with
striated enhancement. Superimposed 11 x 17 mm rim enhancing RIGHT
peritonsillar abscess extending laterally. Effusion within the RIGHT
submandibular space. Hypo pharyngeal edema, partially effacing the
airway. However, epiglottis is not enlarged. Larynx is unremarkable.

Salivary glands: RIGHT submandibular space effusion, no CT findings
of sialoadenitis. Thyroid gland appears mildly enlarged, no dominant
nodule.

Lymph nodes: Lymphadenopathy, 14 mm short axis RIGHT level IIa lymph
node. 9 mm LEFT level access to a lymph node.

Vascular: Mild calcific atherosclerosis of the LEFT carotid bulb.
RIGHT internal jugular vein is dominant, and patent.

Limited intracranial: Normal. Poor dentition with multiple dental
caries in periapical lucencies.

Mastoids and visualized paranasal sinuses: Well aerated.

Skeleton: Mild degenerative changes cervical spine without
destructive bony lesions.

Upper chest: Normal.
IMPRESSION: Acute tonsillitis partially effacing the airway. Superimposed RIGHT
peritonsillar 11 x 17 mm abscess. Edema and effusion, sparing of the
epiglottis.

Cervical lymphadenopathy is likely reactive.

  By: Danii Aujla

## 2016-03-07 IMAGING — CR DG KNEE COMPLETE 4+V*L*
4 series · 4 of 4 positions shown · non-contrast
Comparison: Left knee radiographs 05/12/2012.

CLINICAL DATA: Persistent anterolateral knee pain after falling 1
year ago.

EXAM:
LEFT KNEE - COMPLETE 4+ VIEW

[knee ap]
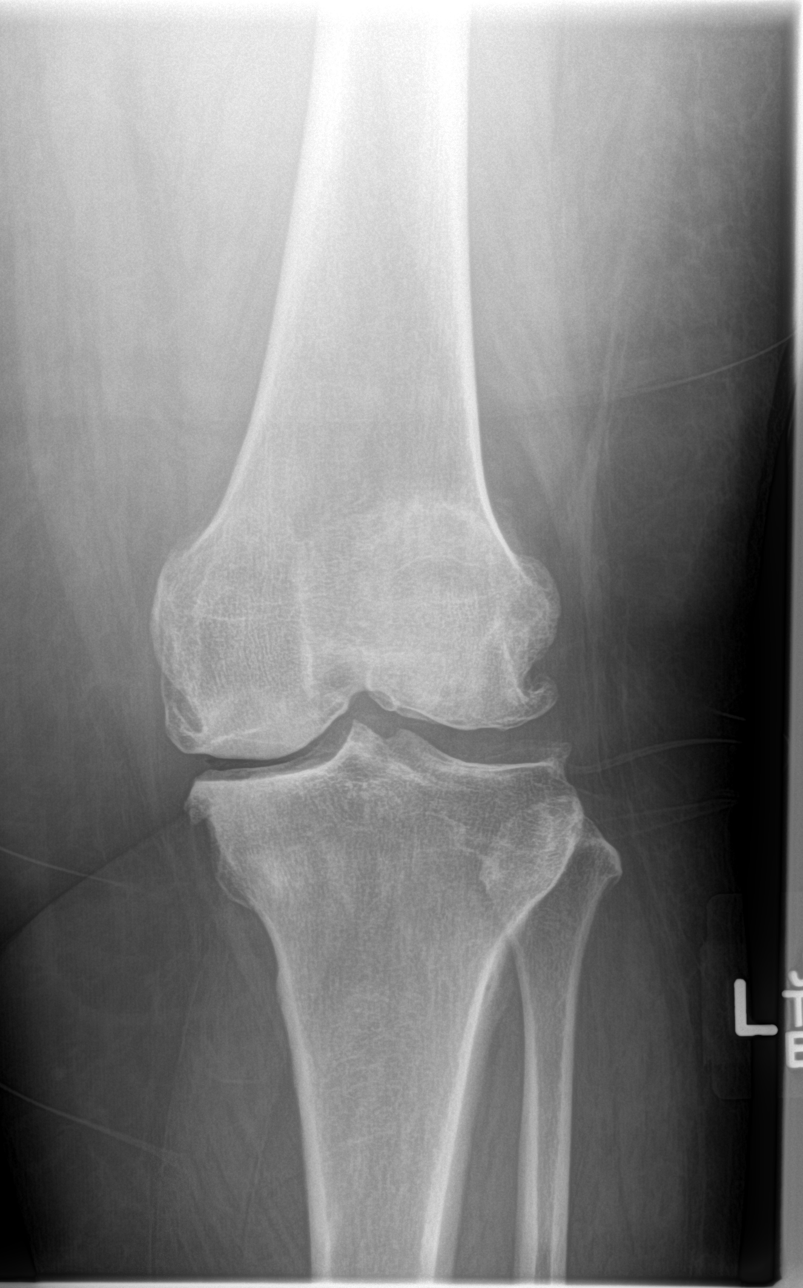

[knee obl (1 of 2)]
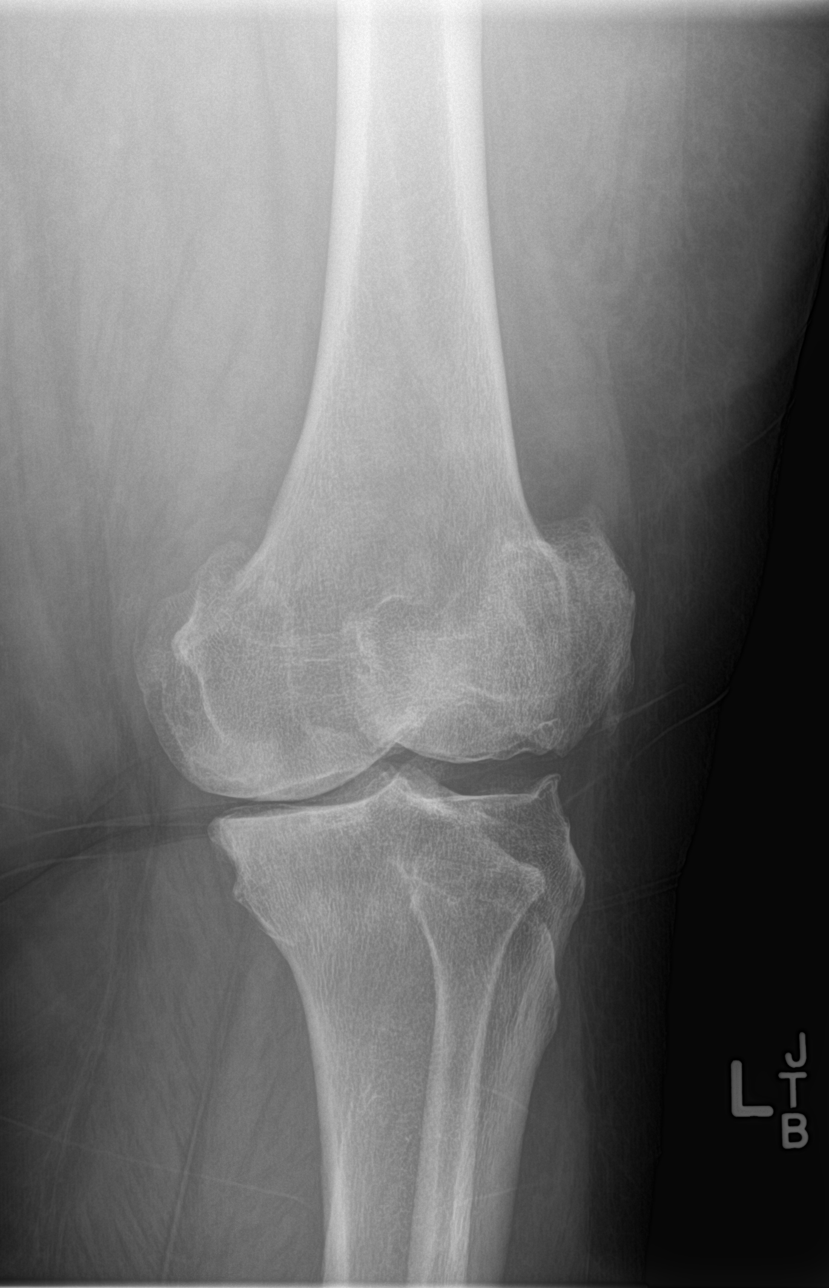

[knee obl (2 of 2)]
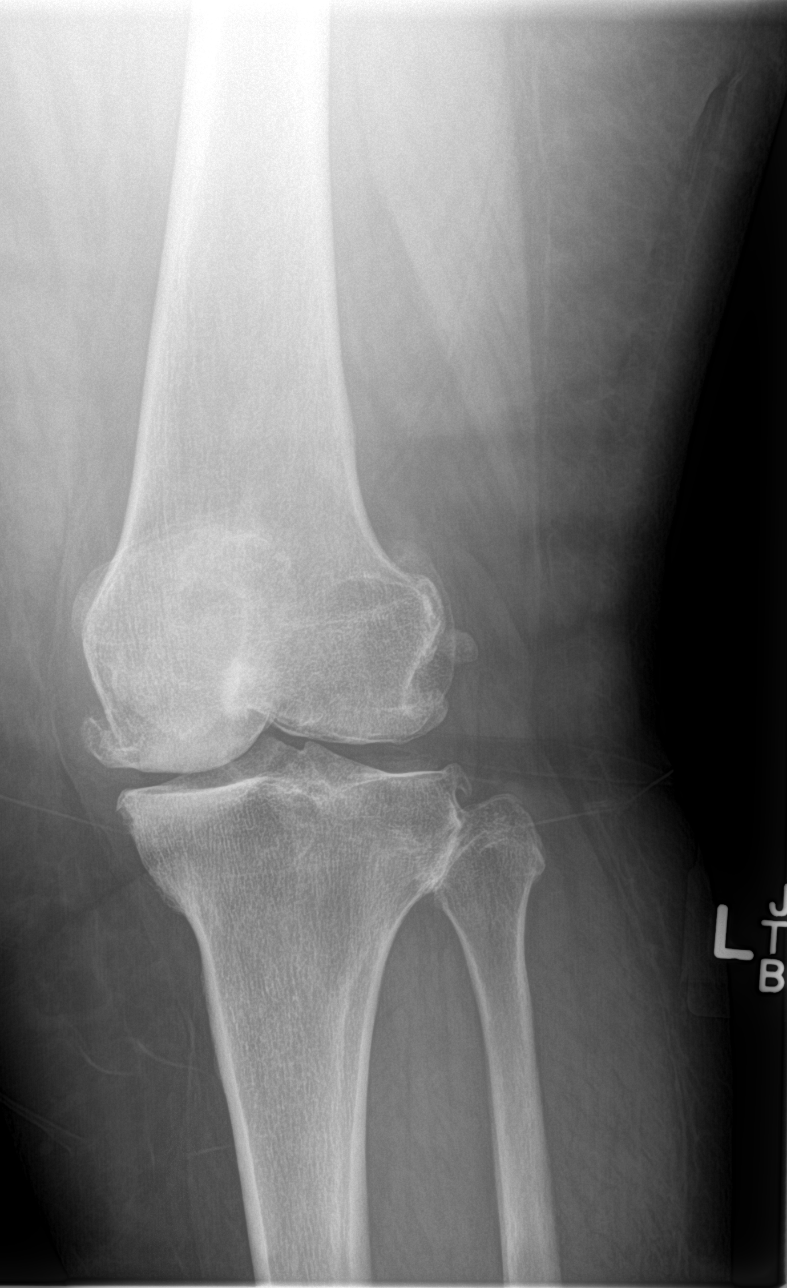

[knee lat]
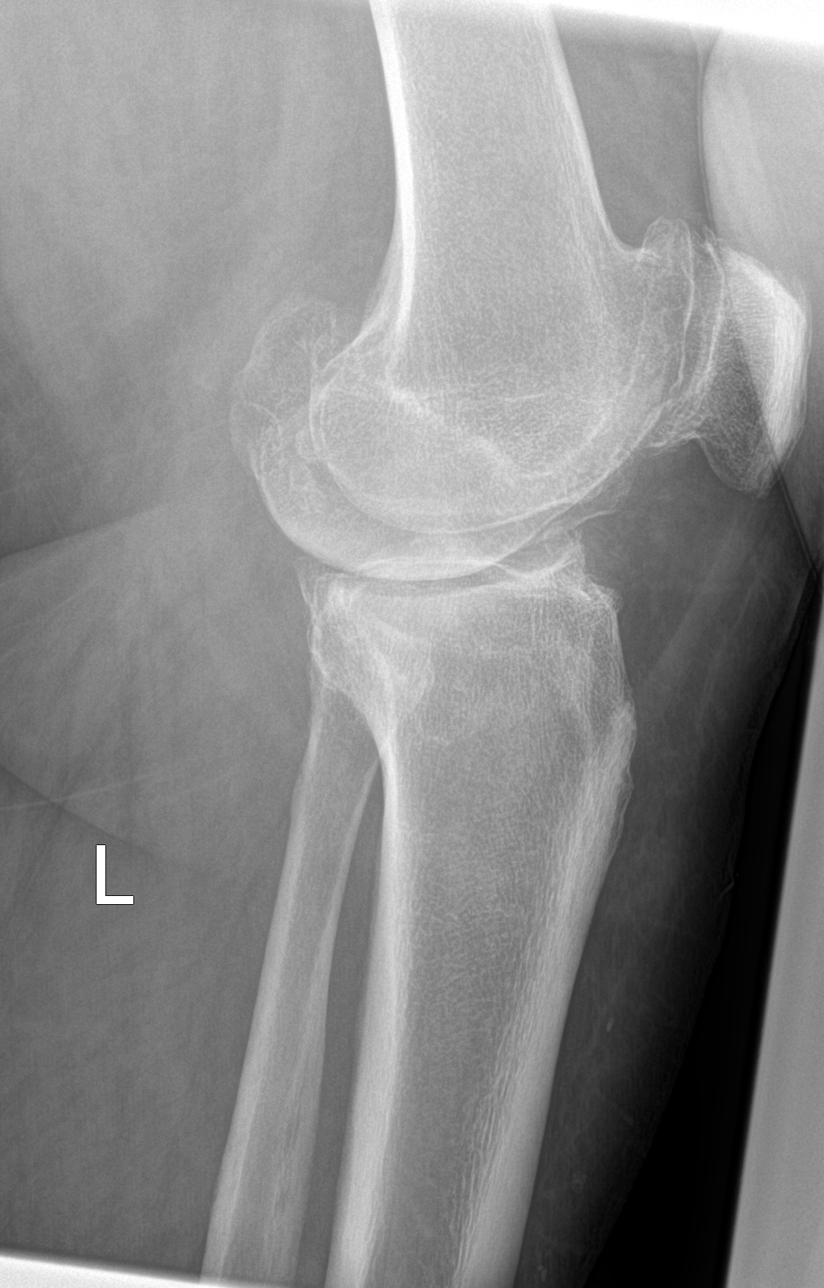

[4 of 4 positions shown; findings below may reference images not displayed]

FINDINGS: The mineralization appears adequate. There is no evidence of acute
fracture, dislocation or significant knee joint effusion. Advanced
tricompartmental degenerative changes are present, greatest in the
medial compartment where there is vacuum phenomenon. No definite
intra-articular loose bodies observed.
IMPRESSION: Advanced tricompartmental osteoarthritis. No acute osseous findings.

## 2016-03-07 IMAGING — CR DG TIBIA/FIBULA 2V*L*
2 series · 2 of 2 positions shown · non-contrast
Comparison: None.

CLINICAL DATA: Left lower leg pain.

EXAM:
LEFT TIBIA AND FIBULA - 2 VIEW

[tibia ap]
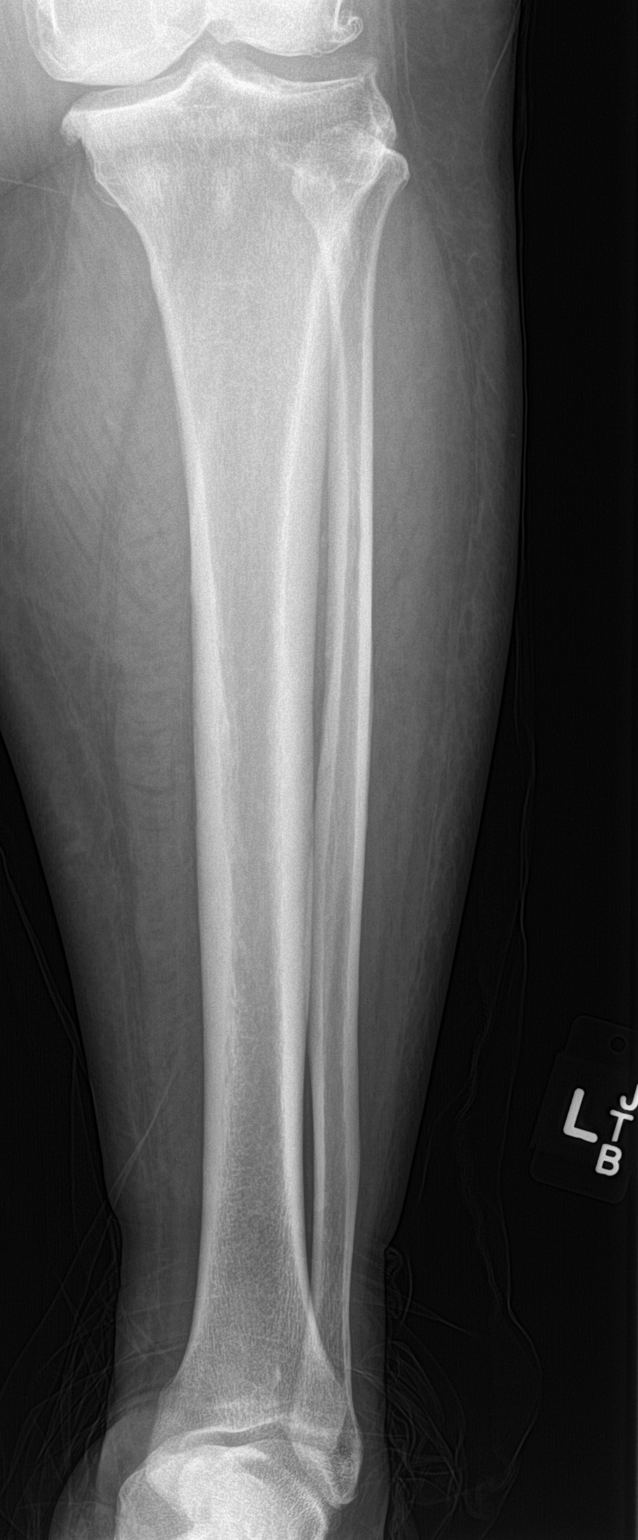

[tibia lat]
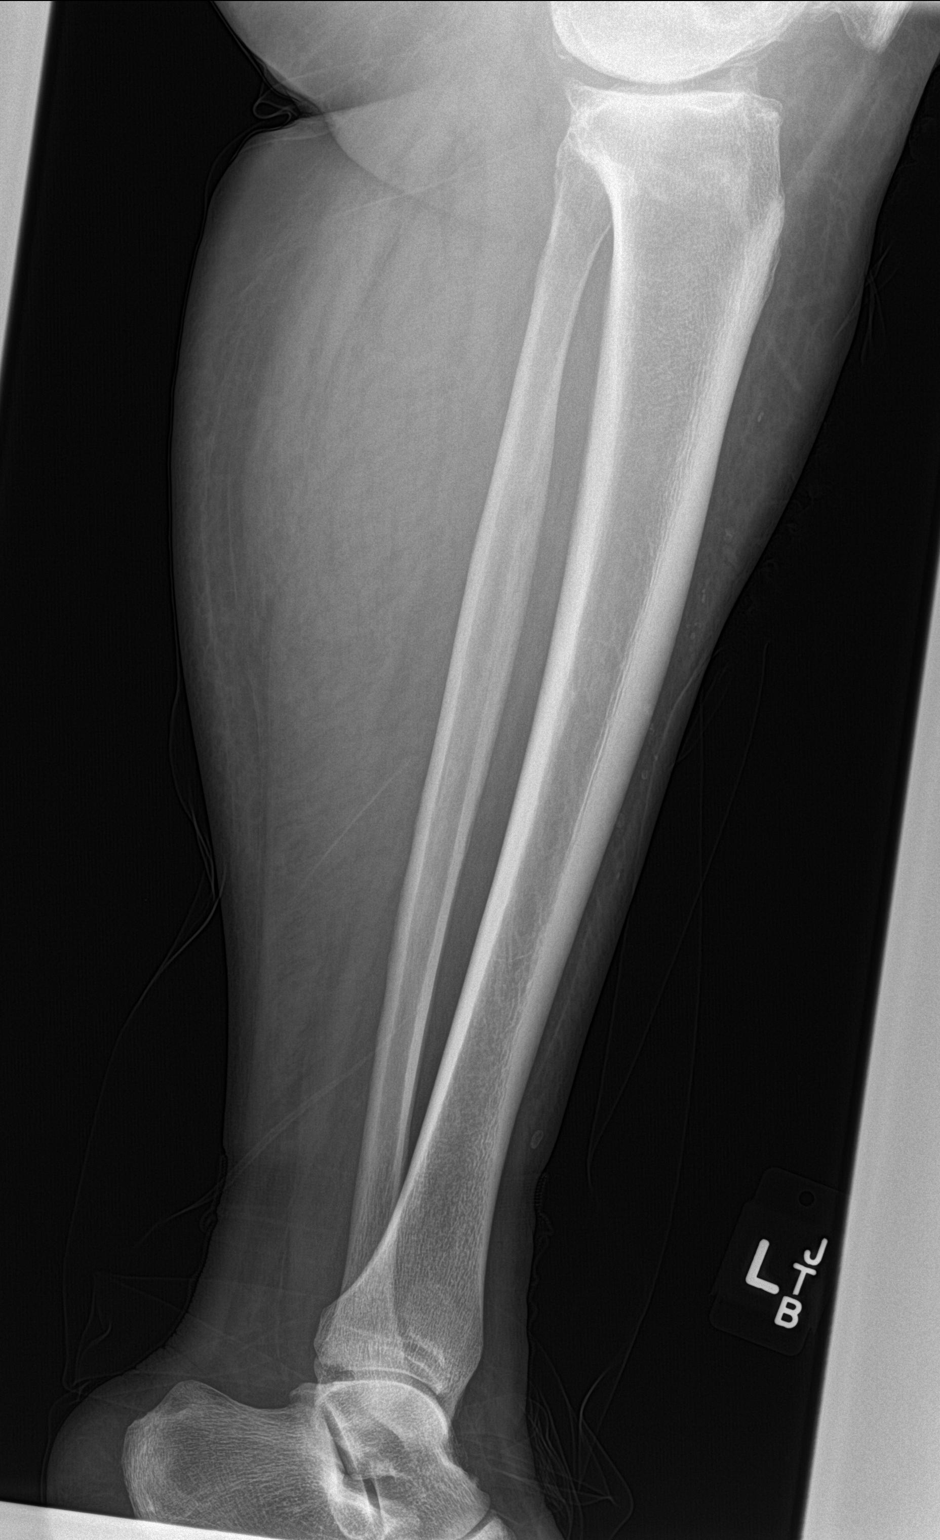

[2 of 2 positions shown; findings below may reference images not displayed]

FINDINGS: There is no evidence of fracture or other focal bone lesions. Soft
tissues are unremarkable.
IMPRESSION: Negative.

## 2016-03-31 ENCOUNTER — Encounter: Payer: Self-pay | Admitting: *Deleted

## 2016-04-10 ENCOUNTER — Encounter: Payer: Self-pay | Admitting: Internal Medicine

## 2016-05-09 ENCOUNTER — Telehealth: Payer: Self-pay | Admitting: Emergency Medicine

## 2016-05-09 ENCOUNTER — Encounter (INDEPENDENT_AMBULATORY_CARE_PROVIDER_SITE_OTHER): Payer: Self-pay

## 2016-05-09 ENCOUNTER — Ambulatory Visit (INDEPENDENT_AMBULATORY_CARE_PROVIDER_SITE_OTHER): Payer: Medicare HMO | Admitting: Physician Assistant

## 2016-05-09 ENCOUNTER — Encounter: Payer: Self-pay | Admitting: Physician Assistant

## 2016-05-09 VITALS — BP 128/78 | HR 80 | Ht 62.5 in | Wt 218.6 lb

## 2016-05-09 DIAGNOSIS — Z800 Family history of malignant neoplasm of digestive organs: Secondary | ICD-10-CM | POA: Diagnosis not present

## 2016-05-09 DIAGNOSIS — R1030 Lower abdominal pain, unspecified: Secondary | ICD-10-CM

## 2016-05-09 DIAGNOSIS — R634 Abnormal weight loss: Secondary | ICD-10-CM

## 2016-05-09 DIAGNOSIS — Z85038 Personal history of other malignant neoplasm of large intestine: Secondary | ICD-10-CM | POA: Diagnosis not present

## 2016-05-09 DIAGNOSIS — K219 Gastro-esophageal reflux disease without esophagitis: Secondary | ICD-10-CM | POA: Diagnosis not present

## 2016-05-09 MED ORDER — NA SULFATE-K SULFATE-MG SULF 17.5-3.13-1.6 GM/177ML PO SOLN: 1.0000 | ORAL | 0 refills | Status: DC

## 2016-05-09 NOTE — Patient Instructions (Addendum)
You have been scheduled for an endoscopy and colonoscopy. Please follow the written instructions given to you at your visit today. Please pick up your prep supplies at the pharmacy within the next 1-3 days. If you use inhalers (even only as needed), please bring them with you on the day of your procedure.   Continue Omeprazole 40 mg daily.

## 2016-05-09 NOTE — Progress Notes (Addendum)
Chief Complaint: Discuss Flex Sig, personal h/o Colon cancer  HPI:  Brooke [name] is a 60 year old  African American female with a past medical history of anxiety, aortic stenosis, bilateral pulmonary embolism, chronic pain, colon cancer status post subtotal colectomy and chemotherapy, COPD, DVT, GERD, hypertension, morbid obesity who presents to clinic today for complaint of weight loss and discussion of screening flex sigmoidoscopy.   According to our records patient's last flexible sigmoidoscopy was performed by Dr. Sharlett Iles on 06/25/11 due to a history of colon cancer and previous subtotal colectomy in 1993 for colon cancer. There were no significant findings. Anastomosis was noted in the sigmoid colon. Patient recently received a letter from our office telling her it is time to schedule a repeat flexible sigmoidoscopy for surveillance.   Today, the patient presents to clinic accompanied by her daughter, who does assist with her history. Her history is somewhat limited as she had a stroke in 2016 and this has affected her memory. The patient tells me that she has experienced weight loss over this past year which has been unexpected. According to review of her chart this has been around 40 pounds since January. She is worried because she has a family history of cancer. She tells me that she herself had colon cancer as well as her 3 brothers. She is due for a flexible sigmoidoscopy and would like to schedule today.   Patient also describes reflux symptoms which are uncontrolled on Omeprazole 40 mg daily, though currently the patient's daughter tells me that she is out of this medication. They're waiting on her mail order to come in. The patient tells me that even when she is on the medicine she feels like she still has breakthrough reflux symptoms. Again history is limited and hard to decipher.   Patient also describes a lower abdominal pain which is intermittent. Her daughter tells me this is only when  she eats "like a whole cake or something that is bad for her", but the patient argues with her and tells me this pain occurs intermittently irregardless of a bowel movement and irregardless of what she has eaten.   Patient's medical history significant for a stroke in December 2016, patient was placed on Xarelto at that time.   Patient denies a change in her bowel habits, fever, chills, blood in her stool, melena, fatigue, nausea, vomiting, or dysphagia.  Past Medical History:  Diagnosis Date  . Anxiety   . Aortic stenosis    a. mild by echo 05/2015.  . Arthritis   . Asthma   . Bilateral pulmonary embolism ([place]) 02/2014   a. PCCM recommended lifelong anticoagulation.  . Chronic back pain   . Chronic bronchitis   . Chronic pain   . Colon cancer ([place])    a. s/p surgery, chemotherapy.  Marland Kitchen COPD (chronic obstructive pulmonary disease) ([place])   . Depression   . DVT (deep venous thrombosis) ([place]) 02/2014  . Dyslipidemia   . GERD (gastroesophageal reflux disease)   . Hypertension   . Migraine headache   . Morbid obesity ([place])   . Obstructive sleep apnea    mild  . Osteoarthritis   . Ovarian cyst   . PVC's (premature ventricular contractions) 2013  . Stroke ([place])   . Tobacco abuse   . Uterine fibroid     Past Surgical History:  Procedure Laterality Date  . CARPAL TUNNEL RELEASE     rt  . CESAREAN SECTION    . COLONOSCOPY    .  KNEE ARTHROSCOPY     bil.  Marland Kitchen MOUTH SURGERY     teeth extraction  . SUBTOTAL COLECTOMY    . TUBAL LIGATION      Current Outpatient Prescriptions  Medication Sig Dispense Refill  . acetaminophen (TYLENOL) 500 MG tablet Take 500 mg by mouth every 6 (six) hours as needed for headache.    . albuterol (PROVENTIL HFA;VENTOLIN HFA) 108 (90 Base) MCG/ACT inhaler Inhale 2 puffs into the lungs every 6 (six) hours as needed for wheezing or shortness of breath (shortness of breath). 1 Inhaler 3  . atorvastatin (LIPITOR) 40 MG tablet TAKE 1 TABLET DAILY AT 6 PM 90  tablet 0  . azithromycin (ZITHROMAX) 250 MG tablet TAKE 1 TAB DAILY FOR 4 DAYS 4 each 0  . citalopram (CELEXA) 20 MG tablet Take 1 tablet (20 mg total) by mouth daily. 30 tablet 3  . gabapentin (NEURONTIN) 300 MG capsule TAKE 2 CAPSULES TWICE DAILY 360 capsule 0  . HYDROcodone-acetaminophen (NORCO/VICODIN) 5-325 MG tablet Take 1 tablet by mouth every 4 (four) hours as needed. 6 tablet 0  . hydrOXYzine (ATARAX/VISTARIL) 10 MG tablet Take 1 tablet (10 mg total) by mouth 3 (three) times daily as needed. (Patient not taking: Reported on 11/22/2015) 30 tablet 0  . levETIRAcetam (KEPPRA) 500 MG tablet TAKE 1 TABLET TWICE DAILY 180 tablet 0  . lisinopril (PRINIVIL,ZESTRIL) 5 MG tablet Take 1 tablet (5 mg total) by mouth daily. 30 tablet 3  . loratadine (CLARITIN) 10 MG tablet Take 1 tablet (10 mg total) by mouth daily. 30 tablet 0  . LORazepam (ATIVAN) 1 MG tablet Take 1 tablet by mouth daily as needed for anxiety.     Marland Kitchen omeprazole (PRILOSEC) 40 MG capsule TAKE 1 CAPSULE EVERY DAY 90 capsule 0  . oxyCODONE-acetaminophen (PERCOCET/ROXICET) 5-325 MG tablet Take 1 tablet by mouth every 6 (six) hours as needed for moderate pain.     . potassium chloride SA (K-DUR,KLOR-CON) 20 MEQ tablet Take 20 mEq by mouth daily.    . rivaroxaban (XARELTO) 20 MG TABS tablet TAKE ONE TABLET BY MOUTH ONCE DAILY IN THE EVENING WITH SUPPER 30 tablet 3   No current facility-administered medications for this visit.     Allergies as of 05/09/2016 - Review Complete 05/09/2016  Allergen Reaction Noted  . Kiwi extract Anaphylaxis, Hives, and Swelling 06/19/2011  . Aspirin Nausea Only 07/28/2011    Family History  Problem Relation Age of Onset  . Uterine cancer Mother   . Ovarian cancer Mother   . Stroke Father   . Diabetes Father   . Hypertension Father   . Colon cancer Maternal Uncle   . Breast cancer Maternal Grandmother   . Diabetes Sister   . Asthma Child   . Asthma Child     Social History   Social History    . Marital status: Widowed    Spouse name: N/A  . Number of children: 2  . Years of education: N/A   Occupational History  . disabled Unemployed   Social History Main Topics  . Smoking status: Current Some Day Smoker    Packs/day: 1.00    Years: 37.00    Types: Cigarettes  . Smokeless tobacco: Never Used     Comment: trying to quit, down to .5ppd X2 years ago.   . Alcohol use No  . Drug use: No  . Sexual activity: Yes   Other Topics Concern  . Not on file   Social History Narrative  Lives alone.  Widowed but has a female companion who helps out.  Ambulates with a cane.  Drinks coffee daily.             Review of Systems:     Constitutional: Positive for weight loss No fever or chills Cardiovascular: No chest pain  Respiratory: No SOB  Gastrointestinal: See HPI and otherwise negative   Physical Exam:  Vital signs: BP 128/78 (BP Location: Left Arm, Patient Position: Sitting, Cuff Size: Normal)   Pulse 80   Ht 5' 2.5" (1.588 m)   Wt 218 lb 9.6 oz (99.2 kg)   LMP 05/24/2003   BMI 39.35 kg/m    Constitutional:   African-American female appears to be in NAD, Well developed, Well nourished, alert and cooperative Respiratory: Respirations even and unlabored. Lungs clear to auscultation bilaterally.   No wheezes, crackles, or rhonchi.  Cardiovascular: Normal S1, S2. No MRG. Regular rate and rhythm. No peripheral edema, cyanosis or pallor.  Gastrointestinal:  Soft, nondistended, nontender. No rebound or guarding. Normal bowel sounds. No appreciable masses or hepatomegaly. Psychiatric: Memory impairment, disjointed speech  MOST RECENT LABS: CBC    Component Value Date/Time   WBC 7.5 11/22/2015 2216   RBC 4.62 11/22/2015 2216   HGB 15.1 (H) 11/22/2015 2216   HCT 44.5 11/22/2015 2216   PLT 181 11/22/2015 2216   MCV 96.3 11/22/2015 2216   MCH 32.7 11/22/2015 2216   MCHC 33.9 11/22/2015 2216   RDW 13.8 11/22/2015 2216   LYMPHSABS 3.0 11/22/2015 2216   MONOABS 0.7  11/22/2015 2216   EOSABS 0.1 11/22/2015 2216   BASOSABS 0.0 11/22/2015 2216    CMP     Component Value Date/Time   NA 138 11/22/2015 2216   K 3.2 (L) 11/22/2015 2216   CL 104 11/22/2015 2216   CO2 24 11/22/2015 2216   GLUCOSE 102 (H) 11/22/2015 2216   BUN 7 11/22/2015 2216   CREATININE 0.70 11/22/2015 2216   CREATININE 0.72 06/13/2014 1254   CALCIUM 8.9 11/22/2015 2216   PROT 7.8 11/22/2015 2216   ALBUMIN 4.1 11/22/2015 2216   AST 15 11/22/2015 2216   ALT 10 (L) 11/22/2015 2216   ALKPHOS 71 11/22/2015 2216   BILITOT 0.8 11/22/2015 2216   GFRNONAA >60 11/22/2015 2216   GFRNONAA >89 06/13/2014 1254   GFRAA >60 11/22/2015 2216   GFRAA >89 06/13/2014 1254    Assessment: 1. Personal history of colon cancer: Patient is status post subtotal colectomy in 1993 for colon cancer, last screening was in 2013, repeat was recommended in January 2018. 2. Weight loss: Patient is experiencing unexplained weight loss of around 40 pounds this year per chart review 3. Family history of colon cancer: Her 3 brothers and herself 4. GERD: Patient has intermittent reflux symptoms, she normally takes Omeprazole 40 mg daily but is out of this medication currently 5. Abdominal pain: Intermittent lower abdominal cramping, unclear if this is just before bowel movement or occurs throughout the day as her history is limited; consider IBS versus other  Plan: 1. Recommend the patient proceed with an EGD and flex sigmoidoscopy. Discussed risks, benefits, limitations and alternatives procedure the patient and she agrees to proceed. These will be scheduled with Dr. Hilarie Fredrickson in [facility]. 2. Patient was advised to hold her Xarelto for 24 hours prior to her procedure. We will communicate with her cardiologist to insure that holding her Xarelto is acceptable. 3. Patient was urged to continue her Omeprazole 40 mg daily, 30-60 minutes before eating  breakfast. 4. Reviewed antireflux diet and last all modifications. 5.  Patient to follow in clinic per Dr. Vena Rua recommendations after time of procedure.  Ellouise Newer, PA-C [facility] 05/09/2016, 10:00 AM  Cc: Arnoldo Morale, MD   Addendum: Reviewed and agree with initial management. Jerene Bears, MD

## 2016-05-09 NOTE — Telephone Encounter (Signed)
Spoke to patients daughter Brooke [name], she reports they just left Dr. Lindwood Qua office and he told them it was ok to stop Xarelto x 48 hrs before procedure. He also send correspondence via fax with the same info. Patients daughter verbalized understanding.

## 2016-05-09 NOTE — Telephone Encounter (Signed)
  05/09/2016   RE: Brooke [name] DOB: March 17, 1956 MRN: [id number]   Dear Dr. Holley Raring,    We have scheduled the above patient for an endoscopic procedure. Our records show that she is on anticoagulation therapy.   Please advise as to how long the patient may come off her therapy of Xarelto prior to the procedure, which is scheduled for 06/24/16.  Please fax back/ or route the completed form to Tinnie Gens, [place].   Sincerely,    Tinnie Gens, [place]

## 2016-05-12 ENCOUNTER — Ambulatory Visit: Payer: Medicare HMO | Admitting: Physician Assistant

## 2016-05-19 ENCOUNTER — Encounter (HOSPITAL_COMMUNITY): Payer: Self-pay | Admitting: Emergency Medicine

## 2016-05-19 ENCOUNTER — Emergency Department (HOSPITAL_COMMUNITY)
Admission: EM | Admit: 2016-05-19 | Discharge: 2016-05-19 | Disposition: A | Discharge status: Home / Self Care | Payer: Medicare HMO | Attending: Emergency Medicine | Admitting: Emergency Medicine

## 2016-05-19 DIAGNOSIS — Z8673 Personal history of transient ischemic attack (TIA), and cerebral infarction without residual deficits: Secondary | ICD-10-CM | POA: Diagnosis not present

## 2016-05-19 DIAGNOSIS — F17210 Nicotine dependence, cigarettes, uncomplicated: Secondary | ICD-10-CM | POA: Insufficient documentation

## 2016-05-19 DIAGNOSIS — Z7901 Long term (current) use of anticoagulants: Secondary | ICD-10-CM | POA: Diagnosis not present

## 2016-05-19 DIAGNOSIS — Z85038 Personal history of other malignant neoplasm of large intestine: Secondary | ICD-10-CM | POA: Insufficient documentation

## 2016-05-19 DIAGNOSIS — R040 Epistaxis: Secondary | ICD-10-CM | POA: Diagnosis not present

## 2016-05-19 DIAGNOSIS — I10 Essential (primary) hypertension: Secondary | ICD-10-CM | POA: Insufficient documentation

## 2016-05-19 DIAGNOSIS — J449 Chronic obstructive pulmonary disease, unspecified: Secondary | ICD-10-CM | POA: Insufficient documentation

## 2016-05-19 MED ORDER — OXYCODONE-ACETAMINOPHEN 5-325 MG PO TABS
1.0000 | ORAL_TABLET | Freq: Once | ORAL | Status: AC
  Administered 2016-05-19: 1 via ORAL
  Filled 2016-05-19: qty 1

## 2016-05-19 NOTE — ED Provider Notes (Signed)
[facility] DEPT Provider Note   CSN: [id number] Arrival date & time: 05/19/16  0854     History   Chief Complaint Chief Complaint  Patient presents with  . Epistaxis    HPI Brooke [name] is a 60 y.o. female.  60 year old female with history of recurrent pulmonary embolism on chronic therapy with Xarelto presents with intermittent blood-tinged mucus after she blows her nose 2 days. Endorse a recent URI consisting of nasal congestion without cough or fever. Denies any profound nasal bleeding. Denies any bruising to her skin. Denies any hemoptysis at this time. Symptoms resolve spontaneously. She has not had any active bleeding now for over 12 hours. Denies any prior history of nosebleeds      Past Medical History:  Diagnosis Date  . Anxiety   . Aortic stenosis    a. mild by echo 05/2015.  . Arthritis   . Asthma   . Bilateral pulmonary embolism ([place]) 02/2014   a. PCCM recommended lifelong anticoagulation.  . Chronic back pain   . Chronic bronchitis   . Chronic pain   . Colon cancer ([place])    a. s/p surgery, chemotherapy.  Marland Kitchen COPD (chronic obstructive pulmonary disease) ([place])   . Depression   . DVT (deep venous thrombosis) ([place]) 02/2014  . Dyslipidemia   . GERD (gastroesophageal reflux disease)   . Hypertension   . Migraine headache   . Morbid obesity ([place])   . Obstructive sleep apnea    mild  . Osteoarthritis   . Ovarian cyst   . PVC's (premature ventricular contractions) 2013  . Stroke ([place])   . Tobacco abuse   . Uterine fibroid     Patient Active Problem List   Diagnosis Date Noted  . Neuropathy ([place]) 10/29/2015  . Seizures ([place]) 06/20/2015  . Expressive aphasia   . 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 Stroke with cerebral ischemia ([place])   . Chronic obstructive pulmonary disease ([place])   . Hx of migraines   . OSA (obstructive sleep apnea)   . Hemiparesis, aphasia, and dysphagia as late effect of cerebrovascular  accident (CVA) ([place])   . Elevated troponin 05/28/2015  . Essential hypertension 05/28/2015  . Hyperkalemia 05/28/2015  . History of pulmonary embolism 05/28/2015  . History of DVT (deep vein thrombosis) 05/28/2015  . Global aphasia   . Aphasia   . Weakness   . Stroke ([place]) 05/26/2015  . Acute tonsillitis 06/25/2014  . Respiratory failure ([place]) 02/23/2014  . Asthma, chronic 02/08/2014  . Obstructive sleep apnea 02/08/2014  . GERD (gastroesophageal reflux disease) 09/28/2013  . Chest pain 04/19/2013  . Obesity, Class III, BMI 40-49.9 (morbid obesity) ([place]) 08/23/2012  . Constipation 08/20/2012  . Headache(784.0) 08/20/2012  . Chronic pain syndrome 08/19/2012  . Hyperglycemia 08/19/2012  . Insomnia 08/19/2012  . Hypokalemia 10/21/2011  . Tobacco abuse 10/21/2011  . Arthritis 10/21/2011  . Anxiety 10/21/2011  . Depression 10/21/2011  . Esophageal reflux 07/30/2011  . Diarrhea following gastrointestinal surgery 07/30/2011  . PVC (premature ventricular contraction) 07/10/2011  . Morbid obesity ([place])   . Hypertension   . Dyslipidemia   . 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 CARPAL TUNNEL RELEASE     rt  . CESAREAN SECTION    . COLONOSCOPY    . KNEE ARTHROSCOPY     bil.  Marland Kitchen MOUTH SURGERY  teeth extraction  . SUBTOTAL COLECTOMY    . TUBAL LIGATION      OB History    Gravida Para Term Preterm AB Living   _0 SAB TAB Ectopic Multiple Live Births                   Home Medications    Prior to Admission medications   Medication Sig Start Date End Date Taking? Authorizing Provider  acetaminophen (TYLENOL) 500 MG tablet Take 500 mg by mouth every 6 (six) hours as needed for headache.    Historical Provider, MD  albuterol (PROVENTIL HFA;VENTOLIN HFA) 108 (90 Base) MCG/ACT inhaler Inhale 2 puffs into the lungs every 6 (six) hours as needed for  wheezing or shortness of breath (shortness of breath). 10/29/15   Arnoldo Morale, MD  atorvastatin (LIPITOR) 40 MG tablet TAKE 1 TABLET DAILY AT 6 PM 01/22/16   Arnoldo Morale, MD  azithromycin (ZITHROMAX) 250 MG tablet TAKE 1 TAB DAILY FOR 4 DAYS Patient not taking: Reported on 05/09/2016 11/23/15   Isla Pence, MD  citalopram (CELEXA) 20 MG tablet Take 1 tablet (20 mg total) by mouth daily. 10/29/15   Arnoldo Morale, MD  gabapentin (NEURONTIN) 300 MG capsule TAKE 2 CAPSULES TWICE DAILY 02/26/16   Arnoldo Morale, MD  hydrochlorothiazide (HYDRODIURIL) 25 MG tablet Take 25 mg by mouth daily.    Historical Provider, MD  HYDROcodone-acetaminophen (NORCO/VICODIN) 5-325 MG tablet Take 1 tablet by mouth every 4 (four) hours as needed. Patient not taking: Reported on 05/09/2016 11/23/15   Isla Pence, MD  hydrOXYzine (ATARAX/VISTARIL) 10 MG tablet Take 1 tablet (10 mg total) by mouth 3 (three) times daily as needed. Patient not taking: Reported on 05/09/2016 10/29/15   Arnoldo Morale, MD  levETIRAcetam (KEPPRA) 500 MG tablet TAKE 1 TABLET TWICE DAILY 01/22/16   Arnoldo Morale, MD  lisinopril (PRINIVIL,ZESTRIL) 5 MG tablet Take 1 tablet (5 mg total) by mouth daily. Patient not taking: Reported on 05/09/2016 10/29/15   Arnoldo Morale, MD  loratadine (CLARITIN) 10 MG tablet Take 1 tablet (10 mg total) by mouth daily. 11/23/15   Isla Pence, MD  LORazepam (ATIVAN) 1 MG tablet Take 1 tablet by mouth daily as needed for anxiety.  11/07/15   Historical Provider, MD  Na Sulfate-K Sulfate-Mg Sulf 17.5-3.13-1.6 GM/180ML SOLN Take 1 kit by mouth as directed. 05/09/16   Levin Erp, PA  omeprazole (PRILOSEC) 40 MG capsule TAKE 1 CAPSULE EVERY DAY 09/17/15   Arnoldo Morale, MD  oxyCODONE-acetaminophen (PERCOCET/ROXICET) 5-325 MG tablet Take 1 tablet by mouth every 6 (six) hours as needed for moderate pain.  11/07/15   Historical Provider, MD  potassium chloride SA (K-DUR,KLOR-CON) 20 MEQ tablet Take 20 mEq by mouth daily. 09/17/15    Historical Provider, MD  rivaroxaban (XARELTO) 20 MG TABS tablet TAKE ONE TABLET BY MOUTH ONCE DAILY IN THE EVENING WITH SUPPER 10/29/15   Arnoldo Morale, MD    Family History Family History  Problem Relation Age of Onset  . Uterine cancer Mother   . Ovarian cancer Mother   . Stroke Father   . Diabetes Father   . Hypertension Father   . Colon cancer Maternal Uncle   . Breast cancer Maternal Grandmother   . Diabetes Sister   . Asthma Child   . Asthma Child     Social History Social History  Substance Use Topics  . Smoking status: Current Some Day Smoker    Packs/day:  1.00    Years: 37.00    Types: Cigarettes  . Smokeless tobacco: Never Used     Comment: trying to quit, down to .5ppd X2 years ago.   . Alcohol use No     Allergies   Kiwi extract and Aspirin   Review of Systems Review of Systems  All other systems reviewed and are negative.    Physical Exam Updated Vital Signs BP (!) 158/101   Pulse 105   Temp 98.6 F (37 C)   Resp 16   LMP 05/24/2003   SpO2 100%   Physical Exam  Constitutional: She is oriented to person, place, and time. She appears well-developed and well-nourished.  Non-toxic appearance. No distress.  HENT:  Head: Normocephalic and atraumatic.  Nose: No epistaxis.  Eyes: Conjunctivae, EOM and lids are normal. Pupils are equal, round, and reactive to light.  Neck: Normal range of motion. Neck supple. No tracheal deviation present. No thyroid mass present.  Cardiovascular: Normal rate, regular rhythm and normal heart sounds.  Exam reveals no gallop.   No murmur heard. Pulmonary/Chest: Effort normal and breath sounds normal. No stridor. No respiratory distress. She has no decreased breath sounds. She has no wheezes. She has no rhonchi. She has no rales.  Abdominal: Soft. Normal appearance and bowel sounds are normal. She exhibits no distension. There is no tenderness. There is no rebound and no CVA tenderness.  Musculoskeletal: Normal range of  motion. She exhibits no edema or tenderness.  Neurological: She is alert and oriented to person, place, and time. She has normal strength. No cranial nerve deficit or sensory deficit. GCS eye subscore is 4. GCS verbal subscore is 5. GCS motor subscore is 6.  Skin: Skin is warm and dry. No abrasion and no rash noted.  Psychiatric: She has a normal mood and affect. Her speech is normal and behavior is normal.  Nursing note and vitals reviewed.    ED Treatments / Results  Labs (all labs ordered are listed, but only abnormal results are displayed) Labs Reviewed - No data to display  EKG  EKG Interpretation  Date/Time:  Monday May 19 2016 09:45:55 EST Ventricular Rate:  88 PR Interval:    QRS Duration: 76 QT Interval:  378 QTC Calculation: 458 R Axis:   47 Text Interpretation:  Sinus rhythm Consider right atrial enlargement No significant change since last tracing Confirmed by Caelynn Marshman  MD, Navneet Schmuck ([id number]) on 05/19/2016 9:58:45 AM       Radiology No results found.  Procedures Procedures (including critical care time)  Medications Ordered in ED Medications - No data to display   Initial Impression / Assessment and Plan / ED Course  I have reviewed the triage vital signs and the nursing notes.  Pertinent labs & imaging results that were available during my care of the patient were reviewed by me and considered in my medical decision making (see chart for details).  Clinical Course     Patient has been experiencing musculoskeletal chest wall pain worse with movement also. She is pinpoint tender left anterior chest. EKG is unchanged. Do not think that this is ACS. She doesn't have any active bleeding at this time. Be discharged home and return precautions given  Final Clinical Impressions(s) / ED Diagnoses   Final diagnoses:  None    New Prescriptions New Prescriptions   No medications on file     Lacretia Leigh, MD 05/19/16 1009

## 2016-05-19 NOTE — ED Notes (Signed)
Allen cleared EKG and verbalizes continue to administer medication as ordered.

## 2016-05-19 NOTE — ED Triage Notes (Signed)
Pt reports nose bleed x 2 days. Hx HTN. Reports nasal congestion and cough. No active nose bleeding in triage. denies fever. Alert and oriented x 4. denies headache. Take xerolto. Hx stroke 1 years ago.

## 2016-05-19 NOTE — ED Notes (Addendum)
With attempt to administer pain medication as ordered pt complaint of sudden onset left chest sharpness rated 5/10. Pt hx of hyperlipemia, hypertension, and smoking. Pt denies associated symptoms. EKG obtained.

## 2016-06-03 IMAGING — CR DG CHEST 2V
2 series · 2 of 2 positions shown · non-contrast
Comparison: 06/15/2014

CLINICAL DATA: Pt from home c/o bilateral leg swelling. Pt reports
HX of PE and DVT " a few months ago". H/o COPD, Asthma, chronic
bronchitis, colon cancer, controlled HTN. Smoker of 1 ppd x
approximately 37 years.

EXAM:
CHEST  2 VIEW

[w chest lat]
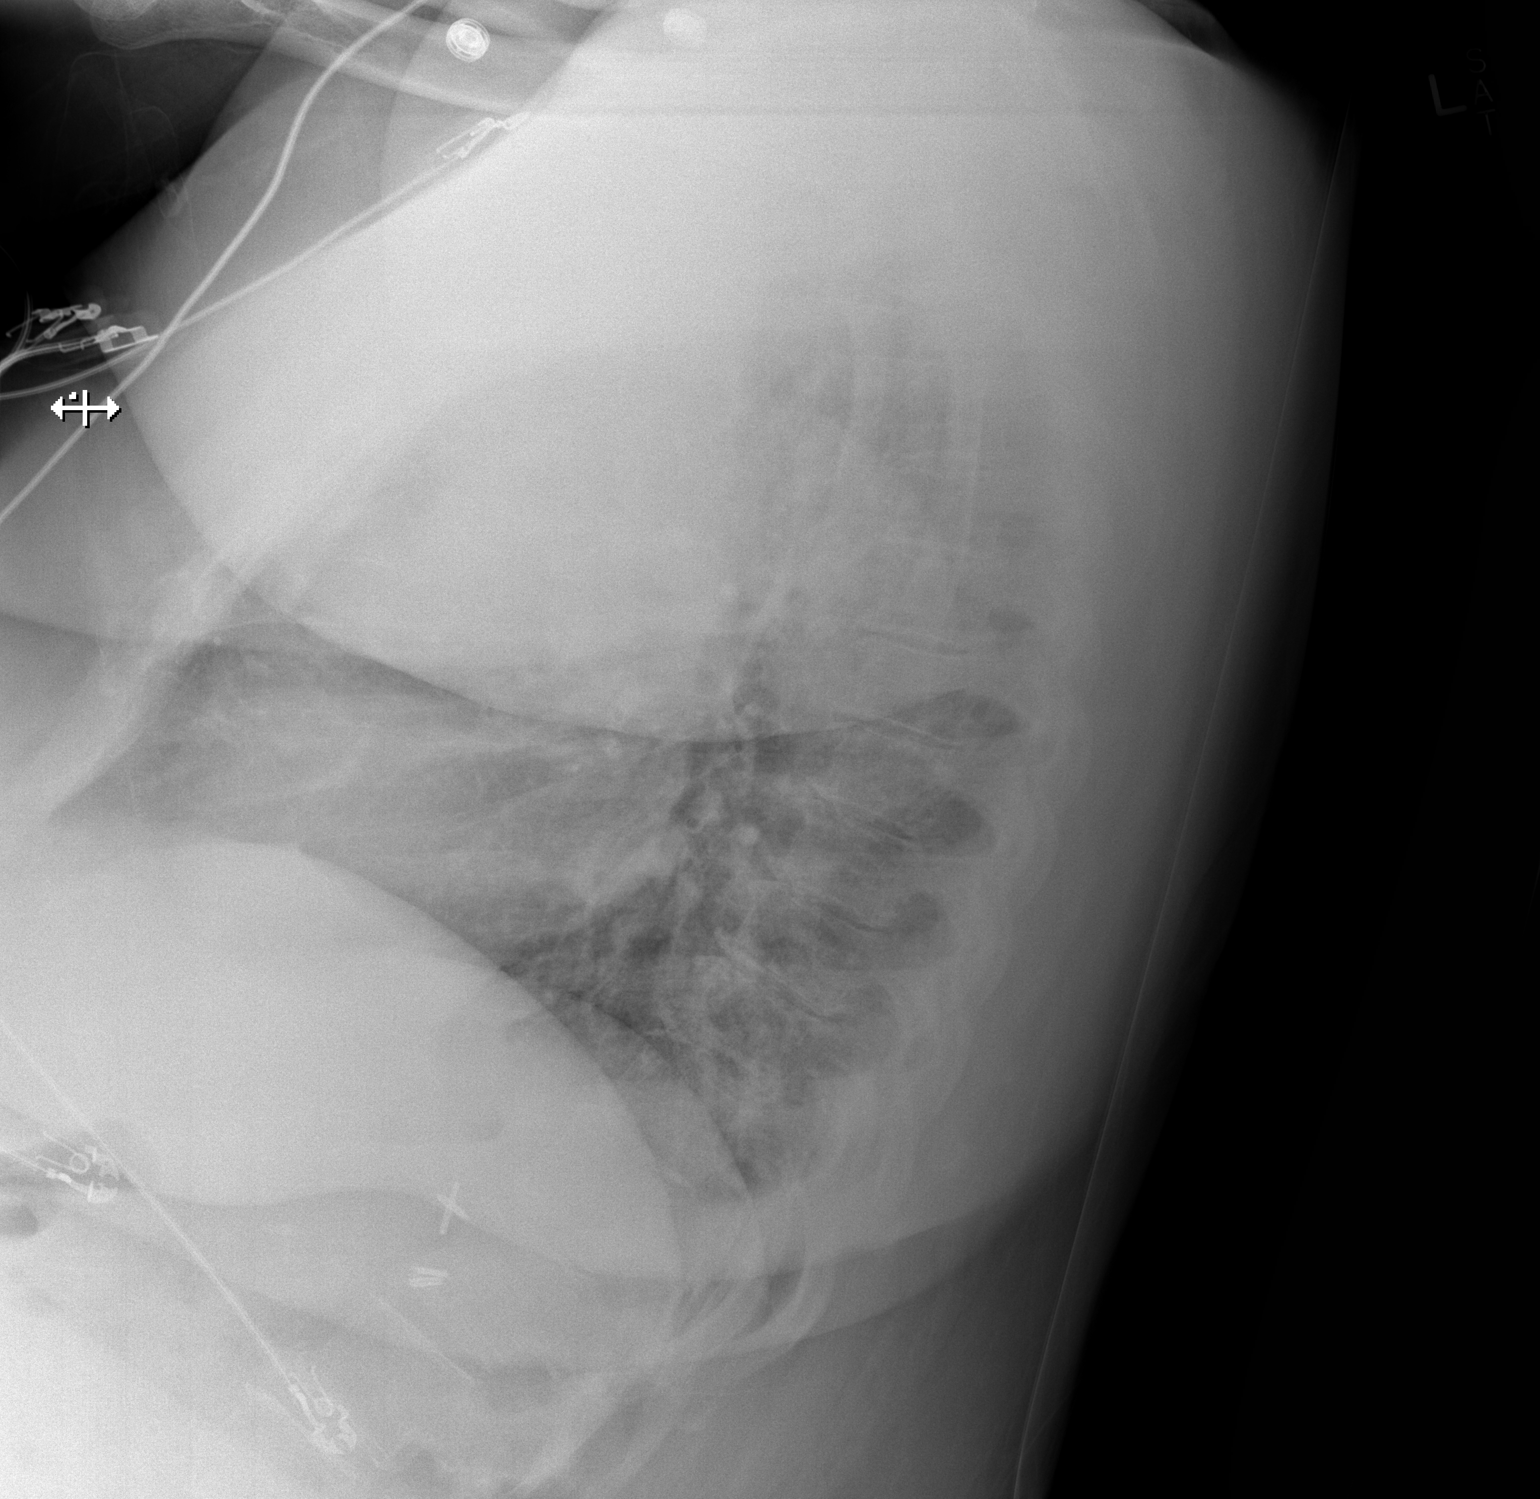

[x chest ap]
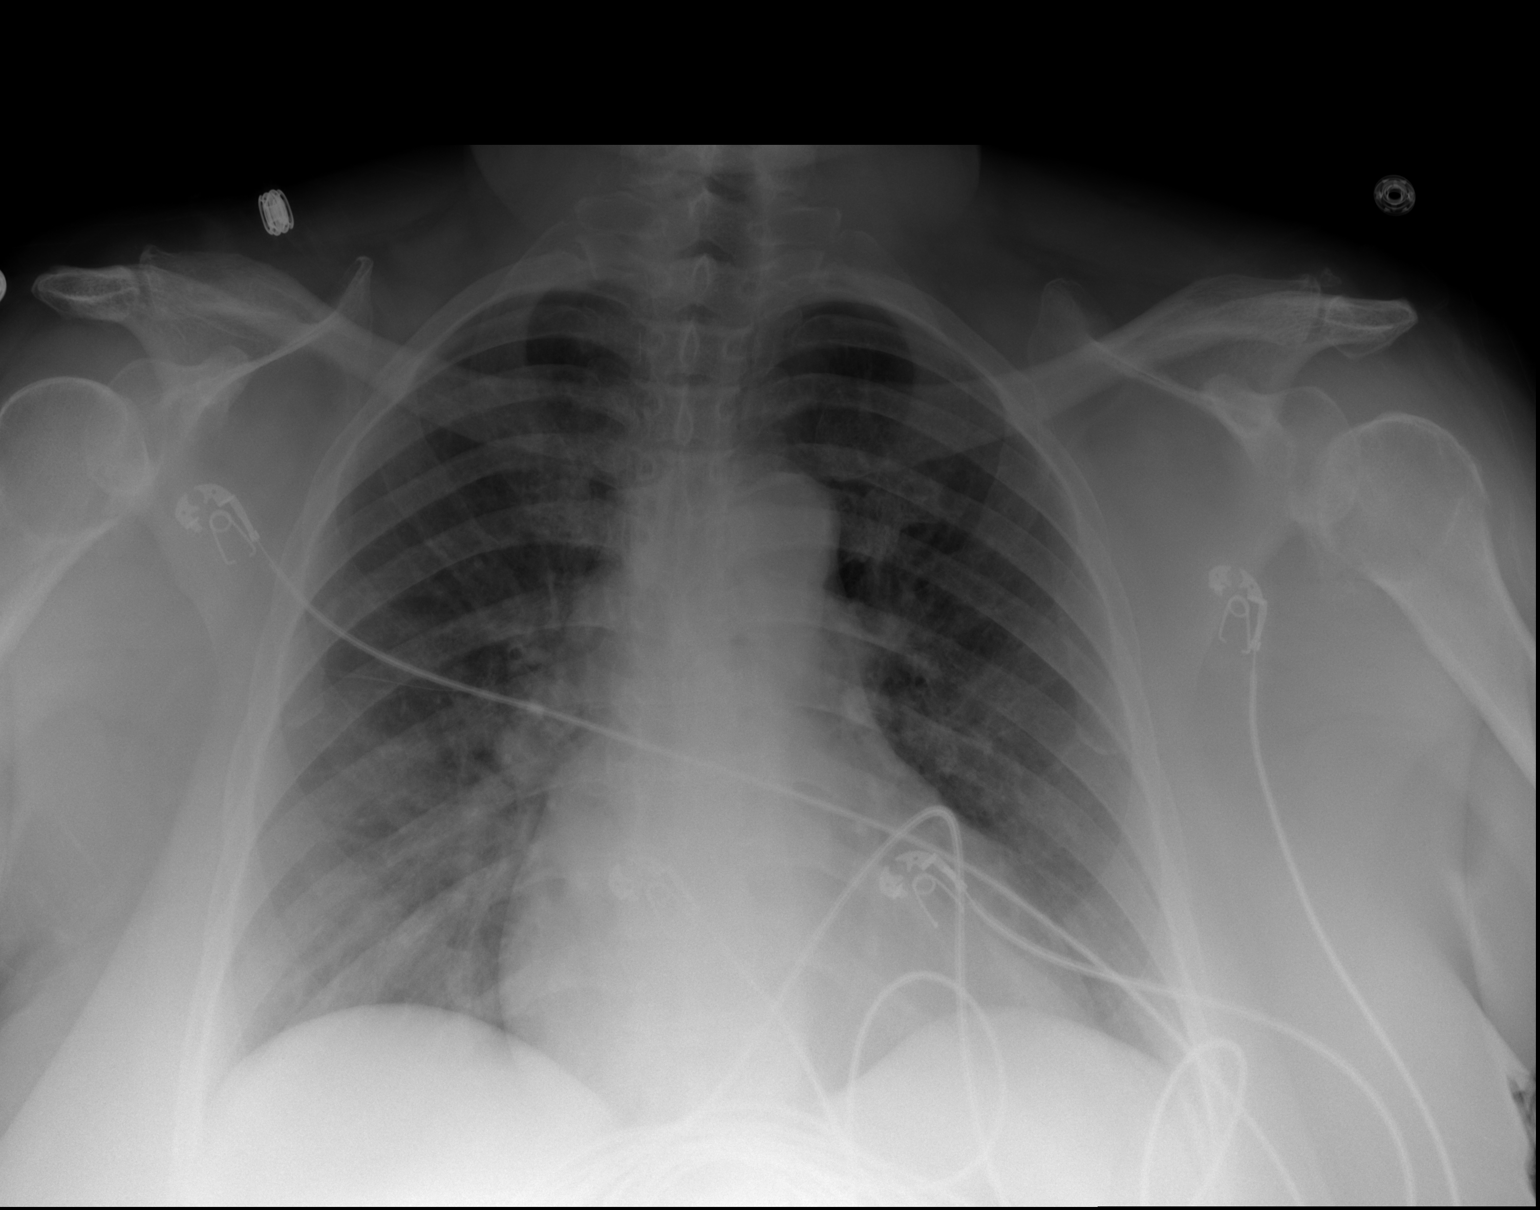

[2 of 2 positions shown; findings below may reference images not displayed]

FINDINGS: Cardiac silhouette is mildly enlarged. Normal mediastinal and hilar
contours.

Clear lungs.  No pleural effusion or pneumothorax.

Bony thorax is intact.
IMPRESSION: No acute cardiopulmonary disease.

## 2016-06-10 ENCOUNTER — Encounter: Payer: Self-pay | Admitting: Internal Medicine

## 2016-06-23 ENCOUNTER — Telehealth: Payer: Self-pay | Admitting: Internal Medicine

## 2016-06-23 IMAGING — CR DG LUMBAR SPINE COMPLETE 4+V
5 series · 5 of 5 positions shown · non-contrast
Comparison: April 20, 2013 lumbar radiographs; lumbar MRI
July 21, 2013

CLINICAL DATA: Lumbago following fall down steps

EXAM:
LUMBAR SPINE - COMPLETE 4+ VIEW

[t l-spine a.p.]
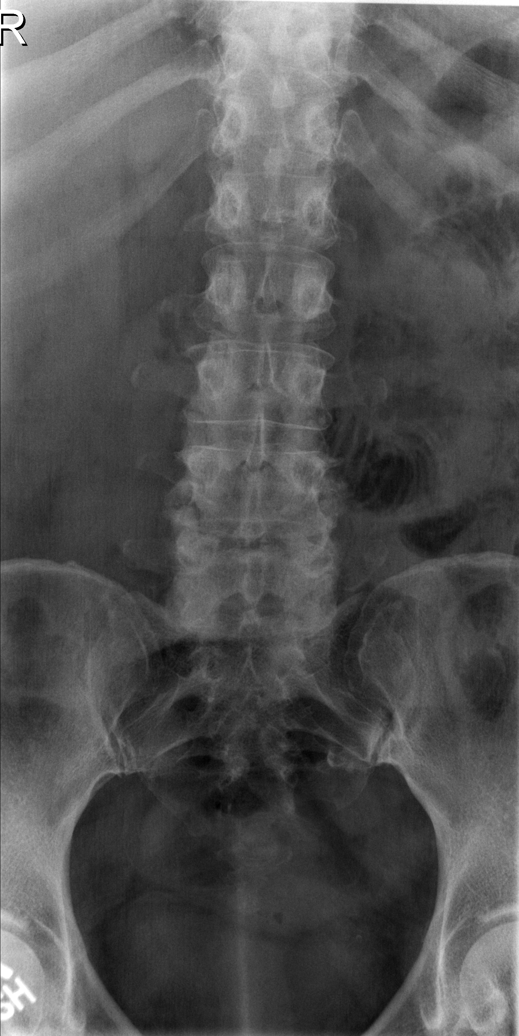

[t l-spine oblique exposure (1 of 2)]
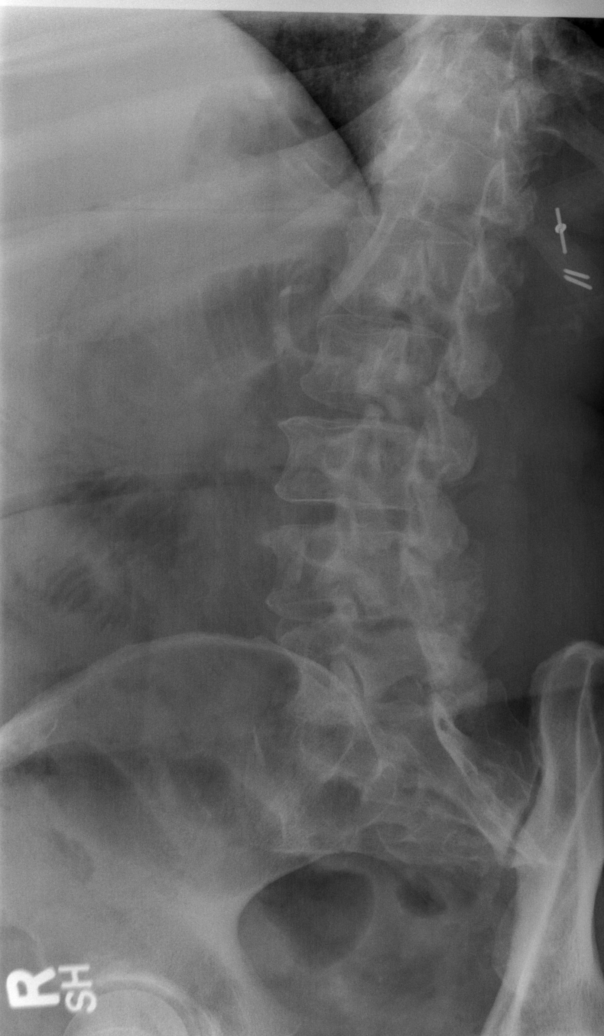

[t l-spine oblique exposure (2 of 2)]
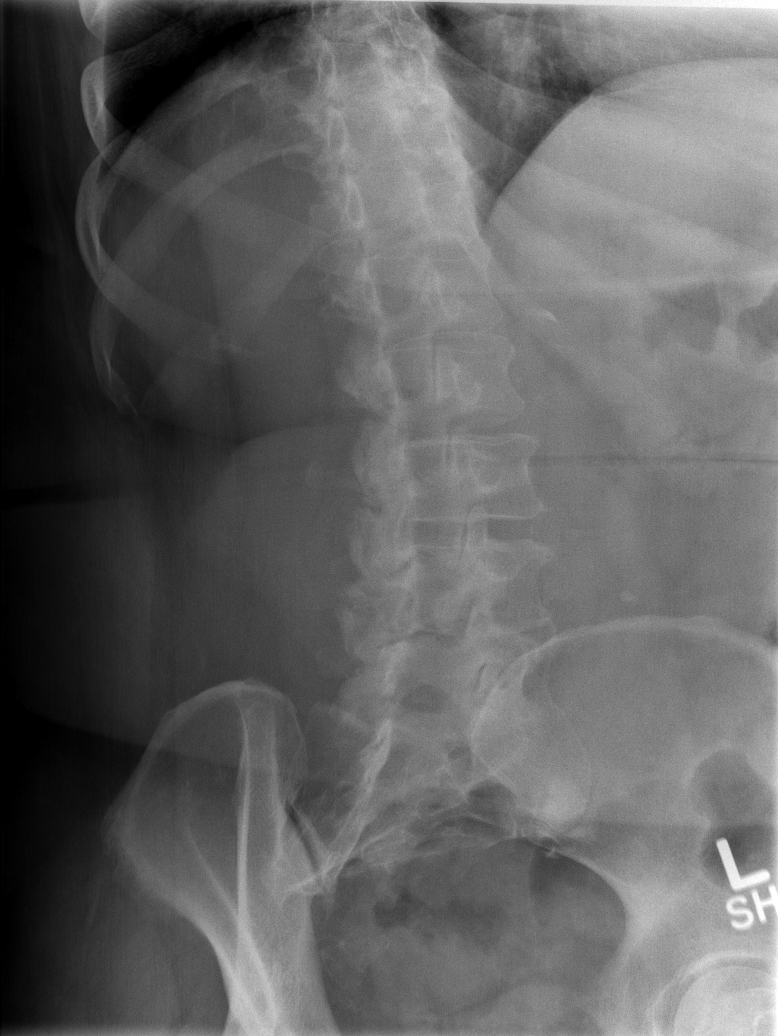

[t l-spine lat]
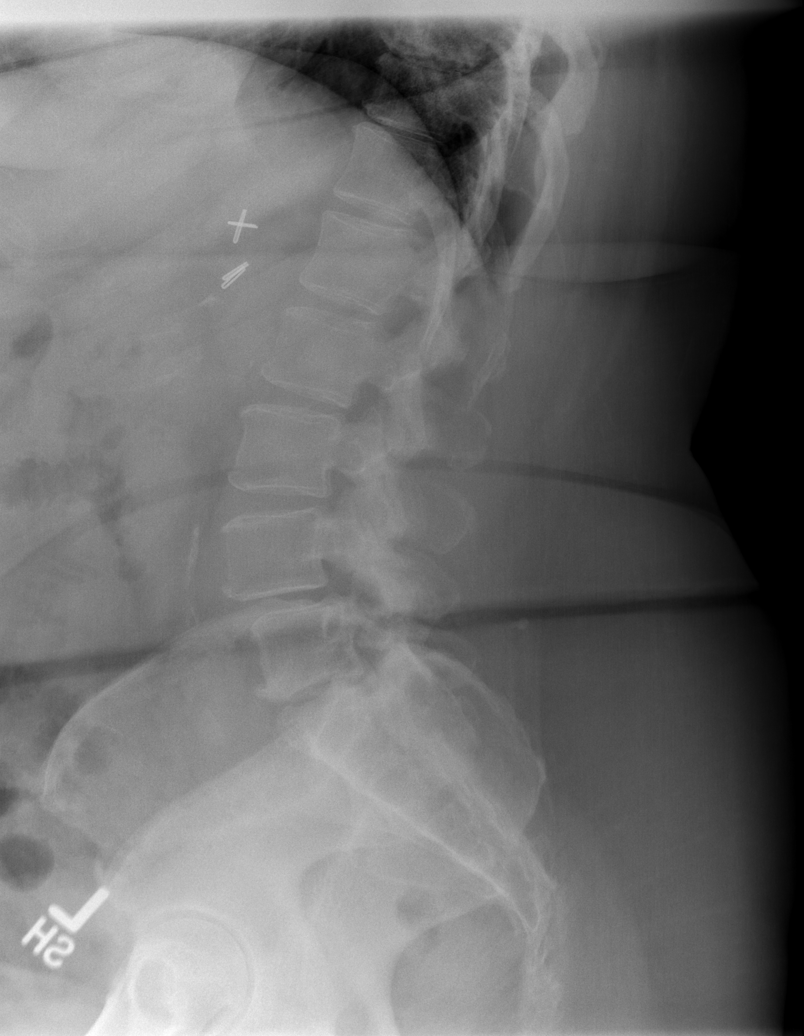

[t l-spine l5-s1 spot]
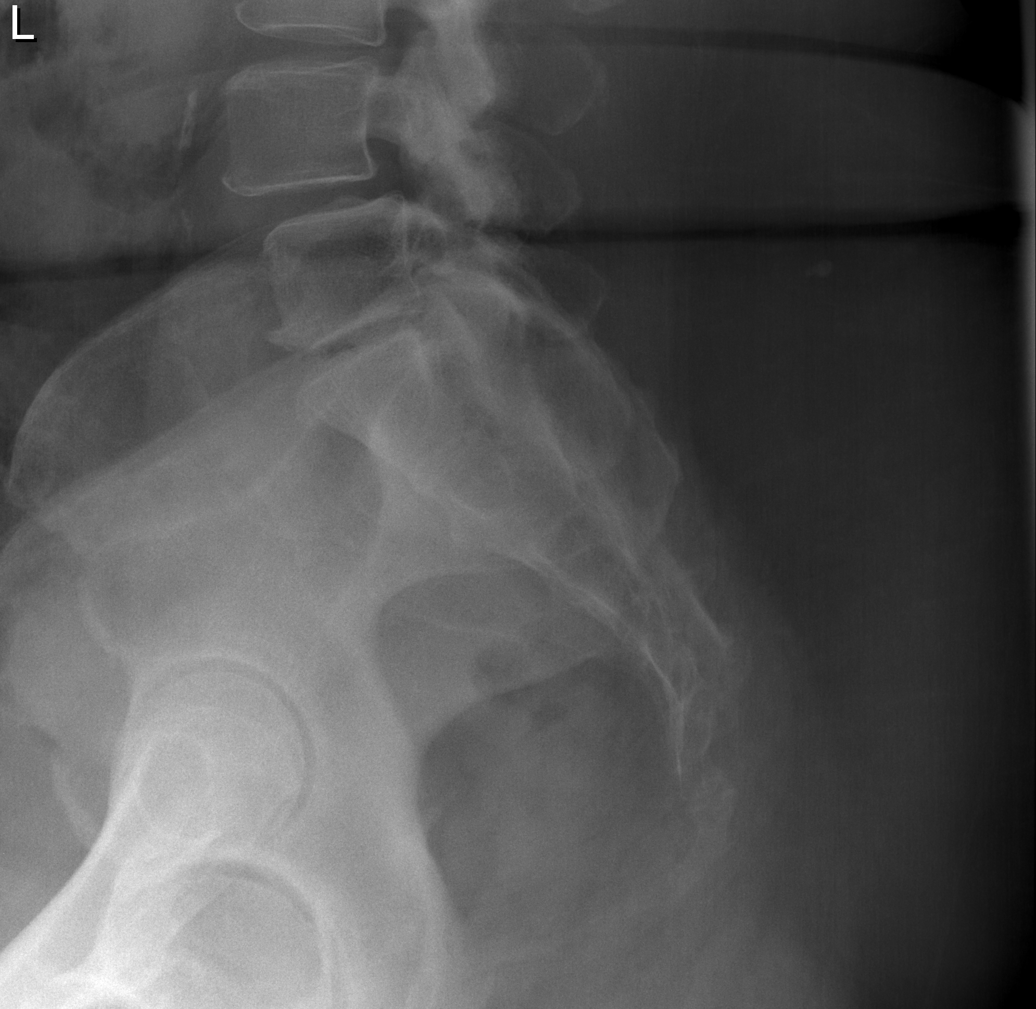

[5 of 5 positions shown; findings below may reference images not displayed]

FINDINGS: Frontal, lateral, spot lumbosacral lateral, and bilateral oblique
views were obtained. There are 5 non-rib-bearing lumbar type
vertebral bodies. There is no fracture. There is 4 mm of
anterolisthesis of L4 on L5, stable. No new spondylolisthesis. There
is moderate disc space narrowing at L5-S1 with vacuum phenomenon at
this level. Other disc spaces appear intact. There is facet
osteoarthritic change at L3-4, L4-5, and L5-S1 bilaterally. There is
atherosclerotic calcification in the distal aorta.
IMPRESSION: Areas of osteoarthritic change. Mild spondylolisthesis at L4-5 is
stable and felt to be due to underlying spondylosis. No fracture
apparent. Atherosclerotic calcification in distal aorta.

## 2016-06-23 IMAGING — CR DG THORACIC SPINE 2V
3 series · 3 of 3 positions shown · non-contrast
Comparison: Chest radiograph September 23, 2014

CLINICAL DATA: Pain following fall down steps

EXAM:
THORACIC SPINE - 3 VIEW

[w t-spine a.p. *]
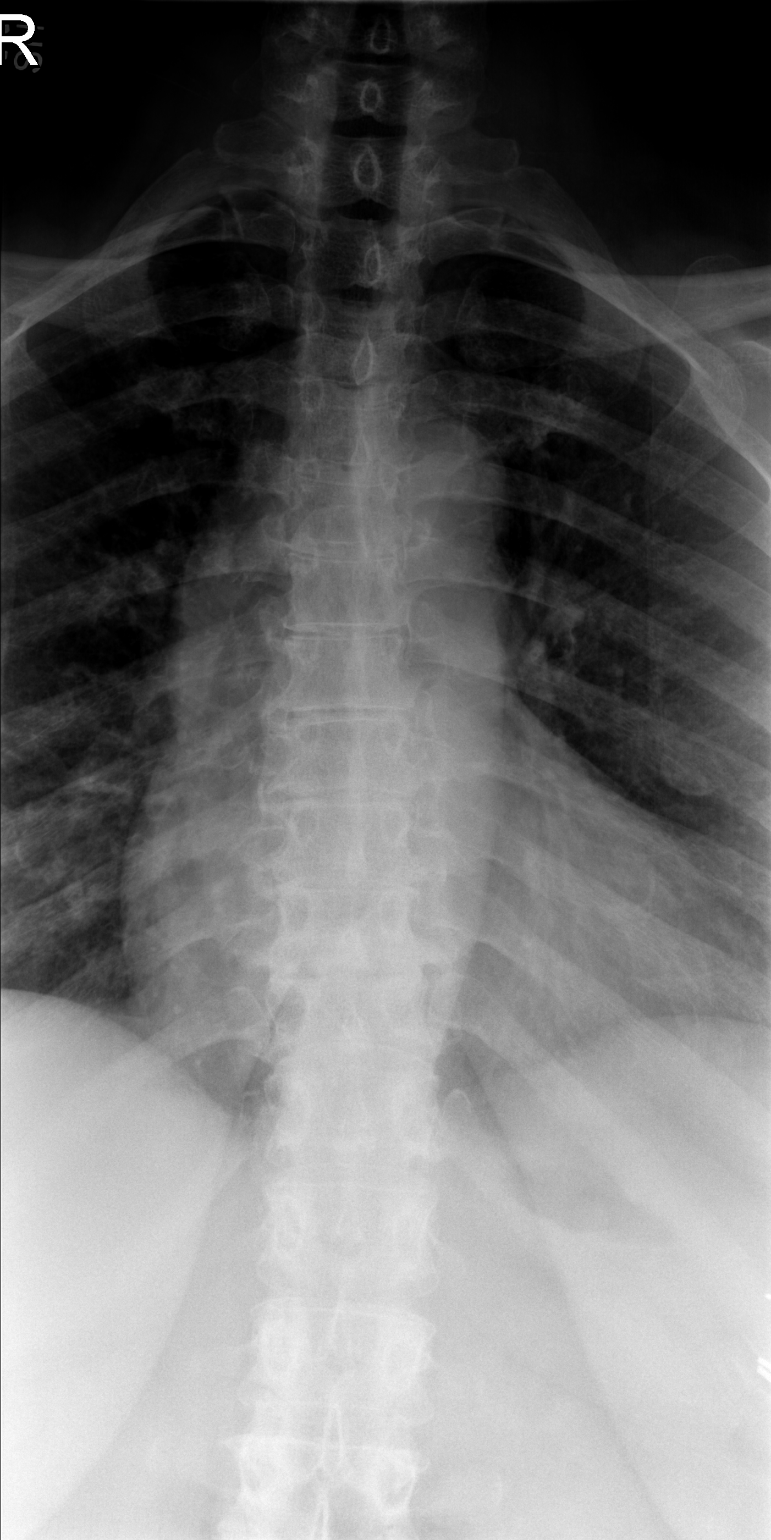

[w t-spine lat *]
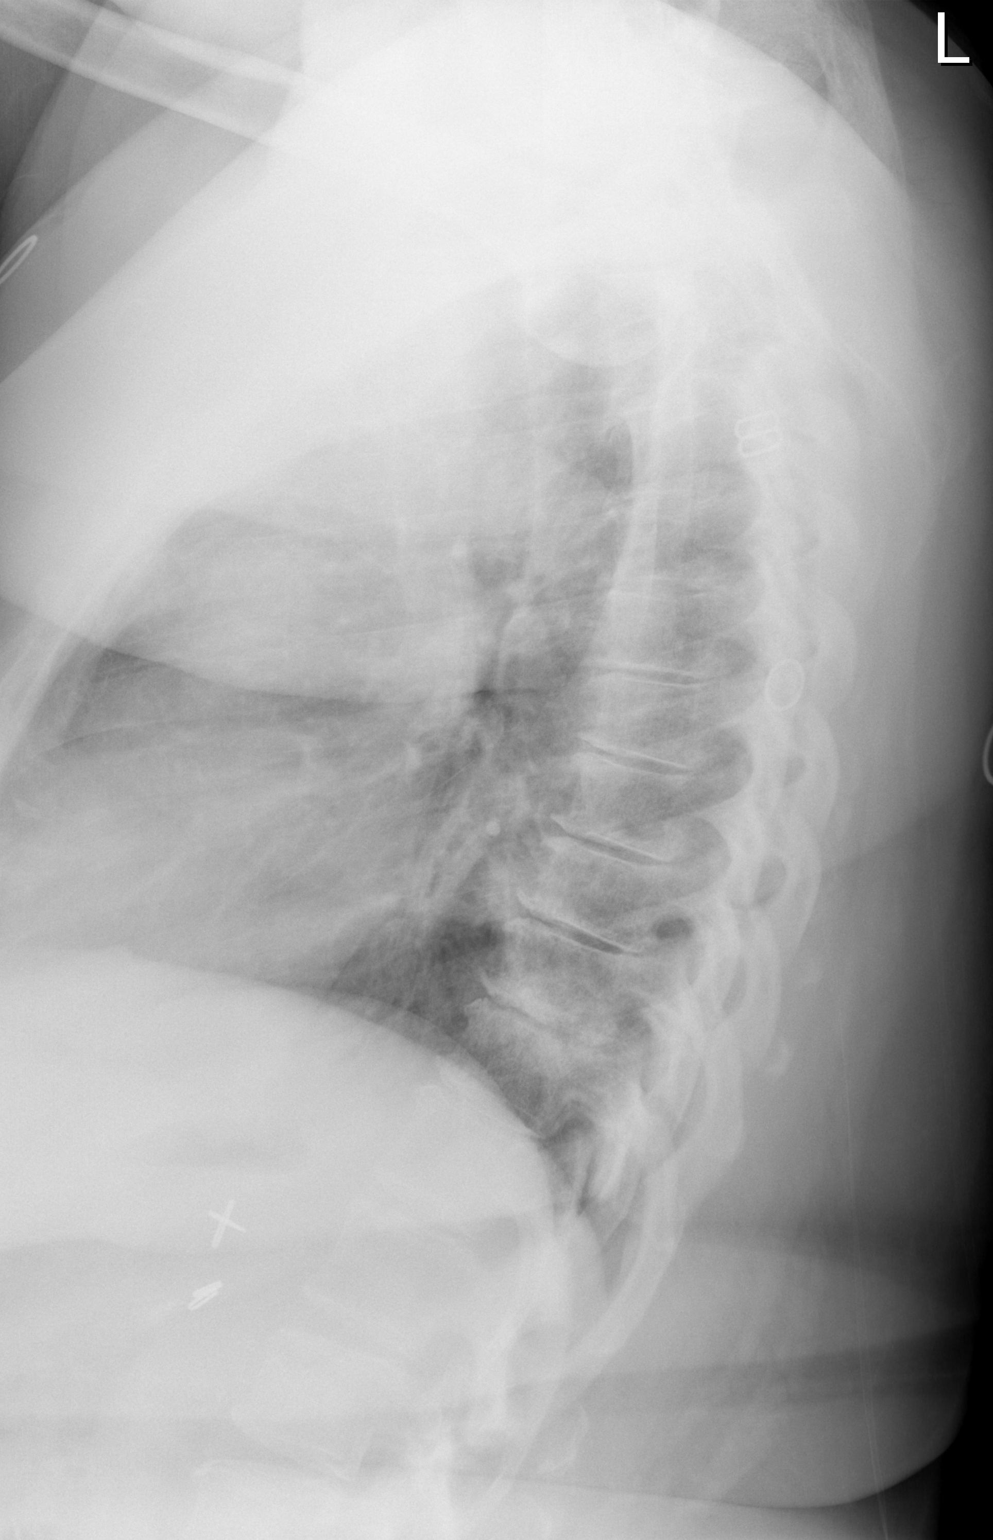

[w swimmers view]
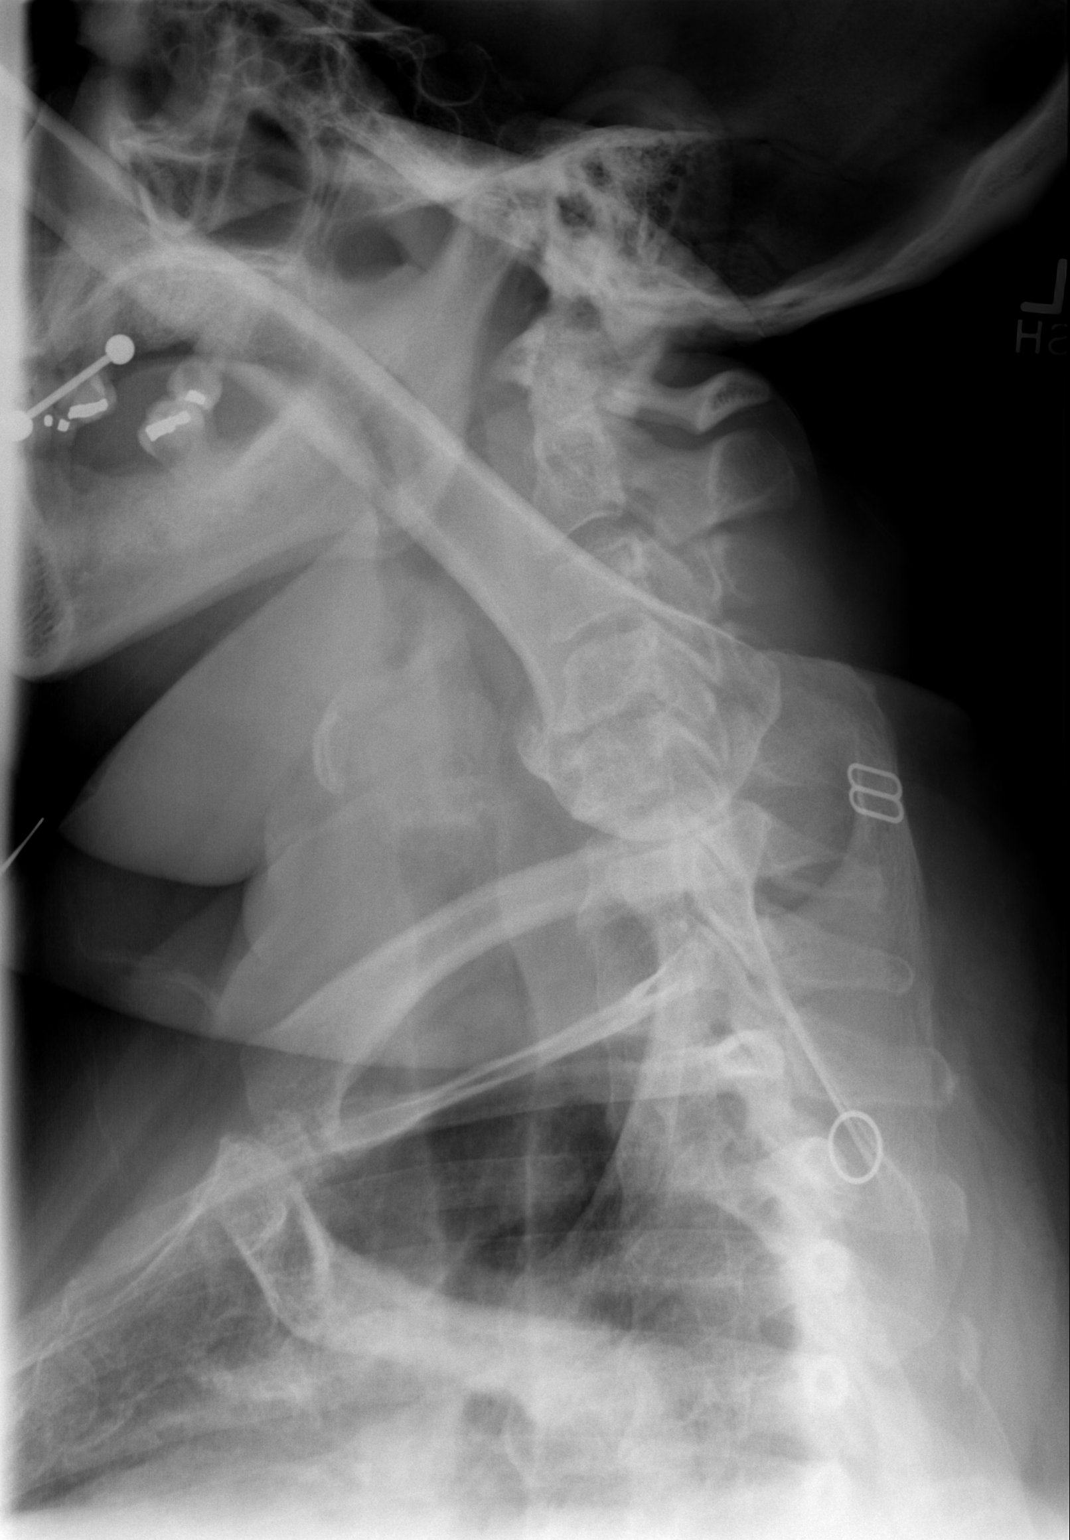

[3 of 3 positions shown; findings below may reference images not displayed]

FINDINGS: Frontal, lateral, and swimmer's views obtained. There is no fracture
or spondylolisthesis. There is disc space narrowing at multiple
levels. There are multiple anterior osteophytes.
IMPRESSION: Osteoarthritic change at multiple levels. No fracture or
spondylolisthesis.

## 2016-06-23 IMAGING — CT CT HEAD W/O CM
4 of 5 series · 16 of 47 positions shown, 17 images · non-contrast
Comparison: 08/28/2013

CLINICAL DATA: Tripped and fell down steps

EXAM:
CT HEAD WITHOUT CONTRAST
CT CERVICAL SPINE WITHOUT CONTRAST
TECHNIQUE: Multidetector CT imaging of the head and cervical spine was
performed following the standard protocol without intravenous
contrast. Multiplanar CT image reconstructions of the cervical spine
were also generated.

[Series 2: head 4.8 h37s · axial · 0.45mm/px · z∈[-87,-39]mm · 2 of 32 slices shown, 3 images]
[im 11/32  brain]
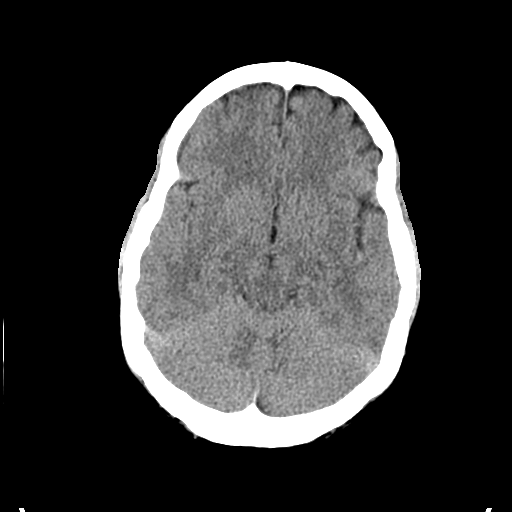
[im 11/32  bone]
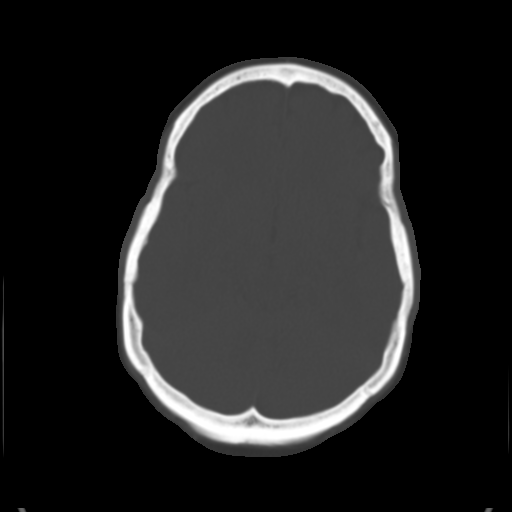
[im 21/32  brain]
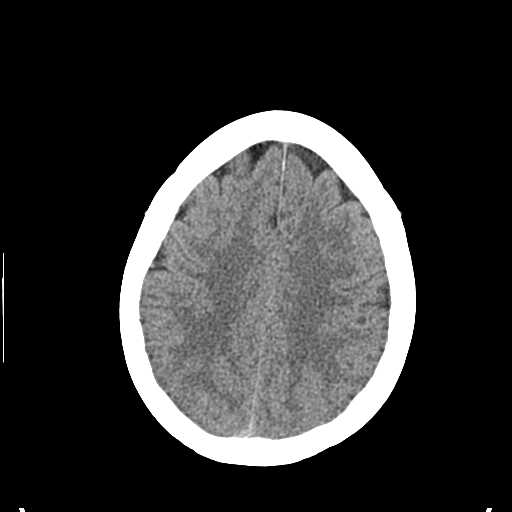

[Series 5: c_spine 2.0 b41s st · axial · 0.34mm/px · z∈[-324,-144]mm · 8 of 108 slices shown]
[im 9/108  brain]
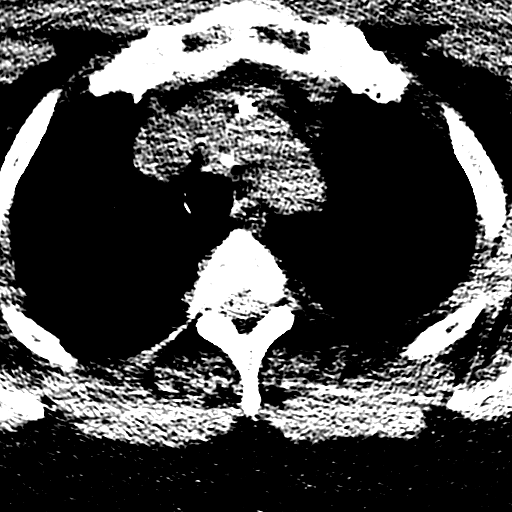
[im 27/108  brain]
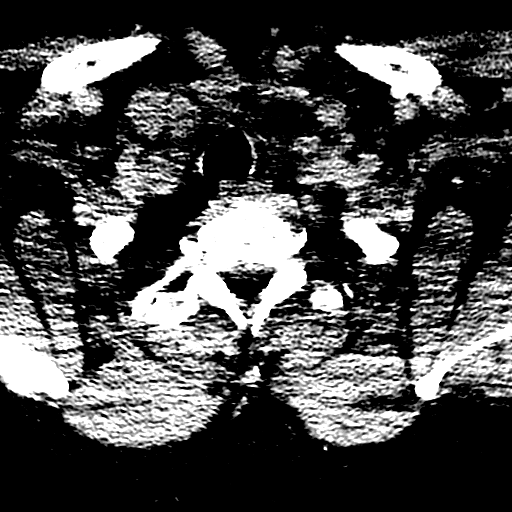
[im 36/108  brain]
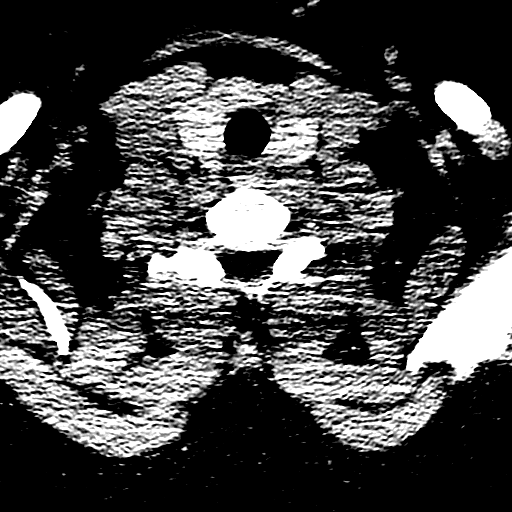
[im 45/108  brain]
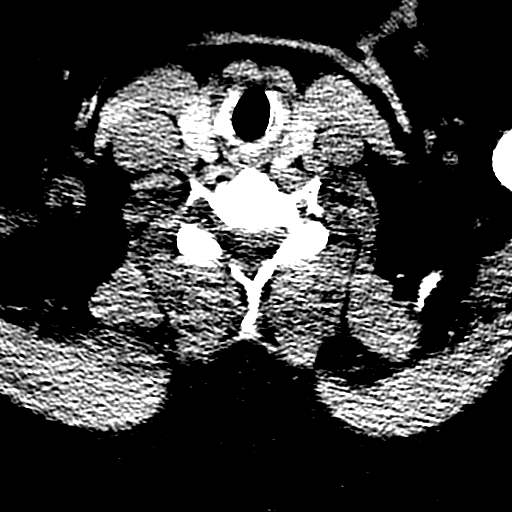
[im 63/108  brain]
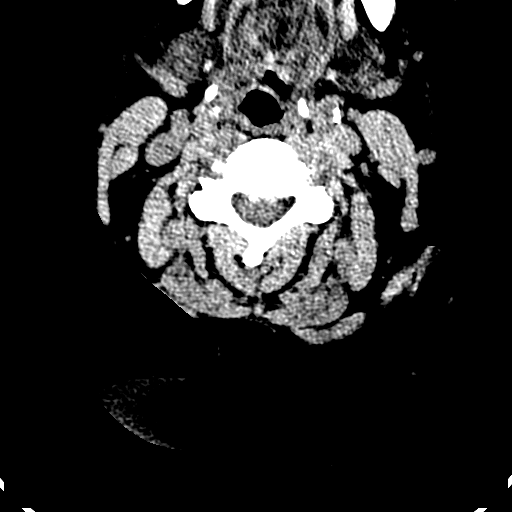
[im 72/108  brain]
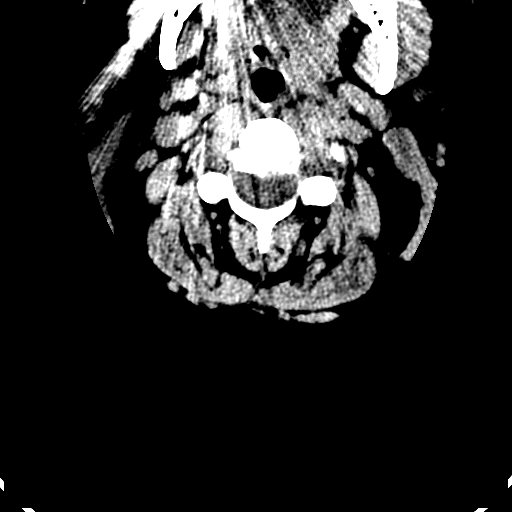
[im 81/108  brain]
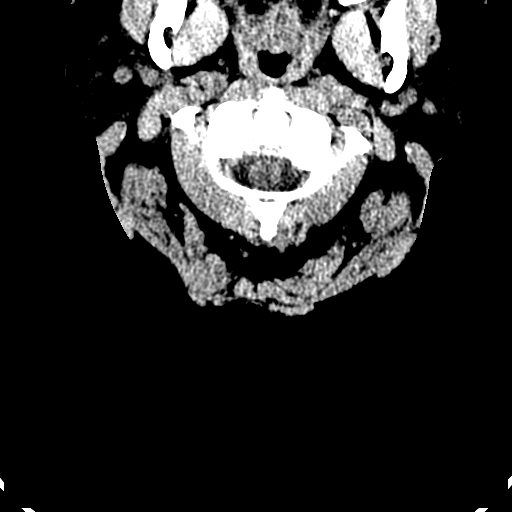
[im 99/108  brain]
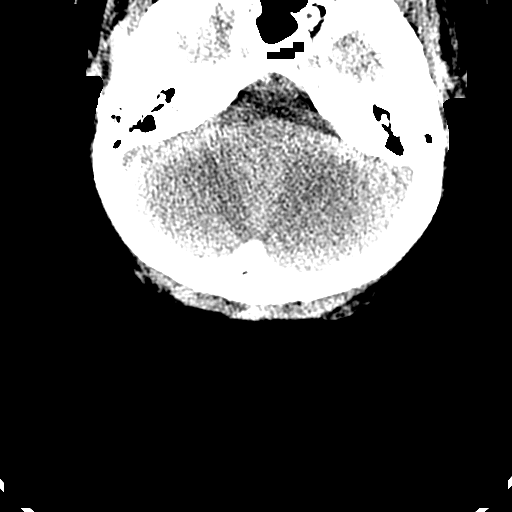

[Series 8: c_spine 2.0 coronal · coronal · 0.36mm/px · 3 of 53 slices shown]
[im 18/53  brain]
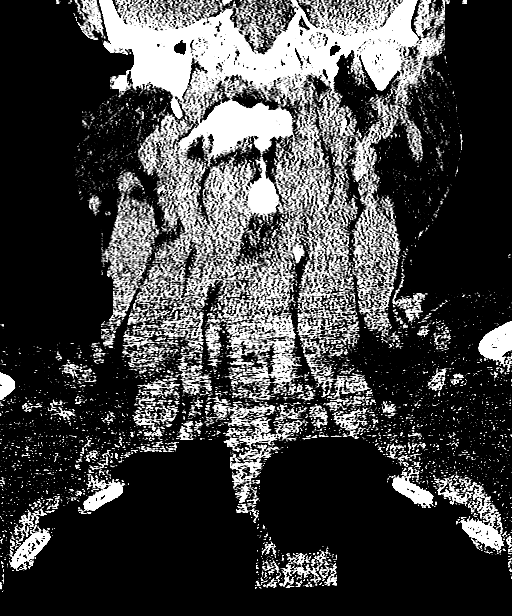
[im 24/53  brain]
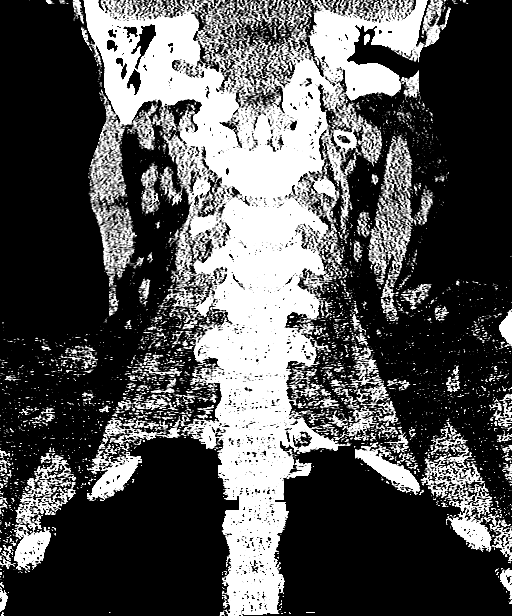
[im 29/53  brain]
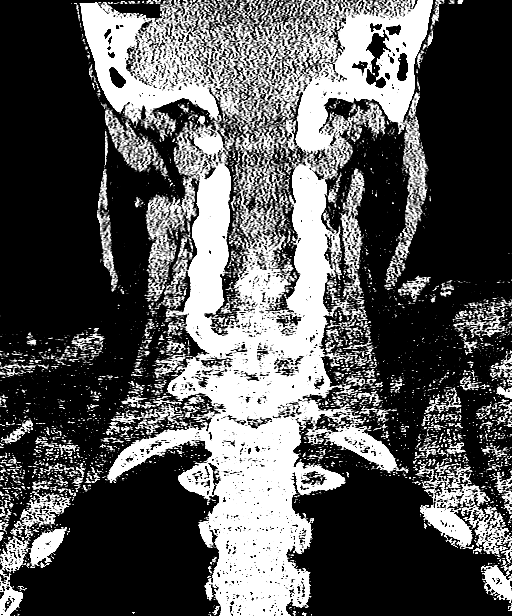

[Series 9: c_spine 2.0 sagittal · sagittal · 0.39mm/px · 3 of 80 slices shown]
[im 27/80  brain]
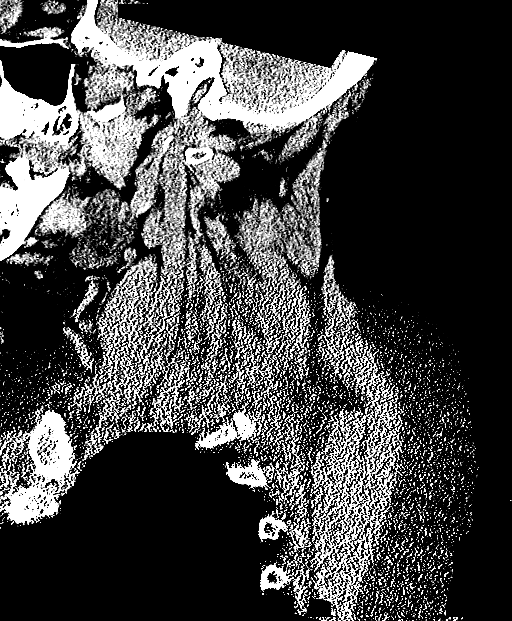
[im 40/80  brain]
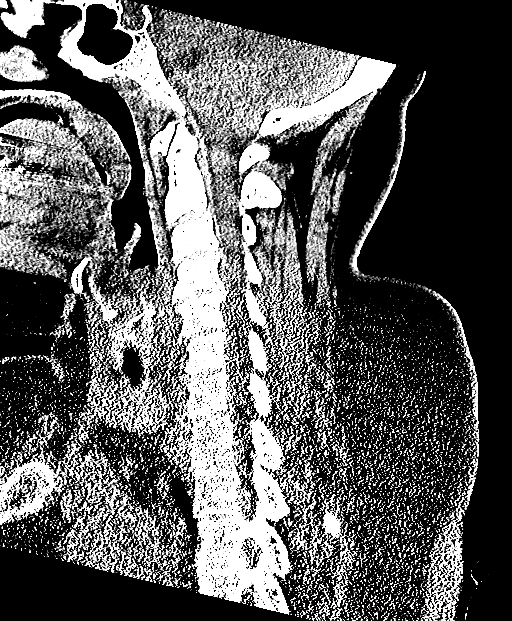
[im 53/80  brain]
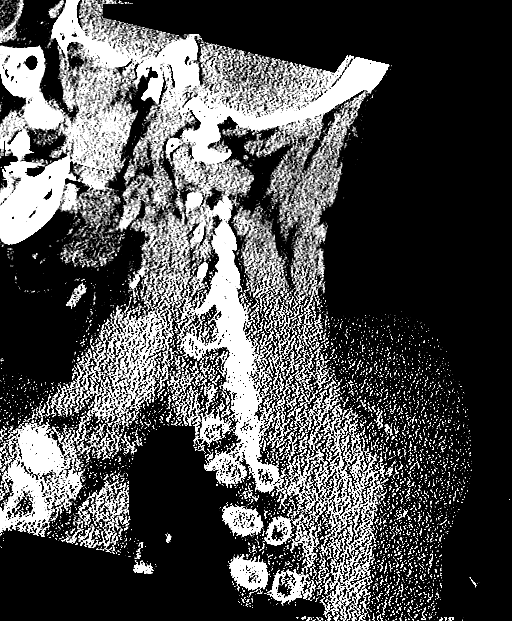

[16 of 47 positions shown; findings below may reference images not displayed]

FINDINGS: CT HEAD FINDINGS

No skull fracture is noted. Paranasal sinuses and mastoid air cells
are unremarkable.

No intracranial hemorrhage, mass effect or midline shift.

No acute cortical infarction. No mass lesion is noted on this
unenhanced scan. Ventricular size is stable from prior exam. No
intra or extra-axial fluid collection.

CT CERVICAL SPINE FINDINGS

Axial images of the cervical spine shows no acute fracture or
subluxation. There is no pneumothorax in visualized lung apices.
Computer processed images shows no acute fracture or subluxation.
Degenerative changes are noted C1-C2 articulation. Minimal disc
space flattening with anterior spurring at C3-C4 level. Mild disc
space flattening with anterior spurring at C4-C5 level. Mild
anterior spurring at C5-C6 level. Minimal anterior spurring at C6-C7
level. Mild anterior spurring at T1-T2 level. No prevertebral soft
tissue swelling. Cervical airway is patent.
IMPRESSION: 1. No acute intracranial abnormality.  No significant change.
2. No cervical spine acute fracture or subluxation. Mild
degenerative changes as described above.

## 2016-06-23 NOTE — Telephone Encounter (Signed)
Returned call to patient's daughter.  She stated that she gave her mom her prep this a.m. Instead of this evening.  I advised her to make sure that she gives the patient plenty of fluids all day and to have her drink the second prep at 9:30 a.m. Tomorrow morning.  I went over all the instructions to daughter as she states that the patient has lost the instructions that were given to her.  The daughter did say patient held her Xarelto as instructed.  All directions and times were given to daughter and she seemed to comprehend all.  All questions were answered.

## 2016-06-24 ENCOUNTER — Encounter: Payer: Self-pay | Admitting: Internal Medicine

## 2016-06-24 ENCOUNTER — Ambulatory Visit (AMBULATORY_SURGERY_CENTER): Payer: Medicare HMO | Admitting: Internal Medicine

## 2016-06-24 VITALS — BP 151/90 | HR 107 | Temp 97.5°F | Resp 14

## 2016-06-24 DIAGNOSIS — R109 Unspecified abdominal pain: Secondary | ICD-10-CM | POA: Diagnosis not present

## 2016-06-24 DIAGNOSIS — Z85038 Personal history of other malignant neoplasm of large intestine: Secondary | ICD-10-CM

## 2016-06-24 DIAGNOSIS — R634 Abnormal weight loss: Secondary | ICD-10-CM | POA: Diagnosis not present

## 2016-06-24 DIAGNOSIS — G8918 Other acute postprocedural pain: Secondary | ICD-10-CM

## 2016-06-24 DIAGNOSIS — K2950 Unspecified chronic gastritis without bleeding: Secondary | ICD-10-CM | POA: Diagnosis not present

## 2016-06-24 MED ORDER — FENTANYL CITRATE (PF) 100 MCG/2ML IJ SOLN
25.0000 ug | Freq: Once | INTRAMUSCULAR | Status: AC
  Administered 2016-06-24: 25 ug via INTRAVENOUS

## 2016-06-24 NOTE — Patient Instructions (Addendum)
YOU HAD AN ENDOSCOPIC PROCEDURE TODAY AT [facility]:   Refer to the procedure report that was given to you for any specific questions about what was found during the examination.  If the procedure report does not answer your questions, please call your gastroenterologist to clarify.  If you requested that your care partner not be given the details of your procedure findings, then the procedure report has been included in a sealed envelope for you to review at your convenience later.  YOU SHOULD EXPECT: Some feelings of bloating in the abdomen. Passage of more gas than usual.  Walking can help get rid of the air that was put into your GI tract during the procedure and reduce the bloating. If you had a lower endoscopy (such as a colonoscopy or flexible sigmoidoscopy) you may notice spotting of blood in your stool or on the toilet paper. If you underwent a bowel prep for your procedure, you may not have a normal bowel movement for a few days.  Please Note:  You might notice some irritation and congestion in your nose or some drainage.  This is from the oxygen used during your procedure.  There is no need for concern and it should clear up in a day or so.  SYMPTOMS TO REPORT IMMEDIATELY:   Following lower endoscopy (colonoscopy or flexible sigmoidoscopy):  Excessive amounts of blood in the stool  Significant tenderness or worsening of abdominal pains  Swelling of the abdomen that is new, acute  Fever of 100F or higher   Following upper endoscopy (EGD)  Vomiting of blood or coffee ground material  New chest pain or pain under the shoulder blades  Painful or persistently difficult swallowing  New shortness of breath  Fever of 100F or higher  Black, tarry-looking stools  For urgent or emergent issues, a gastroenterologist can be reached at any hour by calling [phone].   DIET:  We do recommend a small meal at first, but then you may proceed to your regular diet.  Drink  plenty of fluids but you should avoid alcoholic beverages for 24 hours.  ACTIVITY:  You should plan to take it easy for the rest of today and you should NOT DRIVE or use heavy machinery until tomorrow (because of the sedation medicines used during the test).    FOLLOW UP: Our staff will call the number listed on your records the next business day following your procedure to check on you and address any questions or concerns that you may have regarding the information given to you following your procedure. If we do not reach you, we will leave a message.  However, if you are feeling well and you are not experiencing any problems, there is no need to return our call.  We will assume that you have returned to your regular daily activities without incident.  If any biopsies were taken you will be contacted by phone or by letter within the next 1-3 weeks.  Please call us at [phone] if you have not heard about the biopsies in 3 weeks.    SIGNATURES/CONFIDENTIALITY: You and/or your care partner have signed paperwork which will be entered into your electronic medical record.  These signatures attest to the fact that that the information above on your After Visit Summary has been reviewed and is understood.  Full responsibility of the confidentiality of this discharge information lies with you and/or your care-partner.  Read the handouts given to you by your recovery room nurse.  Continue your xarelto as ordered.

## 2016-06-24 NOTE — Progress Notes (Signed)
Report to PACU, RN, vss, BBS= Clear.  

## 2016-06-24 NOTE — Progress Notes (Signed)
Patient adm to RR and she started having severe left arm pain.  Her mother states that patient has had a stroke and that she hurts all of the time due to arthritis and nerve pain. We tried repositioning the patient and she just screamed out in pain.  We stopped. Patient's mother states that the patient is able to walk, but at this time the patient can't even move to the side of the bed.   Patient states no chest pain nor SOB.  Her mother states that she has a lot of pain.   Dr. Hilarie Fredrickson in to speak with the patient at this time.   Fentanyl 36mcg given per verbal order of Dr. Zenovia Jarred for pain relief.  Patient still moaning. 1535.    1545: pain has eased a lot; dr pyrtle aware and pt will be discharged.  Mother is helping patient get dressed and we both helped her to put on her boots.  Patient was able to dress with assistance.  Patient put herself in     The wheelchair.

## 2016-06-24 NOTE — Progress Notes (Signed)
Called to room to assist during endoscopic procedure.  Patient ID and intended procedure confirmed with present staff. Received instructions for my participation in the procedure from the performing physician.  

## 2016-06-24 NOTE — Op Note (Addendum)
[place] Patient [name] Procedure Date: 06/24/2016 2:29 PM MRN: [id number] Endoscopist: Jerene Bears , MD Age: 61 Referring MD:  Date of Birth: Jun 03, 1956 Gender: Female Account #: [id number] Procedure:                Flexible Sigmoidoscopy Indications:              High risk colon cancer surveillance: Personal                            history of colon cancer, last flexible                            sigmoidoscopy 5 years Medicines:                Monitored Anesthesia Care Procedure:                Pre-Anesthesia Assessment:                           - Prior to the procedure, a History and Physical                            was performed, and patient medications and                            allergies were reviewed. The patient's tolerance of                            previous anesthesia was also reviewed. The risks                            and benefits of the procedure and the sedation                            options and risks were discussed with the patient.                            All questions were answered, and informed consent                            was obtained. Prior Anticoagulants: The patient has                            taken Xarelto (rivaroxaban), last dose was 2 days                            prior to procedure. ASA Grade Assessment: III - A                            patient with severe systemic disease. After                            reviewing the risks and benefits, the patient was  deemed in satisfactory condition to undergo the                            procedure.                           After obtaining informed consent, the scope was                            passed under direct vision.After obtaining informed                            consent, the scope was passed under direct vision.                            The Model [id number] scope was   introduced through the anus and advanced to the the                            ileo-sigmoid anastomosis. The flexible                            sigmoidoscopy was accomplished without difficulty.                            The patient tolerated the procedure well. The                            quality of the bowel preparation was good. Scope In: Scope Out: Findings:                 The digital rectal exam was normal.                           The ileum appeared normal.                           The colon (entire examined portion) appeared normal                            (sigmoid and rectum).                           The retroflexed view of the distal rectum and anal                            verge was normal and showed no anal or rectal                            abnormalities. Complications:            No immediate complications. Estimated Blood Loss:     Estimated blood loss: none. Impression:               - The terminal ileum is normal.                           -  The entire examined colon is normal.                           - No specimens collected. Recommendation:           - Patient has a contact number available for                            emergencies. The signs and symptoms of potential                            delayed complications were discussed with the                            patient. Return to normal activities tomorrow.                            Written discharge instructions were provided to the                            patient.                           - Resume previous diet.                           - Repeat flexible sigmoidoscopy in 5 years for                            surveillance.                           - Resume Xarelto tomorrow at usual dose. Jerene Bears, MD 06/24/2016 3:17:47 PM This report has been signed electronically.

## 2016-06-24 NOTE — Op Note (Signed)
[place] Patient [name] Procedure Date: 06/24/2016 2:29 PM MRN: [id number] Endoscopist: Jerene Bears , MD Age: 61 Referring MD:  Date of Birth: 05/27/1956 Gender: Female Account #: [id number] Procedure:                Upper GI endoscopy Indications:              Abdominal pain, Gastro-esophageal reflux disease,                            Weight loss Medicines:                Monitored Anesthesia Care Procedure:                Pre-Anesthesia Assessment:                           - Prior to the procedure, a History and Physical                            was performed, and patient medications and                            allergies were reviewed. The patient's tolerance of                            previous anesthesia was also reviewed. The risks                            and benefits of the procedure and the sedation                            options and risks were discussed with the patient.                            All questions were answered, and informed consent                            was obtained. Prior Anticoagulants: The patient has                            taken no previous anticoagulant or antiplatelet                            agents. ASA Grade Assessment: III - A patient with                            severe systemic disease. After reviewing the risks                            and benefits, the patient was deemed in                            satisfactory condition to undergo the procedure.  After obtaining informed consent, the endoscope was                            passed under direct vision. Throughout the                            procedure, the patient's blood pressure, pulse, and                            oxygen saturations were monitored continuously. The                            Model [id number]) scope was introduced                            through the mouth, and advanced to the  second part                            of duodenum. The upper GI endoscopy was                            accomplished without difficulty. The patient                            tolerated the procedure well. Scope In: Scope Out: Findings:                 The examined esophagus was normal.                           Patchy moderate inflammation characterized by                            erosions and erythema was found in the gastric                            antrum. Biopsies were taken with a cold forceps for                            Helicobacter pylori testing.                           The cardia and gastric fundus were normal on                            retroflexion.                           The examined duodenum was normal. Complications:            No immediate complications. Estimated Blood Loss:     Estimated blood loss was minimal. Impression:               - Normal esophagus.                           - Gastritis. Biopsied.                           -  Normal examined duodenum. Recommendation:           - Patient has a contact number available for                            emergencies. The signs and symptoms of potential                            delayed complications were discussed with the                            patient. Return to normal activities tomorrow.                            Written discharge instructions were provided to the                            patient.                           - Resume previous diet.                           - Continue present medications.                           - Await pathology results. Jerene Bears, MD 06/24/2016 3:10:58 PM This report has been signed electronically.

## 2016-06-25 ENCOUNTER — Telehealth: Payer: Self-pay | Admitting: *Deleted

## 2016-06-25 NOTE — Telephone Encounter (Signed)
  Follow up Call-  Call back number 06/24/2016  Post procedure Call Back phone  # [phone]  Permission to leave phone message Yes  Some recent data might be hidden   spoke with daughter  Patient questions:  Do you have a fever, pain , or abdominal swelling? No. Pain Score  0 *  Have you tolerated food without any problems? Yes.    Have you been able to return to your normal activities? Yes.    Do you have any questions about your discharge instructions: Diet   No. Medications  No. Follow up visit  No.  Do you have questions or concerns about your Care? No.  Actions: * If pain score is 4 or above: No action needed, pain <4.

## 2016-07-02 ENCOUNTER — Encounter: Payer: Self-pay | Admitting: Internal Medicine

## 2016-07-06 IMAGING — CT CT L SPINE W/O CM
3 series · 13 of 33 positions shown, 16 images · non-contrast
Comparison: 10/13/2014.

CLINICAL DATA: Acute onset lumbago/low back pain radiating down
both legs.

EXAM:
CT LUMBAR SPINE WITHOUT CONTRAST
TECHNIQUE: Multidetector CT imaging of the lumbar spine was performed without
intravenous contrast administration. Multiplanar CT image
reconstructions were also generated.

[Series 3: spine 2.0 b31s st · axial · 0.38mm/px · z∈[+1014,+1164]mm · 5 of 109 slices shown, 7 images]
[im 17/109  soft-tissue]
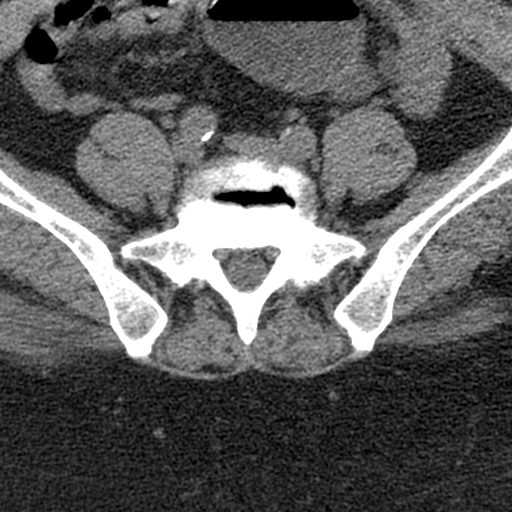
[im 17/109  bone]
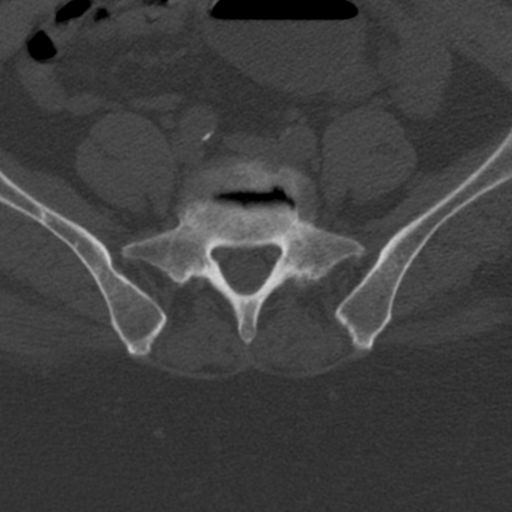
[im 34/109  bone]
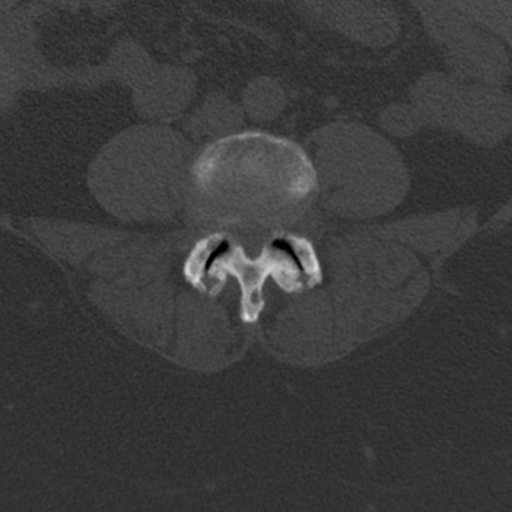
[im 59/109  bone]
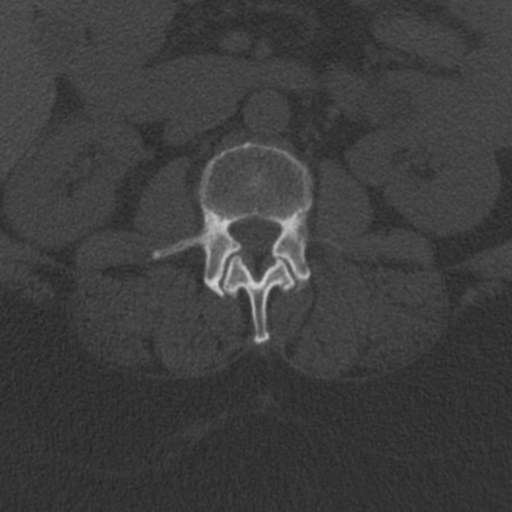
[im 75/109  bone]
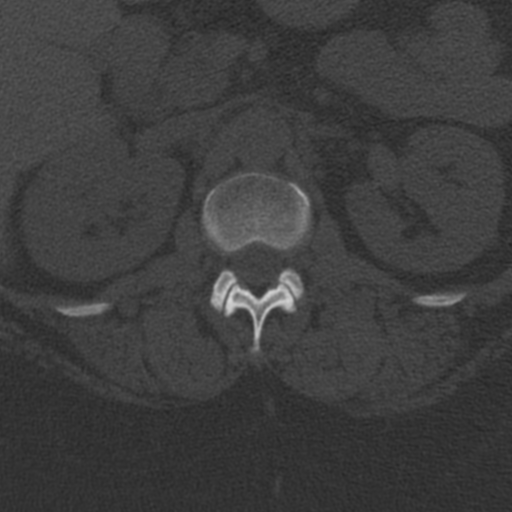
[im 92/109  soft-tissue]
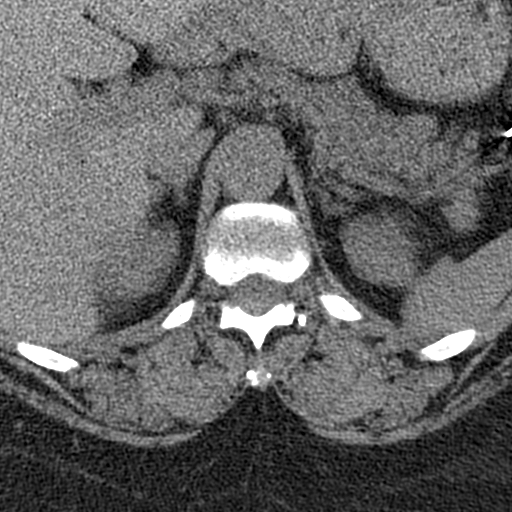
[im 92/109  bone]
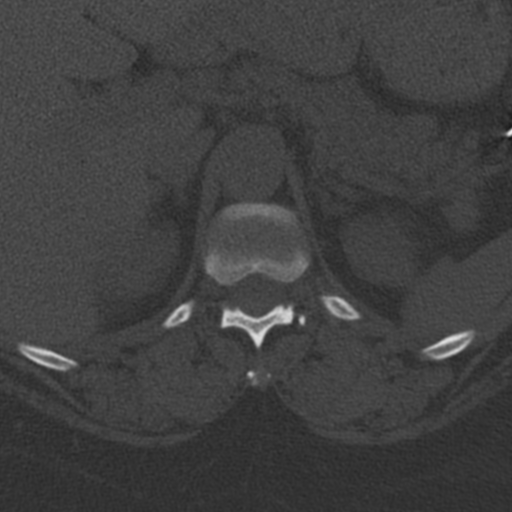

[Series 6: spine 2.0 coronal st · coronal · 0.36mm/px · 3 of 78 slices shown]
[im 16/78  bone]
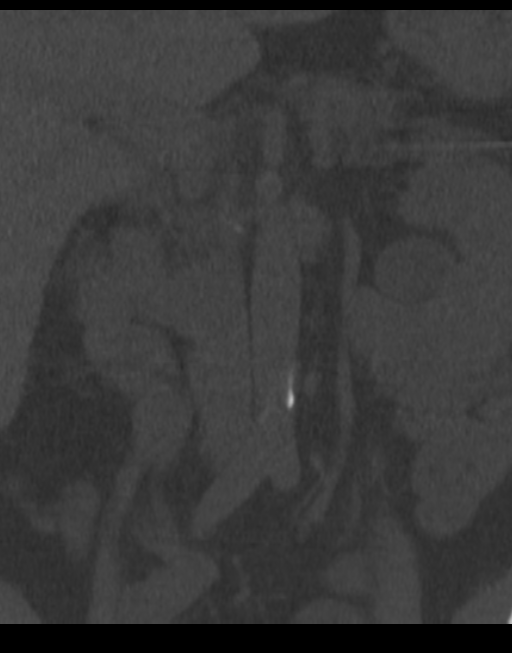
[im 31/78  bone]
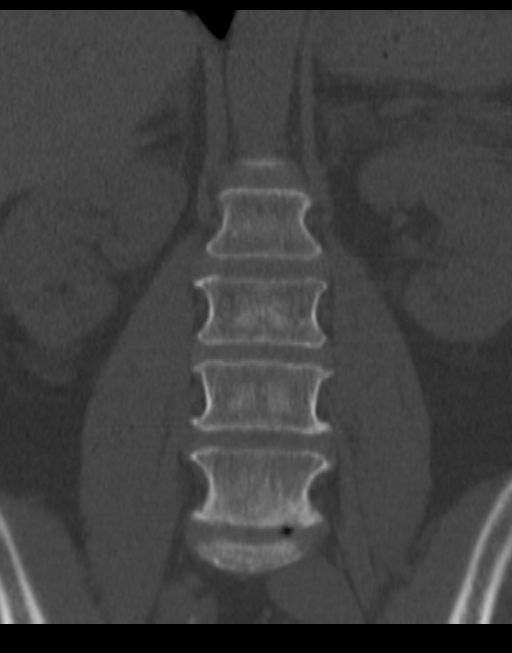
[im 47/78  bone]
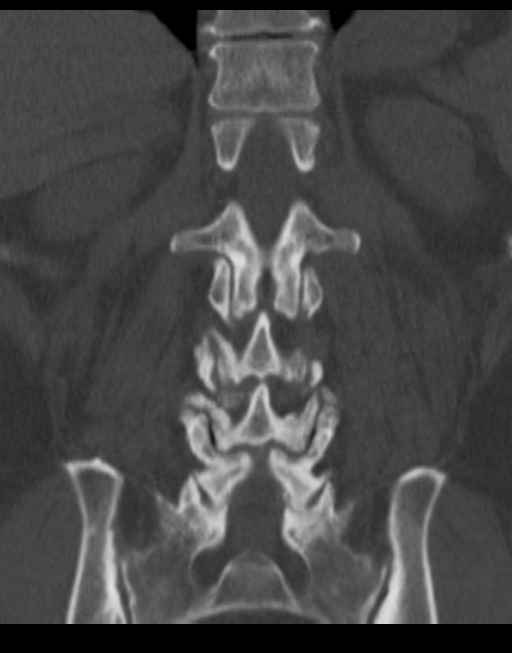

[Series 7: spine 2.0 sagittal st · sagittal · 0.33mm/px · 5 of 90 slices shown, 6 images]
[im 30/90  bone]
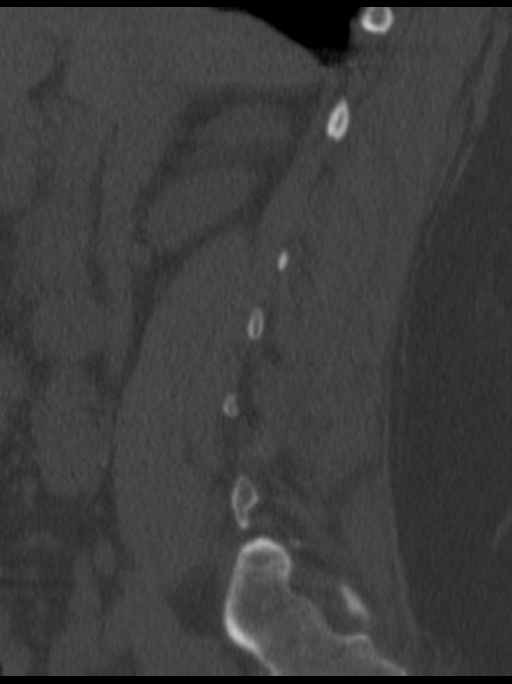
[im 38/90  bone]
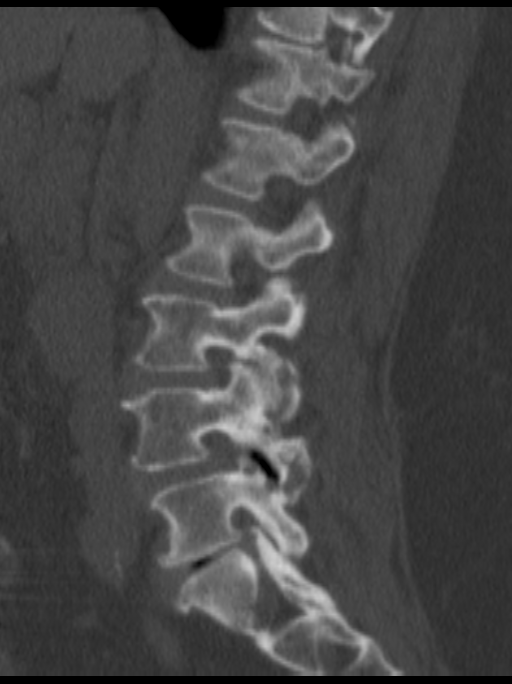
[im 45/90  soft-tissue]
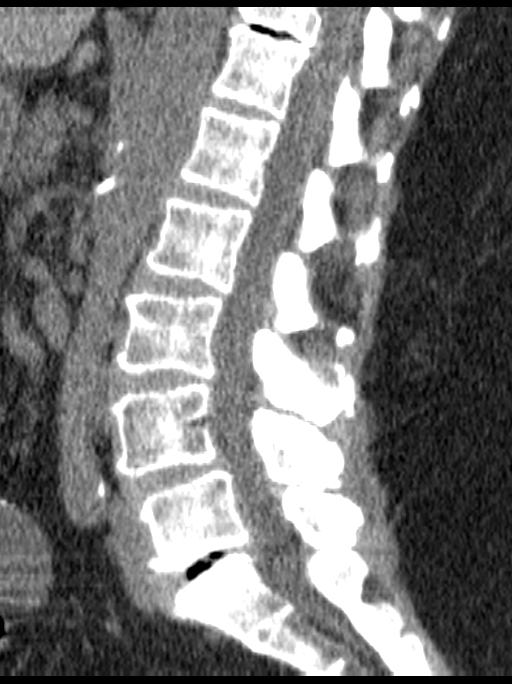
[im 45/90  bone]
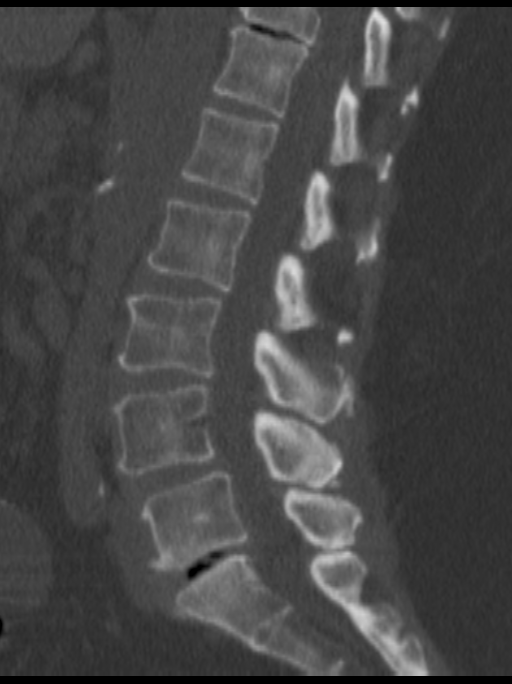
[im 52/90  bone]
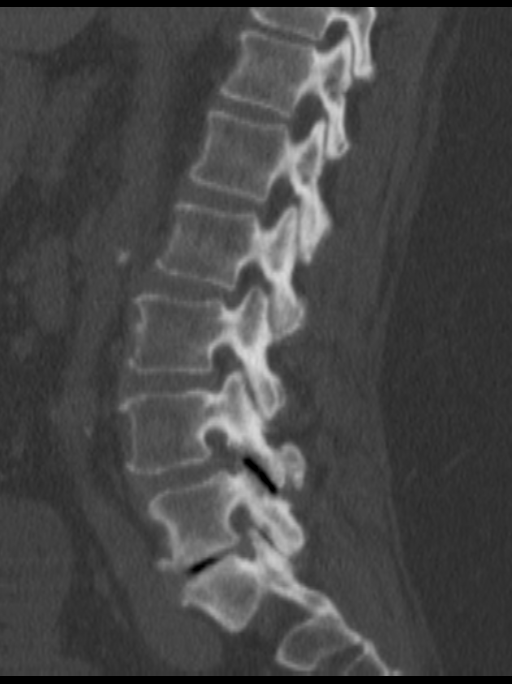
[im 60/90  bone]
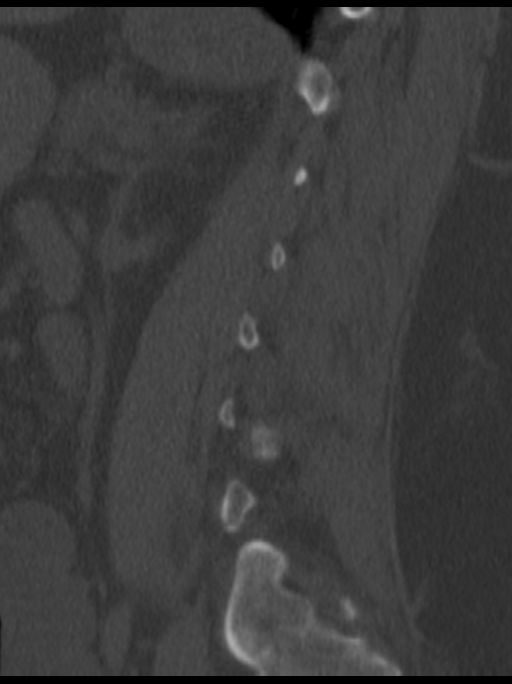

[13 of 33 positions shown; findings below may reference images not displayed]

FINDINGS: No acute lumbar compression fractures present. There are 5 lumbar
type vertebral bodies. The alignment shows grade I retrolisthesis of
L5 on S1 measuring 3 mm secondary to collapse of the L5-S1 disc.
Grade I anterolisthesis of L4 on L5 is present also measuring 3 mm.
At L4-L5, the anterolisthesis is degenerative and facet mediated.
Trace retrolisthesis of L3 on L4 and L2 on L3 is also present.
Paraspinal soft tissues demonstrate atherosclerosis. Exophytic
cystic lesion in the inferior LEFT renal pole is compatible with a
renal cyst. Bilateral sacroiliac joint osteoarthritis with
subchondral sclerosis. No displaced sacral fracture or iliac bone
fracture.

T11-T12: Vacuum disc. Mild symmetric bilateral foraminal stenosis.
Trace retrolisthesis. RIGHT facet arthrosis.

T12-L1:  Negative.

L1-L2:  Negative.

L2-L3: Disc degeneration and loss of height. Central canal and
foramina appear patent.

L3-L4: Bilateral severe facet arthrosis. Subchondral cysts are
present bilaterally. Central canal appears patent. Disc degeneration
with circumferential disc bulging. Mild bilateral foraminal stenosis
associated with facet arthrosis, loss of disc height and disc
bulging.

L4-L5: Severe bilateral facet arthrosis with bilateral vacuum joint.
There is also a cyst in the spinous process of L4, bony central
stenosis is moderate and there is bilateral subarticular stenosis.
Moderate to severe bilateral foraminal stenosis.

L5-S1: Severe disc degeneration. No significant central stenosis.
Bilateral foraminal stenosis due to endplate spurring and facet
arthrosis along with listhesis.
IMPRESSION: 1. No acute osseous abnormality. Negative for compression fracture
in this patient with a recent fall.
2. Moderate lumbar spondylosis and facet arthrosis with central
stenosis most pronounced at L4-L5.

## 2016-08-07 ENCOUNTER — Inpatient Hospital Stay (HOSPITAL_COMMUNITY)
Admission: EM | Admit: 2016-08-07 | Discharge: 2016-08-10 | DRG: 190 | Disposition: A | Discharge status: Home / Self Care | Payer: Medicare HMO | Attending: Family Medicine | Admitting: Family Medicine

## 2016-08-07 ENCOUNTER — Emergency Department (HOSPITAL_COMMUNITY): Payer: Medicare HMO

## 2016-08-07 DIAGNOSIS — J440 Chronic obstructive pulmonary disease with acute lower respiratory infection: Principal | ICD-10-CM | POA: Diagnosis present

## 2016-08-07 DIAGNOSIS — I639 Cerebral infarction, unspecified: Secondary | ICD-10-CM | POA: Diagnosis present

## 2016-08-07 DIAGNOSIS — Z91018 Allergy to other foods: Secondary | ICD-10-CM

## 2016-08-07 DIAGNOSIS — Z720 Tobacco use: Secondary | ICD-10-CM | POA: Diagnosis present

## 2016-08-07 DIAGNOSIS — J9600 Acute respiratory failure, unspecified whether with hypoxia or hypercapnia: Secondary | ICD-10-CM | POA: Diagnosis present

## 2016-08-07 DIAGNOSIS — G4700 Insomnia, unspecified: Secondary | ICD-10-CM | POA: Diagnosis present

## 2016-08-07 DIAGNOSIS — Z85038 Personal history of other malignant neoplasm of large intestine: Secondary | ICD-10-CM

## 2016-08-07 DIAGNOSIS — I10 Essential (primary) hypertension: Secondary | ICD-10-CM | POA: Diagnosis present

## 2016-08-07 DIAGNOSIS — F17210 Nicotine dependence, cigarettes, uncomplicated: Secondary | ICD-10-CM | POA: Diagnosis present

## 2016-08-07 DIAGNOSIS — F329 Major depressive disorder, single episode, unspecified: Secondary | ICD-10-CM | POA: Diagnosis present

## 2016-08-07 DIAGNOSIS — G894 Chronic pain syndrome: Secondary | ICD-10-CM | POA: Diagnosis present

## 2016-08-07 DIAGNOSIS — K219 Gastro-esophageal reflux disease without esophagitis: Secondary | ICD-10-CM | POA: Diagnosis present

## 2016-08-07 DIAGNOSIS — R0602 Shortness of breath: Secondary | ICD-10-CM | POA: Diagnosis not present

## 2016-08-07 DIAGNOSIS — Z7901 Long term (current) use of anticoagulants: Secondary | ICD-10-CM

## 2016-08-07 DIAGNOSIS — J9601 Acute respiratory failure with hypoxia: Secondary | ICD-10-CM | POA: Diagnosis present

## 2016-08-07 DIAGNOSIS — R509 Fever, unspecified: Secondary | ICD-10-CM

## 2016-08-07 DIAGNOSIS — Z886 Allergy status to analgesic agent status: Secondary | ICD-10-CM

## 2016-08-07 DIAGNOSIS — R4701 Aphasia: Secondary | ICD-10-CM | POA: Diagnosis present

## 2016-08-07 DIAGNOSIS — Z79899 Other long term (current) drug therapy: Secondary | ICD-10-CM

## 2016-08-07 DIAGNOSIS — Z86718 Personal history of other venous thrombosis and embolism: Secondary | ICD-10-CM

## 2016-08-07 DIAGNOSIS — I69320 Aphasia following cerebral infarction: Secondary | ICD-10-CM

## 2016-08-07 DIAGNOSIS — G4733 Obstructive sleep apnea (adult) (pediatric): Secondary | ICD-10-CM | POA: Diagnosis present

## 2016-08-07 DIAGNOSIS — E785 Hyperlipidemia, unspecified: Secondary | ICD-10-CM | POA: Diagnosis present

## 2016-08-07 DIAGNOSIS — R569 Unspecified convulsions: Secondary | ICD-10-CM

## 2016-08-07 DIAGNOSIS — I350 Nonrheumatic aortic (valve) stenosis: Secondary | ICD-10-CM | POA: Diagnosis present

## 2016-08-07 DIAGNOSIS — F419 Anxiety disorder, unspecified: Secondary | ICD-10-CM | POA: Diagnosis present

## 2016-08-07 DIAGNOSIS — Z6841 Body Mass Index (BMI) 40.0 and over, adult: Secondary | ICD-10-CM

## 2016-08-07 DIAGNOSIS — J441 Chronic obstructive pulmonary disease with (acute) exacerbation: Secondary | ICD-10-CM | POA: Diagnosis present

## 2016-08-07 DIAGNOSIS — E876 Hypokalemia: Secondary | ICD-10-CM | POA: Diagnosis present

## 2016-08-07 DIAGNOSIS — M549 Dorsalgia, unspecified: Secondary | ICD-10-CM | POA: Diagnosis present

## 2016-08-07 DIAGNOSIS — G40909 Epilepsy, unspecified, not intractable, without status epilepticus: Secondary | ICD-10-CM | POA: Diagnosis present

## 2016-08-07 DIAGNOSIS — T502X5A Adverse effect of carbonic-anhydrase inhibitors, benzothiadiazides and other diuretics, initial encounter: Secondary | ICD-10-CM | POA: Diagnosis present

## 2016-08-07 DIAGNOSIS — J209 Acute bronchitis, unspecified: Secondary | ICD-10-CM | POA: Diagnosis present

## 2016-08-07 DIAGNOSIS — Z79891 Long term (current) use of opiate analgesic: Secondary | ICD-10-CM

## 2016-08-07 LAB — I-STAT CG4 LACTIC ACID, ED: LACTIC ACID, VENOUS: 3.47 mmol/L — AB (ref 0.5–1.9)

## 2016-08-07 LAB — INFLUENZA PANEL BY PCR (TYPE A & B)
INFLBPCR: NEGATIVE
Influenza A By PCR: NEGATIVE

## 2016-08-07 LAB — CBC WITH DIFFERENTIAL/PLATELET
Basophils Absolute: 0 10*3/uL (ref 0.0–0.1)
Basophils Relative: 0 %
Eosinophils Absolute: 0 10*3/uL (ref 0.0–0.7)
Eosinophils Relative: 0 %
HEMATOCRIT: 36.9 % (ref 36.0–46.0)
HEMOGLOBIN: 12.3 g/dL (ref 12.0–15.0)
LYMPHS PCT: 5 %
Lymphs Abs: 0.4 10*3/uL — ABNORMAL LOW (ref 0.7–4.0)
MCH: 32.1 pg (ref 26.0–34.0)
MCHC: 33.3 g/dL (ref 30.0–36.0)
MCV: 96.3 fL (ref 78.0–100.0)
MONO ABS: 0.1 10*3/uL (ref 0.1–1.0)
MONOS PCT: 1 %
NEUTROS ABS: 8 10*3/uL — AB (ref 1.7–7.7)
NEUTROS PCT: 94 %
Platelets: 139 10*3/uL — ABNORMAL LOW (ref 150–400)
RBC: 3.83 MIL/uL — ABNORMAL LOW (ref 3.87–5.11)
RDW: 14.3 % (ref 11.5–15.5)
WBC: 8.6 10*3/uL (ref 4.0–10.5)

## 2016-08-07 MED ORDER — SODIUM CHLORIDE 0.9 % IV BOLUS (SEPSIS)
1000.0000 mL | Freq: Once | INTRAVENOUS | Status: AC
Start: 2016-08-07 — End: 2016-08-07
  Administered 2016-08-07: 1000 mL via INTRAVENOUS

## 2016-08-07 MED ORDER — IPRATROPIUM BROMIDE 0.02 % IN SOLN
0.5000 mg | Freq: Once | RESPIRATORY_TRACT | Status: AC
  Administered 2016-08-07: 0.5 mg via RESPIRATORY_TRACT
  Filled 2016-08-07: qty 2.5

## 2016-08-07 MED ORDER — ALBUTEROL SULFATE (2.5 MG/3ML) 0.083% IN NEBU
5.0000 mg | INHALATION_SOLUTION | Freq: Once | RESPIRATORY_TRACT | Status: AC
  Administered 2016-08-07: 5 mg via RESPIRATORY_TRACT

## 2016-08-07 MED ORDER — METHYLPREDNISOLONE SODIUM SUCC 125 MG IJ SOLR
125.0000 mg | Freq: Once | INTRAMUSCULAR | Status: AC
  Administered 2016-08-07: 125 mg via INTRAVENOUS
  Filled 2016-08-07: qty 2

## 2016-08-07 MED ORDER — SODIUM CHLORIDE 0.9 % IV BOLUS (SEPSIS)
1000.0000 mL | Freq: Once | INTRAVENOUS | Status: AC
  Administered 2016-08-07: 1000 mL via INTRAVENOUS

## 2016-08-07 MED ORDER — ALBUTEROL SULFATE (2.5 MG/3ML) 0.083% IN NEBU
5.0000 mg | INHALATION_SOLUTION | Freq: Once | RESPIRATORY_TRACT | Status: AC
  Administered 2016-08-07: 5 mg via RESPIRATORY_TRACT
  Filled 2016-08-07: qty 6

## 2016-08-07 MED ORDER — MORPHINE SULFATE (PF) 4 MG/ML IV SOLN
4.0000 mg | Freq: Once | INTRAVENOUS | Status: AC
  Administered 2016-08-07: 4 mg via INTRAVENOUS
  Filled 2016-08-07: qty 1

## 2016-08-07 MED ORDER — ALBUTEROL SULFATE (2.5 MG/3ML) 0.083% IN NEBU
5.0000 mg | INHALATION_SOLUTION | Freq: Once | RESPIRATORY_TRACT | Status: DC
  Filled 2016-08-07: qty 6

## 2016-08-07 MED ORDER — DEXTROSE 5 % IV SOLN
500.0000 mg | INTRAVENOUS | Status: DC
  Administered 2016-08-07: 500 mg via INTRAVENOUS
  Filled 2016-08-07: qty 500

## 2016-08-07 MED ORDER — OXYCODONE-ACETAMINOPHEN 5-325 MG PO TABS
1.0000 | ORAL_TABLET | Freq: Once | ORAL | Status: AC
  Administered 2016-08-07: 1 via ORAL
  Filled 2016-08-07: qty 1

## 2016-08-07 MED ORDER — DEXTROSE 5 % IV SOLN
1.0000 g | INTRAVENOUS | Status: DC
  Administered 2016-08-07: 1 g via INTRAVENOUS
  Filled 2016-08-07: qty 10

## 2016-08-07 MED ORDER — ACETAMINOPHEN 325 MG PO TABS
650.0000 mg | ORAL_TABLET | Freq: Once | ORAL | Status: AC
  Administered 2016-08-07: 650 mg via ORAL
  Filled 2016-08-07: qty 2

## 2016-08-07 NOTE — ED Notes (Signed)
Patient transported to X-ray 

## 2016-08-07 NOTE — ED Notes (Signed)
Bed: RESB Expected date:  Expected time:  Means of arrival:  Comments: EMS 50's, SOB

## 2016-08-07 NOTE — ED Notes (Signed)
EDP in room to speak with family

## 2016-08-07 NOTE — ED Provider Notes (Signed)
[facility] DEPT Provider Note   CSN: [id number] Arrival date & time: 08/07/16  1918     History   Chief Complaint Chief Complaint  Patient presents with  . Shortness of Breath    HPI Brooke [name] is a 61 y.o. female.  Pt is a 60y/o female with mmp including asthma, bilateral PE, COPD but continues to smoke, HTN, colon CA s/p surgery and chemo, mild aortic stenosis who presents tonight by EMS for worsening cough since yesterday that is non-productive, wheezing and SOB.  Pt states that she did not use her inhalers today but has been using them otherwise. She complains of chills and was not sure if she had a fever today. She has been at the hospital has had sick contacts but has also received a pneumonia and flu shot. Patient denies vomiting or abdominal pain. No diarrhea. No lower extremity swelling. When EMS arrived patient was satting 86% on room air and they gave nebs and started her on oxygen.   The history is provided by the patient and a relative.  Shortness of Breath  This is a recurrent problem.    Past Medical History:  Diagnosis Date  . Anxiety   . Aortic stenosis    a. mild by echo 05/2015.  . Arthritis   . Asthma   . Bilateral pulmonary embolism ([place]) 02/2014   a. PCCM recommended lifelong anticoagulation.  . Chronic back pain   . Chronic bronchitis   . Chronic pain   . Colon cancer ([place])    a. s/p surgery, chemotherapy.  Marland Kitchen COPD (chronic obstructive pulmonary disease) ([place])   . Depression   . DVT (deep venous thrombosis) ([place]) 02/2014  . Dyslipidemia   . GERD (gastroesophageal reflux disease)   . Hypertension   . Migraine headache   . Morbid obesity ([place])   . Obstructive sleep apnea    mild  . Osteoarthritis   . Ovarian cyst   . PVC's (premature ventricular contractions) 2013  . Stroke ([place])   . Tobacco abuse   . Uterine fibroid     Patient Active Problem List   Diagnosis Date Noted  . Neuropathy ([place]) 10/29/2015  . Seizures ([place]) 06/20/2015    . Expressive aphasia   . 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 Stroke with cerebral ischemia ([place])   . Chronic obstructive pulmonary disease ([place])   . Hx of migraines   . OSA (obstructive sleep apnea)   . Hemiparesis, aphasia, and dysphagia as late effect of cerebrovascular accident (CVA) ([place])   . Elevated troponin 05/28/2015  . Essential hypertension 05/28/2015  . Hyperkalemia 05/28/2015  . History of pulmonary embolism 05/28/2015  . History of DVT (deep vein thrombosis) 05/28/2015  . Global aphasia   . Aphasia   . Weakness   . Stroke ([place]) 05/26/2015  . Acute tonsillitis 06/25/2014  . Respiratory failure ([place]) 02/23/2014  . Asthma, chronic 02/08/2014  . Obstructive sleep apnea 02/08/2014  . GERD (gastroesophageal reflux disease) 09/28/2013  . Chest pain 04/19/2013  . Obesity, Class III, BMI 40-49.9 (morbid obesity) ([place]) 08/23/2012  . Constipation 08/20/2012  . Headache(784.0) 08/20/2012  . Chronic pain syndrome 08/19/2012  . Hyperglycemia 08/19/2012  . Insomnia 08/19/2012  . Hypokalemia 10/21/2011  . Tobacco abuse 10/21/2011  . Arthritis 10/21/2011  . Anxiety 10/21/2011  . Depression 10/21/2011  . Esophageal reflux 07/30/2011  . Diarrhea following gastrointestinal surgery 07/30/2011  . PVC (premature ventricular contraction) 07/10/2011  . Morbid obesity (  [place])   . Hypertension   . Dyslipidemia   . 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 CARPAL TUNNEL RELEASE     rt  . CESAREAN SECTION    . COLONOSCOPY    . KNEE ARTHROSCOPY     bil.  Marland Kitchen MOUTH SURGERY     teeth extraction  . SUBTOTAL COLECTOMY    . TUBAL LIGATION      OB History    Gravida Para Term Preterm AB Living   2 2 2     2    SAB TAB Ectopic Multiple Live Births                   Home Medications    Prior to Admission medications   Medication  Sig Start Date End Date Taking? Authorizing Provider  acetaminophen (TYLENOL) 500 MG tablet Take 500 mg by mouth every 6 (six) hours as needed for headache.    Historical Provider, MD  albuterol (PROVENTIL HFA;VENTOLIN HFA) 108 (90 Base) MCG/ACT inhaler Inhale 2 puffs into the lungs every 6 (six) hours as needed for wheezing or shortness of breath (shortness of breath). 10/29/15   Arnoldo Morale, MD  atorvastatin (LIPITOR) 40 MG tablet TAKE 1 TABLET DAILY AT 6 PM 01/22/16   Arnoldo Morale, MD  azithromycin (ZITHROMAX) 250 MG tablet TAKE 1 TAB DAILY FOR 4 DAYS 11/23/15   Isla Pence, MD  citalopram (CELEXA) 20 MG tablet Take 1 tablet (20 mg total) by mouth daily. 10/29/15   Arnoldo Morale, MD  gabapentin (NEURONTIN) 300 MG capsule TAKE 2 CAPSULES TWICE DAILY 02/26/16   Arnoldo Morale, MD  hydrochlorothiazide (HYDRODIURIL) 25 MG tablet Take 25 mg by mouth daily.    Historical Provider, MD  HYDROcodone-acetaminophen (NORCO/VICODIN) 5-325 MG tablet Take 1 tablet by mouth every 4 (four) hours as needed. 11/23/15   Isla Pence, MD  hydrOXYzine (ATARAX/VISTARIL) 10 MG tablet Take 1 tablet (10 mg total) by mouth 3 (three) times daily as needed. 10/29/15   Arnoldo Morale, MD  levETIRAcetam (KEPPRA) 500 MG tablet TAKE 1 TABLET TWICE DAILY 01/22/16   Arnoldo Morale, MD  lisinopril (PRINIVIL,ZESTRIL) 5 MG tablet Take 1 tablet (5 mg total) by mouth daily. 10/29/15   Arnoldo Morale, MD  loratadine (CLARITIN) 10 MG tablet Take 1 tablet (10 mg total) by mouth daily. 11/23/15   Isla Pence, MD  LORazepam (ATIVAN) 1 MG tablet Take 1 tablet by mouth daily as needed for anxiety.  11/07/15   Historical Provider, MD  omeprazole (PRILOSEC) 40 MG capsule TAKE 1 CAPSULE EVERY DAY 09/17/15   Arnoldo Morale, MD  oxyCODONE-acetaminophen (PERCOCET/ROXICET) 5-325 MG tablet Take 1 tablet by mouth every 6 (six) hours as needed for moderate pain.  11/07/15   Historical Provider, MD  potassium chloride SA (K-DUR,KLOR-CON) 20 MEQ tablet Take 20 mEq by  mouth daily. 09/17/15   Historical Provider, MD  rivaroxaban (XARELTO) 20 MG TABS tablet TAKE ONE TABLET BY MOUTH ONCE DAILY IN THE EVENING WITH SUPPER 10/29/15   Arnoldo Morale, MD    Family History Family History  Problem Relation Age of Onset  . Uterine cancer Mother   . Ovarian cancer Mother   . Stroke Father   . Diabetes Father   . Hypertension Father   . Colon cancer Maternal Uncle   . Breast cancer Maternal Grandmother   . Diabetes Sister   . Asthma Child   .  Asthma Child     Social History Social History  Substance Use Topics  . Smoking status: Current Some Day Smoker    Packs/day: 1.00    Years: 37.00    Types: Cigarettes  . Smokeless tobacco: Never Used     Comment: trying to quit, down to .5ppd X2 years ago.   . Alcohol use No     Allergies   Kiwi extract and Aspirin   Review of Systems Review of Systems  Respiratory: Positive for shortness of breath.   All other systems reviewed and are negative.    Physical Exam Updated Vital Signs BP 101/66   Pulse (!) 35   Temp 100.9 F (38.3 C) (Rectal)   Resp 21   LMP 05/24/2003   SpO2 97%   Physical Exam  Constitutional: She is oriented to person, place, and time. She appears well-developed and well-nourished. No distress.  HENT:  Head: Normocephalic and atraumatic.  Mouth/Throat: Oropharynx is clear and moist. Mucous membranes are dry.  Eyes: Conjunctivae and EOM are normal. Pupils are equal, round, and reactive to light.  Neck: Normal range of motion. Neck supple.  Cardiovascular: Regular rhythm and intact distal pulses.  Tachycardia present.   No murmur heard. Pulmonary/Chest: Effort normal. Tachypnea noted. No respiratory distress. She has wheezes. She has no rales.  Abdominal: Soft. She exhibits no distension. There is no tenderness. There is no rebound and no guarding.  Musculoskeletal: Normal range of motion. She exhibits no edema or tenderness.  Neurological: She is alert and oriented to person,  place, and time.  Some mild aphasia which is present from an old stroke  Skin: Skin is warm and dry. No rash noted. No erythema.  Psychiatric: She has a normal mood and affect. Her behavior is normal.  Nursing note and vitals reviewed.    ED Treatments / Results  Labs (all labs ordered are listed, but only abnormal results are displayed) Labs Reviewed  CBC WITH DIFFERENTIAL/PLATELET - Abnormal; Notable for the following:       Result Value   RBC 3.83 (*)    Platelets 139 (*)    Neutro Abs 8.0 (*)    Lymphs Abs 0.4 (*)    All other components within normal limits  COMPREHENSIVE METABOLIC PANEL - Abnormal; Notable for the following:    Potassium 2.7 (*)    Glucose, Bld 127 (*)    Calcium 8.0 (*)    Total Protein 6.2 (*)    Albumin 3.3 (*)    ALT 8 (*)    All other components within normal limits  I-STAT CG4 LACTIC ACID, ED - Abnormal; Notable for the following:    Lactic Acid, Venous 3.47 (*)    All other components within normal limits  I-STAT CG4 LACTIC ACID, ED - Abnormal; Notable for the following:    Lactic Acid, Venous 3.78 (*)    All other components within normal limits  CULTURE, BLOOD (ROUTINE X 2)  CULTURE, BLOOD (ROUTINE X 2)  INFLUENZA PANEL BY PCR (TYPE A & B)  URINALYSIS, ROUTINE W REFLEX MICROSCOPIC    EKG  EKG Interpretation  Date/Time:  Thursday August 07 2016 19:35:59 EST Ventricular Rate:  109 PR Interval:    QRS Duration: 77 QT Interval:  313 QTC Calculation: 422 R Axis:   144 Text Interpretation:  Right and left arm electrode reversal, interpretation assumes no reversal Sinus tachycardia Consider right atrial enlargement Probable lateral infarct, age indeterminate No significant change since last tracing Confirmed by [facility]  MD, Loree Fee ([id number]) on 08/07/2016 9:38:10 PM       Radiology Dg Chest 2 View  Result Date: 08/07/2016 CLINICAL DATA:  Shortness breath and cough EXAM: CHEST  2 VIEW COMPARISON:  11/22/2015 FINDINGS: Cardiac shadow is  stable. Lungs are well aerated bilaterally. No focal infiltrate or sizable effusion is seen. Degenerative change of the thoracic spine is noted. IMPRESSION: No acute abnormality seen. Electronically Signed   By: Inez Catalina M.D.   On: 08/07/2016 21:20    Procedures Procedures (including critical care time)  Medications Ordered in ED Medications  albuterol (PROVENTIL) (2.5 MG/3ML) 0.083% nebulizer solution 5 mg (5 mg Nebulization Not Given 08/07/16 2102)  cefTRIAXone (ROCEPHIN) 1 g in dextrose 5 % 50 mL IVPB (0 g Intravenous Stopped 08/07/16 2348)  azithromycin (ZITHROMAX) 500 mg in dextrose 5 % 250 mL IVPB (0 mg Intravenous Stopped 08/08/16 0016)  0.9 %  sodium chloride infusion (not administered)  potassium chloride 30 mEq in sodium chloride 0.9 % 265 mL (KCL MULTIRUN) IVPB (not administered)  albuterol (PROVENTIL) (2.5 MG/3ML) 0.083% nebulizer solution 5 mg (5 mg Nebulization Given 08/07/16 2102)  morphine 4 MG/ML injection 4 mg (4 mg Intravenous Given 08/07/16 2121)  ipratropium (ATROVENT) nebulizer solution 0.5 mg (0.5 mg Nebulization Given 08/07/16 2103)  methylPREDNISolone sodium succinate (SOLU-MEDROL) 125 mg/2 mL injection 125 mg (125 mg Intravenous Given 08/07/16 2122)  sodium chloride 0.9 % bolus 1,000 mL (0 mLs Intravenous Stopped 08/07/16 2230)  acetaminophen (TYLENOL) tablet 650 mg (650 mg Oral Given 08/07/16 2153)  sodium chloride 0.9 % bolus 1,000 mL (0 mLs Intravenous Stopped 08/07/16 2336)  albuterol (PROVENTIL) (2.5 MG/3ML) 0.083% nebulizer solution 5 mg (5 mg Nebulization Given 08/07/16 2345)  oxyCODONE-acetaminophen (PERCOCET/ROXICET) 5-325 MG per tablet 1 tablet (1 tablet Oral Given 08/07/16 2314)     Initial Impression / Assessment and Plan / ED Course  I have reviewed the triage vital signs and the nursing notes.  Pertinent labs & imaging results that were available during my care of the patient were reviewed by me and considered in my medical decision making (see chart for details).       Patient is a 61 year old female presenting today with abrupt onset of cough, fever and generalized wheezing with hypoxia. Patient has multiple medical problems including bilateral PEs on Xarelto, asthma and COPD still smoking, prior colon cancer. After receiving nebs by EMS and being placed on 2 L nasal cannula here patient still has diffuse wheezing but is able to speak in short sentences. She was found to be febrile. Given fever, tachycardia, tachypnea sepsis order set initiated. Assumed pathogen was respiratory. Her flu test was negative EKG showed sinus tachycardia without signs of ACS. Patient denies any chest pain consistent with ACS. She has no evidence of cellulitis and denies any urinary symptoms. Initial chest x-ray within normal limits however patient had been covered with Rocephin and azithromycin. It could be early URI exacerbating her COPD. After third neb patient is still wheezing but appears more comfortable. She was given 2 L of IV fluids. Lactate is elevated at 3.5. CBC, CMP pending.  Patient also received Solu-Medrol. 12:54 AM Labs are consistent with a normal white count and hemoglobin. Potassium low at 2.7 which is most likely from recurrent albuterol treatments. Repeat lactate is slightly worsened but may be related to recurrent albuterol treatments. Will admit for further care.   Final Clinical Impressions(s) / ED Diagnoses   Final diagnoses:  COPD exacerbation (HCC)  Fever, unspecified fever cause  New Prescriptions New Prescriptions   No medications on file     Blanchie Dessert, MD 08/08/16 [phone]

## 2016-08-07 NOTE — ED Notes (Signed)
Per EMS- pt c/o SOB - has had a cough for a few days.Wheezing in all fields. 86 on room air

## 2016-08-08 ENCOUNTER — Encounter (HOSPITAL_COMMUNITY): Payer: Self-pay | Admitting: Internal Medicine

## 2016-08-08 DIAGNOSIS — J9600 Acute respiratory failure, unspecified whether with hypoxia or hypercapnia: Secondary | ICD-10-CM | POA: Diagnosis present

## 2016-08-08 DIAGNOSIS — J441 Chronic obstructive pulmonary disease with (acute) exacerbation: Secondary | ICD-10-CM | POA: Diagnosis not present

## 2016-08-08 DIAGNOSIS — R4701 Aphasia: Secondary | ICD-10-CM | POA: Diagnosis not present

## 2016-08-08 DIAGNOSIS — Z6841 Body Mass Index (BMI) 40.0 and over, adult: Secondary | ICD-10-CM | POA: Diagnosis not present

## 2016-08-08 DIAGNOSIS — F17210 Nicotine dependence, cigarettes, uncomplicated: Secondary | ICD-10-CM | POA: Diagnosis present

## 2016-08-08 DIAGNOSIS — R0602 Shortness of breath: Secondary | ICD-10-CM | POA: Diagnosis present

## 2016-08-08 DIAGNOSIS — J9601 Acute respiratory failure with hypoxia: Secondary | ICD-10-CM | POA: Diagnosis not present

## 2016-08-08 DIAGNOSIS — I350 Nonrheumatic aortic (valve) stenosis: Secondary | ICD-10-CM | POA: Diagnosis present

## 2016-08-08 DIAGNOSIS — G4700 Insomnia, unspecified: Secondary | ICD-10-CM | POA: Diagnosis present

## 2016-08-08 DIAGNOSIS — F329 Major depressive disorder, single episode, unspecified: Secondary | ICD-10-CM | POA: Diagnosis present

## 2016-08-08 DIAGNOSIS — M549 Dorsalgia, unspecified: Secondary | ICD-10-CM | POA: Diagnosis present

## 2016-08-08 DIAGNOSIS — G40909 Epilepsy, unspecified, not intractable, without status epilepticus: Secondary | ICD-10-CM | POA: Diagnosis present

## 2016-08-08 DIAGNOSIS — J209 Acute bronchitis, unspecified: Secondary | ICD-10-CM | POA: Diagnosis not present

## 2016-08-08 DIAGNOSIS — I69320 Aphasia following cerebral infarction: Secondary | ICD-10-CM | POA: Diagnosis not present

## 2016-08-08 DIAGNOSIS — K219 Gastro-esophageal reflux disease without esophagitis: Secondary | ICD-10-CM | POA: Diagnosis present

## 2016-08-08 DIAGNOSIS — G4733 Obstructive sleep apnea (adult) (pediatric): Secondary | ICD-10-CM | POA: Diagnosis present

## 2016-08-08 DIAGNOSIS — Z79891 Long term (current) use of opiate analgesic: Secondary | ICD-10-CM | POA: Diagnosis not present

## 2016-08-08 DIAGNOSIS — E876 Hypokalemia: Secondary | ICD-10-CM | POA: Diagnosis present

## 2016-08-08 DIAGNOSIS — E785 Hyperlipidemia, unspecified: Secondary | ICD-10-CM | POA: Diagnosis present

## 2016-08-08 DIAGNOSIS — J440 Chronic obstructive pulmonary disease with acute lower respiratory infection: Secondary | ICD-10-CM | POA: Diagnosis present

## 2016-08-08 DIAGNOSIS — Z7901 Long term (current) use of anticoagulants: Secondary | ICD-10-CM | POA: Diagnosis not present

## 2016-08-08 DIAGNOSIS — T502X5A Adverse effect of carbonic-anhydrase inhibitors, benzothiadiazides and other diuretics, initial encounter: Secondary | ICD-10-CM | POA: Diagnosis present

## 2016-08-08 DIAGNOSIS — F419 Anxiety disorder, unspecified: Secondary | ICD-10-CM | POA: Diagnosis present

## 2016-08-08 DIAGNOSIS — G894 Chronic pain syndrome: Secondary | ICD-10-CM | POA: Diagnosis not present

## 2016-08-08 DIAGNOSIS — Z86718 Personal history of other venous thrombosis and embolism: Secondary | ICD-10-CM | POA: Diagnosis not present

## 2016-08-08 DIAGNOSIS — I10 Essential (primary) hypertension: Secondary | ICD-10-CM | POA: Diagnosis present

## 2016-08-08 LAB — BASIC METABOLIC PANEL
ANION GAP: 8 (ref 5–15)
BUN: 6 mg/dL (ref 6–20)
CO2: 26 mmol/L (ref 22–32)
Calcium: 8.1 mg/dL — ABNORMAL LOW (ref 8.9–10.3)
Chloride: 104 mmol/L (ref 101–111)
Creatinine, Ser: 0.79 mg/dL (ref 0.44–1.00)
GLUCOSE: 152 mg/dL — AB (ref 65–99)
POTASSIUM: 3.1 mmol/L — AB (ref 3.5–5.1)
Sodium: 138 mmol/L (ref 135–145)

## 2016-08-08 LAB — CBC
HEMATOCRIT: 37 % (ref 36.0–46.0)
HEMOGLOBIN: 12.2 g/dL (ref 12.0–15.0)
MCH: 32.1 pg (ref 26.0–34.0)
MCHC: 33 g/dL (ref 30.0–36.0)
MCV: 97.4 fL (ref 78.0–100.0)
Platelets: 118 10*3/uL — ABNORMAL LOW (ref 150–400)
RBC: 3.8 MIL/uL — AB (ref 3.87–5.11)
RDW: 14.4 % (ref 11.5–15.5)
WBC: 7.2 10*3/uL (ref 4.0–10.5)

## 2016-08-08 LAB — COMPREHENSIVE METABOLIC PANEL
ALT: 8 U/L — AB (ref 14–54)
AST: 19 U/L (ref 15–41)
Albumin: 3.3 g/dL — ABNORMAL LOW (ref 3.5–5.0)
Alkaline Phosphatase: 56 U/L (ref 38–126)
Anion gap: 10 (ref 5–15)
BILIRUBIN TOTAL: 0.7 mg/dL (ref 0.3–1.2)
BUN: 6 mg/dL (ref 6–20)
CALCIUM: 8 mg/dL — AB (ref 8.9–10.3)
CO2: 25 mmol/L (ref 22–32)
CREATININE: 0.8 mg/dL (ref 0.44–1.00)
Chloride: 106 mmol/L (ref 101–111)
GFR calc non Af Amer: >60 mL/min (ref >60)
GLUCOSE: 127 mg/dL — AB (ref 65–99)
Potassium: 2.7 mmol/L — CL (ref 3.5–5.1)
Sodium: 141 mmol/L (ref 135–145)
TOTAL PROTEIN: 6.2 g/dL — AB (ref 6.5–8.1)

## 2016-08-08 LAB — URINALYSIS, ROUTINE W REFLEX MICROSCOPIC
Bilirubin Urine: NEGATIVE
GLUCOSE, UA: NEGATIVE mg/dL
HGB URINE DIPSTICK: NEGATIVE
KETONES UR: NEGATIVE mg/dL
Leukocytes, UA: NEGATIVE
Nitrite: NEGATIVE
PROTEIN: NEGATIVE mg/dL
Specific Gravity, Urine: 1.019 (ref 1.005–1.030)
pH: 5 (ref 5.0–8.0)

## 2016-08-08 LAB — I-STAT CG4 LACTIC ACID, ED: Lactic Acid, Venous: 3.78 mmol/L (ref 0.5–1.9)

## 2016-08-08 LAB — LACTIC ACID, PLASMA
LACTIC ACID, VENOUS: 1.9 mmol/L (ref 0.5–1.9)
Lactic Acid, Venous: 1.9 mmol/L (ref 0.5–1.9)

## 2016-08-08 LAB — HIV ANTIBODY (ROUTINE TESTING W REFLEX): HIV SCREEN 4TH GENERATION: NONREACTIVE

## 2016-08-08 MED ORDER — PANTOPRAZOLE SODIUM 40 MG PO TBEC
40.0000 mg | DELAYED_RELEASE_TABLET | Freq: Every day | ORAL | Status: DC
  Administered 2016-08-08 – 2016-08-10 (×3): 40 mg via ORAL
  Filled 2016-08-08 (×3): qty 1

## 2016-08-08 MED ORDER — POTASSIUM CHLORIDE 2 MEQ/ML IV SOLN
30.0000 meq | Freq: Once | INTRAVENOUS | Status: AC
  Administered 2016-08-08: 30 meq via INTRAVENOUS
  Filled 2016-08-08: qty 15

## 2016-08-08 MED ORDER — BUDESONIDE 0.25 MG/2ML IN SUSP
0.2500 mg | Freq: Two times a day (BID) | RESPIRATORY_TRACT | Status: DC
  Administered 2016-08-08 – 2016-08-10 (×4): 0.25 mg via RESPIRATORY_TRACT
  Filled 2016-08-08 (×5): qty 2

## 2016-08-08 MED ORDER — SODIUM CHLORIDE 0.9 % IV SOLN
INTRAVENOUS | Status: DC
  Administered 2016-08-08: 01:00:00 via INTRAVENOUS

## 2016-08-08 MED ORDER — NICOTINE 14 MG/24HR TD PT24
14.0000 mg | MEDICATED_PATCH | Freq: Every day | TRANSDERMAL | Status: DC
  Administered 2016-08-08 – 2016-08-10 (×3): 14 mg via TRANSDERMAL
  Filled 2016-08-08 (×4): qty 1

## 2016-08-08 MED ORDER — POTASSIUM CHLORIDE 10 MEQ/100ML IV SOLN
10.0000 meq | INTRAVENOUS | Status: AC
  Administered 2016-08-08 (×3): 10 meq via INTRAVENOUS
  Filled 2016-08-08 (×3): qty 100

## 2016-08-08 MED ORDER — LISINOPRIL 5 MG PO TABS
5.0000 mg | ORAL_TABLET | Freq: Every day | ORAL | Status: DC
  Administered 2016-08-08: 5 mg via ORAL
  Filled 2016-08-08: qty 1

## 2016-08-08 MED ORDER — GUAIFENESIN ER 600 MG PO TB12
600.0000 mg | ORAL_TABLET | Freq: Two times a day (BID) | ORAL | Status: DC
  Administered 2016-08-08 – 2016-08-10 (×5): 600 mg via ORAL
  Filled 2016-08-08 (×5): qty 1

## 2016-08-08 MED ORDER — ONDANSETRON HCL 4 MG/2ML IJ SOLN: 4.0000 mg | Freq: Four times a day (QID) | INTRAMUSCULAR | Status: DC | PRN

## 2016-08-08 MED ORDER — METHYLPREDNISOLONE SODIUM SUCC 40 MG IJ SOLR
40.0000 mg | Freq: Two times a day (BID) | INTRAMUSCULAR | Status: DC
  Administered 2016-08-08 – 2016-08-10 (×5): 40 mg via INTRAVENOUS
  Filled 2016-08-08 (×5): qty 1

## 2016-08-08 MED ORDER — LEVETIRACETAM 500 MG PO TABS
500.0000 mg | ORAL_TABLET | Freq: Two times a day (BID) | ORAL | Status: DC
  Administered 2016-08-08 – 2016-08-10 (×5): 500 mg via ORAL
  Filled 2016-08-08 (×5): qty 1

## 2016-08-08 MED ORDER — CITALOPRAM HYDROBROMIDE 20 MG PO TABS
20.0000 mg | ORAL_TABLET | Freq: Every day | ORAL | Status: DC
  Administered 2016-08-08 – 2016-08-10 (×3): 20 mg via ORAL
  Filled 2016-08-08 (×4): qty 1

## 2016-08-08 MED ORDER — IPRATROPIUM-ALBUTEROL 0.5-2.5 (3) MG/3ML IN SOLN
3.0000 mL | RESPIRATORY_TRACT | Status: DC | PRN
  Administered 2016-08-08 – 2016-08-09 (×2): 3 mL via RESPIRATORY_TRACT
  Filled 2016-08-08 (×2): qty 3

## 2016-08-08 MED ORDER — IPRATROPIUM-ALBUTEROL 0.5-2.5 (3) MG/3ML IN SOLN
3.0000 mL | Freq: Four times a day (QID) | RESPIRATORY_TRACT | Status: DC
  Administered 2016-08-08 (×3): 3 mL via RESPIRATORY_TRACT
  Filled 2016-08-08 (×4): qty 3

## 2016-08-08 MED ORDER — AZITHROMYCIN 250 MG PO TABS
500.0000 mg | ORAL_TABLET | Freq: Every day | ORAL | Status: DC
Start: 2016-08-08 — End: 2016-08-10
  Administered 2016-08-08 – 2016-08-10 (×3): 500 mg via ORAL
  Filled 2016-08-08 (×3): qty 2

## 2016-08-08 MED ORDER — IPRATROPIUM-ALBUTEROL 0.5-2.5 (3) MG/3ML IN SOLN
3.0000 mL | RESPIRATORY_TRACT | Status: DC
  Administered 2016-08-08: 3 mL via RESPIRATORY_TRACT

## 2016-08-08 MED ORDER — ONDANSETRON HCL 4 MG PO TABS: 4.0000 mg | ORAL_TABLET | Freq: Four times a day (QID) | ORAL | Status: DC | PRN

## 2016-08-08 MED ORDER — RIVAROXABAN 20 MG PO TABS
20.0000 mg | ORAL_TABLET | Freq: Every day | ORAL | Status: DC
  Administered 2016-08-08 – 2016-08-09 (×2): 20 mg via ORAL
  Filled 2016-08-08 (×2): qty 1

## 2016-08-08 MED ORDER — ATORVASTATIN CALCIUM 40 MG PO TABS
40.0000 mg | ORAL_TABLET | Freq: Every day | ORAL | Status: DC
Start: 2016-08-08 — End: 2016-08-10
  Administered 2016-08-08 – 2016-08-09 (×2): 40 mg via ORAL
  Filled 2016-08-08 (×2): qty 1

## 2016-08-08 MED ORDER — POTASSIUM CHLORIDE CRYS ER 20 MEQ PO TBCR
20.0000 meq | EXTENDED_RELEASE_TABLET | Freq: Every day | ORAL | Status: DC
  Administered 2016-08-08 – 2016-08-10 (×3): 20 meq via ORAL
  Filled 2016-08-08 (×3): qty 1

## 2016-08-08 MED ORDER — ACETAMINOPHEN 325 MG PO TABS
650.0000 mg | ORAL_TABLET | Freq: Four times a day (QID) | ORAL | Status: DC | PRN
  Administered 2016-08-09: 650 mg via ORAL
  Filled 2016-08-08: qty 2

## 2016-08-08 MED ORDER — METHYLPREDNISOLONE SODIUM SUCC 40 MG IJ SOLR: 40.0000 mg | Freq: Every day | INTRAMUSCULAR | Status: DC

## 2016-08-08 MED ORDER — GABAPENTIN 300 MG PO CAPS
600.0000 mg | ORAL_CAPSULE | Freq: Two times a day (BID) | ORAL | Status: DC
  Administered 2016-08-08 – 2016-08-10 (×5): 600 mg via ORAL
  Filled 2016-08-08 (×5): qty 2

## 2016-08-08 MED ORDER — SODIUM CHLORIDE 0.9 % IV SOLN: 30.0000 meq | Freq: Once | INTRAVENOUS | Status: DC

## 2016-08-08 MED ORDER — LISINOPRIL 20 MG PO TABS
20.0000 mg | ORAL_TABLET | Freq: Every day | ORAL | Status: DC
  Administered 2016-08-09 – 2016-08-10 (×2): 20 mg via ORAL
  Filled 2016-08-08 (×2): qty 1

## 2016-08-08 MED ORDER — TRAZODONE HCL 100 MG PO TABS
100.0000 mg | ORAL_TABLET | Freq: Every day | ORAL | Status: DC
  Administered 2016-08-08: 100 mg via ORAL
  Filled 2016-08-08: qty 1

## 2016-08-08 MED ORDER — LORAZEPAM 1 MG PO TABS
1.0000 mg | ORAL_TABLET | Freq: Every day | ORAL | Status: DC | PRN
  Administered 2016-08-08 – 2016-08-09 (×2): 1 mg via ORAL
  Filled 2016-08-08 (×2): qty 1

## 2016-08-08 MED ORDER — OXYCODONE-ACETAMINOPHEN 5-325 MG PO TABS
1.0000 | ORAL_TABLET | Freq: Four times a day (QID) | ORAL | Status: DC | PRN
  Administered 2016-08-08 – 2016-08-10 (×9): 1 via ORAL
  Filled 2016-08-08 (×9): qty 1

## 2016-08-08 MED ORDER — ACETAMINOPHEN 650 MG RE SUPP: 650.0000 mg | Freq: Four times a day (QID) | RECTAL | Status: DC | PRN

## 2016-08-08 MED ORDER — HYDROCHLOROTHIAZIDE 25 MG PO TABS: 25.0000 mg | ORAL_TABLET | Freq: Every day | ORAL | Status: DC

## 2016-08-08 NOTE — H&P (Signed)
History and Physical    Brooke [name] [id number] DOB: 09-24-1955 DOA: 08/07/2016  PCP: Guadlupe Spanish, MD  Patient coming from: Home.  Chief Complaint: Shortness of breath.  HPI: Brooke [name] is a 61 y.o. female with history of stroke with expressive aphasia, history of DVT on xarelto, seizure disorder, asthma/COPD with ongoing tobacco abuse presents to the ER because of shortness of breath. Patient states since yesterday morning patient has been having shortness of breath or productive cough. Patient also has subjective feeling of fever and chills.   ED Course: In the ER patient was found to be hypoxic and tachycardic with fever. Lactate was elevated. Chest x-ray and UA was unremarkable. Patient was found to be wheezing bilaterally. Patient was empirically started on antibiotics and steroids and being admitted for respiratory failure secondary to COPD/asthma/bronchitis.  Review of Systems: As per HPI, rest all negative.   Past Medical History:  Diagnosis Date  . Anxiety   . Aortic stenosis    a. mild by echo 05/2015.  . Arthritis   . Asthma   . Bilateral pulmonary embolism ([place]) 02/2014   a. PCCM recommended lifelong anticoagulation.  . Chronic back pain   . Chronic bronchitis   . Chronic pain   . Colon cancer ([place])    a. s/p surgery, chemotherapy.  Marland Kitchen COPD (chronic obstructive pulmonary disease) ([place])   . Depression   . DVT (deep venous thrombosis) ([place]) 02/2014  . Dyslipidemia   . GERD (gastroesophageal reflux disease)   . Hypertension   . Migraine headache   . Morbid obesity ([place])   . Obstructive sleep apnea    mild  . Osteoarthritis   . Ovarian cyst   . PVC's (premature ventricular contractions) 2013  . Stroke ([place])   . Tobacco abuse   . Uterine fibroid     Past Surgical History:  Procedure Laterality Date  . CARPAL TUNNEL RELEASE     rt  . CESAREAN SECTION    . COLONOSCOPY    . KNEE ARTHROSCOPY     bil.  Marland Kitchen MOUTH SURGERY     teeth extraction  .  SUBTOTAL COLECTOMY    . TUBAL LIGATION       reports that she has been smoking Cigarettes.  She has a 37.00 pack-year smoking history. She has never used smokeless tobacco. She reports that she does not drink alcohol or use drugs.  Allergies  Allergen Reactions  . Kiwi Extract Anaphylaxis, Hives and Swelling    Makes her swell all over   . Aspirin Nausea Only    Can take as long as enteric coated    Family History  Problem Relation Age of Onset  . Uterine cancer Mother   . Ovarian cancer Mother   . Stroke Father   . Diabetes Father   . Hypertension Father   . Colon cancer Maternal Uncle   . Breast cancer Maternal Grandmother   . Diabetes Sister   . Asthma Child   . Asthma Child     Prior to Admission medications   Medication Sig Start Date End Date Taking? Authorizing Provider  acetaminophen (TYLENOL) 500 MG tablet Take 500 mg by mouth every 6 (six) hours as needed for headache.   Yes Historical Provider, MD  albuterol (PROVENTIL HFA;VENTOLIN HFA) 108 (90 Base) MCG/ACT inhaler Inhale 2 puffs into the lungs every 6 (six) hours as needed for wheezing or shortness of breath (shortness of breath). 10/29/15  Yes Arnoldo Morale, MD  atorvastatin (LIPITOR)  40 MG tablet TAKE 1 TABLET DAILY AT 6 PM 01/22/16  Yes Arnoldo Morale, MD  citalopram (CELEXA) 20 MG tablet Take 1 tablet (20 mg total) by mouth daily. 10/29/15  Yes Arnoldo Morale, MD  gabapentin (NEURONTIN) 300 MG capsule TAKE 2 CAPSULES TWICE DAILY 02/26/16  Yes Arnoldo Morale, MD  hydrochlorothiazide (HYDRODIURIL) 25 MG tablet Take 25 mg by mouth daily.   Yes Historical Provider, MD  levETIRAcetam (KEPPRA) 500 MG tablet TAKE 1 TABLET TWICE DAILY 01/22/16  Yes Arnoldo Morale, MD  lisinopril (PRINIVIL,ZESTRIL) 5 MG tablet Take 1 tablet (5 mg total) by mouth daily. 10/29/15  Yes Arnoldo Morale, MD  LORazepam (ATIVAN) 1 MG tablet Take 1 tablet by mouth daily as needed for anxiety.  11/07/15  Yes Historical Provider, MD  omeprazole (PRILOSEC) 40 MG  capsule TAKE 1 CAPSULE EVERY DAY 09/17/15  Yes Arnoldo Morale, MD  oxyCODONE-acetaminophen (PERCOCET/ROXICET) 5-325 MG tablet Take 1 tablet by mouth every 6 (six) hours as needed for moderate pain.  11/07/15  Yes Historical Provider, MD  potassium chloride SA (K-DUR,KLOR-CON) 20 MEQ tablet Take 20 mEq by mouth daily. 09/17/15  Yes Historical Provider, MD  rivaroxaban (XARELTO) 20 MG TABS tablet TAKE ONE TABLET BY MOUTH ONCE DAILY IN THE EVENING WITH SUPPER 10/29/15  Yes Arnoldo Morale, MD  azithromycin (ZITHROMAX) 250 MG tablet TAKE 1 TAB DAILY FOR 4 DAYS Patient not taking: Reported on 08/08/2016 11/23/15   Isla Pence, MD  HYDROcodone-acetaminophen (NORCO/VICODIN) 5-325 MG tablet Take 1 tablet by mouth every 4 (four) hours as needed. Patient not taking: Reported on 08/08/2016 11/23/15   Isla Pence, MD  hydrOXYzine (ATARAX/VISTARIL) 10 MG tablet Take 1 tablet (10 mg total) by mouth 3 (three) times daily as needed. Patient not taking: Reported on 08/08/2016 10/29/15   Arnoldo Morale, MD  loratadine (CLARITIN) 10 MG tablet Take 1 tablet (10 mg total) by mouth daily. Patient not taking: Reported on 08/08/2016 11/23/15   Isla Pence, MD    Physical Exam: Vitals:   08/07/16 2213 08/07/16 2345 08/08/16 0047 08/08/16 0326  BP: 101/66  (!) 105/52 100/68  Pulse: (!) 35  94 85  Resp: 21  19   Temp:      TempSrc:      SpO2: 97% 94% 94% 97%  Weight:   98 kg (216 lb)       Constitutional: Moderately built and nourished. Vitals:   08/07/16 2213 08/07/16 2345 08/08/16 0047 08/08/16 0326  BP: 101/66  (!) 105/52 100/68  Pulse: (!) 35  94 85  Resp: 21  19   Temp:      TempSrc:      SpO2: 97% 94% 94% 97%  Weight:   98 kg (216 lb)    Eyes: Anicteric no pallor. ENMT: No discharge from the ears eyes nose and mouth. Neck: No mass felt. No JVD appreciated. Respiratory: Bilateral expiratory wheezes no palpitations. Cardiovascular: S1-S2 heard no murmurs appreciated. Abdomen: Soft nontender bowel sounds  present. Musculoskeletal: No edema. No joint effusion. Skin: No rash. Skin appears warm. Neurologic: Alert awake oriented to time place and person. Moves all extremities. Psychiatric: Appears normal. Normal affect.   Labs on Admission: I have personally reviewed following labs and imaging studies  CBC:  Recent Labs Lab 08/07/16 2321  WBC 8.6  NEUTROABS 8.0*  HGB 12.3  HCT 36.9  MCV 96.3  PLT [id number]*   Basic Metabolic Panel:  Recent Labs Lab 08/07/16 2359  NA 141  K 2.7*  CL 106  CO2  25  GLUCOSE 127*  BUN 6  CREATININE 0.80  CALCIUM 8.0*   GFR: Estimated Creatinine Clearance: 84.7 mL/min (by C-G formula based on SCr of 0.8 mg/dL). Liver Function Tests:  Recent Labs Lab 08/07/16 2359  AST 19  ALT 8*  ALKPHOS 56  BILITOT 0.7  PROT 6.2*  ALBUMIN 3.3*   No results for input(s): LIPASE, AMYLASE in the last 168 hours. No results for input(s): AMMONIA in the last 168 hours. Coagulation Profile: No results for input(s): INR, PROTIME in the last 168 hours. Cardiac Enzymes: No results for input(s): CKTOTAL, CKMB, CKMBINDEX, TROPONINI in the last 168 hours. BNP (last 3 results) No results for input(s): PROBNP in the last 8760 hours. HbA1C: No results for input(s): HGBA1C in the last 72 hours. CBG: No results for input(s): GLUCAP in the last 168 hours. Lipid Profile: No results for input(s): CHOL, HDL, LDLCALC, TRIG, CHOLHDL, LDLDIRECT in the last 72 hours. Thyroid Function Tests: No results for input(s): TSH, T4TOTAL, FREET4, T3FREE, THYROIDAB in the last 72 hours. Anemia Panel: No results for input(s): VITAMINB12, FOLATE, FERRITIN, TIBC, IRON, RETICCTPCT in the last 72 hours. Urine analysis:    Component Value Date/Time   COLORURINE YELLOW 08/07/2016 0225   APPEARANCEUR CLEAR 08/07/2016 0225   LABSPEC 1.019 08/07/2016 0225   PHURINE 5.0 08/07/2016 0225   GLUCOSEU NEGATIVE 08/07/2016 0225   HGBUR NEGATIVE 08/07/2016 0225   BILIRUBINUR NEGATIVE 08/07/2016  0225   KETONESUR NEGATIVE 08/07/2016 0225   PROTEINUR NEGATIVE 08/07/2016 0225   UROBILINOGEN 1.0 06/15/2014 1018   NITRITE NEGATIVE 08/07/2016 0225   LEUKOCYTESUR NEGATIVE 08/07/2016 0225   Sepsis Labs: @LABRCNTIP (procalcitonin:4,lacticidven:4) )No results found for this or any previous visit (from the past 240 hour(s)).   Radiological Exams on Admission: Dg Chest 2 View  Result Date: 08/07/2016 CLINICAL DATA:  Shortness breath and cough EXAM: CHEST  2 VIEW COMPARISON:  11/22/2015 FINDINGS: Cardiac shadow is stable. Lungs are well aerated bilaterally. No focal infiltrate or sizable effusion is seen. Degenerative change of the thoracic spine is noted. IMPRESSION: No acute abnormality seen. Electronically Signed   By: Inez Catalina M.D.   On: 08/07/2016 21:20    EKG: Independently reviewed. Sinus tachycardia.  Assessment/Plan Active Problems:   Tobacco abuse   Chronic pain syndrome   History of DVT (deep vein thrombosis)   Stroke with cerebral ischemia (HCC)   OSA (obstructive sleep apnea)   Expressive aphasia   Seizures (HCC)   Acute respiratory failure (HCC)   Acute bronchitis    1. Acute respiratory failure with hypoxia likely secondary to COPD exacerbation/bronchitis - patient has been placed on nebulizer treatment and Pulmicort and IV steroids. Continue with antibiotics. 2. Hypokalemia likely secondary to hydrochlorothiazide - replace potassium and recheck. Holding off hydrochlorothiazide until then. 3. Hypertension - holding off hydrochlorothiazide due to hypokalemia. Patient is on lisinopril. Closely follow blood pressure trends. 4. History of DVT on xarelto which will be continued. 5. History of stroke on xarelto and statins. 6. History of seizures on Keppra. 7. Chronic back pain on Percocet.   DVT prophylaxis: Xarelto. Code Status: Full code.  Family Communication: Discussed with patient.  Disposition Plan: Home.  Consults called: None.  Admission status:  Inpatient.    Rise Patience MD [facility] Pager [phone].  If 7PM-7AM, please contact night-coverage [email or web address] Password [email or web address]  08/08/2016, 4:10 AM

## 2016-08-08 NOTE — Progress Notes (Signed)
Patient seen and evaluated, chart reviewed, please see EMR for updated orders. Please see full H&P dictated by admitting physician for same date of service.    Morbidly obese female with ongoing tobacco use disorder admitted with COPD exacerbation  1) acute exacerbation of COPD- O2 sats 93-94% on room air, continue bronchodilators  2)Hypokalemia-replace and recheck  3)HTN-stable continue lisinopril  Patient seen and evaluated, chart reviewed, please see EMR for updated orders. Please see full H&P dictated by admitting physician for same date of service.

## 2016-08-08 NOTE — ED Notes (Signed)
K 2.7

## 2016-08-09 DIAGNOSIS — R4701 Aphasia: Secondary | ICD-10-CM

## 2016-08-09 DIAGNOSIS — J209 Acute bronchitis, unspecified: Secondary | ICD-10-CM

## 2016-08-09 DIAGNOSIS — G894 Chronic pain syndrome: Secondary | ICD-10-CM

## 2016-08-09 DIAGNOSIS — J9601 Acute respiratory failure with hypoxia: Secondary | ICD-10-CM

## 2016-08-09 LAB — BASIC METABOLIC PANEL
ANION GAP: 7 (ref 5–15)
BUN: 9 mg/dL (ref 6–20)
CALCIUM: 8.6 mg/dL — AB (ref 8.9–10.3)
CO2: 26 mmol/L (ref 22–32)
Chloride: 105 mmol/L (ref 101–111)
Creatinine, Ser: 0.69 mg/dL (ref 0.44–1.00)
Glucose, Bld: 166 mg/dL — ABNORMAL HIGH (ref 65–99)
Potassium: 4 mmol/L (ref 3.5–5.1)
Sodium: 138 mmol/L (ref 135–145)

## 2016-08-09 MED ORDER — IPRATROPIUM-ALBUTEROL 0.5-2.5 (3) MG/3ML IN SOLN
3.0000 mL | RESPIRATORY_TRACT | Status: DC
  Administered 2016-08-09 (×4): 3 mL via RESPIRATORY_TRACT
  Filled 2016-08-09 (×5): qty 3

## 2016-08-09 MED ORDER — TRAZODONE HCL 50 MG PO TABS
150.0000 mg | ORAL_TABLET | Freq: Every day | ORAL | Status: DC
  Administered 2016-08-09: 20:00:00 150 mg via ORAL
  Filled 2016-08-09: qty 1

## 2016-08-09 MED ORDER — DIPHENHYDRAMINE HCL 50 MG/ML IJ SOLN
50.0000 mg | Freq: Once | INTRAMUSCULAR | Status: AC
  Administered 2016-08-09: 50 mg via INTRAVENOUS
  Filled 2016-08-09: qty 1

## 2016-08-09 MED ORDER — IPRATROPIUM-ALBUTEROL 0.5-2.5 (3) MG/3ML IN SOLN: 3.0000 mL | Freq: Four times a day (QID) | RESPIRATORY_TRACT | Status: DC | PRN

## 2016-08-09 MED ORDER — HYDROXYZINE HCL 25 MG PO TABS
25.0000 mg | ORAL_TABLET | Freq: Three times a day (TID) | ORAL | Status: DC | PRN
  Administered 2016-08-09 – 2016-08-10 (×2): 25 mg via ORAL
  Filled 2016-08-09 (×2): qty 1

## 2016-08-09 NOTE — Progress Notes (Signed)
Patient Demographics:    Brooke [name], is a 61 y.o. female, DOB - 1956/03/05, [id number]  Admit date - 08/07/2016   Admitting Physician Rise Patience, MD  Outpatient Primary MD for the patient is Guadlupe Spanish, MD  LOS - 1   Chief Complaint  Patient presents with  . Shortness of Breath        Subjective:    Brooke [name] today has no fevers, no emesis,  No chest pain,  C/o knee pain, spoke with her daughter Ms Donney Dice and her son at her request, questions answered   Assessment  & Plan :    Active Problems:   Tobacco abuse   Chronic pain syndrome   History of DVT (deep vein thrombosis)   Stroke with cerebral ischemia (HCC)   OSA (obstructive sleep apnea)   Expressive aphasia   Seizures (HCC)   Acute respiratory failure (HCC)   Acute bronchitis  1)Acute exacerbation of COPD- Overall improving, hypoxia has resolved, O2 sats 93-94% on room air, continue bronchodilators, solu medrol and Azithro  2)Hypokalemia-replace and recheck  3)HTN-stable continue lisinopril  4)Chronic pain syndrome-patient continues to request additional narcotics, continue oxycodone when necessary, continue gabapentin scheduled  5)Anxiety/Insomnia- increase trazodone to 150 mg daily at bedtime, give Benadryl, use Atarax when necessary  6)H/o CVA and DVT/PE- c/n Xarelto  Code Status :  Full  Disposition Plan  : Home  DVT Prophylaxis  :  Xarelto   Lab Results  Component Value Date   PLT 118 (L) 08/08/2016    Inpatient Medications  Scheduled Meds: . atorvastatin  40 mg Oral q1800  . azithromycin  500 mg Oral Daily  . budesonide (PULMICORT) nebulizer solution  0.25 mg Nebulization BID  . citalopram  20 mg Oral Daily  . diphenhydrAMINE  50 mg Intravenous Once  . gabapentin  600 mg Oral BID  . guaiFENesin  600 mg Oral BID  . ipratropium-albuterol  3 mL Nebulization Q4H  . levETIRAcetam  500 mg  Oral BID  . lisinopril  20 mg Oral Daily  . methylPREDNISolone (SOLU-MEDROL) injection  40 mg Intravenous Q12H  . nicotine  14 mg Transdermal Daily  . pantoprazole  40 mg Oral Daily  . potassium chloride SA  20 mEq Oral Daily  . rivaroxaban  20 mg Oral Q supper  . traZODone  150 mg Oral QHS   Continuous Infusions: PRN Meds:.acetaminophen **OR** acetaminophen, hydrOXYzine, ipratropium-albuterol, LORazepam, ondansetron **OR** ondansetron (ZOFRAN) IV, oxyCODONE-acetaminophen    Anti-infectives    Start     Dose/Rate Route Frequency Ordered Stop   08/08/16 1000  azithromycin (ZITHROMAX) tablet 500 mg     500 mg Oral Daily 08/08/16 0815 08/12/16 0959   08/07/16 2300  cefTRIAXone (ROCEPHIN) 1 g in dextrose 5 % 50 mL IVPB  Status:  Discontinued     1 g 100 mL/hr over 30 Minutes Intravenous Every 24 hours 08/07/16 2259 08/08/16 0815   08/07/16 2300  azithromycin (ZITHROMAX) 500 mg in dextrose 5 % 250 mL IVPB  Status:  Discontinued     500 mg 250 mL/hr over 60 Minutes Intravenous Every 24 hours 08/07/16 2259 08/08/16 0815        Objective:   Vitals:   08/09/16 [id number] 08/09/16 0532 08/09/16 [id number]  08/09/16 1305  BP: 104/62   121/72  Pulse: 64   79  Resp: 18   20  Temp: 97.5 F (36.4 C)   98.7 F (37.1 C)  TempSrc: Oral   Oral  SpO2: 99%  95% 100%  Weight:  102 kg (224 lb 13.9 oz)    Height:  4\' 10"  (1.473 m)      Wt Readings from Last 3 Encounters:  08/09/16 102 kg (224 lb 13.9 oz)  05/09/16 99.2 kg (218 lb 9.6 oz)  10/29/15 108.4 kg (239 lb)     Intake/Output Summary (Last 24 hours) at 08/09/16 1701 Last data filed at 08/09/16 1320  Gross per 24 hour  Intake             1080 ml  Output                0 ml  Net             1080 ml     Physical Exam  Gen:- Awake Alert,  Unhappy, requesting more narcotics HEENT:- [place].AT, No sclera icterus Neck-Supple Neck,No JVD,.  Lungs- scattered wheezes bilaterally and movement is fair and symmetrical CV- S1, S2 normal Abd-  +ve  B.Sounds, Abd Soft, No tenderness,    Extremity/Skin:- No  edema,       Data Review:   Micro Results Recent Results (from the past 240 hour(s))  Blood Culture (routine x 2)     Status: None (Preliminary result)   Collection Time: 08/07/16  9:45 PM  Result Value Ref Range Status   Specimen Description BLOOD LEFT FOREARM  Final   Special Requests BOTTLES DRAWN AEROBIC AND ANAEROBIC 5 CC  Final   Culture   Final    NO GROWTH 1 DAY Performed at [facility], [place] [address], [place], [place] [address]    Report Status PENDING  Incomplete  Blood Culture (routine x 2)     Status: None (Preliminary result)   Collection Time: 08/07/16 10:01 PM  Result Value Ref Range Status   Specimen Description BLOOD LEFT HAND  Final   Special Requests BOTTLES DRAWN AEROBIC AND ANAEROBIC 5 CC  Final   Culture   Final    NO GROWTH 1 DAY Performed at [facility], [place] [address], [place], [place] [address]    Report Status PENDING  Incomplete    Radiology Reports Dg Chest 2 View  Result Date: 08/07/2016 CLINICAL DATA:  Shortness breath and cough EXAM: CHEST  2 VIEW COMPARISON:  11/22/2015 FINDINGS: Cardiac shadow is stable. Lungs are well aerated bilaterally. No focal infiltrate or sizable effusion is seen. Degenerative change of the thoracic spine is noted. IMPRESSION: No acute abnormality seen. Electronically Signed   By: Inez Catalina M.D.   On: 08/07/2016 21:20     CBC  Recent Labs Lab 08/07/16 2321 08/08/16 0646  WBC 8.6 7.2  HGB 12.3 12.2  HCT 36.9 37.0  PLT 139* 118*  MCV 96.3 97.4  MCH 32.1 32.1  MCHC 33.3 33.0  RDW 14.3 14.4  LYMPHSABS 0.4*  --   MONOABS 0.1  --   EOSABS 0.0  --   BASOSABS 0.0  --     Chemistries   Recent Labs Lab 08/07/16 2359 08/08/16 0646 08/09/16 0545  NA 141 138 138  K 2.7* 3.1* 4.0  CL 106 104 105  CO2 25 26 26   GLUCOSE 127* 152* 166*  BUN 6 6 9   CREATININE 0.80 0.79  0.69  CALCIUM 8.0* 8.1* 8.6*  AST 19  --   --   ALT 8*  --   --    ALKPHOS 56  --   --   BILITOT 0.7  --   --    ------------------------------------------------------------------------------------------------------------------ No results for input(s): CHOL, HDL, LDLCALC, TRIG, CHOLHDL, LDLDIRECT in the last 72 hours.  Lab Results  Component Value Date   HGBA1C 5.6 05/26/2015   ------------------------------------------------------------------------------------------------------------------ No results for input(s): TSH, T4TOTAL, T3FREE, THYROIDAB in the last 72 hours.  Invalid input(s): FREET3 ------------------------------------------------------------------------------------------------------------------ No results for input(s): VITAMINB12, FOLATE, FERRITIN, TIBC, IRON, RETICCTPCT in the last 72 hours.  Coagulation profile No results for input(s): INR, PROTIME in the last 168 hours.  No results for input(s): DDIMER in the last 72 hours.  Cardiac Enzymes No results for input(s): CKMB, TROPONINI, MYOGLOBIN in the last 168 hours.  Invalid input(s): CK ------------------------------------------------------------------------------------------------------------------    Component Value Date/Time   BNP 11.9 10/15/2015 2343     Dahna Hattabaugh M.D on 08/09/2016 at 5:01 PM  Between 7am to 7pm - Pager - [phone]  After 7pm go to [email or web address] - password [email or web address]  [facility] -  Office  [phone]  Dragon dictation system was used to create this note, attempts have been made to correct errors, however presence of uncorrected errors is not a reflection quality of care provided

## 2016-08-10 DIAGNOSIS — J441 Chronic obstructive pulmonary disease with (acute) exacerbation: Secondary | ICD-10-CM | POA: Diagnosis present

## 2016-08-10 LAB — CBC
HCT: 36.1 % (ref 36.0–46.0)
HEMOGLOBIN: 11.6 g/dL — AB (ref 12.0–15.0)
MCH: 32.4 pg (ref 26.0–34.0)
MCHC: 32.1 g/dL (ref 30.0–36.0)
MCV: 100.8 fL — AB (ref 78.0–100.0)
Platelets: 129 10*3/uL — ABNORMAL LOW (ref 150–400)
RBC: 3.58 MIL/uL — ABNORMAL LOW (ref 3.87–5.11)
RDW: 15 % (ref 11.5–15.5)
WBC: 9.5 10*3/uL (ref 4.0–10.5)

## 2016-08-10 MED ORDER — IPRATROPIUM-ALBUTEROL 0.5-2.5 (3) MG/3ML IN SOLN
3.0000 mL | Freq: Four times a day (QID) | RESPIRATORY_TRACT | Status: DC
  Administered 2016-08-10 (×2): 3 mL via RESPIRATORY_TRACT
  Filled 2016-08-10 (×2): qty 3

## 2016-08-10 MED ORDER — PREDNISONE 20 MG PO TABS: 20.0000 mg | ORAL_TABLET | Freq: Every day | ORAL | 0 refills | Status: DC

## 2016-08-10 MED ORDER — GUAIFENESIN ER 600 MG PO TB12
600.0000 mg | ORAL_TABLET | Freq: Two times a day (BID) | ORAL | 0 refills | Status: DC
Start: 2016-08-10 — End: 2017-11-10

## 2016-08-10 MED ORDER — TRAZODONE HCL 150 MG PO TABS: 150.0000 mg | ORAL_TABLET | Freq: Every day | ORAL | 1 refills | Status: DC

## 2016-08-10 MED ORDER — NICOTINE 14 MG/24HR TD PT24: 14.0000 mg | MEDICATED_PATCH | Freq: Every day | TRANSDERMAL | 0 refills | Status: DC

## 2016-08-10 MED ORDER — PREDNISONE 50 MG PO TABS
50.0000 mg | ORAL_TABLET | Freq: Once | ORAL | Status: AC
  Administered 2016-08-10: 50 mg via ORAL
  Filled 2016-08-10: qty 1

## 2016-08-10 MED ORDER — LISINOPRIL 10 MG PO TABS: 10.0000 mg | ORAL_TABLET | Freq: Every day | ORAL | 1 refills | Status: DC

## 2016-08-10 NOTE — Progress Notes (Signed)
Patient ambulated in hall on room air.  Sats 97% ambulating 200 feet in hall.  Returned to room.  No s/s of distress.

## 2016-08-10 NOTE — Progress Notes (Signed)
Patient discharged home.  Leaving with personal belongings and prescriptions.  Reports understanding of discharge instructions.  Room air, no s/s of distress.  Accompanied by daughter.  No complaints.

## 2016-08-10 NOTE — Discharge Summary (Signed)
Brooke [name], is a 61 y.o. female  DOB September 26, 1955  MRN [id number].  Admission date:  08/07/2016  Admitting Physician  Rise Patience, MD  Discharge Date:  08/10/2016   Primary MD  Guadlupe Spanish, MD  Recommendations for primary care physician for things to follow:   Admission Diagnosis  Hypokalemia [E87.6] COPD exacerbation ([place]) [J44.1] Fever, unspecified fever cause [R50.9] Acute bronchitis [J20.9]   Discharge Diagnosis  Hypokalemia [E87.6] COPD exacerbation ([place]) [J44.1] Fever, unspecified fever cause [R50.9] Acute bronchitis [J20.9]    Principal Problem:   COPD exacerbation ([place]) Active Problems:   Tobacco abuse   Chronic pain syndrome   History of DVT (deep vein thrombosis)   Stroke with cerebral ischemia (HCC)   OSA (obstructive sleep apnea)   Expressive aphasia   Seizures (HCC)   Acute respiratory failure (HCC)   Acute bronchitis      Past Medical History:  Diagnosis Date  . Anxiety   . Aortic stenosis    a. mild by echo 05/2015.  . Arthritis   . Asthma   . Bilateral pulmonary embolism ([place]) 02/2014   a. PCCM recommended lifelong anticoagulation.  . Chronic back pain   . Chronic bronchitis   . Chronic pain   . Colon cancer ([place])    a. s/p surgery, chemotherapy.  Marland Kitchen COPD (chronic obstructive pulmonary disease) ([place])   . Depression   . DVT (deep venous thrombosis) ([place]) 02/2014  . Dyslipidemia   . GERD (gastroesophageal reflux disease)   . Hypertension   . Migraine headache   . Morbid obesity ([place])   . Obstructive sleep apnea    mild  . Osteoarthritis   . Ovarian cyst   . PVC's (premature ventricular contractions) 2013  . Stroke ([place])   . Tobacco abuse   . Uterine fibroid     Past Surgical History:  Procedure Laterality Date  . CARPAL TUNNEL RELEASE     rt  . CESAREAN SECTION    . COLONOSCOPY    . KNEE ARTHROSCOPY     bil.  Marland Kitchen MOUTH SURGERY     teeth  extraction  . SUBTOTAL COLECTOMY    . TUBAL LIGATION         HPI  from the history and physical done on the day of admission:    Chief Complaint: Shortness of breath.  HPI: Brooke [name] is a 61 y.o. female with history of stroke with expressive aphasia, history of DVT on xarelto, seizure disorder, asthma/COPD with ongoing tobacco abuse presents to the ER because of shortness of breath. Patient states since yesterday morning patient has been having shortness of breath or productive cough. Patient also has subjective feeling of fever and chills.   ED Course: In the ER patient was found to be hypoxic and tachycardic with fever. Lactate was elevated. Chest x-ray and UA was unremarkable. Patient was found to be wheezing bilaterally. Patient was empirically started on antibiotics and steroids and being admitted for respiratory failure secondary to COPD/asthma/bronchitis.  Review of Systems:  As per HPI, rest all negative     Hospital Course:     1)Acute Exacerbation of COPD- Overall improved,  hypoxia has resolved, Post ambulation O2 sats today is 97% on room air. continue bronchodilators, treated with Solu-Medrol and azithromycin,, ok to d/c home on oral prednisone and azithromycin  2)HTN-stable,  D/c on lisinopril 10 mg daily  4)Chronic pain syndrome-patient continues to request additional narcotics,  continue gabapentin scheduled, resume her home narcotic regimen  5)Anxiety/Insomnia- discharged on trazodone 150 mg daily at bedtime,   6)H/o CVA and DVT/PE-stable, c/n Xarelto    Discharge Condition: Much improved  Follow UP-with PCP  Diet and Activity recommendation:  As advised  Discharge Instructions     Discharge Instructions    Call MD for:  difficulty breathing, headache or visual disturbances    Complete by:  As directed    Call MD for:  persistant dizziness or light-headedness    Complete by:  As directed    Call MD for:  persistant nausea and vomiting     Complete by:  As directed    Call MD for:  temperature >100.4    Complete by:  As directed    Diet - low sodium heart healthy    Complete by:  As directed    Discharge instructions    Complete by:  As directed    1)Take medications as prescribed 2)Avoid Tobacco smoke 3)Quit Smoking !!!   Increase activity slowly    Complete by:  As directed         Discharge Medications     Allergies as of 08/10/2016      Reactions   Kiwi Extract Anaphylaxis, Hives, Swelling   Makes her swell all over    Aspirin Nausea Only   Can take as long as enteric coated      Medication List    STOP taking these medications   oxyCODONE-acetaminophen 5-325 MG tablet Commonly known as:  PERCOCET/ROXICET     TAKE these medications   acetaminophen 500 MG tablet Commonly known as:  TYLENOL Take 500 mg by mouth every 6 (six) hours as needed for headache.   albuterol 108 (90 Base) MCG/ACT inhaler Commonly known as:  PROVENTIL HFA;VENTOLIN HFA Inhale 2 puffs into the lungs every 6 (six) hours as needed for wheezing or shortness of breath (shortness of breath).   atorvastatin 40 MG tablet Commonly known as:  LIPITOR TAKE 1 TABLET DAILY AT 6 PM   azithromycin 250 MG tablet Commonly known as:  ZITHROMAX TAKE 1 TAB DAILY FOR 4 DAYS   citalopram 20 MG tablet Commonly known as:  CELEXA Take 1 tablet (20 mg total) by mouth daily.   gabapentin 300 MG capsule Commonly known as:  NEURONTIN TAKE 2 CAPSULES TWICE DAILY   guaiFENesin 600 MG 12 hr tablet Commonly known as:  MUCINEX Take 1 tablet (600 mg total) by mouth 2 (two) times daily.   hydrochlorothiazide 25 MG tablet Commonly known as:  HYDRODIURIL Take 25 mg by mouth daily.   HYDROcodone-acetaminophen 5-325 MG tablet Commonly known as:  NORCO/VICODIN Take 1 tablet by mouth every 4 (four) hours as needed.   hydrOXYzine 10 MG tablet Commonly known as:  ATARAX/VISTARIL Take 1 tablet (10 mg total) by mouth 3 (three) times daily as needed.    levETIRAcetam 500 MG tablet Commonly known as:  KEPPRA TAKE 1 TABLET TWICE DAILY   lisinopril 10 MG tablet Commonly known as:  PRINIVIL,ZESTRIL Take 1 tablet (10 mg total) by mouth  daily. What changed:  medication strength  how much to take   loratadine 10 MG tablet Commonly known as:  CLARITIN Take 1 tablet (10 mg total) by mouth daily.   LORazepam 1 MG tablet Commonly known as:  ATIVAN Take 1 tablet by mouth daily as needed for anxiety.   nicotine 14 mg/24hr patch Commonly known as:  NICODERM CQ - dosed in mg/24 hours Place 1 patch (14 mg total) onto the skin daily. Start taking on:  08/11/2016   omeprazole 40 MG capsule Commonly known as:  PRILOSEC TAKE 1 CAPSULE EVERY DAY   potassium chloride SA 20 MEQ tablet Commonly known as:  K-DUR,KLOR-CON Take 20 mEq by mouth daily.   predniSONE 20 MG tablet Commonly known as:  DELTASONE Take 1 tablet (20 mg total) by mouth daily with breakfast.   rivaroxaban 20 MG Tabs tablet Commonly known as:  XARELTO TAKE ONE TABLET BY MOUTH ONCE DAILY IN THE EVENING WITH SUPPER   traZODone 150 MG tablet Commonly known as:  DESYREL Take 1 tablet (150 mg total) by mouth at bedtime.       Major procedures and Radiology Reports - PLEASE review detailed and final reports for all details, in brief -   Dg Chest 2 View  Result Date: 08/07/2016 CLINICAL DATA:  Shortness breath and cough EXAM: CHEST  2 VIEW COMPARISON:  11/22/2015 FINDINGS: Cardiac shadow is stable. Lungs are well aerated bilaterally. No focal infiltrate or sizable effusion is seen. Degenerative change of the thoracic spine is noted. IMPRESSION: No acute abnormality seen. Electronically Signed   By: Inez Catalina M.D.   On: 08/07/2016 21:20    Micro Results    Recent Results (from the past 240 hour(s))  Blood Culture (routine x 2)     Status: None (Preliminary result)   Collection Time: 08/07/16  9:45 PM  Result Value Ref Range Status   Specimen Description BLOOD LEFT  FOREARM  Final   Special Requests BOTTLES DRAWN AEROBIC AND ANAEROBIC 5 CC  Final   Culture   Final    NO GROWTH 2 DAYS Performed at [facility], [place] [address] [place], [place] [address]    Report Status PENDING  Incomplete  Blood Culture (routine x 2)     Status: None (Preliminary result)   Collection Time: 08/07/16 10:01 PM  Result Value Ref Range Status   Specimen Description BLOOD LEFT HAND  Final   Special Requests BOTTLES DRAWN AEROBIC AND ANAEROBIC 5 CC  Final   Culture   Final    NO GROWTH 2 DAYS Performed at [facility], [place] [address], [place], [place] [address]    Report Status PENDING  Incomplete   Today   Subjective    Brooke [name] today has no fevers, no n/v/d          Patient has been seen and examined prior to discharge   Objective   Blood pressure 140/88, pulse 74, temperature 98.4 F (36.9 C), temperature source Oral, resp. rate 19, height 4\' 10"  (1.473 m), weight 102 kg (224 lb 13.9 oz), last menstrual period 05/24/2003, SpO2 95 %.   Intake/Output Summary (Last 24 hours) at 08/10/16 1425 Last data filed at 08/10/16 0855  Gross per 24 hour  Intake              720 ml  Output                0 ml  Net  720 ml    Exam Gen:- Awake  In no apparent distress  HEENT:- [place].AT,   Neck-Supple Neck,No JVD,  Lungs- mostly clear , air movement is fair and symmetrical CV- S1, S2 normal Abd-  +ve B.Sounds, Abd Soft, No tenderness,    Extremity/Skin:- Intact peripheral pulses     Data Review   CBC w Diff: Lab Results  Component Value Date   WBC 9.5 08/10/2016   HGB 11.6 (L) 08/10/2016   HCT 36.1 08/10/2016   PLT 129 (L) 08/10/2016   LYMPHOPCT 5 08/07/2016   MONOPCT 1 08/07/2016   EOSPCT 0 08/07/2016   BASOPCT 0 08/07/2016    CMP: Lab Results  Component Value Date   NA 138 08/09/2016   K 4.0 08/09/2016   CL 105 08/09/2016   CO2 26 08/09/2016   BUN 9 08/09/2016   CREATININE 0.69 08/09/2016   CREATININE 0.72 06/13/2014     PROT 6.2 (L) 08/07/2016   ALBUMIN 3.3 (L) 08/07/2016   BILITOT 0.7 08/07/2016   ALKPHOS 56 08/07/2016   AST 19 08/07/2016   ALT 8 (L) 08/07/2016  .   Total Discharge time is about 33 minutes  Chirag Krueger M.D on 08/10/2016 at 2:25 PM  [facility]   Office  [phone]  Dragon dictation system was used to create this note, attempts have been made to correct errors, however presence of uncorrected errors is not a reflection quality of care provided

## 2016-08-13 LAB — CULTURE, BLOOD (ROUTINE X 2)
CULTURE: NO GROWTH
Culture: NO GROWTH

## 2016-09-08 ENCOUNTER — Emergency Department (HOSPITAL_BASED_OUTPATIENT_CLINIC_OR_DEPARTMENT_OTHER)
Admission: EM | Admit: 2016-09-08 | Discharge: 2016-09-09 | Disposition: A | Discharge status: Home / Self Care | Payer: Medicare HMO | Attending: Emergency Medicine | Admitting: Emergency Medicine

## 2016-09-08 ENCOUNTER — Encounter (HOSPITAL_BASED_OUTPATIENT_CLINIC_OR_DEPARTMENT_OTHER): Payer: Self-pay | Admitting: Emergency Medicine

## 2016-09-08 DIAGNOSIS — W1839XA Other fall on same level, initial encounter: Secondary | ICD-10-CM | POA: Insufficient documentation

## 2016-09-08 DIAGNOSIS — Y999 Unspecified external cause status: Secondary | ICD-10-CM | POA: Insufficient documentation

## 2016-09-08 DIAGNOSIS — M25512 Pain in left shoulder: Secondary | ICD-10-CM | POA: Diagnosis not present

## 2016-09-08 DIAGNOSIS — Z85038 Personal history of other malignant neoplasm of large intestine: Secondary | ICD-10-CM | POA: Insufficient documentation

## 2016-09-08 DIAGNOSIS — Y939 Activity, unspecified: Secondary | ICD-10-CM | POA: Diagnosis not present

## 2016-09-08 DIAGNOSIS — Y929 Unspecified place or not applicable: Secondary | ICD-10-CM | POA: Diagnosis not present

## 2016-09-08 DIAGNOSIS — M545 Low back pain, unspecified: Secondary | ICD-10-CM

## 2016-09-08 DIAGNOSIS — F17210 Nicotine dependence, cigarettes, uncomplicated: Secondary | ICD-10-CM | POA: Diagnosis not present

## 2016-09-08 DIAGNOSIS — J449 Chronic obstructive pulmonary disease, unspecified: Secondary | ICD-10-CM | POA: Insufficient documentation

## 2016-09-08 DIAGNOSIS — I10 Essential (primary) hypertension: Secondary | ICD-10-CM | POA: Diagnosis not present

## 2016-09-08 DIAGNOSIS — S0990XA Unspecified injury of head, initial encounter: Secondary | ICD-10-CM | POA: Diagnosis present

## 2016-09-08 DIAGNOSIS — J45909 Unspecified asthma, uncomplicated: Secondary | ICD-10-CM | POA: Diagnosis not present

## 2016-09-08 DIAGNOSIS — W19XXXA Unspecified fall, initial encounter: Secondary | ICD-10-CM

## 2016-09-08 MED ORDER — OXYCODONE-ACETAMINOPHEN 5-325 MG PO TABS
1.0000 | ORAL_TABLET | Freq: Once | ORAL | Status: AC
  Administered 2016-09-09: 1 via ORAL
  Filled 2016-09-08: qty 1

## 2016-09-08 NOTE — ED Notes (Signed)
Pt in w/c-blanket x 3-NAD
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
## 2016-09-09 ENCOUNTER — Emergency Department (HOSPITAL_BASED_OUTPATIENT_CLINIC_OR_DEPARTMENT_OTHER): Payer: Medicare HMO

## 2016-09-09 MED ORDER — METHOCARBAMOL 500 MG PO TABS
500.0000 mg | ORAL_TABLET | Freq: Once | ORAL | Status: AC
  Administered 2016-09-09: 500 mg via ORAL
  Filled 2016-09-09: qty 1

## 2016-09-09 NOTE — ED Notes (Signed)
Patient transported to X-ray 

## 2016-09-09 NOTE — ED Notes (Signed)
ED Provider at bedside. 

## 2016-09-09 NOTE — ED Notes (Signed)
Patient transported to CT 

## 2016-09-09 NOTE — Discharge Instructions (Signed)

## 2016-10-26 IMAGING — CT CT HEAD W/O CM
2 series · 16 of 30 positions shown, 20 images · non-contrast
Comparison: 10/13/2014

CLINICAL DATA: Intermittent left side headache for 2 weeks, blurred
vision

EXAM:
CT HEAD WITHOUT CONTRAST
TECHNIQUE: Contiguous axial images were obtained from the base of the skull
through the vertex without intravenous contrast.

[Series 2: head w/o · axial · non-contrast · 0.45mm/px · z∈[-108,+17]mm · 13 of 31 slices shown, 17 images]
[im 3/31  brain]
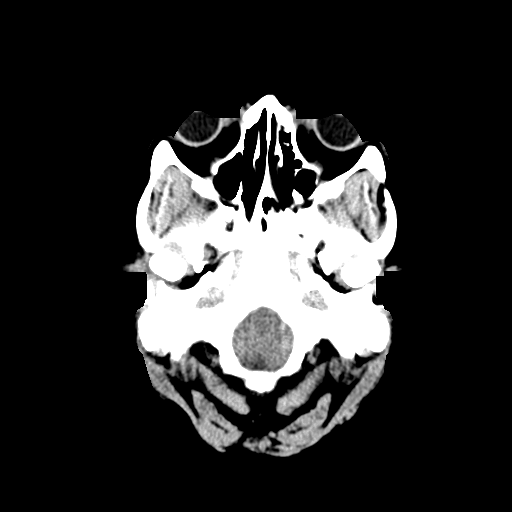
[im 3/31  bone]
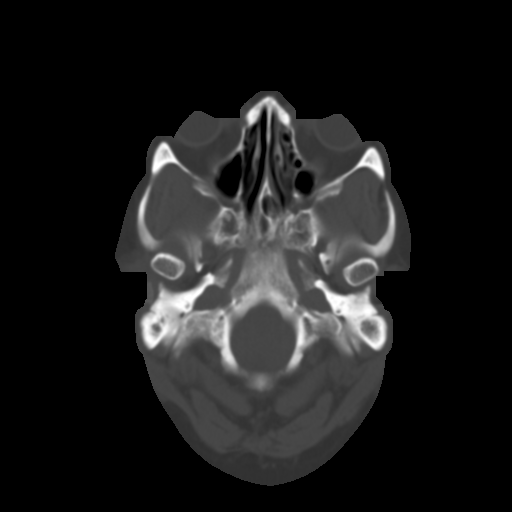
[im 5/31  brain]
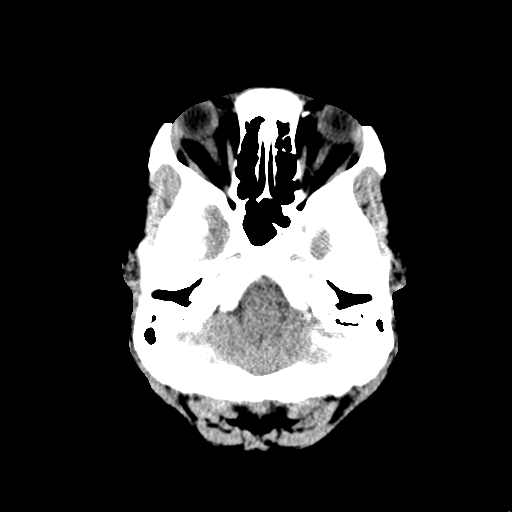
[im 7/31  brain]
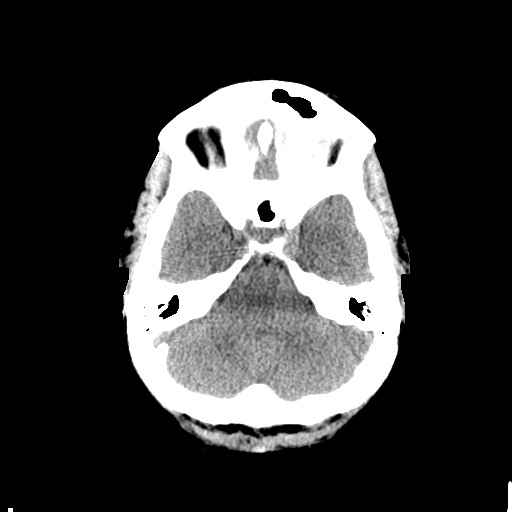
[im 9/31  brain]
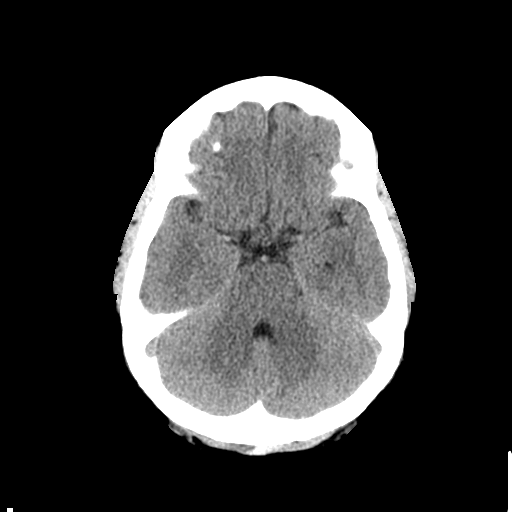
[im 11/31  brain]
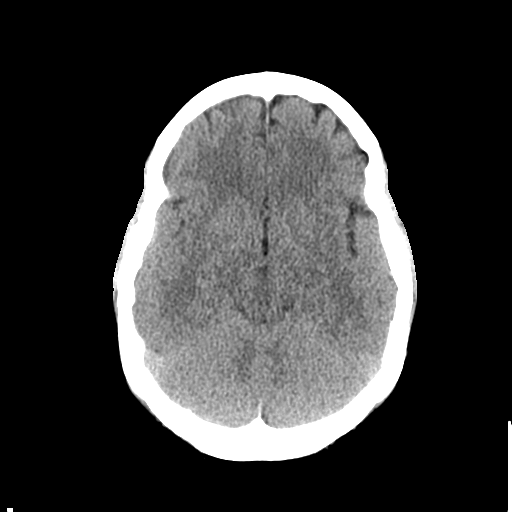
[im 11/31  bone]
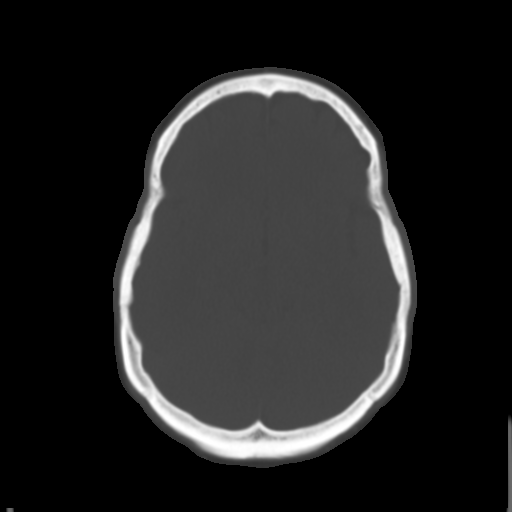
[im 13/31  brain]
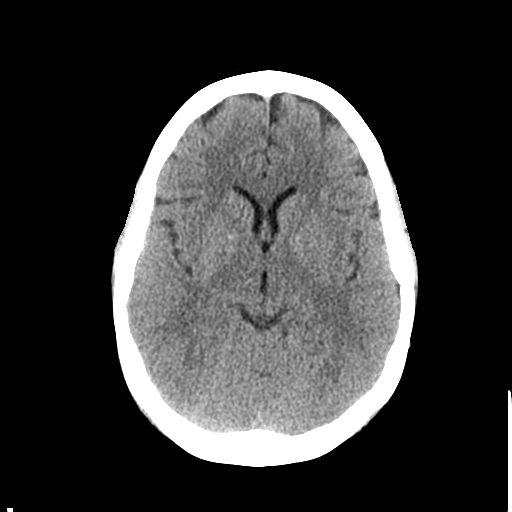
[im 16/31  brain]
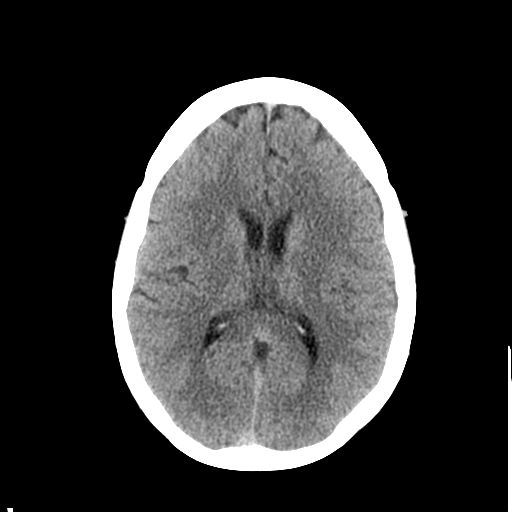
[im 18/31  brain]
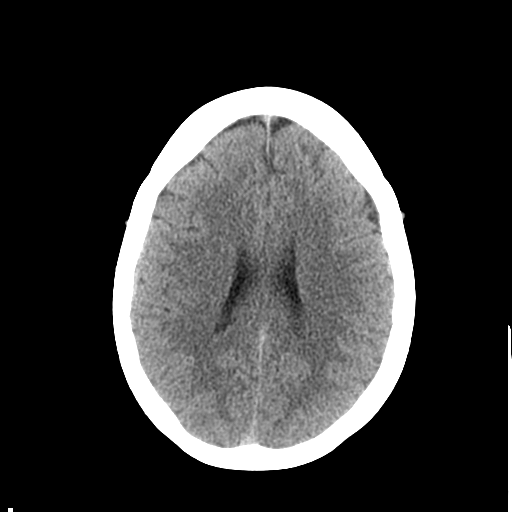
[im 20/31  brain]
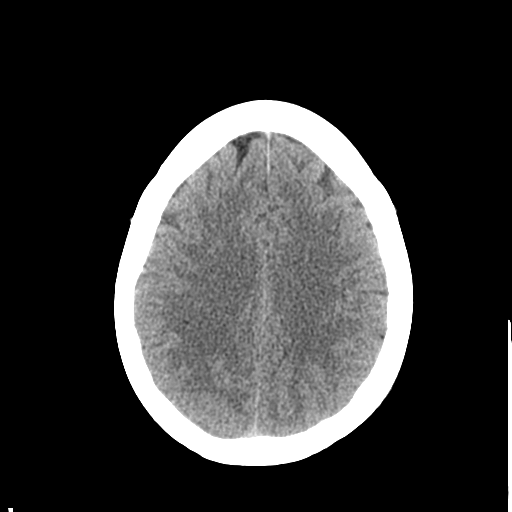
[im 20/31  bone]
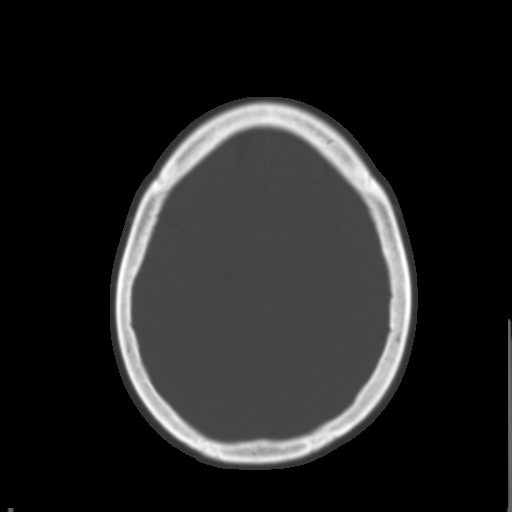
[im 22/31  brain]
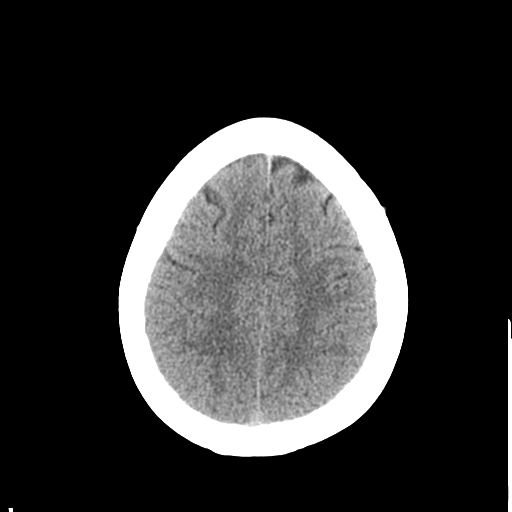
[im 24/31  brain]
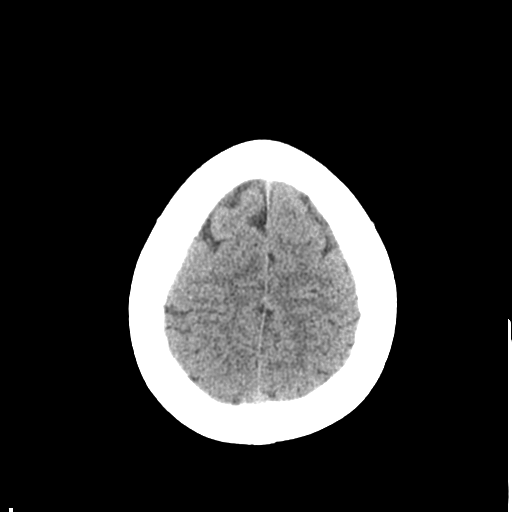
[im 26/31  brain]
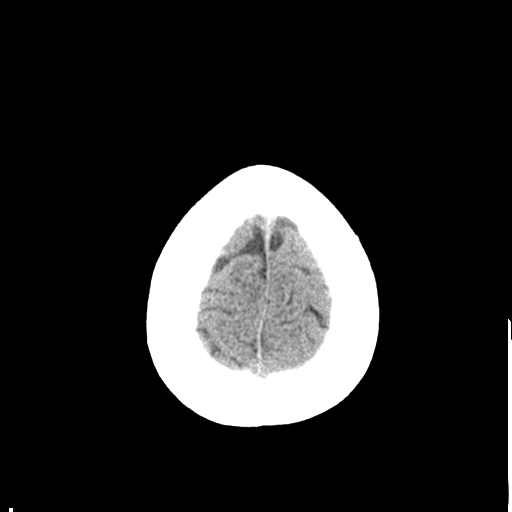
[im 28/31  brain]
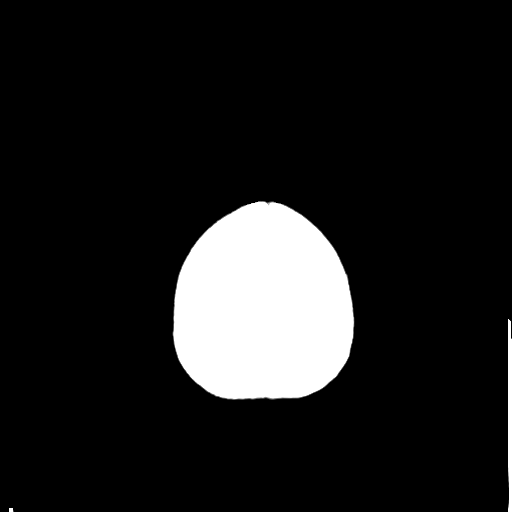
[im 28/31  bone]
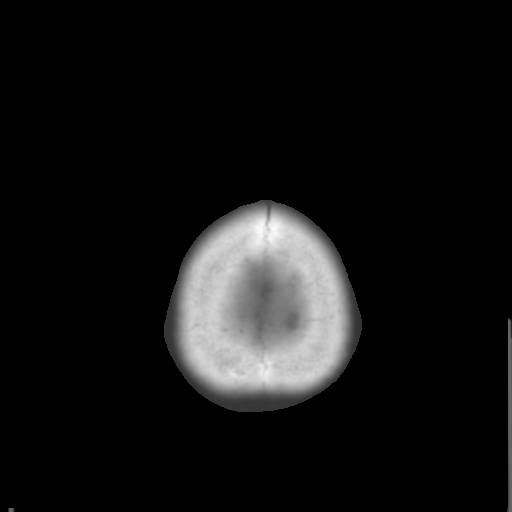

[Series 3: bone windows · axial · 0.45mm/px · z∈[-108,-68]mm · 3 of 31 slices shown]
[im 3/31  bone]
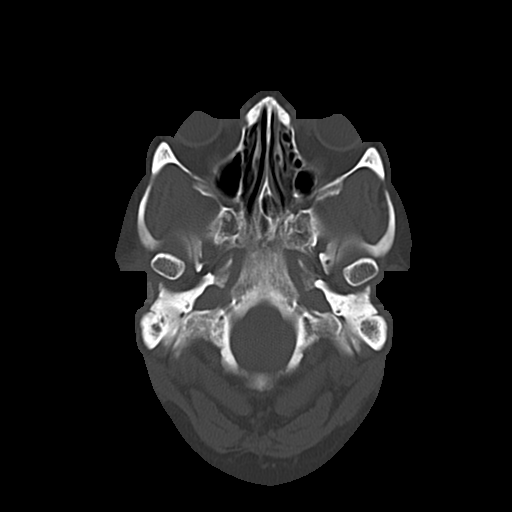
[im 7/31  bone]
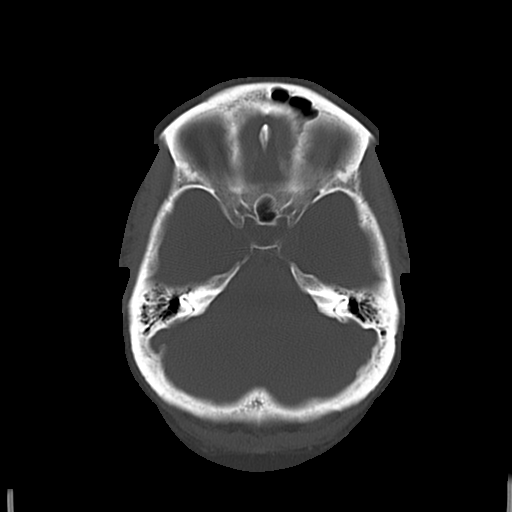
[im 11/31  bone]
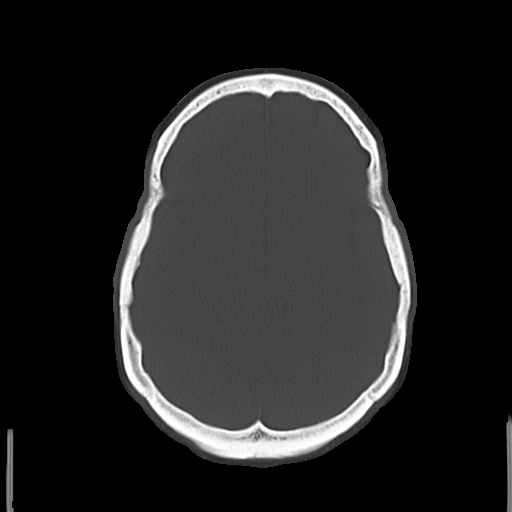

[16 of 30 positions shown; findings below may reference images not displayed]

FINDINGS: No skull fracture is noted. No intracranial hemorrhage, mass effect
or midline shift. No hydrocephalus. Paranasal sinuses and mastoid
air cells are unremarkable. No intra or extra-axial fluid
collection. No acute cortical infarction. No mass lesion is noted on
this unenhanced scan.
IMPRESSION: No acute intracranial abnormality.  No significant change.
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
## 2017-02-03 IMAGING — CT CT ANGIO NECK
1 of 10 series · 1 of 33 positions shown · IV contrast (Iohexol (Omnipaque 350))
Comparison: CT profusion study from the same day. CT head without
contrast from the same day.

CLINICAL DATA: Neck up stroke. Altered mental status. Unable to
speak clearly. Right-sided weakness and facial droop.

EXAM:
CT ANGIOGRAPHY HEAD AND NECK
TECHNIQUE: Multidetector CT imaging of the head and neck was performed using
the standard protocol during bolus administration of intravenous
contrast. Multiplanar CT image reconstructions and MIPs were
obtained to evaluate the vascular anatomy. Carotid stenosis
measurements (when applicable) are obtained utilizing NASCET
criteria, using the distal internal carotid diameter as the
denominator.
CONTRAST:  90mL OMNIPAQUE IOHEXOL 350 MG/ML SOLN

[Series 200: locator · axial · 0.49mm/px · 1 of 1 slices shown]
[im 1/1  soft-tissue]
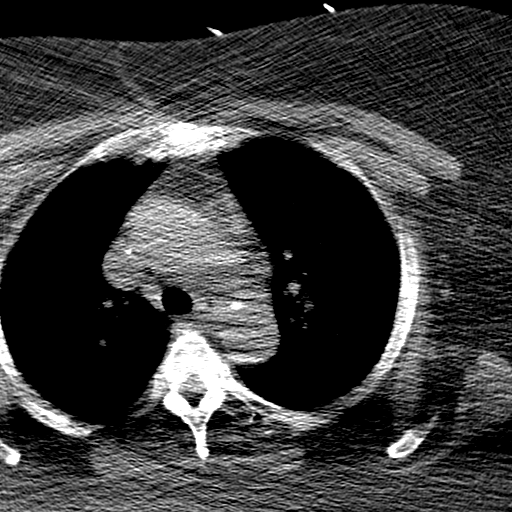

[1 of 33 positions shown; findings below may reference images not displayed]

FINDINGS: CT HEAD

Brain: Source images confirm an acute infarct involving the
posterior left insular ribbon is well is the cortex over the
superior left temporal lobe and left parietal lobe. No acute
hemorrhage or mass lesion is present. The ganglia are intact.

Calvarium and skull base: Negative.

Paranasal sinuses: Clear

Orbits: Within normal limits.

CTA NECK

Aortic arch: A 3 vessel arch configuration is present.
Atherosclerotic calcifications are present without significant
stenoses at the origins the great vessels.

Right carotid system: The right common carotid artery is within
normal limits. Minimal atherosclerotic changes are present at the
carotid bifurcation. The cervical right ICA is normal.

Left carotid system: The left common carotid artery is within normal
limits. Atherosclerotic changes are slightly more prominent within
the left carotid bifurcation. There is no significant stenosis
relative to the more distal vessel. The cervical left ICA is normal.

Vertebral arteries:The vertebral arteries both originate from the
subclavian arteries without significant stenosis. The left vertebral
artery is dominant. There is no focal stenosis or vascular injury
within the cervical vertebral arteries.

Skeleton: Mild endplate degenerative changes are most noted at C3-4
and C4-5. No focal lytic or blastic lesions are present.

Other neck: The soft tissues of the neck are unremarkable.

CTA HEAD

Anterior circulation: The internal carotid arteries are within
normal limits from the high cervical segments through the ICA
termini. The left A1 segment is dominant. The M1 segments are intact
bilaterally. There is a significant proximal stenosis in the more
lateral left M2 segment with significant attenuation of more distal
branch vessels no focal proximal occlusion is present. Right MCA and
bilateral ACA branch vessels are within normal limits.

Posterior circulation: The left vertebral artery is slightly
dominant to the right. The PICA origins are visualized and normal
bilaterally. Basilar artery is within normal limits. Both posterior
cerebral arteries originate the basilar tip. PCA branch vessels are
intact.

Venous sinuses: The dural sinuses are patent. The right transverse
sinus is dominant. The straight sinus and deep cerebral veins are
patent. Cortical veins are intact.

Anatomic variants: None

Delayed phase: Postcontrast images better demonstrate the areas of
developing acute nonhemorrhagic infarction involving the posterior
left insular cortex, left temporal lobe, and left parietal lobe. No
pathologic enhancement is evident.
IMPRESSION: 1. Developing acute/subacute nonhemorrhagic infarct of posterior
left insular cortex, superior left temporal, and left parietal lobe.
2. Moderate to severe focal stenosis within the more lateral left M2
branch with significant attenuation of distal MCA branch vessels
corresponding to the infarct territory.
3. No other significant proximal stenosis, aneurysm, or branch
vessel occlusion.
These results were called by telephone at the time of interpretation
on 05/26/2015 at [DATE] to Dr. Struc, who verbally acknowledged
these results.

## 2017-02-03 IMAGING — CT CT HEAD W/O CM
2 series · 15 of 30 positions shown, 17 images · non-contrast
Comparison: 04/21/2015

CLINICAL DATA: Unresponsive

EXAM:
CT HEAD WITHOUT CONTRAST
TECHNIQUE: Contiguous axial images were obtained from the base of the skull
through the vertex without intravenous contrast.

[Series 2: head without · axial · non-contrast · 0.43mm/px · z∈[-110,+15]mm · 7 of 35 slices shown, 9 images]
[im 5/35  brain]
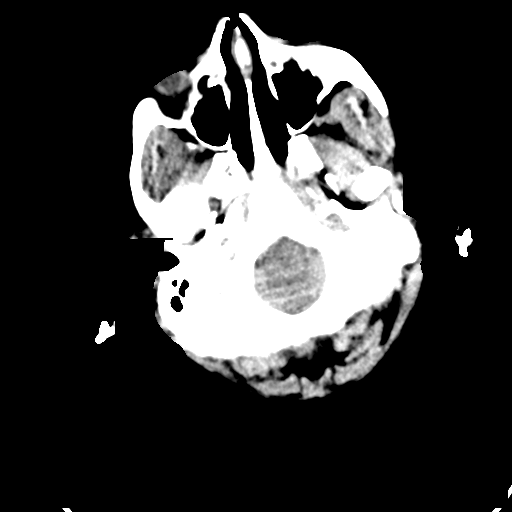
[im 5/35  bone]
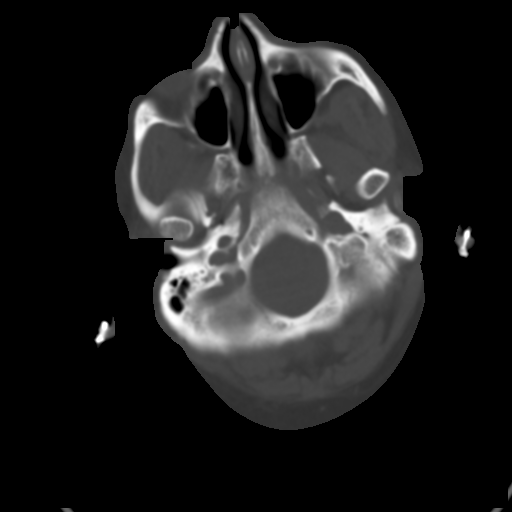
[im 9/35  brain]
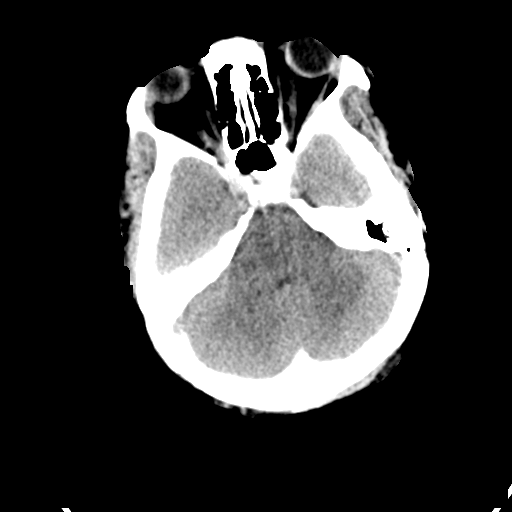
[im 13/35  brain]
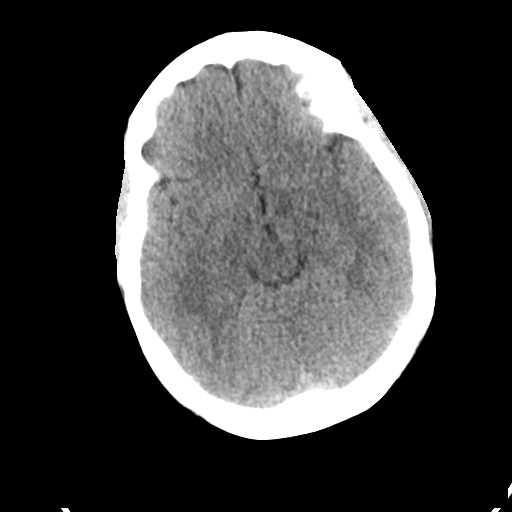
[im 18/35  brain]
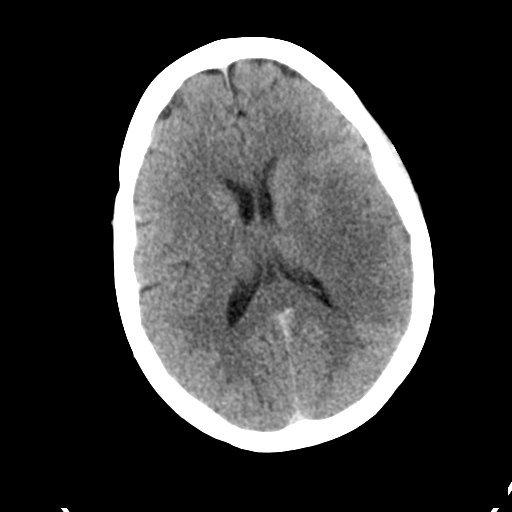
[im 22/35  brain]
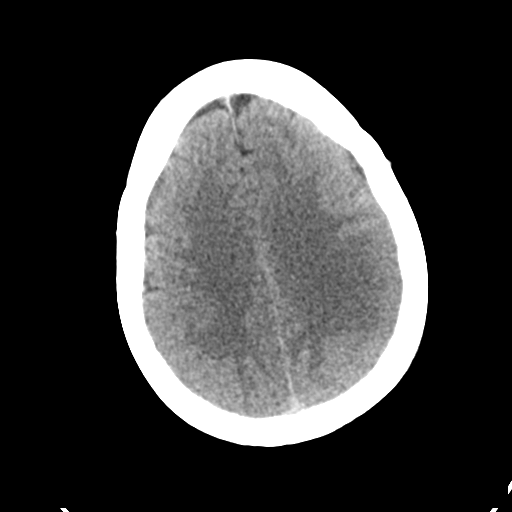
[im 22/35  bone]
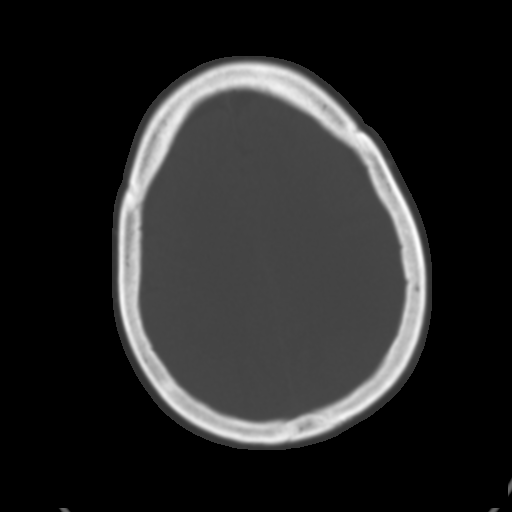
[im 26/35  brain]
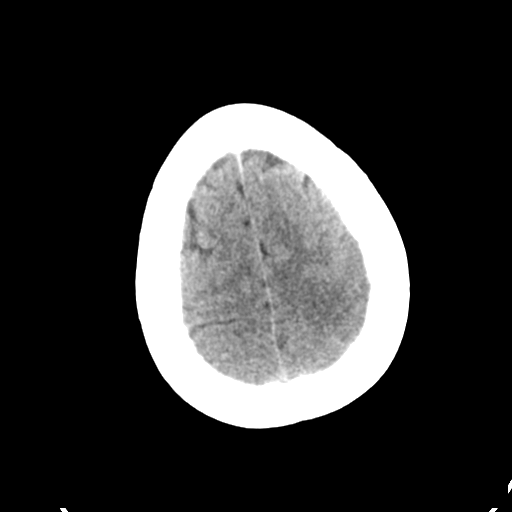
[im 30/35  brain]
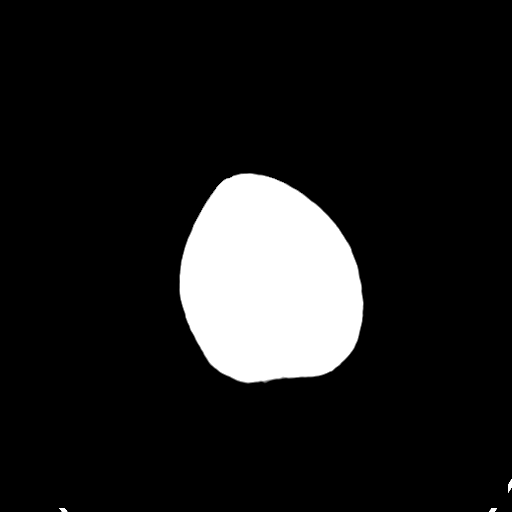

[Series 3: head bone · axial · 0.43mm/px · z∈[-114,+24]mm · 8 of 87 slices shown]
[im 9/87  bone]
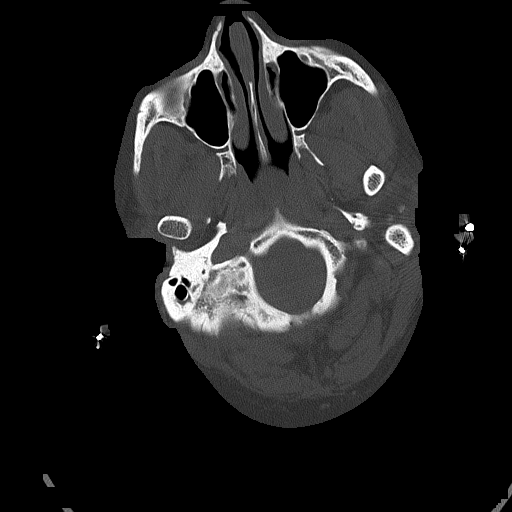
[im 18/87  bone]
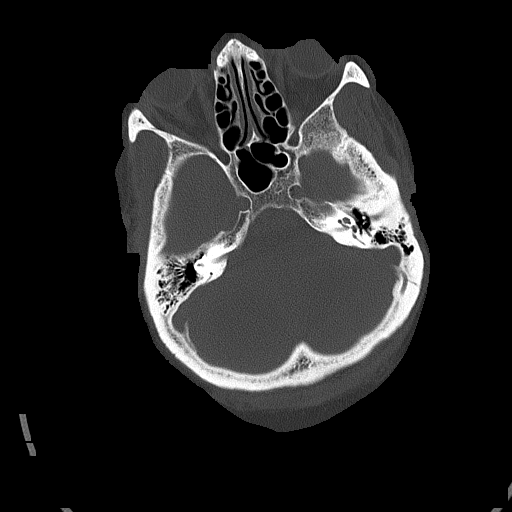
[im 26/87  bone]
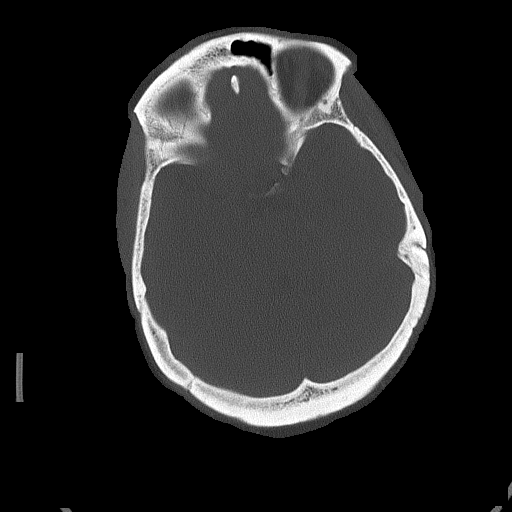
[im 39/87  bone]
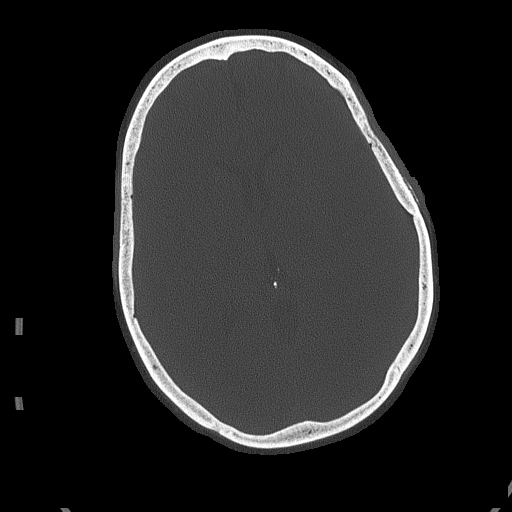
[im 48/87  bone]
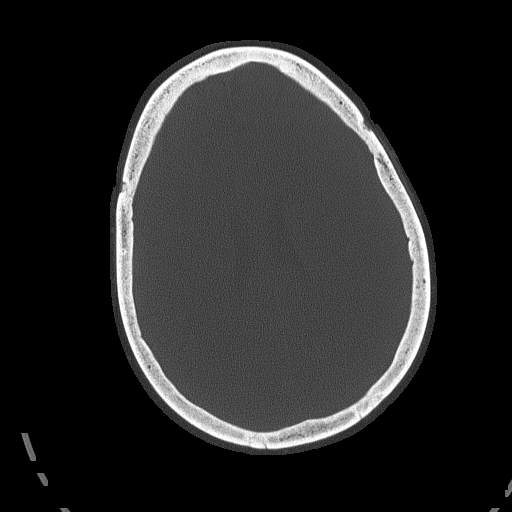
[im 61/87  bone]
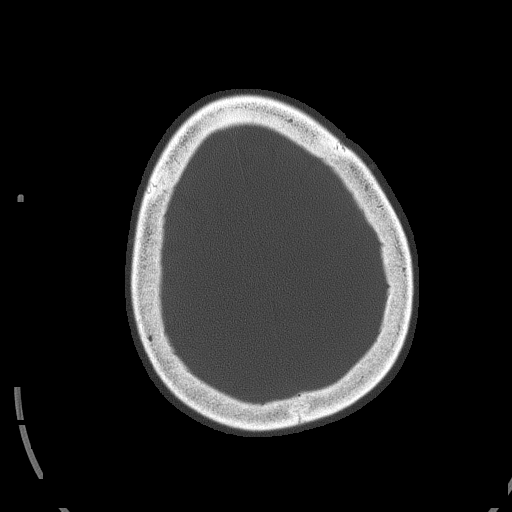
[im 69/87  bone]
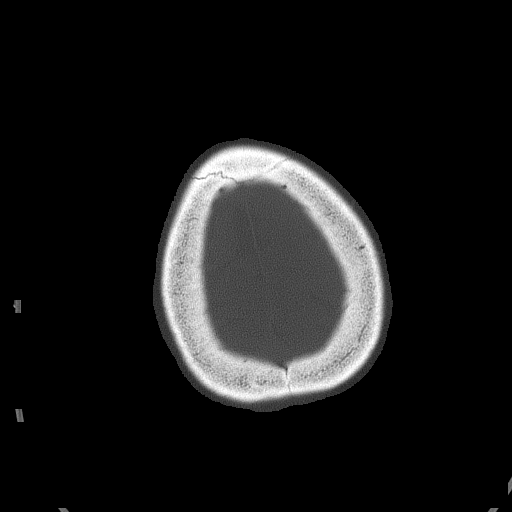
[im 78/87  bone]
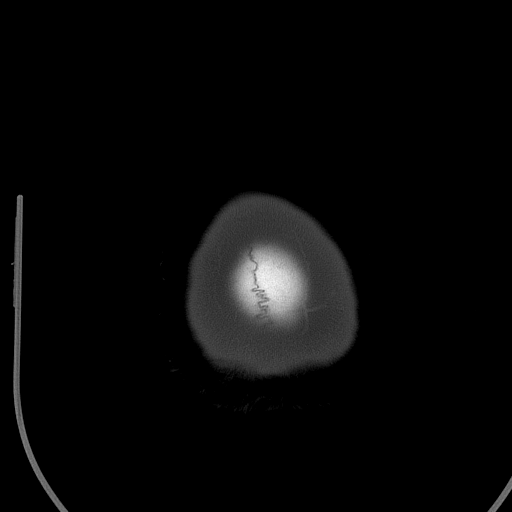

[15 of 30 positions shown; findings below may reference images not displayed]

FINDINGS: Bony calvarium is intact. No acute hemorrhage or space-occupying
mass lesion is noted. There is some blunting of the sulcal markings
in the left parietal lobe near the vertex which may represent some
very early ischemia. No other focal abnormality is noted.
IMPRESSION: Question early ischemia in distribution of the left middle cerebral
artery. No other focal abnormality is noted.

These results were called by telephone at the time of interpretation
on 05/26/2015 at [DATE] to Dr. GIORGI JUMPER , who verbally
acknowledged these results.

## 2017-02-03 IMAGING — CT CT CEREBRAL PERFUSION W/CM
1 series · 15 of 30 positions shown, 19 images · IV contrast (Iohexol (Omnipaque 350))
Comparison: CT head without contrast from the same date.

ADDENDUM:
These results were called by telephone at the time of interpretation
on 05/26/2015 at [DATE] to Dr. Nass, who verbally acknowledged
these results.
CLINICAL DATA: Altered mental status.  Mental SP.

EXAM:
CT CEREBRAL PERFUSION WITH CONTRAST
TECHNIQUE: Dynamic imaging was performed following the pulse injection of
contrast. Para metric maps were subsequently calculated.
CONTRAST:  90mL OMNIPAQUE IOHEXOL 350 MG/ML SOLN

[Series 201: perfusion · axial · 0.43mm/px · z∈[+897,+972]mm · 15 of 640 slices shown, 19 images]
[im 23/640  brain]
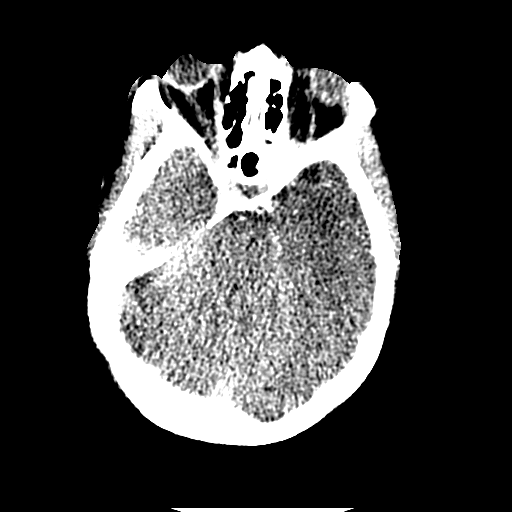
[im 23/640  bone]
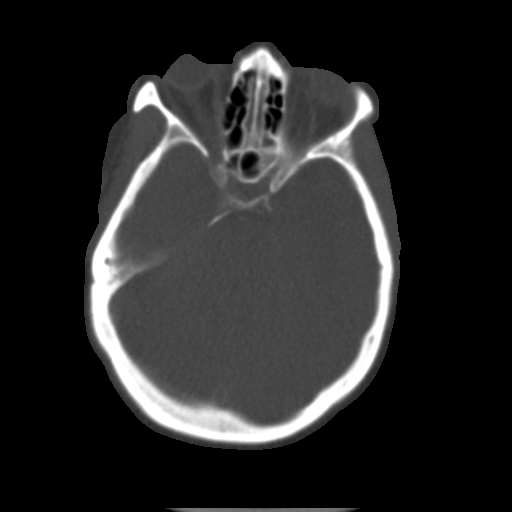
[im 67/640  brain]
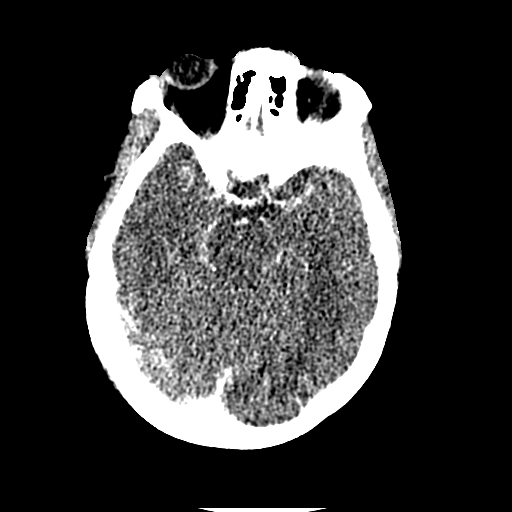
[im 111/640  brain]
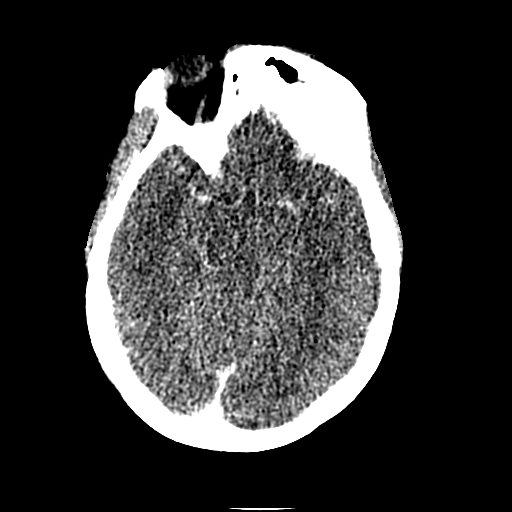
[im 155/640  brain]
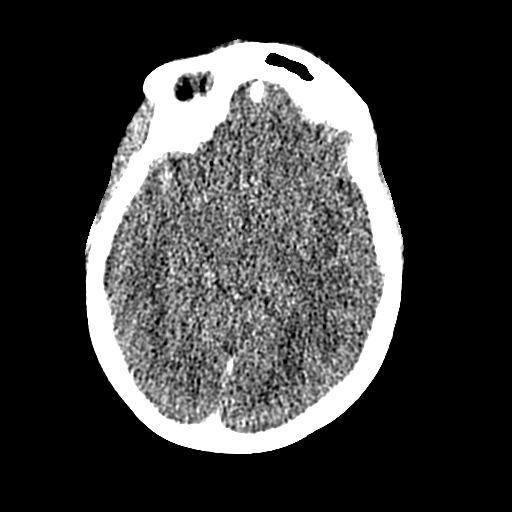
[im 199/640  brain]
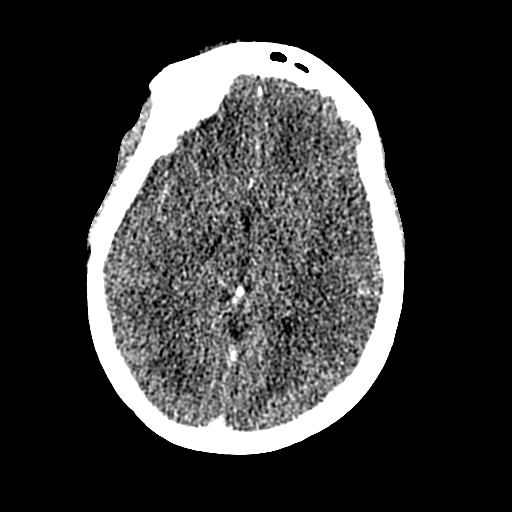
[im 199/640  bone]
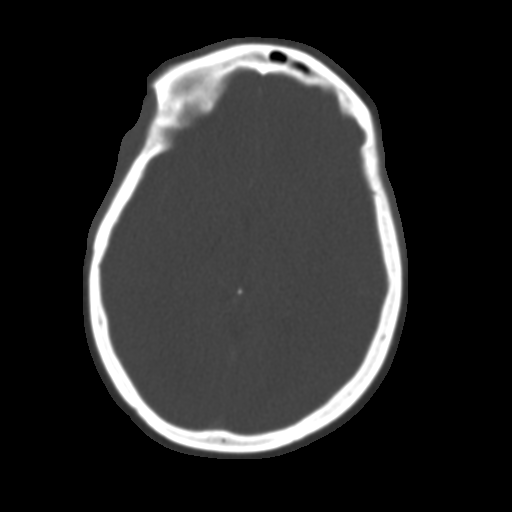
[im 243/640  brain]
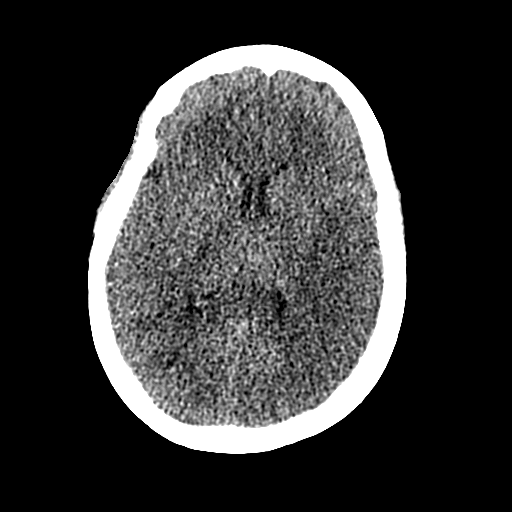
[im 287/640  brain]
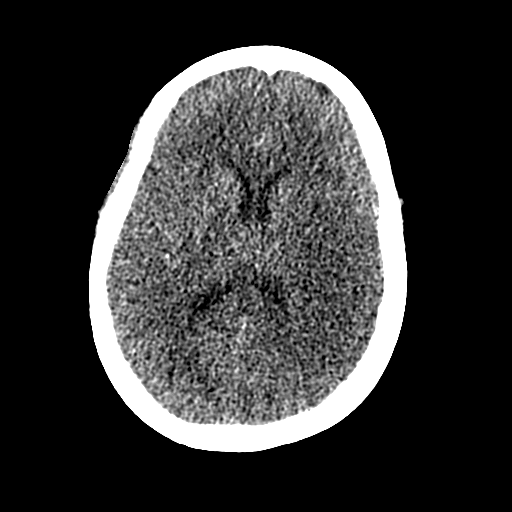
[im 331/640  brain]
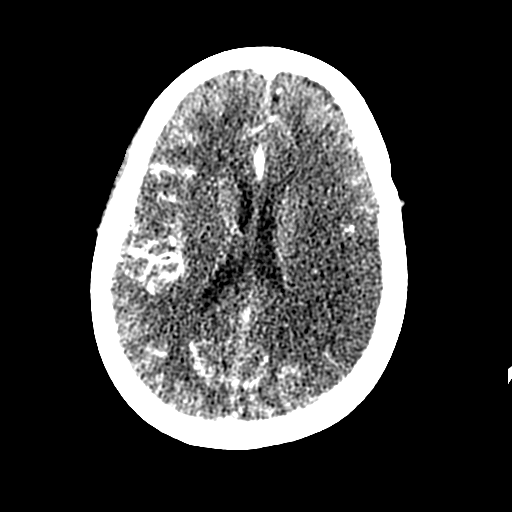
[im 353/640  brain]
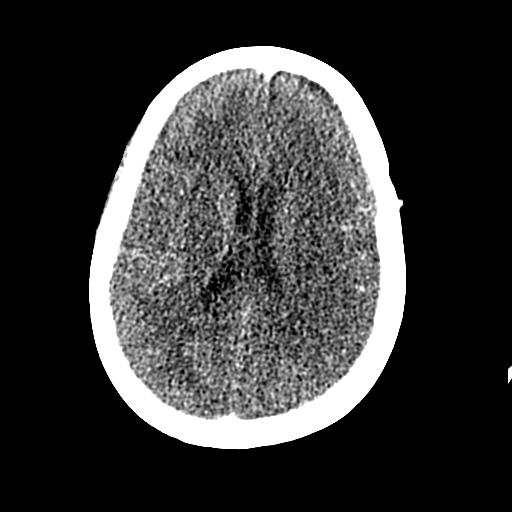
[im 353/640  bone]
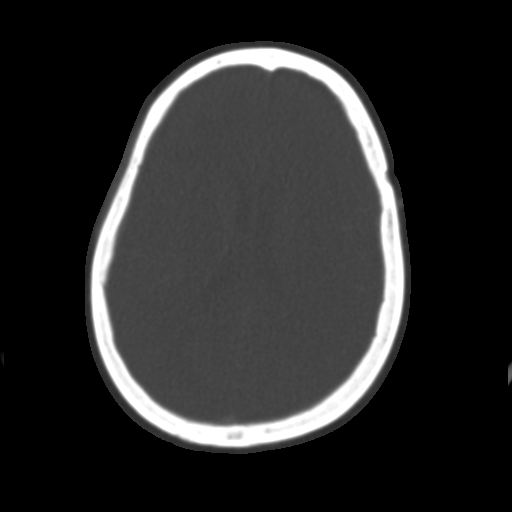
[im 397/640  brain]
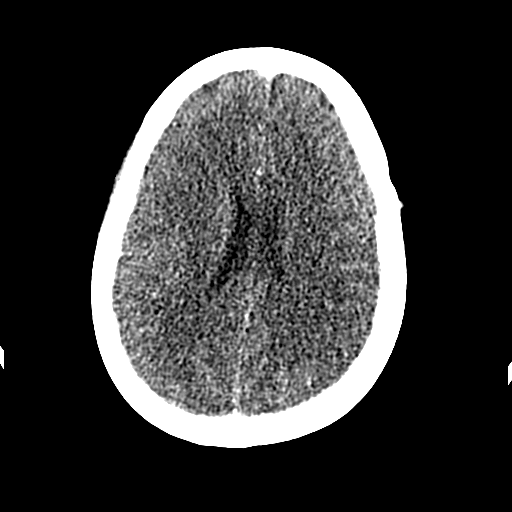
[im 441/640  brain]
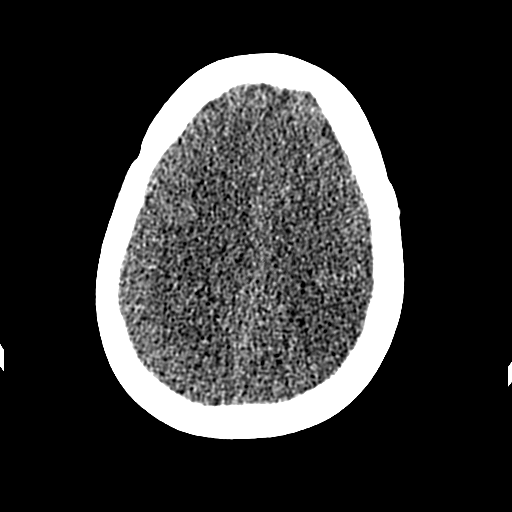
[im 485/640  brain]
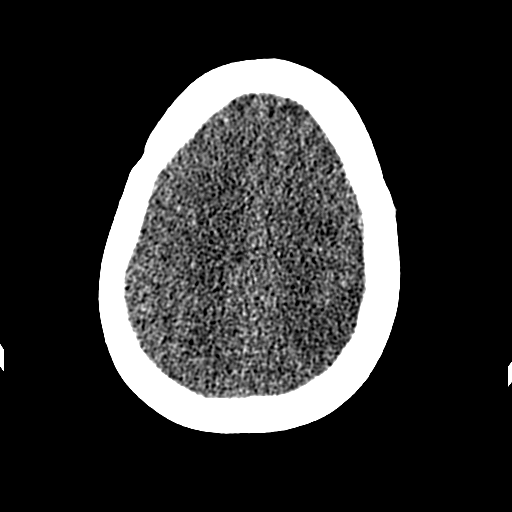
[im 529/640  brain]
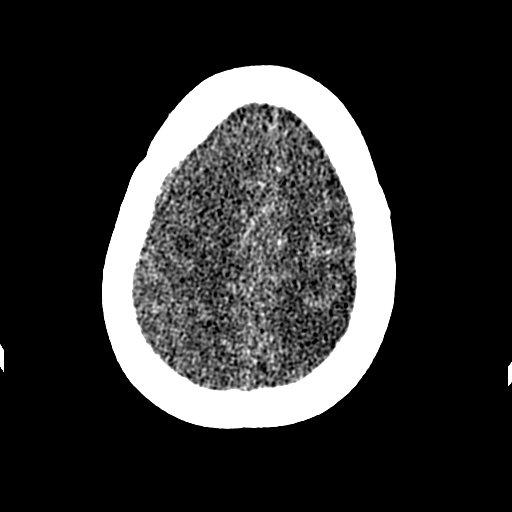
[im 529/640  bone]
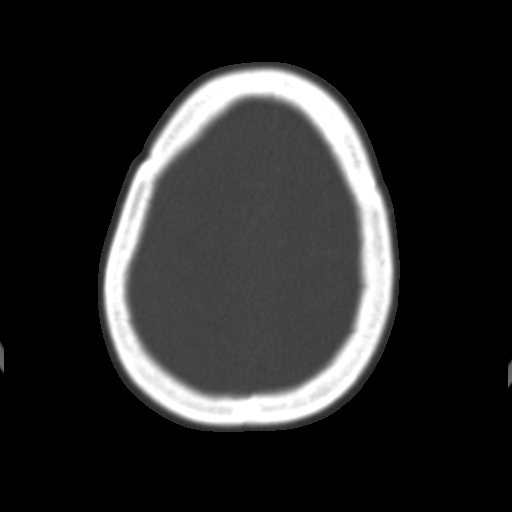
[im 573/640  brain]
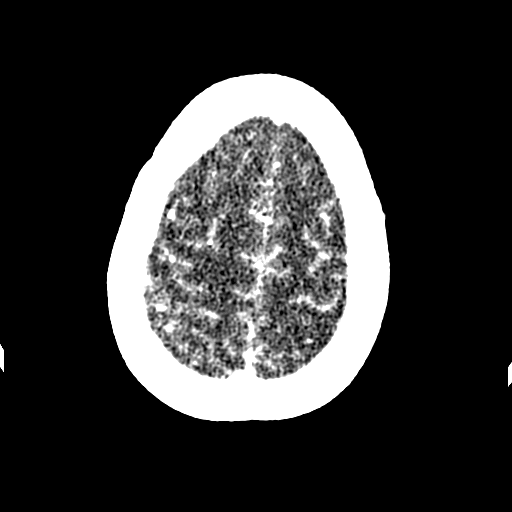
[im 617/640  brain]
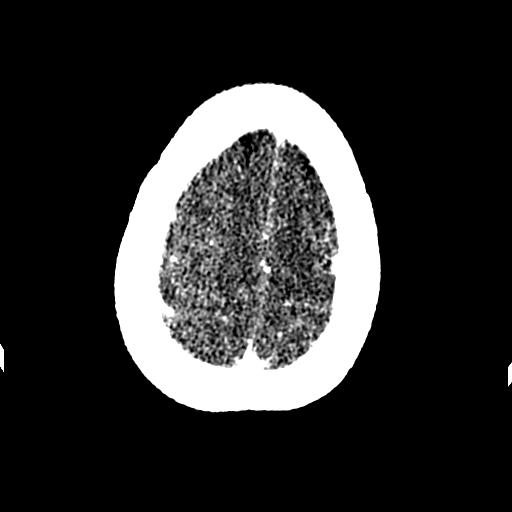

[15 of 30 positions shown; findings below may reference images not displayed]

FINDINGS: Being time to transit is ext the and cerebral blood flow is
diminished in the superior left temporal lobe and left parietal lobe
within the posterior left MCA distribution. This involves posterior
aspect of the left insular ribbon.

There is less profound decrease in cerebral blood volume within the
same territory. Less than [DATE] of the left MCA territory constitutes
core infarct.

Parameters are within normal limits in the right hemisphere
IMPRESSION: 1. Large area of ischemia involving the posterior aspect of the left
MCA territory. This involves the posterior left insular ribbon,
superior left temporal lobe, and left parietal white.
2. More subtle cerebral blood volume changes with the area of core
infarct constituting less than [DATE] of the left MCA territory.

## 2017-02-04 IMAGING — MR MR HEAD W/O CM
6 of 9 series · 28 of 48 positions shown · non-contrast
Comparison: CTA head and neck, CT perfusion, and CT head 05/26/2015

CLINICAL DATA: Left MCA territory infarct. Abnormal speech. Facial
droop.

The examination had to be discontinued prior to completion due to
patient unable to tolerate despite medication.
EXAM:
MRI HEAD WITHOUT CONTRAST
MRA HEAD WITHOUT CONTRAST
TECHNIQUE: Multiplanar, multiecho pulse sequences of the brain and surrounding
structures were obtained without intravenous contrast. Angiographic
images of the head were obtained using MRA technique without
contrast.

[Series 3: DWI · axial · 3.6mm · 0.94mm/px · z∈[-76,+75]mm · 6 of 86 slices shown (1 of 4)]
[im 1/86]
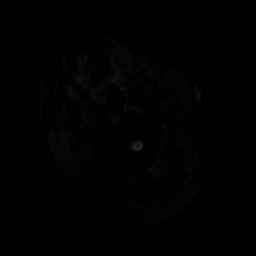
[im 18/86]
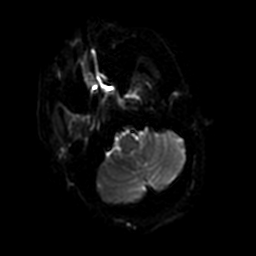
[im 35/86]
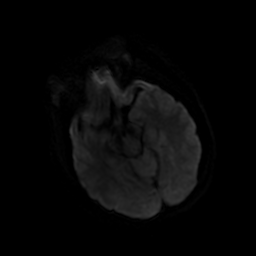
[im 52/86]
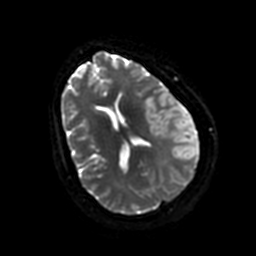
[im 69/86]
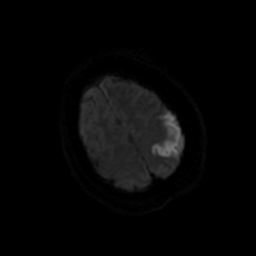
[im 86/86]
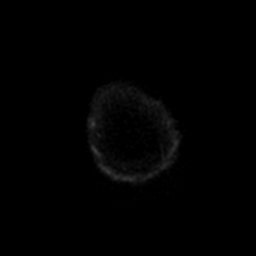

[Series 8: DWI · coronal · 5.0mm · 0.94mm/px · 4 of 72 slices shown (2 of 4)]
[im 1/72]
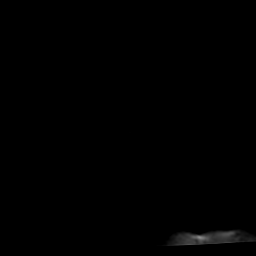
[im 24/72]
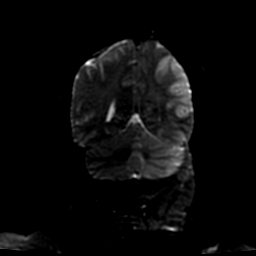
[im 48/72]
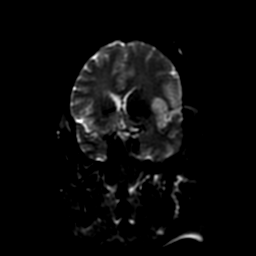
[im 72/72]
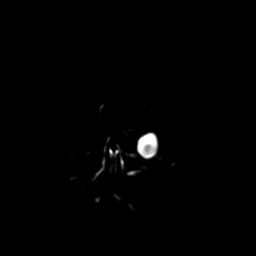

[Series 9: (person_name) · axial · 3.0mm · 0.47mm/px · z∈[-54,+101]mm · 6 of 104 slices shown]
[im 1/104]
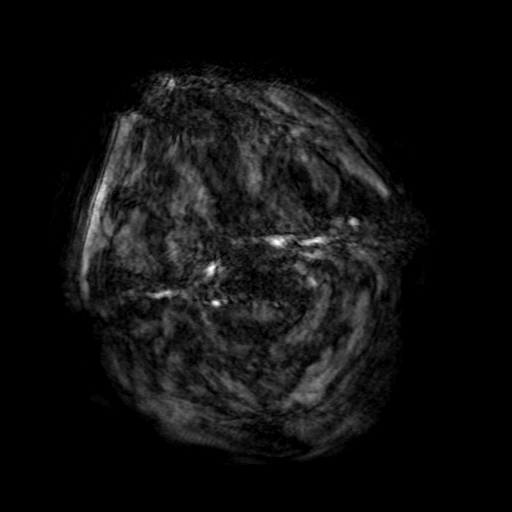
[im 21/104]
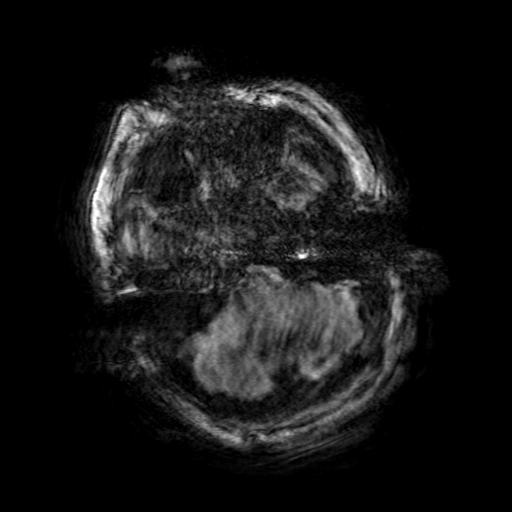
[im 42/104]
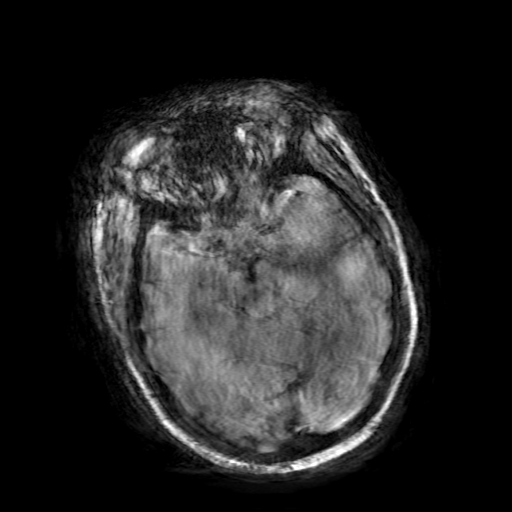
[im 62/104]
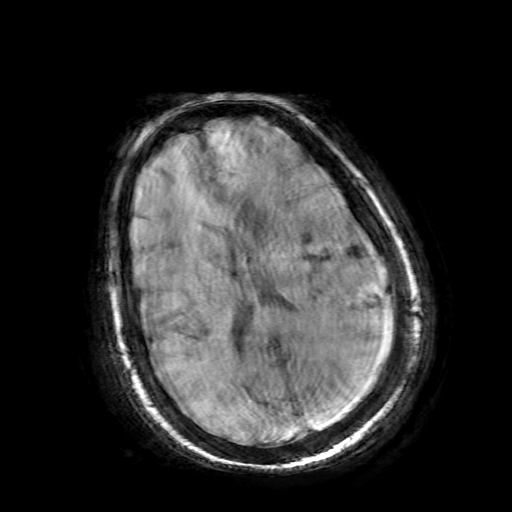
[im 83/104]
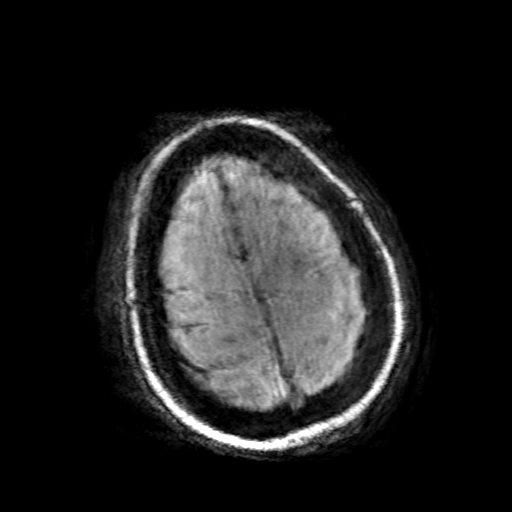
[im 104/104]
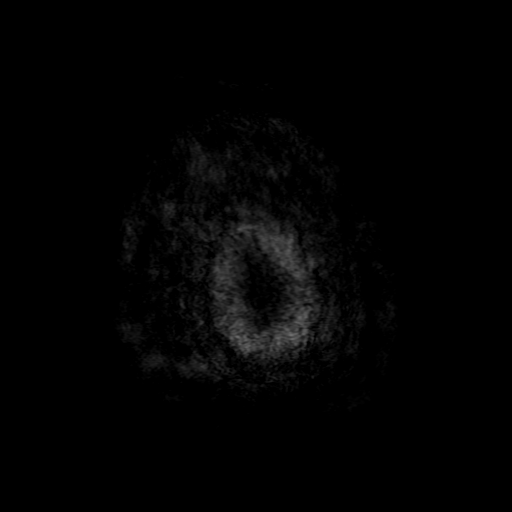

[Series 13: ax (id) 2 · axial · 1.2mm · 0.43mm/px · z∈[-56,+63]mm · 8 of 200 slices shown]
[im 1/200]
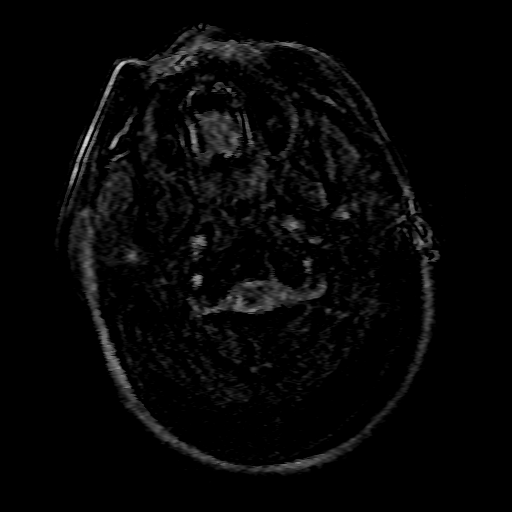
[im 40/200]
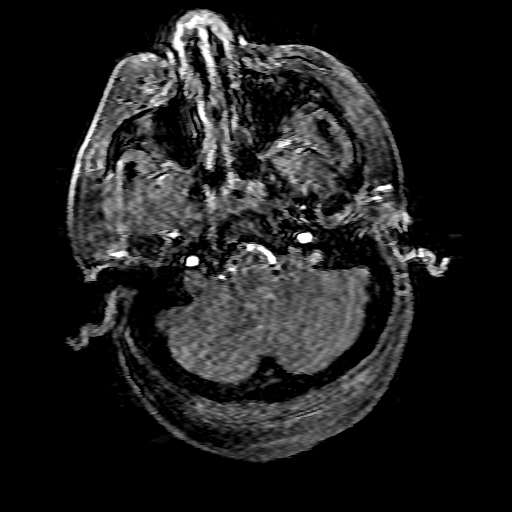
[im 60/200]
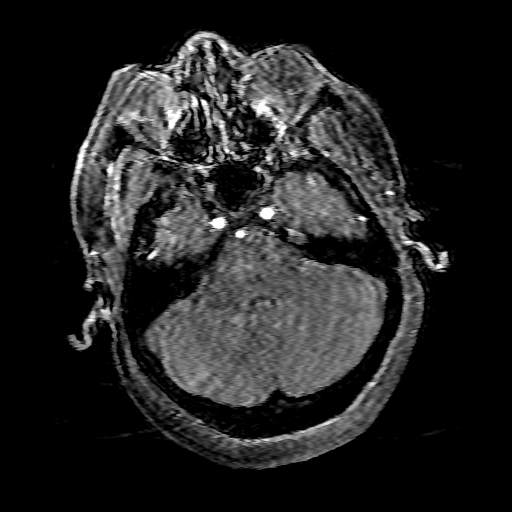
[im 80/200]
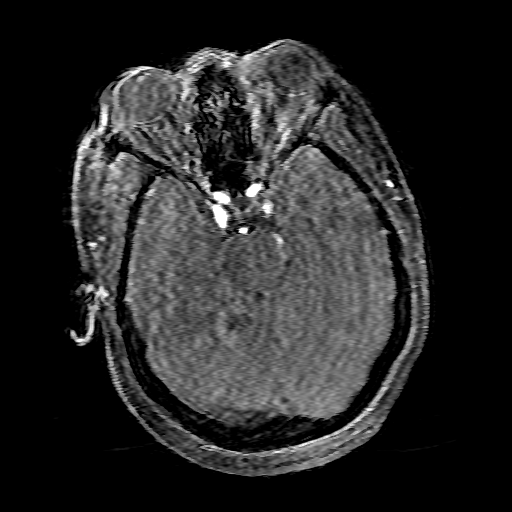
[im 120/200]
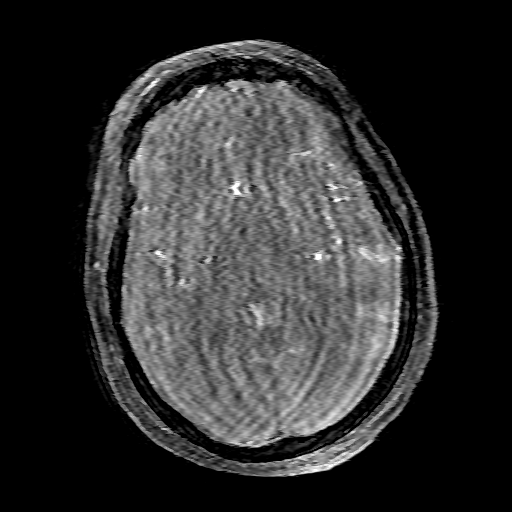
[im 140/200]
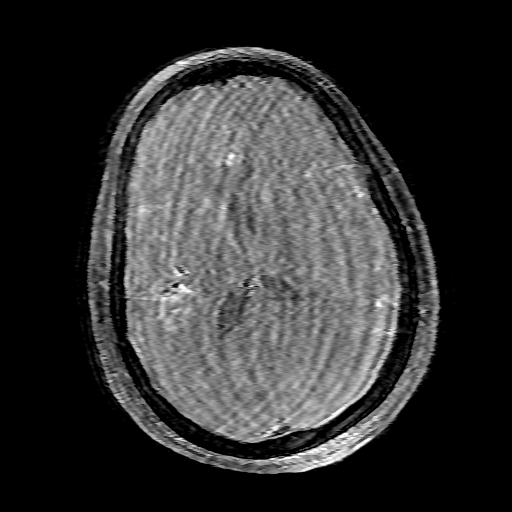
[im 160/200]
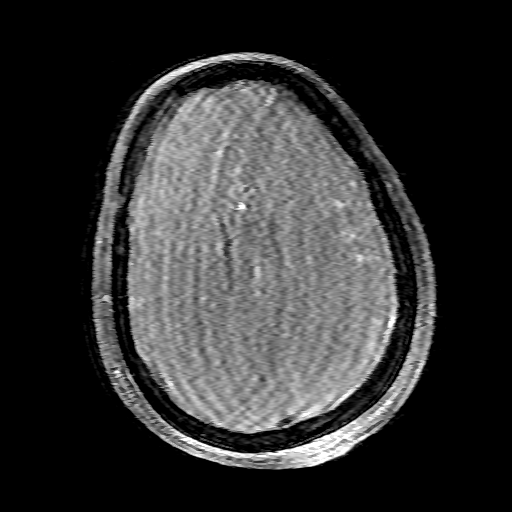
[im 200/200]
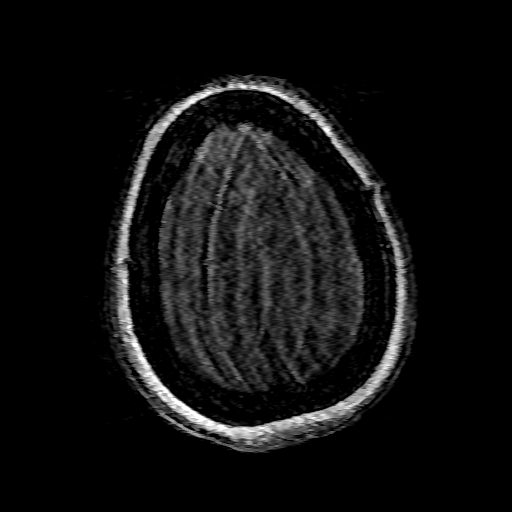

[Series 300: DWI · axial · 3.6mm · 0.94mm/px · z∈[-76,+75]mm · 2 of 43 slices shown (3 of 4)]
[im 1/43]
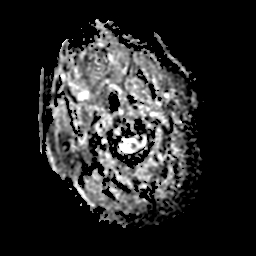
[im 43/43]
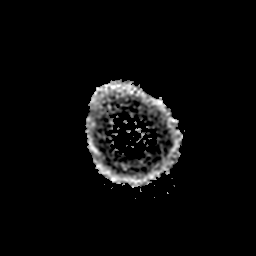

[Series 800: DWI · coronal · 5.0mm · 0.94mm/px · 2 of 35 slices shown (4 of 4)]
[im 1/35]
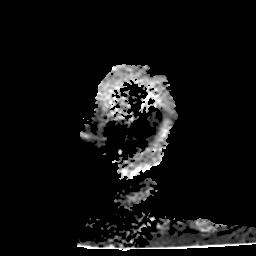
[im 35/35]
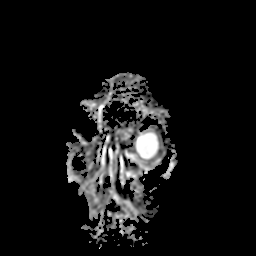

[28 of 48 positions shown; findings below may reference images not displayed]

FINDINGS: MRI HEAD FINDINGS

The diffusion-weighted images confirm an acute infarct involving the
posterior aspect of the left MCA territory. The posterior tooth
there is the left insular cortex are involved. There is involvement
of the posterior left frontal lobe, left parietal lobe, in the
superior left temporal lobe.

There is significant patient motion which could cause some artifact
on the susceptibility weighted imaging. However, petechial
hemorrhages suspected within the left temporal and parietal lobe
infarct.

MRA HEAD FINDINGS

The MRA is also distorted by patient motion. Internal carotid
arteries are within normal limits from the high cervical segments
through the ICA termini bilaterally. The A1 and M1 segments are
intact. The MCA bifurcations are intact. There is significant
attenuation of MCA branch vessels bilaterally, distorted by patient
motion.

The left vertebral artery is the dominant vessel. The PICA origins
are visualized and normal bilaterally. The basilar artery is within
normal limits. Both posterior cerebral arteries originate from
basilar tip. The posterior cerebral arteries are within normal
limits bilaterally.
IMPRESSION: 1. Confirmation of an acute infarct involving the posterior left MCA
territory.
2. Probable petechial hemorrhage within the infarct.
3. Significant attenuation of proximal MCA branch vessels
bilaterally without a more proximal stenosis or occlusion.
4. The study is moderately degraded by patient motion, limiting
evaluation of medium and distal small vessels.

## 2017-02-17 IMAGING — CT CT HEAD W/O CM
2 series · 15 of 30 positions shown, 19 images · non-contrast
Comparison: Multiple prior studies including CT head 05/26/15, CT
perfusion and CT angio head [DATE], and MR brain 05/27/15.

CLINICAL DATA: Sudden onset of slurred speech. Suspected focal
seizure.

EXAM:
CT HEAD WITHOUT CONTRAST
TECHNIQUE: Contiguous axial images were obtained from the base of the skull
through the vertex without intravenous contrast.

[Series 201: head w/o, idose (1) · axial · non-contrast · 0.42mm/px · z∈[+76,+196]mm · 13 of 29 slices shown, 17 images]
[im 3/29  brain]
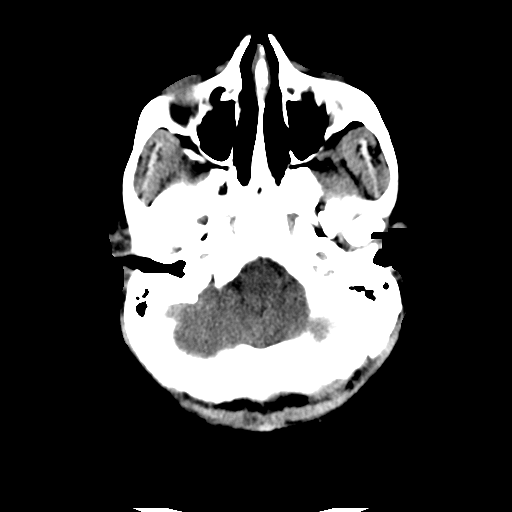
[im 3/29  bone]
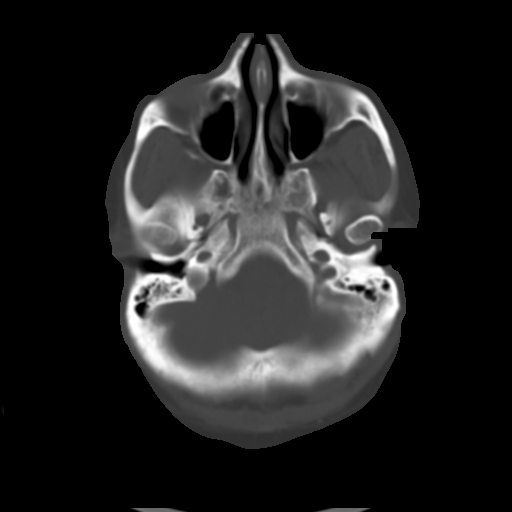
[im 5/29  brain]
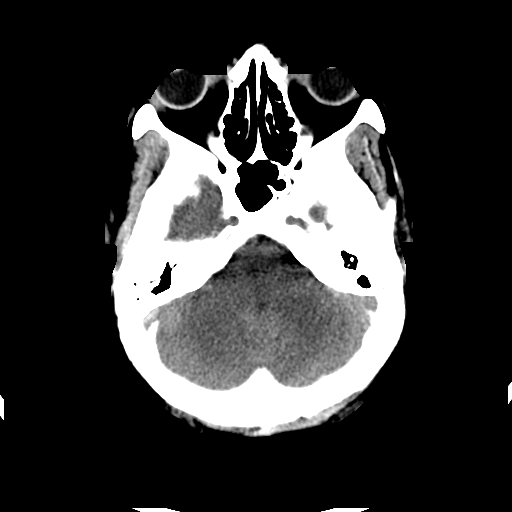
[im 7/29  brain]
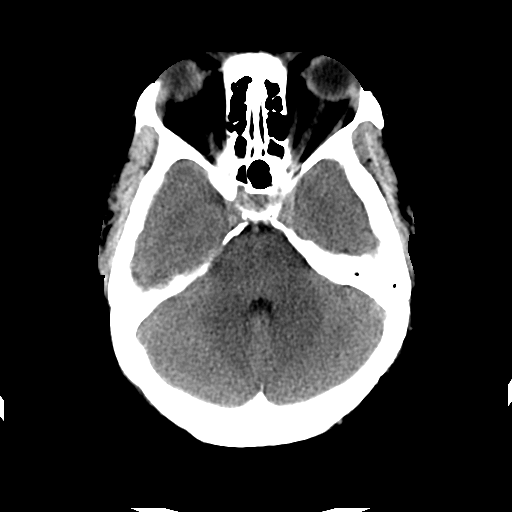
[im 9/29  brain]
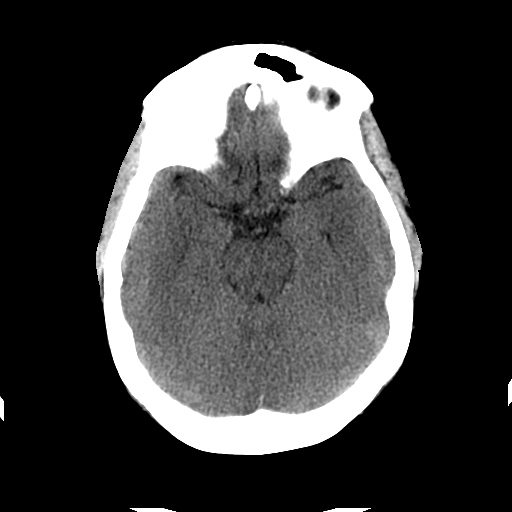
[im 11/29  brain]
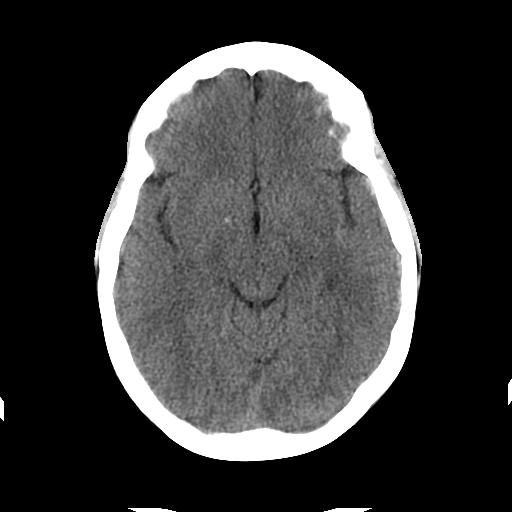
[im 11/29  bone]
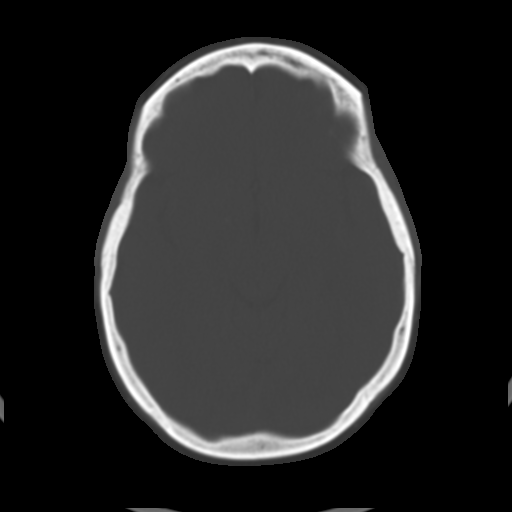
[im 13/29  brain]
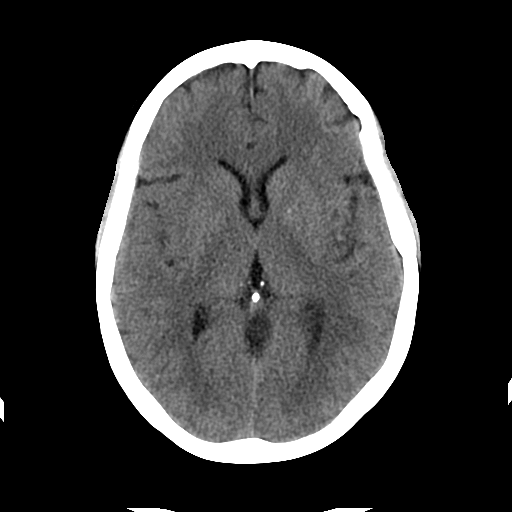
[im 15/29  brain]
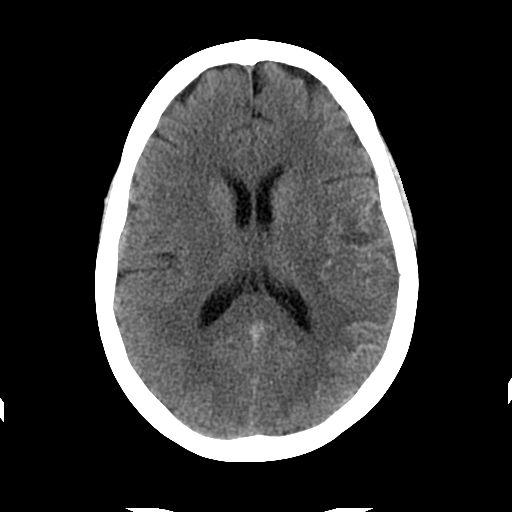
[im 17/29  brain]
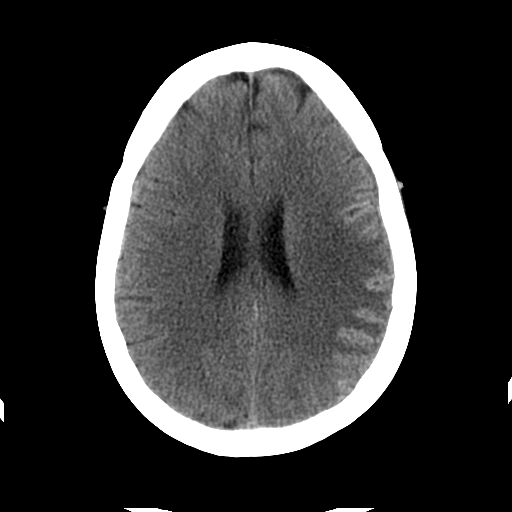
[im 19/29  brain]
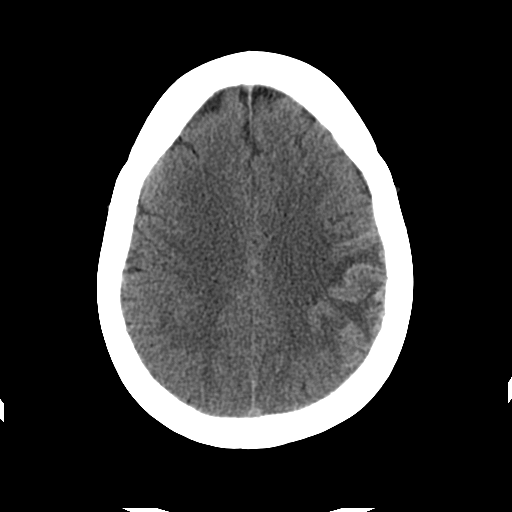
[im 19/29  bone]
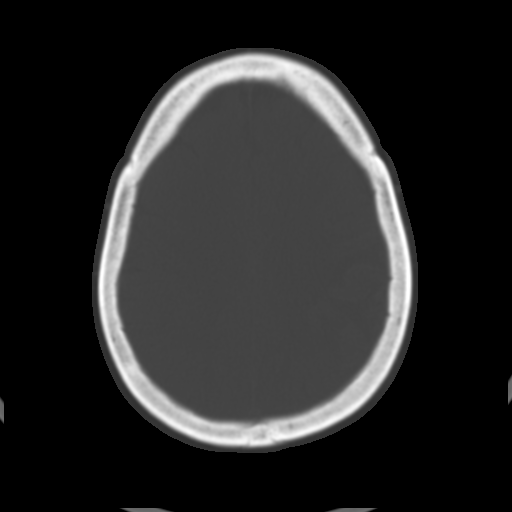
[im 21/29  brain]
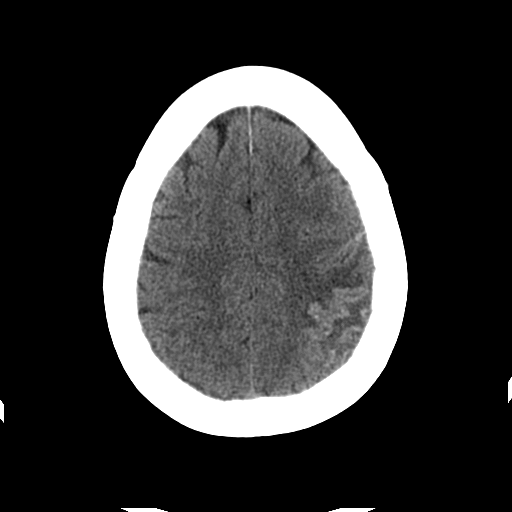
[im 23/29  brain]
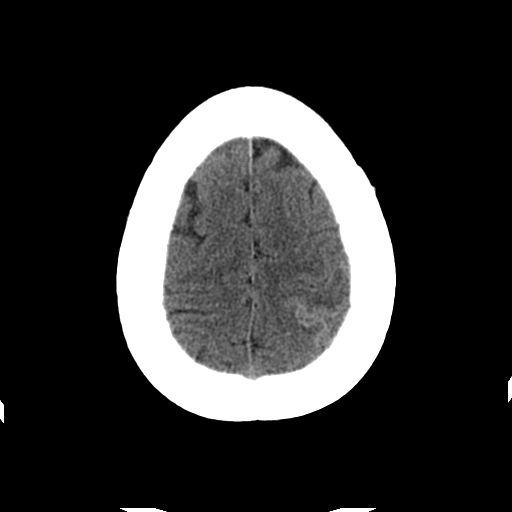
[im 25/29  brain]
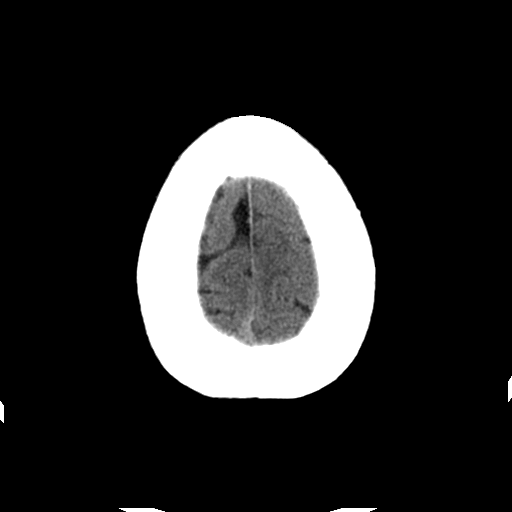
[im 27/29  brain]
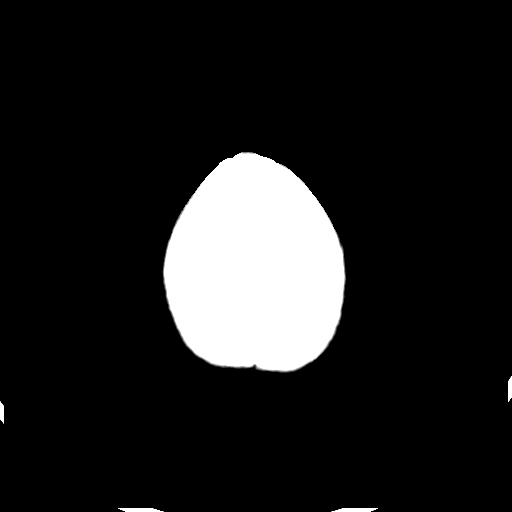
[im 27/29  bone]
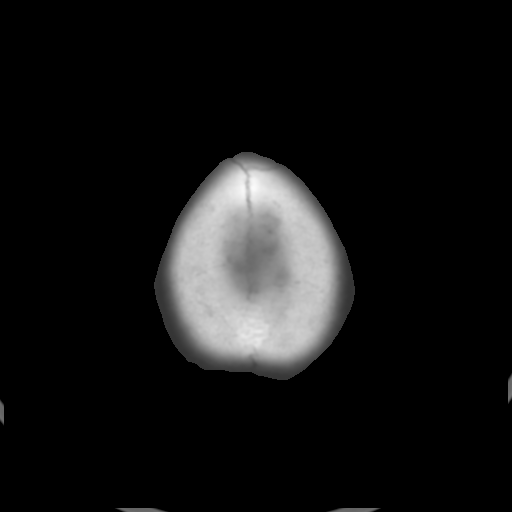

[Series 202: head w/o bone, idose (1) · axial · non-contrast · 0.42mm/px · z∈[+76,+96]mm · 2 of 29 slices shown]
[im 3/29  bone]
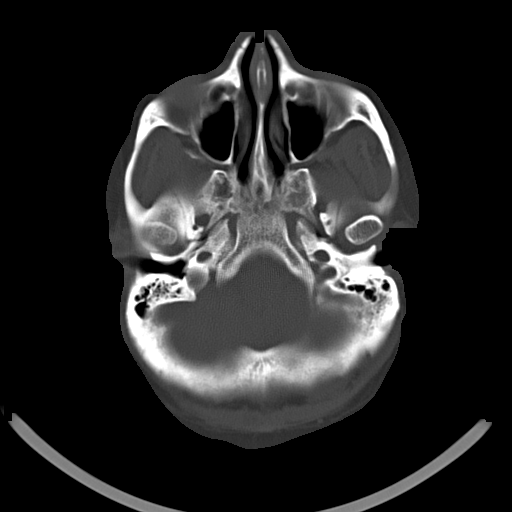
[im 7/29  bone]
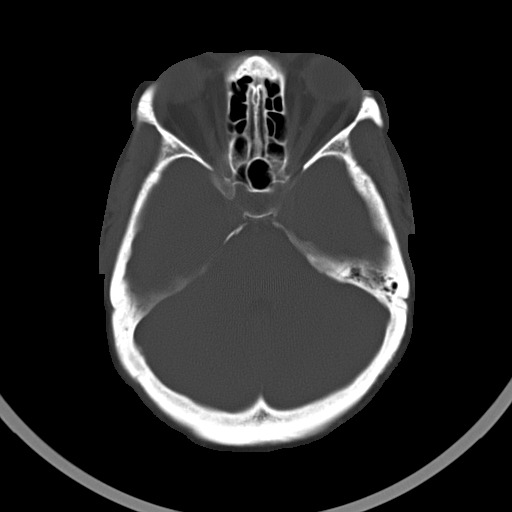

[15 of 30 positions shown; findings below may reference images not displayed]

FINDINGS: LEFT hemisphere Gyriform hyperattenuation over the insula, frontal
greater than temporal operculum, and parietal lobe corresponds to
the area of restricted diffusion on prior MR, and is favored to
represent luxury perfusion within an area of subacute ischemia. No
lobar hematoma is observed and there is no subarachnoid blood. No CT
signs of large vessel occlusion despite severe LEFT MCA M2 stenosis
detected previously.

No midline shift. No abnormal findings on the RIGHT or in the
posterior fossa. Calvarium intact. No sinus or mastoid disease.
IMPRESSION: LEFT hemisphere gyriform hyperattenuation favored to represent
luxury perfusion. This corresponds to the area previously documented
ischemia, without areas of definite new infarction.

Critical Value/emergent results were called by telephone at the time
of interpretation on 06/09/2015 at [DATE] to Dr. Pollo, who
verbally acknowledged these results.

## 2017-03-26 ENCOUNTER — Emergency Department (HOSPITAL_COMMUNITY): Payer: Medicare HMO

## 2017-03-26 ENCOUNTER — Encounter (HOSPITAL_COMMUNITY): Payer: Self-pay | Admitting: Nurse Practitioner

## 2017-03-26 DIAGNOSIS — I10 Essential (primary) hypertension: Secondary | ICD-10-CM | POA: Diagnosis not present

## 2017-03-26 DIAGNOSIS — Z86718 Personal history of other venous thrombosis and embolism: Secondary | ICD-10-CM | POA: Insufficient documentation

## 2017-03-26 DIAGNOSIS — F17210 Nicotine dependence, cigarettes, uncomplicated: Secondary | ICD-10-CM | POA: Diagnosis not present

## 2017-03-26 DIAGNOSIS — Z79899 Other long term (current) drug therapy: Secondary | ICD-10-CM | POA: Insufficient documentation

## 2017-03-26 DIAGNOSIS — R51 Headache: Secondary | ICD-10-CM | POA: Insufficient documentation

## 2017-03-26 DIAGNOSIS — J449 Chronic obstructive pulmonary disease, unspecified: Secondary | ICD-10-CM | POA: Diagnosis not present

## 2017-03-26 DIAGNOSIS — R4701 Aphasia: Secondary | ICD-10-CM | POA: Insufficient documentation

## 2017-03-26 DIAGNOSIS — R200 Anesthesia of skin: Secondary | ICD-10-CM | POA: Insufficient documentation

## 2017-03-26 DIAGNOSIS — Z8673 Personal history of transient ischemic attack (TIA), and cerebral infarction without residual deficits: Secondary | ICD-10-CM | POA: Insufficient documentation

## 2017-03-26 DIAGNOSIS — R531 Weakness: Secondary | ICD-10-CM | POA: Diagnosis not present

## 2017-03-26 DIAGNOSIS — R600 Localized edema: Secondary | ICD-10-CM | POA: Diagnosis not present

## 2017-03-26 DIAGNOSIS — Z7901 Long term (current) use of anticoagulants: Secondary | ICD-10-CM | POA: Insufficient documentation

## 2017-03-26 LAB — CBC
HCT: 41.5 % (ref 36.0–46.0)
HEMOGLOBIN: 13.5 g/dL (ref 12.0–15.0)
MCH: 31.9 pg (ref 26.0–34.0)
MCHC: 32.5 g/dL (ref 30.0–36.0)
MCV: 98.1 fL (ref 78.0–100.0)
PLATELETS: 164 10*3/uL (ref 150–400)
RBC: 4.23 MIL/uL (ref 3.87–5.11)
RDW: 14.1 % (ref 11.5–15.5)
WBC: 5.6 10*3/uL (ref 4.0–10.5)

## 2017-03-26 LAB — I-STAT CHEM 8, ED
BUN: 10 mg/dL (ref 6–20)
CHLORIDE: 102 mmol/L (ref 101–111)
Calcium, Ion: 1.08 mmol/L — ABNORMAL LOW (ref 1.15–1.40)
Creatinine, Ser: 0.7 mg/dL (ref 0.44–1.00)
Glucose, Bld: 106 mg/dL — ABNORMAL HIGH (ref 65–99)
HEMATOCRIT: 43 % (ref 36.0–46.0)
Hemoglobin: 14.6 g/dL (ref 12.0–15.0)
POTASSIUM: 3.4 mmol/L — AB (ref 3.5–5.1)
SODIUM: 141 mmol/L (ref 135–145)
TCO2: 27 mmol/L (ref 22–32)

## 2017-03-26 LAB — DIFFERENTIAL
BASOS ABS: 0 10*3/uL (ref 0.0–0.1)
Basophils Relative: 0 %
EOS ABS: 0.1 10*3/uL (ref 0.0–0.7)
EOS PCT: 2 %
Lymphocytes Relative: 54 %
Lymphs Abs: 3 10*3/uL (ref 0.7–4.0)
Monocytes Absolute: 0.4 10*3/uL (ref 0.1–1.0)
Monocytes Relative: 7 %
NEUTROS PCT: 37 %
Neutro Abs: 2.1 10*3/uL (ref 1.7–7.7)

## 2017-03-26 LAB — COMPREHENSIVE METABOLIC PANEL
ALBUMIN: 3.5 g/dL (ref 3.5–5.0)
ALT: 11 U/L — ABNORMAL LOW (ref 14–54)
ANION GAP: 8 (ref 5–15)
AST: 16 U/L (ref 15–41)
Alkaline Phosphatase: 68 U/L (ref 38–126)
BUN: 8 mg/dL (ref 6–20)
CALCIUM: 8.9 mg/dL (ref 8.9–10.3)
CHLORIDE: 105 mmol/L (ref 101–111)
CO2: 26 mmol/L (ref 22–32)
Creatinine, Ser: 0.74 mg/dL (ref 0.44–1.00)
GFR calc Af Amer: >60 mL/min (ref >60)
GFR calc non Af Amer: >60 mL/min (ref >60)
Glucose, Bld: 106 mg/dL — ABNORMAL HIGH (ref 65–99)
POTASSIUM: 3.4 mmol/L — AB (ref 3.5–5.1)
SODIUM: 139 mmol/L (ref 135–145)
Total Bilirubin: 0.6 mg/dL (ref 0.3–1.2)
Total Protein: 6.7 g/dL (ref 6.5–8.1)

## 2017-03-26 LAB — I-STAT TROPONIN, ED: Troponin i, poc: 0.01 ng/mL (ref 0.00–0.08)

## 2017-03-26 LAB — APTT: aPTT: 31 seconds (ref 24–36)

## 2017-03-26 LAB — PROTIME-INR
INR: 1.09
Prothrombin Time: 14 seconds (ref 11.4–15.2)

## 2017-03-26 NOTE — ED Triage Notes (Signed)
Per EMS pt endorses increasing numbness and pain to right side of face and body ongoing for 3 days. Pt endorses a few falls in the past few days able to get herself up on her own. Pt has history CVA with speech deficits and right sided weakness. Pt ambulatory for EMS, right arm and leg weakness- pt sts is baseline.

## 2017-03-27 ENCOUNTER — Emergency Department (HOSPITAL_COMMUNITY): Payer: Medicare HMO

## 2017-03-27 ENCOUNTER — Encounter (HOSPITAL_COMMUNITY): Payer: Self-pay | Admitting: *Deleted

## 2017-03-27 ENCOUNTER — Emergency Department (HOSPITAL_COMMUNITY)
Admission: EM | Admit: 2017-03-27 | Discharge: 2017-03-27 | Disposition: A | Discharge status: Home / Self Care | Payer: Medicare HMO | Attending: Emergency Medicine | Admitting: Emergency Medicine

## 2017-03-27 DIAGNOSIS — R220 Localized swelling, mass and lump, head: Secondary | ICD-10-CM

## 2017-03-27 DIAGNOSIS — R51 Headache: Secondary | ICD-10-CM

## 2017-03-27 DIAGNOSIS — R519 Headache, unspecified: Secondary | ICD-10-CM

## 2017-03-27 DIAGNOSIS — R202 Paresthesia of skin: Secondary | ICD-10-CM

## 2017-03-27 LAB — I-STAT CG4 LACTIC ACID, ED: Lactic Acid, Venous: 1.44 mmol/L (ref 0.5–1.9)

## 2017-03-27 MED ORDER — IOPAMIDOL (ISOVUE-300) INJECTION 61%
INTRAVENOUS | Status: AC
  Administered 2017-03-27: 75 mL
  Filled 2017-03-27: qty 75

## 2017-03-27 MED ORDER — POTASSIUM CHLORIDE CRYS ER 20 MEQ PO TBCR
40.0000 meq | EXTENDED_RELEASE_TABLET | Freq: Once | ORAL | Status: AC
  Administered 2017-03-27: 40 meq via ORAL
  Filled 2017-03-27: qty 2

## 2017-03-27 MED ORDER — MORPHINE SULFATE (PF) 4 MG/ML IV SOLN
4.0000 mg | Freq: Once | INTRAVENOUS | Status: AC
  Administered 2017-03-27: 4 mg via INTRAVENOUS
  Filled 2017-03-27: qty 1

## 2017-03-27 MED ORDER — AMOXICILLIN 500 MG PO CAPS: 500.0000 mg | ORAL_CAPSULE | Freq: Three times a day (TID) | ORAL | 0 refills | Status: DC

## 2017-03-27 MED ORDER — LORAZEPAM 2 MG/ML IJ SOLN
1.0000 mg | Freq: Once | INTRAMUSCULAR | Status: AC
  Administered 2017-03-27: 1 mg via INTRAVENOUS
  Filled 2017-03-27: qty 1

## 2017-03-27 MED ORDER — MORPHINE SULFATE (PF) 4 MG/ML IV SOLN
2.0000 mg | Freq: Once | INTRAVENOUS | Status: AC
  Administered 2017-03-27: 2 mg via INTRAVENOUS
  Filled 2017-03-27: qty 1

## 2017-03-27 NOTE — ED Notes (Signed)
pts daughter now here in the ER lobby talking to nurse first with voice raised saying "I just got off the phone with a nurse who was very rude to me and I dont appreaciate it! I want to know why my mother is still here in the waiting room and has not been seen by a doctor! Why has she not been offered any water since shes been here?! Wouldn't you think shes thirsty?!" RN explained that staff does not usually offer drinks to patients who have not been seen by a doctor. Daughter walked over to vending machines to purchase a drink

## 2017-03-27 NOTE — ED Notes (Signed)
Pt approaching nurse reporting R facial pain and requested warm coffee, RN encouraged her to remain NPO and offered heat pack instead; pt verbalized understanding and returned to seat in lobby

## 2017-03-27 NOTE — ED Notes (Signed)
Pt given 2nd heat pack

## 2017-03-27 NOTE — ED Notes (Signed)
Pt approaching multiple staff members about an IV started by EMS that is painful; RN offered to remove IV but warned that she may need another when she gets seen by provider, pt verbalized understanding

## 2017-03-27 NOTE — Discharge Instructions (Signed)
It was our pleasure to provide your ER care today - we hope that you feel better.  Fall precautions.  Try using walker to help with stability.  Take antibiotic as prescribed.  Icepack/cold to area.   Take acetaminophen and/or ibuprofen as need.   From today's labs, your potassium level is mildly low (3.4) - eat plenty of fruits and vegetables, and follow up with your doctor.  Follow up with your doctor in 1 week.  Return to ER if worse, new symptoms, fevers, new weakness, other concern.

## 2017-03-27 NOTE — ED Provider Notes (Signed)
[facility] EMERGENCY DEPARTMENT Provider Note   CSN: [id number] Arrival date & time: 03/26/17  2110     History   Chief Complaint Worsening numbness of right side of face and right arm and leg.  HPI Brooke [name] is a 61 y.o. female.  The history is provided by the patient and a relative.  She had a stroke 2 years ago with residual right sided weakness and numbness and speech problem.  She had onset yesterday of increased numbness and weakness over her baseline.  Her daughter noted some swelling in her face.  She is complaining of pain in her face and in the right arm.  She rates pain at 8/10.  There has been no fever, chills, sweats.  There is been no nausea or vomiting.  She denies any chest pain.  She does not endorse having had several falls in the last several weeks.  Past Medical History:  Diagnosis Date  . Anxiety   . Aortic stenosis    a. mild by echo 05/2015.  . Arthritis   . Asthma   . Bilateral pulmonary embolism ([place]) 02/2014   a. PCCM recommended lifelong anticoagulation.  . Chronic back pain   . Chronic bronchitis   . Chronic pain   . Colon cancer ([place])    a. s/p surgery, chemotherapy.  Marland Kitchen COPD (chronic obstructive pulmonary disease) ([place])   . Depression   . DVT (deep venous thrombosis) ([place]) 02/2014  . Dyslipidemia   . GERD (gastroesophageal reflux disease)   . Hypertension   . Migraine headache   . Morbid obesity ([place])   . Obstructive sleep apnea    mild  . Osteoarthritis   . Ovarian cyst   . PVC's (premature ventricular contractions) 2013  . Stroke ([place])   . Tobacco abuse   . Uterine fibroid     Patient Active Problem List   Diagnosis Date Noted  . COPD exacerbation ([place]) 08/10/2016  . Acute respiratory failure ([place]) 08/08/2016  . Acute bronchitis 08/08/2016  . Neuropathy 10/29/2015  . Seizures ([place]) 06/20/2015  . Expressive aphasia   . 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 Stroke with  cerebral ischemia ([place])   . Chronic obstructive pulmonary disease ([place])   . Hx of migraines   . OSA (obstructive sleep apnea)   . Hemiparesis, aphasia, and dysphagia as late effect of cerebrovascular accident (CVA) ([place])   . Elevated troponin 05/28/2015  . Essential hypertension 05/28/2015  . Hyperkalemia 05/28/2015  . History of pulmonary embolism 05/28/2015  . History of DVT (deep vein thrombosis) 05/28/2015  . Global aphasia   . Aphasia   . Weakness   . Stroke ([place]) 05/26/2015  . Acute tonsillitis 06/25/2014  . Respiratory failure ([place]) 02/23/2014  . Asthma, chronic 02/08/2014  . Obstructive sleep apnea 02/08/2014  . GERD (gastroesophageal reflux disease) 09/28/2013  . Chest pain 04/19/2013  . Obesity, Class III, BMI 40-49.9 (morbid obesity) ([place]) 08/23/2012  . Constipation 08/20/2012  . Headache(784.0) 08/20/2012  . Chronic pain syndrome 08/19/2012  . Hyperglycemia 08/19/2012  . Insomnia 08/19/2012  . Hypokalemia 10/21/2011  . Tobacco abuse 10/21/2011  . Arthritis 10/21/2011  . Anxiety 10/21/2011  . Depression 10/21/2011  . Esophageal reflux 07/30/2011  . Diarrhea following gastrointestinal surgery 07/30/2011  . PVC (premature ventricular contraction) 07/10/2011  . Morbid obesity ([place])   . Hypertension   . Dyslipidemia   . 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 CARPAL TUNNEL RELEASE     rt  . CESAREAN SECTION    . COLONOSCOPY    . KNEE ARTHROSCOPY     bil.  Marland Kitchen MOUTH SURGERY     teeth extraction  . SUBTOTAL COLECTOMY    . TUBAL LIGATION      OB History    Gravida Para Term Preterm AB Living   2 2 2     2    SAB TAB Ectopic Multiple Live Births                   Home Medications    Prior to Admission medications   Medication Sig Start Date End Date Taking? Authorizing Provider  albuterol (PROVENTIL HFA;VENTOLIN HFA) 108 (90 Base) MCG/ACT  inhaler Inhale 2 puffs into the lungs every 6 (six) hours as needed for wheezing or shortness of breath (shortness of breath). 10/29/15  Yes Arnoldo Morale, MD  amitriptyline (ELAVIL) 50 MG tablet Take 50 mg by mouth at bedtime.   Yes [provider]  atorvastatin (LIPITOR) 40 MG tablet TAKE 1 TABLET DAILY AT 6 PM 01/22/16  Yes Amao, Charlane Ferretti, MD  hydrochlorothiazide (HYDRODIURIL) 12.5 MG tablet Take 12.5 mg by mouth daily. 02/24/17  Yes [provider]  levETIRAcetam (KEPPRA) 500 MG tablet TAKE 1 TABLET TWICE DAILY 01/22/16  Yes Amao, Enobong, MD  LORazepam (ATIVAN) 1 MG tablet Take 1 tablet by mouth daily as needed for anxiety.  11/07/15  Yes [provider]  omeprazole (PRILOSEC) 40 MG capsule TAKE 1 CAPSULE EVERY DAY 09/17/15  Yes Amao, Charlane Ferretti, MD  oxyCODONE-acetaminophen (PERCOCET) 7.5-325 MG tablet Take 1 tablet by mouth every 8 (eight) hours as needed for severe pain.   Yes [provider]  potassium chloride SA (K-DUR,KLOR-CON) 20 MEQ tablet Take 20 mEq by mouth daily. 09/17/15  Yes [provider]  rivaroxaban (XARELTO) 20 MG TABS tablet TAKE ONE TABLET BY MOUTH ONCE DAILY IN THE EVENING WITH SUPPER 10/29/15  Yes Amao, Charlane Ferretti, MD  azithromycin (ZITHROMAX) 250 MG tablet TAKE 1 TAB DAILY FOR 4 DAYS Patient not taking: Reported on 08/08/2016 11/23/15   Isla Pence, MD  citalopram (CELEXA) 20 MG tablet Take 1 tablet (20 mg total) by mouth daily. Patient not taking: Reported on 03/27/2017 10/29/15   Arnoldo Morale, MD  gabapentin (NEURONTIN) 300 MG capsule TAKE 2 CAPSULES TWICE DAILY Patient not taking: Reported on 03/27/2017 02/26/16   Arnoldo Morale, MD  guaiFENesin (MUCINEX) 600 MG 12 hr tablet Take 1 tablet (600 mg total) by mouth 2 (two) times daily. Patient not taking: Reported on 03/27/2017 08/10/16   Roxan Hockey, MD  HYDROcodone-acetaminophen (NORCO/VICODIN) 5-325 MG tablet Take 1 tablet by mouth every 4 (four) hours as needed. Patient not taking:  Reported on 08/08/2016 11/23/15   Isla Pence, MD  hydrOXYzine (ATARAX/VISTARIL) 10 MG tablet Take 1 tablet (10 mg total) by mouth 3 (three) times daily as needed. Patient not taking: Reported on 08/08/2016 10/29/15   Arnoldo Morale, MD  lisinopril (PRINIVIL,ZESTRIL) 10 MG tablet Take 1 tablet (10 mg total) by mouth daily. Patient not taking: Reported on 03/27/2017 08/10/16   Roxan Hockey, MD  loratadine (CLARITIN) 10 MG tablet Take 1 tablet (10 mg total) by mouth daily. Patient not taking: Reported on 08/08/2016 11/23/15   Isla Pence, MD  nicotine (NICODERM CQ - DOSED IN MG/24 HOURS) 14 mg/24hr patch Place 1 patch (14 mg total) onto the skin  daily. Patient not taking: Reported on 03/27/2017 08/11/16   Roxan Hockey, MD  predniSONE (DELTASONE) 20 MG tablet Take 1 tablet (20 mg total) by mouth daily with breakfast. Patient not taking: Reported on 03/27/2017 08/10/16   Roxan Hockey, MD  traZODone (DESYREL) 150 MG tablet Take 1 tablet (150 mg total) by mouth at bedtime. Patient not taking: Reported on 03/27/2017 08/10/16   Roxan Hockey, MD    Family History Family History  Problem Relation Age of Onset  . Uterine cancer Mother   . Ovarian cancer Mother   . Stroke Father   . Diabetes Father   . Hypertension Father   . Colon cancer Maternal Uncle   . Breast cancer Maternal Grandmother   . Diabetes Sister   . Asthma Child   . Asthma Child     Social History Social History  Substance Use Topics  . Smoking status: Current Some Day Smoker    Packs/day: 1.00    Years: 37.00    Types: Cigarettes  . Smokeless tobacco: Never Used     Comment: trying to quit, down to .5ppd X2 years ago.   . Alcohol use No     Allergies   Kiwi extract and Aspirin   Review of Systems Review of Systems  All other systems reviewed and are negative.    Physical Exam Updated Vital Signs BP (!) 153/104 (BP Location: Right Arm)   Pulse 87   Temp 98.6 F (37 C) (Oral)   Resp 18   Ht 4\' 11"   (1.499 m)   Wt 101.6 kg (224 lb)   LMP 05/24/2003   SpO2 100%   BMI 45.24 kg/m   Physical Exam  Nursing note and vitals reviewed.  61 year old female, resting comfortably and in no acute distress. Vital signs are significant for hypertension. Oxygen saturation is 100%, which is normal. Head is normocephalic and atraumatic. PERRLA, EOMI. Oropharynx is clear.  There is slight swelling in the right malar area with some slight induration extending from the paranasal area into the malar area.  No erythema or warmth.  This area is mildly to moderately tender. Neck is tender diffusely without adenopathy or JVD. Back is nontender and there is no CVA tenderness. Lungs are clear without rales, wheezes, or rhonchi. Chest is nontender. Heart has regular rate and rhythm without murmur. Abdomen is soft, flat, nontender without masses or hepatosplenomegaly and peristalsis is normoactive. Extremities have no cyanosis or edema, full range of motion is present. Skin is warm and dry without rash. Neurologic: She is awake and alert and oriented, moderate Broca's type aphasia present, cranial nerves are intact.  Strength in the left is 5/5, strength on the right is 3/5.  Markedly decreased pinprick sensation right side of face and right arm and leg. Right Babinski response is present.  ED Treatments / Results  Labs (all labs ordered are listed, but only abnormal results are displayed) Labs Reviewed  COMPREHENSIVE METABOLIC PANEL - Abnormal; Notable for the following:       Result Value   Potassium 3.4 (*)    Glucose, Bld 106 (*)    ALT 11 (*)    All other components within normal limits  I-STAT CHEM 8, ED - Abnormal; Notable for the following:    Potassium 3.4 (*)    Glucose, Bld 106 (*)    Calcium, Ion 1.08 (*)    All other components within normal limits  PROTIME-INR  APTT  CBC  DIFFERENTIAL  SEDIMENTATION RATE  I-STAT TROPONIN, ED  CBG MONITORING, ED  I-STAT CG4 LACTIC ACID, ED    EKG   EKG Interpretation  Date/Time:  Thursday March 26 2017 21:22:10 EDT Ventricular Rate:  93 PR Interval:  138 QRS Duration: 68 QT Interval:  372 QTC Calculation: 462 R Axis:   51 Text Interpretation:  Normal sinus rhythm Normal ECG When compared with ECG of 09/08/2016, Premature ventricular complexes are no longer present Confirmed by Delora Fuel ([id number]) on 03/26/2017 11:31:33 PM       Radiology Ct Head Wo Contrast  Result Date: 03/26/2017 CLINICAL DATA:  Increasing numbness and pain to the right side of face and body for 3 days. A few falls in the past few days. History of stroke. EXAM: CT HEAD WITHOUT CONTRAST TECHNIQUE: Contiguous axial images were obtained from the base of the skull through the vertex without intravenous contrast. COMPARISON:  09/09/2016 FINDINGS: Brain: Large area of encephalomalacia in the left frontoparietal region consistent with old infarct in the distribution of the left middle cerebral artery. Appearances are similar to previous study but there is evidence of some loss of distinction of the anterior margins. This may suggest acute progression of the old infarct. There is no acute intracranial hemorrhage. No mass effect or midline shift. No abnormal extra-axial fluid collections. No ventricular dilatation. Basal cisterns are not effaced. Vascular: No hyperdense vessel or unexpected calcification. Skull: Normal. Negative for fracture or focal lesion. Sinuses/Orbits: No acute finding. Other: None. IMPRESSION: Old infarct in the distribution of the left middle cerebral artery with possible acute progression around the anterior margins. No acute intracranial hemorrhage and no significant mass effect. Electronically Signed   By: Lucienne Capers M.D.   On: 03/26/2017 21:58    Procedures Procedures (including critical care time)  Medications Ordered in ED Medications  morphine 4 MG/ML injection 4 mg (not administered)     Initial Impression / Assessment and Plan / ED  Course  I have reviewed the triage vital signs and the nursing notes.  Pertinent labs & imaging results that were available during my care of the patient were reviewed by me and considered in my medical decision making (see chart for details).  Facial swelling which may represent early cellulitis.  Increase in baseline weakness and numbness which could represent extension of prior stroke.  She does have recent falls.  CT of head was worrisome for possible progression around the anterior margin of old left middle cerebral artery distribution stroke.  She will be sent for CT of maxillofacial area with contrast to look for evidence of cellulitis, sent for CT of cervical spine because of falls and tenderness.  She will be sent for MRI to look for evidence of acute stroke. Case is signed out to Dr. Ashok Cordia.  Final Clinical Impressions(s) / ED Diagnoses   Final diagnoses:  Swelling of right side of face  Paresthesia of right arm and leg  Pain in face    New Prescriptions New Prescriptions   No medications on file     Delora Fuel, MD 26/71/24 [phone]

## 2017-03-27 NOTE — ED Notes (Signed)
Notified by MRI that patient unable to tolerate & requesting antianxiety medication. Notified Dr. Roxanne Mins. Received new order for Ativan 1 mg. Patient currently in CT 2. RN administered medication in CT 2.

## 2017-03-27 NOTE — ED Provider Notes (Signed)
Signed out by Dr Roxanne Mins to d/c to home w po abx if MR neg for acute cva.  MR neg acute. On exam, patient denies any new loss of function, no change in speech or vision, no new weakness.  Neuro exam is c/w baseline. V mild right facial swelling, right posterior molar broken off, mild gum swelling, no trismus.  rx amox for home.      Lajean Saver, MD 03/27/17 [phone]

## 2017-03-27 NOTE — ED Notes (Addendum)
pts "daughter" called nurse first desk asking "why is my mother still sitting out there in [facility] waiting room?!"; RN explained to her that our hospital is very full at this time and we have had to experience longer than usual waits tonight. She responded with "but that dont make no sense, shes been there since 8pm!" RN and daughter continued to dispute patients actual documented arrival time and daughter continued to yell and curse at this nurse, I hung up on this caller.

## 2017-05-27 IMAGING — CR DG CHEST 2V
2 series · 2 of 2 positions shown · non-contrast
Comparison: 08/09/2015

CLINICAL DATA: Cough

EXAM:
CHEST  2 VIEW

[w chest lat]
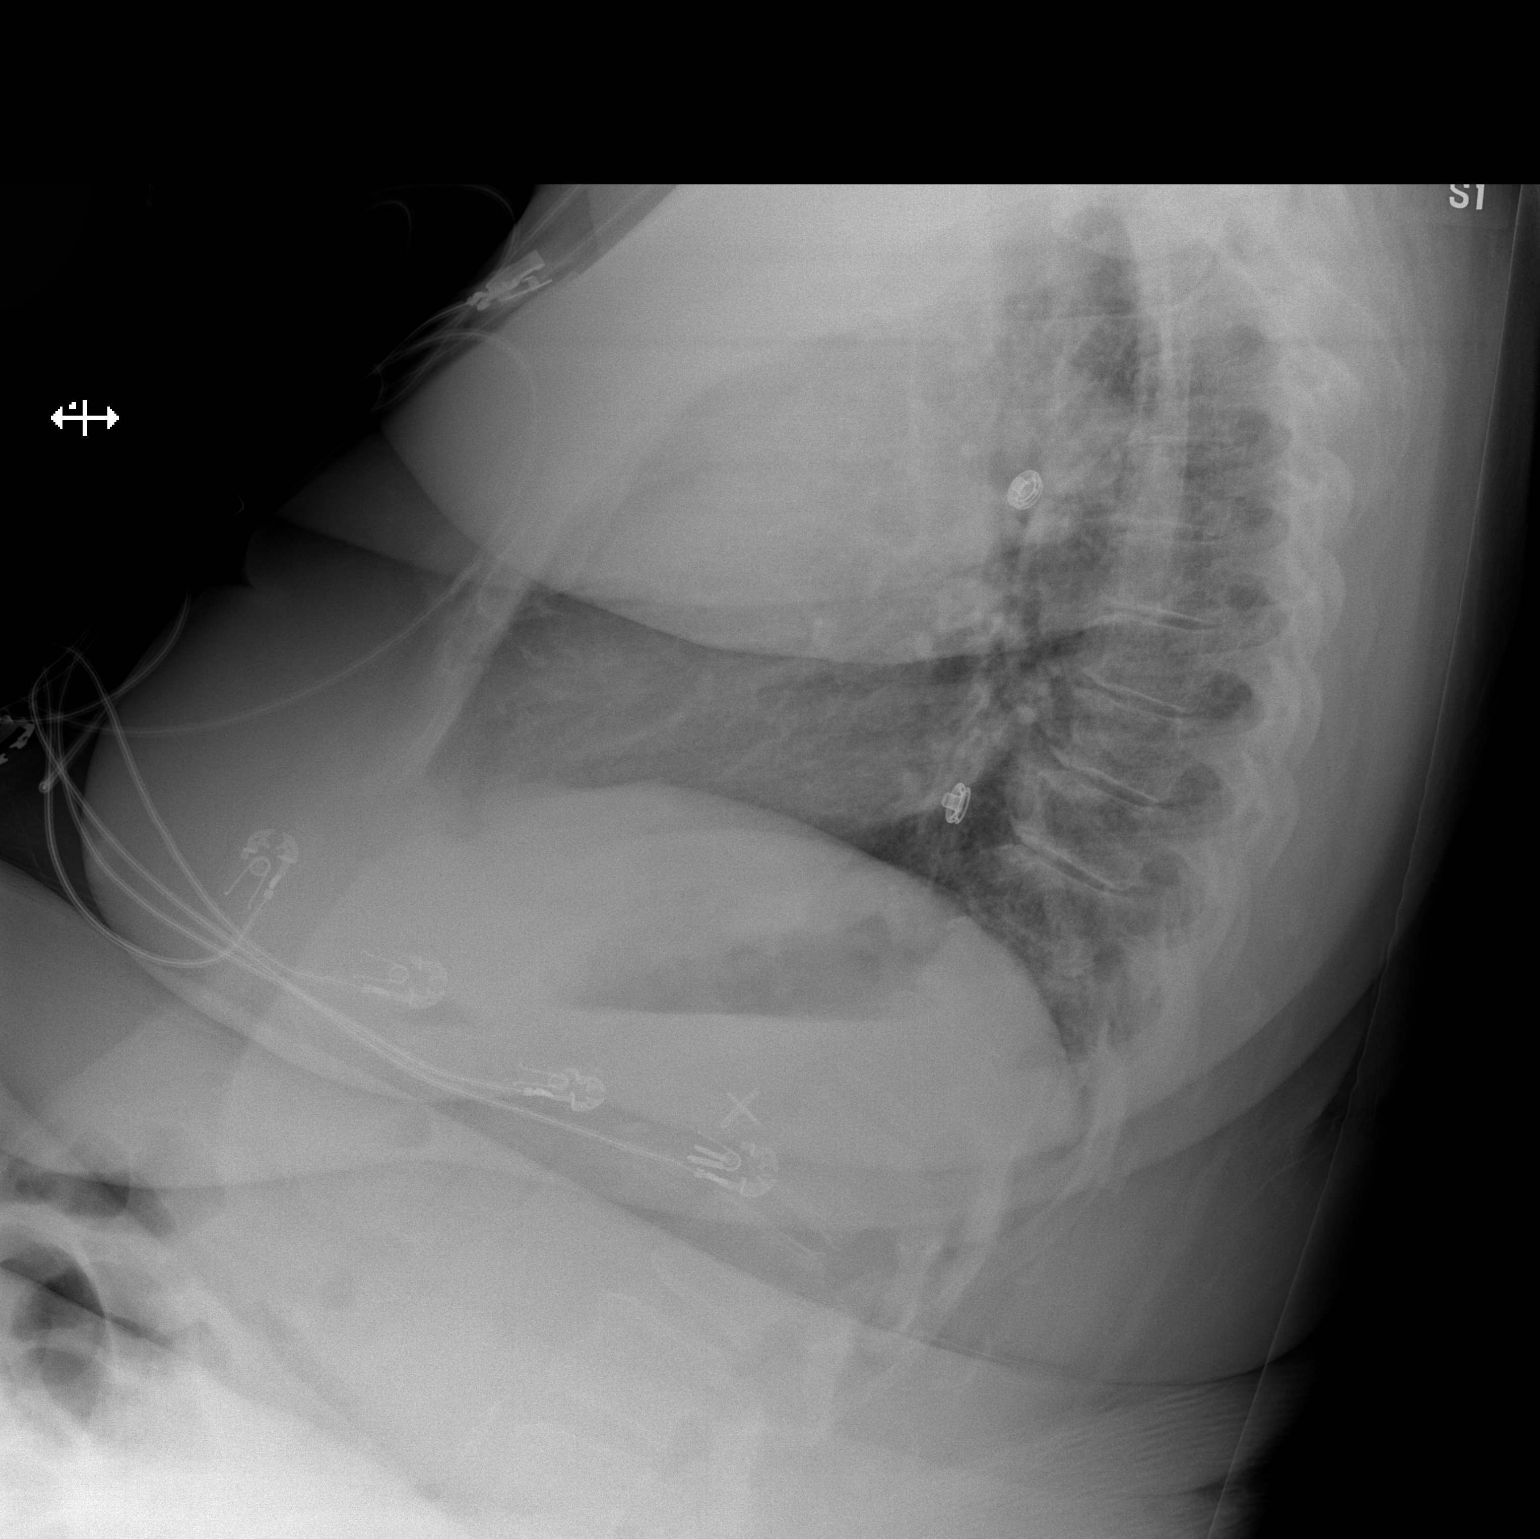

[x chest ap]
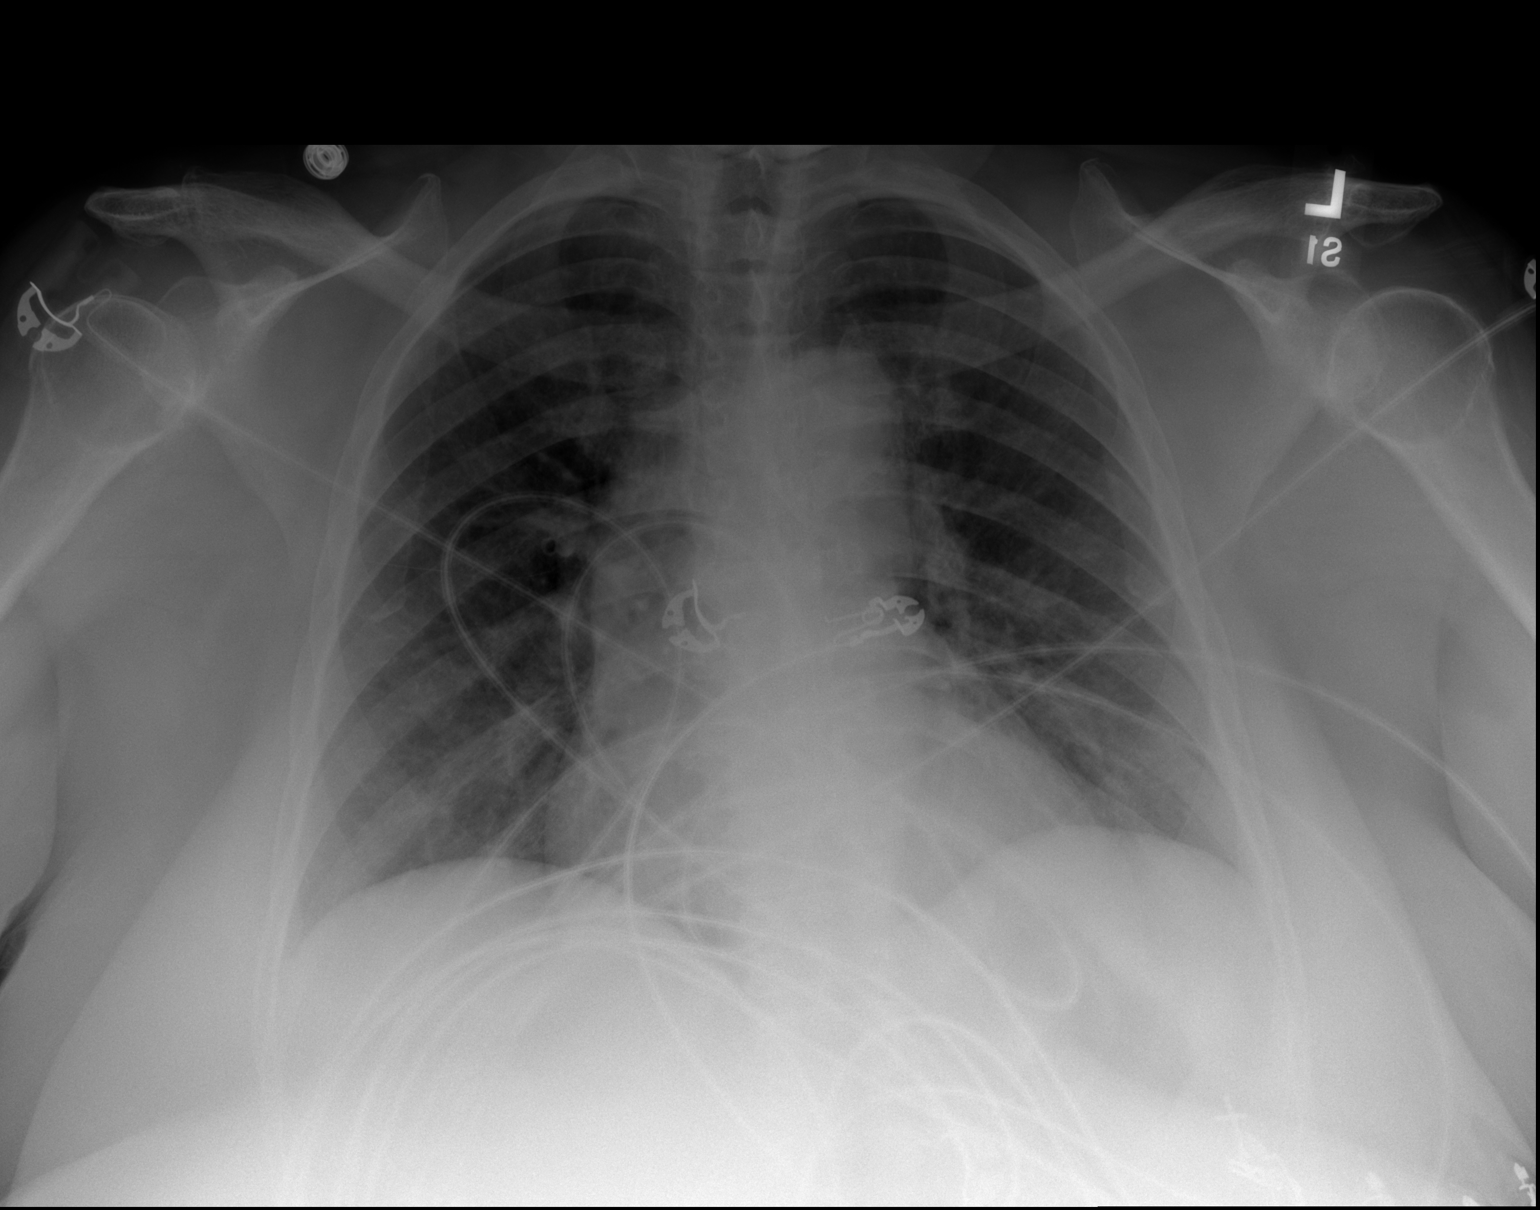

[2 of 2 positions shown; findings below may reference images not displayed]

FINDINGS: Cardiac shadow is at the upper limits of normal in size but stable.
The lungs are well aerated bilaterally. No focal infiltrate or
sizable effusion is seen. Mild degenerative changes of the thoracic
spine are noted.
IMPRESSION: No active cardiopulmonary disease.

## 2017-05-27 IMAGING — CR DG FOREARM 2V*R*
2 series · 2 of 2 positions shown · non-contrast
Comparison: None.

CLINICAL DATA: Right forearm pain, no known injury, initial
encounter

EXAM:
RIGHT FOREARM - 2 VIEW

[x forearm ap right]
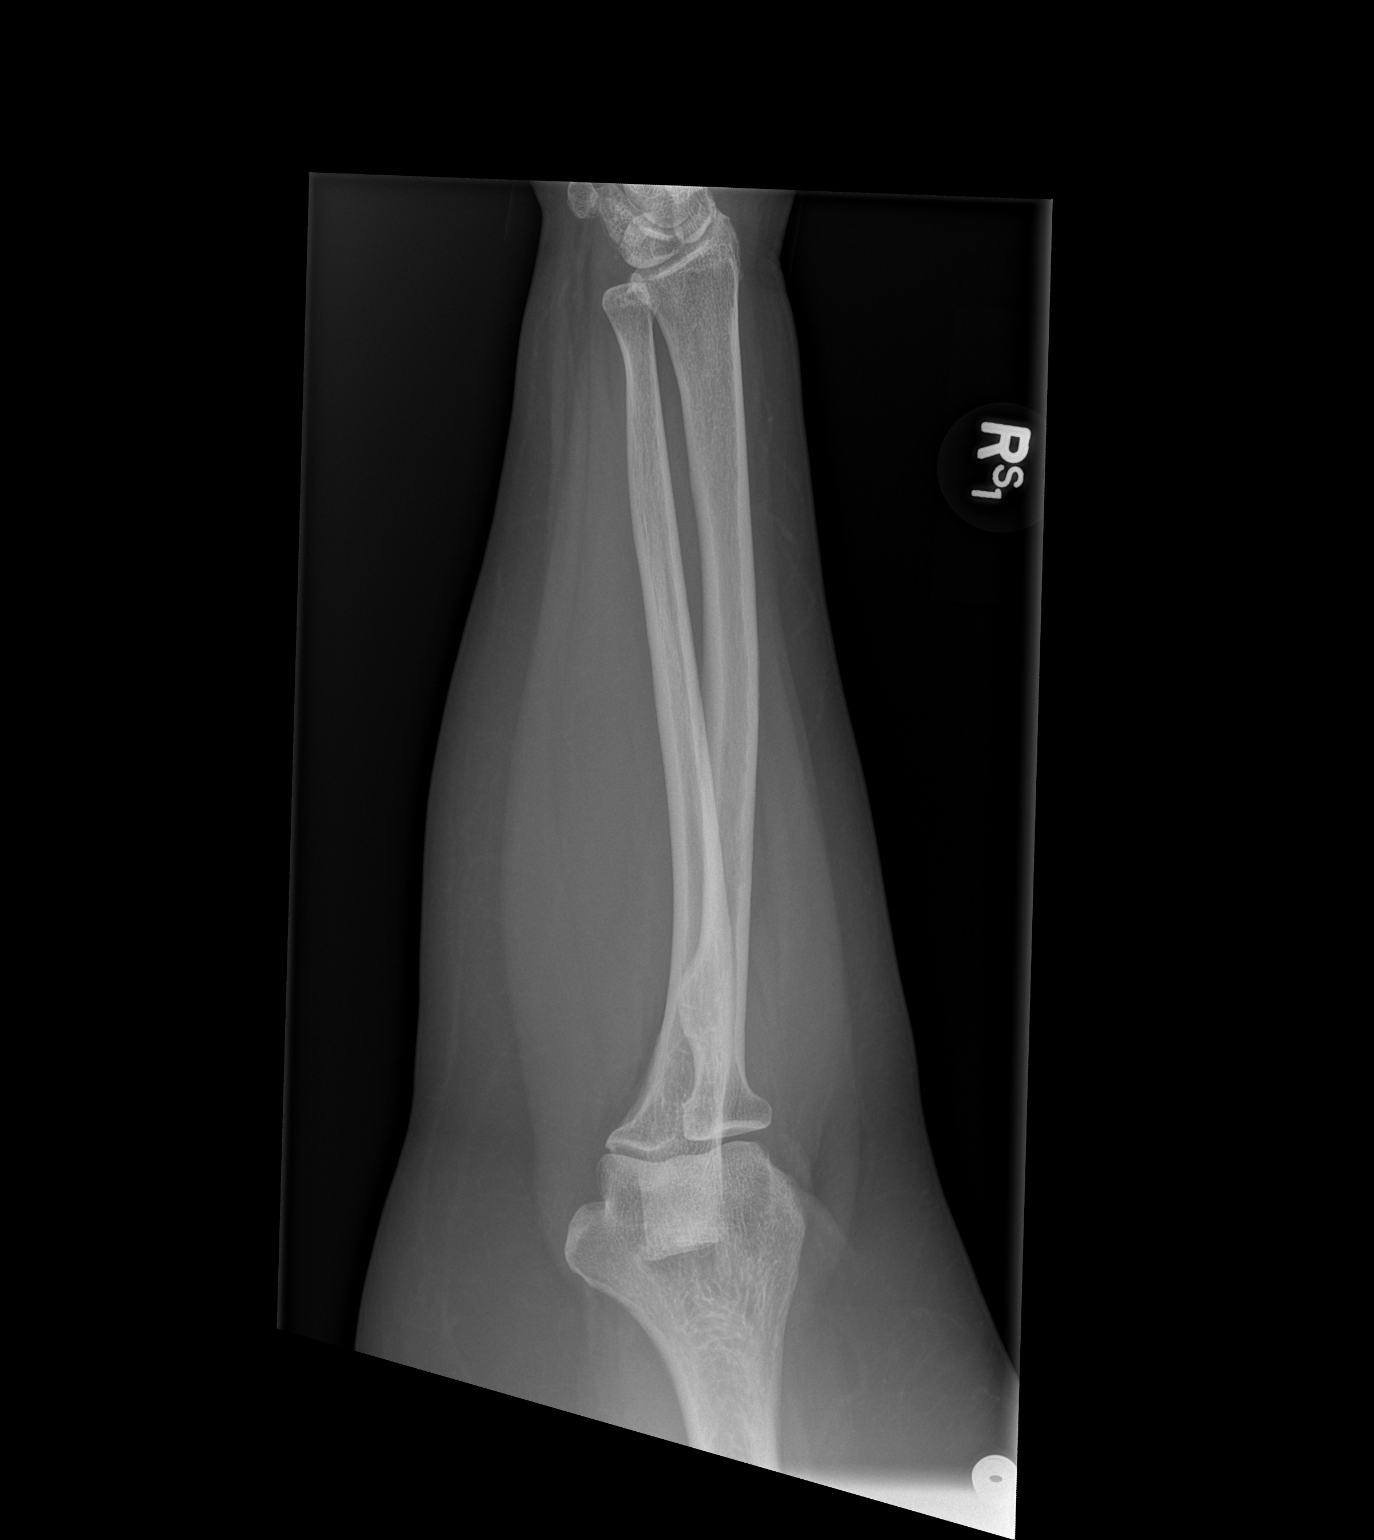

[x forearm lat right]
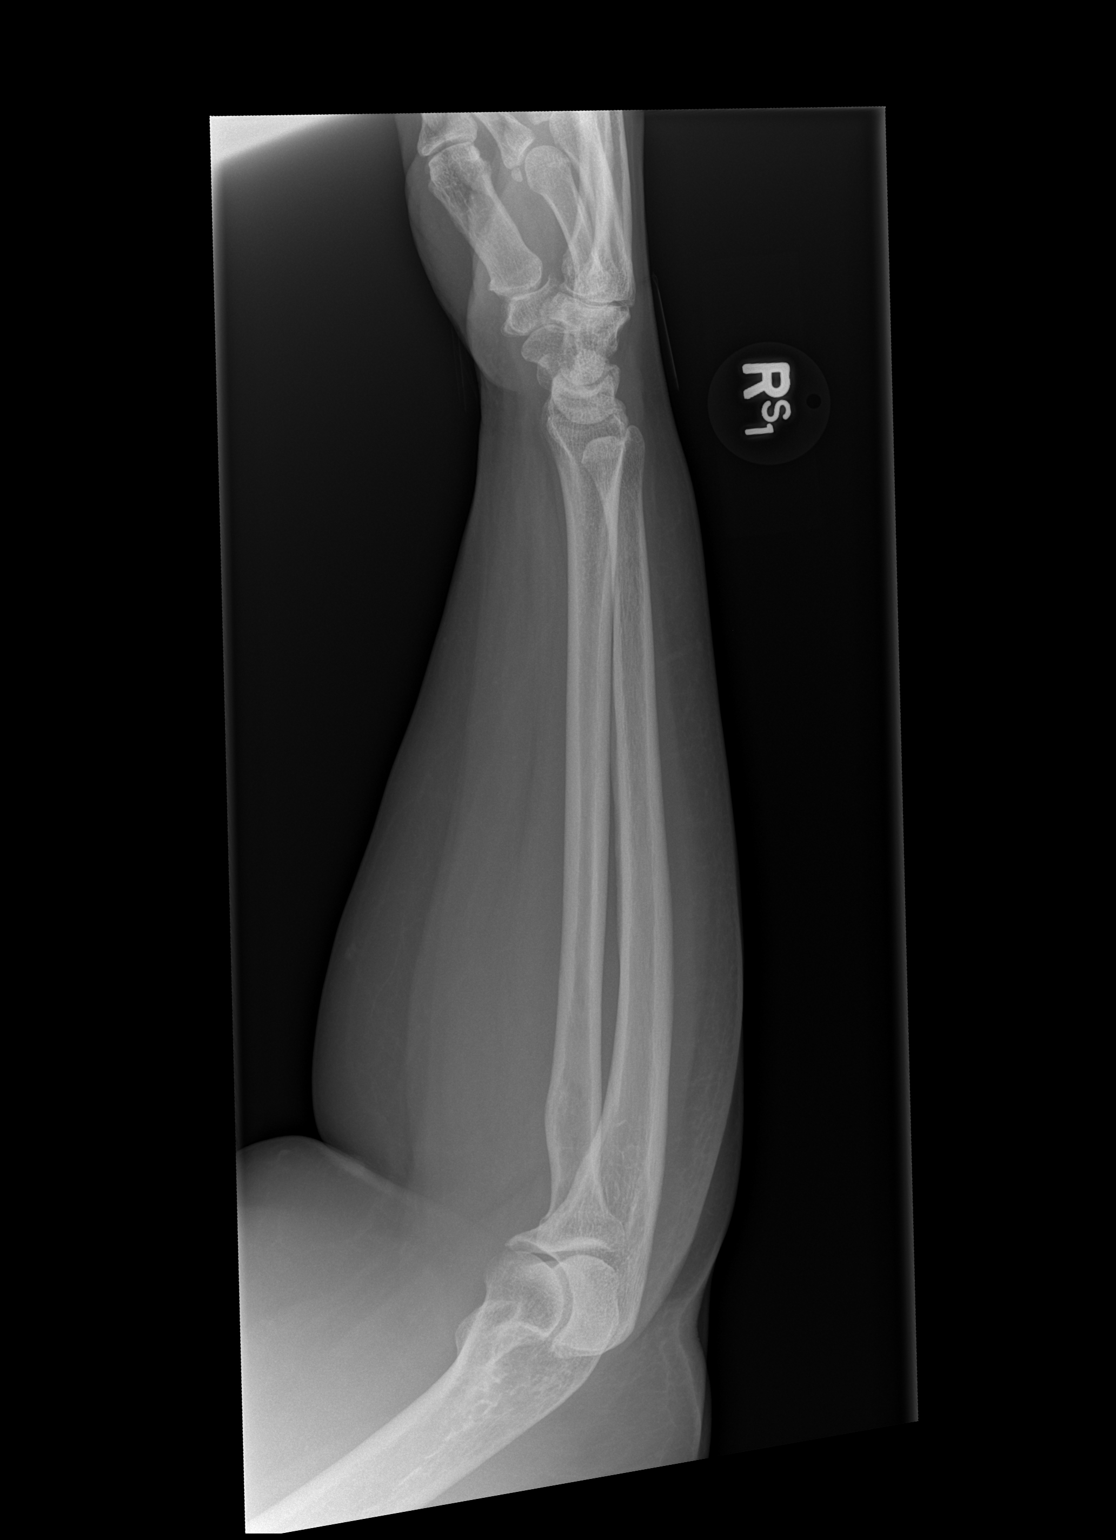

[2 of 2 positions shown; findings below may reference images not displayed]

FINDINGS: There is no evidence of fracture or other focal bone lesions. Soft
tissues are unremarkable.
IMPRESSION: No acute abnormality noted.

## 2017-08-02 IMAGING — CR DG CHEST 2V
2 series · 2 of 2 positions shown · non-contrast
Comparison: 10/15/2015

CLINICAL DATA: Right facial droop and right-sided weakness.

EXAM:
CHEST  2 VIEW

[w chest lat]
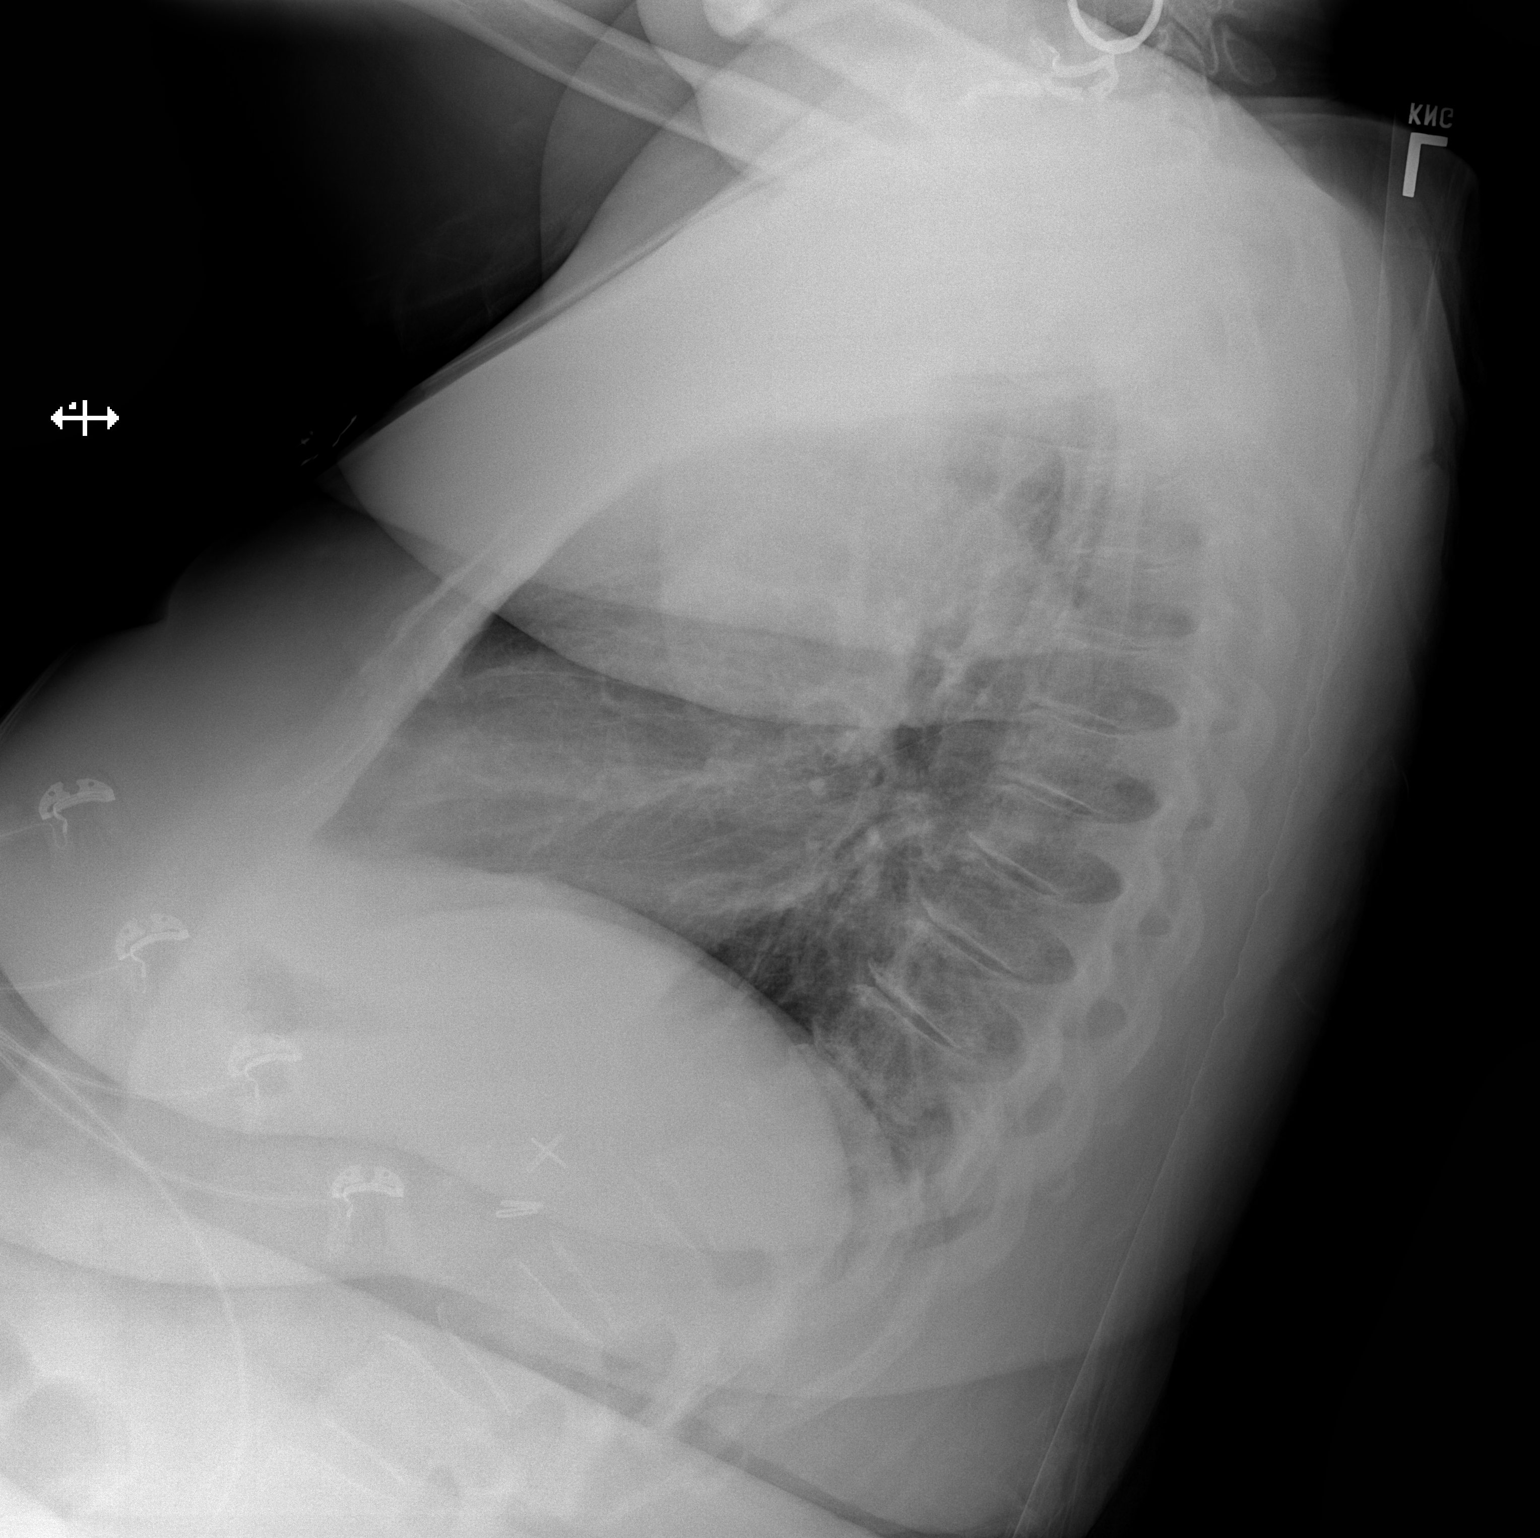

[x chest ap]
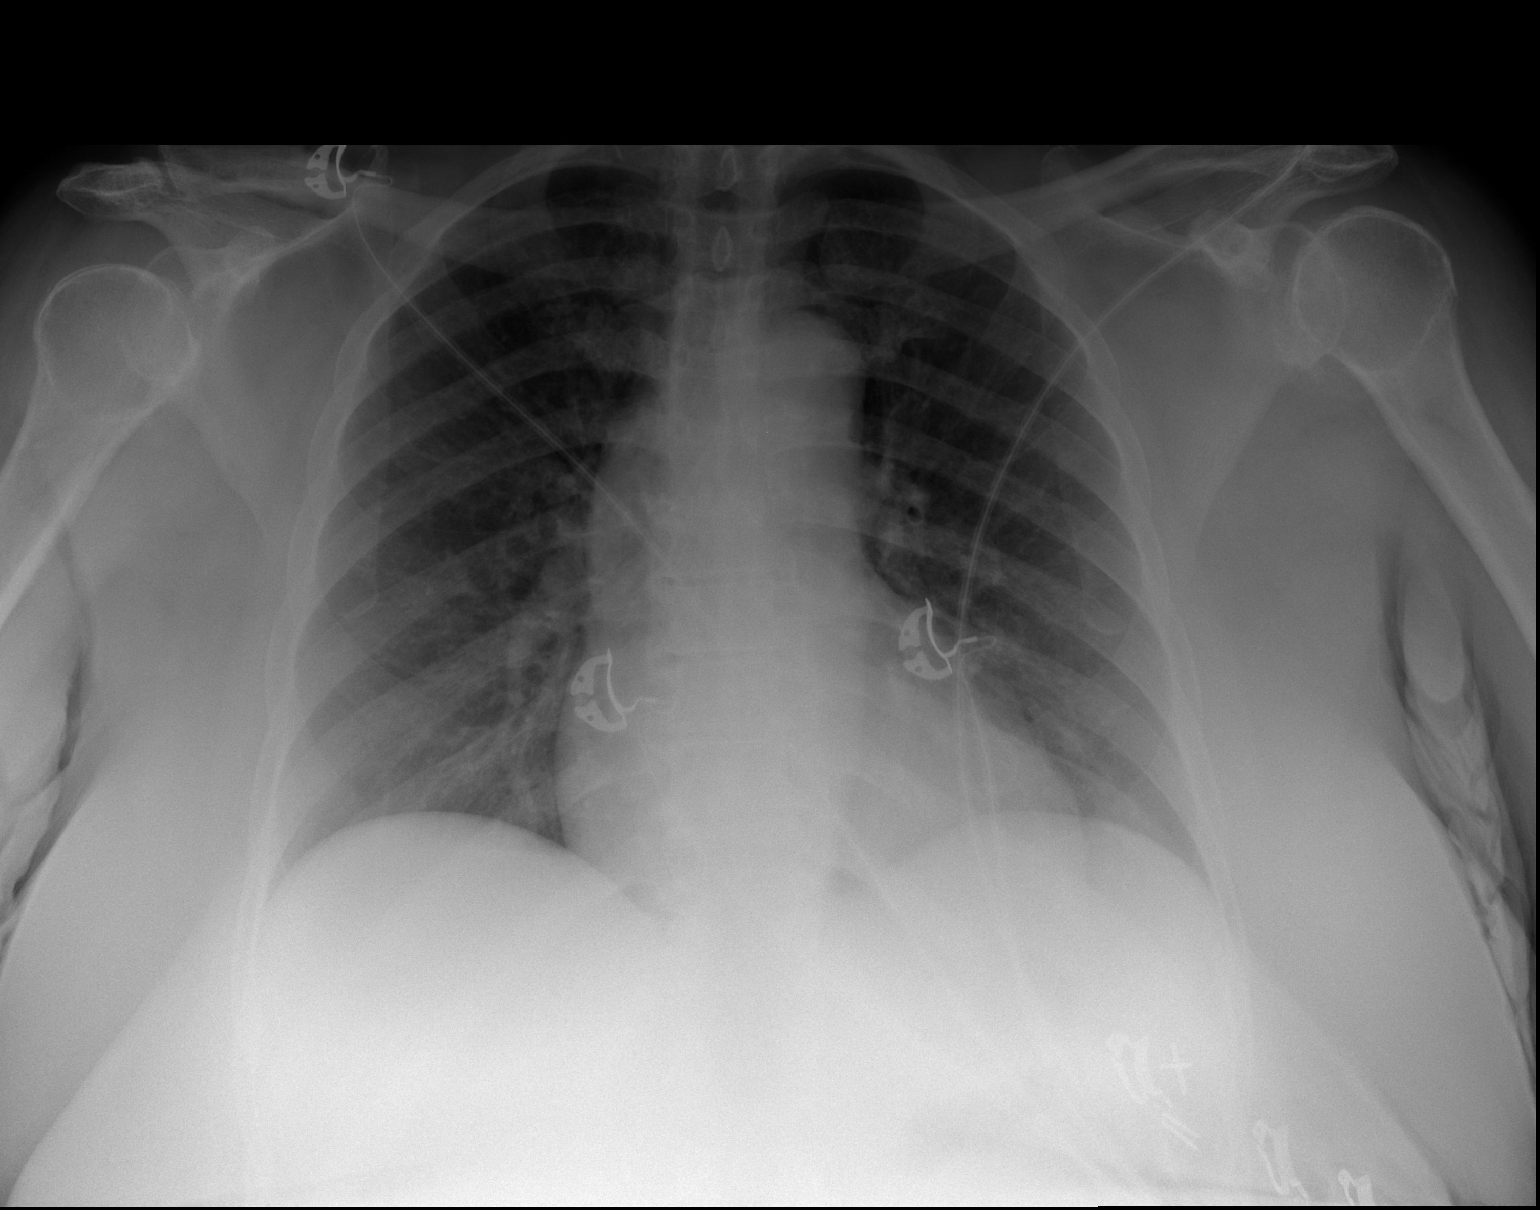

[2 of 2 positions shown; findings below may reference images not displayed]

FINDINGS: Both lungs are clear. Heart and mediastinum are within normal
limits. The trachea is midline. No pleural effusions. No acute bone
abnormality.
IMPRESSION: No active cardiopulmonary disease.

## 2017-08-02 IMAGING — CT CT HEAD W/O CM
3 of 4 series · 15 of 47 positions shown, 18 images · non-contrast
Comparison: CT of the head performed 06/09/2015

CLINICAL DATA: Acute onset of right-sided facial droop and
right-sided weakness. Cough and nasal congestion. Initial encounter.

EXAM:
CT HEAD WITHOUT CONTRAST
TECHNIQUE: Contiguous axial images were obtained from the base of the skull
through the vertex without intravenous contrast.

[Series 2: head w/o · axial · non-contrast · 0.42mm/px · z∈[-106,+29]mm · 9 of 33 slices shown, 12 images]
[im 3/33  brain]
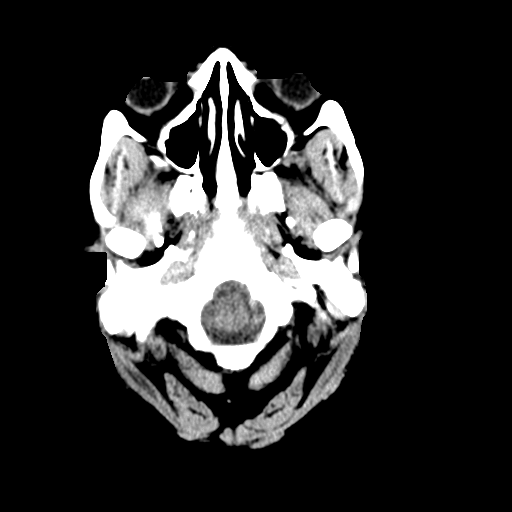
[im 3/33  bone]
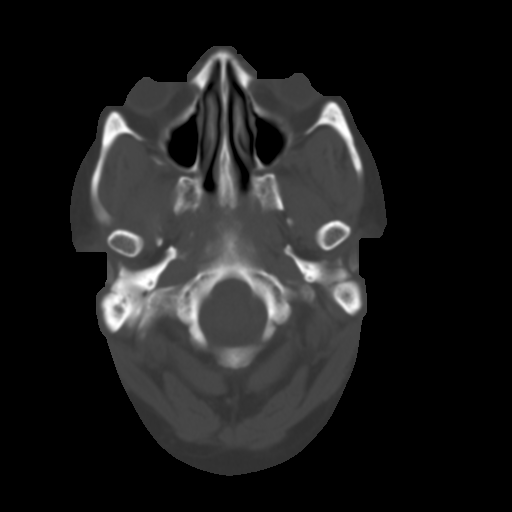
[im 7/33  brain]
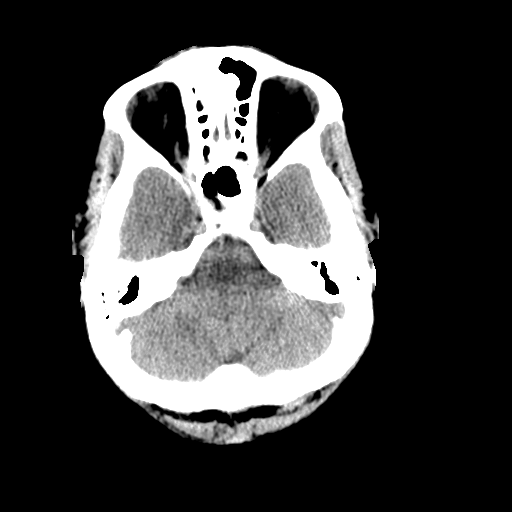
[im 10/33  brain]
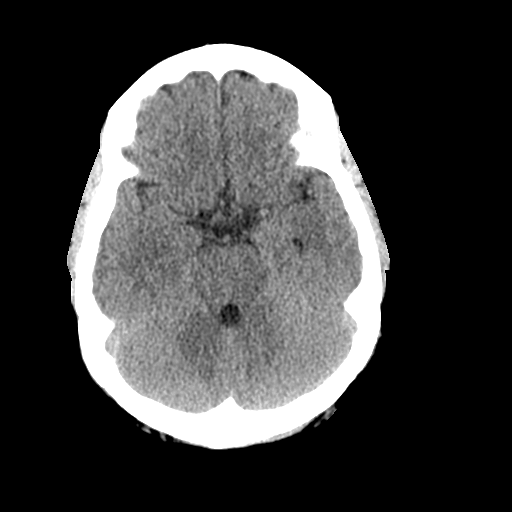
[im 14/33  brain]
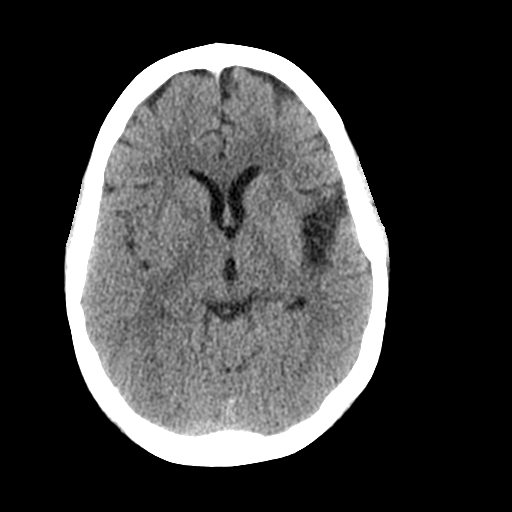
[im 17/33  brain]
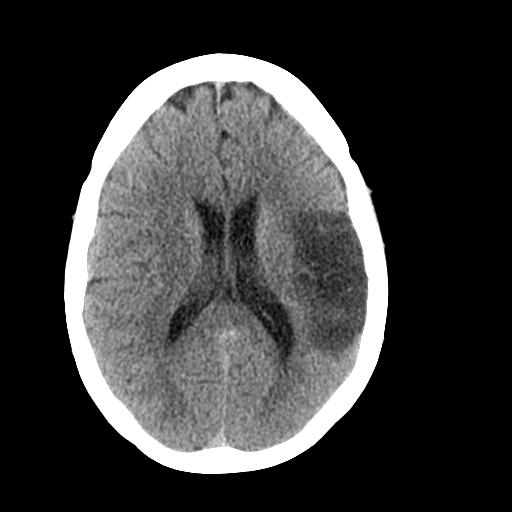
[im 17/33  bone]
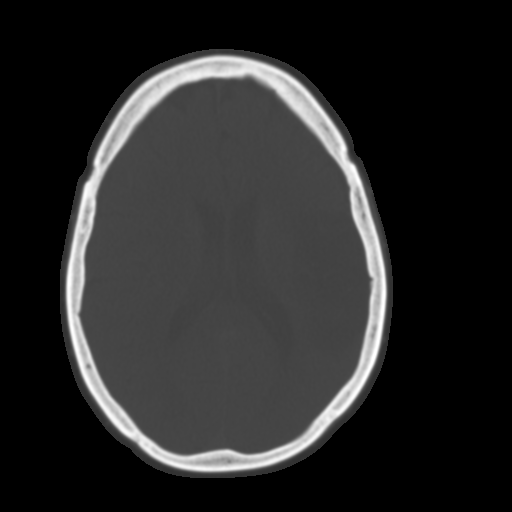
[im 19/33  brain]
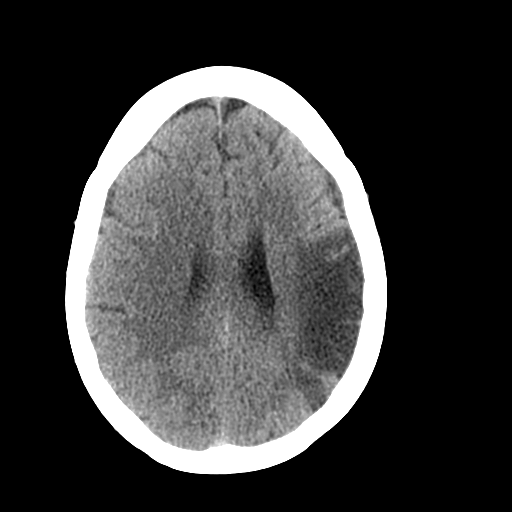
[im 23/33  brain]
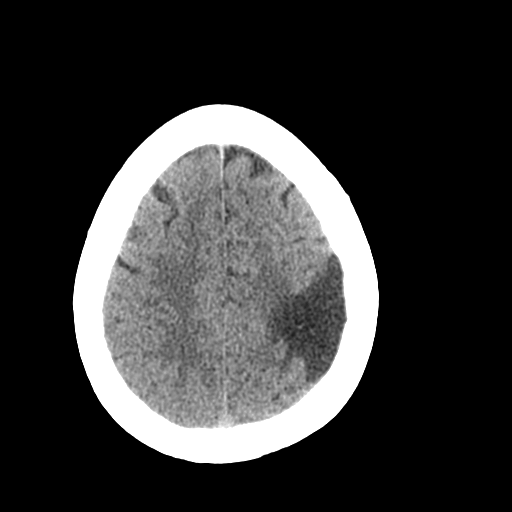
[im 26/33  brain]
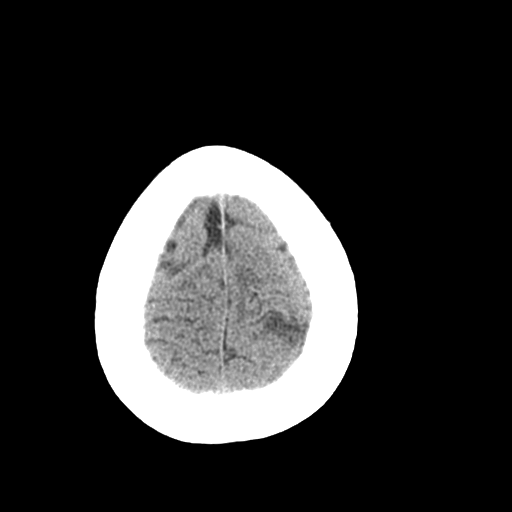
[im 30/33  brain]
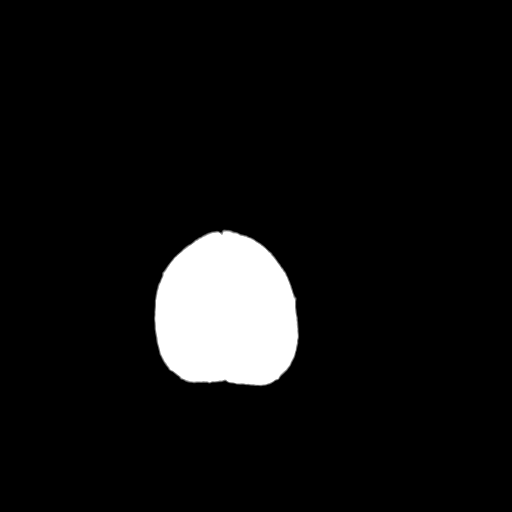
[im 30/33  bone]
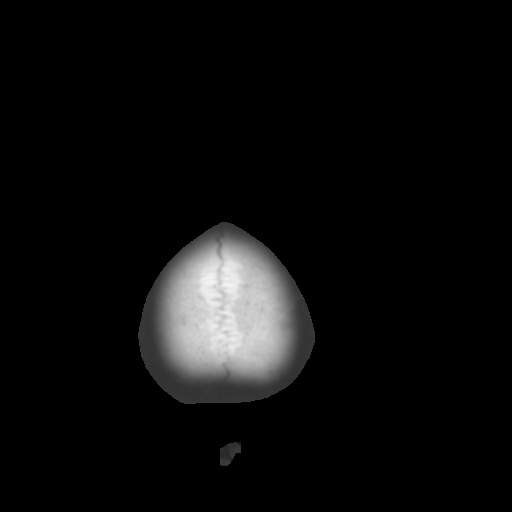

[Series 5: sagittal · sagittal · 0.32mm/px · 3 of 53 slices shown]
[im 18/53  brain]
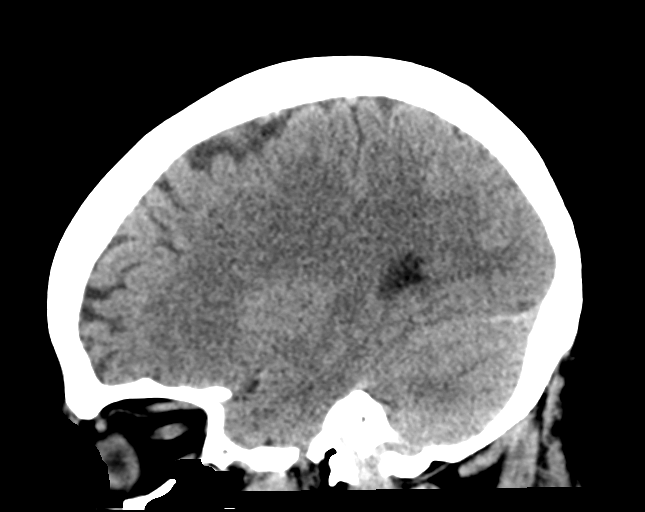
[im 27/53  brain]
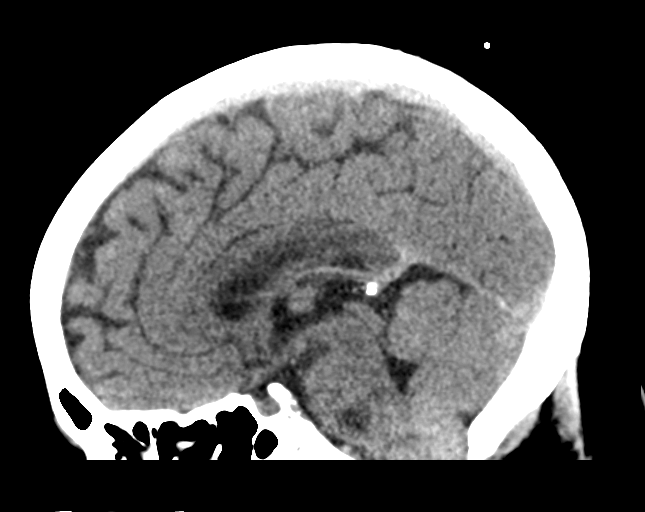
[im 35/53  brain]
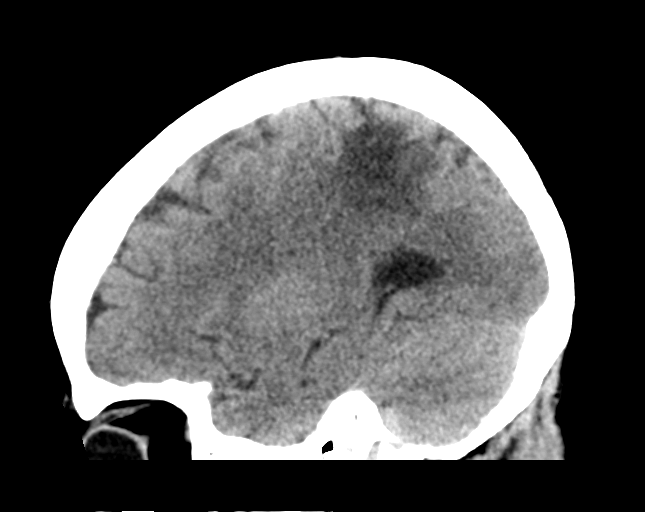

[Series 6: coronal · coronal · 0.32mm/px · 3 of 63 slices shown]
[im 21/63  brain]
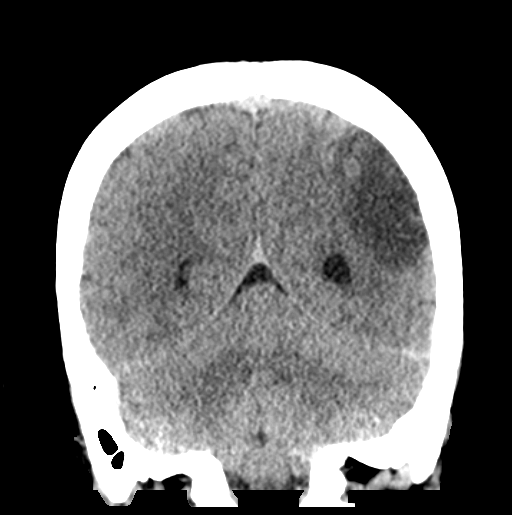
[im 28/63  brain]
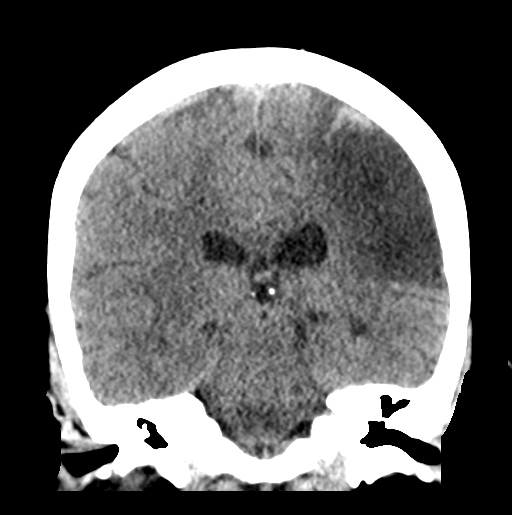
[im 35/63  brain]
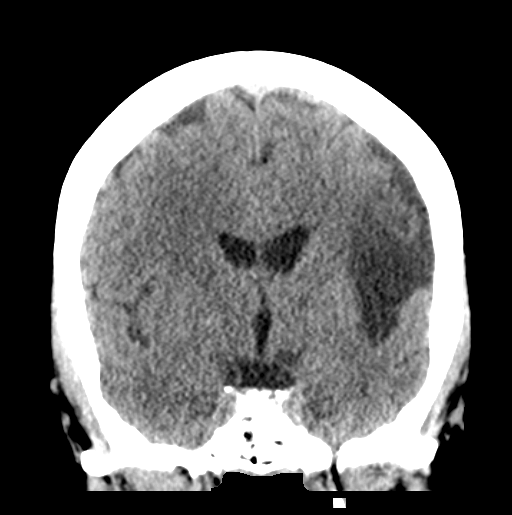

[15 of 47 positions shown; findings below may reference images not displayed]

FINDINGS: There is no evidence of acute infarction, mass lesion, or intra- or
extra-axial hemorrhage on CT.

There has been interval evolution of the previously noted infarct at
the left parietal lobe, extending minimally into the left temporal
lobe, with involvement of the left basal ganglia. Underlying
encephalomalacia is noted.

The brainstem and fourth ventricle are within normal limits. The
basal ganglia are unremarkable in appearance. The cerebral
hemispheres demonstrate grossly normal gray-white differentiation.
No mass effect or midline shift is seen.

There is no evidence of fracture; visualized osseous structures are
unremarkable in appearance. The orbits are within normal limits. The
paranasal sinuses and mastoid air cells are well-aerated. No
significant soft tissue abnormalities are seen.
IMPRESSION: 1. No acute intracranial pathology seen on CT.
2. Interval evolution of previously noted infarct at the left
parietal lobe, extending minimally into the left temporal lobe, with
involvement of the left basal ganglia. Underlying chronic
encephalomalacia noted.

## 2017-11-10 ENCOUNTER — Emergency Department (HOSPITAL_COMMUNITY): Payer: Medicare HMO

## 2017-11-10 ENCOUNTER — Encounter (HOSPITAL_COMMUNITY): Payer: Self-pay | Admitting: Family Medicine

## 2017-11-10 ENCOUNTER — Other Ambulatory Visit: Payer: Self-pay

## 2017-11-10 ENCOUNTER — Emergency Department (HOSPITAL_COMMUNITY)
Admission: EM | Admit: 2017-11-10 | Discharge: 2017-11-11 | Disposition: A | Discharge status: Home Health | Payer: Medicare HMO | Attending: Emergency Medicine | Admitting: Emergency Medicine

## 2017-11-10 DIAGNOSIS — J40 Bronchitis, not specified as acute or chronic: Secondary | ICD-10-CM | POA: Diagnosis not present

## 2017-11-10 DIAGNOSIS — Z79899 Other long term (current) drug therapy: Secondary | ICD-10-CM | POA: Insufficient documentation

## 2017-11-10 DIAGNOSIS — Z85038 Personal history of other malignant neoplasm of large intestine: Secondary | ICD-10-CM | POA: Diagnosis not present

## 2017-11-10 DIAGNOSIS — J449 Chronic obstructive pulmonary disease, unspecified: Secondary | ICD-10-CM | POA: Diagnosis not present

## 2017-11-10 DIAGNOSIS — I10 Essential (primary) hypertension: Secondary | ICD-10-CM | POA: Insufficient documentation

## 2017-11-10 DIAGNOSIS — F17210 Nicotine dependence, cigarettes, uncomplicated: Secondary | ICD-10-CM | POA: Insufficient documentation

## 2017-11-10 DIAGNOSIS — R0602 Shortness of breath: Secondary | ICD-10-CM | POA: Diagnosis present

## 2017-11-10 DIAGNOSIS — Z7901 Long term (current) use of anticoagulants: Secondary | ICD-10-CM | POA: Diagnosis not present

## 2017-11-10 LAB — CBC WITH DIFFERENTIAL/PLATELET
BASOS PCT: 0 %
Basophils Absolute: 0 10*3/uL (ref 0.0–0.1)
Eosinophils Absolute: 0.2 10*3/uL (ref 0.0–0.7)
Eosinophils Relative: 3 %
HEMATOCRIT: 39.8 % (ref 36.0–46.0)
Hemoglobin: 13.2 g/dL (ref 12.0–15.0)
LYMPHS PCT: 38 %
Lymphs Abs: 2.2 10*3/uL (ref 0.7–4.0)
MCH: 33.2 pg (ref 26.0–34.0)
MCHC: 33.2 g/dL (ref 30.0–36.0)
MCV: 100.3 fL — ABNORMAL HIGH (ref 78.0–100.0)
MONO ABS: 0.5 10*3/uL (ref 0.1–1.0)
MONOS PCT: 8 %
NEUTROS ABS: 2.9 10*3/uL (ref 1.7–7.7)
Neutrophils Relative %: 51 %
Platelets: 151 10*3/uL (ref 150–400)
RBC: 3.97 MIL/uL (ref 3.87–5.11)
RDW: 14 % (ref 11.5–15.5)
WBC: 5.8 10*3/uL (ref 4.0–10.5)

## 2017-11-10 LAB — BASIC METABOLIC PANEL
Anion gap: 7 (ref 5–15)
BUN: 12 mg/dL (ref 6–20)
CALCIUM: 8.6 mg/dL — AB (ref 8.9–10.3)
CO2: 27 mmol/L (ref 22–32)
Chloride: 108 mmol/L (ref 101–111)
Creatinine, Ser: 0.74 mg/dL (ref 0.44–1.00)
GFR calc Af Amer: >60 mL/min (ref >60)
GFR calc non Af Amer: >60 mL/min (ref >60)
GLUCOSE: 111 mg/dL — AB (ref 65–99)
Potassium: 3.1 mmol/L — ABNORMAL LOW (ref 3.5–5.1)
Sodium: 142 mmol/L (ref 135–145)

## 2017-11-10 LAB — GROUP A STREP BY PCR: Group A Strep by PCR: NOT DETECTED

## 2017-11-10 MED ORDER — BENZONATATE 100 MG PO CAPS
200.0000 mg | ORAL_CAPSULE | Freq: Once | ORAL | Status: AC
  Administered 2017-11-10: 200 mg via ORAL
  Filled 2017-11-10: qty 2

## 2017-11-10 MED ORDER — PREDNISONE 20 MG PO TABS: 40.0000 mg | ORAL_TABLET | Freq: Every day | ORAL | 0 refills | Status: DC

## 2017-11-10 MED ORDER — IPRATROPIUM-ALBUTEROL 0.5-2.5 (3) MG/3ML IN SOLN
3.0000 mL | Freq: Once | RESPIRATORY_TRACT | Status: AC
  Administered 2017-11-10: 3 mL via RESPIRATORY_TRACT
  Filled 2017-11-10: qty 3

## 2017-11-10 MED ORDER — PROMETHAZINE-DM 6.25-15 MG/5ML PO SYRP: 5.0000 mL | ORAL_SOLUTION | Freq: Four times a day (QID) | ORAL | 0 refills | Status: DC | PRN

## 2017-11-10 MED ORDER — PREDNISONE 20 MG PO TABS
60.0000 mg | ORAL_TABLET | Freq: Once | ORAL | Status: AC
  Administered 2017-11-10: 60 mg via ORAL
  Filled 2017-11-10: qty 3

## 2017-11-10 MED ORDER — POTASSIUM CHLORIDE CRYS ER 20 MEQ PO TBCR
40.0000 meq | EXTENDED_RELEASE_TABLET | Freq: Once | ORAL | Status: AC
  Administered 2017-11-10: 40 meq via ORAL
  Filled 2017-11-10: qty 2

## 2017-11-10 MED ORDER — OXYCODONE-ACETAMINOPHEN 5-325 MG PO TABS
2.0000 | ORAL_TABLET | Freq: Once | ORAL | Status: AC
  Administered 2017-11-10: 2 via ORAL
  Filled 2017-11-10: qty 2

## 2017-11-10 MED ORDER — ALBUTEROL SULFATE (2.5 MG/3ML) 0.083% IN NEBU: 2.5000 mg | INHALATION_SOLUTION | Freq: Four times a day (QID) | RESPIRATORY_TRACT | 12 refills | Status: AC | PRN

## 2017-11-10 NOTE — ED Triage Notes (Signed)
Patient is complaining of a non productive cough and shortness of breath that started today. Patient reports she has took Tylenol Cold & Flu prior to noon with no relief. Associated symptoms are chills.

## 2017-11-10 NOTE — ED Provider Notes (Signed)
[facility] DEPT Provider Note   CSN: [id number] Arrival date & time: 11/10/17  1841     History   Chief Complaint Chief Complaint  Patient presents with  . Cough  . Shortness of Breath    HPI Brooke [name] is a 62 y.o. female.  HPI Brooke [name] is a 62 y.o. female with history of asthma, pulmonary embolism, on Xarelto, hypertension, COPD, depression, CVA, presents to emergency department complaining of sore throat and cough.  Symptoms began yesterday.  Daughter accompanying patient to provides most of the history.  Daughter states she was sick last week, and believes that her mother got it from her.  No fever or chills.  No nausea or vomiting.  Patient reports chronic back pain that is worse from all the coughing, also reports chest pain that is only there when she is coughing.  She has not tried any medications for this prior to coming in.  She has been using her inhaler without AeroChamber with no relief.  Past Medical History:  Diagnosis Date  . Anxiety   . Aortic stenosis    a. mild by echo 05/2015.  . Arthritis   . Asthma   . Bilateral pulmonary embolism ([place]) 02/2014   a. PCCM recommended lifelong anticoagulation.  . Chronic back pain   . Chronic bronchitis   . Chronic pain   . Colon cancer ([place])    a. s/p surgery, chemotherapy.  Marland Kitchen COPD (chronic obstructive pulmonary disease) ([place])   . Depression   . DVT (deep venous thrombosis) ([place]) 02/2014  . Dyslipidemia   . GERD (gastroesophageal reflux disease)   . Hypertension   . Migraine headache   . Morbid obesity ([place])   . Obstructive sleep apnea    mild  . Osteoarthritis   . Ovarian cyst   . PVC's (premature ventricular contractions) 2013  . Stroke ([place])   . Tobacco abuse   . Uterine fibroid     Patient Active Problem List   Diagnosis Date Noted  . COPD exacerbation ([place]) 08/10/2016  . Acute respiratory failure ([place]) 08/08/2016  . Acute bronchitis 08/08/2016  . Neuropathy  10/29/2015  . Seizures ([place]) 06/20/2015  . Expressive aphasia   . 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 Stroke with cerebral ischemia ([place])   . Chronic obstructive pulmonary disease ([place])   . Hx of migraines   . OSA (obstructive sleep apnea)   . Hemiparesis, aphasia, and dysphagia as late effect of cerebrovascular accident (CVA) ([place])   . Elevated troponin 05/28/2015  . Essential hypertension 05/28/2015  . Hyperkalemia 05/28/2015  . History of pulmonary embolism 05/28/2015  . History of DVT (deep vein thrombosis) 05/28/2015  . Global aphasia   . Aphasia   . Weakness   . Stroke ([place]) 05/26/2015  . Acute tonsillitis 06/25/2014  . Respiratory failure ([place]) 02/23/2014  . Asthma, chronic 02/08/2014  . Obstructive sleep apnea 02/08/2014  . GERD (gastroesophageal reflux disease) 09/28/2013  . Chest pain 04/19/2013  . Obesity, Class III, BMI 40-49.9 (morbid obesity) ([place]) 08/23/2012  . Constipation 08/20/2012  . Headache(784.0) 08/20/2012  . Chronic pain syndrome 08/19/2012  . Hyperglycemia 08/19/2012  . Insomnia 08/19/2012  . Hypokalemia 10/21/2011  . Tobacco abuse 10/21/2011  . Arthritis 10/21/2011  . Anxiety 10/21/2011  . Depression 10/21/2011  . Esophageal reflux 07/30/2011  . Diarrhea following gastrointestinal surgery 07/30/2011  . PVC (premature ventricular contraction) 07/10/2011  . Morbid obesity ([place])   .  Hypertension   . Dyslipidemia   . 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 CARPAL TUNNEL RELEASE     rt  . CESAREAN SECTION    . COLONOSCOPY    . KNEE ARTHROSCOPY     bil.  Marland Kitchen MOUTH SURGERY     teeth extraction  . SUBTOTAL COLECTOMY    . TUBAL LIGATION       OB History    Gravida  2   Para  2   Term  2   Preterm      AB      Living  2     SAB      TAB      Ectopic      Multiple      Live  Births               Home Medications    Prior to Admission medications   Medication Sig Start Date End Date Taking? Authorizing Provider  acetaminophen (TYLENOL) 500 MG tablet Take 1,000 mg by mouth every 8 (eight) hours as needed for moderate pain.  10/20/17  Yes [provider]  albuterol (PROVENTIL HFA;VENTOLIN HFA) 108 (90 Base) MCG/ACT inhaler Inhale 2 puffs into the lungs every 6 (six) hours as needed for wheezing or shortness of breath (shortness of breath). 10/29/15  Yes Charlott Rakes, MD  amitriptyline (ELAVIL) 50 MG tablet Take 50 mg by mouth at bedtime.   Yes [provider]  atorvastatin (LIPITOR) 40 MG tablet TAKE 1 TABLET DAILY AT 6 PM 01/22/16  Yes Newlin, Enobong, MD  gabapentin (NEURONTIN) 600 MG tablet Take 600 mg by mouth 2 (two) times daily.  09/29/17  Yes [provider]  hydrochlorothiazide (HYDRODIURIL) 12.5 MG tablet Take 12.5 mg by mouth daily. 02/24/17  Yes [provider]  levETIRAcetam (KEPPRA) 500 MG tablet TAKE 1 TABLET TWICE DAILY 01/22/16  Yes Newlin, Enobong, MD  omeprazole (PRILOSEC) 40 MG capsule TAKE 1 CAPSULE EVERY DAY 09/17/15  Yes Newlin, Enobong, MD  oxyCODONE-acetaminophen (PERCOCET) 7.5-325 MG tablet Take 1 tablet by mouth every 8 (eight) hours as needed for severe pain.   Yes [provider]  rivaroxaban (XARELTO) 20 MG TABS tablet TAKE ONE TABLET BY MOUTH ONCE DAILY IN THE EVENING WITH SUPPER 10/29/15  Yes Newlin, Enobong, MD  gabapentin (NEURONTIN) 300 MG capsule TAKE 2 CAPSULES TWICE DAILY Patient not taking: Reported on 11/10/2017 02/26/16   Charlott Rakes, MD  fluticasone (FLOVENT HFA) 110 MCG/ACT inhaler Inhale 2 puffs into the lungs every 12 (twelve) hours. Patient not taking: Reported on 09/23/2014 01/17/14 10/13/14  Lorayne Marek, MD  ipratropium-albuterol (DUONEB) 0.5-2.5 (3) MG/3ML SOLN Take 3 mLs by nebulization every 4 (four) hours. 08/09/15 09/16/15  Comer Locket, PA-C    Family History Family  History  Problem Relation Age of Onset  . Uterine cancer Mother   . Ovarian cancer Mother   . Stroke Father   . Diabetes Father   . Hypertension Father   . Colon cancer Maternal Uncle   . Breast cancer Maternal Grandmother   . Diabetes Sister   . Asthma Child   . Asthma Child     Social History Social History   Tobacco Use  . Smoking status: Current Every Day Smoker    Packs/day: 0.50    Years: 37.00    Pack years: 18.50    Types: Cigarettes  . Smokeless  tobacco: Never Used  . Tobacco comment: trying to quit, down to .5ppd X2 years ago.   Substance Use Topics  . Alcohol use: No  . Drug use: No     Allergies   Kiwi extract and Aspirin   Review of Systems Review of Systems  Constitutional: Negative for chills and fever.  Respiratory: Positive for cough, shortness of breath and wheezing. Negative for chest tightness.   Cardiovascular: Positive for chest pain. Negative for palpitations and leg swelling.  Gastrointestinal: Negative for abdominal pain, diarrhea, nausea and vomiting.  Genitourinary: Negative for dysuria, flank pain, pelvic pain, vaginal bleeding, vaginal discharge and vaginal pain.  Musculoskeletal: Positive for arthralgias and back pain. Negative for myalgias, neck pain and neck stiffness.  Skin: Negative for rash.  Neurological: Negative for dizziness, weakness and headaches.  All other systems reviewed and are negative.    Physical Exam Updated Vital Signs BP 133/67 (BP Location: Left Arm)   Pulse 86   Temp 98.4 F (36.9 C) (Oral)   Resp 14   Ht 4\' 10"  (1.473 m)   Wt 80.3 kg (177 lb)   LMP 05/24/2003   SpO2 100%   BMI 36.99 kg/m   Physical Exam  Constitutional: She appears well-developed and well-nourished. No distress.  HENT:  Head: Normocephalic.  Nasal congestion present  Eyes: Conjunctivae are normal.  Neck: Neck supple.  Cardiovascular: Normal rate, regular rhythm and normal heart sounds.  Pulmonary/Chest: Effort normal. No  respiratory distress. She has wheezes. She has no rales. She exhibits no tenderness.  Mild end expiratory wheezes bilaterally  Abdominal: Soft. Bowel sounds are normal. She exhibits no distension. There is no tenderness. There is no rebound.  Musculoskeletal: She exhibits no edema.  Neurological: She is alert.  Skin: Skin is warm and dry.  Psychiatric: She has a normal mood and affect. Her behavior is normal.  Nursing note and vitals reviewed.    ED Treatments / Results  Labs (all labs ordered are listed, but only abnormal results are displayed) Labs Reviewed  CBC WITH DIFFERENTIAL/PLATELET - Abnormal; Notable for the following components:      Result Value   MCV 100.3 (*)    All other components within normal limits  BASIC METABOLIC PANEL - Abnormal; Notable for the following components:   Potassium 3.1 (*)    Glucose, Bld 111 (*)    Calcium 8.6 (*)    All other components within normal limits  GROUP A STREP BY PCR    EKG EKG Interpretation  Date/Time:  Tuesday November 10 2017 19:44:27 EDT Ventricular Rate:  96 PR Interval:    QRS Duration: 89 QT Interval:  363 QTC Calculation: 459 R Axis:   33 Text Interpretation:  Sinus tachycardia Ventricular trigeminy Baseline wander in lead(s) V2 V3 V4 V5 V6 Confirmed by Milton Ferguson [phone]) on 11/10/2017 10:25:26 PM   Radiology Dg Chest 2 View  Result Date: 11/10/2017 CLINICAL DATA:  Dyspnea EXAM: CHEST - 2 VIEW COMPARISON:  09/09/2016 chest radiograph. FINDINGS: Stable cardiomediastinal silhouette with normal heart size. No pneumothorax. No pleural effusion. Lungs appear clear, with no acute consolidative airspace disease and no pulmonary edema. Surgical clips overlie the left upper abdomen. IMPRESSION: No active cardiopulmonary disease. Electronically Signed   By: Ilona Sorrel M.D.   On: 11/10/2017 20:21    Procedures Procedures (including critical care time)  Medications Ordered in ED Medications  predniSONE (DELTASONE) tablet  60 mg (has no administration in time range)  ipratropium-albuterol (DUONEB) 0.5-2.5 (3) MG/3ML nebulizer  solution 3 mL (has no administration in time range)  oxyCODONE-acetaminophen (PERCOCET/ROXICET) 5-325 MG per tablet 2 tablet (has no administration in time range)     Initial Impression / Assessment and Plan / ED Course  I have reviewed the triage vital signs and the nursing notes.  Pertinent labs & imaging results that were available during my care of the patient were reviewed by me and considered in my medical decision making (see chart for details).     Patient in emergency department with cough, congestion, sore throat onset yesterday.  Patient has multiple comorbidities, including CVA, prior PEs on Xarelto, hypertension, COPD.  She does have mild wheezing on exam.  Will try to give her a DuoNeb and start on prednisone.  Patient is continuously coughing in the room.  Her chest x-ray is negative.  Will get basic labs, rapid strep, patient is requesting pain medication for her chronic back pain, she has missed a dose this evening since she is been in the waiting room, I will give her Percocet.  11:03 PM Patient is resting comfortably.  Her cough appears to be improved, she is no longer coughing while I am talking to her in the room.  Lungs are clear.  Normal CBC, basic metabolic panel shows potassium 3.1, otherwise unremarkable.  40 mEq given in emergency department.  Vital signs continued to be normal.  Most likely viral infection, symptoms just began yesterday.  daughter with similar symptoms last week.  Given some wheezing and shortness of breath, will start on prednisone, will give refill on her duo nebs at home, will give her cough medication.  Follow-up with PCP in 2 to 3 days  Vitals:   11/10/17 1849 11/10/17 1936 11/10/17 2204 11/10/17 2241  BP: 133/67  130/68   Pulse: 86  92   Resp: 14  18   Temp: 98.4 F (36.9 C)  (!) 97.5 F (36.4 C)   TempSrc: Oral  Oral   SpO2: 100%  96%  97%  Weight:  80.3 kg (177 lb)    Height:  4\' 10"  (1.473 m)       Final Clinical Impressions(s) / ED Diagnoses   Final diagnoses:  Bronchitis    ED Discharge Orders        Ordered    predniSONE (DELTASONE) 20 MG tablet  Daily     11/10/17 2314    promethazine-dextromethorphan (PROMETHAZINE-DM) 6.25-15 MG/5ML syrup  4 times daily PRN     11/10/17 2314    albuterol (PROVENTIL) (2.5 MG/3ML) 0.083% nebulizer solution  Every 6 hours PRN     11/10/17 2314       Jeannett Senior, PA-C 11/10/17 2316    Milton Ferguson, MD 11/13/17 1315

## 2017-11-10 NOTE — Discharge Instructions (Addendum)
Nebulized treatment or inhaler every 3-4 hours.  Take prednisone as prescribed until all gone.  Take cough medication as prescribed as needed.  Salt water gargles for sore throat.  Rest.  Drink plenty of fluids.  Follow-up with family doctor in 2 to 3 days for recheck.  Return if worsening symptoms

## 2017-11-24 ENCOUNTER — Other Ambulatory Visit: Payer: Self-pay | Admitting: Internal Medicine

## 2017-11-24 DIAGNOSIS — Z1231 Encounter for screening mammogram for malignant neoplasm of breast: Secondary | ICD-10-CM

## 2017-12-18 ENCOUNTER — Ambulatory Visit: Payer: Medicare HMO

## 2018-01-04 ENCOUNTER — Emergency Department (HOSPITAL_COMMUNITY)
Admission: EM | Admit: 2018-01-04 | Discharge: 2018-01-04 | Disposition: A | Discharge status: Home / Self Care | Payer: Medicare HMO | Attending: Emergency Medicine | Admitting: Emergency Medicine

## 2018-01-04 ENCOUNTER — Emergency Department (HOSPITAL_BASED_OUTPATIENT_CLINIC_OR_DEPARTMENT_OTHER): Payer: Medicare HMO

## 2018-01-04 ENCOUNTER — Emergency Department (HOSPITAL_COMMUNITY): Payer: Medicare HMO

## 2018-01-04 ENCOUNTER — Encounter (HOSPITAL_COMMUNITY): Payer: Self-pay

## 2018-01-04 ENCOUNTER — Other Ambulatory Visit: Payer: Self-pay

## 2018-01-04 DIAGNOSIS — R600 Localized edema: Secondary | ICD-10-CM

## 2018-01-04 DIAGNOSIS — J449 Chronic obstructive pulmonary disease, unspecified: Secondary | ICD-10-CM | POA: Diagnosis not present

## 2018-01-04 DIAGNOSIS — F17210 Nicotine dependence, cigarettes, uncomplicated: Secondary | ICD-10-CM | POA: Insufficient documentation

## 2018-01-04 DIAGNOSIS — Z7901 Long term (current) use of anticoagulants: Secondary | ICD-10-CM | POA: Diagnosis not present

## 2018-01-04 DIAGNOSIS — R609 Edema, unspecified: Secondary | ICD-10-CM | POA: Diagnosis not present

## 2018-01-04 DIAGNOSIS — Z79899 Other long term (current) drug therapy: Secondary | ICD-10-CM | POA: Diagnosis not present

## 2018-01-04 DIAGNOSIS — R220 Localized swelling, mass and lump, head: Secondary | ICD-10-CM | POA: Insufficient documentation

## 2018-01-04 DIAGNOSIS — I10 Essential (primary) hypertension: Secondary | ICD-10-CM | POA: Insufficient documentation

## 2018-01-04 LAB — COMPREHENSIVE METABOLIC PANEL
ALBUMIN: 3.7 g/dL (ref 3.5–5.0)
ALT: 11 U/L (ref 0–44)
AST: 20 U/L (ref 15–41)
Alkaline Phosphatase: 66 U/L (ref 38–126)
Anion gap: 9 (ref 5–15)
BILIRUBIN TOTAL: 0.6 mg/dL (ref 0.3–1.2)
BUN: 13 mg/dL (ref 6–20)
CO2: 29 mmol/L (ref 22–32)
CREATININE: 0.7 mg/dL (ref 0.44–1.00)
Calcium: 8.8 mg/dL — ABNORMAL LOW (ref 8.9–10.3)
Chloride: 100 mmol/L (ref 98–111)
GFR calc Af Amer: >60 mL/min (ref >60)
GLUCOSE: 88 mg/dL (ref 70–99)
POTASSIUM: 3.6 mmol/L (ref 3.5–5.1)
Sodium: 138 mmol/L (ref 135–145)
TOTAL PROTEIN: 7.2 g/dL (ref 6.5–8.1)

## 2018-01-04 LAB — URINALYSIS, ROUTINE W REFLEX MICROSCOPIC
Bilirubin Urine: NEGATIVE
GLUCOSE, UA: NEGATIVE mg/dL
Ketones, ur: NEGATIVE mg/dL
Leukocytes, UA: NEGATIVE
NITRITE: NEGATIVE
Protein, ur: NEGATIVE mg/dL
Specific Gravity, Urine: 1.01 (ref 1.005–1.030)
pH: 6 (ref 5.0–8.0)

## 2018-01-04 LAB — CBC WITH DIFFERENTIAL/PLATELET
Basophils Absolute: 0 10*3/uL (ref 0.0–0.1)
Basophils Relative: 0 %
EOS ABS: 0.1 10*3/uL (ref 0.0–0.7)
EOS PCT: 2 %
HCT: 40.4 % (ref 36.0–46.0)
Hemoglobin: 13.3 g/dL (ref 12.0–15.0)
Lymphocytes Relative: 36 %
Lymphs Abs: 2.2 10*3/uL (ref 0.7–4.0)
MCH: 33.4 pg (ref 26.0–34.0)
MCHC: 32.9 g/dL (ref 30.0–36.0)
MCV: 101.5 fL — ABNORMAL HIGH (ref 78.0–100.0)
Monocytes Absolute: 0.5 10*3/uL (ref 0.1–1.0)
Monocytes Relative: 9 %
Neutro Abs: 3.2 10*3/uL (ref 1.7–7.7)
Neutrophils Relative %: 53 %
Platelets: 128 10*3/uL — ABNORMAL LOW (ref 150–400)
RBC: 3.98 MIL/uL (ref 3.87–5.11)
RDW: 14.6 % (ref 11.5–15.5)
WBC: 6 10*3/uL (ref 4.0–10.5)

## 2018-01-04 LAB — LIPASE, BLOOD: Lipase: 21 U/L (ref 11–51)

## 2018-01-04 LAB — BRAIN NATRIURETIC PEPTIDE: B NATRIURETIC PEPTIDE 5: 68.9 pg/mL (ref 0.0–100.0)

## 2018-01-04 LAB — TROPONIN I: Troponin I: <0.03 ng/mL (ref <0.03)

## 2018-01-04 MED ORDER — MORPHINE SULFATE (PF) 4 MG/ML IV SOLN
4.0000 mg | Freq: Once | INTRAVENOUS | Status: AC
  Administered 2018-01-04: 4 mg via INTRAMUSCULAR
  Filled 2018-01-04: qty 1

## 2018-01-04 MED ORDER — PENICILLIN V POTASSIUM 500 MG PO TABS: 500.0000 mg | ORAL_TABLET | Freq: Three times a day (TID) | ORAL | 0 refills | Status: AC

## 2018-01-04 MED ORDER — HYDROCODONE-ACETAMINOPHEN 5-325 MG PO TABS
1.0000 | ORAL_TABLET | Freq: Once | ORAL | Status: AC
Start: 2018-01-04 — End: 2018-01-04
  Administered 2018-01-04: 1 via ORAL
  Filled 2018-01-04: qty 1

## 2018-01-04 MED ORDER — PENICILLIN V POTASSIUM 500 MG PO TABS
500.0000 mg | ORAL_TABLET | Freq: Once | ORAL | Status: AC
  Administered 2018-01-04: 500 mg via ORAL
  Filled 2018-01-04: qty 1

## 2018-01-04 MED ORDER — HYDROCODONE-ACETAMINOPHEN 5-325 MG PO TABS: 1.0000 | ORAL_TABLET | Freq: Four times a day (QID) | ORAL | 0 refills | Status: AC | PRN

## 2018-01-04 MED ORDER — MORPHINE SULFATE (PF) 4 MG/ML IV SOLN: 4.0000 mg | Freq: Once | INTRAVENOUS | Status: DC

## 2018-01-04 NOTE — Progress Notes (Signed)
Bilateral lower extremity venous duplex has been completed. Negative for obvious evidence of DVT. Results were given to Dr. Vanita Panda.  01/04/18 1:17 PM Brooke [name] RVT

## 2018-01-04 NOTE — ED Triage Notes (Signed)
Pt arrives POV from home. Pt has hx of CVA and reports rt sided facial swelling and bilateral lower extremity swelling. Pt reports that the swelling. Pt seen at PCP for extremity swelling but pt reports that the swelling has increased since visit

## 2018-01-04 NOTE — ED Provider Notes (Signed)
[facility] DEPT Provider Note   CSN: [id number] Arrival date & time: 01/04/18  0847     History   Chief Complaint Chief Complaint  Patient presents with  . Facial Swelling  . Leg Swelling    HPI Brooke [name] is a 62 y.o. female.  HPI Patient with multiple medical issues including prior stroke, chronic anticoagulation, anxiety presents with concern for swelling in both lower extremities and her face. Onset was about 2 days ago. Since onset symptoms have been progressive, with diffuse swelling in both lower extremities. She is here with a companion who assists with the HPI. She notes compliance with all medication including anticoagulant, no changes in medication, or household exposure, diet exposure or other new potential allergic items. No relief with anything, in spite of taking all of her regular medication. No new chest pain, abdominal pain, nausea. She has waxing, waning, numbness which is unchanged. No new speech difficulty.  Past Medical History:  Diagnosis Date  . Anxiety   . Aortic stenosis    a. mild by echo 05/2015.  . Arthritis   . Asthma   . Bilateral pulmonary embolism ([place]) 02/2014   a. PCCM recommended lifelong anticoagulation.  . Chronic back pain   . Chronic bronchitis   . Chronic pain   . Colon cancer ([place])    a. s/p surgery, chemotherapy.  Marland Kitchen COPD (chronic obstructive pulmonary disease) ([place])   . Depression   . DVT (deep venous thrombosis) ([place]) 02/2014  . Dyslipidemia   . GERD (gastroesophageal reflux disease)   . Hypertension   . Migraine headache   . Morbid obesity ([place])   . Obstructive sleep apnea    mild  . Osteoarthritis   . Ovarian cyst   . PVC's (premature ventricular contractions) 2013  . Stroke ([place])   . Tobacco abuse   . Uterine fibroid     Patient Active Problem List   Diagnosis Date Noted  . COPD exacerbation ([place]) 08/10/2016  . Acute respiratory failure ([place]) 08/08/2016  . Acute  bronchitis 08/08/2016  . Neuropathy 10/29/2015  . Seizures ([place]) 06/20/2015  . Expressive aphasia   . 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 ([place]) 05/30/2015  . Stroke with cerebral ischemia ([place])   . Chronic obstructive pulmonary disease ([place])   . Hx of migraines   . OSA (obstructive sleep apnea)   . Hemiparesis, aphasia, and dysphagia as late effect of cerebrovascular accident (CVA) ([place])   . Elevated troponin 05/28/2015  . Essential hypertension 05/28/2015  . Hyperkalemia 05/28/2015  . History of pulmonary embolism 05/28/2015  . History of DVT (deep vein thrombosis) 05/28/2015  . Global aphasia   . Aphasia   . Weakness   . Stroke ([place]) 05/26/2015  . Acute tonsillitis 06/25/2014  . Respiratory failure ([place]) 02/23/2014  . Asthma, chronic 02/08/2014  . Obstructive sleep apnea 02/08/2014  . GERD (gastroesophageal reflux disease) 09/28/2013  . Chest pain 04/19/2013  . Obesity, Class III, BMI 40-49.9 (morbid obesity) ([place]) 08/23/2012  . Constipation 08/20/2012  . Headache(784.0) 08/20/2012  . Chronic pain syndrome 08/19/2012  . Hyperglycemia 08/19/2012  . Insomnia 08/19/2012  . Hypokalemia 10/21/2011  . Tobacco abuse 10/21/2011  . Arthritis 10/21/2011  . Anxiety 10/21/2011  . Depression 10/21/2011  . Esophageal reflux 07/30/2011  . Diarrhea following gastrointestinal surgery 07/30/2011  . PVC (premature ventricular contraction) 07/10/2011  . Morbid obesity ([place])   . Hypertension   . Dyslipidemia   . Personal history of malignant  neoplasm of unspecified site in gastrointestinal tract 06/25/2011    Past Surgical History:  Procedure Laterality Date  . CARPAL TUNNEL RELEASE     rt  . CESAREAN SECTION    . COLONOSCOPY    . KNEE ARTHROSCOPY     bil.  Marland Kitchen MOUTH SURGERY     teeth extraction  . SUBTOTAL COLECTOMY    . TUBAL LIGATION       OB History    Gravida  2   Para  2   Term  2   Preterm      AB      Living  2     SAB      TAB       Ectopic      Multiple      Live Births               Home Medications    Prior to Admission medications   Medication Sig Start Date End Date Taking? Authorizing Provider  acetaminophen (TYLENOL) 500 MG tablet Take 1,000 mg by mouth every 8 (eight) hours as needed for moderate pain.  10/20/17  Yes [provider]  albuterol (PROVENTIL HFA;VENTOLIN HFA) 108 (90 Base) MCG/ACT inhaler Inhale 2 puffs into the lungs every 6 (six) hours as needed for wheezing or shortness of breath (shortness of breath). 10/29/15  Yes Charlott Rakes, MD  albuterol (PROVENTIL) (2.5 MG/3ML) 0.083% nebulizer solution Take 3 mLs (2.5 mg total) by nebulization every 6 (six) hours as needed for wheezing or shortness of breath. 11/10/17  Yes Kirichenko, Tatyana, PA-C  amitriptyline (ELAVIL) 50 MG tablet Take 50 mg by mouth at bedtime.   Yes [provider]  atorvastatin (LIPITOR) 40 MG tablet TAKE 1 TABLET DAILY AT 6 PM 01/22/16  Yes Newlin, Charlane Ferretti, MD  Cholecalciferol (VITAMIN D3) 5000 units CAPS Take 5,000 Units by mouth daily. 12/29/17  Yes [provider]  gabapentin (NEURONTIN) 600 MG tablet Take 600 mg by mouth 2 (two) times daily.  09/29/17  Yes [provider]  hydrochlorothiazide (HYDRODIURIL) 12.5 MG tablet Take 12.5 mg by mouth daily. 02/24/17  Yes [provider]  levETIRAcetam (KEPPRA) 500 MG tablet TAKE 1 TABLET TWICE DAILY 01/22/16  Yes Newlin, Enobong, MD  omeprazole (PRILOSEC) 40 MG capsule TAKE 1 CAPSULE EVERY DAY 09/17/15  Yes Newlin, Enobong, MD  oxyCODONE-acetaminophen (PERCOCET) 7.5-325 MG tablet Take 1 tablet by mouth every 8 (eight) hours as needed for severe pain.   Yes [provider]  rivaroxaban (XARELTO) 20 MG TABS tablet TAKE ONE TABLET BY MOUTH ONCE DAILY IN THE EVENING WITH SUPPER 10/29/15  Yes Charlott Rakes, MD    Family History Family History  Problem Relation Age of Onset  . Uterine cancer Mother   . Ovarian cancer Mother   .  Stroke Father   . Diabetes Father   . Hypertension Father   . Colon cancer Maternal Uncle   . Breast cancer Maternal Grandmother   . Diabetes Sister   . Asthma Child   . Asthma Child     Social History Social History   Tobacco Use  . Smoking status: Current Every Day Smoker    Packs/day: 0.50    Years: 37.00    Pack years: 18.50    Types: Cigarettes  . Smokeless tobacco: Never Used  . Tobacco comment: trying to quit, down to .5ppd X2 years ago.   Substance Use Topics  . Alcohol use: No  . Drug use: No  Allergies   Kiwi extract and Aspirin   Review of Systems Review of Systems  Constitutional:       Per HPI, otherwise negative  HENT:       Per HPI, otherwise negative  Respiratory:       Per HPI, otherwise negative  Cardiovascular:       Per HPI, otherwise negative  Gastrointestinal: Negative for vomiting.  Endocrine:       Negative aside from HPI  Genitourinary:       Neg aside from HPI   Musculoskeletal:       Per HPI, otherwise negative  Skin: Negative.   Neurological: Positive for speech difficulty, weakness and numbness. Negative for syncope.  Hematological:       Per HPI     Physical Exam Updated Vital Signs BP (!) 182/101 (BP Location: Left Arm)   Pulse (!) 102   Temp 98.5 F (36.9 C) (Oral)   Resp 18   Wt 79.4 kg (175 lb)   LMP 05/24/2003   SpO2 100%   BMI 36.58 kg/m   Physical Exam  Constitutional: She is oriented to person, place, and time. She appears well-developed and well-nourished. She has a sickly appearance. No distress.  unwell-appearing female awake and alert  HENT:  Head: Normocephalic and atraumatic.    Mouth/Throat:    Eyes: Conjunctivae and EOM are normal.  Cardiovascular: Normal rate and regular rhythm.  Pulmonary/Chest: Effort normal and breath sounds normal. No stridor. No respiratory distress.  Abdominal: She exhibits no distension.  Musculoskeletal: She exhibits edema.  Bilateral lower extremity edema, no  appreciable warmth, though skin appears slightly erythematous  Neurological: She is alert and oriented to person, place, and time. She displays atrophy. A cranial nerve deficit is present. She exhibits abnormal muscle tone.  Chronic right-sided deficits, patient notes to be unchanged, with atrophy, weakness in the right upper extremity, loss of right facial tone  Speech deficit appears chronic  Skin: Skin is warm and dry.  Psychiatric: She has a normal mood and affect.  Nursing note and vitals reviewed.    ED Treatments / Results  Labs (all labs ordered are listed, but only abnormal results are displayed) Labs Reviewed  COMPREHENSIVE METABOLIC PANEL - Abnormal; Notable for the following components:      Result Value   Calcium 8.8 (*)    All other components within normal limits  CBC WITH DIFFERENTIAL/PLATELET - Abnormal; Notable for the following components:   MCV 101.5 (*)    Platelets 128 (*)    All other components within normal limits  URINALYSIS, ROUTINE W REFLEX MICROSCOPIC - Abnormal; Notable for the following components:   Color, Urine STRAW (*)    Hgb urine dipstick SMALL (*)    Bacteria, UA RARE (*)    All other components within normal limits  LIPASE, BLOOD  BRAIN NATRIURETIC PEPTIDE  TROPONIN I    EKG EKG Interpretation  Date/Time:  Monday January 04 2018 08:59:15 EDT Ventricular Rate:  86 PR Interval:    QRS Duration: 81 QT Interval:  365 QTC Calculation: 437 R Axis:   26 Text Interpretation:  Sinus rhythm Premature ventricular complexes Baseline wander No significant change since last tracing Artifact Abnormal ekg Confirmed by Carmin Muskrat [phone]) on 01/04/2018 9:33:12 AM   Radiology Dg Chest 2 View  Result Date: 01/04/2018 CLINICAL DATA:  Facial swelling and lower extremity edema EXAM: CHEST - 2 VIEW COMPARISON:  11/10/2017 FINDINGS: Cardiac shadow is mildly prominent. The lungs are well aerated  bilaterally. No focal infiltrate or sizable effusion is  noted. Degenerative changes of the thoracic spine are seen. IMPRESSION: No active cardiopulmonary disease. Electronically Signed   By: Inez Catalina M.D.   On: 01/04/2018 12:28    Procedures Procedures (including critical care time)  Medications Ordered in ED Medications  penicillin v potassium (VEETID) tablet 500 mg (has no administration in time range)  HYDROcodone-acetaminophen (NORCO/VICODIN) 5-325 MG per tablet 1 tablet (has no administration in time range)  morphine 4 MG/ML injection 4 mg (4 mg Intramuscular Given by Other 01/04/18 1121)     Initial Impression / Assessment and Plan / ED Course  I have reviewed the triage vital signs and the nursing notes.  Pertinent labs & imaging results that were available during my care of the patient were reviewed by me and considered in my medical decision making (see chart for details).     3:05 PM Patient awake alert. She has not been ambulatory to the bathroom, in no distress, without apparent difficulty. I reviewed findings with patient, including reassuring labs, ultrasound with no evidence for DVT.  She does have mild hypertension, this may be secondary to pain. Given her reassuring exam, some suspicion for chronic worsening of her lower extremity edema, with no ultrasound evidence for DVT, no clinical findings consistent with cellulitis. Patient has no new dyspnea, is compliant with her anticoagulant, there is low suspicion for PE, low suspicion for atypical ACS. No evidence for bacteremia, sepsis. Patient does have poor dentition, likely contributing to her facial swelling. Patient amenable to initial course of antibiotics, analgesia, follow-up with primary care to ensure improvement.   Final Clinical Impressions(s) / ED Diagnoses  Facial swelling Lower extremity edema   Carmin Muskrat, MD 01/04/18 1507

## 2018-01-04 NOTE — Discharge Instructions (Signed)
As discussed, today's evaluation has been generally reassuring, and there is no evidence for DVT of the lower extremities, infection of lower extremities, or systemwide infection. There is some suspicion for dental infection contributing to your swelling and pain. Please take all medication as directed and be sure to have repeat evaluation in 48 hours to ensure improvement in your condition.  Return here for concerning changes in your condition.

## 2018-01-04 NOTE — ED Notes (Signed)
Vascular at bedside

## 2018-01-07 ENCOUNTER — Ambulatory Visit: Payer: Medicare HMO

## 2018-01-21 ENCOUNTER — Ambulatory Visit
Admission: RE | Admit: 2018-01-21 | Discharge: 2018-01-21 | Disposition: A | Discharge status: Home / Self Care | Payer: Medicare HMO | Source: Ambulatory Visit | Attending: Internal Medicine | Admitting: Internal Medicine

## 2018-01-21 DIAGNOSIS — Z1231 Encounter for screening mammogram for malignant neoplasm of breast: Secondary | ICD-10-CM

## 2018-04-18 IMAGING — CR DG CHEST 2V
2 series · 2 of 2 positions shown · non-contrast
Comparison: 11/22/2015

CLINICAL DATA: Shortness breath and cough

EXAM:
CHEST  2 VIEW

[w chest lat]
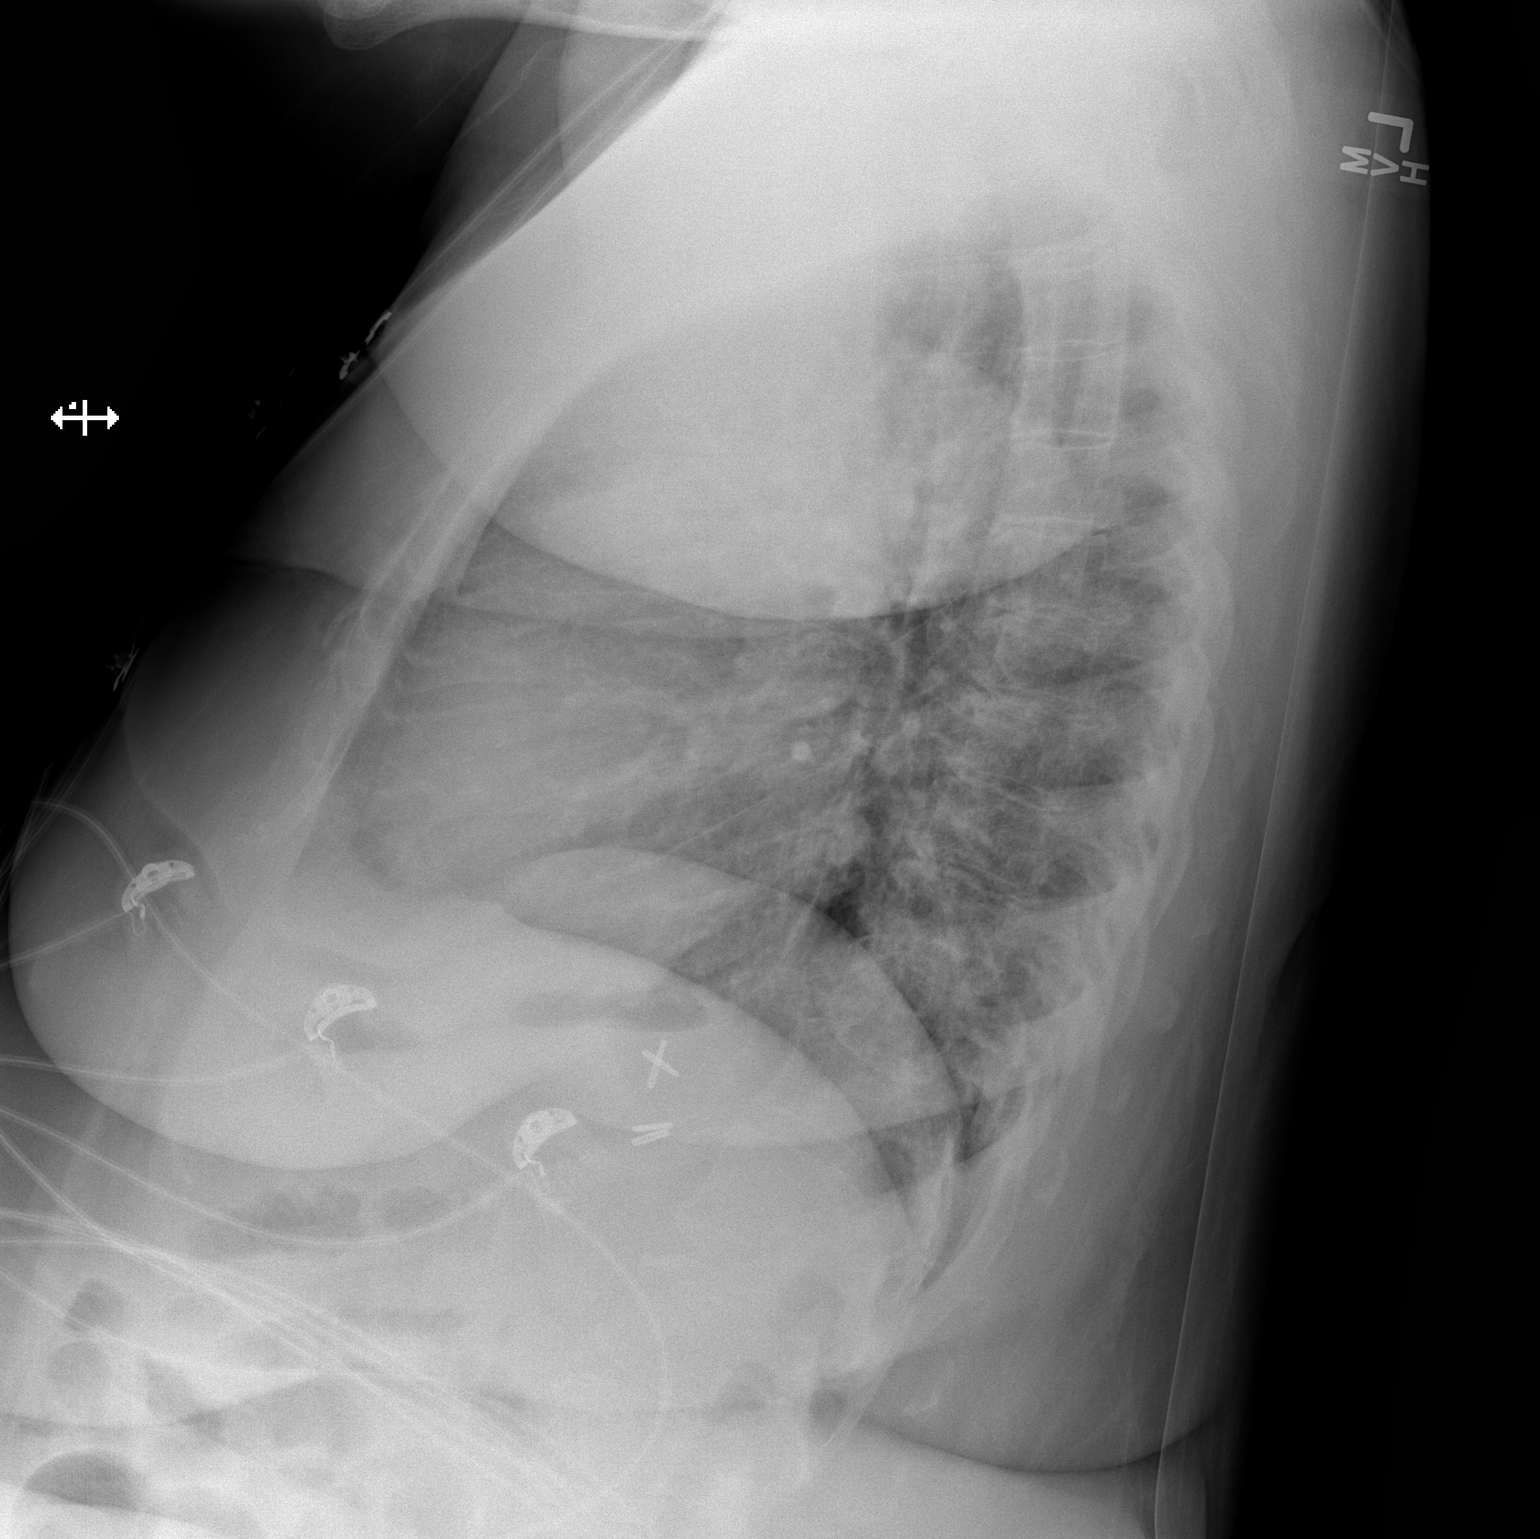

[x chest ap]
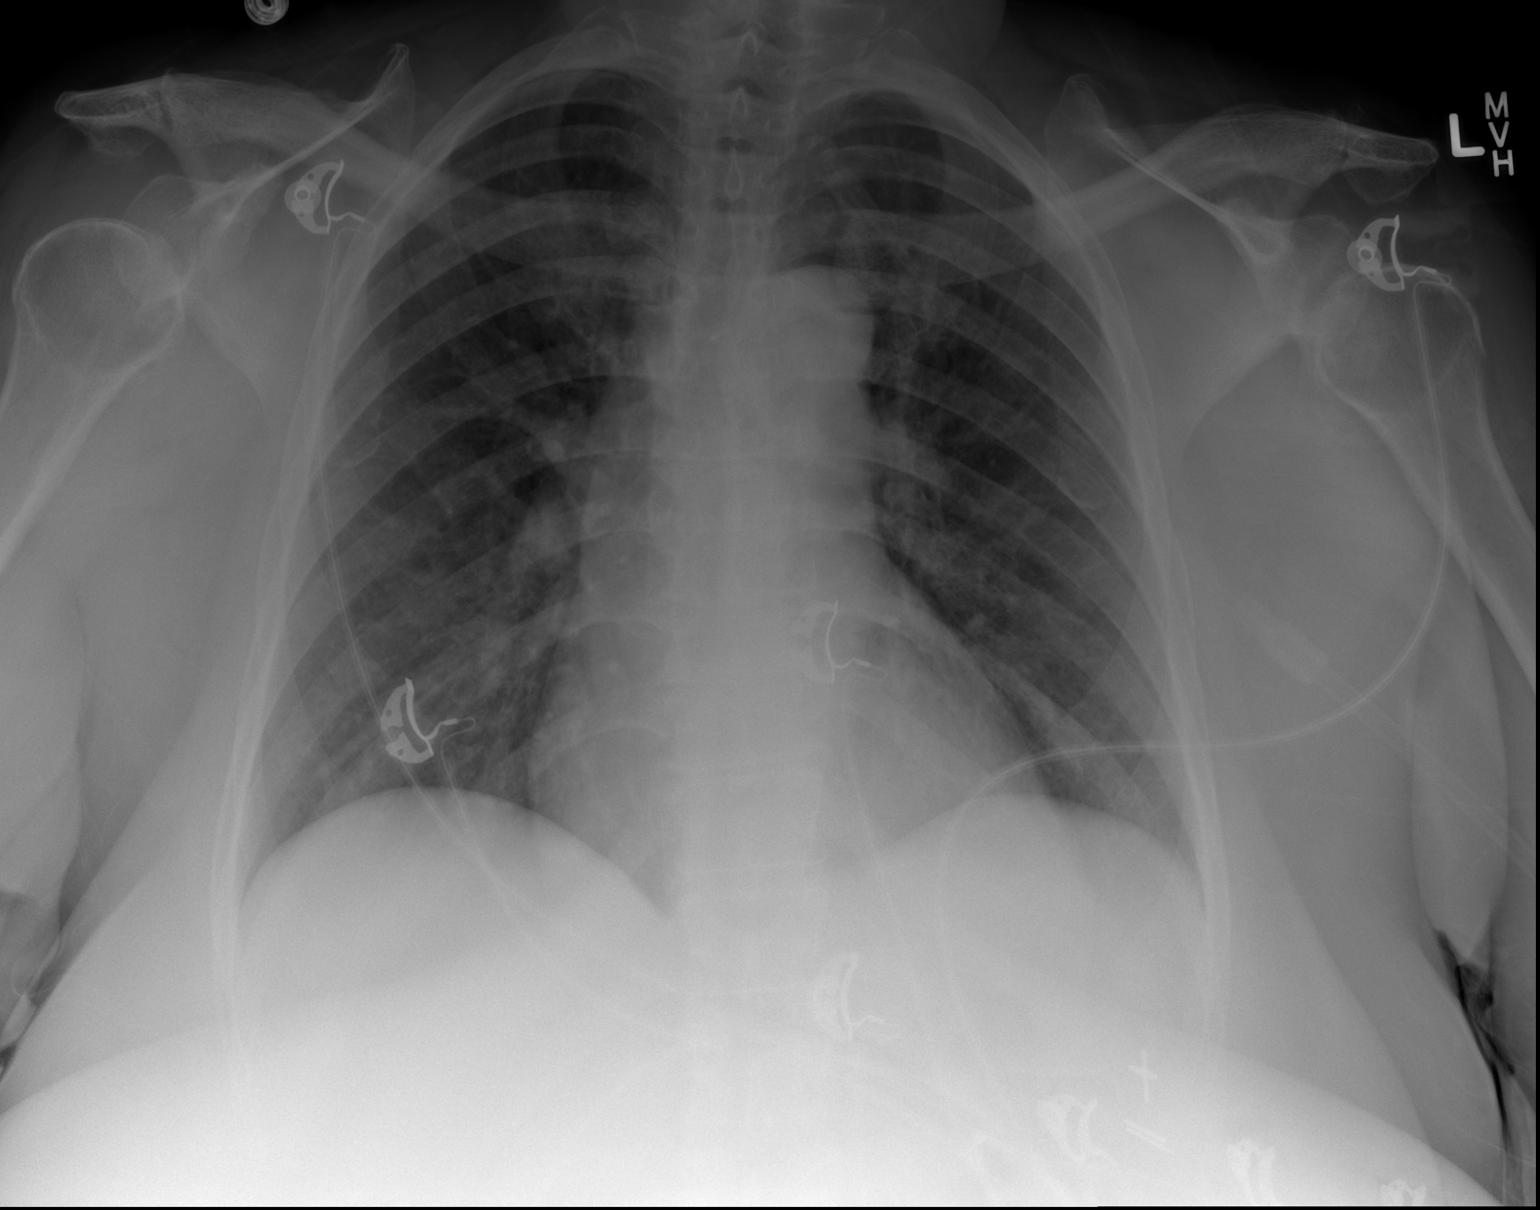

[2 of 2 positions shown; findings below may reference images not displayed]

FINDINGS: Cardiac shadow is stable. Lungs are well aerated bilaterally. No
focal infiltrate or sizable effusion is seen. Degenerative change of
the thoracic spine is noted.
IMPRESSION: No acute abnormality seen.

## 2018-05-21 IMAGING — DX DG LUMBAR SPINE COMPLETE 4+V
5 series · 5 of 5 positions shown · non-contrast
Comparison: CT 10/26/2014

CLINICAL DATA: Fall

EXAM:
LUMBAR SPINE - COMPLETE 4+ VIEW

[l-spine ap]
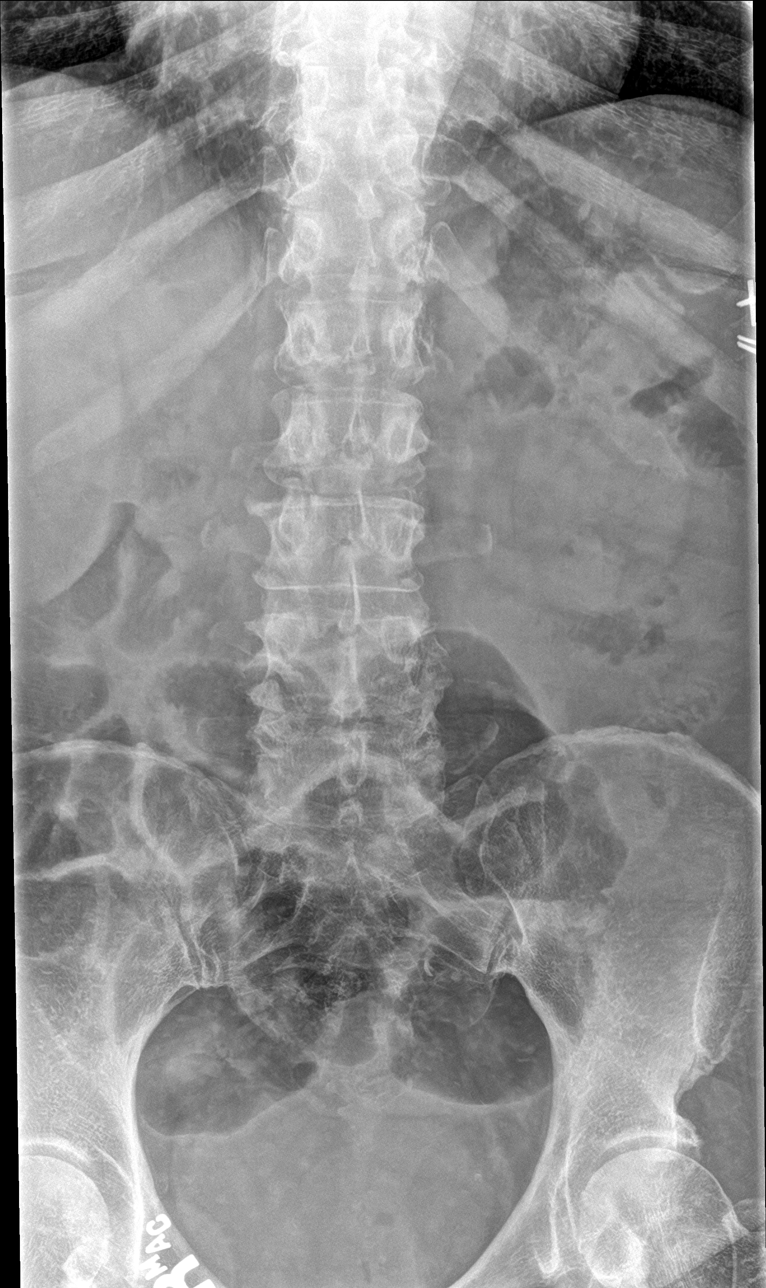

[l-spine obl (1 of 2)]
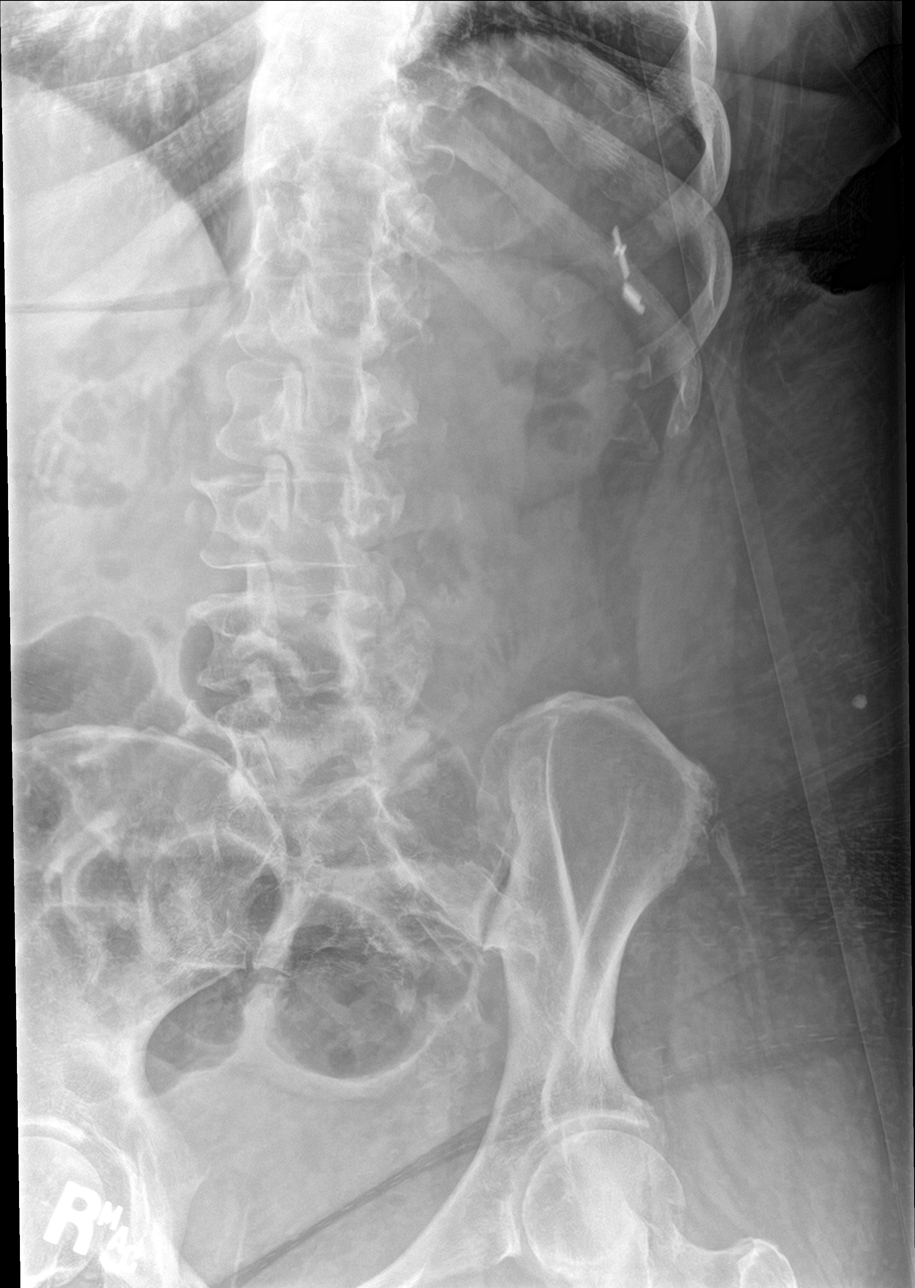

[l-spine obl (2 of 2)]
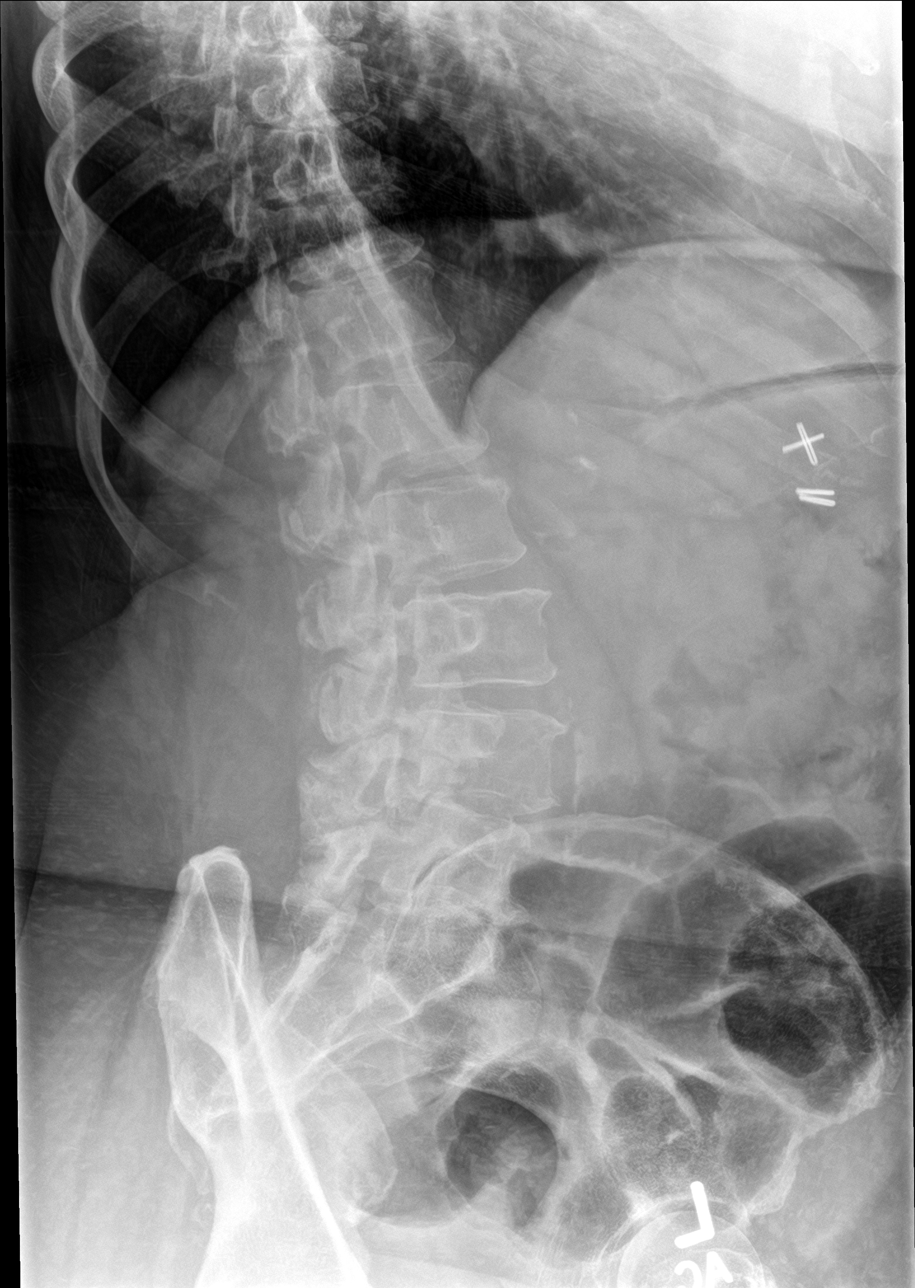

[l-spine lat]
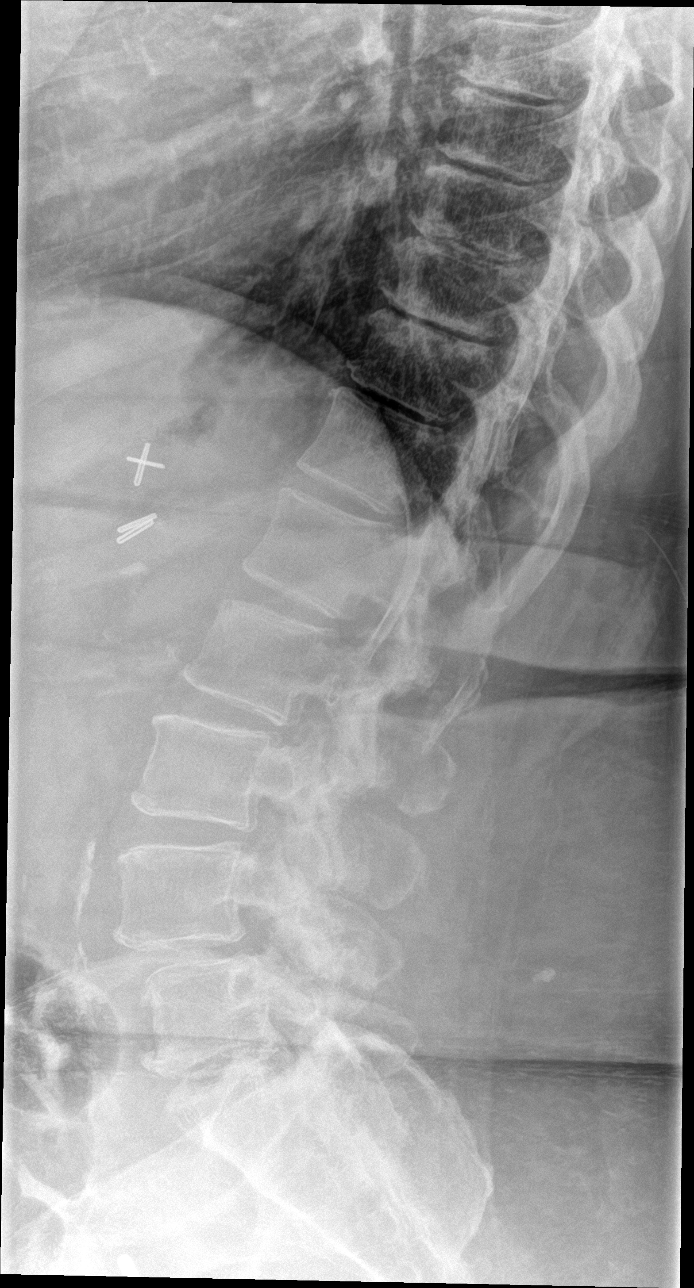

[l-spine spot]
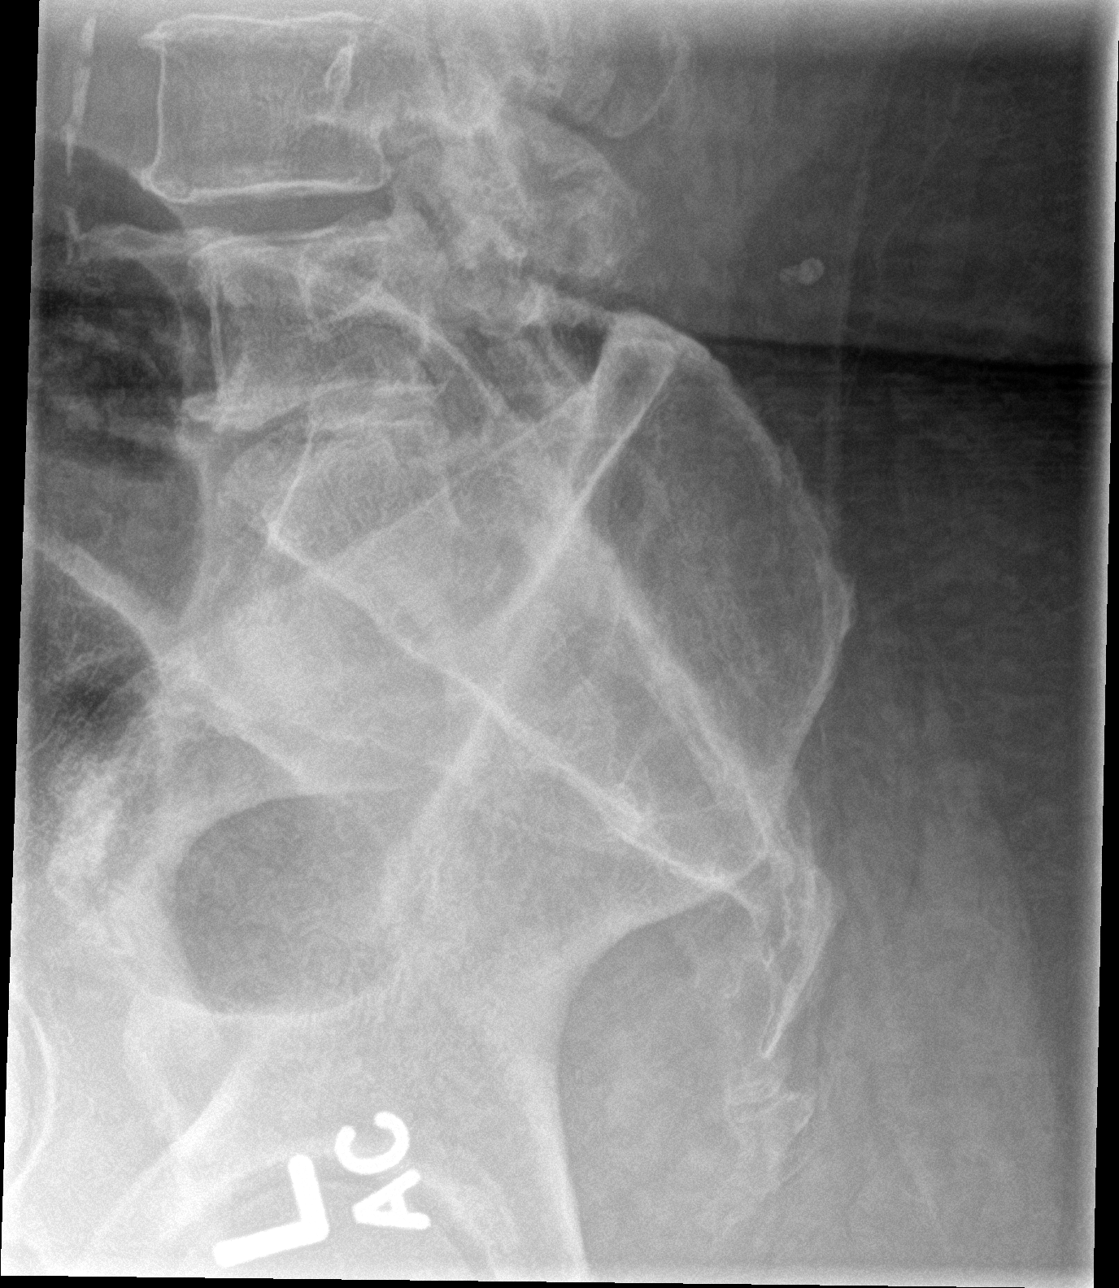

[5 of 5 positions shown; findings below may reference images not displayed]

FINDINGS: Five non rib-bearing lumbar type vertebra. The SI joints are grossly
patent. Lumbar alignment demonstrates grade 1 anterolisthesis of L4
on L5. Moderate narrowing at L5-S1 with osteophytes. Mild to
moderate narrowing L4-L5. Vertebral body heights are grossly
maintained. There is questionable lucency within posterior elements
on 1 of the oblique views at L4.
IMPRESSION: 1. Grade 1 anterolisthesis of L4 on L5. Multilevel degenerative
changes most notable at L4-L5 and L5-S1
2. Questionable lucency within the posterior elements at L4 on 1 of
the oblique views. CT could be obtained to exclude a fracture.

## 2018-05-21 IMAGING — CT CT L SPINE W/O CM
4 of 6 series · 15 of 33 positions shown, 17 images · non-contrast
Comparison: 09/09/2016 lumbar spine radiographs. 10/26/2014 CT
abdomen and pelvis.

CLINICAL DATA: 61 y/o F; status post fall with concern for lumbar
spine fracture.

EXAM:
CT LUMBAR SPINE WITHOUT CONTRAST
TECHNIQUE: Multidetector CT imaging of the lumbar spine was performed without
intravenous contrast administration. Multiplanar CT image
reconstructions were also generated.

[Series 7: l spine soft · axial · 0.34mm/px · z∈[-338,-186]mm · 5 of 133 slices shown]
[im 19/133  soft-tissue]
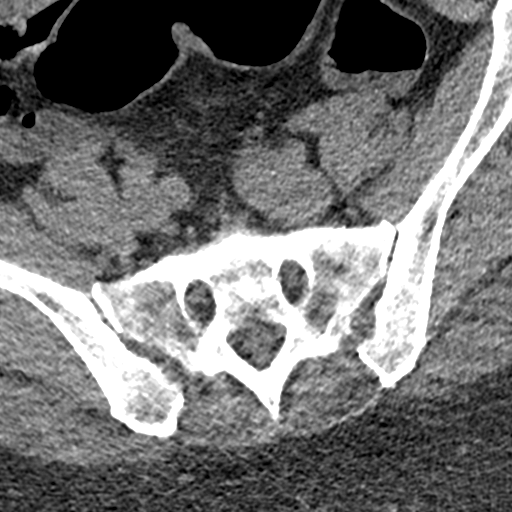
[im 38/133  soft-tissue]
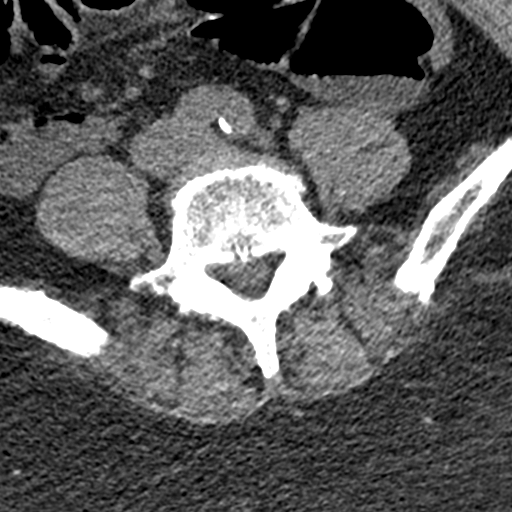
[im 57/133  soft-tissue]
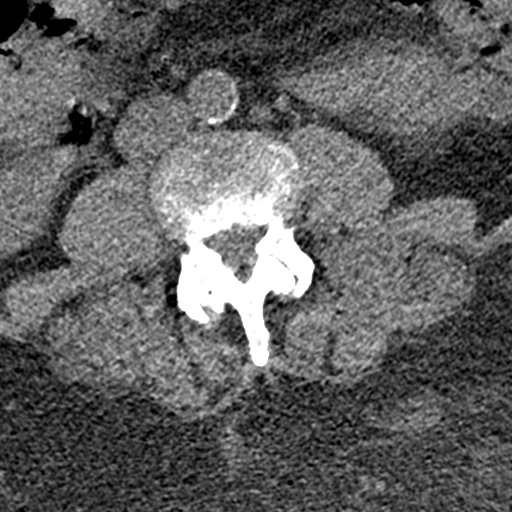
[im 76/133  soft-tissue]
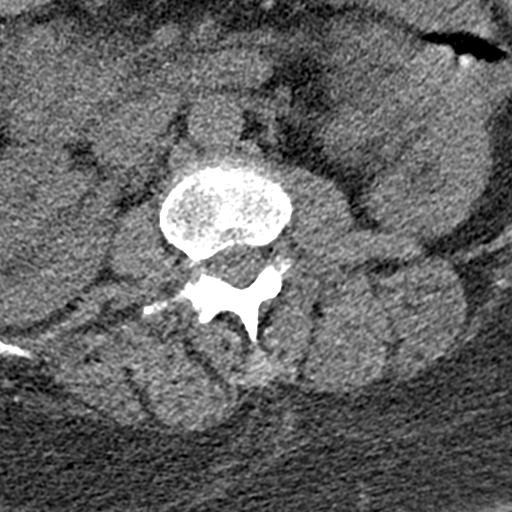
[im 95/133  soft-tissue]
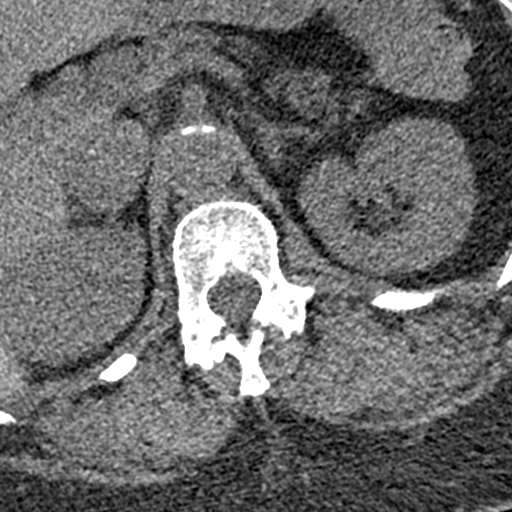

[Series 8: sagittal bone · sagittal · 0.39mm/px · 5 of 57 slices shown, 6 images]
[im 19/57  bone]
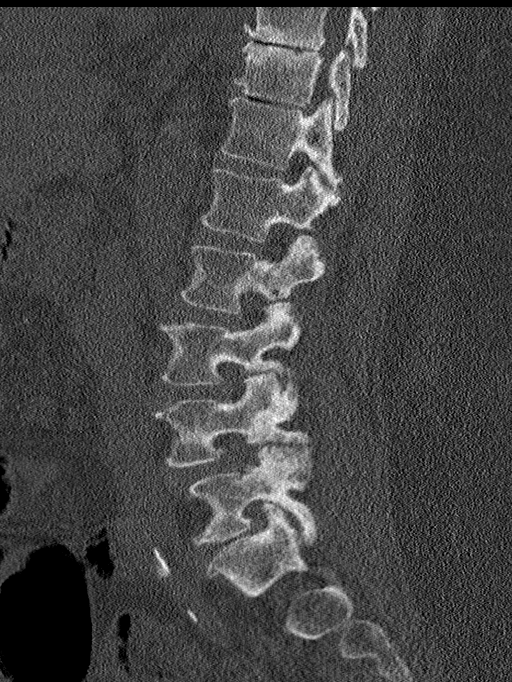
[im 24/57  bone]
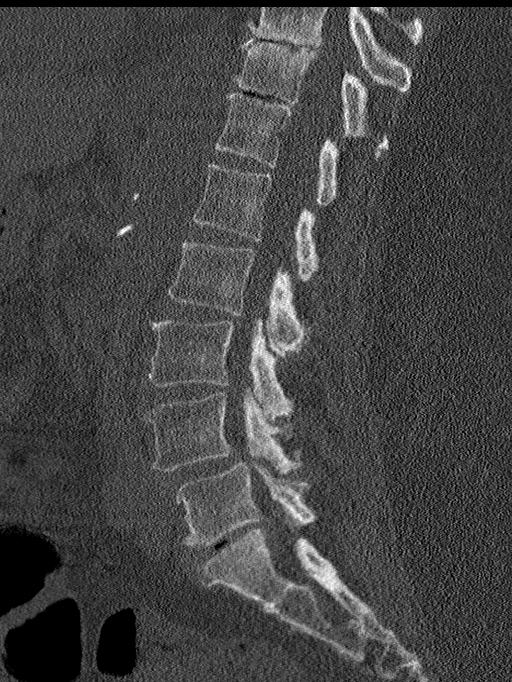
[im 29/57  soft-tissue]
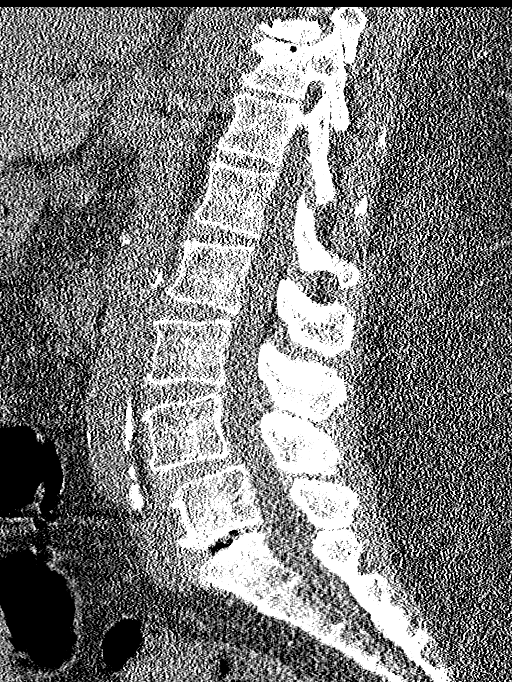
[im 29/57  bone]
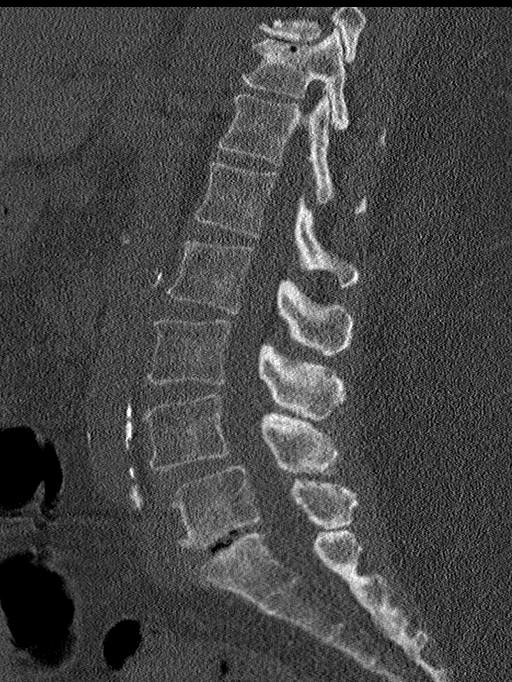
[im 33/57  bone]
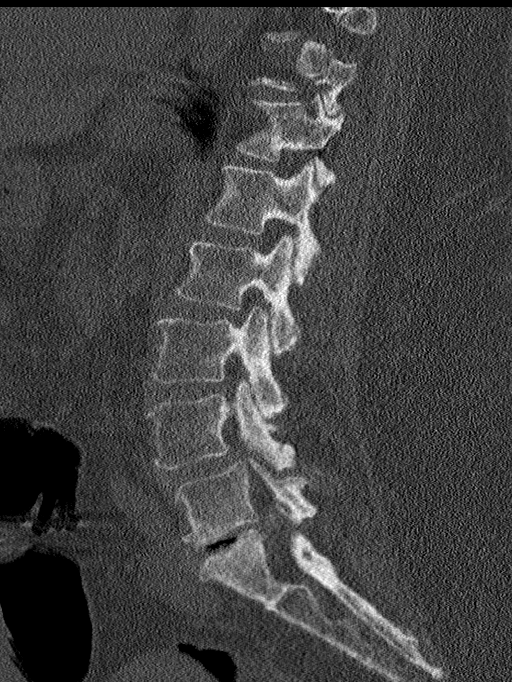
[im 38/57  bone]
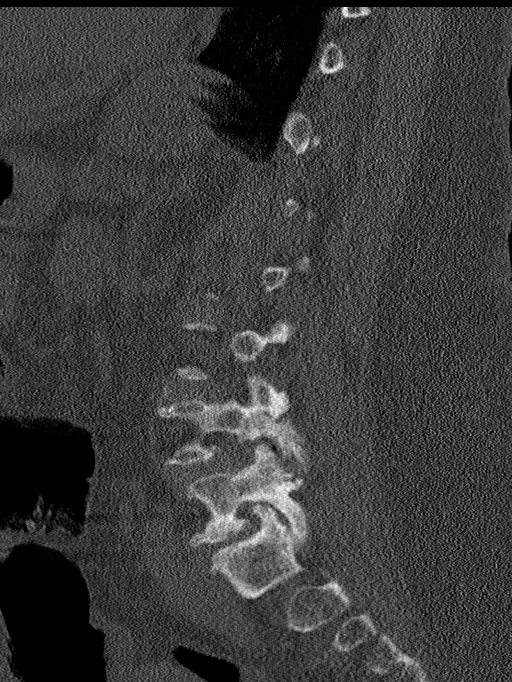

[Series 9: coronal upper · coronal · 0.39mm/px · 2 of 61 slices shown]
[im 21/61  bone]
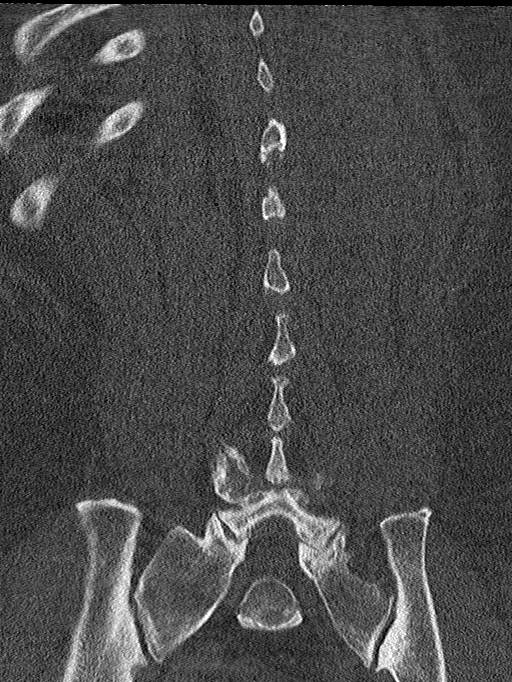
[im 41/61  bone]
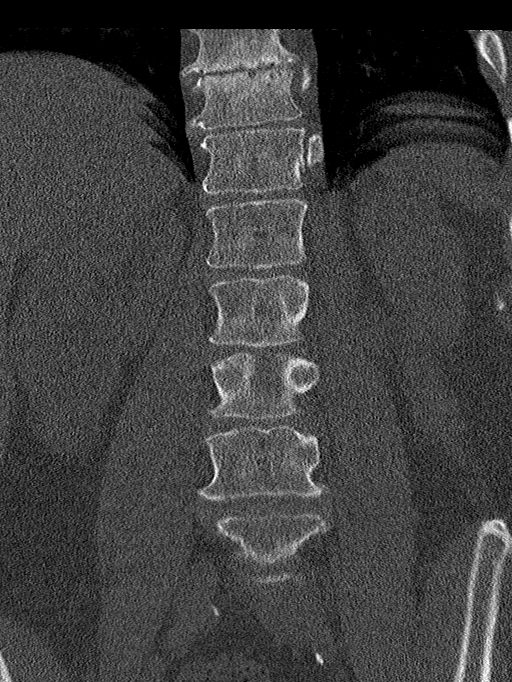

[Series 602: <mpr thick range> · axial · 0.52mm/px · z∈[-279,-151]mm · 3 of 66 slices shown, 4 images]
[im 1/66  soft-tissue]
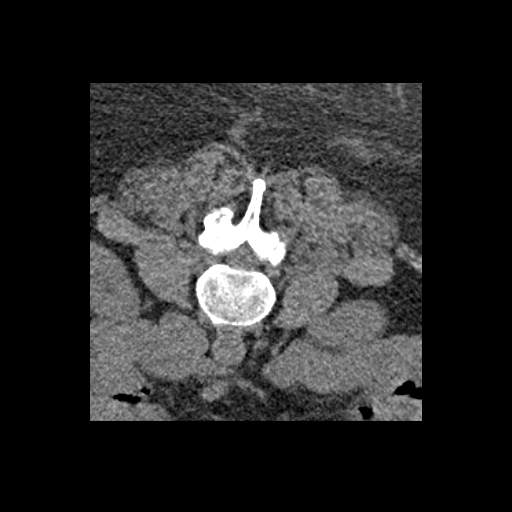
[im 1/66  bone]
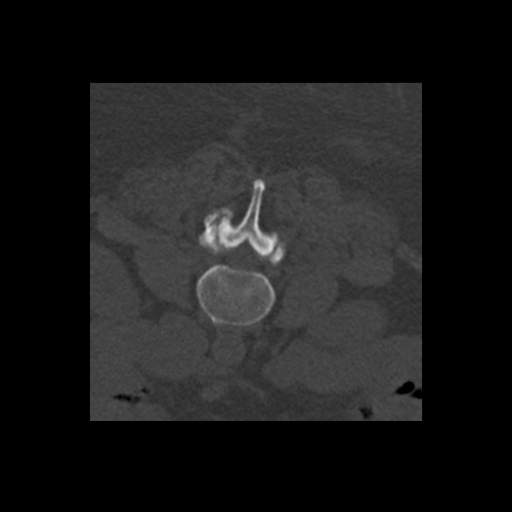
[im 33/66  bone]
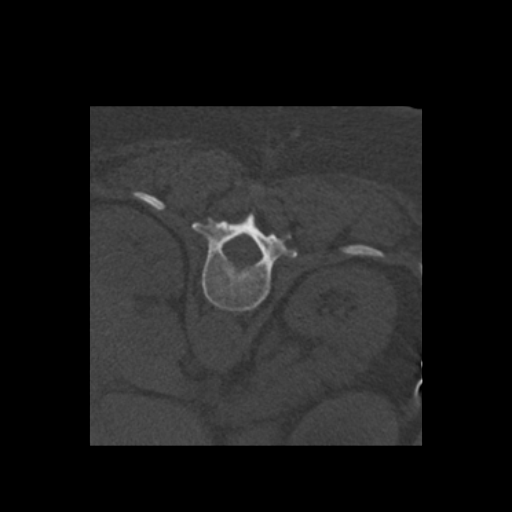
[im 66/66  bone]
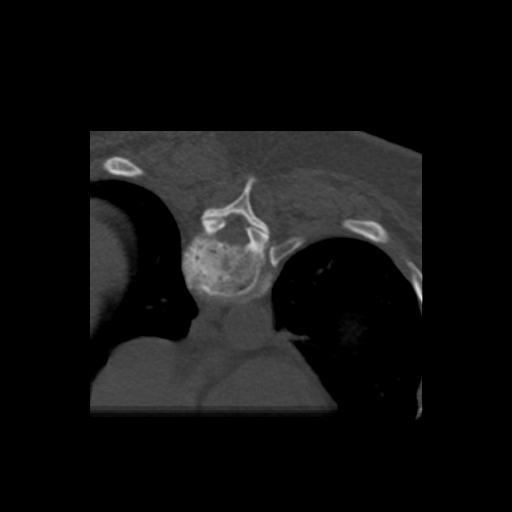

[15 of 33 positions shown; findings below may reference images not displayed]

FINDINGS: Segmentation: 5 lumbar type vertebrae.

Alignment: Grade 1 L4-5 anterolisthesis and minimal \grade 1 L5-S1
retrolisthesis is stable.

Vertebrae: No acute fracture or focal pathologic process.

Paraspinal and other soft tissues: Calcific atherosclerosis of the
abdominal aorta and bilateral iliofemoral arteries. Left kidney
interpolar cyst measuring 26 mm.

Disc levels: Mild disc space narrowing at L3-4 and moderate to
severe disc space narrowing at L4-5. Advanced lower lumbar facet
arthrosis. T10-11 progressed severe discogenic degenerative changes
with loss of disc space height and endplate irregularity. Canal
stenosis greatest at the L4-5 level where it is at least moderate.
IMPRESSION: 1. No acute fracture or dislocation identified.
2. Stable grade 1 L4-5 anterolisthesis and minimal grade 1 L5-S1
retrolisthesis.
3. Stable lumbar degenerative changes greatest at the L4-5 level
where there is at least moderate canal stenosis.

By: Holledyne Kassandy M.D.

## 2018-05-21 IMAGING — DX DG CHEST 2V
2 series · 2 of 2 positions shown · non-contrast
Comparison: 08/07/2016

CLINICAL DATA: Fall

EXAM:
CHEST  2 VIEW

[chest lat]
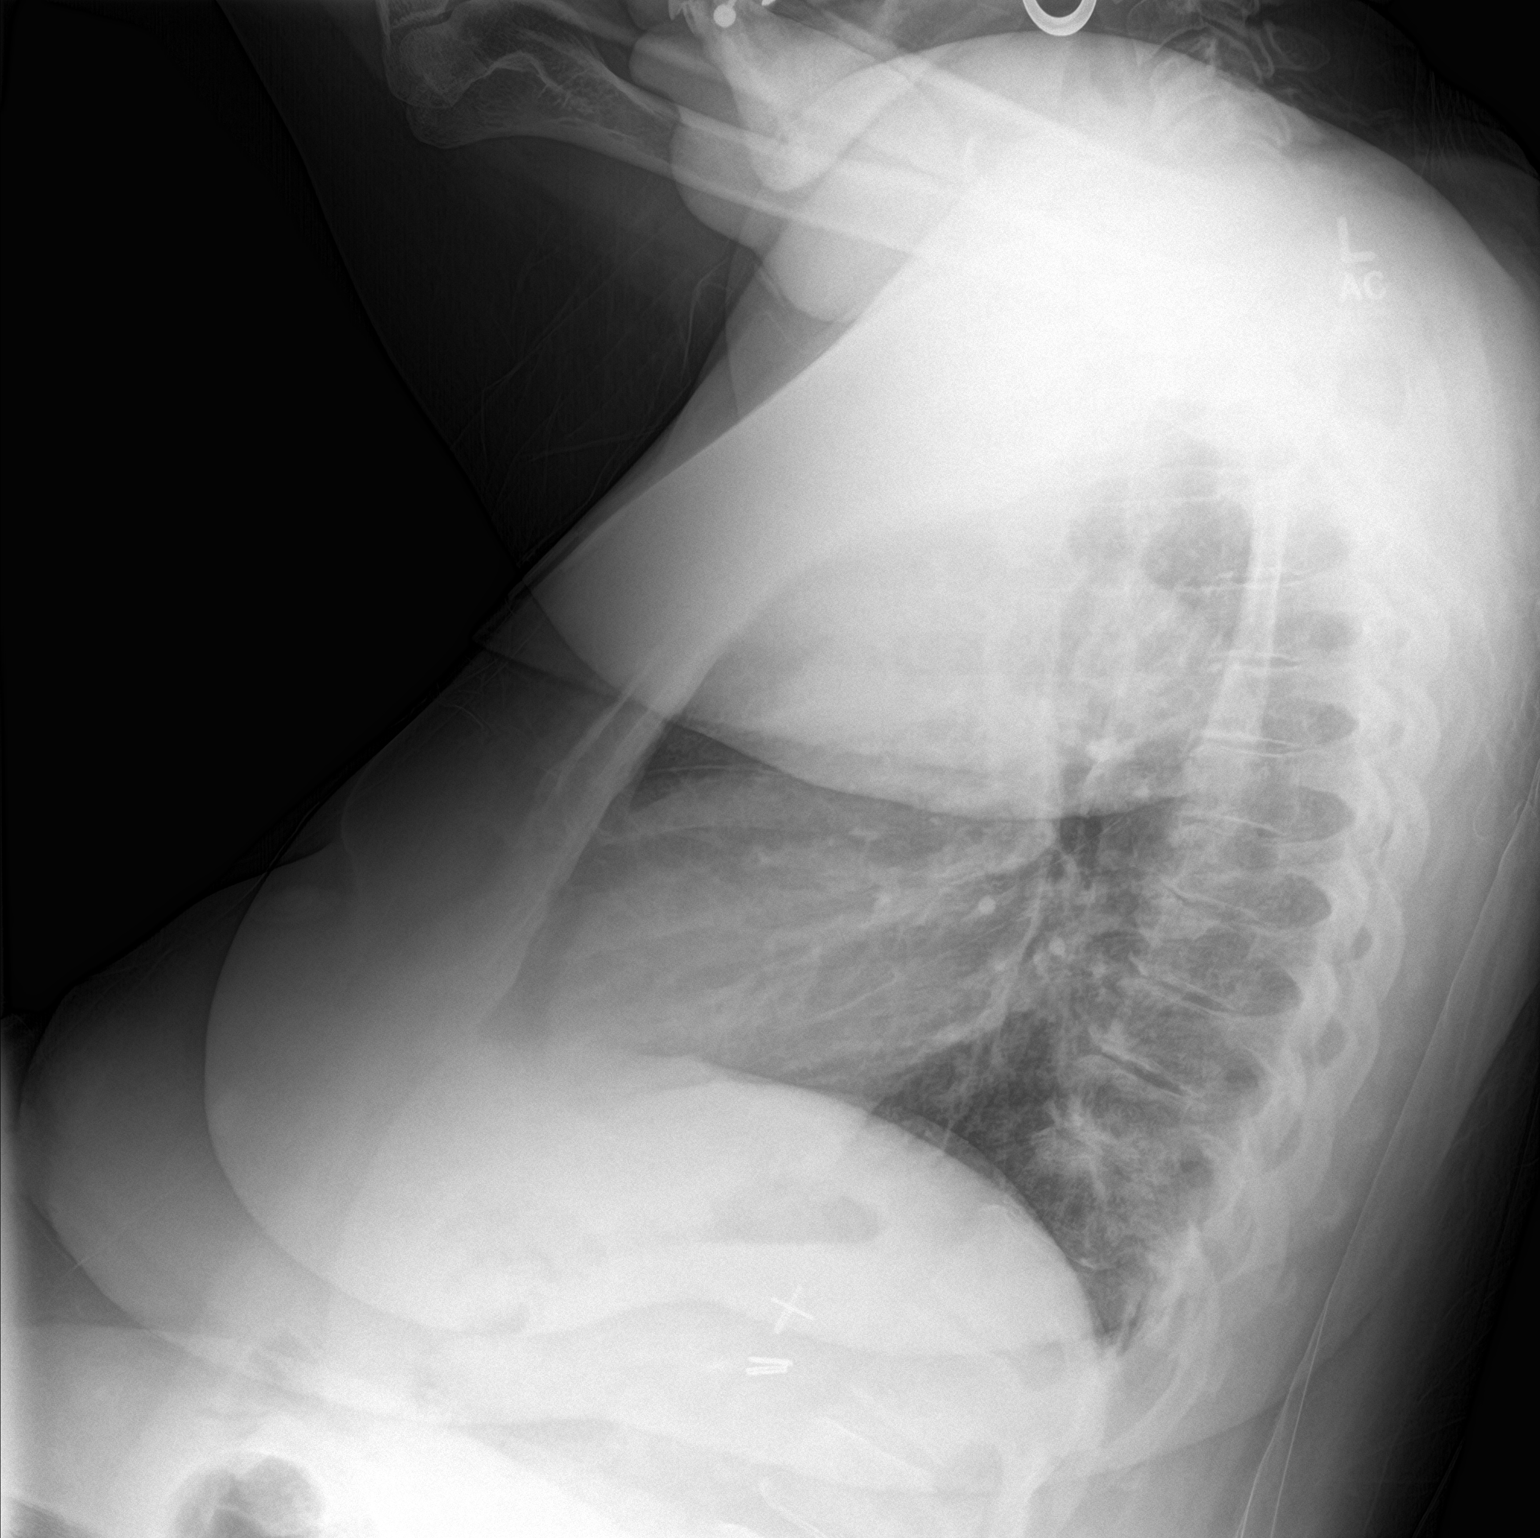

[chest ap]
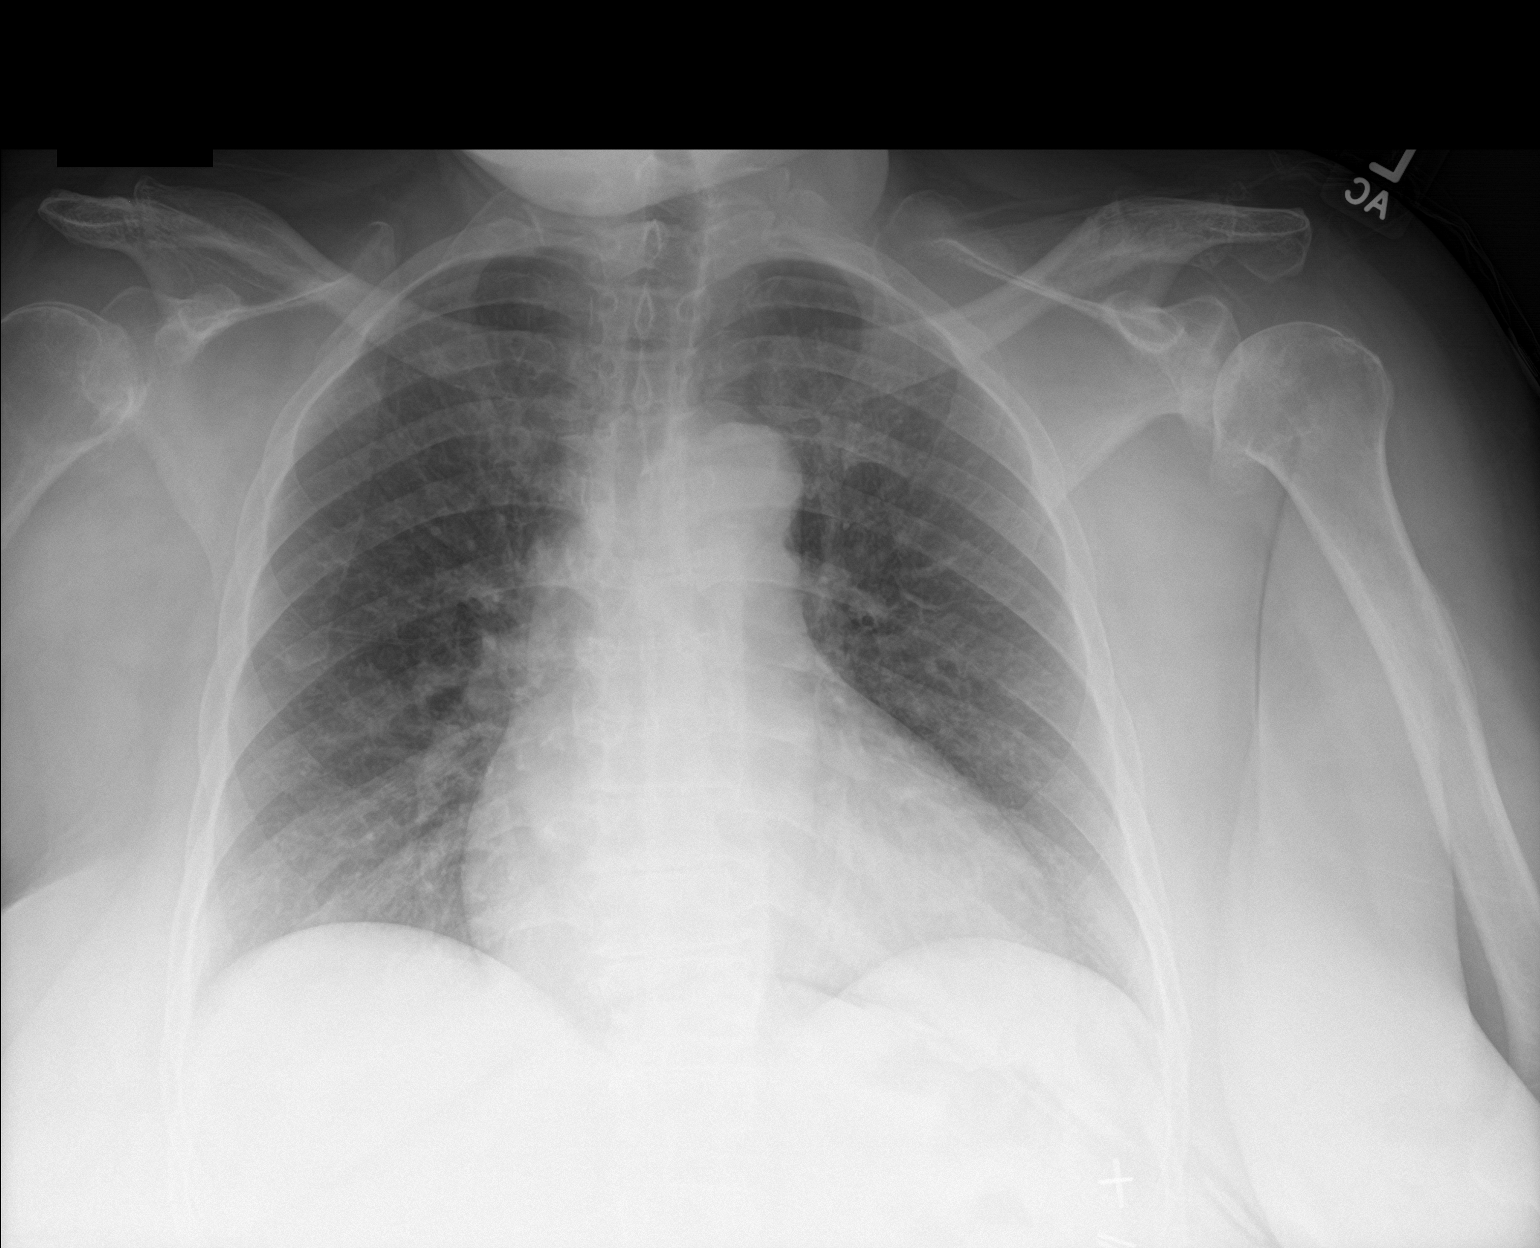

[2 of 2 positions shown; findings below may reference images not displayed]

FINDINGS: No acute infiltrate or effusion. There is mild cardiomegaly. There
is no pneumothorax. There are surgical clips in the upper abdomen.
IMPRESSION: 1. No acute infiltrate or edema
2. Mild cardiomegaly without overt failure

## 2018-05-21 IMAGING — DX DG SHOULDER 2+V*L*
5 series · 5 of 5 positions shown · non-contrast
Comparison: 01/24/2012

CLINICAL DATA: Fall with shoulder pain

EXAM:
LEFT SHOULDER - 2+ VIEW

[shoulder grashey]
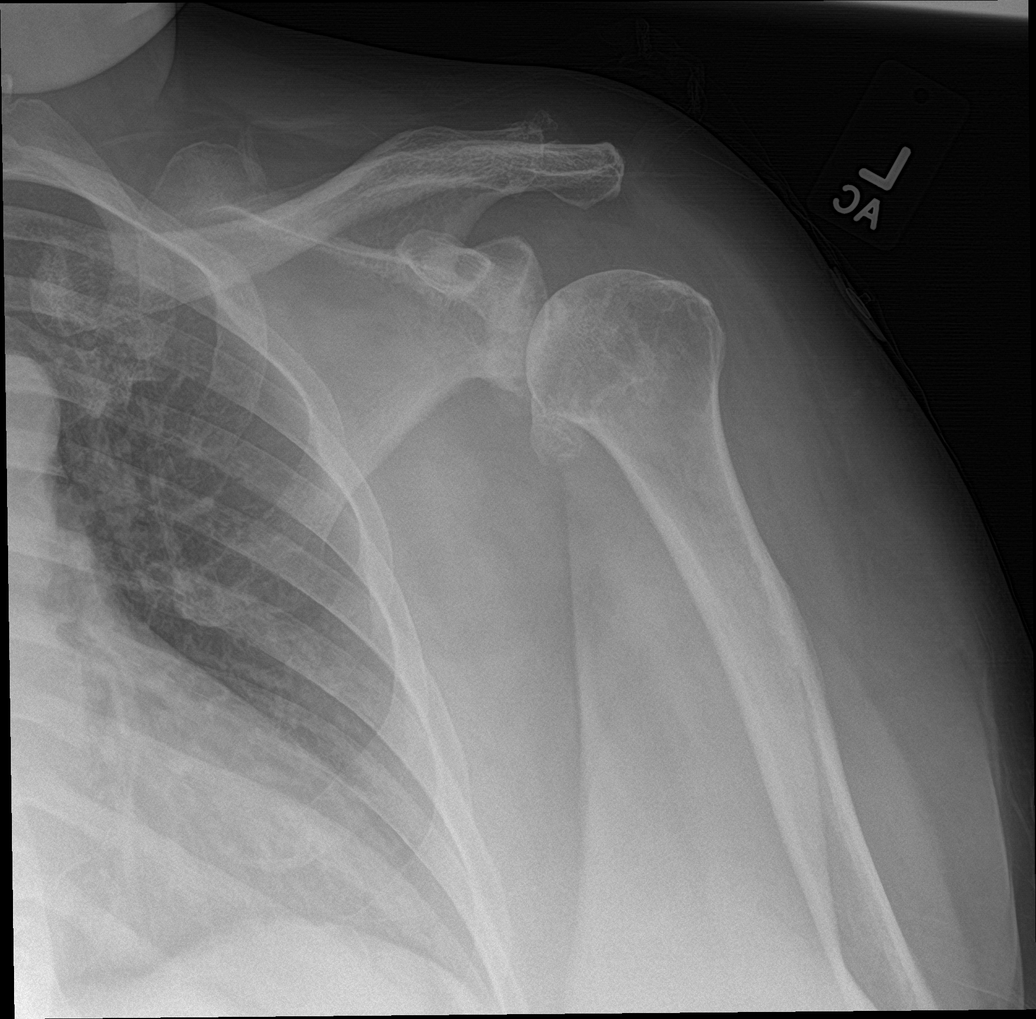

[shoulder y view (1 of 2)]
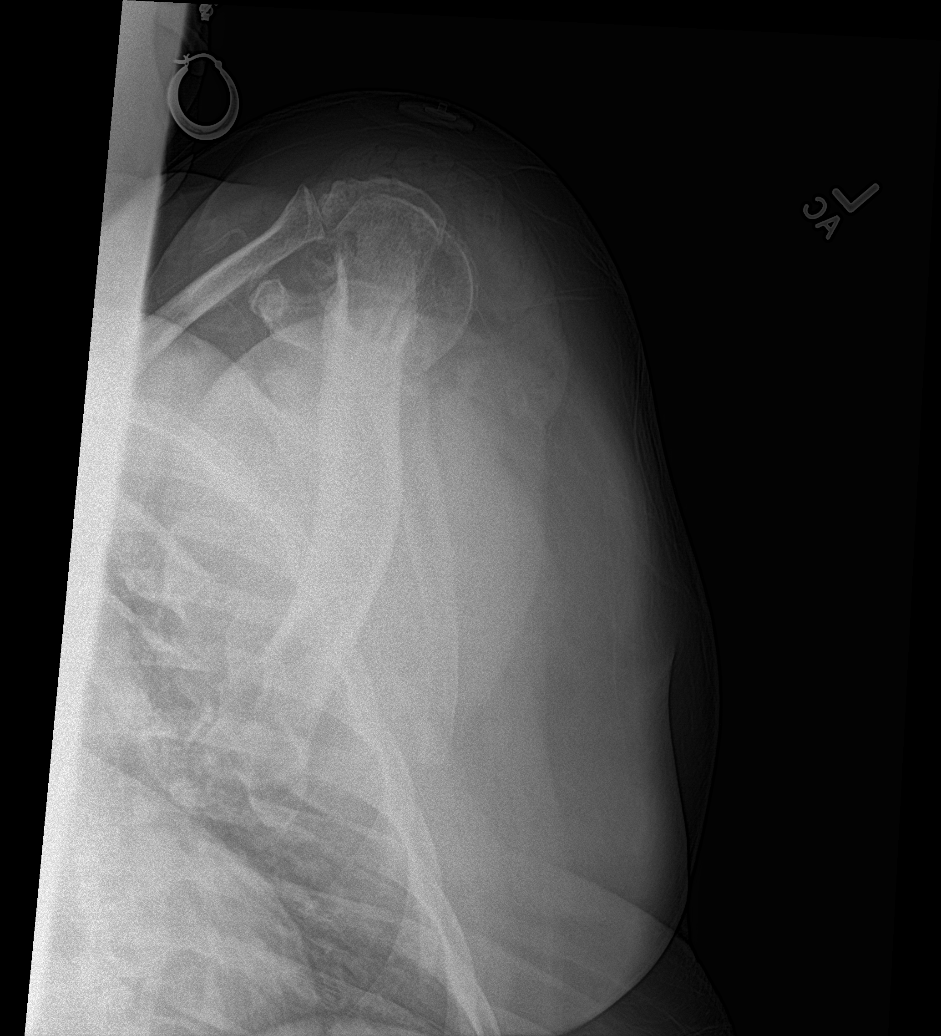

[shoulder axillary (1 of 2)]
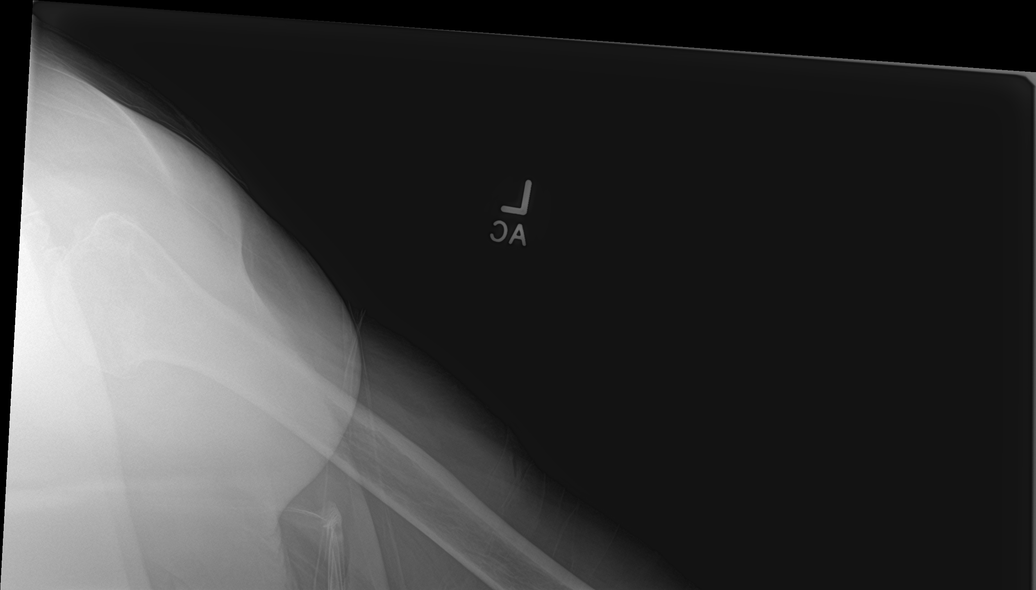

[shoulder y view (2 of 2)]
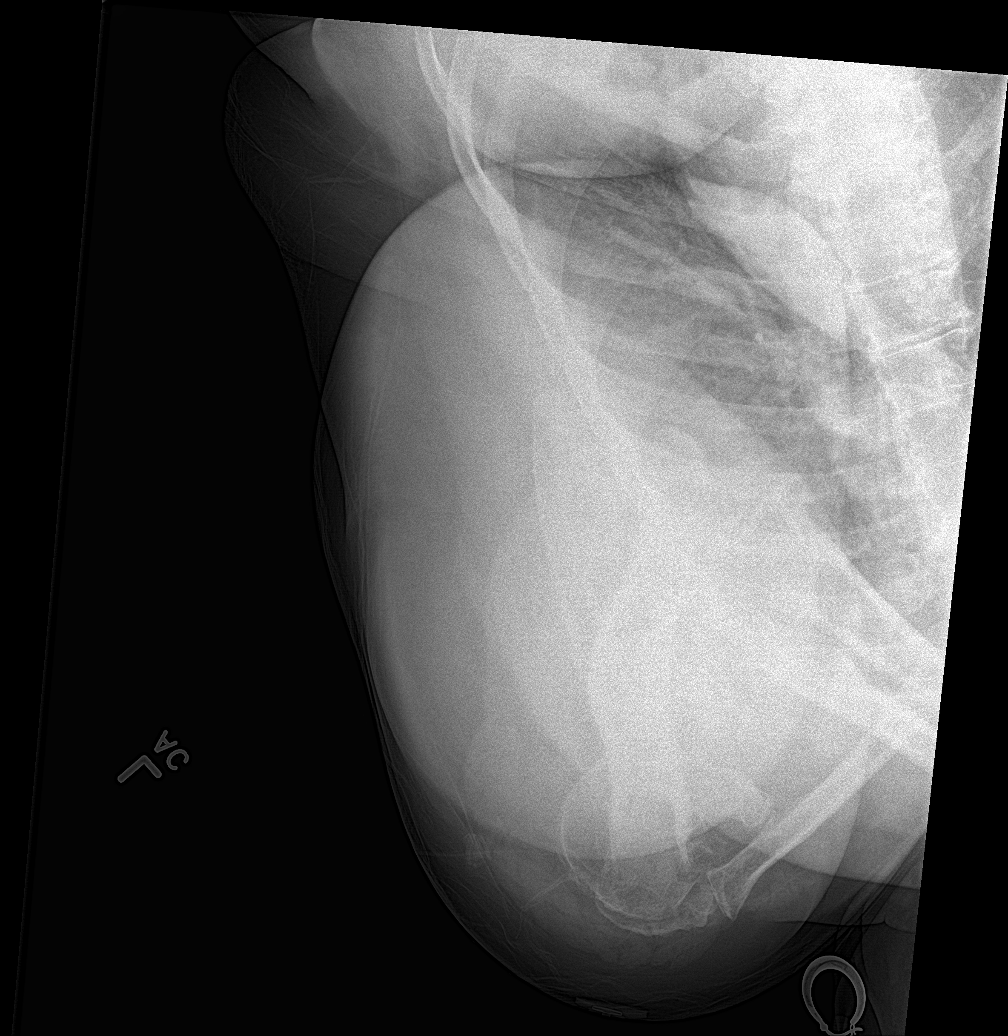

[shoulder axillary (2 of 2)]
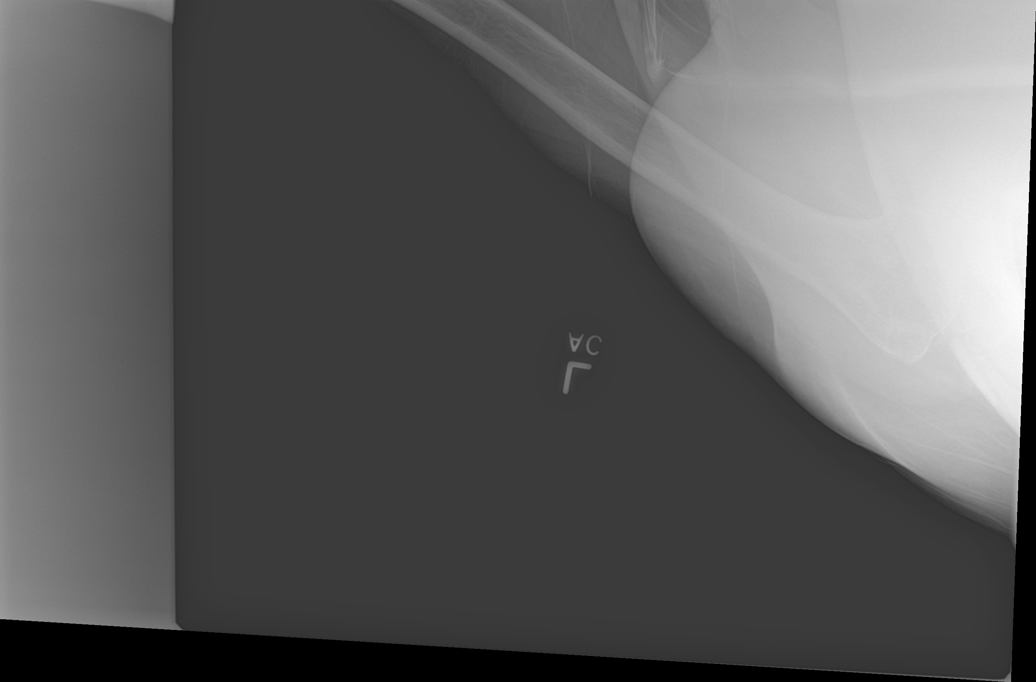

[5 of 5 positions shown; findings below may reference images not displayed]

FINDINGS: Limited axillary view due to positioning. There is no gross fracture
or dislocation. Marked arthritis at the glenohumeral interval with
large bony spurring. There is slight inferior positioning of the
left humeral head with respect to the glenoid, possibly related to
effusion. The left lung apex is clear. AC joint degenerative change
IMPRESSION: 1. No fracture is visualized. There is slight inferior positioning
of the left humeral head which could be due to effusion.
2. Advanced arthritis of the glenohumeral joint.

## 2018-05-21 IMAGING — CT CT HEAD W/O CM
3 of 4 series · 14 of 47 positions shown, 16 images · non-contrast
Comparison: 11/22/2015

CLINICAL DATA: Right arm weakness.  Fell yesterday.

EXAM:
CT HEAD WITHOUT CONTRAST
TECHNIQUE: Contiguous axial images were obtained from the base of the skull
through the vertex without intravenous contrast.

[Series 2: head wo · axial · 0.42mm/px · z∈[-190,-65]mm · 8 of 33 slices shown, 10 images]
[im 4/33  brain]
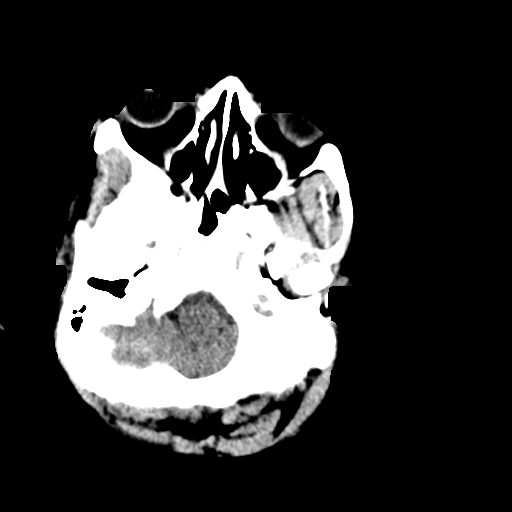
[im 4/33  bone]
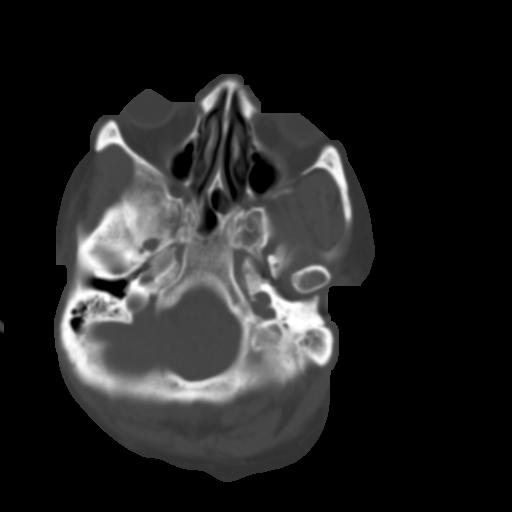
[im 7/33  brain]
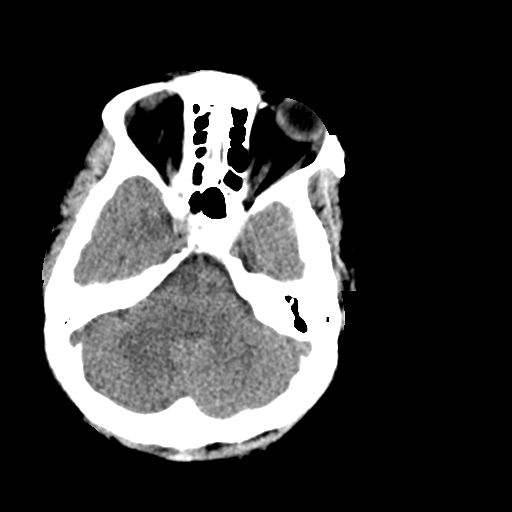
[im 10/33  brain]
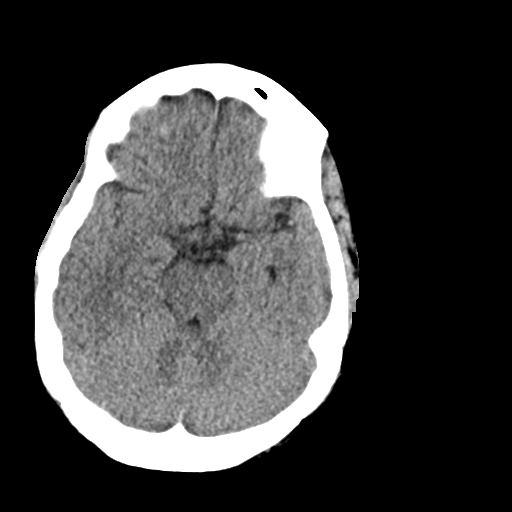
[im 13/33  brain]
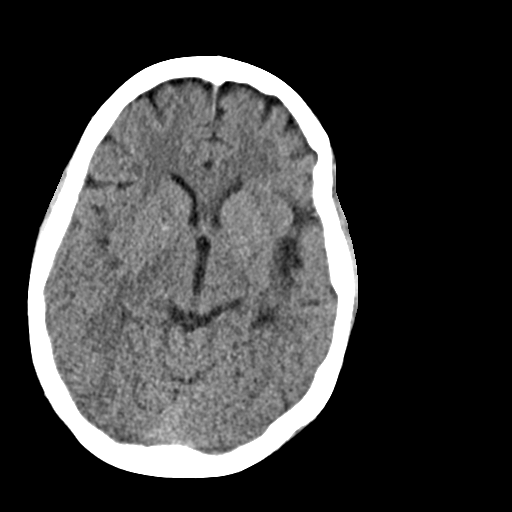
[im 20/33  brain]
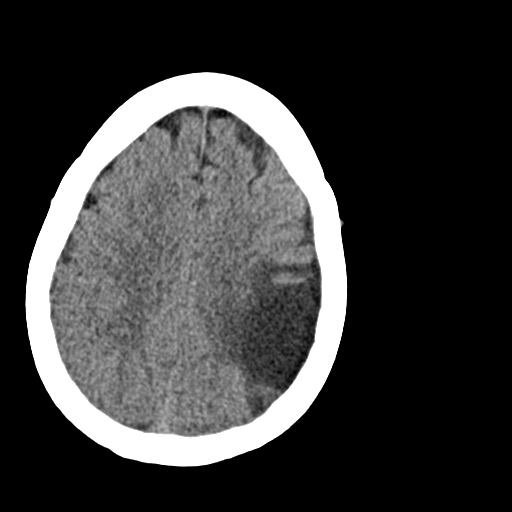
[im 20/33  bone]
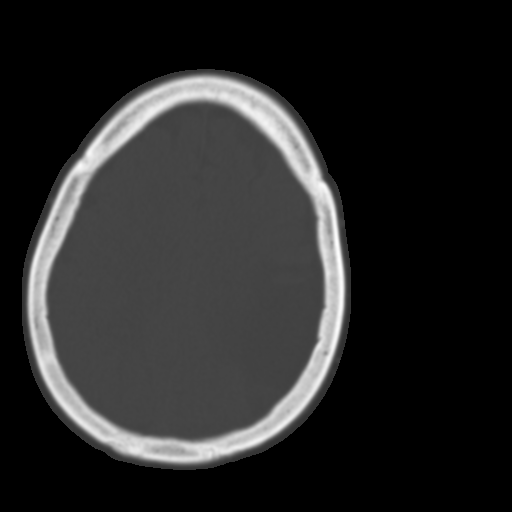
[im 23/33  brain]
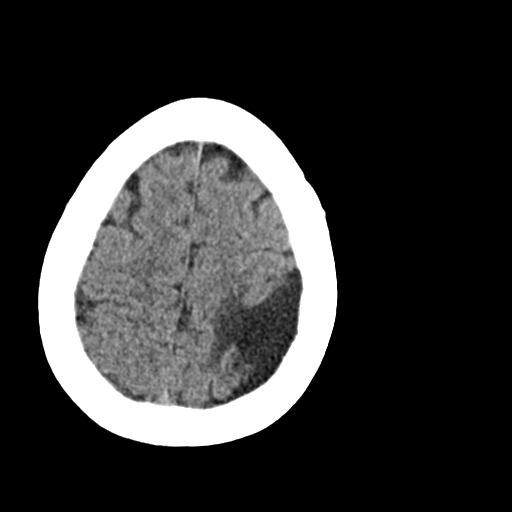
[im 26/33  brain]
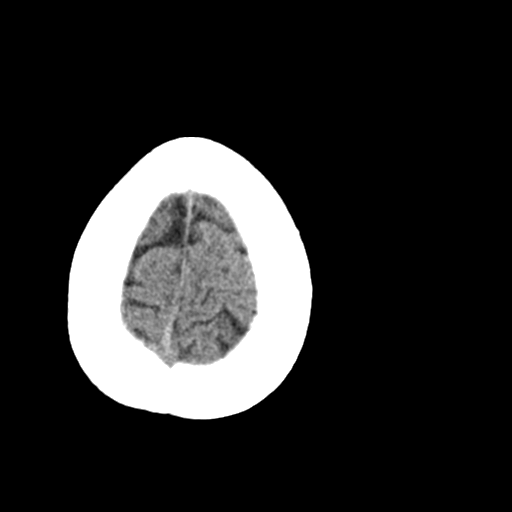
[im 29/33  brain]
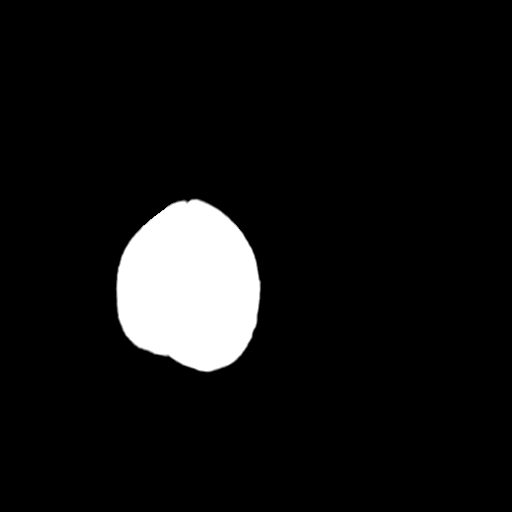

[Series 4: coronal soft · coronal · 0.30mm/px · 3 of 61 slices shown]
[im 21/61  brain]
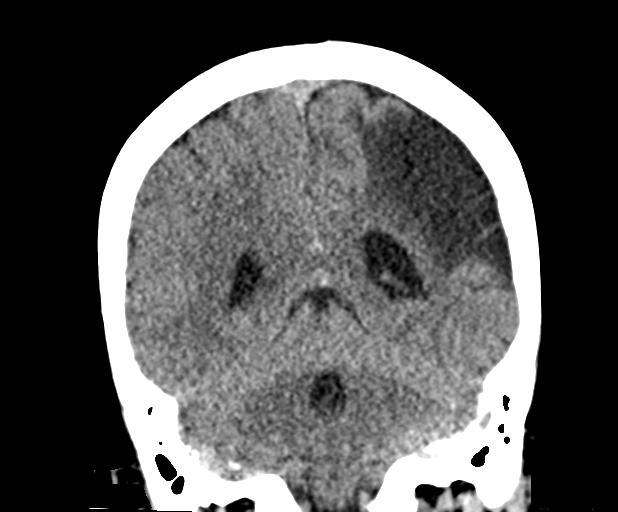
[im 27/61  brain]
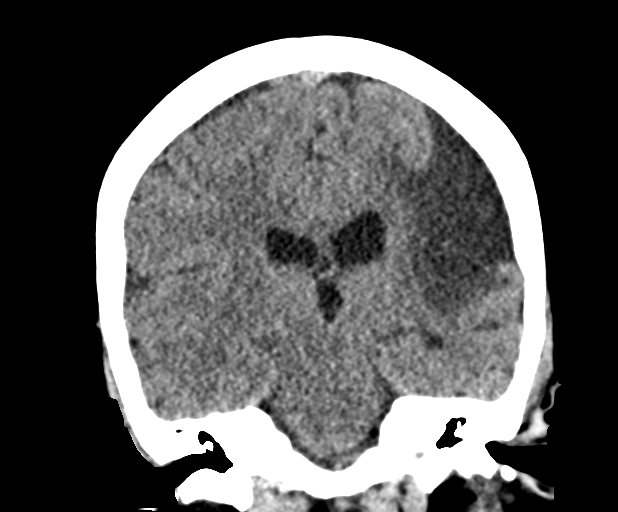
[im 34/61  brain]
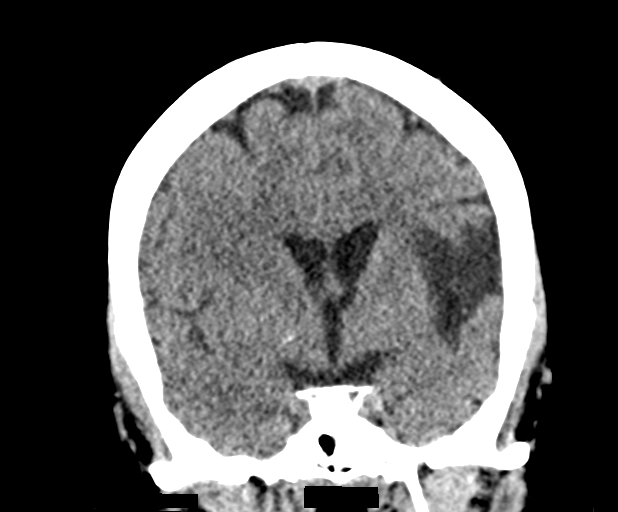

[Series 5: sag soft · sagittal · 0.31mm/px · 3 of 51 slices shown]
[im 17/51  brain]
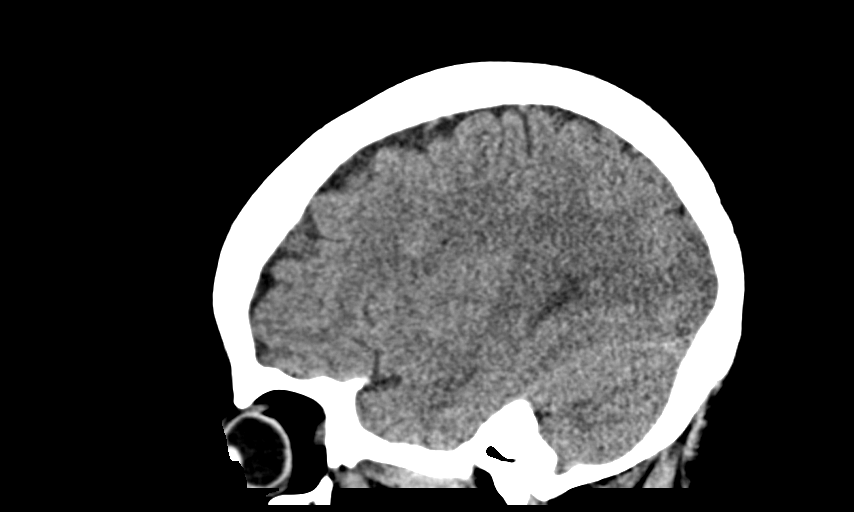
[im 26/51  brain]
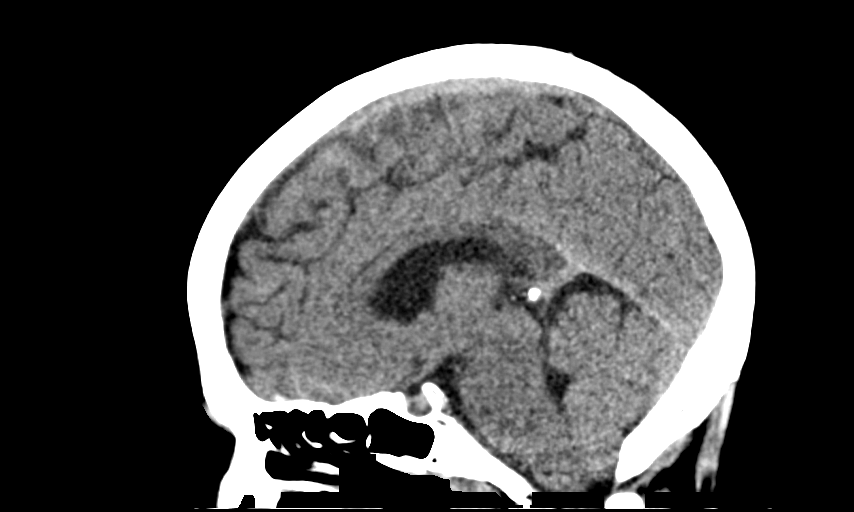
[im 34/51  brain]
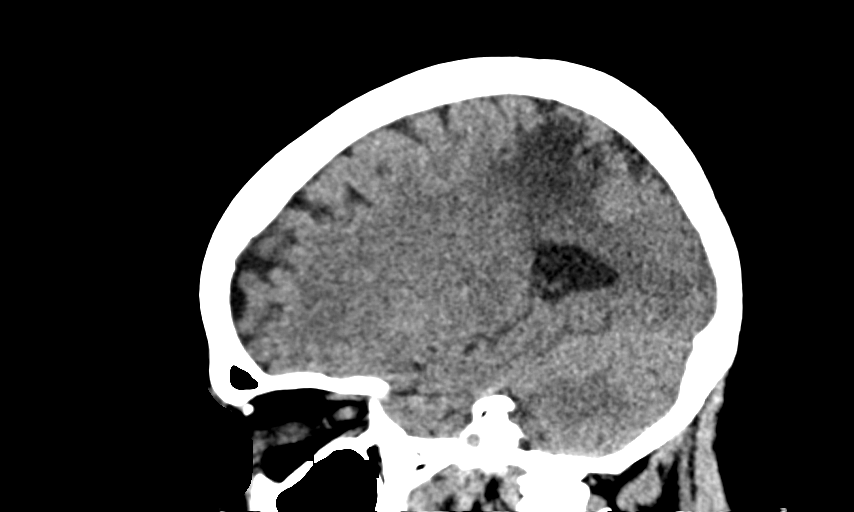

[14 of 47 positions shown; findings below may reference images not displayed]

FINDINGS: Brain: No evidence of acute infarction, hemorrhage, hydrocephalus,
extra-axial collection or mass lesion/mass effect. Remote left MCA
infarction with encephalomalacia.

Vascular: No hyperdense vessel or unexpected calcification.

Skull: Normal. Negative for fracture or focal lesion.

Sinuses/Orbits: No acute finding.

Other: None.
IMPRESSION: Remote left MCA infarction with encephalomalacia. No acute findings.
No interval change.

## 2018-12-05 IMAGING — CT CT HEAD W/O CM
4 series · 16 of 47 positions shown, 18 images · non-contrast
Comparison: 09/09/2016

CLINICAL DATA: Increasing numbness and pain to the right side of
face and body for 3 days. A few falls in the past few days. History
of stroke.

EXAM:
CT HEAD WITHOUT CONTRAST
TECHNIQUE: Contiguous axial images were obtained from the base of the skull
through the vertex without intravenous contrast.

[Series 3: head wo · axial · 0.41mm/px · z∈[-136,-6]mm · 7 of 36 slices shown, 9 images]
[im 5/36  brain]
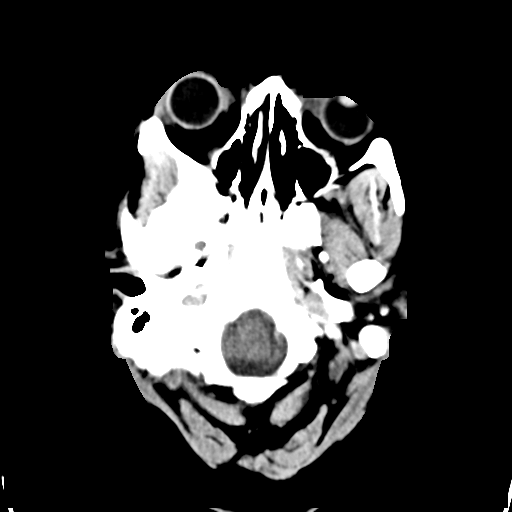
[im 5/36  bone]
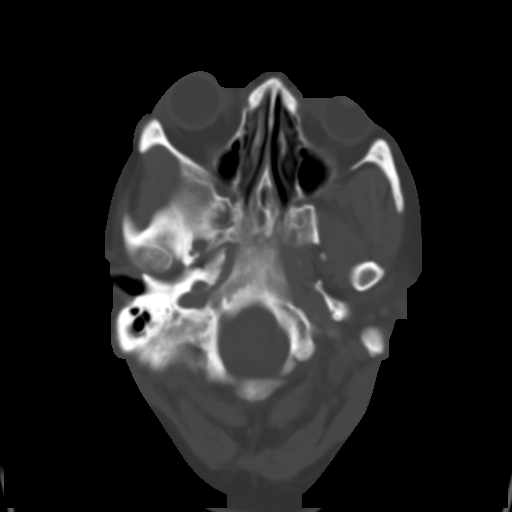
[im 9/36  brain]
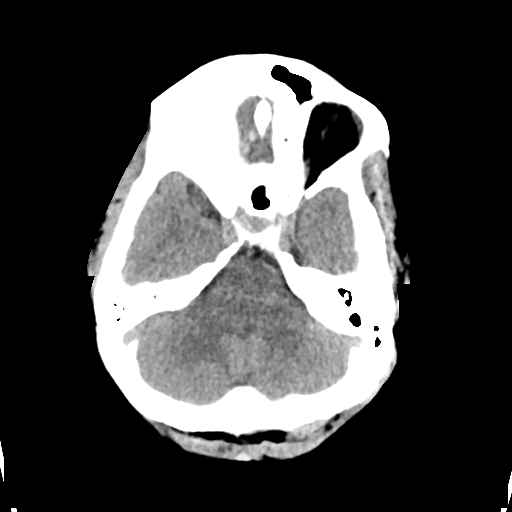
[im 14/36  brain]
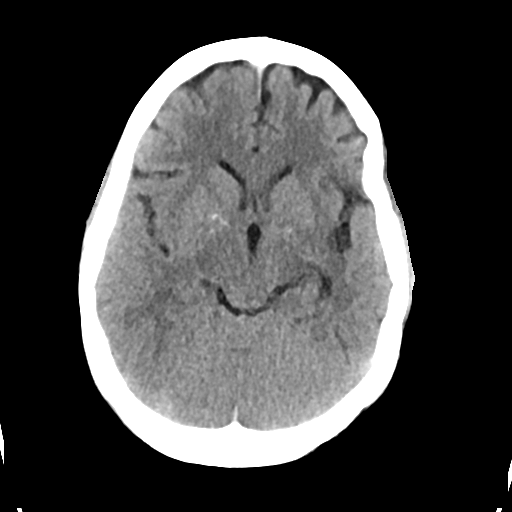
[im 18/36  brain]
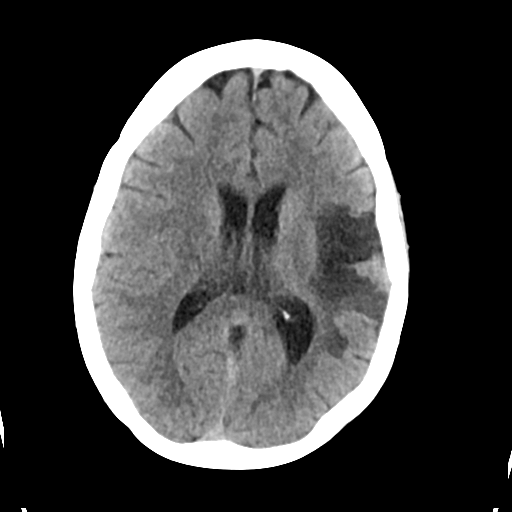
[im 22/36  brain]
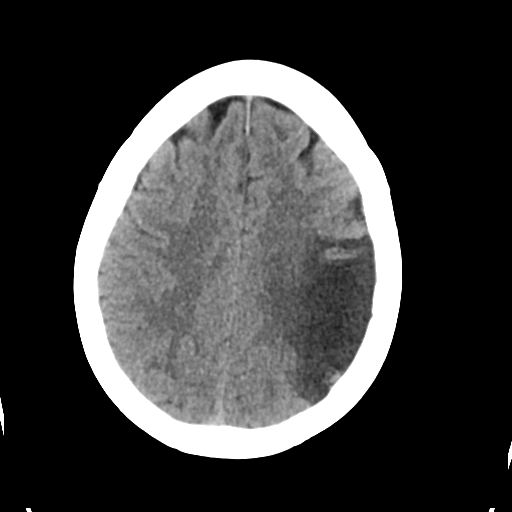
[im 22/36  bone]
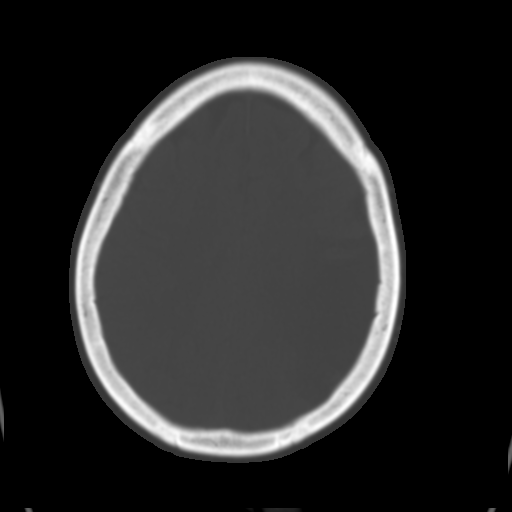
[im 27/36  brain]
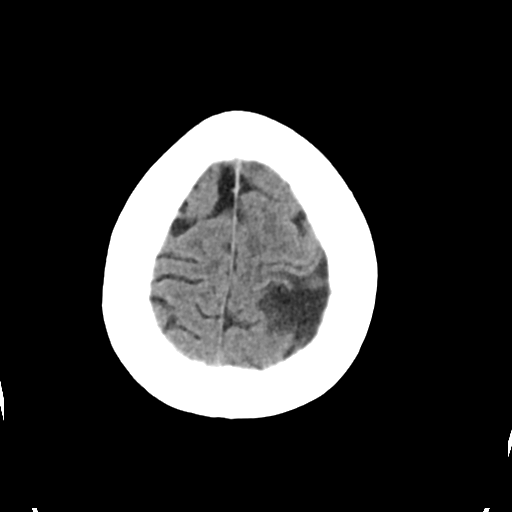
[im 31/36  brain]
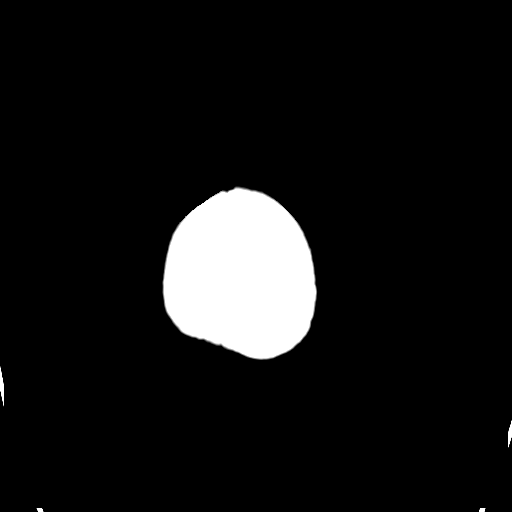

[Series 4: head bone · axial · 0.41mm/px · z∈[-140,-104]mm · 3 of 88 slices shown]
[im 9/88  bone]
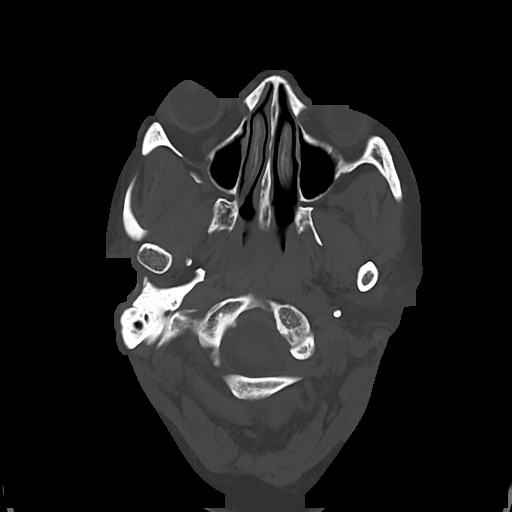
[im 18/88  bone]
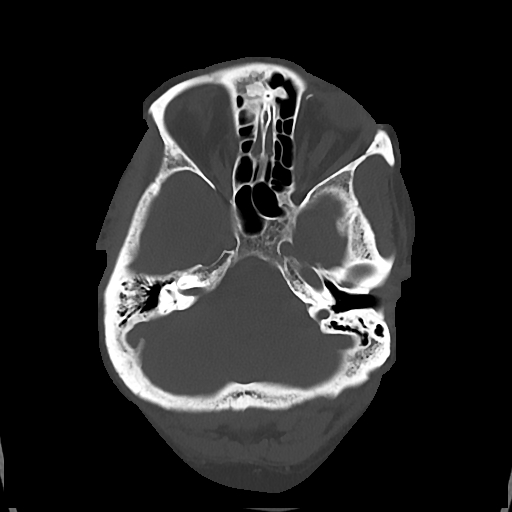
[im 27/88  bone]
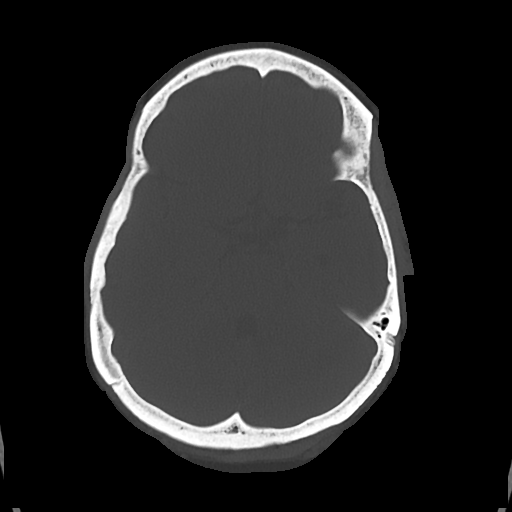

[Series 5: cor soft · coronal · 0.34mm/px · 3 of 71 slices shown]
[im 24/71  brain]
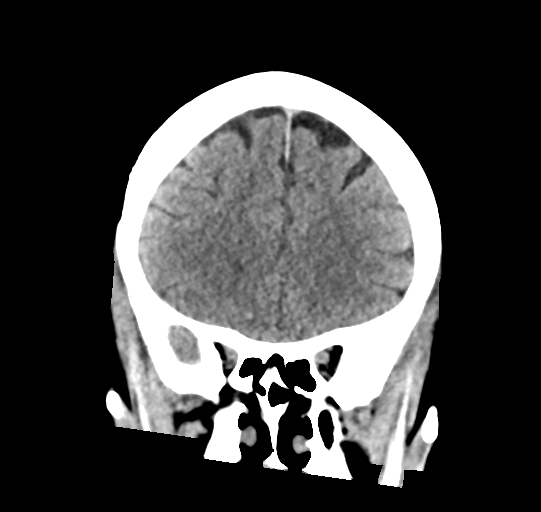
[im 32/71  brain]
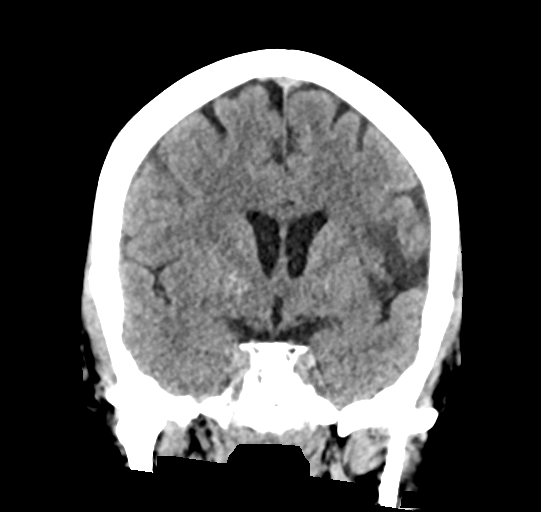
[im 39/71  brain]
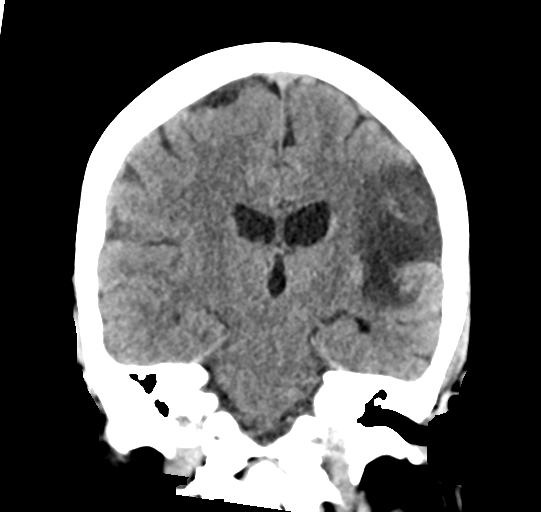

[Series 6: sag soft · sagittal · 0.37mm/px · 3 of 61 slices shown]
[im 22/61  brain]
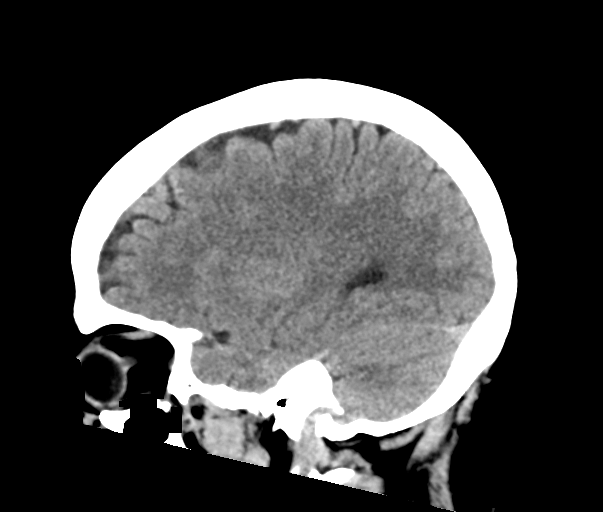
[im 31/61  brain]
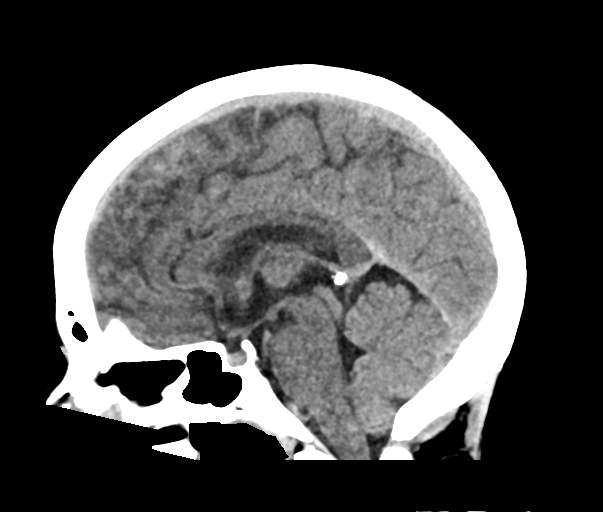
[im 40/61  brain]
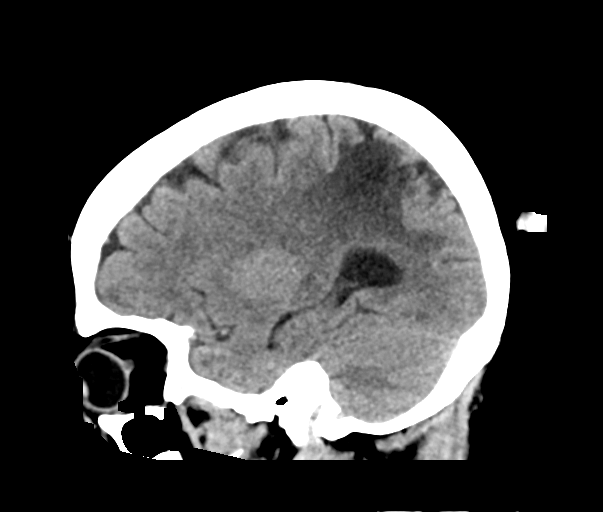

[16 of 47 positions shown; findings below may reference images not displayed]

FINDINGS: Brain: Large area of encephalomalacia in the left frontoparietal
region consistent with old infarct in the distribution of the left
middle cerebral artery. Appearances are similar to previous study
but there is evidence of some loss of distinction of the anterior
margins. This may suggest acute progression of the old infarct.
There is no acute intracranial hemorrhage. No mass effect or midline
shift. No abnormal extra-axial fluid collections. No ventricular
dilatation. Basal cisterns are not effaced.

Vascular: No hyperdense vessel or unexpected calcification.

Skull: Normal. Negative for fracture or focal lesion.

Sinuses/Orbits: No acute finding.

Other: None.
IMPRESSION: Old infarct in the distribution of the left middle cerebral artery
with possible acute progression around the anterior margins. No
acute intracranial hemorrhage and no significant mass effect.

## 2018-12-06 IMAGING — CT CT CERVICAL SPINE W/O CM
5 of 7 series · 14 of 33 positions shown, 15 images · IV contrast (agent unspecified)
Comparison: Head CT 03/26/2017. CTA head and neck 05/26/2015.
Cervical spine CT 10/13/2014.

CLINICAL DATA: Multiple falls in the past 3 days with facial
injury. Right facial swelling. Posterior neck pain. Generalized
weakness. Prior stroke. Initial encounter.

EXAM:
CT MAXILLOFACIAL WITH CONTRAST
CT CERVICAL SPINE WITHOUT CONTRAST
TECHNIQUE: Multidetector CT imaging of the maxillofacial structures was
performed with intravenous contrast. Multiplanar CT image
reconstructions were also generated. A small metallic BB was placed
on the right temple in order to reliably differentiate right from
left.
Multidetector CT imaging of the cervical spine was performed without
intravenous contrast. Multiplanar CT image reconstructions were also
generated.
CONTRAST:  75 mL [id number]

[Series 4: c_spine 2.0 st · axial · 0.29mm/px · z∈[-242,-150]mm · 3 of 93 slices shown, 4 images]
[im 24/93  soft-tissue]
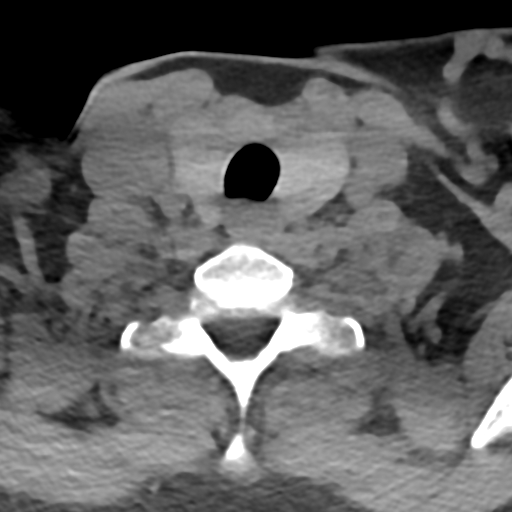
[im 24/93  bone]
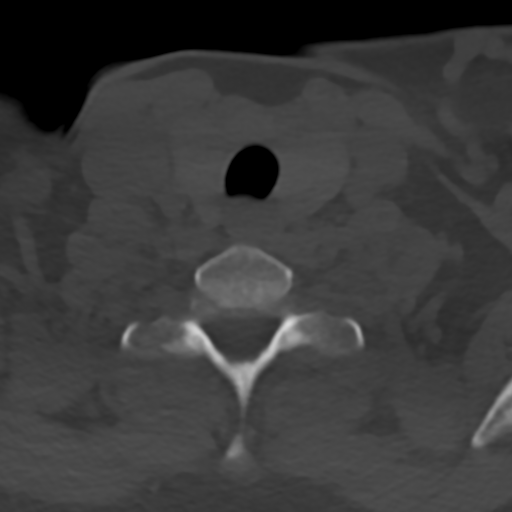
[im 47/93  bone]
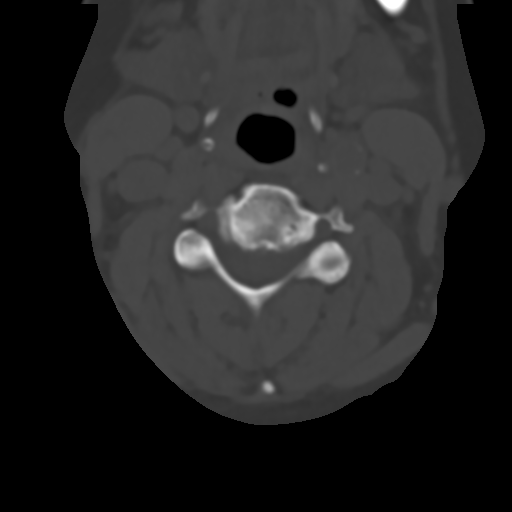
[im 70/93  bone]
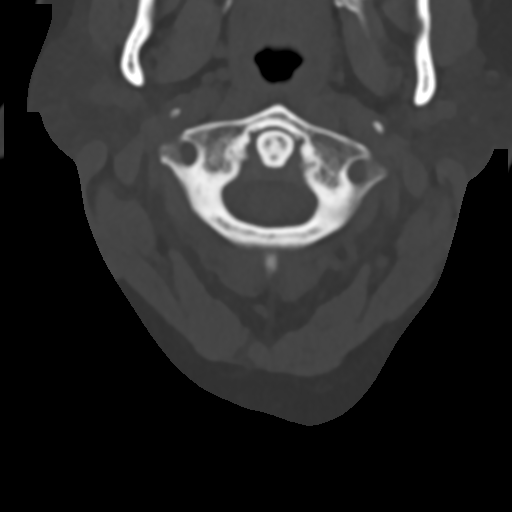

[Series 7: c_spine 2.0 cor bone · coronal · 0.23mm/px · 2 of 61 slices shown]
[im 3/61  bone]
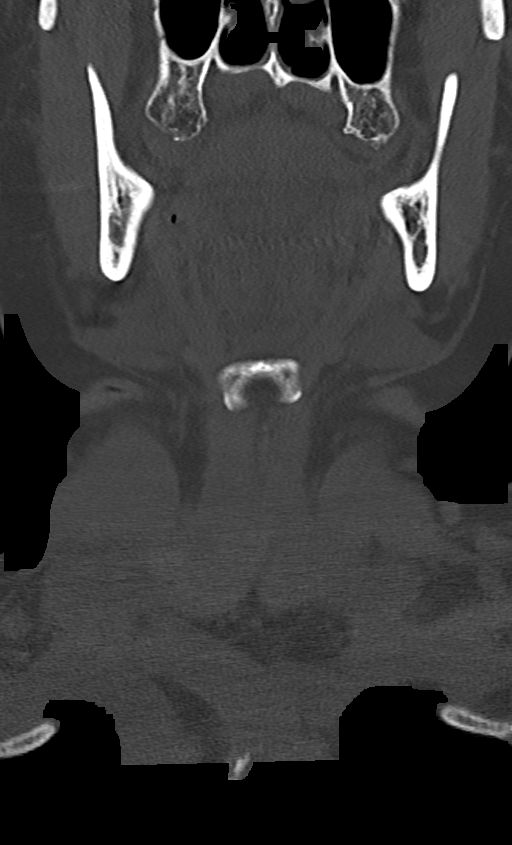
[im 32/61  bone]
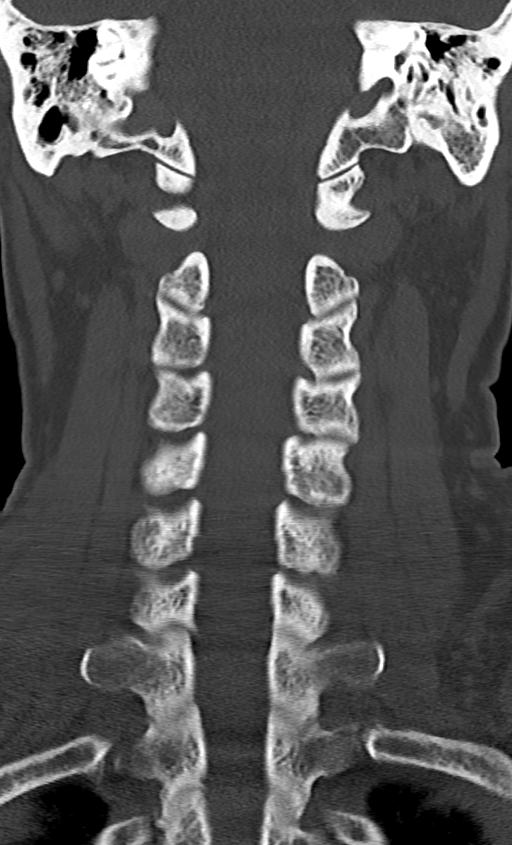

[Series 9: c_spine 2.0 orthogonals · axial · 0.21mm/px · z∈[-259,-176]mm · 3 of 92 slices shown]
[im 23/92  bone]
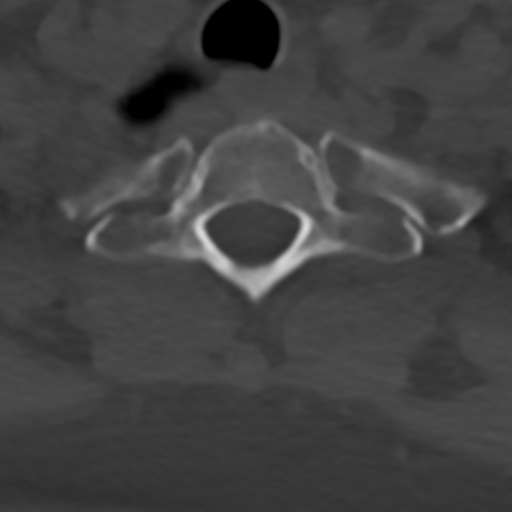
[im 46/92  bone]
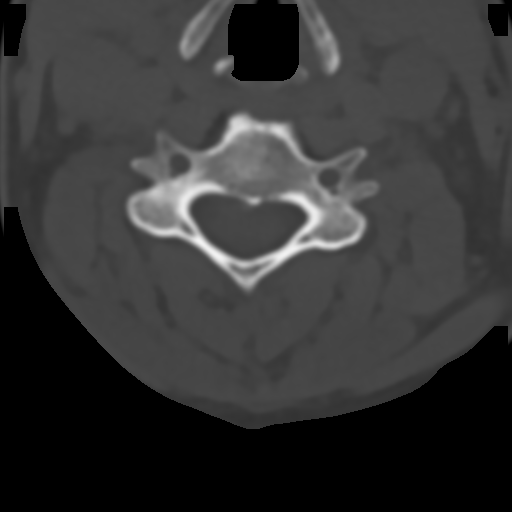
[im 69/92  bone]
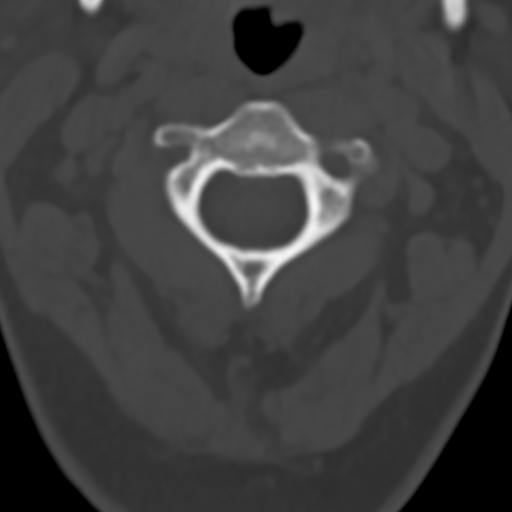

[Series 12: facialbone 2.0 st · axial · 0.35mm/px · 1 of 79 slices shown]
[im 27/79  bone]
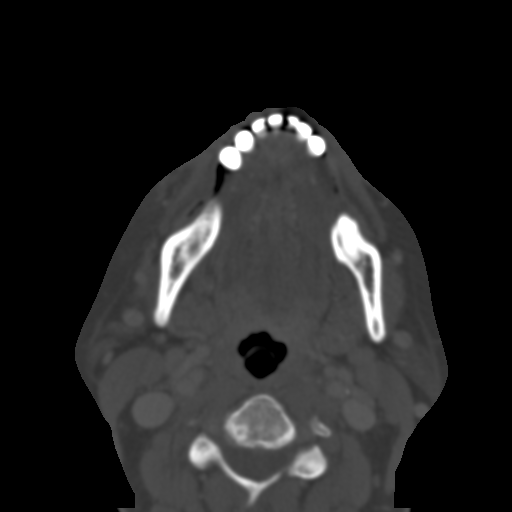

[Series 17: facialbone 2.0 sag st · sagittal · 0.31mm/px · 5 of 76 slices shown]
[im 11/76  bone]
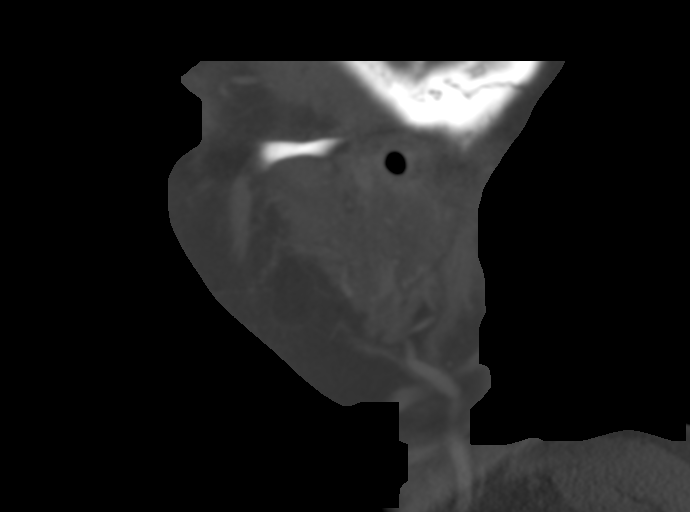
[im 22/76  bone]
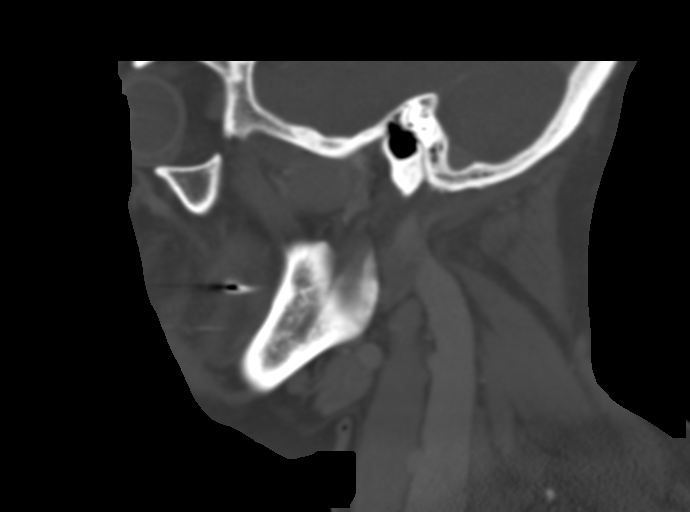
[im 33/76  bone]
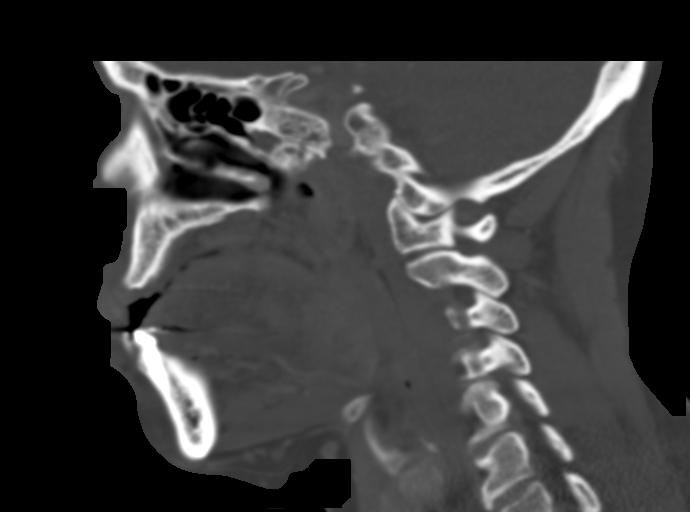
[im 43/76  bone]
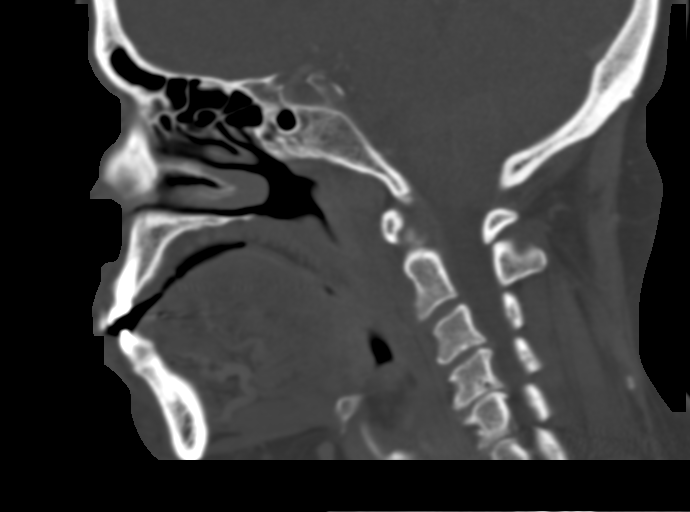
[im 54/76  bone]
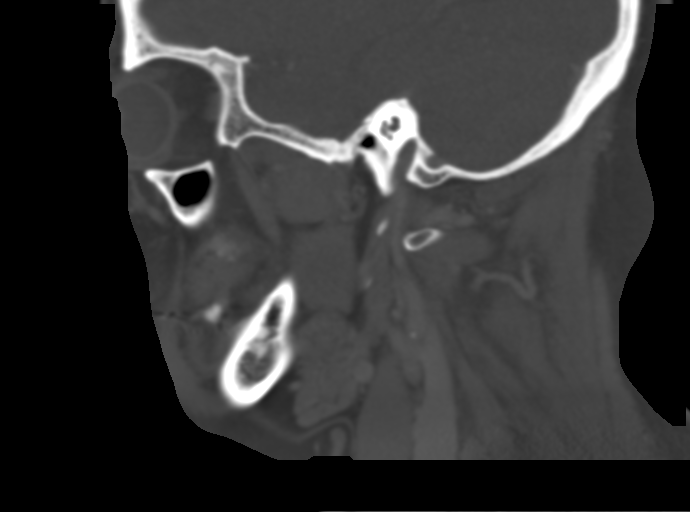

[14 of 33 positions shown; findings below may reference images not displayed]

FINDINGS: CT MAXILLOFACIAL FINDINGS

Osseous: No acute fracture or mandibular dislocation. Multiple
dental caries and missing teeth. Minimal periapical lucency
associated with the right maxillary central incisor without evidence
of subperiosteal abscess.

Orbits: The globes appear intact. No acute traumatic or inflammatory
orbital findings are identified.

Sinuses: Paranasal sinuses and mastoid air cells are clear.

Soft tissues: Mild right lateral facial soft tissue swelling. No
focal hematoma.

Limited intracranial: Partially visualized chronic left MCA infarct
involving the insula.

CT CERVICAL FINDINGS

Alignment: No subluxation.

Skull base and vertebrae: No acute fracture or destructive osseous
lesion.

Soft tissues and spinal canal: No evidence of prevertebral swelling.

Disc levels: Similar appearance of disc degeneration compared to the
prior cervical spine CT. Disc bulging and uncovertebral spurring
result in borderline to mild spinal stenosis at C3-4 and likely
moderate spinal stenosis and mild-to-moderate left neural foraminal
stenosis at C4-5. A right paracentral disc protrusion at C5-6
results in mild spinal stenosis.

Upper chest: Mild centrilobular emphysema.

Other: Mild left cervical carotid artery calcified atherosclerosis.
IMPRESSION: 1. Mild right facial soft tissue swelling. No evidence of acute
maxillofacial fracture or abscess.
2. Similar appearance of cervical spine disc degeneration without
evidence of acute osseous abnormality.

## 2018-12-06 IMAGING — MR MR HEAD W/O CM
9 of 11 series · 33 of 48 positions shown · non-contrast
Comparison: Head CT 03/26/2017.  MRI 05/27/2015.

CLINICAL DATA: Worsening numbness of the right side of the face,
arm and leg.

EXAM:
MRI HEAD WITHOUT CONTRAST
TECHNIQUE: Multiplanar, multiecho pulse sequences of the brain and surrounding
structures were obtained without intravenous contrast.

[Series 3: DWI · axial · 3.0mm · 0.94mm/px · z∈[-93,+44]mm · 7 of 93 slices shown (1 of 2)]
[im 1/93]
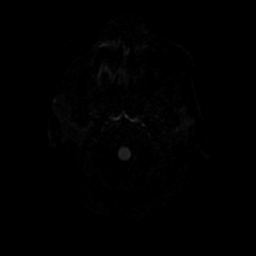
[im 16/93]
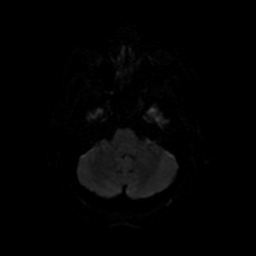
[im 31/93]
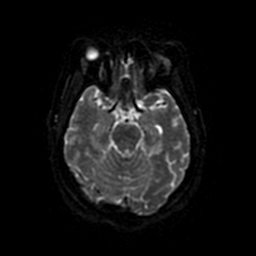
[im 47/93]
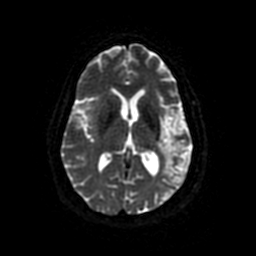
[im 62/93]
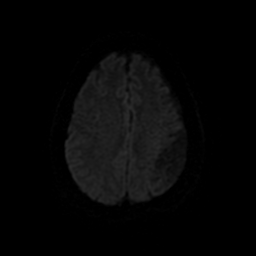
[im 77/93]
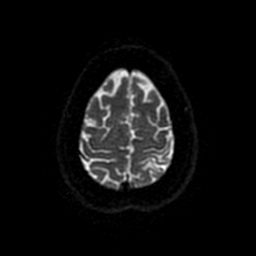
[im 93/93]
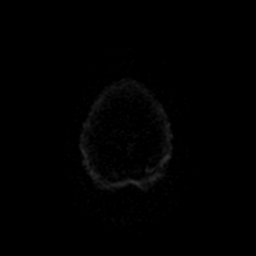

[Series 4: DWI · coronal · 4.0mm · 0.94mm/px · 6 of 70 slices shown (2 of 2)]
[im 1/70]
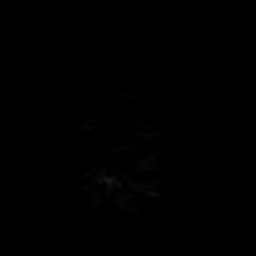
[im 14/70]
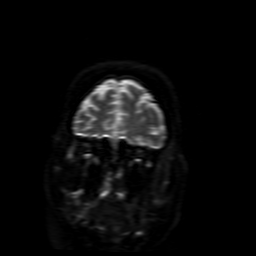
[im 28/70]
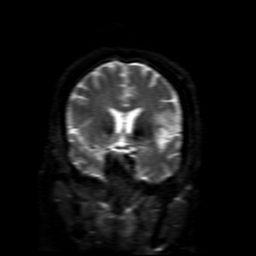
[im 42/70]
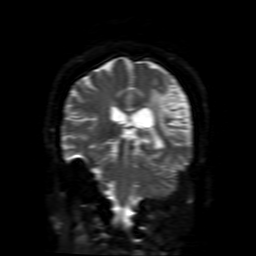
[im 56/70]
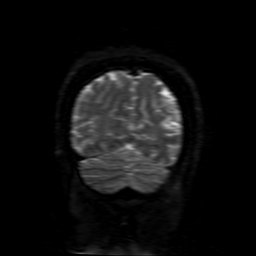
[im 70/70]
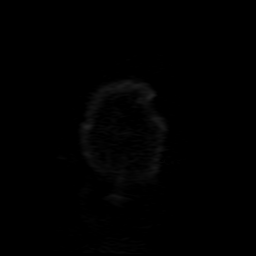

[Series 5: FLAIR · sagittal · 5.0mm · 0.47mm/px · 2 of 23 slices shown (1 of 2)]
[im 1/23]
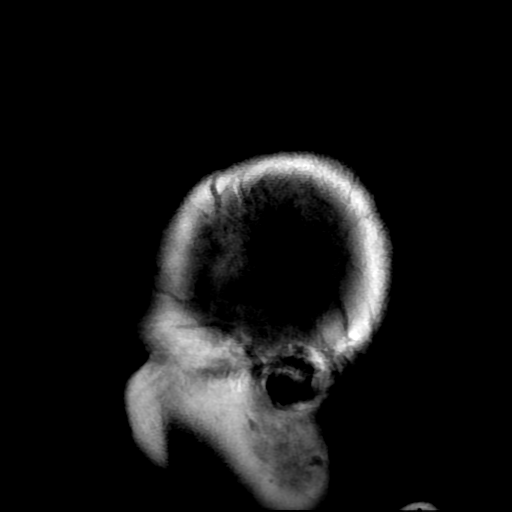
[im 23/23]
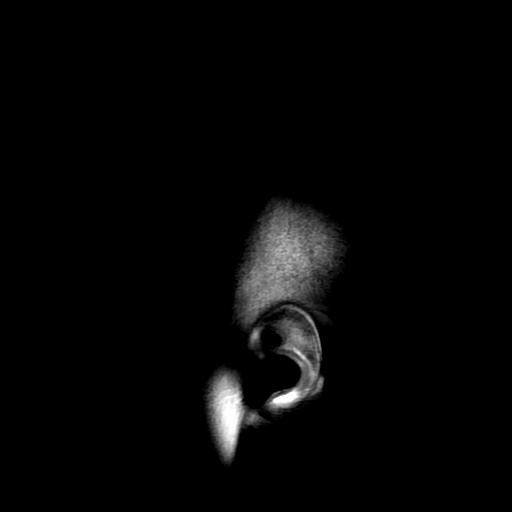

[Series 6: T2 · axial · 5.0mm · 0.47mm/px · z∈[-102,+53]mm · 2 of 27 slices shown (1 of 2)]
[im 1/27]
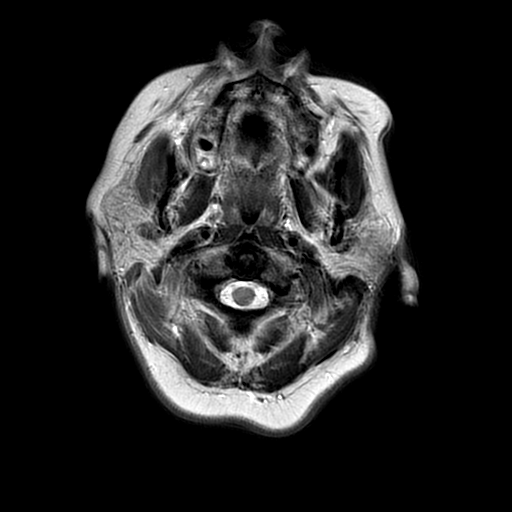
[im 27/27]
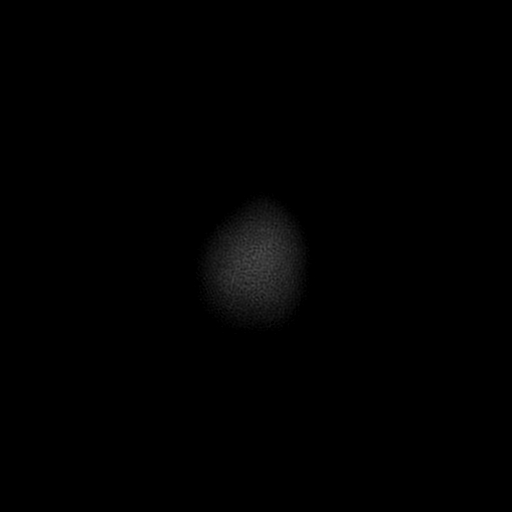

[Series 7: FLAIR · axial · 5.0mm · 0.47mm/px · z∈[-102,+53]mm · 2 of 27 slices shown (2 of 2)]
[im 1/27]
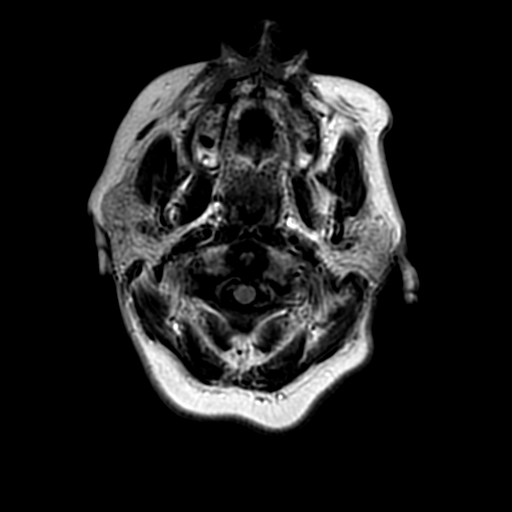
[im 27/27]
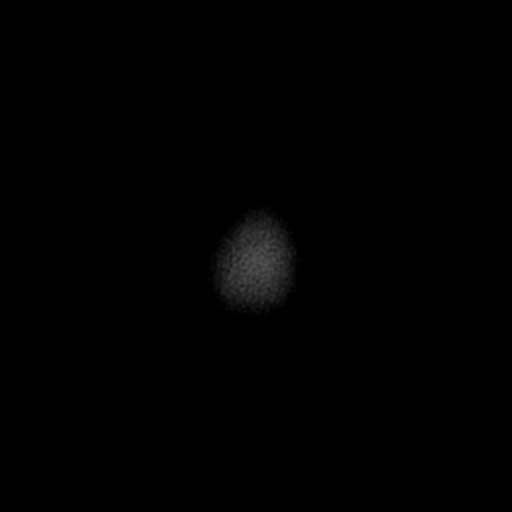

[Series 8: (person_name) · axial · 3.0mm · 0.47mm/px · z∈[-100,-17]mm · 5 of 100 slices shown]
[im 1/100]
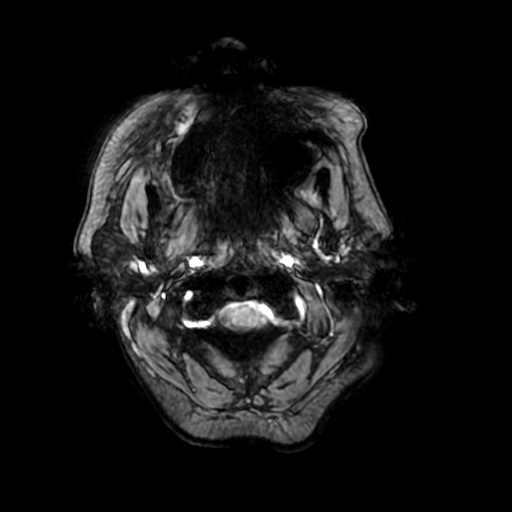
[im 15/100]
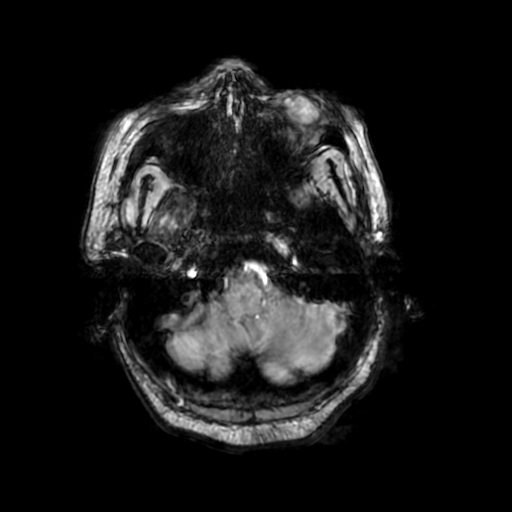
[im 29/100]
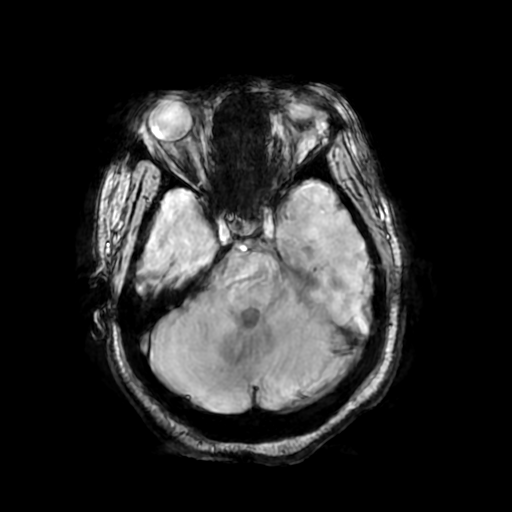
[im 43/100]
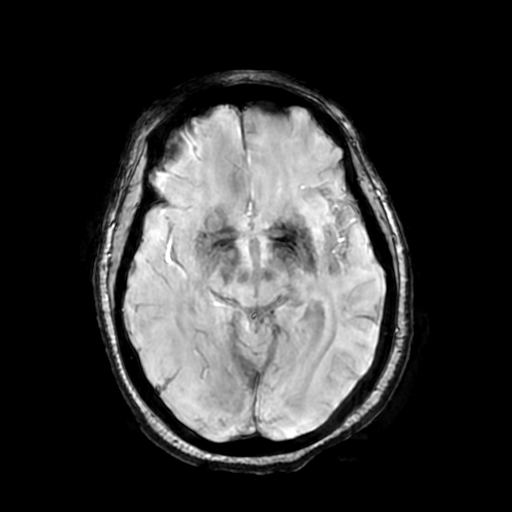
[im 57/100]
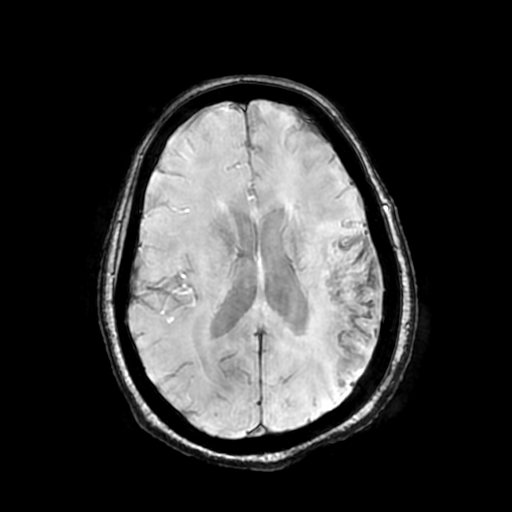

[Series 10: T2 · coronal · 5.0mm · 0.47mm/px · 2 of 27 slices shown (2 of 2)]
[im 1/27]
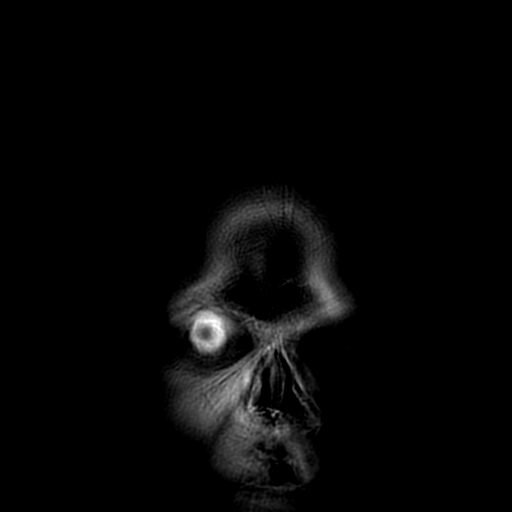
[im 27/27]
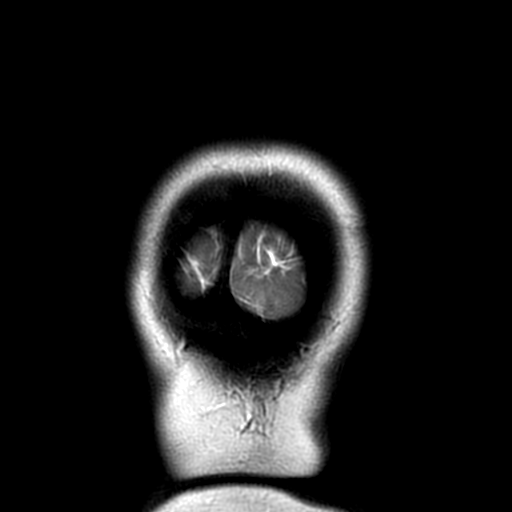

[Series 350: ADC · axial · 3.0mm · 0.94mm/px · z∈[-93,+44]mm · 4 of 47 slices shown (1 of 2)]
[im 1/47]
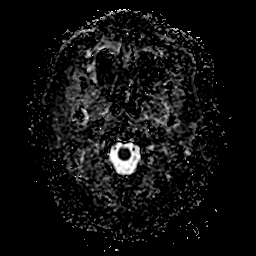
[im 16/47]
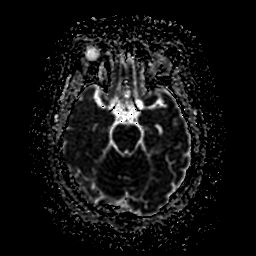
[im 31/47]
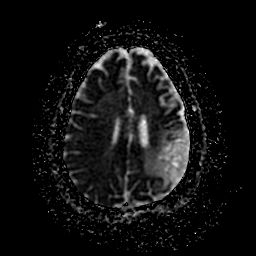
[im 47/47]
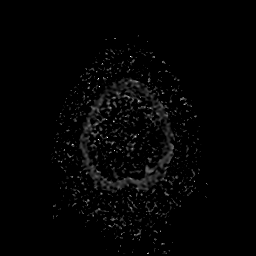

[Series 450: ADC · coronal · 4.0mm · 0.94mm/px · 3 of 35 slices shown (2 of 2)]
[im 1/35]
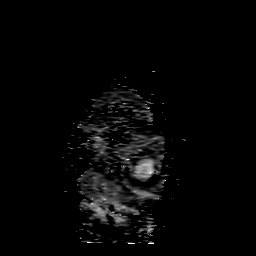
[im 18/35]
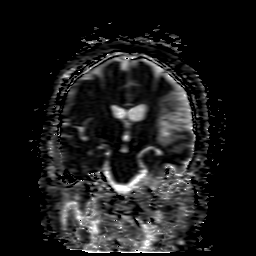
[im 35/35]
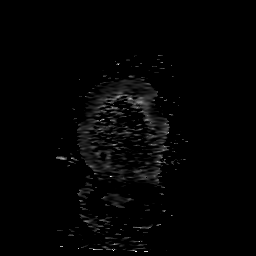

[33 of 48 positions shown; findings below may reference images not displayed]

FINDINGS: Brain: Diffusion imaging does not show any acute or subacute
infarction. The brainstem is normal except for wall air in
degeneration on the left. No cerebellar insult. Right cerebral
hemisphere is normal. Left cerebral hemisphere shows old infarction
in the posterior left MCA territory which has progressed to atrophy,
encephalomalacia and gliosis. No evidence of mass lesion,
hemorrhage, hydrocephalus or extra-axial collection. Some residual
hemosiderin deposition in the region of old infarction.

Vascular: Major vessels at the base of the brain show flow.

Skull and upper cervical spine: Normal

Sinuses/Orbits: Clear/normal

Other: None
IMPRESSION: No acute finding to explain the clinical change. Old left MCA
territory infarction as previously seen, which has progressed to
atrophy, encephalomalacia and gliosis.

## 2019-07-22 IMAGING — CR DG CHEST 2V
2 series · 2 of 2 positions shown · non-contrast
Comparison: 09/09/2016 chest radiograph.

CLINICAL DATA: Dyspnea

EXAM:
CHEST - 2 VIEW

[w chest pa]
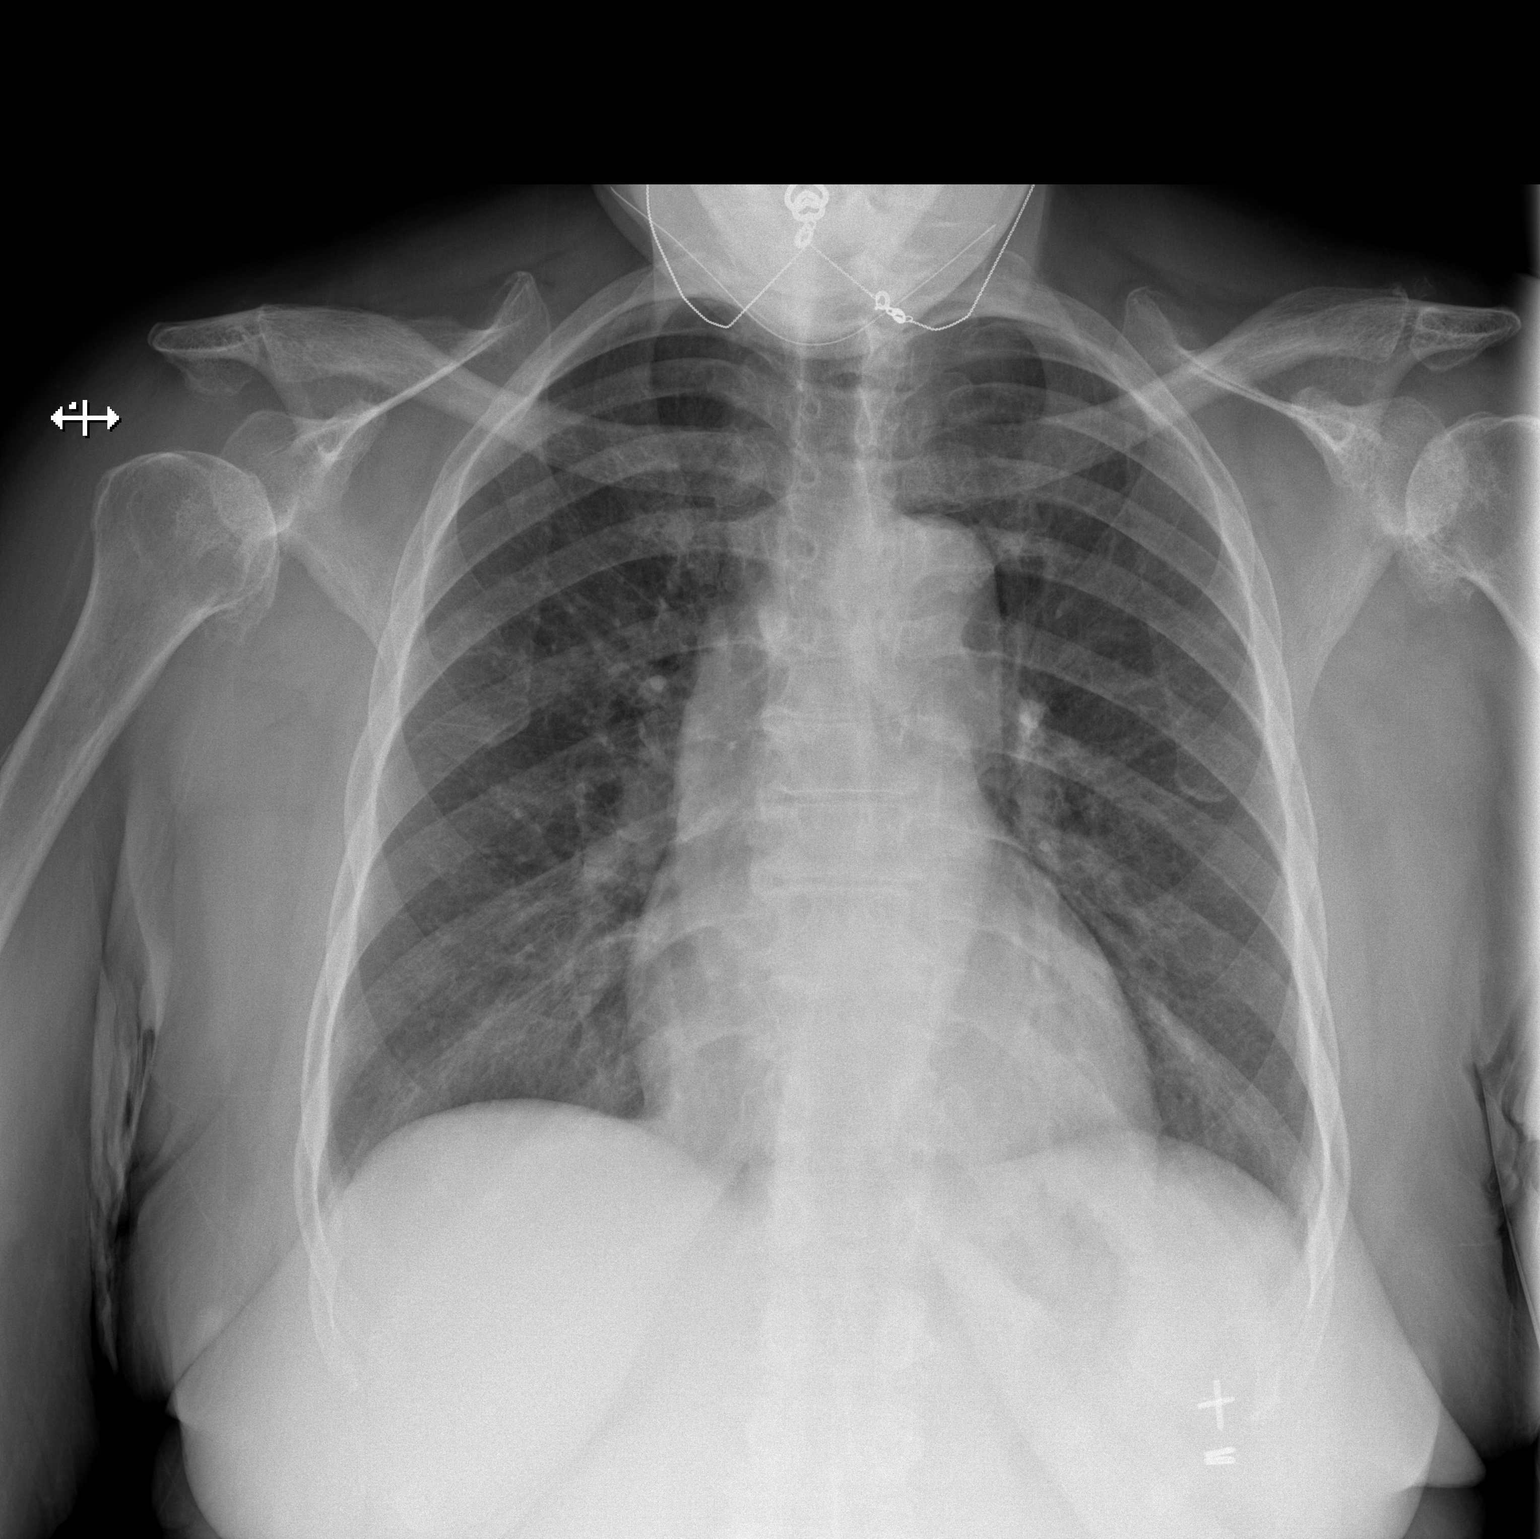

[w chest lat]
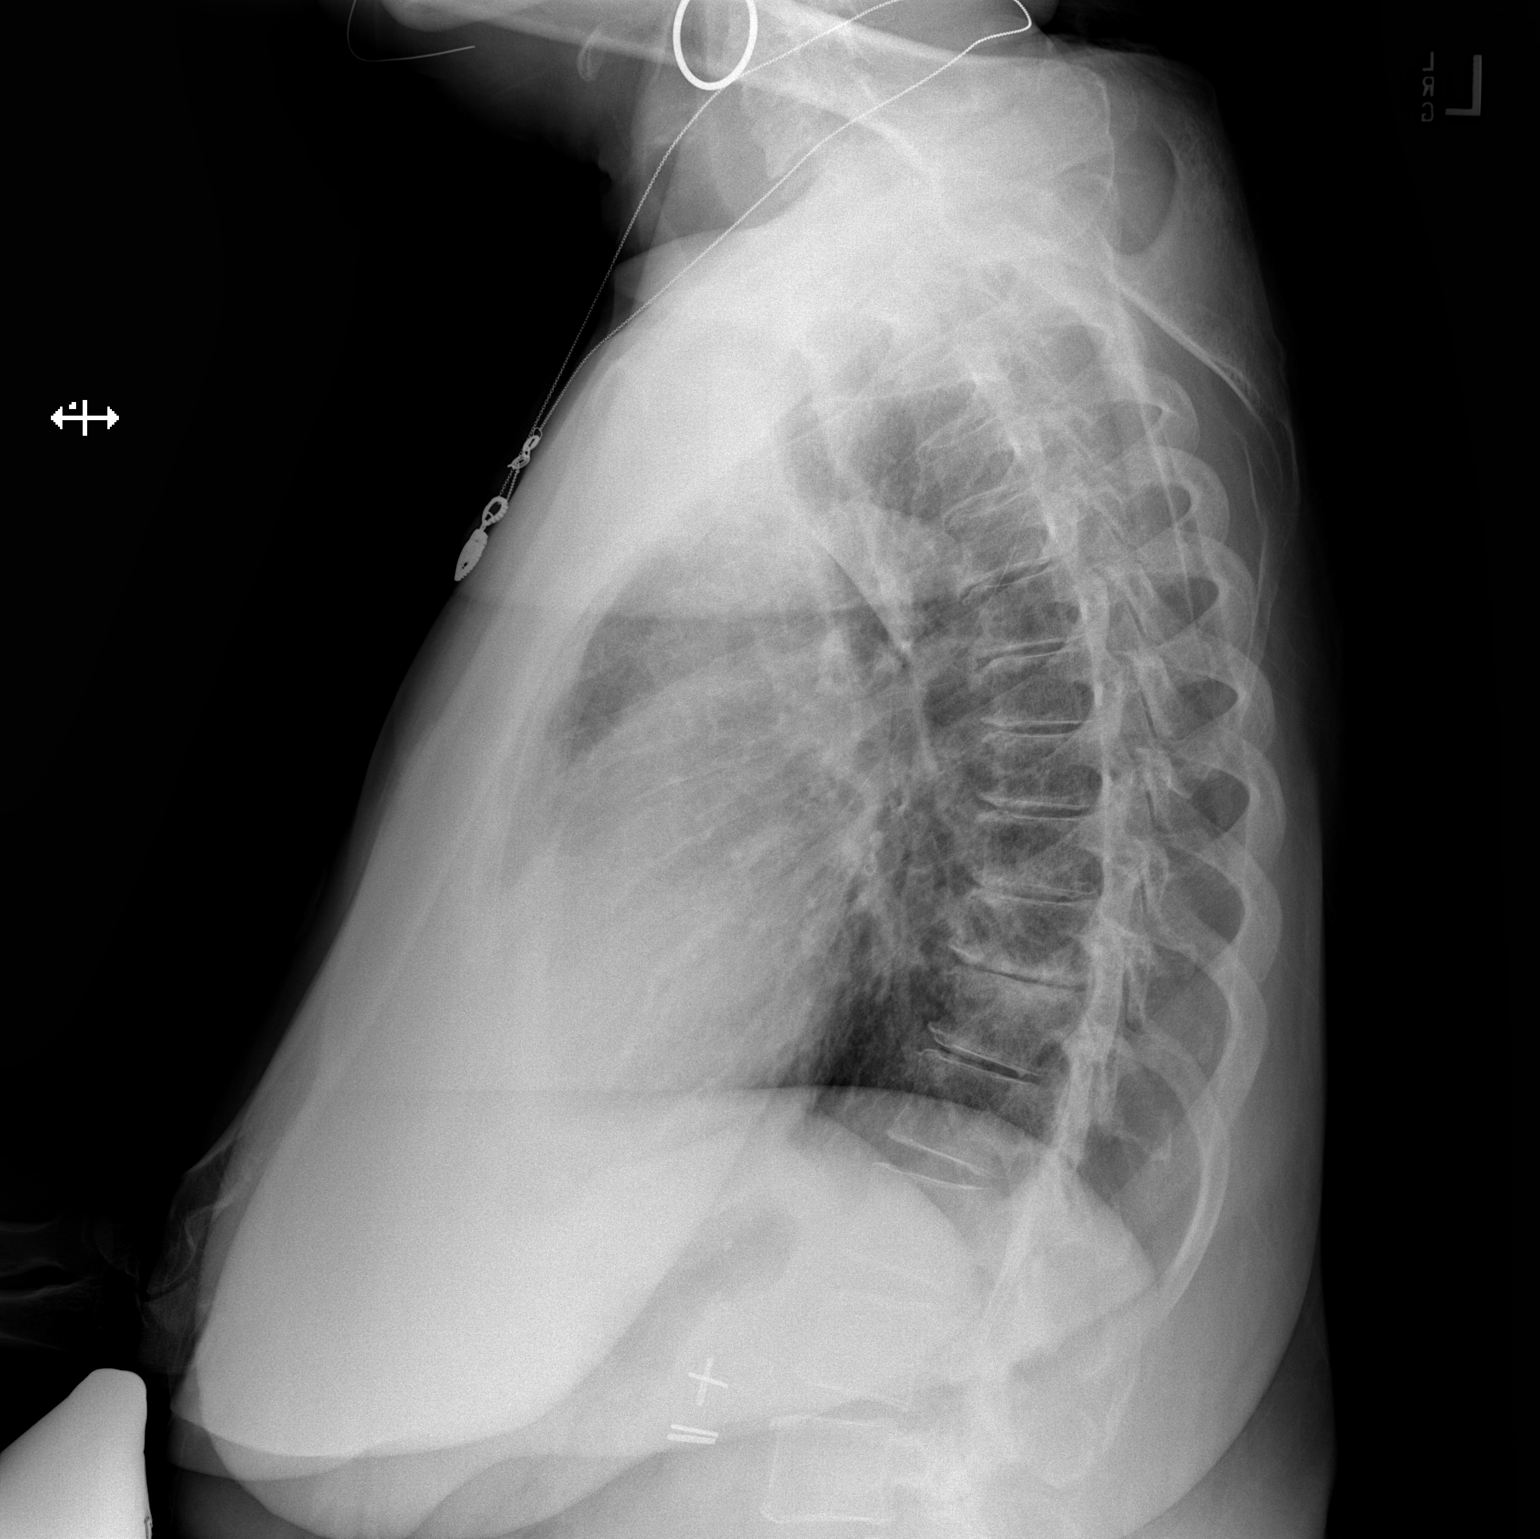

[2 of 2 positions shown; findings below may reference images not displayed]

FINDINGS: Stable cardiomediastinal silhouette with normal heart size. No
pneumothorax. No pleural effusion. Lungs appear clear, with no acute
consolidative airspace disease and no pulmonary edema. Surgical
clips overlie the left upper abdomen.
IMPRESSION: No active cardiopulmonary disease.

## 2019-09-15 IMAGING — DX DG CHEST 2V
2 series · 2 of 2 positions shown · non-contrast
Comparison: 11/10/2017

CLINICAL DATA: Facial swelling and lower extremity edema

EXAM:
CHEST - 2 VIEW

[chest lat]
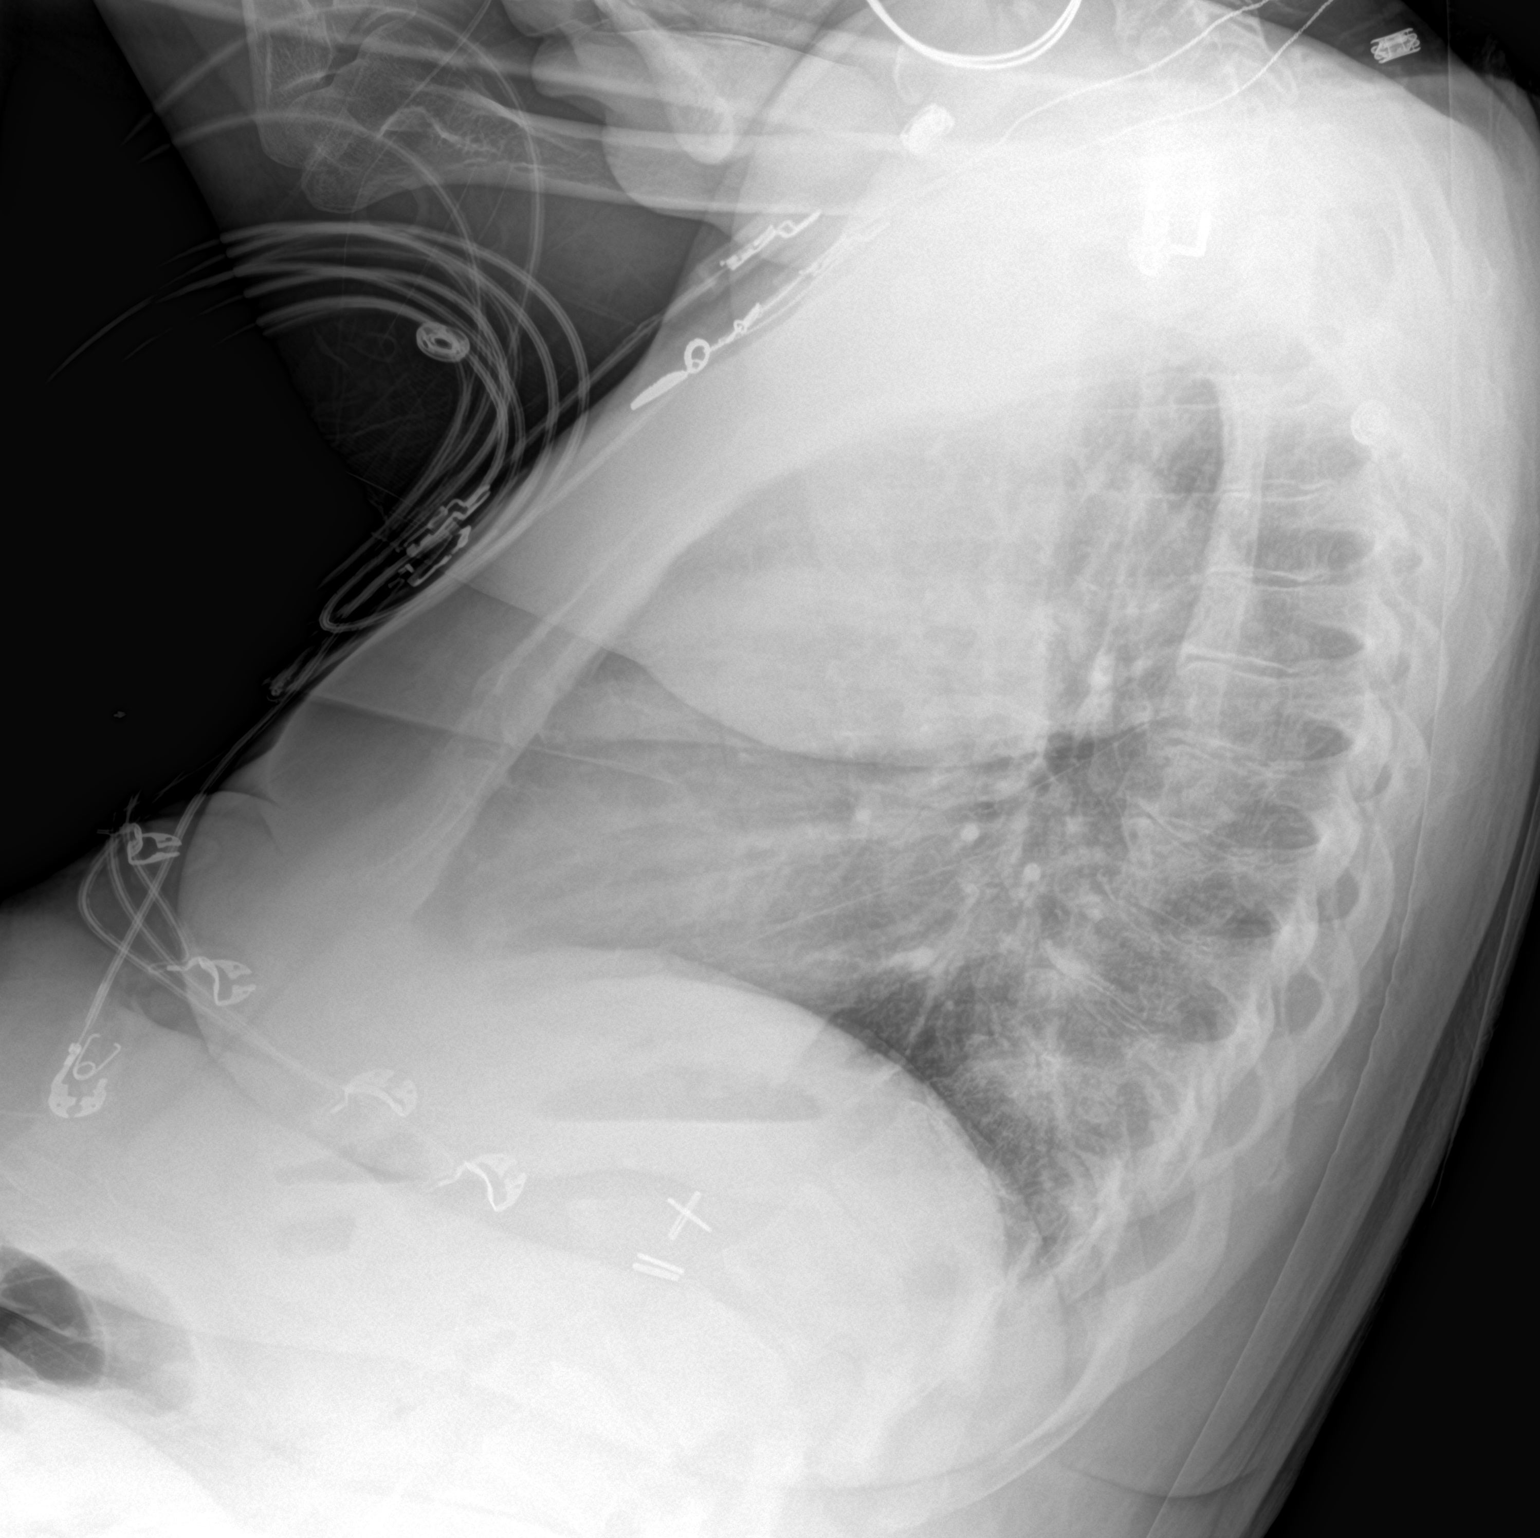

[chest ap]
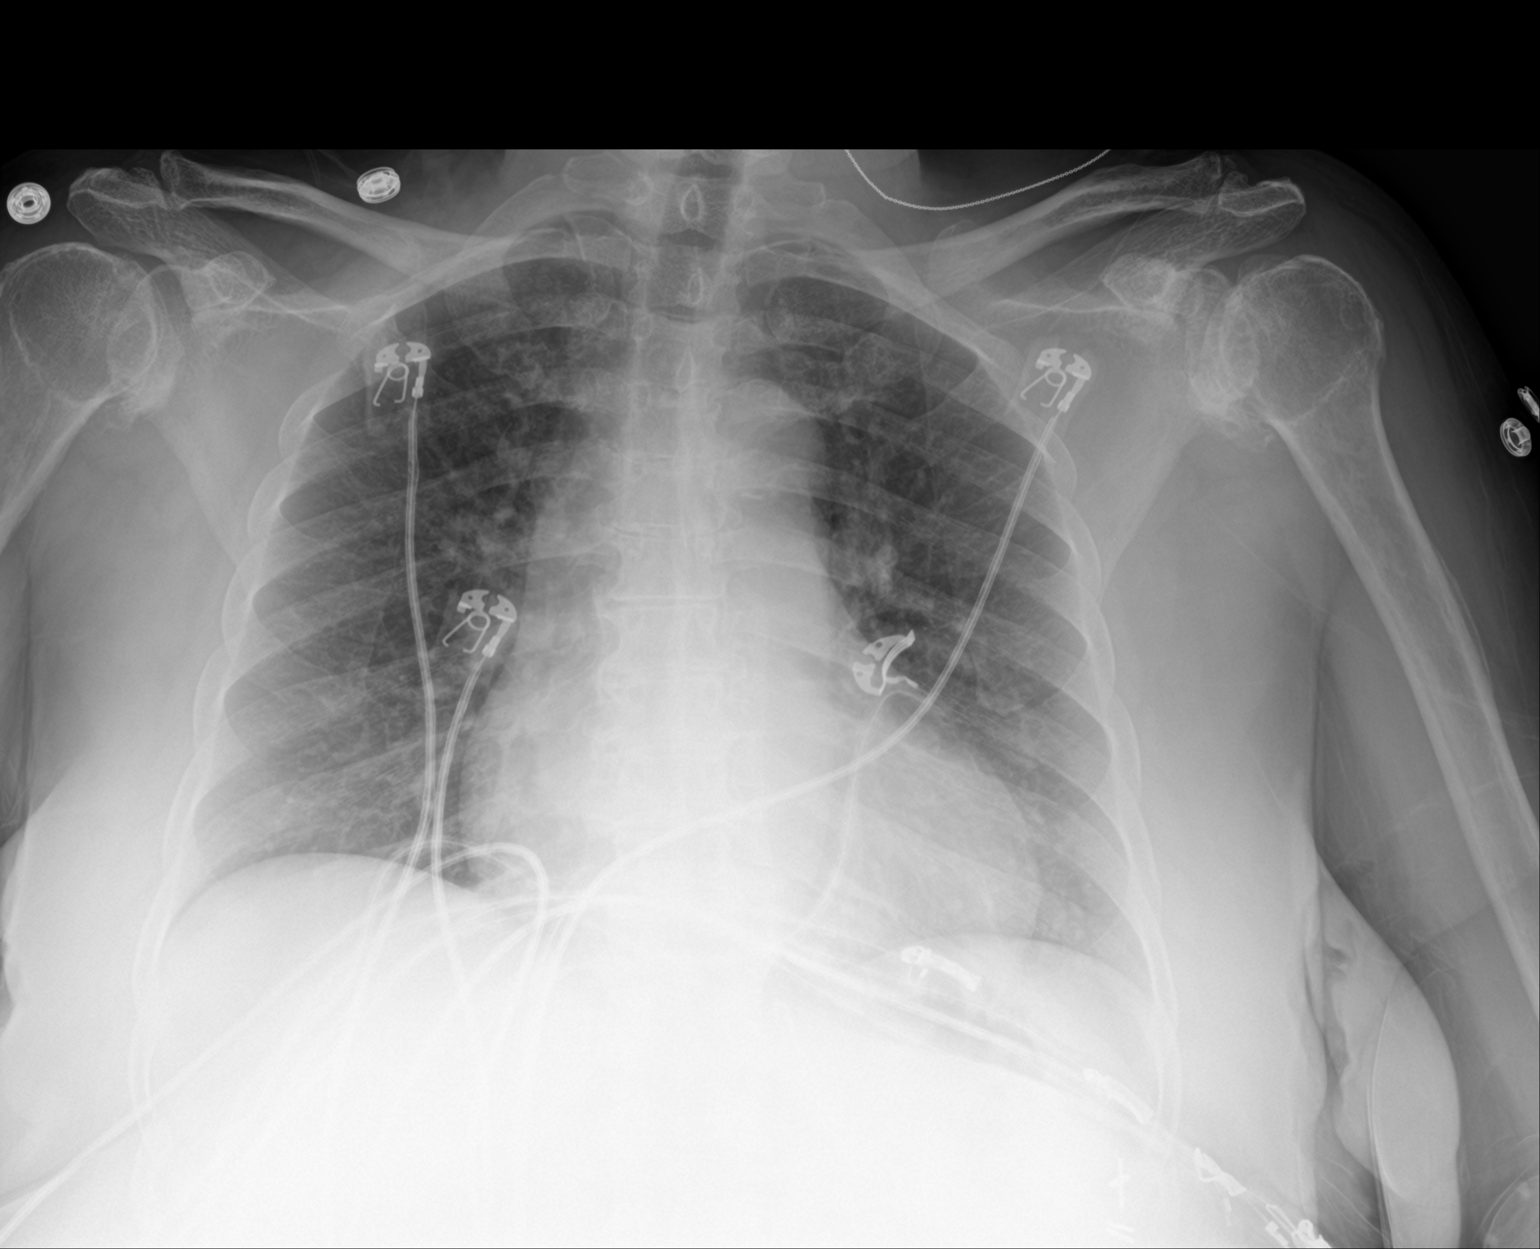

[2 of 2 positions shown; findings below may reference images not displayed]

FINDINGS: Cardiac shadow is mildly prominent. The lungs are well aerated
bilaterally. No focal infiltrate or sizable effusion is noted.
Degenerative changes of the thoracic spine are seen.
IMPRESSION: No active cardiopulmonary disease.

## 2019-10-02 IMAGING — MG DIGITAL SCREENING BILATERAL MAMMOGRAM WITH TOMO AND CAD
8 series · 9 of 24 positions shown · non-contrast
Comparison: Previous exam(s).

CLINICAL DATA: Screening.

EXAM:
DIGITAL SCREENING BILATERAL MAMMOGRAM WITH TOMO AND CAD

[L CC synth-2D]
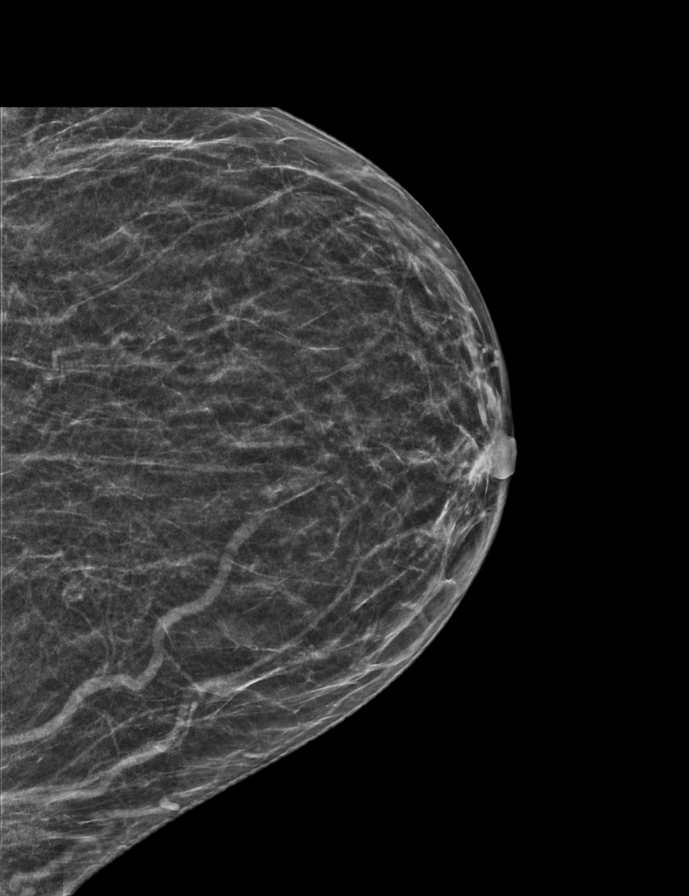

[R CC synth-2D]
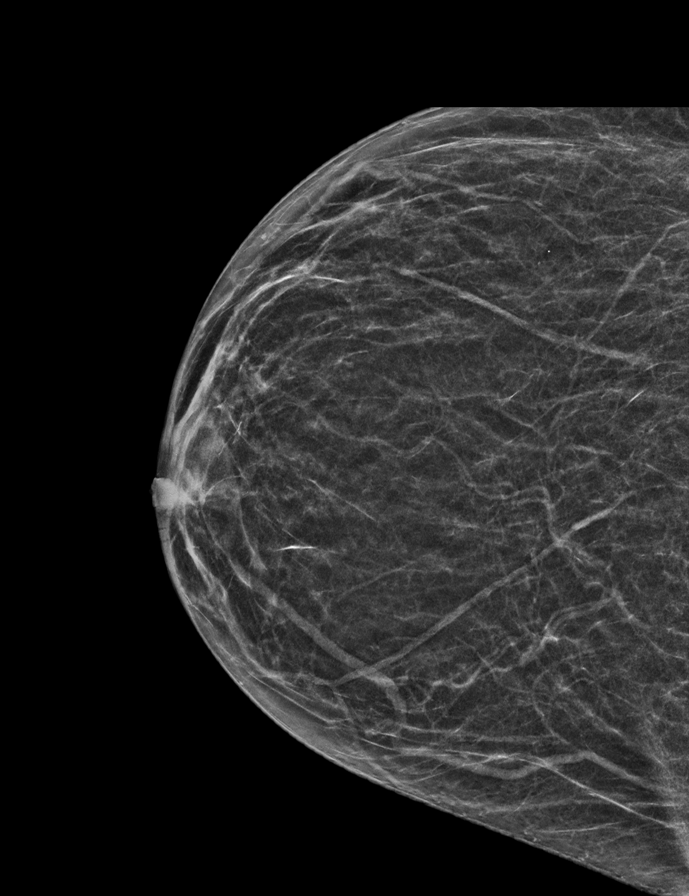

[L MLO synth-2D]
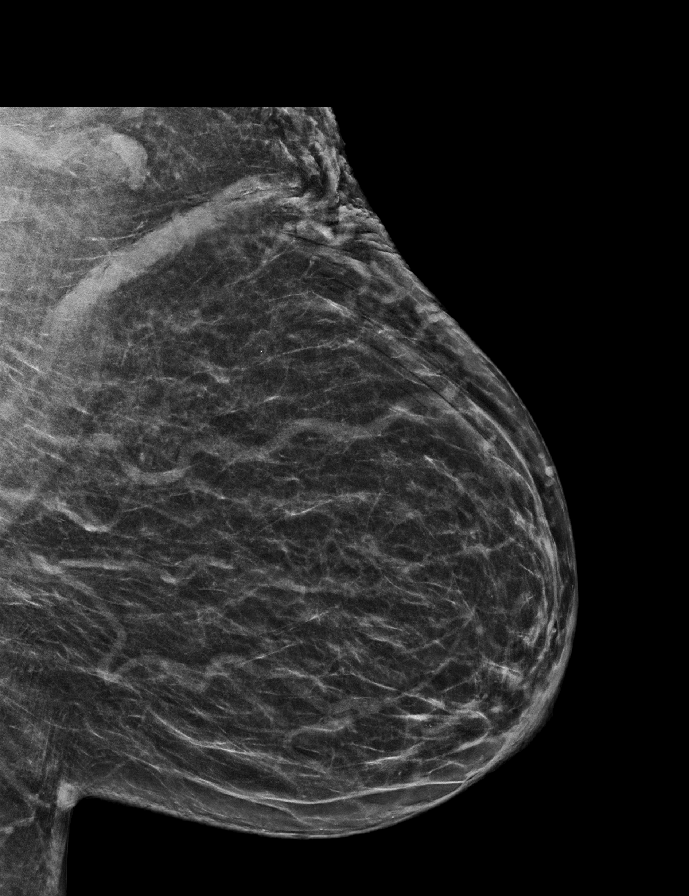

[R MLO synth-2D]
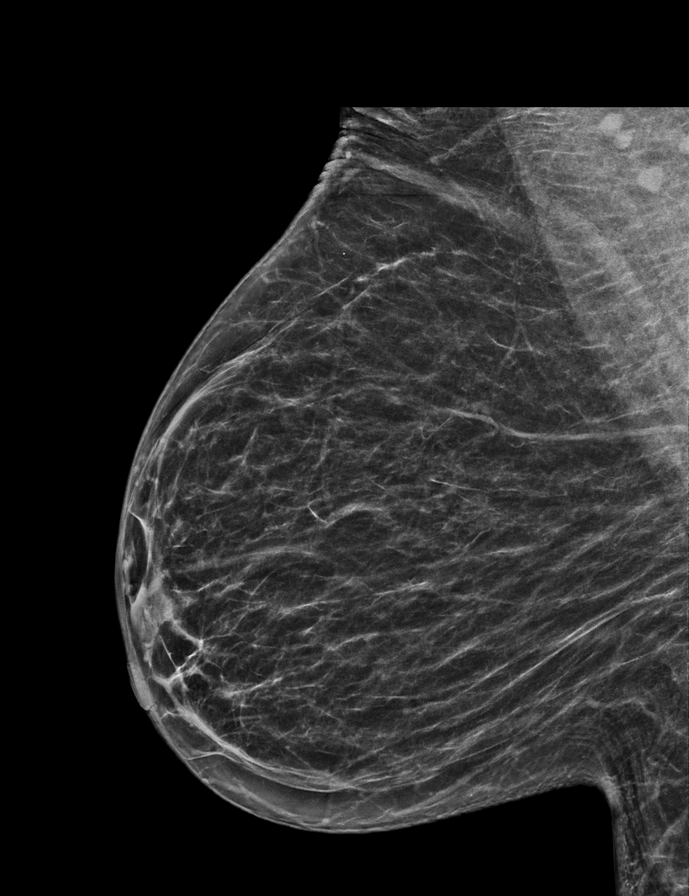

[R CC tomo · 2 of 46 frames shown]
[frame 15/46]
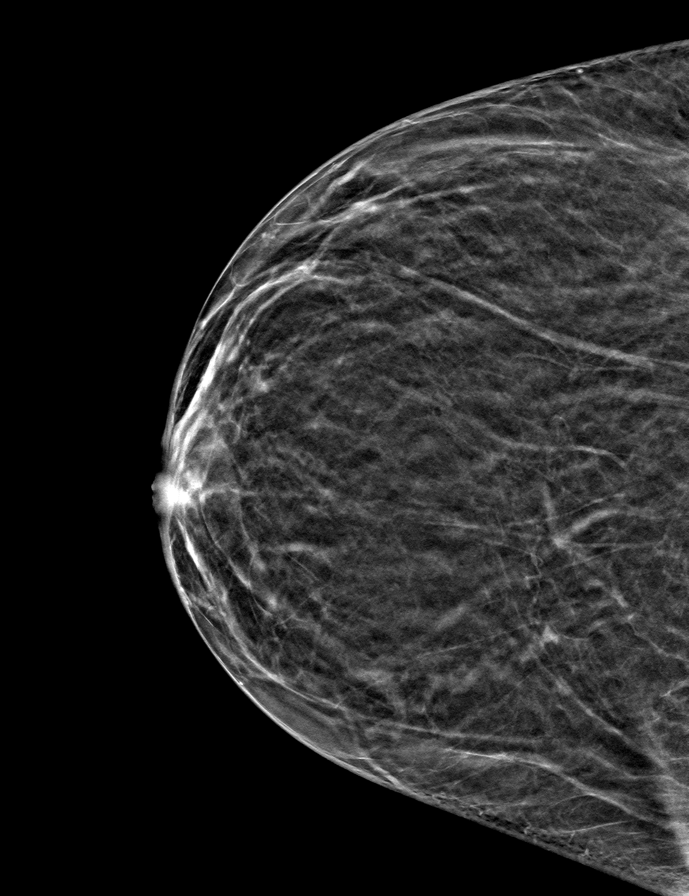
[frame 23/46]
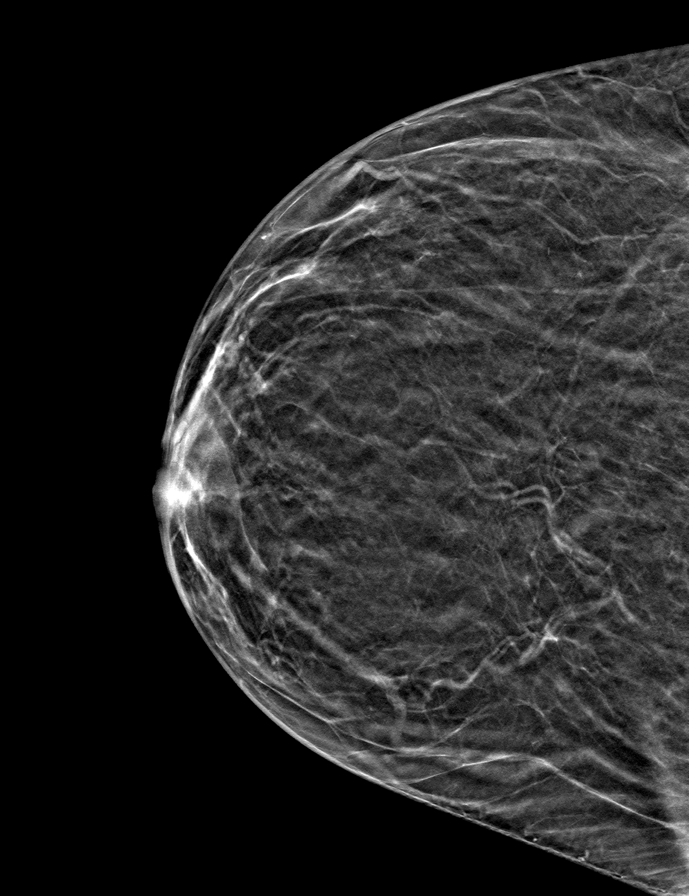

[R MLO tomo · tomo slice 29/57.0]
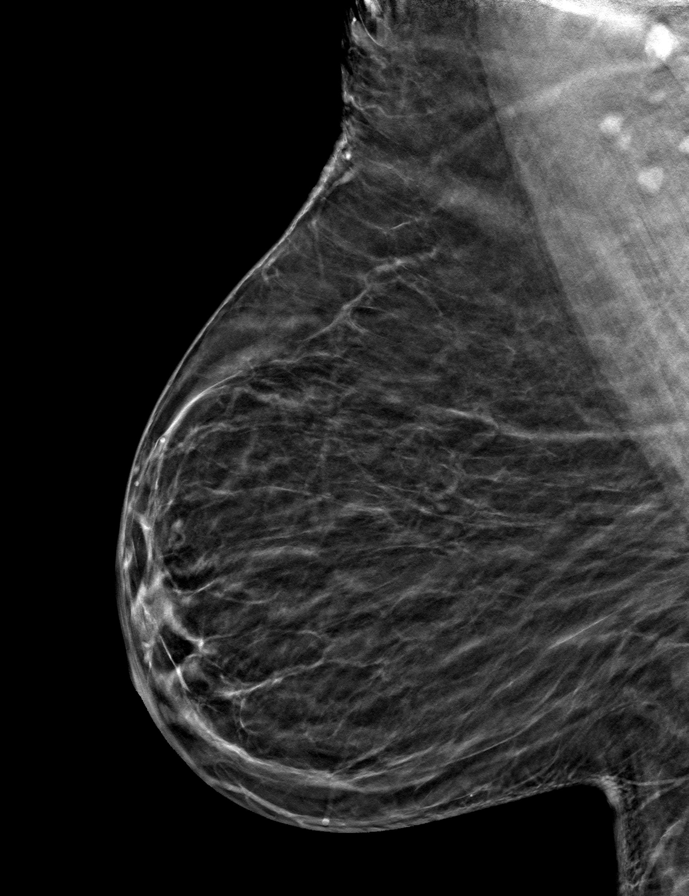

[L MLO tomo · tomo slice 31/60.0]
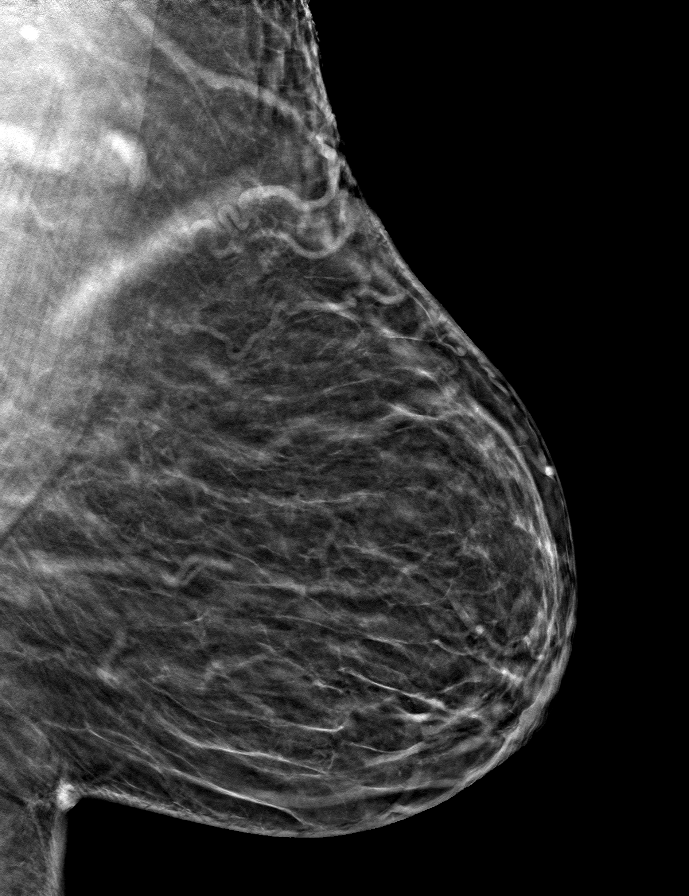

[L CC tomo · tomo slice 24/47.0]
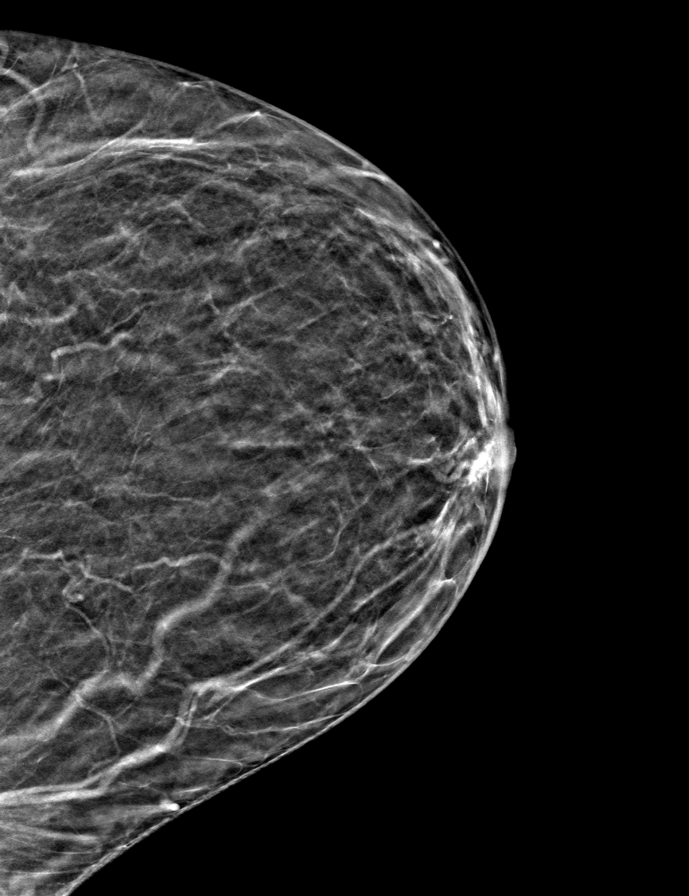

[9 of 24 positions shown; findings below may reference images not displayed]

ACR Breast Density Category b: There are scattered areas of
fibroglandular density.
FINDINGS: There are no findings suspicious for malignancy. Images were
processed with CAD.
IMPRESSION: No mammographic evidence of malignancy. A result letter of this
screening mammogram will be mailed directly to the patient.

RECOMMENDATION:
Screening mammogram in one year. (Code:[id number])

BI-RADS CATEGORY  1: Negative.

## 2021-01-23 ENCOUNTER — Other Ambulatory Visit: Payer: Self-pay | Admitting: Internal Medicine

## 2021-07-19 ENCOUNTER — Other Ambulatory Visit: Payer: Self-pay

## 2021-07-19 ENCOUNTER — Ambulatory Visit
Admission: RE | Admit: 2021-07-19 | Discharge: 2021-07-19 | Disposition: A | Discharge status: Home / Self Care | Payer: Medicare Other | Source: Ambulatory Visit | Attending: Internal Medicine | Admitting: Internal Medicine

## 2021-07-19 ENCOUNTER — Other Ambulatory Visit: Payer: Self-pay | Admitting: Internal Medicine

## 2021-07-19 DIAGNOSIS — Z1231 Encounter for screening mammogram for malignant neoplasm of breast: Secondary | ICD-10-CM

## 2021-07-23 ENCOUNTER — Encounter (HOSPITAL_COMMUNITY): Payer: Self-pay | Admitting: Emergency Medicine

## 2021-07-23 ENCOUNTER — Ambulatory Visit (HOSPITAL_COMMUNITY)
Admission: EM | Admit: 2021-07-23 | Discharge: 2021-07-23 | Disposition: A | Discharge status: Home / Self Care | Payer: Medicare Other | Attending: Family Medicine | Admitting: Family Medicine

## 2021-07-23 ENCOUNTER — Other Ambulatory Visit: Payer: Self-pay

## 2021-07-23 DIAGNOSIS — R202 Paresthesia of skin: Secondary | ICD-10-CM | POA: Diagnosis not present

## 2021-07-23 NOTE — ED Notes (Signed)
Patient is being discharged from the Urgent Care and sent to the Emergency Department via pov . Per Dr Windy Carina, patient is in need of higher level of care due to limited studies at this location. Patient is aware and verbalizes understanding of plan of care.  Vitals:   07/23/21 1556  BP: (!) 141/85  Pulse: 85  Resp: 20  Temp: 98.8 F (37.1 C)  SpO2: 97%

## 2021-07-23 NOTE — ED Provider Notes (Signed)
[place]    CSN: [id number] Arrival date & time: 07/23/21  1502      History   Chief Complaint No chief complaint on file.   HPI Brooke [name] is a 66 y.o. female.   HPI Here with left hand lesion and pain.  History is initially obtained from the patient as she did not have family in the room with her at first.  She stated she had had this left hand tingling for about 1 week, coming and going.  It had been worse at night.  No headache, no fever and chills.  In weakness and expressive dysphasia.  No new motor weakness  Past Medical History:  Diagnosis Date   Anxiety    Aortic stenosis    a. mild by echo 05/2015.   Arthritis    Asthma    Bilateral pulmonary embolism ([place]) 02/2014   a. PCCM recommended lifelong anticoagulation.   Chronic back pain    Chronic bronchitis    Chronic pain    Colon cancer (HCC)    a. s/p surgery, chemotherapy.   COPD (chronic obstructive pulmonary disease) (HCC)    Depression    DVT (deep venous thrombosis) (HCC) 02/2014   Dyslipidemia    GERD (gastroesophageal reflux disease)    Hypertension    Migraine headache    Morbid obesity (HCC)    Obstructive sleep apnea    mild   Osteoarthritis    Ovarian cyst    PVC's (premature ventricular contractions) 2013   Stroke [facility])    Tobacco abuse    Uterine fibroid     Patient Active Problem List   Diagnosis Date Noted   COPD exacerbation ([place]) 08/10/2016   Acute respiratory failure ([place]) 08/08/2016   Acute bronchitis 08/08/2016   Neuropathy 10/29/2015   Seizures ([place]) 06/20/2015   Expressive aphasia    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   Stroke with cerebral ischemia (HCC)    Chronic obstructive pulmonary disease (HCC)    Hx of migraines    OSA (obstructive sleep apnea)    Hemiparesis, aphasia, and dysphagia as late effect of cerebrovascular accident (CVA) ([place])    Elevated troponin 05/28/2015   Essential hypertension 05/28/2015    Hyperkalemia 05/28/2015   History of pulmonary embolism 05/28/2015   History of DVT (deep vein thrombosis) 05/28/2015   Global aphasia    Aphasia    Weakness    Stroke ([place]) 05/26/2015   Acute tonsillitis 06/25/2014   Respiratory failure ([place]) 02/23/2014   Asthma, chronic 02/08/2014   Obstructive sleep apnea 02/08/2014   GERD (gastroesophageal reflux disease) 09/28/2013   Chest pain 04/19/2013   Obesity, Class III, BMI 40-49.9 (morbid obesity) ([place]) 08/23/2012   Constipation 08/20/2012   Headache(784.0) 08/20/2012   Chronic pain syndrome 08/19/2012   Hyperglycemia 08/19/2012   Insomnia 08/19/2012   Hypokalemia 10/21/2011   Tobacco abuse 10/21/2011   Arthritis 10/21/2011   Anxiety 10/21/2011   Depression 10/21/2011   Esophageal reflux 07/30/2011   Diarrhea following gastrointestinal surgery 07/30/2011   PVC (premature ventricular contraction) 07/10/2011   Morbid obesity ([place])    Hypertension    Dyslipidemia    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  MOUTH SURGERY     teeth extraction   SUBTOTAL COLECTOMY     TUBAL LIGATION      OB History     Gravida  2   Para  2   Term  2   Preterm      AB      Living  2      SAB      IAB      Ectopic      Multiple      Live Births               Home Medications    Prior to Admission medications   Medication Sig Start Date End Date Taking? Authorizing Provider  acetaminophen (TYLENOL) 500 MG tablet Take 1,000 mg by mouth every 8 (eight) hours as needed for moderate pain.  10/20/17   [provider]  albuterol (PROVENTIL HFA;VENTOLIN HFA) 108 (90 Base) MCG/ACT inhaler Inhale 2 puffs into the lungs every 6 (six) hours as needed for wheezing or shortness of breath (shortness of breath).  10/29/15   Charlott Rakes, MD  albuterol (PROVENTIL) (2.5 MG/3ML) 0.083% nebulizer solution Take 3 mLs (2.5 mg total) by nebulization every 6 (six) hours as needed for wheezing or shortness of breath. 11/10/17   Kirichenko, Lahoma Rocker, PA-C  amitriptyline (ELAVIL) 50 MG tablet Take 50 mg by mouth at bedtime.    [provider]  atorvastatin (LIPITOR) 40 MG tablet TAKE 1 TABLET DAILY AT 6 PM 01/22/16   Charlott Rakes, MD  Cholecalciferol (VITAMIN D3) 5000 units CAPS Take 5,000 Units by mouth daily. 12/29/17   [provider]  gabapentin (NEURONTIN) 600 MG tablet Take 600 mg by mouth 2 (two) times daily.  09/29/17   [provider]  hydrochlorothiazide (HYDRODIURIL) 12.5 MG tablet Take 12.5 mg by mouth daily. 02/24/17   [provider]  HYDROcodone-acetaminophen (NORCO/VICODIN) 5-325 MG tablet Take 1 tablet by mouth every 6 (six) hours as needed for severe pain. 01/04/18   Carmin Muskrat, MD  levETIRAcetam (KEPPRA) 500 MG tablet TAKE 1 TABLET TWICE DAILY 01/22/16   Charlott Rakes, MD  omeprazole (PRILOSEC) 40 MG capsule TAKE 1 CAPSULE EVERY DAY 09/17/15   Charlott Rakes, MD  oxyCODONE-acetaminophen (PERCOCET) 7.5-325 MG tablet Take 1 tablet by mouth every 8 (eight) hours as needed for severe pain.    [provider]  rivaroxaban (XARELTO) 20 MG TABS tablet TAKE ONE TABLET BY MOUTH ONCE DAILY IN THE EVENING WITH SUPPER 10/29/15   Charlott Rakes, MD    Family History Family History  Problem Relation Age of Onset   Uterine cancer Mother    Ovarian cancer Mother    Stroke Father    Diabetes Father    Hypertension Father    Diabetes Sister    Breast cancer Maternal Grandmother    Asthma Child    Asthma Child    Colon cancer Maternal Uncle     Social History Social History   Tobacco Use   Smoking status: Every Day    Packs/day: 0.50    Years: 37.00    Pack years: 18.50    Types: Cigarettes   Smokeless tobacco: Never   Tobacco comments:    trying  to quit, down to .5ppd X2 years ago.   Vaping Use   Vaping Use: Never used  Substance Use Topics   Alcohol use: No   Drug use: No     Allergies   Kiwi extract and Aspirin   Review of Systems  Review of Systems   Physical Exam Triage Vital Signs ED Triage Vitals  Enc Vitals Group     BP 07/23/21 1556 (!) 141/85     Pulse Rate 07/23/21 1556 85     Resp 07/23/21 1556 20     Temp 07/23/21 1556 98.8 F (37.1 C)     Temp Source 07/23/21 1556 Oral     SpO2 07/23/21 1556 97 %     Weight --      Height --      Head Circumference --      Peak Flow --      Pain Score 07/23/21 1553 10     Pain Loc --      Pain Edu? --      Excl. in [place]? --    No data found.  Updated Vital Signs BP (!) 141/85 (BP Location: Left Arm) Comment (BP Location): large cuff   Pulse 85    Temp 98.8 F (37.1 C) (Oral)    Resp 20    LMP 05/24/2003    SpO2 97%   Visual Acuity Right Eye Distance:   Left Eye Distance:   Bilateral Distance:    Right Eye Near:   Left Eye Near:    Bilateral Near:     Physical Exam Vitals reviewed.  Constitutional:      General: She is not in acute distress.    Appearance: She is not toxic-appearing.     Comments: In general this patient is very anxious while trying to get her words out.  She is in the end able to give a history.  Eyes:     Extraocular Movements: Extraocular movements intact.     Pupils: Pupils are equal, round, and reactive to light.  Cardiovascular:     Rate and Rhythm: Normal rate and regular rhythm.     Heart sounds: No murmur heard. Pulmonary:     Effort: Pulmonary effort is normal.     Breath sounds: Normal breath sounds.  Skin:    Capillary Refill: Capillary refill takes less than 2 seconds.  Neurological:     Mental Status: She is oriented to person, place, and time.     Comments: There is no facial droop appreciated today.  Right hand is weak.  There is possibly some decreased light touch sensation on her left palm  Psychiatric:         Behavior: Behavior normal.     UC Treatments / Results  Labs (all labs ordered are listed, but only abnormal results are displayed) Labs Reviewed - No data to display  EKG   Radiology No results found.  Procedures Procedures (including critical care time)  Medications Ordered in UC Medications - No data to display  Initial Impression / Assessment and Plan / UC Course  I have reviewed the triage vital signs and the nursing notes.  Pertinent labs & imaging results that were available during my care of the patient were reviewed by me and considered in my medical decision making (see chart for details).    While I was discussing with the patient the differential diagnosis for what was going on with her, and about offer going to the ER for possible advanced imaging, her family members came into the room.  I was able to briefly let them know that I thought it was low probability she was having a stroke, but that she could still certainly go to the ER for CAT scan or MRI and her son was  her immediately wanting her to go to the emergency room.  So they will proceed to one of our emergency departments for further evaluation Final Clinical Impressions(s) / UC Diagnoses   Final diagnoses:  Left hand paresthesia     Discharge Instructions      Please proceed to the ER    ED Prescriptions   None    PDMP not reviewed this encounter.   Barrett Henle, MD 07/23/21 [phone]

## 2021-07-23 NOTE — Discharge Instructions (Signed)
Please proceed to the ER 

## 2021-07-23 NOTE — ED Triage Notes (Signed)
Left hand feels like pins and needles.  Pain started 2 days ago and is getting worse.  Patient has a history of a stroke.

## 2021-07-24 ENCOUNTER — Other Ambulatory Visit: Payer: Self-pay

## 2021-07-24 ENCOUNTER — Encounter (HOSPITAL_COMMUNITY): Payer: Self-pay | Admitting: Student

## 2021-07-24 ENCOUNTER — Emergency Department (HOSPITAL_COMMUNITY)
Admission: EM | Admit: 2021-07-24 | Discharge: 2021-07-24 | Disposition: A | Discharge status: Home / Self Care | Payer: Medicare Other | Attending: Emergency Medicine | Admitting: Emergency Medicine

## 2021-07-24 ENCOUNTER — Emergency Department (HOSPITAL_COMMUNITY): Payer: Medicare Other

## 2021-07-24 DIAGNOSIS — J449 Chronic obstructive pulmonary disease, unspecified: Secondary | ICD-10-CM | POA: Diagnosis not present

## 2021-07-24 DIAGNOSIS — Z7901 Long term (current) use of anticoagulants: Secondary | ICD-10-CM | POA: Insufficient documentation

## 2021-07-24 DIAGNOSIS — M79642 Pain in left hand: Secondary | ICD-10-CM | POA: Diagnosis present

## 2021-07-24 DIAGNOSIS — Z79899 Other long term (current) drug therapy: Secondary | ICD-10-CM | POA: Insufficient documentation

## 2021-07-24 DIAGNOSIS — Z7951 Long term (current) use of inhaled steroids: Secondary | ICD-10-CM | POA: Diagnosis not present

## 2021-07-24 DIAGNOSIS — I10 Essential (primary) hypertension: Secondary | ICD-10-CM | POA: Diagnosis not present

## 2021-07-24 DIAGNOSIS — R200 Anesthesia of skin: Secondary | ICD-10-CM

## 2021-07-24 DIAGNOSIS — R531 Weakness: Secondary | ICD-10-CM | POA: Diagnosis not present

## 2021-07-24 DIAGNOSIS — R220 Localized swelling, mass and lump, head: Secondary | ICD-10-CM | POA: Diagnosis not present

## 2021-07-24 DIAGNOSIS — Z8673 Personal history of transient ischemic attack (TIA), and cerebral infarction without residual deficits: Secondary | ICD-10-CM | POA: Insufficient documentation

## 2021-07-24 LAB — COMPREHENSIVE METABOLIC PANEL
ALT: 11 U/L (ref 0–44)
AST: 20 U/L (ref 15–41)
Albumin: 3.5 g/dL (ref 3.5–5.0)
Alkaline Phosphatase: 61 U/L (ref 38–126)
Anion gap: 7 (ref 5–15)
BUN: 9 mg/dL (ref 8–23)
CO2: 28 mmol/L (ref 22–32)
Calcium: 8.6 mg/dL — ABNORMAL LOW (ref 8.9–10.3)
Chloride: 103 mmol/L (ref 98–111)
Creatinine, Ser: 0.75 mg/dL (ref 0.44–1.00)
GFR, Estimated: >60 mL/min (ref >60)
Glucose, Bld: 101 mg/dL — ABNORMAL HIGH (ref 70–99)
Potassium: 4.1 mmol/L (ref 3.5–5.1)
Sodium: 138 mmol/L (ref 135–145)
Total Bilirubin: 0.5 mg/dL (ref 0.3–1.2)
Total Protein: 6.7 g/dL (ref 6.5–8.1)

## 2021-07-24 LAB — CBC WITH DIFFERENTIAL/PLATELET
Abs Immature Granulocytes: 0 10*3/uL (ref 0.00–0.07)
Basophils Absolute: 0 10*3/uL (ref 0.0–0.1)
Basophils Relative: 0 %
Eosinophils Absolute: 0.2 10*3/uL (ref 0.0–0.5)
Eosinophils Relative: 4 %
HCT: 39.7 % (ref 36.0–46.0)
Hemoglobin: 13.2 g/dL (ref 12.0–15.0)
Immature Granulocytes: 0 %
Lymphocytes Relative: 49 %
Lymphs Abs: 1.9 10*3/uL (ref 0.7–4.0)
MCH: 34.3 pg — ABNORMAL HIGH (ref 26.0–34.0)
MCHC: 33.2 g/dL (ref 30.0–36.0)
MCV: 103.1 fL — ABNORMAL HIGH (ref 80.0–100.0)
Monocytes Absolute: 0.4 10*3/uL (ref 0.1–1.0)
Monocytes Relative: 11 %
Neutro Abs: 1.4 10*3/uL — ABNORMAL LOW (ref 1.7–7.7)
Neutrophils Relative %: 36 %
Platelets: 165 10*3/uL (ref 150–400)
RBC: 3.85 MIL/uL — ABNORMAL LOW (ref 3.87–5.11)
RDW: 14.6 % (ref 11.5–15.5)
WBC: 3.9 10*3/uL — ABNORMAL LOW (ref 4.0–10.5)
nRBC: 0 % (ref 0.0–0.2)

## 2021-07-24 LAB — URINALYSIS, ROUTINE W REFLEX MICROSCOPIC
Bacteria, UA: NONE SEEN
Bilirubin Urine: NEGATIVE
Glucose, UA: NEGATIVE mg/dL
Hgb urine dipstick: NEGATIVE
Ketones, ur: NEGATIVE mg/dL
Nitrite: NEGATIVE
Protein, ur: 30 mg/dL — AB
Specific Gravity, Urine: 1.025 (ref 1.005–1.030)
pH: 5 (ref 5.0–8.0)

## 2021-07-24 LAB — PROTIME-INR
INR: 1.1 (ref 0.8–1.2)
Prothrombin Time: 14.1 seconds (ref 11.4–15.2)

## 2021-07-24 LAB — APTT: aPTT: 32 seconds (ref 24–36)

## 2021-07-24 MED ORDER — OXYCODONE-ACETAMINOPHEN 5-325 MG PO TABS: 1.0000 | ORAL_TABLET | ORAL | 0 refills | Status: AC | PRN

## 2021-07-24 MED ORDER — OXYCODONE-ACETAMINOPHEN 5-325 MG PO TABS
1.0000 | ORAL_TABLET | Freq: Once | ORAL | Status: AC
Start: 2021-07-24 — End: 2021-07-24
  Administered 2021-07-24: 1 via ORAL
  Filled 2021-07-24: qty 1

## 2021-07-24 MED ORDER — LORAZEPAM 2 MG/ML IJ SOLN
1.0000 mg | Freq: Once | INTRAMUSCULAR | Status: AC
  Administered 2021-07-24: 1 mg via INTRAVENOUS
  Filled 2021-07-24: qty 1

## 2021-07-24 NOTE — Discharge Instructions (Signed)
Your MRI and CT imaging today did not show evidence of any new stroke.  I suspect your pain is more neuropathic and nerve in nature.  The x-ray also was reassuring of the hand.  No evidence of trauma.  Please take the pain medicine to help with your symptoms and follow-up with both your neurologist and your PCP for further pain management.  If any symptoms change or worsen, please return to the nearest emergency department.

## 2021-07-24 NOTE — ED Notes (Signed)
Patient transported to MRI 

## 2021-07-24 NOTE — ED Triage Notes (Signed)
Pt reported to ED with c/o severe pain to left upper extremity x1 week. Reports pain is so severe it has awakened her from her sleep.  Pt has hx of stroke with left sided residual and speech deficits.

## 2021-07-24 NOTE — ED Provider Triage Note (Signed)
Emergency Medicine Provider Triage Evaluation Note  Brooke [name] , a 66 y.o. female  was evaluated in triage.  Pt complains of left hand pain and numbness. I spoke with patient's sister for primary hx- she relays that patient has been complaining of LUE pain & paresthesias and that her speech has been worse over the past 1 week. @ baseline patient has some speech difficulty and right sided deficits S/p prior CVA. They are concerned she may have had another stroke so came to have her evaluated. Patient reports pain and paresthesias to the left hand.   Review of Systems  Per HPI.   Physical Exam  BP (!) 122/99 (BP Location: Right Arm)    Pulse 81    Temp 98.4 F (36.9 C) (Oral)    Resp (!) 5    LMP 05/24/2003    SpO2 99%  Gen:   Awake, no distress   Resp:  Normal effort  Other:  Alert, variable speech fluidity somewhat dysphasic, no obvious facial droop. Patient able to detect light touch x 4. Intact grip strength and strength with plantar/dorsiflexion bilaterally. Able to ambulate a few steps- limited assessment in triage. 2+ symmetric radial pulses. Some tenderness throughout the median nerve distribution throughout the left hand.   Medical Decision Making  Medically screening exam initiated at 4:58 AM.  Appropriate orders placed.  ESHIKA [name] was informed that the remainder of the evaluation will be completed by another provider, this initial triage assessment does not replace that evaluation, and the importance of remaining in the ED until their evaluation is complete.  Patient presenting with primary complaints of LUE pain/paresthesias, family expressing some concern about her speech progression x 1 week, baseline deficits w/ speech & RUE/RLE from prior stroke per family. > 24 hours from sxs onset, no code stroke. Pain/paresthesias seem primarily over the median nerve distribution of the LUE. She is somewhat dysphasic on exam- documented hx of this. Will obtain L hand xray, labs & CT  ordered for further evaluation.    Brooke [name], [place] 07/24/21 [id number]

## 2021-07-24 NOTE — ED Provider Notes (Signed)
[facility] EMERGENCY DEPARTMENT Provider Note   CSN: [id number] Arrival date & time: 07/24/21  0441     History  Chief Complaint  Patient presents with   Extremity Pain    Brooke [name] is a 66 y.o. female.  The history is provided by the patient, medical records and a relative. No language interpreter was used.  Extremity Pain This is a new problem. The current episode started more than 2 days ago. The problem occurs constantly. The problem has not changed since onset.Pertinent negatives include no chest pain, no abdominal pain, no headaches and no shortness of breath. Nothing aggravates the symptoms. Nothing relieves the symptoms. She has tried nothing for the symptoms. The treatment provided no relief.  Neurologic Problem This is a new problem. The current episode started more than 2 days ago. The problem occurs constantly. The problem has not changed since onset.Pertinent negatives include no chest pain, no abdominal pain, no headaches and no shortness of breath. Nothing aggravates the symptoms. Nothing relieves the symptoms. She has tried nothing for the symptoms. The treatment provided no relief.      Home Medications Prior to Admission medications   Medication Sig Start Date End Date Taking? Authorizing Provider  acetaminophen (TYLENOL) 500 MG tablet Take 1,000 mg by mouth every 8 (eight) hours as needed for moderate pain.  10/20/17   [provider]  albuterol (PROVENTIL HFA;VENTOLIN HFA) 108 (90 Base) MCG/ACT inhaler Inhale 2 puffs into the lungs every 6 (six) hours as needed for wheezing or shortness of breath (shortness of breath). 10/29/15   Charlott Rakes, MD  albuterol (PROVENTIL) (2.5 MG/3ML) 0.083% nebulizer solution Take 3 mLs (2.5 mg total) by nebulization every 6 (six) hours as needed for wheezing or shortness of breath. 11/10/17   Kirichenko, Lahoma Rocker, PA-C  amitriptyline (ELAVIL) 50 MG tablet Take 50 mg by mouth at bedtime.    [provider]  atorvastatin (LIPITOR) 40 MG tablet TAKE 1 TABLET DAILY AT 6 PM 01/22/16   Charlott Rakes, MD  Cholecalciferol (VITAMIN D3) 5000 units CAPS Take 5,000 Units by mouth daily. 12/29/17   [provider]  gabapentin (NEURONTIN) 600 MG tablet Take 600 mg by mouth 2 (two) times daily.  09/29/17   [provider]  hydrochlorothiazide (HYDRODIURIL) 12.5 MG tablet Take 12.5 mg by mouth daily. 02/24/17   [provider]  HYDROcodone-acetaminophen (NORCO/VICODIN) 5-325 MG tablet Take 1 tablet by mouth every 6 (six) hours as needed for severe pain. 01/04/18   Carmin Muskrat, MD  levETIRAcetam (KEPPRA) 500 MG tablet TAKE 1 TABLET TWICE DAILY 01/22/16   Charlott Rakes, MD  omeprazole (PRILOSEC) 40 MG capsule TAKE 1 CAPSULE EVERY DAY 09/17/15   Charlott Rakes, MD  oxyCODONE-acetaminophen (PERCOCET) 7.5-325 MG tablet Take 1 tablet by mouth every 8 (eight) hours as needed for severe pain.    [provider]  rivaroxaban (XARELTO) 20 MG TABS tablet TAKE ONE TABLET BY MOUTH ONCE DAILY IN THE EVENING WITH SUPPER 10/29/15   Charlott Rakes, MD      Allergies    Kiwi extract and Aspirin    Review of Systems   Review of Systems  Constitutional:  Negative for chills, diaphoresis, fatigue and fever.  HENT:  Negative for congestion.   Eyes:  Negative for visual disturbance.  Respiratory:  Negative for cough, chest tightness, shortness of breath and wheezing.   Cardiovascular:  Negative for chest pain.  Gastrointestinal:  Negative for abdominal pain, constipation, diarrhea, nausea and vomiting.  Genitourinary:  Negative for dysuria, flank pain and frequency.  Musculoskeletal:  Negative for back pain, neck pain and neck stiffness.  Skin:  Negative for rash and wound.  Neurological:  Positive for speech difficulty (at baseline), weakness and numbness. Negative for dizziness, facial asymmetry, light-headedness and headaches.  Psychiatric/Behavioral:  Negative for  agitation and confusion.   All other systems reviewed and are negative.  Physical Exam Updated Vital Signs BP (!) 122/99 (BP Location: Right Arm)    Pulse 81    Temp 98.4 F (36.9 C) (Oral)    Resp (!) 5    LMP 05/24/2003    SpO2 99%  Physical Exam Vitals and nursing note reviewed.  Constitutional:      General: She is not in acute distress.    Appearance: She is well-developed. She is not ill-appearing, toxic-appearing or diaphoretic.  HENT:     Head: Normocephalic and atraumatic.     Nose: No congestion or rhinorrhea.     Mouth/Throat:     Mouth: Mucous membranes are moist.     Pharynx: No oropharyngeal exudate or posterior oropharyngeal erythema.  Eyes:     Extraocular Movements: Extraocular movements intact.     Conjunctiva/sclera: Conjunctivae normal.     Pupils: Pupils are equal, round, and reactive to light.  Cardiovascular:     Rate and Rhythm: Normal rate and regular rhythm.     Pulses: Normal pulses.     Heart sounds: No murmur heard. Pulmonary:     Effort: Pulmonary effort is normal. No respiratory distress.     Breath sounds: Normal breath sounds. No wheezing, rhonchi or rales.  Chest:     Chest wall: No tenderness.  Abdominal:     General: Abdomen is flat.     Palpations: Abdomen is soft.     Tenderness: There is no abdominal tenderness. There is no right CVA tenderness, left CVA tenderness, guarding or rebound.  Musculoskeletal:        General: No swelling or tenderness.     Cervical back: Neck supple. No tenderness.     Right lower leg: No edema.     Left lower leg: No edema.  Skin:    General: Skin is warm and dry.     Capillary Refill: Capillary refill takes less than 2 seconds.     Findings: No erythema or rash.  Neurological:     Mental Status: She is alert.     Sensory: Sensory deficit present.     Motor: Weakness present.     Comments: Patient has baseline weakness in right arm and right leg but has reported new weakness in left arm and left leg  although they are both stronger than the right side.  Patient also reports numbness in the left face, left arm, left leg that is new and similar to her chronic numbness in the right face, right arm, right leg.  No tenderness in the neck or head.  No tenderness in the arm.  Hoffmann negative in the left hand.   Psychiatric:        Mood and Affect: Mood normal.    ED Results / Procedures / Treatments   Labs (all labs ordered are listed, but only abnormal results are displayed) Labs Reviewed  COMPREHENSIVE METABOLIC PANEL - Abnormal; Notable for the following components:      Result Value   Glucose, Bld 101 (*)    Calcium 8.6 (*)    All other components within normal limits  URINALYSIS, ROUTINE W  REFLEX MICROSCOPIC - Abnormal; Notable for the following components:   Color, Urine AMBER (*)    APPearance CLOUDY (*)    Protein, ur 30 (*)    Leukocytes,Ua SMALL (*)    All other components within normal limits  CBC WITH DIFFERENTIAL/PLATELET - Abnormal; Notable for the following components:   WBC 3.9 (*)    RBC 3.85 (*)    MCV 103.1 (*)    MCH 34.3 (*)    Neutro Abs 1.4 (*)    All other components within normal limits  PROTIME-INR  APTT    EKG EKG Interpretation  Date/Time:  Wednesday July 24 2021 04:51:18 EST Ventricular Rate:  89 PR Interval:  144 QRS Duration: 72 QT Interval:  346 QTC Calculation: 420 R Axis:   54 Text Interpretation: Normal sinus rhythm Normal ECG When compared with ECG of 04-Jan-2018 08:59, Premature ventricular complexes are no longer present Confirmed by Delora Fuel ([id number]) on 07/24/2021 7:00:46 AM  Radiology CT Head Wo Contrast  Result Date: 07/24/2021 CLINICAL DATA:  66 year old female with neurologic deficit. Previous left MCA infarct. EXAM: CT HEAD WITHOUT CONTRAST TECHNIQUE: Contiguous axial images were obtained from the base of the skull through the vertex without intravenous contrast. RADIATION DOSE REDUCTION: This exam was performed according  to the departmental dose-optimization program which includes automated exposure control, adjustment of the mA and/or kV according to patient size and/or use of iterative reconstruction technique. COMPARISON:  Brain MRI 03/27/2017, head CT 03/26/2017. FINDINGS: Brain: Chronic encephalomalacia in the left MCA territory, middle and posterior MCA division with mild ex vacuo enlargement of the left lateral ventricle. Stable gray-white matter differentiation throughout the brain since 2018. No midline shift, ventriculomegaly, mass effect, evidence of mass lesion, intracranial hemorrhage or evidence of cortically based acute infarction. Mild vascular calcifications in the bilateral basal ganglia. Vascular: Mild Calcified atherosclerosis at the skull base. No suspicious intracranial vascular hyperdensity. Skull: No acute osseous abnormality identified. Sinuses/Orbits: Visualized paranasal sinuses and mastoids are stable and well aerated. Other: Disconjugate gaze, otherwise negative orbit and scalp soft tissues. IMPRESSION: No acute intracranial abnormality. Stable non contrast CT appearance of the brain since 2018 with chronic left MCA infarct. Electronically Signed   By: Genevie Ann M.D.   On: 07/24/2021 06:29   MR BRAIN WO CONTRAST  Result Date: 07/24/2021 CLINICAL DATA:  66 year old female with neurologic deficit. Remote left MCA infarct. EXAM: MRI HEAD WITHOUT CONTRAST TECHNIQUE: Multiplanar, multiecho pulse sequences of the brain and surrounding structures were obtained without intravenous contrast. COMPARISON:  Plain head CT 0526 hours today.  Brain MRI 03/27/2017. FINDINGS: Brain: Chronic encephalomalacia in the left MCA territory with gliosis along the margins, and hemosiderin, stable since 2018. Stable mild ex vacuo enlargement of the left lateral ventricle. No significant brainstem Wallerian degeneration. No superimposed restricted diffusion to suggest acute infarction. No midline shift, mass effect, evidence of  mass lesion, ventriculomegaly, extra-axial collection or acute intracranial hemorrhage. Cervicomedullary junction and pituitary are within normal limits. Elsewhere stable and essentially normal for age gray and white matter signal. No other chronic cerebral blood products identified. Vascular: Major intracranial vascular flow voids are stable since 2018. Skull and upper cervical spine: Negative visible cervical spine. Visualized bone marrow signal is within normal limits. Sinuses/Orbits: Stable, negative. Other: Grossly normal visible internal auditory structures. Negative visible scalp and face. IMPRESSION: 1. No acute intracranial abnormality. 2. Stable since 2018 with chronic Left MCA territory encephalomalacia. Electronically Signed   By: Genevie Ann M.D.   On: 07/24/2021 10:43  DG Hand Complete Left  Result Date: 07/24/2021 CLINICAL DATA:  67 year old female with neurologic deficit. Left hand and wrist pain. EXAM: LEFT HAND - COMPLETE 3+ VIEW COMPARISON:  Report of left wrist series 06/29/2001 (no images available). FINDINGS: Metal bracelet artifact at the left wrist, obscuring the distal radius and ulna. Carpal bone alignment maintained. Metacarpals and phalanges intact. Some intermittent degenerative joint space loss in the left hand and wrist, most pronounced at the 2nd and 3rd DIP with typical appearance of osteoarthritis there. IMPRESSION: 1. No acute osseous abnormality identified. Bracelet artifact obscuring part of the wrist. 2. Osteoarthritis. Electronically Signed   By: Genevie Ann M.D.   On: 07/24/2021 06:31    Procedures Procedures    Medications Ordered in ED Medications  LORazepam (ATIVAN) injection 1 mg (1 mg Intravenous Given 07/24/21 0936)  oxyCODONE-acetaminophen (PERCOCET/ROXICET) 5-325 MG per tablet 1 tablet (1 tablet Oral Given 07/24/21 1129)    ED Course/ Medical Decision Making/ A&P                           Medical Decision Making Amount and/or Complexity of Data  Reviewed Radiology: ordered.  Risk Prescription drug management.    ZULEYMA SCHARF is a 66 y.o. female with a past medical history significant for previous stroke with right-sided numbness, weakness, and aphasia, chronic pain, hypertension, dyslipidemia, obesity, previous DVT on Xarelto, seizures on Keppra, and COPD who presents with several days of left hemibody numbness, left hand and left leg weakness, and left hand pain.  According to patient, her right hand always has chronic pain with the numbness and weakness from previous stroke.  She reports that sometime in the last week, she started having symptoms of left hand pain with numbness and weakness.  She is saying that she is also having some numbness in her left face, left arm, and left leg and feels weak in the left leg.  She reports that this is all new.  She denies any new trauma.  She reports that her speech and vision are unchanged.  She denies any headache, neck pain, or back pains.  She denies any trauma.  She denies any nausea, vomiting, constipation, diarrhea, or other complaints.  On my exam, patient does have numbness in the left face, left arm, and left leg which she reports is new compared to a week ago.  She is not sure what day this started.  She reports that the right side is always weak and numb.  Her's face has symmetric smile but her left grip strength is not very strong.  She thinks it is worse than baseline but is still improved from her right side.  Her left leg she reports is subjectively weak compared to baseline but it is stronger than the right side.  Hoffmann was negative on her left hand and right hand.  Neck was nontender.  Symmetric smile.  Speech was aphasic but she was able to tell me what she needed to.  Pupils were symmetric and reactive normal extraocular movements.  Lungs clear, chest and abdomen nontender.  Patient otherwise well-appearing.  Patient was seen in triage and had CT of the head and x-ray of the  hand ordered for the pain and the unilateral symptoms.  CT of the head did not show any acute abnormality with old stroke still being seen.  Hand x-ray showed no fracture.  Labs overall reassuring without any evidence of an acute cause of her symptoms.  Due to her symptoms and high concern for stroke, will get MRI to rule out stroke.  Anticipate reassessment after MRI.  MRI shows no acute stroke.  Patient still complaining of pain in her left hand.  Given rule out of stroke suspect this is more neuropathic pain similar to the pain she chronically has in her right hand.  Patient reports she already takes gabapentin and does not want that.  We will give a pain pill and give her several tablets of pain medicine at discharge and she needs to follow-up with her outpatient PCP and outpatient neurology team for further neuropathic pain management.  She other worsens or concerns and understood return precautions.  Patient discharged in good condition.           Final Clinical Impression(s) / ED Diagnoses Final diagnoses:  Left sided numbness  Left-sided weakness  Left hand pain    Rx / DC Orders ED Discharge Orders          Ordered    oxyCODONE-acetaminophen (PERCOCET/ROXICET) 5-325 MG tablet  Every 4 hours PRN        07/24/21 1145            Clinical Impression: 1. Left hand pain   2. Left sided numbness   3. Left-sided weakness     Disposition: Discharge  Condition: Good  I have discussed the results, Dx and Tx plan with the pt(& family if present). He/she/they expressed understanding and agree(s) with the plan. Discharge instructions discussed at great length. Strict return precautions discussed and pt &/or family have verbalized understanding of the instructions. No further questions at time of discharge.    New Prescriptions   OXYCODONE-ACETAMINOPHEN (PERCOCET/ROXICET) 5-325 MG TABLET    Take 1 tablet by mouth every 4 (four) hours as needed for severe pain.     Follow Up: No follow-up provider specified.     Analiza Cowger, Gwenyth Allegra, MD 07/24/21 1147

## 2022-11-05 ENCOUNTER — Other Ambulatory Visit: Payer: Self-pay | Admitting: Nurse Practitioner

## 2022-11-05 DIAGNOSIS — J42 Unspecified chronic bronchitis: Secondary | ICD-10-CM

## 2022-12-24 ENCOUNTER — Other Ambulatory Visit: Payer: Self-pay | Admitting: Nurse Practitioner

## 2022-12-24 DIAGNOSIS — M545 Low back pain, unspecified: Secondary | ICD-10-CM

## 2023-03-30 IMAGING — MG MM DIGITAL SCREENING BILAT W/ TOMO AND CAD
6 of 10 series · 6 of 30 positions shown · non-contrast
Comparison: Previous exam(s).

CLINICAL DATA: Screening.

EXAM:
DIGITAL SCREENING BILATERAL MAMMOGRAM WITH TOMOSYNTHESIS AND CAD
TECHNIQUE: Bilateral screening digital craniocaudal and mediolateral oblique
mammograms were obtained. Bilateral screening digital breast
tomosynthesis was performed. The images were evaluated with
computer-aided detection.

[L CC synth-2D]
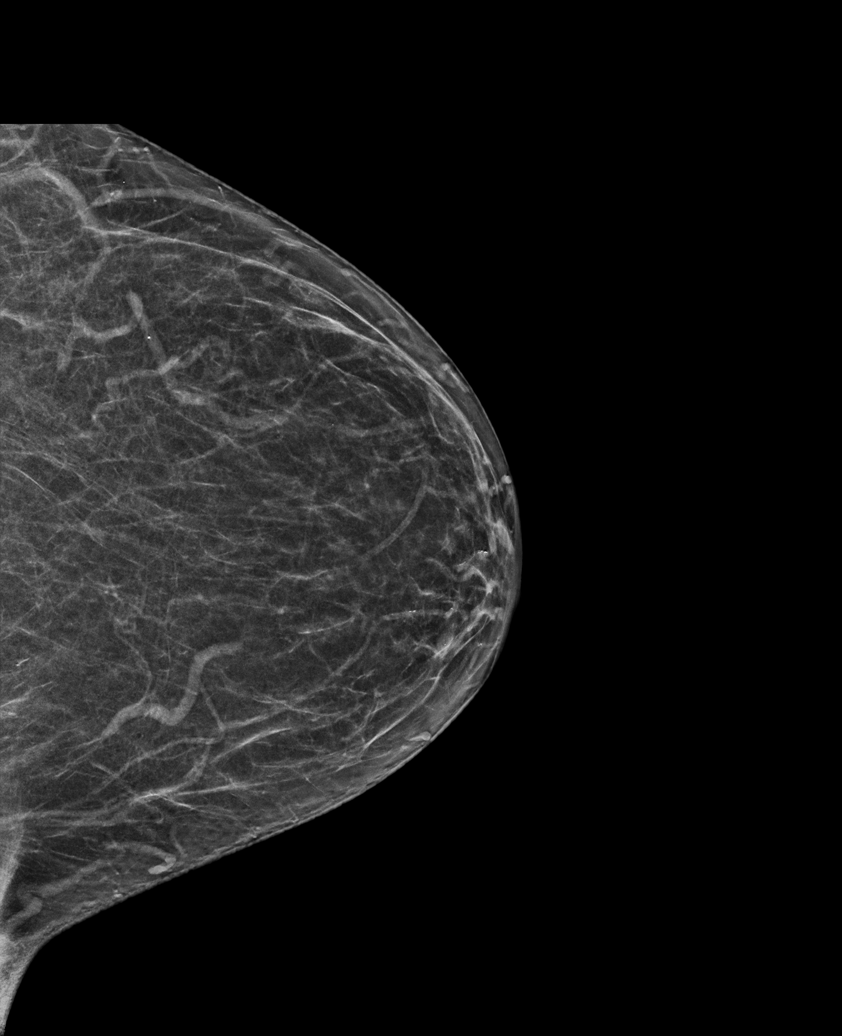

[R MLO synth-2D (1 of 2)]
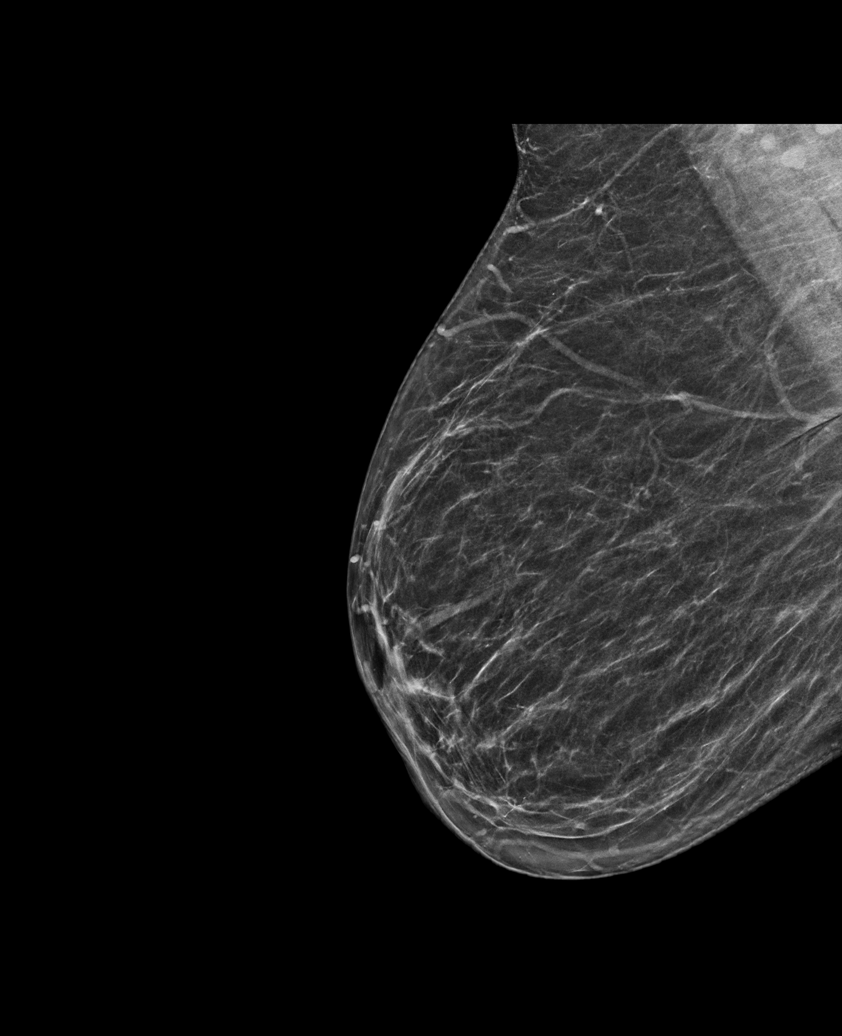

[L MLO synth-2D]
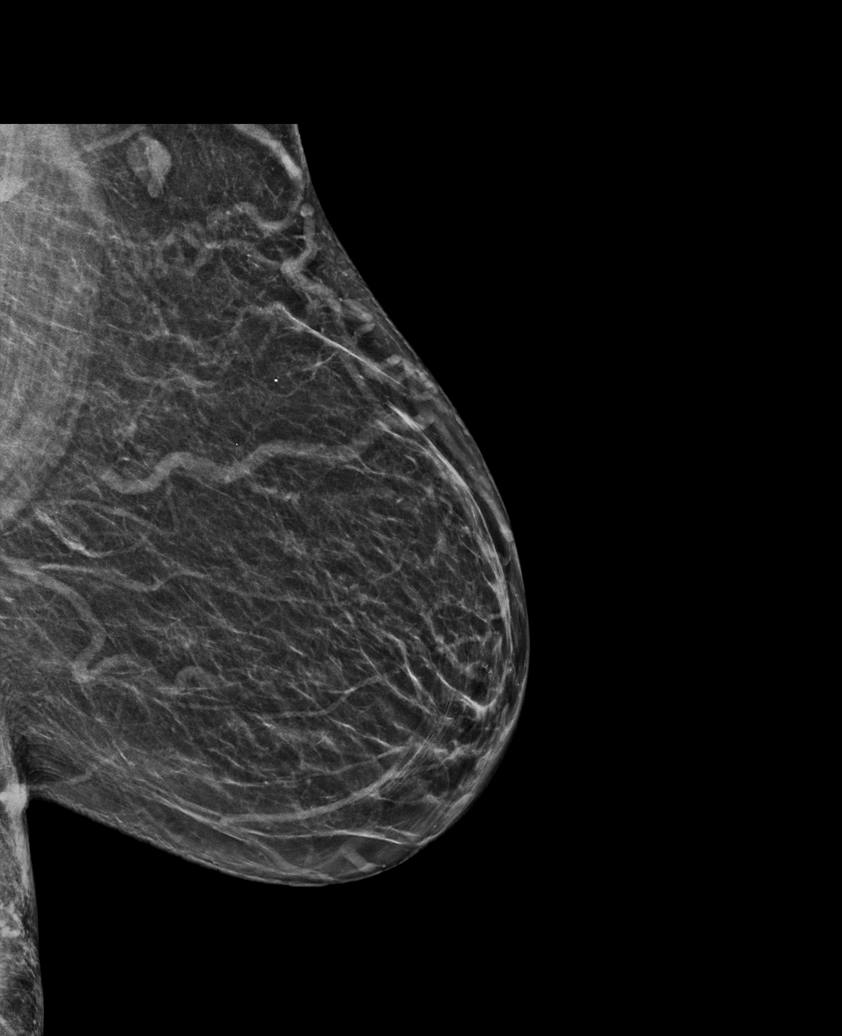

[R MLO synth-2D (2 of 2)]
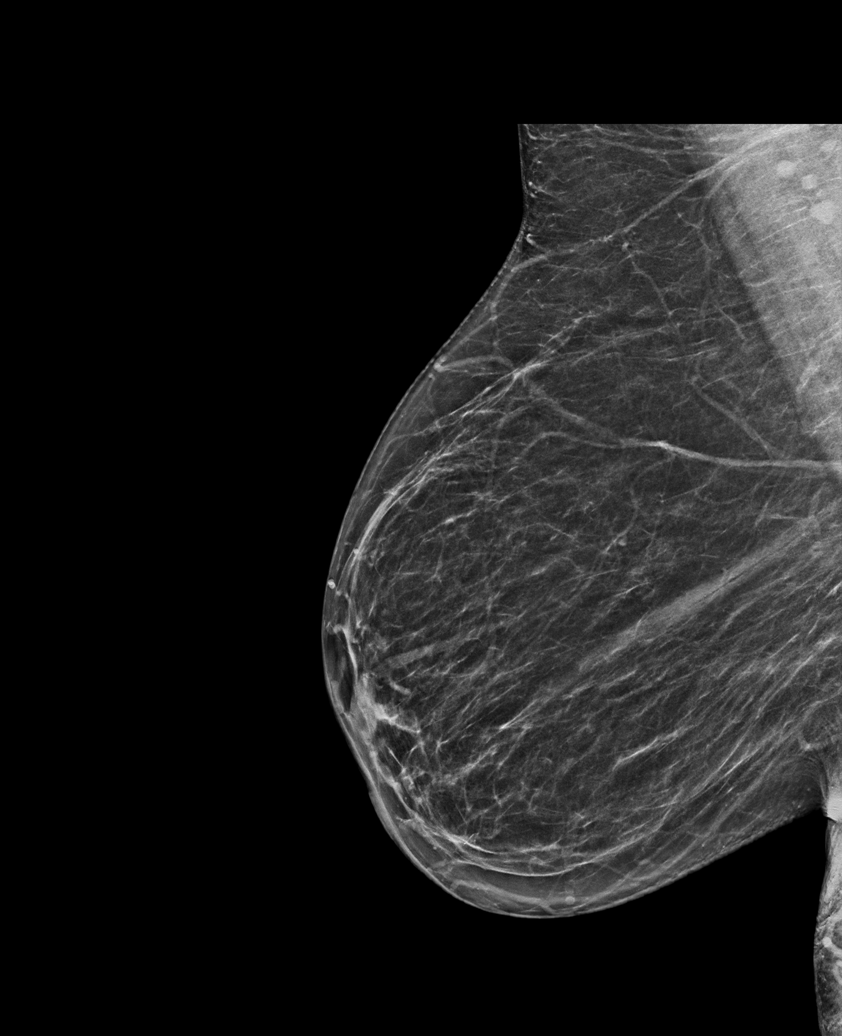

[R CC synth-2D]
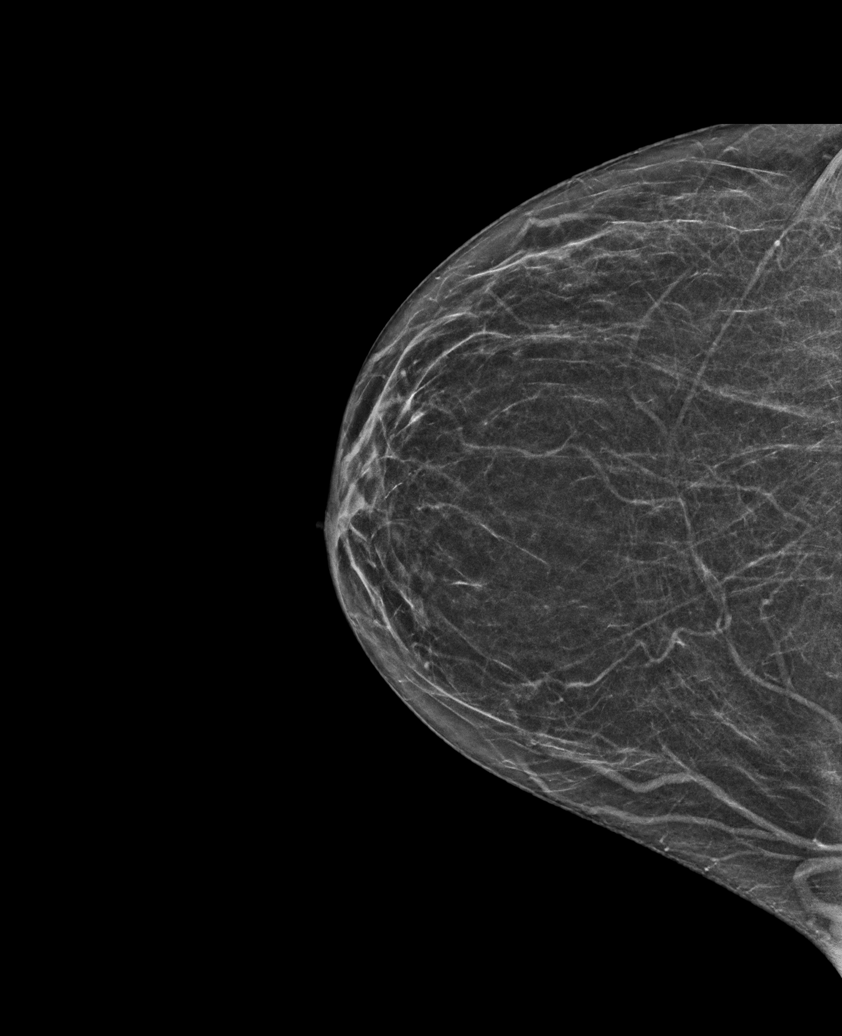

[R MLO tomo · tomo slice 33/64.0]
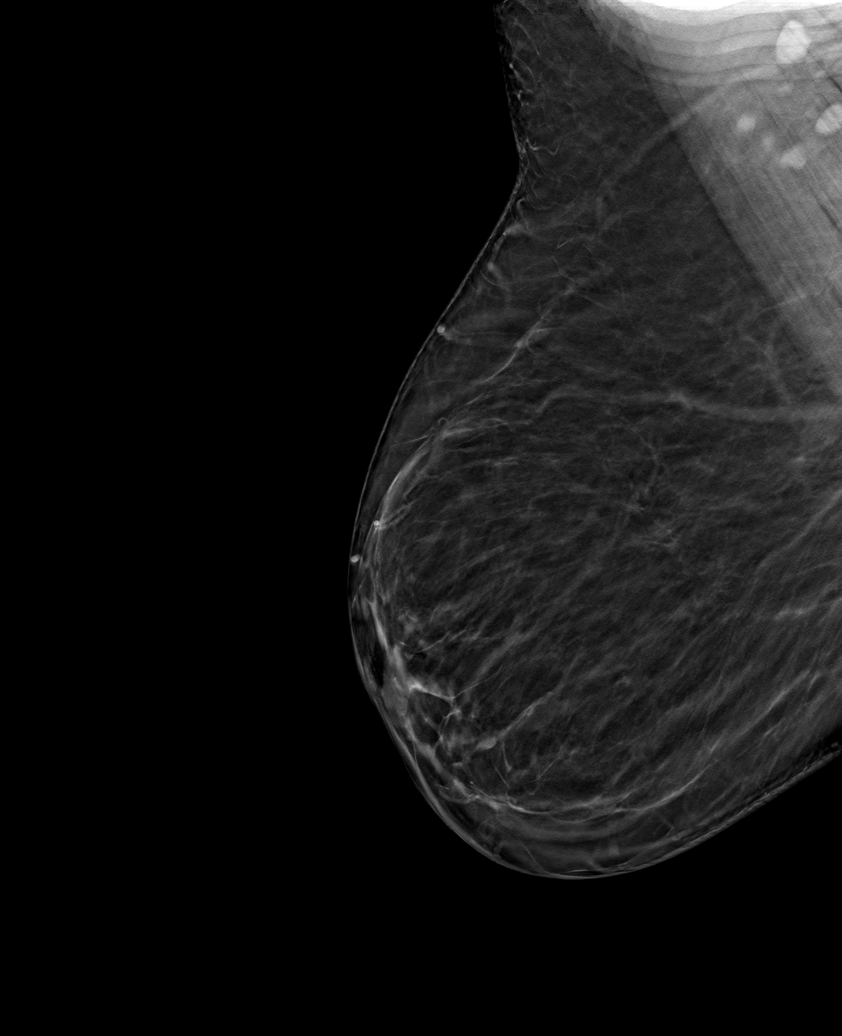

[6 of 30 positions shown; findings below may reference images not displayed]

ACR Breast Density Category b: There are scattered areas of
fibroglandular density.
FINDINGS: There are no findings suspicious for malignancy.
IMPRESSION: No mammographic evidence of malignancy. A result letter of this
screening mammogram will be mailed directly to the patient.

RECOMMENDATION:
Screening mammogram in one year. (Code:51-O-LD2)

BI-RADS CATEGORY  1: Negative.

## 2023-04-04 IMAGING — MR MR HEAD W/O CM
6 of 10 series · 28 of 48 positions shown · non-contrast
Comparison: Plain head CT 3034 hours today.  Brain MRI 03/27/2017.

CLINICAL DATA: 65-year-old female with neurologic deficit. Remote
left MCA infarct.

EXAM:
MRI HEAD WITHOUT CONTRAST
TECHNIQUE: Multiplanar, multiecho pulse sequences of the brain and surrounding
structures were obtained without intravenous contrast.

[Series 2: DWI · axial · 3.0mm · 0.94mm/px · z∈[-126,+8]mm · 8 of 100 slices shown (1 of 2)]
[im 1/100]
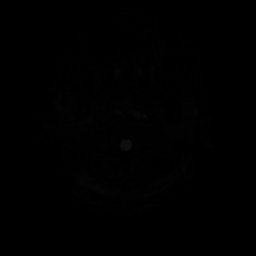
[im 12/100]
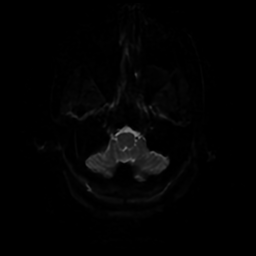
[im 34/100]
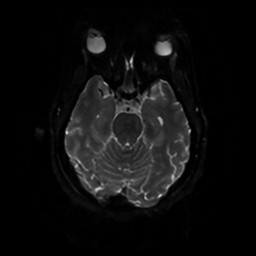
[im 45/100]
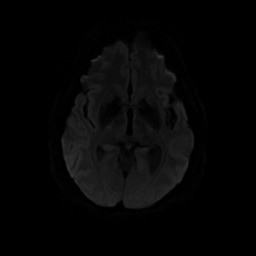
[im 56/100]
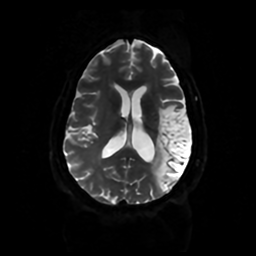
[im 67/100]
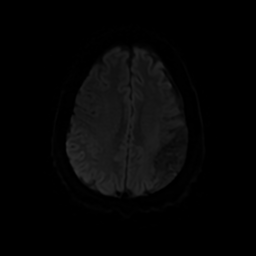
[im 89/100]
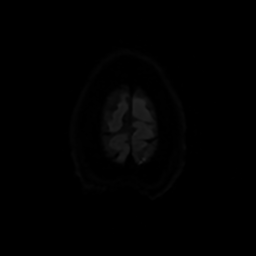
[im 100/100]
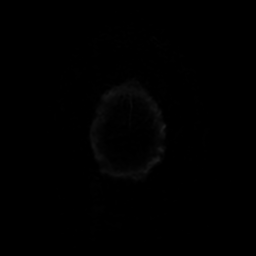

[Series 3: DWI · coronal · 4.0mm · 0.94mm/px · 7 of 74 slices shown (2 of 2)]
[im 1/74]
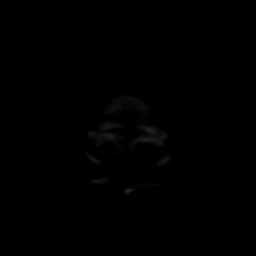
[im 13/74]
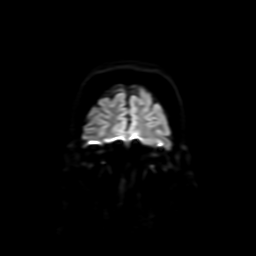
[im 25/74]
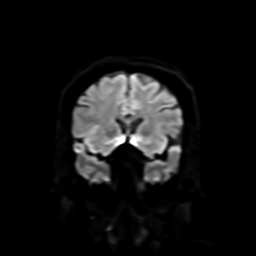
[im 37/74]
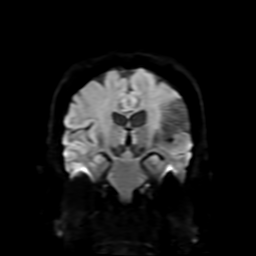
[im 49/74]
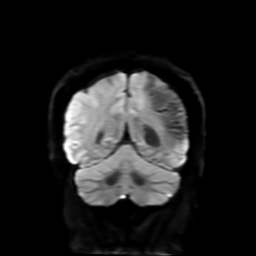
[im 61/74]
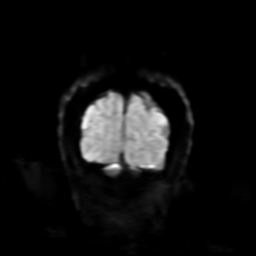
[im 74/74]
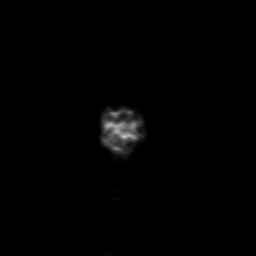

[Series 4: FLAIR · sagittal · 5.0mm · 0.23mm/px · 2 of 23 slices shown (1 of 2)]
[im 1/23]
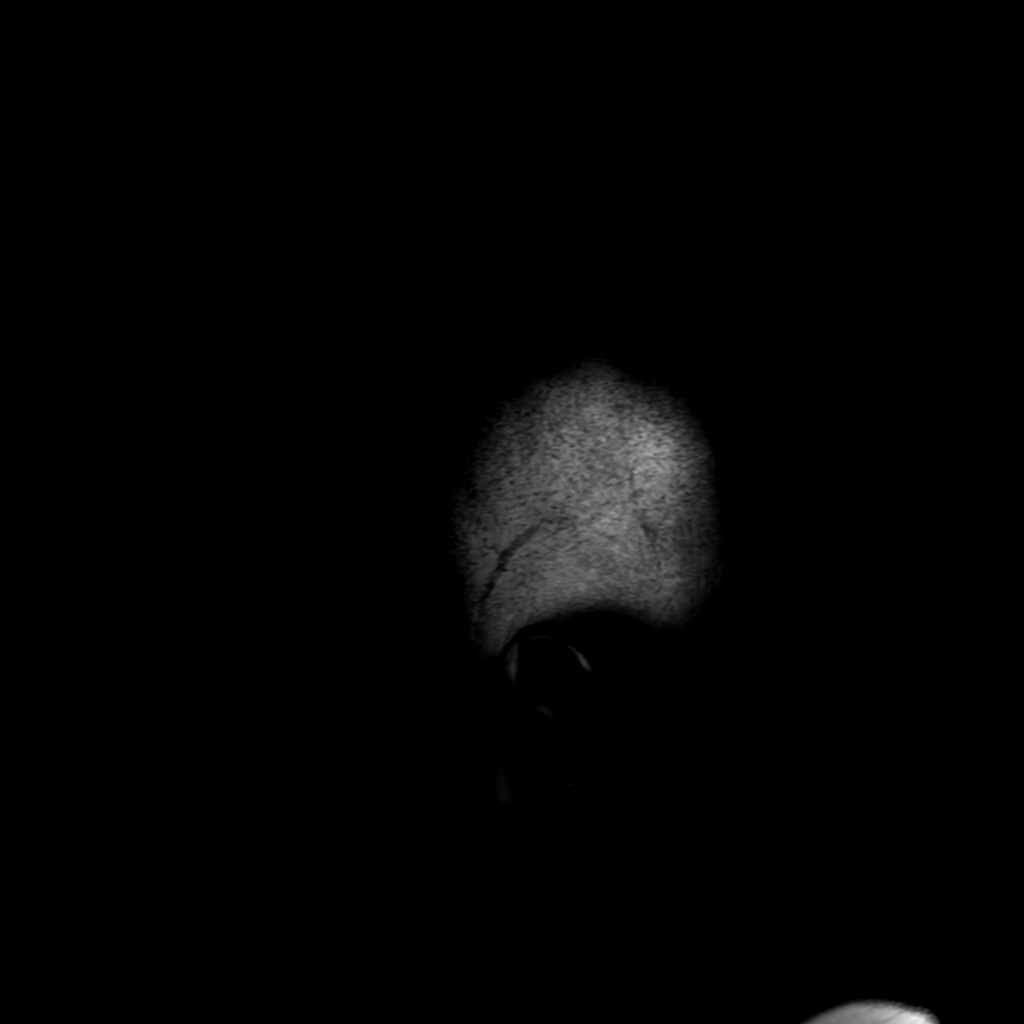
[im 23/23]
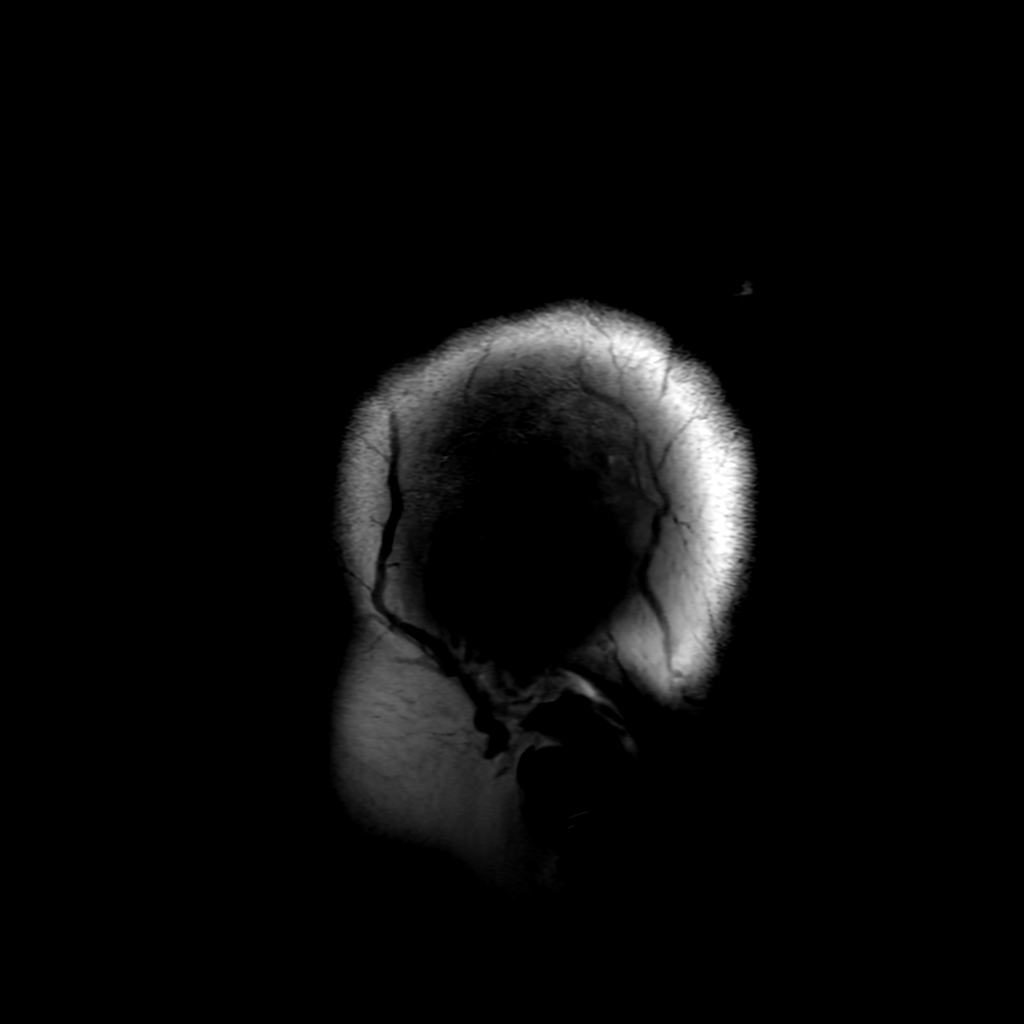

[Series 6: FLAIR · axial · 4.0mm · 0.45mm/px · z∈[-123,+17]mm · 3 of 35 slices shown (2 of 2)]
[im 1/35]
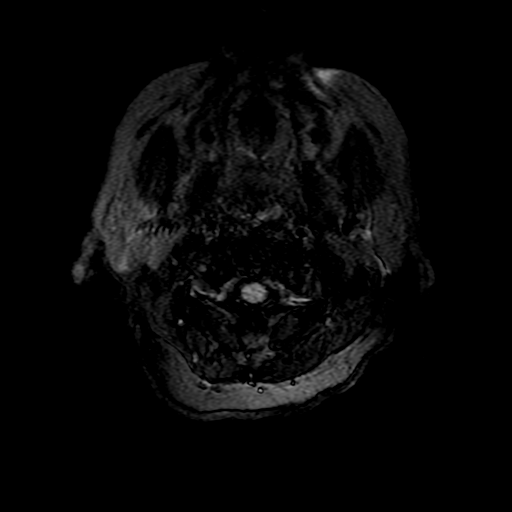
[im 18/35]
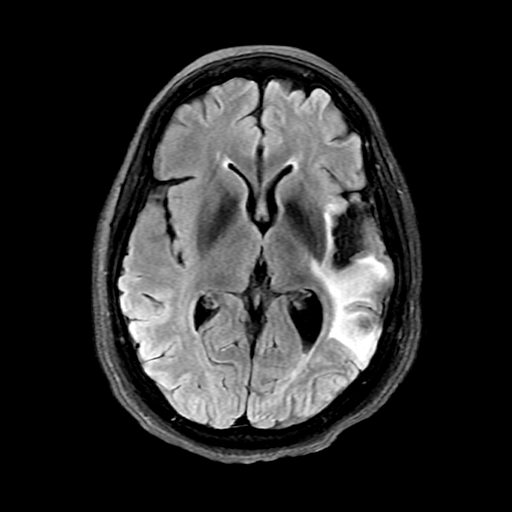
[im 35/35]
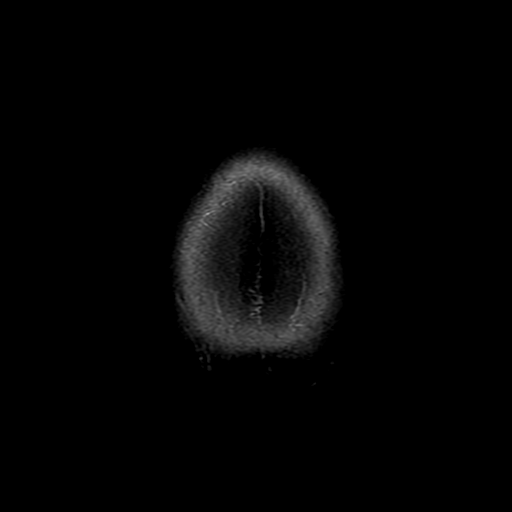

[Series 250: ADC · axial · 3.0mm · 0.94mm/px · z∈[-126,+8]mm · 5 of 50 slices shown (1 of 2)]
[im 1/50]
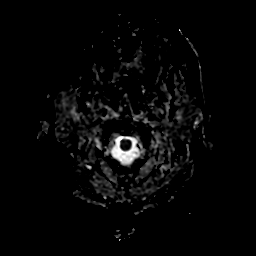
[im 13/50]
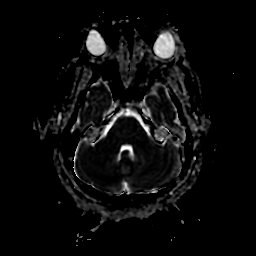
[im 25/50]
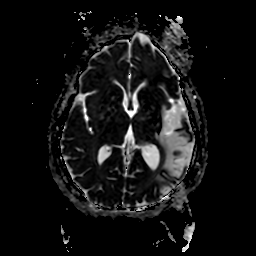
[im 37/50]
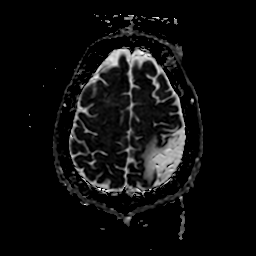
[im 50/50]
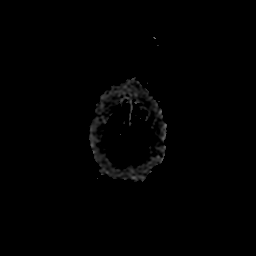

[Series 350: ADC · coronal · 4.0mm · 0.94mm/px · 3 of 37 slices shown (2 of 2)]
[im 1/37]
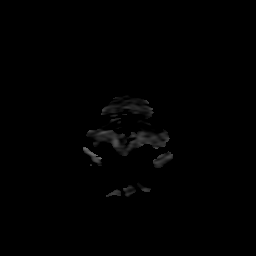
[im 19/37]
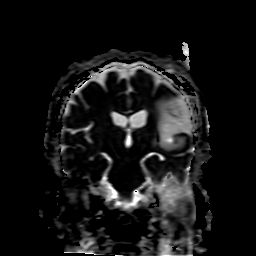
[im 37/37]
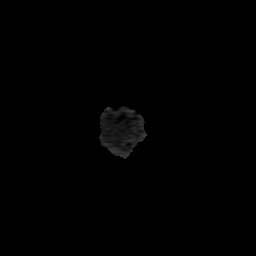

[28 of 48 positions shown; findings below may reference images not displayed]

FINDINGS: Brain: Chronic encephalomalacia in the left MCA territory with
gliosis along the margins, and hemosiderin, stable since 0289.
Stable mild ex vacuo enlargement of the left lateral ventricle. No
significant brainstem Wallerian degeneration.

No superimposed restricted diffusion to suggest acute infarction. No
midline shift, mass effect, evidence of mass lesion,
ventriculomegaly, extra-axial collection or acute intracranial
hemorrhage. Cervicomedullary junction and pituitary are within
normal limits.

Elsewhere stable and essentially normal for age gray and white
matter signal. No other chronic cerebral blood products identified.

Vascular: Major intracranial vascular flow voids are stable since
0289.

Skull and upper cervical spine: Negative visible cervical spine.
Visualized bone marrow signal is within normal limits.

Sinuses/Orbits: Stable, negative.

Other: Grossly normal visible internal auditory structures. Negative
visible scalp and face.
IMPRESSION: 1. No acute intracranial abnormality.
2. Stable since 0289 with chronic Left MCA territory
encephalomalacia.

## 2023-04-04 IMAGING — CT CT HEAD W/O CM
3 of 4 series · 15 of 47 positions shown, 18 images · non-contrast
Comparison: Brain MRI 03/27/2017, head CT 03/26/2017.

CLINICAL DATA: 65-year-old female with neurologic deficit. Previous
left MCA infarct.



[Series 3: head 5.0 h30s · axial · 0.43mm/px · z∈[-60,+70]mm · 9 of 32 slices shown, 12 images]
[im 3/32  brain]
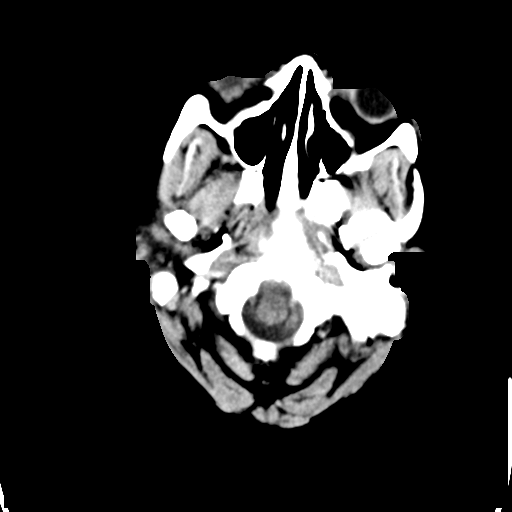
[im 3/32  bone]
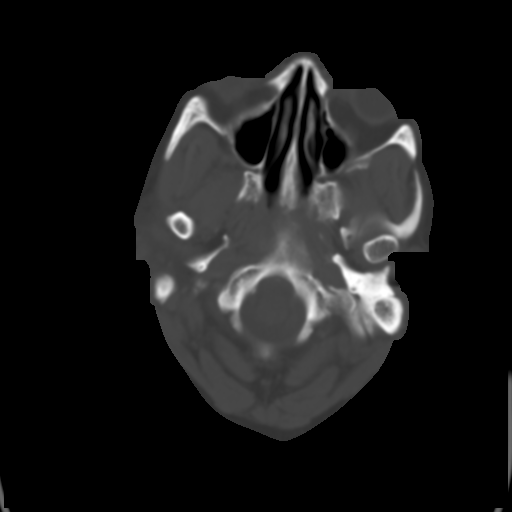
[im 7/32  brain]
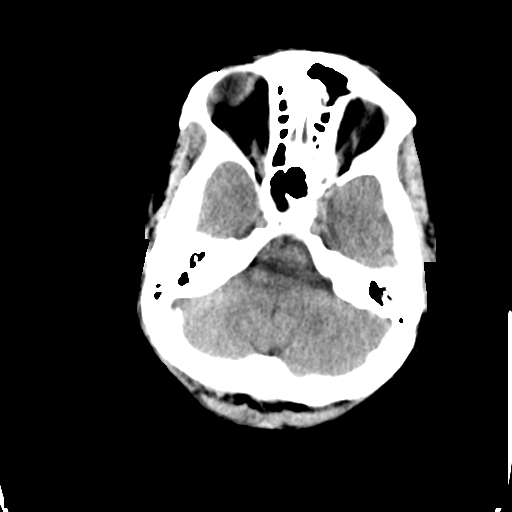
[im 9/32  brain]
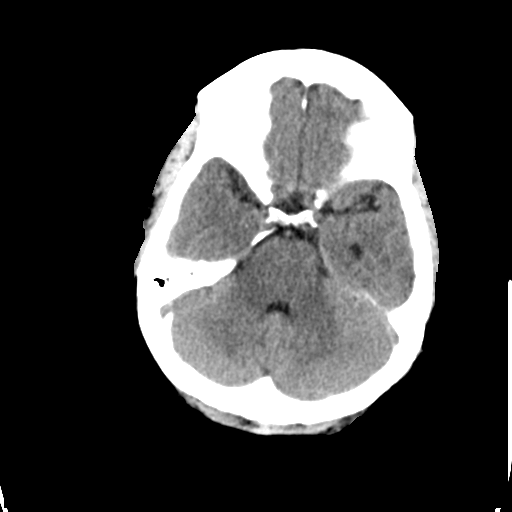
[im 14/32  brain]
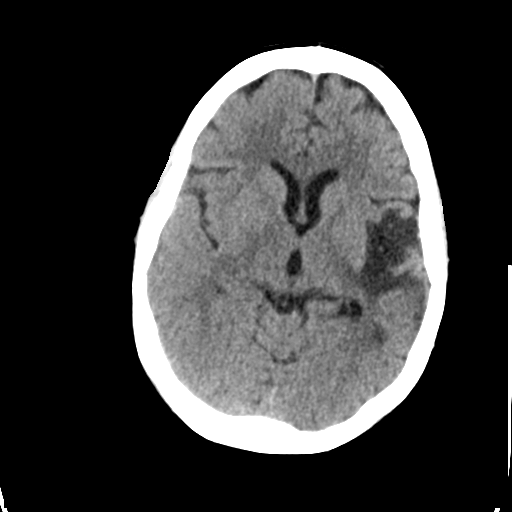
[im 16/32  brain]
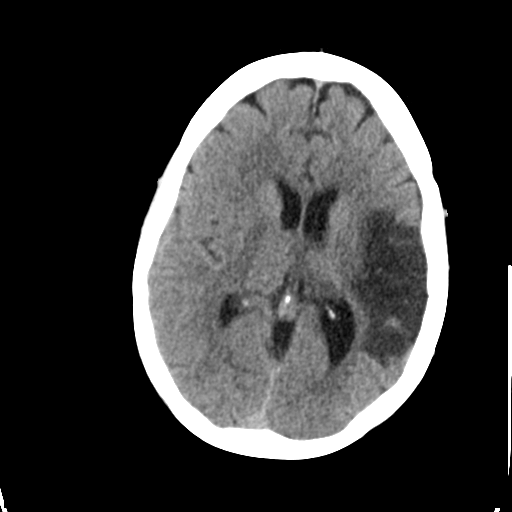
[im 16/32  bone]
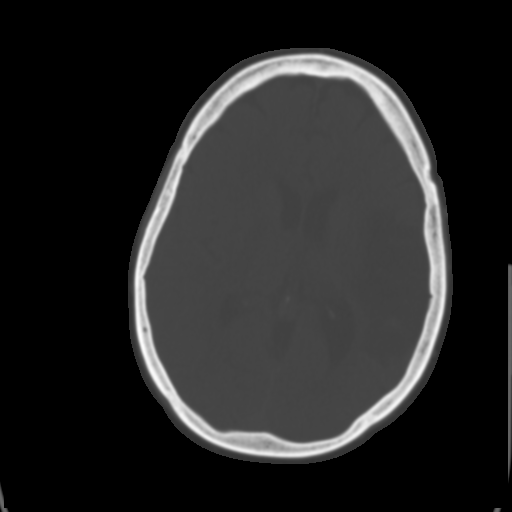
[im 18/32  brain]
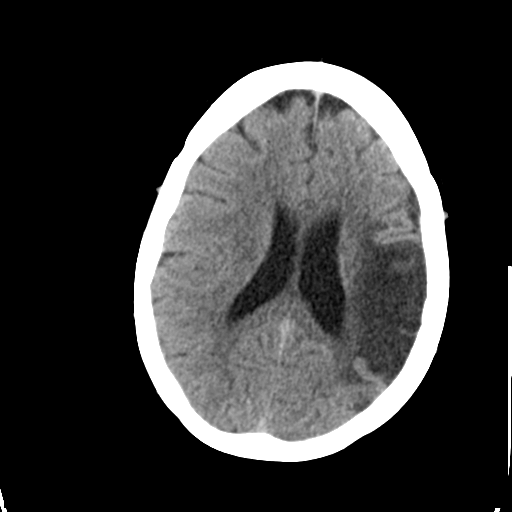
[im 23/32  brain]
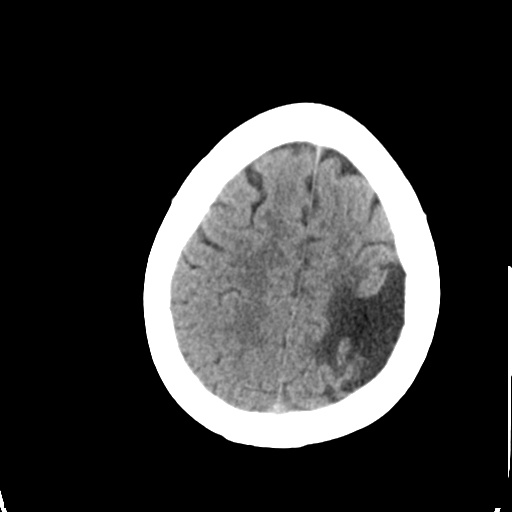
[im 25/32  brain]
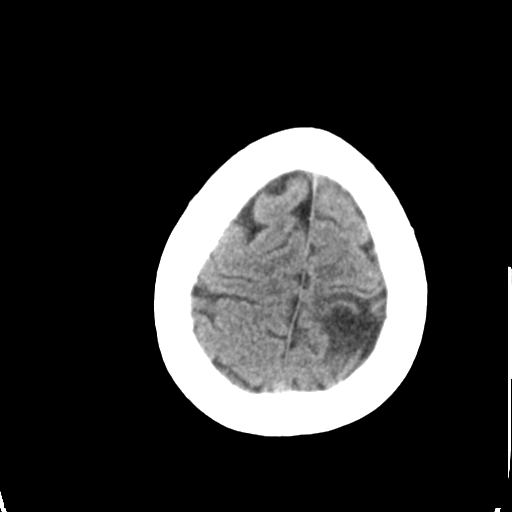
[im 29/32  brain]
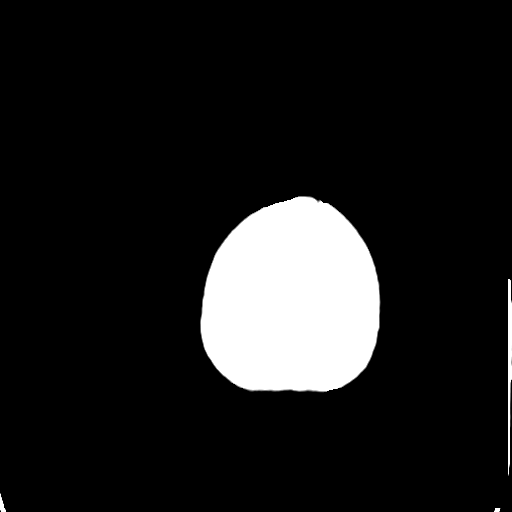
[im 29/32  bone]
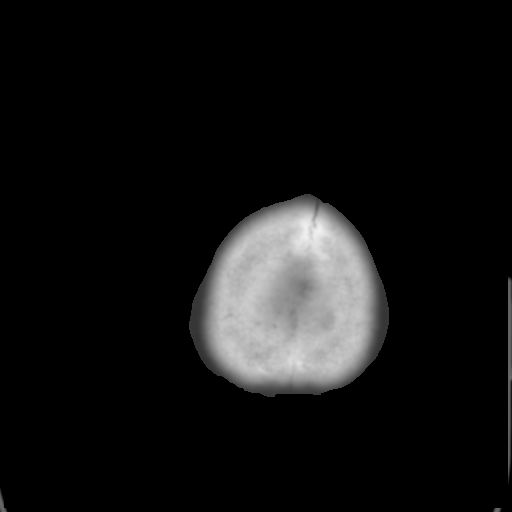

[Series 5: head 3.0 mpr cor · coronal · 0.31mm/px · 3 of 63 slices shown]
[im 21/63  brain]
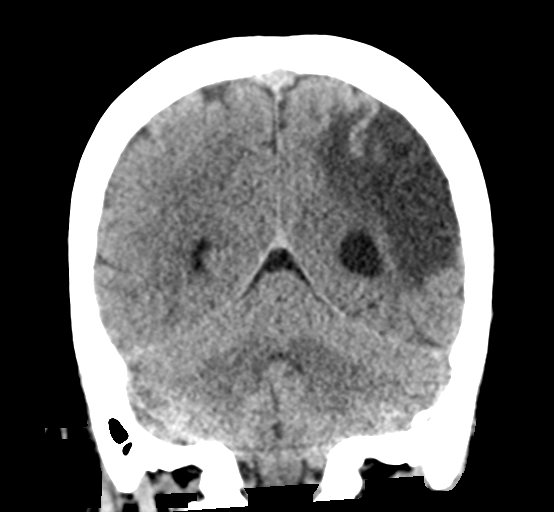
[im 28/63  brain]
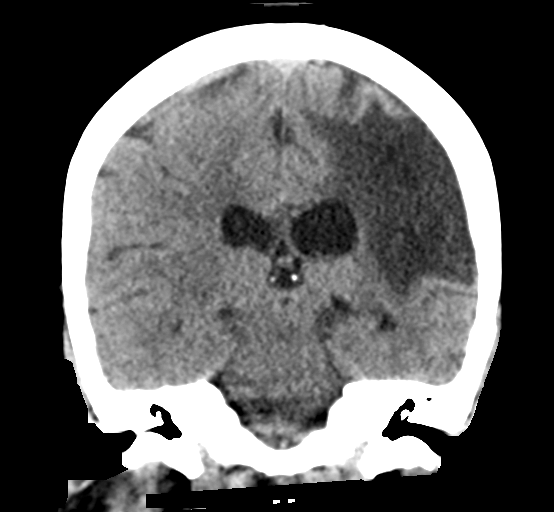
[im 35/63  brain]
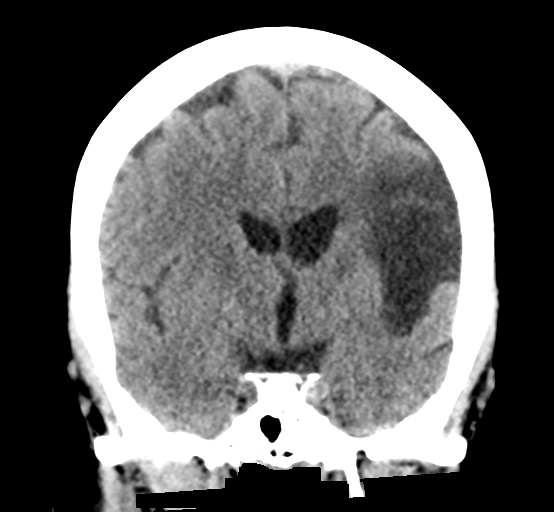

[Series 6: head 3.0 mpr sag · sagittal · 0.31mm/px · 3 of 52 slices shown]
[im 18/52  brain]
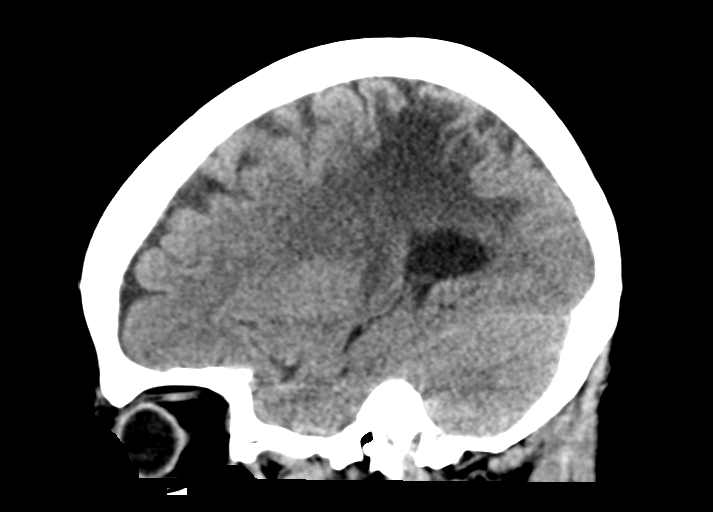
[im 26/52  brain]
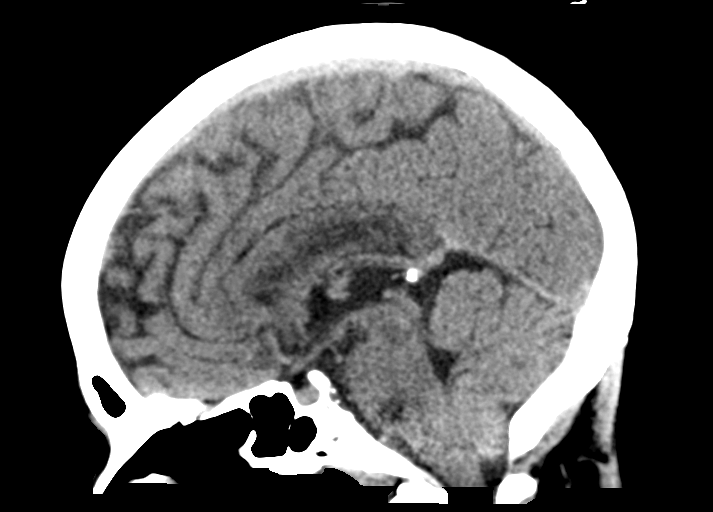
[im 35/52  brain]
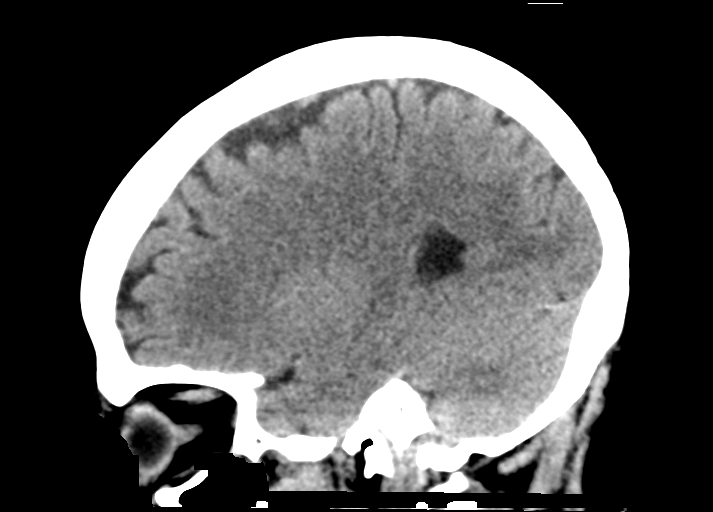

[15 of 47 positions shown; findings below may reference images not displayed]

FINDINGS: Brain: Chronic encephalomalacia in the left MCA territory, middle
and posterior MCA division with mild ex vacuo enlargement of the
left lateral ventricle. Stable gray-white matter differentiation
throughout the brain since 1309.

No midline shift, ventriculomegaly, mass effect, evidence of mass
lesion, intracranial hemorrhage or evidence of cortically based
acute infarction. Mild vascular calcifications in the bilateral
basal ganglia.

Vascular: Mild Calcified atherosclerosis at the skull base. No
suspicious intracranial vascular hyperdensity.

Skull: No acute osseous abnormality identified.

Sinuses/Orbits: Visualized paranasal sinuses and mastoids are stable
and well aerated.

Other: Disconjugate gaze, otherwise negative orbit and scalp soft
tissues.
IMPRESSION: No acute intracranial abnormality.

Stable non contrast CT appearance of the brain since 1309 with
chronic left MCA infarct.

## 2023-04-04 IMAGING — DX DG HAND COMPLETE 3+V*L*
3 series · 3 of 3 positions shown · non-contrast
Comparison: Report of left wrist series 06/29/2001 (no images
available).

CLINICAL DATA: 65-year-old female with neurologic deficit. Left
hand and wrist pain.

EXAM:
LEFT HAND - COMPLETE 3+ VIEW

[hand pa]
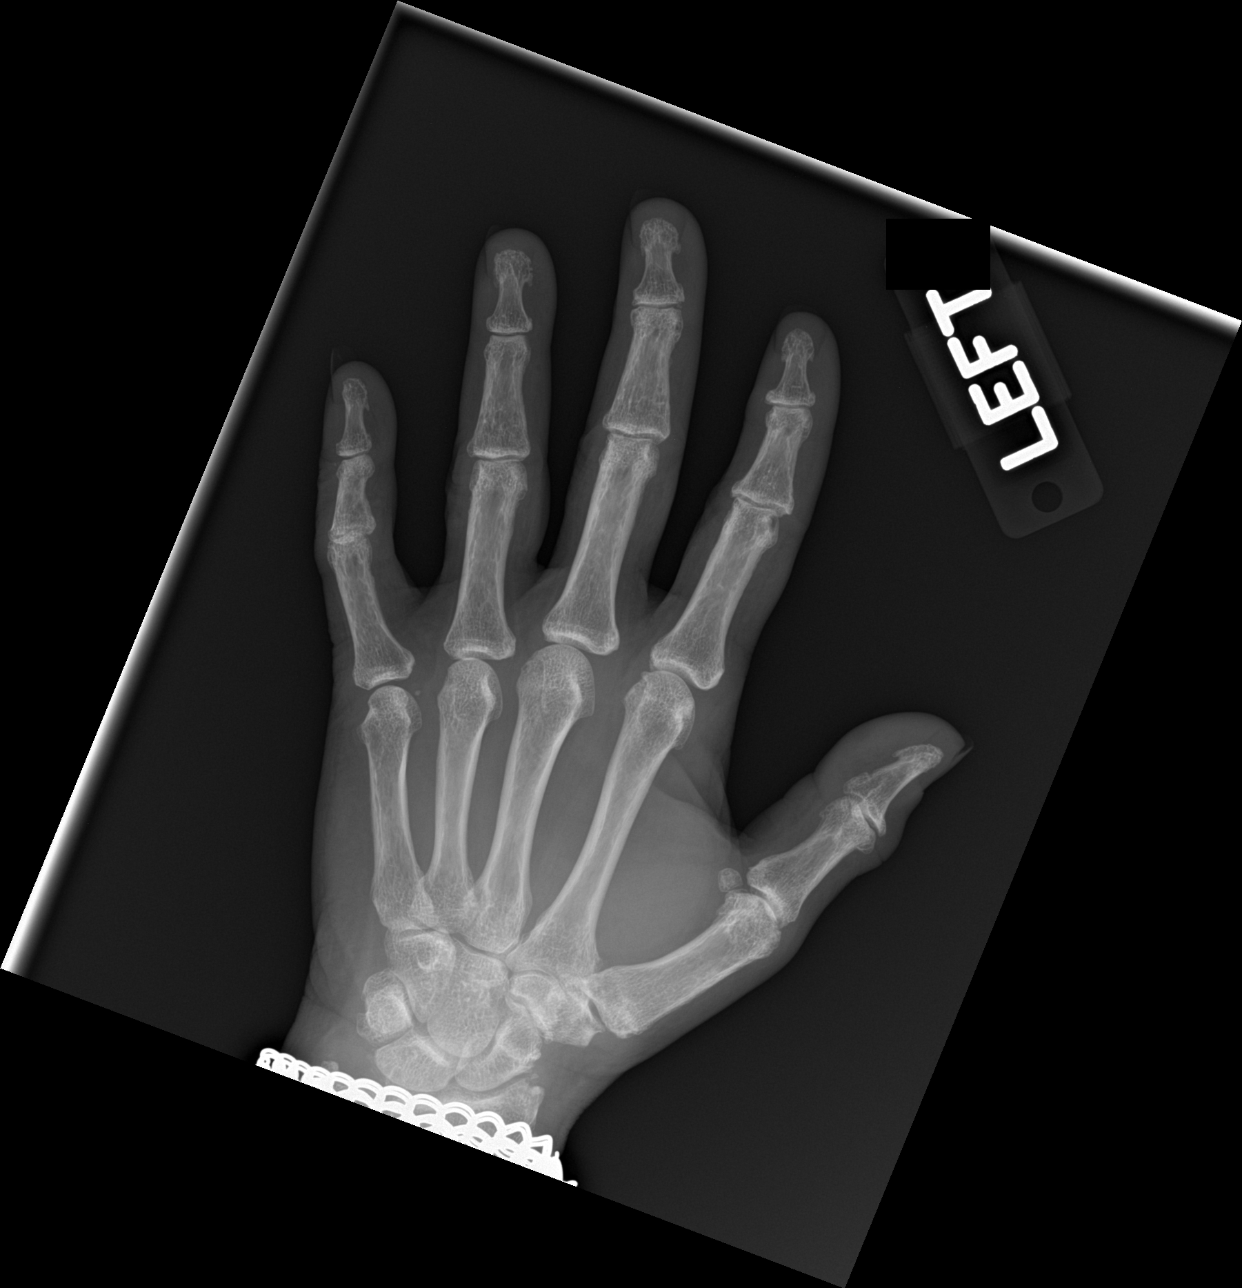

[hand obl]
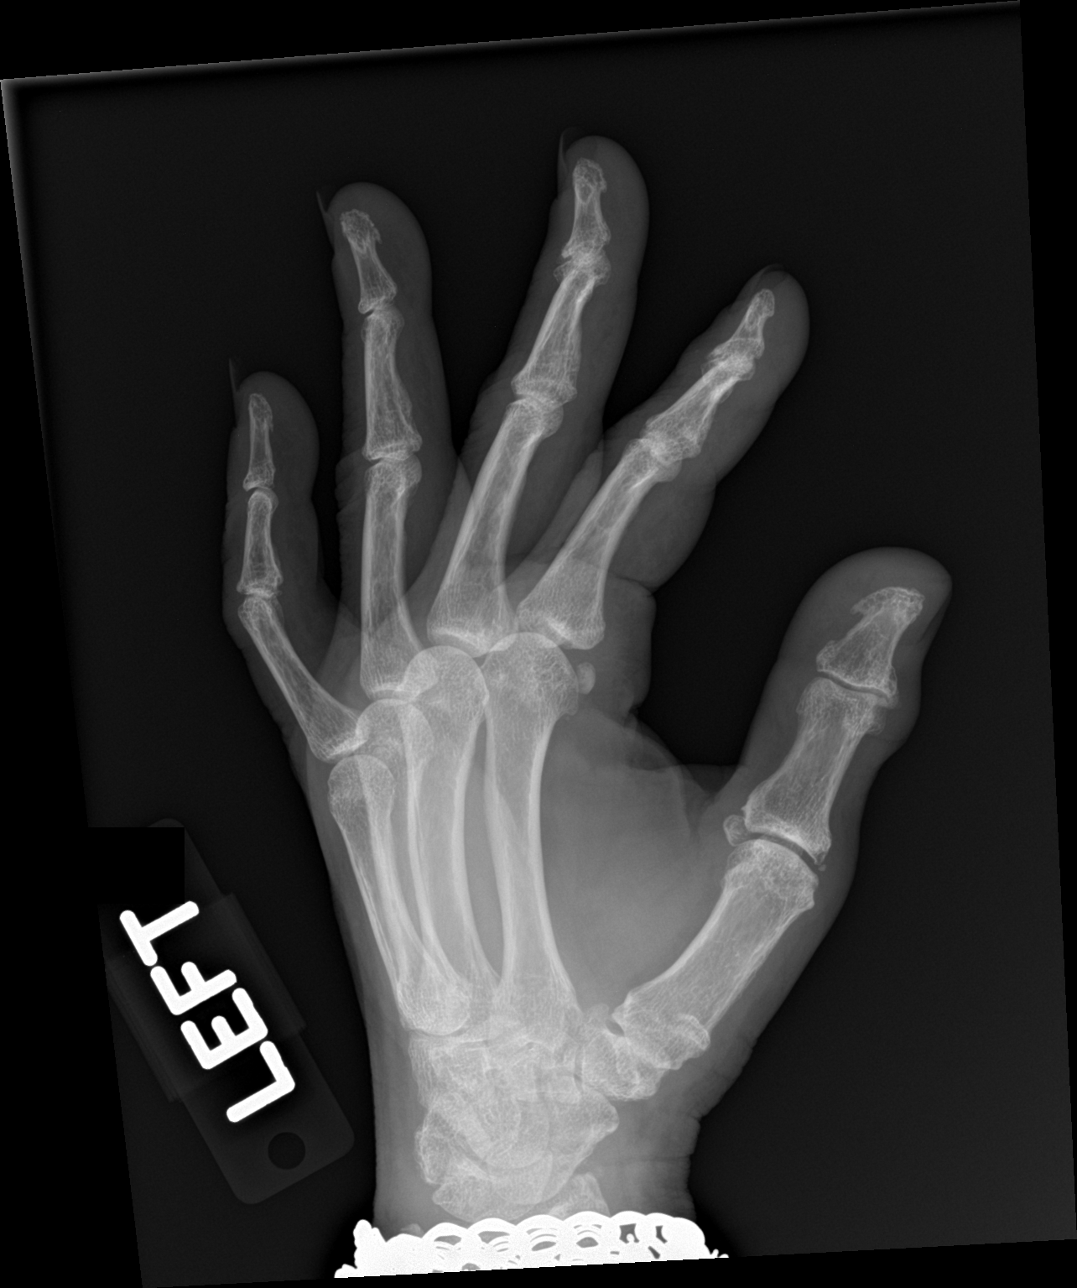

[hand lat]
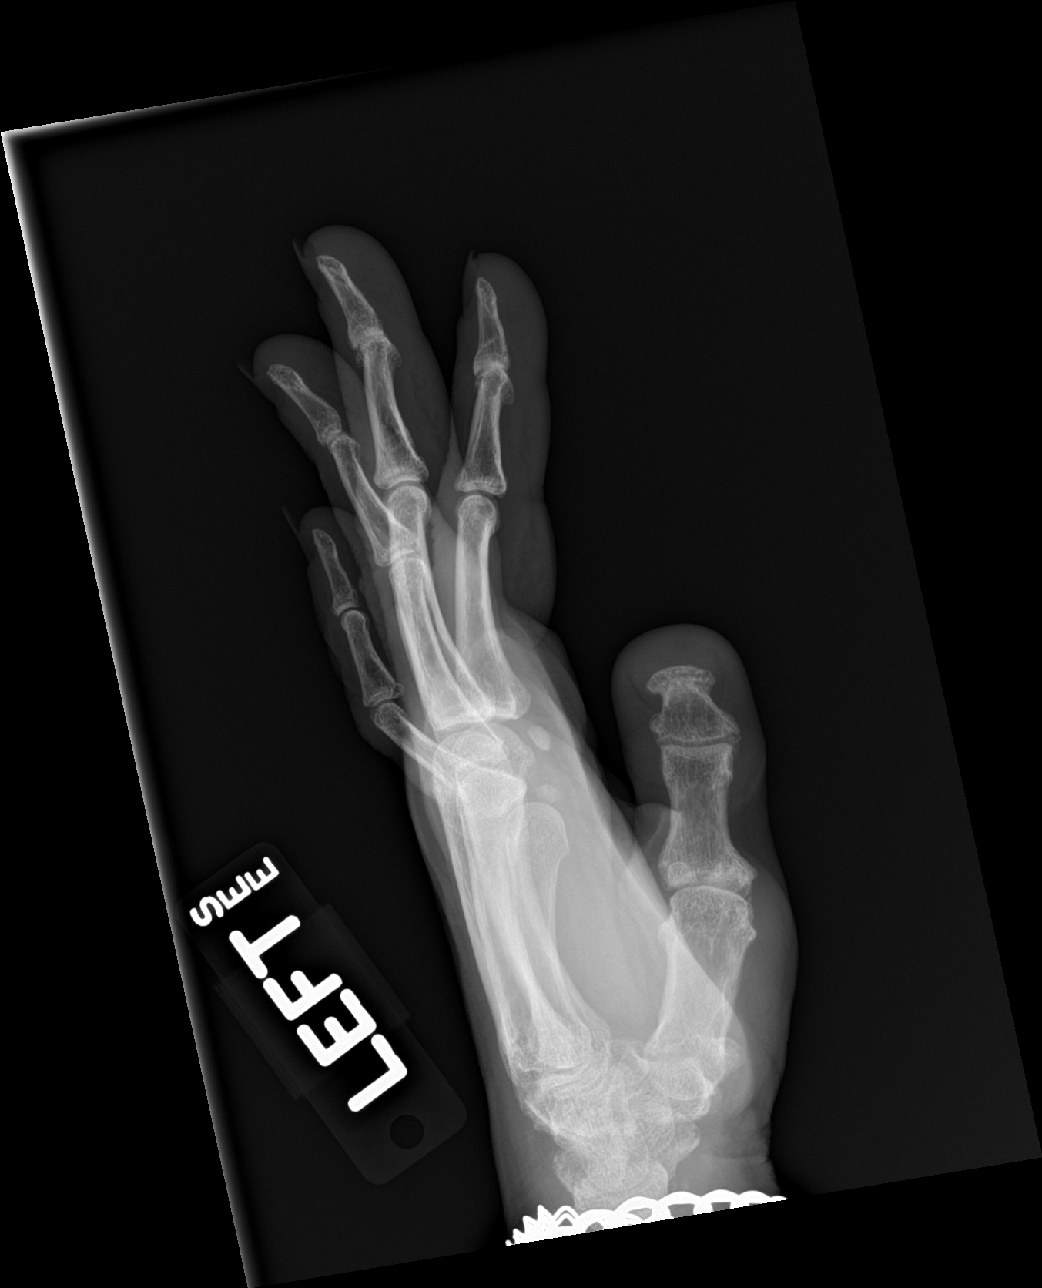

[3 of 3 positions shown; findings below may reference images not displayed]

FINDINGS: Metal bracelet artifact at the left wrist, obscuring the distal
radius and ulna. Carpal bone alignment maintained. Metacarpals and
phalanges intact. Some intermittent degenerative joint space loss in
the left hand and wrist, most pronounced at the 2nd and 3rd DIP with
typical appearance of osteoarthritis there.
IMPRESSION: 1. No acute osseous abnormality identified. Bracelet artifact
obscuring part of the wrist.
2. Osteoarthritis.

## 2023-06-09 ENCOUNTER — Emergency Department (HOSPITAL_COMMUNITY): Payer: Medicare Other

## 2023-06-09 ENCOUNTER — Encounter (HOSPITAL_COMMUNITY): Payer: Self-pay | Admitting: Emergency Medicine

## 2023-06-09 ENCOUNTER — Other Ambulatory Visit: Payer: Self-pay

## 2023-06-09 ENCOUNTER — Emergency Department (HOSPITAL_COMMUNITY)
Admission: EM | Admit: 2023-06-09 | Discharge: 2023-06-09 | Disposition: A | Discharge status: Home / Self Care | Payer: Medicare Other | Attending: Emergency Medicine | Admitting: Emergency Medicine

## 2023-06-09 DIAGNOSIS — R0602 Shortness of breath: Secondary | ICD-10-CM | POA: Diagnosis present

## 2023-06-09 DIAGNOSIS — Z20822 Contact with and (suspected) exposure to covid-19: Secondary | ICD-10-CM | POA: Diagnosis not present

## 2023-06-09 DIAGNOSIS — J441 Chronic obstructive pulmonary disease with (acute) exacerbation: Secondary | ICD-10-CM | POA: Insufficient documentation

## 2023-06-09 LAB — CBC WITH DIFFERENTIAL/PLATELET
Abs Immature Granulocytes: 0.01 10*3/uL (ref 0.00–0.07)
Basophils Absolute: 0 10*3/uL (ref 0.0–0.1)
Basophils Relative: 0 %
Eosinophils Absolute: 0.1 10*3/uL (ref 0.0–0.5)
Eosinophils Relative: 2 %
HCT: 40.8 % (ref 36.0–46.0)
Hemoglobin: 13.2 g/dL (ref 12.0–15.0)
Immature Granulocytes: 0 %
Lymphocytes Relative: 57 %
Lymphs Abs: 3.1 10*3/uL (ref 0.7–4.0)
MCH: 33.2 pg (ref 26.0–34.0)
MCHC: 32.4 g/dL (ref 30.0–36.0)
MCV: 102.5 fL — ABNORMAL HIGH (ref 80.0–100.0)
Monocytes Absolute: 0.5 10*3/uL (ref 0.1–1.0)
Monocytes Relative: 9 %
Neutro Abs: 1.7 10*3/uL (ref 1.7–7.7)
Neutrophils Relative %: 32 %
Platelets: 143 10*3/uL — ABNORMAL LOW (ref 150–400)
RBC: 3.98 MIL/uL (ref 3.87–5.11)
RDW: 14.1 % (ref 11.5–15.5)
WBC: 5.4 10*3/uL (ref 4.0–10.5)
nRBC: 0 % (ref 0.0–0.2)

## 2023-06-09 LAB — BASIC METABOLIC PANEL
Anion gap: 8 (ref 5–15)
BUN: 8 mg/dL (ref 8–23)
CO2: 21 mmol/L — ABNORMAL LOW (ref 22–32)
Calcium: 7 mg/dL — ABNORMAL LOW (ref 8.9–10.3)
Chloride: 104 mmol/L (ref 98–111)
Creatinine, Ser: 0.69 mg/dL (ref 0.44–1.00)
GFR, Estimated: >60 mL/min (ref >60)
Glucose, Bld: 95 mg/dL (ref 70–99)
Potassium: 3.7 mmol/L (ref 3.5–5.1)
Sodium: 133 mmol/L — ABNORMAL LOW (ref 135–145)

## 2023-06-09 LAB — RESP PANEL BY RT-PCR (RSV, FLU A&B, COVID)  RVPGX2
Influenza A by PCR: NEGATIVE
Influenza B by PCR: NEGATIVE
Resp Syncytial Virus by PCR: NEGATIVE
SARS Coronavirus 2 by RT PCR: NEGATIVE

## 2023-06-09 MED ORDER — MORPHINE SULFATE (PF) 4 MG/ML IV SOLN
4.0000 mg | Freq: Once | INTRAVENOUS | Status: AC
  Administered 2023-06-09: 4 mg via INTRAVENOUS
  Filled 2023-06-09: qty 1

## 2023-06-09 MED ORDER — DOXYCYCLINE HYCLATE 100 MG PO CAPS: 100.0000 mg | ORAL_CAPSULE | Freq: Two times a day (BID) | ORAL | 0 refills | Status: AC

## 2023-06-09 MED ORDER — OXYCODONE-ACETAMINOPHEN 5-325 MG PO TABS
1.0000 | ORAL_TABLET | Freq: Once | ORAL | Status: AC
  Administered 2023-06-09: 1 via ORAL
  Filled 2023-06-09: qty 1

## 2023-06-09 MED ORDER — PREDNISONE 20 MG PO TABS: 40.0000 mg | ORAL_TABLET | Freq: Every day | ORAL | 0 refills | Status: AC

## 2023-06-09 MED ORDER — ALBUTEROL SULFATE (2.5 MG/3ML) 0.083% IN NEBU: 2.5000 mg | INHALATION_SOLUTION | RESPIRATORY_TRACT | 3 refills | Status: AC | PRN

## 2023-06-09 MED ORDER — ONDANSETRON HCL 4 MG/2ML IJ SOLN
4.0000 mg | Freq: Once | INTRAMUSCULAR | Status: AC
  Administered 2023-06-09: 4 mg via INTRAVENOUS
  Filled 2023-06-09: qty 2

## 2023-06-09 MED ORDER — ALBUTEROL SULFATE HFA 108 (90 BASE) MCG/ACT IN AERS: 2.0000 | INHALATION_SPRAY | RESPIRATORY_TRACT | 2 refills | Status: AC | PRN

## 2023-06-09 NOTE — ED Provider Notes (Signed)
 [place] EMERGENCY DEPARTMENT AT [facility] Provider Note   CSN: [id number] Arrival date & time: 06/09/23  0017     History  Chief Complaint  Patient presents with   Shortness of Breath    Brooke [name] is a 67 y.o. female.  Patient brought to the emergency department by EMS from home.  Patient reports a history of COPD but has been out of her albuterol .  She has had shortness of breath for a week, symptoms worsened tonight.  Patient does report increased cough.  She has not had a fever or chest pain.  She does report low back pain which is chronic in nature for her.  No change.  Patient received Solu-Medrol  125 mg IV and a DuoNeb during transport.       Home Medications Prior to Admission medications   Medication Sig Start Date End Date Taking? Authorizing Provider  albuterol  (PROVENTIL ) (2.5 MG/3ML) 0.083% nebulizer solution Take 3 mLs (2.5 mg total) by nebulization every 4 (four) hours as needed for wheezing or shortness of breath. 06/09/23  Yes Yair Dusza, Lonni JINNY, MD  albuterol  (VENTOLIN  HFA) 108 (90 Base) MCG/ACT inhaler Inhale 2 puffs into the lungs every 4 (four) hours as needed for wheezing or shortness of breath. 06/09/23  Yes Paco Cislo, Lonni JINNY, MD  doxycycline  (VIBRAMYCIN ) 100 MG capsule Take 1 capsule (100 mg total) by mouth 2 (two) times daily. 06/09/23  Yes Alveena Taira, Lonni JINNY, MD  predniSONE  (DELTASONE ) 20 MG tablet Take 2 tablets (40 mg total) by mouth daily with breakfast. 06/09/23  Yes Texanna Hilburn, Lonni JINNY, MD  acetaminophen  (TYLENOL ) 500 MG tablet Take 1,000 mg by mouth every 8 (eight) hours as needed for moderate pain.  10/20/17   [provider]  albuterol  (PROVENTIL  HFA;VENTOLIN  HFA) 108 (90 Base) MCG/ACT inhaler Inhale 2 puffs into the lungs every 6 (six) hours as needed for wheezing or shortness of breath (shortness of breath). 10/29/15   Newlin, Enobong, MD  albuterol  (PROVENTIL ) (2.5 MG/3ML) 0.083% nebulizer solution  Take 3 mLs (2.5 mg total) by nebulization every 6 (six) hours as needed for wheezing or shortness of breath. 11/10/17   Kirichenko, Tatyana, PA-C  amitriptyline  (ELAVIL ) 50 MG tablet Take 50 mg by mouth at bedtime.    [provider]  atorvastatin  (LIPITOR) 40 MG tablet TAKE 1 TABLET DAILY AT 6 PM 01/22/16   Newlin, Enobong, MD  Cholecalciferol (VITAMIN D3) 5000 units CAPS Take 5,000 Units by mouth daily. 12/29/17   [provider]  gabapentin  (NEURONTIN ) 600 MG tablet Take 600 mg by mouth 2 (two) times daily.  09/29/17   [provider]  hydrochlorothiazide  (HYDRODIURIL ) 12.5 MG tablet Take 12.5 mg by mouth daily. 02/24/17   [provider]  HYDROcodone -acetaminophen  (NORCO/VICODIN) 5-325 MG tablet Take 1 tablet by mouth every 6 (six) hours as needed for severe pain. 01/04/18   Garrick Charleston, MD  levETIRAcetam  (KEPPRA ) 500 MG tablet TAKE 1 TABLET TWICE DAILY 01/22/16   Newlin, Enobong, MD  omeprazole  (PRILOSEC) 40 MG capsule TAKE 1 CAPSULE EVERY DAY 09/17/15   Newlin, Enobong, MD  oxyCODONE -acetaminophen  (PERCOCET) 7.5-325 MG tablet Take 1 tablet by mouth every 8 (eight) hours as needed for severe pain.    [provider]  oxyCODONE -acetaminophen  (PERCOCET/ROXICET) 5-325 MG tablet Take 1 tablet by mouth every 4 (four) hours as needed for severe pain. 07/24/21   Tegeler, Lonni JINNY, MD  rivaroxaban  (XARELTO ) 20 MG TABS tablet TAKE ONE TABLET BY MOUTH ONCE DAILY IN THE EVENING WITH  SUPPER 10/29/15   Newlin, Enobong, MD      Allergies    Kiwi extract and Aspirin     Review of Systems   Review of Systems  Physical Exam Updated Vital Signs BP (!) 143/97 (BP Location: Right Wrist)   Pulse 86   Temp (!) 97.4 F (36.3 C) (Oral)   Resp 20   LMP 05/24/2003   SpO2 100%  Physical Exam Vitals and nursing note reviewed.  Constitutional:      General: She is not in acute distress.    Appearance: She is well-developed.  HENT:     Head: Normocephalic and  atraumatic.     Mouth/Throat:     Mouth: Mucous membranes are moist.  Eyes:     General: Vision grossly intact. Gaze aligned appropriately.     Extraocular Movements: Extraocular movements intact.     Conjunctiva/sclera: Conjunctivae normal.  Cardiovascular:     Rate and Rhythm: Normal rate and regular rhythm.     Pulses: Normal pulses.     Heart sounds: Normal heart sounds, S1 normal and S2 normal. No murmur heard.    No friction rub. No gallop.  Pulmonary:     Effort: Pulmonary effort is normal. No respiratory distress.     Breath sounds: Examination of the right-upper field reveals wheezing. Examination of the right-middle field reveals wheezing. Wheezing present.  Abdominal:     General: Bowel sounds are normal.     Palpations: Abdomen is soft.     Tenderness: There is no abdominal tenderness. There is no guarding or rebound.     Hernia: No hernia is present.  Musculoskeletal:        General: No swelling.     Cervical back: Full passive range of motion without pain, normal range of motion and neck supple. No spinous process tenderness or muscular tenderness. Normal range of motion.     Right lower leg: No edema.     Left lower leg: No edema.  Skin:    General: Skin is warm and dry.     Capillary Refill: Capillary refill takes less than 2 seconds.     Findings: No ecchymosis, erythema, rash or wound.  Neurological:     General: No focal deficit present.     Mental Status: She is alert and oriented to person, place, and time.     GCS: GCS eye subscore is 4. GCS verbal subscore is 5. GCS motor subscore is 6.     Cranial Nerves: Cranial nerves 2-12 are intact.     Sensory: Sensation is intact.     Motor: Motor function is intact.     Coordination: Coordination is intact.  Psychiatric:        Attention and Perception: Attention normal.        Mood and Affect: Mood normal.        Speech: Speech normal.        Behavior: Behavior normal.     ED Results / Procedures /  Treatments   Labs (all labs ordered are listed, but only abnormal results are displayed) Labs Reviewed  CBC WITH DIFFERENTIAL/PLATELET - Abnormal; Notable for the following components:      Result Value   MCV 102.5 (*)    Platelets 143 (*)    All other components within normal limits  BASIC METABOLIC PANEL - Abnormal; Notable for the following components:   Sodium 133 (*)    CO2 21 (*)    Calcium  7.0 (*)    All  other components within normal limits  RESP PANEL BY RT-PCR (RSV, FLU A&B, COVID)  [id number]    EKG None  Radiology DG Chest Port 1 View Result Date: 06/09/2023 CLINICAL DATA:  Increasing shortness of breath EXAM: PORTABLE CHEST 1 VIEW COMPARISON:  01/04/2018 FINDINGS: Cardiac shadow is enlarged but stable. Lungs are well aerated bilaterally. No focal infiltrate or effusion is seen. No bony abnormality is noted. IMPRESSION: No active disease. Electronically Signed   By: Oneil Devonshire M.D.   On: 06/09/2023 00:52    Procedures Procedures    Medications Ordered in ED Medications  oxyCODONE -acetaminophen  (PERCOCET/ROXICET) 5-325 MG per tablet 1 tablet (1 tablet Oral Given 06/09/23 9547)    ED Course/ Medical Decision Making/ A&P                                 Medical Decision Making Amount and/or Complexity of Data Reviewed Labs: ordered. Radiology: ordered.  Risk Prescription drug management.   Differential Diagnosis considered includes, but not limited to: COPD exacerbation; Bronchitis; Pneumonia; CHF; ACS; PE  Presents to the emergency department with complaints of shortness of breath.  Patient reports that she does have a nebulizer machine but did not have any albuterol .  She comes to the ED by ambulance.  Patient administered Solu-Medrol  and DuoNeb during transport.  Patient with slight wheezing at the top of the right lung at arrival but no respiratory distress.  Lab work, chest x-ray unremarkable.  COVID-negative, influenza negative, RSV  negative.  Patient complaining of pain in her lower back which is chronic.  No red flags.  No saddle anesthesia, no foot drop, no signs of acute neurosurgical emergency.        Final Clinical Impression(s) / ED Diagnoses Final diagnoses:  COPD exacerbation (HCC)    Rx / DC Orders ED Discharge Orders          Ordered    predniSONE  (DELTASONE ) 20 MG tablet  Daily with breakfast        06/09/23 0626    albuterol  (PROVENTIL ) (2.5 MG/3ML) 0.083% nebulizer solution  Every 4 hours PRN        06/09/23 0626    albuterol  (VENTOLIN  HFA) 108 (90 Base) MCG/ACT inhaler  Every 4 hours PRN        06/09/23 0626    doxycycline  (VIBRAMYCIN ) 100 MG capsule  2 times daily        06/09/23 0626              Haze Lonni PARAS, MD 06/09/23 [phone]

## 2023-06-09 NOTE — ED Triage Notes (Signed)
Patient BIB EMS from home c/o SOB x1 week.  Patient report worsening SOB tonight. Patient report out of Albuterol inhaler at home. Patient had Duoneb, Solumedrol 125mg  IV by EMS. Patient hx of COPD, current smoker,

## 2023-12-05 ENCOUNTER — Emergency Department (HOSPITAL_COMMUNITY)

## 2023-12-05 ENCOUNTER — Encounter (HOSPITAL_COMMUNITY): Payer: Self-pay | Admitting: Emergency Medicine

## 2023-12-05 ENCOUNTER — Emergency Department (HOSPITAL_COMMUNITY)
Admission: EM | Admit: 2023-12-05 | Discharge: 2023-12-06 | Disposition: A | Discharge status: Home / Self Care | Attending: Student | Admitting: Student

## 2023-12-05 DIAGNOSIS — R410 Disorientation, unspecified: Secondary | ICD-10-CM | POA: Diagnosis not present

## 2023-12-05 DIAGNOSIS — J449 Chronic obstructive pulmonary disease, unspecified: Secondary | ICD-10-CM | POA: Diagnosis not present

## 2023-12-05 DIAGNOSIS — Z7901 Long term (current) use of anticoagulants: Secondary | ICD-10-CM | POA: Diagnosis not present

## 2023-12-05 DIAGNOSIS — R1032 Left lower quadrant pain: Secondary | ICD-10-CM | POA: Diagnosis not present

## 2023-12-05 DIAGNOSIS — Z8673 Personal history of transient ischemic attack (TIA), and cerebral infarction without residual deficits: Secondary | ICD-10-CM | POA: Insufficient documentation

## 2023-12-05 DIAGNOSIS — Z85038 Personal history of other malignant neoplasm of large intestine: Secondary | ICD-10-CM | POA: Diagnosis not present

## 2023-12-05 DIAGNOSIS — R531 Weakness: Secondary | ICD-10-CM | POA: Insufficient documentation

## 2023-12-05 DIAGNOSIS — R4182 Altered mental status, unspecified: Secondary | ICD-10-CM | POA: Diagnosis present

## 2023-12-05 DIAGNOSIS — E876 Hypokalemia: Secondary | ICD-10-CM | POA: Insufficient documentation

## 2023-12-05 DIAGNOSIS — M4802 Spinal stenosis, cervical region: Secondary | ICD-10-CM

## 2023-12-05 LAB — CBC WITH DIFFERENTIAL/PLATELET
Abs Immature Granulocytes: 0.01 10*3/uL (ref 0.00–0.07)
Basophils Absolute: 0 10*3/uL (ref 0.0–0.1)
Basophils Relative: 0 %
Eosinophils Absolute: 0.1 10*3/uL (ref 0.0–0.5)
Eosinophils Relative: 2 %
HCT: 45.5 % (ref 36.0–46.0)
Hemoglobin: 14.7 g/dL (ref 12.0–15.0)
Immature Granulocytes: 0 %
Lymphocytes Relative: 48 %
Lymphs Abs: 2.4 10*3/uL (ref 0.7–4.0)
MCH: 33.1 pg (ref 26.0–34.0)
MCHC: 32.3 g/dL (ref 30.0–36.0)
MCV: 102.5 fL — ABNORMAL HIGH (ref 80.0–100.0)
Monocytes Absolute: 0.4 10*3/uL (ref 0.1–1.0)
Monocytes Relative: 9 %
Neutro Abs: 2.1 10*3/uL (ref 1.7–7.7)
Neutrophils Relative %: 41 %
Platelets: 164 10*3/uL (ref 150–400)
RBC: 4.44 MIL/uL (ref 3.87–5.11)
RDW: 13.6 % (ref 11.5–15.5)
WBC: 5.1 10*3/uL (ref 4.0–10.5)
nRBC: 0 % (ref 0.0–0.2)

## 2023-12-05 LAB — COMPREHENSIVE METABOLIC PANEL WITH GFR
ALT: 8 U/L (ref 0–44)
AST: 16 U/L (ref 15–41)
Albumin: 4.1 g/dL (ref 3.5–5.0)
Alkaline Phosphatase: 74 U/L (ref 38–126)
Anion gap: 12 (ref 5–15)
BUN: 8 mg/dL (ref 8–23)
CO2: 29 mmol/L (ref 22–32)
Calcium: 9.1 mg/dL (ref 8.9–10.3)
Chloride: 100 mmol/L (ref 98–111)
Creatinine, Ser: 0.53 mg/dL (ref 0.44–1.00)
GFR, Estimated: >60 mL/min (ref >60)
Glucose, Bld: 85 mg/dL (ref 70–99)
Potassium: 3 mmol/L — ABNORMAL LOW (ref 3.5–5.1)
Sodium: 141 mmol/L (ref 135–145)
Total Bilirubin: 0.6 mg/dL (ref 0.0–1.2)
Total Protein: 7.9 g/dL (ref 6.5–8.1)

## 2023-12-05 LAB — I-STAT CHEM 8, ED
BUN: 8 mg/dL (ref 8–23)
Calcium, Ion: 1.02 mmol/L — ABNORMAL LOW (ref 1.15–1.40)
Chloride: 101 mmol/L (ref 98–111)
Creatinine, Ser: 0.7 mg/dL (ref 0.44–1.00)
Glucose, Bld: 84 mg/dL (ref 70–99)
HCT: 44 % (ref 36.0–46.0)
Hemoglobin: 15 g/dL (ref 12.0–15.0)
Potassium: 3.2 mmol/L — ABNORMAL LOW (ref 3.5–5.1)
Sodium: 139 mmol/L (ref 135–145)
TCO2: 29 mmol/L (ref 22–32)

## 2023-12-05 LAB — RAPID URINE DRUG SCREEN, HOSP PERFORMED
Amphetamines: NOT DETECTED
Barbiturates: NOT DETECTED
Benzodiazepines: NOT DETECTED
Cocaine: NOT DETECTED
Opiates: POSITIVE — AB
Tetrahydrocannabinol: NOT DETECTED

## 2023-12-05 NOTE — ED Notes (Signed)
 Patient went to CT

## 2023-12-05 NOTE — ED Triage Notes (Addendum)
 PT from home called son and told him her hands were hurting. her son was worried bc of hx of stroke so and called 911.  Bilateral hand and arm burning sensation that comes and goes. Started last night. Ems reports she was wheezing when they got to her house. Hx of COPD. She is out of meds for breathing tx; RA was 94%. EMS gave her a breathing treatment. Lung sounds clear and sating 100%. VSS.

## 2023-12-05 NOTE — ED Provider Notes (Incomplete)
 [place] EMERGENCY DEPARTMENT AT [facility] Provider Note   CSN: [id number] Arrival date & time: 12/05/23  2049     Patient presents with: Hand Pain   Brooke [name] is a 68 y.o. female.  {Add pertinent medical, surgical, social history, OB history to HPI:32947} HPI     Prior to Admission medications   Medication Sig Start Date End Date Taking? Authorizing Provider  acetaminophen  (TYLENOL ) 500 MG tablet Take 1,000 mg by mouth every 8 (eight) hours as needed for moderate pain.  10/20/17   [provider]  albuterol  (PROVENTIL  HFA;VENTOLIN  HFA) 108 (90 Base) MCG/ACT inhaler Inhale 2 puffs into the lungs every 6 (six) hours as needed for wheezing or shortness of breath (shortness of breath). 10/29/15   Newlin, Enobong, MD  albuterol  (PROVENTIL ) (2.5 MG/3ML) 0.083% nebulizer solution Take 3 mLs (2.5 mg total) by nebulization every 6 (six) hours as needed for wheezing or shortness of breath. 11/10/17   Kirichenko, Tatyana, PA-C  albuterol  (PROVENTIL ) (2.5 MG/3ML) 0.083% nebulizer solution Take 3 mLs (2.5 mg total) by nebulization every 4 (four) hours as needed for wheezing or shortness of breath. 06/09/23   Haze Lonni JINNY, MD  albuterol  (VENTOLIN  HFA) 108 (90 Base) MCG/ACT inhaler Inhale 2 puffs into the lungs every 4 (four) hours as needed for wheezing or shortness of breath. 06/09/23   Haze Lonni JINNY, MD  amitriptyline  (ELAVIL ) 50 MG tablet Take 50 mg by mouth at bedtime.    [provider]  atorvastatin  (LIPITOR) 40 MG tablet TAKE 1 TABLET DAILY AT 6 PM 01/22/16   Newlin, Enobong, MD  Cholecalciferol (VITAMIN D3) 5000 units CAPS Take 5,000 Units by mouth daily. 12/29/17   [provider]  doxycycline  (VIBRAMYCIN ) 100 MG capsule Take 1 capsule (100 mg total) by mouth 2 (two) times daily. 06/09/23   Haze Lonni JINNY, MD  gabapentin  (NEURONTIN ) 600 MG tablet Take 600 mg by mouth 2 (two) times daily.  09/29/17   [provider]   hydrochlorothiazide  (HYDRODIURIL ) 12.5 MG tablet Take 12.5 mg by mouth daily. 02/24/17   [provider]  HYDROcodone -acetaminophen  (NORCO/VICODIN) 5-325 MG tablet Take 1 tablet by mouth every 6 (six) hours as needed for severe pain. 01/04/18   Garrick Charleston, MD  levETIRAcetam  (KEPPRA ) 500 MG tablet TAKE 1 TABLET TWICE DAILY 01/22/16   Newlin, Enobong, MD  omeprazole  (PRILOSEC) 40 MG capsule TAKE 1 CAPSULE EVERY DAY 09/17/15   Newlin, Enobong, MD  oxyCODONE -acetaminophen  (PERCOCET) 7.5-325 MG tablet Take 1 tablet by mouth every 8 (eight) hours as needed for severe pain.    [provider]  oxyCODONE -acetaminophen  (PERCOCET/ROXICET) 5-325 MG tablet Take 1 tablet by mouth every 4 (four) hours as needed for severe pain. 07/24/21   Tegeler, Lonni JINNY, MD  predniSONE  (DELTASONE ) 20 MG tablet Take 2 tablets (40 mg total) by mouth daily with breakfast. 06/09/23   Pollina, Lonni JINNY, MD  rivaroxaban  (XARELTO ) 20 MG TABS tablet TAKE ONE TABLET BY MOUTH ONCE DAILY IN THE EVENING WITH SUPPER 10/29/15   Newlin, Enobong, MD    Allergies: Kiwi extract and Aspirin     Review of Systems  Updated Vital Signs BP (!) 158/92 (BP Location: Left Arm)   Pulse 77   Temp 98.4 F (36.9 C) (Oral)   Resp 17   LMP 05/24/2003   SpO2 98%   Physical Exam  (all labs ordered are listed, but only abnormal results are displayed) Labs Reviewed  COMPREHENSIVE METABOLIC PANEL WITH GFR - Abnormal; Notable for  the following components:      Result Value   Potassium 3.0 (*)    All other components within normal limits  CBC WITH DIFFERENTIAL/PLATELET - Abnormal; Notable for the following components:   MCV 102.5 (*)    All other components within normal limits    EKG: None  Radiology: No results found.  {Document cardiac monitor, telemetry assessment procedure when appropriate:32947} Procedures   Medications Ordered in the ED - No data to display    {Click here for ABCD2, HEART and other  calculators REFRESH Note before signing:1}                              Medical Decision Making Amount and/or Complexity of Data Reviewed Labs: ordered.   ***  {Document critical care time when appropriate  Document review of labs and clinical decision tools ie [id number], etc  Document your independent review of radiology images and any outside records  Document your discussion with family members, caretakers and with consultants  Document social determinants of health affecting pt's care  Document your decision making why or why not admission, treatments were needed:32947:::1}   Final diagnoses:  None    ED Discharge Orders     None

## 2023-12-05 NOTE — ED Notes (Signed)
 Attempted at IV 2x unsuccessful. Reached out to another RN for assistance.

## 2023-12-06 ENCOUNTER — Emergency Department (HOSPITAL_COMMUNITY)

## 2023-12-06 LAB — URINALYSIS, ROUTINE W REFLEX MICROSCOPIC
Bilirubin Urine: NEGATIVE
Glucose, UA: NEGATIVE mg/dL
Hgb urine dipstick: NEGATIVE
Ketones, ur: NEGATIVE mg/dL
Leukocytes,Ua: NEGATIVE
Nitrite: NEGATIVE
Protein, ur: NEGATIVE mg/dL
Specific Gravity, Urine: 1.025 (ref 1.005–1.030)
pH: 6 (ref 5.0–8.0)

## 2023-12-06 LAB — ETHANOL: Alcohol, Ethyl (B): <15 mg/dL (ref <15)

## 2023-12-06 LAB — PROTIME-INR
INR: 1 (ref 0.8–1.2)
Prothrombin Time: 13.3 s (ref 11.4–15.2)

## 2023-12-06 LAB — APTT: aPTT: <22 s — ABNORMAL LOW (ref 24–36)

## 2023-12-06 LAB — LIPASE, BLOOD: Lipase: 27 U/L (ref 11–51)

## 2023-12-06 MED ORDER — IOHEXOL 300 MG/ML  SOLN
100.0000 mL | Freq: Once | INTRAMUSCULAR | Status: AC | PRN
  Administered 2023-12-06: 100 mL via INTRAVENOUS

## 2023-12-06 MED ORDER — MORPHINE SULFATE (PF) 4 MG/ML IV SOLN
4.0000 mg | Freq: Once | INTRAVENOUS | Status: AC
  Administered 2023-12-06: 4 mg via INTRAVENOUS
  Filled 2023-12-06: qty 1

## 2023-12-06 MED ORDER — LORAZEPAM 2 MG/ML IJ SOLN
0.5000 mg | Freq: Once | INTRAMUSCULAR | Status: AC | PRN
  Administered 2023-12-06: 0.5 mg via INTRAVENOUS
  Filled 2023-12-06: qty 1

## 2023-12-06 MED ORDER — FENTANYL CITRATE PF 50 MCG/ML IJ SOSY
25.0000 ug | PREFILLED_SYRINGE | Freq: Once | INTRAMUSCULAR | Status: AC
  Administered 2023-12-06: 25 ug via INTRAVENOUS
  Filled 2023-12-06: qty 1

## 2023-12-06 MED ORDER — DIAZEPAM 5 MG/ML IJ SOLN
2.5000 mg | Freq: Once | INTRAMUSCULAR | Status: AC
  Administered 2023-12-06: 2.5 mg via INTRAVENOUS
  Filled 2023-12-06: qty 2

## 2023-12-06 MED ORDER — LACTATED RINGERS IV BOLUS
1000.0000 mL | Freq: Once | INTRAVENOUS | Status: AC
  Administered 2023-12-06: 1000 mL via INTRAVENOUS

## 2023-12-06 MED ORDER — SODIUM CHLORIDE 0.9% FLUSH
10.0000 mL | INTRAVENOUS | Status: DC | PRN
  Administered 2023-12-06: 10 mL

## 2023-12-06 NOTE — ED Notes (Signed)
 Patient back from CT, assisted in wheelchair to the restroom.

## 2023-12-06 NOTE — ED Notes (Signed)
 Pt returned from MRI

## 2023-12-06 NOTE — ED Notes (Signed)
 Patient transported to CT

## 2023-12-06 NOTE — ED Provider Notes (Signed)
 [place] EMERGENCY DEPARTMENT AT [facility] Provider Note   CSN: [id number] Arrival date & time: 12/05/23  2049     Patient presents with: Hand Pain   Brooke [name] is a 68 y.o. female who presents with her family at the bedside with concern for bilateral arm pain, left greater than right, her family states that she has also been behaving abnormally for self for last 48 hours.  Her granddaughter at the bedside states that she has history of weakness in her right arm after her stroke in the past but over the past couple of days has been dropping objects with her right hand secondary to weakness which is new for the patient.  Additionally her slurred speech has some slurring at baseline but has significantly worse in the last 48 hours.  Patient intermittently vaguely complaining abdominal and back pain.  Level 5 caveat due to patient's altered mental status.  Patient with history of COPD, colon cancer in the past, PEs in the past, CVA.  She is anticoagulated on Xarelto .  HPI     Prior to Admission medications   Medication Sig Start Date End Date Taking? Authorizing Provider  acetaminophen  (TYLENOL ) 500 MG tablet Take 1,000 mg by mouth every 8 (eight) hours as needed for moderate pain.  10/20/17   [provider]  albuterol  (PROVENTIL  HFA;VENTOLIN  HFA) 108 (90 Base) MCG/ACT inhaler Inhale 2 puffs into the lungs every 6 (six) hours as needed for wheezing or shortness of breath (shortness of breath). 10/29/15   Newlin, Enobong, MD  albuterol  (PROVENTIL ) (2.5 MG/3ML) 0.083% nebulizer solution Take 3 mLs (2.5 mg total) by nebulization every 6 (six) hours as needed for wheezing or shortness of breath. 11/10/17   Kirichenko, Tatyana, PA-C  albuterol  (PROVENTIL ) (2.5 MG/3ML) 0.083% nebulizer solution Take 3 mLs (2.5 mg total) by nebulization every 4 (four) hours as needed for wheezing or shortness of breath. 06/09/23   Haze Lonni JINNY, MD  albuterol  (VENTOLIN  HFA) 108 (90  Base) MCG/ACT inhaler Inhale 2 puffs into the lungs every 4 (four) hours as needed for wheezing or shortness of breath. 06/09/23   Haze Lonni JINNY, MD  amitriptyline  (ELAVIL ) 50 MG tablet Take 50 mg by mouth at bedtime.    [provider]  atorvastatin  (LIPITOR) 40 MG tablet TAKE 1 TABLET DAILY AT 6 PM 01/22/16   Newlin, Enobong, MD  Cholecalciferol (VITAMIN D3) 5000 units CAPS Take 5,000 Units by mouth daily. 12/29/17   [provider]  doxycycline  (VIBRAMYCIN ) 100 MG capsule Take 1 capsule (100 mg total) by mouth 2 (two) times daily. 06/09/23   Haze Lonni JINNY, MD  gabapentin  (NEURONTIN ) 600 MG tablet Take 600 mg by mouth 2 (two) times daily.  09/29/17   [provider]  hydrochlorothiazide  (HYDRODIURIL ) 12.5 MG tablet Take 12.5 mg by mouth daily. 02/24/17   [provider]  HYDROcodone -acetaminophen  (NORCO/VICODIN) 5-325 MG tablet Take 1 tablet by mouth every 6 (six) hours as needed for severe pain. 01/04/18   Garrick Charleston, MD  levETIRAcetam  (KEPPRA ) 500 MG tablet TAKE 1 TABLET TWICE DAILY 01/22/16   Newlin, Enobong, MD  omeprazole  (PRILOSEC) 40 MG capsule TAKE 1 CAPSULE EVERY DAY 09/17/15   Newlin, Enobong, MD  oxyCODONE -acetaminophen  (PERCOCET) 7.5-325 MG tablet Take 1 tablet by mouth every 8 (eight) hours as needed for severe pain.    [provider]  oxyCODONE -acetaminophen  (PERCOCET/ROXICET) 5-325 MG tablet Take 1 tablet by mouth every 4 (four) hours as needed for severe pain. 07/24/21  Tegeler, Lonni PARAS, MD  predniSONE  (DELTASONE ) 20 MG tablet Take 2 tablets (40 mg total) by mouth daily with breakfast. 06/09/23   Pollina, Lonni PARAS, MD  rivaroxaban  (XARELTO ) 20 MG TABS tablet TAKE ONE TABLET BY MOUTH ONCE DAILY IN THE EVENING WITH SUPPER 10/29/15   Newlin, Enobong, MD    Allergies: Kiwi extract and Aspirin     Review of Systems  Unable to perform ROS: Mental status change  Psychiatric/Behavioral:  Positive for confusion.      Updated Vital Signs BP (!) 148/75 (BP Location: Right Arm)   Pulse 87   Temp 97.9 F (36.6 C) (Oral)   Resp 16   LMP 05/24/2003   SpO2 97%   Physical Exam Vitals and nursing note reviewed.  Constitutional:      Appearance: She is not ill-appearing or toxic-appearing.  HENT:     Head: Normocephalic and atraumatic.     Mouth/Throat:     Mouth: Mucous membranes are moist.     Pharynx: No oropharyngeal exudate or posterior oropharyngeal erythema.   Eyes:     General: Lids are normal. Vision grossly intact.        Right eye: No discharge.        Left eye: No discharge.     Extraocular Movements: Extraocular movements intact.     Conjunctiva/sclera: Conjunctivae normal.     Pupils: Pupils are equal, round, and reactive to light.    Cardiovascular:     Rate and Rhythm: Normal rate and regular rhythm.     Pulses: Normal pulses.     Heart sounds: Normal heart sounds. No murmur heard. Pulmonary:     Effort: Pulmonary effort is normal. No tachypnea, bradypnea, accessory muscle usage or respiratory distress.     Breath sounds: Normal breath sounds. No wheezing or rales.  Chest:     Chest wall: No mass, lacerations, deformity, swelling, tenderness or crepitus.  Abdominal:     General: Bowel sounds are normal. There is no distension.     Palpations: Abdomen is soft.     Tenderness: There is abdominal tenderness in the left lower quadrant. There is no right CVA tenderness, left CVA tenderness, guarding or rebound.   Musculoskeletal:        General: No deformity.     Cervical back: Normal range of motion and neck supple.     Right lower leg: No edema.     Left lower leg: No edema.  Lymphadenopathy:     Cervical: No cervical adenopathy.   Skin:    General: Skin is warm and dry.   Neurological:     Mental Status: She is confused.     GCS: GCS eye subscore is 4. GCS verbal subscore is 4. GCS motor subscore is 6.     Cranial Nerves: Cranial nerves 2-12 are intact.      Sensory: Sensation is intact.     Motor: Weakness and pronator drift present.     Coordination: Finger-Nose-Finger Test abnormal.     Comments: R pronator drift and 2/5 strength on the R, 4/5 on the left. Gait deferred for patient safety.   Psychiatric:        Mood and Affect: Mood normal.   CBC unremarkable,  (all labs ordered are listed, but only abnormal results are displayed) Labs Reviewed  COMPREHENSIVE METABOLIC PANEL WITH GFR - Abnormal; Notable for the following components:      Result Value   Potassium 3.0 (*)    All other components within  normal limits  CBC WITH DIFFERENTIAL/PLATELET - Abnormal; Notable for the following components:   MCV 102.5 (*)    All other components within normal limits  APTT - Abnormal; Notable for the following components:   aPTT <22 (*)    All other components within normal limits  RAPID URINE DRUG SCREEN, HOSP PERFORMED - Abnormal; Notable for the following components:   Opiates POSITIVE (*)    All other components within normal limits  URINALYSIS, ROUTINE W REFLEX MICROSCOPIC - Abnormal; Notable for the following components:   APPearance CLOUDY (*)    All other components within normal limits  I-STAT CHEM 8, ED - Abnormal; Notable for the following components:   Potassium 3.2 (*)    Calcium , Ion 1.02 (*)    All other components within normal limits  PROTIME-INR  ETHANOL  LIPASE, BLOOD    EKG: None  Radiology: CT ABDOMEN PELVIS W CONTRAST Result Date: 12/06/2023 CLINICAL DATA:  Left lower quadrant abdominal pain. EXAM: CT ABDOMEN AND PELVIS WITH CONTRAST TECHNIQUE: Multidetector CT imaging of the abdomen and pelvis was performed using the standard protocol following bolus administration of intravenous contrast. RADIATION DOSE REDUCTION: This exam was performed according to the departmental dose-optimization program which includes automated exposure control, adjustment of the mA and/or kV according to patient size and/or use of iterative  reconstruction technique. CONTRAST:  OMNIPAQUE  IOHEXOL  300 MG/ML  SOLN COMPARISON:  CT abdomen pelvis 06/15/2014 FINDINGS: Lower chest: No acute abnormality. Hepatobiliary: Cholelithiasis. No evidence of acute cholecystitis. Unremarkable liver and biliary tree. Pancreas: Unremarkable. Spleen: Unremarkable. Adrenals/Urinary Tract: Normal adrenal glands. No urinary calculi or hydronephrosis. Unremarkable bladder. Stomach/Bowel: Postoperative changes of partial colectomy with left lower quadrant anastomosis. Normal caliber large and small bowel. Stomach is within normal limits. No bowel wall thickening. Vascular/Lymphatic: Aortic atherosclerosis. No enlarged abdominal or pelvic lymph nodes. Reproductive: Uterus and bilateral adnexa are unremarkable. Other: No free intraperitoneal fluid or air. Musculoskeletal: No acute fracture. IMPRESSION: 1. No acute abnormality in the abdomen or pelvis. 2. Cholelithiasis. 3. Aortic Atherosclerosis (ICD10-I70.0). Electronically Signed   By: Norman Gatlin M.D.   On: 12/06/2023 02:22   CT HEAD WO CONTRAST Result Date: 12/06/2023 CLINICAL DATA:  Neuro deficit, acute, stroke suspected EXAM: CT HEAD WITHOUT CONTRAST TECHNIQUE: Contiguous axial images were obtained from the base of the skull through the vertex without intravenous contrast. RADIATION DOSE REDUCTION: This exam was performed according to the departmental dose-optimization program which includes automated exposure control, adjustment of the mA and/or kV according to patient size and/or use of iterative reconstruction technique. COMPARISON:  CT head February 15, 23. FINDINGS: Brain: Unchanged remote left MCA territory infarct. No evidence of acute large vascular territory infarct, acute hemorrhage, mass lesion, midline shift or hydrocephalus. Vascular: No hyperdense vessel identified. Calcific atherosclerosis. Skull: No acute fracture. Sinuses/Orbits: Clear sinuses.  No acute orbital findings. Other: No mastoid  effusions. IMPRESSION: 1. No evidence of acute intracranial abnormality. 2. Unchanged remote left MCA territory infarct. Electronically Signed   By: Gilmore GORMAN Molt M.D.   On: 12/06/2023 00:16   DG Chest Portable 1 View Result Date: 12/05/2023 CLINICAL DATA:  Altered mental status. EXAM: PORTABLE CHEST 1 VIEW COMPARISON:  06/09/2023 FINDINGS: Stable cardiomegaly. Unchanged mediastinal contours. Mild bronchial thickening without focal airspace disease. No pleural effusion. No pneumothorax. Chronic bilateral shoulder arthropathy. IMPRESSION: Stable cardiomegaly. Mild bronchial thickening. Electronically Signed   By: Andrea Gasman M.D.   On: 12/05/2023 23:39     Procedures   Medications Ordered in the ED  sodium chloride  flush (NS) 0.9 % injection 10-40 mL (has no administration in time range)  LORazepam  (ATIVAN ) injection 0.5 mg (has no administration in time range)  fentaNYL  (SUBLIMAZE ) injection 25 mcg (25 mcg Intravenous Given 12/06/23 0125)  lactated ringers bolus 1,000 mL (0 mLs Intravenous Stopped 12/06/23 0424)  iohexol  (OMNIPAQUE ) 300 MG/ML solution 100 mL (100 mLs Intravenous Contrast Given 12/06/23 0151)  morphine  (PF) 4 MG/ML injection 4 mg (4 mg Intravenous Given 12/06/23 0220)  diazepam  (VALIUM ) injection 2.5 mg (2.5 mg Intravenous Given 12/06/23 0515)                                    Medical Decision Making 68 y/o female who presents with AMS and upper extremity parasthesias and weakness.   HTN on intake, cardioupomonary exam unremarkable, abdominal exam is with LLQ TTP, neuro exam as above.    The differential diagnosis for AMS is extensive and includes, but is not limited to:  Drug overdose - opioids, alcohol, sedatives, antipsychotics, drug withdrawal, others Metabolic: hypoxia, hypoglycemia, hyperglycemia, hypercalcemia, hypernatremia, hyponatremia, uremia, hepatic encephalopathy, hypothyroidism, hyperthyroidism, vitamin B12 or thiamine deficiency, carbon monoxide  poisoning, Wilson's disease, Lactic acidosis, DKA/HHOS Infectious: meningitis, encephalitis, bacteremia/sepsis, urinary tract infection, pneumonia, neurosyphilis Structural: Space-occupying lesion, (brain tumor, subdural hematoma, hydrocephalus,) Vascular: stroke, subarachnoid hemorrhage, coronary ischemia, hypertensive encephalopathy, CNS vasculitis, thrombotic thrombocytopenic purpura, disseminated intravascular coagulation, hyperviscosity Psychiatric: Schizophrenia, depression; Other: Seizure, hypothermia, heat stroke, ICU psychosis, dementia -sundowning.    Amount and/or Complexity of Data Reviewed Labs: ordered.    Details: CMP with hypokalemia of 3, UA unremarkable, INR is normal.  UDS positive for opiates.  Radiology: ordered.    Details:  Chest x-ray negative for acute cardiopulmonary disease.  CT head unremarkable, old left MCA stroke.  CT abdomen pelvis without acute finding.     Risk Prescription drug management.   Patient's mental status is somewhat improved though she continues to be confused, neuroexam with persistent weakness on the right side.  Patient pending MRIs at time of shift change. Care of this patient signed out to oncoming ED provider S. Upstill, PA-C at time of shift change. All pertinent HPI, physical exam, and laboratory findings were discussed with them prior to my departure. Disposition of patient pending completion of workup, reevaluation, and clinical judgement of oncoming ED provider.   This chart was dictated using voice recognition software, Dragon. Despite the best efforts of this provider to proofread and correct errors, errors may still occur which can change documentation meaning.      Final diagnoses:  None    ED Discharge Orders     None          Bobette Pleasant JONELLE DEVONNA 12/06/23 0730    Albertina Dixon, MD 12/06/23 [phone]

## 2023-12-06 NOTE — ED Notes (Signed)
 RN is obtaining USPIV

## 2023-12-06 NOTE — ED Notes (Signed)
 Pt to MRI

## 2023-12-06 NOTE — ED Provider Notes (Signed)
  Physical Exam  BP (!) 149/120 (BP Location: Right Arm)   Pulse 85   Temp 97.9 F (36.6 C) (Oral)   Resp 20   LMP 05/24/2003   SpO2 98%   Physical Exam  Procedures  Procedures  ED Course / MDM    Medical Decision Making Amount and/or Complexity of Data Reviewed Labs: ordered. Radiology: ordered.  Risk Prescription drug management.   AMS, hands burning L>R H/o CVA 2016 - has residual right weakness Granddanughter says dropping things with right hand.  ? Abdominal pian +pronator drift on right, ?baseline Is weaker above baseline on right MRI pending for acute CVA Also getting MRI c-spine due to c/o weakness, paresthesias in UE's  Potential overdose opiates - is clearing over time  Needs: Review MRI's (10:00 am today) Re-evaluate mental status, weakness  MRI cervical spine per radiology: IMPRESSION: 1. Motion degraded exam despite repeated imaging attempts. 2. 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 3. Progressed C5-C6 degeneration since 2015 with evidence of moderate spinal stenosis, mild spinal cord mass effect. No cord signal abnormality identified, although cord detail is limited. Moderate bilateral C6 foraminal stenosis.  MRI brain per radiology:  IMPRESSION: 1. No acute intracranial abnormality. 2. Chronic Left MCA infarct. Tiny chronic right cerebellar infarcts.  These results were reviewed with Dr. Mannie. On re-exam of the patient, there is slight weakness of the UE, felt c/w history of residual right weakness subsequent to stroke. No new stroke findings. She is eating and drinking in the room She has ambulated to and from the bathroom. She is felt stable for discharge home. Will refer to neurosurgery for cervical findings on MRI.       Odell Balls, PA-C 12/06/23 1031     Palumbo, April, MD 12/06/23 2300

## 2023-12-06 NOTE — ED Notes (Signed)
 Patient back from CT.

## 2023-12-06 NOTE — Discharge Instructions (Signed)
 As we discussed, you can follow up with neurosurgery to review your MRI of the neck and discuss treatment options.   Also we discussed, you would likely benefit from physical therapy for generalized strengthening and improved mobility. Discuss this with your primary care doctor.

## 2023-12-06 NOTE — ED Notes (Signed)
 Family assisted patient to the bathroom without incident. Patient returned to the bed.

## 2024-01-25 ENCOUNTER — Emergency Department (HOSPITAL_COMMUNITY): Admission: EM | Admit: 2024-01-25 | Discharge: 2024-01-25 | Disposition: A | Discharge status: Home / Self Care

## 2024-01-25 ENCOUNTER — Emergency Department (HOSPITAL_COMMUNITY)

## 2024-01-25 ENCOUNTER — Other Ambulatory Visit: Payer: Self-pay

## 2024-01-25 DIAGNOSIS — M545 Low back pain, unspecified: Secondary | ICD-10-CM | POA: Diagnosis not present

## 2024-01-25 DIAGNOSIS — M546 Pain in thoracic spine: Secondary | ICD-10-CM | POA: Insufficient documentation

## 2024-01-25 DIAGNOSIS — K0889 Other specified disorders of teeth and supporting structures: Secondary | ICD-10-CM | POA: Insufficient documentation

## 2024-01-25 DIAGNOSIS — Z7901 Long term (current) use of anticoagulants: Secondary | ICD-10-CM | POA: Insufficient documentation

## 2024-01-25 DIAGNOSIS — W06XXXA Fall from bed, initial encounter: Secondary | ICD-10-CM | POA: Diagnosis not present

## 2024-01-25 DIAGNOSIS — S0990XA Unspecified injury of head, initial encounter: Secondary | ICD-10-CM | POA: Insufficient documentation

## 2024-01-25 DIAGNOSIS — W19XXXA Unspecified fall, initial encounter: Secondary | ICD-10-CM

## 2024-01-25 MED ORDER — HYDROMORPHONE HCL 1 MG/ML IJ SOLN
1.0000 mg | Freq: Once | INTRAMUSCULAR | Status: AC
  Administered 2024-01-25: 1 mg via INTRAMUSCULAR
  Filled 2024-01-25: qty 1

## 2024-01-25 MED ORDER — OXYCODONE-ACETAMINOPHEN 5-325 MG PO TABS
1.0000 | ORAL_TABLET | Freq: Once | ORAL | Status: AC
  Administered 2024-01-25: 1 via ORAL
  Filled 2024-01-25: qty 1

## 2024-01-25 MED ORDER — CLINDAMYCIN HCL 150 MG PO CAPS: 150.0000 mg | ORAL_CAPSULE | Freq: Four times a day (QID) | ORAL | 0 refills | Status: AC

## 2024-01-25 NOTE — ED Provider Notes (Signed)
 [place] EMERGENCY DEPARTMENT AT [facility] Provider Note   CSN: [id number] Arrival date & time: 01/25/24  1358     Patient presents with: Fall and Dental Pain   Brooke [name] is a 68 y.o. female.   68 y.o female with a PMH of stroke presents to the ED via POV status post fall.  Patient reports yesterday morning she was on her bed when suddenly she rolled over from the bed striking her head she thinks on the bedroom floor.  She does not recall the incident.  She is coming in today as she is concerned for a possible infection in her mouth, she recently had a piercing done to her left upper lip area, reports that she feels that this is likely causing pus to form inside her mouth.  According to records she is anticoagulated on Xarelto , last CVA in 2017.  She is also complaining of pain along her thoracic spine, and lumbar spine.  She denies any headaches, other complaints reported.  The history is provided by the patient.  Fall Pertinent negatives include no chest pain, no abdominal pain and no shortness of breath.  Dental Pain Associated symptoms: no fever        Prior to Admission medications   Medication Sig Start Date End Date Taking? Authorizing Provider  clindamycin  (CLEOCIN ) 150 MG capsule Take 1 capsule (150 mg total) by mouth every 6 (six) hours. 01/25/24  Yes Cerinity Zynda, PA-C  acetaminophen  (TYLENOL ) 500 MG tablet Take 1,000 mg by mouth every 8 (eight) hours as needed for moderate pain.  10/20/17   [provider]  albuterol  (PROVENTIL  HFA;VENTOLIN  HFA) 108 (90 Base) MCG/ACT inhaler Inhale 2 puffs into the lungs every 6 (six) hours as needed for wheezing or shortness of breath (shortness of breath). 10/29/15   Newlin, Enobong, MD  albuterol  (PROVENTIL ) (2.5 MG/3ML) 0.083% nebulizer solution Take 3 mLs (2.5 mg total) by nebulization every 6 (six) hours as needed for wheezing or shortness of breath. 11/10/17   Kirichenko, Tatyana, PA-C  albuterol  (PROVENTIL )  (2.5 MG/3ML) 0.083% nebulizer solution Take 3 mLs (2.5 mg total) by nebulization every 4 (four) hours as needed for wheezing or shortness of breath. 06/09/23   Haze Lonni JINNY, MD  albuterol  (VENTOLIN  HFA) 108 (90 Base) MCG/ACT inhaler Inhale 2 puffs into the lungs every 4 (four) hours as needed for wheezing or shortness of breath. 06/09/23   Haze Lonni JINNY, MD  amitriptyline  (ELAVIL ) 50 MG tablet Take 50 mg by mouth at bedtime.    [provider]  atorvastatin  (LIPITOR) 40 MG tablet TAKE 1 TABLET DAILY AT 6 PM 01/22/16   Newlin, Enobong, MD  Cholecalciferol (VITAMIN D3) 5000 units CAPS Take 5,000 Units by mouth daily. 12/29/17   [provider]  doxycycline  (VIBRAMYCIN ) 100 MG capsule Take 1 capsule (100 mg total) by mouth 2 (two) times daily. 06/09/23   Haze Lonni JINNY, MD  gabapentin  (NEURONTIN ) 600 MG tablet Take 600 mg by mouth 2 (two) times daily.  09/29/17   [provider]  hydrochlorothiazide  (HYDRODIURIL ) 12.5 MG tablet Take 12.5 mg by mouth daily. 02/24/17   [provider]  HYDROcodone -acetaminophen  (NORCO/VICODIN) 5-325 MG tablet Take 1 tablet by mouth every 6 (six) hours as needed for severe pain. 01/04/18   Garrick Charleston, MD  levETIRAcetam  (KEPPRA ) 500 MG tablet TAKE 1 TABLET TWICE DAILY 01/22/16   Newlin, Enobong, MD  omeprazole  (PRILOSEC) 40 MG capsule TAKE 1 CAPSULE EVERY DAY 09/17/15   Newlin, Enobong,  MD  oxyCODONE -acetaminophen  (PERCOCET) 7.5-325 MG tablet Take 1 tablet by mouth every 8 (eight) hours as needed for severe pain.    [provider]  oxyCODONE -acetaminophen  (PERCOCET/ROXICET) 5-325 MG tablet Take 1 tablet by mouth every 4 (four) hours as needed for severe pain. 07/24/21   Tegeler, Lonni PARAS, MD  predniSONE  (DELTASONE ) 20 MG tablet Take 2 tablets (40 mg total) by mouth daily with breakfast. 06/09/23   Pollina, Lonni PARAS, MD  rivaroxaban  (XARELTO ) 20 MG TABS tablet TAKE ONE TABLET BY MOUTH ONCE DAILY  IN THE EVENING WITH SUPPER 10/29/15   Newlin, Enobong, MD    Allergies: Kiwi extract and Aspirin     Review of Systems  Constitutional:  Negative for chills and fever.  HENT:  Positive for dental problem.   Respiratory:  Negative for shortness of breath.   Cardiovascular:  Negative for chest pain.  Gastrointestinal:  Negative for abdominal pain.  Musculoskeletal:  Positive for back pain.    Updated Vital Signs BP 110/84 (BP Location: Left Arm)   Pulse 75   Temp 98.7 F (37.1 C) (Oral)   Resp 17   Ht 4' 11 (1.499 m)   Wt 88.9 kg   LMP 05/24/2003   SpO2 100%   BMI 39.59 kg/m   Physical Exam Vitals and nursing note reviewed.  Constitutional:      Appearance: Normal appearance.  HENT:     Head: Normocephalic.     Comments: No palpable goose egg, no obvious deformity.     Mouth/Throat:     Mouth: Mucous membranes are moist.  Cardiovascular:     Rate and Rhythm: Normal rate.  Pulmonary:     Effort: Pulmonary effort is normal.  Abdominal:     General: Abdomen is flat.  Musculoskeletal:     Cervical back: Normal range of motion and neck supple.  Skin:    General: Skin is warm and dry.  Neurological:     Mental Status: She is alert and oriented to person, place, and time.     Comments: Decree strength to the right upper, and right lower extremity.  Reports this is baseline for her from her prior stroke in 2017.  Moves all upper and lower extremities.  No facial asymmetry noted.     (all labs ordered are listed, but only abnormal results are displayed) Labs Reviewed - No data to display  EKG: None  Radiology: CT HEAD WO CONTRAST ( ) Result Date: 01/25/2024 CLINICAL DATA:  Fall with head and neck trauma EXAM: CT HEAD WITHOUT CONTRAST CT CERVICAL SPINE WITHOUT CONTRAST TECHNIQUE: Multidetector CT imaging of the head and cervical spine was performed following the standard protocol without intravenous contrast. Multiplanar CT image reconstructions of the cervical spine  were also generated. RADIATION DOSE REDUCTION: This exam was performed according to the departmental dose-optimization program which includes automated exposure control, adjustment of the mA and/or kV according to patient size and/or use of iterative reconstruction technique. COMPARISON:  MRI 12/06/2023, CT brain 12/05/2023, CT brain and cervical spine 03/27/2017 FINDINGS: CT HEAD FINDINGS Brain: No acute territorial infarction, hemorrhage, or intracranial mass. Chronic left MCA infarct. Stable ventricle size. Vascular: No hyperdense vessels. No unexpected calcification. Mild carotid vascular calcification Skull: Normal. Negative for fracture or focal lesion. Sinuses/Orbits: No acute finding. Other: None CT CERVICAL SPINE FINDINGS Alignment: Straightening of the cervical spine. Facet alignment is normal Skull base and vertebrae: No acute fracture. No primary bone lesion or focal pathologic process. Soft tissues and spinal canal: No prevertebral fluid  or swelling. No visible canal hematoma. Disc levels: Multilevel degenerative change. Moderate disc space narrowing C3 through C6. Left greater than right foraminal narrowing C3 through C6. Small posterior disc osteophytes at C3-C4, C4-C5. No high-grade canal stenosis. Upper chest: Negative. Other: None IMPRESSION: No CT evidence for acute intracranial abnormality. Chronic left MCA infarct. Straightening of the cervical spine with degenerative changes. No acute osseous abnormality. Electronically Signed   By: Luke Bun M.D.   On: 01/25/2024 18:35   CT Cervical Spine Wo Contrast Result Date: 01/25/2024 CLINICAL DATA:  Fall with head and neck trauma EXAM: CT HEAD WITHOUT CONTRAST CT CERVICAL SPINE WITHOUT CONTRAST TECHNIQUE: Multidetector CT imaging of the head and cervical spine was performed following the standard protocol without intravenous contrast. Multiplanar CT image reconstructions of the cervical spine were also generated. RADIATION DOSE REDUCTION: This  exam was performed according to the departmental dose-optimization program which includes automated exposure control, adjustment of the mA and/or kV according to patient size and/or use of iterative reconstruction technique. COMPARISON:  MRI 12/06/2023, CT brain 12/05/2023, CT brain and cervical spine 03/27/2017 FINDINGS: CT HEAD FINDINGS Brain: No acute territorial infarction, hemorrhage, or intracranial mass. Chronic left MCA infarct. Stable ventricle size. Vascular: No hyperdense vessels. No unexpected calcification. Mild carotid vascular calcification Skull: Normal. Negative for fracture or focal lesion. Sinuses/Orbits: No acute finding. Other: None CT CERVICAL SPINE FINDINGS Alignment: Straightening of the cervical spine. Facet alignment is normal Skull base and vertebrae: No acute fracture. No primary bone lesion or focal pathologic process. Soft tissues and spinal canal: No prevertebral fluid or swelling. No visible canal hematoma. Disc levels: Multilevel degenerative change. Moderate disc space narrowing C3 through C6. Left greater than right foraminal narrowing C3 through C6. Small posterior disc osteophytes at C3-C4, C4-C5. No high-grade canal stenosis. Upper chest: Negative. Other: None IMPRESSION: No CT evidence for acute intracranial abnormality. Chronic left MCA infarct. Straightening of the cervical spine with degenerative changes. No acute osseous abnormality. Electronically Signed   By: Luke Bun M.D.   On: 01/25/2024 18:35   DG Thoracic Spine 2 View Result Date: 01/25/2024 CLINICAL DATA:  Fall EXAM: THORACIC SPINE 2 VIEWS COMPARISON:  CT lumbar spine September 09, 2016 FINDINGS: Degenerative changes T10-T11 with diminished disc space exam, similar to exam from 2018.,. No evidence of thoracic spine fracture. Alignment is normal. Degenerative changes of upper lumbar spine. Visualized cardiomediastinal silhouette is normal. Surgical clips from prior cholecystectomy in upper abdomen. IMPRESSION:  Degenerative changes of thoracic spine predominantly at T10-T11. Electronically Signed   By: Megan  Zare M.D.   On: 01/25/2024 18:15   DG Lumbar Spine Complete Result Date: 01/25/2024 CLINICAL DATA:  Fall back injury EXAM: LUMBAR SPINE - COMPLETE 4+ VIEW COMPARISON:  CT 12/06/2023 FINDINGS: Grade 1 anterolisthesis L4 on L5. Trace retrolisthesis L2 on L3. Vertebral body heights are maintained. Multilevel degenerative change with mild disc space narrowing L2-L3, L3-L4 and L4-L5 with advanced disc space narrowing and endplate sclerosis at L5-S1. Hypertrophic facet degenerative changes of the mid lower lumbar spine. Aortic atherosclerosis. Mild chronic deformity at the sacrococcygeal region IMPRESSION: 1. No acute osseous abnormality. 2. Multilevel degenerative changes, most advanced at L5-S1. Electronically Signed   By: Luke Bun M.D.   On: 01/25/2024 18:13     Procedures   Medications Ordered in the ED  HYDROmorphone  (DILAUDID ) injection 1 mg (has no administration in time range)  oxyCODONE -acetaminophen  (PERCOCET/ROXICET) 5-325 MG per tablet 1 tablet (1 tablet Oral Given 01/25/24 1724)  Medical Decision Making Amount and/or Complexity of Data Reviewed Radiology: ordered.  Risk Prescription drug management.   Patient presents to the ED with a chief complaint of fall along with dental pain after having a piercing placed.  Patient states she rolled off her bed she is currently anticoagulated on Xarelto .  Vitals are within normal limits.  Exam is benign.  CT head, CT cervical spine without any acute findings today.  I did obtain plain films of her lumbar spine, thoracic spine as she reports pain with any type of movement.  These were also negative.  I discussed results with patient.  She did receive oxycodone  10 mg 1 tablet after reviewing PDMP, she tells me that there is no improvement in her pain.  She is also requesting for me to prescribe antibiotic for  her mouth, she does have a piercing, poor dentition throughout but no active abscess noted, no facial swelling.  Will provide her with a prescription for antibiotics.  She also received an IM injection of Dilaudid  as she had reported no improvement in her pain.  Unable to give her any Toradol  as she is anticoagulated.  She is agreeable to plan treatment, precautions discussed at length.  Patient hemodynamically stable for discharge.  Portions of this note were generated with Scientist, clinical (histocompatibility and immunogenetics). Dictation errors may occur despite best attempts at proofreading.      Final diagnoses:  Fall, initial encounter  Pain, dental    ED Discharge Orders          Ordered    clindamycin  (CLEOCIN ) 150 MG capsule  Every 6 hours        01/25/24 1941               Tahir Blank, PA-C 01/25/24 1947    Simon Lavonia SAILOR, MD 01/25/24 2258

## 2024-01-25 NOTE — ED Notes (Signed)
 Pt observed eating snack brought from home

## 2024-01-25 NOTE — ED Triage Notes (Addendum)
 Patient to ED by POV from home following a fall. She states this morning  she fell off the bad and injured her back on her stepping stool. She also c/o possible infection in mouth x2 weeks noticed pockets of pus forming on sides of mouth. She is currently on blood thinners Xarelto .

## 2024-01-25 NOTE — Discharge Instructions (Signed)
 Irregular medical nurse to help treat your likely dental infection, please take 1 tablet every 6 hours for the next 7 days.  Please return to the place who placed this piercing.  You experience worsening symptoms return to emergency department.

## 2024-04-29 ENCOUNTER — Emergency Department (HOSPITAL_COMMUNITY)
Admission: EM | Admit: 2024-04-29 | Discharge: 2024-04-29 | Disposition: A | Discharge status: Home / Self Care | Attending: Emergency Medicine | Admitting: Emergency Medicine

## 2024-04-29 ENCOUNTER — Emergency Department (HOSPITAL_COMMUNITY)

## 2024-04-29 ENCOUNTER — Other Ambulatory Visit: Payer: Self-pay

## 2024-04-29 ENCOUNTER — Encounter (HOSPITAL_COMMUNITY): Payer: Self-pay | Admitting: Emergency Medicine

## 2024-04-29 DIAGNOSIS — R0602 Shortness of breath: Secondary | ICD-10-CM | POA: Insufficient documentation

## 2024-04-29 DIAGNOSIS — M79642 Pain in left hand: Secondary | ICD-10-CM | POA: Insufficient documentation

## 2024-04-29 DIAGNOSIS — M25532 Pain in left wrist: Secondary | ICD-10-CM | POA: Diagnosis not present

## 2024-04-29 DIAGNOSIS — Z79899 Other long term (current) drug therapy: Secondary | ICD-10-CM | POA: Insufficient documentation

## 2024-04-29 DIAGNOSIS — F17200 Nicotine dependence, unspecified, uncomplicated: Secondary | ICD-10-CM | POA: Diagnosis not present

## 2024-04-29 LAB — BASIC METABOLIC PANEL WITH GFR
Anion gap: 9 (ref 5–15)
BUN: 9 mg/dL (ref 8–23)
CO2: 28 mmol/L (ref 22–32)
Calcium: 8.6 mg/dL — ABNORMAL LOW (ref 8.9–10.3)
Chloride: 104 mmol/L (ref 98–111)
Creatinine, Ser: 0.73 mg/dL (ref 0.44–1.00)
GFR, Estimated: >60 mL/min (ref >60)
Glucose, Bld: 101 mg/dL — ABNORMAL HIGH (ref 70–99)
Potassium: 4 mmol/L (ref 3.5–5.1)
Sodium: 140 mmol/L (ref 135–145)

## 2024-04-29 LAB — CBC
HCT: 38.6 % (ref 36.0–46.0)
Hemoglobin: 12.6 g/dL (ref 12.0–15.0)
MCH: 33.6 pg (ref 26.0–34.0)
MCHC: 32.6 g/dL (ref 30.0–36.0)
MCV: 102.9 fL — ABNORMAL HIGH (ref 80.0–100.0)
Platelets: 162 K/uL (ref 150–400)
RBC: 3.75 MIL/uL — ABNORMAL LOW (ref 3.87–5.11)
RDW: 13.4 % (ref 11.5–15.5)
WBC: 4.6 K/uL (ref 4.0–10.5)
nRBC: 0 % (ref 0.0–0.2)

## 2024-04-29 LAB — PRO BRAIN NATRIURETIC PEPTIDE: Pro Brain Natriuretic Peptide: 218 pg/mL (ref <300.0)

## 2024-04-29 LAB — RESP PANEL BY RT-PCR (RSV, FLU A&B, COVID)  RVPGX2
Influenza A by PCR: NEGATIVE
Influenza B by PCR: NEGATIVE
Resp Syncytial Virus by PCR: NEGATIVE
SARS Coronavirus 2 by RT PCR: NEGATIVE

## 2024-04-29 MED ORDER — IPRATROPIUM-ALBUTEROL 0.5-2.5 (3) MG/3ML IN SOLN
3.0000 mL | Freq: Once | RESPIRATORY_TRACT | Status: AC
  Administered 2024-04-29: 3 mL via RESPIRATORY_TRACT
  Filled 2024-04-29: qty 3

## 2024-04-29 MED ORDER — HYDROCODONE-ACETAMINOPHEN 5-325 MG PO TABS
1.0000 | ORAL_TABLET | Freq: Once | ORAL | Status: AC
  Administered 2024-04-29: 1 via ORAL
  Filled 2024-04-29: qty 1

## 2024-04-29 MED ORDER — KETOROLAC TROMETHAMINE 15 MG/ML IJ SOLN
15.0000 mg | Freq: Once | INTRAMUSCULAR | Status: AC
  Administered 2024-04-29: 15 mg via INTRAVENOUS
  Filled 2024-04-29: qty 1

## 2024-04-29 MED ORDER — KETOROLAC TROMETHAMINE 15 MG/ML IJ SOLN: 15.0000 mg | Freq: Once | INTRAMUSCULAR | Status: DC

## 2024-04-29 NOTE — ED Triage Notes (Addendum)
 Patient report sharp and tingling sensation on her left hand x 1 week. Patient report taking OTC medication with no relief. Patient denies any previous injury or fall.  Patient report she's been coughing and felt SOB while on her way in the hospital. Patient denies Chest pain. Patient denies N/V.

## 2024-04-29 NOTE — ED Provider Notes (Signed)
 [place] EMERGENCY DEPARTMENT AT [facility] Provider Note   CSN: [id number] Arrival date & time: 04/29/24  9948     Patient presents with: Shortness of Breath and Hand Pain   Brooke [name] is a 68 y.o. female with medical history of anxiety, depression, tobacco abuse, chronic pain syndrome, CVA, chronic back pain.  Presents to ED complaining of left hand pain as well as shortness of breath.  States left hand pain has been going on for 1 month.  Reports that she had left hand pain back in 2023 which resolved on its own.  States that for the last 1 months she has had increased left hand pain.  Denies any falls or trauma.  States that she believes that she has carpal tunnel syndrome.  Reports that her pain is primarily in her left palm.  She is also here stating that she has shortness of breath.  Reports that her shortness of breath has been ongoing for the last 2 hours and was primarily noticed on her way to the hospital.  She denies any chest pain.  She denies any nausea, vomiting or diarrhea.  She denies abdominal pain.  She reports that she takes blood thinning medication and is compliant.   Shortness of Breath Hand Pain Associated symptoms include shortness of breath.       Prior to Admission medications   Medication Sig Start Date End Date Taking? Authorizing Provider  acetaminophen  (TYLENOL ) 500 MG tablet Take 1,000 mg by mouth every 8 (eight) hours as needed for moderate pain.  10/20/17   [provider]  albuterol  (PROVENTIL  HFA;VENTOLIN  HFA) 108 (90 Base) MCG/ACT inhaler Inhale 2 puffs into the lungs every 6 (six) hours as needed for wheezing or shortness of breath (shortness of breath). 10/29/15   Newlin, Enobong, MD  albuterol  (PROVENTIL ) (2.5 MG/3ML) 0.083% nebulizer solution Take 3 mLs (2.5 mg total) by nebulization every 6 (six) hours as needed for wheezing or shortness of breath. 11/10/17   Kirichenko, Tatyana, PA-C  albuterol  (PROVENTIL ) (2.5 MG/3ML)  0.083% nebulizer solution Take 3 mLs (2.5 mg total) by nebulization every 4 (four) hours as needed for wheezing or shortness of breath. 06/09/23   Haze Lonni JINNY, MD  albuterol  (VENTOLIN  HFA) 108 (90 Base) MCG/ACT inhaler Inhale 2 puffs into the lungs every 4 (four) hours as needed for wheezing or shortness of breath. 06/09/23   Haze Lonni JINNY, MD  amitriptyline  (ELAVIL ) 50 MG tablet Take 50 mg by mouth at bedtime.    [provider]  atorvastatin  (LIPITOR) 40 MG tablet TAKE 1 TABLET DAILY AT 6 PM 01/22/16   Newlin, Enobong, MD  Cholecalciferol (VITAMIN D3) 5000 units CAPS Take 5,000 Units by mouth daily. 12/29/17   [provider]  clindamycin  (CLEOCIN ) 150 MG capsule Take 1 capsule (150 mg total) by mouth every 6 (six) hours. 01/25/24   Soto, Johana, PA-C  doxycycline  (VIBRAMYCIN ) 100 MG capsule Take 1 capsule (100 mg total) by mouth 2 (two) times daily. 06/09/23   Haze Lonni JINNY, MD  gabapentin  (NEURONTIN ) 600 MG tablet Take 600 mg by mouth 2 (two) times daily.  09/29/17   [provider]  hydrochlorothiazide  (HYDRODIURIL ) 12.5 MG tablet Take 12.5 mg by mouth daily. 02/24/17   [provider]  HYDROcodone -acetaminophen  (NORCO/VICODIN) 5-325 MG tablet Take 1 tablet by mouth every 6 (six) hours as needed for severe pain. 01/04/18   Garrick Charleston, MD  levETIRAcetam  (KEPPRA ) 500 MG tablet TAKE 1 TABLET TWICE DAILY 01/22/16  Newlin, Enobong, MD  omeprazole  (PRILOSEC) 40 MG capsule TAKE 1 CAPSULE EVERY DAY 09/17/15   Newlin, Enobong, MD  oxyCODONE -acetaminophen  (PERCOCET) 7.5-325 MG tablet Take 1 tablet by mouth every 8 (eight) hours as needed for severe pain.    [provider]  oxyCODONE -acetaminophen  (PERCOCET/ROXICET) 5-325 MG tablet Take 1 tablet by mouth every 4 (four) hours as needed for severe pain. 07/24/21   Tegeler, Lonni PARAS, MD  predniSONE  (DELTASONE ) 20 MG tablet Take 2 tablets (40 mg total) by mouth daily with breakfast.  06/09/23   Pollina, Lonni PARAS, MD  rivaroxaban  (XARELTO ) 20 MG TABS tablet TAKE ONE TABLET BY MOUTH ONCE DAILY IN THE EVENING WITH SUPPER 10/29/15   Newlin, Enobong, MD    Allergies: Kiwi extract and Aspirin     Review of Systems  Respiratory:  Positive for shortness of breath.   Musculoskeletal:  Positive for myalgias.  All other systems reviewed and are negative.   Updated Vital Signs BP (!) 166/78   Pulse 70   Temp 97.8 F (36.6 C) (Oral)   Resp 13   LMP 05/24/2003   SpO2 98%   Physical Exam Vitals and nursing note reviewed.  Constitutional:      General: She is not in acute distress.    Appearance: She is well-developed.  HENT:     Head: Normocephalic and atraumatic.  Eyes:     Conjunctiva/sclera: Conjunctivae normal.  Cardiovascular:     Rate and Rhythm: Normal rate and regular rhythm.     Heart sounds: No murmur heard. Pulmonary:     Effort: Pulmonary effort is normal. No respiratory distress.     Breath sounds: Normal breath sounds.  Abdominal:     Palpations: Abdomen is soft.     Tenderness: There is no abdominal tenderness.  Musculoskeletal:        General: No swelling.     Cervical back: Neck supple.     Right lower leg: No edema.     Left lower leg: No edema.     Comments: Patient left wrist without deformity.  No overlying skin change such as erythema or warmth.  Full ROM of left wrist.  No snuffbox tenderness.  2+ radial pulse.  Skin:    General: Skin is warm and dry.     Capillary Refill: Capillary refill takes less than 2 seconds.  Neurological:     Mental Status: She is alert.  Psychiatric:        Mood and Affect: Mood normal.     (all labs ordered are listed, but only abnormal results are displayed) Labs Reviewed  BASIC METABOLIC PANEL WITH GFR - Abnormal; Notable for the following components:      Result Value   Glucose, Bld 101 (*)    Calcium  8.6 (*)    All other components within normal limits  CBC - Abnormal; Notable for the  following components:   RBC 3.75 (*)    MCV 102.9 (*)    All other components within normal limits  RESP PANEL BY RT-PCR (RSV, FLU A&B, COVID)  [id number]  PRO BRAIN NATRIURETIC PEPTIDE    EKG: EKG Interpretation Date/Time:  Friday April 29 2024 01:01:51 EST Ventricular Rate:  81 PR Interval:  139 QRS Duration:  119 QT Interval:  398 QTC Calculation: 462 R Axis:   55  Text Interpretation: Sinus rhythm Premature ventricular complexes Low voltage, extremity and precordial leads Confirmed by Palumbo, April ([id number]) on 04/29/2024 1:18:47 AM  Radiology: [facility] Hand Complete Left Result Date:  04/29/2024 EXAM: 3 or more VIEW(S) XRAY OF THE LEFT HAND 04/29/2024 02:23:00 AM COMPARISON: None available. CLINICAL HISTORY: Left hand pain FINDINGS: BONES AND JOINTS: Joint space narrowing with marginal osteophytes of the distal interphalangeal joints and thumb interphalangeal joint. Degenerative changes of the thumb carpometacarpal and triscaphe joints. Radiocarpal joint degenerative changes with chondrocalcinosis along the radial styloid. SOFT TISSUES: The soft tissues are unremarkable. IMPRESSION: 1. Joint space narrowing with marginal osteophytes of the distal interphalangeal joints and thumb interphalangeal joint. 2. Degenerative changes of the thumb carpometacarpal and triscaphe joints. 3. Radiocarpal joint degenerative changes with chondrocalcinosis along the radial styloid. Electronically signed by: Dorethia Molt MD 04/29/2024 02:58 AM EST RP Workstation: [id number]   DG Chest 2 View Result Date: 04/29/2024 EXAM: 2 VIEW(S) XRAY OF THE CHEST 04/29/2024 01:35:46 AM COMPARISON: Chest x-ray 12/05/2023, CT chest 1716. CLINICAL HISTORY: SOB. FINDINGS: LIMITATIONS/ARTIFACTS: Patient is rotated. LUNGS AND PLEURA: No focal pulmonary opacity. No pleural effusion. No pneumothorax. HEART AND MEDIASTINUM: Cardiomegaly, stable. BONES AND SOFT TISSUES: No displaced rib fractures. Severe degenerative changes of  bilateral shoulders. IMPRESSION: 1. No acute cardiopulmonary process. 2. Cardiomegaly, stable. Electronically signed by: Morgane Naveau MD 04/29/2024 01:56 AM EST RP Workstation: [id number]    Procedures   Medications Ordered in the ED  HYDROcodone -acetaminophen  (NORCO/VICODIN) 5-325 MG per tablet 1 tablet (has no administration in time range)  ipratropium-albuterol  (DUONEB) 0.5-2.5 (3) MG/3ML nebulizer solution 3 mL (3 mLs Nebulization Given 04/29/24 0250)  ketorolac  (TORADOL ) 15 MG/ML injection 15 mg (15 mg Intravenous Given 04/29/24 0341)    Medical Decision Making Amount and/or Complexity of Data Reviewed Labs: ordered. Radiology: ordered.  Risk Prescription drug management.   68 year old female presenting to the ED complaining of left wrist pain as well as shortness of breath noticed on the way to the hospital.  On exam, she is HD stable.  Afebrile and nontachycardic.  Lung sounds clear bilaterally, no hypoxia.  Abdomen soft and compressible.  Neuroexam at baseline.  Left wrist without deformity.  No overlying skin change, no erythema or warmth.  Full ROM of left wrist.  No snuffbox tenderness.  Brisk cap refill.  2+ radial pulse.  Patient assessed with left wrist x-ray.  Chest x-ray.  CBC, BMP, troponin x 2, EKG, BNP.  Patient CBC without leukocytosis or anemia.  Metabolic panel is grossly unremarkable.  Viral panel negative for all.  BNP not elevated at 218.0.  Chest x-ray unremarkable without effusions.  Left wrist x-ray shows degenerative changes.  At this time, feel patient left hand pain due to degenerative changes.  For patient shortness of breath, she is not hypoxic here.  Her workup is unremarkable.  She reports that she is compliant on her blood thinning medication, she is not tachycardic here so I doubt PE. Patient advised to follow-up outpatient with her PCP.  She was given return precautions and she voiced understanding.  Stable to discharge.    Final diagnoses:   Left wrist pain  Shortness of breath    ED Discharge Orders     None          Mariette Cowley F, PA-C 04/29/24 0604    Palumbo, April, MD 04/29/24 [phone]

## 2024-04-29 NOTE — Discharge Instructions (Signed)
 As we discussed, your workup here is reassuring.  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  Please follow-up patient with your PCP.  Return to the ED with new symptoms.
